# Patient Record
Sex: Female | Born: 1935 | Race: White | Hispanic: No | Smoking: Former smoker
Health system: Southern US, Community
[De-identification: ages and names within clinical notes are randomized; demographics above are authoritative.]

## PROBLEM LIST (undated history)

## (undated) DIAGNOSIS — Z8673 Personal history of transient ischemic attack (TIA), and cerebral infarction without residual deficits: Secondary | ICD-10-CM

## (undated) DIAGNOSIS — I6529 Occlusion and stenosis of unspecified carotid artery: Secondary | ICD-10-CM

## (undated) DIAGNOSIS — K219 Gastro-esophageal reflux disease without esophagitis: Secondary | ICD-10-CM

## (undated) DIAGNOSIS — E785 Hyperlipidemia, unspecified: Secondary | ICD-10-CM

## (undated) DIAGNOSIS — D649 Anemia, unspecified: Secondary | ICD-10-CM

## (undated) DIAGNOSIS — I2510 Atherosclerotic heart disease of native coronary artery without angina pectoris: Secondary | ICD-10-CM

## (undated) DIAGNOSIS — Z952 Presence of prosthetic heart valve: Secondary | ICD-10-CM

## (undated) DIAGNOSIS — I5030 Unspecified diastolic (congestive) heart failure: Secondary | ICD-10-CM

## (undated) DIAGNOSIS — E119 Type 2 diabetes mellitus without complications: Secondary | ICD-10-CM

## (undated) DIAGNOSIS — Z8719 Personal history of other diseases of the digestive system: Secondary | ICD-10-CM

## (undated) DIAGNOSIS — N189 Chronic kidney disease, unspecified: Secondary | ICD-10-CM

## (undated) DIAGNOSIS — I10 Essential (primary) hypertension: Secondary | ICD-10-CM

## (undated) DIAGNOSIS — I739 Peripheral vascular disease, unspecified: Secondary | ICD-10-CM

## (undated) DIAGNOSIS — M797 Fibromyalgia: Secondary | ICD-10-CM

## (undated) DIAGNOSIS — E669 Obesity, unspecified: Secondary | ICD-10-CM

## (undated) DIAGNOSIS — I480 Paroxysmal atrial fibrillation: Secondary | ICD-10-CM

## (undated) HISTORY — DX: Occlusion and stenosis of unspecified carotid artery: I65.29

## (undated) HISTORY — PX: CATARACT EXTRACTION W/ INTRAOCULAR LENS  IMPLANT, BILATERAL: SHX1307

## (undated) HISTORY — DX: Peripheral vascular disease, unspecified: I73.9

## (undated) HISTORY — DX: Obesity, unspecified: E66.9

## (undated) HISTORY — PX: CARPAL TUNNEL RELEASE: SHX101

## (undated) HISTORY — DX: Atherosclerotic heart disease of native coronary artery without angina pectoris: I25.10

## (undated) HISTORY — PX: EYE SURGERY: SHX253

## (undated) HISTORY — DX: Hyperlipidemia, unspecified: E78.5

## (undated) HISTORY — DX: Essential (primary) hypertension: I10

---

## 1994-04-19 HISTORY — PX: TRIGGER FINGER RELEASE: SHX641

## 1997-11-06 ENCOUNTER — Other Ambulatory Visit: Admission: RE | Admit: 1997-11-06 | Discharge: 1997-11-06 | Payer: Self-pay | Admitting: Family Medicine

## 1997-12-25 ENCOUNTER — Ambulatory Visit (HOSPITAL_COMMUNITY): Admission: RE | Admit: 1997-12-25 | Discharge: 1997-12-25 | Payer: Self-pay | Admitting: Family Medicine

## 1997-12-25 ENCOUNTER — Encounter: Payer: Self-pay | Admitting: Family Medicine

## 1998-04-14 ENCOUNTER — Ambulatory Visit (HOSPITAL_COMMUNITY): Admission: RE | Admit: 1998-04-14 | Discharge: 1998-04-14 | Payer: Self-pay | Admitting: Family Medicine

## 1998-04-19 HISTORY — PX: CORONARY ARTERY BYPASS GRAFT: SHX141

## 1998-10-23 ENCOUNTER — Other Ambulatory Visit: Admission: RE | Admit: 1998-10-23 | Discharge: 1998-10-23 | Payer: Self-pay | Admitting: Family Medicine

## 1998-11-06 ENCOUNTER — Ambulatory Visit (HOSPITAL_COMMUNITY): Admission: RE | Admit: 1998-11-06 | Discharge: 1998-11-06 | Payer: Self-pay | Admitting: Internal Medicine

## 1998-11-13 ENCOUNTER — Ambulatory Visit (HOSPITAL_COMMUNITY): Admission: RE | Admit: 1998-11-13 | Discharge: 1998-11-13 | Payer: Self-pay | Admitting: Cardiology

## 1998-11-20 ENCOUNTER — Inpatient Hospital Stay (HOSPITAL_COMMUNITY): Admission: RE | Admit: 1998-11-20 | Discharge: 1998-11-27 | Payer: Self-pay | Admitting: Surgery

## 1998-11-20 DIAGNOSIS — Z951 Presence of aortocoronary bypass graft: Secondary | ICD-10-CM

## 1998-11-21 ENCOUNTER — Encounter: Payer: Self-pay | Admitting: Surgery

## 1998-11-22 ENCOUNTER — Encounter: Payer: Self-pay | Admitting: Surgery

## 1999-01-06 ENCOUNTER — Encounter (HOSPITAL_COMMUNITY): Admission: RE | Admit: 1999-01-06 | Discharge: 1999-04-06 | Payer: Self-pay | Admitting: Cardiology

## 1999-11-09 ENCOUNTER — Ambulatory Visit (HOSPITAL_COMMUNITY): Admission: RE | Admit: 1999-11-09 | Discharge: 1999-11-09 | Payer: Self-pay | Admitting: *Deleted

## 2000-02-05 ENCOUNTER — Encounter: Payer: Self-pay | Admitting: Gastroenterology

## 2000-02-05 ENCOUNTER — Ambulatory Visit (HOSPITAL_COMMUNITY): Admission: RE | Admit: 2000-02-05 | Discharge: 2000-02-05 | Payer: Self-pay | Admitting: Gastroenterology

## 2000-11-29 ENCOUNTER — Other Ambulatory Visit: Admission: RE | Admit: 2000-11-29 | Discharge: 2000-11-29 | Payer: Self-pay | Admitting: Family Medicine

## 2000-12-27 ENCOUNTER — Ambulatory Visit (HOSPITAL_COMMUNITY): Admission: RE | Admit: 2000-12-27 | Discharge: 2000-12-27 | Payer: Self-pay | Admitting: Family Medicine

## 2001-01-25 ENCOUNTER — Ambulatory Visit (HOSPITAL_BASED_OUTPATIENT_CLINIC_OR_DEPARTMENT_OTHER): Admission: RE | Admit: 2001-01-25 | Discharge: 2001-01-25 | Payer: Self-pay | Admitting: Otolaryngology

## 2001-05-05 ENCOUNTER — Ambulatory Visit (HOSPITAL_COMMUNITY): Admission: RE | Admit: 2001-05-05 | Discharge: 2001-05-05 | Payer: Self-pay | Admitting: Family Medicine

## 2001-05-05 ENCOUNTER — Encounter: Payer: Self-pay | Admitting: Family Medicine

## 2001-10-03 ENCOUNTER — Ambulatory Visit (HOSPITAL_COMMUNITY): Admission: RE | Admit: 2001-10-03 | Discharge: 2001-10-03 | Payer: Self-pay | Admitting: Family Medicine

## 2001-10-03 ENCOUNTER — Emergency Department (HOSPITAL_COMMUNITY): Admission: EM | Admit: 2001-10-03 | Discharge: 2001-10-03 | Payer: Self-pay | Admitting: Emergency Medicine

## 2001-10-03 ENCOUNTER — Encounter: Payer: Self-pay | Admitting: Family Medicine

## 2001-11-30 ENCOUNTER — Encounter: Payer: Self-pay | Admitting: Family Medicine

## 2001-11-30 ENCOUNTER — Ambulatory Visit (HOSPITAL_COMMUNITY): Admission: RE | Admit: 2001-11-30 | Discharge: 2001-11-30 | Payer: Self-pay | Admitting: Family Medicine

## 2002-03-05 ENCOUNTER — Ambulatory Visit (HOSPITAL_COMMUNITY): Admission: RE | Admit: 2002-03-05 | Discharge: 2002-03-05 | Payer: Self-pay | Admitting: Internal Medicine

## 2002-11-28 ENCOUNTER — Other Ambulatory Visit: Admission: RE | Admit: 2002-11-28 | Discharge: 2002-11-28 | Payer: Self-pay | Admitting: Family Medicine

## 2003-03-07 ENCOUNTER — Ambulatory Visit (HOSPITAL_COMMUNITY): Admission: RE | Admit: 2003-03-07 | Discharge: 2003-03-07 | Payer: Self-pay | Admitting: Family Medicine

## 2003-12-30 ENCOUNTER — Ambulatory Visit: Payer: Self-pay | Admitting: Nurse Practitioner

## 2004-01-06 ENCOUNTER — Ambulatory Visit: Payer: Self-pay | Admitting: Nurse Practitioner

## 2004-01-29 ENCOUNTER — Ambulatory Visit: Payer: Self-pay | Admitting: Nurse Practitioner

## 2004-04-17 ENCOUNTER — Ambulatory Visit: Payer: Self-pay | Admitting: Nurse Practitioner

## 2004-04-29 ENCOUNTER — Ambulatory Visit (HOSPITAL_COMMUNITY): Admission: RE | Admit: 2004-04-29 | Discharge: 2004-04-29 | Payer: Self-pay | Admitting: Family Medicine

## 2004-05-11 ENCOUNTER — Ambulatory Visit: Payer: Self-pay | Admitting: Nurse Practitioner

## 2004-05-22 ENCOUNTER — Ambulatory Visit: Payer: Self-pay | Admitting: Nurse Practitioner

## 2004-08-11 ENCOUNTER — Ambulatory Visit: Payer: Self-pay | Admitting: Nurse Practitioner

## 2004-09-18 ENCOUNTER — Ambulatory Visit: Payer: Self-pay | Admitting: Nurse Practitioner

## 2004-12-14 ENCOUNTER — Ambulatory Visit (HOSPITAL_COMMUNITY): Admission: RE | Admit: 2004-12-14 | Discharge: 2004-12-14 | Payer: Self-pay | Admitting: Nurse Practitioner

## 2004-12-14 ENCOUNTER — Ambulatory Visit: Payer: Self-pay | Admitting: Nurse Practitioner

## 2004-12-25 ENCOUNTER — Ambulatory Visit: Payer: Self-pay | Admitting: Nurse Practitioner

## 2005-03-24 ENCOUNTER — Ambulatory Visit: Payer: Self-pay | Admitting: Nurse Practitioner

## 2005-04-30 ENCOUNTER — Ambulatory Visit: Payer: Self-pay | Admitting: Nurse Practitioner

## 2005-05-24 ENCOUNTER — Ambulatory Visit: Payer: Self-pay | Admitting: Nurse Practitioner

## 2005-06-07 ENCOUNTER — Ambulatory Visit (HOSPITAL_COMMUNITY): Admission: RE | Admit: 2005-06-07 | Discharge: 2005-06-07 | Payer: Self-pay | Admitting: Family Medicine

## 2005-06-22 ENCOUNTER — Ambulatory Visit: Payer: Self-pay | Admitting: Nurse Practitioner

## 2005-07-28 ENCOUNTER — Ambulatory Visit: Payer: Self-pay | Admitting: Nurse Practitioner

## 2005-09-28 ENCOUNTER — Ambulatory Visit: Payer: Self-pay | Admitting: Nurse Practitioner

## 2005-11-03 ENCOUNTER — Emergency Department (HOSPITAL_COMMUNITY): Admission: EM | Admit: 2005-11-03 | Discharge: 2005-11-03 | Payer: Self-pay | Admitting: Emergency Medicine

## 2005-12-28 ENCOUNTER — Ambulatory Visit: Payer: Self-pay | Admitting: Nurse Practitioner

## 2006-04-20 ENCOUNTER — Ambulatory Visit: Payer: Self-pay | Admitting: Nurse Practitioner

## 2006-05-26 ENCOUNTER — Ambulatory Visit (HOSPITAL_COMMUNITY): Admission: RE | Admit: 2006-05-26 | Discharge: 2006-05-26 | Payer: Self-pay | Admitting: Nurse Practitioner

## 2006-05-26 ENCOUNTER — Ambulatory Visit: Payer: Self-pay | Admitting: Nurse Practitioner

## 2006-12-29 ENCOUNTER — Ambulatory Visit: Payer: Self-pay | Admitting: Internal Medicine

## 2006-12-29 LAB — CONVERTED CEMR LAB
ALT: 37 units/L — ABNORMAL HIGH (ref 0–35)
AST: 43 units/L — ABNORMAL HIGH (ref 0–37)
Albumin: 4.2 g/dL (ref 3.5–5.2)
Alkaline Phosphatase: 121 units/L — ABNORMAL HIGH (ref 39–117)
BUN: 25 mg/dL — ABNORMAL HIGH (ref 6–23)
Basophils Absolute: 0.1 10*3/uL (ref 0.0–0.1)
Basophils Relative: 1 % (ref 0–1)
CO2: 25 meq/L (ref 19–32)
Calcium: 9.5 mg/dL (ref 8.4–10.5)
Chloride: 99 meq/L (ref 96–112)
Creatinine, Ser: 1.01 mg/dL (ref 0.40–1.20)
Eosinophils Absolute: 0.3 10*3/uL (ref 0.0–0.7)
Eosinophils Relative: 4 % (ref 0–5)
Glucose, Bld: 227 mg/dL — ABNORMAL HIGH (ref 70–99)
HCT: 43.3 % (ref 36.0–46.0)
Hemoglobin: 15 g/dL (ref 12.0–15.0)
Lymphocytes Relative: 23 % (ref 12–46)
Lymphs Abs: 1.7 10*3/uL (ref 0.7–3.3)
MCHC: 34.6 g/dL (ref 30.0–36.0)
MCV: 94.5 fL (ref 78.0–100.0)
Monocytes Absolute: 0.7 10*3/uL (ref 0.2–0.7)
Monocytes Relative: 9 % (ref 3–11)
Neutro Abs: 4.8 10*3/uL (ref 1.7–7.7)
Neutrophils Relative %: 64 % (ref 43–77)
Platelets: 271 10*3/uL (ref 150–400)
Potassium: 4.6 meq/L (ref 3.5–5.3)
RBC: 4.58 M/uL (ref 3.87–5.11)
RDW: 13 % (ref 11.5–14.0)
Sodium: 135 meq/L (ref 135–145)
TSH: 2.146 microintl units/mL (ref 0.350–5.50)
Total Bilirubin: 0.8 mg/dL (ref 0.3–1.2)
Total Protein: 8.1 g/dL (ref 6.0–8.3)
WBC: 7.5 10*3/uL (ref 4.0–10.5)

## 2007-01-06 ENCOUNTER — Ambulatory Visit: Payer: Self-pay | Admitting: Family Medicine

## 2007-02-27 ENCOUNTER — Ambulatory Visit: Payer: Self-pay | Admitting: Family Medicine

## 2007-02-28 ENCOUNTER — Ambulatory Visit (HOSPITAL_COMMUNITY): Admission: RE | Admit: 2007-02-28 | Discharge: 2007-02-28 | Payer: Self-pay | Admitting: Family Medicine

## 2007-03-22 ENCOUNTER — Ambulatory Visit: Payer: Self-pay | Admitting: Family Medicine

## 2007-03-24 ENCOUNTER — Ambulatory Visit: Payer: Self-pay | Admitting: Internal Medicine

## 2007-04-18 ENCOUNTER — Ambulatory Visit: Payer: Self-pay | Admitting: Internal Medicine

## 2007-07-07 ENCOUNTER — Ambulatory Visit: Payer: Self-pay | Admitting: Family Medicine

## 2007-07-18 ENCOUNTER — Ambulatory Visit: Payer: Self-pay | Admitting: Internal Medicine

## 2007-08-03 ENCOUNTER — Ambulatory Visit: Payer: Self-pay | Admitting: Internal Medicine

## 2008-02-01 ENCOUNTER — Encounter (INDEPENDENT_AMBULATORY_CARE_PROVIDER_SITE_OTHER): Payer: Self-pay | Admitting: Internal Medicine

## 2008-02-01 ENCOUNTER — Ambulatory Visit: Payer: Self-pay | Admitting: Internal Medicine

## 2008-02-01 LAB — CONVERTED CEMR LAB
ALT: 31 units/L (ref 0–35)
AST: 39 units/L — ABNORMAL HIGH (ref 0–37)
Albumin: 3.8 g/dL (ref 3.5–5.2)
Alkaline Phosphatase: 90 units/L (ref 39–117)
BUN: 22 mg/dL (ref 6–23)
CO2: 27 meq/L (ref 19–32)
Calcium: 9.4 mg/dL (ref 8.4–10.5)
Chloride: 104 meq/L (ref 96–112)
Creatinine, Ser: 0.84 mg/dL (ref 0.40–1.20)
Glucose, Bld: 90 mg/dL (ref 70–99)
Microalb, Ur: 0.3 mg/dL (ref 0.00–1.89)
Potassium: 4.5 meq/L (ref 3.5–5.3)
Sodium: 139 meq/L (ref 135–145)
Total Bilirubin: 0.6 mg/dL (ref 0.3–1.2)
Total Protein: 7.6 g/dL (ref 6.0–8.3)

## 2008-02-02 ENCOUNTER — Encounter (INDEPENDENT_AMBULATORY_CARE_PROVIDER_SITE_OTHER): Payer: Self-pay | Admitting: Internal Medicine

## 2008-02-13 ENCOUNTER — Ambulatory Visit: Payer: Self-pay | Admitting: Internal Medicine

## 2008-03-22 ENCOUNTER — Ambulatory Visit: Payer: Self-pay | Admitting: Family Medicine

## 2008-04-03 ENCOUNTER — Ambulatory Visit: Payer: Self-pay | Admitting: Internal Medicine

## 2008-04-26 ENCOUNTER — Ambulatory Visit: Payer: Self-pay | Admitting: Internal Medicine

## 2008-06-03 ENCOUNTER — Ambulatory Visit: Payer: Self-pay | Admitting: Internal Medicine

## 2008-08-06 ENCOUNTER — Ambulatory Visit: Payer: Self-pay | Admitting: Internal Medicine

## 2008-08-07 ENCOUNTER — Encounter (INDEPENDENT_AMBULATORY_CARE_PROVIDER_SITE_OTHER): Payer: Self-pay | Admitting: Internal Medicine

## 2008-08-09 ENCOUNTER — Encounter: Admission: RE | Admit: 2008-08-09 | Discharge: 2008-08-09 | Payer: Self-pay | Admitting: Cardiology

## 2008-08-13 ENCOUNTER — Ambulatory Visit (HOSPITAL_COMMUNITY): Admission: RE | Admit: 2008-08-13 | Discharge: 2008-08-13 | Payer: Self-pay | Admitting: Internal Medicine

## 2008-08-20 ENCOUNTER — Ambulatory Visit: Payer: Self-pay | Admitting: Internal Medicine

## 2008-09-23 ENCOUNTER — Encounter: Payer: Self-pay | Admitting: Internal Medicine

## 2008-09-23 ENCOUNTER — Ambulatory Visit (HOSPITAL_COMMUNITY): Admission: RE | Admit: 2008-09-23 | Discharge: 2008-09-23 | Payer: Self-pay | Admitting: Internal Medicine

## 2008-09-23 ENCOUNTER — Ambulatory Visit: Payer: Self-pay | Admitting: Internal Medicine

## 2008-09-23 DIAGNOSIS — M79609 Pain in unspecified limb: Secondary | ICD-10-CM

## 2008-09-23 LAB — CONVERTED CEMR LAB: Blood Glucose, Fingerstick: 253

## 2008-10-18 ENCOUNTER — Ambulatory Visit: Payer: Self-pay | Admitting: Internal Medicine

## 2008-10-18 ENCOUNTER — Encounter (INDEPENDENT_AMBULATORY_CARE_PROVIDER_SITE_OTHER): Payer: Self-pay | Admitting: Internal Medicine

## 2008-12-05 ENCOUNTER — Encounter (INDEPENDENT_AMBULATORY_CARE_PROVIDER_SITE_OTHER): Payer: Self-pay | Admitting: Internal Medicine

## 2008-12-05 ENCOUNTER — Ambulatory Visit: Payer: Self-pay | Admitting: Internal Medicine

## 2008-12-05 LAB — CONVERTED CEMR LAB
Albumin: 3.9 g/dL (ref 3.5–5.2)
BUN: 15 mg/dL (ref 6–23)
CO2: 26 meq/L (ref 19–32)
Glucose, Bld: 87 mg/dL (ref 70–99)
Sodium: 141 meq/L (ref 135–145)
Total Bilirubin: 0.6 mg/dL (ref 0.3–1.2)
Total Protein: 7.5 g/dL (ref 6.0–8.3)

## 2008-12-09 ENCOUNTER — Telehealth (INDEPENDENT_AMBULATORY_CARE_PROVIDER_SITE_OTHER): Payer: Self-pay | Admitting: *Deleted

## 2008-12-11 ENCOUNTER — Ambulatory Visit: Payer: Self-pay | Admitting: Internal Medicine

## 2008-12-24 ENCOUNTER — Ambulatory Visit: Payer: Self-pay | Admitting: Internal Medicine

## 2009-01-07 ENCOUNTER — Ambulatory Visit: Payer: Self-pay | Admitting: Internal Medicine

## 2009-01-21 ENCOUNTER — Ambulatory Visit: Payer: Self-pay | Admitting: Internal Medicine

## 2009-02-11 ENCOUNTER — Encounter (INDEPENDENT_AMBULATORY_CARE_PROVIDER_SITE_OTHER): Payer: Self-pay | Admitting: Internal Medicine

## 2009-02-11 ENCOUNTER — Ambulatory Visit: Payer: Self-pay | Admitting: Internal Medicine

## 2009-02-11 LAB — CONVERTED CEMR LAB: Microalb, Ur: 0.54 mg/dL (ref 0.00–1.89)

## 2009-02-12 ENCOUNTER — Encounter (INDEPENDENT_AMBULATORY_CARE_PROVIDER_SITE_OTHER): Payer: Self-pay | Admitting: Internal Medicine

## 2009-03-11 ENCOUNTER — Ambulatory Visit: Payer: Self-pay | Admitting: Internal Medicine

## 2009-04-06 ENCOUNTER — Emergency Department (HOSPITAL_COMMUNITY): Admission: EM | Admit: 2009-04-06 | Discharge: 2009-04-06 | Payer: Self-pay | Admitting: Emergency Medicine

## 2009-04-21 ENCOUNTER — Inpatient Hospital Stay (HOSPITAL_COMMUNITY): Admission: EM | Admit: 2009-04-21 | Discharge: 2009-04-26 | Payer: Self-pay | Admitting: Emergency Medicine

## 2009-04-21 DIAGNOSIS — Z8719 Personal history of other diseases of the digestive system: Secondary | ICD-10-CM

## 2009-05-14 ENCOUNTER — Ambulatory Visit: Payer: Self-pay | Admitting: Internal Medicine

## 2009-05-20 ENCOUNTER — Ambulatory Visit: Payer: Self-pay | Admitting: Internal Medicine

## 2009-05-20 ENCOUNTER — Encounter (INDEPENDENT_AMBULATORY_CARE_PROVIDER_SITE_OTHER): Payer: Self-pay | Admitting: Adult Health

## 2009-05-20 ENCOUNTER — Encounter: Payer: Self-pay | Admitting: Internal Medicine

## 2009-05-20 ENCOUNTER — Ambulatory Visit (HOSPITAL_COMMUNITY): Admission: RE | Admit: 2009-05-20 | Discharge: 2009-05-20 | Payer: Self-pay | Admitting: Internal Medicine

## 2009-05-20 ENCOUNTER — Ambulatory Visit: Payer: Self-pay | Admitting: Vascular Surgery

## 2009-05-20 LAB — CONVERTED CEMR LAB
ALT: 14 units/L (ref 0–35)
AST: 21 units/L (ref 0–37)
Albumin: 3.5 g/dL (ref 3.5–5.2)
Calcium: 9.4 mg/dL (ref 8.4–10.5)
Chloride: 100 meq/L (ref 96–112)
HCT: 38.4 % (ref 36.0–46.0)
Lymphocytes Relative: 17 % (ref 12–46)
Lymphs Abs: 1.4 10*3/uL (ref 0.7–4.0)
Monocytes Relative: 8 % (ref 3–12)
Neutrophils Relative %: 70 % (ref 43–77)
Platelets: 406 10*3/uL — ABNORMAL HIGH (ref 150–400)
Potassium: 4.5 meq/L (ref 3.5–5.3)
RBC: 3.88 M/uL (ref 3.87–5.11)
Total Protein: 7 g/dL (ref 6.0–8.3)
WBC: 8.4 10*3/uL (ref 4.0–10.5)

## 2009-05-23 ENCOUNTER — Emergency Department (HOSPITAL_COMMUNITY): Admission: EM | Admit: 2009-05-23 | Discharge: 2009-05-23 | Payer: Self-pay | Admitting: Emergency Medicine

## 2009-06-24 ENCOUNTER — Ambulatory Visit: Payer: Self-pay | Admitting: Internal Medicine

## 2009-06-26 ENCOUNTER — Ambulatory Visit: Payer: Self-pay | Admitting: Vascular Surgery

## 2009-06-26 ENCOUNTER — Ambulatory Visit (HOSPITAL_COMMUNITY): Admission: RE | Admit: 2009-06-26 | Discharge: 2009-06-26 | Payer: Self-pay | Admitting: Internal Medicine

## 2009-06-26 ENCOUNTER — Encounter: Payer: Self-pay | Admitting: Internal Medicine

## 2009-07-18 ENCOUNTER — Ambulatory Visit: Payer: Self-pay | Admitting: Internal Medicine

## 2009-07-19 ENCOUNTER — Encounter (INDEPENDENT_AMBULATORY_CARE_PROVIDER_SITE_OTHER): Payer: Self-pay | Admitting: Adult Health

## 2009-08-06 ENCOUNTER — Ambulatory Visit: Payer: Self-pay | Admitting: Internal Medicine

## 2009-08-06 ENCOUNTER — Encounter (INDEPENDENT_AMBULATORY_CARE_PROVIDER_SITE_OTHER): Payer: Self-pay | Admitting: Adult Health

## 2009-08-06 ENCOUNTER — Other Ambulatory Visit: Admission: RE | Admit: 2009-08-06 | Discharge: 2009-08-06 | Payer: Self-pay | Admitting: Internal Medicine

## 2009-08-15 ENCOUNTER — Ambulatory Visit (HOSPITAL_COMMUNITY): Admission: RE | Admit: 2009-08-15 | Discharge: 2009-08-15 | Payer: Self-pay | Admitting: Internal Medicine

## 2009-08-17 ENCOUNTER — Emergency Department (HOSPITAL_COMMUNITY): Admission: EM | Admit: 2009-08-17 | Discharge: 2009-08-17 | Payer: Self-pay | Admitting: Emergency Medicine

## 2009-08-20 ENCOUNTER — Ambulatory Visit: Payer: Self-pay | Admitting: Vascular Surgery

## 2009-08-27 ENCOUNTER — Ambulatory Visit: Payer: Self-pay | Admitting: Internal Medicine

## 2009-08-28 ENCOUNTER — Encounter (INDEPENDENT_AMBULATORY_CARE_PROVIDER_SITE_OTHER): Payer: Self-pay | Admitting: Internal Medicine

## 2009-11-05 ENCOUNTER — Ambulatory Visit: Payer: Self-pay | Admitting: Internal Medicine

## 2009-11-05 ENCOUNTER — Encounter (INDEPENDENT_AMBULATORY_CARE_PROVIDER_SITE_OTHER): Payer: Self-pay | Admitting: Adult Health

## 2010-04-09 ENCOUNTER — Ambulatory Visit: Payer: Self-pay | Admitting: Vascular Surgery

## 2010-05-09 ENCOUNTER — Encounter: Payer: Self-pay | Admitting: Ophthalmology

## 2010-07-05 LAB — CBC
HCT: 23.5 % — ABNORMAL LOW (ref 36.0–46.0)
HCT: 24.5 % — ABNORMAL LOW (ref 36.0–46.0)
HCT: 24.6 % — ABNORMAL LOW (ref 36.0–46.0)
HCT: 24.7 % — ABNORMAL LOW (ref 36.0–46.0)
HCT: 24.9 % — ABNORMAL LOW (ref 36.0–46.0)
Hemoglobin: 7.1 g/dL — ABNORMAL LOW (ref 12.0–15.0)
Hemoglobin: 8 g/dL — ABNORMAL LOW (ref 12.0–15.0)
Hemoglobin: 8.4 g/dL — ABNORMAL LOW (ref 12.0–15.0)
Hemoglobin: 8.4 g/dL — ABNORMAL LOW (ref 12.0–15.0)
Hemoglobin: 8.5 g/dL — ABNORMAL LOW (ref 12.0–15.0)
Hemoglobin: 8.7 g/dL — ABNORMAL LOW (ref 12.0–15.0)
Hemoglobin: 9.1 g/dL — ABNORMAL LOW (ref 12.0–15.0)
MCHC: 33.9 g/dL (ref 30.0–36.0)
MCHC: 34.4 g/dL (ref 30.0–36.0)
MCHC: 34.4 g/dL (ref 30.0–36.0)
MCHC: 34.4 g/dL (ref 30.0–36.0)
MCHC: 34.5 g/dL (ref 30.0–36.0)
MCHC: 34.5 g/dL (ref 30.0–36.0)
MCHC: 34.5 g/dL (ref 30.0–36.0)
MCHC: 34.7 g/dL (ref 30.0–36.0)
MCHC: 34.9 g/dL (ref 30.0–36.0)
MCV: 93.5 fL (ref 78.0–100.0)
MCV: 94.9 fL (ref 78.0–100.0)
MCV: 95.3 fL (ref 78.0–100.0)
MCV: 95.6 fL (ref 78.0–100.0)
MCV: 95.6 fL (ref 78.0–100.0)
MCV: 95.8 fL (ref 78.0–100.0)
MCV: 96.4 fL (ref 78.0–100.0)
MCV: 97 fL (ref 78.0–100.0)
Platelets: 384 10*3/uL (ref 150–400)
Platelets: 397 K/uL (ref 150–400)
Platelets: 399 K/uL (ref 150–400)
Platelets: 405 K/uL — ABNORMAL HIGH (ref 150–400)
Platelets: 409 10*3/uL — ABNORMAL HIGH (ref 150–400)
Platelets: 422 K/uL — ABNORMAL HIGH (ref 150–400)
Platelets: 476 10*3/uL — ABNORMAL HIGH (ref 150–400)
RBC: 2.1 MIL/uL — ABNORMAL LOW (ref 3.87–5.11)
RBC: 2.42 MIL/uL — ABNORMAL LOW (ref 3.87–5.11)
RBC: 2.57 MIL/uL — ABNORMAL LOW (ref 3.87–5.11)
RBC: 2.6 MIL/uL — ABNORMAL LOW (ref 3.87–5.11)
RBC: 2.67 MIL/uL — ABNORMAL LOW (ref 3.87–5.11)
RBC: 2.73 MIL/uL — ABNORMAL LOW (ref 3.87–5.11)
RDW: 14.8 % (ref 11.5–15.5)
RDW: 14.9 % (ref 11.5–15.5)
RDW: 15 % (ref 11.5–15.5)
RDW: 15.4 % (ref 11.5–15.5)
RDW: 15.4 % (ref 11.5–15.5)
RDW: 17.5 % — ABNORMAL HIGH (ref 11.5–15.5)
WBC: 7.5 10*3/uL (ref 4.0–10.5)
WBC: 7.9 K/uL (ref 4.0–10.5)
WBC: 8.3 K/uL (ref 4.0–10.5)
WBC: 8.8 10*3/uL (ref 4.0–10.5)
WBC: 8.8 10*3/uL (ref 4.0–10.5)
WBC: 8.8 K/uL (ref 4.0–10.5)
WBC: 9.1 K/uL (ref 4.0–10.5)
WBC: 9.5 10*3/uL (ref 4.0–10.5)

## 2010-07-05 LAB — GLUCOSE, CAPILLARY
Glucose-Capillary: 101 mg/dL — ABNORMAL HIGH (ref 70–99)
Glucose-Capillary: 111 mg/dL — ABNORMAL HIGH (ref 70–99)
Glucose-Capillary: 123 mg/dL — ABNORMAL HIGH (ref 70–99)
Glucose-Capillary: 152 mg/dL — ABNORMAL HIGH (ref 70–99)
Glucose-Capillary: 152 mg/dL — ABNORMAL HIGH (ref 70–99)
Glucose-Capillary: 160 mg/dL — ABNORMAL HIGH (ref 70–99)
Glucose-Capillary: 170 mg/dL — ABNORMAL HIGH (ref 70–99)
Glucose-Capillary: 171 mg/dL — ABNORMAL HIGH (ref 70–99)
Glucose-Capillary: 180 mg/dL — ABNORMAL HIGH (ref 70–99)
Glucose-Capillary: 217 mg/dL — ABNORMAL HIGH (ref 70–99)
Glucose-Capillary: 220 mg/dL — ABNORMAL HIGH (ref 70–99)
Glucose-Capillary: 227 mg/dL — ABNORMAL HIGH (ref 70–99)
Glucose-Capillary: 274 mg/dL — ABNORMAL HIGH (ref 70–99)
Glucose-Capillary: 297 mg/dL — ABNORMAL HIGH (ref 70–99)
Glucose-Capillary: 74 mg/dL (ref 70–99)

## 2010-07-05 LAB — COMPREHENSIVE METABOLIC PANEL
AST: 29 U/L (ref 0–37)
Albumin: 2.8 g/dL — ABNORMAL LOW (ref 3.5–5.2)
Calcium: 8.9 mg/dL (ref 8.4–10.5)
Chloride: 102 mEq/L (ref 96–112)
Creatinine, Ser: 0.77 mg/dL (ref 0.4–1.2)
GFR calc Af Amer: >60 mL/min (ref >60)

## 2010-07-05 LAB — DIFFERENTIAL
Eosinophils Relative: 2 % (ref 0–5)
Lymphocytes Relative: 16 % (ref 12–46)
Lymphs Abs: 1.5 10*3/uL (ref 0.7–4.0)
Monocytes Absolute: 0.5 10*3/uL (ref 0.1–1.0)

## 2010-07-05 LAB — PROTIME-INR
INR: 1.4 (ref 0.00–1.49)
INR: 1.48 (ref 0.00–1.49)
Prothrombin Time: 17 s — ABNORMAL HIGH (ref 11.6–15.2)
Prothrombin Time: 17.8 seconds — ABNORMAL HIGH (ref 11.6–15.2)

## 2010-07-05 LAB — CROSSMATCH
ABO/RH(D): A POS
Antibody Screen: NEGATIVE

## 2010-07-05 LAB — BASIC METABOLIC PANEL
BUN: 23 mg/dL (ref 6–23)
CO2: 26 mEq/L (ref 19–32)
Calcium: 8.5 mg/dL (ref 8.4–10.5)
Calcium: 8.8 mg/dL (ref 8.4–10.5)
Chloride: 103 mEq/L (ref 96–112)
Chloride: 104 mEq/L (ref 96–112)
Creatinine, Ser: 0.81 mg/dL (ref 0.4–1.2)
GFR calc Af Amer: >60 mL/min (ref >60)
Sodium: 135 mEq/L (ref 135–145)

## 2010-07-05 LAB — PREPARE FRESH FROZEN PLASMA

## 2010-07-05 LAB — HEMOGLOBIN AND HEMATOCRIT, BLOOD: Hemoglobin: 8.6 g/dL — ABNORMAL LOW (ref 12.0–15.0)

## 2010-07-05 LAB — HEMOCCULT GUIAC POC 1CARD (OFFICE)
Fecal Occult Bld: POSITIVE
Fecal Occult Bld: POSITIVE

## 2010-07-05 LAB — BRAIN NATRIURETIC PEPTIDE: Pro B Natriuretic peptide (BNP): 89 pg/mL (ref 0.0–100.0)

## 2010-07-05 LAB — HEMOGLOBIN A1C: Mean Plasma Glucose: 169 mg/dL

## 2010-07-07 LAB — URINE CULTURE: Colony Count: >=100000

## 2010-07-07 LAB — URINALYSIS, ROUTINE W REFLEX MICROSCOPIC
Bilirubin Urine: NEGATIVE
Nitrite: POSITIVE — AB
Specific Gravity, Urine: 1.018 (ref 1.005–1.030)
pH: 6 (ref 5.0–8.0)

## 2010-07-07 LAB — URINE MICROSCOPIC-ADD ON

## 2010-07-07 LAB — GLUCOSE, CAPILLARY: Glucose-Capillary: 273 mg/dL — ABNORMAL HIGH (ref 70–99)

## 2010-07-14 ENCOUNTER — Emergency Department (HOSPITAL_COMMUNITY): Payer: Medicare Other

## 2010-07-14 ENCOUNTER — Emergency Department (HOSPITAL_COMMUNITY)
Admission: EM | Admit: 2010-07-14 | Discharge: 2010-07-14 | Disposition: A | Discharge status: Home / Self Care | Payer: Medicare Other | Attending: Emergency Medicine | Admitting: Emergency Medicine

## 2010-07-14 DIAGNOSIS — I252 Old myocardial infarction: Secondary | ICD-10-CM | POA: Insufficient documentation

## 2010-07-14 DIAGNOSIS — M5430 Sciatica, unspecified side: Secondary | ICD-10-CM | POA: Insufficient documentation

## 2010-07-14 DIAGNOSIS — I25810 Atherosclerosis of coronary artery bypass graft(s) without angina pectoris: Secondary | ICD-10-CM | POA: Insufficient documentation

## 2010-07-14 DIAGNOSIS — M25559 Pain in unspecified hip: Secondary | ICD-10-CM | POA: Insufficient documentation

## 2010-07-14 DIAGNOSIS — E785 Hyperlipidemia, unspecified: Secondary | ICD-10-CM | POA: Insufficient documentation

## 2010-07-14 DIAGNOSIS — E119 Type 2 diabetes mellitus without complications: Secondary | ICD-10-CM | POA: Insufficient documentation

## 2010-07-14 DIAGNOSIS — I10 Essential (primary) hypertension: Secondary | ICD-10-CM | POA: Insufficient documentation

## 2010-07-14 DIAGNOSIS — Z794 Long term (current) use of insulin: Secondary | ICD-10-CM | POA: Insufficient documentation

## 2010-07-20 LAB — CBC
HCT: 40.9 % (ref 36.0–46.0)
Hemoglobin: 14.1 g/dL (ref 12.0–15.0)
RDW: 12.9 % (ref 11.5–15.5)

## 2010-07-20 LAB — DIFFERENTIAL
Basophils Absolute: 0 10*3/uL (ref 0.0–0.1)
Eosinophils Relative: 3 % (ref 0–5)
Lymphocytes Relative: 13 % (ref 12–46)
Monocytes Absolute: 0.5 10*3/uL (ref 0.1–1.0)
Monocytes Relative: 7 % (ref 3–12)

## 2010-09-01 NOTE — Assessment & Plan Note (Signed)
OFFICE VISIT   Kristina Summers, Kristina Summers  DOB:  02/22/1936                                       08/20/2009  [id number]   The patient is a 75 year old female referred for lower extremity pain.  She states she has been having foot and ankle swelling in her right leg.  She apparently had a cellulitis in this area in March 2011.  This was  treated with antibiotics and has almost resolved at this point.  She has  had no prior episodes of cellulitis.  She also complains of pain in her  lower back which radiates to her right leg after walking approximately 5  minutes.  This has gotten worse over the last 6 months.  She has no  symptoms in her left lower extremity.  She denies any rest pain.  She  denies any ulcerations or nonhealing wounds on the foot.  States the  pain and fatigue is relieved with rest.  She also develops shortness of  breath about the time she develops lower extremity fatigue.   CHRONIC MEDICAL PROBLEMS:  Include diabetes, hypertension, coronary  artery disease, congestive heart failure, and elevated cholesterol.  She  also a history of atrial fibrillation.  These are all currently under  control.  She is followed by Dr. [name] and Dr. Jobe Igo.   PAST SURGICAL HISTORY:  She had coronary artery bypass grafting in 2000.   FAMILY HISTORY:  Remarkable for a brother who had a stroke in the past.   SOCIAL HISTORY:  She is divorced, has 3 children.  She is retired.  She  is a former smoker, quit 1992.  She does not consume alcohol regularly.   REVIEW OF SYSTEMS:  Full 12-point review of systems was performed with  the patient today.  Please see intake referral form for details  regarding this.   MEDICATIONS:  Include calcium, Micardis, omeprazole, oxycodone,  Macrobid, Lasix, Lipitor, aspirin, iron and Humulin insulin.  She was  given a refill for her Lasix prescription today.   PHYSICAL EXAMINATION:  Vital signs:  Blood pressure is 145/78 the left  arm, heart rate 86 and regular, temperature 98.  HEENT:  Unremarkable.  Neck:  Has 2+ carotid pulses without bruit.  Chest:  Clear to  auscultation.  Cardiac:  Exam is regular rate and rhythm without murmur.  Abdomen:  Soft, nontender, nondistended, slightly obese.  Extremities:  She has 2+ right radial pulse, she has a 1+ left radial pulse, she has  2+ femoral pulses bilaterally.  She has absent popliteal and pedal  pulses bilaterally.  Skin:  She still has some right calf erythema  around the area of her previous saphenectomy incision.  This is fairly  faint in appearance and the patient says it is remarkably improved.  Neurologic:  Exam shows no focal weakness or paresthesias.  Upper  extremity and lower extremity motor strength is 5/5 and symmetric.  Musculoskeletal:  Exam shows no obvious major joint deformities.  She  does have some tenderness in her shoulder on the right side and has a  known right rotator cuff tear.   She had bilateral ABIs performed at [facility] March 2011 that  showed an ABI on the right side of 0.8 and on the left of 0.6.  She had  monophasic waveforms bilaterally.   In summary, the patient has  right lower extremity pain.  This probably  multifactorial with degenerative joint disease as well as some chronic  back issues as well as some element of claudication.  Her ABIs on the  right side were 0.8 which suggests some occlusive disease.  Since her  waveforms are monophasic in character, most likely this represents some  element of tibial occlusive disease especially in light of fact that she  has diabetes.  The left ABI was slightly lower at 0.6 but she is  asymptomatic on this side.  I reassured her today that currently she is  not at risk of limb loss with this amount of perfusion to both legs.  I  encouraged her to try to walk 30 minutes daily, try to improve her  exercise tolerance and increase collateralization over time.  I do not  believe she  needs an intervention on her lower extremities at this point  as she is not really debilitated by her lower extremity symptoms and  most likely again these are multifactorial in nature.  I believe she  needs continued followup.  We will reschedule her for repeat ABIs in 6  months' time.  She will return sooner if she has any problems.     Jessy Oto. Fields, MD  Electronically Signed   CEF/MEDQ  D:  08/20/2009  T:  08/20/2009  Job:  [id number]   cc:   Monia Sabal. Jobe Igo, M.D.

## 2010-09-09 ENCOUNTER — Other Ambulatory Visit (HOSPITAL_COMMUNITY): Payer: Self-pay | Admitting: Family Medicine

## 2010-09-09 DIAGNOSIS — Z1231 Encounter for screening mammogram for malignant neoplasm of breast: Secondary | ICD-10-CM

## 2010-09-24 ENCOUNTER — Ambulatory Visit (HOSPITAL_COMMUNITY): Payer: Medicare Other

## 2010-10-07 ENCOUNTER — Ambulatory Visit (HOSPITAL_COMMUNITY)
Admission: RE | Admit: 2010-10-07 | Discharge: 2010-10-07 | Disposition: A | Discharge status: Home / Self Care | Payer: Medicare Other | Source: Ambulatory Visit | Attending: Family Medicine | Admitting: Family Medicine

## 2010-10-07 DIAGNOSIS — Z1231 Encounter for screening mammogram for malignant neoplasm of breast: Secondary | ICD-10-CM | POA: Insufficient documentation

## 2010-10-15 ENCOUNTER — Encounter (INDEPENDENT_AMBULATORY_CARE_PROVIDER_SITE_OTHER): Payer: Medicare Other

## 2010-10-15 DIAGNOSIS — R0989 Other specified symptoms and signs involving the circulatory and respiratory systems: Secondary | ICD-10-CM

## 2011-02-08 ENCOUNTER — Other Ambulatory Visit: Payer: Self-pay

## 2011-02-08 DIAGNOSIS — I70219 Atherosclerosis of native arteries of extremities with intermittent claudication, unspecified extremity: Secondary | ICD-10-CM

## 2011-02-10 ENCOUNTER — Emergency Department (HOSPITAL_COMMUNITY)
Admission: EM | Admit: 2011-02-10 | Discharge: 2011-02-10 | Disposition: A | Discharge status: Home / Self Care | Payer: Medicare Other | Attending: Emergency Medicine | Admitting: Emergency Medicine

## 2011-02-10 DIAGNOSIS — M25519 Pain in unspecified shoulder: Secondary | ICD-10-CM | POA: Insufficient documentation

## 2011-02-10 DIAGNOSIS — M62838 Other muscle spasm: Secondary | ICD-10-CM | POA: Insufficient documentation

## 2011-02-10 DIAGNOSIS — I25810 Atherosclerosis of coronary artery bypass graft(s) without angina pectoris: Secondary | ICD-10-CM | POA: Insufficient documentation

## 2011-02-10 DIAGNOSIS — M542 Cervicalgia: Secondary | ICD-10-CM | POA: Insufficient documentation

## 2011-02-10 DIAGNOSIS — E119 Type 2 diabetes mellitus without complications: Secondary | ICD-10-CM | POA: Insufficient documentation

## 2011-02-10 DIAGNOSIS — Z794 Long term (current) use of insulin: Secondary | ICD-10-CM | POA: Insufficient documentation

## 2011-02-13 ENCOUNTER — Emergency Department (HOSPITAL_COMMUNITY): Payer: Medicare Other

## 2011-02-13 ENCOUNTER — Emergency Department (HOSPITAL_COMMUNITY)
Admission: EM | Admit: 2011-02-13 | Discharge: 2011-02-13 | Disposition: A | Discharge status: Home / Self Care | Payer: Medicare Other | Attending: Emergency Medicine | Admitting: Emergency Medicine

## 2011-02-13 DIAGNOSIS — T148XXA Other injury of unspecified body region, initial encounter: Secondary | ICD-10-CM | POA: Insufficient documentation

## 2011-02-13 DIAGNOSIS — M47812 Spondylosis without myelopathy or radiculopathy, cervical region: Secondary | ICD-10-CM | POA: Insufficient documentation

## 2011-02-13 DIAGNOSIS — E785 Hyperlipidemia, unspecified: Secondary | ICD-10-CM | POA: Insufficient documentation

## 2011-02-13 DIAGNOSIS — E119 Type 2 diabetes mellitus without complications: Secondary | ICD-10-CM | POA: Insufficient documentation

## 2011-02-13 DIAGNOSIS — I4891 Unspecified atrial fibrillation: Secondary | ICD-10-CM | POA: Insufficient documentation

## 2011-02-13 DIAGNOSIS — I10 Essential (primary) hypertension: Secondary | ICD-10-CM | POA: Insufficient documentation

## 2011-02-13 DIAGNOSIS — Z951 Presence of aortocoronary bypass graft: Secondary | ICD-10-CM | POA: Insufficient documentation

## 2011-02-13 DIAGNOSIS — M542 Cervicalgia: Secondary | ICD-10-CM | POA: Insufficient documentation

## 2011-02-13 DIAGNOSIS — M62838 Other muscle spasm: Secondary | ICD-10-CM | POA: Insufficient documentation

## 2011-02-13 DIAGNOSIS — I252 Old myocardial infarction: Secondary | ICD-10-CM | POA: Insufficient documentation

## 2011-02-13 DIAGNOSIS — R51 Headache: Secondary | ICD-10-CM | POA: Insufficient documentation

## 2011-02-13 DIAGNOSIS — I2510 Atherosclerotic heart disease of native coronary artery without angina pectoris: Secondary | ICD-10-CM | POA: Insufficient documentation

## 2011-02-13 DIAGNOSIS — X500XXA Overexertion from strenuous movement or load, initial encounter: Secondary | ICD-10-CM | POA: Insufficient documentation

## 2011-03-04 ENCOUNTER — Encounter: Payer: Self-pay | Admitting: Vascular Surgery

## 2011-03-18 ENCOUNTER — Ambulatory Visit: Payer: Medicare Other | Admitting: Vascular Surgery

## 2011-03-18 ENCOUNTER — Other Ambulatory Visit: Payer: Medicare Other

## 2011-03-25 ENCOUNTER — Encounter: Payer: Self-pay | Admitting: Vascular Surgery

## 2011-03-26 ENCOUNTER — Ambulatory Visit (INDEPENDENT_AMBULATORY_CARE_PROVIDER_SITE_OTHER): Payer: Medicare Other | Admitting: Vascular Surgery

## 2011-03-26 ENCOUNTER — Other Ambulatory Visit (INDEPENDENT_AMBULATORY_CARE_PROVIDER_SITE_OTHER): Payer: Medicare Other | Admitting: *Deleted

## 2011-03-26 ENCOUNTER — Other Ambulatory Visit: Payer: Self-pay

## 2011-03-26 ENCOUNTER — Encounter: Payer: Self-pay | Admitting: Vascular Surgery

## 2011-03-26 VITALS — BP 170/63 | HR 66 | Resp 16 | Ht 65.0 in | Wt 212.0 lb

## 2011-03-26 DIAGNOSIS — I739 Peripheral vascular disease, unspecified: Secondary | ICD-10-CM

## 2011-03-26 DIAGNOSIS — I70219 Atherosclerosis of native arteries of extremities with intermittent claudication, unspecified extremity: Secondary | ICD-10-CM | POA: Insufficient documentation

## 2011-03-26 NOTE — Progress Notes (Signed)
[facility] [place]  Referred by:  Dorna Mai, MD [phone], [place] [address]  Reason for referral: B short distance claudication  History of Present Illness  Kristina Summers is a 75 y.o. female who presents with chief complaint: B calf pain with ambulation.  Onset of symptom occurred roughly one year ago.  Pain is described as aching in calves (R>L), severity 3-6/10, and associated with walking.  Patient notes [place] which interferes with her ability to complete ADLs.  Patient has attempted to treat this pain with rest and OTC rx.  The patient has no rest pain symptoms also and no leg wounds/ulcers.  Atherosclerotic risk factors include: bese, HTN, DM, and hypercholesterol.  Past Medical History  Diagnosis Date  . CAD (coronary artery disease)   . Obesity   . Chest pain   . Atrial fibrillation   . Hypertension   . Myocardial infarction   . Diabetes mellitus   . Hyperlipidemia   . Peripheral vascular disease   . Dizziness   . Carpal tunnel syndrome   . GI bleed 1/11  . Carotid artery occlusion    Past Surgical History  Procedure Date  . Coronary artery bypass graft   . Trigger finger release 1996    Bilateral Thumb releases    History   Social History  . Marital Status: Divorced    Spouse Name: N/A    Number of Children: N/A  . Years of Education: N/A   Occupational History  . Not on file.   Social History Main Topics  . Smoking status: Former Research scientist (life sciences)  . Smokeless tobacco: Not on file  . Alcohol Use: No  . Drug Use: No  . Sexually Active:    Other Topics Concern  . Not on file   Social History Narrative  . No narrative on file    History reviewed. No pertinent family history.  Current Outpatient Prescriptions on File Prior to Visit  Medication Sig Dispense Refill  . ascorbic acid (VITAMIN C) 500 MG tablet Take 500 mg by mouth daily.        Marland Kitchen aspirin EC 81 MG tablet Take 81 mg by  mouth daily.        Marland Kitchen atorvastatin (LIPITOR) 80 MG tablet Take 80 mg by mouth daily.        . Biotin 300 MCG TABS Take 1,000 mcg by mouth daily.        . calcium carbonate (OS-CAL) 1250 MG chewable tablet Chew 1 tablet by mouth daily.        . Cranberry Extract 250 MG TABS Take 250 mg by mouth daily.        . furosemide (LASIX) 40 MG tablet Take 40 mg by mouth 2 (two) times daily.        . insulin NPH-insulin regular (NOVOLIN 70/30) (70-30) 100 UNIT/ML injection Inject 37 Units into the skin daily with breakfast. 33 units in the evening       . losartan (COZAAR) 50 MG tablet Take 50 mg by mouth 2 (two) times daily.        . metoprolol succinate (TOPROL-XL) 25 MG 24 hr tablet Take 25 mg by mouth 2 (two) times daily.        Marland Kitchen omeprazole (PRILOSEC) 40 MG capsule Take 40 mg by mouth daily.          Allergies  Allergen Reactions  . Lisinopril Cough  . Sertraline Hcl  REACTION: nausea     Review of Systems (Positive items checked otherwise negative)  General: [ ]  Weight loss, [x]  Weight gain, [ ]   Loss of appetite, [ ]  Fever  Neurologic: [ ]  Dizziness, [ ]  Blackouts, [ ]  Headaches, [ ]  Seizure  Ear/Nose/Throat: [ ]  Change in eyesight, [ ]  Change in hearing, [ ]  Nose bleeds, [ ]  Sore throat  Vascular: [x]  Pain in legs with walking, [ ]  Pain in feet while lying flat, [ ]  Non-healing ulcer, Stroke, [ ]  "Mini stroke", [ ]  Slurred speech, [ ]  Temporary blindness, [ ]  Blood clot in vein, [x]  Phlebitis  Pulmonary: [ ]  Home oxygen, [ ]  Productive cough, [ ]  Bronchitis, [ ]  Coughing up blood,  [ ]  Asthma, [ ]  Wheezing  Musculoskeletal: [x]  Arthritis, [x]  Joint pain, [ ]  Muscle pain  Cardiac: [ ]  Chest pain, [ ]  Chest tightness/pressure, [ ]  Shortness of breath when lying flat, [ ]  Shortness of breath with exertion, [ ]  Palpitations, [ ]  Heart murmur, [x]  Arrythmia,  [x]  Atrial fibrillation  Hematologic: [ ]  Bleeding problems, [ ]  Clotting disorder, [ ]  Anemia  Psychiatric:  [ ]   Depression, [ ]  Anxiety, [ ]  Attention deficit disorder  Gastrointestinal:  [ ]  Black stool,[ ]   Blood in stool, [ ]  Peptic ulcer disease, [ ]  Reflux, [ ]  Hiatal hernia, [ ]  Trouble swallowing, [ ]  Diarrhea, [ ]  Constipation  Urinary:  [ ]  Kidney disease, [ ]  Burning with urination, [ ]  Frequent urination, [ ]  Difficulty urinating  Skin: [ ]  Ulcers, [ ]  Rashes   Physical Examination  Filed Vitals:   03/26/11 1048  BP: 170/63  Pulse: 66  Resp: 16  Height: 5\' 5"  (1.651 m)  Weight: 212 lb (96.163 kg)  SpO2: 93%   Body mass index is 35.28 kg/(m^2).   General: A&O x 3, WDWN, obese  Head: [place]/AT  Ear/Nose/Throat: Hearing grossly intact, nares w/o erythema or drainage, oropharynx w/o Erythema/Exudate  Eyes: PERRLA, EOMI  Neck: Supple, no nuchal rigidity, no palpable LAD  Pulmonary: Sym exp, good air movt, CTAB, no rales, rhonchi, & wheezing  Cardiac: RRR, Nl S1, S2, no Murmurs, rubs or gallops  Vascular: Vessel Right Left  Radial Palpable Palpable  Brachial Palpable Palpable  Carotid Palpable, without bruit Palpable, without bruit  Aorta Non-palpable due to obesity N/A  Femoral Palpable Palpable  Popliteal Non-palpable Non-palpable  PT Faintly Palpable Faintly Palpable  DP Palpable Palpable   Gastrointestinal: soft, NTND, -G/R, - HSM, - masses, - CVAT B, pannus overlying groins  Musculoskeletal: M/S 5/5 throughout , Extremities without ischemic changes , bilateral cyanosis and extensive varicosities, B lipodermatosclerosis (L>R)  Neurologic: CN 2-12 intact , Pain and light touch intact in extremities , Motor exam as listed above  Psychiatric: Judgment intact, Mood & affect appropriatefor pt's clinical situation  Dermatologic: See M/S exam for extremity exam, no rashes otherwise noted  Lymph : No Cervical, Axillary, or Inguinal lymphadenopathy   Non-Invasive Vascular Imaging  ABI (Date: 03/26/11)  RLE: 0.73, PT & DP: biphasic  LLE: 0.61, PT & DP:  biphasic   Outside Studies/Documentation 3 pages of outside documents were reviewed including: outside clinic evaluation.  Medical Decision Making  Kristina Summers is a 75 y.o. female who presents with: lifestyle limiting BLE intermittent claudication.   This patient's ABI are likely artificially elevated given her DM which caused medial calcification  Furthermore, her ABI likely drop further with exertion, so her actual PAD is likely worse than the  ABI suggest.  I recommend proceeding with an Aortogram, Bilateral leg runoff, Possible intervention on right leg first which is scheduled for the 13 DEC 12.  I discussed with the patient the natural history of intermittent claudication: 75% of patients have stable or improved symptoms in a year an only 2% require amputation. Eventually 20% may require intervention in a year.  I discussed in depth with the patient the nature of atherosclerosis, and emphasized the importance of maximal medical management including strict control of blood pressure, blood glucose, and lipid levels, obtaining regular exercise, and cessation of smoking.  The patient is aware that without maximal medical management the underlying atherosclerotic disease process will progress, limiting the benefit of any interventions.  I discussed in depth with the patient a walking plan and how to execute such. I discussed with the patient the nature of angiographic procedures, especially the limited patencies of any endovascular intervention.  The patient is aware of that the risks of an angiographic procedure include but are not limited to: bleeding, infection, access site complications, renal failure, embolization, rupture of vessel, dissection, possible need for emergent surgical intervention, possible need for surgical procedures to treat the patient's pathology, and stroke and death.  The patient is aware of the risks and agrees to proceed.  Thank you for allowing [place] to participate in  this patient's care.  Adele Barthel, MD [facility] [place] Office: [phone] Pager: [phone]  03/26/2011, 11:29 AM

## 2011-03-30 ENCOUNTER — Encounter (HOSPITAL_COMMUNITY): Payer: Self-pay

## 2011-03-31 MED ORDER — SODIUM CHLORIDE 0.9 % IV SOLN
INTRAVENOUS | Status: DC
  Administered 2011-04-01: 19:00:00 via INTRAVENOUS

## 2011-04-01 ENCOUNTER — Encounter (HOSPITAL_COMMUNITY): Payer: Self-pay | Admitting: General Practice

## 2011-04-01 ENCOUNTER — Other Ambulatory Visit: Payer: Self-pay

## 2011-04-01 ENCOUNTER — Encounter (HOSPITAL_COMMUNITY)
Admission: RE | Disposition: A | Discharge status: Home / Self Care | Payer: Self-pay | Source: Ambulatory Visit | Attending: Vascular Surgery

## 2011-04-01 ENCOUNTER — Ambulatory Visit (HOSPITAL_COMMUNITY)
Admission: RE | Admit: 2011-04-01 | Discharge: 2011-04-02 | Disposition: A | Discharge status: Home / Self Care | Payer: Medicare Other | Source: Ambulatory Visit | Attending: Vascular Surgery | Admitting: Vascular Surgery

## 2011-04-01 DIAGNOSIS — I70219 Atherosclerosis of native arteries of extremities with intermittent claudication, unspecified extremity: Secondary | ICD-10-CM | POA: Insufficient documentation

## 2011-04-01 DIAGNOSIS — R079 Chest pain, unspecified: Secondary | ICD-10-CM | POA: Insufficient documentation

## 2011-04-01 DIAGNOSIS — E119 Type 2 diabetes mellitus without complications: Secondary | ICD-10-CM | POA: Insufficient documentation

## 2011-04-01 DIAGNOSIS — E669 Obesity, unspecified: Secondary | ICD-10-CM | POA: Insufficient documentation

## 2011-04-01 DIAGNOSIS — I252 Old myocardial infarction: Secondary | ICD-10-CM | POA: Insufficient documentation

## 2011-04-01 DIAGNOSIS — I4891 Unspecified atrial fibrillation: Secondary | ICD-10-CM | POA: Insufficient documentation

## 2011-04-01 DIAGNOSIS — I10 Essential (primary) hypertension: Secondary | ICD-10-CM | POA: Insufficient documentation

## 2011-04-01 DIAGNOSIS — I2510 Atherosclerotic heart disease of native coronary artery without angina pectoris: Secondary | ICD-10-CM | POA: Insufficient documentation

## 2011-04-01 HISTORY — PX: LOWER EXTREMITY ANGIOGRAM: SHX5508

## 2011-04-01 HISTORY — DX: Fibromyalgia: M79.7

## 2011-04-01 HISTORY — PX: PERCUTANEOUS STENT INTERVENTION: SHX5500

## 2011-04-01 HISTORY — PX: ABDOMINAL AORTAGRAM: SHX5454

## 2011-04-01 HISTORY — DX: Gastro-esophageal reflux disease without esophagitis: K21.9

## 2011-04-01 HISTORY — PX: PERIPHERAL ARTERIAL STENT GRAFT: SHX2220

## 2011-04-01 LAB — CBC
MCHC: 34.3 g/dL (ref 30.0–36.0)
RDW: 12.8 % (ref 11.5–15.5)
WBC: 13.2 10*3/uL — ABNORMAL HIGH (ref 4.0–10.5)

## 2011-04-01 LAB — GLUCOSE, CAPILLARY
Glucose-Capillary: 103 mg/dL — ABNORMAL HIGH (ref 70–99)
Glucose-Capillary: 199 mg/dL — ABNORMAL HIGH (ref 70–99)
Glucose-Capillary: 402 mg/dL — ABNORMAL HIGH (ref 70–99)

## 2011-04-01 LAB — POCT ACTIVATED CLOTTING TIME: Activated Clotting Time: 177 seconds

## 2011-04-01 SURGERY — ANGIOGRAM, LOWER EXTREMITY
Laterality: Right

## 2011-04-01 MED ORDER — INSULIN NPH (HUMAN) (ISOPHANE) 100 UNIT/ML ~~LOC~~ SUSP: 34.0000 [IU] | Freq: Two times a day (BID) | SUBCUTANEOUS | Status: DC

## 2011-04-01 MED ORDER — METOPROLOL TARTRATE 12.5 MG HALF TABLET
25.0000 mg | ORAL_TABLET | Freq: Two times a day (BID) | ORAL | Status: DC
  Administered 2011-04-01 – 2011-04-02 (×2): 25 mg via ORAL
  Filled 2011-04-01 (×2): qty 2

## 2011-04-01 MED ORDER — PANTOPRAZOLE SODIUM 40 MG PO TBEC
40.0000 mg | DELAYED_RELEASE_TABLET | Freq: Every day | ORAL | Status: DC
  Administered 2011-04-01: 40 mg via ORAL
  Filled 2011-04-01 (×2): qty 1

## 2011-04-01 MED ORDER — ONDANSETRON HCL 4 MG/2ML IJ SOLN: 4.0000 mg | Freq: Four times a day (QID) | INTRAMUSCULAR | Status: DC | PRN

## 2011-04-01 MED ORDER — ACETAMINOPHEN 325 MG PO TABS: 650.0000 mg | ORAL_TABLET | ORAL | Status: DC | PRN

## 2011-04-01 MED ORDER — CRANBERRY EXTRACT 250 MG PO TABS: 250.0000 mg | ORAL_TABLET | Freq: Every day | ORAL | Status: DC

## 2011-04-01 MED ORDER — FENTANYL CITRATE 0.05 MG/ML IJ SOLN
INTRAMUSCULAR | Status: AC
  Filled 2011-04-01: qty 2

## 2011-04-01 MED ORDER — INSULIN NPH (HUMAN) (ISOPHANE) 100 UNIT/ML ~~LOC~~ SUSP
34.0000 [IU] | Freq: Every day | SUBCUTANEOUS | Status: DC
  Administered 2011-04-01: 34 [IU] via SUBCUTANEOUS

## 2011-04-01 MED ORDER — INSULIN ASPART 100 UNIT/ML ~~LOC~~ SOLN
0.0000 [IU] | Freq: Three times a day (TID) | SUBCUTANEOUS | Status: DC
  Administered 2011-04-02: 5 [IU] via SUBCUTANEOUS
  Administered 2011-04-02: 11 [IU] via SUBCUTANEOUS
  Filled 2011-04-01: qty 3

## 2011-04-01 MED ORDER — OXYCODONE-ACETAMINOPHEN 5-325 MG PO TABS
1.0000 | ORAL_TABLET | ORAL | Status: DC | PRN
Start: 2011-04-01 — End: 2011-04-02

## 2011-04-01 MED ORDER — HEPARIN SODIUM (PORCINE) 1000 UNIT/ML IJ SOLN
INTRAMUSCULAR | Status: AC
  Filled 2011-04-01: qty 1

## 2011-04-01 MED ORDER — INSULIN NPH (HUMAN) (ISOPHANE) 100 UNIT/ML ~~LOC~~ SUSP
38.0000 [IU] | Freq: Every day | SUBCUTANEOUS | Status: DC
  Administered 2011-04-02: 38 [IU] via SUBCUTANEOUS
  Filled 2011-04-01: qty 10

## 2011-04-01 MED ORDER — LOSARTAN POTASSIUM 50 MG PO TABS
50.0000 mg | ORAL_TABLET | Freq: Two times a day (BID) | ORAL | Status: DC
  Administered 2011-04-01 – 2011-04-02 (×2): 50 mg via ORAL
  Filled 2011-04-01 (×3): qty 1

## 2011-04-01 MED ORDER — INSULIN ASPART 100 UNIT/ML ~~LOC~~ SOLN
0.0000 [IU] | Freq: Every day | SUBCUTANEOUS | Status: DC
  Administered 2011-04-01: 5 [IU] via SUBCUTANEOUS

## 2011-04-01 MED ORDER — CLOPIDOGREL BISULFATE 300 MG PO TABS
300.0000 mg | ORAL_TABLET | Freq: Once | ORAL | Status: AC
  Administered 2011-04-01: 300 mg via ORAL
  Filled 2011-04-01: qty 1

## 2011-04-01 MED ORDER — OXYCODONE-ACETAMINOPHEN 5-325 MG PO TABS: 1.0000 | ORAL_TABLET | ORAL | Status: DC | PRN

## 2011-04-01 MED ORDER — HEPARIN (PORCINE) IN NACL 2-0.9 UNIT/ML-% IJ SOLN
INTRAMUSCULAR | Status: AC
  Filled 2011-04-01: qty 1000

## 2011-04-01 MED ORDER — LIDOCAINE HCL (PF) 1 % IJ SOLN
INTRAMUSCULAR | Status: AC
  Filled 2011-04-01: qty 30

## 2011-04-01 MED ORDER — B COMPLEX-C PO TABS
1.0000 | ORAL_TABLET | Freq: Every day | ORAL | Status: DC
  Administered 2011-04-01 – 2011-04-02 (×2): 1 via ORAL
  Filled 2011-04-01 (×2): qty 1

## 2011-04-01 MED ORDER — SIMVASTATIN 40 MG PO TABS
40.0000 mg | ORAL_TABLET | Freq: Every day | ORAL | Status: DC
  Administered 2011-04-01: 40 mg via ORAL
  Filled 2011-04-01 (×2): qty 1

## 2011-04-01 MED ORDER — MORPHINE SULFATE 2 MG/ML IJ SOLN: 2.0000 mg | INTRAMUSCULAR | Status: DC | PRN

## 2011-04-01 MED ORDER — MIDAZOLAM HCL 2 MG/2ML IJ SOLN
INTRAMUSCULAR | Status: AC
  Filled 2011-04-01: qty 2

## 2011-04-01 MED ORDER — ASPIRIN EC 81 MG PO TBEC
81.0000 mg | DELAYED_RELEASE_TABLET | Freq: Every day | ORAL | Status: DC
  Administered 2011-04-02: 81 mg via ORAL
  Filled 2011-04-01 (×2): qty 1

## 2011-04-01 MED ORDER — SODIUM CHLORIDE 0.9 % IV SOLN: 1.0000 mL/kg/h | INTRAVENOUS | Status: AC

## 2011-04-01 MED ORDER — SODIUM CHLORIDE 0.9 % IV SOLN
1.0000 mL/kg/h | INTRAVENOUS | Status: DC
  Administered 2011-04-01 – 2011-04-02 (×2): 1 mL/kg/h via INTRAVENOUS

## 2011-04-01 MED ORDER — CLOPIDOGREL BISULFATE 75 MG PO TABS: 75.0000 mg | ORAL_TABLET | Freq: Every day | ORAL | Status: AC

## 2011-04-01 NOTE — H&P (View-Only) (Signed)
[facility] [place]  Referred by:  Dorna Mai, MD [phone], [place] [address]  Reason for referral: B short distance claudication  History of Present Illness  Kristina Summers is a 75 y.o. female who presents with chief complaint: B calf pain with ambulation.  Onset of symptom occurred roughly one year ago.  Pain is described as aching in calves (R>L), severity 3-6/10, and associated with walking.  Patient notes [place], which interferes with her ability to complete ADLs.  Patient has attempted to treat this pain with rest and OTC rx.  The patient has no rest pain symptoms also and no leg wounds/ulcers.  Atherosclerotic risk factors include: bese, HTN, DM, and hypercholesterol.  Past Medical History  Diagnosis Date  . CAD (coronary artery disease)   . Obesity   . Chest pain   . Atrial fibrillation   . Hypertension   . Myocardial infarction   . Diabetes mellitus   . Hyperlipidemia   . Peripheral vascular disease   . Dizziness   . Carpal tunnel syndrome   . GI bleed 1/11  . Carotid artery occlusion    Past Surgical History  Procedure Date  . Coronary artery bypass graft   . Trigger finger release 1996    Bilateral Thumb releases    History   Social History  . Marital Status: Divorced    Spouse Name: N/A    Number of Children: N/A  . Years of Education: N/A   Occupational History  . Not on file.   Social History Main Topics  . Smoking status: Former Research scientist (life sciences)  . Smokeless tobacco: Not on file  . Alcohol Use: No  . Drug Use: No  . Sexually Active:    Other Topics Concern  . Not on file   Social History Narrative  . No narrative on file    History reviewed. No pertinent family history.  Current Outpatient Prescriptions on File Prior to Visit  Medication Sig Dispense Refill  . ascorbic acid (VITAMIN C) 500 MG tablet Take 500 mg by mouth daily.        Marland Kitchen aspirin EC 81 MG tablet Take 81 mg by  mouth daily.        Marland Kitchen atorvastatin (LIPITOR) 80 MG tablet Take 80 mg by mouth daily.        . Biotin 300 MCG TABS Take 1,000 mcg by mouth daily.        . calcium carbonate (OS-CAL) 1250 MG chewable tablet Chew 1 tablet by mouth daily.        . Cranberry Extract 250 MG TABS Take 250 mg by mouth daily.        . furosemide (LASIX) 40 MG tablet Take 40 mg by mouth 2 (two) times daily.        . insulin NPH-insulin regular (NOVOLIN 70/30) (70-30) 100 UNIT/ML injection Inject 37 Units into the skin daily with breakfast. 33 units in the evening       . losartan (COZAAR) 50 MG tablet Take 50 mg by mouth 2 (two) times daily.        . metoprolol succinate (TOPROL-XL) 25 MG 24 hr tablet Take 25 mg by mouth 2 (two) times daily.        Marland Kitchen omeprazole (PRILOSEC) 40 MG capsule Take 40 mg by mouth daily.          Allergies  Allergen Reactions  . Lisinopril Cough  . Sertraline Hcl  REACTION: nausea     Review of Systems (Positive items checked otherwise negative)  General: [ ]  Weight loss, [x]  Weight gain, [ ]   Loss of appetite, [ ]  Fever  Neurologic: [ ]  Dizziness, [ ]  Blackouts, [ ]  Headaches, [ ]  Seizure  Ear/Nose/Throat: [ ]  Change in eyesight, [ ]  Change in hearing, [ ]  Nose bleeds, [ ]  Sore throat  Vascular: [x]  Pain in legs with walking, [ ]  Pain in feet while lying flat, [ ]  Non-healing ulcer, Stroke, [ ]  "Mini stroke", [ ]  Slurred speech, [ ]  Temporary blindness, [ ]  Blood clot in vein, [x]  Phlebitis  Pulmonary: [ ]  Home oxygen, [ ]  Productive cough, [ ]  Bronchitis, [ ]  Coughing up blood,  [ ]  Asthma, [ ]  Wheezing  Musculoskeletal: [x]  Arthritis, [x]  Joint pain, [ ]  Muscle pain  Cardiac: [ ]  Chest pain, [ ]  Chest tightness/pressure, [ ]  Shortness of breath when lying flat, [ ]  Shortness of breath with exertion, [ ]  Palpitations, [ ]  Heart murmur, [x]  Arrythmia,  [x]  Atrial fibrillation  Hematologic: [ ]  Bleeding problems, [ ]  Clotting disorder, [ ]  Anemia  Psychiatric:  [ ]   Depression, [ ]  Anxiety, [ ]  Attention deficit disorder  Gastrointestinal:  [ ]  Black stool,[ ]   Blood in stool, [ ]  Peptic ulcer disease, [ ]  Reflux, [ ]  Hiatal hernia, [ ]  Trouble swallowing, [ ]  Diarrhea, [ ]  Constipation  Urinary:  [ ]  Kidney disease, [ ]  Burning with urination, [ ]  Frequent urination, [ ]  Difficulty urinating  Skin: [ ]  Ulcers, [ ]  Rashes   Physical Examination  Filed Vitals:   03/26/11 1048  BP: 170/63  Pulse: 66  Resp: 16  Height: 5\' 5"  (1.651 m)  Weight: 212 lb (96.163 kg)  SpO2: 93%   Body mass index is 35.28 kg/(m^2).   General: A&O x 3, WDWN, obese  Head: [place]/AT  Ear/Nose/Throat: Hearing grossly intact, nares w/o erythema or drainage, oropharynx w/o Erythema/Exudate  Eyes: PERRLA, EOMI  Neck: Supple, no nuchal rigidity, no palpable LAD  Pulmonary: Sym exp, good air movt, CTAB, no rales, rhonchi, & wheezing  Cardiac: RRR, Nl S1, S2, no Murmurs, rubs or gallops  Vascular: Vessel Right Left  Radial Palpable Palpable  Brachial Palpable Palpable  Carotid Palpable, without bruit Palpable, without bruit  Aorta Non-palpable due to obesity N/A  Femoral Palpable Palpable  Popliteal Non-palpable Non-palpable  PT Faintly Palpable Faintly Palpable  DP Palpable Palpable   Gastrointestinal: soft, NTND, -G/R, - HSM, - masses, - CVAT B, pannus overlying groins  Musculoskeletal: M/S 5/5 throughout , Extremities without ischemic changes , bilateral cyanosis and extensive varicosities, B lipodermatosclerosis (L>R)  Neurologic: CN 2-12 intact , Pain and light touch intact in extremities , Motor exam as listed above  Psychiatric: Judgment intact, Mood & affect appropriatefor pt's clinical situation  Dermatologic: See M/S exam for extremity exam, no rashes otherwise noted  Lymph : No Cervical, Axillary, or Inguinal lymphadenopathy   Non-Invasive Vascular Imaging  ABI (Date: 03/26/11)  RLE: 0.73, PT & DP: biphasic  LLE: 0.61, PT & DP:  biphasic   Outside Studies/Documentation 3 pages of outside documents were reviewed including: outside clinic evaluation.  Medical Decision Making  Kristina Summers is a 75 y.o. female who presents with: lifestyle limiting BLE intermittent claudication.   This patient's ABI are likely artificially elevated given her DM which caused medial calcification  Furthermore, her ABI likely drop further with exertion, so her actual PAD is likely worse than the  ABI suggest.  I recommend proceeding with an Aortogram, Bilateral leg runoff, Possible intervention on right leg first which is scheduled for the 13 DEC 12.  I discussed with the patient the natural history of intermittent claudication: 75% of patients have stable or improved symptoms in a year an only 2% require amputation. Eventually 20% may require intervention in a year.  I discussed in depth with the patient the nature of atherosclerosis, and emphasized the importance of maximal medical management including strict control of blood pressure, blood glucose, and lipid levels, obtaining regular exercise, and cessation of smoking.  The patient is aware that without maximal medical management the underlying atherosclerotic disease process will progress, limiting the benefit of any interventions.  I discussed in depth with the patient a walking plan and how to execute such. I discussed with the patient the nature of angiographic procedures, especially the limited patencies of any endovascular intervention.  The patient is aware of that the risks of an angiographic procedure include but are not limited to: bleeding, infection, access site complications, renal failure, embolization, rupture of vessel, dissection, possible need for emergent surgical intervention, possible need for surgical procedures to treat the patient's pathology, and stroke and death.  The patient is aware of the risks and agrees to proceed.  Thank you for allowing [place] to participate in  this patient's care.  Adele Barthel, MD [facility] Office: [phone] Pager: [phone]  03/26/2011, 11:29 AM

## 2011-04-01 NOTE — Plan of Care (Signed)
Problem: Phase I Progression Outcomes Goal: Vascular site scale level 0 - I Vascular Site Scale Level 0: No bruising/bleeding/hematoma Level I (Mild): Bruising/Ecchymosis, minimal bleeding/ooozing, palpable hematoma < 3 cm Level II (Moderate): Bleeding not affecting hemodynamic parameters, pseudoaneurysm, palpable hematoma > 3 cm Outcome: Progressing Level 3

## 2011-04-01 NOTE — Op Note (Signed)
OPERATIVE NOTE   PROCEDURE: 1.  left common femoral artery cannulation under ultrasound guidance 2.  Aortogram 3.  Third order arterial selection 4.  Right leg runoff 5.  Stenting of right common iliac artery 6.  Left leg runoff  PRE-OPERATIVE DIAGNOSIS: Lifestyle limiting claudication  POST-OPERATIVE DIAGNOSIS: same as above   SURGEON: Adele Barthel, MD  ANESTHESIA: conscious sedation  ESTIMATED BLOOD LOSS: 30 cc  CONTRAST: 150 cc  FINDING(S):  Aorta: patent  Superior mesenteric artery: patent Celiac artery: patent  Right Left  RA Patent, <30% orificial stenosis Patent, <30% orificial stenosis  CIA Patent, distal 1/3 stenosis (30 mm Hg gradient) -> resolved with stenting Patent  EIA Patent Patent  IIA Patent Patent  CFA Patent Patent  SFA Multiple calcific stenoses < 30% Multiple calcific stenoses, mid-segment occlusion  PFA Patent Patent  Pop Patent Patent  Trif Patent Patent  AT Patent, diminishes distally Patent, diminishes distally but continues as runoff onto foot  Pero Patent, diminishes distally Patent, diminishes distally  PT Patent, dominant runoff Patent, dominant runoff   SPECIMEN(S):  none  INDICATIONS:   Kristina Summers is a 75 y.o. female who  presents with bilateral short distance claudication.  Based on her history, exam and non-invasive studies, she had evidence of peripheral arterial disease.  Given the limitations she had with her activities of daily living, she elected to proceed with an angiographic procedure.  I discussed with the patient the nature of angiographic procedures, especially the limited patencies of any endovascular intervention.  The patient is aware of that the risks of an angiographic procedure include but are not limited to: bleeding, infection, access site complications, renal failure, embolization, rupture of vessel, dissection, possible need for emergent surgical intervention, possible need for surgical procedures to treat the patient's  pathology, and stroke and death.  The patient is aware of the risks and agrees to proceed.  DESCRIPTION: After full informed consent was obtained from the patient, the patient was brought back to the angiography suite.  The patient was placed supine upon the angiography table and connected to monitoring equipment.  The patient was then given conscious sedation, the amounts of which are documented in the patient's chart.  The patient was prepped and drape in the standard fashion for an angiographic procedure.  At this point, attention was turned to the left groin.  Under ultrasound guidance, the left common femoral artery was cannulated with a micropuncture needle.  The microwire was advanced into the iliac arterial system.  The needle was exchanged for a microsheath, which was loaded into the common femoral artery over the wire.  The microwire was exchanged for a [facility] wire which was advanced into the aorta.  The microsheath was then exchanged for a 5-Fr sheath which was loaded into the common femoral artery.  The Omniflush catheter was then loaded over the wire up to the level of L1.  The catheter was connected to the power injector circuit.  After de-airring and de-clotting the circuit, a power injector aortogram was completed.  The [facility] wire was replaced in the catheter, and using the [place] and Omniflush, the right common iliac artery was selected.  The wire were advanced into the external iliac artery, but the catheter would not advance pass the proximal common iliac artery so I exchanged the catheter for an end-hole catheter, which I used to get down to the external iliac artery.  The wire was removed and the catheter was connected to the power injector.  An automated  right leg runoff was completed.  I then hooked the endhole catheter to the arterial pressure transducer and performed a pull back gradient across the distal iliac lesion.  A gradient >30 mm Hg was completed.  The [facility] wire was replaced in  the catheter, and using the [place] and Omniflush catheter, the right common iliac artery was selected.  I loaded an endhole catheter over the wire, but it would not cross the iliac stenosis.  The catheter was exchanged for a Navicross catheter and the wire exchanged for a Glidewire.  Using these, I was able to get into the right superficial femoral artery.  The wire was exchanged for an Amplatz wire.  The catheter was removed.  The left femoral sheath was exchanged over the wire for a 7-Fr Ansel sheath which I lodged into the proximal right common iliac artery.  The patient was given 8000 units of Heparin for anticoagulation.  I did a hand injection which demonstrated the right common iliac artery stenosis.  Based on the measurements of the lesion, I selected a Genesis 8 mm x 24 mm balloon expandable stent.  This was delivered to the stenosis and centered on the lesion.  Hand injection confirmed the position of the lesion.  The stent was deployed without any difficulties.  Completion hand injection demonstrated complete resolution of the lesion.  I then replaced the dilator in the sheath, recaptured the Amplatz wire, and pulled the sheath with wire into the left external iliac artery.  The wire and dilator were removed.  The left sheath was aspirated and then connected to power injector circuit.  An automated left leg runoff was then completed.  The plan is to remove the sheath in the holding area.  Based on the images, she will need a percutaneous intervention on the left superficial femoral artery in the future.  As the left femoral sheath is in the way of an intervention, it cannot be completed today.    COMPLICATIONS: none  CONDITION: stable   Adele Barthel, MD [facility] [place] Office: [phone] Pager: [phone]  04/01/2011, 12:50 PM

## 2011-04-01 NOTE — Progress Notes (Signed)
VASCULAR PROGRESS NOTE  SUBJECTIVE: Called to see patient because of hematoma and left thigh. The patient denies any pain in the left thigh. He denies nausea, dizziness, or diaphoresis.  PHYSICAL EXAM: Filed Vitals:   04/01/11 1032 04/01/11 1116  BP: 137/59   Pulse: 72 64  Temp: 97 F (36.1 C)   TempSrc: Oral   Resp: 18   Height: 5' 5.5" (1.664 m)   Weight: 212 lb (96.163 kg)   SpO2: 92%    Lungs: clear to auscultation There is moderate swelling in the medial left thigh below the site of cannulation. The swelling is soft.    Basename 04/01/11 1305  GLUCAP 103*     ASSESSMENT/PLAN: 1. This patient underwent ultrasound-guided access to the left common femoral artery with aortogram and stenting of the right common iliac artery. The sheath was removed at approximately 1:30 PM. Pressure was held until approximately 2 PM. Approximately one hour later it was noted that the patient had developed some moderate swelling in the medial left thigh below the site of cannulation. This is fairly soft and currently there is no evidence of active bleeding. She is not tachycardic and her blood pressure is stable. 2. I think it would be safest to admit her overnight for observation. We will admit her to 2000. We'll check a CBC later tonight. If she has any problems with hypotension we will obtain a CT scan of the pelvis.  Deitra Mayo, MD, FACS Beeper: [phone] 04/01/2011

## 2011-04-01 NOTE — Interval H&P Note (Signed)
--    [facility] [place]  History and Physical Update  The patient was interviewed and re-examined.  The patient's History and Physical has been reviewed and is unchanged.  There is no change in the plan of care.  Adele Barthel, MD [facility] [place] Office: [phone] Pager: [phone]  04/01/2011, 10:02 AM

## 2011-04-01 NOTE — Progress Notes (Signed)
PHARMACIST - PHYSICIAN ORDER COMMUNICATION  CONCERNING: P&T Medication Policy on Herbal Medications  DESCRIPTION:  This patient's order for:  Cranberry extract  has been noted.  This product(s) is classified as an "herbal" or natural product. Due to a lack of definitive safety studies or FDA approval, nonstandard manufacturing practices, plus the potential risk of unknown drug-drug interactions while on inpatient medications, the Pharmacy and Therapeutics Committee does not permit the use of "herbal" or natural products of this type within [facility].   ACTION TAKEN: The pharmacy department is unable to verify this order at this time and your patient has been informed of this safety policy. Please reevaluate patient's clinical condition at discharge and address if the herbal or natural product(s) should be resumed at that time.  Thanks, Kazue Cerro K. Posey Pronto, PharmD, BCPS.  Clinical Pharmacist Pager [phone]. 04/01/2011 5:00 PM

## 2011-04-02 LAB — GLUCOSE, CAPILLARY
Glucose-Capillary: 321 mg/dL — ABNORMAL HIGH (ref 70–99)
Glucose-Capillary: 224 mg/dL — ABNORMAL HIGH (ref 70–99)

## 2011-04-02 LAB — POCT I-STAT, CHEM 8
Chloride: 106 mEq/L (ref 96–112)
Glucose, Bld: 156 mg/dL — ABNORMAL HIGH (ref 70–99)
HCT: 41 % (ref 36.0–46.0)
Hemoglobin: 13.9 g/dL (ref 12.0–15.0)
Potassium: 3.9 mEq/L (ref 3.5–5.1)
Sodium: 141 mEq/L (ref 135–145)

## 2011-04-02 LAB — CBC
HCT: 31.9 % — ABNORMAL LOW (ref 36.0–46.0)
MCH: 31.9 pg (ref 26.0–34.0)
MCHC: 33.7 g/dL (ref 30.0–36.0)
Platelets: 202 10*3/uL (ref 150–400)
RDW: 12.9 % (ref 11.5–15.5)
RDW: 13 % (ref 11.5–15.5)
WBC: 9.7 10*3/uL (ref 4.0–10.5)

## 2011-04-02 NOTE — Progress Notes (Signed)
Inpatient Diabetes Program Recommendations  AACE/ADA: New Consensus Statement on Inpatient Glycemic Control (2009)  Target Ranges:  Prepandial:   less than 140 mg/dL      Peak postprandial:   less than 180 mg/dL (1-2 hours)      Critically ill patients:  140 - 180 mg/dL   Reason for Visit: CBG's > than 300 mg/dL.  Home dose of NPH restarted last PM.  Inpatient Diabetes Program Recommendations HgbA1C: Please order to determine pre-hospitalization glycemic control.  Note: will follow.

## 2011-04-02 NOTE — Discharge Summary (Signed)
Physician Discharge Summary  Patient ID: Kristina Summers MRN: [id number] DOB/AGE: 06-13-1935 75 y.o.  Admit date: 04/01/2011 Discharge date: 04/02/2011  Admission Diagnosis: 1.  Lifestyling limiting claudication 2.  Left thigh hematoma  Discharge Diagnoses:  1.  Lifestyling limiting claudication 2.  Left thigh hematoma  Procedures: 1. left common femoral artery cannulation under ultrasound guidance  2. Aortogram  3. Third order arterial selection  4. Right leg runoff  5. Stenting of right common iliac artery  6. Left leg runoff  Discharged Condition: stable  Hospital Course:   Kristina Summers is 75 y.o. female with lifestyle limiting claudication who underwent R CIA stenting.  This was complicated with a left thigh hematoma.  She was observed overnight and has been hemodynamically stable with stable blood level.  The patient is stable for discharge after <24 hour observation  Significant Diagnostic Studies: CBC    Component Value Date/Time   WBC 9.7 04/02/2011 1151   RBC 3.32* 04/02/2011 1151   HGB 10.8* 04/02/2011 1151   HCT 31.9* 04/02/2011 1151   PLT 199 04/02/2011 1151   MCV 96.1 04/02/2011 1151   MCH 32.5 04/02/2011 1151   MCHC 33.9 04/02/2011 1151   RDW 13.0 04/02/2011 1151   LYMPHSABS 1.4 05/20/2009 2055   MONOABS 0.6 05/20/2009 2055   EOSABS 0.4 05/20/2009 2055   BASOSABS 0.1 05/20/2009 2055    BMET    Component Value Date/Time   NA 141 04/01/2011 1052   K 3.9 04/01/2011 1052   CL 106 04/01/2011 1052   CO2 25 05/20/2009 2055   GLUCOSE 156* 04/01/2011 1052   BUN 17 04/01/2011 1052   CREATININE 0.80 04/01/2011 1052   CALCIUM 9.4 05/20/2009 2055   GFRNONAA >60 04/25/2009 0430   GFRAA  Value: >60        The eGFR has been calculated using the MDRD equation. This calculation has not been validated in all clinical situations. eGFR's persistently <60 mL/min signify possible Chronic Kidney Disease. 04/25/2009 0430    COAG Lab Results  Component Value Date   INR 1.23 04/26/2009     INR 1.40 04/25/2009   INR 1.48 04/23/2009   No results found for this basename: PTT    Disposition: Home or Self Care  Discharge Orders    Future Appointments: Provider: Department: Dept Phone: Center:   05/07/2011 1:45 PM Hinda Lenis, MD [facility] [phone] [facility]     Future Orders Please Complete By Expires   Resume previous diet      Driving Restrictions      Comments:   No driving for 2 weeks   Lifting restrictions      Comments:   No lifting for 2 weeks   Call MD for:  temperature >100.5      Call MD for:  redness, tenderness, or signs of infection (pain, swelling, bleeding, redness, odor or green/yellow discharge around incision site)      Call MD for:  severe or increased pain, loss or decreased feeling  in affected limb(s)      Increase activity slowly      Comments:   Walk with assistance use walker or cane as needed   No wound care        Current Discharge Medication List    START taking these medications   Details  clopidogrel (PLAVIX) 75 MG tablet Take 1 tablet (75 mg total) by mouth daily. Qty: 30 tablet, Refills: 11      CONTINUE these medications which have NOT CHANGED  Details  aspirin EC 81 MG tablet Take 81 mg by mouth daily.      atorvastatin (LIPITOR) 80 MG tablet Take 80 mg by mouth daily.      B Complex-C (B-COMPLEX WITH VITAMIN C) tablet Take 1 tablet by mouth daily.      BIOTIN PO Take 1,000 mcg by mouth daily.      Calcium Carbonate-Vitamin D (CALCIUM 600 + D PO) Take 1 tablet by mouth daily.      Cranberry Extract 250 MG TABS Take 250 mg by mouth daily.      furosemide (LASIX) 40 MG tablet Take 20-60 mg by mouth 2 (two) times daily. Pt takes 1.5 tabs (60mg ) in the morning and 1/2 tablet (20mg ) in the evening    insulin NPH (HUMULIN N,NOVOLIN N) 100 UNIT/ML injection Inject 34-38 Units into the skin 2 (two) times daily. 38 units in the morning and 34 units in the evening.     losartan (COZAAR) 50 MG tablet Take 50 mg by mouth  2 (two) times daily.      metoprolol tartrate (LOPRESSOR) 25 MG tablet Take 25 mg by mouth 2 (two) times daily.      omeprazole (PRILOSEC) 40 MG capsule Take 40 mg by mouth daily.         Follow-up Information    Follow up with Hinda Lenis, MD in 4 weeks.   Contact information:   [address] [place] [phone]          Signed: Hinda Lenis 04/02/2011, 2:21 PM

## 2011-04-02 NOTE — Progress Notes (Signed)
Patient given discharge instructions and medication list/prescriptions. Pt verbalized understanding.  Explained groin precautions and when to call md if her groin should begin to look/feel any different or bigger.  Vergie Living

## 2011-04-02 NOTE — Progress Notes (Signed)
Pt. Blood pressure showed 97/72 on automatic vital sign machine, B/P taken manually and was 110/60. Hematoma had no changes from earlier in size. MD notified, gave order to just continue to monitor patient. Will continue to monitor.

## 2011-04-02 NOTE — Progress Notes (Addendum)
[facility] [place]  Daily Progress Note  Assessment/Planning: POD #1 s/p L CFA cann., R CIA stenting   Admitted overnight for observation for L leg hematoma: pt is asx currently  Will check noon H/H to verify stabilization then D/C  Subjective  - 1 Day Post-Op  No complaints  Objective Filed Vitals:   04/01/11 1032 04/01/11 1116 04/01/11 2152 04/02/11 0700  BP: 137/59  124/55 127/62  Pulse: 72 64 84 72  Temp: 97 F (36.1 C)  97.6 F (36.4 C) 98.2 F (36.8 C)  TempSrc: Oral  Oral Oral  Resp: 18  18 18   Height: 5' 5.5" (1.664 m)     Weight: 212 lb (96.163 kg)     SpO2: 92%  95% 91%    Intake/Output Summary (Last 24 hours) at 04/02/11 0756 Last data filed at 04/01/11 2157  Gross per 24 hour  Intake 747.26 ml  Output    800 ml  Net -52.74 ml    PULM  CTAB CV  RRR GI  soft, NTND VASC  L groin: no pulsatile mass, L thigh hematoma  Laboratory CBC    Component Value Date/Time   WBC 8.7 04/02/2011 0600   HGB 10.6* 04/02/2011 0600   HCT 31.5* 04/02/2011 0600   PLT 202 04/02/2011 0600    BMET    Component Value Date/Time   NA 136 05/20/2009 2055   K 4.5 05/20/2009 2055   CL 100 05/20/2009 2055   CO2 25 05/20/2009 2055   GLUCOSE 223* 05/20/2009 2055   BUN 16 05/20/2009 2055   CREATININE 0.77 05/20/2009 2055   CALCIUM 9.4 05/20/2009 2055   GFRNONAA >60 04/25/2009 0430   GFRAA  Value: >60        The eGFR has been calculated using the MDRD equation. This calculation has not been validated in all clinical situations. eGFR's persistently <60 mL/min signify possible Chronic Kidney Disease. 04/25/2009 0430    Adele Barthel, MD [facility] [place] Office: [phone] Pager: [phone]  04/02/2011, 7:56 AM   Addendum,  CBC    Component Value Date/Time   WBC 9.7 04/02/2011 1151   RBC 3.32* 04/02/2011 1151   HGB 10.8* 04/02/2011 1151   HCT 31.9* 04/02/2011 1151   PLT 199 04/02/2011 1151   MCV 96.1 04/02/2011 1151   MCH  32.5 04/02/2011 1151   MCHC 33.9 04/02/2011 1151   RDW 13.0 04/02/2011 1151   LYMPHSABS 1.4 05/20/2009 2055   MONOABS 0.6 05/20/2009 2055   EOSABS 0.4 05/20/2009 2055   BASOSABS 0.1 05/20/2009 2055   Stable H/H. D/C home  Adele Barthel, MD [facility] [place] Office: [phone] Pager: [phone]  04/02/2011, 2:18 PM

## 2011-04-17 ENCOUNTER — Ambulatory Visit (INDEPENDENT_AMBULATORY_CARE_PROVIDER_SITE_OTHER): Payer: Medicare Other

## 2011-04-17 ENCOUNTER — Ambulatory Visit (HOSPITAL_COMMUNITY): Admission: RE | Admit: 2011-04-17 | Payer: Medicare Other | Source: Ambulatory Visit

## 2011-04-17 ENCOUNTER — Ambulatory Visit (HOSPITAL_COMMUNITY)
Admission: RE | Admit: 2011-04-17 | Discharge: 2011-04-17 | Disposition: A | Discharge status: Home / Self Care | Payer: Medicare Other | Source: Ambulatory Visit | Attending: Family Medicine | Admitting: Family Medicine

## 2011-04-17 DIAGNOSIS — I7090 Unspecified atherosclerosis: Secondary | ICD-10-CM

## 2011-04-17 DIAGNOSIS — M79609 Pain in unspecified limb: Secondary | ICD-10-CM

## 2011-04-17 DIAGNOSIS — R52 Pain, unspecified: Secondary | ICD-10-CM

## 2011-04-17 DIAGNOSIS — R29898 Other symptoms and signs involving the musculoskeletal system: Secondary | ICD-10-CM

## 2011-04-17 DIAGNOSIS — M7989 Other specified soft tissue disorders: Secondary | ICD-10-CM

## 2011-04-24 ENCOUNTER — Ambulatory Visit (INDEPENDENT_AMBULATORY_CARE_PROVIDER_SITE_OTHER): Payer: Medicare Other

## 2011-04-24 DIAGNOSIS — I739 Peripheral vascular disease, unspecified: Secondary | ICD-10-CM

## 2011-04-24 DIAGNOSIS — M79609 Pain in unspecified limb: Secondary | ICD-10-CM

## 2011-04-26 ENCOUNTER — Encounter: Payer: Self-pay | Admitting: Surgery

## 2011-04-26 ENCOUNTER — Ambulatory Visit (INDEPENDENT_AMBULATORY_CARE_PROVIDER_SITE_OTHER): Payer: Medicare Other | Admitting: Surgery

## 2011-04-26 VITALS — BP 163/91 | HR 72 | Resp 16 | Ht 65.0 in | Wt 212.0 lb

## 2011-04-26 DIAGNOSIS — M7989 Other specified soft tissue disorders: Secondary | ICD-10-CM | POA: Insufficient documentation

## 2011-04-26 DIAGNOSIS — I70219 Atherosclerosis of native arteries of extremities with intermittent claudication, unspecified extremity: Secondary | ICD-10-CM

## 2011-04-26 MED ORDER — LEVOFLOXACIN 500 MG PO TABS: 500.0000 mg | ORAL_TABLET | Freq: Every day | ORAL | Status: AC

## 2011-04-26 NOTE — Progress Notes (Signed)
[facility] [place]   Patient name: Kristina Summers MRN: [id number] DOB: Oct 31, 1935 Sex: female     Chief Complaint  Patient presents with  . Edema    Left leg, painful 2-3 weeks  . PVD    right leg, stent  December 2012    HISTORY OF PRESENT ILLNESS: The patient was seen as an add-on today for left leg swelling and redness. She was recently seen in the emergency room and diagnosed with cellulitis and started on cephalexin. She had a venous duplex which was negative for DVT. She spoke with the on-call doctor this weekend complaining of persistent pain and swelling and was told to come to the office today. The patient is recently status post stenting of her right common iliac artery by Dr. Bridgett Larsson. Findings at that time also revealed left superficial femoral occlusion.  The patient has been unable to keep her leg elevated to 2 pain when she gets above the level of her heart  Past Medical History  Diagnosis Date  . CAD (coronary artery disease)   . Obesity   . Atrial fibrillation   . Hypertension   . Diabetes mellitus   . Hyperlipidemia   . Peripheral vascular disease   . Dizziness   . Carpal tunnel syndrome   . GI bleed 1/11  . Carotid artery occlusion   . Normal nuclear stress test 03/18/2011  . Angina   . GERD (gastroesophageal reflux disease)   . Arthritis   . Fibromyalgia   . Myocardial infarction ~ 1991    Past Surgical History  Procedure Date  . Trigger finger release 1996    right thumb  . Cataract extraction w/ intraocular lens  implant, bilateral 2004-2005  . Coronary artery bypass graft 2000    CABG X5  . Peripheral arterial stent graft 04/01/11    right common iliac    History   Social History  . Marital Status: Divorced    Spouse Name: N/A    Number of Children: N/A  . Years of Education: N/A   Occupational History  . Not on file.   Social History Main Topics  . Smoking status: Former Smoker -- 2.0 packs/day for 30 years   Types: Cigarettes  . Smokeless tobacco: Never Used   Comment: stopped smoking cigarettes 1991  . Alcohol Use: No  . Drug Use: No  . Sexually Active: No   Other Topics Concern  . Not on file   Social History Narrative  . No narrative on file    History reviewed. No pertinent family history.  Allergies as of 04/26/2011 - Review Complete 04/26/2011  Allergen Reaction Noted  . Lisinopril Cough 03/04/2011  . Sertraline hcl  09/23/2008    Current Outpatient Prescriptions on File Prior to Visit  Medication Sig Dispense Refill  . aspirin EC 81 MG tablet Take 81 mg by mouth daily.        Marland Kitchen atorvastatin (LIPITOR) 80 MG tablet Take 80 mg by mouth daily.        . B Complex-C (B-COMPLEX WITH VITAMIN C) tablet Take 1 tablet by mouth daily.        Marland Kitchen BIOTIN PO Take 1,000 mcg by mouth daily.        . Calcium Carbonate-Vitamin D (CALCIUM 600 + D PO) Take 1 tablet by mouth daily.        . clopidogrel (PLAVIX) 75 MG tablet Take 1 tablet (75 mg total) by mouth daily.  30 tablet  11  .  Cranberry Extract 250 MG TABS Take 250 mg by mouth daily.        . furosemide (LASIX) 40 MG tablet Take 20-60 mg by mouth 2 (two) times daily. Pt takes 1.5 tabs (60mg ) in the morning and 1/2 tablet (20mg ) in the evening      . insulin NPH (HUMULIN N,NOVOLIN N) 100 UNIT/ML injection Inject 34-38 Units into the skin 2 (two) times daily. 38 units in the morning and 34 units in the evening.       Marland Kitchen losartan (COZAAR) 50 MG tablet Take 50 mg by mouth 2 (two) times daily.        . metoprolol tartrate (LOPRESSOR) 25 MG tablet Take 25 mg by mouth 2 (two) times daily.        Marland Kitchen omeprazole (PRILOSEC) 40 MG capsule Take 40 mg by mouth daily.           REVIEW OF SYSTEMS: Swelling and pain, left leg no chest pain no shortness of breath no ulceration  PHYSICAL EXAMINATION:   Vital signs are BP 163/91  Pulse 72  Resp 16  Ht 5\' 5"  (1.651 m)  Wt 212 lb (96.163 kg)  BMI 35.28 kg/m2  SpO2 95% General: The patient appears their  stated age. HEENT:  No gross abnormalities Pulmonary:  Non labored breathing Musculoskeletal: There are no major deformities. Neurologic: No focal weakness or paresthesias are detected, Skin: No open wounds Psychiatric: The patient has normal affect. Cardiovascular: Left leg pitting edema with tenderness along the anterior side of the shin and onto the dorsum of the foot. The foot is warm and well perfused without palpable pulses.   Diagnostic Studies None  Assessment: Left leg cellulitis Plan: I'm going to change the patient's antibiotics and place her on Levaquin to see if this will help with her cellulitis. I have also encouraged her to keep her leg elevated. Unit she's had trouble keeping elevated I want her to avoid daily and a deep dependent position. If this doesn't improve she may require admission for IV antibiotics. We may also want to entertain revascularization to help clear this infection. I scheduled her to come by to see Dr. Bridgett Larsson this Friday  V. Leia Alf, M.D. [facility] [place] Office: [phone] Pager:  [phone]

## 2011-04-29 ENCOUNTER — Encounter: Payer: Self-pay | Admitting: Vascular Surgery

## 2011-04-30 ENCOUNTER — Encounter: Payer: Self-pay | Admitting: Vascular Surgery

## 2011-04-30 ENCOUNTER — Ambulatory Visit (INDEPENDENT_AMBULATORY_CARE_PROVIDER_SITE_OTHER): Payer: Medicare Other | Admitting: *Deleted

## 2011-04-30 ENCOUNTER — Ambulatory Visit (INDEPENDENT_AMBULATORY_CARE_PROVIDER_SITE_OTHER): Payer: Medicare Other | Admitting: Vascular Surgery

## 2011-04-30 VITALS — BP 125/62 | HR 71 | Temp 97.8°F | Ht 65.0 in | Wt 211.0 lb

## 2011-04-30 VITALS — BP 162/88 | HR 76 | Resp 16

## 2011-04-30 DIAGNOSIS — I739 Peripheral vascular disease, unspecified: Secondary | ICD-10-CM | POA: Insufficient documentation

## 2011-04-30 DIAGNOSIS — L0390 Cellulitis, unspecified: Secondary | ICD-10-CM | POA: Insufficient documentation

## 2011-04-30 DIAGNOSIS — L0291 Cutaneous abscess, unspecified: Secondary | ICD-10-CM

## 2011-04-30 DIAGNOSIS — Z48812 Encounter for surgical aftercare following surgery on the circulatory system: Secondary | ICD-10-CM

## 2011-04-30 MED ORDER — LEVOFLOXACIN 750 MG PO TABS: 750.0000 mg | ORAL_TABLET | Freq: Every day | ORAL | Status: AC

## 2011-04-30 NOTE — Progress Notes (Signed)
[facility] [place]  Established Intermittent Claudication  History of Present Illness  Kristina Summers is a 76 y.o. female who presents with chief complaint: left leg pain and swelling.  The pt underwent R CIA stenting ([id number]), complicated by L thigh hematoma.  At this point, pt notes that has completely resolved.  Also her right calf complaints have resolved.  She developed L calf swelling and erythema worrisome for cellulitis.  The pt is an insulin-dependent diabetic and has had multiple episodes of cellulitis in lower leg previously.  She is currently on abx.  The swelling has improved but there continues to be pain in left leg.  Past Medical History, Past Surgical History, Social History, Family History, Medications, Allergies, and Review of Systems are unchanged from previous evaluation on 03/26/11.  Physical Examination  Filed Vitals:   04/30/11 1103  BP: 162/88  Pulse: 76  Resp: 16   General: A&O x 3, WDWN, obese  Pulmonary: Sym exp, good air movt, CTAB, no rales, rhonchi, & wheezing  Cardiac: RRR, Nl S1, S2, no Murmurs, rubs or gallops  Vascular: Vessel Right Left  Radial Palpable Palpable  Brachial Palpable Palpable  Carotid Palpable, without bruit Palpable, without bruit  Aorta Non-palpable N/A  Femoral Palpable Palpable  Popliteal Non-palpable Non-palpable  PT Palpable Weakly Palpable  DP Palpable Weakly Palpable   Musculoskeletal: M/S 5/5 throughout , Extremities without ischemic changes , erythema long ant shin and medial calf with TTP light touch, no streaking  Neurologic: Pain and light touch intact in extremities , Motor exam as listed above  Non-Invasive Vascular Imaging ABI (Date: 04/30/11)  RLE: 0.99, PT and DP: biphasic  LLE: not measured as pt could not tol. cuff, , PT and DP: biphasic  Medical Decision Making  Kristina Summers is a 76 y.o. female who presents with: L leg intermittent claudication without evidence of critical limb  ischemia, R leg IC resolved after R CIA stenting, and L leg cellulitis  I gave the patient 14 days of 750 mg of Levaquin daily and scheduled her follow up with her PCP within the next two weeks  If her cellulitis does not resolve within the next two weeks, she should be admitted to a Int. Med. Service for IV abx  I do not think her L SFA occlusive disease accounts for her cellulitis.    Once she clears her cellulitis, I have recommended we take her back to angio suite for an attempt at subintimal angioplasty of the L SFA to try to address that occlusive disease.  Another option is proceeding with a L fem-AK pop bypass but given her multiple co-morbidities and known CAD, I don't think this is her safest option.  I discussed in depth with the patient the nature of atherosclerosis, and emphasized the importance of maximal medical management including strict control of blood pressure, blood glucose, and lipid levels, obtaining regular exercise, and cessation of smoking.  The patient is aware that without maximal medical management the underlying atherosclerotic disease process will progress, limiting the benefit of any interventions.  She is scheduled to follow up in one month.  Thank you for allowing [place] to participate in this patient's care.  Adele Barthel, MD [facility] [place] Office: [phone] Pager: [phone]

## 2011-05-05 ENCOUNTER — Other Ambulatory Visit: Payer: Self-pay | Admitting: Vascular Surgery

## 2011-05-05 DIAGNOSIS — I70219 Atherosclerosis of native arteries of extremities with intermittent claudication, unspecified extremity: Secondary | ICD-10-CM

## 2011-05-07 ENCOUNTER — Ambulatory Visit: Payer: Medicare Other | Admitting: Vascular Surgery

## 2011-06-01 ENCOUNTER — Encounter (HOSPITAL_BASED_OUTPATIENT_CLINIC_OR_DEPARTMENT_OTHER): Payer: Medicare Other | Attending: General Surgery

## 2011-06-01 DIAGNOSIS — Z79899 Other long term (current) drug therapy: Secondary | ICD-10-CM | POA: Insufficient documentation

## 2011-06-01 DIAGNOSIS — L02419 Cutaneous abscess of limb, unspecified: Secondary | ICD-10-CM | POA: Insufficient documentation

## 2011-06-01 DIAGNOSIS — IMO0002 Reserved for concepts with insufficient information to code with codable children: Secondary | ICD-10-CM | POA: Insufficient documentation

## 2011-06-01 DIAGNOSIS — Z7982 Long term (current) use of aspirin: Secondary | ICD-10-CM | POA: Insufficient documentation

## 2011-06-01 DIAGNOSIS — W19XXXA Unspecified fall, initial encounter: Secondary | ICD-10-CM | POA: Insufficient documentation

## 2011-06-01 DIAGNOSIS — Z951 Presence of aortocoronary bypass graft: Secondary | ICD-10-CM | POA: Insufficient documentation

## 2011-06-01 DIAGNOSIS — E119 Type 2 diabetes mellitus without complications: Secondary | ICD-10-CM | POA: Insufficient documentation

## 2011-06-01 DIAGNOSIS — I2510 Atherosclerotic heart disease of native coronary artery without angina pectoris: Secondary | ICD-10-CM | POA: Insufficient documentation

## 2011-06-01 DIAGNOSIS — Z87891 Personal history of nicotine dependence: Secondary | ICD-10-CM | POA: Insufficient documentation

## 2011-06-01 DIAGNOSIS — I252 Old myocardial infarction: Secondary | ICD-10-CM | POA: Insufficient documentation

## 2011-06-01 DIAGNOSIS — I10 Essential (primary) hypertension: Secondary | ICD-10-CM | POA: Insufficient documentation

## 2011-06-01 DIAGNOSIS — L03119 Cellulitis of unspecified part of limb: Secondary | ICD-10-CM | POA: Insufficient documentation

## 2011-06-01 NOTE — Progress Notes (Signed)
[facility]  NAMESUMMA, LOTSPEICH                  ACCOUNT NO.:  [id number]  MEDICAL RECORD NO.:  [id number]      DATE OF BIRTH:  23-Sep-1935  PHYSICIAN:  Elesa Hacker, M.D.         VISIT DATE:  03-Aug-202013                                  OFFICE VISIT   CHIEF COMPLAINT:  Area of infection, left foreleg.  HISTORY OF PRESENT ILLNESS:  This is a 76 year old lady, diabetic for more than 20 years, who in November had multiple bug bites which she scratched.  She never developed an open wound, but did develop what appeared to be a cellulitis, this was treated successfully with antibiotics and the patient has no wound at present.  PAST MEDICAL HISTORY:  Significant for coronary artery disease and hypertension.  PAST SURGICAL HISTORY:  She has had a femoral bypass on the right side and cardiac bypass.  She is status post MI.  ALLERGIES:  LISINOPRIL causes cough.  MEDICATIONS:  Lasix, omeprazole, metoprolol, Lipitor, losartan, Plavix, and insulin.  Cigarettes, she stopped smoking at the time of her heart attack in 1999. Alcohol none.  REVIEW OF SYSTEMS:  As above.  PHYSICAL EXAMINATION:  VITAL SIGNS:  Temp 97.7, pulse 67, respirations 18, blood pressure 154/83.  GENERAL APPEARANCE:  Awake, alert, in no distress, well developed, well nourished.  EYES, EARS, NOSE, AND THROAT:  Normal.  NECK:  Supple.  CHEST:  Clear.  HEART:  Regular rhythm.  EXTREMITIES:  Reveals no peripheral pulses.  There were no open wounds. There was a scrape on the left knee from a fall that she took 2 days ago.  My impression is status post properly treated cellulitis with no open wounds.  PLAN:  See [place] p.r.n.     Elesa Hacker, M.D.     RA/MEDQ  D:  03-Aug-202013  T:  03-Aug-202013  Job:  [id number]

## 2011-06-03 ENCOUNTER — Encounter: Payer: Self-pay | Admitting: Vascular Surgery

## 2011-06-04 ENCOUNTER — Encounter: Payer: Self-pay | Admitting: Vascular Surgery

## 2011-06-04 ENCOUNTER — Ambulatory Visit (INDEPENDENT_AMBULATORY_CARE_PROVIDER_SITE_OTHER): Payer: Medicare Other | Admitting: Vascular Surgery

## 2011-06-04 VITALS — BP 143/58 | HR 76 | Resp 20 | Ht 65.5 in | Wt 211.0 lb

## 2011-06-04 DIAGNOSIS — I739 Peripheral vascular disease, unspecified: Secondary | ICD-10-CM

## 2011-06-04 DIAGNOSIS — L0291 Cutaneous abscess, unspecified: Secondary | ICD-10-CM

## 2011-06-04 DIAGNOSIS — L0390 Cellulitis, unspecified: Secondary | ICD-10-CM

## 2011-06-04 MED ORDER — SILVER SULFADIAZINE 1 % EX CREA: TOPICAL_CREAM | CUTANEOUS | Status: DC

## 2011-06-04 NOTE — Progress Notes (Signed)
[facility] [place]  Established Intermittent Claudication  History of Present Illness  Kristina Summers is a 76 y.o. (1935-05-22) female who presents with chief complaint: continued left leg pain.  The patient's symptoms have not progressed.  The patient's symptoms are: pain in lower leg at site of prior cellulitis which have progressed to wounds.  The wounds have not healed well over the last 2 months.  The patient's treatment regimen currently included: maximal medical management and antibiotics which been completed.  Additionally, patient is complaining of numbness in left medial thigh  Past Medical History, Past Surgical History, Social History, Family History, Medications, Allergies, and Review of Systems are unchanged from previous evaluation on 04/30/11.  Physical Examination  Filed Vitals:   06/04/11 1156  BP: 143/58  Pulse: 76  Resp: 20  Height: 5' 5.5" (1.664 m)  Weight: 211 lb (95.709 kg)  SpO2: 98%   Body mass index is 34.58 kg/(m^2).  General: A&O x 3, WDWN, obese  Pulmonary: Sym exp, good air movt, CTAB, no rales, rhonchi, & wheezing  Cardiac: RRR, Nl S1, S2, no Murmurs, rubs or gallops  Vascular: Vessel Right Left  Radial Palpable Palpable  Brachial Palpable Palpable  Carotid Palpable, without bruit Palpable, without bruit  Aorta Non-palpable N/A  Femoral Palpable Palpable  Popliteal Non-palpable Non-palpable  PT Palpable Weakly Palpable  DP Palpable Weakly Palpable   Musculoskeletal: M/S 5/5 throughout , Extremities without ischemic changes , healing small ulcers medially in left lower leg, some mild erythema around ulcers, healed petechiae in a dermatomal pattern, no swelling, extensive varicoses on thigh, CVI skin changes bilateral   Neurologic: Pain and light touch intact in extremities, some paresthesia with examination of medial left thigh , Motor exam as listed above  Medical Decision Making  Kristina Summers is a 76 y.o. female who  presents with: poorly healing left lower leg ulcerations (L CLI).   Her R let sx are resolved currently with R CIA stenting.  I started her on BID silvadene to left lower leg to help with infection control.  Based on the patient's vascular studies and examination, I have offered the patient: Left leg angiogram, possible orbital atherectomy, possible angioplasty. I believe the additional blood flow may help her heal her lower leg ulcers.  She is scheduled for the 28 FEB 13. I discussed with the patient the nature of angiographic procedures, especially the limited patencies of any endovascular intervention.  The patient is aware of that the risks of an angiographic procedure include but are not limited to: bleeding, infection, access site complications, renal failure, embolization, rupture of vessel, dissection, possible need for emergent surgical intervention, possible need for surgical procedures to treat the patient's pathology, and stroke and death.  The patient is aware of the risks and agrees to proceed.  I discussed in depth with the patient the nature of atherosclerosis, and emphasized the importance of maximal medical management including strict control of blood pressure, blood glucose, and lipid levels, antiplatelet agents, obtaining regular exercise, and cessation of smoking.  The patient is aware that without maximal medical management the underlying atherosclerotic disease process will progress, limiting the benefit of any interventions.  Thank you for allowing [place] to participate in this patient's care.  Adele Barthel, MD [facility] [place] Office: [phone] Pager: [phone]  06/04/2011, 12:53 PM

## 2011-06-07 ENCOUNTER — Other Ambulatory Visit: Payer: Self-pay | Admitting: *Deleted

## 2011-06-08 ENCOUNTER — Encounter (HOSPITAL_BASED_OUTPATIENT_CLINIC_OR_DEPARTMENT_OTHER): Payer: Medicare Other

## 2011-06-08 ENCOUNTER — Encounter (HOSPITAL_COMMUNITY): Payer: Self-pay | Admitting: Pharmacy Technician

## 2011-06-17 ENCOUNTER — Other Ambulatory Visit: Payer: Self-pay

## 2011-06-17 ENCOUNTER — Encounter (HOSPITAL_COMMUNITY)
Admission: RE | Disposition: A | Discharge status: Home / Self Care | Payer: Self-pay | Source: Ambulatory Visit | Attending: Vascular Surgery

## 2011-06-17 ENCOUNTER — Observation Stay (HOSPITAL_COMMUNITY)
Admission: RE | Admit: 2011-06-17 | Discharge: 2011-06-18 | Disposition: A | Discharge status: Home / Self Care | Payer: Medicare Other | Source: Ambulatory Visit | Attending: Vascular Surgery | Admitting: Vascular Surgery

## 2011-06-17 DIAGNOSIS — I739 Peripheral vascular disease, unspecified: Principal | ICD-10-CM | POA: Insufficient documentation

## 2011-06-17 DIAGNOSIS — I10 Essential (primary) hypertension: Secondary | ICD-10-CM | POA: Insufficient documentation

## 2011-06-17 DIAGNOSIS — L97909 Non-pressure chronic ulcer of unspecified part of unspecified lower leg with unspecified severity: Secondary | ICD-10-CM | POA: Insufficient documentation

## 2011-06-17 DIAGNOSIS — L98499 Non-pressure chronic ulcer of skin of other sites with unspecified severity: Principal | ICD-10-CM | POA: Insufficient documentation

## 2011-06-17 DIAGNOSIS — I2510 Atherosclerotic heart disease of native coronary artery without angina pectoris: Secondary | ICD-10-CM | POA: Insufficient documentation

## 2011-06-17 DIAGNOSIS — I4891 Unspecified atrial fibrillation: Secondary | ICD-10-CM | POA: Insufficient documentation

## 2011-06-17 DIAGNOSIS — I743 Embolism and thrombosis of arteries of the lower extremities: Secondary | ICD-10-CM

## 2011-06-17 DIAGNOSIS — E119 Type 2 diabetes mellitus without complications: Secondary | ICD-10-CM | POA: Insufficient documentation

## 2011-06-17 DIAGNOSIS — I252 Old myocardial infarction: Secondary | ICD-10-CM | POA: Insufficient documentation

## 2011-06-17 HISTORY — PX: LOWER EXTREMITY ANGIOGRAM: SHX5508

## 2011-06-17 HISTORY — PX: ANGIOPLASTY: SHX39

## 2011-06-17 LAB — POCT ACTIVATED CLOTTING TIME
Activated Clotting Time: 254 seconds
Activated Clotting Time: 193 seconds

## 2011-06-17 LAB — CBC
MCH: 31.9 pg (ref 26.0–34.0)
MCV: 92.1 fL (ref 78.0–100.0)
Platelets: 224 10*3/uL (ref 150–400)
RBC: 4.57 MIL/uL (ref 3.87–5.11)

## 2011-06-17 LAB — CARDIAC PANEL(CRET KIN+CKTOT+MB+TROPI)
CK, MB: 4.4 ng/mL — ABNORMAL HIGH (ref 0.3–4.0)
Total CK: 147 U/L (ref 7–177)

## 2011-06-17 SURGERY — ANGIOGRAM, LOWER EXTREMITY
Anesthesia: LOCAL | Laterality: Left

## 2011-06-17 MED ORDER — HYDRALAZINE HCL 20 MG/ML IJ SOLN: 10.0000 mg | INTRAMUSCULAR | Status: DC | PRN

## 2011-06-17 MED ORDER — METOPROLOL TARTRATE 1 MG/ML IV SOLN: 2.0000 mg | INTRAVENOUS | Status: DC | PRN

## 2011-06-17 MED ORDER — ONDANSETRON HCL 4 MG/2ML IJ SOLN: 4.0000 mg | Freq: Four times a day (QID) | INTRAMUSCULAR | Status: DC | PRN

## 2011-06-17 MED ORDER — LIDOCAINE HCL (PF) 1 % IJ SOLN
INTRAMUSCULAR | Status: AC
  Filled 2011-06-17: qty 30

## 2011-06-17 MED ORDER — LOSARTAN POTASSIUM 50 MG PO TABS
50.0000 mg | ORAL_TABLET | Freq: Two times a day (BID) | ORAL | Status: DC
  Administered 2011-06-17 – 2011-06-18 (×2): 50 mg via ORAL
  Filled 2011-06-17 (×5): qty 1

## 2011-06-17 MED ORDER — ACETAMINOPHEN 650 MG RE SUPP: 325.0000 mg | RECTAL | Status: DC | PRN

## 2011-06-17 MED ORDER — FUROSEMIDE 20 MG PO TABS: 20.0000 mg | ORAL_TABLET | Freq: Two times a day (BID) | ORAL | Status: DC

## 2011-06-17 MED ORDER — METOPROLOL TARTRATE 25 MG PO TABS
25.0000 mg | ORAL_TABLET | Freq: Two times a day (BID) | ORAL | Status: DC
  Administered 2011-06-17 – 2011-06-18 (×2): 25 mg via ORAL
  Filled 2011-06-17 (×5): qty 1

## 2011-06-17 MED ORDER — NITROGLYCERIN IN D5W 200-5 MCG/ML-% IV SOLN
INTRAVENOUS | Status: AC
  Filled 2011-06-17: qty 250

## 2011-06-17 MED ORDER — FUROSEMIDE 20 MG PO TABS
60.0000 mg | ORAL_TABLET | ORAL | Status: DC
  Administered 2011-06-18: 60 mg via ORAL
  Filled 2011-06-17 (×4): qty 1

## 2011-06-17 MED ORDER — PHENOL 1.4 % MT LIQD
1.0000 | OROMUCOSAL | Status: DC | PRN
  Filled 2011-06-17: qty 177

## 2011-06-17 MED ORDER — HYDRALAZINE HCL 20 MG/ML IJ SOLN
INTRAMUSCULAR | Status: AC
  Filled 2011-06-17: qty 1

## 2011-06-17 MED ORDER — OXYCODONE HCL 5 MG PO TABS: 5.0000 mg | ORAL_TABLET | ORAL | Status: DC | PRN

## 2011-06-17 MED ORDER — SODIUM CHLORIDE 0.9 % IV SOLN: 1.0000 mL/kg/h | INTRAVENOUS | Status: DC

## 2011-06-17 MED ORDER — MIDAZOLAM HCL 2 MG/2ML IJ SOLN
INTRAMUSCULAR | Status: AC
  Filled 2011-06-17: qty 2

## 2011-06-17 MED ORDER — HYDROMORPHONE HCL PF 1 MG/ML IJ SOLN: 0.5000 mg | INTRAMUSCULAR | Status: DC | PRN

## 2011-06-17 MED ORDER — HEPARIN SODIUM (PORCINE) 1000 UNIT/ML IJ SOLN
INTRAMUSCULAR | Status: AC
  Filled 2011-06-17: qty 1

## 2011-06-17 MED ORDER — ONDANSETRON HCL 4 MG/2ML IJ SOLN
4.0000 mg | Freq: Four times a day (QID) | INTRAMUSCULAR | Status: DC | PRN
  Administered 2011-06-17: 4 mg via INTRAVENOUS

## 2011-06-17 MED ORDER — VERAPAMIL HCL 2.5 MG/ML IV SOLN
INTRAVENOUS | Status: AC
  Filled 2011-06-17: qty 2

## 2011-06-17 MED ORDER — ACETAMINOPHEN 325 MG PO TABS: 325.0000 mg | ORAL_TABLET | ORAL | Status: DC | PRN

## 2011-06-17 MED ORDER — FENTANYL CITRATE 0.05 MG/ML IJ SOLN
INTRAMUSCULAR | Status: AC
  Filled 2011-06-17: qty 2

## 2011-06-17 MED ORDER — LABETALOL HCL 5 MG/ML IV SOLN: 10.0000 mg | INTRAVENOUS | Status: DC | PRN

## 2011-06-17 MED ORDER — LABETALOL HCL 5 MG/ML IV SOLN
10.0000 mg | INTRAVENOUS | Status: DC | PRN
  Filled 2011-06-17: qty 4

## 2011-06-17 MED ORDER — FUROSEMIDE 20 MG PO TABS
20.0000 mg | ORAL_TABLET | Freq: Every evening | ORAL | Status: DC
  Filled 2011-06-17 (×2): qty 1

## 2011-06-17 MED ORDER — PANTOPRAZOLE SODIUM 40 MG PO TBEC
40.0000 mg | DELAYED_RELEASE_TABLET | Freq: Every day | ORAL | Status: DC
  Administered 2011-06-18: 40 mg via ORAL
  Filled 2011-06-17: qty 1

## 2011-06-17 MED ORDER — ACETAMINOPHEN 325 MG PO TABS: 650.0000 mg | ORAL_TABLET | ORAL | Status: DC | PRN

## 2011-06-17 MED ORDER — MORPHINE SULFATE 4 MG/ML IJ SOLN
2.0000 mg | INTRAMUSCULAR | Status: DC | PRN
  Administered 2011-06-17: 2 mg via INTRAVENOUS

## 2011-06-17 MED ORDER — GUAIFENESIN-DM 100-10 MG/5ML PO SYRP: 15.0000 mL | ORAL_SOLUTION | ORAL | Status: DC | PRN

## 2011-06-17 MED ORDER — MORPHINE SULFATE 4 MG/ML IJ SOLN
INTRAMUSCULAR | Status: AC
  Filled 2011-06-17: qty 1

## 2011-06-17 MED ORDER — ENOXAPARIN SODIUM 40 MG/0.4ML ~~LOC~~ SOLN
40.0000 mg | SUBCUTANEOUS | Status: DC
  Filled 2011-06-17: qty 0.4

## 2011-06-17 MED ORDER — ATORVASTATIN CALCIUM 80 MG PO TABS
80.0000 mg | ORAL_TABLET | Freq: Every day | ORAL | Status: DC
  Administered 2011-06-18: 80 mg via ORAL
  Filled 2011-06-17 (×2): qty 1

## 2011-06-17 MED ORDER — ONDANSETRON HCL 4 MG/2ML IJ SOLN
INTRAMUSCULAR | Status: AC
  Filled 2011-06-17: qty 2

## 2011-06-17 MED ORDER — HEPARIN (PORCINE) IN NACL 2-0.9 UNIT/ML-% IJ SOLN
INTRAMUSCULAR | Status: AC
  Filled 2011-06-17: qty 1000

## 2011-06-17 MED ORDER — ENOXAPARIN SODIUM 40 MG/0.4ML ~~LOC~~ SOLN
40.0000 mg | SUBCUTANEOUS | Status: DC
  Administered 2011-06-18: 40 mg via SUBCUTANEOUS
  Filled 2011-06-17: qty 0.4

## 2011-06-17 MED ORDER — CLOPIDOGREL BISULFATE 75 MG PO TABS
75.0000 mg | ORAL_TABLET | Freq: Every day | ORAL | Status: DC
  Administered 2011-06-18: 75 mg via ORAL
  Filled 2011-06-17 (×2): qty 1

## 2011-06-17 MED ORDER — SODIUM CHLORIDE 0.9 % IV SOLN
INTRAVENOUS | Status: DC
  Administered 2011-06-17: 23:00:00 via INTRAVENOUS

## 2011-06-17 MED ORDER — OXYCODONE-ACETAMINOPHEN 5-325 MG PO TABS: 1.0000 | ORAL_TABLET | ORAL | Status: DC | PRN

## 2011-06-17 MED ORDER — HYDRALAZINE HCL 20 MG/ML IJ SOLN
10.0000 mg | INTRAMUSCULAR | Status: DC | PRN
  Administered 2011-06-17: 10 mg via INTRAVENOUS
  Filled 2011-06-17: qty 0.5

## 2011-06-17 NOTE — Op Note (Signed)
OPERATIVE NOTE   PROCEDURE: 1.  Right common femoral artery cannulation under ultrasound guidance 2.  Left leg runoff 3.  Orbital atherectomy of left superficial femoral artery  4.  Angioplasty of left superficial femoral artery  5.  Attempted suction thrombectomy of left above-the-knee popliteal artery  PRE-OPERATIVE DIAGNOSIS: Poorly healing left lower leg ulcers  POST-OPERATIVE DIAGNOSIS: same as above   SURGEON: Adele Barthel, MD  ANESTHESIA: conscious sedation  ESTIMATED BLOOD LOSS: 50 cc  CONTRAST: 150 cc  FINDING(S):  Patent left common femoral artery, profunda femoral artery, and superficial femoral artery (heavily diseased)  Widely patent left popliteal artery with calcific above-the-knee segment  Widely patent left trifurcation with three vessel runoff to foot Superficial femoral artery: multiple calcific stenoses including 4 >75% stenoses, including a near occluded stenosis: near resolved after orbital atherectomy and angioplasty No thrombus on attempted aspiration thrombectomy of above-the-knee popliteal artery  SPECIMEN(S):  none  INDICATIONS:   Kristina Summers is a 76 y.o. female who presents with poorly healing left lower leg ulcers.  On her previous intervention, I noted multiple calcific lesions in the left superficial femoral artery but a left femoral access had been done to complete her right iliac stenting, so immediate intervention was not possible.  On follow up she continued to have poorly healing left lower leg ulcers, so I felt that an attempt at intervention on the left leg might improve her healing potential.  I discussed with the patient the nature of angiographic procedures, especially the limited patencies of any endovascular intervention.  The patient is aware of that the risks of an angiographic procedure include but are not limited to: bleeding, infection, access site complications, renal failure, embolization, rupture of vessel, dissection, possible need  for emergent surgical intervention, possible need for surgical procedures to treat the patient's pathology, and stroke and death.  The patient is aware of the risks and agrees to proceed.  DESCRIPTION: After full informed consent was obtained from the patient, the patient was brought back to the angiography suite.  The patient was placed supine upon the angiography table and connected to monitoring equipment.  The patient was then given conscious sedation, the amounts of which are documented in the patient's chart.  The patient was prepped and drape in the standard fashion for an angiographic procedure.  At this point, attention was turned to the right groin.  Under ultrasound guidance, the right common femoral artery will be cannulated with a 18 gauge needle.  The [facility] wire was passed up into the aorta.  The needle was exchanged for an end hole catheter which was advanced over the wire into the aorta.  The wire was exchanged for a Amplatz wire.  The 5-Fr sheath was advanced over the wire into the common femoral artery.  The dilator was then removed.  The Crossover catheter was then loaded over the wire.  Using the [facility] and Crossover catheter, the left common iliac artery was selected.  The catheter and wire were advanced into the external iliac artery.  An automated right leg runoff was completed.  Based on the imaging, I felt an attempt at orbital atherectomy and angioplasty of the left superficial femoral artery was indicated.  The wire was exchanged for a Amplatz wire.  The patient's right femoral sheath was exchanged for a 6-Fr Destination sheath, which was lodged in the left external iliac artery.  The dilator was removed.  Using a Glidecath and Glidewire, I navigated into the superficial femoral artery and was able  to traverse all of the stenoses in the superficial femoral artery.  I exchanged the wire out for a 0.018" Viper wire.  Over this wire, we loaded a large Emboshield embolic protection device,  into the above the knee popliteal artery where it was deployed.  We then selected a 2 mm burr for the [facility] orbital atherectomy device.  I used the [employer] device at three passes of 60K RPM, 90K RPM and 120K RPM with the device, giving nitroglycerin between passes.  I then selected a 5 mm x 100 mm angioplasty balloon.  The [facility] device was exchanged for the angioplasty balloon which was inflated to profile for 2 minutes.  The visualized waist resolved with angioplasty.  The completion imaging demonstrated near resolution of the serial stenoses in the distal superficial femoral artery.  I then angioplastied a proximal calcified segment of the superficial femoral artery with the same balloon at 10 atm for 2 minutes.  This demonstrated minimal response without any dissection or rupture.  At this point, I placed the Emboshield retrieval catheter over the wire and removed the embolic protection device with the wire.  A completion angiogram demonstrated a suspicious appearance to the above-the-knee popliteal artery, concerning for thrombus.  I replaced the Glidewire and Glidecath down into the above the knee popliteal artery and exchanged the wire for a 0.014" Spartacore.  A 6-Fr aspiration catheter was loaded over the wire and used to suction the above-the-knee popliteal artery.  Absolutely no thrombus was aspirated.  The catheter and wire were removed.  Another completion angiogram demonstrated to no change in the above the knee popliteal artery, suggesting that in fact the appearance of this segment of the artery is due to calcification and not thrombus.  After reviewing the prior left leg runoff, it becomes evident that this calcific pattern was present in the above the knee popliteal artery prior to intervention.  I suspect with the increased blood flow in the left popliteal artery, the calcification is more visible compared to prior, when there was underfilling.  At this point, I pulled the sheath  back into the right external iliac artery.  The sheath was aspirated.  No clots were present and the sheath was reloaded with heparinized saline.  The plan is to pull the sheath after anticoagulation has reversed.  COMPLICATIONS: none  CONDITION: stable   Adele Barthel, MD [facility] [place] Office: [phone] Pager: [phone]  06/17/2011, 3:26 PM

## 2011-06-17 NOTE — H&P (Signed)
[facility] [place]  History and Physical  History of Present Illness  Kristina Summers is a 76 y.o. female who presents with chief complaint: poorly healing left lower leg ulcers.  The patient presents today for: left leg angiogram, possible intervention.    Past Medical History  Diagnosis Date  . CAD (coronary artery disease)   . Obesity   . Atrial fibrillation   . Hypertension   . Diabetes mellitus   . Hyperlipidemia   . Peripheral vascular disease   . Dizziness   . Carpal tunnel syndrome   . GI bleed 1/11  . Carotid artery occlusion   . Normal nuclear stress test 03/18/2011  . Angina   . GERD (gastroesophageal reflux disease)   . Arthritis   . Fibromyalgia   . Myocardial infarction ~ 1991    Past Surgical History  Procedure Date  . Trigger finger release 1996    right thumb  . Cataract extraction w/ intraocular lens  implant, bilateral 2004-2005  . Coronary artery bypass graft 2000    CABG X5  . Peripheral arterial stent graft 04/01/11    right common iliac    History   Social History  . Marital Status: Divorced    Spouse Name: N/A    Number of Children: N/A  . Years of Education: N/A   Occupational History  . Not on file.   Social History Main Topics  . Smoking status: Former Smoker -- 2.0 packs/day for 30 years    Types: Cigarettes  . Smokeless tobacco: Never Used   Comment: stopped smoking cigarettes 1991  . Alcohol Use: No  . Drug Use: No  . Sexually Active: No   Other Topics Concern  . Not on file   Social History Narrative  . No narrative on file    No family history on file.  No current facility-administered medications on file prior to encounter.   Current Outpatient Prescriptions on File Prior to Encounter  Medication Sig Dispense Refill  . atorvastatin (LIPITOR) 80 MG tablet Take 80 mg by mouth daily.        . clopidogrel (PLAVIX) 75 MG tablet Take 1 tablet (75 mg total) by mouth daily.  30 tablet  11  .  furosemide (LASIX) 40 MG tablet Take 20-60 mg by mouth 2 (two) times daily. Pt takes 1.5 tabs (60mg ) in the morning and 1/2 tablet (20mg ) in the evening      . insulin NPH (HUMULIN N,NOVOLIN N) 100 UNIT/ML injection Inject 34-38 Units into the skin 2 (two) times daily. 38 units in the morning and 34 units in the evening.       Marland Kitchen losartan (COZAAR) 50 MG tablet Take 50 mg by mouth 2 (two) times daily.        . metoprolol tartrate (LOPRESSOR) 25 MG tablet Take 25 mg by mouth 2 (two) times daily.        . Multiple Vitamin (MULTIVITAMIN) tablet Take 1 tablet by mouth daily.      Marland Kitchen omeprazole (PRILOSEC) 40 MG capsule Take 40 mg by mouth daily.        . silver sulfADIAZINE (SILVADENE) 1 % cream Apply to affected area BID  50 g  1    Allergies  Allergen Reactions  . Lisinopril Cough  . Sertraline Hcl     REACTION: nausea    Review of Systems: Kidney Disease, As listed above, otherwise negative.  Physical Examination  Filed Vitals:   06/17/11 0916  BP:  173/72  Pulse: 62  Temp: 96.8 F (36 C)  TempSrc: Oral  Resp: 18  Height: 5' 5.5" (1.664 m)  Weight: 212 lb (96.163 kg)  SpO2: 96%   Body mass index is 34.74 kg/(m^2).  General: A&O x 3, WDWN, obese  Pulmonary: Sym exp, good air movt, CTAB, no rales, rhonchi, & wheezing  Cardiac: RRR, Nl S1, S2, no Murmurs, rubs or gallops  Gastrointestinal: soft, NTND, -G/R, - HSM, - masses, - CVAT B  Musculoskeletal: M/S 5/5 throughout , Extremities without ischemic changes , left leg small ulcers with some surrounding erythema  Laboratory See iStat  Medical Decision Making  Kristina Summers is a 76 y.o. female who presents with: left leg PAD and poorly healing left lower leg ulcers.   The patient is scheduled for: left leg angiogram, possible intervention.   I discussed with the patient the nature of angiographic procedures, especially the limited patencies of any endovascular intervention.  The patient is aware of that the risks of an  angiographic procedure include but are not limited to: bleeding, infection, access site complications, renal failure, embolization, rupture of vessel, dissection, possible need for emergent surgical intervention, possible need for surgical procedures to treat the patient's pathology, and stroke and death.    The patient is aware of the risks and agrees to proceed.  Adele Barthel, MD [facility] [place] Office: [phone] Pager: [phone]  06/17/2011, 1:08 PM

## 2011-06-18 LAB — CBC
Hemoglobin: 12.1 g/dL (ref 12.0–15.0)
MCH: 30.4 pg (ref 26.0–34.0)
MCV: 94.7 fL (ref 78.0–100.0)
RBC: 3.98 MIL/uL (ref 3.87–5.11)

## 2011-06-18 LAB — BASIC METABOLIC PANEL
CO2: 26 mEq/L (ref 19–32)
Calcium: 8.7 mg/dL (ref 8.4–10.5)
Glucose, Bld: 345 mg/dL — ABNORMAL HIGH (ref 70–99)
Potassium: 4.2 mEq/L (ref 3.5–5.1)
Sodium: 136 mEq/L (ref 135–145)

## 2011-06-18 LAB — POCT I-STAT, CHEM 8
BUN: 26 mg/dL — ABNORMAL HIGH (ref 6–23)
Calcium, Ion: 1.19 mmol/L (ref 1.12–1.32)
Creatinine, Ser: 0.7 mg/dL (ref 0.50–1.10)
Glucose, Bld: 182 mg/dL — ABNORMAL HIGH (ref 70–99)
TCO2: 28 mmol/L (ref 0–100)

## 2011-06-18 NOTE — Progress Notes (Signed)
[facility] [place]  Daily Progress Note  Assessment/Planning: POD #1 s/p L SFA Orbital atherectomy & PTA    EKG and Cardiac enzymes negative  D/C home from obs status  Subjective  - 1 Day Post-Op  No events overnight, back pain gone  Objective Filed Vitals:   06/17/11 0916 06/17/11 2005 06/17/11 2358 06/18/11 0404  BP: 173/72 152/52 96/53 111/68  Pulse: 62 84 77 65  Temp: 96.8 F (36 C) 98.6 F (37 C) 98.5 F (36.9 C) 97.8 F (36.6 C)  TempSrc: Oral Oral Oral Oral  Resp: 18 19 16 6   Height: 5' 5.5" (1.664 m)     Weight: 212 lb (96.163 kg) 211 lb 6.7 oz (95.9 kg)    SpO2: 96% 95% 95% 96%   No intake or output data in the 24 hours ending 06/18/11 0750  PULM  CTAB CV  RRR GI  soft, NTND VASC  R groin without hematoma or PSA, pedal pulses palpable in both feet   Laboratory CBC    Component Value Date/Time   WBC 8.3 06/18/2011 0635   HGB 12.1 06/18/2011 0635   HCT 37.7 06/18/2011 0635   PLT 216 06/18/2011 0635    BMET    Component Value Date/Time   NA 141 04/01/2011 1052   K 3.9 04/01/2011 1052   CL 106 04/01/2011 1052   CO2 25 05/20/2009 2055   GLUCOSE 156* 04/01/2011 1052   BUN 17 04/01/2011 1052   CREATININE 0.61 06/17/2011 2214   CALCIUM 9.4 05/20/2009 2055   GFRNONAA 88* 06/17/2011 2214   GFRAA >90 06/17/2011 2214    Adele Barthel, MD [facility] [place] Office: [phone] Pager: [phone]  06/18/2011, 7:50 AM

## 2011-06-18 NOTE — Progress Notes (Signed)
Pt. Discharged 06/18/2011  3:52 PM Discharge instructions reviewed with patient/family. Patient/family verbalized understanding. All Rx's given. Questions answered as needed. Pt. Discharged to home with family/self. Taken off unit via W/C. Yaziel Brandon

## 2011-06-20 NOTE — Discharge Summary (Signed)
Physician Discharge Summary  Patient ID: Kristina Summers MRN: [id number] DOB/AGE: 74/74/1937 74 y.o.  Admit date: 06/17/2011 Discharge date: 06/20/2011  Admission Diagnosis: Left leg ulcers  Discharge Diagnoses:  Left leg ulcers  Procedures: 1. Right common femoral artery cannulation under ultrasound guidance  2. Left leg runoff  3. Orbital atherectomy of left superficial femoral artery  4. Angioplasty of left superficial femoral artery  5. Attempted suction thrombectomy of left above-the-knee popliteal artery  Discharged Condition: stable  Hospital Course:  The patient was admitted over night for observational status due to late time for discharge.  Significant Diagnostic Studies: CBC    Component Value Date/Time   WBC 8.3 06/18/2011 0635   RBC 3.98 06/18/2011 0635   HGB 12.1 06/18/2011 0635   HCT 37.7 06/18/2011 0635   PLT 216 06/18/2011 0635   MCV 94.7 06/18/2011 0635   MCH 30.4 06/18/2011 0635   MCHC 32.1 06/18/2011 0635   RDW 13.3 06/18/2011 0635   LYMPHSABS 1.4 05/20/2009 2055   MONOABS 0.6 05/20/2009 2055   EOSABS 0.4 05/20/2009 2055   BASOSABS 0.1 05/20/2009 2055    BMET    Component Value Date/Time   NA 136 06/18/2011 0635   K 4.2 06/18/2011 0635   CL 104 06/18/2011 0635   CO2 26 06/18/2011 0635   GLUCOSE 345* 06/18/2011 0635   BUN 26* 06/18/2011 0635   CREATININE 0.83 06/18/2011 0635   CALCIUM 8.7 06/18/2011 0635   GFRNONAA 68* 06/18/2011 0635   GFRAA 79* 06/18/2011 0635    COAG Lab Results  Component Value Date   INR 1.23 04/26/2009   INR 1.40 04/25/2009   INR 1.48 04/23/2009   No results found for this basename: PTT    Disposition: 01-Home or Self Care  Discharge Orders    Future Orders Please Complete By Expires   Resume previous diet      Driving Restrictions      Comments:   No driving while taking narcotics.   Lifting restrictions      Comments:   No lifting > 10 lb for 2 weeks   Call MD for:  temperature >100.5      Call MD for:  redness, tenderness, or signs of infection (pain,  swelling, bleeding, redness, odor or green/yellow discharge around incision site)      Call MD for:  severe or increased pain, loss or decreased feeling  in affected limb(s)      Increase activity slowly      Comments:   Walk with assistance use walker or cane as needed     Medication List  As of 06/20/2011  8:49 PM   TAKE these medications         atorvastatin 80 MG tablet   Commonly known as: LIPITOR   Take 80 mg by mouth daily.      clopidogrel 75 MG tablet   Commonly known as: PLAVIX   Take 1 tablet (75 mg total) by mouth daily.      furosemide 40 MG tablet   Commonly known as: LASIX   Take 20-60 mg by mouth 2 (two) times daily. Pt takes 1.5 tabs (60mg ) in the morning and 1/2 tablet (20mg ) in the evening      insulin NPH 100 UNIT/ML injection   Commonly known as: HUMULIN N,NOVOLIN N   Inject 34-38 Units into the skin 2 (two) times daily. 38 units in the morning and 34 units in the evening.      losartan 50 MG tablet  Commonly known as: COZAAR   Take 50 mg by mouth 2 (two) times daily.      metoprolol tartrate 25 MG tablet   Commonly known as: LOPRESSOR   Take 25 mg by mouth 2 (two) times daily.      multivitamin tablet   Take 1 tablet by mouth daily.      omeprazole 40 MG capsule   Commonly known as: PRILOSEC   Take 40 mg by mouth daily.      silver sulfADIAZINE 1 % cream   Commonly known as: SILVADENE   Apply to affected area BID           Follow-up Information    Follow up with Hinda Lenis, MD in 4 weeks.   Contact information:   [address] [place] [phone]          SignedHinda Lenis 06/20/2011, 8:49 PM

## 2011-06-29 ENCOUNTER — Encounter (HOSPITAL_BASED_OUTPATIENT_CLINIC_OR_DEPARTMENT_OTHER): Payer: Medicare Other

## 2011-07-02 ENCOUNTER — Encounter (HOSPITAL_COMMUNITY): Payer: Self-pay

## 2011-07-02 ENCOUNTER — Emergency Department (INDEPENDENT_AMBULATORY_CARE_PROVIDER_SITE_OTHER)
Admission: EM | Admit: 2011-07-02 | Discharge: 2011-07-02 | Disposition: A | Discharge status: Home / Self Care | Payer: Medicare Other | Source: Home / Self Care | Attending: Emergency Medicine | Admitting: Emergency Medicine

## 2011-07-02 DIAGNOSIS — L03116 Cellulitis of left lower limb: Secondary | ICD-10-CM

## 2011-07-02 DIAGNOSIS — L03119 Cellulitis of unspecified part of limb: Secondary | ICD-10-CM

## 2011-07-02 MED ORDER — DOXYCYCLINE HYCLATE 100 MG PO CAPS: 100.0000 mg | ORAL_CAPSULE | Freq: Two times a day (BID) | ORAL | Status: AC

## 2011-07-02 NOTE — Discharge Instructions (Signed)
Continue applying silvadene cream daily and covering with a dressing. Begin the oral antibiotic. If symptoms change or worsen, return or follow up with your primary care dr at [facility]. [facility] your [facility].   Cellulitis Cellulitis is an infection of the tissue under the skin. The infected area is usually red and tender. This is caused by germs. These germs enter the body through cuts or sores. This usually happens in the arms or lower legs. HOME CARE   Take your medicine as told. Finish it even if you start to feel better.   If the infection is on the arm or leg, keep it raised (elevated).   Use a warm cloth on the infected area several times a day.   See your doctor for a follow-up visit as told.  GET HELP RIGHT AWAY IF:   You are tired or confused.   You throw up (vomit).   You have watery poop (diarrhea).   You feel ill and have muscle aches.   You have a fever.  MAKE SURE YOU:   Understand these instructions.   Will watch your condition.   Will get help right away if you are not doing well or get worse.  Document Released: 09/22/2007 Document Revised: 03/25/2011 Document Reviewed: 03/07/2009 [facility] Patient Information 2012 [place], [place].

## 2011-07-02 NOTE — ED Provider Notes (Signed)
History     CSN: [id number]  Arrival date & time 07/02/11  63   First MD Initiated Contact with Patient 07/02/11 1513      Chief Complaint  Patient presents with  . Skin Problem    (Consider location/radiation/quality/duration/timing/severity/associated sxs/prior treatment) HPI Comments: Patient presents today stating that she has cellulitis in her left lower leg. She states that this is an ongoing problem and has not changed or worsened. She had an angiogram 2 weeks ago due to chronic ulcers of her left lower extremity. She states that the leg is feeling better but the cellulitis process. She is applying Silvadene cream daily. She has a followup appointment at the [facility] in 2 weeks at her next appointment [facility]. She was unable to get in earlier with her primary care provider and was advised to come to urgent care for evaluation. Patient is uncertain of which oral antibiotics she has taken for her cellulitis in the past.   Past Medical History  Diagnosis Date  . CAD (coronary artery disease)   . Obesity   . Atrial fibrillation   . Hypertension   . Diabetes mellitus   . Hyperlipidemia   . Peripheral vascular disease   . Dizziness   . Carpal tunnel syndrome   . GI bleed 1/11  . Carotid artery occlusion   . Normal nuclear stress test 03/18/2011  . Angina   . GERD (gastroesophageal reflux disease)   . Arthritis   . Fibromyalgia   . Myocardial infarction ~ 1991    Past Surgical History  Procedure Date  . Trigger finger release 1996    right thumb  . Cataract extraction w/ intraocular lens  implant, bilateral 2004-2005  . Coronary artery bypass graft 2000    CABG X5  . Peripheral arterial stent graft 04/01/11    right common iliac    History reviewed. No pertinent family history.  History  Substance Use Topics  . Smoking status: Former Smoker -- 2.0 packs/day for 30 years    Types: Cigarettes  .  Smokeless tobacco: Never Used   Comment: stopped smoking cigarettes 1991  . Alcohol Use: Not on file    OB History    Grav Para Term Preterm Abortions TAB SAB Ect Mult Living                  Review of Systems  Constitutional: Negative for fever and chills.  Respiratory: Negative for cough and shortness of breath.   Cardiovascular: Negative for chest pain.  Musculoskeletal: Negative for joint swelling.  Skin: Positive for color change.    Allergies  Lisinopril and Sertraline hcl  Home Medications   Current Outpatient Rx  Name Route Sig Dispense Refill  . ATORVASTATIN CALCIUM 80 MG PO TABS Oral Take 80 mg by mouth daily.      Marland Kitchen CLOPIDOGREL BISULFATE 75 MG PO TABS Oral Take 1 tablet (75 mg total) by mouth daily. 30 tablet 11  . DOXYCYCLINE HYCLATE 100 MG PO CAPS Oral Take 1 capsule (100 mg total) by mouth 2 (two) times daily. 20 capsule 0  . FUROSEMIDE 40 MG PO TABS Oral Take 20-60 mg by mouth 2 (two) times daily. Pt takes 1.5 tabs (60mg ) in the morning and 1/2 tablet (20mg ) in the evening    . INSULIN ISOPHANE HUMAN 100 UNIT/ML [place] SUSP Subcutaneous Inject 34-38 Units into the skin 2 (two) times daily. 38 units in the  morning and 34 units in the evening.     Marland Kitchen LOSARTAN POTASSIUM 50 MG PO TABS Oral Take 50 mg by mouth 2 (two) times daily.      Marland Kitchen METOPROLOL TARTRATE 25 MG PO TABS Oral Take 25 mg by mouth 2 (two) times daily.      Marland Kitchen ONE-DAILY MULTI VITAMINS PO TABS Oral Take 1 tablet by mouth daily.    Marland Kitchen OMEPRAZOLE 40 MG PO CPDR Oral Take 40 mg by mouth daily.      Marland Kitchen SILVER SULFADIAZINE 1 % EX CREA  Apply to affected area BID 50 g 1    BP 135/87  Pulse 74  Temp(Src) 98 F (36.7 C) (Oral)  Resp 18  SpO2 95%  Physical Exam  Nursing note and vitals reviewed. Constitutional: She appears well-developed and well-nourished. No distress.  HENT:  Head: Normocephalic and atraumatic.  Cardiovascular: Normal rate, regular rhythm and normal heart sounds.   Pulmonary/Chest: Effort  normal and breath sounds normal. No respiratory distress.  Musculoskeletal:       Legs: Neurological: She is alert.  Skin: Skin is warm and dry.  Psychiatric: She has a normal mood and affect.    ED Course  Procedures (including critical care time)  Labs Reviewed - No data to display No results found.   1. Cellulitis of leg without foot, left       MDM  Persistent cellulitis LLE with PAD.         Carmel [place], [place] 07/02/11 [phone]

## 2011-07-02 NOTE — ED Notes (Signed)
Pt left anterior lower leg is red, tender, warm to touch, and dry for about 2 weeks. Pt states that her PCP gave her white cream and antibiotic but symptoms still are present. Pt states her pain level is 2. Pt states that she has not taken anything today for pain.

## 2011-07-06 ENCOUNTER — Other Ambulatory Visit: Payer: Self-pay

## 2011-07-06 ENCOUNTER — Encounter (HOSPITAL_BASED_OUTPATIENT_CLINIC_OR_DEPARTMENT_OTHER): Payer: Medicare Other

## 2011-07-06 MED ORDER — SILVER SULFADIAZINE 1 % EX CREA: TOPICAL_CREAM | CUTANEOUS | Status: DC

## 2011-07-06 NOTE — ED Provider Notes (Signed)
Medical screening examination/treatment/procedure(s) were performed by non-physician practitioner and as supervising physician I was immediately available for consultation/collaboration.  Nicholas Lose, MD 07/06/11 [phone]

## 2011-07-29 ENCOUNTER — Encounter: Payer: Self-pay | Admitting: Vascular Surgery

## 2011-07-30 ENCOUNTER — Encounter: Payer: Self-pay | Admitting: Vascular Surgery

## 2011-07-30 ENCOUNTER — Ambulatory Visit (INDEPENDENT_AMBULATORY_CARE_PROVIDER_SITE_OTHER): Payer: Medicare Other | Admitting: Vascular Surgery

## 2011-07-30 VITALS — BP 145/5 | HR 59 | Temp 97.8°F | Resp 16 | Ht 66.0 in | Wt 217.6 lb

## 2011-07-30 DIAGNOSIS — I739 Peripheral vascular disease, unspecified: Secondary | ICD-10-CM

## 2011-07-30 NOTE — Progress Notes (Signed)
[facility] [place]  Postoperative Visit  History of Present Illness  Kristina Summers is a 76 y.o. female who presents for postoperative follow-up for: L SFA OA+PTA  (Date: 06/17/11).  The patient's wounds are healed.  The patient notes resolution of lower extremity symptoms.  The patient is able to complete their activities of daily living.  The patient's current symptoms are: none.  PMH, PSH, FmH, SH, ROS, Med, Allergies are unchanged from 06/17/11  Physical Examination  Filed Vitals:   07/30/11 0935  BP: 145/5  Pulse: 59  Temp: 97.8 F (36.6 C)  TempSrc: Oral  Resp: 16  Height: 5\' 6"  (1.676 m)  Weight: 217 lb 9.6 oz (98.703 kg)  SpO2: 96%    General: A&O x 3, WDWN, obese  Pulmonary: Sym exp, good air movt, CTAB, no rales, rhonchi, & wheezing  Cardiac: RRR, Nl S1, S2, no Murmurs, rubs or gallops  Vascular: Vessel Right Left  Radial Palpable Palpable  [place] Palpable Palpable  Brachial Palpable Palpable  Carotid Palpable, without bruit Palpable, without bruit  Aorta Non-palpable N/A  Femoral Palpable Palpable  Popliteal Non-palpable Non-palpable  PT Palpable Palpable  DP Palpable Palpable   Gastrointestinal: soft, NTND, -G/R, - HSM, - masses, - CVAT B  Musculoskeletal: M/S 5/5 throughout , Extremities without ischemic changes , L shin is completely healed without ulcers at this point, R groin without PSA or bruit  Neurologic: CN 2-12 intact , Pain and light touch intact in extremities , Motor exam as listed above  Medical Decision Making  Kristina Summers is a 76 y.o. female who presents s/p L SFA OA+PTA., previous R CIA PTA+S Given the excellent response in this pt to endovasc. Interventions, would continue with surveillance and intervene as needed on a primary assisted patency fashion. BLE ABI and BLE arterial duplex and aortoiliac duplex in 3 months.  Follow up at that time. I discussed in depth with the patient the nature of atherosclerosis, and  emphasized the importance of maximal medical management including strict control of blood pressure, blood glucose, and lipid levels, obtaining regular exercise, and cessation of smoking.  The patient is aware that without maximal medical management the underlying atherosclerotic disease process will progress, limiting the benefit of any interventions.  Thank you for allowing [place] to participate in this patient's care.  Adele Barthel, MD [facility] [place] Office: [phone] Pager: [phone]

## 2011-09-03 ENCOUNTER — Other Ambulatory Visit: Payer: Self-pay | Admitting: Cardiology

## 2011-09-03 ENCOUNTER — Encounter: Payer: Self-pay | Admitting: Cardiology

## 2011-09-03 DIAGNOSIS — I482 Chronic atrial fibrillation, unspecified: Secondary | ICD-10-CM | POA: Insufficient documentation

## 2011-09-03 DIAGNOSIS — I2510 Atherosclerotic heart disease of native coronary artery without angina pectoris: Secondary | ICD-10-CM | POA: Insufficient documentation

## 2011-09-03 DIAGNOSIS — E1151 Type 2 diabetes mellitus with diabetic peripheral angiopathy without gangrene: Secondary | ICD-10-CM | POA: Insufficient documentation

## 2011-09-03 DIAGNOSIS — E669 Obesity, unspecified: Secondary | ICD-10-CM | POA: Insufficient documentation

## 2011-09-03 DIAGNOSIS — I119 Hypertensive heart disease without heart failure: Secondary | ICD-10-CM | POA: Insufficient documentation

## 2011-09-03 DIAGNOSIS — E785 Hyperlipidemia, unspecified: Secondary | ICD-10-CM | POA: Insufficient documentation

## 2011-09-03 NOTE — Progress Notes (Signed)
Kristina Summers D  Date of visit:  09/03/2011 DOB:  07-16-1935    Age:  76 yrs. Medical record number:  [id number]     Account number:  [id number] Primary Care Provider: [facility] ____________________________ CURRENT DIAGNOSES  1. CAD,Native  2. Arrhythmia-Atrial Fibrillation  3. Diabetes Mellitus-NIDD/circ dis/controlled  4. Hypertensive Heart Disease-Benign without CHF  5. Obesity(BMI30-40)  6. MI-S/P Inferior  7. Hyperlipidemia  8. Claudication-Intermittent-PVD  9. Surgery-Aortocoronary Bypass Grafting ____________________________ ALLERGIES  lisinopril, Cough ____________________________ MEDICATIONS  1. furosemide 40 mg Tablet, 1 12 qam   1/2 qpm  2. Lipitor 80 mg Tablet, 1 p.o. q.d.  3. Calcium 500 500 mg calcium (1,250 mg) Tablet, Chewable, 1 p.o. daily  4. Novolin N 100 unit/mL Suspension, 38qam  34qpm  5. biotin 300 mcg tablet, 1044mcg qd  6. metoprolol tartrate 25 mg tablet, BID  7. nitroglycerin 0.4 mg tablet, sublingual, PRN  8. omeprazole 40 mg capsule,delayed release(DR/EC), 1 p.o. daily  9. losartan 50 mg tablet, BID  10. Plavix 75 mg tablet, 1 p.o. daily  11. multivitamin tablet, 1 p.o. daily ____________________________ CHIEF COMPLAINTS  Followup of Arrhythmia-Atrial Fibrillation  Followup of CAD,Native ____________________________ HISTORY OF PRESENT ILLNESS  Patient seen for cardiac followup. She has been doing well from a cardiovascular viewpoint and had a left superficial femoral atherectomy done by Dr. Bridgett Larsson for significant calcific disease. This was done in late February. Since then she has had significant improvement in her claudication to the point were she can now walk the full distance up the [facility] and has good pulses in her leg. She is clinically feeling much better and denies angina. She has no PND, orthopnea or significant edema. She has had a very difficult time getting an appointment with the [facility] clinic. Her diabetes has been  in fair control. She has mild aortic valve disease and has atrial fibrillation but because of the GI bleed is no longer on warfarin.  ____________________________ PAST HISTORY  Past Medical Illnesses:  DM-insulin dependent, hypertension, hyperlipidemia, obesity, carpal tunnel syndrome, GI bleed 1/11;  Cardiovascular Illnesses:  CAD, S/P MI-inferior 1992, atrial fibrillation, mild AV disease, peripheral vascular disease;  Surgical Procedures:  CABG w LIMA LAD, SVG to dx, SVG to OM, SVG to PD-PL 11/1998 Dr. Cyndia Bent, Excision of neck mass 1996 Dr. Lucia Gaskins, bilateral trigger thumb release;  Cardiology Procedures-Invasive:  cardiac cath (left) 2000, PTCA RCA 1993;  Cardiology Procedures-Noninvasive:  regadenoson cardiolte September 2010, echocardiogram September 2010, event monitor September 2010, lexiscan cardiolite November 2012;  Cardiac Cath Results:  20 % stenosis distal Left main, 60% stenosis mid LAD, 70% stenosis proximal CFX, occluded RCA;  Peripheral Vascular Procedures:  stent right common iliac 03/31/2012 Dr. Bridgett Larsson, atherectomy left common femoral 06/17/11 Dr. Bridgett Larsson;  LVEF of 61% documented via nuclear study on 03/18/2011 ____________________________ CARDIO-PULMONARY TEST DATES EKG Date:  02/05/2011;   Cardiac Cath Date:  11/13/1998;  CABG: 11/20/1998;  Holter/Event Monitor Date: 12/24/2008;  Nuclear Study Date:  03/18/2011;  Echocardiography Date: 01/01/2009;  Chest Xray Date: 04/22/2009;   ____________________________ SOCIAL HISTORY Alcohol Use:  no alcohol use;  Smoking:  used to smoke but quit;  Diet:  regular diet without modifications;  Lifestyle:  divorced and native of [place];  Exercise:  no regular exercise;  Occupation:  disabled;  Residence:  lives alone;  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;   ____________________________ REVIEW OF SYSTEMS General:  obesity, malaise and fatigue  Eyes:  diabetic retinopathy, laser treatments, recent macular hole  Respiratory:  mild  dyspnea with exertion  Cardiovascular:  please review HPI  Abdominal:  denies dyspepsia, GI bleeding, constipation, or diarrhea  Genitourinary-Female:  hesitancy  Musculoskeletal:  arthritis of the right arm  Endocrine:  peripheral neuropathy ____________________________ PHYSICAL EXAMINATION VITAL SIGNS  Blood Pressure:  164/82 Sitting, Right arm, large cuff  , 160/78 Standing, Right arm and large cuff   Pulse:  76/min. Weight:  215.00 lbs. Height:  65"BMI: 35  Constitutional:  pleasant white female, in no acute distress, moderately obese Skin:  warm and dry to touch, no apparent skin lesions, or masses noted. Head:  normocephalic, normal hair pattern, no masses or tenderness ENT:  ears, nose and throat unremarkable, full mouth dentures present Neck:  supple, no masses, thyromegaly, JVD. Carotid pulses are full and equal bilaterally without bruits. Chest:  clear to auscultation and percussion, healed median sternotomy scar Cardiac:  regular rhythm, normal S1 and S2, No S3 or S4, no murmurs, gallops or rubs detected. Peripheral Pulses:  femoral pulses 2+, posterior tibial pulses palpable Extremities & Back:  well healed saphenous vein donor site RLE, 1+ ankle edema right leg, bilateral lower extremity venous varicosities Neurological:  decreased sensation in feet ____________________________ MOST RECENT LIPID PANEL 09/03/11  CHOL TOTL 171 mg/dl, LDL 109 calc, HDL 33 mg/dl, TRIGLYCER 145 mg/dl and CHOL/HDL 5.2 (Calc) ____________________________ IMPRESSIONS/PLAN  1. Coronary artery disease with previous bypass grafting 2. Peripheral vascular disease with previous right iliac stent and atherectomy of the left superficial femoral artery with improvement in claudication 3. Diabetes mellitus with circulatory disease 4. Hyperlipidemia under treatment 5. Hypertensive heart disease currently not well controlled  Recommendations:  Return visit in 6 months. Call if recurrent problems. Advised  to continue walking make continue efforts at weight control. ____________________________ TODAYS ORDERS  1. Return Visit: 6 months  2. 12 Lead EKG: 6 months                       ____________________________ Cardiology Physician:  Kerry Hough MD [facility]

## 2011-10-14 ENCOUNTER — Other Ambulatory Visit (HOSPITAL_COMMUNITY): Payer: Self-pay | Admitting: Family Medicine

## 2011-10-14 DIAGNOSIS — Z1231 Encounter for screening mammogram for malignant neoplasm of breast: Secondary | ICD-10-CM

## 2011-11-04 ENCOUNTER — Encounter: Payer: Self-pay | Admitting: Neurosurgery

## 2011-11-05 ENCOUNTER — Ambulatory Visit (INDEPENDENT_AMBULATORY_CARE_PROVIDER_SITE_OTHER): Payer: Medicare Other | Admitting: Vascular Surgery

## 2011-11-05 ENCOUNTER — Ambulatory Visit (INDEPENDENT_AMBULATORY_CARE_PROVIDER_SITE_OTHER): Payer: Medicare Other | Admitting: Neurosurgery

## 2011-11-05 ENCOUNTER — Other Ambulatory Visit: Payer: Self-pay

## 2011-11-05 ENCOUNTER — Encounter: Payer: Self-pay | Admitting: Neurosurgery

## 2011-11-05 VITALS — BP 144/71 | HR 60 | Resp 16 | Ht 66.0 in | Wt 212.6 lb

## 2011-11-05 DIAGNOSIS — I739 Peripheral vascular disease, unspecified: Secondary | ICD-10-CM

## 2011-11-05 DIAGNOSIS — Z48812 Encounter for surgical aftercare following surgery on the circulatory system: Secondary | ICD-10-CM

## 2011-11-05 NOTE — Progress Notes (Signed)
Ankle brachial index performed @ [facility] 11/05/2011

## 2011-11-05 NOTE — Progress Notes (Signed)
Bilateral lower extremity arterial duplex performed @ [facility] 11/05/2011

## 2011-11-05 NOTE — Procedures (Unsigned)
LOWER EXTREMITY ARTERIAL DUPLEX  INDICATION:  Peripheral vascular disease  HISTORY: Diabetes:  Yes Cardiac: Hypertension:  Yes Smoking:  Previous Previous Surgery:  Left superficial femoral artery atherectomy and percutaneous transluminal angioplasty on 06/17/2011  SINGLE LEVEL ARTERIAL EXAM                         RIGHT                LEFT Brachial: Anterior tibial: Posterior tibial: Peroneal: Ankle/Brachial Index:   0.82                 0.69 Toe/Brachial Index:     0.55                 0.17  PREVIOUS ABI DATED 04/30/2011 RIGHT:  0.99  LEFT:  Not obtained  LOWER EXTREMITY ARTERIAL DUPLEX EXAM  DUPLEX:  Heterogeneous plaque with elevated velocity suggestive of 30%- 49% stenosis involving multiple levels of the right superficial femoral artery. Heterogeneous plaque with elevated velocity suggesting 50%-75% stenosis involving the left mid superficial femoral artery and a greater than 75% stenosis involving the left distal superficial femoral artery.   IMPRESSION: 1. Native artery stenosis present as noted above. 2. Right ankle brachial index is in the mild claudication range. 3. Left ankle brachial index is in the mild to moderate claudication     range. 4. Right toe brachial index is in the claudicate range. 5. Left toe brachial index is in the critical ischemic range.      ___________________________________________ Conrad Keystone, MD  SH/MEDQ  D:  11/05/2011  T:  11/05/2011  Job:  [id number]

## 2011-11-05 NOTE — Progress Notes (Signed)
Aorto-iliac artery duplex perfomred @ [facility] 11/05/2011

## 2011-11-05 NOTE — Progress Notes (Signed)
[facility] [place] [facility] Office Note  CC: Three-month followup status post left SFA OA and PTA Referring Physician: Bridgett Larsson  History of Present Illness: 76 year old female patient status post the above procedure date 06/17/2011. The patient was seen by Dr. Bridgett Larsson in April and had great improvement. The patient comes in today for three-month followup and denies rest pain or true claudication. Patient has no open ulcerations. The patient states she did fall a few weeks ago in her left knee has been bothering her.  Past Medical History  Diagnosis Date  . CAD (coronary artery disease)   . Obesity   . Atrial fibrillation   . Hypertension   . Diabetes mellitus   . Hyperlipidemia   . Peripheral vascular disease   . GI bleed 1/11  . Carotid artery occlusion   . Normal nuclear stress test 03/18/2011  . Angina   . GERD (gastroesophageal reflux disease)   . Arthritis   . Fibromyalgia   . Myocardial infarction ~ 1991    ROS: [x]  Positive   [ ]  Denies    General: [ ]  Weight loss, [ ]  Fever, [ ]  chills Neurologic: [ ]  Dizziness, [ ]  Blackouts, [ ]  Seizure [ ]  Stroke, [ ]  "Mini stroke", [ ]  Slurred speech, [ ]  Temporary blindness; [ ]  weakness in arms or legs, [ ]  Hoarseness Cardiac: [ ]  Chest pain/pressure, [ ]  Shortness of breath at rest [ ]  Shortness of breath with exertion, [ ]  Atrial fibrillation or irregular heartbeat Vascular: x[ ]  Pain in legs with walking, [ ]  Pain in legs at rest, [ ]  Pain in legs at night,  [ ]  Non-healing ulcer, [ ]  Blood clot in vein/DVT,   Pulmonary: [ ]  Home oxygen, [ ]  Productive cough, [ ]  Coughing up blood, [ ]  Asthma,  [ ]  Wheezing Musculoskeletal:  [ ]  Arthritis, [ ]  Low back pain, [ ]  Joint pain Hematologic: [ ]  Easy Bruising, [ ]  Anemia; [ ]  Hepatitis Gastrointestinal: [ ]  Blood in stool, [ ]  Gastroesophageal Reflux/heartburn, [ ]  Trouble swallowing Urinary: [ ]  chronic Kidney disease, [ ]  on HD - [ ]  MWF or [ ]  TTHS, [ ]  Burning  with urination, [ ]  Difficulty urinating Skin: [ ]  Rashes, [ ]  Wounds Psychological: [ ]  Anxiety, [ ]  Depression   Social History History  Substance Use Topics  . Smoking status: Former Smoker -- 2.0 packs/day for 30 years    Types: Cigarettes  . Smokeless tobacco: Never Used   Comment: stopped smoking cigarettes 1991  . Alcohol Use: No    Family History History reviewed. No pertinent family history.  Allergies  Allergen Reactions  . Lisinopril Cough  . Sertraline Hcl     REACTION: nausea    Current Outpatient Prescriptions  Medication Sig Dispense Refill  . atorvastatin (LIPITOR) 80 MG tablet Take 80 mg by mouth daily.        . clopidogrel (PLAVIX) 75 MG tablet Take 1 tablet (75 mg total) by mouth daily.  30 tablet  11  . furosemide (LASIX) 40 MG tablet Take 20-60 mg by mouth 2 (two) times daily. Pt takes 1.5 tabs (60mg ) in the morning and 1/2 tablet (20mg ) in the evening      . insulin NPH (HUMULIN N,NOVOLIN N) 100 UNIT/ML injection Inject 34-38 Units into the skin 2 (two) times daily. 38 units in the morning and 34 units in the evening.       Marland Kitchen losartan (COZAAR) 50 MG tablet  Take 50 mg by mouth 2 (two) times daily.        . metoprolol tartrate (LOPRESSOR) 25 MG tablet Take 25 mg by mouth 2 (two) times daily.        . Multiple Vitamin (MULTIVITAMIN) tablet Take 1 tablet by mouth daily.      Marland Kitchen omeprazole (PRILOSEC) 40 MG capsule Take 40 mg by mouth daily.        . silver sulfADIAZINE (SILVADENE) 1 % cream Apply to affected area BID  50 g  1    Physical Examination  Filed Vitals:   11/05/11 1158  BP: 144/71  Pulse: 60  Resp: 16    Body mass index is 34.31 kg/(m^2).  General:  WDWN in NAD Gait: Normal HEENT: WNL Eyes: Pupils equal Pulmonary: normal non-labored breathing , without Rales, rhonchi,  wheezing Cardiac: RRR, without  Murmurs, rubs or gallops; No carotid bruits Abdomen: soft, NT, no masses Skin: no rashes, ulcers noted Vascular Exam/Pulses: 2+ radial  pulses bilaterally, femoral pulses are palpable bilaterally, right PT and DP heard with Doppler, noted left lower extremity pulses to Doppler  Extremities without ischemic changes, no Gangrene , no cellulitis; no open wounds;  Musculoskeletal: no muscle wasting or atrophy  Neurologic: A&O X 3; Appropriate Affect ; SENSATION: normal; MOTOR FUNCTION:  moving all extremities equally. Speech is fluent/normal  Non-Invasive Vascular Imaging: Lower extremity arterial duplex today shows some mild elevations on the right up to 223 however distally on the left she is at 589. Dr. Bridgett Larsson reviewed this and has decided to redo the orbital procedure. Aortoiliac duplex shows aorta to be at 93 proximally, 173 mid and 87 distally, CIA proximal right is 117, left 163, distal right 0.54, left 229  ASSESSMENT/PLAN: Asymptomatic patient with severe stenosis in the left popliteal artery and will undergo repeat  left SFA OA and PTA next week. Her followup here in the office will be pending her discharge. The patient's questions were encouraged and answered.  Beatris Ship ANP  Clinic M.D.: Bridgett Larsson

## 2011-11-08 ENCOUNTER — Ambulatory Visit (HOSPITAL_COMMUNITY)
Admission: RE | Admit: 2011-11-08 | Discharge: 2011-11-08 | Disposition: A | Discharge status: Home / Self Care | Payer: Medicare Other | Source: Ambulatory Visit | Attending: Family Medicine | Admitting: Family Medicine

## 2011-11-08 DIAGNOSIS — Z1231 Encounter for screening mammogram for malignant neoplasm of breast: Secondary | ICD-10-CM

## 2011-11-12 NOTE — Procedures (Unsigned)
AORTA-ILIAC DUPLEX EVALUATION  INDICATION:  Peripheral vascular disease.  HISTORY: Diabetes:  Yes Cardiac: Hypertension:  Yes Smoking:  Previous Previous Surgery:  Right common iliac artery stent 04/01/2011              SINGLE LEVEL ARTERIAL EXAM                             RIGHT                  LEFT Brachial: Anterior tibial: Posterior tibial: Peroneal: Ankle/brachial index:      0.82                   0.69 Previous ABI/date:         04/30/2011, 0.99       Not obtained Toe/brachial index:        0.55                   0.16  AORTA-ILIAC DUPLEX EXAM Aorta - Proximal     93 cm/s Aorta - Mid          173 cm/s Aorta - Distal       87 cm/s  RIGHT                                   LEFT 117 cm/s          CIA-PROXIMAL          163 cm/s 154 cm/s          CIA-DISTAL            229 cm/s 111 cm/s          HYPOGASTRIC           143 cm/s 190 cm/s          EIA-PROXIMAL          123 cm/s 190 cm/s          EIA-MID               115 cm/s 126 cm/s          EIA-DISTAL            114 cm/s  IMPRESSION: 1. Elevated velocities involving the mid abdominal aorta suggestive of     stenosis. 2. Mildly elevated velocities involving the right external iliac and     left distal common iliac arteries, which may suggest stenosis. 3. Right common iliac artery stent could not be visualized due to     small caliber and patient body habitus. 4. Remainder of the abdominal aorta and iliac arteries evaluated     appear patent and without hemodynamic changes evident. 5. Right ankle-brachial index is in the mild claudication range. 6. Left ankle-brachial index is in the mild to moderate claudication     range. 7. Toe-brachial index on the right is in the claudicant range. 8. Toe-brachial index on the left is in the critical ischemia range.      ___________________________________________ Conrad Jet, MD  SH/MEDQ  D:  11/05/2011  T:  11/05/2011  Job:  [id number]

## 2011-11-17 MED ORDER — SODIUM CHLORIDE 0.9 % IV SOLN
INTRAVENOUS | Status: DC
  Administered 2011-11-18: 100 mL/h via INTRAVENOUS

## 2011-11-18 ENCOUNTER — Encounter (HOSPITAL_COMMUNITY)
Admission: RE | Disposition: A | Discharge status: Home / Self Care | Payer: Self-pay | Source: Ambulatory Visit | Attending: Vascular Surgery

## 2011-11-18 ENCOUNTER — Telehealth: Payer: Self-pay | Admitting: Vascular Surgery

## 2011-11-18 ENCOUNTER — Inpatient Hospital Stay (HOSPITAL_COMMUNITY)
Admission: RE | Admit: 2011-11-18 | Discharge: 2011-11-19 | DRG: 254 | Disposition: A | Discharge status: Home / Self Care | Payer: Medicare Other | Source: Ambulatory Visit | Attending: Vascular Surgery | Admitting: Vascular Surgery

## 2011-11-18 DIAGNOSIS — E785 Hyperlipidemia, unspecified: Secondary | ICD-10-CM | POA: Diagnosis present

## 2011-11-18 DIAGNOSIS — Z794 Long term (current) use of insulin: Secondary | ICD-10-CM

## 2011-11-18 DIAGNOSIS — I739 Peripheral vascular disease, unspecified: Secondary | ICD-10-CM

## 2011-11-18 DIAGNOSIS — Z951 Presence of aortocoronary bypass graft: Secondary | ICD-10-CM

## 2011-11-18 DIAGNOSIS — IMO0001 Reserved for inherently not codable concepts without codable children: Secondary | ICD-10-CM | POA: Diagnosis present

## 2011-11-18 DIAGNOSIS — E669 Obesity, unspecified: Secondary | ICD-10-CM | POA: Diagnosis present

## 2011-11-18 DIAGNOSIS — I70219 Atherosclerosis of native arteries of extremities with intermittent claudication, unspecified extremity: Secondary | ICD-10-CM

## 2011-11-18 DIAGNOSIS — K219 Gastro-esophageal reflux disease without esophagitis: Secondary | ICD-10-CM | POA: Diagnosis present

## 2011-11-18 DIAGNOSIS — I2510 Atherosclerotic heart disease of native coronary artery without angina pectoris: Secondary | ICD-10-CM | POA: Diagnosis present

## 2011-11-18 DIAGNOSIS — I252 Old myocardial infarction: Secondary | ICD-10-CM

## 2011-11-18 DIAGNOSIS — E119 Type 2 diabetes mellitus without complications: Secondary | ICD-10-CM | POA: Diagnosis present

## 2011-11-18 DIAGNOSIS — Z87891 Personal history of nicotine dependence: Secondary | ICD-10-CM

## 2011-11-18 DIAGNOSIS — I10 Essential (primary) hypertension: Secondary | ICD-10-CM | POA: Diagnosis present

## 2011-11-18 HISTORY — PX: LOWER EXTREMITY ANGIOGRAM: SHX5508

## 2011-11-18 LAB — POCT I-STAT, CHEM 8
BUN: 16 mg/dL (ref 6–23)
Calcium, Ion: 1.25 mmol/L (ref 1.13–1.30)
Calcium, Ion: 1.29 mmol/L (ref 1.13–1.30)
Creatinine, Ser: 0.8 mg/dL (ref 0.50–1.10)
Creatinine, Ser: 0.6 mg/dL (ref 0.50–1.10)
Glucose, Bld: 238 mg/dL — ABNORMAL HIGH (ref 70–99)
Glucose, Bld: 211 mg/dL — ABNORMAL HIGH (ref 70–99)
Hemoglobin: 14.3 g/dL (ref 12.0–15.0)
TCO2: 24 mmol/L (ref 0–100)
TCO2: 24 mmol/L (ref 0–100)

## 2011-11-18 LAB — CBC
MCH: 32.4 pg (ref 26.0–34.0)
Platelets: 229 10*3/uL (ref 150–400)
RBC: 4.14 MIL/uL (ref 3.87–5.11)
RDW: 12.5 % (ref 11.5–15.5)
WBC: 9.6 10*3/uL (ref 4.0–10.5)

## 2011-11-18 LAB — CREATININE, SERUM: Creatinine, Ser: 0.66 mg/dL (ref 0.50–1.10)

## 2011-11-18 LAB — POCT ACTIVATED CLOTTING TIME
Activated Clotting Time: 190 seconds
Activated Clotting Time: 164 seconds

## 2011-11-18 LAB — GLUCOSE, CAPILLARY: Glucose-Capillary: 249 mg/dL — ABNORMAL HIGH (ref 70–99)

## 2011-11-18 SURGERY — ANGIOGRAM, LOWER EXTREMITY
Anesthesia: LOCAL

## 2011-11-18 MED ORDER — PANTOPRAZOLE SODIUM 40 MG PO TBEC: 40.0000 mg | DELAYED_RELEASE_TABLET | Freq: Every day | ORAL | Status: DC

## 2011-11-18 MED ORDER — HYDRALAZINE HCL 20 MG/ML IJ SOLN
INTRAMUSCULAR | Status: AC
  Filled 2011-11-18: qty 1

## 2011-11-18 MED ORDER — METOPROLOL TARTRATE 25 MG PO TABS
25.0000 mg | ORAL_TABLET | Freq: Two times a day (BID) | ORAL | Status: DC
  Administered 2011-11-18: 25 mg via ORAL
  Filled 2011-11-18 (×3): qty 1

## 2011-11-18 MED ORDER — ONDANSETRON HCL 4 MG/2ML IJ SOLN: 4.0000 mg | Freq: Four times a day (QID) | INTRAMUSCULAR | Status: DC | PRN

## 2011-11-18 MED ORDER — FENTANYL CITRATE 0.05 MG/ML IJ SOLN
INTRAMUSCULAR | Status: AC
  Filled 2011-11-18: qty 2

## 2011-11-18 MED ORDER — INSULIN ASPART 100 UNIT/ML ~~LOC~~ SOLN
0.0000 [IU] | Freq: Three times a day (TID) | SUBCUTANEOUS | Status: DC
  Administered 2011-11-18: 5 [IU] via SUBCUTANEOUS

## 2011-11-18 MED ORDER — HYDRALAZINE HCL 20 MG/ML IJ SOLN
10.0000 mg | Freq: Once | INTRAMUSCULAR | Status: AC
  Administered 2011-11-18: 10 mg via INTRAVENOUS

## 2011-11-18 MED ORDER — ONE-DAILY MULTI VITAMINS PO TABS: 1.0000 | ORAL_TABLET | Freq: Every day | ORAL | Status: DC

## 2011-11-18 MED ORDER — METOPROLOL TARTRATE 1 MG/ML IV SOLN: 2.0000 mg | INTRAVENOUS | Status: DC | PRN

## 2011-11-18 MED ORDER — HEPARIN SODIUM (PORCINE) 1000 UNIT/ML IJ SOLN
INTRAMUSCULAR | Status: AC
  Filled 2011-11-18: qty 1

## 2011-11-18 MED ORDER — FUROSEMIDE 20 MG PO TABS
20.0000 mg | ORAL_TABLET | Freq: Every day | ORAL | Status: DC
  Administered 2011-11-18: 20 mg via ORAL
  Filled 2011-11-18 (×2): qty 1

## 2011-11-18 MED ORDER — HYDRALAZINE HCL 20 MG/ML IJ SOLN
10.0000 mg | INTRAMUSCULAR | Status: DC | PRN
  Filled 2011-11-18: qty 0.5

## 2011-11-18 MED ORDER — SODIUM CHLORIDE 0.9 % IV SOLN: 1.0000 mL/kg/h | INTRAVENOUS | Status: DC

## 2011-11-18 MED ORDER — SODIUM CHLORIDE 0.9 % IV SOLN
INTRAVENOUS | Status: DC
  Administered 2011-11-18 – 2011-11-19 (×2): via INTRAVENOUS

## 2011-11-18 MED ORDER — TRAMADOL HCL 50 MG PO TABS: 50.0000 mg | ORAL_TABLET | Freq: Four times a day (QID) | ORAL | Status: DC | PRN

## 2011-11-18 MED ORDER — ATORVASTATIN CALCIUM 80 MG PO TABS
80.0000 mg | ORAL_TABLET | Freq: Every day | ORAL | Status: DC
  Administered 2011-11-18: 80 mg via ORAL
  Filled 2011-11-18 (×2): qty 1

## 2011-11-18 MED ORDER — FUROSEMIDE 40 MG PO TABS
60.0000 mg | ORAL_TABLET | Freq: Every day | ORAL | Status: DC
  Filled 2011-11-18: qty 1

## 2011-11-18 MED ORDER — INSULIN NPH (HUMAN) (ISOPHANE) 100 UNIT/ML ~~LOC~~ SUSP
34.0000 [IU] | Freq: Every day | SUBCUTANEOUS | Status: DC
  Administered 2011-11-18: 34 [IU] via SUBCUTANEOUS
  Filled 2011-11-18: qty 10

## 2011-11-18 MED ORDER — LOSARTAN POTASSIUM 50 MG PO TABS
50.0000 mg | ORAL_TABLET | Freq: Two times a day (BID) | ORAL | Status: DC
  Administered 2011-11-18: 50 mg via ORAL
  Filled 2011-11-18 (×3): qty 1

## 2011-11-18 MED ORDER — MIDAZOLAM HCL 2 MG/2ML IJ SOLN
INTRAMUSCULAR | Status: AC
  Filled 2011-11-18: qty 2

## 2011-11-18 MED ORDER — HEPARIN (PORCINE) IN NACL 2-0.9 UNIT/ML-% IJ SOLN
INTRAMUSCULAR | Status: AC
  Filled 2011-11-18: qty 1000

## 2011-11-18 MED ORDER — ACETAMINOPHEN 325 MG PO TABS: 650.0000 mg | ORAL_TABLET | ORAL | Status: DC | PRN

## 2011-11-18 MED ORDER — LABETALOL HCL 5 MG/ML IV SOLN
10.0000 mg | INTRAVENOUS | Status: DC | PRN
  Filled 2011-11-18: qty 4

## 2011-11-18 MED ORDER — ACETAMINOPHEN 325 MG PO TABS: 325.0000 mg | ORAL_TABLET | ORAL | Status: DC | PRN

## 2011-11-18 MED ORDER — FUROSEMIDE 20 MG PO TABS: 20.0000 mg | ORAL_TABLET | Freq: Two times a day (BID) | ORAL | Status: DC

## 2011-11-18 MED ORDER — DOCUSATE SODIUM 100 MG PO CAPS
100.0000 mg | ORAL_CAPSULE | Freq: Two times a day (BID) | ORAL | Status: DC
  Filled 2011-11-18 (×3): qty 1

## 2011-11-18 MED ORDER — INSULIN NPH (HUMAN) (ISOPHANE) 100 UNIT/ML ~~LOC~~ SUSP
38.0000 [IU] | Freq: Every day | SUBCUTANEOUS | Status: DC
  Administered 2011-11-19: 38 [IU] via SUBCUTANEOUS
  Filled 2011-11-18: qty 10

## 2011-11-18 MED ORDER — CLOPIDOGREL BISULFATE 75 MG PO TABS: 75.0000 mg | ORAL_TABLET | Freq: Every day | ORAL | Status: DC

## 2011-11-18 MED ORDER — VERAPAMIL HCL 2.5 MG/ML IV SOLN
INTRAVENOUS | Status: AC
  Filled 2011-11-18: qty 2

## 2011-11-18 MED ORDER — ACETAMINOPHEN 650 MG RE SUPP: 325.0000 mg | RECTAL | Status: DC | PRN

## 2011-11-18 MED ORDER — INSULIN NPH (HUMAN) (ISOPHANE) 100 UNIT/ML ~~LOC~~ SUSP
34.0000 [IU] | Freq: Two times a day (BID) | SUBCUTANEOUS | Status: DC
Start: 2011-11-18 — End: 2011-11-18

## 2011-11-18 MED ORDER — ENOXAPARIN SODIUM 40 MG/0.4ML ~~LOC~~ SOLN
40.0000 mg | SUBCUTANEOUS | Status: DC
  Administered 2011-11-18: 40 mg via SUBCUTANEOUS
  Filled 2011-11-18 (×2): qty 0.4

## 2011-11-18 MED ORDER — NITROGLYCERIN 0.2 MG/ML ON CALL CATH LAB
INTRAVENOUS | Status: AC
  Filled 2011-11-18: qty 1

## 2011-11-18 MED ORDER — CLOPIDOGREL BISULFATE 75 MG PO TABS
75.0000 mg | ORAL_TABLET | Freq: Every day | ORAL | Status: DC
  Filled 2011-11-18: qty 1

## 2011-11-18 MED ORDER — LIDOCAINE HCL (PF) 1 % IJ SOLN
INTRAMUSCULAR | Status: AC
  Filled 2011-11-18: qty 30

## 2011-11-18 MED ORDER — INSULIN ASPART 100 UNIT/ML ~~LOC~~ SOLN
0.0000 [IU] | Freq: Every day | SUBCUTANEOUS | Status: DC
  Administered 2011-11-18: 5 [IU] via SUBCUTANEOUS

## 2011-11-18 MED ORDER — ADULT MULTIVITAMIN W/MINERALS CH
1.0000 | ORAL_TABLET | Freq: Every day | ORAL | Status: DC
  Administered 2011-11-18: 1 via ORAL
  Filled 2011-11-18 (×2): qty 1

## 2011-11-18 NOTE — Telephone Encounter (Signed)
Patient notified via voicemail, dpm

## 2011-11-18 NOTE — Progress Notes (Signed)
[facility] [place]  Pt had bleeding episode while in short stay and episode of vomiting.  On exam, VSS.  Rt groin is soft without obvious hematoma or pseudoaneurysm.  Both feet are warm.   Objective Filed Vitals:   11/18/11 1500 11/18/11 1515 11/18/11 1525 11/18/11 1600  BP: 176/79 144/54 131/50 146/85  Pulse: 79 72 74 74  Temp:      TempSrc:      Resp: 18 18 18 18   Height:      Weight:      SpO2:       - Pt does not have help at home so I recommended she be admitted overnight as a precaution - In AM, she will likely be d/c as long as no significant change in CBC and stable vitals overnight  Adele Barthel, MD [facility] [place] Office: [phone] Pager: [phone]  11/18/2011, 4:36 PM

## 2011-11-18 NOTE — Progress Notes (Signed)
Bladder scanned pt and found <300 ml. So, per MD order, in and out catheterized pt. Obtained 619ml. Will notify on coming night shift of this and inform them of order if >300 to insert foley catheter.

## 2011-11-18 NOTE — Progress Notes (Addendum)
1305 right groin re bled after move to ssc bed.  Complained of nausea and vomited approximately 100 cc thick green emesis.  Skin warm and dry.  [employer] from cath lab present to hold pressure to groin x 35 minutes then redressed the site with tegaderm.  Dr Bridgett Larsson advised.  States he will see patient about 39.

## 2011-11-18 NOTE — Telephone Encounter (Signed)
Message copied by Gena Fray on Thu Nov 18, 2011  3:50 PM ------      Message from: Alfonso Patten      Created: Thu Nov 18, 2011  2:01 PM                   ----- Message -----         From: Conrad Fulton, MD         Sent: 11/18/2011  10:56 AM           To: Patrici Ranks, Alfonso Patten, RN            Rukia Gunderson      GS:[id number]      28-May-1935            PROCEDURE:       1. Right common femoral artery cannulation under ultrasound guidance       2. Left leg runoff       3. Orbital atherectomy of left superficial femoral artery       4. Angioplasty of left superficial femoral artery             Follow-up: 4 weeks

## 2011-11-18 NOTE — H&P (Signed)
[facility] [place]  Brief History and Physical  History of Present Illness  Kristina Summers is a 76 y.o. female who presents with chief complaint: left leg claudication.  The patient presents today for LLE angiogram, possible intervention.    Past Medical History  Diagnosis Date  . CAD (coronary artery disease)   . Obesity   . Atrial fibrillation   . Hypertension   . Diabetes mellitus   . Hyperlipidemia   . Peripheral vascular disease   . GI bleed 1/11  . Carotid artery occlusion   . Normal nuclear stress test 03/18/2011  . Angina   . GERD (gastroesophageal reflux disease)   . Arthritis   . Fibromyalgia   . Myocardial infarction ~ 1991    Past Surgical History  Procedure Date  . Trigger finger release 1996    right thumb  . Cataract extraction w/ intraocular lens  implant, bilateral 2004-2005  . Coronary artery bypass graft 2000    CABG X5  . Peripheral arterial stent graft 04/01/11    right common iliac  . Angioplasty 06/17/11    Left leg common femoral artery cannulation under u/s Left leg runoff    History   Social History  . Marital Status: Divorced    Spouse Name: N/A    Number of Children: N/A  . Years of Education: N/A   Occupational History  . Not on file.   Social History Main Topics  . Smoking status: Former Smoker -- 2.0 packs/day for 30 years    Types: Cigarettes  . Smokeless tobacco: Never Used   Comment: stopped smoking cigarettes 1991  . Alcohol Use: No  . Drug Use: No  . Sexually Active: No   Other Topics Concern  . Not on file   Social History Narrative   Divorced.  Native of [place].  Formerly worked as Education administrator person.    No family history on file.  No current facility-administered medications on file prior to encounter.   Current Outpatient Prescriptions on File Prior to Encounter  Medication Sig Dispense Refill  . atorvastatin (LIPITOR) 80 MG tablet Take 80 mg by mouth daily.        . Calcium  Carbonate-Vitamin D (CALCIUM PLUS VITAMIN D PO) Take 1 tablet by mouth daily.      . clopidogrel (PLAVIX) 75 MG tablet Take 1 tablet (75 mg total) by mouth daily.  30 tablet  11  . furosemide (LASIX) 40 MG tablet Take 20-60 mg by mouth 2 (two) times daily. Pt takes 1.5 tabs (60mg ) in the morning and 1/2 tablet (20mg ) in the evening      . insulin NPH (HUMULIN N,NOVOLIN N) 100 UNIT/ML injection Inject 34-38 Units into the skin 2 (two) times daily. 38 in am and 34 at night      . losartan (COZAAR) 50 MG tablet Take 50 mg by mouth 2 (two) times daily.        . metoprolol tartrate (LOPRESSOR) 25 MG tablet Take 25 mg by mouth 2 (two) times daily.        . Multiple Vitamin (MULTIVITAMIN) tablet Take 1 tablet by mouth daily.      Marland Kitchen omeprazole (PRILOSEC) 40 MG capsule Take 40 mg by mouth daily.          Allergies  Allergen Reactions  . Morphine And Related Nausea And Vomiting  . Lisinopril Cough  . Sertraline Hcl     REACTION: nausea    Review of Systems: As listed  above, otherwise negative.  Physical Examination  Filed Vitals:   11/18/11 0653 11/18/11 0658  BP: 159/80 134/75  Pulse: 64 62  Temp: 97 F (36.1 C) 97.7 F (36.5 C)  TempSrc: Oral Oral  Resp: 16 18  Height: 5\' 6"  (1.676 m) 5\' 6"  (1.676 m)  Weight: 212 lb (96.163 kg) 220 lb (99.791 kg)  SpO2: 96%     General: A&O x 3, WDWN  Pulmonary: Sym exp, good air movt, CTAB, no rales, rhonchi, & wheezing  Cardiac: RRR, Nl S1, S2, no Murmurs, rubs or gallops  Gastrointestinal: soft, NTND, -G/R, - HSM, - masses, - CVAT B  Musculoskeletal: M/S 5/5 throughout , Extremities without ischemic changes   Laboratory See Kristina Summers is a 76 y.o. female who presents with: left leg intermittent claudication.   The patient is scheduled for: LLE angiogram, possible intervention I discussed with the patient the nature of angiographic procedures, especially the limited patencies of any endovascular  intervention.  The patient is aware of that the risks of an angiographic procedure include but are not limited to: bleeding, infection, access site complications, renal failure, embolization, rupture of vessel, dissection, possible need for emergent surgical intervention, possible need for surgical procedures to treat the patient's pathology, and stroke and death.    The patient is aware of the risks and agrees to proceed.  Adele Barthel, MD [facility] [place] Office: [phone] Pager: [phone]  11/18/2011, 7:37 AM

## 2011-11-18 NOTE — Op Note (Addendum)
OPERATIVE NOTE  PROCEDURE:  1. Right common femoral artery cannulation under ultrasound guidance  2. Left leg runoff  3. Orbital atherectomy of left superficial femoral artery  4. Angioplasty of left superficial femoral artery   PRE-OPERATIVE DIAGNOSIS: High grade L superficial femoral artery stenosis  POST-OPERATIVE DIAGNOSIS: same as above   SURGEON: Adele Barthel, MD   ANESTHESIA: conscious sedation   ESTIMATED BLOOD LOSS: 50 cc   CONTRAST: 75 cc   FINDING(S):  Patent left common femoral artery, profunda femoral artery, and superficial femoral artery (heavily diseased)  Widely patent left popliteal artery with calcific above-the-knee segment  Widely patent left trifurcation with three vessel runoff to foot Superficial femoral artery: multiple calcific stenoses including >90% stenosis at Hunter's canal: near resolved after orbital atherectomy and angioplasty   SPECIMEN(S): none   INDICATIONS:  Kristina Summers is a 76 y.o. female who presents with bilateral peripheral arterial disease.  Her previous ulcers in her left leg have healed after her orbital atherectomy and angioplasty of the left superficial femoral artery in February 2013.  On follow up duplex surveillance she had a very high grade stenosis in the left superficial femoral artery, so I felt that an attempt at intervention on the left leg was critical to maintaining follow in that leg.  I discussed with the patient the nature of angiographic procedures, especially the limited patencies of any endovascular intervention. The patient is aware of that the risks of an angiographic procedure include but are not limited to: bleeding, infection, access site complications, renal failure, embolization, rupture of vessel, dissection, possible need for emergent surgical intervention, possible need for surgical procedures to treat the patient's pathology, and stroke and death. The patient is aware of the risks and agrees to proceed.    DESCRIPTION:  After full informed consent was obtained from the patient, the patient was brought back to the angiography suite. The patient was placed supine upon the angiography table and connected to monitoring equipment. The patient was then given conscious sedation, the amounts of which are documented in the patient's chart. The patient was prepped and drape in the standard fashion for an angiographic procedure. At this point, attention was turned to the right groin. Under ultrasound guidance, the right common femoral artery will be cannulated with a micropuncture needle. The microwire was placed through the needle into the right external iliac artery.  The needle was exchanged for the microsheath, and the [facility] wire was passed up into the aorta through the sheath.  The microsheath was exchanged for a 5-Fr sheath.  Using the [place] and [place] catheter, the left common iliac arterywas selected.  The wire would not pass the calcific disease in the left common iliac artery, so it was exchanged for a Glidewire which was able to get into the superficial femoral artery.  The Omniflush would not track into the left common iliac artery so it was exchanged for a an end hole catheter which placed into the left external iliac artery.  The catheter was connected to the power injector and an automated left leg runoff was completed.  Based on these images, intervention on the left superficial femoral artery was deemed necessary.  I placed an Amplatz wire through the end hole catheter into the left superficial femoral artery.  The patient's right femoral sheath was exchanged for a 6-Fr Destination sheath, which was lodged in the left common femoral artery. The dilator was removed. The patient was given 8000 units of Heparin intravenously, which was a therapeutic bolus.  In total, 10000 units of Heparin was administrated to achieve and maintain a therapeutic level of anticoagulation.  Using the endhole catheter and  glidewire, I was able to navigate into the superficial femoral artery.  The wire was exchanged for a 0.014" Spartacore which was able to traverse all of the stenoses in the superficial femoral artery. Using a Quickcross catheter, I exchanged the wire out for a 0.018" Viper wire. I selected a Classic 2 mm burr for the [facility] orbital atherectomy device. I used the [employer] device at three passes of 60K RPM, 90K RPM and 120K RPM with the device, giving 100 mg nitroglycerin between passes. I then selected a 4 mm x 100 mm angioplasty balloon. The [facility] device was exchanged for the angioplasty balloon which was inflated to profile for 2 minutes. The visualized waist resolved with angioplasty. The completion imaging demonstrated near resolution of the serial stenoses in the distal superficial femoral artery. I then exchanged the balloon for a 5 mm x100 mm angioplasty balloon, and inflated it to profile for 2 minutes. The balloon was deflated and removed.  Completion hand injections demonstrated near resolution of the previous distal superficial femoral artery stenosis and near resolution of the serial stenoses proximal to the high grade stenoses.  At this point, I did a trifurcation injection to determine if any embolization had occurred.  No embolic disease was visualized.  The wire was removed.  At this point, I pulled the sheath back into the right external iliac artery. The sheath was aspirated. No clots were present and the sheath was reloaded with heparinized saline. The plan is to pull the sheath after anticoagulation has reversed.   COMPLICATIONS: none   CONDITION: stable   Adele Barthel, MD [facility] [place] Office: [phone] Pager: [phone]  11/18/2011, 10:41 AM

## 2011-11-19 ENCOUNTER — Encounter (HOSPITAL_COMMUNITY): Payer: Self-pay | Admitting: Emergency Medicine

## 2011-11-19 LAB — CBC
HCT: 35.9 % — ABNORMAL LOW (ref 36.0–46.0)
MCH: 32.5 pg (ref 26.0–34.0)
MCHC: 34.5 g/dL (ref 30.0–36.0)
RDW: 12.7 % (ref 11.5–15.5)

## 2011-11-19 LAB — BASIC METABOLIC PANEL
BUN: 15 mg/dL (ref 6–23)
Creatinine, Ser: 0.63 mg/dL (ref 0.50–1.10)
GFR calc Af Amer: >90 mL/min (ref >90)
GFR calc non Af Amer: 86 mL/min — ABNORMAL LOW (ref >90)

## 2011-11-19 LAB — GLUCOSE, CAPILLARY: Glucose-Capillary: 96 mg/dL (ref 70–99)

## 2011-11-19 NOTE — Progress Notes (Signed)
Removed pt's IV, site clean and intact. No bleeding noted. Assessed right groin and still level 0. No bruising noted. Gauze dry and intact. Discharge instructions given to pt and discussed signs of infection, no lifting x2 weeks and no driving this weekend. Pt verbalized understanding. Ready for discharge when daughter arrives.

## 2011-11-19 NOTE — Discharge Summary (Addendum)
Physician Discharge Summary  Patient ID: Kristina Summers MRN: [id number] DOB/AGE: [birth year] 76 y.o.  Admit date: 11/18/2011 Discharge date: 11/19/2011  Admission Diagnosis: S/p R femoral artery cannulation complicated with bleeding  Discharge Diagnoses:  S/p R femoral artery cannulation complicated with bleeding  Secondary Diagnoses: Past Medical History  Diagnosis Date  . CAD (coronary artery disease)   . Obesity   . Atrial fibrillation   . Hypertension   . Diabetes mellitus   . Hyperlipidemia   . Peripheral vascular disease   . GI bleed 1/11  . Carotid artery occlusion   . Normal nuclear stress test 03/18/2011  . Angina   . GERD (gastroesophageal reflux disease)   . Arthritis   . Fibromyalgia   . Myocardial infarction ~ 1991    Procedures: L SFA orbital atherectomy with angioplasty (11/18/11)  Discharged Condition: stable  Hospital Course:  Jenniyah Jasperson is a 76 y.o. female who underwent L SFA orbital atherectomy with angioplasty (11/18/11).  She had a bleeding episode from her femoral area cannulation in the short stay area, so I felt admission overnight was indicated to insure no further bleeding occurred.  She stable throughout her stay and has had no further bleeding overnight.  Significant Diagnostic Studies: CBC    Component Value Date/Time   WBC 8.4 11/19/2011 0515   RBC 3.82* 11/19/2011 0515   HGB 12.4 11/19/2011 0515   HCT 35.9* 11/19/2011 0515   PLT 221 11/19/2011 0515   MCV 94.0 11/19/2011 0515   MCH 32.5 11/19/2011 0515   MCHC 34.5 11/19/2011 0515   RDW 12.7 11/19/2011 0515   LYMPHSABS 1.4 05/20/2009 2055   MONOABS 0.6 05/20/2009 2055   EOSABS 0.4 05/20/2009 2055   BASOSABS 0.1 05/20/2009 2055    BMET    Component Value Date/Time   NA 138 11/19/2011 0515   K 3.5 11/19/2011 0515   CL 104 11/19/2011 0515   CO2 26 11/19/2011 0515   GLUCOSE 99 11/19/2011 0515   BUN 15 11/19/2011 0515   CREATININE 0.63 11/19/2011 0515   CALCIUM 9.2 11/19/2011 0515   GFRNONAA 86* 11/19/2011 0515   GFRAA >90  11/19/2011 0515    COAG Lab Results  Component Value Date   INR 1.23 04/26/2009   INR 1.40 04/25/2009   INR 1.48 04/23/2009   No results found for this basename: PTT    Disposition: 01-Home or Self Care  Discharge Orders    Future Appointments: Provider: Department: Dept Phone: Center:   12/17/2011 9:30 AM Conrad Greencastle, MD [phone] [facility]     Medication List  As of 11/19/2011  7:48 AM   TAKE these medications         atorvastatin 80 MG tablet   Commonly known as: LIPITOR   Take 80 mg by mouth daily.      CALCIUM PLUS VITAMIN D PO   Take 1 tablet by mouth daily.      clopidogrel 75 MG tablet   Commonly known as: PLAVIX   Take 1 tablet (75 mg total) by mouth daily.      furosemide 40 MG tablet   Commonly known as: LASIX   Take 20-60 mg by mouth 2 (two) times daily. Pt takes 1.5 tabs (60mg ) in the morning and 1/2 tablet (20mg ) in the evening      insulin NPH 100 UNIT/ML injection   Commonly known as: HUMULIN N,NOVOLIN N   Inject 34-38 Units into the skin 2 (two) times daily. 38 in am and 34 at  night      losartan 50 MG tablet   Commonly known as: COZAAR   Take 50 mg by mouth 2 (two) times daily.      metoprolol tartrate 25 MG tablet   Commonly known as: LOPRESSOR   Take 25 mg by mouth 2 (two) times daily.      multivitamin tablet   Take 1 tablet by mouth daily.      omeprazole 40 MG capsule   Commonly known as: PRILOSEC   Take 40 mg by mouth daily.           Follow-up Information    Follow up with Hinda Lenis, MD in 4 weeks.   Contact information:   [address] [place] [phone]          Signed: Hinda Lenis 11/19/2011, 7:43 AM

## 2011-12-16 ENCOUNTER — Encounter: Payer: Self-pay | Admitting: Vascular Surgery

## 2011-12-17 ENCOUNTER — Encounter: Payer: Self-pay | Admitting: Vascular Surgery

## 2011-12-17 ENCOUNTER — Ambulatory Visit (INDEPENDENT_AMBULATORY_CARE_PROVIDER_SITE_OTHER): Payer: Medicare Other | Admitting: Vascular Surgery

## 2011-12-17 VITALS — BP 154/47 | HR 58 | Resp 18 | Ht 66.0 in | Wt 214.7 lb

## 2011-12-17 DIAGNOSIS — I70219 Atherosclerosis of native arteries of extremities with intermittent claudication, unspecified extremity: Secondary | ICD-10-CM

## 2011-12-17 DIAGNOSIS — Z48812 Encounter for surgical aftercare following surgery on the circulatory system: Secondary | ICD-10-CM

## 2011-12-17 DIAGNOSIS — I739 Peripheral vascular disease, unspecified: Secondary | ICD-10-CM

## 2011-12-17 NOTE — Progress Notes (Signed)
[facility] [place]  Postoperative Visit  History of Present Illness  Kristina Summers is a 76 y.o. female who presents for postoperative follow-up from procedure on Date: 11/18/11: OA+PTA L SFA .  The patient never had wounds.  The patient notes resolution of lower extremity symptoms.  The patient is  able to complete their activities of daily living.  The patient's current symptoms are: none.  The patient has been able to resume swimming sx free.  Past Medical History, Past Surgical History, Social History, Family History, Medications, Allergies, and Review of Systems are unchanged from previous evaluation on 11/18/11.  Physical Examination  Filed Vitals:   12/17/11 1006  BP: 154/47  Pulse: 58  Resp: 18  Height: 5\' 6"  (1.676 m)  Weight: 214 lb 11.2 oz (97.387 kg)  SpO2: 95%   Body mass index is 34.65 kg/(m^2).  General: A&O x 3, WDWN, obese  Pulmonary: Sym exp, good air movt, CTAB, no rales, rhonchi, & wheezing  Cardiac: RRR, Nl S1, S2, no Murmurs, rubs or gallops  Vascular: Vessel Right Left  Radial Palpable Palpable  [place] Palpable Palpable  Brachial Palpable Palpable  Carotid Palpable, without bruit Palpable, without bruit  Aorta Non-palpable N/A  Femoral Palpable Palpable  Popliteal Non-palpable Non-palpable  PT Palpable Palpable  DP Palpable Palpable   Gastrointestinal: soft, NTND, -G/R, - HSM, - masses, - CVAT B  Musculoskeletal: M/S 5/5 throughout , Extremities without ischemic changes , right groin without hematoma, no echymosis present at cannulation site  Neurologic:  Pain and light touch intact in extremities , Motor exam as listed above  Medical Decision Making  Kristina Summers is a 76 y.o. female who presents s/p successful OA+PTA L SFA with resolution of symptoms.  This patient has been extremely responsive to orbital atherectomy and angioplasty management of her L SFA disease, so I would intervention as needed. Based on his angiographic  findings, this patient needs: ABI and LLE arterial duplex in 3 months. I discussed in depth with the patient the nature of atherosclerosis, and emphasized the importance of maximal medical management including strict control of blood pressure, blood glucose, and lipid levels, obtaining regular exercise, and cessation of smoking.  The patient is aware that without maximal medical management the underlying atherosclerotic disease process will progress, limiting the benefit of any interventions.  Thank you for allowing [place] to participate in this patient's care.  Adele Barthel, MD [facility] [place] Office: [phone] Pager: [phone]

## 2012-03-23 ENCOUNTER — Encounter: Payer: Self-pay | Admitting: Vascular Surgery

## 2012-03-24 ENCOUNTER — Encounter: Payer: Self-pay | Admitting: Vascular Surgery

## 2012-03-24 ENCOUNTER — Ambulatory Visit (INDEPENDENT_AMBULATORY_CARE_PROVIDER_SITE_OTHER): Payer: Medicare Other | Admitting: Vascular Surgery

## 2012-03-24 ENCOUNTER — Encounter (INDEPENDENT_AMBULATORY_CARE_PROVIDER_SITE_OTHER): Payer: Medicare Other | Admitting: *Deleted

## 2012-03-24 VITALS — BP 107/34 | HR 73 | Ht 66.0 in | Wt 213.0 lb

## 2012-03-24 DIAGNOSIS — Z48812 Encounter for surgical aftercare following surgery on the circulatory system: Secondary | ICD-10-CM

## 2012-03-24 DIAGNOSIS — I739 Peripheral vascular disease, unspecified: Secondary | ICD-10-CM

## 2012-03-24 NOTE — Addendum Note (Signed)
Addended by: Mena Goes on: 03/24/2012 03:10 PM   Modules accepted: Orders

## 2012-03-24 NOTE — Progress Notes (Signed)
[facility] [place]  Established Intermittent Claudication  History of Present Illness  Kristina Summers is a 76 y.o. (15-Jul-1935) female who presents with chief complaint: back pain.  Patient is now walking up 1 mile without any claudication symptoms.  The patient's symptoms are: back pain The patient's treatment regimen currently included: maximal medical management and walking plan.  Past Medical History, Past Surgical History, Social History, Family History, Medications, Allergies, and Review of Systems are unchanged from previous evaluation on 12/17/11.  Physical Examination  Filed Vitals:   03/24/12 1006  BP: 107/34  Pulse: 73  Height: 5\' 6"  (1.676 m)  Weight: 213 lb (96.616 kg)  SpO2: 97%   Body mass index is 34.38 kg/(m^2).  General: A&O x 3, WDWN, obese   Pulmonary: Sym exp, good air movt, CTAB, no rales, rhonchi, & wheezing  Cardiac: RRR, Nl S1, S2, no Murmurs, rubs or gallops  Vascular: Vessel Right Left  Radial Palpable Palpable  Ulnar Palpable Palpable  Brachial Palpable Palpable  Carotid Palpable, without bruit Palpable, without bruit  Aorta Not palpable N/A  Femoral Palpable Palpable  Popliteal Not palpable Not palpable  PT Palpable Palpable  DP Palpable Palpable   Musculoskeletal: M/S 5/5 throughout , Extremities without ischemic changes , BLE chronic venous skin changes  Neurologic: Pain and light touch intact in extremities , Motor exam as listed above  Non-Invasive Vascular Imaging ABI (Date: 03/24/12)  RLE: 0.90, PT: triphasic, DP: biphasic  LLE: 0.96, PT: biphasic, DP: biphasic  LLE Arterial Duplex (Date: 03/24/12)  CFA: 153 c/s  SFA: 85-214 c/s  Pop: 104 c/s  PT: 79 c/s  Peroneal: 38 c/s  Medical Decision Making  Kristina Summers is a 76 y.o. female who presents s/p previous RLE stenting, L SFA OA+PTA  Based on the patient's vascular studies and examination, I have offered the patient: continued surveillance q3 months with  BLE ABI and BLE Arterial duplex.  I discussed in depth with the patient the nature of atherosclerosis, and emphasized the importance of maximal medical management including strict control of blood pressure, blood glucose, and lipid levels, antiplatelet agents, obtaining regular exercise, and cessation of smoking.  The patient is aware that without maximal medical management the underlying atherosclerotic disease process will progress, limiting the benefit of any interventions.  Thank you for allowing [place] to participate in this patient's care.  Adele Barthel, MD [facility] [place] Office: [phone] Pager: [phone]  03/24/2012, 10:38 AM

## 2012-06-23 ENCOUNTER — Ambulatory Visit: Payer: Medicare Other | Admitting: Neurosurgery

## 2012-06-29 ENCOUNTER — Encounter: Payer: Self-pay | Admitting: Neurosurgery

## 2012-06-30 ENCOUNTER — Ambulatory Visit: Payer: Medicare Other | Admitting: Neurosurgery

## 2012-06-30 ENCOUNTER — Encounter (INDEPENDENT_AMBULATORY_CARE_PROVIDER_SITE_OTHER): Payer: Medicare Other | Admitting: *Deleted

## 2012-06-30 DIAGNOSIS — Z48812 Encounter for surgical aftercare following surgery on the circulatory system: Secondary | ICD-10-CM

## 2012-06-30 DIAGNOSIS — I739 Peripheral vascular disease, unspecified: Secondary | ICD-10-CM

## 2012-07-10 ENCOUNTER — Other Ambulatory Visit: Payer: Self-pay

## 2012-07-10 DIAGNOSIS — Z48812 Encounter for surgical aftercare following surgery on the circulatory system: Secondary | ICD-10-CM

## 2012-07-10 DIAGNOSIS — I739 Peripheral vascular disease, unspecified: Secondary | ICD-10-CM

## 2012-07-12 ENCOUNTER — Encounter: Payer: Self-pay | Admitting: Vascular Surgery

## 2012-11-27 ENCOUNTER — Encounter (HOSPITAL_COMMUNITY): Payer: Self-pay

## 2012-11-27 ENCOUNTER — Emergency Department (INDEPENDENT_AMBULATORY_CARE_PROVIDER_SITE_OTHER)
Admission: EM | Admit: 2012-11-27 | Discharge: 2012-11-27 | Disposition: A | Discharge status: Home / Self Care | Payer: Medicare Other | Source: Home / Self Care

## 2012-11-27 DIAGNOSIS — I739 Peripheral vascular disease, unspecified: Secondary | ICD-10-CM

## 2012-11-27 DIAGNOSIS — I2510 Atherosclerotic heart disease of native coronary artery without angina pectoris: Secondary | ICD-10-CM

## 2012-11-27 DIAGNOSIS — Z760 Encounter for issue of repeat prescription: Secondary | ICD-10-CM

## 2012-11-27 DIAGNOSIS — E785 Hyperlipidemia, unspecified: Secondary | ICD-10-CM

## 2012-11-27 DIAGNOSIS — E1151 Type 2 diabetes mellitus with diabetic peripheral angiopathy without gangrene: Secondary | ICD-10-CM

## 2012-11-27 DIAGNOSIS — E1159 Type 2 diabetes mellitus with other circulatory complications: Secondary | ICD-10-CM

## 2012-11-27 LAB — GLUCOSE, CAPILLARY: Glucose-Capillary: 212 mg/dL — ABNORMAL HIGH (ref 70–99)

## 2012-11-27 MED ORDER — INSULIN NPH (HUMAN) (ISOPHANE) 100 UNIT/ML ~~LOC~~ SUSP: 34.0000 [IU] | Freq: Two times a day (BID) | SUBCUTANEOUS | Status: DC

## 2012-11-27 MED ORDER — ATORVASTATIN CALCIUM 80 MG PO TABS: 80.0000 mg | ORAL_TABLET | Freq: Every day | ORAL | Status: DC

## 2012-11-27 NOTE — ED Provider Notes (Signed)
CSN: [id number]     Arrival date & time 11/27/12  1233 History     First MD Initiated Contact with Patient 11/27/12 1358     Chief Complaint  Patient presents with  . Medication Refill   (Consider location/radiation/quality/duration/timing/severity/associated sxs/prior Treatment) HPI Comments: 77 year old female presents requesting refills for her insulin. She has a primary care appointment set up for August 14, 3 days from now, and she thought her insulin would last her. However she ran out this morning. She says her blood sugar has been running from the high 100s to low 300s. She denies any chest pain, shortness of breath, or any pain or numbness anywhere. No nausea or vomiting. She takes Humulin N. twice daily.   Past Medical History  Diagnosis Date  . CAD (coronary artery disease)   . Obesity   . Atrial fibrillation   . Hypertension   . Diabetes mellitus   . Hyperlipidemia   . Peripheral vascular disease   . GI bleed 1/11  . Carotid artery occlusion   . Normal nuclear stress test 03/18/2011  . Angina   . GERD (gastroesophageal reflux disease)   . Arthritis   . Fibromyalgia   . Myocardial infarction ~ 1991   Past Surgical History  Procedure Laterality Date  . Trigger finger release  1996    right thumb  . Cataract extraction w/ intraocular lens  implant, bilateral  2004-2005  . Coronary artery bypass graft  2000    CABG X5  . Peripheral arterial stent graft  04/01/11    right common iliac  . Angioplasty  06/17/11    Left leg common femoral artery cannulation under u/s Left leg runoff   Family History  Problem Relation Age of Onset  . Other Brother     intestinal blockage   History  Substance Use Topics  . Smoking status: Former Smoker -- 2.00 packs/day for 30 years    Types: Cigarettes  . Smokeless tobacco: Never Used     Comment: stopped smoking cigarettes 1991  . Alcohol Use: No   OB History   Grav Para Term Preterm Abortions TAB SAB Ect Mult Living              Review of Systems  Constitutional: Negative for fever and chills.  Eyes: Negative for visual disturbance.  Respiratory: Negative for cough and shortness of breath.   Cardiovascular: Negative for chest pain, palpitations and leg swelling.  Gastrointestinal: Negative for nausea, vomiting and abdominal pain.  Endocrine: Negative for polydipsia and polyuria.  Genitourinary: Negative for dysuria, urgency and frequency.  Musculoskeletal: Negative for myalgias and arthralgias.  Skin: Negative for rash.  Neurological: Negative for dizziness, weakness and light-headedness.    Allergies  Morphine and related; Lisinopril; Sertraline hcl; and Zoloft  Home Medications   Current Outpatient Rx  Name  Route  Sig  Dispense  Refill  . Calcium Carbonate-Vitamin D (CALCIUM PLUS VITAMIN D PO)   Oral   Take 1 tablet by mouth daily.         . cholecalciferol (VITAMIN D) 1000 UNITS tablet   Oral   Take 1,000 Units by mouth 2 (two) times daily.         Marland Kitchen losartan (COZAAR) 50 MG tablet   Oral   Take 50 mg by mouth 2 (two) times daily.           . Multiple Vitamin (MULTIVITAMIN) tablet   Oral   Take 1 tablet by mouth daily.         Marland Kitchen  amLODipine (NORVASC) 5 MG tablet   Oral   Take 1 tablet by mouth daily.         Marland Kitchen atorvastatin (LIPITOR) 80 MG tablet   Oral   Take 1 tablet (80 mg total) by mouth daily.   30 tablet   0   . furosemide (LASIX) 40 MG tablet   Oral   Take 20-60 mg by mouth 2 (two) times daily. Pt takes 1.5 tabs (60mg ) in the morning and 1/2 tablet (20mg ) in the evening         . insulin NPH (HUMULIN N,NOVOLIN N) 100 UNIT/ML injection   Subcutaneous   Inject 34-38 Units into the skin 2 (two) times daily. 38 in am and 34 at night   3 vial   1   . metFORMIN (GLUCOPHAGE) 500 MG tablet               . metoprolol tartrate (LOPRESSOR) 25 MG tablet   Oral   Take 25 mg by mouth 2 (two) times daily.           Marland Kitchen NOVOLOG MIX 70/30 FLEXPEN (70-30) 100  UNIT/ML injection               . omeprazole (PRILOSEC) 40 MG capsule   Oral   Take 40 mg by mouth daily.            BP 154/59  Pulse 73  Temp(Src) 97.9 F (36.6 C) (Oral)  Resp 16  SpO2 96% Physical Exam  Nursing note and vitals reviewed. Constitutional: She is oriented to person, place, and time. She appears well-developed and well-nourished. No distress.  HENT:  Head: Normocephalic and atraumatic.  Cardiovascular: Normal rate and regular rhythm.  Exam reveals no gallop and no friction rub.   No murmur heard. Pulmonary/Chest: Breath sounds normal. No respiratory distress. She has no wheezes. She has no rales.  Musculoskeletal: Normal range of motion.  Neurological: She is alert and oriented to person, place, and time. Coordination normal.  Skin: Skin is warm and dry. No rash noted. She is not diaphoretic.  Psychiatric: She has a normal mood and affect. Judgment normal.    ED Course   Procedures (including critical care time)  Labs Reviewed  GLUCOSE, CAPILLARY - Abnormal; Notable for the following:    Glucose-Capillary 212 (*)    All other components within normal limits   No results found. 1. Diabetes mellitus type 2 with peripheral artery disease   2. Hyperlipidemia   3. CAD (coronary artery disease)   4. Medication refill     MDM  Insulin refilled. She is out of her Lipitor, I will refill this for her as well. She will followup with primary care as scheduled in a few days.  Meds ordered this encounter  Medications  . atorvastatin (LIPITOR) 80 MG tablet    Sig: Take 1 tablet (80 mg total) by mouth daily.    Dispense:  30 tablet    Refill:  0  . insulin NPH (HUMULIN N,NOVOLIN N) 100 UNIT/ML injection    Sig: Inject 34-38 Units into the skin 2 (two) times daily. 38 in am and 34 at night    Dispense:  3 vial    Refill:  [place] Payam Gribble, PA-C 11/27/12 1507

## 2012-11-27 NOTE — ED Notes (Signed)
C/o she is out of her medications, and cannot get refills from pharmacy until she sees MD alter this month; used her last insulin

## 2012-12-02 NOTE — ED Provider Notes (Signed)
Medical screening examination/treatment/procedure(s) were performed by resident physician or non-physician practitioner and as supervising physician I was immediately available for consultation/collaboration.   Pauline Good MD.   Billy Fischer, MD 12/02/12 [phone]

## 2013-01-04 ENCOUNTER — Other Ambulatory Visit (HOSPITAL_COMMUNITY): Payer: Self-pay | Admitting: Internal Medicine

## 2013-01-04 DIAGNOSIS — R0989 Other specified symptoms and signs involving the circulatory and respiratory systems: Secondary | ICD-10-CM

## 2013-01-09 ENCOUNTER — Ambulatory Visit (HOSPITAL_COMMUNITY)
Admission: RE | Admit: 2013-01-09 | Discharge: 2013-01-09 | Disposition: A | Discharge status: Home / Self Care | Payer: Medicare Other | Source: Ambulatory Visit | Attending: Internal Medicine | Admitting: Internal Medicine

## 2013-01-09 DIAGNOSIS — R0989 Other specified symptoms and signs involving the circulatory and respiratory systems: Secondary | ICD-10-CM | POA: Insufficient documentation

## 2013-01-09 NOTE — Progress Notes (Signed)
VASCULAR LAB PRELIMINARY  PRELIMINARY  PRELIMINARY  PRELIMINARY  Carotid duplex  completed.    Preliminary report:  Bilateral:  1-39% ICA stenosis.  Vertebral artery flow is antegrade.      Roniya Tetro, RVT 01/09/2013, 1:44 PM

## 2013-07-05 ENCOUNTER — Encounter: Payer: Self-pay | Admitting: Vascular Surgery

## 2013-07-06 ENCOUNTER — Ambulatory Visit (HOSPITAL_COMMUNITY)
Admission: RE | Admit: 2013-07-06 | Discharge: 2013-07-06 | Disposition: A | Discharge status: Home / Self Care | Payer: Medicare Other | Source: Ambulatory Visit | Attending: Vascular Surgery | Admitting: Vascular Surgery

## 2013-07-06 ENCOUNTER — Ambulatory Visit (INDEPENDENT_AMBULATORY_CARE_PROVIDER_SITE_OTHER)
Admission: RE | Admit: 2013-07-06 | Discharge: 2013-07-06 | Disposition: A | Discharge status: Home / Self Care | Payer: Medicare Other | Source: Ambulatory Visit | Attending: Vascular Surgery | Admitting: Vascular Surgery

## 2013-07-06 ENCOUNTER — Encounter: Payer: Self-pay | Admitting: Vascular Surgery

## 2013-07-06 ENCOUNTER — Other Ambulatory Visit: Payer: Self-pay | Admitting: Vascular Surgery

## 2013-07-06 ENCOUNTER — Ambulatory Visit (INDEPENDENT_AMBULATORY_CARE_PROVIDER_SITE_OTHER): Payer: Medicare Other | Admitting: Vascular Surgery

## 2013-07-06 ENCOUNTER — Ambulatory Visit (HOSPITAL_COMMUNITY): Admission: RE | Admit: 2013-07-06 | Payer: Medicare Other | Source: Ambulatory Visit

## 2013-07-06 VITALS — BP 163/57 | HR 65 | Ht 66.0 in | Wt 201.0 lb

## 2013-07-06 DIAGNOSIS — I739 Peripheral vascular disease, unspecified: Secondary | ICD-10-CM

## 2013-07-06 DIAGNOSIS — Z48812 Encounter for surgical aftercare following surgery on the circulatory system: Secondary | ICD-10-CM

## 2013-07-06 DIAGNOSIS — I70219 Atherosclerosis of native arteries of extremities with intermittent claudication, unspecified extremity: Secondary | ICD-10-CM

## 2013-07-06 NOTE — Progress Notes (Signed)
    Established Intermittent Claudication  History of Present Illness  Kristina Summers is a 78 y.o. ([birth year]) female who presents with chief complaint: routine surveillance.  Prior procedures include: 1. L CIA PTA+S (04/01/11) 2. L SFA OA+PTA (06/17/11) 3. L distal SFA OA+PTA (06/17/11)  The patient's symptoms have resolved.  The patient's symptoms are: none.  The patient's treatment regimen currently included: maximal medical management and walking.  The patient's PMH, PSH, SH, FamHx, Med, and Allergies are unchanged from 03/24/12.  On ROS today: no intermittent claudication , no wounds, no rest pain  Physical Examination  Filed Vitals:   07/06/13 1001  BP: 163/57  Pulse: 65  Height: 5\' 6"  (1.676 m)  Weight: 201 lb (91.173 kg)  SpO2: 98%   Body mass index is 32.46 kg/(m^2).  General: A&O x 3, WD, Obese,   Pulmonary: Sym exp, good air movt, CTAB, no rales, rhonchi, & wheezing  Cardiac: RRR, Nl S1, S2, no Murmurs, rubs or gallops  Vascular: Vessel Right Left  Radial Palpable Palpable  Brachial Palpable Palpable  Carotid Palpable, without bruit Palpable, without bruit  Aorta Not palpable N/A  Femoral Palpable Palpable  Popliteal Not palpable Not palpable  PT Palpable Palpable  DP Palpable Palpable   Gastrointestinal: soft, NTND, -G/R, - HSM, - masses, - CVAT B, no palpable aorta due obesity  Musculoskeletal: M/S 5/5 throughout , Extremities without ischemic changes , spider veins bilaterally  Neurologic: Pain and light touch intact in extremities , Motor exam as listed above  Non-Invasive Vascular Imaging ABI (Date: 07/06/2013)  R: .89 (0.93), DP: bi, PT: bi, TBI: 0.62  L: 1.05 (1.08), DP: bi, PT: bi, TBI: 0.65  Aortoiliac Duplex (Date: 07/06/2013)  Ao: 72 c/s  R iliac: 147 c/s PSV in-stent  Medical Decision Making  Kristina Summers is a 78 y.o. female who presents with:  Resolved bilateral leg intermittent claudication without evidence of critical limb  ischemia.  Based on the patient's vascular studies and examination, I have offered the patient: q6 month ABI and aortoiliac duplex and LLE arterial duplex.  I discussed in depth with the patient the nature of atherosclerosis, and emphasized the importance of maximal medical management including strict control of blood pressure, blood glucose, and lipid levels, antiplatelet agents, obtaining regular exercise, and cessation of smoking.    The patient is aware that without maximal medical management the underlying atherosclerotic disease process will progress, limiting the benefit of any interventions. The patient is currently on a statin: Lipitor. The patient is currently on an anti-platelet: ASA.  Thank you for allowing [place] to participate in this patient's care.  Adele Barthel, MD [facility] [place] Office: [phone] Pager: [phone]  07/06/2013, 11:42 AM

## 2013-07-06 NOTE — Addendum Note (Signed)
Addended by: Dorthula Rue L on: 07/06/2013 06:46 PM   Modules accepted: Orders

## 2013-08-20 ENCOUNTER — Emergency Department (INDEPENDENT_AMBULATORY_CARE_PROVIDER_SITE_OTHER)
Admission: EM | Admit: 2013-08-20 | Discharge: 2013-08-20 | Disposition: A | Discharge status: Home / Self Care | Payer: Medicare Other | Source: Home / Self Care | Attending: Emergency Medicine | Admitting: Emergency Medicine

## 2013-08-20 ENCOUNTER — Encounter (HOSPITAL_COMMUNITY): Payer: Self-pay | Admitting: Emergency Medicine

## 2013-08-20 DIAGNOSIS — L02619 Cutaneous abscess of unspecified foot: Secondary | ICD-10-CM

## 2013-08-20 DIAGNOSIS — L03039 Cellulitis of unspecified toe: Secondary | ICD-10-CM

## 2013-08-20 DIAGNOSIS — Z23 Encounter for immunization: Secondary | ICD-10-CM

## 2013-08-20 DIAGNOSIS — L03031 Cellulitis of right toe: Secondary | ICD-10-CM

## 2013-08-20 MED ORDER — TETANUS-DIPHTH-ACELL PERTUSSIS 5-2.5-18.5 LF-MCG/0.5 IM SUSP
0.5000 mL | Freq: Once | INTRAMUSCULAR | Status: AC
  Administered 2013-08-20: 0.5 mL via INTRAMUSCULAR

## 2013-08-20 MED ORDER — TETANUS-DIPHTH-ACELL PERTUSSIS 5-2.5-18.5 LF-MCG/0.5 IM SUSP
INTRAMUSCULAR | Status: AC
  Filled 2013-08-20: qty 0.5

## 2013-08-20 MED ORDER — AMOXICILLIN-POT CLAVULANATE 875-125 MG PO TABS: 1.0000 | ORAL_TABLET | Freq: Two times a day (BID) | ORAL | Status: DC

## 2013-08-20 NOTE — ED Notes (Signed)
Pt  Reports    Swelling  r  Big  Toe     With  Some  Redness        And  Drainage  Present      -  Pt  Is  Diabetic        Who  Takes  Insulin

## 2013-08-20 NOTE — ED Provider Notes (Signed)
CSN: [id number]     Arrival date & time 08/20/13  1138 History   First MD Initiated Contact with Patient 08/20/13 1340     Chief Complaint  Patient presents with  . Toe Pain   (Consider location/radiation/quality/duration/timing/severity/associated sxs/prior Treatment) HPI Comments: Patient presents with a one week hx of weeping erythema of her right great toe. States area is not painful, but also states that she has long standing diabetic neuropathy of both feet. Denies fever or chills.  PCP: Dr. [name] No hx of gout or recent injury.   The history is provided by the patient.    Past Medical History  Diagnosis Date  . CAD (coronary artery disease)   . Obesity   . Atrial fibrillation   . Hypertension   . Diabetes mellitus   . Hyperlipidemia   . Peripheral vascular disease   . GI bleed 1/11  . Carotid artery occlusion   . Normal nuclear stress test 03/18/2011  . Angina   . GERD (gastroesophageal reflux disease)   . Arthritis   . Fibromyalgia   . Myocardial infarction ~ 1991   Past Surgical History  Procedure Laterality Date  . Trigger finger release  1996    right thumb  . Cataract extraction w/ intraocular lens  implant, bilateral  2004-2005  . Coronary artery bypass graft  2000    CABG X5  . Peripheral arterial stent graft  04/01/11    right common iliac  . Angioplasty  06/17/11    Left leg common femoral artery cannulation under u/s Left leg runoff  . Eye surgery     Family History  Problem Relation Age of Onset  . Other Brother     intestinal blockage   History  Substance Use Topics  . Smoking status: Former Smoker -- 2.00 packs/day for 30 years    Types: Cigarettes  . Smokeless tobacco: Never Used     Comment: stopped smoking cigarettes 1991  . Alcohol Use: No   OB History   Grav Para Term Preterm Abortions TAB SAB Ect Mult Living                 Review of Systems  All other systems reviewed and are negative.   Allergies  Morphine and related;  Lisinopril; Sertraline hcl; and Zoloft  Home Medications   Prior to Admission medications   Medication Sig Start Date End Date Taking? Authorizing Provider  amLODipine (NORVASC) 5 MG tablet Take 1 tablet by mouth daily. 03/06/12   Historical Provider, MD  aspirin 81 MG tablet Take 81 mg by mouth daily.    Historical Provider, MD  atorvastatin (LIPITOR) 80 MG tablet Take 1 tablet (80 mg total) by mouth daily. 11/27/12   Liam Graham, PA-C  Calcium Carbonate-Vitamin D (CALCIUM PLUS VITAMIN D PO) Take 1 tablet by mouth daily.    Historical Provider, MD  cholecalciferol (VITAMIN D) 1000 UNITS tablet Take 1,000 Units by mouth 2 (two) times daily.    Historical Provider, MD  furosemide (LASIX) 40 MG tablet Take 20-60 mg by mouth 2 (two) times daily. Pt takes 1.5 tabs (60mg ) in the morning and 1/2 tablet (20mg ) in the evening    Historical Provider, MD  insulin NPH (HUMULIN N,NOVOLIN N) 100 UNIT/ML injection Inject 34-38 Units into the skin 2 (two) times daily. 38 in am and 34 at night 11/27/12   Liam Graham, PA-C  losartan (COZAAR) 50 MG tablet Take 50 mg by mouth 2 (two) times daily.  Historical Provider, MD  LUTEIN PO Take by mouth.    Historical Provider, MD  metFORMIN (GLUCOPHAGE) 500 MG tablet  12/09/11   Historical Provider, MD  metoprolol tartrate (LOPRESSOR) 25 MG tablet Take 25 mg by mouth 2 (two) times daily.      Historical Provider, MD  Multiple Vitamin (MULTIVITAMIN) tablet Take 1 tablet by mouth daily.    Historical Provider, MD  NOVOLOG MIX 70/30 FLEXPEN (70-30) 100 UNIT/ML injection  03/08/12   Historical Provider, MD  omeprazole (PRILOSEC) 40 MG capsule Take 40 mg by mouth daily.      Historical Provider, MD   BP 145/70  Pulse 87  Temp(Src) 98.8 F (37.1 C) (Oral)  Resp 16  SpO2 96% Physical Exam  Nursing note and vitals reviewed. Constitutional: She is oriented to person, place, and time. She appears well-developed and well-nourished. No distress.  HENT:  Head:  Normocephalic and atraumatic.  Eyes: Conjunctivae are normal. No scleral icterus.  Cardiovascular: Normal rate.   Pulmonary/Chest: Effort normal.  Musculoskeletal: Normal range of motion.       Right foot: She exhibits swelling. She exhibits normal range of motion, no tenderness, no bony tenderness, normal capillary refill, no crepitus, no deformity and no laceration.       Feet:  Area outlined is area of weeping erythema with moderate STS and mild induration. No paronychia or areas of fluctuance.   Neurological: She is alert and oriented to person, place, and time.  Skin: Skin is warm and dry.  Psychiatric: She has a normal mood and affect. Her behavior is normal.    ED Course  Procedures (including critical care time) Labs Review Labs Reviewed - No data to display  Imaging Review No results found.   MDM   1. Cellulitis of great toe, right   Local wound care demonstrated to patient. Tetanus booster given at [facility]. Augmentin as prescribed with PCP follow up in 3-5 days for wound re-check. Advised patient to follow up at [facility] if her PCP not available.    [place], [place] 08/20/13 [phone]

## 2013-08-20 NOTE — ED Provider Notes (Signed)
Medical screening examination/treatment/procedure(s) were performed by non-physician practitioner and as supervising physician I was immediately available for consultation/collaboration.  Philipp Deputy, M.D.  Harden Mo, MD 08/20/13 [phone]

## 2013-08-20 NOTE — Discharge Instructions (Signed)
Cellulitis Cellulitis is an infection of the skin and the tissue beneath it. The infected area is usually red and tender. Cellulitis occurs most often in the arms and lower legs.  CAUSES  Cellulitis is caused by bacteria that enter the skin through cracks or cuts in the skin. The most common types of bacteria that cause cellulitis are Staphylococcus and Streptococcus. SYMPTOMS   Redness and warmth.  Swelling.  Tenderness or pain.  Fever. DIAGNOSIS  Your caregiver can usually determine what is wrong based on a physical exam. Blood tests may also be done. TREATMENT  Treatment usually involves taking an antibiotic medicine. HOME CARE INSTRUCTIONS   Take your antibiotics as directed. Finish them even if you start to feel better.  Keep the infected arm or leg elevated to reduce swelling.  Apply a warm cloth to the affected area up to 4 times per day to relieve pain.  Only take over-the-counter or prescription medicines for pain, discomfort, or fever as directed by your caregiver.  Keep all follow-up appointments as directed by your caregiver. SEEK MEDICAL CARE IF:   You notice red streaks coming from the infected area.  Your red area gets larger or turns dark in color.  Your bone or joint underneath the infected area becomes painful after the skin has healed.  Your infection returns in the same area or another area.  You notice a swollen bump in the infected area.  You develop new symptoms. SEEK IMMEDIATE MEDICAL CARE IF:   You have a fever.  You feel very sleepy.  You develop vomiting or diarrhea.  You have a general ill feeling (malaise) with muscle aches and pains. MAKE SURE YOU:   Understand these instructions.  Will watch your condition.  Will get help right away if you are not doing well or get worse. Document Released: 01/13/2005 Document Revised: 10/05/2011 Document Reviewed: 06/21/2011 [employer] Patient Information 2014 [employer].  

## 2013-08-23 ENCOUNTER — Emergency Department (HOSPITAL_COMMUNITY): Payer: Medicare Other

## 2013-08-23 ENCOUNTER — Inpatient Hospital Stay (HOSPITAL_COMMUNITY)
Admission: EM | Admit: 2013-08-23 | Discharge: 2013-08-27 | DRG: 603 | Disposition: A | Discharge status: Home / Self Care | Payer: Medicare Other | Attending: Internal Medicine | Admitting: Internal Medicine

## 2013-08-23 ENCOUNTER — Encounter (HOSPITAL_COMMUNITY): Payer: Self-pay | Admitting: Emergency Medicine

## 2013-08-23 ENCOUNTER — Emergency Department (INDEPENDENT_AMBULATORY_CARE_PROVIDER_SITE_OTHER)
Admission: EM | Admit: 2013-08-23 | Discharge: 2013-08-23 | Disposition: A | Discharge status: Other Acute Inpatient Hospital | Payer: Medicare Other | Source: Home / Self Care | Attending: Family Medicine | Admitting: Family Medicine

## 2013-08-23 DIAGNOSIS — Z794 Long term (current) use of insulin: Secondary | ICD-10-CM

## 2013-08-23 DIAGNOSIS — L03039 Cellulitis of unspecified toe: Principal | ICD-10-CM

## 2013-08-23 DIAGNOSIS — Z6832 Body mass index (BMI) 32.0-32.9, adult: Secondary | ICD-10-CM

## 2013-08-23 DIAGNOSIS — I70219 Atherosclerosis of native arteries of extremities with intermittent claudication, unspecified extremity: Secondary | ICD-10-CM

## 2013-08-23 DIAGNOSIS — L03119 Cellulitis of unspecified part of limb: Secondary | ICD-10-CM | POA: Diagnosis not present

## 2013-08-23 DIAGNOSIS — I252 Old myocardial infarction: Secondary | ICD-10-CM

## 2013-08-23 DIAGNOSIS — E119 Type 2 diabetes mellitus without complications: Secondary | ICD-10-CM

## 2013-08-23 DIAGNOSIS — E1151 Type 2 diabetes mellitus with diabetic peripheral angiopathy without gangrene: Secondary | ICD-10-CM

## 2013-08-23 DIAGNOSIS — Z87891 Personal history of nicotine dependence: Secondary | ICD-10-CM

## 2013-08-23 DIAGNOSIS — L0390 Cellulitis, unspecified: Secondary | ICD-10-CM | POA: Diagnosis present

## 2013-08-23 DIAGNOSIS — Z7982 Long term (current) use of aspirin: Secondary | ICD-10-CM

## 2013-08-23 DIAGNOSIS — I482 Chronic atrial fibrillation, unspecified: Secondary | ICD-10-CM | POA: Diagnosis present

## 2013-08-23 DIAGNOSIS — L02619 Cutaneous abscess of unspecified foot: Secondary | ICD-10-CM | POA: Diagnosis not present

## 2013-08-23 DIAGNOSIS — I739 Peripheral vascular disease, unspecified: Secondary | ICD-10-CM

## 2013-08-23 DIAGNOSIS — E1159 Type 2 diabetes mellitus with other circulatory complications: Secondary | ICD-10-CM

## 2013-08-23 DIAGNOSIS — IMO0001 Reserved for inherently not codable concepts without codable children: Secondary | ICD-10-CM | POA: Diagnosis present

## 2013-08-23 DIAGNOSIS — L03031 Cellulitis of right toe: Secondary | ICD-10-CM

## 2013-08-23 DIAGNOSIS — E669 Obesity, unspecified: Secondary | ICD-10-CM

## 2013-08-23 DIAGNOSIS — Z8719 Personal history of other diseases of the digestive system: Secondary | ICD-10-CM

## 2013-08-23 DIAGNOSIS — I2510 Atherosclerotic heart disease of native coronary artery without angina pectoris: Secondary | ICD-10-CM

## 2013-08-23 DIAGNOSIS — Z79899 Other long term (current) drug therapy: Secondary | ICD-10-CM

## 2013-08-23 DIAGNOSIS — E785 Hyperlipidemia, unspecified: Secondary | ICD-10-CM

## 2013-08-23 DIAGNOSIS — I10 Essential (primary) hypertension: Secondary | ICD-10-CM

## 2013-08-23 DIAGNOSIS — Z888 Allergy status to other drugs, medicaments and biological substances status: Secondary | ICD-10-CM

## 2013-08-23 DIAGNOSIS — I119 Hypertensive heart disease without heart failure: Secondary | ICD-10-CM

## 2013-08-23 DIAGNOSIS — I798 Other disorders of arteries, arterioles and capillaries in diseases classified elsewhere: Secondary | ICD-10-CM

## 2013-08-23 DIAGNOSIS — L03115 Cellulitis of right lower limb: Secondary | ICD-10-CM

## 2013-08-23 DIAGNOSIS — I4891 Unspecified atrial fibrillation: Secondary | ICD-10-CM

## 2013-08-23 DIAGNOSIS — K219 Gastro-esophageal reflux disease without esophagitis: Secondary | ICD-10-CM | POA: Diagnosis present

## 2013-08-23 DIAGNOSIS — M129 Arthropathy, unspecified: Secondary | ICD-10-CM | POA: Diagnosis present

## 2013-08-23 DIAGNOSIS — Z951 Presence of aortocoronary bypass graft: Secondary | ICD-10-CM

## 2013-08-23 LAB — CBC WITH DIFFERENTIAL/PLATELET
BASOS ABS: 0.1 10*3/uL (ref 0.0–0.1)
Basophils Relative: 1 % (ref 0–1)
Eosinophils Absolute: 0.3 10*3/uL (ref 0.0–0.7)
Eosinophils Relative: 4 % (ref 0–5)
HCT: 39.6 % (ref 36.0–46.0)
Hemoglobin: 13.9 g/dL (ref 12.0–15.0)
LYMPHS ABS: 1.7 10*3/uL (ref 0.7–4.0)
LYMPHS PCT: 23 % (ref 12–46)
MCH: 32.6 pg (ref 26.0–34.0)
MCHC: 35.1 g/dL (ref 30.0–36.0)
MCV: 93 fL (ref 78.0–100.0)
Monocytes Absolute: 0.6 10*3/uL (ref 0.1–1.0)
Monocytes Relative: 7 % (ref 3–12)
NEUTROS ABS: 4.9 10*3/uL (ref 1.7–7.7)
Neutrophils Relative %: 65 % (ref 43–77)
PLATELETS: 234 10*3/uL (ref 150–400)
RBC: 4.26 MIL/uL (ref 3.87–5.11)
RDW: 12.5 % (ref 11.5–15.5)
WBC: 7.6 10*3/uL (ref 4.0–10.5)

## 2013-08-23 LAB — CBC
HEMATOCRIT: 41.1 % (ref 36.0–46.0)
HEMOGLOBIN: 14.4 g/dL (ref 12.0–15.0)
MCH: 32.5 pg (ref 26.0–34.0)
MCHC: 35 g/dL (ref 30.0–36.0)
MCV: 92.8 fL (ref 78.0–100.0)
Platelets: 255 10*3/uL (ref 150–400)
RBC: 4.43 MIL/uL (ref 3.87–5.11)
RDW: 12.5 % (ref 11.5–15.5)
WBC: 7.4 10*3/uL (ref 4.0–10.5)

## 2013-08-23 LAB — BASIC METABOLIC PANEL
BUN: 20 mg/dL (ref 6–23)
CO2: 25 meq/L (ref 19–32)
Calcium: 9.5 mg/dL (ref 8.4–10.5)
Chloride: 100 mEq/L (ref 96–112)
Creatinine, Ser: 0.72 mg/dL (ref 0.50–1.10)
GFR calc Af Amer: >90 mL/min (ref >90)
GFR calc non Af Amer: 82 mL/min — ABNORMAL LOW (ref >90)
GLUCOSE: 241 mg/dL — AB (ref 70–99)
Potassium: 3.7 mEq/L (ref 3.7–5.3)
SODIUM: 138 meq/L (ref 137–147)

## 2013-08-23 LAB — GLUCOSE, CAPILLARY
Glucose-Capillary: 163 mg/dL — ABNORMAL HIGH (ref 70–99)
Glucose-Capillary: 247 mg/dL — ABNORMAL HIGH (ref 70–99)

## 2013-08-23 LAB — URINALYSIS, ROUTINE W REFLEX MICROSCOPIC
BILIRUBIN URINE: NEGATIVE
GLUCOSE, UA: NEGATIVE mg/dL
HGB URINE DIPSTICK: NEGATIVE
Ketones, ur: NEGATIVE mg/dL
Nitrite: NEGATIVE
PH: 5.5 (ref 5.0–8.0)
Protein, ur: 30 mg/dL — AB
SPECIFIC GRAVITY, URINE: 1.022 (ref 1.005–1.030)
Urobilinogen, UA: 1 mg/dL (ref 0.0–1.0)

## 2013-08-23 LAB — LACTIC ACID, PLASMA: Lactic Acid, Venous: 1.1 mmol/L (ref 0.5–2.2)

## 2013-08-23 LAB — HEPATIC FUNCTION PANEL
ALT: 39 U/L — AB (ref 0–35)
AST: 50 U/L — AB (ref 0–37)
Albumin: 3.2 g/dL — ABNORMAL LOW (ref 3.5–5.2)
Alkaline Phosphatase: 113 U/L (ref 39–117)
Bilirubin, Direct: <0.2 mg/dL (ref 0.0–0.3)
TOTAL PROTEIN: 7.9 g/dL (ref 6.0–8.3)
Total Bilirubin: 0.6 mg/dL (ref 0.3–1.2)

## 2013-08-23 LAB — URINE MICROSCOPIC-ADD ON

## 2013-08-23 LAB — TSH: TSH: 1.46 u[IU]/mL (ref 0.350–4.500)

## 2013-08-23 LAB — CREATININE, SERUM
CREATININE: 0.69 mg/dL (ref 0.50–1.10)
GFR calc Af Amer: >90 mL/min (ref >90)
GFR calc non Af Amer: 83 mL/min — ABNORMAL LOW (ref >90)

## 2013-08-23 MED ORDER — AMLODIPINE BESYLATE 5 MG PO TABS
5.0000 mg | ORAL_TABLET | Freq: Every day | ORAL | Status: DC
  Administered 2013-08-23 – 2013-08-27 (×5): 5 mg via ORAL
  Filled 2013-08-23 (×5): qty 1

## 2013-08-23 MED ORDER — ONDANSETRON HCL 4 MG PO TABS: 4.0000 mg | ORAL_TABLET | Freq: Four times a day (QID) | ORAL | Status: DC | PRN

## 2013-08-23 MED ORDER — ACETAMINOPHEN 325 MG PO TABS: 650.0000 mg | ORAL_TABLET | Freq: Four times a day (QID) | ORAL | Status: DC | PRN

## 2013-08-23 MED ORDER — ONDANSETRON HCL 4 MG/2ML IJ SOLN: 4.0000 mg | Freq: Four times a day (QID) | INTRAMUSCULAR | Status: DC | PRN

## 2013-08-23 MED ORDER — VANCOMYCIN HCL IN DEXTROSE 1-5 GM/200ML-% IV SOLN: 1000.0000 mg | Freq: Two times a day (BID) | INTRAVENOUS | Status: DC

## 2013-08-23 MED ORDER — INSULIN NPH (HUMAN) (ISOPHANE) 100 UNIT/ML ~~LOC~~ SUSP
34.0000 [IU] | Freq: Every day | SUBCUTANEOUS | Status: DC
  Administered 2013-08-23: 34 [IU] via SUBCUTANEOUS
  Filled 2013-08-23: qty 10

## 2013-08-23 MED ORDER — LOSARTAN POTASSIUM 50 MG PO TABS
50.0000 mg | ORAL_TABLET | Freq: Two times a day (BID) | ORAL | Status: DC
  Administered 2013-08-23 – 2013-08-27 (×8): 50 mg via ORAL
  Filled 2013-08-23 (×9): qty 1

## 2013-08-23 MED ORDER — VANCOMYCIN HCL IN DEXTROSE 1-5 GM/200ML-% IV SOLN
1000.0000 mg | Freq: Two times a day (BID) | INTRAVENOUS | Status: DC
  Administered 2013-08-23 – 2013-08-25 (×4): 1000 mg via INTRAVENOUS
  Filled 2013-08-23 (×6): qty 200

## 2013-08-23 MED ORDER — OXYCODONE HCL 5 MG PO TABS: 5.0000 mg | ORAL_TABLET | ORAL | Status: DC | PRN

## 2013-08-23 MED ORDER — ALUM & MAG HYDROXIDE-SIMETH 200-200-20 MG/5ML PO SUSP: 30.0000 mL | Freq: Four times a day (QID) | ORAL | Status: DC | PRN

## 2013-08-23 MED ORDER — PIPERACILLIN-TAZOBACTAM 3.375 G IVPB
3.3750 g | Freq: Three times a day (TID) | INTRAVENOUS | Status: DC
  Administered 2013-08-23 – 2013-08-25 (×5): 3.375 g via INTRAVENOUS
  Filled 2013-08-23 (×7): qty 50

## 2013-08-23 MED ORDER — SODIUM CHLORIDE 0.9 % IJ SOLN
3.0000 mL | Freq: Two times a day (BID) | INTRAMUSCULAR | Status: DC
  Administered 2013-08-24 – 2013-08-26 (×4): 3 mL via INTRAVENOUS

## 2013-08-23 MED ORDER — INSULIN ASPART 100 UNIT/ML ~~LOC~~ SOLN
0.0000 [IU] | Freq: Every day | SUBCUTANEOUS | Status: DC
  Administered 2013-08-23: 2 [IU] via SUBCUTANEOUS
  Administered 2013-08-24: 3 [IU] via SUBCUTANEOUS

## 2013-08-23 MED ORDER — INSULIN ASPART 100 UNIT/ML ~~LOC~~ SOLN
0.0000 [IU] | Freq: Three times a day (TID) | SUBCUTANEOUS | Status: DC
  Administered 2013-08-24 (×2): 5 [IU] via SUBCUTANEOUS
  Administered 2013-08-25: 8 [IU] via SUBCUTANEOUS
  Administered 2013-08-25: 2 [IU] via SUBCUTANEOUS
  Administered 2013-08-26: 11 [IU] via SUBCUTANEOUS
  Administered 2013-08-26: 3 [IU] via SUBCUTANEOUS

## 2013-08-23 MED ORDER — METOPROLOL TARTRATE 25 MG PO TABS
25.0000 mg | ORAL_TABLET | Freq: Two times a day (BID) | ORAL | Status: DC
  Administered 2013-08-23 – 2013-08-27 (×8): 25 mg via ORAL
  Filled 2013-08-23 (×10): qty 1

## 2013-08-23 MED ORDER — FUROSEMIDE 40 MG PO TABS
60.0000 mg | ORAL_TABLET | Freq: Every day | ORAL | Status: DC
  Administered 2013-08-24 – 2013-08-27 (×4): 60 mg via ORAL
  Filled 2013-08-23 (×5): qty 1

## 2013-08-23 MED ORDER — ATORVASTATIN CALCIUM 80 MG PO TABS
80.0000 mg | ORAL_TABLET | Freq: Every day | ORAL | Status: DC
  Administered 2013-08-23 – 2013-08-26 (×4): 80 mg via ORAL
  Filled 2013-08-23 (×5): qty 1

## 2013-08-23 MED ORDER — ACETAMINOPHEN 650 MG RE SUPP: 650.0000 mg | Freq: Four times a day (QID) | RECTAL | Status: DC | PRN

## 2013-08-23 MED ORDER — DOCUSATE SODIUM 100 MG PO CAPS
100.0000 mg | ORAL_CAPSULE | Freq: Two times a day (BID) | ORAL | Status: DC
  Administered 2013-08-23 – 2013-08-27 (×8): 100 mg via ORAL
  Filled 2013-08-23 (×9): qty 1

## 2013-08-23 MED ORDER — SODIUM CHLORIDE 0.9 % IV SOLN: 250.0000 mL | INTRAVENOUS | Status: DC | PRN

## 2013-08-23 MED ORDER — ASPIRIN 81 MG PO CHEW
81.0000 mg | CHEWABLE_TABLET | Freq: Every day | ORAL | Status: DC
  Administered 2013-08-23 – 2013-08-27 (×5): 81 mg via ORAL
  Filled 2013-08-23 (×5): qty 1

## 2013-08-23 MED ORDER — SODIUM CHLORIDE 0.9 % IJ SOLN: 3.0000 mL | INTRAMUSCULAR | Status: DC | PRN

## 2013-08-23 MED ORDER — ENOXAPARIN SODIUM 40 MG/0.4ML ~~LOC~~ SOLN
40.0000 mg | SUBCUTANEOUS | Status: DC
  Administered 2013-08-23 – 2013-08-26 (×4): 40 mg via SUBCUTANEOUS
  Filled 2013-08-23 (×5): qty 0.4

## 2013-08-23 MED ORDER — INSULIN NPH (HUMAN) (ISOPHANE) 100 UNIT/ML ~~LOC~~ SUSP
38.0000 [IU] | Freq: Every day | SUBCUTANEOUS | Status: DC
  Filled 2013-08-23: qty 10

## 2013-08-23 MED ORDER — PIPERACILLIN-TAZOBACTAM 3.375 G IVPB: 3.3750 g | Freq: Four times a day (QID) | INTRAVENOUS | Status: DC

## 2013-08-23 MED ORDER — PIPERACILLIN-TAZOBACTAM 3.375 G IVPB
3.3750 g | Freq: Once | INTRAVENOUS | Status: AC
  Administered 2013-08-23: 3.375 g via INTRAVENOUS
  Filled 2013-08-23: qty 50

## 2013-08-23 MED ORDER — VANCOMYCIN HCL IN DEXTROSE 1-5 GM/200ML-% IV SOLN: 1000.0000 mg | Freq: Once | INTRAVENOUS | Status: DC

## 2013-08-23 NOTE — ED Notes (Signed)
Pt from [facility] with c/o cellulitis of right great toe/foot. Seen on 5/4 for same and started on Augmentin. Reports condition has worsened despite taking medication and redness now extends to her right forefoot, has lost her  toenail and has developed several large  Weeping areas on toe and forefoot. Denies fever or chills. AO x4.

## 2013-08-23 NOTE — ED Provider Notes (Signed)
CSN: [id number]     Arrival date & time 08/23/13  1043 History   First MD Initiated Contact with Patient 08/23/13 1159     Chief Complaint  Patient presents with  . Wound Check   (Consider location/radiation/quality/duration/timing/severity/associated sxs/prior Treatment) HPI Comments: Patient seen by me on 08-20-2013 for cellulitis of right great toe and placed on Augmentin and instructed to follow up here or with PCP. She returns here as her condition has not responded favorably to outpatient management. Despite taking medication as prescribed has had extension of infection to her right forefoot, has lost her right great toenail and has developed several large weeping bullous lesions of toe and forefoot. Denies fever or chills.   Patient is a 78 y.o. female presenting with wound check. The history is provided by the patient.  Wound Check    Past Medical History  Diagnosis Date  . CAD (coronary artery disease)   . Obesity   . Atrial fibrillation   . Hypertension   . Diabetes mellitus   . Hyperlipidemia   . Peripheral vascular disease   . GI bleed 1/11  . Carotid artery occlusion   . Normal nuclear stress test 03/18/2011  . Angina   . GERD (gastroesophageal reflux disease)   . Arthritis   . Fibromyalgia   . Myocardial infarction ~ 1991   Past Surgical History  Procedure Laterality Date  . Trigger finger release  1996    right thumb  . Cataract extraction w/ intraocular lens  implant, bilateral  2004-2005  . Coronary artery bypass graft  2000    CABG X5  . Peripheral arterial stent graft  04/01/11    right common iliac  . Angioplasty  06/17/11    Left leg common femoral artery cannulation under u/s Left leg runoff  . Eye surgery     Family History  Problem Relation Age of Onset  . Other Brother     intestinal blockage   History  Substance Use Topics  . Smoking status: Former Smoker -- 2.00 packs/day for 30 years    Types: Cigarettes  . Smokeless tobacco: Never Used       Comment: stopped smoking cigarettes 1991  . Alcohol Use: No   OB History   Grav Para Term Preterm Abortions TAB SAB Ect Mult Living                 Review of Systems  All other systems reviewed and are negative.   Allergies  Morphine and related; Lisinopril; Sertraline hcl; and Zoloft  Home Medications   Prior to Admission medications   Medication Sig Start Date End Date Taking? Authorizing Provider  amLODipine (NORVASC) 5 MG tablet Take 1 tablet by mouth daily. 03/06/12   Historical Provider, MD  amoxicillin-clavulanate (AUGMENTIN) 875-125 MG per tablet Take 1 tablet by mouth every 12 (twelve) hours. X 7 days 08/20/13   Annett Gula Blanca Thornton, PA  aspirin 81 MG tablet Take 81 mg by mouth daily.    Historical Provider, MD  atorvastatin (LIPITOR) 80 MG tablet Take 1 tablet (80 mg total) by mouth daily. 11/27/12   Liam Graham, PA-C  Calcium Carbonate-Vitamin D (CALCIUM PLUS VITAMIN D PO) Take 1 tablet by mouth daily.    Historical Provider, MD  cholecalciferol (VITAMIN D) 1000 UNITS tablet Take 1,000 Units by mouth 2 (two) times daily.    Historical Provider, MD  furosemide (LASIX) 40 MG tablet Take 20-60 mg by mouth 2 (two) times daily. Pt takes 1.5 tabs (  60mg ) in the morning and 1/2 tablet (20mg ) in the evening    Historical Provider, MD  insulin NPH (HUMULIN N,NOVOLIN N) 100 UNIT/ML injection Inject 34-38 Units into the skin 2 (two) times daily. 38 in am and 34 at night 11/27/12   Liam Graham, PA-C  losartan (COZAAR) 50 MG tablet Take 50 mg by mouth 2 (two) times daily.      Historical Provider, MD  LUTEIN PO Take by mouth.    Historical Provider, MD  metFORMIN (GLUCOPHAGE) 500 MG tablet  12/09/11   Historical Provider, MD  metoprolol tartrate (LOPRESSOR) 25 MG tablet Take 25 mg by mouth 2 (two) times daily.      Historical Provider, MD  Multiple Vitamin (MULTIVITAMIN) tablet Take 1 tablet by mouth daily.    Historical Provider, MD  NOVOLOG MIX 70/30 FLEXPEN (70-30) 100 UNIT/ML  injection  03/08/12   Historical Provider, MD  omeprazole (PRILOSEC) 40 MG capsule Take 40 mg by mouth daily.      Historical Provider, MD   There were no vitals taken for this visit. Physical Exam  Nursing note and vitals reviewed. Constitutional: She is oriented to person, place, and time. She appears well-developed and well-nourished. No distress.  HENT:  Head: Normocephalic and atraumatic.  Eyes: Conjunctivae are normal.  Cardiovascular: Normal rate.   Pulmonary/Chest: Effort normal.  Musculoskeletal: Normal range of motion.       Feet:  outlined area is of weeping bullous cellulitis  Neurological: She is alert and oriented to person, place, and time.  Psychiatric: She has a normal mood and affect. Her behavior is normal.    ED Course  Procedures (including critical care time) Labs Review Labs Reviewed - No data to display  Imaging Review No results found.   MDM   1. Cellulitis of right foot   Progressive cellulitis of right foot that has not responded favorably to outpatient management in a 78 y/o diabetic patient. Will transfer to ER for possible admission for debridement and IV antibiotic therapy.     [place], [place] 08/23/13 1215

## 2013-08-23 NOTE — ED Notes (Signed)
Patient transported to X-ray 

## 2013-08-23 NOTE — ED Provider Notes (Signed)
CSN: [id number]     Arrival date & time 08/23/13  1236 History   First MD Initiated Contact with Patient 08/23/13 1533     Chief Complaint  Patient presents with  . Cellulitis     HPI Patient presents redness of her right great toe over the past week.  She was started on Augmentin 3 and half days ago without improvement in her symptoms and now feels as though the erythema spreading proximally on her foot.  She's had no fevers or chills.  She does have a history of peripheral vascular disease with stenting of her common femoral artery several years ago.  She has normal pulses currently in her right foot.  No drainage.  No prior trauma or injury to the right great toe.  Symptoms are mild in severity.  She's a diabetic.  She does not describe any significant pain at the toe.   Past Medical History  Diagnosis Date  . CAD (coronary artery disease)   . Obesity   . Atrial fibrillation   . Hypertension   . Diabetes mellitus   . Hyperlipidemia   . Peripheral vascular disease   . GI bleed 1/11  . Carotid artery occlusion   . Normal nuclear stress test 03/18/2011  . Angina   . GERD (gastroesophageal reflux disease)   . Arthritis   . Fibromyalgia   . Myocardial infarction ~ 1991   Past Surgical History  Procedure Laterality Date  . Trigger finger release  1996    right thumb  . Cataract extraction w/ intraocular lens  implant, bilateral  2004-2005  . Coronary artery bypass graft  2000    CABG X5  . Peripheral arterial stent graft  04/01/11    right common iliac  . Angioplasty  06/17/11    Left leg common femoral artery cannulation under u/s Left leg runoff  . Eye surgery     Family History  Problem Relation Age of Onset  . Other Brother     intestinal blockage   History  Substance Use Topics  . Smoking status: Former Smoker -- 2.00 packs/day for 30 years    Types: Cigarettes  . Smokeless tobacco: Never Used     Comment: stopped smoking cigarettes 1991  . Alcohol Use: No   OB  History   Grav Para Term Preterm Abortions TAB SAB Ect Mult Living                 Review of Systems  All other systems reviewed and are negative.     Allergies  Morphine and related; Lisinopril; Sertraline hcl; and Zoloft  Home Medications   Prior to Admission medications   Medication Sig Start Date End Date Taking? Authorizing Provider  amLODipine (NORVASC) 5 MG tablet Take 1 tablet by mouth daily. 03/06/12  Yes Historical Provider, MD  amoxicillin-clavulanate (AUGMENTIN) 875-125 MG per tablet Take 1 tablet by mouth every 12 (twelve) hours. X 7 days 08/20/13  Yes Annett Gula [place], [place]  aspirin 81 MG tablet Take 81 mg by mouth daily.   Yes Historical Provider, MD  atorvastatin (LIPITOR) 80 MG tablet Take 1 tablet (80 mg total) by mouth daily. 11/27/12  Yes Liam Graham, PA-C  Calcium Carbonate-Vitamin D (CALCIUM PLUS VITAMIN D PO) Take 1 tablet by mouth daily.   Yes Historical Provider, MD  furosemide (LASIX) 40 MG tablet Take 20-60 mg by mouth 2 (two) times daily. Pt takes 1.5 tabs (60mg ) in the morning and 1/2 tablet (20mg ) in the  evening   Yes Historical Provider, MD  insulin NPH Human (HUMULIN N,NOVOLIN N) 100 UNIT/ML injection Inject into the skin.   Yes Historical Provider, MD  insulin NPH Human (HUMULIN N,NOVOLIN N) 100 UNIT/ML injection Inject 34-38 Units into the skin 2 (two) times daily. 38 in am and 34 at night 11/27/12  Yes Liam Graham, PA-C  losartan (COZAAR) 50 MG tablet Take 50 mg by mouth 2 (two) times daily.     Yes Historical Provider, MD  LUTEIN PO Take 1 tablet by mouth daily.    Yes Historical Provider, MD  metFORMIN (GLUCOPHAGE) 500 MG tablet  12/09/11  Yes Historical Provider, MD  metoprolol tartrate (LOPRESSOR) 25 MG tablet Take 25 mg by mouth 2 (two) times daily.    Yes Historical Provider, MD  Multiple Vitamin (MULTIVITAMIN) tablet Take 1 tablet by mouth daily.   Yes Historical Provider, MD   BP 144/84  Pulse 70  Temp(Src) 98 F (36.7 C) (Oral)   Resp 18  SpO2 97% Physical Exam  Nursing note and vitals reviewed. Constitutional: She is oriented to person, place, and time. She appears well-developed and well-nourished. No distress.  HENT:  Head: Normocephalic and atraumatic.  Eyes: EOM are normal.  Neck: Normal range of motion.  Cardiovascular: Normal rate, regular rhythm and normal heart sounds.   Pulmonary/Chest: Effort normal and breath sounds normal.  Abdominal: Soft. She exhibits no distension. There is no tenderness.  Musculoskeletal: Normal range of motion.  Erythema of right great toe with 2 bullous lesions of the right great toe near the proximal end of the proximal phalanx.  She has lost the nail on her right great toe.  No obvious fluctuance.  There is swelling of the right great toe with erythema and warmth.  There is cellulitis extending to the distal one third of her right foot  Neurological: She is alert and oriented to person, place, and time.  Skin: Skin is warm and dry.  Psychiatric: She has a normal mood and affect. Judgment normal.    ED Course  Procedures (including critical care time) Labs Review Labs Reviewed  BASIC METABOLIC PANEL - Abnormal; Notable for the following:    Glucose, Bld 241 (*)    GFR calc non Af Amer 82 (*)    All other components within normal limits  HEPATIC FUNCTION PANEL - Abnormal; Notable for the following:    Albumin 3.2 (*)    AST 50 (*)    ALT 39 (*)    All other components within normal limits  CBC WITH DIFFERENTIAL  LACTIC ACID, PLASMA    Imaging Review Dg Foot Complete Right  08/23/2013   CLINICAL DATA:  Redness and cellulitis.  Oozing at the great toe.  EXAM: RIGHT FOOT COMPLETE - 3+ VIEW  COMPARISON:  05/26/2006  FINDINGS: Right foot has diffuse osteopenia. Alignment of the foot is within normal limits. Negative for an acute fracture. There is no evidence for bone erosions or cortical destruction. Spurring at the calcaneus. Degenerative changes along the dorsal aspect of  the midfoot.  IMPRESSION: No acute bone abnormality to the right foot.  Diffuse osteopenia.  No specific findings to suggest bone destruction or osteomyelitis.   Electronically Signed   By: Markus Daft M.D.   On: 08/23/2013 15:14  I personally reviewed the imaging tests through PACS system I reviewed available ER/hospitalization records through the EMR    EKG Interpretation None      MDM   Final diagnoses:  Cellulitis of  great toe, right    Patient with worsening cellulitis of her right great toe extending to her right foot.  She is 78 years old and a diabetic.  She will benefit from admission to the hospital.  Vancomycin and Zosyn.  Afebrile nontoxic.  Dopplerable PT and DP pulse in her right foot.  The patient does not require emergent revascularization.  Initial x-ray demonstrates no ostial.  Patient will likely benefit from MRI of her right great toe if her cellulitis does not respond quickly to antibiotics.  She is at risk of amputation of her right great toe and she understands this.    Hoy Morn, MD 08/23/13 [phone]

## 2013-08-23 NOTE — H&P (Signed)
[facility] History and Physical  Kristina Summers [id number] DOB: 09-Apr-1936 DOA: 08/23/2013  Referring physician:  PCP: [name], MD   Chief Complaint: Right great toe swelling  HPI: Kristina Summers is a 78 y.o. female with a past medical history of PAD  (last ABI performed on 07/06/2013 showing right 0.89 and left 1.05), insulin-dependent type 2 diabetes mellitus, coronary artery disease, hypertension, dyslipidemia, history of GI bleed presented to the emergent apartment on 08/23/2048 with complaints of right great toe pain and swelling. She states that symptoms started approximately one week ago which have progressively worsened. She was seen at the urgent care Center last 1 day where she was diagnosed with cellulitis and given a prescription for Augmentin. Patient followed up today found to have progression of erythema and swelling and referred to the emergency department. Plain films reveal no acute bony abnormality of the right foot. No specific findings to suggest bone destruction or osteomyelitis. In the emergency room she was found to have pulses present of PT and DP of right foot. she was administered a dose of vancomycin and Zosyn. She denies fevers, chills, nausea, vomiting, abdominal pain, chest pain, shortness of breath, diarrhea, constipation, dysuria, hematuria.                                                                                                                                                   Review of Systems:  Constitutional:  No weight loss, night sweats, Fevers, chills, fatigue.  HEENT:  No headaches, Difficulty swallowing,Tooth/dental problems,Sore throat,  No sneezing, itching, ear ache, nasal congestion, post nasal drip,  Cardio-vascular:  No chest pain, Orthopnea, PND, swelling in lower extremities, anasarca, dizziness, palpitations  GI:  No heartburn, indigestion, abdominal pain, nausea, vomiting, diarrhea, change in bowel habits, loss of appetite  Resp:    No shortness of breath with exertion or at rest. No excess mucus, no productive cough, No non-productive cough, No coughing up of blood.No change in color of mucus.No wheezing.No chest wall deformity  Skin:  no rash or lesions.  GU:  no dysuria, change in color of urine, no urgency or frequency. No flank pain.  Musculoskeletal:  Positive for right great toe pain, swelling, erythema. No decreased range of motion. No back pain.  Psych:  No change in mood or affect. No depression or anxiety. No memory loss.   Past Medical History  Diagnosis Date  . CAD (coronary artery disease)   . Obesity   . Atrial fibrillation   . Hypertension   . Diabetes mellitus   . Hyperlipidemia   . Peripheral vascular disease   . GI bleed 1/11  . Carotid artery occlusion   . Normal nuclear stress test 03/18/2011  . Angina   . GERD (gastroesophageal reflux disease)   . Arthritis   . Fibromyalgia   . Myocardial infarction ~ 1991  Past Surgical History  Procedure Laterality Date  . Trigger finger release  1996    right thumb  . Cataract extraction w/ intraocular lens  implant, bilateral  2004-2005  . Coronary artery bypass graft  2000    CABG X5  . Peripheral arterial stent graft  04/01/11    right common iliac  . Angioplasty  06/17/11    Left leg common femoral artery cannulation under u/s Left leg runoff  . Eye surgery     Social History:  reports that she has quit smoking. Her smoking use included Cigarettes. She has a 60 pack-year smoking history. She has never used smokeless tobacco. She reports that she does not drink alcohol or use illicit drugs.  Allergies  Allergen Reactions  . Morphine And Related Nausea And Vomiting    Chest pain    . Lisinopril Cough  . Sertraline Hcl     REACTION: nausea  . Zoloft [Sertraline Hcl]     Nausea     Family History  Problem Relation Age of Onset  . Other Brother     intestinal blockage     Prior to Admission medications   Medication Sig  Start Date End Date Taking? Authorizing Provider  amLODipine (NORVASC) 5 MG tablet Take 1 tablet by mouth daily. 03/06/12  Yes Historical Provider, MD  amoxicillin-clavulanate (AUGMENTIN) 875-125 MG per tablet Take 1 tablet by mouth every 12 (twelve) hours. X 7 days 08/20/13  Yes Annett Gula [place], [place]  aspirin 81 MG tablet Take 81 mg by mouth daily.   Yes Historical Provider, MD  atorvastatin (LIPITOR) 80 MG tablet Take 1 tablet (80 mg total) by mouth daily. 11/27/12  Yes Liam Graham, PA-C  Calcium Carbonate-Vitamin D (CALCIUM PLUS VITAMIN D PO) Take 1 tablet by mouth daily.   Yes Historical Provider, MD  furosemide (LASIX) 40 MG tablet Take 20-60 mg by mouth 2 (two) times daily. Pt takes 1.5 tabs (60mg ) in the morning and 1/2 tablet (20mg ) in the evening   Yes Historical Provider, MD  insulin NPH Human (HUMULIN N,NOVOLIN N) 100 UNIT/ML injection Inject into the skin.   Yes Historical Provider, MD  insulin NPH Human (HUMULIN N,NOVOLIN N) 100 UNIT/ML injection Inject 34-38 Units into the skin 2 (two) times daily. 38 in am and 34 at night 11/27/12  Yes Liam Graham, PA-C  losartan (COZAAR) 50 MG tablet Take 50 mg by mouth 2 (two) times daily.     Yes Historical Provider, MD  LUTEIN PO Take 1 tablet by mouth daily.    Yes Historical Provider, MD  metFORMIN (GLUCOPHAGE) 500 MG tablet  12/09/11  Yes Historical Provider, MD  metoprolol tartrate (LOPRESSOR) 25 MG tablet Take 25 mg by mouth 2 (two) times daily.    Yes Historical Provider, MD  Multiple Vitamin (MULTIVITAMIN) tablet Take 1 tablet by mouth daily.   Yes Historical Provider, MD   Physical Exam: Filed Vitals:   08/23/13 1647  BP: 158/70  Pulse: 50  Temp:   Resp:     BP 158/70  Pulse 50  Temp(Src) 98 F (36.7 C) (Oral)  Resp 18  SpO2 94%  General:  Appears calm and comfortable Eyes: PERRL, normal lids, irises & conjunctiva ENT: grossly normal hearing, lips & tongue Neck: no LAD, masses or thyromegaly Cardiovascular: RRR, no  m/r/g. No LE edema. Telemetry: SR, no arrhythmias  Respiratory: CTA bilaterally, no w/r/r. Normal respiratory effort. Abdomen: soft, ntnd Skin: Erythema involving right great toe with presence of bullae  appearing hemorrhagic, associated swelling Musculoskeletal: grossly normal tone BUE/BLE, pedal pulses palpable bilaterally Psychiatric: grossly normal mood and affect, speech fluent and appropriate Neurologic: grossly non-focal.          Labs on Admission:  Basic Metabolic Panel:  Recent Labs Lab 08/23/13 1417  NA 138  K 3.7  CL 100  CO2 25  GLUCOSE 241*  BUN 20  CREATININE 0.72  CALCIUM 9.5   Liver Function Tests:  Recent Labs Lab 08/23/13 1417  AST 50*  ALT 39*  ALKPHOS 113  BILITOT 0.6  PROT 7.9  ALBUMIN 3.2*   No results found for this basename: LIPASE, AMYLASE,  in the last 168 hours No results found for this basename: AMMONIA,  in the last 168 hours CBC:  Recent Labs Lab 08/23/13 1417  WBC 7.6  NEUTROABS 4.9  HGB 13.9  HCT 39.6  MCV 93.0  PLT 234   Cardiac Enzymes: No results found for this basename: CKTOTAL, CKMB, CKMBINDEX, TROPONINI,  in the last 168 hours  BNP (last 3 results) No results found for this basename: PROBNP,  in the last 8760 hours CBG: No results found for this basename: GLUCAP,  in the last 168 hours  Radiological Exams on Admission: Dg Foot Complete Right  08/23/2013   CLINICAL DATA:  Redness and cellulitis.  Oozing at the great toe.  EXAM: RIGHT FOOT COMPLETE - 3+ VIEW  COMPARISON:  05/26/2006  FINDINGS: Right foot has diffuse osteopenia. Alignment of the foot is within normal limits. Negative for an acute fracture. There is no evidence for bone erosions or cortical destruction. Spurring at the calcaneus. Degenerative changes along the dorsal aspect of the midfoot.  IMPRESSION: No acute bone abnormality to the right foot.  Diffuse osteopenia.  No specific findings to suggest bone destruction or osteomyelitis.   Electronically  Signed   By: Markus Daft M.D.   On: 08/23/2013 15:14    EKG: Independently reviewed.   Assessment/Plan Principal Problem:   Cellulitis of great toe, right Active Problems:   DM (diabetes mellitus)   CAD (coronary artery disease)   Paroxysmal atrial fibrillation   S/P CABG (coronary artery bypass graft)   History of GI bleed   Dyslipidemia   HTN (hypertension)   Cellulitis   1. Right toe cellulitis. Patient with history of insulin-dependent diabetes mellitus, presenting with pain, swelling, erythema involving right great toe spreading across her foot despite taking Augmentin since last Monday. I am concerned about the possibility of osteomyelitis although initial plain film did not show bony abnormalities. Will check an MR of right foot, start broad-spectrum empiric IV antibiotic therapy with Vancomycin and Zosyn and obtain blood cultures. She has a history of PAD, ABI's checked at Dr Lianne Moris office on 07/06/2013 showed Right 0.89 and Left 1.05. Pedal pulses palpable on right on initial exam. Patient denies intermittent claudication. I suspect this is secondary to underlying infectious process rather than critical ischemia, however with check an arterial duplex.  2. History of peripheral arterial disease, status post left common iliac artery stenting 2012, orbital atherectomy and angioplasty of left SFA 2013, last seen by her vascular surgeon Dr Bridgett Larsson on 07/06/13. At the time of her visit, she reported resolution of intermittent claudication, without evidence of critical limb ischemia, with ABI of R 0.89 L 1.05. I think current presenting likely reflects cellulitis, however will check a RLE arterial duplex for assessment of her peripheral atherosclerosis.   3. Insulin-dependent type 2 diabetes mellitus. Will continue home regimen of insulin  NPH 38 units in a.m. 34 units in p.m. Perform Accu-Cheks to a.c. each bedtime with sliding scale coverage. Will discontinue metformin therapy as she may require IV  contrast. 4. Coronary Artery Disease. Stable. She denies chest pain or shortness of breath. Will continue aspirin, beta blocker and statin  5. Hypertension. Will continue metoprolol 25 mg by mouth twice a day and Norvasc 5 mg by mouth daily 6. Dyslipidemia. Continue Lipitor 80 mg by mouth daily 7. DVT prophylaxis. Lovenox    Code Status: Full Code Family Communication: Spoke with patients daughter present at bedside Disposition Plan: Admit to inpatient service, I anticipate she will require greater than 2 nights hospitalizaiton  Time spent: 40 min  [facility] Pager [phone]

## 2013-08-23 NOTE — Progress Notes (Signed)
ANTIBIOTIC CONSULT NOTE - INITIAL  Pharmacy Consult for vancomycin Indication: cellulitis  Allergies  Allergen Reactions  . Morphine And Related Nausea And Vomiting    Chest pain    . Lisinopril Cough  . Sertraline Hcl     REACTION: nausea  . Zoloft [Sertraline Hcl]     Nausea     Patient Measurements:   Adjusted Body Weight:   Vital Signs: Temp: 98 F (36.7 C) (05/07 1251) Temp src: Oral (05/07 1251) BP: 151/53 mmHg (05/07 1700) Pulse Rate: 67 (05/07 1700) Intake/Output from previous day:   Intake/Output from this shift:    Labs:  Recent Labs  08/23/13 1417  WBC 7.6  HGB 13.9  PLT 234  CREATININE 0.72   The CrCl is unknown because both a height and weight (above a minimum accepted value) are required for this calculation. No results found for this basename: VANCOTROUGH, VANCOPEAK, VANCORANDOM, GENTTROUGH, GENTPEAK, GENTRANDOM, TOBRATROUGH, TOBRAPEAK, TOBRARND, AMIKACINPEAK, AMIKACINTROU, AMIKACIN,  in the last 72 hours   Microbiology: No results found for this or any previous visit (from the past 720 hour(s)).  Medical History: Past Medical History  Diagnosis Date  . CAD (coronary artery disease)   . Obesity   . Atrial fibrillation   . Hypertension   . Diabetes mellitus   . Hyperlipidemia   . Peripheral vascular disease   . GI bleed 1/11  . Carotid artery occlusion   . Normal nuclear stress test 03/18/2011  . Angina   . GERD (gastroesophageal reflux disease)   . Arthritis   . Fibromyalgia   . Myocardial infarction ~ 1991    Medications:  Anti-infectives   Start     Dose/Rate Route Frequency Ordered Stop   08/24/13 0600  vancomycin (VANCOCIN) IVPB 1000 mg/200 mL premix     1,000 mg 200 mL/hr over 60 Minutes Intravenous Every 12 hours 08/23/13 1722     08/23/13 1800  piperacillin-tazobactam (ZOSYN) IVPB 3.375 g     3.375 g 12.5 mL/hr over 240 Minutes Intravenous 4 times per day 08/23/13 1723     08/23/13 1615  vancomycin (VANCOCIN) IVPB  1000 mg/200 mL premix  Status:  Discontinued     1,000 mg 200 mL/hr over 60 Minutes Intravenous  Once 08/23/13 1610 08/23/13 1723   08/23/13 1615  piperacillin-tazobactam (ZOSYN) IVPB 3.375 g     3.375 g 12.5 mL/hr over 240 Minutes Intravenous  Once 08/23/13 1610 08/23/13 1713     Assessment: 73 yof presented to the ED with cellulitis. Pt is afebrile, WBC is WNL and renal fxn is good. First doses ordered in the ED.   Vanc 5/7>> Zosyn 5/7>>  Goal of Therapy:  Vancomycin trough level 10-15 mcg/ml  Plan:  1. Vancomycin 1gm IV Q12H 2. F/u renal fxn, C&S, clinical status and trough at [place] 08/23/2013,5:24 PM

## 2013-08-23 NOTE — ED Notes (Signed)
Pt  Reports     She  Was  Seen  ucc     3  Days   Ago  For  An  Infected    r  Big  Toe          She  Is  A  Diabetic     She  Was  Given  Anti biotic  Pills  But  Is  Not  Getting  Better

## 2013-08-23 NOTE — ED Notes (Signed)
hospitalist At bedside

## 2013-08-24 ENCOUNTER — Inpatient Hospital Stay (HOSPITAL_COMMUNITY): Payer: Medicare Other

## 2013-08-24 DIAGNOSIS — I739 Peripheral vascular disease, unspecified: Secondary | ICD-10-CM

## 2013-08-24 LAB — CBC
HCT: 38.8 % (ref 36.0–46.0)
HEMOGLOBIN: 13.2 g/dL (ref 12.0–15.0)
MCH: 31.8 pg (ref 26.0–34.0)
MCHC: 34 g/dL (ref 30.0–36.0)
MCV: 93.5 fL (ref 78.0–100.0)
Platelets: 236 10*3/uL (ref 150–400)
RBC: 4.15 MIL/uL (ref 3.87–5.11)
RDW: 12.6 % (ref 11.5–15.5)
WBC: 6.3 10*3/uL (ref 4.0–10.5)

## 2013-08-24 LAB — HEMOGLOBIN A1C
HEMOGLOBIN A1C: 11.1 % — AB (ref <5.7)
Mean Plasma Glucose: 272 mg/dL — ABNORMAL HIGH (ref <117)

## 2013-08-24 LAB — BASIC METABOLIC PANEL
BUN: 14 mg/dL (ref 6–23)
CHLORIDE: 105 meq/L (ref 96–112)
CO2: 26 mEq/L (ref 19–32)
CREATININE: 0.71 mg/dL (ref 0.50–1.10)
Calcium: 9.3 mg/dL (ref 8.4–10.5)
GFR calc Af Amer: >90 mL/min (ref >90)
GFR calc non Af Amer: 82 mL/min — ABNORMAL LOW (ref >90)
GLUCOSE: 90 mg/dL (ref 70–99)
POTASSIUM: 3.8 meq/L (ref 3.7–5.3)
Sodium: 142 mEq/L (ref 137–147)

## 2013-08-24 LAB — PROTIME-INR
INR: 1.07 (ref 0.00–1.49)
Prothrombin Time: 13.7 seconds (ref 11.6–15.2)

## 2013-08-24 LAB — GLUCOSE, CAPILLARY
GLUCOSE-CAPILLARY: 221 mg/dL — AB (ref 70–99)
Glucose-Capillary: 85 mg/dL (ref 70–99)
Glucose-Capillary: 212 mg/dL — ABNORMAL HIGH (ref 70–99)
Glucose-Capillary: 256 mg/dL — ABNORMAL HIGH (ref 70–99)

## 2013-08-24 MED ORDER — INSULIN NPH (HUMAN) (ISOPHANE) 100 UNIT/ML ~~LOC~~ SUSP
38.0000 [IU] | Freq: Every day | SUBCUTANEOUS | Status: DC
  Administered 2013-08-25 – 2013-08-27 (×3): 38 [IU] via SUBCUTANEOUS
  Filled 2013-08-24: qty 10

## 2013-08-24 MED ORDER — INSULIN NPH (HUMAN) (ISOPHANE) 100 UNIT/ML ~~LOC~~ SUSP
32.0000 [IU] | Freq: Every day | SUBCUTANEOUS | Status: DC
  Administered 2013-08-24 – 2013-08-26 (×3): 32 [IU] via SUBCUTANEOUS
  Filled 2013-08-24: qty 10

## 2013-08-24 MED ORDER — INSULIN NPH (HUMAN) (ISOPHANE) 100 UNIT/ML ~~LOC~~ SUSP
25.0000 [IU] | Freq: Every day | SUBCUTANEOUS | Status: DC
  Filled 2013-08-24: qty 10

## 2013-08-24 MED ORDER — INSULIN NPH (HUMAN) (ISOPHANE) 100 UNIT/ML ~~LOC~~ SUSP
30.0000 [IU] | Freq: Every day | SUBCUTANEOUS | Status: DC
  Administered 2013-08-24: 30 [IU] via SUBCUTANEOUS
  Filled 2013-08-24: qty 10

## 2013-08-24 NOTE — ED Provider Notes (Signed)
Medical screening examination/treatment/procedure(s) were performed by a resident physician or non-physician practitioner and as the supervising physician I was immediately available for consultation/collaboration.  Lynne Leader, MD    Gregor Hams, MD 08/24/13 [phone]

## 2013-08-24 NOTE — Progress Notes (Addendum)
[facility] PROGRESS NOTE  Kristina Summers [id number] DOB: 1936-01-28 DOA: 08/23/2013 PCP: [name], MD  Brief Summary  Kristina Summers is a 78 y.o. female with a past medical history of PAD (last ABI performed on 07/06/2013 showing right 0.89 and left 1.05), insulin-dependent type 2 diabetes mellitus, coronary artery disease, hypertension, dyslipidemia, history of GI bleed presented to the emergent apartment on 08/23/2048 with complaints of right great toe pain and swelling. She states that symptoms started approximately one week ago which have progressively worsened. She was seen at the urgent care Center last 1 day where she was diagnosed with cellulitis and given a prescription for Augmentin. Patient followed up today found to have progression of erythema and swelling and referred to the emergency department. Improving with vanc and zosyn.  Prelim arterial duplex is abnl >> follow up final report.     Assessment/Plan  Right toe cellulitis failed tx with augmentin, improved but still needs further IV antibiotics.  May be developing some abscessed area on top of toe that needs to be monitored. -  MRI neg for osteo -  Continue vancomycin and zosyn day 2 -  Blood cultures NGTD  PAD, status post left common iliac artery stenting 2012, orbital atherectomy and angioplasty of left SFA 2013, last seen by her vascular surgeon Dr Bridgett Larsson on 07/06/13. At the time of her visit, she reported resolution of intermittent claudication, without evidence of critical limb ischemia, with ABI of R 0.89 L 1.05.   -  Arterial duplex:  Abnormal prelim, f/u official report  Insulin-dependent type 2 diabetes mellitus.  AM CBG 85.  A1c 11.1 -  Initially reduced NPH to 30 units qAM but CBG were in 200s.  Will increase morning insulin back to 38 units -  Reduce QHS insulin from 34 units to 32 units -  Nutrition education -  Diabetes education -  If not able to get A1c down, may need to transition to combination long and  [name]-acting insulin  Coronary Artery Disease. Stable. -  continue aspirin, beta blocker and statin   Hypertension, blood pressure well controlled today -  Continue metoprolol and Norvasc 5 mg  Dyslipidemia, stable.  -  Continue Lipitor 80 mg by mouth daily   Diet:  Diabetic Access:  PIV IVF:  Off Proph:  Lovenox  Code Status: Full code Family Communication: patient alone Disposition Plan: pending improvement in foot infection   Consultants:  None  Procedures:  X-ray right foot  MRI right foot  Antibiotics:  Vancomycin from 5/7 >>>  Zosyn from 5/7 >>>   HPI/Subjective:  Foot is feeling better, less red and swollen.    Objective: Filed Vitals:   08/23/13 1700 08/23/13 1749 08/23/13 2118 08/24/13 0518  BP: 151/53 162/57 118/95 136/60  Pulse: 67 66 74 82  Temp:  97.5 F (36.4 C) 98 F (36.7 C) 97.4 F (36.3 C)  TempSrc:  Oral Oral Oral  Resp:  18 18 20   SpO2: 95% 97% 93% 95%    Intake/Output Summary (Last 24 hours) at 08/24/13 1215 Last data filed at 08/24/13 0807  Gross per 24 hour  Intake    720 ml  Output      0 ml  Net    720 ml   There were no vitals filed for this visit.  Exam:   General:  CF, No acute distress  HEENT:  NCAT, MMM  Cardiovascular:  RRR, nl S1, S2 , 2/6 systolic murmur LSB, 2+ pulses, warm extremities  Respiratory:  CTAB, no increased WOB  Abdomen:   NABS, soft, NT/ND  MSK:   Normal tone and bulk, no LEE, right forefoot with minimal swelling, dark erythema within the drawn line, two 1cm blood bullae on base of 1st toe and some purulent streaking beneath the medial part of the toenail bed without clear line of demarcation.    Neuro:  Grossly intact  Data Reviewed: Basic Metabolic Panel:  Recent Labs Lab 09-09-13 1417 09-Sep-2013 1910 08/24/13 0500  NA 138  --  142  K 3.7  --  3.8  CL 100  --  105  CO2 25  --  26  GLUCOSE 241*  --  90  BUN 20  --  14  CREATININE 0.72 0.69 0.71  CALCIUM 9.5  --  9.3   Liver  Function Tests:  Recent Labs Lab 09-09-13 1417  AST 50*  ALT 39*  ALKPHOS 113  BILITOT 0.6  PROT 7.9  ALBUMIN 3.2*   No results found for this basename: LIPASE, AMYLASE,  in the last 168 hours No results found for this basename: AMMONIA,  in the last 168 hours CBC:  Recent Labs Lab 09/09/13 1417 09-Sep-2013 1910 08/24/13 0500  WBC 7.6 7.4 6.3  NEUTROABS 4.9  --   --   HGB 13.9 14.4 13.2  HCT 39.6 41.1 38.8  MCV 93.0 92.8 93.5  PLT 234 255 236   Cardiac Enzymes: No results found for this basename: CKTOTAL, CKMB, CKMBINDEX, TROPONINI,  in the last 168 hours BNP (last 3 results) No results found for this basename: PROBNP,  in the last 8760 hours CBG:  Recent Labs Lab Sep 09, 2013 1759 09/09/2013 2120 08/24/13 0630 08/24/13 1151  GLUCAP 163* 247* 85 212*    No results found for this or any previous visit (from the past 240 hour(s)).   Studies: Dg Foot Complete Right  09-Sep-2013   CLINICAL DATA:  Redness and cellulitis.  Oozing at the great toe.  EXAM: RIGHT FOOT COMPLETE - 3+ VIEW  COMPARISON:  05/26/2006  FINDINGS: Right foot has diffuse osteopenia. Alignment of the foot is within normal limits. Negative for an acute fracture. There is no evidence for bone erosions or cortical destruction. Spurring at the calcaneus. Degenerative changes along the dorsal aspect of the midfoot.  IMPRESSION: No acute bone abnormality to the right foot.  Diffuse osteopenia.  No specific findings to suggest bone destruction or osteomyelitis.   Electronically Signed   By: Markus Daft M.D.   On: 2013/09/09 15:14    Scheduled Meds: . amLODipine  5 mg Oral Daily  . aspirin  81 mg Oral Daily  . atorvastatin  80 mg Oral q1800  . docusate sodium  100 mg Oral BID  . enoxaparin (LOVENOX) injection  40 mg Subcutaneous Q24H  . furosemide  60 mg Oral Daily  . insulin aspart  0-15 Units Subcutaneous TID WC  . insulin aspart  0-5 Units Subcutaneous QHS  . insulin NPH Human  25 Units Subcutaneous QHS  .  insulin NPH Human  30 Units Subcutaneous QAC breakfast  . losartan  50 mg Oral BID  . metoprolol tartrate  25 mg Oral BID  . piperacillin-tazobactam (ZOSYN)  IV  3.375 g Intravenous Q8H  . sodium chloride  3 mL Intravenous Q12H  . vancomycin  1,000 mg Intravenous Q12H   Continuous Infusions:   Principal Problem:   Cellulitis of great toe, right Active Problems:   CAD (coronary artery disease)   Paroxysmal atrial fibrillation  S/P CABG (coronary artery bypass graft)   History of GI bleed   DM (diabetes mellitus)   Dyslipidemia   HTN (hypertension)   Cellulitis    Time spent: 30 min    [place] [facility] Pager [phone]. If 7PM-7AM, please contact night-coverage at [email or web address], password [facility] 08/24/2013, 12:15 PM  LOS: 1 day

## 2013-08-24 NOTE — Progress Notes (Signed)
VASCULAR LAB PRELIMINARY  PRELIMINARY  PRELIMINARY  PRELIMINARY  Right lower extremity arterial duplex completed.    Preliminary report:  Dup[lex scan revealed moderate to severe irregular calcific plaque throughout the right thigh and popliteal fossa. There is mild heterogeneous plaque noted in the tibial vessels. Noted are several areas of 20% to 49% stenosis in the thigh and popliteal regions with a 50% to 99% stenosis in the min superficial femoral artery. Doppler waveforms are biphasic throughout the tight and popliteal region. Monophasic Doppler signals are noted in the tibial vessels.  [place], RVS 08/24/2013, 5:14 PM

## 2013-08-25 DIAGNOSIS — I4891 Unspecified atrial fibrillation: Secondary | ICD-10-CM

## 2013-08-25 DIAGNOSIS — I70219 Atherosclerosis of native arteries of extremities with intermittent claudication, unspecified extremity: Secondary | ICD-10-CM

## 2013-08-25 LAB — GLUCOSE, CAPILLARY
GLUCOSE-CAPILLARY: 94 mg/dL (ref 70–99)
Glucose-Capillary: 124 mg/dL — ABNORMAL HIGH (ref 70–99)
Glucose-Capillary: 256 mg/dL — ABNORMAL HIGH (ref 70–99)
Glucose-Capillary: 118 mg/dL — ABNORMAL HIGH (ref 70–99)

## 2013-08-25 MED ORDER — SULFAMETHOXAZOLE-TMP DS 800-160 MG PO TABS
1.0000 | ORAL_TABLET | Freq: Two times a day (BID) | ORAL | Status: DC
  Administered 2013-08-25 – 2013-08-27 (×5): 1 via ORAL
  Filled 2013-08-25 (×6): qty 1

## 2013-08-25 NOTE — Plan of Care (Signed)
Problem: Food- and Nutrition-Related Knowledge Deficit (NB-1.1) Goal: Nutrition education Formal process to instruct or train a patient/client in a skill or to impart knowledge to help patients/clients voluntarily manage or modify food choices and eating behavior to maintain or improve health. Outcome: Completed/Met Date Met:  08/25/13  RD consulted for nutrition education regarding diabetes.     Lab Results  Component Value Date    HGBA1C 11.1* 08/23/2013    RD provided "Carbohydrate Counting for People with Diabetes" handout from the Academy of [employer] and Dietetics. Discussed different food groups and their effects on blood sugar, emphasizing carbohydrate-containing foods. Provided list of carbohydrates and recommended serving sizes of common foods.  Discussed importance of controlled and consistent carbohydrate intake throughout the day. Provided examples of ways to balance meals/snacks and encouraged intake of high-fiber, whole grain complex carbohydrates. Teach back method used.  Expect fair compliance. Pt has seen an outpatient RD previously for heart healthy diet, and had been trying to incorporate smaller portion sizes with heart healthy food items. Diet recall indicates pt consumes large amount of starchy foods-rice, potatoes, toast. Encouraged increasing intake of fiber foods. Utilized MyPlate method to demonstrate portion control and emphasis balanced meals. Promoted larger intake of non-starchy vegetables. Pt had impaired eyesight, but has machine at home that allows pt to read education materials provided. Discussed outpatient RD services at [facility]; however pt is unable to afford outpatient RD counseling.  Body mass index is 32.31 kg/(m^2). Pt meets criteria for Obesity I based on current BMI.  Current diet order is Carb Modified, patient is consuming approximately >75% of meals at this time. Labs and medications reviewed. No further nutrition interventions warranted at this time. RD  contact information provided. If additional nutrition issues arise, please re-consult RD.  Kristina Abide MS RD LDN Clinical Dietitian [email or web address]:[phone]

## 2013-08-25 NOTE — Progress Notes (Signed)
Patient ID: Kristina Summers, female   DOB: 1935/06/16, 78 y.o.   MRN: [id number]  [facility] PROGRESS NOTE  Adalynd Beauchaine [id number] DOB: [birth year] DOA: 08/23/2013 PCP: [name], MD  Brief narrative: 78 y.o. female with PAD (last ABI performed on 07/06/2013 showing right 0.89 and left 1.05), insulin-dependent DM, CAD, HTN, HLD, history of GI bleed presented to [facility] ED with right great toe pain and swelling that started approximately one week PTA. She was seen at the urgent care center where she was diagnosed with cellulitis and given a prescription for Augmentin. Her symptoms have not gotten better.   Assessment/Plan  Right toe cellulitis  - failed tx with Augmentin - pt clinically improving and cellulitis nearly resolved, small areas of swelling on the right great toe are blisters  - MRI neg for osteo  - Continue vancomycin and zosyn day 3 and transition to oral ABX today, possible d/c in AM - Blood cultures NGTD  PAD, status post left common iliac artery stenting 2012  - ngioplasty of left SFA 2013, last seen by her vascular surgeon Dr Bridgett Larsson on 07/06/13. - Arterial duplex: Abnormal prelim, f/u official report  Insulin-dependent type 2 diabetes mellitus.  - A1c 11.1  - continue Insulin BID as noted below  - Nutrition education  - Diabetes education  Coronary Artery Disease  - continue aspirin, beta blocker and statin  Hypertension - blood pressure well controlled - Continue metoprolol and Norvasc, losartan  Dyslipidemia - Continue Lipitor 80 mg by mouth daily   Diet: Diabetic  Access: PIV  IVF: Off  Proph: Lovenox   Code Status: Full code  Family Communication: patient alone  Disposition Plan: possibly in am   Consultants:  None Antibiotics:  Vancomycin from 5/7 >>>  Zosyn from 5/7 >>>  Procedures/Studies:  Mr Foot Right Wo Contrast  08/24/2013   Subcutaneous blistering over the dorsum of the great toe. Negative for abscess or osteomyelitis.     Dg Foot Right  08/23/2013   No acute bone abnormality to the right foot.  Diffuse osteopenia.  No specific findings to suggest bone destruction or osteomyelitis.    HPI/Subjective: No events overnight.   Objective: Filed Vitals:   08/24/13 1530 08/24/13 2045 08/25/13 0536 08/25/13 1044  BP: 130/67 137/59 122/50 133/60  Pulse: 74 78 69 75  Temp: 97.4 F (36.3 C) 97.9 F (36.6 C) 98.2 F (36.8 C) 97.6 F (36.4 C)  TempSrc: Oral Oral Oral Oral  Resp: 18 18 18 18   Height:      Weight:      SpO2: 96% 93% 93% 95%    Intake/Output Summary (Last 24 hours) at 08/25/13 1107 Last data filed at 08/25/13 0824  Gross per 24 hour  Intake   1610 ml  Output      0 ml  Net   1610 ml    Exam:   General:  Pt is alert, follows commands appropriately, not in acute distress  Cardiovascular: Regular rate and rhythm, S1/S2, no murmurs, no rubs, no gallops  Respiratory: Clear to auscultation bilaterally, no wheezing, no crackles, no rhonchi  Abdomen: Soft, non tender, non distended, bowel sounds present, no guarding  Extremities: Pulses DP and PT palpable bilaterally, right foot cellulitis improving over 75%, no TTP and very little erythema, 2 blisters on the right great toe 1 cm in diameter    Neuro: Grossly nonfocal  Data Reviewed: Basic Metabolic Panel:  Recent Labs Lab 08/23/13 1417 08/23/13 1910 08/24/13 0500  NA 138  --  142  K 3.7  --  3.8  CL 100  --  105  CO2 25  --  26  GLUCOSE 241*  --  90  BUN 20  --  14  CREATININE 0.72 0.69 0.71  CALCIUM 9.5  --  9.3   Liver Function Tests:  Recent Labs Lab 08/23/13 1417  AST 50*  ALT 39*  ALKPHOS 113  BILITOT 0.6  PROT 7.9  ALBUMIN 3.2*   CBC:  Recent Labs Lab 08/23/13 1417 08/23/13 1910 08/24/13 0500  WBC 7.6 7.4 6.3  NEUTROABS 4.9  --   --   HGB 13.9 14.4 13.2  HCT 39.6 41.1 38.8  MCV 93.0 92.8 93.5  PLT 234 255 236   CBG:  Recent Labs Lab 08/24/13 0630 08/24/13 1151 08/24/13 1644 08/24/13 2119 08/25/13 0624  GLUCAP 85  212* 221* 256* 124*    Recent Results (from the past 240 hour(s))  CULTURE, BLOOD (ROUTINE X 2)     Status: None   Collection Time    08/23/13  7:10 PM      Result Value Ref Range Status   Specimen Description BLOOD LEFT ARM   Final   Special Requests BOTTLES DRAWN AEROBIC AND ANAEROBIC 5CC    Final   Culture  Setup Time     Final   Value: 08/24/2013 01:48     Performed at [facility]   Culture     Final   Value:        BLOOD CULTURE RECEIVED NO GROWTH TO DATE CULTURE WILL BE HELD FOR 5 DAYS BEFORE ISSUING A FINAL NEGATIVE REPORT     Performed at [facility]   Report Status PENDING   Incomplete  CULTURE, BLOOD (ROUTINE X 2)     Status: None   Collection Time    08/23/13  7:13 PM      Result Value Ref Range Status   Specimen Description BLOOD RIGHT HAND   Final   Special Requests     Final   Value: BOTTLES DRAWN AEROBIC AND ANAEROBIC 5CC AER,6CC ANA   Culture  Setup Time     Final   Value: 08/24/2013 01:47     Performed at [facility]   Culture     Final   Value:        BLOOD CULTURE RECEIVED NO GROWTH TO DATE CULTURE WILL BE HELD FOR 5 DAYS BEFORE ISSUING A FINAL NEGATIVE REPORT     Performed at [facility]   Report Status PENDING   Incomplete     Scheduled Meds: . amLODipine  5 mg Oral Daily  . aspirin  81 mg Oral Daily  . atorvastatin  80 mg Oral q1800  . docusate sodium  100 mg Oral BID  . enoxaparin (LOVENOX) injection  40 mg Subcutaneous Q24H  . furosemide  60 mg Oral Daily  . insulin aspart  0-15 Units Subcutaneous TID WC  . insulin aspart  0-5 Units Subcutaneous QHS  . insulin NPH Human  32 Units Subcutaneous QHS  . insulin NPH Human  38 Units Subcutaneous QAC breakfast  . losartan  50 mg Oral BID  . metoprolol tartrate  25 mg Oral BID  . piperacillin-tazobactam (ZOSYN)  IV  3.375 g Intravenous Q8H  . sodium chloride  3 mL Intravenous Q12H  . vancomycin  1,000 mg Intravenous Q12H   Continuous Infusions:   Theodis Blaze,  MD  [facility] Pager [phone]  If 7PM-7AM, please contact  night-coverage [email or web address] Password [email or web address] 08/25/2013, 11:07 AM   LOS: 2 days

## 2013-08-26 LAB — CBC
HEMATOCRIT: 40.6 % (ref 36.0–46.0)
HEMOGLOBIN: 13.8 g/dL (ref 12.0–15.0)
MCH: 32.3 pg (ref 26.0–34.0)
MCHC: 34 g/dL (ref 30.0–36.0)
MCV: 95.1 fL (ref 78.0–100.0)
Platelets: 248 10*3/uL (ref 150–400)
RBC: 4.27 MIL/uL (ref 3.87–5.11)
RDW: 12.7 % (ref 11.5–15.5)
WBC: 7.3 10*3/uL (ref 4.0–10.5)

## 2013-08-26 LAB — GLUCOSE, CAPILLARY
GLUCOSE-CAPILLARY: 105 mg/dL — AB (ref 70–99)
GLUCOSE-CAPILLARY: 339 mg/dL — AB (ref 70–99)
GLUCOSE-CAPILLARY: 197 mg/dL — AB (ref 70–99)
Glucose-Capillary: 200 mg/dL — ABNORMAL HIGH (ref 70–99)

## 2013-08-26 LAB — BASIC METABOLIC PANEL
BUN: 19 mg/dL (ref 6–23)
CHLORIDE: 104 meq/L (ref 96–112)
CO2: 27 mEq/L (ref 19–32)
Calcium: 9.5 mg/dL (ref 8.4–10.5)
Creatinine, Ser: 0.87 mg/dL (ref 0.50–1.10)
GFR calc Af Amer: 74 mL/min — ABNORMAL LOW (ref >90)
GFR calc non Af Amer: 64 mL/min — ABNORMAL LOW (ref >90)
Glucose, Bld: 78 mg/dL (ref 70–99)
POTASSIUM: 3.8 meq/L (ref 3.7–5.3)
Sodium: 140 mEq/L (ref 137–147)

## 2013-08-26 NOTE — Progress Notes (Signed)
Patient ID: Kristina Summers, female   DOB: 1935-07-18, 78 y.o.   MRN: [id number]  [facility] PROGRESS NOTE  Kristina Summers [id number] DOB: 03/17/1936 DOA: 08/23/2013 PCP: [name], MD  Brief narrative:  78 y.o. female with PAD (last ABI performed on 07/06/2013 showing right 0.89 and left 1.05), insulin-dependent DM, CAD, HTN, HLD, history of GI bleed presented to [facility] ED with right great toe pain and swelling that started approximately one week PTA. She was seen at the urgent care center where she was diagnosed with cellulitis and given a prescription for Augmentin. Her symptoms have not gotten better.   Assessment/Plan  Right toe cellulitis  - failed tx with Augmentin  - pt clinically improving and cellulitis nearly resolved, small areas of swelling on the right great toe are blisters  - MRI neg for osteo  - Continued vancomycin and zosyn for 3 days and transitioned to oral ABX 05/09 - since no significant change in cellulitis since transitioning to oral ABX, observe for 24 more hours, decide if pt stable for d/c in AM - Blood cultures NGTD  PAD, status post left common iliac artery stenting 2012  - ngioplasty of left SFA 2013, last seen by her vascular surgeon Dr Bridgett Larsson on 07/06/13.  - Arterial duplex: Abnormal prelim, f/u official report  Insulin-dependent type 2 diabetes mellitus.  - A1c 11.1  - continue Insulin BID as noted below  - Nutrition education  - Diabetes education  Coronary Artery Disease  - continue aspirin, beta blocker and statin  Hypertension  - blood pressure well controlled  - Continue metoprolol and Norvasc, losartan  Dyslipidemia  - Continue Lipitor 80 mg by mouth daily   Diet: Diabetic  Access: PIV  IVF: Off  Proph: Lovenox   Code Status: Full code  Family Communication: patient alone  Disposition Plan: possibly in am   Consultants:   None Antibiotics:   Vancomycin from 5/7 >>>   Zosyn from 5/7 >>>  Procedures/Studies:   Mr Foot Right Wo  Contrast 08/24/2013 Subcutaneous blistering over the dorsum of the great toe. Negative for abscess or osteomyelitis.   Dg Foot Right 08/23/2013 No acute bone abnormality to the right foot. Diffuse osteopenia. No specific findings to suggest bone destruction or osteomyelitis.     HPI/Subjective: No events overnight.   Objective: Filed Vitals:   08/25/13 1559 08/25/13 2138 08/26/13 0618 08/26/13 1044  BP: 130/48 125/48 133/52 155/57  Pulse: 66 82 80 84  Temp: 97.9 F (36.6 C) 97.9 F (36.6 C) 97.9 F (36.6 C)   TempSrc: Oral Oral Oral   Resp: 18 18 18    Height:      Weight:      SpO2: 97% 96% 95%     Intake/Output Summary (Last 24 hours) at 08/26/13 1252 Last data filed at 08/26/13 1044  Gross per 24 hour  Intake    486 ml  Output      0 ml  Net    486 ml    Exam:   General:  Pt is alert, follows commands appropriately, not in acute distress  Cardiovascular: Regular rate and rhythm, S1/S2, no murmurs, no rubs, no gallops  Respiratory: Clear to auscultation bilaterally, no wheezing, no crackles, no rhonchi  Abdomen: Soft, non tender, non distended, bowel sounds present, no guarding  Extremities: No significant change in right foot cellulitis over the past 24 hours  Neuro: Grossly nonfocal  Data Reviewed: Basic Metabolic Panel:  Recent Labs Lab 08/23/13 1417 08/23/13 1910 08/24/13 0500  08/26/13 0350  NA 138  --  142 140  K 3.7  --  3.8 3.8  CL 100  --  105 104  CO2 25  --  26 27  GLUCOSE 241*  --  90 78  BUN 20  --  14 19  CREATININE 0.72 0.69 0.71 0.87  CALCIUM 9.5  --  9.3 9.5   Liver Function Tests:  Recent Labs Lab 08/23/13 1417  AST 50*  ALT 39*  ALKPHOS 113  BILITOT 0.6  PROT 7.9  ALBUMIN 3.2*    CBC:  Recent Labs Lab 08/23/13 1417 08/23/13 1910 08/24/13 0500 08/26/13 0350  WBC 7.6 7.4 6.3 7.3  NEUTROABS 4.9  --   --   --   HGB 13.9 14.4 13.2 13.8  HCT 39.6 41.1 38.8 40.6  MCV 93.0 92.8 93.5 95.1  PLT 234 255 236 248    CBG:  Recent Labs Lab 08/25/13 1134 08/25/13 1613 08/25/13 2140 08/26/13 0659 08/26/13 1138  GLUCAP 256* 94 118* 105* 200*    Recent Results (from the past 240 hour(s))  CULTURE, BLOOD (ROUTINE X 2)     Status: None   Collection Time    08/23/13  7:10 PM      Result Value Ref Range Status   Specimen Description BLOOD LEFT ARM   Final   Special Requests BOTTLES DRAWN AEROBIC AND ANAEROBIC 5CC    Final   Culture  Setup Time     Final   Value: 08/24/2013 01:48     Performed at [facility]   Culture     Final   Value:        BLOOD CULTURE RECEIVED NO GROWTH TO DATE CULTURE WILL BE HELD FOR 5 DAYS BEFORE ISSUING A FINAL NEGATIVE REPORT     Performed at [facility]   Report Status PENDING   Incomplete  CULTURE, BLOOD (ROUTINE X 2)     Status: None   Collection Time    08/23/13  7:13 PM      Result Value Ref Range Status   Specimen Description BLOOD RIGHT HAND   Final   Special Requests     Final   Value: BOTTLES DRAWN AEROBIC AND ANAEROBIC 5CC AER,6CC ANA   Culture  Setup Time     Final   Value: 08/24/2013 01:47     Performed at [facility]   Culture     Final   Value:        BLOOD CULTURE RECEIVED NO GROWTH TO DATE CULTURE WILL BE HELD FOR 5 DAYS BEFORE ISSUING A FINAL NEGATIVE REPORT     Performed at [facility]   Report Status PENDING   Incomplete     Scheduled Meds: . amLODipine  5 mg Oral Daily  . aspirin  81 mg Oral Daily  . atorvastatin  80 mg Oral q1800  . docusate sodium  100 mg Oral BID  . enoxaparin (LOVENOX) injection  40 mg Subcutaneous Q24H  . furosemide  60 mg Oral Daily  . insulin aspart  0-15 Units Subcutaneous TID WC  . insulin aspart  0-5 Units Subcutaneous QHS  . insulin NPH Human  32 Units Subcutaneous QHS  . insulin NPH Human  38 Units Subcutaneous QAC breakfast  . losartan  50 mg Oral BID  . metoprolol tartrate  25 mg Oral BID  . sodium chloride  3 mL Intravenous Q12H  . sulfamethoxazole-trimethoprim  1  tablet Oral Q12H   Continuous  Infusions:    Theodis Blaze, MD  [facility] Pager [phone]  If 7PM-7AM, please contact night-coverage [email or web address] Password [email or web address] 08/26/2013, 12:52 PM   LOS: 3 days

## 2013-08-27 LAB — GLUCOSE, CAPILLARY: GLUCOSE-CAPILLARY: 106 mg/dL — AB (ref 70–99)

## 2013-08-27 MED ORDER — ACETAMINOPHEN 325 MG PO TABS: 650.0000 mg | ORAL_TABLET | Freq: Four times a day (QID) | ORAL | Status: DC | PRN

## 2013-08-27 MED ORDER — SULFAMETHOXAZOLE-TMP DS 800-160 MG PO TABS: 1.0000 | ORAL_TABLET | Freq: Two times a day (BID) | ORAL | Status: DC

## 2013-08-27 NOTE — Discharge Instructions (Signed)
Cellulitis Cellulitis is an infection of the skin and the tissue beneath it. The infected area is usually red and tender. Cellulitis occurs most often in the arms and lower legs.  CAUSES  Cellulitis is caused by bacteria that enter the skin through cracks or cuts in the skin. The most common types of bacteria that cause cellulitis are Staphylococcus and Streptococcus. SYMPTOMS   Redness and warmth.  Swelling.  Tenderness or pain.  Fever. DIAGNOSIS  Your caregiver can usually determine what is wrong based on a physical exam. Blood tests may also be done. TREATMENT  Treatment usually involves taking an antibiotic medicine. HOME CARE INSTRUCTIONS   Take your antibiotics as directed. Finish them even if you start to feel better.  Keep the infected arm or leg elevated to reduce swelling.  Apply a warm cloth to the affected area up to 4 times per day to relieve pain.  Only take over-the-counter or prescription medicines for pain, discomfort, or fever as directed by your caregiver.  Keep all follow-up appointments as directed by your caregiver. SEEK MEDICAL CARE IF:   You notice red streaks coming from the infected area.  Your red area gets larger or turns dark in color.  Your bone or joint underneath the infected area becomes painful after the skin has healed.  Your infection returns in the same area or another area.  You notice a swollen bump in the infected area.  You develop new symptoms. SEEK IMMEDIATE MEDICAL CARE IF:   You have a fever.  You feel very sleepy.  You develop vomiting or diarrhea.  You have a general ill feeling (malaise) with muscle aches and pains. MAKE SURE YOU:   Understand these instructions.  Will watch your condition.  Will get help right away if you are not doing well or get worse. Document Released: 01/13/2005 Document Revised: 10/05/2011 Document Reviewed: 06/21/2011 [employer] Patient Information 2014 [employer].  

## 2013-08-27 NOTE — Discharge Summary (Signed)
Physician Discharge Summary  Kristina Summers [id number] DOB: 07-Dec-1935 DOA: 08/23/2013  PCP: [name], MD  Admit date: 08/23/2013 Discharge date: 08/27/2013  Recommendations for Outpatient Follow-up:  1. Pt will need to follow up with PCP in 2-3 weeks post discharge 2. Please obtain BMP to evaluate electrolytes and kidney function 3. Please also check CBC to evaluate Hg and Hct levels 4. Bactrim for 10 more days post discharge   Discharge Diagnoses: Cellulitis of the right great toe  Principal Problem:   Cellulitis of great toe, right Active Problems:   CAD (coronary artery disease)   Paroxysmal atrial fibrillation   S/P CABG (coronary artery bypass graft)   History of GI bleed   DM (diabetes mellitus)   Dyslipidemia   HTN (hypertension)   Cellulitis  Discharge Condition: Stable  Diet recommendation: Heart healthy diet discussed in details   Brief narrative:  78 y.o. female with PAD (last ABI performed on 07/06/2013 showing right 0.89 and left 1.05), insulin-dependent DM, CAD, HTN, HLD, history of GI bleed presented to [facility] ED with right great toe pain and swelling that started approximately one week PTA. She was seen at the urgent care center where she was diagnosed with cellulitis and given a prescription for Augmentin. Her symptoms have not gotten better.   Assessment/Plan  Right toe cellulitis  - failed tx with Augmentin  - pt clinically improving and cellulitis nearly resolved, small areas of swelling on the right great toe are blisters  - MRI neg for osteo  - Continued vancomycin and zosyn for 3 days and transitioned to oral ABX 05/09  - Blood cultures NGTD  - pt will continue Bactrim upon discharge for 10 more days post discharge  PAD, status post left common iliac artery stenting 2012  - ngioplasty of left SFA 2013, last seen by her vascular surgeon Dr Bridgett Larsson on 07/06/13.  - Arterial duplex: Abnormal prelim, f/u official report  Insulin-dependent type 2 diabetes mellitus.   - A1c 11.1  - continue Insulin BID as noted below  - Nutrition education appreciated  - Diabetes education appreciated  Coronary Artery Disease  - continue aspirin, beta blocker and statin  Hypertension  - blood pressure well controlled  - Continue metoprolol and Norvasc, losartan  Dyslipidemia  - Continue Lipitor 80 mg by mouth daily   Diet: Diabetic  Access: PIV  IVF: Off  Proph: Lovenox   Code Status: Full code  Family Communication: patient alone   Consultants:  None Antibiotics:  Vancomycin from 5/7 >>> 5/10 Zosyn from 5/7 >>> 5/10 Bactrim 5/10 >>> 10 more days post discharge  Procedures/Studies:  Mr Foot Right Wo Contrast 08/24/2013 Subcutaneous blistering over the dorsum of the great toe. Negative for abscess or osteomyelitis.  Dg Foot Right 08/23/2013 No acute bone abnormality to the right foot. Diffuse osteopenia. No specific findings to suggest bone destruction or osteomyelitis.   Discharge Exam: Filed Vitals:   08/27/13 0613  BP: 123/44  Pulse: 72  Temp: 98.3 F (36.8 C)  Resp: 16   Filed Vitals:   08/26/13 1044 08/26/13 1601 08/26/13 2207 08/27/13 0613  BP: 155/57 137/59 140/83 123/44  Pulse: 84 76 94 72  Temp:  97.7 F (36.5 C) 98.5 F (36.9 C) 98.3 F (36.8 C)  TempSrc:  Oral Oral Oral  Resp:  16 16 16   Height:      Weight:      SpO2:  97% 98% 98%    General: Pt is alert, follows commands appropriately, not in  acute distress Cardiovascular: Regular rate and rhythm, S1/S2 +, no murmurs, no rubs, no gallops Respiratory: Clear to auscultation bilaterally, no wheezing, no crackles, no rhonchi Abdominal: Soft, non tender, non distended, bowel sounds +, no guarding Extremities: no edema, no cyanosis, pulses palpable bilaterally DP and PT Neuro: Grossly nonfocal  Discharge Instructions  Discharge Orders   Future Appointments Provider Department Dept Phone   01/11/2014 9:00 AM [facility] [address] [phone]   Eat  a light meal the night before the exam. Nothing to eat or drink for at least 8 hours before exam. No gum chewing, or smoking the morning of the exam. Please take your morning medications with small sips of water, especially blood pressure medication *Very Important* Please wear 2 piece clothing   01/11/2014 10:00 AM [facility] [place] [facility] [address] [id number]   01/11/2014 10:30 AM [facility] [place] [facility] [address] [id number]   01/11/2014 11:00 AM Sharmon Leyden Nickel, NP [facility] [place] [phone]   Future Orders Complete By Expires   Diet - low sodium heart healthy  As directed    Increase activity slowly  As directed        Medication List    STOP taking these medications       amoxicillin-clavulanate 875-125 MG per tablet  Commonly known as:  AUGMENTIN      TAKE these medications       acetaminophen 325 MG tablet  Commonly known as:  TYLENOL  Take 2 tablets (650 mg total) by mouth every 6 (six) hours as needed for mild pain (or Fever >/= 101).     amLODipine 5 MG tablet  Commonly known as:  NORVASC  Take 1 tablet by mouth daily.     aspirin 81 MG tablet  Take 81 mg by mouth daily.     atorvastatin 80 MG tablet  Commonly known as:  LIPITOR  Take 1 tablet (80 mg total) by mouth daily.     CALCIUM PLUS VITAMIN D PO  Take 1 tablet by mouth daily.     furosemide 40 MG tablet  Commonly known as:  LASIX  Take 20-60 mg by mouth 2 (two) times daily. Pt takes 1.5 tabs (60mg ) in the morning and 1/2 tablet (20mg ) in the evening     insulin NPH Human 100 UNIT/ML injection  Commonly known as:  HUMULIN N,NOVOLIN N  Inject into the skin.     insulin NPH Human 100 UNIT/ML injection  Commonly known as:  HUMULIN N,NOVOLIN N  Inject 34-38 Units into the skin 2 (two) times daily. 38 in am and 34 at night     losartan 50 MG tablet  Commonly known as:  COZAAR  Take 50 mg by mouth 2 (two) times daily.     LUTEIN PO   Take 1 tablet by mouth daily.     metFORMIN 500 MG tablet  Commonly known as:  GLUCOPHAGE     metoprolol tartrate 25 MG tablet  Commonly known as:  LOPRESSOR  Take 25 mg by mouth 2 (two) times daily.     multivitamin tablet  Take 1 tablet by mouth daily.     sulfamethoxazole-trimethoprim 800-160 MG per tablet  Commonly known as:  BACTRIM DS  Take 1 tablet by mouth every 12 (twelve) hours.           Follow-up Information   Schedule an appointment as soon as possible for a visit with Marlou Sa, ERIC,  MD.   Specialty:  Internal Medicine   Contact information:   [facility] [place] [address] [phone]       Follow up with Faye Ramsay, MD. (As needed, If symptoms worsen, call my cell phone [phone])    Specialty:  Internal Medicine   Contact information:   [address] [place] [address] [phone]        The results of significant diagnostics from this hospitalization (including imaging, microbiology, ancillary and laboratory) are listed below for reference.     Microbiology: Recent Results (from the past 240 hour(s))  CULTURE, BLOOD (ROUTINE X 2)     Status: None   Collection Time    08/23/13  7:10 PM      Result Value Ref Range Status   Specimen Description BLOOD LEFT ARM   Final   Special Requests BOTTLES DRAWN AEROBIC AND ANAEROBIC 5CC    Final   Culture  Setup Time     Final   Value: 08/24/2013 01:48     Performed at [facility]   Culture     Final   Value:        BLOOD CULTURE RECEIVED NO GROWTH TO DATE CULTURE WILL BE HELD FOR 5 DAYS BEFORE ISSUING A FINAL NEGATIVE REPORT     Performed at [facility]   Report Status PENDING   Incomplete  CULTURE, BLOOD (ROUTINE X 2)     Status: None   Collection Time    08/23/13  7:13 PM      Result Value Ref Range Status   Specimen Description BLOOD RIGHT HAND   Final   Special Requests     Final   Value: BOTTLES DRAWN AEROBIC AND  ANAEROBIC 5CC AER,6CC ANA   Culture  Setup Time     Final   Value: 08/24/2013 01:47     Performed at [facility]   Culture     Final   Value:        BLOOD CULTURE RECEIVED NO GROWTH TO DATE CULTURE WILL BE HELD FOR 5 DAYS BEFORE ISSUING A FINAL NEGATIVE REPORT     Performed at [facility]   Report Status PENDING   Incomplete     Labs: Basic Metabolic Panel:  Recent Labs Lab 08/23/13 1417 08/23/13 1910 08/24/13 0500 08/26/13 0350  NA 138  --  142 140  K 3.7  --  3.8 3.8  CL 100  --  105 104  CO2 25  --  26 27  GLUCOSE 241*  --  90 78  BUN 20  --  14 19  CREATININE 0.72 0.69 0.71 0.87  CALCIUM 9.5  --  9.3 9.5   Liver Function Tests:  Recent Labs Lab 08/23/13 1417  AST 50*  ALT 39*  ALKPHOS 113  BILITOT 0.6  PROT 7.9  ALBUMIN 3.2*   CBC:  Recent Labs Lab 08/23/13 1417 08/23/13 1910 08/24/13 0500 08/26/13 0350  WBC 7.6 7.4 6.3 7.3  NEUTROABS 4.9  --   --   --   HGB 13.9 14.4 13.2 13.8  HCT 39.6 41.1 38.8 40.6  MCV 93.0 92.8 93.5 95.1  PLT 234 255 236 248   CBG:  Recent Labs Lab 08/26/13 0659 08/26/13 1138 08/26/13 1559 08/26/13 2209 08/27/13 0638  GLUCAP 105* 200* 339* 197* 106*   SIGNED: Time coordinating discharge: Over 30 minutes  Theodis Blaze, MD  [facility] 08/27/2013, 9:41 AM Pager  [phone]  If 7PM-7AM, please contact night-coverage [email or web address] Password [email or web address]

## 2013-08-30 LAB — CULTURE, BLOOD (ROUTINE X 2)
CULTURE: NO GROWTH
Culture: NO GROWTH

## 2014-01-10 ENCOUNTER — Encounter: Payer: Self-pay | Admitting: Family

## 2014-01-11 ENCOUNTER — Ambulatory Visit (INDEPENDENT_AMBULATORY_CARE_PROVIDER_SITE_OTHER): Payer: Medicare Other | Admitting: Family

## 2014-01-11 ENCOUNTER — Encounter: Payer: Self-pay | Admitting: Family

## 2014-01-11 ENCOUNTER — Ambulatory Visit (HOSPITAL_COMMUNITY)
Admission: RE | Admit: 2014-01-11 | Discharge: 2014-01-11 | Disposition: A | Discharge status: Home / Self Care | Payer: Medicare Other | Source: Ambulatory Visit | Attending: Family | Admitting: Family

## 2014-01-11 ENCOUNTER — Ambulatory Visit (INDEPENDENT_AMBULATORY_CARE_PROVIDER_SITE_OTHER)
Admission: RE | Admit: 2014-01-11 | Discharge: 2014-01-11 | Disposition: A | Discharge status: Home / Self Care | Payer: Medicare Other | Source: Ambulatory Visit | Attending: Family | Admitting: Family

## 2014-01-11 VITALS — BP 141/62 | HR 69 | Resp 16 | Ht 66.0 in | Wt 201.0 lb

## 2014-01-11 DIAGNOSIS — Z48812 Encounter for surgical aftercare following surgery on the circulatory system: Secondary | ICD-10-CM

## 2014-01-11 DIAGNOSIS — M79609 Pain in unspecified limb: Secondary | ICD-10-CM

## 2014-01-11 DIAGNOSIS — I739 Peripheral vascular disease, unspecified: Secondary | ICD-10-CM

## 2014-01-11 DIAGNOSIS — I70219 Atherosclerosis of native arteries of extremities with intermittent claudication, unspecified extremity: Secondary | ICD-10-CM

## 2014-01-11 DIAGNOSIS — R29898 Other symptoms and signs involving the musculoskeletal system: Secondary | ICD-10-CM | POA: Insufficient documentation

## 2014-01-11 DIAGNOSIS — M79605 Pain in left leg: Secondary | ICD-10-CM | POA: Insufficient documentation

## 2014-01-11 NOTE — Patient Instructions (Signed)

## 2014-01-11 NOTE — Addendum Note (Signed)
Addended by: Mena Goes on: 01/11/2014 05:22 PM   Modules accepted: Orders

## 2014-01-11 NOTE — Progress Notes (Signed)
[facility] [place] HISTORY AND PHYSICAL -PAD  History of Present Illness Kristina Summers is a 78 y.o. female patient of Dr. Bridgett Larsson who is s/p right common iliac artery stent on 04/01/11; Left superficial femoral artery atherectomy and PTA on 06/17/11; Left superficial femoral artery PTA on 11/18/11. She returns today for follow up. When walking up an incline about 200-300 feet she develops bilateral anterior thigh weakness, no weakness in legs walking on flat ground, denies non healing wounds. She has been taking an antibiotic for about a week for a UTI, and had no problems walking on an incline until she was taking the antibiotic. Patient denies any history of stroke or TIA. The patient denies New Medical or Surgical History.  Pt Diabetic: Yes, states her FBS is 66-over 200, A1C in May, 2015 was 11.1, uncontrolled Pt smoker: former smoker, quit in 1991  Pt meds include: Statin :Yes ASA: Yes Other anticoagulants/antiplatelets: no, was taking coumadin for possibly atrial fib  Past Medical History  Diagnosis Date  . CAD (coronary artery disease)   . Obesity   . Atrial fibrillation   . Hypertension   . Diabetes mellitus   . Hyperlipidemia   . Peripheral vascular disease   . GI bleed 1/11  . Carotid artery occlusion   . Normal nuclear stress test 03/18/2011  . Angina   . GERD (gastroesophageal reflux disease)   . Arthritis   . Fibromyalgia   . Myocardial infarction ~ 1991  . UTI (lower urinary tract infection) Sept. 2015    Social History History  Substance Use Topics  . Smoking status: Former Smoker -- 2.00 packs/day for 30 years    Types: Cigarettes  . Smokeless tobacco: Never Used     Comment: stopped smoking cigarettes 1991  . Alcohol Use: No    Family History Family History  Problem Relation Age of Onset  . Other Brother     intestinal blockage    Past Surgical History  Procedure Laterality Date  . Trigger finger release  1996    right thumb   . Cataract extraction w/ intraocular lens  implant, bilateral  2004-2005  . Coronary artery bypass graft  2000    CABG X5  . Peripheral arterial stent graft  04/01/11    right common iliac  . Angioplasty  06/17/11    Left leg common femoral artery cannulation under u/s Left leg runoff  . Eye surgery      Allergies  Allergen Reactions  . Morphine And Related Nausea And Vomiting    Chest pain    . Lisinopril Cough  . Sertraline Hcl     REACTION: nausea  . Zoloft [Sertraline Hcl]     Nausea     Current Outpatient Prescriptions  Medication Sig Dispense Refill  . acetaminophen (TYLENOL) 325 MG tablet Take 2 tablets (650 mg total) by mouth every 6 (six) hours as needed for mild pain (or Fever >/= 101).  60 tablet  1  . amLODipine (NORVASC) 5 MG tablet Take 1 tablet by mouth daily.      Marland Kitchen aspirin 81 MG tablet Take 81 mg by mouth daily.      Marland Kitchen atorvastatin (LIPITOR) 80 MG tablet Take 1 tablet (80 mg total) by mouth daily.  30 tablet  0  . Calcium Carbonate-Vitamin D (CALCIUM PLUS VITAMIN D PO) Take 1 tablet by mouth daily.      . furosemide (LASIX) 40 MG tablet Take 20-60 mg by mouth 2 (two) times  daily. Pt takes 1.5 tabs (60mg ) in the morning and 1/2 tablet (20mg ) in the evening      . insulin NPH Human (HUMULIN N,NOVOLIN N) 100 UNIT/ML injection Inject into the skin.      Marland Kitchen insulin NPH Human (HUMULIN N,NOVOLIN N) 100 UNIT/ML injection Inject 34-38 Units into the skin 2 (two) times daily. 38 in am and 34 at night      . losartan (COZAAR) 50 MG tablet Take 50 mg by mouth 2 (two) times daily.        . metoprolol tartrate (LOPRESSOR) 25 MG tablet Take 25 mg by mouth 2 (two) times daily.       . Multiple Vitamin (MULTIVITAMIN) tablet Take 1 tablet by mouth daily.      . nitrofurantoin (MACRODANTIN) 100 MG capsule Take 100 mg by mouth every 12 (twelve) hours.      . LUTEIN PO Take 1 tablet by mouth daily.       . metFORMIN (GLUCOPHAGE) 500 MG tablet       . sulfamethoxazole-trimethoprim  (BACTRIM DS) 800-160 MG per tablet Take 1 tablet by mouth every 12 (twelve) hours.  20 tablet  0   No current facility-administered medications for this visit.    ROS: See HPI for pertinent positives and negatives.   Physical Examination  Filed Vitals:   01/11/14 1117  BP: 141/62  Pulse: 69  Resp: 16  Height: 5\' 6"  (1.676 m)  Weight: 201 lb (91.173 kg)  SpO2: 95%   Body mass index is 32.46 kg/(m^2).  General: A&O x 3, WDWN, obese female. Gait: normal Eyes: PERRLA. Pulmonary: CTAB, without wheezes , rales or rhonchi. Cardiac: regular Rythm , with detected murmur.         Carotid Bruits Right Left   Transmitted cardiac murmur Transmitted cardiac murmur  Aorta is not palpable. Radial pulses: are 2+ palpable and =.                           VASCULAR EXAM: Extremities without ischemic changes  without Gangrene; without open wounds.                                                                                                          LE Pulses Right Left       FEMORAL   palpable   palpable        POPLITEAL  not palpable   not palpable       POSTERIOR TIBIAL  2+ palpable   1+ palpable        DORSALIS PEDIS      ANTERIOR TIBIAL faintly palpable  1+ palpable    Abdomen: soft, NT, no masses. Skin: no rashes, no ulcers noted. Musculoskeletal: no muscle wasting or atrophy.  Neurologic: A&O X 3; Appropriate Affect ; SENSATION: normal; MOTOR FUNCTION:  moving all extremities equally, motor strength 5/5 throughout. Speech is fluent/normal. CN 2-12 intact.    Non-Invasive Vascular Imaging: DATE: 01/11/2014 [facility] - ILIAC DUPLEX EVALUATION    INDICATION: Iliac  stent    PREVIOUS INTERVENTION(S): Right common iliac artery stent on 04/01/11; Left superficial femoral artery atherectomy and PTA on 06/17/11; Left superficial femoral artery PTA on 11/18/11    DUPLEX EXAM:      Peak Systolic Velocity (cm/s)  AORTA - Proximal 60  AORTA - Mid 88  AORTA - Distal 85    RIGHT   LEFT  Peak Systolic Velocity (cm/s) Ratio (if abnormal) Waveform  Peak Systolic Velocity (cm/s) Ratio (if abnormal) Waveform  115  B Common Iliac Artery - Proximal 107  B  177  B Common Iliac Artery - Mid 152  B  129  B Common Iliac Artery - Distal 154  B  141  B External Iliac Artery - Proximal 124  B  142  B External Iliac Artery - Mid 115  B  119  B External Iliac Artery - Distal 157  B  Not Visualized    Internal Iliac Artery Not Visualized     0.91 Today's ABI / TBI 1.01  0.89 Previous ABI / TBI (07/06/13 ) 1.05    Waveform:    M - Monophasic       B - Biphasic       T - Triphasic  If Ankle Brachial Index (ABI) or Toe Brachial Index (TBI) performed, please see complete report     ADDITIONAL FINDINGS: Limited visualization of the abdominal vasculature and stent due to overlying bowel gas and patient body habitus.     IMPRESSION: 1. Patent right common iliac artery stent with Doppler velocities suggestive of no hemodynamically significant stenosis of the bilateral iliac arteries, based on limited visualization, as described above. 2. Comparison to the previous exam is noted on the second page of this report.     Compared to the previous exam:  No significant change in the right common iliac artery stent when compared to the previous exam on 07/06/13.    LOWER EXTREMITY ARTERIAL DUPLEX EVALUATION    INDICATION: Left leg revascularization    PREVIOUS INTERVENTION(S): Right common iliac artery stent on 04/01/11; Left superficial femoral artery atherectomy and PTA on 06/17/11; Left superficial femoral artery PTA on 11/18/11    DUPLEX EXAM:     RIGHT  LEFT   Peak Systolic Velocity (cm/s) Ratio (if abnormal) Waveform  Peak Systolic Velocity (cm/s) Ratio (if abnormal) Waveform     Common Femoral Artery 129  B     Deep Femoral Artery 109  B     Superficial Femoral Artery Proximal 115  B     Superficial Femoral Artery Mid 130/202  B     Superficial Femoral Artery Distal 84  B      Popliteal Artery 132  B     Posterior Tibial Artery Dist 73  T     Anterior Tibial Artery Distal 66  B     Peroneal Artery Distal 44  B  0.91 Today's ABI / TBI 1.01  0.89 Previous ABI / TBI (07/06/13 ) 1.05    Waveform:    M - Monophasic       B - Biphasic       T - Triphasic  If Ankle Brachial Index (ABI) or Toe Brachial Index (TBI) performed, please see complete report     ADDITIONAL FINDINGS: Partially-occlusive, calcific plaque noted throughout the left femoral-popliteal arterial system.    IMPRESSION: Doppler velocities suggest no focal greater than 50% stenosis in the left lower extremity arterial system, however acoustic shadowing from calcific plaque may  obscure higher velocity.    Compared to the previous exam:  Velocity of the left mid superficial femoral artery(309cm/s) from the previous exam on 07/06/13 was not adequately duplicated.   01/09/13 carotid Duplex at [facility]: The vertebral arteries appear patent with antegrade flow. - Findings consistent with 1-39 percent stenosis involving the right internal carotid artery and the left internal carotid artery.   ASSESSMENT: Kristina Summers is a 78 y.o. female who is s/p right common iliac artery stent on 04/01/11; Left superficial femoral artery atherectomy and PTA on 06/17/11; Left superficial femoral artery PTA on 11/18/11. She had no claudication symptoms, even with walking daily on an incline, until taking an antibiotic for a week for a UTI. She can walk on a flat surface with no claudication symptoms.  Aorto iliac Duplex today demonstrates a patent right common iliac artery stent with Doppler velocities suggestive of no hemodynamically significant stenosis of the bilateral iliac arteries, based on limited visualization, as described above. No significant change in the right common iliac artery stent when compared to the previous exam on 07/06/13.  Left lower extremity arterial Duplex today demonstrates no focal greater than 50% stenosis  in the left lower extremity arterial system, however acoustic shadowing from calcific plaque may obscure higher velocity. ABI's in both legs are normal.   PLAN:  I discussed in depth with the patient the nature of atherosclerosis, and emphasized the importance of maximal medical management including strict control of blood pressure, blood glucose, and lipid levels, obtaining regular exercise, and continued cessation of smoking.  The patient is aware that without maximal medical management the underlying atherosclerotic disease process will progress, limiting the benefit of any interventions.  Based on the patient's vascular studies and examination, pt will return to clinic in 6 months with ABI and aortoiliac duplex and LLE arterial duplex.  The patient was given information about PAD including signs, symptoms, treatment, what symptoms should prompt the patient to seek immediate medical care, and risk reduction measures to take.  Clemon Chambers, RN, MSN, FNP-C [facility] Phone: [phone]  Clinic MD: Bridgett Larsson  01/11/2014 11:25 AM

## 2014-02-06 ENCOUNTER — Encounter: Payer: Self-pay | Admitting: Podiatry

## 2014-02-06 ENCOUNTER — Ambulatory Visit (INDEPENDENT_AMBULATORY_CARE_PROVIDER_SITE_OTHER): Payer: Medicare Other | Admitting: Podiatry

## 2014-02-06 VITALS — BP 133/66 | HR 66 | Resp 12

## 2014-02-06 DIAGNOSIS — I70219 Atherosclerosis of native arteries of extremities with intermittent claudication, unspecified extremity: Secondary | ICD-10-CM

## 2014-02-06 DIAGNOSIS — E1159 Type 2 diabetes mellitus with other circulatory complications: Secondary | ICD-10-CM

## 2014-02-06 DIAGNOSIS — E1151 Type 2 diabetes mellitus with diabetic peripheral angiopathy without gangrene: Secondary | ICD-10-CM

## 2014-02-06 DIAGNOSIS — E0849 Diabetes mellitus due to underlying condition with other diabetic neurological complication: Secondary | ICD-10-CM

## 2014-02-06 DIAGNOSIS — I739 Peripheral vascular disease, unspecified: Secondary | ICD-10-CM

## 2014-02-06 DIAGNOSIS — B351 Tinea unguium: Secondary | ICD-10-CM

## 2014-02-06 NOTE — Patient Instructions (Signed)
Diabetes and Foot Care Diabetes may cause you to have problems because of poor blood supply (circulation) to your feet and legs. This may cause the skin on your feet to become thinner, break easier, and heal more slowly. Your skin may become dry, and the skin may peel and crack. You may also have nerve damage in your legs and feet causing decreased feeling in them. You may not notice minor injuries to your feet that could lead to infections or more serious problems. Taking care of your feet is one of the most important things you can do for yourself.  HOME CARE INSTRUCTIONS  Wear shoes at all times, even in the house. Do not go barefoot. Bare feet are easily injured.  Check your feet daily for blisters, cuts, and redness. If you cannot see the bottom of your feet, use a mirror or ask someone for help.  Wash your feet with warm water (do not use hot water) and mild soap. Then pat your feet and the areas between your toes until they are completely dry. Do not soak your feet as this can dry your skin.  Apply a moisturizing lotion or petroleum jelly (that does not contain alcohol and is unscented) to the skin on your feet and to dry, brittle toenails. Do not apply lotion between your toes.  Trim your toenails straight across. Do not dig under them or around the cuticle. File the edges of your nails with an emery board or nail file.  Do not cut corns or calluses or try to remove them with medicine.  Wear clean socks or stockings every day. Make sure they are not too tight. Do not wear knee-high stockings since they may decrease blood flow to your legs.  Wear shoes that fit properly and have enough cushioning. To break in new shoes, wear them for just a few hours a day. This prevents you from injuring your feet. Always look in your shoes before you put them on to be sure there are no objects inside.  Do not cross your legs. This may decrease the blood flow to your feet.  If you find a minor scrape,  cut, or break in the skin on your feet, keep it and the skin around it clean and dry. These areas may be cleansed with mild soap and water. Do not cleanse the area with peroxide, alcohol, or iodine.  When you remove an adhesive bandage, be sure not to damage the skin around it.  If you have a wound, look at it several times a day to make sure it is healing.  Do not use heating pads or hot water bottles. They may burn your skin. If you have lost feeling in your feet or legs, you may not know it is happening until it is too late.  Make sure your health care provider performs a complete foot exam at least annually or more often if you have foot problems. Report any cuts, sores, or bruises to your health care provider immediately. SEEK MEDICAL CARE IF:   You have an injury that is not healing.  You have cuts or breaks in the skin.  You have an ingrown nail.  You notice redness on your legs or feet.  You feel burning or tingling in your legs or feet.  You have pain or cramps in your legs and feet.  Your legs or feet are numb.  Your feet always feel cold. SEEK IMMEDIATE MEDICAL CARE IF:   There is increasing redness,   swelling, or pain in or around a wound.  There is a red line that goes up your leg.  Pus is coming from a wound.  You develop a fever or as directed by your health care provider.  You notice a bad smell coming from an ulcer or wound. Document Released: 04/02/2000 Document Revised: 12/06/2012 Document Reviewed: 09/12/2012 Exit[employer] Patient Information 2015 [employer]. This information is not intended to replace advice given to you by your health care provider. Make sure you discuss any questions you have with your health care provider.  

## 2014-02-06 NOTE — Progress Notes (Signed)
   Subjective:    Patient ID: Kristina Summers, female    DOB: 07-15-1935, 78 y.o.   MRN: [id number]  HPI N-THICK, DISCOLORATION L-B/L TOENAILS  D-5 MONTHS O-SLOWLY C-SAME A-NONE T-TRIM  ALSO, TRIM B/L CALLUS Patient states that she's been to another local regional podiatrist more recently and custom diabetic shoes were dispensed. She says the custom shoes were too small and she never wore them. She has not returned to the other local podiatrist  Review of Systems  HENT: Positive for hearing loss.   Eyes: Positive for visual disturbance.  Musculoskeletal: Positive for back pain.       Objective:   Physical Exam  Orientated x3  Vascular: DP pulses 0/4 bilaterally PT pulse right 0/4 PT left 2/4  Neurological: Sensation to 10 g monofilament wire intact 0/5 bilaterally Vibratory sensation nonreactive bilaterally Ankle reflex equal and reactive bilaterally  Dermatological: The toenails are elongated, incurvated, discolored 6-10 Minimal plantar keratoses fifth MPJ bilaterally Well-healed surgical scar medial right lower leg Small keratoses fifth left toe  Musculoskeletal: HAV left Tailors s bunion bilaterally       Assessment & Plan:   Assessment:  Peripheral arterial disease associated with diabetes Diabetic peripheral neuropathy Loss of protective sensation bilaterally Onychomycoses 6-10 Keratoses  Plan: Debrided toenails x10 without a bleeding No debridement needed for plantar keratoses fifth MPJ bilaterally Debrided keratoses fifth left toe without bleeding  Patient advised to contact previous podiatrist to exchanged the the diabetic shoes for larger shoes  Reappoint x3 months

## 2014-03-27 ENCOUNTER — Encounter (HOSPITAL_COMMUNITY): Payer: Self-pay | Admitting: Vascular Surgery

## 2014-03-28 ENCOUNTER — Encounter (HOSPITAL_COMMUNITY): Payer: Self-pay | Admitting: Vascular Surgery

## 2014-04-17 ENCOUNTER — Encounter (HOSPITAL_COMMUNITY): Payer: Self-pay

## 2014-04-17 ENCOUNTER — Emergency Department (HOSPITAL_COMMUNITY)
Admission: EM | Admit: 2014-04-17 | Discharge: 2014-04-18 | Disposition: A | Discharge status: Home / Self Care | Payer: Medicare Other | Attending: Emergency Medicine | Admitting: Emergency Medicine

## 2014-04-17 ENCOUNTER — Emergency Department (HOSPITAL_COMMUNITY): Payer: Medicare Other

## 2014-04-17 DIAGNOSIS — Z79899 Other long term (current) drug therapy: Secondary | ICD-10-CM | POA: Diagnosis not present

## 2014-04-17 DIAGNOSIS — M1990 Unspecified osteoarthritis, unspecified site: Secondary | ICD-10-CM | POA: Insufficient documentation

## 2014-04-17 DIAGNOSIS — I2510 Atherosclerotic heart disease of native coronary artery without angina pectoris: Secondary | ICD-10-CM | POA: Insufficient documentation

## 2014-04-17 DIAGNOSIS — E669 Obesity, unspecified: Secondary | ICD-10-CM | POA: Insufficient documentation

## 2014-04-17 DIAGNOSIS — Z87440 Personal history of urinary (tract) infections: Secondary | ICD-10-CM | POA: Insufficient documentation

## 2014-04-17 DIAGNOSIS — Z794 Long term (current) use of insulin: Secondary | ICD-10-CM | POA: Diagnosis not present

## 2014-04-17 DIAGNOSIS — I252 Old myocardial infarction: Secondary | ICD-10-CM | POA: Diagnosis not present

## 2014-04-17 DIAGNOSIS — I10 Essential (primary) hypertension: Secondary | ICD-10-CM | POA: Insufficient documentation

## 2014-04-17 DIAGNOSIS — Z87891 Personal history of nicotine dependence: Secondary | ICD-10-CM | POA: Diagnosis not present

## 2014-04-17 DIAGNOSIS — R079 Chest pain, unspecified: Secondary | ICD-10-CM | POA: Diagnosis not present

## 2014-04-17 DIAGNOSIS — E785 Hyperlipidemia, unspecified: Secondary | ICD-10-CM | POA: Diagnosis not present

## 2014-04-17 DIAGNOSIS — Z951 Presence of aortocoronary bypass graft: Secondary | ICD-10-CM | POA: Insufficient documentation

## 2014-04-17 DIAGNOSIS — E119 Type 2 diabetes mellitus without complications: Secondary | ICD-10-CM | POA: Diagnosis not present

## 2014-04-17 DIAGNOSIS — Z8719 Personal history of other diseases of the digestive system: Secondary | ICD-10-CM | POA: Diagnosis not present

## 2014-04-17 DIAGNOSIS — Z7982 Long term (current) use of aspirin: Secondary | ICD-10-CM | POA: Diagnosis not present

## 2014-04-17 LAB — CBC WITH DIFFERENTIAL/PLATELET
Basophils Absolute: 0.1 10*3/uL (ref 0.0–0.1)
Basophils Relative: 1 % (ref 0–1)
EOS ABS: 0.2 10*3/uL (ref 0.0–0.7)
Eosinophils Relative: 2 % (ref 0–5)
HCT: 38.6 % (ref 36.0–46.0)
Hemoglobin: 13.3 g/dL (ref 12.0–15.0)
LYMPHS ABS: 0.9 10*3/uL (ref 0.7–4.0)
LYMPHS PCT: 10 % — AB (ref 12–46)
MCH: 31.7 pg (ref 26.0–34.0)
MCHC: 34.5 g/dL (ref 30.0–36.0)
MCV: 91.9 fL (ref 78.0–100.0)
Monocytes Absolute: 0.8 10*3/uL (ref 0.1–1.0)
Monocytes Relative: 10 % (ref 3–12)
NEUTROS ABS: 6.7 10*3/uL (ref 1.7–7.7)
Neutrophils Relative %: 77 % (ref 43–77)
PLATELETS: 239 10*3/uL (ref 150–400)
RBC: 4.2 MIL/uL (ref 3.87–5.11)
RDW: 12.5 % (ref 11.5–15.5)
WBC: 8.6 10*3/uL (ref 4.0–10.5)

## 2014-04-17 LAB — CBG MONITORING, ED: Glucose-Capillary: 424 mg/dL — ABNORMAL HIGH (ref 70–99)

## 2014-04-17 LAB — I-STAT TROPONIN, ED: Troponin i, poc: 0.01 ng/mL (ref 0.00–0.08)

## 2014-04-17 MED ORDER — INSULIN ASPART 100 UNIT/ML ~~LOC~~ SOLN
5.0000 [IU] | Freq: Once | SUBCUTANEOUS | Status: AC
  Administered 2014-04-17: 5 [IU] via INTRAVENOUS
  Filled 2014-04-17: qty 1

## 2014-04-17 NOTE — ED Notes (Signed)
CBG [id number] 

## 2014-04-17 NOTE — ED Notes (Signed)
Per ems: pt from home and was sitting down knitting about an hour ago when she began to have upper left and right alternating chest pain, like "being kicked in the chest" that radiated to her back. Pt reports she felt nauseous and clammy. She took 324 baby aspirin and 3 nitro tabs and after the 3rd tablet her CP went away and pt is now asymptomatic and chest pain free. Pt A&OX4. EMS reports her CBG was 554.

## 2014-04-17 NOTE — ED Provider Notes (Signed)
CSN: [id number]     Arrival date & time 04/17/14  2232 History   None    Chief Complaint  Patient presents with  . Chest Pain     (Consider location/radiation/quality/duration/timing/severity/associated sxs/prior Treatment) HPI Comments: Patient is a 78 year old female with history of CABG 5 many years ago. She presents today with complaints of stabbing pains in her chest abdomen and lower back that started acutely while knitting at approximately 9:00 this evening. She denied any shortness of breath but did admit to some nausea and feeling clammy. She took 3 nitroglycerin and is currently pain-free. She has had no problems since her bypass surgery which she tells me was nearly 15 years ago.  Patient is a 78 y.o. female presenting with chest pain. The history is provided by the patient.  Chest Pain Pain quality: stabbing   Pain radiates to:  Does not radiate Pain radiates to the back: no   Pain severity:  Moderate Onset quality:  Sudden Duration:  3 minutes Timing:  Constant Progression:  Resolved Chronicity:  New Relieved by:  Nitroglycerin Worsened by:  Nothing tried Ineffective treatments:  None tried   Past Medical History  Diagnosis Date  . CAD (coronary artery disease)   . Obesity   . Atrial fibrillation   . Hypertension   . Diabetes mellitus   . Hyperlipidemia   . Peripheral vascular disease   . GI bleed 1/11  . Carotid artery occlusion   . Normal nuclear stress test 03/18/2011  . Angina   . GERD (gastroesophageal reflux disease)   . Arthritis   . Fibromyalgia   . Myocardial infarction ~ 1991  . UTI (lower urinary tract infection) Sept. 2015   Past Surgical History  Procedure Laterality Date  . Trigger finger release  1996    right thumb  . Cataract extraction w/ intraocular lens  implant, bilateral  2004-2005  . Coronary artery bypass graft  2000    CABG X5  . Peripheral arterial stent graft  04/01/11    right common iliac  . Angioplasty  06/17/11     Left leg common femoral artery cannulation under u/s Left leg runoff  . Eye surgery    . Abdominal aortagram N/A 04/01/2011    Procedure: ABDOMINAL AORTAGRAM;  Surgeon: Conrad [place], MD;  Location: [facility] CATH LAB;  Service: Cardiovascular;  Laterality: N/A;  . Percutaneous stent intervention Right 04/01/2011    Procedure: PERCUTANEOUS STENT INTERVENTION;  Surgeon: Conrad [place], MD;  Location: [facility] CATH LAB;  Service: Cardiovascular;  Laterality: Right;  rt iliac stent  . Lower extremity angiogram Bilateral 04/01/2011    Procedure: LOWER EXTREMITY ANGIOGRAM;  Surgeon: Conrad [place], MD;  Location: [facility] CATH LAB;  Service: Cardiovascular;  Laterality: Bilateral;  . Lower extremity angiogram Left 06/17/2011    Procedure: LOWER EXTREMITY ANGIOGRAM;  Surgeon: Conrad [place], MD;  Location: [facility] CATH LAB;  Service: Cardiovascular;  Laterality: Left;  . Lower extremity angiogram N/A 11/18/2011    Procedure: LOWER EXTREMITY ANGIOGRAM;  Surgeon: Conrad [place], MD;  Location: [facility] CATH LAB;  Service: Cardiovascular;  Laterality: N/A;   Family History  Problem Relation Age of Onset  . Other Brother     intestinal blockage   History  Substance Use Topics  . Smoking status: Former Smoker -- 2.00 packs/day for 30 years    Types: Cigarettes  . Smokeless tobacco: Never Used     Comment: stopped smoking cigarettes 1991  . Alcohol Use: No   OB  History    No data available     Review of Systems  Cardiovascular: Positive for chest pain.  All other systems reviewed and are negative.     Allergies  Morphine and related; Lisinopril; Sertraline hcl; and Zoloft  Home Medications   Prior to Admission medications   Medication Sig Start Date End Date Taking? Authorizing Provider  acetaminophen (TYLENOL) 325 MG tablet Take 2 tablets (650 mg total) by mouth every 6 (six) hours as needed for mild pain (or Fever >/= 101). 08/27/13   Theodis Blaze, MD  amLODipine (NORVASC) 5 MG tablet Take 1 tablet by mouth daily. 03/06/12    Historical Provider, MD  aspirin 81 MG tablet Take 81 mg by mouth daily.    Historical Provider, MD  atorvastatin (LIPITOR) 80 MG tablet Take 1 tablet (80 mg total) by mouth daily. 11/27/12   Liam Graham, PA-C  Calcium Carbonate-Vitamin D (CALCIUM PLUS VITAMIN D PO) Take 1 tablet by mouth daily.    Historical Provider, MD  fluconazole (DIFLUCAN) 150 MG tablet  01/23/14   Historical Provider, MD  furosemide (LASIX) 40 MG tablet Take 20-60 mg by mouth 2 (two) times daily. Pt takes 1.5 tabs (60mg ) in the morning and 1/2 tablet (20mg ) in the evening    Historical Provider, MD  HUMULIN R 100 UNIT/ML injection  01/23/14   Historical Provider, MD  insulin NPH Human (HUMULIN N,NOVOLIN N) 100 UNIT/ML injection Inject into the skin.    Historical Provider, MD  insulin NPH Human (HUMULIN N,NOVOLIN N) 100 UNIT/ML injection Inject 34-38 Units into the skin 2 (two) times daily. 38 in am and 34 at night 11/27/12   Liam Graham, PA-C  losartan (COZAAR) 50 MG tablet Take 50 mg by mouth 2 (two) times daily.      Historical Provider, MD  LUTEIN PO Take 1 tablet by mouth daily.     Historical Provider, MD  metFORMIN (GLUCOPHAGE) 500 MG tablet  12/09/11   Historical Provider, MD  metoprolol tartrate (LOPRESSOR) 25 MG tablet Take 25 mg by mouth 2 (two) times daily.     Historical Provider, MD  Multiple Vitamin (MULTIVITAMIN) tablet Take 1 tablet by mouth daily.    Historical Provider, MD  nitrofurantoin (MACRODANTIN) 100 MG capsule Take 100 mg by mouth every 12 (twelve) hours.    Historical Provider, MD  nitrofurantoin, macrocrystal-monohydrate, (MACROBID) 100 MG capsule  01/04/14   Historical Provider, MD  sulfamethoxazole-trimethoprim (BACTRIM DS) 800-160 MG per tablet Take 1 tablet by mouth every 12 (twelve) hours. 08/27/13   Theodis Blaze, MD   BP 174/65 mmHg  Pulse 75  Temp(Src) 97.9 F (36.6 C) (Oral)  Resp 18  Ht 5\' 6"  (1.676 m)  Wt 204 lb (92.534 kg)  BMI 32.94 kg/m2  SpO2 94% Physical Exam   Constitutional: She is oriented to person, place, and time. She appears well-developed and well-nourished. No distress.  HENT:  Head: Normocephalic and atraumatic.  Mouth/Throat: Oropharynx is clear and moist.  Neck: Normal range of motion. Neck supple.  Cardiovascular: Normal rate and regular rhythm.  Exam reveals no gallop and no friction rub.   No murmur heard. Pulmonary/Chest: Effort normal and breath sounds normal. No respiratory distress. She has no wheezes. She has no rales. She exhibits no tenderness.  Abdominal: Soft. Bowel sounds are normal. She exhibits no distension. There is no tenderness.  Musculoskeletal: Normal range of motion. She exhibits no edema.  Lymphadenopathy:    She has no cervical adenopathy.  Neurological: She is alert and oriented to person, place, and time.  Skin: Skin is warm and dry. She is not diaphoretic.  Nursing note and vitals reviewed.   ED Course  Procedures (including critical care time) Labs Review Labs Reviewed  CBG MONITORING, ED - Abnormal; Notable for the following:    Glucose-Capillary 424 (*)    All other components within normal limits  COMPREHENSIVE METABOLIC PANEL  CBC WITH DIFFERENTIAL  I-STAT TROPOININ, ED    Imaging Review No results found.   EKG Interpretation   Date/Time:  Wednesday April 17 2014 22:33:35 EST Ventricular Rate:  76 PR Interval:  197 QRS Duration: 111 QT Interval:  397 QTC Calculation: 446 R Axis:   72 Text Interpretation:  Sinus or ectopic atrial rhythm Borderline  repolarization abnormality since last tracing no significant change  Confirmed by Wilson Singer  MD, [place] ([id number]) on 04/17/2014 10:55:09 PM      MDM   Final diagnoses:  None    Patient presents with complaints of discomfort in her chest, abdomen, and back that she describes as a stabbing sensation. She has a history of coronary artery disease however this feels different from her prior heart pain. Her initial EKG and troponin are  negative. She was kept in the ER for a second troponin which was also negative. She remained pain-free throughout her emergency department course.  I doubt a cardiac etiology as her symptoms are different and atypical and her workup is unremarkable. She will be discharged to home with instructions to follow-up with her doctor next week. She understands to return if her symptoms worsen or change.    Veryl Speak, MD 04/18/14 [phone]

## 2014-04-18 DIAGNOSIS — R079 Chest pain, unspecified: Secondary | ICD-10-CM | POA: Diagnosis not present

## 2014-04-18 LAB — CBG MONITORING, ED: GLUCOSE-CAPILLARY: 349 mg/dL — AB (ref 70–99)

## 2014-04-18 LAB — I-STAT TROPONIN, ED: TROPONIN I, POC: 0.01 ng/mL (ref 0.00–0.08)

## 2014-04-18 LAB — COMPREHENSIVE METABOLIC PANEL
ALT: 30 U/L (ref 0–35)
ANION GAP: 6 (ref 5–15)
AST: 36 U/L (ref 0–37)
Albumin: 3.2 g/dL — ABNORMAL LOW (ref 3.5–5.2)
Alkaline Phosphatase: 109 U/L (ref 39–117)
BUN: 19 mg/dL (ref 6–23)
CALCIUM: 9.4 mg/dL (ref 8.4–10.5)
CO2: 28 mmol/L (ref 19–32)
CREATININE: 0.91 mg/dL (ref 0.50–1.10)
Chloride: 100 mEq/L (ref 96–112)
GFR calc non Af Amer: 60 mL/min — ABNORMAL LOW (ref >90)
GFR, EST AFRICAN AMERICAN: 69 mL/min — AB (ref >90)
GLUCOSE: 462 mg/dL — AB (ref 70–99)
Potassium: 4.2 mmol/L (ref 3.5–5.1)
Sodium: 134 mmol/L — ABNORMAL LOW (ref 135–145)
TOTAL PROTEIN: 7.6 g/dL (ref 6.0–8.3)
Total Bilirubin: 0.6 mg/dL (ref 0.3–1.2)

## 2014-04-18 NOTE — Discharge Instructions (Signed)
Follow-up with your cardiologist in the next few days, and return to the ER if your symptoms substantially worsen or change.   Chest Pain (Nonspecific) It is often hard to give a specific diagnosis for the cause of chest pain. There is always a chance that your pain could be related to something serious, such as a heart attack or a blood clot in the lungs. You need to follow up with your health care provider for further evaluation. CAUSES   Heartburn.  Pneumonia or bronchitis.  Anxiety or stress.  Inflammation around your heart (pericarditis) or lung (pleuritis or pleurisy).  A blood clot in the lung.  A collapsed lung (pneumothorax). It can develop suddenly on its own (spontaneous pneumothorax) or from trauma to the chest.  Shingles infection (herpes zoster virus). The chest wall is composed of bones, muscles, and cartilage. Any of these can be the source of the pain.  The bones can be bruised by injury.  The muscles or cartilage can be strained by coughing or overwork.  The cartilage can be affected by inflammation and become sore (costochondritis). DIAGNOSIS  Lab tests or other studies may be needed to find the cause of your pain. Your health care provider may have you take a test called an ambulatory electrocardiogram (ECG). An ECG records your heartbeat patterns over a 24-hour period. You may also have other tests, such as:  Transthoracic echocardiogram (TTE). During echocardiography, sound waves are used to evaluate how blood flows through your heart.  Transesophageal echocardiogram (TEE).  Cardiac monitoring. This allows your health care provider to monitor your heart rate and rhythm in real time.  Holter monitor. This is a portable device that records your heartbeat and can help diagnose heart arrhythmias. It allows your health care provider to track your heart activity for several days, if needed.  Stress tests by exercise or by giving medicine that makes the heart beat  faster. TREATMENT   Treatment depends on what may be causing your chest pain. Treatment may include:  Acid blockers for heartburn.  Anti-inflammatory medicine.  Pain medicine for inflammatory conditions.  Antibiotics if an infection is present.  You may be advised to change lifestyle habits. This includes stopping smoking and avoiding alcohol, caffeine, and chocolate.  You may be advised to keep your head raised (elevated) when sleeping. This reduces the chance of acid going backward from your stomach into your esophagus. Most of the time, nonspecific chest pain will improve within 2-3 days with rest and mild pain medicine.  HOME CARE INSTRUCTIONS   If antibiotics were prescribed, take them as directed. Finish them even if you start to feel better.  For the next few days, avoid physical activities that bring on chest pain. Continue physical activities as directed.  Do not use any tobacco products, including cigarettes, chewing tobacco, or electronic cigarettes.  Avoid drinking alcohol.  Only take medicine as directed by your health care provider.  Follow your health care provider's suggestions for further testing if your chest pain does not go away.  Keep any follow-up appointments you made. If you do not go to an appointment, you could develop lasting (chronic) problems with pain. If there is any problem keeping an appointment, call to reschedule. SEEK MEDICAL CARE IF:   Your chest pain does not go away, even after treatment.  You have a rash with blisters on your chest.  You have a fever. SEEK IMMEDIATE MEDICAL CARE IF:   You have increased chest pain or pain that spreads  to your arm, neck, jaw, back, or abdomen.  You have shortness of breath.  You have an increasing cough, or you cough up blood.  You have severe back or abdominal pain.  You feel nauseous or vomit.  You have severe weakness.  You faint.  You have chills. This is an emergency. Do not wait to  see if the pain will go away. Get medical help at once. Call your local emergency services (911 in [place] Do not drive yourself to the hospital. MAKE SURE YOU:   Understand these instructions.  Will watch your condition.  Will get help right away if you are not doing well or get worse. Document Released: 01/13/2005 Document Revised: 04/10/2013 Document Reviewed: 11/09/2007 [facility] Patient Information 2015 [place], [place]. This information is not intended to replace advice given to you by your health care provider. Make sure you discuss any questions you have with your health care provider.

## 2014-05-15 ENCOUNTER — Ambulatory Visit: Payer: Medicare Other | Admitting: Podiatry

## 2014-05-24 DIAGNOSIS — Z8673 Personal history of transient ischemic attack (TIA), and cerebral infarction without residual deficits: Secondary | ICD-10-CM

## 2014-05-24 HISTORY — DX: Personal history of transient ischemic attack (TIA), and cerebral infarction without residual deficits: Z86.73

## 2014-05-29 ENCOUNTER — Encounter (HOSPITAL_COMMUNITY): Payer: Self-pay | Admitting: *Deleted

## 2014-05-29 ENCOUNTER — Emergency Department (HOSPITAL_COMMUNITY): Payer: Medicare Other

## 2014-05-29 ENCOUNTER — Inpatient Hospital Stay (HOSPITAL_COMMUNITY)
Admission: EM | Admit: 2014-05-29 | Discharge: 2014-06-03 | DRG: 871 | Disposition: A | Discharge status: Skilled Nursing Facility | Payer: Medicare Other | Attending: Internal Medicine | Admitting: Internal Medicine

## 2014-05-29 DIAGNOSIS — Y92002 Bathroom of unspecified non-institutional (private) residence single-family (private) house as the place of occurrence of the external cause: Secondary | ICD-10-CM

## 2014-05-29 DIAGNOSIS — I638 Other cerebral infarction: Secondary | ICD-10-CM | POA: Diagnosis not present

## 2014-05-29 DIAGNOSIS — G629 Polyneuropathy, unspecified: Secondary | ICD-10-CM | POA: Diagnosis present

## 2014-05-29 DIAGNOSIS — I119 Hypertensive heart disease without heart failure: Secondary | ICD-10-CM

## 2014-05-29 DIAGNOSIS — E871 Hypo-osmolality and hyponatremia: Secondary | ICD-10-CM | POA: Diagnosis present

## 2014-05-29 DIAGNOSIS — Z95820 Peripheral vascular angioplasty status with implants and grafts: Secondary | ICD-10-CM

## 2014-05-29 DIAGNOSIS — E1151 Type 2 diabetes mellitus with diabetic peripheral angiopathy without gangrene: Secondary | ICD-10-CM

## 2014-05-29 DIAGNOSIS — E785 Hyperlipidemia, unspecified: Secondary | ICD-10-CM

## 2014-05-29 DIAGNOSIS — Z87891 Personal history of nicotine dependence: Secondary | ICD-10-CM | POA: Diagnosis not present

## 2014-05-29 DIAGNOSIS — E1159 Type 2 diabetes mellitus with other circulatory complications: Secondary | ICD-10-CM

## 2014-05-29 DIAGNOSIS — Z951 Presence of aortocoronary bypass graft: Secondary | ICD-10-CM

## 2014-05-29 DIAGNOSIS — M6282 Rhabdomyolysis: Secondary | ICD-10-CM | POA: Diagnosis present

## 2014-05-29 DIAGNOSIS — I639 Cerebral infarction, unspecified: Secondary | ICD-10-CM

## 2014-05-29 DIAGNOSIS — A419 Sepsis, unspecified organism: Principal | ICD-10-CM | POA: Diagnosis present

## 2014-05-29 DIAGNOSIS — Z87440 Personal history of urinary (tract) infections: Secondary | ICD-10-CM

## 2014-05-29 DIAGNOSIS — I4891 Unspecified atrial fibrillation: Secondary | ICD-10-CM

## 2014-05-29 DIAGNOSIS — Z6830 Body mass index (BMI) 30.0-30.9, adult: Secondary | ICD-10-CM | POA: Diagnosis not present

## 2014-05-29 DIAGNOSIS — D638 Anemia in other chronic diseases classified elsewhere: Secondary | ICD-10-CM | POA: Diagnosis present

## 2014-05-29 DIAGNOSIS — H548 Legal blindness, as defined in USA: Secondary | ICD-10-CM | POA: Diagnosis present

## 2014-05-29 DIAGNOSIS — I63412 Cerebral infarction due to embolism of left middle cerebral artery: Secondary | ICD-10-CM | POA: Diagnosis not present

## 2014-05-29 DIAGNOSIS — Z79899 Other long term (current) drug therapy: Secondary | ICD-10-CM

## 2014-05-29 DIAGNOSIS — G9340 Encephalopathy, unspecified: Secondary | ICD-10-CM | POA: Diagnosis not present

## 2014-05-29 DIAGNOSIS — I480 Paroxysmal atrial fibrillation: Secondary | ICD-10-CM | POA: Diagnosis present

## 2014-05-29 DIAGNOSIS — L03031 Cellulitis of right toe: Secondary | ICD-10-CM

## 2014-05-29 DIAGNOSIS — I2510 Atherosclerotic heart disease of native coronary artery without angina pectoris: Secondary | ICD-10-CM | POA: Diagnosis present

## 2014-05-29 DIAGNOSIS — M797 Fibromyalgia: Secondary | ICD-10-CM | POA: Diagnosis present

## 2014-05-29 DIAGNOSIS — Z7982 Long term (current) use of aspirin: Secondary | ICD-10-CM | POA: Diagnosis not present

## 2014-05-29 DIAGNOSIS — R7401 Elevation of levels of liver transaminase levels: Secondary | ICD-10-CM

## 2014-05-29 DIAGNOSIS — N179 Acute kidney failure, unspecified: Secondary | ICD-10-CM | POA: Diagnosis not present

## 2014-05-29 DIAGNOSIS — M79605 Pain in left leg: Secondary | ICD-10-CM

## 2014-05-29 DIAGNOSIS — S82831A Other fracture of upper and lower end of right fibula, initial encounter for closed fracture: Secondary | ICD-10-CM

## 2014-05-29 DIAGNOSIS — R740 Nonspecific elevation of levels of transaminase and lactic acid dehydrogenase [LDH]: Secondary | ICD-10-CM

## 2014-05-29 DIAGNOSIS — E669 Obesity, unspecified: Secondary | ICD-10-CM | POA: Diagnosis present

## 2014-05-29 DIAGNOSIS — R29898 Other symptoms and signs involving the musculoskeletal system: Secondary | ICD-10-CM

## 2014-05-29 DIAGNOSIS — I252 Old myocardial infarction: Secondary | ICD-10-CM | POA: Diagnosis not present

## 2014-05-29 DIAGNOSIS — N390 Urinary tract infection, site not specified: Secondary | ICD-10-CM | POA: Diagnosis present

## 2014-05-29 DIAGNOSIS — T796XXA Traumatic ischemia of muscle, initial encounter: Secondary | ICD-10-CM

## 2014-05-29 DIAGNOSIS — E1165 Type 2 diabetes mellitus with hyperglycemia: Secondary | ICD-10-CM | POA: Diagnosis present

## 2014-05-29 DIAGNOSIS — I70219 Atherosclerosis of native arteries of extremities with intermittent claudication, unspecified extremity: Secondary | ICD-10-CM

## 2014-05-29 DIAGNOSIS — I6340 Cerebral infarction due to embolism of unspecified cerebral artery: Secondary | ICD-10-CM | POA: Diagnosis not present

## 2014-05-29 DIAGNOSIS — S82491A Other fracture of shaft of right fibula, initial encounter for closed fracture: Secondary | ICD-10-CM | POA: Diagnosis not present

## 2014-05-29 DIAGNOSIS — I482 Chronic atrial fibrillation, unspecified: Secondary | ICD-10-CM | POA: Diagnosis present

## 2014-05-29 DIAGNOSIS — W19XXXD Unspecified fall, subsequent encounter: Secondary | ICD-10-CM | POA: Diagnosis not present

## 2014-05-29 DIAGNOSIS — R4701 Aphasia: Secondary | ICD-10-CM | POA: Diagnosis not present

## 2014-05-29 DIAGNOSIS — I739 Peripheral vascular disease, unspecified: Secondary | ICD-10-CM

## 2014-05-29 DIAGNOSIS — Z794 Long term (current) use of insulin: Secondary | ICD-10-CM

## 2014-05-29 DIAGNOSIS — Z8719 Personal history of other diseases of the digestive system: Secondary | ICD-10-CM

## 2014-05-29 DIAGNOSIS — W19XXXA Unspecified fall, initial encounter: Secondary | ICD-10-CM | POA: Diagnosis present

## 2014-05-29 DIAGNOSIS — M545 Low back pain: Secondary | ICD-10-CM | POA: Diagnosis not present

## 2014-05-29 LAB — COMPREHENSIVE METABOLIC PANEL
ALK PHOS: 131 U/L — AB (ref 39–117)
ALT: 59 U/L — AB (ref 0–35)
AST: 103 U/L — ABNORMAL HIGH (ref 0–37)
Albumin: 2.9 g/dL — ABNORMAL LOW (ref 3.5–5.2)
Anion gap: 9 (ref 5–15)
BILIRUBIN TOTAL: 1.1 mg/dL (ref 0.3–1.2)
BUN: 38 mg/dL — ABNORMAL HIGH (ref 6–23)
CHLORIDE: 97 mmol/L (ref 96–112)
CO2: 24 mmol/L (ref 19–32)
Calcium: 9 mg/dL (ref 8.4–10.5)
Creatinine, Ser: 0.89 mg/dL (ref 0.50–1.10)
GFR, EST AFRICAN AMERICAN: 71 mL/min — AB (ref >90)
GFR, EST NON AFRICAN AMERICAN: 61 mL/min — AB (ref >90)
GLUCOSE: 350 mg/dL — AB (ref 70–99)
POTASSIUM: 3.5 mmol/L (ref 3.5–5.1)
SODIUM: 130 mmol/L — AB (ref 135–145)
Total Protein: 7.7 g/dL (ref 6.0–8.3)

## 2014-05-29 LAB — URINALYSIS, ROUTINE W REFLEX MICROSCOPIC
Bilirubin Urine: NEGATIVE
Glucose, UA: 100 mg/dL — AB
Ketones, ur: NEGATIVE mg/dL
NITRITE: NEGATIVE
PROTEIN: 100 mg/dL — AB
Specific Gravity, Urine: 1.016 (ref 1.005–1.030)
Urobilinogen, UA: 1 mg/dL (ref 0.0–1.0)
pH: 5.5 (ref 5.0–8.0)

## 2014-05-29 LAB — CBC WITH DIFFERENTIAL/PLATELET
BASOS PCT: 0 % (ref 0–1)
Basophils Absolute: 0.1 10*3/uL (ref 0.0–0.1)
EOS ABS: 0.1 10*3/uL (ref 0.0–0.7)
Eosinophils Relative: 0 % (ref 0–5)
HCT: 38.8 % (ref 36.0–46.0)
HEMOGLOBIN: 13.6 g/dL (ref 12.0–15.0)
Lymphocytes Relative: 6 % — ABNORMAL LOW (ref 12–46)
Lymphs Abs: 1 10*3/uL (ref 0.7–4.0)
MCH: 32.2 pg (ref 26.0–34.0)
MCHC: 35.1 g/dL (ref 30.0–36.0)
MCV: 91.9 fL (ref 78.0–100.0)
MONOS PCT: 9 % (ref 3–12)
Monocytes Absolute: 1.4 10*3/uL — ABNORMAL HIGH (ref 0.1–1.0)
NEUTROS PCT: 85 % — AB (ref 43–77)
Neutro Abs: 13.8 10*3/uL — ABNORMAL HIGH (ref 1.7–7.7)
Platelets: 254 10*3/uL (ref 150–400)
RBC: 4.22 MIL/uL (ref 3.87–5.11)
RDW: 12.6 % (ref 11.5–15.5)
WBC: 16.4 10*3/uL — ABNORMAL HIGH (ref 4.0–10.5)

## 2014-05-29 LAB — TROPONIN I
TROPONIN I: 0.04 ng/mL — AB (ref <0.031)
Troponin I: 0.04 ng/mL — ABNORMAL HIGH (ref <0.031)
Troponin I: 0.05 ng/mL — ABNORMAL HIGH (ref <0.031)

## 2014-05-29 LAB — URINE MICROSCOPIC-ADD ON

## 2014-05-29 LAB — GLUCOSE, CAPILLARY
GLUCOSE-CAPILLARY: 241 mg/dL — AB (ref 70–99)
Glucose-Capillary: 298 mg/dL — ABNORMAL HIGH (ref 70–99)

## 2014-05-29 LAB — CK: CK TOTAL: 994 U/L — AB (ref 7–177)

## 2014-05-29 LAB — TSH: TSH: 1.469 u[IU]/mL (ref 0.350–4.500)

## 2014-05-29 MED ORDER — ONDANSETRON HCL 4 MG/2ML IJ SOLN: 4.0000 mg | Freq: Four times a day (QID) | INTRAMUSCULAR | Status: DC | PRN

## 2014-05-29 MED ORDER — CEFTRIAXONE SODIUM IN DEXTROSE 20 MG/ML IV SOLN
1.0000 g | INTRAVENOUS | Status: DC
Start: 2014-05-30 — End: 2014-06-03
  Administered 2014-05-30 – 2014-06-02 (×3): 1 g via INTRAVENOUS
  Filled 2014-05-29 (×5): qty 50

## 2014-05-29 MED ORDER — SODIUM CHLORIDE 0.9 % IV BOLUS (SEPSIS)
1000.0000 mL | Freq: Once | INTRAVENOUS | Status: AC
  Administered 2014-05-29: 1000 mL via INTRAVENOUS

## 2014-05-29 MED ORDER — AMLODIPINE BESYLATE 5 MG PO TABS
5.0000 mg | ORAL_TABLET | Freq: Every day | ORAL | Status: DC
  Administered 2014-05-29 – 2014-06-03 (×6): 5 mg via ORAL
  Filled 2014-05-29 (×6): qty 1

## 2014-05-29 MED ORDER — FENTANYL CITRATE 0.05 MG/ML IJ SOLN
50.0000 ug | Freq: Once | INTRAMUSCULAR | Status: AC
  Administered 2014-05-29: 50 ug via INTRAVENOUS
  Filled 2014-05-29: qty 2

## 2014-05-29 MED ORDER — ONDANSETRON HCL 4 MG/2ML IJ SOLN
4.0000 mg | Freq: Once | INTRAMUSCULAR | Status: AC
  Administered 2014-05-29: 4 mg via INTRAVENOUS
  Filled 2014-05-29: qty 2

## 2014-05-29 MED ORDER — FUROSEMIDE 20 MG PO TABS: 20.0000 mg | ORAL_TABLET | Freq: Two times a day (BID) | ORAL | Status: DC

## 2014-05-29 MED ORDER — DEXTROSE 5 % IV SOLN
1.0000 g | Freq: Once | INTRAVENOUS | Status: AC
  Administered 2014-05-29: 1 g via INTRAVENOUS
  Filled 2014-05-29: qty 10

## 2014-05-29 MED ORDER — ACETAMINOPHEN 650 MG RE SUPP: 650.0000 mg | Freq: Four times a day (QID) | RECTAL | Status: DC | PRN

## 2014-05-29 MED ORDER — ATORVASTATIN CALCIUM 80 MG PO TABS
80.0000 mg | ORAL_TABLET | Freq: Every day | ORAL | Status: DC
  Administered 2014-05-29 – 2014-06-03 (×6): 80 mg via ORAL
  Filled 2014-05-29 (×6): qty 1

## 2014-05-29 MED ORDER — ACETAMINOPHEN 325 MG PO TABS
650.0000 mg | ORAL_TABLET | Freq: Four times a day (QID) | ORAL | Status: DC | PRN
  Administered 2014-05-29: 650 mg via ORAL
  Filled 2014-05-29: qty 2

## 2014-05-29 MED ORDER — FUROSEMIDE 20 MG PO TABS
20.0000 mg | ORAL_TABLET | Freq: Two times a day (BID) | ORAL | Status: DC
  Administered 2014-05-30: 20 mg via ORAL
  Filled 2014-05-29 (×3): qty 1

## 2014-05-29 MED ORDER — INSULIN ASPART 100 UNIT/ML ~~LOC~~ SOLN
0.0000 [IU] | Freq: Three times a day (TID) | SUBCUTANEOUS | Status: DC
  Administered 2014-05-29: 8 [IU] via SUBCUTANEOUS
  Administered 2014-05-30: 2 [IU] via SUBCUTANEOUS
  Administered 2014-05-30: 5 [IU] via SUBCUTANEOUS
  Administered 2014-05-30: 8 [IU] via SUBCUTANEOUS
  Administered 2014-05-31: 3 [IU] via SUBCUTANEOUS
  Administered 2014-05-31: 15 [IU] via SUBCUTANEOUS
  Administered 2014-05-31: 5 [IU] via SUBCUTANEOUS
  Administered 2014-06-01: 2 [IU] via SUBCUTANEOUS
  Administered 2014-06-01: 3 [IU] via SUBCUTANEOUS
  Administered 2014-06-01: 5 [IU] via SUBCUTANEOUS
  Administered 2014-06-02: 3 [IU] via SUBCUTANEOUS
  Administered 2014-06-02: 2 [IU] via SUBCUTANEOUS
  Administered 2014-06-03: 8 [IU] via SUBCUTANEOUS

## 2014-05-29 MED ORDER — METOPROLOL TARTRATE 25 MG PO TABS
25.0000 mg | ORAL_TABLET | Freq: Two times a day (BID) | ORAL | Status: DC
  Administered 2014-05-29 – 2014-06-03 (×10): 25 mg via ORAL
  Filled 2014-05-29 (×11): qty 1

## 2014-05-29 MED ORDER — INSULIN NPH (HUMAN) (ISOPHANE) 100 UNIT/ML ~~LOC~~ SUSP
25.0000 [IU] | Freq: Two times a day (BID) | SUBCUTANEOUS | Status: DC
  Administered 2014-05-29 – 2014-05-31 (×4): 25 [IU] via SUBCUTANEOUS
  Filled 2014-05-29 (×2): qty 10

## 2014-05-29 MED ORDER — SODIUM CHLORIDE 0.9 % IV SOLN
INTRAVENOUS | Status: AC
  Administered 2014-05-29: 16:00:00 via INTRAVENOUS

## 2014-05-29 MED ORDER — OXYCODONE HCL 5 MG PO TABS
5.0000 mg | ORAL_TABLET | ORAL | Status: DC | PRN
  Administered 2014-06-03: 5 mg via ORAL
  Filled 2014-05-29: qty 1

## 2014-05-29 MED ORDER — SODIUM CHLORIDE 0.9 % IJ SOLN
3.0000 mL | Freq: Two times a day (BID) | INTRAMUSCULAR | Status: DC
  Administered 2014-05-29 – 2014-06-03 (×8): 3 mL via INTRAVENOUS

## 2014-05-29 MED ORDER — ONDANSETRON HCL 4 MG PO TABS: 4.0000 mg | ORAL_TABLET | Freq: Four times a day (QID) | ORAL | Status: DC | PRN

## 2014-05-29 MED ORDER — ASPIRIN 81 MG PO CHEW
81.0000 mg | CHEWABLE_TABLET | Freq: Every day | ORAL | Status: DC
  Administered 2014-05-29 – 2014-05-31 (×3): 81 mg via ORAL
  Filled 2014-05-29 (×4): qty 1

## 2014-05-29 MED ORDER — INSULIN ASPART 100 UNIT/ML ~~LOC~~ SOLN
0.0000 [IU] | Freq: Every day | SUBCUTANEOUS | Status: DC
  Administered 2014-05-29: 2 [IU] via SUBCUTANEOUS
  Administered 2014-05-30 – 2014-06-02 (×2): 3 [IU] via SUBCUTANEOUS

## 2014-05-29 MED ORDER — HEPARIN SODIUM (PORCINE) 5000 UNIT/ML IJ SOLN
5000.0000 [IU] | Freq: Three times a day (TID) | INTRAMUSCULAR | Status: DC
  Administered 2014-05-29 – 2014-05-31 (×5): 5000 [IU] via SUBCUTANEOUS
  Filled 2014-05-29 (×6): qty 1

## 2014-05-29 MED ORDER — LOSARTAN POTASSIUM 50 MG PO TABS
50.0000 mg | ORAL_TABLET | Freq: Two times a day (BID) | ORAL | Status: DC
  Administered 2014-05-29 – 2014-05-30 (×3): 50 mg via ORAL
  Filled 2014-05-29 (×4): qty 1

## 2014-05-29 MED ORDER — DOCUSATE SODIUM 100 MG PO CAPS
100.0000 mg | ORAL_CAPSULE | Freq: Two times a day (BID) | ORAL | Status: DC
  Administered 2014-05-29 – 2014-06-03 (×10): 100 mg via ORAL
  Filled 2014-05-29 (×11): qty 1

## 2014-05-29 NOTE — ED Notes (Signed)
Bed: [id number] Expected date:  Expected time:  Means of arrival:  Comments: Ems- foot pain

## 2014-05-29 NOTE — ED Notes (Signed)
Report given to Sherlynn Stalls, RN on [facility]--will transport shortly.

## 2014-05-29 NOTE — ED Provider Notes (Addendum)
CSN: [id number]     Arrival date & time 05/29/14  [id number] History   First MD Initiated Contact with Patient 05/29/14 1005     Chief Complaint  Patient presents with  . Ankle Pain     (Consider location/radiation/quality/duration/timing/severity/associated sxs/prior Treatment) HPI Comments: Elderly 79 year old female presents with a complaint of right ankle pain, this is complicated by her recent week of feeling nauseated, throwing up "bile". She has had severe hyperglycemia which was too high to read yesterday but has improved today to around 300. She states that when she stands and walks she gets for a lightheaded and feels as though she will pass out. She has noted that her urine has been cloudy, frequent and has been associated with burning when she urinates. She denies back pain chest pain shortness of breath blurred vision fevers chills coughing or swelling of the lower extremities; other than her right ankle which is swollen after her fall last night. She was unable to get up and has been on the floor most of the evening. The family reports that she has had several falls this week, they did not resultant injuries but she has been off balance. The symptoms are progressive, gradually worsening, nothing makes this better or worse. She had several days where she was unable to take her medications because of nausea.  Patient is a 79 y.o. female presenting with ankle pain. The history is provided by the patient, the EMS personnel and a relative.  Ankle Pain   Past Medical History  Diagnosis Date  . CAD (coronary artery disease)   . Obesity   . Atrial fibrillation   . Hypertension   . Diabetes mellitus   . Hyperlipidemia   . Peripheral vascular disease   . GI bleed 1/11  . Carotid artery occlusion   . Normal nuclear stress test 03/18/2011  . Angina   . GERD (gastroesophageal reflux disease)   . Arthritis   . Fibromyalgia   . Myocardial infarction ~ 1991  . UTI (lower urinary tract  infection) Sept. 2015   Past Surgical History  Procedure Laterality Date  . Trigger finger release  1996    right thumb  . Cataract extraction w/ intraocular lens  implant, bilateral  2004-2005  . Coronary artery bypass graft  2000    CABG X5  . Peripheral arterial stent graft  04/01/11    right common iliac  . Angioplasty  06/17/11    Left leg common femoral artery cannulation under u/s Left leg runoff  . Eye surgery    . Abdominal aortagram N/A 04/01/2011    Procedure: ABDOMINAL AORTAGRAM;  Surgeon: Conrad [place], MD;  Location: [facility] CATH LAB;  Service: Cardiovascular;  Laterality: N/A;  . Percutaneous stent intervention Right 04/01/2011    Procedure: PERCUTANEOUS STENT INTERVENTION;  Surgeon: Conrad [place], MD;  Location: [facility] CATH LAB;  Service: Cardiovascular;  Laterality: Right;  rt iliac stent  . Lower extremity angiogram Bilateral 04/01/2011    Procedure: LOWER EXTREMITY ANGIOGRAM;  Surgeon: Conrad [place], MD;  Location: [facility] CATH LAB;  Service: Cardiovascular;  Laterality: Bilateral;  . Lower extremity angiogram Left 06/17/2011    Procedure: LOWER EXTREMITY ANGIOGRAM;  Surgeon: Conrad [place], MD;  Location: [facility] CATH LAB;  Service: Cardiovascular;  Laterality: Left;  . Lower extremity angiogram N/A 11/18/2011    Procedure: LOWER EXTREMITY ANGIOGRAM;  Surgeon: Conrad [place], MD;  Location: [facility] CATH LAB;  Service: Cardiovascular;  Laterality: N/A;   Family History  Problem  Relation Age of Onset  . Other Brother     intestinal blockage   History  Substance Use Topics  . Smoking status: Former Smoker -- 2.00 packs/day for 30 years    Types: Cigarettes  . Smokeless tobacco: Never Used     Comment: stopped smoking cigarettes 1991  . Alcohol Use: No   OB History    No data available     Review of Systems  All other systems reviewed and are negative.     Allergies  Morphine and related; Lisinopril; Sertraline hcl; and Zoloft  Home Medications   Prior to Admission medications    Medication Sig Start Date End Date Taking? Authorizing Provider  acetaminophen (TYLENOL) 325 MG tablet Take 2 tablets (650 mg total) by mouth every 6 (six) hours as needed for mild pain (or Fever >/= 101). 08/27/13  Yes Theodis Blaze, MD  amLODipine (NORVASC) 5 MG tablet Take 1 tablet by mouth daily. 03/06/12  Yes Historical Provider, MD  aspirin 81 MG tablet Take 81 mg by mouth daily.   Yes Historical Provider, MD  atorvastatin (LIPITOR) 80 MG tablet Take 1 tablet (80 mg total) by mouth daily. 11/27/12  Yes Freeman Caldron Baker, PA-C  furosemide (LASIX) 40 MG tablet Take 20-60 mg by mouth 2 (two) times daily. Pt takes 1.5 tabs (60mg ) in the morning and 1/2 tablet (20mg ) in the evening   Yes Historical Provider, MD  HUMULIN R 100 UNIT/ML injection Inject 5 Units into the skin at bedtime.  01/23/14  Yes Historical Provider, MD  insulin NPH Human (HUMULIN N,NOVOLIN N) 100 UNIT/ML injection Inject 37 Units into the skin 2 (two) times daily. 38 in am and 34 at night 11/27/12  Yes Liam Graham, PA-C  losartan (COZAAR) 50 MG tablet Take 50 mg by mouth 2 (two) times daily.     Yes Historical Provider, MD  metoprolol tartrate (LOPRESSOR) 25 MG tablet Take 1 tablet by mouth 2 (two) times daily. 04/16/14  Yes Historical Provider, MD  Multiple Vitamins-Minerals (ONE-A-DAY 50 PLUS PO) Take 1 tablet by mouth daily.   Yes Historical Provider, MD  NITROSTAT 0.4 MG SL tablet Place 1 tablet under the tongue every 5 (five) minutes x 3 doses as needed. Chest pain 03/07/14  Yes Historical Provider, MD  sulfamethoxazole-trimethoprim (BACTRIM DS) 800-160 MG per tablet Take 1 tablet by mouth every 12 (twelve) hours. Patient not taking: Reported on 04/18/2014 08/27/13   Theodis Blaze, MD   BP 119/60 mmHg  Pulse 91  Temp(Src) 97.6 F (36.4 C) (Oral)  Resp 17  SpO2 95% Physical Exam  Constitutional: She appears well-developed and well-nourished. No distress.  HENT:  Head: Normocephalic and atraumatic.  Mouth/Throat:  Oropharynx is clear and moist. No oropharyngeal exudate.  Eyes: Conjunctivae and EOM are normal. Pupils are equal, round, and reactive to light. Right eye exhibits no discharge. Left eye exhibits no discharge. No scleral icterus.  Neck: Normal range of motion. Neck supple. No JVD present. No thyromegaly present.  Cardiovascular: Regular rhythm, normal heart sounds and intact distal pulses.  Exam reveals no gallop and no friction rub.   No murmur heard. Mild tachycardia  Pulmonary/Chest: Effort normal and breath sounds normal. No respiratory distress. She has no wheezes. She has no rales.  Abdominal: Soft. Bowel sounds are normal. She exhibits no distension and no mass. There is no tenderness.  No abdominal tenderness to palpation  Musculoskeletal: Normal range of motion. She exhibits tenderness. She exhibits no edema.  Swelling  of the right ankle, decreased range of motion of the right ankle and less so of the right hip. Able to straight leg raise bilaterally.  Lymphadenopathy:    She has no cervical adenopathy.  Neurological: She is alert. Coordination normal.  Skin: Skin is warm and dry. No rash noted. No erythema.  Psychiatric: She has a normal mood and affect. Her behavior is normal.  Nursing note and vitals reviewed.   ED Course  Procedures (including critical care time) Labs Review Labs Reviewed  COMPREHENSIVE METABOLIC PANEL - Abnormal; Notable for the following:    Sodium 130 (*)    Glucose, Bld 350 (*)    BUN 38 (*)    Albumin 2.9 (*)    AST 103 (*)    ALT 59 (*)    Alkaline Phosphatase 131 (*)    GFR calc non Af Amer 61 (*)    GFR calc Af Amer 71 (*)    All other components within normal limits  CBC WITH DIFFERENTIAL/PLATELET - Abnormal; Notable for the following:    WBC 16.4 (*)    Neutrophils Relative % 85 (*)    Neutro Abs 13.8 (*)    Lymphocytes Relative 6 (*)    Monocytes Absolute 1.4 (*)    All other components within normal limits  TROPONIN I - Abnormal;  Notable for the following:    Troponin I 0.04 (*)    All other components within normal limits  URINALYSIS, ROUTINE W REFLEX MICROSCOPIC - Abnormal; Notable for the following:    APPearance TURBID (*)    Glucose, UA 100 (*)    Hgb urine dipstick LARGE (*)    Protein, ur 100 (*)    Leukocytes, UA LARGE (*)    All other components within normal limits  CK - Abnormal; Notable for the following:    Total CK 994 (*)    All other components within normal limits  URINE MICROSCOPIC-ADD ON - Abnormal; Notable for the following:    Bacteria, UA MANY (*)    Casts GRANULAR CAST (*)    All other components within normal limits    Imaging Review Dg Ankle Complete Right  05/29/2014   CLINICAL DATA:  Fall in bathroom with right ankle pain. Initial encounter  EXAM: RIGHT ANKLE - COMPLETE 3+ VIEW  COMPARISON:  None.  FINDINGS: Nondisplaced oblique fracture of the distal fibula, above the ankle joint. There is no medial malleolus fracture or ankle mortise widening.  Osteopenia.  IMPRESSION: Nondisplaced distal fibula fracture.   Electronically Signed   By: Monte Fantasia M.D.   On: 05/29/2014 11:52   Dg Hip Unilat With Pelvis 2-3 Views Right  05/29/2014   CLINICAL DATA:  Fall in bathroom with right hip and ankle pain. Initial encounter  EXAM: RIGHT HIP (WITH PELVIS) 2-3 VIEWS  COMPARISON:  None.  FINDINGS: When accounting for marginal spurring, there is no evidence of femoral neck or other hip fracture. The hips are located. There is mild degenerative spurring about both hips without asymmetric joint narrowing. No evidence of pelvic ring fracture or diastasis.  IMPRESSION: No acute osseous findings.   Electronically Signed   By: Monte Fantasia M.D.   On: 05/29/2014 11:54     EKG Interpretation   Date/Time:  Wednesday May 29 2014 12:38:44 EST Ventricular Rate:  97 PR Interval:    QRS Duration: 104 QT Interval:  343 QTC Calculation: 436 R Axis:   88 Text Interpretation:  Atrial fibrillation  Borderline right axis deviation  Borderline low voltage, extremity leads Probable anteroseptal infarct, old  Borderline repolarization abnormality Abnormal ekg since last tracing no  significant change Confirmed by Aws Shere  MD, Laverle Pillard ([id number]) on 05/29/2014  1:09:53 PM      MDM   Final diagnoses:  Fall  UTI (lower urinary tract infection)  Fracture of distal end of right fibula    The patient appears slightly dehydrated. She is mildly tachycardic but not hypotensive or febrile. Her urine does appear very cloudy. There is concern for the infection as well as DKA and dehydration causing the patient's lightheadedness and frequent falls. Imaging to rule out fracture of the hip and the ankle, labs, fluids, supportive care. She does have a history of CABG approximately 16 years ago, she has no chest pain or shortness of breath.  D/w Dr. [place] Pink who wil admit,  Xray confirms frx,  UA confirms UTI, VS remain stable,  D/w dr. [name] who will see from Orthopedics  Meds given in ED:  Medications  sodium chloride 0.9 % bolus 1,000 mL (0 mLs Intravenous Stopped 05/29/14 1255)  ondansetron (ZOFRAN) injection 4 mg (4 mg Intravenous Given 05/29/14 1105)  fentaNYL (SUBLIMAZE) injection 50 mcg (50 mcg Intravenous Given 05/29/14 1105)  cefTRIAXone (ROCEPHIN) 1 g in dextrose 5 % 50 mL IVPB (0 g Intravenous Stopped 05/29/14 1256)    New Prescriptions   No medications on file      Johnna Acosta, MD 05/29/14 1310  Johnna Acosta, MD 05/29/14 1326

## 2014-05-29 NOTE — ED Notes (Signed)
Pt reports fell walking to bathroom last night. Stayed on floor all night, pain to right ankle. Swelling noted. No LOC. No blood thinners. Did not hit head.

## 2014-05-29 NOTE — H&P (Signed)
[facility] History and Physical  Kristina Summers TSV:[id number] DOB: [birth year] DOA: 05/29/2014   PCP: [name], MD  Specialists: Patient is followed by Dr. [name] with cardiology  Chief Complaint: Fall  HPI: Kristina Summers is a 79 y.o. female with a past medical history of coronary artery disease status post CABG, atrial fibrillation not on anticoagulation, history of arthritis, diabetes, on insulin. Has been feeling off balance for the last few days. She may have had a few falls. She presented this morning via EMS. She was found lying on the floor. She tells me that yesterday evening she went to her bathroom as usual, using her walker. And she made a quick turn and lost her balance and fell down. She fell on her back as well as the right ankle. She heard something click in the right ankle. She experienced excruciating pain. She denies any syncopal episode. No chest pain, shortness of breath. 3 days ago she had nausea and vomiting which has resolved. Denies any abdominal pain. No fever, no chills. Has been having some cloudy urine over the last few days. She couldn't get up after her fall. This morning a concerned neighbor called maintenance. She was found on the floor. EMS was called and she was transported to the hospital. Currently, pain is 8 out of 10 in intensityonly when the right ankle is moved. She also complains of some pain in the lower back in the coccyx area.  Home Medications: Prior to Admission medications   Medication Sig Start Date End Date Taking? Authorizing Provider  acetaminophen (TYLENOL) 325 MG tablet Take 2 tablets (650 mg total) by mouth every 6 (six) hours as needed for mild pain (or Fever >/= 101). 08/27/13  Yes Theodis Blaze, MD  amLODipine (NORVASC) 5 MG tablet Take 1 tablet by mouth daily. 03/06/12  Yes Historical Provider, MD  aspirin 81 MG tablet Take 81 mg by mouth daily.   Yes Historical Provider, MD  atorvastatin (LIPITOR) 80 MG tablet Take 1 tablet (80 mg total)  by mouth daily. 11/27/12  Yes Freeman Caldron Baker, PA-C  furosemide (LASIX) 40 MG tablet Take 20-60 mg by mouth 2 (two) times daily. Pt takes 1.5 tabs ($RemoveB'60mg'LozJanuH$ ) in the morning and 1/2 tablet ($RemoveBef'20mg'EhpMHZwfxJ$ ) in the evening   Yes Historical Provider, MD  HUMULIN R 100 UNIT/ML injection Inject 5 Units into the skin at bedtime.  01/23/14  Yes Historical Provider, MD  insulin NPH Human (HUMULIN N,NOVOLIN N) 100 UNIT/ML injection Inject 37 Units into the skin 2 (two) times daily. 38 in am and 34 at night 11/27/12  Yes Liam Graham, PA-C  losartan (COZAAR) 50 MG tablet Take 50 mg by mouth 2 (two) times daily.     Yes Historical Provider, MD  metoprolol tartrate (LOPRESSOR) 25 MG tablet Take 1 tablet by mouth 2 (two) times daily. 04/16/14  Yes Historical Provider, MD  Multiple Vitamins-Minerals (ONE-A-DAY 50 PLUS PO) Take 1 tablet by mouth daily.   Yes Historical Provider, MD  NITROSTAT 0.4 MG SL tablet Place 1 tablet under the tongue every 5 (five) minutes x 3 doses as needed. Chest pain 03/07/14  Yes Historical Provider, MD  sulfamethoxazole-trimethoprim (BACTRIM DS) 800-160 MG per tablet Take 1 tablet by mouth every 12 (twelve) hours. Patient not taking: Reported on 04/18/2014 08/27/13   Theodis Blaze, MD    Allergies:  Allergies  Allergen Reactions  . Morphine And Related Nausea And Vomiting    Chest pain    . Lisinopril Cough  .  Sertraline Hcl     REACTION: nausea  . Zoloft [Sertraline Hcl] Other (See Comments)    Nausea     Past Medical History: Past Medical History  Diagnosis Date  . CAD (coronary artery disease)   . Obesity   . Atrial fibrillation   . Hypertension   . Diabetes mellitus   . Hyperlipidemia   . Peripheral vascular disease   . GI bleed 1/11  . Carotid artery occlusion   . Normal nuclear stress test 03/18/2011  . Angina   . GERD (gastroesophageal reflux disease)   . Arthritis   . Fibromyalgia   . Myocardial infarction ~ 1991  . UTI (lower urinary tract infection) Sept. 2015     Past Surgical History  Procedure Laterality Date  . Trigger finger release  1996    right thumb  . Cataract extraction w/ intraocular lens  implant, bilateral  2004-2005  . Coronary artery bypass graft  2000    CABG X5  . Peripheral arterial stent graft  04/01/11    right common iliac  . Angioplasty  06/17/11    Left leg common femoral artery cannulation under u/s Left leg runoff  . Eye surgery    . Abdominal aortagram N/A 04/01/2011    Procedure: ABDOMINAL AORTAGRAM;  Surgeon: Conrad Shackelford, MD;  Location: [facility] CATH LAB;  Service: Cardiovascular;  Laterality: N/A;  . Percutaneous stent intervention Right 04/01/2011    Procedure: PERCUTANEOUS STENT INTERVENTION;  Surgeon: Conrad Sauk, MD;  Location: [facility] CATH LAB;  Service: Cardiovascular;  Laterality: Right;  rt iliac stent  . Lower extremity angiogram Bilateral 04/01/2011    Procedure: LOWER EXTREMITY ANGIOGRAM;  Surgeon: Conrad Souderton, MD;  Location: [facility] CATH LAB;  Service: Cardiovascular;  Laterality: Bilateral;  . Lower extremity angiogram Left 06/17/2011    Procedure: LOWER EXTREMITY ANGIOGRAM;  Surgeon: Conrad [place], MD;  Location: [facility] CATH LAB;  Service: Cardiovascular;  Laterality: Left;  . Lower extremity angiogram N/A 11/18/2011    Procedure: LOWER EXTREMITY ANGIOGRAM;  Surgeon: Conrad Pray, MD;  Location: [facility] CATH LAB;  Service: Cardiovascular;  Laterality: N/A;    Social History: She lives in [place] in a retirement community. Denies smoking currently. She quit in 1992. No alcohol use. No illicit drug use. Uses a walker to ambulate.  Family History:  Family History  Problem Relation Age of Onset  . Other Brother     intestinal blockage  . Diabetes Brother      Review of Systems - History obtained from the patient General ROS: positive for  - fatigue Psychological ROS: negative Ophthalmic ROS: negative ENT ROS: negative Allergy and Immunology ROS: negative Hematological and Lymphatic ROS: negative Endocrine ROS:  negative Respiratory ROS: no cough, shortness of breath, or wheezing Cardiovascular ROS: no chest pain or dyspnea on exertion Gastrointestinal ROS: no abdominal pain, change in bowel habits, or black or bloody stools Genito-Urinary ROS: as in hpi Musculoskeletal ROS: as in hpi Neurological ROS: no TIA or stroke symptoms Dermatological ROS: negative  Physical Examination  Filed Vitals:   05/29/14 1006 05/29/14 1155  BP: 131/83 119/60  Pulse: 107 91  Temp: 97.6 F (36.4 C)   TempSrc: Oral   Resp: 16 17  SpO2: 95% 95%    BP 119/60 mmHg  Pulse 91  Temp(Src) 97.6 F (36.4 C) (Oral)  Resp 17  SpO2 95%  General appearance: alert, cooperative, appears stated age, no distress and moderately obese Head: Normocephalic, without obvious abnormality, atraumatic Eyes: conjunctivae/corneas  clear. PERRL, EOM's intact.  Throat: Dry mucous membranes Neck: no adenopathy, no carotid bruit, no JVD, supple, symmetrical, trachea midline and thyroid not enlarged, symmetric, no tenderness/mass/nodules Back: Tender over the coccyx area. No bruising. Resp: clear to auscultation bilaterally Cardio: S1, S2 is irregularly irregular. No S3, S4. No rubs, or bruit. Systolic murmur appreciated over the precordium. GI: soft, non-tender; bowel sounds normal; no masses,  no organomegaly Extremities: Limited mobility of the right ankle. She has good mobility of the left lower extremity and was able to lift the leg off the bed. Somewhat limited in the right side. the right hip was examined. No tenderness was noted. Able to move both her arms without any difficulties. Good range of motion in both the shoulders as well as elbow joints. Pulses: Poorly palpable in the lower extremities. Skin: Skin color, texture, turgor normal. No rashes or lesions Neurologic: Alert and oriented 3. Cranial nerves II-12 intact. Strength equal bilateral upper extremities. Difficult to assess right lower extremity due to her  injury.  Laboratory Data: Results for orders placed or performed during the hospital encounter of 05/29/14 (from the past 48 hour(s))  Urinalysis, Routine w reflex microscopic     Status: Abnormal   Collection Time: 05/29/14 10:52 AM  Result Value Ref Range   Color, Urine YELLOW YELLOW   APPearance TURBID (A) CLEAR   Specific Gravity, Urine 1.016 1.005 - 1.030   pH 5.5 5.0 - 8.0   Glucose, UA 100 (A) NEGATIVE mg/dL   Hgb urine dipstick LARGE (A) NEGATIVE   Bilirubin Urine NEGATIVE NEGATIVE   Ketones, ur NEGATIVE NEGATIVE mg/dL   Protein, ur 100 (A) NEGATIVE mg/dL   Urobilinogen, UA 1.0 0.0 - 1.0 mg/dL   Nitrite NEGATIVE NEGATIVE   Leukocytes, UA LARGE (A) NEGATIVE  Urine microscopic-add on     Status: Abnormal   Collection Time: 05/29/14 10:52 AM  Result Value Ref Range   WBC, UA 21-50 <3 WBC/hpf   RBC / HPF 3-6 <3 RBC/hpf   Bacteria, UA MANY (A) RARE   Casts GRANULAR CAST (A) NEGATIVE   Urine-Other AMORPHOUS URATES/PHOSPHATES     Comment: WBC CLUMPS  Comprehensive metabolic panel     Status: Abnormal   Collection Time: 05/29/14 11:10 AM  Result Value Ref Range   Sodium 130 (L) 135 - 145 mmol/L   Potassium 3.5 3.5 - 5.1 mmol/L   Chloride 97 96 - 112 mmol/L   CO2 24 19 - 32 mmol/L   Glucose, Bld 350 (H) 70 - 99 mg/dL   BUN 38 (H) 6 - 23 mg/dL   Creatinine, Ser 0.89 0.50 - 1.10 mg/dL   Calcium 9.0 8.4 - 10.5 mg/dL   Total Protein 7.7 6.0 - 8.3 g/dL   Albumin 2.9 (L) 3.5 - 5.2 g/dL   AST 103 (H) 0 - 37 U/L   ALT 59 (H) 0 - 35 U/L   Alkaline Phosphatase 131 (H) 39 - 117 U/L   Total Bilirubin 1.1 0.3 - 1.2 mg/dL   GFR calc non Af Amer 61 (L) >90 mL/min   GFR calc Af Amer 71 (L) >90 mL/min    Comment: (NOTE) The eGFR has been calculated using the CKD EPI equation. This calculation has not been validated in all clinical situations. eGFR's persistently <90 mL/min signify possible Chronic Kidney Disease.    Anion gap 9 5 - 15  CBC with Differential     Status: Abnormal    Collection Time: 05/29/14 11:10 AM  Result Value  Ref Range   WBC 16.4 (H) 4.0 - 10.5 K/uL   RBC 4.22 3.87 - 5.11 MIL/uL   Hemoglobin 13.6 12.0 - 15.0 g/dL   HCT 38.8 36.0 - 46.0 %   MCV 91.9 78.0 - 100.0 fL   MCH 32.2 26.0 - 34.0 pg   MCHC 35.1 30.0 - 36.0 g/dL   RDW 12.6 11.5 - 15.5 %   Platelets 254 150 - 400 K/uL   Neutrophils Relative % 85 (H) 43 - 77 %   Neutro Abs 13.8 (H) 1.7 - 7.7 K/uL   Lymphocytes Relative 6 (L) 12 - 46 %   Lymphs Abs 1.0 0.7 - 4.0 K/uL   Monocytes Relative 9 3 - 12 %   Monocytes Absolute 1.4 (H) 0.1 - 1.0 K/uL   Eosinophils Relative 0 0 - 5 %   Eosinophils Absolute 0.1 0.0 - 0.7 K/uL   Basophils Relative 0 0 - 1 %   Basophils Absolute 0.1 0.0 - 0.1 K/uL  Troponin I     Status: Abnormal   Collection Time: 05/29/14 11:10 AM  Result Value Ref Range   Troponin I 0.04 (H) <0.031 ng/mL    Comment:        PERSISTENTLY INCREASED TROPONIN VALUES IN THE RANGE OF 0.04-0.49 ng/mL CAN BE SEEN IN:       -UNSTABLE ANGINA       -CONGESTIVE HEART FAILURE       -MYOCARDITIS       -CHEST TRAUMA       -ARRYHTHMIAS       -LATE PRESENTING MYOCARDIAL INFARCTION       -COPD   CLINICAL FOLLOW-UP RECOMMENDED.   CK     Status: Abnormal   Collection Time: 05/29/14 11:10 AM  Result Value Ref Range   Total CK 994 (H) 7 - 177 U/L    Radiology Reports: Dg Ankle Complete Right  05/29/2014   CLINICAL DATA:  Fall in bathroom with right ankle pain. Initial encounter  EXAM: RIGHT ANKLE - COMPLETE 3+ VIEW  COMPARISON:  None.  FINDINGS: Nondisplaced oblique fracture of the distal fibula, above the ankle joint. There is no medial malleolus fracture or ankle mortise widening.  Osteopenia.  IMPRESSION: Nondisplaced distal fibula fracture.   Electronically Signed   By: Monte Fantasia M.D.   On: 05/29/2014 11:52   Dg Hip Unilat With Pelvis 2-3 Views Right  05/29/2014   CLINICAL DATA:  Fall in bathroom with right hip and ankle pain. Initial encounter  EXAM: RIGHT HIP (WITH PELVIS)  2-3 VIEWS  COMPARISON:  None.  FINDINGS: When accounting for marginal spurring, there is no evidence of femoral neck or other hip fracture. The hips are located. There is mild degenerative spurring about both hips without asymmetric joint narrowing. No evidence of pelvic ring fracture or diastasis.  IMPRESSION: No acute osseous findings.   Electronically Signed   By: Monte Fantasia M.D.   On: 05/29/2014 11:54    Electrocardiogram: Atrial fibrillation at 97 bpm. Normal axis. T inversion noted in inf leads. Nonspecific ST changes. Interventricular conduction delay. These changes are noted to be present in previous EKGs as well.  Problem List  Principal Problem:   UTI (lower urinary tract infection) Active Problems:   Atherosclerosis of native arteries of extremity with intermittent claudication   CAD (coronary artery disease)   Paroxysmal atrial fibrillation   Diabetes mellitus type 2 with peripheral artery disease   Obesity (BMI 30-39.9)   Fracture of distal end of right  fibula   Assessment: This is a 79 year old Caucasian female presents after a mechanical fall sustaining a right distal fibula fracture. She also has UTI, which could explain her weakness and imbalance in the last few days. He also has evidence for mild rhabdomyolysis. Elevated liver function tests, probably due to the rhabdomyolysis.  Plan: #1 urinary tract infection: Urine cultures will be sent. Continue with ceftriaxone.  #2. Right distal fibula fracture: Orthopedics to see. Activity level per Ortho. We will need to involve PT and OT once cleared by ortho. Pain control.  #3 atrial fibrillation with mild RVR: Monitor on telemetry. Resume her beta blocker. She is not on anticoagulation due to a history of bleeding according to the patient. Continue with aspirin.  #4 history of coronary artery disease, status post CABG: Stable. EKG does not show any ischemic changes. Troponin was mildly elevated. This will be repeated.  Don't anticipate further workup unless troponin rises significantly.  #5. Diabetes mellitus type 2 with peripheral artery disease and poorly controlled: Continue with her insulin coverage, but at reduced dose. Sliding scale coverage will be initiated. HbA1c will be checked.  #6 history of peripheral vascular disease: Stable. She follows with Dr. Bridgett Larsson.  #7 Transaminitis: Most likely secondary to rhabdomyolysis. Monitor liver function tests closely. May require further workup if improvement is not as anticipated.  #8 Mild rhabdomyolysis: Gentle IV hydration. Repeat CK level in the morning.  #9 Hyponatremia: Some of this is due to secondary to elevated blood glucose. Gentle IV hydration. Repeat levels in the morning.   DVT Prophylaxis: Heparin Code Status: Full code Family Communication: Discussed with the patient  Disposition Plan: Admit to telemetry   Further management decisions will depend on results of further testing and patient's response to treatment.   [facility]  [facility] Pager [phone]  If 7PM-7AM, please contact night-coverage [email or web address] Password [email or web address]  05/29/2014, 1:47 PM

## 2014-05-29 NOTE — ED Notes (Signed)
Quinton, our ortho. Tech. Is here applying a post. Splint on right lower leg/foot/ankle; thence will transport to [facility] [place].

## 2014-05-29 NOTE — Consult Note (Signed)
Reason for Consult: Right ankle fracture Referring Physician:  Maryland Pink, MD  Kristina Summers is an 79 y.o. female.  HPI: Kristina Summers is a 79 y.o. female with a past medical history of coronary artery disease status post CABG, atrial fibrillation not on anticoagulation, history of arthritis, diabetes, on insulin. Has been feeling off balance for the last few days. She may have had a few falls. She presented this morning via EMS. She was found lying on the floor. She tells me that yesterday evening she went to her bathroom as usual, using her walker. And she made a quick turn and lost her balance and fell down. She fell on her back as well as the right ankle. She heard something click in the right ankle. She experienced excruciating pain. She denies any syncopal episode. No chest pain, shortness of breath. 3 days ago she had nausea and vomiting which has resolved. Denies any abdominal pain. No fever, no chills. Has been having some cloudy urine over the last few days. She couldn't get up after her fall. This morning a concerned neighbor called maintenance. She was found on the floor. EMS was called and she was transported to the hospital. Currently, pain is 8 out of 10 in intensityonly when the right ankle is moved. She also complains of some pain in the lower back in the coccyx area.  Other than above no other complaints, particularly around hips and upper extremities  Past Medical History  Diagnosis Date  . CAD (coronary artery disease)   . Obesity   . Atrial fibrillation   . Hypertension   . Diabetes mellitus   . Hyperlipidemia   . Peripheral vascular disease   . GI bleed 1/11  . Carotid artery occlusion   . Normal nuclear stress test 03/18/2011  . Angina   . GERD (gastroesophageal reflux disease)   . Arthritis   . Fibromyalgia   . Myocardial infarction ~ 1991  . UTI (lower urinary tract infection) Sept. 2015    Past Surgical History  Procedure Laterality Date  . Trigger finger release   1996    right thumb  . Cataract extraction w/ intraocular lens  implant, bilateral  2004-2005  . Coronary artery bypass graft  2000    CABG X5  . Peripheral arterial stent graft  04/01/11    right common iliac  . Angioplasty  06/17/11    Left leg common femoral artery cannulation under u/s Left leg runoff  . Eye surgery    . Abdominal aortagram N/A 04/01/2011    Procedure: ABDOMINAL AORTAGRAM;  Surgeon: Conrad Gilman, MD;  Location: [facility] CATH LAB;  Service: Cardiovascular;  Laterality: N/A;  . Percutaneous stent intervention Right 04/01/2011    Procedure: PERCUTANEOUS STENT INTERVENTION;  Surgeon: Conrad [place], MD;  Location: [facility] CATH LAB;  Service: Cardiovascular;  Laterality: Right;  rt iliac stent  . Lower extremity angiogram Bilateral 04/01/2011    Procedure: LOWER EXTREMITY ANGIOGRAM;  Surgeon: Conrad Woodburn, MD;  Location: [facility] CATH LAB;  Service: Cardiovascular;  Laterality: Bilateral;  . Lower extremity angiogram Left 06/17/2011    Procedure: LOWER EXTREMITY ANGIOGRAM;  Surgeon: Conrad [place], MD;  Location: [facility] CATH LAB;  Service: Cardiovascular;  Laterality: Left;  . Lower extremity angiogram N/A 11/18/2011    Procedure: LOWER EXTREMITY ANGIOGRAM;  Surgeon: Conrad , MD;  Location: [facility] CATH LAB;  Service: Cardiovascular;  Laterality: N/A;    Family History  Problem Relation Age of Onset  . Other Brother  intestinal blockage  . Diabetes Brother     Social History:  reports that she has quit smoking. Her smoking use included Cigarettes. She has a 60 pack-year smoking history. She has never used smokeless tobacco. She reports that she does not drink alcohol or use illicit drugs.  Allergies:  Allergies  Allergen Reactions  . Morphine And Related Nausea And Vomiting    Chest pain    . Lisinopril Cough  . Sertraline Hcl     REACTION: nausea  . Zoloft [Sertraline Hcl] Other (See Comments)    Nausea     Medications:  I have reviewed the patient's current  medications. Scheduled: . amLODipine  5 mg Oral Daily  . aspirin  81 mg Oral Daily  . atorvastatin  80 mg Oral Daily  . [START ON 05/30/2014] cefTRIAXone (ROCEPHIN)  IV  1 g Intravenous Q24H  . docusate sodium  100 mg Oral BID  . [START ON 05/30/2014] furosemide  20-60 mg Oral BID  . heparin  5,000 Units Subcutaneous 3 times per day  . insulin aspart  0-15 Units Subcutaneous TID WC  . insulin aspart  0-5 Units Subcutaneous QHS  . insulin NPH Human  25 Units Subcutaneous BID  . losartan  50 mg Oral BID  . metoprolol tartrate  25 mg Oral BID  . sodium chloride  3 mL Intravenous Q12H    Results for orders placed or performed during the hospital encounter of 05/29/14 (from the past 24 hour(s))  Urinalysis, Routine w reflex microscopic     Status: Abnormal   Collection Time: 05/29/14 10:52 AM  Result Value Ref Range   Color, Urine YELLOW YELLOW   APPearance TURBID (A) CLEAR   Specific Gravity, Urine 1.016 1.005 - 1.030   pH 5.5 5.0 - 8.0   Glucose, UA 100 (A) NEGATIVE mg/dL   Hgb urine dipstick LARGE (A) NEGATIVE   Bilirubin Urine NEGATIVE NEGATIVE   Ketones, ur NEGATIVE NEGATIVE mg/dL   Protein, ur 100 (A) NEGATIVE mg/dL   Urobilinogen, UA 1.0 0.0 - 1.0 mg/dL   Nitrite NEGATIVE NEGATIVE   Leukocytes, UA LARGE (A) NEGATIVE  Urine microscopic-add on     Status: Abnormal   Collection Time: 05/29/14 10:52 AM  Result Value Ref Range   WBC, UA 21-50 <3 WBC/hpf   RBC / HPF 3-6 <3 RBC/hpf   Bacteria, UA MANY (A) RARE   Casts GRANULAR CAST (A) NEGATIVE   Urine-Other AMORPHOUS URATES/PHOSPHATES   Comprehensive metabolic panel     Status: Abnormal   Collection Time: 05/29/14 11:10 AM  Result Value Ref Range   Sodium 130 (L) 135 - 145 mmol/L   Potassium 3.5 3.5 - 5.1 mmol/L   Chloride 97 96 - 112 mmol/L   CO2 24 19 - 32 mmol/L   Glucose, Bld 350 (H) 70 - 99 mg/dL   BUN 38 (H) 6 - 23 mg/dL   Creatinine, Ser 0.89 0.50 - 1.10 mg/dL   Calcium 9.0 8.4 - 10.5 mg/dL   Total Protein 7.7 6.0  - 8.3 g/dL   Albumin 2.9 (L) 3.5 - 5.2 g/dL   AST 103 (H) 0 - 37 U/L   ALT 59 (H) 0 - 35 U/L   Alkaline Phosphatase 131 (H) 39 - 117 U/L   Total Bilirubin 1.1 0.3 - 1.2 mg/dL   GFR calc non Af Amer 61 (L) >90 mL/min   GFR calc Af Amer 71 (L) >90 mL/min   Anion gap 9 5 - 15  CBC  with Differential     Status: Abnormal   Collection Time: 05/29/14 11:10 AM  Result Value Ref Range   WBC 16.4 (H) 4.0 - 10.5 K/uL   RBC 4.22 3.87 - 5.11 MIL/uL   Hemoglobin 13.6 12.0 - 15.0 g/dL   HCT 38.8 36.0 - 46.0 %   MCV 91.9 78.0 - 100.0 fL   MCH 32.2 26.0 - 34.0 pg   MCHC 35.1 30.0 - 36.0 g/dL   RDW 12.6 11.5 - 15.5 %   Platelets 254 150 - 400 K/uL   Neutrophils Relative % 85 (H) 43 - 77 %   Neutro Abs 13.8 (H) 1.7 - 7.7 K/uL   Lymphocytes Relative 6 (L) 12 - 46 %   Lymphs Abs 1.0 0.7 - 4.0 K/uL   Monocytes Relative 9 3 - 12 %   Monocytes Absolute 1.4 (H) 0.1 - 1.0 K/uL   Eosinophils Relative 0 0 - 5 %   Eosinophils Absolute 0.1 0.0 - 0.7 K/uL   Basophils Relative 0 0 - 1 %   Basophils Absolute 0.1 0.0 - 0.1 K/uL  Troponin I     Status: Abnormal   Collection Time: 05/29/14 11:10 AM  Result Value Ref Range   Troponin I 0.04 (H) <0.031 ng/mL  CK     Status: Abnormal   Collection Time: 05/29/14 11:10 AM  Result Value Ref Range   Total CK 994 (H) 7 - 177 U/L    X-ray: CLINICAL DATA: Fall in bathroom with right ankle pain. Initial encounter  EXAM: RIGHT ANKLE - COMPLETE 3+ VIEW  COMPARISON: None.  FINDINGS: Nondisplaced oblique fracture of the distal fibula, above the ankle joint. There is no medial malleolus fracture or ankle mortise widening.  Osteopenia.  IMPRESSION: Nondisplaced distal fibula fracture  Review of Systems  Constitutional: Negative for fever, chills and weight loss.  HENT: Negative for hearing loss.   Eyes: Negative for blurred vision and double vision.  Respiratory: Negative for cough, hemoptysis and sputum production.   Cardiovascular: Negative for  chest pain and orthopnea.  Gastrointestinal: Negative for heartburn, nausea, vomiting, diarrhea and constipation.  Genitourinary: Positive for urgency and frequency.  Musculoskeletal: Positive for falls. Negative for myalgias, back pain, joint pain and neck pain.  Neurological: Positive for dizziness and weakness. Negative for headaches.                                       Blood pressure 145/59, pulse 104, temperature 99.5 F (37.5 C), temperature source Oral, resp. rate 18, height 5\' 6"  (1.676 m), weight 89.359 kg (197 lb), SpO2 96 %.  Physical Exam  Assessment/Plan:  1.  Non displaced right fibula fracture 2. Sacral-coccygeal contusion   Currently in posterior splint NWB RLE  PT order placed for mobility assessment and recommendations She lives alone and may require ST SNF for rehab  RTC in 2 weeks for X-rays and hopeful progression in weight bearing status  Babs Dabbs D 05/29/2014, 3:51 PM

## 2014-05-30 ENCOUNTER — Inpatient Hospital Stay (HOSPITAL_COMMUNITY): Payer: Medicare Other

## 2014-05-30 DIAGNOSIS — I4891 Unspecified atrial fibrillation: Secondary | ICD-10-CM

## 2014-05-30 DIAGNOSIS — I639 Cerebral infarction, unspecified: Secondary | ICD-10-CM

## 2014-05-30 DIAGNOSIS — I638 Other cerebral infarction: Secondary | ICD-10-CM

## 2014-05-30 LAB — GLUCOSE, CAPILLARY
GLUCOSE-CAPILLARY: 300 mg/dL — AB (ref 70–99)
Glucose-Capillary: 238 mg/dL — ABNORMAL HIGH (ref 70–99)
Glucose-Capillary: 339 mg/dL — ABNORMAL HIGH (ref 70–99)
Glucose-Capillary: 126 mg/dL — ABNORMAL HIGH (ref 70–99)
Glucose-Capillary: 257 mg/dL — ABNORMAL HIGH (ref 70–99)

## 2014-05-30 LAB — CBC
HCT: 34.1 % — ABNORMAL LOW (ref 36.0–46.0)
Hemoglobin: 11.5 g/dL — ABNORMAL LOW (ref 12.0–15.0)
MCH: 31.6 pg (ref 26.0–34.0)
MCHC: 33.7 g/dL (ref 30.0–36.0)
MCV: 93.7 fL (ref 78.0–100.0)
Platelets: 236 10*3/uL (ref 150–400)
RBC: 3.64 MIL/uL — ABNORMAL LOW (ref 3.87–5.11)
RDW: 13 % (ref 11.5–15.5)
WBC: 13.2 10*3/uL — ABNORMAL HIGH (ref 4.0–10.5)

## 2014-05-30 LAB — COMPREHENSIVE METABOLIC PANEL
ALT: 64 U/L — ABNORMAL HIGH (ref 0–35)
AST: 104 U/L — ABNORMAL HIGH (ref 0–37)
Albumin: 2.3 g/dL — ABNORMAL LOW (ref 3.5–5.2)
Alkaline Phosphatase: 106 U/L (ref 39–117)
Anion gap: 8 (ref 5–15)
BUN: 44 mg/dL — AB (ref 6–23)
CO2: 22 mmol/L (ref 19–32)
Calcium: 8 mg/dL — ABNORMAL LOW (ref 8.4–10.5)
Chloride: 101 mmol/L (ref 96–112)
Creatinine, Ser: 1.42 mg/dL — ABNORMAL HIGH (ref 0.50–1.10)
GFR calc Af Amer: 40 mL/min — ABNORMAL LOW (ref >90)
GFR calc non Af Amer: 35 mL/min — ABNORMAL LOW (ref >90)
GLUCOSE: 252 mg/dL — AB (ref 70–99)
POTASSIUM: 3.6 mmol/L (ref 3.5–5.1)
Sodium: 131 mmol/L — ABNORMAL LOW (ref 135–145)
TOTAL PROTEIN: 6.2 g/dL (ref 6.0–8.3)
Total Bilirubin: 0.9 mg/dL (ref 0.3–1.2)

## 2014-05-30 LAB — HEMOGLOBIN A1C
HEMOGLOBIN A1C: 10.4 % — AB (ref 4.8–5.6)
Mean Plasma Glucose: 252 mg/dL

## 2014-05-30 LAB — TROPONIN I: Troponin I: 0.06 ng/mL — ABNORMAL HIGH (ref <0.031)

## 2014-05-30 LAB — CK
CK TOTAL: 454 U/L — AB (ref 7–177)
Total CK: 289 U/L — ABNORMAL HIGH (ref 7–177)

## 2014-05-30 MED ORDER — STROKE: EARLY STAGES OF RECOVERY BOOK
Freq: Once | Status: AC
  Administered 2014-05-30: 13:00:00
  Filled 2014-05-30: qty 1

## 2014-05-30 MED ORDER — IOHEXOL 350 MG/ML SOLN
100.0000 mL | Freq: Once | INTRAVENOUS | Status: AC | PRN
  Administered 2014-05-30: 100 mL via INTRAVENOUS

## 2014-05-30 MED ORDER — SODIUM CHLORIDE 0.9 % IV SOLN: INTRAVENOUS | Status: AC

## 2014-05-30 NOTE — Progress Notes (Signed)
Report called to [facility] at [facility].  Will arrange for [facility] transport.  Durwin Nora RN

## 2014-05-30 NOTE — Progress Notes (Addendum)
Rapid Response Event Note  Overview: Responded to Code stroke to patient in room [id number]. Patient was in [place] with Dr Leonel Ramsay.        Initial Focused Assessment:asphasgia noted, able to follow 1 out of 2 commands.  No arm drift noted per MD; Md performed NIH scale.     Interventions: CT scan; CT Angiogram, CBG 336; Iv placed to rt forearmin CT by Kandra Nicolas, [facility] 20g, patient transported back to room 1512 with Dr Leonel Ramsay, [facility] and Shanon Brow, RN present.  Request made for transfer to [facility] on [facility].   NS bolus 500cc started at 1135; EKG obtained.  VS at 1130- 98.2, 18, 96% 2l via [place]; 132/46, 80.  Patient alert and following with eyes.    Event Summary:   at  MD remains at bedside and MD speaking with family and patient Dr Charlies Silvers notified and Dr Leonel Ramsay spoke with her concerning patients condition. 1156-patient spoke yes and making noises/laughing; family remains at beside.  Awaiting transfer to [facility].  No further stat  interventions needed at this time.  Will continue to monitor patient till discharge to [facility].  MRI and labs to be done at transfer.    at          Roma Schanz

## 2014-05-30 NOTE — Progress Notes (Signed)
VASCULAR LAB PRELIMINARY  PRELIMINARY  PRELIMINARY  PRELIMINARY  Carotid duplex  completed.    Preliminary report:  Bilateral:  1-39% ICA stenosis.  Vertebral artery flow is antegrade.      Dorette Hartel, RVT 05/30/2014, 12:28 PM

## 2014-05-30 NOTE — Consult Note (Signed)
Neurology Consultation Reason for Consult: Aphasia Referring Physician: Charlies Silvers, A  CC: Fibular fracture.   History is obtained from:patient.   HPI: Kristina Summers is a 79 y.o. female with a history of afib s/p ablation procedures no longer on anti-coagulation who presented following a fall with ankle fracture. This is being managed conservatively.   She was "spacey" when the nurse spoke with her this morning and seemed to have some trouble finding her words, but was able to respond. She states that she was having some difficulty speaking even the first time that she spoke.   When her daughter saw her around 8am, she noticed she was off, but thought it was pain medication. She subsequently saw her again around 9:45 am and she was having difficulty speaking.    LKW: 2/10 prior to bed.  tpa given?: no, out of window.  NIHSS: 6(2 for questions, 1 for gaze preference, 2 for aphasia, 1 for dysarthria)   ROS: Unable to obtain due to altered mental status.   Past Medical History  Diagnosis Date  . CAD (coronary artery disease)   . Obesity   . Atrial fibrillation   . Hypertension   . Diabetes mellitus   . Hyperlipidemia   . Peripheral vascular disease   . GI bleed 1/11  . Carotid artery occlusion   . Normal nuclear stress test 03/18/2011  . Angina   . GERD (gastroesophageal reflux disease)   . Arthritis   . Fibromyalgia   . Myocardial infarction ~ 1991  . UTI (lower urinary tract infection) Sept. 2015    Family History: Mother- stroke  Social History: Tob: quit 20 years ago  Exam: Current vital signs: BP 130/47 mmHg  Pulse 95  Temp(Src) 97.5 F (36.4 C) (Oral)  Resp 20  Ht 5\' 6"  (1.676 m)  Wt 89.359 kg (197 lb)  BMI 31.81 kg/m2  SpO2 98% Vital signs in last 24 hours: Temp:  [97.4 F (36.3 C)-101.2 F (38.4 C)] 97.5 F (36.4 C) (02/11 1004) Pulse Rate:  [82-104] 95 (02/11 1004) Resp:  [16-20] 20 (02/11 1004) BP: (103-148)/(40-81) 130/47 mmHg (02/11 1004) SpO2:   [90 %-100 %] 98 % (02/11 1004) Weight:  [89.359 kg (197 lb)] 89.359 kg (197 lb) (02/10 1500)  Physical Exam  Constitutional: Appears well-developed and well-nourished.  Psych: Affect appropriate to situation Eyes: No scleral injection HENT: No OP obstrucion Head: Normocephalic.  Cardiovascular: irregular Respiratory: Effort normal and breath sounds normal to anterior ascultation GI: Soft.  No distension. There is no tenderness.  Ext: right leg in cast.   Neuro: Mental Status: Patient is awake, alert, she is able to communicate with head nods/shakes but unable to speak more than a rare "yes"(which is understanadble but slurred). She does attend to the right side.  Cranial Nerves: II: Visual Fields are full. Pupils are equal, round, and reactive to light.   III,IV, VI: She has a left gaze preference, but will track to the right. EOMI without ptosis or diploplia.  V: Facial sensation is symmetric to temperature VII: Facial movement is notable for mild right lower facial weakness.  VIII: hearing is intact to voice X: Uvula elevates symmetrically XI: Shoulder shrug is symmetric. XII: tongue is midline without atrophy or fasciculations.  Motor: Tone is normal. Bulk is normal. 5/5 strength was present in all four extremities.  Sensory: Sensation is symmetric to light touch and temperature in the arms and legs. Plantars: Toes are downgoing bilaterally.  Cerebellar: FNF and HKS are intact bilaterally  I have reviewed labs in epic and the results pertinent to this consultation are: Mildly elevated creatinine at 1.4   I have reviewed the images obtained:CT head - negative CTA head - no large vessel IR target.   Impression: 79 yo F with sudden onset expressive aphaisa. Possibilities include fat embolism from ankle fracture, embolism, or thrombosis from atherosclerosis. She will need a stroke workup. She was unfortunately out of the time window and is not an IR candidate due to lack  of large vessel involvement.   Recommendations: 1. HgbA1c, fasting lipid panel 2. MRI, MRA  of the brain without contrast 3. Frequent neuro checks 4. Echocardiogram 5. Carotid dopplers 6. Prophylactic therapy-Antiplatelet med: Aspirin - dose 325mg  PO or 300mg  PR 7. Risk factor modification 8. Telemetry monitoring 9. PT consult, OT consult, Speech consult  Roland Rack, MD [facility] [phone]  If 7pm- 7am, please page neurology on call as listed in [place].

## 2014-05-30 NOTE — Progress Notes (Signed)
Inpatient Diabetes Program Recommendations  AACE/ADA: New Consensus Statement on Inpatient Glycemic Control (2013)  Target Ranges:  Prepandial:   less than 140 mg/dL      Peak postprandial:   less than 180 mg/dL (1-2 hours)      Critically ill patients:  140 - 180 mg/dL     Results for Kristina Summers, Kristina Summers (MRN [id number]) as of 05/30/2014 12:15  Ref. Range 05/29/2014 16:21 05/29/2014 21:40  Glucose-Capillary Latest Range: 70-99 mg/dL 298 (H) 241 (H)    Results for Kristina Summers, Kristina Summers (MRN [id number]) as of 05/30/2014 12:15  Ref. Range 05/30/2014 07:21 05/30/2014 11:30  Glucose-Capillary Latest Range: 70-99 mg/dL 238 (H) 300 (H)     Home DM Meds: NPH insulin- 38 units AM/ 34 units PM       Regular insulin- 5 units QHS??  Current Orders: NPH insulin- 25 units bid     Novolog Moderate SSI tid ac + HS    MD- Please consider increasing NPH insulin to home doses of 38 units AM with breakfast/ 34 units PM at bedtime    Will follow Wyn Quaker RN, MSN, CDE Diabetes Coordinator Inpatient Diabetes Program Team Pager: [phone] (8a-10p)

## 2014-05-30 NOTE — Progress Notes (Signed)
Patient having expressive aphasia, Dr. Charlies Silvers on floor, notified of findings.  Order for stat MRI, code stroke initiated.  Patient transported down to CT.  Durwin Nora RN

## 2014-05-30 NOTE — Consult Note (Signed)
Cardiology Consult Note  Admit date: 05/29/2014 Name: Kristina Summers 79 y.o.  female DOB:  1936/01/26 MRN:  [id number]  Today's date:  05/30/2014  Referring Physician:    [facility]  Reason for Consultation:   Abnormal troponin, previous atrial fibrillation, new stroke  IMPRESSIONS: 1.  Flat mildly elevated troponins that likely her nonspecific and related possibly to rhabdomyolysis, atrial fibrillation, or renal insufficiency.  I do not think this is an acute coronary syndrome or even demand ischemia. 2.  New stroke noted today 3.  Paroxysmal atrial fibrillation currently in atrial fibrillation but thought to be poor candidate for anticoagulation due to prior risk of GI bleeding as well as frequent falls 4.  Coronary artery disease with previous bypass grafting 5.  Diabetes mellitus with peripheral vascular disease 6.  Hypertensive heart disease 7.  Severe peripheral vascular disease 8.  Hyperlipidemia 9.  Obesity 10.  Probable urinary tract infection  RECOMMENDATION: No further workup necessary for the following episodes.  Obviously would benefit from stroke reduction from anticoagulation but has had GI bleeding in the past on this and according to daughter has fallen 3 times this week prior to her stroke.  We'll help with rate control with atrial fibrillation.  Await echocardiogram  HISTORY: This very nice 79 year old female has a history of coronary artery disease with bypass grafting in 2000 by Dr. Cyndia Bent.  She had a nonischemic myocardial perfusion scan done about 3 years ago.  She has a history of paroxysmal atrial fibrillation previously on warfarin but stopped due to GI bleeding.  She also has significant peripheral vascular disease with claudication and hypertensive heart disease and has diabetes with complications.  She was admitted to the hospital having been found on the floor with a fractured ankle.  She evidently went to use the bathroom" turn lost her balance and fell  down and according to her daughter has had nausea vomiting and diarrhea this past week and has fallen 3 times.  She was unable to get up after her fall and a concerned neighbor called maintenance and EMS was called and she was transported to the hospital.  She was initially admitted to [facility] and troponins were cycled and showed low level elevation of troponin and cardiology was consulted.  She has not had any chest pain and the troponins have remained borderline elevated and in a flat pattern.  She has not had any severe shortness of breath recently.  Previous LV function has been preserved.  She was found also to have a temperature of 102 as well as a possible urinary tract infection.  This morning her family came to visit her and noted that she was not talking properly and she was found to have a stroke that they think may have happened during the evening.  She was in atrial fibrillation on arrival to the hospital last evening.   Past Medical History  Diagnosis Date  . CAD (coronary artery disease)   . Obesity   . Atrial fibrillation   . Hypertension   . Diabetes mellitus   . Hyperlipidemia   . Peripheral vascular disease   . GI bleed 1/11  . Carotid artery occlusion   . Normal nuclear stress test 03/18/2011  . Angina   . GERD (gastroesophageal reflux disease)   . Arthritis   . Fibromyalgia   . Myocardial infarction ~ 1991  . UTI (lower urinary tract infection) Sept. 2015      Past Surgical History  Procedure Laterality Date  .  Trigger finger release  1996    right thumb  . Cataract extraction w/ intraocular lens  implant, bilateral  2004-2005  . Coronary artery bypass graft  2000    CABG X5  . Peripheral arterial stent graft  04/01/11    right common iliac  . Angioplasty  06/17/11    Left leg common femoral artery cannulation under u/s Left leg runoff  . Eye surgery    . Abdominal aortagram N/A 04/01/2011    Procedure: ABDOMINAL AORTAGRAM;  Surgeon: Conrad Kailua,  MD;  Location: [facility] C[facility] LAB;  Service: Cardiovascular;  Laterality: N/A;  . Percutaneous stent intervention Right 04/01/2011    Procedure: PERCUTANEOUS STENT INTERVENTION;  Surgeon: Conrad [place], MD;  Location: [facility] CATH LAB;  Service: Cardiovascular;  Laterality: Right;  rt iliac stent  . Lower extremity angiogram Bilateral 04/01/2011    Procedure: LOWER EXTREMITY ANGIOGRAM;  Surgeon: Conrad Bedias, MD;  Location: [facility] CATH LAB;  Service: Cardiovascular;  Laterality: Bilateral;  . Lower extremity angiogram Left 06/17/2011    Procedure: LOWER EXTREMITY ANGIOGRAM;  Surgeon: Conrad Gilmer, MD;  Location: [facility] CATH LAB;  Service: Cardiovascular;  Laterality: Left;  . Lower extremity angiogram N/A 11/18/2011    Procedure: LOWER EXTREMITY ANGIOGRAM;  Surgeon: Conrad [place], MD;  Location: [facility] CATH LAB;  Service: Cardiovascular;  Laterality: N/A;     Allergies:  is allergic to morphine and related; lisinopril; sertraline hcl; and zoloft.   Medications: Prior to Admission medications   Medication Sig Start Date End Date Taking? Authorizing Provider  acetaminophen (TYLENOL) 325 MG tablet Take 2 tablets (650 mg total) by mouth every 6 (six) hours as needed for mild pain (or Fever >/= 101). 08/27/13  Yes Theodis Blaze, MD  amLODipine (NORVASC) 5 MG tablet Take 1 tablet by mouth daily. 03/06/12  Yes Historical Provider, MD  aspirin 81 MG tablet Take 81 mg by mouth daily.   Yes Historical Provider, MD  atorvastatin (LIPITOR) 80 MG tablet Take 1 tablet (80 mg total) by mouth daily. 11/27/12  Yes Freeman Caldron Baker, PA-C  furosemide (LASIX) 40 MG tablet Take 20-60 mg by mouth 2 (two) times daily. Pt takes 1.5 tabs (60mg ) in the morning and 1/2 tablet (20mg ) in the evening   Yes Historical Provider, MD  HUMULIN R 100 UNIT/ML injection Inject 5 Units into the skin at bedtime.  01/23/14  Yes Historical Provider, MD  insulin NPH Human (HUMULIN N,NOVOLIN N) 100 UNIT/ML injection Inject 37 Units into the skin 2 (two) times daily. 38  in am and 34 at night 11/27/12  Yes Liam Graham, PA-C  losartan (COZAAR) 50 MG tablet Take 50 mg by mouth 2 (two) times daily.     Yes Historical Provider, MD  metoprolol tartrate (LOPRESSOR) 25 MG tablet Take 1 tablet by mouth 2 (two) times daily. 04/16/14  Yes Historical Provider, MD  Multiple Vitamins-Minerals (ONE-A-DAY 50 PLUS PO) Take 1 tablet by mouth daily.   Yes Historical Provider, MD  NITROSTAT 0.4 MG SL tablet Place 1 tablet under the tongue every 5 (five) minutes x 3 doses as needed. Chest pain 03/07/14  Yes Historical Provider, MD  sulfamethoxazole-trimethoprim (BACTRIM DS) 800-160 MG per tablet Take 1 tablet by mouth every 12 (twelve) hours. Patient not taking: Reported on 04/18/2014 08/27/13   Theodis Blaze, MD    Family History: Family Status  Relation Status Death Age  . Brother Alive     diabetes  . Father Deceased 62  died of heart disease  . Mother Deceased 51    died of CVA, had history of CAD  . Brother Deceased 20    died of pneumonia    Social History:   reports that she has quit smoking. Her smoking use included Cigarettes. She has a 60 pack-year smoking history. She has never used smokeless tobacco. She reports that she does not drink alcohol or use illicit drugs.   History   Social History Narrative   Divorced.  Native of [place].  Formerly worked as Education administrator person.    Review of Systems: Not obtainable except as noted in the history of present illness  Physical Exam: BP 126/52 mmHg  Pulse 85  Temp(Src) 98 F (36.7 C) (Oral)  Resp 20  Ht 5\' 6"  (1.676 m)  Wt 89.359 kg (197 lb)  BMI 31.81 kg/m2  SpO2 98%  General appearance: Pleasant obese white female who has a marked expressive aphasia but is able to greet me by my name and is in no acute distress Head: Normocephalic, without obvious abnormality, atraumatic Eyes: conjunctivae/corneas clear. PERRL, EOM's intact. Fundi benign. Neck: no adenopathy, no carotid bruit, no JVD and supple,  symmetrical, trachea midline Lungs: clear to auscultation bilaterally Heart: 2/6 murmur over the aortic valve, rapid irregular rhythm, no S3 Abdomen: soft, non-tender; bowel sounds normal; no masses,  no organomegaly Pelvic: deferred Extremities: The right leg is in a cast and is mildly swollen above the cast with venous insufficiency, peripheral pulses diminished on the left, Pulses: Femoral pulses are palpable Neurologic: Significant expressive aphasia but able to say yes and no and answers appropriately with one-word answers, able to move all extremities  Labs: CBC  Recent Labs  05/29/14 1110 05/30/14 0254  WBC 16.4* 13.2*  RBC 4.22 3.64*  HGB 13.6 11.5*  HCT 38.8 34.1*  PLT 254 236  MCV 91.9 93.7  MCH 32.2 31.6  MCHC 35.1 33.7  RDW 12.6 13.0  LYMPHSABS 1.0  --   MONOABS 1.4*  --   EOSABS 0.1  --   BASOSABS 0.1  --    CMP   Recent Labs  05/30/14 0254  NA 131*  K 3.6  CL 101  CO2 22  GLUCOSE 252*  BUN 44*  CREATININE 1.42*  CALCIUM 8.0*  PROT 6.2  ALBUMIN 2.3*  AST 104*  ALT 64*  ALKPHOS 106  BILITOT 0.9  GFRNONAA 35*  GFRAA 40*   BNP (last 3 results) No results found for: BNP Cardiac Panel (last 3 results) Cardiac Panel (last 3 results)  Recent Labs  05/29/14 1110 05/29/14 1548 05/29/14 2108 05/30/14 0253 05/30/14 0254 05/30/14 1204  CKTOTAL 994*  --   --   --  454* 289*  TROPONINI 0.04* 0.04* 0.05* 0.06*  --   --    EKG: Left bundle branch block, atrial fibrillation  Signed:  W. Doristine Church MD [facility]   Cardiology Consultant  05/30/2014, 6:10 PM

## 2014-05-30 NOTE — Progress Notes (Signed)
Patient ID: Kristina Summers, female   DOB: [birth year], 79 y.o.   MRN: [id number] [facility] PROGRESS NOTE  Tenecia Ignasiak [id number] DOB: 06-28-1935 DOA: 05/29/2014 PCP: [name], MD  Brief narrative:    79 y.o. female with a past medical history of poorly controlled diabetes, coronary artery disease status post CABG, atrial fibrillation not on anticoagulation due to history of bleeding, history a few falls. She presented to [facility] long hospital status post fall, she was found lying on the floor, apparently she was ambulating to the bathroom and lost her balance and fell down. On admission, patient was hemodynamically stable. She did have a fever of 101.62F. Blood work was significant for leukocytosis of 16.4, hemoglobin 11.5, CK 994, creatinine 1.42, troponin elevation at 0.04. She was found to have right fibular fracture. Ortho placed splint, no plan for surgical intervention. In addition, she was found to have UTI based on urinalysis. She was started on empiric Rocephin.   Assessment/Plan:    Principal problem: Aphasia / probable CVA / acute encephalopathy  - Order placed for stroke set.   Aspirin daily - patient is on daily aspirin. CT head - pending MRI brain / MRA brain- pending 2D ECHO - follow-up Carotid doppler- follow-up HgbA1c - 10.4 Lipid panel VTE prophylaxis: heparin subcutaneous Diet: NPO  Therapy: PT/OT - ordered, follow up  Hyperlipidemia  LDL ; LDL goal < 100  Patient on atorvastatin 80 mg daily Other Stroke Risk Factors  Advanced age Hypertension Atrial fibrillation not on anticoagulation  Active problems: Right distal fibular fracture - Appreciate orthopedic recommendations  Sepsis secondary to urinary tract infection - Sepsis criteria met on the admission with fever, tachycardia, tachypnea with RR of 20 in addition to leukocytosis and source of infection being urinary tract infection based on urinalysis. - Patient is hemodynamically stable, she does not  require pressor support. - Patient was started on empiric Rocephin. Follow-up urine culture results.  Urinary tract infection - Urinalysis on the admission showed large leukocytes with many bacteria. - Patient started on Rocephin. Follow up urine culture results  History of atrial fibrillation - Not on anticoagulation because of history of bleeding. She is on daily aspirin. - Rate controlled with metoprolol 25 mg by mouth twice a day. - The 12-lead EKG on the admission showed atrial fibrillation.  History of coronary artery disease, status post CABG / troponin elevation - EKG on the admission is stable with no acute ischemic changes - Troponin mildly elevated on admission, 0.04. Patient did not have complaints of chest pain. Likely demand ischemia from UTI, fall and fibular fracture. - Cardiology consulted.  Diabetes mellitus, uncontrolled with peripheral artery disease - A1c on this admission is 10.4 indicating poor glycemic control. - Current insulin regimen includes NPH insulin 25 units twice daily along with sliding scale insulin.   Essential hypertension - Continue Norvasc, losartan and metoprolol  Acute renal failure - Perhaps because of Lasix versus possible rhabdomyolysis  - Lasix is placed on hold. - Continue to monitor renal function.  Rhabdomyolysis - CK level 994 on the admission. We will repeat the level today. Patient received IV fluids on the admission.  Anemia of chronic disease - Likely secondary to history of atrial fibrillation although currently not on anticoagulation. She is only on aspirin. - Hemoglobin is 11.5. No reports of bleeding.   DVT Prophylaxis  - Heparin subQ ordered    Code Status: Full.  Family Communication:  plan of care discussed with the patient's family at the bedside  Disposition Plan: Home when stable.   IV access:  Peripheral IV  Procedures and diagnostic studies:    Dg Ankle Complete Right 05/29/2014   Nondisplaced distal  fibula fracture.     Dg Hip Unilat With Pelvis 2-3 Views Right 05/29/2014  No acute osseous findings.     Medical Consultants:  Neurology Orthopedic surgery  Other Consultants:  Physical therapy  IAnti-Infectives:   Rocephin 05/29/2014 -->   Leisa Lenz, MD  [facility] Pager [phone]  If 7PM-7AM, please contact night-coverage [email or web address] Password [facility] 05/30/2014, 10:59 AM   LOS: 1 day    HPI/Subjective: No acute overnight events.  Objective: Filed Vitals:   05/29/14 2103 05/30/14 0631 05/30/14 0649 05/30/14 1004  BP: 119/49 103/40 118/51 130/47  Pulse: 93 82  95  Temp: 101.2 F (38.4 C) 98.3 F (36.8 C)  97.5 F (36.4 C)  TempSrc: Oral Oral  Oral  Resp: $Remo'18 18  20  'klpNt$ Height:      Weight:      SpO2: 90% 94%  98%    Intake/Output Summary (Last 24 hours) at 05/30/14 1059 Last data filed at 05/30/14 0400  Gross per 24 hour  Intake    360 ml  Output    400 ml  Net    -40 ml    Exam:   General:  Pt is not in distress  Cardiovascular: Regular rate and rhythm, S1/S2, no murmurs  Respiratory: Clear to auscultation bilaterally, no wheezing, no crackles, no rhonchi  Abdomen: Soft, non tender, non distended, bowel sounds present  Extremities: right leg wrapped, pulses DP and PT palpable bilaterally  Neuro: Aphasia, minimally follows commands  Data Reviewed: Basic Metabolic Panel:  Recent Labs Lab 05/29/14 1110 05/30/14 0254  NA 130* 131*  K 3.5 3.6  CL 97 101  CO2 24 22  GLUCOSE 350* 252*  BUN 38* 44*  CREATININE 0.89 1.42*  CALCIUM 9.0 8.0*   Liver Function Tests:  Recent Labs Lab 05/29/14 1110 05/30/14 0254  AST 103* 104*  ALT 59* 64*  ALKPHOS 131* 106  BILITOT 1.1 0.9  PROT 7.7 6.2  ALBUMIN 2.9* 2.3*   No results for input(s): LIPASE, AMYLASE in the last 168 hours. No results for input(s): AMMONIA in the last 168 hours. CBC:  Recent Labs Lab 05/29/14 1110 05/30/14 0254  WBC 16.4* 13.2*  NEUTROABS 13.8*  --   HGB  13.6 11.5*  HCT 38.8 34.1*  MCV 91.9 93.7  PLT 254 236   Cardiac Enzymes:  Recent Labs Lab 05/29/14 1110 05/29/14 1548 05/29/14 2108 05/30/14 0253 05/30/14 0254  CKTOTAL 994*  --   --   --  454*  TROPONINI 0.04* 0.04* 0.05* 0.06*  --    BNP: Invalid input(s): POCBNP CBG:  Recent Labs Lab 05/29/14 1621 05/29/14 2140 05/30/14 0721  GLUCAP 298* 241* 238*    No results found for this or any previous visit (from the past 240 hour(s)).   Scheduled Meds: .  stroke: mapping our early stages of recovery book   Does not apply Once  . amLODipine  5 mg Oral Daily  . aspirin  81 mg Oral Daily  . atorvastatin  80 mg Oral Daily  . cefTRIAXone (ROCEPHIN)  IV  1 g Intravenous Q24H  . docusate sodium  100 mg Oral BID  . furosemide  20 mg Oral BID  . heparin  5,000 Units Subcutaneous 3 times per day  . insulin aspart  0-15 Units Subcutaneous TID WC  . insulin  aspart  0-5 Units Subcutaneous QHS  . insulin NPH Human  25 Units Subcutaneous BID  . losartan  50 mg Oral BID  . metoprolol tartrate  25 mg Oral BID  . sodium chloride  3 mL Intravenous Q12H   Continuous Infusions:

## 2014-05-30 NOTE — Progress Notes (Signed)
PT Cancellation Note  Patient Details Name: Kristina Summers MRN: [id number] DOB: Jan 23, 1936   Cancelled Treatment:    Reason Eval/Treat Not Completed: Patient at procedure or test/unavailable (aphasia, pt went for CT) Will check back as schedule permits.   Rhesa Forsberg,[name] 05/30/2014, 11:32 AM Carmelia Bake, PT, DPT 05/30/2014 Pager: [phone]

## 2014-05-31 DIAGNOSIS — I6340 Cerebral infarction due to embolism of unspecified cerebral artery: Secondary | ICD-10-CM | POA: Insufficient documentation

## 2014-05-31 DIAGNOSIS — W19XXXA Unspecified fall, initial encounter: Secondary | ICD-10-CM | POA: Insufficient documentation

## 2014-05-31 DIAGNOSIS — E669 Obesity, unspecified: Secondary | ICD-10-CM

## 2014-05-31 DIAGNOSIS — I639 Cerebral infarction, unspecified: Secondary | ICD-10-CM | POA: Insufficient documentation

## 2014-05-31 LAB — GLUCOSE, CAPILLARY
GLUCOSE-CAPILLARY: 199 mg/dL — AB (ref 70–99)
Glucose-Capillary: 222 mg/dL — ABNORMAL HIGH (ref 70–99)
Glucose-Capillary: 371 mg/dL — ABNORMAL HIGH (ref 70–99)
Glucose-Capillary: 150 mg/dL — ABNORMAL HIGH (ref 70–99)

## 2014-05-31 LAB — LIPID PANEL
CHOL/HDL RATIO: 8.4 ratio
Cholesterol: 92 mg/dL (ref 0–200)
HDL: 11 mg/dL — ABNORMAL LOW (ref >39)
LDL CALC: 48 mg/dL (ref 0–99)
Triglycerides: 165 mg/dL — ABNORMAL HIGH (ref <150)
VLDL: 33 mg/dL (ref 0–40)

## 2014-05-31 LAB — CBC
HCT: 34.7 % — ABNORMAL LOW (ref 36.0–46.0)
HEMOGLOBIN: 11.9 g/dL — AB (ref 12.0–15.0)
MCH: 32 pg (ref 26.0–34.0)
MCHC: 34.3 g/dL (ref 30.0–36.0)
MCV: 93.3 fL (ref 78.0–100.0)
Platelets: 279 10*3/uL (ref 150–400)
RBC: 3.72 MIL/uL — AB (ref 3.87–5.11)
RDW: 13.2 % (ref 11.5–15.5)
WBC: 10.8 10*3/uL — AB (ref 4.0–10.5)

## 2014-05-31 LAB — BASIC METABOLIC PANEL
Anion gap: 6 (ref 5–15)
BUN: 22 mg/dL (ref 6–23)
CO2: 23 mmol/L (ref 19–32)
CREATININE: 0.84 mg/dL (ref 0.50–1.10)
Calcium: 7.8 mg/dL — ABNORMAL LOW (ref 8.4–10.5)
Chloride: 103 mmol/L (ref 96–112)
GFR calc Af Amer: 76 mL/min — ABNORMAL LOW (ref >90)
GFR, EST NON AFRICAN AMERICAN: 66 mL/min — AB (ref >90)
Glucose, Bld: 298 mg/dL — ABNORMAL HIGH (ref 70–99)
Potassium: 3.6 mmol/L (ref 3.5–5.1)
SODIUM: 132 mmol/L — AB (ref 135–145)

## 2014-05-31 LAB — URINE CULTURE
Colony Count: NO GROWTH
Culture: NO GROWTH

## 2014-05-31 MED ORDER — INSULIN NPH (HUMAN) (ISOPHANE) 100 UNIT/ML ~~LOC~~ SUSP
30.0000 [IU] | Freq: Two times a day (BID) | SUBCUTANEOUS | Status: DC
  Administered 2014-05-31: 30 [IU] via SUBCUTANEOUS

## 2014-05-31 MED ORDER — NYSTATIN 100000 UNIT/GM EX POWD
Freq: Two times a day (BID) | CUTANEOUS | Status: DC
  Administered 2014-05-31 – 2014-06-03 (×6): via TOPICAL
  Filled 2014-05-31: qty 15

## 2014-05-31 MED ORDER — INSULIN NPH (HUMAN) (ISOPHANE) 100 UNIT/ML ~~LOC~~ SUSP: 28.0000 [IU] | Freq: Two times a day (BID) | SUBCUTANEOUS | Status: DC

## 2014-05-31 MED ORDER — FLUCONAZOLE 100 MG PO TABS
150.0000 mg | ORAL_TABLET | Freq: Once | ORAL | Status: AC
  Administered 2014-05-31: 150 mg via ORAL
  Filled 2014-05-31: qty 2

## 2014-05-31 MED ORDER — APIXABAN 5 MG PO TABS
5.0000 mg | ORAL_TABLET | Freq: Two times a day (BID) | ORAL | Status: DC
  Administered 2014-05-31 – 2014-06-03 (×7): 5 mg via ORAL
  Filled 2014-05-31 (×7): qty 1

## 2014-05-31 NOTE — Progress Notes (Addendum)
ANTICOAGULATION CONSULT NOTE - Initial Consult  Pharmacy Consult for apixaban Indication: atrial fibrillation  Allergies  Allergen Reactions  . Morphine And Related Nausea And Vomiting    Chest pain    . Lisinopril Cough  . Sertraline Hcl     REACTION: nausea  . Zoloft [Sertraline Hcl] Other (See Comments)    Nausea     Patient Measurements: Height: 5\' 6"  (167.6 cm) Weight: 197 lb (89.359 kg) IBW/kg (Calculated) : 59.3  Vital Signs: Temp: 97.5 F (36.4 C) (02/12 1008) Temp Source: Oral (02/12 1008) BP: 132/56 mmHg (02/12 1008) Pulse Rate: 83 (02/12 1008)  Labs:  Recent Labs  05/29/14 1110 05/29/14 1548 05/29/14 2108 05/30/14 0253 05/30/14 0254 05/30/14 1204 05/31/14 1006  HGB 13.6  --   --   --  11.5*  --  11.9*  HCT 38.8  --   --   --  34.1*  --  34.7*  PLT 254  --   --   --  236  --  279  CREATININE 0.89  --   --   --  1.42*  --  0.84  CKTOTAL 994*  --   --   --  454* 289*  --   TROPONINI 0.04* 0.04* 0.05* 0.06*  --   --   --     Estimated Creatinine Clearance: 64.1 mL/min (by C-G formula based on Cr of 0.84).   Medical History: Past Medical History  Diagnosis Date  . CAD (coronary artery disease)   . Obesity   . Atrial fibrillation   . Hypertension   . Diabetes mellitus   . Hyperlipidemia   . Peripheral vascular disease   . GI bleed 1/11  . Carotid artery occlusion   . Normal nuclear stress test 03/18/2011  . Angina   . GERD (gastroesophageal reflux disease)   . Arthritis   . Fibromyalgia   . Myocardial infarction ~ 1991  . UTI (lower urinary tract infection) Sept. 2015    Medications:  Prescriptions prior to admission  Medication Sig Dispense Refill Last Dose  . acetaminophen (TYLENOL) 325 MG tablet Take 2 tablets (650 mg total) by mouth every 6 (six) hours as needed for mild pain (or Fever >/= 101). 60 tablet 1 05/28/2014 at Unknown time  . amLODipine (NORVASC) 5 MG tablet Take 1 tablet by mouth daily.   05/28/2014 at Unknown time  .  aspirin 81 MG tablet Take 81 mg by mouth daily.   Past Week at Unknown time  . atorvastatin (LIPITOR) 80 MG tablet Take 1 tablet (80 mg total) by mouth daily. 30 tablet 0 Past Week at Unknown time  . furosemide (LASIX) 40 MG tablet Take 20-60 mg by mouth 2 (two) times daily. Pt takes 1.5 tabs (60mg ) in the morning and 1/2 tablet (20mg ) in the evening   05/28/2014 at Unknown time  . HUMULIN R 100 UNIT/ML injection Inject 5 Units into the skin at bedtime.    Past Week at Unknown time  . insulin NPH Human (HUMULIN N,NOVOLIN N) 100 UNIT/ML injection Inject 37 Units into the skin 2 (two) times daily. 38 in am and 34 at night   05/28/2014 at Unknown time  . losartan (COZAAR) 50 MG tablet Take 50 mg by mouth 2 (two) times daily.     05/28/2014 at Unknown time  . metoprolol tartrate (LOPRESSOR) 25 MG tablet Take 1 tablet by mouth 2 (two) times daily.   05/28/2014 at 0900  . Multiple Vitamins-Minerals (ONE-A-DAY 50 PLUS PO)  Take 1 tablet by mouth daily.   Past Week at Unknown time  . NITROSTAT 0.4 MG SL tablet Place 1 tablet under the tongue every 5 (five) minutes x 3 doses as needed. Chest pain  1 Past Month at Unknown time  . sulfamethoxazole-trimethoprim (BACTRIM DS) 800-160 MG per tablet Take 1 tablet by mouth every 12 (twelve) hours. (Patient not taking: Reported on 04/18/2014) 20 tablet 0 Completed Course at Unknown time    Assessment: 15 yof presented to the hospital s/p fall. 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 She was not on anticoagulation PTA, except for an aspirin daily, due to history of bleeding. Planning to retry anticoagulation with apixaban. Patients weight is >60kg, age <60 and Scr <1.5. Pt is mildly anemic and platelets are WNL.   Goal of Therapy:  Therapeutic anticoagulation and prevention of stroke Monitor platelets by anticoagulation protocol: Yes   Plan:  1. Apixaban 5mg  PO BID 2. DC heparin SQ 3. F/u S&S of bleeding, changes in renal fxn 4. Will plan to  educate patient prior to discharge 5. MD - Please evaluate if patient should also continue on aspirin  *Pharmacy will sign off and follow peripherally. Thank you for the consult!  Salome Arnt, PharmD, BCPS Pager # [phone] 05-Mar-202016 1:32 PM

## 2014-05-31 NOTE — Progress Notes (Deleted)
OT Cancellation Note  Patient Details Name: Kristina Summers MRN: [id number] DOB: 08-15-1935   Cancelled Treatment:    Reason Eval/Treat Not Completed: Patient not medically ready. Pt has active BR orders at this time. OT will re-attempt when medically appropriate and activity orders have been updated. MD paged to notify of BR orders.    Villa Herb M   Cyndie Chime, OTR/L Occupational Therapist [phone] (pager)  08/03/202016, 11:42 AM

## 2014-05-31 NOTE — Evaluation (Signed)
Occupational Therapy Evaluation Patient Details Name: Kristina Summers MRN: [id number] DOB: 1936-03-09 Today's Date: 01/27/202016    History of Present Illness 79 yo female with onset of  fall with R non dislpaced fibular fracture, and R hemiplegia, UTI with verbal difficulties.  PMHx:  osteopenia, CAD, obesity, a-fib, HTN, DM, HLD, PVD, GI bleed, angina, GERD, fibromyalgia, MI, UTI   Clinical Impression   PTA pt lived at home and was independent with use of RW for ADLs. Pt currently limited by NWB on RLE and expressive aphasia. Pt has limited strength in UEs to promote functional mobility. Pt will benefit from acute OT to address ADLs and functional mobility and is a good candidate for CIR for intensive therapy to return to Mod I level to return home with family support.     Follow Up Recommendations  CIR;Supervision/Assistance - 24 hour    Equipment Recommendations  Other (comment) (TBD)    Recommendations for Other Services Rehab consult     Precautions / Restrictions Precautions Precautions: Fall Required Braces or Orthoses: Other Brace/Splint (R ankle splint with ace wrap) Restrictions Weight Bearing Restrictions: Yes RLE Weight Bearing: Non weight bearing      Mobility Bed Mobility Overal bed mobility: Needs Assistance Bed Mobility: Supine to Sit     Supine to sit: Min assist;Mod assist     General bed mobility comments: mod to first sit up then pt using UE's and scooting with hips more  Transfers Overall transfer level: Needs assistance Equipment used: Rolling walker (2 wheeled);2 person hand held assist Transfers: Sit to/from Stand Sit to Stand: Mod assist;+2 physical assistance;+2 safety/equipment Stand pivot transfers: Mod assist;+2 physical assistance       General transfer comment: Pt could stand for 2 minutes but could not control RLE wbing enough to manage to hop a step         ADL Overall ADL's : Needs assistance/impaired Eating/Feeding:  Independent;Sitting   Grooming: Set up;Sitting   Upper Body Bathing: Minimal assitance;Sitting   Lower Body Bathing: +2 for physical assistance;Sit to/from stand;Maximal assistance   Upper Body Dressing : Minimal assistance;Sitting   Lower Body Dressing: Total assistance;+2 for physical assistance;Sit to/from stand   Toilet Transfer: Moderate assistance;+2 for physical assistance;Stand-pivot;RW Toilet Transfer Details (indicate cue type and reason): transferred bed>recliner with VC's to push through UE and scoot LLE over to the chair Toileting- Clothing Manipulation and Hygiene: Total assistance;+2 for physical assistance;Sit to/from stand         General ADL Comments: Pt limited by NWB status on RLE but motivated to get OOB to eat. Pt has poor UE strength and is unable to push through UEs to clear LLE off floor. Able to scoot with Mod A during SPT     Vision  Pt denies change from baseline.       Praxis Praxis Praxis tested?: Within functional limits    Pertinent Vitals/Pain Pain Assessment: Faces Pain Score: 4  Faces Pain Scale: Hurts little more Pain Location: Rankle Pain Descriptors / Indicators:  (unable to report) Pain Intervention(s): Limited activity within patient's tolerance;Monitored during session;Premedicated before session;Repositioned     Hand Dominance Right   Extremity/Trunk Assessment Upper Extremity Assessment Upper Extremity Assessment: Overall WFL for tasks assessed   Lower Extremity Assessment Lower Extremity Assessment: RLE deficits/detail RLE Deficits / Details: NWB due to fibular fracture and demonstrates 2+ hip and knee strength   Cervical / Trunk Assessment Cervical / Trunk Assessment: Normal   Communication Communication Communication: Expressive difficulties   Cognition  Arousal/Alertness: Awake/alert Behavior During Therapy: WFL for tasks assessed/performed Overall Cognitive Status: Within Functional Limits for tasks assessed                                 Home Living Family/patient expects to be discharged to:: Private residence Living Arrangements: Alone Available Help at Discharge: Family;Available PRN/intermittently Type of Home: Apartment Home Access: Stairs to enter Entrance Stairs-Number of Steps: 1 and 1 Entrance Stairs-Rails: None Home Layout: One level     Bathroom Shower/Tub: Tub/shower unit Shower/tub characteristics: Curtain Biochemist, clinical: Standard     Home Equipment: Environmental consultant - 2 wheels      Lives With: Alone    Prior Functioning/Environment Level of Independence: Independent with assistive device(s)        Comments: Pt ambulates with RW but reports independence with ADLs.     OT Diagnosis: Generalized weakness;Acute pain;Other (comment) (aphasia)   OT Problem List: Decreased strength;Decreased range of motion;Decreased activity tolerance;Impaired balance (sitting and/or standing);Pain   OT Treatment/Interventions: Self-care/ADL training;Therapeutic exercise;Energy conservation;DME and/or AE instruction;Therapeutic activities;Patient/family education;Balance training    OT Goals(Current goals can be found in the care plan section) Acute Rehab OT Goals Patient Stated Goal: did not state OT Goal Formulation: With patient Time For Goal Achievement: 06/14/14 Potential to Achieve Goals: Good ADL Goals Pt Will Perform Lower Body Bathing: with min assist;sit to/from stand Pt Will Perform Lower Body Dressing: with min assist;sit to/from stand Pt Will Transfer to Toilet: with min assist;stand pivot transfer;bedside commode Pt Will Perform Toileting - Clothing Manipulation and hygiene: with min assist;sit to/from stand Additional ADL Goal #1: Pt will perform bed mobility with Supervision to prepare for ADLs.   OT Frequency: Min 2X/week    End of Session Equipment Utilized During Treatment: Gait belt;Rolling walker Nurse Communication: Mobility status  Activity Tolerance:  Patient tolerated treatment well Patient left: in chair;with call bell/phone within reach;with family/visitor present   Time: [id number] OT Time Calculation (min): 29 min Charges:  OT General Charges $OT Visit: 1 Procedure OT Evaluation $Initial OT Evaluation Tier I: 1 Procedure OT Treatments $Self Care/Home Management : 8-22 mins G-Codes:    Villa Herb M 06/20/14, 2:17 PM  Cyndie Chime, OTR/L Occupational Therapist [phone] (pager)

## 2014-05-31 NOTE — Plan of Care (Signed)
Problem: Acute Rehab PT Goals(only PT should resolve) Goal: Pt Will Transfer Bed To Chair/Chair To Bed LRAD

## 2014-05-31 NOTE — Consult Note (Signed)
Physical Medicine and Rehabilitation Consult Reason for Consult: Left MCA frontal lobe infarct Referring Physician: [facility]   HPI: Kristina Summers is a 79 y.o. right handed female with history of coronary artery disease with CABG, atrial fibrillation not on anticoagulation, diabetes mellitus and peripheral neuropathy as well as being legally blind. Patient lives alone had recently been using a walker due to some falls. Presented 05/29/2014 with altered mental status and slurred speech after being found down lying on the floor after reported fall. MRI of the brain showed acute small left middle cerebral artery territory infarct. MRA of the head with no stenosis or occlusion. Complaints of right ankle pain were x-rays revealed nondisplaced right distal fibula fracture. Echocardiogram is pending. Carotid Dopplers with no ICA stenosis. Patient did not receive TPA. Neurology consulted and placed on Eliquis for CVA prophylaxis in light of history of atrial fibrillation and low dose aspirin. Orthopedic services Dr.Olin Follow-up for right ankle fracture conservative care nonweightbearing at this time. Patient is tolerating a regular consistency diet. Physical therapy evaluation completed 30-Dec-202016 with recommendations of physical medicine rehabilitation consult.   Review of Systems  Eyes:       Legally blind  Gastrointestinal:       GERD  Musculoskeletal: Positive for myalgias and falls.  All other systems reviewed and are negative.  Past Medical History  Diagnosis Date  . CAD (coronary artery disease)   . Obesity   . Atrial fibrillation   . Hypertension   . Diabetes mellitus   . Hyperlipidemia   . Peripheral vascular disease   . GI bleed 1/11  . Carotid artery occlusion   . Normal nuclear stress test 03/18/2011  . Angina   . GERD (gastroesophageal reflux disease)   . Arthritis   . Fibromyalgia   . Myocardial infarction ~ 1991  . UTI (lower urinary tract infection) Sept. 2015   Past  Surgical History  Procedure Laterality Date  . Trigger finger release  1996    right thumb  . Cataract extraction w/ intraocular lens  implant, bilateral  2004-2005  . Coronary artery bypass graft  2000    CABG X5  . Peripheral arterial stent graft  04/01/11    right common iliac  . Angioplasty  06/17/11    Left leg common femoral artery cannulation under u/s Left leg runoff  . Eye surgery    . Abdominal aortagram N/A 04/01/2011    Procedure: ABDOMINAL AORTAGRAM;  Surgeon: Conrad Howe, MD;  Location: [facility] C[facility] LAB;  Service: Cardiovascular;  Laterality: N/A;  . Percutaneous stent intervention Right 04/01/2011    Procedure: PERCUTANEOUS STENT INTERVENTION;  Surgeon: Conrad Cove, MD;  Location: [facility] CATH LAB;  Service: Cardiovascular;  Laterality: Right;  rt iliac stent  . Lower extremity angiogram Bilateral 04/01/2011    Procedure: LOWER EXTREMITY ANGIOGRAM;  Surgeon: Conrad Bremen, MD;  Location: [facility] CATH LAB;  Service: Cardiovascular;  Laterality: Bilateral;  . Lower extremity angiogram Left 06/17/2011    Procedure: LOWER EXTREMITY ANGIOGRAM;  Surgeon: Conrad Mount Jackson, MD;  Location: [facility] CATH LAB;  Service: Cardiovascular;  Laterality: Left;  . Lower extremity angiogram N/A 11/18/2011    Procedure: LOWER EXTREMITY ANGIOGRAM;  Surgeon: Conrad Naples, MD;  Location: [facility] CATH LAB;  Service: Cardiovascular;  Laterality: N/A;   Family History  Problem Relation Age of Onset  . Other Brother     intestinal blockage  . Diabetes Brother    Social History:  reports that she  has quit smoking. Her smoking use included Cigarettes. She has a 60 pack-year smoking history. She has never used smokeless tobacco. She reports that she does not drink alcohol or use illicit drugs. Allergies:  Allergies  Allergen Reactions  . Morphine And Related Nausea And Vomiting    Chest pain    . Lisinopril Cough  . Sertraline Hcl     REACTION: nausea  . Zoloft [Sertraline Hcl] Other (See Comments)    Nausea     Medications Prior to Admission  Medication Sig Dispense Refill  . acetaminophen (TYLENOL) 325 MG tablet Take 2 tablets (650 mg total) by mouth every 6 (six) hours as needed for mild pain (or Fever >/= 101). 60 tablet 1  . amLODipine (NORVASC) 5 MG tablet Take 1 tablet by mouth daily.    Marland Kitchen aspirin 81 MG tablet Take 81 mg by mouth daily.    Marland Kitchen atorvastatin (LIPITOR) 80 MG tablet Take 1 tablet (80 mg total) by mouth daily. 30 tablet 0  . furosemide (LASIX) 40 MG tablet Take 20-60 mg by mouth 2 (two) times daily. Pt takes 1.5 tabs (60mg ) in the morning and 1/2 tablet (20mg ) in the evening    . HUMULIN R 100 UNIT/ML injection Inject 5 Units into the skin at bedtime.     . insulin NPH Human (HUMULIN N,NOVOLIN N) 100 UNIT/ML injection Inject 37 Units into the skin 2 (two) times daily. 38 in am and 34 at night    . losartan (COZAAR) 50 MG tablet Take 50 mg by mouth 2 (two) times daily.      . metoprolol tartrate (LOPRESSOR) 25 MG tablet Take 1 tablet by mouth 2 (two) times daily.    . Multiple Vitamins-Minerals (ONE-A-DAY 50 PLUS PO) Take 1 tablet by mouth daily.    Marland Kitchen NITROSTAT 0.4 MG SL tablet Place 1 tablet under the tongue every 5 (five) minutes x 3 doses as needed. Chest pain  1  . sulfamethoxazole-trimethoprim (BACTRIM DS) 800-160 MG per tablet Take 1 tablet by mouth every 12 (twelve) hours. (Patient not taking: Reported on 04/18/2014) 20 tablet 0    Home: Home Living Family/patient expects to be discharged to:: Private residence Living Arrangements: Alone Available Help at Discharge: Family, Available PRN/intermittently Type of Home: Apartment Home Access: Stairs to enter [employer] of Steps: 1 and 1 Entrance Stairs-Rails: None Home Layout: One level Home Equipment: Environmental consultant - 2 wheels  Lives With: Alone  Functional History: Prior Function Level of Independence: Independent with assistive device(s) Comments: Pt ambulates with RW but reports independence with ADLs.   Functional Status:  Mobility: Bed Mobility Overal bed mobility: Needs Assistance Bed Mobility: Supine to Sit Supine to sit: Min assist, Mod assist General bed mobility comments: mod to first sit up then pt using UE's and scooting with hips more Transfers Overall transfer level: Needs assistance Equipment used: Rolling walker (2 wheeled), 2 person hand held assist Transfers: Sit to/from Stand Sit to Stand: Mod assist, +2 physical assistance, +2 safety/equipment General transfer comment: Pt could stand for 2 minutes but could not control RLE wbing enough to manage to hop a step Ambulation/Gait General Gait Details: unable to step    ADL: ADL Overall ADL's : Needs assistance/impaired Eating/Feeding: Independent, Sitting Grooming: Set up, Sitting Upper Body Bathing: Minimal assitance, Sitting Lower Body Bathing: +2 for physical assistance, Sit to/from stand, Maximal assistance Upper Body Dressing : Minimal assistance, Sitting Lower Body Dressing: Total assistance, +2 for physical assistance, Sit to/from stand Toilet Transfer: Moderate  assistance, +2 for physical assistance, Stand-pivot, RW Toilet Transfer Details (indicate cue type and reason): transferred bed>recliner with VC's to push through UE and scoot LLE over to the chair  Cognition: Cognition Overall Cognitive Status: Within Functional Limits for tasks assessed Arousal/Alertness: Awake/alert Orientation Level: Oriented X4 Attention: Sustained, Selective Sustained Attention: Appears intact Selective Attention: Appears intact Awareness: Appears intact (pt demonstrates frustration with expressive language deficits) Problem Solving: Appears intact Safety/Judgment: Appears intact Cognition Arousal/Alertness: Awake/alert Behavior During Therapy: WFL for tasks assessed/performed Overall Cognitive Status: Within Functional Limits for tasks assessed  Blood pressure 132/56, pulse 83, temperature 97.5 F (36.4 C), temperature  source Oral, resp. rate 20, height 5\' 6"  (1.676 m), weight 89.359 kg (197 lb), SpO2 98 %. Physical Exam  Vitals reviewed. HENT:  Head: Normocephalic.  Eyes:  Pupil sluggish to light with very poor vision  Neck: Normal range of motion. Neck supple. No thyromegaly present.  Cardiovascular: Normal rate and regular rhythm.   Respiratory: Effort normal and breath sounds normal. No respiratory distress.  GI: Soft. Bowel sounds are normal. She exhibits no distension.  Neurological: She is alert.  Speech is dysarthric. She does follow simple commands  Skin:  Right ankle splint in place appropriately tender    Results for orders placed or performed during the hospital encounter of 05/29/14 (from the past 24 hour(s))  Glucose, capillary     Status: Abnormal   Collection Time: 05/30/14  4:24 PM  Result Value Ref Range   Glucose-Capillary 126 (H) 70 - 99 mg/dL   Comment 1 Notify RN   Glucose, capillary     Status: Abnormal   Collection Time: 05/30/14 11:06 PM  Result Value Ref Range   Glucose-Capillary 257 (H) 70 - 99 mg/dL  Glucose, capillary     Status: Abnormal   Collection Time: 05/31/14  6:59 AM  Result Value Ref Range   Glucose-Capillary 199 (H) 70 - 99 mg/dL   Comment 1 Notify RN    Comment 2 Documented in Char   Basic metabolic panel     Status: Abnormal   Collection Time: 05/31/14 10:06 AM  Result Value Ref Range   Sodium 132 (L) 135 - 145 mmol/L   Potassium 3.6 3.5 - 5.1 mmol/L   Chloride 103 96 - 112 mmol/L   CO2 23 19 - 32 mmol/L   Glucose, Bld 298 (H) 70 - 99 mg/dL   BUN 22 6 - 23 mg/dL   Creatinine, Ser 0.84 0.50 - 1.10 mg/dL   Calcium 7.8 (L) 8.4 - 10.5 mg/dL   GFR calc non Af Amer 66 (L) >90 mL/min   GFR calc Af Amer 76 (L) >90 mL/min   Anion gap 6 5 - 15  CBC     Status: Abnormal   Collection Time: 05/31/14 10:06 AM  Result Value Ref Range   WBC 10.8 (H) 4.0 - 10.5 K/uL   RBC 3.72 (L) 3.87 - 5.11 MIL/uL   Hemoglobin 11.9 (L) 12.0 - 15.0 g/dL   HCT 34.7 (L)  36.0 - 46.0 %   MCV 93.3 78.0 - 100.0 fL   MCH 32.0 26.0 - 34.0 pg   MCHC 34.3 30.0 - 36.0 g/dL   RDW 13.2 11.5 - 15.5 %   Platelets 279 150 - 400 K/uL  Lipid panel     Status: Abnormal   Collection Time: 05/31/14 10:06 AM  Result Value Ref Range   Cholesterol 92 0 - 200 mg/dL   Triglycerides 165 (H) <150 mg/dL  HDL 11 (L) >39 mg/dL   Total CHOL/HDL Ratio 8.4 RATIO   VLDL 33 0 - 40 mg/dL   LDL Cholesterol 48 0 - 99 mg/dL  Glucose, capillary     Status: Abnormal   Collection Time: 05/31/14 11:52 AM  Result Value Ref Range   Glucose-Capillary 222 (H) 70 - 99 mg/dL   Comment 1 Documented in Char    Ct Angio Head W/cm &/or Wo Cm  05/30/2014   CLINICAL DATA:  Aphasia and weakness.  Code stroke.  EXAM: CT ANGIOGRAPHY HEAD AND NECK  TECHNIQUE: Multidetector CT imaging of the head and neck was performed using the standard protocol during bolus administration of intravenous contrast. Multiplanar CT image reconstructions and MIPs were obtained to evaluate the vascular anatomy. Carotid stenosis measurements (when applicable) are obtained utilizing NASCET criteria, using the distal internal carotid diameter as the denominator.  CONTRAST:  133mL OMNIPAQUE IOHEXOL 350 MG/ML SOLN  COMPARISON:  None.  FINDINGS: CT HEAD  Brain: There is mild generalized cerebral atrophy, within normal limits for age. There is no evidence of acute cortical infarct, intracranial hemorrhage, mass, midline shift, or extra-axial fluid collection. Periventricular white-matter hypodensities are nonspecific but compatible with mild chronic small vessel ischemic disease. Small foci of well defined low density in the parietal lobes may reflect dilated perivascular spaces or less likely chronic lacunar infarcts.  Calvarium and skull base: No skull fracture or aggressive osseous lesions identified.  Paranasal sinuses: Visualized paranasal sinuses and mastoid air cells are clear.  Orbits: Prior bilateral cataract extraction.  CTA NECK   Aortic arch: 3 vessel aortic arch with extensive atherosclerotic calcification. There is mild stenosis of the proximal brachiocephalic artery. There is heavy calcification throughout the proximal left subclavian artery with mild to moderate stenosis.  Right carotid system: Proximal common carotid artery is partially obscured by streak artifact. There is moderate calcification at the carotid bifurcation without significant common or internal carotid artery stenosis identified. There is mild narrowing of the proximal ECA.  Left carotid system: There is approximately 50% stenosis of the common carotid artery at its origin. There is moderate calcification in the mid to distal common carotid artery extending into the proximal ICA. No significant distal common carotid or internal carotid artery stenosis is present.  Vertebral arteries: Vertebral arteries are patent with the left being mildly dominant. No vertebral artery stenosis is identified.  Skeleton: Sequelae of prior median sternotomy are partially visualized. Advanced multilevel cervical disc degeneration is noted, worst at C3-4 where there is likely moderate spinal stenosis and severe bilateral neural foraminal stenosis.  Other neck: An 8 mm right thyroid nodule is incidentally noted. Biapical lung scarring is noted. Left submandibular gland appears to be absent.  CTA HEAD  Anterior circulation: Internal carotid arteries are patent from skullbase to carotid termini. There is moderate carotid siphon calcification bilaterally resulting in mild bilateral cavernous and supraclinoid carotid stenosis, estimated at approximately 50%. 1.5-2 mm outpouching from the distal right supraclinoid ICA in the posterior communicating artery region likely represents an infundibulum. ACAs and MCAs are unremarkable.  Posterior circulation: Intracranial vertebral arteries are patent to the basilar with the left being dominant. There is mild bilateral vertebral artery calcification  without significant stenosis. Right PICA origin is patent. Left PICA origin is not clearly identified, although it may share a common origin with the left AICA. Bilateral AICA and SCA origins are patent. Basilar artery is patent without stenosis. PCAs are unremarkable.  Venous sinuses: Patent.  Anatomic variants: None.  Delayed  phase: Not performed at request of attending neurologist.  IMPRESSION: 1. No evidence of acute intracranial abnormality. 2. Mild chronic small vessel ischemic disease. 3. No evidence of major intracranial arterial occlusion. Prominent intracranial ICA calcification with mild stenosis bilaterally. 4. Extensive atherosclerotic calcification in the aortic arch with 50% stenosis of the proximal left common carotid artery. 5. Moderate bilateral carotid bifurcation atherosclerosis without significant cervical ICA stenosis. 6. Pain vertebral arteries without stenosis.  Preliminary results were called by telephone at the time of interpretation on 05/30/2014 at 11:25 am to Dr. Leonel Ramsay, who verbally acknowledged these results.   Electronically Signed   By: Logan Bores   On: 05/30/2014 12:12   Ct Angio Neck W/cm &/or Wo/cm  05/30/2014   CLINICAL DATA:  Aphasia and weakness.  Code stroke.  EXAM: CT ANGIOGRAPHY HEAD AND NECK  TECHNIQUE: Multidetector CT imaging of the head and neck was performed using the standard protocol during bolus administration of intravenous contrast. Multiplanar CT image reconstructions and MIPs were obtained to evaluate the vascular anatomy. Carotid stenosis measurements (when applicable) are obtained utilizing NASCET criteria, using the distal internal carotid diameter as the denominator.  CONTRAST:  162mL OMNIPAQUE IOHEXOL 350 MG/ML SOLN  COMPARISON:  None.  FINDINGS: CT HEAD  Brain: There is mild generalized cerebral atrophy, within normal limits for age. There is no evidence of acute cortical infarct, intracranial hemorrhage, mass, midline shift, or extra-axial fluid  collection. Periventricular white-matter hypodensities are nonspecific but compatible with mild chronic small vessel ischemic disease. Small foci of well defined low density in the parietal lobes may reflect dilated perivascular spaces or less likely chronic lacunar infarcts.  Calvarium and skull base: No skull fracture or aggressive osseous lesions identified.  Paranasal sinuses: Visualized paranasal sinuses and mastoid air cells are clear.  Orbits: Prior bilateral cataract extraction.  CTA NECK  Aortic arch: 3 vessel aortic arch with extensive atherosclerotic calcification. There is mild stenosis of the proximal brachiocephalic artery. There is heavy calcification throughout the proximal left subclavian artery with mild to moderate stenosis.  Right carotid system: Proximal common carotid artery is partially obscured by streak artifact. There is moderate calcification at the carotid bifurcation without significant common or internal carotid artery stenosis identified. There is mild narrowing of the proximal ECA.  Left carotid system: There is approximately 50% stenosis of the common carotid artery at its origin. There is moderate calcification in the mid to distal common carotid artery extending into the proximal ICA. No significant distal common carotid or internal carotid artery stenosis is present.  Vertebral arteries: Vertebral arteries are patent with the left being mildly dominant. No vertebral artery stenosis is identified.  Skeleton: Sequelae of prior median sternotomy are partially visualized. Advanced multilevel cervical disc degeneration is noted, worst at C3-4 where there is likely moderate spinal stenosis and severe bilateral neural foraminal stenosis.  Other neck: An 8 mm right thyroid nodule is incidentally noted. Biapical lung scarring is noted. Left submandibular gland appears to be absent.  CTA HEAD  Anterior circulation: Internal carotid arteries are patent from skullbase to carotid termini.  There is moderate carotid siphon calcification bilaterally resulting in mild bilateral cavernous and supraclinoid carotid stenosis, estimated at approximately 50%. 1.5-2 mm outpouching from the distal right supraclinoid ICA in the posterior communicating artery region likely represents an infundibulum. ACAs and MCAs are unremarkable.  Posterior circulation: Intracranial vertebral arteries are patent to the basilar with the left being dominant. There is mild bilateral vertebral artery calcification without significant stenosis. Right PICA  origin is patent. Left PICA origin is not clearly identified, although it may share a common origin with the left AICA. Bilateral AICA and SCA origins are patent. Basilar artery is patent without stenosis. PCAs are unremarkable.  Venous sinuses: Patent.  Anatomic variants: None.  Delayed phase: Not performed at request of attending neurologist.  IMPRESSION: 1. No evidence of acute intracranial abnormality. 2. Mild chronic small vessel ischemic disease. 3. No evidence of major intracranial arterial occlusion. Prominent intracranial ICA calcification with mild stenosis bilaterally. 4. Extensive atherosclerotic calcification in the aortic arch with 50% stenosis of the proximal left common carotid artery. 5. Moderate bilateral carotid bifurcation atherosclerosis without significant cervical ICA stenosis. 6. Pain vertebral arteries without stenosis.  Preliminary results were called by telephone at the time of interpretation on 05/30/2014 at 11:25 am to Dr. Leonel Ramsay, who verbally acknowledged these results.   Electronically Signed   By: Logan Bores   On: 05/30/2014 12:12   Mr Brain Wo Contrast  05/30/2014   CLINICAL DATA:  Status post fall with ankle fracture, altered mental status while in the hospital, difficulty speaking. History of atrial fibrillation, status post ablation. History of diabetes, hypertension, hyperlipidemia, carotid artery occlusion.  EXAM: MRI HEAD WITHOUT  CONTRAST  MRA HEAD WITHOUT CONTRAST  TECHNIQUE: Multiplanar, multiecho pulse sequences of the brain and surrounding structures were obtained without intravenous contrast. Angiographic images of the head were obtained using MRA technique without contrast.  COMPARISON:  CT of the head May 30, 2014 at 11:08 a.m.  FINDINGS: MRI HEAD FINDINGS  Mildly motion degraded examination.  3.9 x 1.9 cm area reduced diffusion within LEFT frontal lobe extending to the cortex, corresponding low ADC values. Very minimal T2 hyperintense signal. No susceptibility artifact to suggest hemorrhage.  Ventricles and sulci are overall normal for patient's age. No midline shift, mass effect or mass lesions. Mild white matter changes can be seen with chronic small vessel ischemic disease, less than expected for age.  No abnormal extra-axial fluid collections. Status post bilateral ocular lens implants. Paranasal sinuses and mastoid air cells appear well-aerated. Patient is edentulous. No abnormal sellar expansion. Cerebellar tonsils at but not below the foramen magnum. No suspicious calvarial bone marrow signal.  MRA HEAD FINDINGS  Moderately motion degraded examination.  Anterior circulation: Normal flow related enhancement of the included cervical, petrous, cavernous and supra clinoid internal carotid arteries. Patent anterior communicating artery. Normal flow related enhancement of the anterior and middle cerebral arteries, limited assessment of distal segments due to motion.  No large vessel occlusion, central high-grade stenosis.  Posterior circulation: LEFT vertebral artery is dominant. Basilar artery is patent, with normal flow related enhancement of the main branch vessels. Normal flow related enhancement of the posterior cerebral arteries.  No large vessel occlusion, central high-grade stenosis.  IMPRESSION: MRI HEAD: Acute small LEFT middle cerebral artery territory infarct (LEFT frontal lobe).  Otherwise normal MRI of the brain  for age.  MRA HEAD: Moderately motion degraded examination without central large vessel occlusion. Distal thromboembolic disease would not be appreciated on this motion degraded examination.  These results will be called to the ordering clinician or representative by the Radiologist Assistant, and communication documented in the PACS or zVision Dashboard.   Electronically Signed   By: Elon Alas   On: 05/30/2014 22:52   Mr Jodene Nam Headm  05/30/2014   CLINICAL DATA:  Status post fall with ankle fracture, altered mental status while in the hospital, difficulty speaking. History of atrial fibrillation, status post ablation. History  of diabetes, hypertension, hyperlipidemia, carotid artery occlusion.  EXAM: MRI HEAD WITHOUT CONTRAST  MRA HEAD WITHOUT CONTRAST  TECHNIQUE: Multiplanar, multiecho pulse sequences of the brain and surrounding structures were obtained without intravenous contrast. Angiographic images of the head were obtained using MRA technique without contrast.  COMPARISON:  CT of the head May 30, 2014 at 11:08 a.m.  FINDINGS: MRI HEAD FINDINGS  Mildly motion degraded examination.  3.9 x 1.9 cm area reduced diffusion within LEFT frontal lobe extending to the cortex, corresponding low ADC values. Very minimal T2 hyperintense signal. No susceptibility artifact to suggest hemorrhage.  Ventricles and sulci are overall normal for patient's age. No midline shift, mass effect or mass lesions. Mild white matter changes can be seen with chronic small vessel ischemic disease, less than expected for age.  No abnormal extra-axial fluid collections. Status post bilateral ocular lens implants. Paranasal sinuses and mastoid air cells appear well-aerated. Patient is edentulous. No abnormal sellar expansion. Cerebellar tonsils at but not below the foramen magnum. No suspicious calvarial bone marrow signal.  MRA HEAD FINDINGS  Moderately motion degraded examination.  Anterior circulation: Normal flow related  enhancement of the included cervical, petrous, cavernous and supra clinoid internal carotid arteries. Patent anterior communicating artery. Normal flow related enhancement of the anterior and middle cerebral arteries, limited assessment of distal segments due to motion.  No large vessel occlusion, central high-grade stenosis.  Posterior circulation: LEFT vertebral artery is dominant. Basilar artery is patent, with normal flow related enhancement of the main branch vessels. Normal flow related enhancement of the posterior cerebral arteries.  No large vessel occlusion, central high-grade stenosis.  IMPRESSION: MRI HEAD: Acute small LEFT middle cerebral artery territory infarct (LEFT frontal lobe).  Otherwise normal MRI of the brain for age.  MRA HEAD: Moderately motion degraded examination without central large vessel occlusion. Distal thromboembolic disease would not be appreciated on this motion degraded examination.  These results will be called to the ordering clinician or representative by the Radiologist Assistant, and communication documented in the PACS or zVision Dashboard.   Electronically Signed   By: Elon Alas   On: 05/30/2014 22:52    Assessment/Plan: Diagnosis: left MCA infarct, right fibular fx 1. Does the need for close, 24 hr/day medical supervision in concert with the patient's rehab needs make it unreasonable for this patient to be served in a less intensive setting? Yes 2. Co-Morbidities requiring supervision/potential complications: legally blind, CAD, PAF, DM, obesity 3. Due to bladder management, bowel management, safety, skin/wound care, disease management, medication administration, pain management and patient education, does the patient require 24 hr/day rehab nursing? Yes 4. Does the patient require coordinated care of a physician, rehab nurse, PT (1-2 hrs/day, 5 days/week), OT (1-2 hrs/day, 5 days/week) and SLP (1-2 hrs/day, 5 days/week) to address physical and functional  deficits in the context of the above medical diagnosis(es)? Yes Addressing deficits in the following areas: balance, endurance, locomotion, strength, transferring, bowel/bladder control, bathing, dressing, feeding, grooming, toileting, cognition, speech, language and swallowing 5. Can the patient actively participate in an intensive therapy program of at least 3 hrs of therapy per day at least 5 days per week? Yes 6. The potential for patient to make measurable gains while on inpatient rehab is good 7. Anticipated functional outcomes upon discharge from inpatient rehab are supervision and min assist  with PT, supervision and min assist with OT, supervision and min assist with SLP. 8. Estimated rehab length of stay to reach the above functional goals is: 10-14  days 9. Does the patient have adequate social supports and living environment to accommodate these discharge functional goals? No and Potentially 10. Anticipated D/C setting: Home 11. Anticipated post D/C treatments: HH therapy and Outpatient therapy 12. Overall Rehab/Functional Prognosis: good  RECOMMENDATIONS: This patient's condition is appropriate for continued rehabilitative care in the following setting: CIR Patient has agreed to participate in recommended program. Potentially Note that insurance prior authorization may be required for reimbursement for recommended care.  Comment: Rehab Admissions Coordinator to follow up.  Thanks,  Meredith Staggers, MD, [facility] Drown     March 11, 202016

## 2014-05-31 NOTE — Progress Notes (Signed)
Patient ID: Kristina Summers  female  [id number]    DOB: [birth year]    DOA: 05/29/2014  PCP: [name], MD  Brief history of present illness 79 y.o. female with a past medical history of poorly controlled diabetes, coronary artery disease status post CABG, atrial fibrillation not on anticoagulation due to history of bleeding, history a few falls. She presented to [facility] long hospital status post fall, she was found lying on the floor, apparently she was ambulating to the bathroom and lost her balance and fell down. On admission, patient was hemodynamically stable. She did have a fever of 101.47F. Blood work was significant for leukocytosis of 16.4, hemoglobin 11.5, CK 994, creatinine 1.42, troponin elevation at 0.04. She was found to have right fibular fracture. Orthopedics was consulted, Ortho placed splint, no plan for surgical intervention. In addition, she was found to have UTI based on urinalysis. She was started on empiric Rocephin. On 2/11, patient was found to have some trouble finding her words but was able to respond. Patient was evaluated by neurology and recommended to transfer to [facility] for stroke workup due to expressive aphasia, sudden onset.  Assessment/Plan: Principal Problem:  Acute CVA (cerebral infarction) with sudden onset expressive aphasia: No significant improvement - MRI of the brain showed small left middle cerebral artery territory infarct, left frontal lobe - MRA showed no central large vessel occlusion, distal thromboembolic disease - 2-D echo pending - Initial CT angiogram head was negative for CVA, extensive atherosclerotic calcification in the aortic arch with 50% stenosis in proximal left common carotid artery. - Carotid Dopplers showed 1-39% ICA stenosis -  Speech therapy following, recommending CIR versus skilled nursing facility  - Lipid panel showed LDL 48  - Was on aspirin, however due to history of atrial fibrillation, cardiology recommended  anticoagulation. She had prior GI bleeding with supratherapeutic INR, fall risk. Given new CVA and atrial fibrillation, cardiology, Dr. [name] recommended starting eliquis, discussed with Dr. [name] who agrees    -  2-D echo pending  -PT, OT evaluation     Active Problems: UTI - Urine culture showed no growth, but continue total of 3 days of IV Rocephin   Sepsis/ SIRS secondary to UTI -Currently improving, continue IV Rocephin  Right distal fibular fracture - Appreciate orthopedics recommendations, PT evaluation   Paroxysmal atrial fibrillation - Not on anticoagulation because of history of bleeding, was on daily aspirin prior to admission. - Continue metoprolol for rate control, started on eliquis per radiology and neurology recommendations, and new CVA. Hopefully patient's fall risk will significantly be lowered with supervision at skilled nursing facility.     coronary artery disease, status post CABG / troponin elevation - Troponin mildly elevated on admission, 0.04. Patient did not have complaints of chest pain. Likely demand ischemia from UTI, fall and fibular fracture.  - Cardiology was consulted, awaiting 2-D echocardiogram.   Diabetes mellitus with peripheral artery disease uncontrolled - A1c on this admission is 10.4 indicating poor glycemic control. - Increased NPH insulin 30 units twice daily along with sliding scale insulin.   Essential hypertension - Continue Norvasc, losartan and metoprolol  Acute renal failure - Resolved, Lasix currently on hold   Rhabdomyolysis - CK level 994 on the admission, improved, renal function improved patient received gentle hydration on admission.  Anemia of chronic disease - Currently stable    DVT Prophylaxis:heparin subcutaneous   Code Status: full code   Family Communication: discussed in detail with patient's daughter and son-in-law at  the bedside   Disposition: will likely need skilled nursing facility versus [facility] Dr. [name] Neurology, Dr. [name]   Procedures  MRI, MRA brain CT angiogram of the head and neck Carotid Dopplers  Antibiotics IV Rocephin   Subjective  patient seen and examined, still has significant dysarthria, family at the bedside   Objective  Weight change:   Intake/Output Summary (Last 24 hours) at 05/31/14 1309 Last data filed at 05/30/14 2100  Gross per 24 hour  Intake      0 ml  Output      2 ml  Net     -2 ml   Blood pressure 132/56, pulse 83, temperature 97.5 F (36.4 C), temperature source Oral, resp. rate 20, height 5\' 6"  (1.676 m), weight 89.359 kg (197 lb), SpO2 98 %.  Physical Exam: General: Alert and awake, orient, expressive aphasia CVA: Irregularly irregular  Chest: clear to auscultation bilaterally, no wheezing, rales or rhonchi Abdomen: soft nontender, nondistended, normal bowel sounds  Extremities: no cyanosis, clubbing or edema noted bilaterally, right leg dressing intact Neuro: Cranial nerves II-XII intact, no focal neurological deficits  Lab Results: Basic Metabolic Panel:  Recent Labs Lab 05/30/14 0254 05/31/14 1006  NA 131* 132*  K 3.6 3.6  CL 101 103  CO2 22 23  GLUCOSE 252* 298*  BUN 44* 22  CREATININE 1.42* 0.84  CALCIUM 8.0* 7.8*   Liver Function Tests:  Recent Labs Lab 05/29/14 1110 05/30/14 0254  AST 103* 104*  ALT 59* 64*  ALKPHOS 131* 106  BILITOT 1.1 0.9  PROT 7.7 6.2  ALBUMIN 2.9* 2.3*   No results for input(s): LIPASE, AMYLASE in the last 168 hours. No results for input(s): AMMONIA in the last 168 hours. CBC:  Recent Labs Lab 05/29/14 1110 05/30/14 0254 05/31/14 1006  WBC 16.4* 13.2* 10.8*  NEUTROABS 13.8*  --   --   HGB 13.6 11.5* 11.9*  HCT 38.8 34.1* 34.7*  MCV 91.9 93.7 93.3  PLT 254 236 279   Cardiac Enzymes:  Recent Labs Lab 05/29/14 1110 05/29/14 1548 05/29/14 2108 05/30/14 0253 05/30/14 0254 05/30/14 1204  CKTOTAL 994*  --   --   --  454* 289*   TROPONINI 0.04* 0.04* 0.05* 0.06*  --   --    BNP: Invalid input(s): POCBNP CBG:  Recent Labs Lab 05/30/14 1130 05/30/14 1624 05/30/14 2306 05/31/14 0659 05/31/14 1152  GLUCAP 300* 126* 257* 199* 222*     Micro Results: Recent Results (from the past 240 hour(s))  Urine culture     Status: None   Collection Time: 05/30/14  4:00 AM  Result Value Ref Range Status   Specimen Description URINE, CATHETERIZED  Final   Special Requests NONE  Final   Colony Count NO GROWTH Performed at [facility]   Final   Culture NO GROWTH Performed at [facility]   Final   Report Status Jan 15, 202016 FINAL  Final    Studies/Results: Ct Angio Head W/cm &/or Wo Cm  05/30/2014   CLINICAL DATA:  Aphasia and weakness.  Code stroke.  EXAM: CT ANGIOGRAPHY HEAD AND NECK  TECHNIQUE: Multidetector CT imaging of the head and neck was performed using the standard protocol during bolus administration of intravenous contrast. Multiplanar CT image reconstructions and MIPs were obtained to evaluate the vascular anatomy. Carotid stenosis measurements (when applicable) are obtained utilizing NASCET criteria, using the distal internal carotid diameter as the denominator.  CONTRAST:  150mL OMNIPAQUE IOHEXOL 350  MG/ML SOLN  COMPARISON:  None.  FINDINGS: CT HEAD  Brain: There is mild generalized cerebral atrophy, within normal limits for age. There is no evidence of acute cortical infarct, intracranial hemorrhage, mass, midline shift, or extra-axial fluid collection. Periventricular white-matter hypodensities are nonspecific but compatible with mild chronic small vessel ischemic disease. Small foci of well defined low density in the parietal lobes may reflect dilated perivascular spaces or less likely chronic lacunar infarcts.  Calvarium and skull base: No skull fracture or aggressive osseous lesions identified.  Paranasal sinuses: Visualized paranasal sinuses and mastoid air cells are clear.  Orbits: Prior  bilateral cataract extraction.  CTA NECK  Aortic arch: 3 vessel aortic arch with extensive atherosclerotic calcification. There is mild stenosis of the proximal brachiocephalic artery. There is heavy calcification throughout the proximal left subclavian artery with mild to moderate stenosis.  Right carotid system: Proximal common carotid artery is partially obscured by streak artifact. There is moderate calcification at the carotid bifurcation without significant common or internal carotid artery stenosis identified. There is mild narrowing of the proximal ECA.  Left carotid system: There is approximately 50% stenosis of the common carotid artery at its origin. There is moderate calcification in the mid to distal common carotid artery extending into the proximal ICA. No significant distal common carotid or internal carotid artery stenosis is present.  Vertebral arteries: Vertebral arteries are patent with the left being mildly dominant. No vertebral artery stenosis is identified.  Skeleton: Sequelae of prior median sternotomy are partially visualized. Advanced multilevel cervical disc degeneration is noted, worst at C3-4 where there is likely moderate spinal stenosis and severe bilateral neural foraminal stenosis.  Other neck: An 8 mm right thyroid nodule is incidentally noted. Biapical lung scarring is noted. Left submandibular gland appears to be absent.  CTA HEAD  Anterior circulation: Internal carotid arteries are patent from skullbase to carotid termini. There is moderate carotid siphon calcification bilaterally resulting in mild bilateral cavernous and supraclinoid carotid stenosis, estimated at approximately 50%. 1.5-2 mm outpouching from the distal right supraclinoid ICA in the posterior communicating artery region likely represents an infundibulum. ACAs and MCAs are unremarkable.  Posterior circulation: Intracranial vertebral arteries are patent to the basilar with the left being dominant. There is mild  bilateral vertebral artery calcification without significant stenosis. Right PICA origin is patent. Left PICA origin is not clearly identified, although it may share a common origin with the left AICA. Bilateral AICA and SCA origins are patent. Basilar artery is patent without stenosis. PCAs are unremarkable.  Venous sinuses: Patent.  Anatomic variants: None.  Delayed phase: Not performed at request of attending neurologist.  IMPRESSION: 1. No evidence of acute intracranial abnormality. 2. Mild chronic small vessel ischemic disease. 3. No evidence of major intracranial arterial occlusion. Prominent intracranial ICA calcification with mild stenosis bilaterally. 4. Extensive atherosclerotic calcification in the aortic arch with 50% stenosis of the proximal left common carotid artery. 5. Moderate bilateral carotid bifurcation atherosclerosis without significant cervical ICA stenosis. 6. Pain vertebral arteries without stenosis.  Preliminary results were called by telephone at the time of interpretation on 05/30/2014 at 11:25 am to Dr. Leonel Ramsay, who verbally acknowledged these results.   Electronically Signed   By: Logan Bores   On: 05/30/2014 12:12   Dg Ankle Complete Right  05/29/2014   CLINICAL DATA:  Fall in bathroom with right ankle pain. Initial encounter  EXAM: RIGHT ANKLE - COMPLETE 3+ VIEW  COMPARISON:  None.  FINDINGS: Nondisplaced oblique fracture of the distal  fibula, above the ankle joint. There is no medial malleolus fracture or ankle mortise widening.  Osteopenia.  IMPRESSION: Nondisplaced distal fibula fracture.   Electronically Signed   By: Monte Fantasia M.D.   On: 05/29/2014 11:52   Ct Angio Neck W/cm &/or Wo/cm  05/30/2014   CLINICAL DATA:  Aphasia and weakness.  Code stroke.  EXAM: CT ANGIOGRAPHY HEAD AND NECK  TECHNIQUE: Multidetector CT imaging of the head and neck was performed using the standard protocol during bolus administration of intravenous contrast. Multiplanar CT image  reconstructions and MIPs were obtained to evaluate the vascular anatomy. Carotid stenosis measurements (when applicable) are obtained utilizing NASCET criteria, using the distal internal carotid diameter as the denominator.  CONTRAST:  111mL OMNIPAQUE IOHEXOL 350 MG/ML SOLN  COMPARISON:  None.  FINDINGS: CT HEAD  Brain: There is mild generalized cerebral atrophy, within normal limits for age. There is no evidence of acute cortical infarct, intracranial hemorrhage, mass, midline shift, or extra-axial fluid collection. Periventricular white-matter hypodensities are nonspecific but compatible with mild chronic small vessel ischemic disease. Small foci of well defined low density in the parietal lobes may reflect dilated perivascular spaces or less likely chronic lacunar infarcts.  Calvarium and skull base: No skull fracture or aggressive osseous lesions identified.  Paranasal sinuses: Visualized paranasal sinuses and mastoid air cells are clear.  Orbits: Prior bilateral cataract extraction.  CTA NECK  Aortic arch: 3 vessel aortic arch with extensive atherosclerotic calcification. There is mild stenosis of the proximal brachiocephalic artery. There is heavy calcification throughout the proximal left subclavian artery with mild to moderate stenosis.  Right carotid system: Proximal common carotid artery is partially obscured by streak artifact. There is moderate calcification at the carotid bifurcation without significant common or internal carotid artery stenosis identified. There is mild narrowing of the proximal ECA.  Left carotid system: There is approximately 50% stenosis of the common carotid artery at its origin. There is moderate calcification in the mid to distal common carotid artery extending into the proximal ICA. No significant distal common carotid or internal carotid artery stenosis is present.  Vertebral arteries: Vertebral arteries are patent with the left being mildly dominant. No vertebral artery  stenosis is identified.  Skeleton: Sequelae of prior median sternotomy are partially visualized. Advanced multilevel cervical disc degeneration is noted, worst at C3-4 where there is likely moderate spinal stenosis and severe bilateral neural foraminal stenosis.  Other neck: An 8 mm right thyroid nodule is incidentally noted. Biapical lung scarring is noted. Left submandibular gland appears to be absent.  CTA HEAD  Anterior circulation: Internal carotid arteries are patent from skullbase to carotid termini. There is moderate carotid siphon calcification bilaterally resulting in mild bilateral cavernous and supraclinoid carotid stenosis, estimated at approximately 50%. 1.5-2 mm outpouching from the distal right supraclinoid ICA in the posterior communicating artery region likely represents an infundibulum. ACAs and MCAs are unremarkable.  Posterior circulation: Intracranial vertebral arteries are patent to the basilar with the left being dominant. There is mild bilateral vertebral artery calcification without significant stenosis. Right PICA origin is patent. Left PICA origin is not clearly identified, although it may share a common origin with the left AICA. Bilateral AICA and SCA origins are patent. Basilar artery is patent without stenosis. PCAs are unremarkable.  Venous sinuses: Patent.  Anatomic variants: None.  Delayed phase: Not performed at request of attending neurologist.  IMPRESSION: 1. No evidence of acute intracranial abnormality. 2. Mild chronic small vessel ischemic disease. 3. No evidence of major  intracranial arterial occlusion. Prominent intracranial ICA calcification with mild stenosis bilaterally. 4. Extensive atherosclerotic calcification in the aortic arch with 50% stenosis of the proximal left common carotid artery. 5. Moderate bilateral carotid bifurcation atherosclerosis without significant cervical ICA stenosis. 6. Pain vertebral arteries without stenosis.  Preliminary results were called by  telephone at the time of interpretation on 05/30/2014 at 11:25 am to Dr. Leonel Ramsay, who verbally acknowledged these results.   Electronically Signed   By: Logan Bores   On: 05/30/2014 12:12   Mr Brain Wo Contrast  05/30/2014   CLINICAL DATA:  Status post fall with ankle fracture, altered mental status while in the hospital, difficulty speaking. History of atrial fibrillation, status post ablation. History of diabetes, hypertension, hyperlipidemia, carotid artery occlusion.  EXAM: MRI HEAD WITHOUT CONTRAST  MRA HEAD WITHOUT CONTRAST  TECHNIQUE: Multiplanar, multiecho pulse sequences of the brain and surrounding structures were obtained without intravenous contrast. Angiographic images of the head were obtained using MRA technique without contrast.  COMPARISON:  CT of the head May 30, 2014 at 11:08 a.m.  FINDINGS: MRI HEAD FINDINGS  Mildly motion degraded examination.  3.9 x 1.9 cm area reduced diffusion within LEFT frontal lobe extending to the cortex, corresponding low ADC values. Very minimal T2 hyperintense signal. No susceptibility artifact to suggest hemorrhage.  Ventricles and sulci are overall normal for patient's age. No midline shift, mass effect or mass lesions. Mild white matter changes can be seen with chronic small vessel ischemic disease, less than expected for age.  No abnormal extra-axial fluid collections. Status post bilateral ocular lens implants. Paranasal sinuses and mastoid air cells appear well-aerated. Patient is edentulous. No abnormal sellar expansion. Cerebellar tonsils at but not below the foramen magnum. No suspicious calvarial bone marrow signal.  MRA HEAD FINDINGS  Moderately motion degraded examination.  Anterior circulation: Normal flow related enhancement of the included cervical, petrous, cavernous and supra clinoid internal carotid arteries. Patent anterior communicating artery. Normal flow related enhancement of the anterior and middle cerebral arteries, limited  assessment of distal segments due to motion.  No large vessel occlusion, central high-grade stenosis.  Posterior circulation: LEFT vertebral artery is dominant. Basilar artery is patent, with normal flow related enhancement of the main branch vessels. Normal flow related enhancement of the posterior cerebral arteries.  No large vessel occlusion, central high-grade stenosis.  IMPRESSION: MRI HEAD: Acute small LEFT middle cerebral artery territory infarct (LEFT frontal lobe).  Otherwise normal MRI of the brain for age.  MRA HEAD: Moderately motion degraded examination without central large vessel occlusion. Distal thromboembolic disease would not be appreciated on this motion degraded examination.  These results will be called to the ordering clinician or representative by the Radiologist Assistant, and communication documented in the PACS or zVision Dashboard.   Electronically Signed   By: Elon Alas   On: 05/30/2014 22:52   Mr Jodene Nam Headm  05/30/2014   CLINICAL DATA:  Status post fall with ankle fracture, altered mental status while in the hospital, difficulty speaking. History of atrial fibrillation, status post ablation. History of diabetes, hypertension, hyperlipidemia, carotid artery occlusion.  EXAM: MRI HEAD WITHOUT CONTRAST  MRA HEAD WITHOUT CONTRAST  TECHNIQUE: Multiplanar, multiecho pulse sequences of the brain and surrounding structures were obtained without intravenous contrast. Angiographic images of the head were obtained using MRA technique without contrast.  COMPARISON:  CT of the head May 30, 2014 at 11:08 a.m.  FINDINGS: MRI HEAD FINDINGS  Mildly motion degraded examination.  3.9 x 1.9 cm area  reduced diffusion within LEFT frontal lobe extending to the cortex, corresponding low ADC values. Very minimal T2 hyperintense signal. No susceptibility artifact to suggest hemorrhage.  Ventricles and sulci are overall normal for patient's age. No midline shift, mass effect or mass lesions. Mild  white matter changes can be seen with chronic small vessel ischemic disease, less than expected for age.  No abnormal extra-axial fluid collections. Status post bilateral ocular lens implants. Paranasal sinuses and mastoid air cells appear well-aerated. Patient is edentulous. No abnormal sellar expansion. Cerebellar tonsils at but not below the foramen magnum. No suspicious calvarial bone marrow signal.  MRA HEAD FINDINGS  Moderately motion degraded examination.  Anterior circulation: Normal flow related enhancement of the included cervical, petrous, cavernous and supra clinoid internal carotid arteries. Patent anterior communicating artery. Normal flow related enhancement of the anterior and middle cerebral arteries, limited assessment of distal segments due to motion.  No large vessel occlusion, central high-grade stenosis.  Posterior circulation: LEFT vertebral artery is dominant. Basilar artery is patent, with normal flow related enhancement of the main branch vessels. Normal flow related enhancement of the posterior cerebral arteries.  No large vessel occlusion, central high-grade stenosis.  IMPRESSION: MRI HEAD: Acute small LEFT middle cerebral artery territory infarct (LEFT frontal lobe).  Otherwise normal MRI of the brain for age.  MRA HEAD: Moderately motion degraded examination without central large vessel occlusion. Distal thromboembolic disease would not be appreciated on this motion degraded examination.  These results will be called to the ordering clinician or representative by the Radiologist Assistant, and communication documented in the PACS or zVision Dashboard.   Electronically Signed   By: Elon Alas   On: 05/30/2014 22:52   Dg Hip Unilat With Pelvis 2-3 Views Right  05/29/2014   CLINICAL DATA:  Fall in bathroom with right hip and ankle pain. Initial encounter  EXAM: RIGHT HIP (WITH PELVIS) 2-3 VIEWS  COMPARISON:  None.  FINDINGS: When accounting for marginal spurring, there is no  evidence of femoral neck or other hip fracture. The hips are located. There is mild degenerative spurring about both hips without asymmetric joint narrowing. No evidence of pelvic ring fracture or diastasis.  IMPRESSION: No acute osseous findings.   Electronically Signed   By: Monte Fantasia M.D.   On: 05/29/2014 11:54    Medications: Scheduled Meds: . amLODipine  5 mg Oral Daily  . aspirin  81 mg Oral Daily  . atorvastatin  80 mg Oral Daily  . cefTRIAXone (ROCEPHIN)  IV  1 g Intravenous Q24H  . docusate sodium  100 mg Oral BID  . heparin  5,000 Units Subcutaneous 3 times per day  . insulin aspart  0-15 Units Subcutaneous TID WC  . insulin aspart  0-5 Units Subcutaneous QHS  . insulin NPH Human  30 Units Subcutaneous BID AC & HS  . metoprolol tartrate  25 mg Oral BID  . sodium chloride  3 mL Intravenous Q12H    time spent 35 minutes   LOS: 2 days   RAI,RIPUDEEP M.D. [facility] 06/03/202016, 1:09 PM Pager: [phone]  If 7PM-7AM, please contact night-coverage [email or web address] Password [email or web address]

## 2014-05-31 NOTE — Care Management Note (Signed)
    Page 1 of 1   November 09, 202016     2:55:47 PM CARE MANAGEMENT NOTE November 09, 202016  Patient:  Kristina Summers, Kristina Summers   Account Number:  [id number]  Date Initiated:  0November 09, 202016  Documentation initiated by:  Lorne Skeens  Subjective/Objective Assessment:   Patient was admitted with CVA, UTI.  Lives at home alone.     Action/Plan:   Will follow for discharge needs pending PT/OT evals and physician orders.   Anticipated DC Date:     Anticipated DC Plan:  SKILLED NURSING FACILITY  In-house referral  Clinical Social Worker         Choice offered to / List presented to:             Status of service:  In process, will continue to follow Medicare Important Message given?  YES (If response is "NO", the following Medicare IM given date fields will be blank) Date Medicare IM given:  0November 09, 202016 Medicare IM given by:  Lorne Skeens Date Additional Medicare IM given:   Additional Medicare IM given by:    Discharge Disposition:    Per UR Regulation:  Reviewed for med. necessity/level of care/duration of stay  If discussed at [facility] of Stay Meetings, dates discussed:    Comments:  05/31/14 [place], MSN, CM- Medicare IM letter provided.

## 2014-05-31 NOTE — Progress Notes (Signed)
STROKE TEAM PROGRESS NOTE   HISTORY Kristina Summers is a 79 y.o. female with a history of afib s/p ablation procedures no longer on anti-coagulation who presented following a fall with ankle fracture. This is being managed conservatively.   She was "spacey" when the nurse spoke with her this morning and seemed to have some trouble finding her words, but was able to respond. She states that she was having some difficulty speaking even the first time that she spoke.   When her daughter saw her around 8am, she noticed she was off, but thought it was pain medication. She subsequently saw her again around 9:45 am and she was having difficulty speaking.   NIHSS: 6(2 for questions, 1 for gaze preference, 2 for aphasia, 1 for dysarthria)  She was LKW: 2/10 prior to bed.   Patient was not administered TPA secondary to delay in arrival. She was admitted for further evaluation and treatment.   SUBJECTIVE (INTERVAL HISTORY) Her RN is at the bedside.  Overall she feels her condition is gradually improving. She had atrial fibrillation on admission EKG. She was felt previously to be too high risk for anticoagulation but now she is likely going to go to rehabilitation and supervised environment and hence cardiologist feel she needs to be anticoagulated.   OBJECTIVE Temp:  [97.5 F (36.4 C)-98.6 F (37 C)] 97.5 F (36.4 C) (02/12 1008) Pulse Rate:  [70-103] 83 (02/12 1008) Cardiac Rhythm:  [-] Atrial fibrillation (02/11 2000) Resp:  [18-20] 20 (02/12 1008) BP: (117-147)/(46-65) 132/56 mmHg (02/12 1008) SpO2:  [94 %-98 %] 98 % (02/12 1008)   Recent Labs Lab 05/30/14 1112 05/30/14 1130 05/30/14 1624 05/30/14 2306 05/31/14 0659  GLUCAP 339* 300* 126* 257* 199*    Recent Labs Lab 05/29/14 1110 05/30/14 0254 05/31/14 1006  NA 130* 131* 132*  K 3.5 3.6 3.6  CL 97 101 103  CO2 24 22 23   GLUCOSE 350* 252* 298*  BUN 38* 44* 22  CREATININE 0.89 1.42* 0.84  CALCIUM 9.0 8.0* 7.8*    Recent  Labs Lab 05/29/14 1110 05/30/14 0254  AST 103* 104*  ALT 59* 64*  ALKPHOS 131* 106  BILITOT 1.1 0.9  PROT 7.7 6.2  ALBUMIN 2.9* 2.3*    Recent Labs Lab 05/29/14 1110 05/30/14 0254 05/31/14 1006  WBC 16.4* 13.2* 10.8*  NEUTROABS 13.8*  --   --   HGB 13.6 11.5* 11.9*  HCT 38.8 34.1* 34.7*  MCV 91.9 93.7 93.3  PLT 254 236 279    Recent Labs Lab 05/29/14 1110 05/29/14 1548 05/29/14 2108 05/30/14 0253 05/30/14 0254 05/30/14 1204  CKTOTAL 994*  --   --   --  454* 289*  TROPONINI 0.04* 0.04* 0.05* 0.06*  --   --    No results for input(s): LABPROT, INR in the last 72 hours.  Recent Labs  05/29/14 1052  COLORURINE YELLOW  LABSPEC 1.016  PHURINE 5.5  GLUCOSEU 100*  HGBUR LARGE*  BILIRUBINUR NEGATIVE  KETONESUR NEGATIVE  PROTEINUR 100*  UROBILINOGEN 1.0  NITRITE NEGATIVE  LEUKOCYTESUR LARGE*       Component Value Date/Time   CHOL 92 07/19/2014 1006   TRIG 165* 07/19/2014 1006   HDL 11* 07/19/2014 1006   CHOLHDL 8.4 07/19/2014 1006   VLDL 33 07/19/2014 1006   LDLCALC 48 07/19/2014 1006   Lab Results  Component Value Date   HGBA1C 10.4* 05/29/2014   No results found for: LABOPIA, COCAINSCRNUR, LABBENZ, AMPHETMU, THCU, LABBARB  No results for input(s):  ETH in the last 168 hours.  Ct Angio Head W/cm &/or Wo Cm  05/30/2014   CLINICAL DATA:  Aphasia and weakness.  Code stroke.  EXAM: CT ANGIOGRAPHY HEAD AND NECK  TECHNIQUE: Multidetector CT imaging of the head and neck was performed using the standard protocol during bolus administration of intravenous contrast. Multiplanar CT image reconstructions and MIPs were obtained to evaluate the vascular anatomy. Carotid stenosis measurements (when applicable) are obtained utilizing NASCET criteria, using the distal internal carotid diameter as the denominator.  CONTRAST:  168mL OMNIPAQUE IOHEXOL 350 MG/ML SOLN  COMPARISON:  None.  FINDINGS: CT HEAD  Brain: There is mild generalized cerebral atrophy, within normal limits  for age. There is no evidence of acute cortical infarct, intracranial hemorrhage, mass, midline shift, or extra-axial fluid collection. Periventricular white-matter hypodensities are nonspecific but compatible with mild chronic small vessel ischemic disease. Small foci of well defined low density in the parietal lobes may reflect dilated perivascular spaces or less likely chronic lacunar infarcts.  Calvarium and skull base: No skull fracture or aggressive osseous lesions identified.  Paranasal sinuses: Visualized paranasal sinuses and mastoid air cells are clear.  Orbits: Prior bilateral cataract extraction.  CTA NECK  Aortic arch: 3 vessel aortic arch with extensive atherosclerotic calcification. There is mild stenosis of the proximal brachiocephalic artery. There is heavy calcification throughout the proximal left subclavian artery with mild to moderate stenosis.  Right carotid system: Proximal common carotid artery is partially obscured by streak artifact. There is moderate calcification at the carotid bifurcation without significant common or internal carotid artery stenosis identified. There is mild narrowing of the proximal ECA.  Left carotid system: There is approximately 50% stenosis of the common carotid artery at its origin. There is moderate calcification in the mid to distal common carotid artery extending into the proximal ICA. No significant distal common carotid or internal carotid artery stenosis is present.  Vertebral arteries: Vertebral arteries are patent with the left being mildly dominant. No vertebral artery stenosis is identified.  Skeleton: Sequelae of prior median sternotomy are partially visualized. Advanced multilevel cervical disc degeneration is noted, worst at C3-4 where there is likely moderate spinal stenosis and severe bilateral neural foraminal stenosis.  Other neck: An 8 mm right thyroid nodule is incidentally noted. Biapical lung scarring is noted. Left submandibular gland appears  to be absent.  CTA HEAD  Anterior circulation: Internal carotid arteries are patent from skullbase to carotid termini. There is moderate carotid siphon calcification bilaterally resulting in mild bilateral cavernous and supraclinoid carotid stenosis, estimated at approximately 50%. 1.5-2 mm outpouching from the distal right supraclinoid ICA in the posterior communicating artery region likely represents an infundibulum. ACAs and MCAs are unremarkable.  Posterior circulation: Intracranial vertebral arteries are patent to the basilar with the left being dominant. There is mild bilateral vertebral artery calcification without significant stenosis. Right PICA origin is patent. Left PICA origin is not clearly identified, although it may share a common origin with the left AICA. Bilateral AICA and SCA origins are patent. Basilar artery is patent without stenosis. PCAs are unremarkable.  Venous sinuses: Patent.  Anatomic variants: None.  Delayed phase: Not performed at request of attending neurologist.  IMPRESSION: 1. No evidence of acute intracranial abnormality. 2. Mild chronic small vessel ischemic disease. 3. No evidence of major intracranial arterial occlusion. Prominent intracranial ICA calcification with mild stenosis bilaterally. 4. Extensive atherosclerotic calcification in the aortic arch with 50% stenosis of the proximal left common carotid artery. 5. Moderate bilateral carotid  bifurcation atherosclerosis without significant cervical ICA stenosis. 6. Pain vertebral arteries without stenosis.  Preliminary results were called by telephone at the time of interpretation on 05/30/2014 at 11:25 am to Dr. Leonel Ramsay, who verbally acknowledged these results.   Electronically Signed   By: Logan Bores   On: 05/30/2014 12:12   Dg Ankle Complete Right  05/29/2014   CLINICAL DATA:  Fall in bathroom with right ankle pain. Initial encounter  EXAM: RIGHT ANKLE - COMPLETE 3+ VIEW  COMPARISON:  None.  FINDINGS: Nondisplaced  oblique fracture of the distal fibula, above the ankle joint. There is no medial malleolus fracture or ankle mortise widening.  Osteopenia.  IMPRESSION: Nondisplaced distal fibula fracture.   Electronically Signed   By: Monte Fantasia M.D.   On: 05/29/2014 11:52   Ct Angio Neck W/cm &/or Wo/cm  05/30/2014   CLINICAL DATA:  Aphasia and weakness.  Code stroke.  EXAM: CT ANGIOGRAPHY HEAD AND NECK  TECHNIQUE: Multidetector CT imaging of the head and neck was performed using the standard protocol during bolus administration of intravenous contrast. Multiplanar CT image reconstructions and MIPs were obtained to evaluate the vascular anatomy. Carotid stenosis measurements (when applicable) are obtained utilizing NASCET criteria, using the distal internal carotid diameter as the denominator.  CONTRAST:  158mL OMNIPAQUE IOHEXOL 350 MG/ML SOLN  COMPARISON:  None.  FINDINGS: CT HEAD  Brain: There is mild generalized cerebral atrophy, within normal limits for age. There is no evidence of acute cortical infarct, intracranial hemorrhage, mass, midline shift, or extra-axial fluid collection. Periventricular white-matter hypodensities are nonspecific but compatible with mild chronic small vessel ischemic disease. Small foci of well defined low density in the parietal lobes may reflect dilated perivascular spaces or less likely chronic lacunar infarcts.  Calvarium and skull base: No skull fracture or aggressive osseous lesions identified.  Paranasal sinuses: Visualized paranasal sinuses and mastoid air cells are clear.  Orbits: Prior bilateral cataract extraction.  CTA NECK  Aortic arch: 3 vessel aortic arch with extensive atherosclerotic calcification. There is mild stenosis of the proximal brachiocephalic artery. There is heavy calcification throughout the proximal left subclavian artery with mild to moderate stenosis.  Right carotid system: Proximal common carotid artery is partially obscured by streak artifact. There is  moderate calcification at the carotid bifurcation without significant common or internal carotid artery stenosis identified. There is mild narrowing of the proximal ECA.  Left carotid system: There is approximately 50% stenosis of the common carotid artery at its origin. There is moderate calcification in the mid to distal common carotid artery extending into the proximal ICA. No significant distal common carotid or internal carotid artery stenosis is present.  Vertebral arteries: Vertebral arteries are patent with the left being mildly dominant. No vertebral artery stenosis is identified.  Skeleton: Sequelae of prior median sternotomy are partially visualized. Advanced multilevel cervical disc degeneration is noted, worst at C3-4 where there is likely moderate spinal stenosis and severe bilateral neural foraminal stenosis.  Other neck: An 8 mm right thyroid nodule is incidentally noted. Biapical lung scarring is noted. Left submandibular gland appears to be absent.  CTA HEAD  Anterior circulation: Internal carotid arteries are patent from skullbase to carotid termini. There is moderate carotid siphon calcification bilaterally resulting in mild bilateral cavernous and supraclinoid carotid stenosis, estimated at approximately 50%. 1.5-2 mm outpouching from the distal right supraclinoid ICA in the posterior communicating artery region likely represents an infundibulum. ACAs and MCAs are unremarkable.  Posterior circulation: Intracranial vertebral arteries are patent to  the basilar with the left being dominant. There is mild bilateral vertebral artery calcification without significant stenosis. Right PICA origin is patent. Left PICA origin is not clearly identified, although it may share a common origin with the left AICA. Bilateral AICA and SCA origins are patent. Basilar artery is patent without stenosis. PCAs are unremarkable.  Venous sinuses: Patent.  Anatomic variants: None.  Delayed phase: Not performed at  request of attending neurologist.  IMPRESSION: 1. No evidence of acute intracranial abnormality. 2. Mild chronic small vessel ischemic disease. 3. No evidence of major intracranial arterial occlusion. Prominent intracranial ICA calcification with mild stenosis bilaterally. 4. Extensive atherosclerotic calcification in the aortic arch with 50% stenosis of the proximal left common carotid artery. 5. Moderate bilateral carotid bifurcation atherosclerosis without significant cervical ICA stenosis. 6. Pain vertebral arteries without stenosis.  Preliminary results were called by telephone at the time of interpretation on 05/30/2014 at 11:25 am to Dr. Leonel Ramsay, who verbally acknowledged these results.   Electronically Signed   By: Logan Bores   On: 05/30/2014 12:12   Mr Brain Wo Contrast  05/30/2014   CLINICAL DATA:  Status post fall with ankle fracture, altered mental status while in the hospital, difficulty speaking. History of atrial fibrillation, status post ablation. History of diabetes, hypertension, hyperlipidemia, carotid artery occlusion.  EXAM: MRI HEAD WITHOUT CONTRAST  MRA HEAD WITHOUT CONTRAST  TECHNIQUE: Multiplanar, multiecho pulse sequences of the brain and surrounding structures were obtained without intravenous contrast. Angiographic images of the head were obtained using MRA technique without contrast.  COMPARISON:  CT of the head May 30, 2014 at 11:08 a.m.  FINDINGS: MRI HEAD FINDINGS  Mildly motion degraded examination.  3.9 x 1.9 cm area reduced diffusion within LEFT frontal lobe extending to the cortex, corresponding low ADC values. Very minimal T2 hyperintense signal. No susceptibility artifact to suggest hemorrhage.  Ventricles and sulci are overall normal for patient's age. No midline shift, mass effect or mass lesions. Mild white matter changes can be seen with chronic small vessel ischemic disease, less than expected for age.  No abnormal extra-axial fluid collections. Status post  bilateral ocular lens implants. Paranasal sinuses and mastoid air cells appear well-aerated. Patient is edentulous. No abnormal sellar expansion. Cerebellar tonsils at but not below the foramen magnum. No suspicious calvarial bone marrow signal.  MRA HEAD FINDINGS  Moderately motion degraded examination.  Anterior circulation: Normal flow related enhancement of the included cervical, petrous, cavernous and supra clinoid internal carotid arteries. Patent anterior communicating artery. Normal flow related enhancement of the anterior and middle cerebral arteries, limited assessment of distal segments due to motion.  No large vessel occlusion, central high-grade stenosis.  Posterior circulation: LEFT vertebral artery is dominant. Basilar artery is patent, with normal flow related enhancement of the main branch vessels. Normal flow related enhancement of the posterior cerebral arteries.  No large vessel occlusion, central high-grade stenosis.  IMPRESSION: MRI HEAD: Acute small LEFT middle cerebral artery territory infarct (LEFT frontal lobe).  Otherwise normal MRI of the brain for age.  MRA HEAD: Moderately motion degraded examination without central large vessel occlusion. Distal thromboembolic disease would not be appreciated on this motion degraded examination.  These results will be called to the ordering clinician or representative by the Radiologist Assistant, and communication documented in the PACS or zVision Dashboard.   Electronically Signed   By: Elon Alas   On: 05/30/2014 22:52   Mr Jodene Nam Headm  05/30/2014   CLINICAL DATA:  Status post fall with  ankle fracture, altered mental status while in the hospital, difficulty speaking. History of atrial fibrillation, status post ablation. History of diabetes, hypertension, hyperlipidemia, carotid artery occlusion.  EXAM: MRI HEAD WITHOUT CONTRAST  MRA HEAD WITHOUT CONTRAST  TECHNIQUE: Multiplanar, multiecho pulse sequences of the brain and surrounding  structures were obtained without intravenous contrast. Angiographic images of the head were obtained using MRA technique without contrast.  COMPARISON:  CT of the head May 30, 2014 at 11:08 a.m.  FINDINGS: MRI HEAD FINDINGS  Mildly motion degraded examination.  3.9 x 1.9 cm area reduced diffusion within LEFT frontal lobe extending to the cortex, corresponding low ADC values. Very minimal T2 hyperintense signal. No susceptibility artifact to suggest hemorrhage.  Ventricles and sulci are overall normal for patient's age. No midline shift, mass effect or mass lesions. Mild white matter changes can be seen with chronic small vessel ischemic disease, less than expected for age.  No abnormal extra-axial fluid collections. Status post bilateral ocular lens implants. Paranasal sinuses and mastoid air cells appear well-aerated. Patient is edentulous. No abnormal sellar expansion. Cerebellar tonsils at but not below the foramen magnum. No suspicious calvarial bone marrow signal.  MRA HEAD FINDINGS  Moderately motion degraded examination.  Anterior circulation: Normal flow related enhancement of the included cervical, petrous, cavernous and supra clinoid internal carotid arteries. Patent anterior communicating artery. Normal flow related enhancement of the anterior and middle cerebral arteries, limited assessment of distal segments due to motion.  No large vessel occlusion, central high-grade stenosis.  Posterior circulation: LEFT vertebral artery is dominant. Basilar artery is patent, with normal flow related enhancement of the main branch vessels. Normal flow related enhancement of the posterior cerebral arteries.  No large vessel occlusion, central high-grade stenosis.  IMPRESSION: MRI HEAD: Acute small LEFT middle cerebral artery territory infarct (LEFT frontal lobe).  Otherwise normal MRI of the brain for age.  MRA HEAD: Moderately motion degraded examination without central large vessel occlusion. Distal  thromboembolic disease would not be appreciated on this motion degraded examination.  These results will be called to the ordering clinician or representative by the Radiologist Assistant, and communication documented in the PACS or zVision Dashboard.   Electronically Signed   By: Elon Alas   On: 05/30/2014 22:52   Dg Hip Unilat With Pelvis 2-3 Views Right  05/29/2014   CLINICAL DATA:  Fall in bathroom with right hip and ankle pain. Initial encounter  EXAM: RIGHT HIP (WITH PELVIS) 2-3 VIEWS  COMPARISON:  None.  FINDINGS: When accounting for marginal spurring, there is no evidence of femoral neck or other hip fracture. The hips are located. There is mild degenerative spurring about both hips without asymmetric joint narrowing. No evidence of pelvic ring fracture or diastasis.  IMPRESSION: No acute osseous findings.   Electronically Signed   By: Monte Fantasia M.D.   On: 05/29/2014 11:54   Carotid Doppler  There is 1-39% bilateral ICA stenosis. Vertebral artery flow is antegrade.     PHYSICAL EXAM Obese middle aged Caucasian lady not in distress. . Afebrile. Head is nontraumatic. Neck is supple without bruit.    Cardiac exam no murmur or gallop. Lungs are clear to auscultation. Distal pulses are well felt except in RLE where bandage for recent fracture limits evaluation. Neurological Exam : Awake alert oriented 2. Moderate expressive aphasia able to speak her name and a few words but not sentences. Mild dysarthria. Good comprehension and naming. Impaired repetition slightly. Extraocular movements are full range without nystagmus. Blinks to threat  bilaterally. Fundi were not visualized. Vision acuity seems adequate. Mild right lower facial weakness. Tongue midline. Motor system exam revealed no upper or lower extremity drift. Mild diminished fine finger movements on the right. Orbits left over right upper extremity. Normal sensation. Deep tendon reflexes are symmetric. Plantars are downgoing. Gait  was not tested. Right lower extremity strength testing is limited due to ankle fracture and bandage NIHSS 5 ASSESSMENT/PLAN Ms. Chara Biby is a 79 y.o. female with history of poorly controlled diabetes, coronary artery disease status post CABG, atrial fibrillation not on anticoagulation due to history of bleeding, history a few falls presenting to [facility] following a fall with aphasia. She did not receive IV t-PA due to fall.   Stroke:  Small left MCA frontal lobe infarct secondary to atrial fibrillation  Resultant  Expressive aphasia  CTA head and neck No acute intracranial abnormality, there was 50% stenosis in aortic arch  MRI  Small L MCA frontal lobe infarct  MRA  No large vessel stenosis  Carotid Doppler  No significant stenosis   2D Echo  pending   Heparin 5000 units sq tid for VTE prophylaxis  Diet heart healthy/carb modified thin liquids  aspirin 81 mg orally every day prior to admission, now on aspirin 81 mg orally every day  Ongoing aggressive stroke risk factor management  Therapy recommendations:  pending   Disposition:  pending   Atrial Fibrillation  Not on anticoagulation d/t hx of bleeding and falls  Hypertension  Stable  Hyperlipidemia  Home meds:  lipitor 80 resumed in hospital  LDL 48, goal < 70  Continue statin at discharge  Diabetes type 2   HgbA1c 10.4, goal < 7.0  Uncontrolled  Other Stroke Risk Factors  Advanced age  Former Cigarette smoker, quit smoking in 1992  Obesity, Body mass index is 31.81 kg/(m^2).   Coronary artery disease - s/p CABG w/ troponin elevation (non ischemic)  PAD  PVD  Other Active Problems  Mild rhabdomyolysis  Distal R fibular fracture  UTI, placed on Rocphin  Sepsis secondary to UTI with elevated Temperature  ARF  Anemia of chronic disease  Hospital day # 2  I have personally examined this patient, reviewed notes, independently viewed imaging studies, participated in medical  decision making and plan of care. I have made any additions or clarifications directly to the above note. She clearly has had an embolic stroke likely related to atrial fibrillation and remains at high risk for recurrent strokes and TIAs. Agree with anticoagulation with eliquis to reduce risk for future strokes. Continue ongoing stroke evaluation.  Antony Contras, MD Medical Director [facility] Pager: [phone] 09-26-2014 1:27 PM     To contact Stroke Continuity provider, please refer to [email or web address] After hours, contact General Neurology

## 2014-05-31 NOTE — Progress Notes (Signed)
Subjective:  Still aphasic, but speaking better. Not SOB.  No chest pain.  Objective:  Vital Signs in the last 24 hours: BP 131/46 mmHg  Pulse 90  Temp(Src) 98.6 F (37 C) (Oral)  Resp 20  Ht 5\' 6"  (1.676 m)  Wt 89.359 kg (197 lb)  BMI 31.81 kg/m2  SpO2 96%  Physical Exam: Pleasant WF in NAD Lungs:  Clear  Cardiac:  irregular rhythm, normal S1 and S2, no S3, 2/6 murmur Extremities: Mild swelling of right leg  Intake/Output from previous day: 02/11 0701 - 02/12 0700 In: -  Out: 2 [Urine:2] Weight Filed Weights   05/29/14 1500  Weight: 89.359 kg (197 lb)    Lab Results: Basic Metabolic Panel:  Recent Labs  05/29/14 1110 05/30/14 0254  NA 130* 131*  K 3.5 3.6  CL 97 101  CO2 24 22  GLUCOSE 350* 252*  BUN 38* 44*  CREATININE 0.89 1.42*    CBC:  Recent Labs  05/29/14 1110 05/30/14 0254  WBC 16.4* 13.2*  NEUTROABS 13.8*  --   HGB 13.6 11.5*  HCT 38.8 34.1*  MCV 91.9 93.7  PLT 254 236   Telemetry: Atrial fibrillation  Assessment/Plan:  1. 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 CAD stable 3. Low level troponin elevation not ischemic 4. Paroxysmal atrial fibrillation 5. Acute renal insufficiency will need to watch  Rec:  Await ECHO.  Treat UTI, stroke rehab. Would benefit from another trial of anticoagulation in light of CVA.  Would likely use Eliquis instead of warfarin in light of prior GI bleed on supratherapeutic warfain. Await timing of initiation on neuro recommendations.  Watch renal function      W. Doristine Church  MD [facility]  2020/01/315, 8:57 AM

## 2014-05-31 NOTE — Progress Notes (Addendum)
Inpatient Diabetes Program Recommendations  AACE/ADA: New Consensus Statement on Inpatient Glycemic Control (2013)  Target Ranges:  Prepandial:   less than 140 mg/dL      Peak postprandial:   less than 180 mg/dL (1-2 hours)      Critically ill patients:  140 - 180 mg/dL   Reason for Visit: Hyperglycemia  Results for MERL, ANCELET (MRN [id number]) as of 04/22/2014 13:02  Ref. Range 05/30/2014 23:06 04/22/2014 06:59 04/22/2014 11:52  Glucose-Capillary Latest Range: 70-99 mg/dL 257 (H) 199 (H) 222 (H)   Results for ZANAIYAH, PODESTA (MRN [id number]) as of 04/22/2014 13:02  Ref. Range 05/29/2014 15:48  Hemoglobin A1C Latest Range: 4.8-5.6 % 10.4 (H)   Increase NPH to 32 units QAM and 32 units QHS.   Thank you. Lorenda Peck, RD, LDN, CDE Inpatient Diabetes Coordinator [phone]

## 2014-05-31 NOTE — Discharge Instructions (Signed)
NON WEIGHT BEARING RIGHT LOWER EXTREMITY UNTIL FURTHER DIRECTED  Information on my medicine - ELIQUIS (apixaban)  This medication education was reviewed with me or my healthcare representative as part of my discharge preparation.  Why was Eliquis prescribed for you? Eliquis was prescribed for you to reduce the risk of a blood clot forming that can cause a stroke if you have a medical condition called atrial fibrillation (a type of irregular heartbeat).  What do You need to know about Eliquis ? Take your Eliquis TWICE DAILY - one tablet in the morning and one tablet in the evening with or without food. If you have difficulty swallowing the tablet whole please discuss with your pharmacist how to take the medication safely.  Take Eliquis exactly as prescribed by your doctor and DO NOT stop taking Eliquis without talking to the doctor who prescribed the medication.  Stopping may increase your risk of developing a stroke.  Refill your prescription before you run out.  After discharge, you should have regular check-up appointments with your healthcare provider that is prescribing your Eliquis.  In the future your dose may need to be changed if your kidney function or weight changes by a significant amount or as you get older.  What do you do if you miss a dose? If you miss a dose, take it as soon as you remember on the same day and resume taking twice daily.  Do not take more than one dose of ELIQUIS at the same time to make up a missed dose.  Important Safety Information A possible side effect of Eliquis is bleeding. You should call your healthcare provider right away if you experience any of the following: ? Bleeding from an injury or your nose that does not stop. ? Unusual colored urine (red or dark brown) or unusual colored stools (red or black). ? Unusual bruising for unknown reasons. ? A serious fall or if you hit your head (even if there is no bleeding).  Some medicines may  interact with Eliquis and might increase your risk of bleeding or clotting while on Eliquis. To help avoid this, consult your healthcare provider or pharmacist prior to using any new prescription or non-prescription medications, including herbals, vitamins, non-steroidal anti-inflammatory drugs (NSAIDs) and supplements.  This website has more information on Eliquis (apixaban): http://www.eliquis.com/eliquis/home

## 2014-05-31 NOTE — Evaluation (Signed)
Speech Language Pathology Evaluation Patient Details Name: Kristina Summers MRN: [id number] DOB: 10-16-1935 Today's Date: 06-23-2014 Time: [id number] SLP Time Calculation (min) (ACUTE ONLY): 40 min  Problem List:  Patient Active Problem List   Diagnosis Date Noted  . CVA (cerebral infarction) 003-09-2014  . UTI (lower urinary tract infection) 05/29/2014  . Fracture of distal end of right fibula 05/29/2014  . Rhabdomyolysis 05/29/2014  . Transaminitis 05/29/2014  . Weakness of both lower extremities 01/11/2014  . Pain of left lower extremity- and Right 01/11/2014  . Aftercare following surgery of the circulatory system, [place] 01/11/2014  . Peripheral vascular disease, unspecified 01/11/2014  . Cellulitis of great toe, right 08/23/2013  . CAD (coronary artery disease)   . Hypertensive heart disease without CHF   . Paroxysmal atrial fibrillation   . Diabetes mellitus type 2 with peripheral artery disease   . Obesity (BMI 30-39.9)   . Hyperlipidemia   . Atherosclerosis of native arteries of extremity with intermittent claudication   . History of GI bleed 04/21/2009  . S/P CABG (coronary artery bypass graft) 11/20/1998   Past Medical History:  Past Medical History  Diagnosis Date  . CAD (coronary artery disease)   . Obesity   . Atrial fibrillation   . Hypertension   . Diabetes mellitus   . Hyperlipidemia   . Peripheral vascular disease   . GI bleed 1/11  . Carotid artery occlusion   . Normal nuclear stress test 03/18/2011  . Angina   . GERD (gastroesophageal reflux disease)   . Arthritis   . Fibromyalgia   . Myocardial infarction ~ 1991  . UTI (lower urinary tract infection) Sept. 2015   Past Surgical History:  Past Surgical History  Procedure Laterality Date  . Trigger finger release  1996    right thumb  . Cataract extraction w/ intraocular lens  implant, bilateral  2004-2005  . Coronary artery bypass graft  2000    CABG X5  . Peripheral arterial stent graft  04/01/11   right common iliac  . Angioplasty  06/17/11    Left leg common femoral artery cannulation under u/s Left leg runoff  . Eye surgery    . Abdominal aortagram N/A 04/01/2011    Procedure: ABDOMINAL AORTAGRAM;  Surgeon: Conrad Cedarville, MD;  Location: [facility] CATH LAB;  Service: Cardiovascular;  Laterality: N/A;  . Percutaneous stent intervention Right 04/01/2011    Procedure: PERCUTANEOUS STENT INTERVENTION;  Surgeon: Conrad Sherburne, MD;  Location: [facility] CATH LAB;  Service: Cardiovascular;  Laterality: Right;  rt iliac stent  . Lower extremity angiogram Bilateral 04/01/2011    Procedure: LOWER EXTREMITY ANGIOGRAM;  Surgeon: Conrad Waihee-Waiehu, MD;  Location: [facility] CATH LAB;  Service: Cardiovascular;  Laterality: Bilateral;  . Lower extremity angiogram Left 06/17/2011    Procedure: LOWER EXTREMITY ANGIOGRAM;  Surgeon: Conrad Monroe, MD;  Location: [facility] CATH LAB;  Service: Cardiovascular;  Laterality: Left;  . Lower extremity angiogram N/A 11/18/2011    Procedure: LOWER EXTREMITY ANGIOGRAM;  Surgeon: Conrad Bier, MD;  Location: [facility] CATH LAB;  Service: Cardiovascular;  Laterality: N/A;   HPI:  79 yo female adm to [facility] (transferred to [facility]) after found down and diagnosed with ankle fx.  Pt also found to have left frontal lobe CVA with aphasia.  Speech evaluation ordered.  Pt lives alone per family and has a neighbor check on her.     Assessment / Plan / Recommendation Clinical Impression  Pt presents with moderately severe Broca's aphasia resulting in  nonfluent speech output at single word level - with groping and initial sound repetition, relatively intact comprehension, word retrieval and repetition deficits.  Pt is able to complete automatic speech tasks with Min to Mod I.  Pt did name 4/4 items without difficulties.   She also named her children and stated "thank you very much" at end of session.    Pt is legally blind which also limits use of augmentative communication board for functional use.  Daughter Kristina Summers reports she will  bring mother's glasses for use during therapy.  Also ? right neglect or inattention impacting function as pt prefers to look to left and required mod cues to transit gaze to right.  She will benefit from SLP to maximize functional expressive language.    SLP educated pt/son/daughter to results of test, goal setting and provided compensation strategies to help maximize pt's verbal output.      Recommend consider CIR vs SNF dependent on assistance that may be provided after dc.        SLP Assessment  Patient needs continued Speech Lanaguage Pathology Services    Follow Up Recommendations  Inpatient Rehab;Skilled Nursing facility (vs)    Frequency and Duration min 2x/week  2 weeks   Pertinent Vitals/Pain Pain Assessment: Faces Pain Score: 0-No pain   SLP Goals  Patient/Family Stated Goal: none stated Potential to Achieve Goals (ACUTE ONLY): Fair  SLP Evaluation Prior Functioning  Cognitive/Linguistic Baseline: Within functional limits  Lives With: Alone   Cognition  Overall Cognitive Status: Within Functional Limits for tasks assessed Arousal/Alertness: Awake/alert Orientation Level: Oriented to person Attention: Sustained;Selective Sustained Attention: Appears intact Selective Attention: Appears intact Awareness: Appears intact (pt demonstrates frustration with expressive language deficits) Problem Solving: Appears intact Safety/Judgment: Appears intact    Comprehension  Auditory Comprehension Overall Auditory Comprehension: Appears within functional limits for tasks assessed Yes/No Questions: Impaired Basic Biographical Questions:  (100%) Basic Immediate Environment Questions:  (100%) Complex Questions: 75-100% accurate Commands: Within Functional Limits (for one step commands during testing) Conversation: Simple Interfering Components: Visual impairments Visual Recognition/Discrimination Discrimination: Not tested Reading Comprehension Reading Status:  (vision  impairments, pt does not have glasses)    Expression Expression Primary Mode of Expression: Verbal Verbal Expression Overall Verbal Expression: Impaired Initiation: Impaired Automatic Speech: Counting (min assist for counting) Level of Generative/Spontaneous Verbalization: Word Repetition: Impaired Level of Impairment: Word level Naming: No impairment (for 4 items) Pragmatics: No impairment Effective Techniques: Articulatory cues;Sentence completion;Melodic intonation Other Verbal Expression Comments: attempting communication board but pt "legally blind" per family Written Expression Dominant Hand: Right Written Expression: Not tested   Oral / Motor Oral Motor/Sensory Function Lingual Strength: Within Functional Limits Facial ROM: Within Functional Limits Facial Symmetry: Right drooping eyelid Facial Sensation: Reduced Velum: Within Functional Limits Mandible: Within Functional Limits Motor Speech Overall Motor Speech: Impaired Respiration: Within functional limits Phonation: Normal Resonance: Within functional limits Articulation: Within functional limitis (except motor planning deficits) Intelligibility: Intelligible Motor Planning: Impaired Motor Speech Errors: Aware;Groping for words;Inconsistent   Summers     Luanna Salk, [place] [facility] [place] [phone]

## 2014-05-31 NOTE — Evaluation (Signed)
Physical Therapy Evaluation Patient Details Name: Modine Luis MRN: [id number] DOB: [birth year] Today's Date: Jun 29, 202016   History of Present Illness  79 yo female with onset of  fall with R non dislpaced fibular fracture, and R hemiplegia, UTI with verbal difficulties.  PMHx:  osteopenia, CAD, obesity, a-fib, HTN, DM, HLD, PVD, GI bleed, angina, GERD, fibromyalgia, MI, UTI  Clinical Impression  Pt was assessed for initial mobility and to get up to stand with attempted gait.  Pt is very capable of shifting up the bed with assist to keep NWB on RLE and was then able to scoot up bed with the same.  PT asked nursing to obtain a drop arm chair from another unit, as PT has not been able to do so.  CIR planned for follow up    Follow Up Recommendations CIR;Supervision/Assistance - 24 hour    Equipment Recommendations  None recommended by PT (until after inpt therapy)    Recommendations for Other Services Rehab consult     Precautions / Restrictions Precautions Precautions: Fall Required Braces or Orthoses: Other Brace/Splint (R ankle splint with ace wrap) Restrictions Weight Bearing Restrictions: Yes RLE Weight Bearing: Non weight bearing      Mobility  Bed Mobility Overal bed mobility: Needs Assistance Bed Mobility: Supine to Sit     Supine to sit: Min assist;Mod assist     General bed mobility comments: mod to first sit up then pt using UE's and scooting with hips more  Transfers Overall transfer level: Needs assistance Equipment used: Rolling walker (2 wheeled);2 person hand held assist Transfers: Sit to/from Stand Sit to Stand: Mod assist;+2 physical assistance;+2 safety/equipment         General transfer comment: Pt could stand for 2 minutes but could not control RLE wbing enough to manage to hop a step  Ambulation/Gait             General Gait Details: unable to step  Stairs            Wheelchair Mobility    Modified Rankin (Stroke Patients  Only) Modified Rankin (Stroke Patients Only) Pre-Morbid Rankin Score: Slight disability Modified Rankin: Moderately severe disability     Balance Overall balance assessment: Needs assistance Sitting-balance support: Bilateral upper extremity supported Sitting balance-Leahy Scale: Fair Sitting balance - Comments: static fair and dynamic fair- Postural control: Posterior lean;Left lateral lean Standing balance support: Bilateral upper extremity supported Standing balance-Leahy Scale: Poor Standing balance comment: assisted to stand bedside but did not get OOB  to chair due to lack of drop arm chair                             Pertinent Vitals/Pain Pain Assessment: Faces Pain Score: 4  Faces Pain Scale: Hurts little more Pain Location: Rankle Pain Descriptors / Indicators:  (unable to report) Pain Intervention(s): Limited activity within patient's tolerance;Monitored during session;Premedicated before session;Repositioned    Home Living Family/patient expects to be discharged to:: Private residence Living Arrangements: Alone Available Help at Discharge: Family;Available PRN/intermittently Type of Home: Apartment Home Access: Stairs to enter Entrance Stairs-Rails: None Entrance Stairs-Number of Steps: 1 and 1 Home Layout: One level Home Equipment: Walker - 2 wheels      Prior Function Level of Independence: Independent with assistive device(s)         Comments: Pt ambulates with RW but reports independence with ADLs.      Hand Dominance   Dominant Hand: Right  Extremity/Trunk Assessment   Upper Extremity Assessment: Overall WFL for tasks assessed           Lower Extremity Assessment: RLE deficits/detail RLE Deficits / Details: NWB due to fibular fracture and demonstrates 2+ hip and knee strength    Cervical / Trunk Assessment: Normal  Communication   Communication: Expressive difficulties  Cognition Arousal/Alertness: Awake/alert Behavior  During Therapy: WFL for tasks assessed/performed Overall Cognitive Status: Within Functional Limits for tasks assessed                      General Comments General comments (skin integrity, edema, etc.): Pt is having a limited tolerance for standing control of RLE and needs assistance to maintain but can handle with help under her R foot.    Exercises        Assessment/Plan    PT Assessment Patient needs continued PT services  PT Diagnosis Hemiplegia dominant side;Difficulty walking;Acute pain   PT Problem List Decreased strength;Decreased range of motion;Decreased activity tolerance;Decreased balance;Decreased mobility;Decreased coordination;Obesity;Pain  PT Treatment Interventions DME instruction;Gait training;Functional mobility training;Therapeutic activities;Therapeutic exercise;Balance training;Neuromuscular re-education;Patient/family education   PT Goals (Current goals can be found in the Care Plan section) Acute Rehab PT Goals Patient Stated Goal: did not state PT Goal Formulation: With patient Time For Goal Achievement: 06/14/14 Potential to Achieve Goals: Good    Frequency Min 4X/week   Barriers to discharge Inaccessible home environment;Decreased caregiver support      Co-evaluation               End of Session Equipment Utilized During Treatment: Gait belt Activity Tolerance: Patient tolerated treatment well Patient left: in bed;with call bell/phone within reach;with bed alarm set Nurse Communication: Mobility status;Other (comment) (asked for drop arm chair)         Time: [id number] PT Time Calculation (min) (ACUTE ONLY): 35 min   Charges:   PT Evaluation $Initial PT Evaluation Tier I: 1 Procedure PT Treatments $Therapeutic Activity: 8-22 mins   PT G Codes:        Ramond Dial June 04, 2014, 1:22 PM   Mee Hives, PT MS Acute Rehab Dept. Number: [id number]

## 2014-06-01 DIAGNOSIS — I2510 Atherosclerotic heart disease of native coronary artery without angina pectoris: Secondary | ICD-10-CM

## 2014-06-01 DIAGNOSIS — E785 Hyperlipidemia, unspecified: Secondary | ICD-10-CM

## 2014-06-01 LAB — BASIC METABOLIC PANEL
Anion gap: 6 (ref 5–15)
BUN: 15 mg/dL (ref 6–23)
CHLORIDE: 103 mmol/L (ref 96–112)
CO2: 26 mmol/L (ref 19–32)
Calcium: 8.1 mg/dL — ABNORMAL LOW (ref 8.4–10.5)
Creatinine, Ser: 0.72 mg/dL (ref 0.50–1.10)
GFR calc non Af Amer: 81 mL/min — ABNORMAL LOW (ref >90)
Glucose, Bld: 107 mg/dL — ABNORMAL HIGH (ref 70–99)
Potassium: 3.5 mmol/L (ref 3.5–5.1)
SODIUM: 135 mmol/L (ref 135–145)

## 2014-06-01 LAB — GLUCOSE, CAPILLARY
Glucose-Capillary: 134 mg/dL — ABNORMAL HIGH (ref 70–99)
Glucose-Capillary: 225 mg/dL — ABNORMAL HIGH (ref 70–99)
Glucose-Capillary: 161 mg/dL — ABNORMAL HIGH (ref 70–99)
Glucose-Capillary: 164 mg/dL — ABNORMAL HIGH (ref 70–99)

## 2014-06-01 LAB — CBC
HEMATOCRIT: 35.9 % — AB (ref 36.0–46.0)
Hemoglobin: 12 g/dL (ref 12.0–15.0)
MCH: 31.5 pg (ref 26.0–34.0)
MCHC: 33.4 g/dL (ref 30.0–36.0)
MCV: 94.2 fL (ref 78.0–100.0)
PLATELETS: 299 10*3/uL (ref 150–400)
RBC: 3.81 MIL/uL — ABNORMAL LOW (ref 3.87–5.11)
RDW: 13.4 % (ref 11.5–15.5)
WBC: 10.1 10*3/uL (ref 4.0–10.5)

## 2014-06-01 MED ORDER — BISACODYL 10 MG RE SUPP: 10.0000 mg | Freq: Every day | RECTAL | Status: DC | PRN

## 2014-06-01 MED ORDER — POLYETHYLENE GLYCOL 3350 17 G PO PACK
17.0000 g | PACK | Freq: Every day | ORAL | Status: DC
  Administered 2014-06-01 – 2014-06-03 (×3): 17 g via ORAL
  Filled 2014-06-01 (×3): qty 1

## 2014-06-01 MED ORDER — INSULIN NPH (HUMAN) (ISOPHANE) 100 UNIT/ML ~~LOC~~ SUSP
38.0000 [IU] | Freq: Two times a day (BID) | SUBCUTANEOUS | Status: DC
  Administered 2014-06-01 – 2014-06-03 (×5): 38 [IU] via SUBCUTANEOUS

## 2014-06-01 MED ORDER — BISACODYL 10 MG RE SUPP
10.0000 mg | Freq: Once | RECTAL | Status: AC
  Administered 2014-06-01: 10 mg via RECTAL
  Filled 2014-06-01: qty 1

## 2014-06-01 MED ORDER — POLYETHYLENE GLYCOL 3350 17 G PO PACK: 17.0000 g | PACK | Freq: Every day | ORAL | Status: DC

## 2014-06-01 MED ORDER — DOCUSATE SODIUM 100 MG PO CAPS: 100.0000 mg | ORAL_CAPSULE | Freq: Two times a day (BID) | ORAL | Status: DC

## 2014-06-01 NOTE — Progress Notes (Signed)
Subjective:  Still aphasic but speech is better. No shortness of breath or chest pain.  Objective:  Vital Signs in the last 24 hours: BP 137/57 mmHg  Pulse 74  Temp(Src) 98.3 F (36.8 C) (Oral)  Resp 20  Ht 5\' 6"  (1.676 m)  Wt 89.359 kg (197 lb)  BMI 31.81 kg/m2  SpO2 94%  Physical Exam: Pleasant WF in NAD Lungs:  Clear  Cardiac:  irregular rhythm, normal S1 and S2, no S3, 2/6 murmur Extremities: Mild swelling of right leg  Intake/Output from previous day: 02/12 0701 - 02/13 0700 In: -  Out: 1 [Urine:1] Weight Filed Weights   05/29/14 1500  Weight: 89.359 kg (197 lb)    Lab Results: Basic Metabolic Panel:  Recent Labs  05/31/14 1006 06/01/14 0455  NA 132* 135  K 3.6 3.5  CL 103 103  CO2 23 26  GLUCOSE 298* 107*  BUN 22 15  CREATININE 0.84 0.72    CBC:  Recent Labs  05/31/14 1006 06/01/14 0455  WBC 10.8* 10.1  HGB 11.9* 12.0  HCT 34.7* 35.9*  MCV 93.3 94.2  PLT 279 299   Telemetry: Atrial fibrillation  Assessment/Plan:  1. 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 CAD stable 3. Low level troponin elevation not ischemic 4. Paroxysmal atrial fibrillation 5. Acute renal insufficiency will need to watch  Rec:  Echo not back yet. We will check results there. She has been started on Eliquis which I think is reasonable. We'll need to keep her off of aspirin watch carefully for GI bleeding which was the reason her anticoagulation was stopped previously.      Kerry Hough  MD [facility]  06/01/2014, 12:06 PM

## 2014-06-01 NOTE — Progress Notes (Signed)
STROKE TEAM PROGRESS NOTE   HISTORY Kristina Summers is a 79 y.o. female with a history of afib s/p ablation procedures no longer on anti-coagulation who presented following a fall with ankle fracture. This is being managed conservatively.   She was "spacey" when the nurse spoke with her this morning and seemed to have some trouble finding her words, but was able to respond. She states that she was having some difficulty speaking even the first time that she spoke.   When her daughter saw her around 8am, she noticed she was off, but thought it was pain medication. She subsequently saw her again around 9:45 am and she was having difficulty speaking.   NIHSS: 6(2 for questions, 1 for gaze preference, 2 for aphasia, 1 for dysarthria)  She was LKW: 2/10 prior to bed.   Patient was not administered TPA secondary to delay in arrival. She was admitted for further evaluation and treatment.   SUBJECTIVE (INTERVAL HISTORY) Patient stable, no overnight events   OBJECTIVE Temp:  [97.6 F (36.4 C)-98.4 F (36.9 C)] 97.6 F (36.4 C) (02/13 1318) Pulse Rate:  [67-87] 67 (02/13 1318) Cardiac Rhythm:  [-] Atrial fibrillation (02/13 0710) Resp:  [20] 20 (02/13 1318) BP: (117-153)/(45-68) 129/55 mmHg (02/13 1318) SpO2:  [94 %-98 %] 97 % (02/13 1318)   Recent Labs Lab 05/31/14 1152 05/31/14 1808 05/31/14 2140 06/01/14 0633 06/01/14 1122  GLUCAP 222* 371* 150* 134* 225*    Recent Labs Lab 05/29/14 1110 05/30/14 0254 05/31/14 1006 06/01/14 0455  NA 130* 131* 132* 135  K 3.5 3.6 3.6 3.5  CL 97 101 103 103  CO2 24 22 23 26   GLUCOSE 350* 252* 298* 107*  BUN 38* 44* 22 15  CREATININE 0.89 1.42* 0.84 0.72  CALCIUM 9.0 8.0* 7.8* 8.1*    Recent Labs Lab 05/29/14 1110 05/30/14 0254  AST 103* 104*  ALT 59* 64*  ALKPHOS 131* 106  BILITOT 1.1 0.9  PROT 7.7 6.2  ALBUMIN 2.9* 2.3*    Recent Labs Lab 05/29/14 1110 05/30/14 0254 05/31/14 1006 06/01/14 0455  WBC 16.4* 13.2* 10.8* 10.1   NEUTROABS 13.8*  --   --   --   HGB 13.6 11.5* 11.9* 12.0  HCT 38.8 34.1* 34.7* 35.9*  MCV 91.9 93.7 93.3 94.2  PLT 254 236 279 299    Recent Labs Lab 05/29/14 1110 05/29/14 1548 05/29/14 2108 05/30/14 0253 05/30/14 0254 05/30/14 1204  CKTOTAL 994*  --   --   --  454* 289*  TROPONINI 0.04* 0.04* 0.05* 0.06*  --   --    No results for input(s): LABPROT, INR in the last 72 hours. No results for input(s): COLORURINE, LABSPEC, [place], GLUCOSEU, HGBUR, BILIRUBINUR, KETONESUR, PROTEINUR, UROBILINOGEN, NITRITE, LEUKOCYTESUR in the last 72 hours.  Invalid input(s): APPERANCEUR     Component Value Date/Time   CHOL 92 03-27-2015 1006   TRIG 165* 03-27-2015 1006   HDL 11* 03-27-2015 1006   CHOLHDL 8.4 03-27-2015 1006   VLDL 33 03-27-2015 1006   LDLCALC 48 03-27-2015 1006   Lab Results  Component Value Date   HGBA1C 10.4* 05/29/2014   No results found for: LABOPIA, COCAINSCRNUR, LABBENZ, AMPHETMU, THCU, LABBARB  No results for input(s): ETH in the last 168 hours.  Mr Brain Wo Contrast 05/30/2014    MRI HEAD:  Acute small LEFT middle cerebral artery territory infarct (LEFT frontal lobe).  Otherwise normal MRI of the brain for age.   MRA HEAD:  Moderately motion degraded examination  without central large vessel occlusion. Distal thromboembolic disease would not be appreciated on this motion degraded examination.     Carotid Doppler  There is 1-39% bilateral ICA stenosis. Vertebral artery flow is antegrade.     PHYSICAL EXAM Obese middle aged Caucasian lady not in distress. . Afebrile. Head is nontraumatic. Neck is supple without bruit.    Cardiac exam no murmur or gallop. Lungs are clear to auscultation. Distal pulses are well felt except in RLE where bandage for recent fracture limits evaluation. Neurological Exam : Awake alert oriented 2. Moderate expressive aphasia able to speak her name and a few words but not sentences. Mild dysarthria. Good comprehension and naming.  Impaired repetition slightly. Extraocular movements are full range without nystagmus. Blinks to threat bilaterally. Fundi were not visualized. Vision acuity seems adequate. Mild right lower facial weakness. Tongue midline. Motor system exam revealed no upper or lower extremity drift. Mild diminished fine finger movements on the right. Orbits left over right upper extremity. Normal sensation. Deep tendon reflexes are symmetric. Plantars are downgoing. Gait was not tested. Right lower extremity strength testing is limited due to ankle fracture and bandage NIHSS 5 ASSESSMENT/PLAN Ms. Kristina Summers is a 79 y.o. female with history of poorly controlled diabetes, coronary artery disease status post CABG, atrial fibrillation not on anticoagulation due to history of bleeding, history a few falls presenting to [facility] following a fall with aphasia. She did not receive IV t-PA due to fall.   Stroke:  Small left MCA frontal lobe infarct secondary to atrial fibrillation  Resultant  Expressive aphasia  CTA head and neck No acute intracranial abnormality, there was 50% stenosis in aortic arch  MRI  Small L MCA frontal lobe infarct  MRA  No large vessel stenosis  Carotid Doppler  No significant stenosis   2D Echo  pending   Heparin 5000 units sq tid for VTE prophylaxis  Diet heart healthy/carb modified thin liquids  aspirin 81 mg orally every day prior to admission, now on Eliquis.  Ongoing aggressive stroke risk factor management  Therapy recommendations:  CIR recommended by PT and OT.  Disposition:  Pending. Seen by Dr Naaman Plummer. Felt to be a CIR candidate. Authorization pending.  Atrial Fibrillation  Not on anticoagulation d/t hx of bleeding and falls  Hypertension  Stable  Hyperlipidemia  Home meds:  lipitor 80 resumed in hospital  LDL 48, goal < 70  Continue statin at discharge  Diabetes type 2   HgbA1c 10.4, goal < 7.0  Uncontrolled  Other Stroke Risk  Factors  Advanced age  Former Cigarette smoker, quit smoking in 1992  Obesity, Body mass index is 31.81 kg/(m^2).   Coronary artery disease - s/p CABG w/ troponin elevation (non ischemic)  PAD  PVD  Other Active Problems  Mild rhabdomyolysis  Distal R fibular fracture  UTI, placed on Rocphin  Sepsis secondary to UTI with elevated Temperature  ARF  Anemia of chronic disease  Workup complete. Stroke team will sign off at this time. Please call if we can be of further service. Follow-up Dr. [name]   Personally examined patient and images, and have documentes history, physical, neuro exam,assessment and plan as stated above.   Sarina Ill, MD Stroke Neurology [phone] [facility]       To contact Stroke Continuity provider, please refer to [email or web address] After hours, contact General Neurology

## 2014-06-01 NOTE — Progress Notes (Signed)
  Echocardiogram 2D Echocardiogram has been performed.  Kristina Summers 06/01/2014, 10:34 AM

## 2014-06-01 NOTE — Progress Notes (Signed)
Patient ID: Kristina Summers  female  [id number]    DOB: [birth year]    DOA: 05/29/2014  PCP: [name], MD  Brief history of present illness 79 y.o. female with a past medical history of poorly controlled diabetes, coronary artery disease status post CABG, atrial fibrillation not on anticoagulation due to history of bleeding, history a few falls. She presented to [facility] long hospital status post fall, she was found lying on the floor, apparently she was ambulating to the bathroom and lost her balance and fell down. On admission, patient was hemodynamically stable. She did have a fever of 101.22F. Blood work was significant for leukocytosis of 16.4, hemoglobin 11.5, CK 994, creatinine 1.42, troponin elevation at 0.04. She was found to have right fibular fracture. Orthopedics was consulted, Ortho placed splint, no plan for surgical intervention. In addition, she was found to have UTI based on urinalysis. She was started on empiric Rocephin. On 2/11, patient was found to have some trouble finding her words but was able to respond. Patient was evaluated by neurology and recommended to transfer to [facility] for stroke workup due to expressive aphasia, sudden onset.  Assessment/Plan: Principal Problem:  Acute CVA (cerebral infarction) with sudden onset expressive aphasia: No significant improvement - MRI of the brain showed small left middle cerebral artery territory infarct, left frontal lobe - MRA showed no central large vessel occlusion, distal thromboembolic disease - 2-D echo results are pending - Initial CT angiogram head was negative for CVA, extensive atherosclerotic calcification in the aortic arch with 50% stenosis in proximal left common carotid artery. - Carotid Dopplers showed 1-39% ICA stenosis -  Speech therapy following, recommending CIR versus skilled nursing facility  - Lipid panel showed LDL 48  - Was on aspirin, however due to history of atrial fibrillation, cardiology  recommended anticoagulation. She had prior GI bleeding with supratherapeutic INR, fall risk. Given new CVA and atrial fibrillation, cardiology, Dr. [name] recommended starting eliquis, discussed with Dr. [name] who agrees    -  2-D echo pending  -PT, OT evaluation  -> CIR vs SNF   Active Problems: UTI - Urine culture showed no growth, but continue total of 3 days of IV Rocephin   Sepsis/ SIRS secondary to UTI -Currently improving, continue IV Rocephin  Right distal fibular fracture - Appreciate orthopedics recommendations, PT evaluation   Paroxysmal atrial fibrillation - Not on anticoagulation because of history of bleeding, was on daily aspirin prior to admission. - Continue metoprolol for rate control, started on eliquis per radiology and neurology recommendations, and new CVA. Hopefully patient's fall risk will significantly be lowered with supervision at skilled nursing facility.     coronary artery disease, status post CABG / troponin elevation - Troponin mildly elevated on admission, 0.04. Patient did not have complaints of chest pain. Likely demand ischemia from UTI, fall and fibular fracture.  - Cardiology was consulted, awaiting 2-D echocardiogram.   Diabetes mellitus with peripheral artery disease uncontrolled - A1c on this admission is 10.4 indicating poor glycemic control. - Increased NPH insulin 38 units twice daily along with sliding scale insulin.   Essential hypertension - Continue Norvasc, losartan and metoprolol  Acute renal failure - Resolved, Lasix currently on hold   Rhabdomyolysis - CK level 994 on the admission, improved, renal function improved patient received gentle hydration on admission.  Anemia of chronic disease - Currently stable    DVT Prophylaxis:heparin subcutaneous   Code Status: full code   Family Communication: discussed in detail with  patient's daughter   Disposition: will likely need skilled nursing facility versus [facility] Dr. [name] Neurology, Dr. [name]   Procedures  MRI, MRA brain CT angiogram of the head and neck Carotid Dopplers  Antibiotics IV Rocephin   Subjective  patient seen and examined, still has significant dysarthria and word finding difficulty    Objective  Weight change:   Intake/Output Summary (Last 24 hours) at 06/01/14 1137 Last data filed at 05/31/14 1859  Gross per 24 hour  Intake      0 ml  Output      1 ml  Net     -1 ml   Blood pressure 137/57, pulse 74, temperature 98.3 F (36.8 C), temperature source Oral, resp. rate 20, height 5\' 6"  (1.676 m), weight 89.359 kg (197 lb), SpO2 94 %.  Physical Exam: General: Alert and awake, orient, expressive aphasia CVA: Irregularly irregular  Chest: clear to auscultation bilaterally, no wheezing, rales or rhonchi Abdomen: soft nontender, nondistended, normal bowel sounds  Extremities: no cyanosis, clubbing or edema noted bilaterally, right leg dressing intact Neuro: Cranial nerves II-XII intact, significant dysarthria, mild right grip weakness, right lower extremities not tested due to ankle fracture  Lab Results: Basic Metabolic Panel:  Recent Labs Lab 05/31/14 1006 06/01/14 0455  NA 132* 135  K 3.6 3.5  CL 103 103  CO2 23 26  GLUCOSE 298* 107*  BUN 22 15  CREATININE 0.84 0.72  CALCIUM 7.8* 8.1*   Liver Function Tests:  Recent Labs Lab 05/29/14 1110 05/30/14 0254  AST 103* 104*  ALT 59* 64*  ALKPHOS 131* 106  BILITOT 1.1 0.9  PROT 7.7 6.2  ALBUMIN 2.9* 2.3*   No results for input(s): LIPASE, AMYLASE in the last 168 hours. No results for input(s): AMMONIA in the last 168 hours. CBC:  Recent Labs Lab 05/29/14 1110  05/31/14 1006 06/01/14 0455  WBC 16.4*  < > 10.8* 10.1  NEUTROABS 13.8*  --   --   --   HGB 13.6  < > 11.9* 12.0  HCT 38.8  < > 34.7* 35.9*  MCV 91.9  < > 93.3 94.2  PLT 254  < > 279 299  < > = values in this interval not displayed. Cardiac  Enzymes:  Recent Labs Lab 05/29/14 1110 05/29/14 1548 05/29/14 2108 05/30/14 0253 05/30/14 0254 05/30/14 1204  CKTOTAL 994*  --   --   --  454* 289*  TROPONINI 0.04* 0.04* 0.05* 0.06*  --   --    BNP: Invalid input(s): POCBNP CBG:  Recent Labs Lab 05/31/14 1152 05/31/14 1808 05/31/14 2140 06/01/14 0633 06/01/14 1122  GLUCAP 222* 371* 150* 134* 225*     Micro Results: Recent Results (from the past 240 hour(s))  Urine culture     Status: None   Collection Time: 05/30/14  4:00 AM  Result Value Ref Range Status   Specimen Description URINE, CATHETERIZED  Final   Special Requests NONE  Final   Colony Count NO GROWTH Performed at [facility]   Final   Culture NO GROWTH Performed at [facility]   Final   Report Status Feb 09, 202016 FINAL  Final    Studies/Results: Ct Angio Head W/cm &/or Wo Cm  05/30/2014   CLINICAL DATA:  Aphasia and weakness.  Code stroke.  EXAM: CT ANGIOGRAPHY HEAD AND NECK  TECHNIQUE: Multidetector CT imaging of the head and neck was performed using the standard protocol during bolus administration of intravenous contrast.  Multiplanar CT image reconstructions and MIPs were obtained to evaluate the vascular anatomy. Carotid stenosis measurements (when applicable) are obtained utilizing NASCET criteria, using the distal internal carotid diameter as the denominator.  CONTRAST:  141mL OMNIPAQUE IOHEXOL 350 MG/ML SOLN  COMPARISON:  None.  FINDINGS: CT HEAD  Brain: There is mild generalized cerebral atrophy, within normal limits for age. There is no evidence of acute cortical infarct, intracranial hemorrhage, mass, midline shift, or extra-axial fluid collection. Periventricular white-matter hypodensities are nonspecific but compatible with mild chronic small vessel ischemic disease. Small foci of well defined low density in the parietal lobes may reflect dilated perivascular spaces or less likely chronic lacunar infarcts.  Calvarium and skull base:  No skull fracture or aggressive osseous lesions identified.  Paranasal sinuses: Visualized paranasal sinuses and mastoid air cells are clear.  Orbits: Prior bilateral cataract extraction.  CTA NECK  Aortic arch: 3 vessel aortic arch with extensive atherosclerotic calcification. There is mild stenosis of the proximal brachiocephalic artery. There is heavy calcification throughout the proximal left subclavian artery with mild to moderate stenosis.  Right carotid system: Proximal common carotid artery is partially obscured by streak artifact. There is moderate calcification at the carotid bifurcation without significant common or internal carotid artery stenosis identified. There is mild narrowing of the proximal ECA.  Left carotid system: There is approximately 50% stenosis of the common carotid artery at its origin. There is moderate calcification in the mid to distal common carotid artery extending into the proximal ICA. No significant distal common carotid or internal carotid artery stenosis is present.  Vertebral arteries: Vertebral arteries are patent with the left being mildly dominant. No vertebral artery stenosis is identified.  Skeleton: Sequelae of prior median sternotomy are partially visualized. Advanced multilevel cervical disc degeneration is noted, worst at C3-4 where there is likely moderate spinal stenosis and severe bilateral neural foraminal stenosis.  Other neck: An 8 mm right thyroid nodule is incidentally noted. Biapical lung scarring is noted. Left submandibular gland appears to be absent.  CTA HEAD  Anterior circulation: Internal carotid arteries are patent from skullbase to carotid termini. There is moderate carotid siphon calcification bilaterally resulting in mild bilateral cavernous and supraclinoid carotid stenosis, estimated at approximately 50%. 1.5-2 mm outpouching from the distal right supraclinoid ICA in the posterior communicating artery region likely represents an infundibulum. ACAs  and MCAs are unremarkable.  Posterior circulation: Intracranial vertebral arteries are patent to the basilar with the left being dominant. There is mild bilateral vertebral artery calcification without significant stenosis. Right PICA origin is patent. Left PICA origin is not clearly identified, although it may share a common origin with the left AICA. Bilateral AICA and SCA origins are patent. Basilar artery is patent without stenosis. PCAs are unremarkable.  Venous sinuses: Patent.  Anatomic variants: None.  Delayed phase: Not performed at request of attending neurologist.  IMPRESSION: 1. No evidence of acute intracranial abnormality. 2. Mild chronic small vessel ischemic disease. 3. No evidence of major intracranial arterial occlusion. Prominent intracranial ICA calcification with mild stenosis bilaterally. 4. Extensive atherosclerotic calcification in the aortic arch with 50% stenosis of the proximal left common carotid artery. 5. Moderate bilateral carotid bifurcation atherosclerosis without significant cervical ICA stenosis. 6. Pain vertebral arteries without stenosis.  Preliminary results were called by telephone at the time of interpretation on 05/30/2014 at 11:25 am to Dr. Leonel Ramsay, who verbally acknowledged these results.   Electronically Signed   By: Logan Bores   On: 05/30/2014 12:12   Dg  Ankle Complete Right  05/29/2014   CLINICAL DATA:  Fall in bathroom with right ankle pain. Initial encounter  EXAM: RIGHT ANKLE - COMPLETE 3+ VIEW  COMPARISON:  None.  FINDINGS: Nondisplaced oblique fracture of the distal fibula, above the ankle joint. There is no medial malleolus fracture or ankle mortise widening.  Osteopenia.  IMPRESSION: Nondisplaced distal fibula fracture.   Electronically Signed   By: Monte Fantasia M.D.   On: 05/29/2014 11:52   Ct Angio Neck W/cm &/or Wo/cm  05/30/2014   CLINICAL DATA:  Aphasia and weakness.  Code stroke.  EXAM: CT ANGIOGRAPHY HEAD AND NECK  TECHNIQUE: Multidetector CT  imaging of the head and neck was performed using the standard protocol during bolus administration of intravenous contrast. Multiplanar CT image reconstructions and MIPs were obtained to evaluate the vascular anatomy. Carotid stenosis measurements (when applicable) are obtained utilizing NASCET criteria, using the distal internal carotid diameter as the denominator.  CONTRAST:  159mL OMNIPAQUE IOHEXOL 350 MG/ML SOLN  COMPARISON:  None.  FINDINGS: CT HEAD  Brain: There is mild generalized cerebral atrophy, within normal limits for age. There is no evidence of acute cortical infarct, intracranial hemorrhage, mass, midline shift, or extra-axial fluid collection. Periventricular white-matter hypodensities are nonspecific but compatible with mild chronic small vessel ischemic disease. Small foci of well defined low density in the parietal lobes may reflect dilated perivascular spaces or less likely chronic lacunar infarcts.  Calvarium and skull base: No skull fracture or aggressive osseous lesions identified.  Paranasal sinuses: Visualized paranasal sinuses and mastoid air cells are clear.  Orbits: Prior bilateral cataract extraction.  CTA NECK  Aortic arch: 3 vessel aortic arch with extensive atherosclerotic calcification. There is mild stenosis of the proximal brachiocephalic artery. There is heavy calcification throughout the proximal left subclavian artery with mild to moderate stenosis.  Right carotid system: Proximal common carotid artery is partially obscured by streak artifact. There is moderate calcification at the carotid bifurcation without significant common or internal carotid artery stenosis identified. There is mild narrowing of the proximal ECA.  Left carotid system: There is approximately 50% stenosis of the common carotid artery at its origin. There is moderate calcification in the mid to distal common carotid artery extending into the proximal ICA. No significant distal common carotid or internal  carotid artery stenosis is present.  Vertebral arteries: Vertebral arteries are patent with the left being mildly dominant. No vertebral artery stenosis is identified.  Skeleton: Sequelae of prior median sternotomy are partially visualized. Advanced multilevel cervical disc degeneration is noted, worst at C3-4 where there is likely moderate spinal stenosis and severe bilateral neural foraminal stenosis.  Other neck: An 8 mm right thyroid nodule is incidentally noted. Biapical lung scarring is noted. Left submandibular gland appears to be absent.  CTA HEAD  Anterior circulation: Internal carotid arteries are patent from skullbase to carotid termini. There is moderate carotid siphon calcification bilaterally resulting in mild bilateral cavernous and supraclinoid carotid stenosis, estimated at approximately 50%. 1.5-2 mm outpouching from the distal right supraclinoid ICA in the posterior communicating artery region likely represents an infundibulum. ACAs and MCAs are unremarkable.  Posterior circulation: Intracranial vertebral arteries are patent to the basilar with the left being dominant. There is mild bilateral vertebral artery calcification without significant stenosis. Right PICA origin is patent. Left PICA origin is not clearly identified, although it may share a common origin with the left AICA. Bilateral AICA and SCA origins are patent. Basilar artery is patent without stenosis. PCAs are unremarkable.  Venous sinuses: Patent.  Anatomic variants: None.  Delayed phase: Not performed at request of attending neurologist.  IMPRESSION: 1. No evidence of acute intracranial abnormality. 2. Mild chronic small vessel ischemic disease. 3. No evidence of major intracranial arterial occlusion. Prominent intracranial ICA calcification with mild stenosis bilaterally. 4. Extensive atherosclerotic calcification in the aortic arch with 50% stenosis of the proximal left common carotid artery. 5. Moderate bilateral carotid  bifurcation atherosclerosis without significant cervical ICA stenosis. 6. Pain vertebral arteries without stenosis.  Preliminary results were called by telephone at the time of interpretation on 05/30/2014 at 11:25 am to Dr. Leonel Ramsay, who verbally acknowledged these results.   Electronically Signed   By: Logan Bores   On: 05/30/2014 12:12   Mr Brain Wo Contrast  05/30/2014   CLINICAL DATA:  Status post fall with ankle fracture, altered mental status while in the hospital, difficulty speaking. History of atrial fibrillation, status post ablation. History of diabetes, hypertension, hyperlipidemia, carotid artery occlusion.  EXAM: MRI HEAD WITHOUT CONTRAST  MRA HEAD WITHOUT CONTRAST  TECHNIQUE: Multiplanar, multiecho pulse sequences of the brain and surrounding structures were obtained without intravenous contrast. Angiographic images of the head were obtained using MRA technique without contrast.  COMPARISON:  CT of the head May 30, 2014 at 11:08 a.m.  FINDINGS: MRI HEAD FINDINGS  Mildly motion degraded examination.  3.9 x 1.9 cm area reduced diffusion within LEFT frontal lobe extending to the cortex, corresponding low ADC values. Very minimal T2 hyperintense signal. No susceptibility artifact to suggest hemorrhage.  Ventricles and sulci are overall normal for patient's age. No midline shift, mass effect or mass lesions. Mild white matter changes can be seen with chronic small vessel ischemic disease, less than expected for age.  No abnormal extra-axial fluid collections. Status post bilateral ocular lens implants. Paranasal sinuses and mastoid air cells appear well-aerated. Patient is edentulous. No abnormal sellar expansion. Cerebellar tonsils at but not below the foramen magnum. No suspicious calvarial bone marrow signal.  MRA HEAD FINDINGS  Moderately motion degraded examination.  Anterior circulation: Normal flow related enhancement of the included cervical, petrous, cavernous and supra clinoid  internal carotid arteries. Patent anterior communicating artery. Normal flow related enhancement of the anterior and middle cerebral arteries, limited assessment of distal segments due to motion.  No large vessel occlusion, central high-grade stenosis.  Posterior circulation: LEFT vertebral artery is dominant. Basilar artery is patent, with normal flow related enhancement of the main branch vessels. Normal flow related enhancement of the posterior cerebral arteries.  No large vessel occlusion, central high-grade stenosis.  IMPRESSION: MRI HEAD: Acute small LEFT middle cerebral artery territory infarct (LEFT frontal lobe).  Otherwise normal MRI of the brain for age.  MRA HEAD: Moderately motion degraded examination without central large vessel occlusion. Distal thromboembolic disease would not be appreciated on this motion degraded examination.  These results will be called to the ordering clinician or representative by the Radiologist Assistant, and communication documented in the PACS or zVision Dashboard.   Electronically Signed   By: Elon Alas   On: 05/30/2014 22:52   Mr Jodene Nam Headm  05/30/2014   CLINICAL DATA:  Status post fall with ankle fracture, altered mental status while in the hospital, difficulty speaking. History of atrial fibrillation, status post ablation. History of diabetes, hypertension, hyperlipidemia, carotid artery occlusion.  EXAM: MRI HEAD WITHOUT CONTRAST  MRA HEAD WITHOUT CONTRAST  TECHNIQUE: Multiplanar, multiecho pulse sequences of the brain and surrounding structures were obtained without intravenous contrast. Angiographic images of  the head were obtained using MRA technique without contrast.  COMPARISON:  CT of the head May 30, 2014 at 11:08 a.m.  FINDINGS: MRI HEAD FINDINGS  Mildly motion degraded examination.  3.9 x 1.9 cm area reduced diffusion within LEFT frontal lobe extending to the cortex, corresponding low ADC values. Very minimal T2 hyperintense signal. No  susceptibility artifact to suggest hemorrhage.  Ventricles and sulci are overall normal for patient's age. No midline shift, mass effect or mass lesions. Mild white matter changes can be seen with chronic small vessel ischemic disease, less than expected for age.  No abnormal extra-axial fluid collections. Status post bilateral ocular lens implants. Paranasal sinuses and mastoid air cells appear well-aerated. Patient is edentulous. No abnormal sellar expansion. Cerebellar tonsils at but not below the foramen magnum. No suspicious calvarial bone marrow signal.  MRA HEAD FINDINGS  Moderately motion degraded examination.  Anterior circulation: Normal flow related enhancement of the included cervical, petrous, cavernous and supra clinoid internal carotid arteries. Patent anterior communicating artery. Normal flow related enhancement of the anterior and middle cerebral arteries, limited assessment of distal segments due to motion.  No large vessel occlusion, central high-grade stenosis.  Posterior circulation: LEFT vertebral artery is dominant. Basilar artery is patent, with normal flow related enhancement of the main branch vessels. Normal flow related enhancement of the posterior cerebral arteries.  No large vessel occlusion, central high-grade stenosis.  IMPRESSION: MRI HEAD: Acute small LEFT middle cerebral artery territory infarct (LEFT frontal lobe).  Otherwise normal MRI of the brain for age.  MRA HEAD: Moderately motion degraded examination without central large vessel occlusion. Distal thromboembolic disease would not be appreciated on this motion degraded examination.  These results will be called to the ordering clinician or representative by the Radiologist Assistant, and communication documented in the PACS or zVision Dashboard.   Electronically Signed   By: Elon Alas   On: 05/30/2014 22:52   Dg Hip Unilat With Pelvis 2-3 Views Right  05/29/2014   CLINICAL DATA:  Fall in bathroom with right hip  and ankle pain. Initial encounter  EXAM: RIGHT HIP (WITH PELVIS) 2-3 VIEWS  COMPARISON:  None.  FINDINGS: When accounting for marginal spurring, there is no evidence of femoral neck or other hip fracture. The hips are located. There is mild degenerative spurring about both hips without asymmetric joint narrowing. No evidence of pelvic ring fracture or diastasis.  IMPRESSION: No acute osseous findings.   Electronically Signed   By: Monte Fantasia M.D.   On: 05/29/2014 11:54    Medications: Scheduled Meds: . amLODipine  5 mg Oral Daily  . apixaban  5 mg Oral BID  . atorvastatin  80 mg Oral Daily  . cefTRIAXone (ROCEPHIN)  IV  1 g Intravenous Q24H  . docusate sodium  100 mg Oral BID  . insulin aspart  0-15 Units Subcutaneous TID WC  . insulin aspart  0-5 Units Subcutaneous QHS  . insulin NPH Human  38 Units Subcutaneous BID AC & HS  . metoprolol tartrate  25 mg Oral BID  . nystatin   Topical BID  . polyethylene glycol  17 g Oral Daily  . sodium chloride  3 mL Intravenous Q12H    time spent 35 minutes   LOS: 3 days   Izayiah Tibbitts M.D. [facility] 06/01/2014, 11:37 AM Pager: [phone]  If 7PM-7AM, please contact night-coverage [email or web address] Password [email or web address]

## 2014-06-02 DIAGNOSIS — W19XXXD Unspecified fall, subsequent encounter: Secondary | ICD-10-CM

## 2014-06-02 LAB — CBC
HCT: 36.1 % (ref 36.0–46.0)
HEMOGLOBIN: 12.1 g/dL (ref 12.0–15.0)
MCH: 31.7 pg (ref 26.0–34.0)
MCHC: 33.5 g/dL (ref 30.0–36.0)
MCV: 94.5 fL (ref 78.0–100.0)
Platelets: 326 10*3/uL (ref 150–400)
RBC: 3.82 MIL/uL — AB (ref 3.87–5.11)
RDW: 13.3 % (ref 11.5–15.5)
WBC: 9.9 10*3/uL (ref 4.0–10.5)

## 2014-06-02 LAB — BASIC METABOLIC PANEL
Anion gap: 6 (ref 5–15)
BUN: 10 mg/dL (ref 6–23)
CALCIUM: 8.4 mg/dL (ref 8.4–10.5)
CHLORIDE: 106 mmol/L (ref 96–112)
CO2: 24 mmol/L (ref 19–32)
CREATININE: 0.68 mg/dL (ref 0.50–1.10)
GFR calc non Af Amer: 83 mL/min — ABNORMAL LOW (ref >90)
Glucose, Bld: 94 mg/dL (ref 70–99)
Potassium: 3.7 mmol/L (ref 3.5–5.1)
SODIUM: 136 mmol/L (ref 135–145)

## 2014-06-02 LAB — GLUCOSE, CAPILLARY
GLUCOSE-CAPILLARY: 182 mg/dL — AB (ref 70–99)
GLUCOSE-CAPILLARY: 140 mg/dL — AB (ref 70–99)
Glucose-Capillary: 86 mg/dL (ref 70–99)
Glucose-Capillary: 251 mg/dL — ABNORMAL HIGH (ref 70–99)

## 2014-06-02 NOTE — Progress Notes (Signed)
Patient ID: Kristina Summers  female  [id number]    DOB: 1935/04/27    DOA: 05/29/2014  PCP: [name], MD  Brief history of present illness 79 y.o. female with a past medical history of poorly controlled diabetes, coronary artery disease status post CABG, atrial fibrillation not on anticoagulation due to history of bleeding, history a few falls. She presented to [facility] long hospital status post fall, she was found lying on the floor, apparently she was ambulating to the bathroom and lost her balance and fell down. On admission, patient was hemodynamically stable. She did have a fever of 101.60F. Blood work was significant for leukocytosis of 16.4, hemoglobin 11.5, CK 994, creatinine 1.42, troponin elevation at 0.04. She was found to have right fibular fracture. Orthopedics was consulted, Ortho placed splint, no plan for surgical intervention. In addition, she was found to have UTI based on urinalysis. She was started on empiric Rocephin. On 2/11, patient was found to have some trouble finding her words but was able to respond. Patient was evaluated by neurology and recommended to transfer to [facility] for stroke workup due to expressive aphasia, sudden onset.  Assessment/Plan: Principal Problem:  Acute CVA (cerebral infarction) with sudden onset expressive aphasia: Slight  improvement - MRI of the brain showed small left middle cerebral artery territory infarct, left frontal lobe - MRA showed no central large vessel occlusion, distal thromboembolic disease - 2-D echo showed EF of 55-60%, normal wall motion, mild to moderate aortic stenosis, mild regurgitation, left bundle branch block - Initial CT angiogram head was negative for CVA, extensive atherosclerotic calcification in the aortic arch with 50% stenosis in proximal left common carotid artery. - Carotid Dopplers showed 1-39% ICA stenosis - Lipid panel showed LDL 48  - Was on aspirin, however due to history of atrial fibrillation,  cardiology recommended anticoagulation. She had prior GI bleeding with supratherapeutic INR, fall risk. Given new CVA and atrial fibrillation, cardiology, Dr. [name] recommended starting eliquis, discussed with Dr. [name] who agrees    -PT, OT, ST evaluation  -> CIR vs SNF   Active Problems: UTI - Urine culture showed no growth, but continue total of 3 days of IV Rocephin   Sepsis/ SIRS secondary to UTI -Currently improving, continue IV Rocephin  Right distal fibular fracture - Appreciate orthopedics recommendations, PT evaluation   Paroxysmal atrial fibrillation - Not on anticoagulation because of history of bleeding, was on daily aspirin prior to admission. - Continue metoprolol for rate control, started on eliquis per radiology and neurology recommendations, and new CVA. Hopefully patient's fall risk will significantly be lowered with supervision at skilled nursing facility.     coronary artery disease, status post CABG / troponin elevation - Troponin mildly elevated on admission, 0.04. Patient did not have complaints of chest pain. Likely demand ischemia from UTI, fall and fibular fracture.  - Cardiology was consulted, awaiting 2-D echocardiogram.   Diabetes mellitus with peripheral artery disease uncontrolled - A1c on this admission is 10.4 indicating poor glycemic control. - Increased NPH insulin 38 units twice daily along with sliding scale insulin.   Essential hypertension - Continue Norvasc, losartan and metoprolol  Acute renal failure - Resolved, Lasix currently on hold   Rhabdomyolysis - CK level 994 on the admission, improved, renal function improved patient received gentle hydration on admission.  Anemia of chronic disease - Currently stable    DVT Prophylaxis:heparin subcutaneous   Code Status: full code   Family Communication:   Disposition: will likely need  skilled nursing facility versus [facility] Dr. [name] Neurology, Dr.  [name]   Procedures  MRI, MRA brain CT angiogram of the head and neck Carotid Dopplers  Antibiotics IV Rocephin   Subjective  patient seen and examined, dysarthria slightly improving   Objective  Weight change:   Intake/Output Summary (Last 24 hours) at 06/02/14 1205 Last data filed at 06/02/14 0829  Gross per 24 hour  Intake    240 ml  Output    500 ml  Net   -260 ml   Blood pressure 133/56, pulse 82, temperature 98 F (36.7 C), temperature source Oral, resp. rate 20, height 5\' 6"  (1.676 m), weight 89.359 kg (197 lb), SpO2 98 %.  Physical Exam: General: Alert and awake, orient, expressive aphasia CVA: Irregularly irregular  Chest: clear to auscultation bilaterally, no wheezing, rales or rhonchi Abdomen: soft nontender, nondistended, normal bowel sounds  Extremities: no cyanosis, clubbing or edema noted bilaterally, right leg dressing intact Neuro: Cranial nerves II-XII intact, significant dysarthria, mild right grip weakness, right lower extremities not tested due to ankle fracture  Lab Results: Basic Metabolic Panel:  Recent Labs Lab 06/01/14 0455 06/02/14 0502  NA 135 136  K 3.5 3.7  CL 103 106  CO2 26 24  GLUCOSE 107* 94  BUN 15 10  CREATININE 0.72 0.68  CALCIUM 8.1* 8.4   Liver Function Tests:  Recent Labs Lab 05/29/14 1110 05/30/14 0254  AST 103* 104*  ALT 59* 64*  ALKPHOS 131* 106  BILITOT 1.1 0.9  PROT 7.7 6.2  ALBUMIN 2.9* 2.3*   No results for input(s): LIPASE, AMYLASE in the last 168 hours. No results for input(s): AMMONIA in the last 168 hours. CBC:  Recent Labs Lab 05/29/14 1110  06/01/14 0455 06/02/14 0502  WBC 16.4*  < > 10.1 9.9  NEUTROABS 13.8*  --   --   --   HGB 13.6  < > 12.0 12.1  HCT 38.8  < > 35.9* 36.1  MCV 91.9  < > 94.2 94.5  PLT 254  < > 299 326  < > = values in this interval not displayed. Cardiac Enzymes:  Recent Labs Lab 05/29/14 1110 05/29/14 1548 05/29/14 2108 05/30/14 0253 05/30/14 0254  05/30/14 1204  CKTOTAL 994*  --   --   --  454* 289*  TROPONINI 0.04* 0.04* 0.05* 0.06*  --   --    BNP: Invalid input(s): POCBNP CBG:  Recent Labs Lab 06/01/14 1122 06/01/14 1621 06/01/14 2155 06/02/14 0707 06/02/14 1155  GLUCAP 225* 161* 164* 86 182*     Micro Results: Recent Results (from the past 240 hour(s))  Urine culture     Status: None   Collection Time: 05/30/14  4:00 AM  Result Value Ref Range Status   Specimen Description URINE, CATHETERIZED  Final   Special Requests NONE  Final   Colony Count NO GROWTH Performed at [facility]   Final   Culture NO GROWTH Performed at [facility]   Final   Report Status 02/25/202016 FINAL  Final    Studies/Results: Ct Angio Head W/cm &/or Wo Cm  05/30/2014   CLINICAL DATA:  Aphasia and weakness.  Code stroke.  EXAM: CT ANGIOGRAPHY HEAD AND NECK  TECHNIQUE: Multidetector CT imaging of the head and neck was performed using the standard protocol during bolus administration of intravenous contrast. Multiplanar CT image reconstructions and MIPs were obtained to evaluate the vascular anatomy. Carotid stenosis measurements (when applicable) are obtained  utilizing NASCET criteria, using the distal internal carotid diameter as the denominator.  CONTRAST:  124mL OMNIPAQUE IOHEXOL 350 MG/ML SOLN  COMPARISON:  None.  FINDINGS: CT HEAD  Brain: There is mild generalized cerebral atrophy, within normal limits for age. There is no evidence of acute cortical infarct, intracranial hemorrhage, mass, midline shift, or extra-axial fluid collection. Periventricular white-matter hypodensities are nonspecific but compatible with mild chronic small vessel ischemic disease. Small foci of well defined low density in the parietal lobes may reflect dilated perivascular spaces or less likely chronic lacunar infarcts.  Calvarium and skull base: No skull fracture or aggressive osseous lesions identified.  Paranasal sinuses: Visualized paranasal  sinuses and mastoid air cells are clear.  Orbits: Prior bilateral cataract extraction.  CTA NECK  Aortic arch: 3 vessel aortic arch with extensive atherosclerotic calcification. There is mild stenosis of the proximal brachiocephalic artery. There is heavy calcification throughout the proximal left subclavian artery with mild to moderate stenosis.  Right carotid system: Proximal common carotid artery is partially obscured by streak artifact. There is moderate calcification at the carotid bifurcation without significant common or internal carotid artery stenosis identified. There is mild narrowing of the proximal ECA.  Left carotid system: There is approximately 50% stenosis of the common carotid artery at its origin. There is moderate calcification in the mid to distal common carotid artery extending into the proximal ICA. No significant distal common carotid or internal carotid artery stenosis is present.  Vertebral arteries: Vertebral arteries are patent with the left being mildly dominant. No vertebral artery stenosis is identified.  Skeleton: Sequelae of prior median sternotomy are partially visualized. Advanced multilevel cervical disc degeneration is noted, worst at C3-4 where there is likely moderate spinal stenosis and severe bilateral neural foraminal stenosis.  Other neck: An 8 mm right thyroid nodule is incidentally noted. Biapical lung scarring is noted. Left submandibular gland appears to be absent.  CTA HEAD  Anterior circulation: Internal carotid arteries are patent from skullbase to carotid termini. There is moderate carotid siphon calcification bilaterally resulting in mild bilateral cavernous and supraclinoid carotid stenosis, estimated at approximately 50%. 1.5-2 mm outpouching from the distal right supraclinoid ICA in the posterior communicating artery region likely represents an infundibulum. ACAs and MCAs are unremarkable.  Posterior circulation: Intracranial vertebral arteries are patent to the  basilar with the left being dominant. There is mild bilateral vertebral artery calcification without significant stenosis. Right PICA origin is patent. Left PICA origin is not clearly identified, although it may share a common origin with the left AICA. Bilateral AICA and SCA origins are patent. Basilar artery is patent without stenosis. PCAs are unremarkable.  Venous sinuses: Patent.  Anatomic variants: None.  Delayed phase: Not performed at request of attending neurologist.  IMPRESSION: 1. No evidence of acute intracranial abnormality. 2. Mild chronic small vessel ischemic disease. 3. No evidence of major intracranial arterial occlusion. Prominent intracranial ICA calcification with mild stenosis bilaterally. 4. Extensive atherosclerotic calcification in the aortic arch with 50% stenosis of the proximal left common carotid artery. 5. Moderate bilateral carotid bifurcation atherosclerosis without significant cervical ICA stenosis. 6. Pain vertebral arteries without stenosis.  Preliminary results were called by telephone at the time of interpretation on 05/30/2014 at 11:25 am to Dr. Leonel Ramsay, who verbally acknowledged these results.   Electronically Signed   By: Logan Bores   On: 05/30/2014 12:12   Dg Ankle Complete Right  05/29/2014   CLINICAL DATA:  Fall in bathroom with right ankle pain. Initial encounter  EXAM: RIGHT ANKLE - COMPLETE 3+ VIEW  COMPARISON:  None.  FINDINGS: Nondisplaced oblique fracture of the distal fibula, above the ankle joint. There is no medial malleolus fracture or ankle mortise widening.  Osteopenia.  IMPRESSION: Nondisplaced distal fibula fracture.   Electronically Signed   By: Monte Fantasia M.D.   On: 05/29/2014 11:52   Ct Angio Neck W/cm &/or Wo/cm  05/30/2014   CLINICAL DATA:  Aphasia and weakness.  Code stroke.  EXAM: CT ANGIOGRAPHY HEAD AND NECK  TECHNIQUE: Multidetector CT imaging of the head and neck was performed using the standard protocol during bolus administration of  intravenous contrast. Multiplanar CT image reconstructions and MIPs were obtained to evaluate the vascular anatomy. Carotid stenosis measurements (when applicable) are obtained utilizing NASCET criteria, using the distal internal carotid diameter as the denominator.  CONTRAST:  197mL OMNIPAQUE IOHEXOL 350 MG/ML SOLN  COMPARISON:  None.  FINDINGS: CT HEAD  Brain: There is mild generalized cerebral atrophy, within normal limits for age. There is no evidence of acute cortical infarct, intracranial hemorrhage, mass, midline shift, or extra-axial fluid collection. Periventricular white-matter hypodensities are nonspecific but compatible with mild chronic small vessel ischemic disease. Small foci of well defined low density in the parietal lobes may reflect dilated perivascular spaces or less likely chronic lacunar infarcts.  Calvarium and skull base: No skull fracture or aggressive osseous lesions identified.  Paranasal sinuses: Visualized paranasal sinuses and mastoid air cells are clear.  Orbits: Prior bilateral cataract extraction.  CTA NECK  Aortic arch: 3 vessel aortic arch with extensive atherosclerotic calcification. There is mild stenosis of the proximal brachiocephalic artery. There is heavy calcification throughout the proximal left subclavian artery with mild to moderate stenosis.  Right carotid system: Proximal common carotid artery is partially obscured by streak artifact. There is moderate calcification at the carotid bifurcation without significant common or internal carotid artery stenosis identified. There is mild narrowing of the proximal ECA.  Left carotid system: There is approximately 50% stenosis of the common carotid artery at its origin. There is moderate calcification in the mid to distal common carotid artery extending into the proximal ICA. No significant distal common carotid or internal carotid artery stenosis is present.  Vertebral arteries: Vertebral arteries are patent with the left being  mildly dominant. No vertebral artery stenosis is identified.  Skeleton: Sequelae of prior median sternotomy are partially visualized. Advanced multilevel cervical disc degeneration is noted, worst at C3-4 where there is likely moderate spinal stenosis and severe bilateral neural foraminal stenosis.  Other neck: An 8 mm right thyroid nodule is incidentally noted. Biapical lung scarring is noted. Left submandibular gland appears to be absent.  CTA HEAD  Anterior circulation: Internal carotid arteries are patent from skullbase to carotid termini. There is moderate carotid siphon calcification bilaterally resulting in mild bilateral cavernous and supraclinoid carotid stenosis, estimated at approximately 50%. 1.5-2 mm outpouching from the distal right supraclinoid ICA in the posterior communicating artery region likely represents an infundibulum. ACAs and MCAs are unremarkable.  Posterior circulation: Intracranial vertebral arteries are patent to the basilar with the left being dominant. There is mild bilateral vertebral artery calcification without significant stenosis. Right PICA origin is patent. Left PICA origin is not clearly identified, although it may share a common origin with the left AICA. Bilateral AICA and SCA origins are patent. Basilar artery is patent without stenosis. PCAs are unremarkable.  Venous sinuses: Patent.  Anatomic variants: None.  Delayed phase: Not performed at request of attending neurologist.  IMPRESSION:  1. No evidence of acute intracranial abnormality. 2. Mild chronic small vessel ischemic disease. 3. No evidence of major intracranial arterial occlusion. Prominent intracranial ICA calcification with mild stenosis bilaterally. 4. Extensive atherosclerotic calcification in the aortic arch with 50% stenosis of the proximal left common carotid artery. 5. Moderate bilateral carotid bifurcation atherosclerosis without significant cervical ICA stenosis. 6. Pain vertebral arteries without  stenosis.  Preliminary results were called by telephone at the time of interpretation on 05/30/2014 at 11:25 am to Dr. Leonel Ramsay, who verbally acknowledged these results.   Electronically Signed   By: Logan Bores   On: 05/30/2014 12:12   Mr Brain Wo Contrast  05/30/2014   CLINICAL DATA:  Status post fall with ankle fracture, altered mental status while in the hospital, difficulty speaking. History of atrial fibrillation, status post ablation. History of diabetes, hypertension, hyperlipidemia, carotid artery occlusion.  EXAM: MRI HEAD WITHOUT CONTRAST  MRA HEAD WITHOUT CONTRAST  TECHNIQUE: Multiplanar, multiecho pulse sequences of the brain and surrounding structures were obtained without intravenous contrast. Angiographic images of the head were obtained using MRA technique without contrast.  COMPARISON:  CT of the head May 30, 2014 at 11:08 a.m.  FINDINGS: MRI HEAD FINDINGS  Mildly motion degraded examination.  3.9 x 1.9 cm area reduced diffusion within LEFT frontal lobe extending to the cortex, corresponding low ADC values. Very minimal T2 hyperintense signal. No susceptibility artifact to suggest hemorrhage.  Ventricles and sulci are overall normal for patient's age. No midline shift, mass effect or mass lesions. Mild white matter changes can be seen with chronic small vessel ischemic disease, less than expected for age.  No abnormal extra-axial fluid collections. Status post bilateral ocular lens implants. Paranasal sinuses and mastoid air cells appear well-aerated. Patient is edentulous. No abnormal sellar expansion. Cerebellar tonsils at but not below the foramen magnum. No suspicious calvarial bone marrow signal.  MRA HEAD FINDINGS  Moderately motion degraded examination.  Anterior circulation: Normal flow related enhancement of the included cervical, petrous, cavernous and supra clinoid internal carotid arteries. Patent anterior communicating artery. Normal flow related enhancement of the anterior  and middle cerebral arteries, limited assessment of distal segments due to motion.  No large vessel occlusion, central high-grade stenosis.  Posterior circulation: LEFT vertebral artery is dominant. Basilar artery is patent, with normal flow related enhancement of the main branch vessels. Normal flow related enhancement of the posterior cerebral arteries.  No large vessel occlusion, central high-grade stenosis.  IMPRESSION: MRI HEAD: Acute small LEFT middle cerebral artery territory infarct (LEFT frontal lobe).  Otherwise normal MRI of the brain for age.  MRA HEAD: Moderately motion degraded examination without central large vessel occlusion. Distal thromboembolic disease would not be appreciated on this motion degraded examination.  These results will be called to the ordering clinician or representative by the Radiologist Assistant, and communication documented in the PACS or zVision Dashboard.   Electronically Signed   By: Elon Alas   On: 05/30/2014 22:52   Mr Jodene Nam Headm  05/30/2014   CLINICAL DATA:  Status post fall with ankle fracture, altered mental status while in the hospital, difficulty speaking. History of atrial fibrillation, status post ablation. History of diabetes, hypertension, hyperlipidemia, carotid artery occlusion.  EXAM: MRI HEAD WITHOUT CONTRAST  MRA HEAD WITHOUT CONTRAST  TECHNIQUE: Multiplanar, multiecho pulse sequences of the brain and surrounding structures were obtained without intravenous contrast. Angiographic images of the head were obtained using MRA technique without contrast.  COMPARISON:  CT of the head May 30, 2014  at 11:08 a.m.  FINDINGS: MRI HEAD FINDINGS  Mildly motion degraded examination.  3.9 x 1.9 cm area reduced diffusion within LEFT frontal lobe extending to the cortex, corresponding low ADC values. Very minimal T2 hyperintense signal. No susceptibility artifact to suggest hemorrhage.  Ventricles and sulci are overall normal for patient's age. No midline  shift, mass effect or mass lesions. Mild white matter changes can be seen with chronic small vessel ischemic disease, less than expected for age.  No abnormal extra-axial fluid collections. Status post bilateral ocular lens implants. Paranasal sinuses and mastoid air cells appear well-aerated. Patient is edentulous. No abnormal sellar expansion. Cerebellar tonsils at but not below the foramen magnum. No suspicious calvarial bone marrow signal.  MRA HEAD FINDINGS  Moderately motion degraded examination.  Anterior circulation: Normal flow related enhancement of the included cervical, petrous, cavernous and supra clinoid internal carotid arteries. Patent anterior communicating artery. Normal flow related enhancement of the anterior and middle cerebral arteries, limited assessment of distal segments due to motion.  No large vessel occlusion, central high-grade stenosis.  Posterior circulation: LEFT vertebral artery is dominant. Basilar artery is patent, with normal flow related enhancement of the main branch vessels. Normal flow related enhancement of the posterior cerebral arteries.  No large vessel occlusion, central high-grade stenosis.  IMPRESSION: MRI HEAD: Acute small LEFT middle cerebral artery territory infarct (LEFT frontal lobe).  Otherwise normal MRI of the brain for age.  MRA HEAD: Moderately motion degraded examination without central large vessel occlusion. Distal thromboembolic disease would not be appreciated on this motion degraded examination.  These results will be called to the ordering clinician or representative by the Radiologist Assistant, and communication documented in the PACS or zVision Dashboard.   Electronically Signed   By: Elon Alas   On: 05/30/2014 22:52   Dg Hip Unilat With Pelvis 2-3 Views Right  05/29/2014   CLINICAL DATA:  Fall in bathroom with right hip and ankle pain. Initial encounter  EXAM: RIGHT HIP (WITH PELVIS) 2-3 VIEWS  COMPARISON:  None.  FINDINGS: When  accounting for marginal spurring, there is no evidence of femoral neck or other hip fracture. The hips are located. There is mild degenerative spurring about both hips without asymmetric joint narrowing. No evidence of pelvic ring fracture or diastasis.  IMPRESSION: No acute osseous findings.   Electronically Signed   By: Monte Fantasia M.D.   On: 05/29/2014 11:54    Medications: Scheduled Meds: . amLODipine  5 mg Oral Daily  . apixaban  5 mg Oral BID  . atorvastatin  80 mg Oral Daily  . cefTRIAXone (ROCEPHIN)  IV  1 g Intravenous Q24H  . docusate sodium  100 mg Oral BID  . insulin aspart  0-15 Units Subcutaneous TID WC  . insulin aspart  0-5 Units Subcutaneous QHS  . insulin NPH Human  38 Units Subcutaneous BID AC & HS  . metoprolol tartrate  25 mg Oral BID  . nystatin   Topical BID  . polyethylene glycol  17 g Oral Daily  . sodium chloride  3 mL Intravenous Q12H    time spent 35 minutes   LOS: 4 days   Harjas Biggins M.D. [facility] 06/02/2014, 12:05 PM Pager: [phone]  If 7PM-7AM, please contact night-coverage [email or web address] Password [email or web address]

## 2014-06-02 NOTE — Social Work (Signed)
Clinical Social Work Department BRIEF PSYCHOSOCIAL ASSESSMENT 06/02/2014  Patient:  Kristina Summers, Kristina Summers     Account Number:  [id number]     Admit date:  05/29/2014  Clinical Social Worker:  Lenord Fellers  Date/Time:  06/02/2014 05:47 PM  Referred by:  Physician  Date Referred:  [id number] Referred for  SNF Placement   Other Referral:   Interview type:  Other - See comment Other interview type:   Assessed patient with daughter, Kristina Summers and 2 adult grandchildren.    PSYCHOSOCIAL DATA Living Status:  ALONE Admitted from facility:   Level of care:   Primary support name:  Kristina Summers and Kristina Summers Primary support relationship to patient:  CHILD, ADULT Degree of support available:   Patient's family is very supportive and are open to helping as they can. Patient will not have 24 hour care provided by family at discharge.    CURRENT CONCERNS Current Concerns  Post-Acute Placement   Other Concerns:    SOCIAL WORK ASSESSMENT / PLAN At this time patient still is being considered for CIR. However if patient does not get CIR she and family are in agreement with going to a SNF for Short Term Rehab. Patient is looking forward to getting bettter in order to resume her previous level of activity and independence. Patient is considering current bed offers and is thinking about [facility].   Assessment/plan status:  Referral to [employer] Other assessment/ plan:   Information/referral to community resources:   Provided patient and family with list of bed offers    PATIENT'S/FAMILY'S RESPONSE TO PLAN OF CARE: Family agreeable to list of bed offers and will visit facilities. Patient is leaning toward [facility] and they have offered a bed at this time.    Christene Lye MSW, [place]

## 2014-06-02 NOTE — Social Work (Addendum)
Clinical Social Work Department CLINICAL SOCIAL WORK PLACEMENT NOTE 06/02/2014  Patient:  Kristina Summers, Kristina Summers  Account Number:  [id number] [place] date:  05/29/2014  Clinical Social Worker:  Marja Kays, LCSW  Date/time:  06/02/2014 05:55 PM  Clinical Social Work is seeking post-discharge placement for this patient at the following level of care:   SKILLED NURSING   (*CSW will update this form in Epic as items are completed)   06/02/2014  Patient/family provided with [facility] Department of Clinical Social Work's list of facilities offering this level of care within the geographic area requested by the patient (or if unable, by the patient's family).  06/02/2014  Patient/family informed of their freedom to choose among providers that offer the needed level of care, that participate in Medicare, Medicaid or managed care program needed by the patient, have an available bed and are willing to accept the patient.  06/02/2014  Patient/family informed of [facility]' ownership interest in [facility], as well as of the fact that they are under no obligation to receive care at this facility.  PASARR submitted to EDS on 06/01/2014 PASARR number received on 06/01/2014  FL2 transmitted to all facilities in geographic area requested by pt/family on  06/01/2014 FL2 transmitted to all facilities within larger geographic area on 06/01/2014  Patient informed that his/her managed care company has contracts with or will negotiate with  certain facilities, including the following:     Patient/family informed of bed offers received:  06/02/2014 Patient chooses bed at  Physician recommends and patient chooses bed at    Patient to be transferred to   [facility]   Patient to be transferred to facility by EMS Patient and family notified of transfer on 05/24/14 Name of family member notified:  Daughter-  Nunzio Cory and patient  The following physician request were entered in Epic:   Additional  Comments:   Eduard Clos, MSW, Latanya Presser [phone]

## 2014-06-03 LAB — BASIC METABOLIC PANEL
Anion gap: 5 (ref 5–15)
BUN: 10 mg/dL (ref 6–23)
CHLORIDE: 105 mmol/L (ref 96–112)
CO2: 26 mmol/L (ref 19–32)
CREATININE: 0.76 mg/dL (ref 0.50–1.10)
Calcium: 8.6 mg/dL (ref 8.4–10.5)
GFR calc Af Amer: >90 mL/min (ref >90)
GFR calc non Af Amer: 80 mL/min — ABNORMAL LOW (ref >90)
Glucose, Bld: 91 mg/dL (ref 70–99)
Potassium: 4.1 mmol/L (ref 3.5–5.1)
Sodium: 136 mmol/L (ref 135–145)

## 2014-06-03 LAB — CBC
HEMATOCRIT: 37.4 % (ref 36.0–46.0)
HEMOGLOBIN: 12.6 g/dL (ref 12.0–15.0)
MCH: 31.3 pg (ref 26.0–34.0)
MCHC: 33.7 g/dL (ref 30.0–36.0)
MCV: 93 fL (ref 78.0–100.0)
PLATELETS: 361 10*3/uL (ref 150–400)
RBC: 4.02 MIL/uL (ref 3.87–5.11)
RDW: 13.3 % (ref 11.5–15.5)
WBC: 10.7 10*3/uL — ABNORMAL HIGH (ref 4.0–10.5)

## 2014-06-03 LAB — GLUCOSE, CAPILLARY
GLUCOSE-CAPILLARY: 101 mg/dL — AB (ref 70–99)
Glucose-Capillary: 263 mg/dL — ABNORMAL HIGH (ref 70–99)

## 2014-06-03 MED ORDER — APIXABAN 5 MG PO TABS: 5.0000 mg | ORAL_TABLET | Freq: Two times a day (BID) | ORAL | Status: DC

## 2014-06-03 MED ORDER — LOSARTAN POTASSIUM 50 MG PO TABS: 50.0000 mg | ORAL_TABLET | Freq: Every day | ORAL | Status: DC

## 2014-06-03 MED ORDER — FUROSEMIDE 40 MG PO TABS: 40.0000 mg | ORAL_TABLET | Freq: Every day | ORAL | Status: DC

## 2014-06-03 MED ORDER — DOCUSATE SODIUM 100 MG PO CAPS: 100.0000 mg | ORAL_CAPSULE | Freq: Two times a day (BID) | ORAL | Status: DC

## 2014-06-03 MED ORDER — POLYETHYLENE GLYCOL 3350 17 G PO PACK: 17.0000 g | PACK | Freq: Every day | ORAL | Status: DC

## 2014-06-03 MED ORDER — INSULIN NPH (HUMAN) (ISOPHANE) 100 UNIT/ML ~~LOC~~ SUSP: 38.0000 [IU] | Freq: Two times a day (BID) | SUBCUTANEOUS | Status: DC

## 2014-06-03 MED ORDER — OXYCODONE HCL 5 MG PO TABS: 5.0000 mg | ORAL_TABLET | Freq: Four times a day (QID) | ORAL | Status: DC | PRN

## 2014-06-03 NOTE — Progress Notes (Addendum)
Rehab admissions - Evaluated for possible admission.  I met with patient, but she is aphasic.  I called and left a message with patient's daughter.  Awaiting call back from daughter.  I will need to hear back from family so that we can determine if inpatient rehab or SNF will be best option.  Call me for questions.  #[phone]  I received a call from patient's daughter, Nunzio Cory.  The family prefers that patient receive her therapies at [facility].  Family has not been please with the care at [facility] and prefer to go out of the hospital for rehab.  They have had friends at [facility] and are comfortable with rehab there.  I will let the case manager and social worker know of preferences.  Jeanice Dempsey can be reached at [phone].  Call me for questions.  #[phone]

## 2014-06-03 NOTE — Clinical Social Work Psychosocial (Signed)
CSW has updated patient and family Nunzio Cory) of SNF options and they are pleased to accept bed at [facility]- patient was able to share with me (verbally) that she had fallen and hurt her leg- she is optimistic and eager to proceed with her rehab at [facility]. CSW has also spoken with CIR RNCM who is also aware of SNF preference. CSW will update SNF and MD and anticipate SNF transfer later today.   Eduard Clos, MSW, [place]

## 2014-06-03 NOTE — Progress Notes (Signed)
Subjective:  Still aphasic but some better communication.  No shortness of breath or chest pain.  Objective:  Vital Signs in the last 24 hours: BP 159/61 mmHg  Pulse 84  Temp(Src) 98 F (36.7 C) (Oral)  Resp 20  Ht 5\' 6"  (1.676 m)  Wt 89.359 kg (197 lb)  BMI 31.81 kg/m2  SpO2 99%  Physical Exam: Pleasant WF in NAD Lungs:  Clear  Cardiac:  irregular rhythm, normal S1 and S2, no S3, 2/6 murmur Extremities: Mild swelling of right leg, right ankle wrapped.  Intake/Output from previous day: 02/14 0701 - 02/15 0700 In: 480 [P.O.:480] Out: -  Weight Filed Weights   05/29/14 1500  Weight: 89.359 kg (197 lb)    Lab Results: Basic Metabolic Panel:  Recent Labs  06/02/14 0502 06/03/14 0551  NA 136 136  K 3.7 4.1  CL 106 105  CO2 24 26  GLUCOSE 94 91  BUN 10 10  CREATININE 0.68 0.76    CBC:  Recent Labs  06/02/14 0502 06/03/14 0551  WBC 9.9 10.7*  HGB 12.1 12.6  HCT 36.1 37.4  MCV 94.5 93.0  PLT 326 361   Telemetry: In NSR today.  Assessment/Plan:  1. 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 CAD stable 3. Low level troponin elevation not ischemic 4. Paroxysmal atrial fibrillation back in NSR. 5. Acute renal insufficiency will need to watch  Rec:  ECHO OK.  On ELiquis now but will need to watch for GI bleed.       Kristina Hough  MD [facility]  06/03/2014, 9:45 AM

## 2014-06-03 NOTE — Clinical Social Work Note (Signed)
SNF bed secured for patient at [facility] for today.  Patient and family agreeable to this plan- plan transfer vis EMS. Family very pleased with the location of SNF bed-    Eduard Clos, MSW, [place]

## 2014-06-03 NOTE — Discharge Summary (Signed)
Physician Discharge Summary  Patient ID: Kristina Summers MRN: [id number] DOB/AGE: [birth year] 79 y.o.  Admit date: 05/29/2014 Discharge date: 06/03/2014  Primary Care Physician:  Kristina Ny, MD  Discharge Diagnoses:   . Acute CVA (cerebral infarction) with dysarthria  . UTI (lower urinary tract infection) . Fracture of distal end of right fibula . CAD (coronary artery disease) . Paroxysmal atrial fibrillation . Diabetes mellitus type 2 with peripheral artery disease . Obesity (BMI 30-39.9) . Rhabdomyolysis . Transaminitis   Consults:  Orthopedics, Dr. Alvan Summers  Neurology, Dr. Leonie Summers   cardiology, Dr. Wynonia Summers    Recommendations for Outpatient Follow-up:   please note patient is started on Eliquis due to new diagnosis of acute CVA, has history of paroxysmal atrial fibrillation   Strict fall precautions   per orthopedics Currently in posterior splint NWB RLE  PT order placed for mobility assessment and recommendations She lives alone and may require ST SNF for rehab  RTC in 2 weeks for X-rays and hopeful progression in weight bearing status  TESTS THAT NEED FOLLOW-UP BMET in 3 days   DIET:  carb modified diet    Allergies:   Allergies  Allergen Reactions  . Morphine And Related Nausea And Vomiting    Chest pain    . Lisinopril Cough  . Sertraline Hcl     REACTION: nausea  . Zoloft [Sertraline Hcl] Other (See Comments)    Nausea      Discharge Medications:   Medication List    STOP taking these medications        aspirin 81 MG tablet     sulfamethoxazole-trimethoprim 800-160 MG per tablet  Commonly known as:  BACTRIM DS      TAKE these medications        acetaminophen 325 MG tablet  Commonly known as:  TYLENOL  Take 2 tablets (650 mg total) by mouth every 6 (six) hours as needed for mild pain (or Fever >/= 101).     amLODipine 5 MG tablet  Commonly known as:  NORVASC  Take 1 tablet by mouth daily.     apixaban 5 MG Tabs tablet  Commonly known as:   ELIQUIS  Take 1 tablet (5 mg total) by mouth 2 (two) times daily.     atorvastatin 80 MG tablet  Commonly known as:  LIPITOR  Take 1 tablet (80 mg total) by mouth daily.     docusate sodium 100 MG capsule  Commonly known as:  COLACE  Take 1 capsule (100 mg total) by mouth 2 (two) times daily.     furosemide 40 MG tablet  Commonly known as:  LASIX  Take 1 tablet (40 mg total) by mouth daily.     HUMULIN R 100 units/mL injection  Generic drug:  insulin regular  Inject 5 Units into the skin at bedtime.     insulin NPH Human 100 UNIT/ML injection  Commonly known as:  HUMULIN N,NOVOLIN N  Inject 37 Units into the skin 2 (two) times daily. 38 in am and 34 at night     losartan 50 MG tablet  Commonly known as:  COZAAR  Take 1 tablet (50 mg total) by mouth daily.     metoprolol tartrate 25 MG tablet  Commonly known as:  LOPRESSOR  Take 1 tablet by mouth 2 (two) times daily.     NITROSTAT 0.4 MG SL tablet  Generic drug:  nitroGLYCERIN  Place 1 tablet under the tongue every 5 (five) minutes x 3 doses as needed. Chest  pain     ONE-A-DAY 50 PLUS PO  Take 1 tablet by mouth daily.     oxyCODONE 5 MG immediate release tablet  Commonly known as:  Oxy IR/ROXICODONE  Take 1 tablet (5 mg total) by mouth every 6 (six) hours as needed for severe pain.     polyethylene glycol packet  Commonly known as:  MIRALAX / GLYCOLAX  Take 17 g by mouth daily.         Brief H and P: For complete details please refer to admission H and P, but in brief 79 y.o. female with a past medical history of poorly controlled diabetes, coronary artery disease status post CABG, atrial fibrillation not on anticoagulation due to history of bleeding, history a few falls. She presented to [facility] long hospital status post fall, she was found lying on the floor, apparently she was ambulating to the bathroom and lost her balance and fell down. On admission, patient was hemodynamically stable. She did have a fever of  101.49F. Blood work was significant for leukocytosis of 16.4, hemoglobin 11.5, CK 994, creatinine 1.42, troponin elevation at 0.04. She was found to have right fibular fracture. Orthopedics was consulted, Ortho placed splint, no plan for surgical intervention. In addition, she was found to have UTI based on urinalysis. She was started on empiric Rocephin. On 2/11, patient was found to have some trouble finding her words but was able to respond. Patient was evaluated by neurology and recommended to transfer to [facility] for stroke workup due to expressive aphasia, sudden onset.  Hospital Course:   Acute CVA (cerebral infarction) with sudden onset expressive aphasia: Slight improvement - MRI of the brain showed small left middle cerebral artery territory infarct, left frontal lobe - MRA showed no central large vessel occlusion, distal thromboembolic disease - 2-D echo showed EF of 55-60%, normal wall motion, mild to moderate aortic stenosis, mild regurgitation, left bundle branch block - Initial CT angiogram head was negative for CVA, extensive atherosclerotic calcification in the aortic arch with 50% stenosis in proximal left common carotid artery. - Carotid Dopplers showed 1-39% ICA stenosis - Lipid panel showed LDL 48  - Was on aspirin, however due to history of atrial fibrillation, cardiology recommended anticoagulation. She had prior GI bleeding with supratherapeutic INR, fall risk. Given new CVA and atrial fibrillation, cardiology, Dr. Wynonia Summers recommended starting eliquis, neurology, Dr. Leonie Summers agreed. -PT, OT, ST evaluation -> CIR vs SNF. Patient's family requested skilled nursing facility to continue with the rehabilitation.    UTI - Urine culture showed no growth, patient received IV RocephinDuring hospitalization .   Sepsis/ SIRS secondary to UTI resolved   Right distal fibular fracture - Appreciate orthopedics recommendations, PT evaluation recommended skilled nursing  facility  -  continue physical therapy and patient will need follow-up appointment in 2 weeks with Dr. Alvan Summers, will need repeat x-rays.   Paroxysmal atrial fibrillation - Not on anticoagulation because of history of bleeding, was on daily aspirin prior to admission. - Continue metoprolol for rate control, started on eliquis per radiology and neurology recommendations, and new CVA. Hopefully patient's fall risk will significantly be lowered with supervision at skilled nursing facility.    coronary artery disease, status post CABG / troponin elevation - Troponin mildly elevated on admission, 0.04. Patient did not have complaints of chest pain. Likely demand ischemia from UTI, fall and fibular fracture.  - Cardiology was consulted, patient underwent 2-D echo, EF 55-60%, normal wall motion    Diabetes mellitus with  peripheral artery disease uncontrolled - A1c on this admission is 10.4 indicating poor glycemic control. - Increased NPH insulin 38 units twice daily along with sliding scale insulin.   Essential hypertension - Continue Norvasc, losartan and metoprolol  Acute renal failure -  creatinine 1.42 at the time of admission, Lasix and Cozaar were placed on hold. Creatinine function has completely normalized to 0.76 at the time of discharge. Patient can restart Lasix at half dose at 40 mg daily (was twas taking 80 mg daily prior to admission), decrease Cozaar to 50 mg daily   Rhabdomyolysis - CK level 994 on the admission, improved, renal function improved, patient received gentle hydration on admission.  Anemia of chronic disease - Currently stable  Day of Discharge BP 159/61 mmHg  Pulse 84  Temp(Src) 98 F (36.7 C) (Oral)  Resp 20  Ht 5\' 6"  (1.676 m)  Wt 89.359 kg (197 lb)  BMI 31.81 kg/m2  SpO2 99%  Physical Exam: General: Alert and awake oriented x3 not in any acute distress. HEENT: anicteric sclera, pupils reactive to light and accommodation CVS: S1-S2 clear no murmur  rubs or gallops Chest: clear to auscultation bilaterally, no wheezing rales or rhonchi Abdomen: soft nontender, nondistended, normal bowel sounds Extremities: no cyanosis, clubbing or edema noted bilaterally, right leg dressing intact Neuro: Cranial nerves II-XII intact, significant dysarthria, mild right grip weakness, right lower extremities not tested due to ankle fracture   The results of significant diagnostics from this hospitalization (including imaging, microbiology, ancillary and laboratory) are listed below for reference.    LAB RESULTS: Basic Metabolic Panel:  Recent Labs Lab 06/02/14 0502 06/03/14 0551  NA 136 136  K 3.7 4.1  CL 106 105  CO2 24 26  GLUCOSE 94 91  BUN 10 10  CREATININE 0.68 0.76  CALCIUM 8.4 8.6   Liver Function Tests:  Recent Labs Lab 05/29/14 1110 05/30/14 0254  AST 103* 104*  ALT 59* 64*  ALKPHOS 131* 106  BILITOT 1.1 0.9  PROT 7.7 6.2  ALBUMIN 2.9* 2.3*   No results for input(s): LIPASE, AMYLASE in the last 168 hours. No results for input(s): AMMONIA in the last 168 hours. CBC:  Recent Labs Lab 05/29/14 1110  06/02/14 0502 06/03/14 0551  WBC 16.4*  < > 9.9 10.7*  NEUTROABS 13.8*  --   --   --   HGB 13.6  < > 12.1 12.6  HCT 38.8  < > 36.1 37.4  MCV 91.9  < > 94.5 93.0  PLT 254  < > 326 361  < > = values in this interval not displayed. Cardiac Enzymes:  Recent Labs Lab 05/29/14 2108 05/30/14 0253 05/30/14 0254 05/30/14 1204  CKTOTAL  --   --  454* 289*  TROPONINI 0.05* 0.06*  --   --    BNP: Invalid input(s): POCBNP CBG:  Recent Labs Lab 06/02/14 2043 06/03/14 0653  GLUCAP 251* 101*    Significant Diagnostic Studies:  Ct Angio Head W/cm &/or Wo Cm  05/30/2014   CLINICAL DATA:  Aphasia and weakness.  Code stroke.  EXAM: CT ANGIOGRAPHY HEAD AND NECK  TECHNIQUE: Multidetector CT imaging of the head and neck was performed using the standard protocol during bolus administration of intravenous contrast. Multiplanar  CT image reconstructions and MIPs were obtained to evaluate the vascular anatomy. Carotid stenosis measurements (when applicable) are obtained utilizing NASCET criteria, using the distal internal carotid diameter as the denominator.  CONTRAST:  176mL OMNIPAQUE IOHEXOL 350 MG/ML SOLN  COMPARISON:  None.  FINDINGS: CT HEAD  Brain: There is mild generalized cerebral atrophy, within normal limits for age. There is no evidence of acute cortical infarct, intracranial hemorrhage, mass, midline shift, or extra-axial fluid collection. Periventricular white-matter hypodensities are nonspecific but compatible with mild chronic small vessel ischemic disease. Small foci of well defined low density in the parietal lobes may reflect dilated perivascular spaces or less likely chronic lacunar infarcts.  Calvarium and skull base: No skull fracture or aggressive osseous lesions identified.  Paranasal sinuses: Visualized paranasal sinuses and mastoid air cells are clear.  Orbits: Prior bilateral cataract extraction.  CTA NECK  Aortic arch: 3 vessel aortic arch with extensive atherosclerotic calcification. There is mild stenosis of the proximal brachiocephalic artery. There is heavy calcification throughout the proximal left subclavian artery with mild to moderate stenosis.  Right carotid system: Proximal common carotid artery is partially obscured by streak artifact. There is moderate calcification at the carotid bifurcation without significant common or internal carotid artery stenosis identified. There is mild narrowing of the proximal ECA.  Left carotid system: There is approximately 50% stenosis of the common carotid artery at its origin. There is moderate calcification in the mid to distal common carotid artery extending into the proximal ICA. No significant distal common carotid or internal carotid artery stenosis is present.  Vertebral arteries: Vertebral arteries are patent with the left being mildly dominant. No vertebral  artery stenosis is identified.  Skeleton: Sequelae of prior median sternotomy are partially visualized. Advanced multilevel cervical disc degeneration is noted, worst at C3-4 where there is likely moderate spinal stenosis and severe bilateral neural foraminal stenosis.  Other neck: An 8 mm right thyroid nodule is incidentally noted. Biapical lung scarring is noted. Left submandibular gland appears to be absent.  CTA HEAD  Anterior circulation: Internal carotid arteries are patent from skullbase to carotid termini. There is moderate carotid siphon calcification bilaterally resulting in mild bilateral cavernous and supraclinoid carotid stenosis, estimated at approximately 50%. 1.5-2 mm outpouching from the distal right supraclinoid ICA in the posterior communicating artery region likely represents an infundibulum. ACAs and MCAs are unremarkable.  Posterior circulation: Intracranial vertebral arteries are patent to the basilar with the left being dominant. There is mild bilateral vertebral artery calcification without significant stenosis. Right PICA origin is patent. Left PICA origin is not clearly identified, although it may share a common origin with the left AICA. Bilateral AICA and SCA origins are patent. Basilar artery is patent without stenosis. PCAs are unremarkable.  Venous sinuses: Patent.  Anatomic variants: None.  Delayed phase: Not performed at request of attending neurologist.  IMPRESSION: 1. No evidence of acute intracranial abnormality. 2. Mild chronic small vessel ischemic disease. 3. No evidence of major intracranial arterial occlusion. Prominent intracranial ICA calcification with mild stenosis bilaterally. 4. Extensive atherosclerotic calcification in the aortic arch with 50% stenosis of the proximal left common carotid artery. 5. Moderate bilateral carotid bifurcation atherosclerosis without significant cervical ICA stenosis. 6. Pain vertebral arteries without stenosis.  Preliminary results were  called by telephone at the time of interpretation on 05/30/2014 at 11:25 am to Dr. Leonel Ramsay, who verbally acknowledged these results.   Electronically Signed   By: Logan Bores   On: 05/30/2014 12:12   Dg Ankle Complete Right  05/29/2014   CLINICAL DATA:  Fall in bathroom with right ankle pain. Initial encounter  EXAM: RIGHT ANKLE - COMPLETE 3+ VIEW  COMPARISON:  None.  FINDINGS: Nondisplaced oblique fracture of the distal fibula, above the ankle joint.  There is no medial malleolus fracture or ankle mortise widening.  Osteopenia.  IMPRESSION: Nondisplaced distal fibula fracture.   Electronically Signed   By: Monte Fantasia M.D.   On: 05/29/2014 11:52   Ct Angio Neck W/cm &/or Wo/cm  05/30/2014   CLINICAL DATA:  Aphasia and weakness.  Code stroke.  EXAM: CT ANGIOGRAPHY HEAD AND NECK  TECHNIQUE: Multidetector CT imaging of the head and neck was performed using the standard protocol during bolus administration of intravenous contrast. Multiplanar CT image reconstructions and MIPs were obtained to evaluate the vascular anatomy. Carotid stenosis measurements (when applicable) are obtained utilizing NASCET criteria, using the distal internal carotid diameter as the denominator.  CONTRAST:  152mL OMNIPAQUE IOHEXOL 350 MG/ML SOLN  COMPARISON:  None.  FINDINGS: CT HEAD  Brain: There is mild generalized cerebral atrophy, within normal limits for age. There is no evidence of acute cortical infarct, intracranial hemorrhage, mass, midline shift, or extra-axial fluid collection. Periventricular white-matter hypodensities are nonspecific but compatible with mild chronic small vessel ischemic disease. Small foci of well defined low density in the parietal lobes may reflect dilated perivascular spaces or less likely chronic lacunar infarcts.  Calvarium and skull base: No skull fracture or aggressive osseous lesions identified.  Paranasal sinuses: Visualized paranasal sinuses and mastoid air cells are clear.  Orbits: Prior  bilateral cataract extraction.  CTA NECK  Aortic arch: 3 vessel aortic arch with extensive atherosclerotic calcification. There is mild stenosis of the proximal brachiocephalic artery. There is heavy calcification throughout the proximal left subclavian artery with mild to moderate stenosis.  Right carotid system: Proximal common carotid artery is partially obscured by streak artifact. There is moderate calcification at the carotid bifurcation without significant common or internal carotid artery stenosis identified. There is mild narrowing of the proximal ECA.  Left carotid system: There is approximately 50% stenosis of the common carotid artery at its origin. There is moderate calcification in the mid to distal common carotid artery extending into the proximal ICA. No significant distal common carotid or internal carotid artery stenosis is present.  Vertebral arteries: Vertebral arteries are patent with the left being mildly dominant. No vertebral artery stenosis is identified.  Skeleton: Sequelae of prior median sternotomy are partially visualized. Advanced multilevel cervical disc degeneration is noted, worst at C3-4 where there is likely moderate spinal stenosis and severe bilateral neural foraminal stenosis.  Other neck: An 8 mm right thyroid nodule is incidentally noted. Biapical lung scarring is noted. Left submandibular gland appears to be absent.  CTA HEAD  Anterior circulation: Internal carotid arteries are patent from skullbase to carotid termini. There is moderate carotid siphon calcification bilaterally resulting in mild bilateral cavernous and supraclinoid carotid stenosis, estimated at approximately 50%. 1.5-2 mm outpouching from the distal right supraclinoid ICA in the posterior communicating artery region likely represents an infundibulum. ACAs and MCAs are unremarkable.  Posterior circulation: Intracranial vertebral arteries are patent to the basilar with the left being dominant. There is mild  bilateral vertebral artery calcification without significant stenosis. Right PICA origin is patent. Left PICA origin is not clearly identified, although it may share a common origin with the left AICA. Bilateral AICA and SCA origins are patent. Basilar artery is patent without stenosis. PCAs are unremarkable.  Venous sinuses: Patent.  Anatomic variants: None.  Delayed phase: Not performed at request of attending neurologist.  IMPRESSION: 1. No evidence of acute intracranial abnormality. 2. Mild chronic small vessel ischemic disease. 3. No evidence of major intracranial arterial occlusion. Prominent intracranial  ICA calcification with mild stenosis bilaterally. 4. Extensive atherosclerotic calcification in the aortic arch with 50% stenosis of the proximal left common carotid artery. 5. Moderate bilateral carotid bifurcation atherosclerosis without significant cervical ICA stenosis. 6. Pain vertebral arteries without stenosis.  Preliminary results were called by telephone at the time of interpretation on 05/30/2014 at 11:25 am to Dr. Leonel Ramsay, who verbally acknowledged these results.   Electronically Signed   By: Logan Bores   On: 05/30/2014 12:12   Mr Brain Wo Contrast  05/30/2014   CLINICAL DATA:  Status post fall with ankle fracture, altered mental status while in the hospital, difficulty speaking. History of atrial fibrillation, status post ablation. History of diabetes, hypertension, hyperlipidemia, carotid artery occlusion.  EXAM: MRI HEAD WITHOUT CONTRAST  MRA HEAD WITHOUT CONTRAST  TECHNIQUE: Multiplanar, multiecho pulse sequences of the brain and surrounding structures were obtained without intravenous contrast. Angiographic images of the head were obtained using MRA technique without contrast.  COMPARISON:  CT of the head May 30, 2014 at 11:08 a.m.  FINDINGS: MRI HEAD FINDINGS  Mildly motion degraded examination.  3.9 x 1.9 cm area reduced diffusion within LEFT frontal lobe extending to the  cortex, corresponding low ADC values. Very minimal T2 hyperintense signal. No susceptibility artifact to suggest hemorrhage.  Ventricles and sulci are overall normal for patient's age. No midline shift, mass effect or mass lesions. Mild white matter changes can be seen with chronic small vessel ischemic disease, less than expected for age.  No abnormal extra-axial fluid collections. Status post bilateral ocular lens implants. Paranasal sinuses and mastoid air cells appear well-aerated. Patient is edentulous. No abnormal sellar expansion. Cerebellar tonsils at but not below the foramen magnum. No suspicious calvarial bone marrow signal.  MRA HEAD FINDINGS  Moderately motion degraded examination.  Anterior circulation: Normal flow related enhancement of the included cervical, petrous, cavernous and supra clinoid internal carotid arteries. Patent anterior communicating artery. Normal flow related enhancement of the anterior and middle cerebral arteries, limited assessment of distal segments due to motion.  No large vessel occlusion, central high-grade stenosis.  Posterior circulation: LEFT vertebral artery is dominant. Basilar artery is patent, with normal flow related enhancement of the main branch vessels. Normal flow related enhancement of the posterior cerebral arteries.  No large vessel occlusion, central high-grade stenosis.  IMPRESSION: MRI HEAD: Acute small LEFT middle cerebral artery territory infarct (LEFT frontal lobe).  Otherwise normal MRI of the brain for age.  MRA HEAD: Moderately motion degraded examination without central large vessel occlusion. Distal thromboembolic disease would not be appreciated on this motion degraded examination.  These results will be called to the ordering clinician or representative by the Radiologist Assistant, and communication documented in the PACS or zVision Dashboard.   Electronically Signed   By: Elon Alas   On: 05/30/2014 22:52   Mr Jodene Nam Headm  05/30/2014    CLINICAL DATA:  Status post fall with ankle fracture, altered mental status while in the hospital, difficulty speaking. History of atrial fibrillation, status post ablation. History of diabetes, hypertension, hyperlipidemia, carotid artery occlusion.  EXAM: MRI HEAD WITHOUT CONTRAST  MRA HEAD WITHOUT CONTRAST  TECHNIQUE: Multiplanar, multiecho pulse sequences of the brain and surrounding structures were obtained without intravenous contrast. Angiographic images of the head were obtained using MRA technique without contrast.  COMPARISON:  CT of the head May 30, 2014 at 11:08 a.m.  FINDINGS: MRI HEAD FINDINGS  Mildly motion degraded examination.  3.9 x 1.9 cm area reduced diffusion within LEFT frontal  lobe extending to the cortex, corresponding low ADC values. Very minimal T2 hyperintense signal. No susceptibility artifact to suggest hemorrhage.  Ventricles and sulci are overall normal for patient's age. No midline shift, mass effect or mass lesions. Mild white matter changes can be seen with chronic small vessel ischemic disease, less than expected for age.  No abnormal extra-axial fluid collections. Status post bilateral ocular lens implants. Paranasal sinuses and mastoid air cells appear well-aerated. Patient is edentulous. No abnormal sellar expansion. Cerebellar tonsils at but not below the foramen magnum. No suspicious calvarial bone marrow signal.  MRA HEAD FINDINGS  Moderately motion degraded examination.  Anterior circulation: Normal flow related enhancement of the included cervical, petrous, cavernous and supra clinoid internal carotid arteries. Patent anterior communicating artery. Normal flow related enhancement of the anterior and middle cerebral arteries, limited assessment of distal segments due to motion.  No large vessel occlusion, central high-grade stenosis.  Posterior circulation: LEFT vertebral artery is dominant. Basilar artery is patent, with normal flow related enhancement of the main  branch vessels. Normal flow related enhancement of the posterior cerebral arteries.  No large vessel occlusion, central high-grade stenosis.  IMPRESSION: MRI HEAD: Acute small LEFT middle cerebral artery territory infarct (LEFT frontal lobe).  Otherwise normal MRI of the brain for age.  MRA HEAD: Moderately motion degraded examination without central large vessel occlusion. Distal thromboembolic disease would not be appreciated on this motion degraded examination.  These results will be called to the ordering clinician or representative by the Radiologist Assistant, and communication documented in the PACS or zVision Dashboard.   Electronically Signed   By: Elon Alas   On: 05/30/2014 22:52   Dg Hip Unilat With Pelvis 2-3 Views Right  05/29/2014   CLINICAL DATA:  Fall in bathroom with right hip and ankle pain. Initial encounter  EXAM: RIGHT HIP (WITH PELVIS) 2-3 VIEWS  COMPARISON:  None.  FINDINGS: When accounting for marginal spurring, there is no evidence of femoral neck or other hip fracture. The hips are located. There is mild degenerative spurring about both hips without asymmetric joint narrowing. No evidence of pelvic ring fracture or diastasis.  IMPRESSION: No acute osseous findings.   Electronically Signed   By: Monte Fantasia M.D.   On: 05/29/2014 11:54    2D ECHO: Study Conclusions  - Left ventricle: The cavity size was normal. There was mild concentric hypertrophy. Systolic function was normal. The estimated ejection fraction was in the range of 55% to 60%. Wall motion was normal; there were no regional wall motion abnormalities. - Ventricular septum: Septal motion showed abnormal function and dyssynergy. These changes are consistent with a left bundle branch block. - Aortic valve: Valve mobility was moderately restricted. There was mild to moderate stenosis. There was mild regurgitation. Valve area (VTI): 1.31 cm^2. Valve area (Vmax): 1.56 cm^2. - Mitral  valve: Mildly calcified annulus. There was mild regurgitation. - Left atrium: The atrium was moderately to severely dilated.   Disposition and Follow-up: Discharge Instructions    Diet Carb Modified    Complete by:  As directed      Increase activity slowly    Complete by:  As directed             DISPOSITION: SNF   DISCHARGE FOLLOW-UP Follow-up Information    Follow up with Mauri Pole, MD In 2 weeks.   Specialty:  Orthopedic Surgery   Why:  follow up ankle fracture   Contact information:   [address]  [place] [address] [phone]       Follow up with SETHI,PRAMOD, MD. Schedule an appointment as soon as possible for a visit in 1 month.   Specialties:  Neurology, Radiology   Why:  for hospital follow-up/stroke   Contact information:   [address] [place] [address] [phone]       Follow up with Marlou Sa, ERIC, MD. Schedule an appointment as soon as possible for a visit in 2 weeks.   Specialty:  Internal Medicine   Why:  for hospital follow-up   Contact information:   [address] [place] [address] [phone]        Time spent on Discharge: 45 mins  Signed:   Evalisse Prajapati M.D. [facility] 06/03/2014, 11:15 AM Pager: [phone]

## 2014-06-03 NOTE — Progress Notes (Signed)
Physical Therapy Treatment Patient Details Name: Kristina Summers MRN: [id number] DOB: 12-12-1935 Today's Date: 06/03/2014    History of Present Illness 79 yo female with onset of  fall with R non dislpaced fibular fracture (managed non-surgically); 2/11 developed difficulty speaking and R hemiplegia, MRI + Lt MCA CVA  PMHx: legally blind, osteopenia, CAD, obesity, a-fib, HTN, DM, HLD, PVD, GI bleed, angina, GERD, fibromyalgia, MI, UTI    PT Comments    Pt very motivated and surprisingly is able to maintain RLE NWB despite Rt sided weakness. Doing well with pre-gait activities, however only able to "shimmy" when trying to advance LLE. Anticipate will use wheelchair as primary means of locomotion, however hopeful she will advance to walking short distances.    Follow Up Recommendations  CIR     Equipment Recommendations  None recommended by PT (until after inpt therapy)    Recommendations for Other Services       Precautions / Restrictions Precautions Precautions: Fall Required Braces or Orthoses: Other Brace/Splint (R ankle splint with ace wrap) Restrictions Weight Bearing Restrictions: Yes RLE Weight Bearing: Non weight bearing    Mobility  Bed Mobility                  Transfers Overall transfer level: Needs assistance Equipment used: Rolling walker (2 wheeled) Transfers: Sit to/from Stand Sit to Stand: Mod assist;+2 physical assistance         General transfer comment: Stood x 2 (on second attempt with 1 person assist); vc for sequencing with RW; pt able to maintain NWB RLE; assist to control descent  Ambulation/Gait Ambulation/Gait assistance: Min assist;+2 physical assistance;+2 safety/equipment Ambulation Distance (Feet): 1 Feet Assistive device: Rolling walker (2 wheeled)       General Gait Details: pre-gait able to lift Lt toes and then Lt heel while maintaining RLE NWB; attempted hop/step with LLE and pt only able to "shimmy" on her Lt foot;     Stairs            Wheelchair Mobility    Modified Rankin (Stroke Patients Only) Modified Rankin (Stroke Patients Only) Pre-Morbid Rankin Score: Slight disability Modified Rankin: Moderately severe disability     Balance           Standing balance support: Bilateral upper extremity supported Standing balance-Leahy Scale: Poor Standing balance comment: able to stand up to 3 minutes with vc for upright posture                    Cognition Arousal/Alertness: Awake/alert Behavior During Therapy: WFL for tasks assessed/performed Overall Cognitive Status: Within Functional Limits for tasks assessed                      Exercises General Exercises - Lower Extremity Hip Flexion/Marching: AAROM;Right;5 reps;Seated;Standing Toe Raises: AROM;Left;10 reps;Standing Heel Raises: AROM;Left;10 reps;Standing    General Comments        Pertinent Vitals/Pain Pain Assessment: Faces Faces Pain Scale: Hurts little more Pain Location: RLE Pain Intervention(s): Limited activity within patient's tolerance;Monitored during session;Repositioned    Home Living                      Prior Function            PT Goals (current goals can now be found in the care plan section) Acute Rehab PT Goals Patient Stated Goal: wants to get stronger Progress towards PT goals: Progressing toward goals    Frequency  Min  4X/week    PT Plan Current plan remains appropriate    Co-evaluation             End of Session Equipment Utilized During Treatment: Gait belt Activity Tolerance: Patient tolerated treatment well Patient left: with call bell/phone within reach;in chair;with chair alarm set     Time: [phone] PT Time Calculation (min) (ACUTE ONLY): 23 min  Charges:  $Gait Training: 8-22 mins $Therapeutic Activity: 8-22 mins                    G Codes:      Kristina Summers 2014-06-16, 9:36 AM Pager [phone]

## 2014-06-03 NOTE — Progress Notes (Signed)
Patient is discharged form room 4N22 at this time. Alert and in stable condition. IV site d/c'd as well as tele. Report given to nurse Kathlee Nations at [facility]. Transported out tf unit via stretcher with all belongings at side.

## 2014-06-06 ENCOUNTER — Non-Acute Institutional Stay (SKILLED_NURSING_FACILITY): Payer: Medicare Other | Admitting: Adult Health

## 2014-06-06 DIAGNOSIS — K5900 Constipation, unspecified: Secondary | ICD-10-CM

## 2014-06-06 DIAGNOSIS — I739 Peripheral vascular disease, unspecified: Secondary | ICD-10-CM

## 2014-06-06 DIAGNOSIS — I4891 Unspecified atrial fibrillation: Secondary | ICD-10-CM

## 2014-06-06 DIAGNOSIS — S82491A Other fracture of shaft of right fibula, initial encounter for closed fracture: Secondary | ICD-10-CM

## 2014-06-06 DIAGNOSIS — E1159 Type 2 diabetes mellitus with other circulatory complications: Secondary | ICD-10-CM | POA: Diagnosis not present

## 2014-06-06 DIAGNOSIS — S82831A Other fracture of upper and lower end of right fibula, initial encounter for closed fracture: Secondary | ICD-10-CM

## 2014-06-06 DIAGNOSIS — E1151 Type 2 diabetes mellitus with diabetic peripheral angiopathy without gangrene: Secondary | ICD-10-CM

## 2014-06-06 DIAGNOSIS — I10 Essential (primary) hypertension: Secondary | ICD-10-CM

## 2014-06-06 DIAGNOSIS — I6340 Cerebral infarction due to embolism of unspecified cerebral artery: Secondary | ICD-10-CM

## 2014-06-06 DIAGNOSIS — E785 Hyperlipidemia, unspecified: Secondary | ICD-10-CM

## 2014-06-07 ENCOUNTER — Non-Acute Institutional Stay (SKILLED_NURSING_FACILITY): Payer: Medicare Other | Admitting: Internal Medicine

## 2014-06-07 DIAGNOSIS — S82491A Other fracture of shaft of right fibula, initial encounter for closed fracture: Secondary | ICD-10-CM

## 2014-06-07 DIAGNOSIS — K5900 Constipation, unspecified: Secondary | ICD-10-CM | POA: Diagnosis not present

## 2014-06-07 DIAGNOSIS — I639 Cerebral infarction, unspecified: Secondary | ICD-10-CM | POA: Diagnosis not present

## 2014-06-07 DIAGNOSIS — E785 Hyperlipidemia, unspecified: Secondary | ICD-10-CM | POA: Diagnosis not present

## 2014-06-07 DIAGNOSIS — E1159 Type 2 diabetes mellitus with other circulatory complications: Secondary | ICD-10-CM

## 2014-06-07 DIAGNOSIS — I25810 Atherosclerosis of coronary artery bypass graft(s) without angina pectoris: Secondary | ICD-10-CM

## 2014-06-07 DIAGNOSIS — I739 Peripheral vascular disease, unspecified: Secondary | ICD-10-CM

## 2014-06-07 DIAGNOSIS — I4891 Unspecified atrial fibrillation: Secondary | ICD-10-CM | POA: Diagnosis not present

## 2014-06-07 DIAGNOSIS — S82831A Other fracture of upper and lower end of right fibula, initial encounter for closed fracture: Secondary | ICD-10-CM

## 2014-06-07 DIAGNOSIS — E1151 Type 2 diabetes mellitus with diabetic peripheral angiopathy without gangrene: Secondary | ICD-10-CM

## 2014-06-07 NOTE — Progress Notes (Signed)
Patient ID: Kristina Summers, female   DOB: Feb 13, 1936, 79 y.o.   MRN: [id number]     [facility]   PCP: Marlou Sa, ERIC, MD  Code Status: DNR  Allergies  Allergen Reactions  . Morphine And Related Nausea And Vomiting    Chest pain    . Lisinopril Cough  . Sertraline Hcl     REACTION: nausea  . Zoloft [Sertraline Hcl] Other (See Comments)    Nausea     Chief Complaint  Patient presents with  . New Admit To SNF     HPI:  79 year old patient is here for short term rehabilitation post hospital admission from 05/29/14-06/03/14 post fall with right fibular fracture and UTI. Orthopedics was consulted and she had splint placed. She was started on antibiotics. On 05/30/14 she had word finding difficulty and underwent stroke evaluation. MRI of the brain showed small left middle cerebral artery territory infarct, left frontal lobe. She was started on eliquis with history of afib and new stroke. She has hx of falls and gi bleed but both cardiology and neurology were consulted on this and decision made. She has past medical history of poorly controlled diabetes, coronary artery disease status post CABG, atrial fibrillation. She is seen in her room today. She denies any concerns this visit. Her pain is under control.   Review of Systems:  Constitutional: Negative for fever, chills, diaphoresis.  HENT: Negative for headache, congestion Eyes: Negative for eye pain, blurred vision, double vision and discharge.  Respiratory: Negative for cough, shortness of breath and wheezing.   Cardiovascular: Negative for chest pain, palpitations, leg swelling.  Gastrointestinal: Negative for heartburn, nausea, vomiting, abdominal pain. Last bowel movement 4 days ago. Passing flatus and appetite is fair. Genitourinary: Negative for dysuria, flank pain.  Musculoskeletal: Negative for back pain, falls Skin: Negative for itching, rash.  Neurological: positive for weakness. Negative for  dizziness, tingling, focal weakness Psychiatric/Behavioral: Negative for depression, anxiety, insomnia and memory loss.    Past Medical History  Diagnosis Date  . CAD (coronary artery disease)   . Obesity   . Atrial fibrillation   . Hypertension   . Diabetes mellitus   . Hyperlipidemia   . Peripheral vascular disease   . GI bleed 1/11  . Carotid artery occlusion   . Normal nuclear stress test 03/18/2011  . Angina   . GERD (gastroesophageal reflux disease)   . Arthritis   . Fibromyalgia   . Myocardial infarction ~ 1991  . UTI (lower urinary tract infection) Sept. 2015   Past Surgical History  Procedure Laterality Date  . Trigger finger release  1996    right thumb  . Cataract extraction w/ intraocular lens  implant, bilateral  2004-2005  . Coronary artery bypass graft  2000    CABG X5  . Peripheral arterial stent graft  04/01/11    right common iliac  . Angioplasty  06/17/11    Left leg common femoral artery cannulation under u/s Left leg runoff  . Eye surgery    . Abdominal aortagram N/A 04/01/2011    Procedure: ABDOMINAL AORTAGRAM;  Surgeon: Conrad Little York, MD;  Location[facility] C[facility] LAB;  Service: Cardiovascular;  Laterality: N/A;  . Percutaneous stent intervention Right 04/01/2011    Procedure: PERCUTANEOUS STENT INTERVENTION;  Surgeon: Conrad Rio, MD;  Location: [facility] CATH LAB;  Service: Cardiovascular;  Laterality: Right;  rt iliac stent  . Lower extremity angiogram Bilateral 04/01/2011    Procedure: LOWER EXTREMITY ANGIOGRAM;  Surgeon: Conrad Hallam, MD;  Location: [facility] CATH LAB;  Service: Cardiovascular;  Laterality: Bilateral;  . Lower extremity angiogram Left 06/17/2011    Procedure: LOWER EXTREMITY ANGIOGRAM;  Surgeon: Conrad Fairfield, MD;  Location: [facility] CATH LAB;  Service: Cardiovascular;  Laterality: Left;  . Lower extremity angiogram N/A 11/18/2011    Procedure: LOWER EXTREMITY ANGIOGRAM;  Surgeon: Conrad Canterwood, MD;  Location: [facility] CATH LAB;  Service: Cardiovascular;  Laterality:  N/A;   Social History:   reports that she quit smoking about 24 years ago. Her smoking use included Cigarettes. She has a 60 pack-year smoking history. She has never used smokeless tobacco. She reports that she does not drink alcohol or use illicit drugs.  Family History  Problem Relation Age of Onset  . Other Brother     intestinal blockage  . Diabetes Brother     Medications: Medication reviewed. See MAR   Physical Exam: Filed Vitals:   06/07/14 1208  BP: 133/66  Pulse: 79  Temp: 98 F (36.7 C)  Resp: 18  SpO2: 98%    General- elderly female, obese, in no acute distress Head- normocephalic, atraumatic Throat- moist mucus membrane Eyes- PERRLA, EOMI, no pallor, no icterus, no discharge, normal conjunctiva, normal sclera Neck- no cervical lymphadenopathy Cardiovascular- normal s1,s2, no murmurs, palpable radial pulses, leg edema Respiratory- bilateral clear to auscultation, no wheeze, no rhonchi, no crackles, no use of accessory muscles Abdomen- bowel sounds present, soft, non tender Musculoskeletal- able to move all 4 extremities, right sided weakness, right leg in splint, left good dorsalis pedis Neurological- can comprehend but has dysarthria Skin- warm and dry Psychiatry- alert and oriented     Labs reviewed: Basic Metabolic Panel:  Recent Labs  06/01/14 0455 06/02/14 0502 06/03/14 0551  NA 135 136 136  K 3.5 3.7 4.1  CL 103 106 105  CO2 26 24 26   GLUCOSE 107* 94 91  BUN 15 10 10   CREATININE 0.72 0.68 0.76  CALCIUM 8.1* 8.4 8.6   Liver Function Tests:  Recent Labs  04/17/14 2307 05/29/14 1110 05/30/14 0254  AST 36 103* 104*  ALT 30 59* 64*  ALKPHOS 109 131* 106  BILITOT 0.6 1.1 0.9  PROT 7.6 7.7 6.2  ALBUMIN 3.2* 2.9* 2.3*   No results for input(s): LIPASE, AMYLASE in the last 8760 hours. No results for input(s): AMMONIA in the last 8760 hours. CBC:  Recent Labs  08/23/13 1417  04/17/14 2307 05/29/14 1110  06/01/14 0455  06/02/14 0502 06/03/14 0551  WBC 7.6  < > 8.6 16.4*  < > 10.1 9.9 10.7*  NEUTROABS 4.9  --  6.7 13.8*  --   --   --   --   HGB 13.9  < > 13.3 13.6  < > 12.0 12.1 12.6  HCT 39.6  < > 38.6 38.8  < > 35.9* 36.1 37.4  MCV 93.0  < > 91.9 91.9  < > 94.2 94.5 93.0  PLT 234  < > 239 254  < > 299 326 361  < > = values in this interval not displayed. Cardiac Enzymes:  Recent Labs  05/29/14 1110 05/29/14 1548 05/29/14 2108 05/30/14 0253 05/30/14 0254 05/30/14 1204  CKTOTAL 994*  --   --   --  454* 289*  TROPONINI 0.04* 0.04* 0.05* 0.06*  --   --    BNP: Invalid input(s): POCBNP CBG:  Recent Labs  06/02/14 2043 06/03/14 0653 06/03/14 1141  GLUCAP 251* 101* 263*  Assessment/Plan  Acute CVA Will have pt work with physical therapy and occupational therapy team to help with gait training and muscle strengthening exercises.fall precautions. Skin care. Encourage to be out of bed. Continue Eliquis 5 mg bid, has f/u with neurology  Right distal femur fracture S/p splinting, to work with therapy team. Has f/u with dr [name]. Continue OxyIR 5 mg q6h prn for pain  Constipation Add senna-s 2 tab daily for now. Continue prn dulcolax suppository  Atrial fibrillation rate controlled. continue metoprolol 25 mg bid for rate control and eliquis 5 mg bid for stroke prophylaxis  Diabetes mellitus uncontrolled  Continue Humulin N 38 units am,  34 units units evening, monitor cbg  Hypertension Stable, continue Norvasc 5 mg daily, metoprolol 25 mg bbid, Lasix 40 mg daily and Cozaar 50 mg daily  CAD S/p CABG. Remains chest pain free. Continue b blocker, ARB  Hyperlipidemia continue Lipitor 80 mg daily  Goals of care: short term rehabilitation   Labs/tests ordered: bmp  Family/ staff Communication: reviewed care plan with patient and nursing supervisor    Blanchie Serve, MD  [facility] [phone] (Monday-Friday 8 am - 5 pm) [phone] (afterhours)

## 2014-06-28 ENCOUNTER — Other Ambulatory Visit: Payer: Self-pay | Admitting: *Deleted

## 2014-06-28 MED ORDER — OXYCODONE HCL 5 MG PO TABS: ORAL_TABLET | ORAL | Status: DC

## 2014-06-28 NOTE — Telephone Encounter (Signed)
[facility] 

## 2014-07-09 ENCOUNTER — Ambulatory Visit (INDEPENDENT_AMBULATORY_CARE_PROVIDER_SITE_OTHER): Payer: Medicare Other | Admitting: Neurology

## 2014-07-09 ENCOUNTER — Encounter: Payer: Self-pay | Admitting: Neurology

## 2014-07-09 VITALS — BP 136/74 | HR 86 | Temp 97.2°F | Ht 66.0 in

## 2014-07-09 DIAGNOSIS — I6340 Cerebral infarction due to embolism of unspecified cerebral artery: Secondary | ICD-10-CM | POA: Diagnosis not present

## 2014-07-09 DIAGNOSIS — I63412 Cerebral infarction due to embolism of left middle cerebral artery: Secondary | ICD-10-CM

## 2014-07-09 NOTE — Progress Notes (Signed)
[id number]    Provider:  [name] Referring Provider: Rogers Blocker, MD Primary Care Physician:  [name], MD  CC:  Stroke  HPI:  Kristina Summers is a 79 y.o. female here as a follow up. She was admitted 6 weeks ago for difficulty speaking. Kristina Summers is a 79 y.o. female with a history of afib s/p ablation not on [facility] procedures who presented following a fall with ankle fracture to the ED. She is a care facility. The morning she was brought to the ED, she was "spacey" when the nurse spoke with her  and seemed to have some trouble finding her words, but was able to respond. When her daughter saw her around 8am, she noticed she was off, but thought it was pain medication. She subsequently saw her again around 9:45 am and patient was still having difficulty speaking. MRI of the head revealed an acute, small left MCA infarct.  At [facility] hospital, NIHSS: 6(2 for questions, 1 for gaze preference, 2 for aphasia, 1 for dysarthria). Patient was not administered TPA secondary to delay in arrival. She was admitted for further evaluation and treatment.   She is feeling better. Her speech is improving, here with daughter and almost back to her baseline. Daughter agrees. She says she went to the emergency room for her ankle and while there they noticed aphasia. That is what led to the admission.  Reviewed notes, labs and imaging from outside physicians, which showed:  Mr Brain Wo Contrast 05/30/2014  MRI HEAD:  Acute small LEFT middle cerebral artery territory infarct (LEFT frontal lobe). Otherwise normal MRI of the brain for age.  MRA HEAD:  Moderately motion degraded examination without central large vessel occlusion. Distal thromboembolic disease would not be appreciated on this motion degraded examination.    Carotid Doppler There is 1-39% bilateral ICA stenosis. Vertebral artery flow is antegrade.    Review of Systems: Patient complains of symptoms per HPI as well as the  following symptoms:murmur, swelling in legs, memory loss. Pertinent negatives per HPI. All others negative.   History   Social History  . Marital Status: Divorced    Spouse Name: N/A  . Number of Children: 3  . Years of Education: 12   Occupational History  . Retired    Social History Main Topics  . Smoking status: Former Smoker -- 2.00 packs/day for 30 years    Types: Cigarettes    Quit date: 04/19/1990  . Smokeless tobacco: Never Used     Comment: stopped smoking cigarettes 1991  . Alcohol Use: No  . Drug Use: No  . Sexual Activity: No   Other Topics Concern  . Not on file   Social History Narrative   Divorced.  Native of [place].  Formerly worked as Education administrator person.   Caffeine use: drinks decaf tea and coffee       Family History  Problem Relation Age of Onset  . Other Brother     intestinal blockage  . Diabetes Brother     Past Medical History  Diagnosis Date  . CAD (coronary artery disease)   . Obesity   . Atrial fibrillation   . Hypertension   . Diabetes mellitus   . Hyperlipidemia   . Peripheral vascular disease   . GI bleed 1/11  . Carotid artery occlusion   . Normal nuclear stress test 03/18/2011  . Angina   . GERD (gastroesophageal reflux disease)   . Arthritis   . Fibromyalgia   .  Myocardial infarction ~ 1991  . UTI (lower urinary tract infection) Sept. 2015    Past Surgical History  Procedure Laterality Date  . Trigger finger release  1996    right thumb  . Cataract extraction w/ intraocular lens  implant, bilateral  2004-2005  . Coronary artery bypass graft  2000    CABG X5  . Peripheral arterial stent graft  04/01/11    right common iliac  . Angioplasty  06/17/11    Left leg common femoral artery cannulation under u/s Left leg runoff  . Eye surgery    . Abdominal aortagram N/A 04/01/2011    Procedure: ABDOMINAL AORTAGRAM;  Surgeon: Conrad [place], MD;  Location: [facility] CATH LAB;  Service: Cardiovascular;  Laterality: N/A;  .  Percutaneous stent intervention Right 04/01/2011    Procedure: PERCUTANEOUS STENT INTERVENTION;  Surgeon: Conrad Stafford Courthouse, MD;  Location: [facility] CATH LAB;  Service: Cardiovascular;  Laterality: Right;  rt iliac stent  . Lower extremity angiogram Bilateral 04/01/2011    Procedure: LOWER EXTREMITY ANGIOGRAM;  Surgeon: Conrad [place] Whittier, MD;  Location: [facility] CATH LAB;  Service: Cardiovascular;  Laterality: Bilateral;  . Lower extremity angiogram Left 06/17/2011    Procedure: LOWER EXTREMITY ANGIOGRAM;  Surgeon: Conrad Brookston, MD;  Location: [facility] CATH LAB;  Service: Cardiovascular;  Laterality: Left;  . Lower extremity angiogram N/A 11/18/2011    Procedure: LOWER EXTREMITY ANGIOGRAM;  Surgeon: Conrad Sewanee, MD;  Location: [facility] CATH LAB;  Service: Cardiovascular;  Laterality: N/A;    Current Outpatient Prescriptions  Medication Sig Dispense Refill  . acetaminophen (TYLENOL) 325 MG tablet Take 2 tablets (650 mg total) by mouth every 6 (six) hours as needed for mild pain (or Fever >/= 101). 60 tablet 1  . amLODipine (NORVASC) 5 MG tablet Take 1 tablet by mouth daily.    Marland Kitchen apixaban (ELIQUIS) 5 MG TABS tablet Take 1 tablet (5 mg total) by mouth 2 (two) times daily. 60 tablet 0  . atorvastatin (LIPITOR) 80 MG tablet Take 1 tablet (80 mg total) by mouth daily. 30 tablet 0  . docusate sodium (COLACE) 100 MG capsule Take 1 capsule (100 mg total) by mouth 2 (two) times daily. 10 capsule 0  . furosemide (LASIX) 40 MG tablet Take 1 tablet (40 mg total) by mouth daily. 30 tablet   . HUMULIN R 100 UNIT/ML injection Inject 5 Units into the skin at bedtime.     . insulin NPH Human (HUMULIN N,NOVOLIN N) 100 UNIT/ML injection Inject 0.38 mLs (38 Units total) into the skin 2 (two) times daily. 38 in am and 34 at night 10 mL 11  . losartan (COZAAR) 50 MG tablet Take 1 tablet (50 mg total) by mouth daily.    . metoprolol tartrate (LOPRESSOR) 25 MG tablet Take 1 tablet by mouth 2 (two) times daily.    . Multiple Vitamins-Minerals (ONE-A-DAY  50 PLUS PO) Take 1 tablet by mouth daily.    Marland Kitchen NITROSTAT 0.4 MG SL tablet Place 1 tablet under the tongue every 5 (five) minutes x 3 doses as needed. Chest pain  1  . polyethylene glycol (MIRALAX / GLYCOLAX) packet Take 17 g by mouth daily. 14 each 0  . oxyCODONE (OXY IR/ROXICODONE) 5 MG immediate release tablet Take one tablet by mouth every 6 hours as needed for severe pain (Patient not taking: Reported on 07/09/2014) 120 tablet 0   No current facility-administered medications for this visit.    Allergies as of 07/09/2014 - Review Complete 07/09/2014  Allergen Reaction Noted  . Morphine and related Nausea And Vomiting 11/18/2011  . Lisinopril Cough 03/04/2011  . Sertraline hcl  09/23/2008  . Zoloft [sertraline hcl] Other (See Comments) 11/27/2012    Vitals: BP 136/74 mmHg  Pulse 86  Temp(Src) 97.2 F (36.2 C)  Ht 5\' 6"  (1.676 m) Last Weight:  Wt Readings from Last 1 Encounters:  07/10/14 200 lb (90.719 kg)   Last Height:   Ht Readings from Last 1 Encounters:  07/10/14 5\' 6"  (1.676 m)    PHYSICAL EXAM Obese middle aged Caucasian lady not in distress. . Afebrile. Head is nontraumatic. Neck is supple without bruit. Cardiac exam no murmur or gallop. Lungs are clear to auscultation. Distal pulses are well felt except in RLE where bandage for recent fracture limits evaluation. Neurological Exam : Awake alert oriented 2. Mild expressive aphasia(improved since admission). Mild dysarthria. Extraocular movements are full range without nystagmus. VFF.  Fundi were not visualized. Mild right lower facial weakness. Tongue midline. 5/5 strength. Mild diminished fine finger movements on the right.  Normal sensation to LT. Deep tendon reflexes are symmetric. Plantars are downgoing.    ASSESSMENT/PLAN Ms. Kristina Summers is a 79 y.o. female with history of poorly controlled diabetes, coronary artery disease status post CABG, atrial fibrillation not on anticoagulation due to history of bleeding,  history a few falls presenting to [facility] following a fall with aphasia. She did not receive IV t-PA due to fall. She had a small left MCA frontal lobe infarct secondary to atrial fibrillation with resultant expressive aphasia that is much improved.    C/w lipitor, Eliquis for stroke prevention. Continue close follow up for other vascular risk factors such as diabetes and HTN.    LDL 48, goal < 70  Continue statin at discharge  HgbA1c 10.4, goal < 7.0, needs follow up with PCP, discussed   Former Cigarette smoker, quit smoking in 1992  Obesity, Body mass index is 31.81 kg/(m^2).   Coronary artery disease - s/p CABG w/ troponin elevation (non ischemic)  PAD  PVD  Other Active Problems  Mild rhabdomyolysis  Distal R fibular fracture  UTI, placed on Rocphin  Sepsis secondary to UTI with elevated Temperature  ARF  Anemia of chronic disease    Sarina Ill, MD  [facility] [address] [place], [place] [address]  Phone [phone] Fax [phone]  A total of 15 minutes was spent face-to-face with this patient. Over half this time was spent on counseling patient on the stroke diagnosis and different therapeutic options available.

## 2014-07-09 NOTE — Patient Instructions (Addendum)
Overall you are doing fairly well but I do want to suggest a few things today:   Remember to drink plenty of fluid, eat healthy meals and do not skip any meals. Try to eat protein with a every meal and eat a healthy snack such as fruit or nuts in between meals. Try to keep a regular sleep-wake schedule and try to exercise daily, particularly in the form of walking, 20-30 minutes a day, if you can.   As far as your medications are concerned, I would like to suggest: Continue current medications Need follow up with primary care for diabetes which is not controlled well.   I would like to see you back in 6 months, sooner if we need to. Please call us with any interim questions, concerns, problems, updates or refill requests.   Please also call us for any test results so we can go over those with you on the phone.  My clinical assistant and will answer any of your questions and relay your messages to me and also relay most of my messages to you.   Our phone number is [phone]. We also have an after hours call service for urgent matters and there is a physician on-call for urgent questions. For any emergencies you know to call 911 or go to the nearest emergency room

## 2014-07-10 ENCOUNTER — Non-Acute Institutional Stay (SKILLED_NURSING_FACILITY): Payer: Medicare Other | Admitting: Adult Health

## 2014-07-10 ENCOUNTER — Encounter: Payer: Self-pay | Admitting: Adult Health

## 2014-07-10 DIAGNOSIS — E1159 Type 2 diabetes mellitus with other circulatory complications: Secondary | ICD-10-CM | POA: Diagnosis not present

## 2014-07-10 DIAGNOSIS — I10 Essential (primary) hypertension: Secondary | ICD-10-CM

## 2014-07-10 DIAGNOSIS — I4891 Unspecified atrial fibrillation: Secondary | ICD-10-CM

## 2014-07-10 DIAGNOSIS — I739 Peripheral vascular disease, unspecified: Secondary | ICD-10-CM

## 2014-07-10 DIAGNOSIS — S82831A Other fracture of upper and lower end of right fibula, initial encounter for closed fracture: Secondary | ICD-10-CM

## 2014-07-10 DIAGNOSIS — K5900 Constipation, unspecified: Secondary | ICD-10-CM | POA: Diagnosis not present

## 2014-07-10 DIAGNOSIS — I6340 Cerebral infarction due to embolism of unspecified cerebral artery: Secondary | ICD-10-CM

## 2014-07-10 DIAGNOSIS — S82491A Other fracture of shaft of right fibula, initial encounter for closed fracture: Secondary | ICD-10-CM | POA: Diagnosis not present

## 2014-07-10 DIAGNOSIS — E785 Hyperlipidemia, unspecified: Secondary | ICD-10-CM

## 2014-07-10 DIAGNOSIS — E1151 Type 2 diabetes mellitus with diabetic peripheral angiopathy without gangrene: Secondary | ICD-10-CM

## 2014-07-10 NOTE — Progress Notes (Addendum)
Patient ID: Kristina Summers, female   DOB: 05-05-1935, 79 y.o.   MRN: [id number]   06/06/14  Facility:  Nursing Home Location:  [place] Room Number: 1101-1 LEVEL OF CARE:  SNF (31)   Chief Complaint  Patient presents with  . Hospitalization Follow-up    Acute CVA, right distal fibular fracture, paroxysmal atrial fibrillation, diabetes mellitus, hypertension, hyperlipidemia and constipation    HISTORY OF PRESENT ILLNESS:  This is a 79 year old female who has been admitted to [facility] on 06/03/14 from [facility]. She has PMH of poorly controlled diabetes, CAD S/P CABG, atrial fibrillation not on anticoagulation due to to history of bleeding and history of new falls. She had a fall at home and presented at [facility]. Lab work shows leukocytosis  16.4 and was diagnosed with right fibular fracture. Orthopedics consulted and placed splint with no surgery. She was also found to have UTI and was empirically treated with Rocephin. However, culture shows no growth.  On 05/30/14, she was found to have trouble finding words and neurology recommended for patient to be transferred to [facility] for stroke workup. MRI of brain showed small left middle cerebral artery territory infarct, left frontal lobe. MRA showed no central large vessel occlusion, distal thromboembolic disease.  She has been admitted for a short-term rehabilitation.  PAST MEDICAL HISTORY:  Past Medical History  Diagnosis Date  . CAD (coronary artery disease)   . Obesity   . Atrial fibrillation   . Hypertension   . Diabetes mellitus   . Hyperlipidemia   . Peripheral vascular disease   . GI bleed 1/11  . Carotid artery occlusion   . Normal nuclear stress test 03/18/2011  . Angina   . GERD (gastroesophageal reflux disease)   . Arthritis   . Fibromyalgia   . Myocardial infarction ~ 1991  . UTI (lower urinary tract infection) Sept. 2015    CURRENT MEDICATIONS:  Reviewed per MAR/see medication list  Allergies  Allergen Reactions  . Morphine And Related Nausea And Vomiting    Chest pain    . Lisinopril Cough  . Sertraline Hcl     REACTION: nausea  . Zoloft [Sertraline Hcl] Other (See Comments)    Nausea      REVIEW OF SYSTEMS: difficult to obtain due to difficulty finding words  PHYSICAL EXAMINATION  GENERAL: no acute distress, obese EYES: conjunctivae normal, sclerae normal, normal eye lids NECK: supple, trachea midline, no neck masses, no thyroid tenderness, no thyromegaly LYMPHATICS: no LAN in the neck, no supraclavicular LAN RESPIRATORY: breathing is even & unlabored, BS CTAB CARDIAC: RRR, no murmur,no extra heart sounds, no edema GI: abdomen soft, normal BS, no masses, no tenderness, no hepatomegaly, no splenomegaly EXTREMITIES:  Able to move 4 extremities; right lower extremity has splint with ace wrap PSYCHIATRIC: the patient is alert & oriented to person, affect & behavior appropriate  LABS/RADIOLOGY: Labs reviewed: Basic Metabolic Panel:  Recent Labs  06/01/14 0455 06/02/14 0502 06/03/14 0551  NA 135 136 136  K 3.5 3.7 4.1  CL 103 106 105  CO2 26 24 26   GLUCOSE 107* 94 91  BUN 15 10 10   CREATININE 0.72 0.68 0.76  CALCIUM 8.1* 8.4 8.6   Liver Function Tests:  Recent Labs  04/17/14 2307 05/29/14 1110 05/30/14 0254  AST 36 103* 104*  ALT 30 59* 64*  ALKPHOS 109 131* 106  BILITOT 0.6 1.1 0.9  PROT 7.6 7.7 6.2  ALBUMIN 3.2*  2.9* 2.3*   CBC:  Recent Labs  08/23/13 1417  04/17/14 2307 05/29/14 1110  06/01/14 0455 06/02/14 0502 06/03/14 0551  WBC 7.6  < > 8.6 16.4*  < > 10.1 9.9 10.7*  NEUTROABS 4.9  --  6.7 13.8*  --   --   --   --   HGB 13.9  < > 13.3 13.6  < > 12.0 12.1 12.6  HCT 39.6  < > 38.6 38.8  < > 35.9* 36.1 37.4  MCV 93.0  < > 91.9 91.9  < > 94.2 94.5 93.0  PLT 234  < > 239 254  < > 299 326 361  < > = values in this interval not displayed.  Lipid Panel:  Recent Labs   05/31/14 1006  HDL 11*   Cardiac Enzymes:  Recent Labs  05/29/14 1110 05/29/14 1548 05/29/14 2108 05/30/14 0253 05/30/14 0254 05/30/14 1204  CKTOTAL 994*  --   --   --  454* 289*  TROPONINI 0.04* 0.04* 0.05* 0.06*  --   --    CBG:  Recent Labs  06/02/14 2043 06/03/14 0653 06/03/14 1141  GLUCAP 251* 101* 263*    ASSESSMENT/PLAN:  Acute CVA - for rehabilitation; follow-up with Dr. [name] in one month; continue Eliquis 5 mg by mouth twice a day Right distal femur is at fracture - for rehabilitation; follow-up with Dr. [name], orthopedics; continue OxyIR 5 mg by mouth every 6 hours when necessary for pain  Paroxysmal atrial fibrillation - rate controlled; continue metoprolol 25 mg by mouth twice a day and Eliquis 5 mg by mouth twice a day Diabetes mellitus uncontrolled with peripheral artery disease - hemoglobin A1c 10.4; continue Humulin N 38 units subcutaneous every morning and 34 units subcutaneous every HS and Humulin R 5 units subcutaneous at bedtime Hypertension - continue Norvasc 5 mg by mouth daily, metoprolol 25 mg by mouth twice a day, Lasix 40 mg by mouth daily and Cozaar 50 mg by mouth daily Hyperlipidemia - continue Lipitor 80 mg by mouth daily Constipation - discontinue Colace and start senna S2 tabs by mouth twice a day and Dulcolax suppository 1 per rectum daily when necessary   Goals of care:  Short-term rehabilitation   Labs/test ordered:  BMP   Spent 50 minutes in patient care.    [facility], NP [employer] [phone]

## 2014-07-10 NOTE — Progress Notes (Addendum)
Patient ID: Monyae Hornes, female   DOB: 1936/03/21, 79 y.o.   MRN: [id number]   07/10/14  Facility:  Nursing Home Location:  [place] Room Number: 1101-1 LEVEL OF CARE:  SNF (31)   Chief Complaint  Patient presents with  . Discharge Note    CVA, right distal fibular fracture, paroxysmal atrial fibrillation, diabetes mellitus, hypertension, hyperlipidemia and constipation    HISTORY OF PRESENT ILLNESS:  This is a 79 year old female who is for discharge home with home health PT for gait and balance training, OT for self care and home safety , ST for communication and Nursing for disease management and wound care per home health protocol.  She has been admitted to [facility] on 06/03/14 from [facility]. She has PMH of poorly controlled diabetes, CAD S/P CABG, atrial fibrillation not on anticoagulation due to to history of bleeding and history of new falls. She had a fall at home and presented at [facility]. Lab work shows leukocytosis 16.4 and was diagnosed with right fibular fracture. Orthopedics was consulted and placed splint with no surgery. She was also found to have UTI and was empirically treated with Rocephin. However, culture shows no growth.  On 05/30/14, she was found to have trouble finding words and neurology recommended for patient to be transferred to [facility] for stroke workup. MRI of brain showed small left middle cerebral artery territory infarct, left frontal lobe. MRA showed no central large vessel occlusion, distal thromboembolic disease.  Patient was admitted to this facility for short-term rehabilitation after the patient's recent hospitalization.  Patient has completed SNF rehabilitation and therapy has cleared the patient for discharge.   PAST MEDICAL HISTORY:  Past Medical History  Diagnosis Date  . CAD (coronary artery disease)   . Obesity   . Atrial fibrillation   . Hypertension   . Diabetes mellitus     . Hyperlipidemia   . Peripheral vascular disease   . GI bleed 1/11  . Carotid artery occlusion   . Normal nuclear stress test 03/18/2011  . Angina   . GERD (gastroesophageal reflux disease)   . Arthritis   . Fibromyalgia   . Myocardial infarction ~ 1991  . UTI (lower urinary tract infection) Sept. 2015    CURRENT MEDICATIONS: Reviewed per MAR/see medication list  Allergies  Allergen Reactions  . Morphine And Related Nausea And Vomiting    Chest pain    . Lisinopril Cough  . Sertraline Hcl     REACTION: nausea  . Zoloft [Sertraline Hcl] Other (See Comments)    Nausea      REVIEW OF SYSTEMS: difficult to obtain due to difficulty finding words  PHYSICAL EXAMINATION  GENERAL: no acute distress, obese NECK: supple, trachea midline, no neck masses, no thyroid tenderness, no thyromegaly LYMPHATICS: no LAN in the neck, no supraclavicular LAN RESPIRATORY: breathing is even & unlabored, BS CTAB CARDIAC: RRR, no murmur,no extra heart sounds, no edema GI: abdomen soft, normal BS, no masses, no tenderness, no hepatomegaly, no splenomegaly EXTREMITIES:  Able to move 4 extremities; right lower extremity has splint with ace wrap PSYCHIATRIC: the patient is alert & oriented to person, affect & behavior appropriate  LABS/RADIOLOGY: 06/24/14  WBC 6.8 hemoglobin 11.9 hematocrit 35.1 MCV 92.6 Labs reviewed: Basic Metabolic Panel:  Recent Labs  06/01/14 0455 06/02/14 0502 06/03/14 0551  NA 135 136 136  K 3.5 3.7 4.1  CL 103 106 105  CO2 26 24 26   GLUCOSE  107* 94 91  BUN 15 10 10   CREATININE 0.72 0.68 0.76  CALCIUM 8.1* 8.4 8.6   Liver Function Tests:  Recent Labs  04/17/14 2307 05/29/14 1110 05/30/14 0254  AST 36 103* 104*  ALT 30 59* 64*  ALKPHOS 109 131* 106  BILITOT 0.6 1.1 0.9  PROT 7.6 7.7 6.2  ALBUMIN 3.2* 2.9* 2.3*   CBC:  Recent Labs  08/23/13 1417  04/17/14 2307 05/29/14 1110  06/01/14 0455 06/02/14 0502 06/03/14 0551  WBC 7.6  < > 8.6 16.4*   < > 10.1 9.9 10.7*  NEUTROABS 4.9  --  6.7 13.8*  --   --   --   --   HGB 13.9  < > 13.3 13.6  < > 12.0 12.1 12.6  HCT 39.6  < > 38.6 38.8  < > 35.9* 36.1 37.4  MCV 93.0  < > 91.9 91.9  < > 94.2 94.5 93.0  PLT 234  < > 239 254  < > 299 326 361  < > = values in this interval not displayed.  Lipid Panel:  Recent Labs  05/31/14 1006  HDL 11*   Cardiac Enzymes:  Recent Labs  05/29/14 1110 05/29/14 1548 05/29/14 2108 05/30/14 0253 05/30/14 0254 05/30/14 1204  CKTOTAL 994*  --   --   --  454* 289*  TROPONINI 0.04* 0.04* 0.05* 0.06*  --   --    CBG:  Recent Labs  06/02/14 2043 06/03/14 0653 06/03/14 1141  GLUCAP 251* 101* 263*    ASSESSMENT/PLAN:  Acute CVA - for home health PT, OT, Nursing and ST ; follow-up with Dr. [name] ; continue Eliquis 5 mg by mouth twice a day Right distal femur fracture - for home health PT, OT and nursing; follow-up with Dr. [name], orthopedics; continue OxyIR 5 mg by mouth every 6 hours when necessary for pain  Paroxysmal atrial fibrillation - rate controlled; continue metoprolol 25 mg by mouth twice a day and Eliquis 5 mg by mouth twice a day Diabetes mellitus uncontrolled with peripheral artery disease - hemoglobin A1c 10.4; continue Humulin N 38 units subcutaneous every morning and 34 units subcutaneous every HS and Humulin R 5 units subcutaneous at bedtime Hypertension - continue Norvasc 5 mg by mouth daily, metoprolol 25 mg by mouth twice a day, Lasix 40 mg by mouth daily and Cozaar 50 mg by mouth daily Hyperlipidemia - continue Lipitor 80 mg by mouth daily Constipation - continue Colace and start senna S2 tabs by mouth twice a day and Dulcolax suppository 1 per rectum daily when necessary    I have filled out patient's discharge paperwork and written prescriptions.  Patient will receive home health PT, OT, ST and Nursing.  Total discharge time: Greater than 30 minutes  Discharge time involved coordination of the discharge process with  social worker, nursing staff and therapy department. Medical justification for home health services verified.    [facility], NP [employer] [phone]

## 2014-07-18 ENCOUNTER — Encounter: Payer: Self-pay | Admitting: Family

## 2014-07-18 ENCOUNTER — Other Ambulatory Visit: Payer: Self-pay | Admitting: *Deleted

## 2014-07-18 DIAGNOSIS — I739 Peripheral vascular disease, unspecified: Secondary | ICD-10-CM

## 2014-07-18 DIAGNOSIS — Z48812 Encounter for surgical aftercare following surgery on the circulatory system: Secondary | ICD-10-CM

## 2014-07-19 ENCOUNTER — Other Ambulatory Visit (HOSPITAL_COMMUNITY): Payer: Medicare Other

## 2014-07-19 ENCOUNTER — Encounter (HOSPITAL_COMMUNITY): Payer: Medicare Other

## 2014-07-19 ENCOUNTER — Ambulatory Visit: Payer: Medicare Other | Admitting: Family

## 2014-07-19 ENCOUNTER — Inpatient Hospital Stay (HOSPITAL_COMMUNITY)
Admission: RE | Admit: 2014-07-19 | Discharge: 2014-07-19 | Disposition: A | Discharge status: Home / Self Care | Payer: Medicare Other | Source: Ambulatory Visit

## 2014-07-19 DIAGNOSIS — I70219 Atherosclerosis of native arteries of extremities with intermittent claudication, unspecified extremity: Secondary | ICD-10-CM

## 2014-07-19 DIAGNOSIS — Z48812 Encounter for surgical aftercare following surgery on the circulatory system: Secondary | ICD-10-CM

## 2014-07-19 DIAGNOSIS — I739 Peripheral vascular disease, unspecified: Secondary | ICD-10-CM

## 2014-09-10 ENCOUNTER — Other Ambulatory Visit: Payer: Self-pay | Admitting: Internal Medicine

## 2015-01-09 ENCOUNTER — Ambulatory Visit: Payer: Medicare Other | Admitting: Neurology

## 2015-01-09 ENCOUNTER — Ambulatory Visit (INDEPENDENT_AMBULATORY_CARE_PROVIDER_SITE_OTHER): Payer: Medicare Other | Admitting: Nurse Practitioner

## 2015-01-09 ENCOUNTER — Ambulatory Visit: Payer: Self-pay | Admitting: Neurology

## 2015-01-09 ENCOUNTER — Encounter: Payer: Self-pay | Admitting: Nurse Practitioner

## 2015-01-09 VITALS — BP 149/66 | HR 51 | Ht 66.0 in | Wt 201.8 lb

## 2015-01-09 DIAGNOSIS — R29898 Other symptoms and signs involving the musculoskeletal system: Secondary | ICD-10-CM | POA: Diagnosis not present

## 2015-01-09 DIAGNOSIS — I6340 Cerebral infarction due to embolism of unspecified cerebral artery: Secondary | ICD-10-CM

## 2015-01-09 DIAGNOSIS — I639 Cerebral infarction, unspecified: Secondary | ICD-10-CM | POA: Diagnosis not present

## 2015-01-09 NOTE — Patient Instructions (Addendum)
Keep systolic blood pressure less than 130, today's reading 149/66 Lipids are followed by Dr. [name] last cholesterol 92, LDL 48 Carotid Doppler to be repeated at next visit Continue Eliquis for  secondary stroke prevention If recurrent stroke symptoms occur, call 911 and proceed to the hospital Hemoglobin A1c less than 6.5 important for good control of blood sugar Follow-up in 6 months

## 2015-01-09 NOTE — Progress Notes (Addendum)
[facility]  PATIENT: Kristina Summers DOB: 24-Feb-1936   REASON FOR VISIT: Follow-up for left middle cerebral artery infarction,  HISTORY FROM: Patient    HISTORY OF PRESENT ILLNESS: Kristina Summers, 79 year old female returns for follow-up. She was last seen in the office by [name] 07/09/2014. 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 She did not get TPA due to the time delay. Carotid Doppler with 1-39% ICA stenosis.. She also has a history of diabetes and she claims her blood sugar this morning was 100. Her blood pressure is mildly elevated today in the office however she says she was very anxious to get to the appointment after being delayed  with her granddaughter earlier in the morning. She lives alone and is independent in her activities of daily living. She has not had further stroke or TIA symptoms since last seen. She still has some mild hesitancy with her speech. She has not had any falls she returns for reevaluation   HISTORY:Dr Almendra Horman is a 79 y.o. female here as a follow up. She was admitted 6 weeks ago for difficulty speaking. Kristina Summers is a 79 y.o. female with a history of afib s/p ablation not on [facility] procedures who presented following a fall with ankle fracture to the ED. She is a care facility. The morning she was brought to the ED, she was "spacey" when the nurse spoke with her and seemed to have some trouble finding her words, but was able to respond. When her daughter saw her around 8am, she noticed she was off, but thought it was pain medication. She subsequently saw her again around 9:45 am and patient was still having difficulty speaking. MRI of the head revealed an acute, small left MCA infarct. At [facility] hospital, NIHSS: 6(2 for questions, 1 for gaze preference, 2 for aphasia, 1 for dysarthria). Patient was not administered TPA secondary to delay in arrival. She was admitted for further evaluation and  treatment. She is feeling better. Her speech is improving, here with daughter and almost back to her baseline. Daughter agrees. She says she went to the emergency room for her ankle and while there they noticed aphasia. That is what led to the admission.    REVIEW OF SYSTEMS: Full 14 system review of systems performed and notable only for those listed, all others are neg:  Constitutional: neg  Cardiovascular: neg Ear/Nose/Throat: neg  Skin: neg Eyes: Blurred vision Respiratory: neg Gastroitestinal: neg  Hematology/Lymphatic: neg  Endocrine: neg Musculoskeletal: Joint pain Allergy/Immunology: neg Neurological: neg Psychiatric: neg Sleep : neg   ALLERGIES: Allergies  Allergen Reactions  . Morphine And Related Nausea And Vomiting    Chest pain    . Lisinopril Cough  . Sertraline Hcl     REACTION: nausea  . Zoloft [Sertraline Hcl] Other (See Comments)    Nausea     HOME MEDICATIONS: Outpatient Prescriptions Prior to Visit  Medication Sig Dispense Refill  . acetaminophen (TYLENOL) 325 MG tablet Take 2 tablets (650 mg total) by mouth every 6 (six) hours as needed for mild pain (or Fever >/= 101). 60 tablet 1  . amLODipine (NORVASC) 5 MG tablet Take 1 tablet by mouth daily.    Marland Kitchen apixaban (ELIQUIS) 5 MG TABS tablet Take 1 tablet (5 mg total) by mouth 2 (two) times daily. 60 tablet 0  . atorvastatin (LIPITOR) 80 MG tablet Take 1 tablet (80 mg total) by mouth daily. 30 tablet 0  .  docusate sodium (COLACE) 100 MG capsule Take 1 capsule (100 mg total) by mouth 2 (two) times daily. 10 capsule 0  . furosemide (LASIX) 40 MG tablet Take 1 tablet (40 mg total) by mouth daily. 30 tablet   . insulin NPH Human (HUMULIN N,NOVOLIN N) 100 UNIT/ML injection Inject 0.38 mLs (38 Units total) into the skin 2 (two) times daily. 38 in am and 34 at night (Patient taking differently: Inject 38 Units into the skin 2 (two) times daily. 37 in am and 32 at night) 10 mL 11  . losartan (COZAAR) 50 MG tablet  Take 1 tablet (50 mg total) by mouth daily.    . metoprolol tartrate (LOPRESSOR) 25 MG tablet Take 1 tablet by mouth 2 (two) times daily.    . Multiple Vitamins-Minerals (ONE-A-DAY 50 PLUS PO) Take 1 tablet by mouth daily.    Marland Kitchen NITROSTAT 0.4 MG SL tablet Place 1 tablet under the tongue every 5 (five) minutes x 3 doses as needed. Chest pain  1  . HUMULIN R 100 UNIT/ML injection Inject 5 Units into the skin at bedtime.     Marland Kitchen oxyCODONE (OXY IR/ROXICODONE) 5 MG immediate release tablet Take one tablet by mouth every 6 hours as needed for severe pain (Patient not taking: Reported on 01/09/2015) 120 tablet 0  . polyethylene glycol (MIRALAX / GLYCOLAX) packet Take 17 g by mouth daily. (Patient not taking: Reported on 01/09/2015) 14 each 0   No facility-administered medications prior to visit.    PAST MEDICAL HISTORY: Past Medical History  Diagnosis Date  . CAD (coronary artery disease)   . Obesity   . Atrial fibrillation   . Hypertension   . Diabetes mellitus   . Hyperlipidemia   . Peripheral vascular disease   . GI bleed 1/11  . Carotid artery occlusion   . Normal nuclear stress test 03/18/2011  . Angina   . GERD (gastroesophageal reflux disease)   . Arthritis   . Fibromyalgia   . Myocardial infarction ~ 1991  . UTI (lower urinary tract infection) Sept. 2015    PAST SURGICAL HISTORY: Past Surgical History  Procedure Laterality Date  . Trigger finger release  1996    right thumb  . Cataract extraction w/ intraocular lens  implant, bilateral  2004-2005  . Coronary artery bypass graft  2000    CABG X5  . Peripheral arterial stent graft  04/01/11    right common iliac  . Angioplasty  06/17/11    Left leg common femoral artery cannulation under u/s Left leg runoff  . Eye surgery    . Abdominal aortagram N/A 04/01/2011    Procedure: ABDOMINAL AORTAGRAM;  Surgeon: Conrad Del Norte, MD;  Location: [facility] CATH LAB;  Service: Cardiovascular;  Laterality: N/A;  . Percutaneous stent intervention  Right 04/01/2011    Procedure: PERCUTANEOUS STENT INTERVENTION;  Surgeon: Conrad Esto, MD;  Location: [facility] CATH LAB;  Service: Cardiovascular;  Laterality: Right;  rt iliac stent  . Lower extremity angiogram Bilateral 04/01/2011    Procedure: LOWER EXTREMITY ANGIOGRAM;  Surgeon: Conrad Anderson[place], MD;  Location: [facility] CATH LAB;  Service: Cardiovascular;  Laterality: Bilateral;  . Lower extremity angiogram Left 06/17/2011    Procedure: LOWER EXTREMITY ANGIOGRAM;  Surgeon: Conrad Leakey, MD;  Location: [facility] CATH LAB;  Service: Cardiovascular;  Laterality: Left;  . Lower extremity angiogram N/A 11/18/2011    Procedure: LOWER EXTREMITY ANGIOGRAM;  Surgeon: Conrad Arapahoe, MD;  Location[facility] CATH LAB;  Service: Cardiovascular;  Laterality:  N/A;    FAMILY HISTORY: Family History  Problem Relation Age of Onset  . Other Brother     intestinal blockage  . Diabetes Brother     SOCIAL HISTORY: Social History   Social History  . Marital Status: Divorced    Spouse Name: N/A  . Number of Children: 3  . Years of Education: 12   Occupational History  . Retired    Social History Main Topics  . Smoking status: Former Smoker -- 2.00 packs/day for 30 years    Types: Cigarettes    Quit date: 04/19/1990  . Smokeless tobacco: Never Used     Comment: stopped smoking cigarettes 1991  . Alcohol Use: No  . Drug Use: No  . Sexual Activity: No   Other Topics Concern  . Not on file   Social History Narrative   Divorced.  Native of [place].  Formerly worked as Education administrator person.   Caffeine use: drinks decaf tea and coffee        PHYSICAL EXAM  Filed Vitals:   01/09/15 1132  BP: 149/66  Pulse: 51  Height: 5\' 6"  (1.676 m)  Weight: 201 lb 12.8 oz (91.536 kg)   Body mass index is 32.59 kg/(m^2).  Generalized: Well developed, obese female in no acute distress  Head: normocephalic and atraumatic,. Oropharynx benign  Neck: Supple, no carotid bruits  Cardiac: Regular rate rhythm, no murmur  Musculoskeletal:  No deformity   Neurological examination   Mentation: Alert oriented to time, place, history taking. Attention span and concentration appropriate. Recent and remote memory intact.  Follows all commands , mild expressive aphasia    Cranial nerve II-XII: Pupils were equal round reactive to light extraocular movements were full, visual field were full on confrontational test. Mild right lower facial weakness . Hearing was intact to finger rubbing bilaterally. Uvula tongue midline. head turning and shoulder shrug were normal and symmetric.Tongue protrusion into cheek strength was normal. Motor: normal bulk and tone, full strength in the BUE, BLE, fine finger movements mildly diminished on the right  Sensory: normal and symmetric to light touch, pinprick, and  Vibration,   Coordination: finger-nose-finger, heel-to-shin bilaterally, no dysmetria Reflexes: Brachioradialis 2/2, biceps 2/2, triceps 2/2, patellar 2/2, Achilles 2/2, plantar responses were flexor bilaterally. Gait and Station: Rising up from seated position without assistance, normal stance,  moderate stride, good arm swing, smooth turning,   DIAGNOSTIC DATA (LABS, IMAGING, TESTING) - I reviewed patient records, labs, notes, testing and imaging myself where available.  Lab Results  Component Value Date   WBC 10.7* 06/03/2014   HGB 12.6 06/03/2014   HCT 37.4 06/03/2014   MCV 93.0 06/03/2014   PLT 361 06/03/2014      Component Value Date/Time   NA 136 06/03/2014 0551   K 4.1 06/03/2014 0551   CL 105 06/03/2014 0551   CO2 26 06/03/2014 0551   GLUCOSE 91 06/03/2014 0551   BUN 10 06/03/2014 0551   CREATININE 0.76 06/03/2014 0551   CALCIUM 8.6 06/03/2014 0551   PROT 6.2 05/30/2014 0254   ALBUMIN 2.3* 05/30/2014 0254   AST 104* 05/30/2014 0254   ALT 64* 05/30/2014 0254   ALKPHOS 106 05/30/2014 0254   BILITOT 0.9 05/30/2014 0254   GFRNONAA 80* 06/03/2014 0551   GFRAA >90 06/03/2014 0551   Lab Results  Component Value Date    CHOL 92 12-25-202016   HDL 11* 12-25-202016   LDLCALC 48 12-25-202016   TRIG 165* 12-25-202016   CHOLHDL 8.4 12-25-202016  Lab Results  Component Value Date   HGBA1C 10.4* 05/29/2014   Lab Results  Component Value Date   TSH 1.469 05/29/2014      ASSESSMENT AND PLAN  79 y.o. year old female  has a past medical history of coronary artery disease status post CABG, poorly controlled diabetes, left MCA frontal lobe infarct secondary to atrial fibrillation with expressive aphasia that has improved. She has multiple risk factors of atrial fibrillation, hypertension, elevated triglycerides and uncontrolled diabetes.  PLAN: Keep systolic blood pressure less than 130, today's reading 149/66 Lipids are followed by Dr. [name] last cholesterol 92, LDL 48, triglycerides 165 Carotid Doppler to be repeated at next visit Continue Eliquis for  secondary stroke prevention Hemoglobin A1c less than 6.5 important for good control of blood sugar I spent additional 15 minutes in total face to face time with the patient more than 50% of which was spent counseling and coordination of care, reviewing test results reviewing medications and discussing and reviewing the diagnosis of stroke and the importance of risk factor control. She was made aware that she has a 10- 15% risk of recurrent stroke in the first year followed by 4-5% risk after that time  If recurrent stroke symptoms occur, call 911 and proceed to the hospital Follow-up in 6 months, at that time if she remains stable she will be discharged Vst time 25 min Dennie Bible, [place], [facility], [facility] [address] [place], [place] [address] [phone]  Personally  participated in and made any corrections needed to history, physical, neuro exam,assessment and plan as stated above.  Sarina Ill, MD [facility]

## 2015-05-16 ENCOUNTER — Encounter: Payer: Self-pay | Admitting: Podiatry

## 2015-05-16 ENCOUNTER — Ambulatory Visit (INDEPENDENT_AMBULATORY_CARE_PROVIDER_SITE_OTHER): Payer: Medicare Other | Admitting: Podiatry

## 2015-05-16 DIAGNOSIS — Q828 Other specified congenital malformations of skin: Secondary | ICD-10-CM | POA: Diagnosis not present

## 2015-05-16 DIAGNOSIS — M79676 Pain in unspecified toe(s): Secondary | ICD-10-CM

## 2015-05-16 DIAGNOSIS — B351 Tinea unguium: Secondary | ICD-10-CM

## 2015-05-16 DIAGNOSIS — E1151 Type 2 diabetes mellitus with diabetic peripheral angiopathy without gangrene: Secondary | ICD-10-CM | POA: Diagnosis not present

## 2015-05-16 NOTE — Progress Notes (Signed)
Patient ID: Kristina Summers, female   DOB: March 05, 1936, 80 y.o.   MRN: [id number] Complaint:  Visit Type: Patient returns to my office for continued preventative foot care services. Complaint: Patient states" my nails have grown long and thick and become painful to walk and wear shoes" Patient has been diagnosed with DM with no foot complications. The patient presents for preventative foot care services. No changes to ROS.  Painful callus on balls of both feet.  Podiatric Exam: Vascular: dorsalis pedis and posterior tibial pulses are not  palpable bilateral. Capillary return is immediate. Temperature gradient is WNL. Skin turgor WNL  Sensorium: Diminished  Semmes Weinstein monofilament test. Normal tactile sensation bilaterally. Nail Exam: Pt has thick disfigured discolored nails with subungual debris noted bilateral entire nail hallux through fifth toenails Ulcer Exam: There is no evidence of ulcer or pre-ulcerative changes or infection. Orthopedic Exam: Muscle tone and strength are WNL. No limitations in general ROM. No crepitus or effusions noted. Foot type and digits show no abnormalities. Bony prominences are unremarkable. Skin: No Porokeratosis. No infection or ulcers.  Porokeratosis sub5th met B/L Diagnosis:  Onychomycosis, , Pain in right toe, pain in left toes,   porokeratosis  Treatment & Plan Procedures and Treatment: Consent by patient was obtained for treatment procedures. The patient understood the discussion of treatment and procedures well. All questions were answered thoroughly reviewed. Debridement of mycotic and hypertrophic toenails, 1 through 5 bilateral and clearing of subungual debris. No ulceration, no infection noted. Debride porokeratosis Return Visit-Office Procedure: Patient instructed to return to the office for a follow up visit 3 months for continued evaluation and treatment.    Gardiner Barefoot DPM

## 2015-07-09 ENCOUNTER — Ambulatory Visit (INDEPENDENT_AMBULATORY_CARE_PROVIDER_SITE_OTHER): Payer: Medicare Other | Admitting: Nurse Practitioner

## 2015-07-09 ENCOUNTER — Encounter: Payer: Self-pay | Admitting: Nurse Practitioner

## 2015-07-09 VITALS — BP 130/62 | HR 67 | Wt 211.0 lb

## 2015-07-09 DIAGNOSIS — I6320 Cerebral infarction due to unspecified occlusion or stenosis of unspecified precerebral arteries: Secondary | ICD-10-CM

## 2015-07-09 DIAGNOSIS — I4891 Unspecified atrial fibrillation: Secondary | ICD-10-CM | POA: Diagnosis not present

## 2015-07-09 DIAGNOSIS — I739 Peripheral vascular disease, unspecified: Secondary | ICD-10-CM

## 2015-07-09 DIAGNOSIS — E1151 Type 2 diabetes mellitus with diabetic peripheral angiopathy without gangrene: Secondary | ICD-10-CM

## 2015-07-09 NOTE — Progress Notes (Addendum)
[facility]  PATIENT: Kristina Summers DOB: 27-Jul-1935   REASON FOR VISIT: Follow-up for history of stroke HISTORY FROM: Patient/ daughters    HISTORY OF PRESENT ILLNESS:07/09/15 CM Kristina Summers, 80 year old female returns for follow-up. 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 She also has a history of diabetes which has been poorly controlled in the past however she says her blood sugar this morning was 98. She gets little exercise. She also has a history of hypertension and blood pressure in the office today 130/62. She continues to live alone and independent activities of daily living. She has not had further stroke or TIA symptoms. She still has some mild hesitancy with her speech. She sees Kristina. Zadie Rhine for her visual disturbance. She denies any falls. Her labs are followed by primary care who she will see the week. She needs repeat carotid Doppler. She returns for reevaluation 01/09/15 CMMs. Kristina Summers, 80 year old female returns for follow-up. She was last seen in the office by Kristina. Jaynee Eagles 07/09/2014. 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 She did not get TPA due to the time delay. Carotid Doppler with 1-39% ICA stenosis.. She also has a history of diabetes and she claims her blood sugar this morning was 100. Her blood pressure is mildly elevated today in the office however she says she was very anxious to get to the appointment after being delayed with her granddaughter earlier in the morning. She lives alone and is independent in her activities of daily living. She has not had further stroke or TIA symptoms since last seen. She still has some mild hesitancy with her speech. She has not had any falls she returns for reevaluation   HISTORY:Kristina Summers is a 80 y.o. female here as a follow up. She was admitted 6 weeks ago for difficulty speaking. Kristina Summers is a 80 y.o. female with a history of afib s/p  ablation not on [facility] procedures who presented following a fall with ankle fracture to the ED. She is a care facility. The morning she was brought to the ED, she was "spacey" when the nurse spoke with her and seemed to have some trouble finding her words, but was able to respond. When her daughter saw her around 8am, she noticed she was off, but thought it was pain medication. She subsequently saw her again around 9:45 am and patient was still having difficulty speaking. MRI of the head revealed an acute, small left MCA infarct. At [facility] hospital, NIHSS: 6(2 for questions, 1 for gaze preference, 2 for aphasia, 1 for dysarthria). Patient was not administered TPA secondary to delay in arrival. She was admitted for further evaluation and treatment. She is feeling better. Her speech is improving, here with daughter and almost back to her baseline. Daughter agrees. She says she went to the emergency room for her ankle and while there they noticed aphasia. That is what led to the admission.     REVIEW OF SYSTEMS: Full 14 system review of systems performed and notable only for those listed, all others are neg:  Constitutional: neg  Cardiovascular: neg Ear/Nose/Throat: neg  Skin: neg Eyes: Blurred vision Respiratory: neg Gastroitestinal: neg  Hematology/Lymphatic: neg  Endocrine: neg Musculoskeletal:neg Allergy/Immunology: neg Neurological: Occasional dizziness Psychiatric: neg Sleep : neg   ALLERGIES: Allergies  Allergen Reactions  . Morphine And Related Nausea And Vomiting    Chest pain    . Lisinopril Cough  .  Sertraline Hcl     REACTION: nausea  . Zoloft [Sertraline Hcl] Other (See Comments)    Nausea     HOME MEDICATIONS: Outpatient Prescriptions Prior to Visit  Medication Sig Dispense Refill  . acetaminophen (TYLENOL) 325 MG tablet Take 2 tablets (650 mg total) by mouth every 6 (six) hours as needed for mild pain (or Fever >/= 101). 60 tablet 1  . amLODipine (NORVASC) 5 MG tablet  Take 1 tablet by mouth daily.    Marland Kitchen apixaban (ELIQUIS) 5 MG TABS tablet Take 1 tablet (5 mg total) by mouth 2 (two) times daily. 60 tablet 0  . atorvastatin (LIPITOR) 80 MG tablet Take 1 tablet (80 mg total) by mouth daily. 30 tablet 0  . docusate sodium (COLACE) 100 MG capsule Take 1 capsule (100 mg total) by mouth 2 (two) times daily. 10 capsule 0  . furosemide (LASIX) 40 MG tablet Take 1 tablet (40 mg total) by mouth daily. 30 tablet   . HUMULIN R 100 UNIT/ML injection Inject 5 Units into the skin at bedtime.     . insulin NPH Human (HUMULIN N,NOVOLIN N) 100 UNIT/ML injection Inject 0.38 mLs (38 Units total) into the skin 2 (two) times daily. 38 in am and 34 at night (Patient taking differently: Inject 38 Units into the skin 2 (two) times daily. 37 in am and 32 at night) 10 mL 11  . losartan (COZAAR) 50 MG tablet Take 1 tablet (50 mg total) by mouth daily.    . metoprolol tartrate (LOPRESSOR) 25 MG tablet Take 1 tablet by mouth 2 (two) times daily.    . Multiple Vitamins-Minerals (ONE-A-DAY 50 PLUS PO) Take 1 tablet by mouth daily.    Marland Kitchen NITROSTAT 0.4 MG SL tablet Place 1 tablet under the tongue every 5 (five) minutes x 3 doses as needed. Chest pain  1   No facility-administered medications prior to visit.    PAST MEDICAL HISTORY: Past Medical History  Diagnosis Date  . CAD (coronary artery disease)   . Obesity   . Atrial fibrillation ([place])   . Hypertension   . Diabetes mellitus   . Hyperlipidemia   . Peripheral vascular disease ([place])   . GI bleed 1/11  . Carotid artery occlusion   . Normal nuclear stress test 03/18/2011  . Angina   . GERD (gastroesophageal reflux disease)   . Arthritis   . Fibromyalgia   . Myocardial infarction ([place]) ~ 1991  . UTI (lower urinary tract infection) Sept. 2015    PAST SURGICAL HISTORY: Past Surgical History  Procedure Laterality Date  . Trigger finger release  1996    right thumb  . Cataract extraction w/ intraocular lens  implant, bilateral   2004-2005  . Coronary artery bypass graft  2000    CABG X5  . Peripheral arterial stent graft  04/01/11    right common iliac  . Angioplasty  06/17/11    Left leg common femoral artery cannulation under u/s Left leg runoff  . Eye surgery    . Abdominal aortagram N/A 04/01/2011    Procedure: ABDOMINAL AORTAGRAM;  Surgeon: Conrad Bay Hill, MD;  Location: [facility] CATH LAB;  Service: Cardiovascular;  Laterality: N/A;  . Percutaneous stent intervention Right 04/01/2011    Procedure: PERCUTANEOUS STENT INTERVENTION;  Surgeon: Conrad [place] M[place], MD;  Location: [facility] CATH LAB;  Service: Cardiovascular;  Laterality: Right;  rt iliac stent  . Lower extremity angiogram Bilateral 04/01/2011    Procedure: LOWER EXTREMITY ANGIOGRAM;  Surgeon: Jannette Fogo  Bridgett Larsson, MD;  Location: [facility] CATH LAB;  Service: Cardiovascular;  Laterality: Bilateral;  . Lower extremity angiogram Left 06/17/2011    Procedure: LOWER EXTREMITY ANGIOGRAM;  Surgeon: Conrad Livingston, MD;  Location: [facility] CATH LAB;  Service: Cardiovascular;  Laterality: Left;  . Lower extremity angiogram N/A 11/18/2011    Procedure: LOWER EXTREMITY ANGIOGRAM;  Surgeon: Conrad [place] Heart, MD;  Location[facility] CATH LAB;  Service: Cardiovascular;  Laterality: N/A;    FAMILY HISTORY: Family History  Problem Relation Age of Onset  . Other Brother     intestinal blockage  . Diabetes Brother     SOCIAL HISTORY: Social History   Social History  . Marital Status: Divorced    Spouse Name: N/A  . Number of Children: 3  . Years of Education: 12   Occupational History  . Retired    Social History Main Topics  . Smoking status: Former Smoker -- 2.00 packs/day for 30 years    Types: Cigarettes    Quit date: 04/19/1990  . Smokeless tobacco: Never Used     Comment: stopped smoking cigarettes 1991  . Alcohol Use: No  . Drug Use: No  . Sexual Activity: No   Other Topics Concern  . Not on file   Social History Narrative   Divorced.  Native of [place].  Formerly worked as Education administrator  person.   Caffeine use: drinks decaf tea and coffee        PHYSICAL EXAM  Filed Vitals:   07/09/15 1025  BP: 130/62  Pulse: 67  Weight: 211 lb (95.709 kg)   Body mass index is 34.07 kg/(m^2). Generalized: Well developed, obese female in no acute distress  Head: normocephalic and atraumatic,. Oropharynx benign  Neck: Supple, no carotid bruits  Cardiac: Regular rate rhythm, no murmur  Musculoskeletal: No deformity   Neurological examination   Mentation: Alert oriented to time, place, history taking. Attention span and concentration appropriate. Recent and remote memory intact. Follows all commands , mild expressive aphasia   Cranial nerve II-XII: Pupils were equal round reactive to light extraocular movements were full, visual field were full on confrontational test. Mild right lower facial weakness . Hearing was intact to finger rubbing bilaterally. Uvula tongue midline. head turning and shoulder shrug were normal and symmetric.Tongue protrusion into cheek strength was normal. Motor: normal bulk and tone, full strength in the BUE, BLE, fine finger movements mildly diminished on the right  Sensory: normal and symmetric to light touch, pinprick, and Vibration,  Coordination: finger-nose-finger, heel-to-shin bilaterally, no dysmetria Reflexes: Brachioradialis 2/2, biceps 2/2, triceps 2/2, patellar 2/2, Achilles 2/2, plantar responses were flexor bilaterally. Gait and Station: Rising up from seated position without assistance, normal stance, moderate stride, good arm swing, smooth turning, tandem gait is unsteady   DIAGNOSTIC DATA (LABS, IMAGING, TESTING) -   ASSESSMENT AND PLAN 80 y.o. year old female has a past medical history of coronary artery disease status post CABG, poorly controlled diabetes, left MCA frontal lobe infarct secondary to atrial fibrillation with expressive aphasia that has improved. She has multiple risk factors of atrial fibrillation, hypertension,  elevated triglycerides and  diabetes.  PLAN: Keep systolic blood pressure less than 130, today's reading 130/62 Lipids are followed by Kristina. [name] other labs by Kristina. Marlou Sa Carotid Doppler to be  Scheduled  on the way out today Continue Eliquis for secondary stroke prevention Hemoglobin A1c less than 6.5 important for good control of blood sugar fasting CBG this am 98 Follow-up in 6 months, next with Kristina.  Ahern at that time if she remains stable she will be discharged Vst time 25 min Dennie Bible, [facility], [facility], APRN  [facility] [address], [place], [place] [address] [phone]  Personally participated in and made any corrections needed to history, physical, neuro exam,assessment and plan as stated above.  I have personally evaluated lab data, reviewed imaging studies and agree with radiology interpretations.    Sarina Ill, MD Stroke Neurology [phone] [facility]

## 2015-07-09 NOTE — Patient Instructions (Addendum)
Keep systolic blood pressure less than 130, today's reading 130/62 Lipids are followed by Dr. [name]  Carotid Doppler to be repeated will schedule on the way out today Continue Eliquis for secondary stroke prevention Hemoglobin A1c less than 6.5 important for good control of blood sugar fasting CBG this am 98 Follow-up in 6 months, next with [name]

## 2015-08-22 ENCOUNTER — Ambulatory Visit: Payer: Medicare Other | Admitting: Podiatry

## 2015-08-27 ENCOUNTER — Ambulatory Visit (INDEPENDENT_AMBULATORY_CARE_PROVIDER_SITE_OTHER): Payer: Medicare Other

## 2015-08-27 DIAGNOSIS — I6320 Cerebral infarction due to unspecified occlusion or stenosis of unspecified precerebral arteries: Secondary | ICD-10-CM

## 2015-08-27 DIAGNOSIS — I4891 Unspecified atrial fibrillation: Secondary | ICD-10-CM

## 2015-09-02 ENCOUNTER — Encounter: Payer: Self-pay | Admitting: Cardiology

## 2015-09-02 NOTE — Progress Notes (Signed)
Patient ID: Kristina Summers, female   DOB: 02/04/1936, 80 y.o.   MRN: [id number]   Ren…E, Lye    Date of visit:  09/02/2015 DOB:  [birth year]    Age:  80 yrs. Medical record number:  [id number]     Account number:  [id number] Primary Care Provider: Marlou Sa, ERIC L ____________________________ CURRENT DIAGNOSES  1. CAD Native without angina  2. Paroxysmal atrial fibrillation  3. Type 2 diabetes mellitus with diabetic polyneuropathy  4. Obesity  5. Hyperlipidemia  6. Long term (current) use of anticoagulants  7. Aortic valve disorder, unspecified  8. Hypertensive heart disease without heart failure  9. Cerebral Infarction Due To Embolism Of Left Middle Cerebral Artery  10. 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 Old myocardial infarction  51. Presence of aortocoronary bypass graft ____________________________ ALLERGIES  JANUVIA, Intolerance-dizziness  Lisinopril, Intolerance-cough  Morphine, Severe nausea & vomiting ____________________________ MEDICATIONS  1. metoprolol tartrate 25 mg tablet, BID  2. losartan 50 mg tablet, BID  3. amlodipine 5 mg tablet, 1 p.o. daily  4. atorvastatin 80 mg tablet, 1 p.o. daily  5. nitroglycerin 0.4 mg sublingual tablet, PRN  6. furosemide 40 mg tablet, BID  7. multivitamin tablet, 1 p.o. daily  8. Eliquis 5 mg tablet, BID  9. Novolin N 100 unit/mL subcutaneous suspension, 37u qam  31u qpm ____________________________ CHIEF COMPLAINTS  Followup of CAD Native without angina ____________________________ HISTORY OF PRESENT ILLNESS Patient seen for cardiac followup. She is having difficulty with her eyesight at the present time and also was having more difficulty walking. She complains of painful feet and describes an extensive detail a difficult encounter with a podiatrist. She has been complaining that she cannot walk because of her feet bothering her and complains of pain involving both of her calves  with short distance. She has a previous history of peripheral vascular disease and denies PND, orthopnea or edema. She does not have any significant claudication. No bleeding complications from anticoagulation. ____________________________ PAST HISTORY  Past Medical Illnesses:  DM-insulin dependent, hypertension, hyperlipidemia, obesity, carpal tunnel syndrome, GI bleed 1/11, left MCA stroke 2016;  Cardiovascular Illnesses:  CAD, S/P MI-inferior 1992, atrial fibrillation, mild AV disease, peripheral vascular disease;  Surgical Procedures:  CABG w LIMA LAD, SVG to dx, SVG to OM, SVG to PD-PL 11/1998 Dr. Cyndia Bent, Excision of neck mass 1996 Dr. Lucia Gaskins, bilateral trigger thumb release;  NYHA Classification:  II;  Canadian Angina Classification:  Class 0: Asymptomatic;  Cardiology Procedures-Invasive:  cardiac cath (left) 2000, PTCA RCA 1993;  Cardiology Procedures-Noninvasive:  regadenoson cardiolte September 2010, echocardiogram September 2010, event monitor September 2010, lexiscan cardiolite November 2012;  Cardiac Cath Results:  20 % stenosis distal Left main, 60% stenosis mid LAD, 70% stenosis proximal CFX, occluded RCA;  Peripheral Vascular Procedures:  stent right common iliac 03/31/2012 Dr. Bridgett Larsson, atherectomy left common femoral 06/17/11 Dr. Bridgett Larsson, carotid doppler September 2014;  LVEF of 60% documented via echocardiogram on 05/30/2014,   ____________________________ CARDIO-PULMONARY TEST DATES EKG Date:  09/02/2015;   Cardiac Cath Date:  11/13/1998;  CABG: 11/20/1998;  Holter/Event Monitor Date: 12/24/2008;  Nuclear Study Date:  03/18/2011;  Echocardiography Date: 05/30/2014;  Chest Xray Date: 04/22/2009;   ____________________________ FAMILY HISTORY Brother -- Brother dead, Diabetes mellitus, Cardiovascular disease Brother -- Brother alive with problem, Type 2 Diabetes Father -- Father dead, Myocardial infarction at greater than 80 Mother -- Mother dead, CVA ____________________________ SOCIAL  HISTORY Alcohol Use:  no alcohol use;  Smoking:  used  to smoke but quit;  Diet:  regular diet without modifications;  Lifestyle:  divorced and native of [place];  Exercise:  exercise is limited due to physical disability;  Occupation:  disabled;  Residence:  lives alone;  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;   ____________________________ REVIEW OF SYSTEMS General:  obesity, malaise and fatigue, weight gain of approximately 5 lbs Eyes: diabetic retinopathy, laser treatments, cataract extraction with lens implants, recent repair of macular pucker Respiratory: mild dyspnea with exertion Cardiovascular:  please review HPI Abdominal: denies dyspepsia, GI bleeding, constipation, or diarrheaGenitourinary-Female: frequency, stress incontinence, urgency Musculoskeletal:  generalized arthritis Neurological:  mild expressive aphasia, unsteady gait  ____________________________ PHYSICAL EXAMINATION VITAL SIGNS  Blood Pressure:  140/64 Sitting, Left arm, regular cuff  , 132/62 Standing, Left arm and regular cuff   Pulse:  60/min. Weight:  210.00 lbs. Height:  65"BMI: 35  Constitutional:  pleasant white female, in no acute distress, moderately obese Skin:  warm and dry to touch, no apparent skin lesions, or masses noted. Head:  normocephalic, normal hair pattern, no masses or tenderness ENT:  ears, nose and throat unremarkable, full mouth dentures present Neck:  bilateral carotid bruits, no JVD, no masses Chest:  clear to auscultation, healed median sternotomy scar Cardiac:  regular rhythm, normal S1 and S2, No S3 or S4, no murmurs, gallops or rubs detected. Peripheral Pulses:  femoral pulses 2+, posterior tibial pulses palpable Extremities & Back:  well healed saphenous vein donor site RLE, 1+ ankle edema right leg, bilateral lower extremity venous varicosities Neurological:  decreased sensation in feet, mild expressive aphasia, unsteady gait ____________________________ MOST RECENT  LIPID PANEL 09/02/15  CHOL TOTL 126 mg/dl, LDL 74 NM, HDL 26 mg/dl, TRIGLYCER 132 mg/dl and CHOL/HDL 4.9 (Calc) ____________________________ IMPRESSIONS/PLAN  1. Significant peripheral vascular disease with ongoing pain in her feet-I recommended that she have a followup with the vascular surgeon as well as repeat peripheral vascular studies 2. Coronary artery disease with previous bypass graft without angina 3. Prior embolic stroke with residual aphasia 4. Some gait disorder 5. Diabetes mellitus with complications  Recommendations:  EKG shows atrial fibrillation with controlled response and an IV conduction delay. I recommended evaluation with her vascular surgeon as well as arterial Dopplers. She really doesn't will see the previous podiatrist that she saw so recommended that she get another one. Recommended followup in 6 months and call if problems. ____________________________ TODAYS ORDERS  1. 12 Lead EKG: Today  2. Bil L.E. Arterial Duplex: At Patient Convenience  3. Return Visit: 6 months  4. Lipid Panel: Today                       ____________________________ Cardiology Physician:  Kerry Hough MD [facility]

## 2015-09-03 ENCOUNTER — Other Ambulatory Visit: Payer: Self-pay | Admitting: *Deleted

## 2015-09-03 DIAGNOSIS — I739 Peripheral vascular disease, unspecified: Secondary | ICD-10-CM

## 2015-09-05 ENCOUNTER — Telehealth: Payer: Self-pay | Admitting: *Deleted

## 2015-09-05 NOTE — Telephone Encounter (Signed)
I called and spoke to pt and let her know that the doppler study for the carotid arteries was essentially negative for significant narrowing.  She verbalized understanding.

## 2015-09-05 NOTE — Telephone Encounter (Signed)
-----   Message from Dennie Bible, NP sent at 09/04/2015  1:58 PM EDT ----- Carotid Doppler study is essentially negative for significant stenosis involving the carotid arteries please call patient

## 2015-11-03 ENCOUNTER — Ambulatory Visit (INDEPENDENT_AMBULATORY_CARE_PROVIDER_SITE_OTHER): Payer: Medicare Other | Admitting: Sports Medicine

## 2015-11-03 ENCOUNTER — Encounter: Payer: Self-pay | Admitting: Sports Medicine

## 2015-11-03 DIAGNOSIS — E1151 Type 2 diabetes mellitus with diabetic peripheral angiopathy without gangrene: Secondary | ICD-10-CM

## 2015-11-03 DIAGNOSIS — E0849 Diabetes mellitus due to underlying condition with other diabetic neurological complication: Secondary | ICD-10-CM

## 2015-11-03 DIAGNOSIS — M79676 Pain in unspecified toe(s): Secondary | ICD-10-CM | POA: Diagnosis not present

## 2015-11-03 DIAGNOSIS — Q828 Other specified congenital malformations of skin: Secondary | ICD-10-CM | POA: Diagnosis not present

## 2015-11-03 DIAGNOSIS — B351 Tinea unguium: Secondary | ICD-10-CM | POA: Diagnosis not present

## 2015-11-03 NOTE — Progress Notes (Signed)
Patient ID: Kristina Summers, female   DOB: 1935-10-04, 80 y.o.   MRN: [id number] Subjective: Kristina Summers is a 80 y.o. female patient with history of diabetes who presents to office today complaining of long, painful nails and callus  while ambulating in shoes; unable to trim. Patient states that the glucose reading this morning was '100mg'$ /dl. Patient denies any new changes in medication or new problems. Patient denies any new cramping, numbness, burning or tingling in the legs.  Admits to history of stroke, 1 year ago and has vascular follow up next month.   Patient Active Problem List   Diagnosis Date Noted  . CVA (cerebral infarction) 09-26-2014  . Cerebral embolism with cerebral infarction ([place]) 09-26-2014  . Fall   . Stroke ([place])   . UTI (lower urinary tract infection) 05/29/2014  . Fracture of distal end of right fibula 05/29/2014  . Rhabdomyolysis 05/29/2014  . Transaminitis 05/29/2014  . Weakness of both lower extremities 01/11/2014  . Pain of left lower extremity- and Right 01/11/2014  . Aftercare following surgery of the circulatory system, [place] 01/11/2014  . Peripheral vascular disease, unspecified ([place]) 01/11/2014  . Cellulitis of great toe, right 08/23/2013  . CAD (coronary artery disease)   . Hypertensive heart disease without CHF   . Paroxysmal atrial fibrillation   . Diabetes mellitus type 2 with peripheral artery disease ([place])   . Obesity (BMI 30-39.9)   . Hyperlipidemia   . Atherosclerosis of native arteries of extremity with intermittent claudication ([place])   . History of GI bleed 04/21/2009  . S/P CABG (coronary artery bypass graft) 11/20/1998   Current Outpatient Prescriptions on File Prior to Visit  Medication Sig Dispense Refill  . acetaminophen (TYLENOL) 325 MG tablet Take 2 tablets (650 mg total) by mouth every 6 (six) hours as needed for mild pain (or Fever >/= 101). 60 tablet 1  . amLODipine (NORVASC) 5 MG tablet Take 1 tablet by mouth daily.    Marland Kitchen apixaban (ELIQUIS)  5 MG TABS tablet Take 1 tablet (5 mg total) by mouth 2 (two) times daily. 60 tablet 0  . atorvastatin (LIPITOR) 80 MG tablet Take 1 tablet (80 mg total) by mouth daily. 30 tablet 0  . docusate sodium (COLACE) 100 MG capsule Take 1 capsule (100 mg total) by mouth 2 (two) times daily. 10 capsule 0  . furosemide (LASIX) 40 MG tablet Take 1 tablet (40 mg total) by mouth daily. 30 tablet   . HUMULIN R 100 UNIT/ML injection Inject 5 Units into the skin at bedtime.     . insulin NPH Human (HUMULIN N,NOVOLIN N) 100 UNIT/ML injection Inject 0.38 mLs (38 Units total) into the skin 2 (two) times daily. 38 in am and 34 at night (Patient taking differently: Inject 38 Units into the skin 2 (two) times daily. 37 in am and 32 at night) 10 mL 11  . losartan (COZAAR) 50 MG tablet Take 1 tablet (50 mg total) by mouth daily.    . metoprolol tartrate (LOPRESSOR) 25 MG tablet Take 1 tablet by mouth 2 (two) times daily.    . Multiple Vitamins-Minerals (ONE-A-DAY 50 PLUS PO) Take 1 tablet by mouth daily.    Marland Kitchen NITROSTAT 0.4 MG SL tablet Place 1 tablet under the tongue every 5 (five) minutes x 3 doses as needed. Chest pain  1   No current facility-administered medications on file prior to visit.   Allergies  Allergen Reactions  . Morphine And Related Nausea And Vomiting    Chest  pain    . Lisinopril Cough  . Sertraline Hcl     REACTION: nausea  . Zoloft [Sertraline Hcl] Other (See Comments)    Nausea     No results found for this or any previous visit (from the past 2160 hour(s)).  Objective: General: Patient is awake, alert, and oriented x 3 and in no acute distress.  Integument: Skin is warm, dry and supple bilateral. Nails are tender, long, thickened and  dystrophic with subungual debris, consistent with onychomycosis, 1-5 bilateral. No signs of infection. + callus sub met 5 bilateral without infection. Remaining integument unremarkable.  Vasculature:  Dorsalis Pedis pulse 0/4 bilateral. Posterior Tibial  pulse  0/4 bilateral. No ischemia or gangrene. Capillary fill time <5 sec 1-5 bilateral. No hair growth to the level of the digits. Temperature gradient within normal limits. No varicosities present bilateral. No edema present bilateral.   Neurology: The patient has diminished sensation measured with a 5.07/10g Semmes Weinstein Monofilament at all pedal sites bilateral . Vibratory sensation absent bilateral with tuning fork. No Babinski sign present bilateral.   Musculoskeletal: No symptomatic pedal deformities noted bilateral. Muscular strength 4/5 in all lower extremity muscular groups bilateral without pain on range of motion . No tenderness with calf compression bilateral.  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 -Discussed and educated patient on diabetic foot care, especially with  regards to the vascular, neurological and musculoskeletal systems.  -Stressed the importance of good glycemic control and the detriment of not  controlling glucose levels in relation to the foot. -Mechanically debrided callus x2 using sterile chisel blade and all nails 1-5 bilateral using sterile nail nipper and filed with dremel without incident  -Continue with vascular follow up as scheduled -[employer] step diabetic shoe order form was completed; office to contact primary care for approval/ certification;  Office to arrange shoe fitting and dispensing. -Answered all patient questions -Patient to return  in 3 months for at risk foot care -Patient advised to call the office if any problems or questions arise in the meantime.  Landis Martins, DPM

## 2015-11-14 ENCOUNTER — Encounter: Payer: Self-pay | Admitting: Vascular Surgery

## 2015-11-18 NOTE — Progress Notes (Signed)
Established Intermittent Claudication  History of Present Illness  Kristina Summers is a 80 y.o. ([birth year]) female who presents with chief complaint: routine follow-up.  Pt's last follow up was in 01/11/14 with my NP.  Prior procedures include: 1. L CIA PTA+S (04/01/11) 2. L SFA OA+PTA (06/17/11) 3. L distal SFA OA+PTA (06/17/11)  Since her prior visit, pt has fallen and been found down 05/29/14.  She was found to a small L MCA territory CVA and displaced R distal fib fx.  She also has become legally blind.  The patient's leg symptoms have not progressed.  The patient's symptoms are: some aching in legs not associated with walking.  The patient denies any residual motor sx from her CVA but does note some problems with word finding.  Her visual loss has worsened.  The patient's treatment regimen currently included: maximal medical management.  Her ambulation is limited by her vision and some deconditioning.  The patient's PMH, PSH, and SH, and FamHx are unchanged from 01/11/14.  Current Outpatient Prescriptions  Medication Sig Dispense Refill  . acetaminophen (TYLENOL) 325 MG tablet Take 2 tablets (650 mg total) by mouth every 6 (six) hours as needed for mild pain (or Fever >/= 101). 60 tablet 1  . amLODipine (NORVASC) 5 MG tablet Take 1 tablet by mouth daily.    Marland Kitchen apixaban (ELIQUIS) 5 MG TABS tablet Take 1 tablet (5 mg total) by mouth 2 (two) times daily. 60 tablet 0  . atorvastatin (LIPITOR) 80 MG tablet Take 1 tablet (80 mg total) by mouth daily. 30 tablet 0  . docusate sodium (COLACE) 100 MG capsule Take 1 capsule (100 mg total) by mouth 2 (two) times daily. 10 capsule 0  . furosemide (LASIX) 40 MG tablet Take 1 tablet (40 mg total) by mouth daily. 30 tablet   . HUMULIN R 100 UNIT/ML injection Inject 5 Units into the skin at bedtime.     . insulin NPH Human (HUMULIN N,NOVOLIN N) 100 UNIT/ML injection Inject 0.38 mLs (38 Units total) into the skin 2 (two) times daily. 38 in am and 34 at night  (Patient taking differently: Inject 38 Units into the skin 2 (two) times daily. 37 in am and 32 at night) 10 mL 11  . losartan (COZAAR) 50 MG tablet Take 1 tablet (50 mg total) by mouth daily.    . metoprolol tartrate (LOPRESSOR) 25 MG tablet Take 1 tablet by mouth 2 (two) times daily.    . Multiple Vitamins-Minerals (ONE-A-DAY 50 PLUS PO) Take 1 tablet by mouth daily.    Marland Kitchen NITROSTAT 0.4 MG SL tablet Place 1 tablet under the tongue every 5 (five) minutes x 3 doses as needed. Chest pain  1   No current facility-administered medications for this visit.     Allergies  Allergen Reactions  . Morphine And Related Nausea And Vomiting    Chest pain    . Lisinopril Cough  . Sertraline Hcl     REACTION: nausea  . Zoloft [Sertraline Hcl] Other (See Comments)    Nausea     On ROS today: some expressive aphasia, exertion dyspnea.   Physical Examination  Vitals:   11/20/15 1556 11/20/15 1601  BP: (!) 175/86 (!) 167/74  Pulse: 73 68  Resp: 16   Temp: 98.6 F (37 C)   SpO2: 96%   Weight: 208 lb (94.3 kg)   Height: 5\' 6"  (1.676 m)    Body mass index is 33.57 kg/m.  General: A&O x 3, WD,  mildly obese,   Pulmonary: Sym exp, good air movt, CTAB, no rales, rhonchi, & wheezing  Cardiac: RRR, Nl S1, S2, no Murmurs, rubs or gallops  Vascular: Vessel Right Left  Radial Palpable Palpable  Brachial Palpable Palpable  Carotid Palpable, without bruit Palpable, without bruit  Aorta Not palpable N/A  Femoral Palpable Palpable  Popliteal Not palpable Not palpable  PT Not Palpable Not Palpable  DP Not Palpable Not Palpable   Gastrointestinal: soft, NTND, no G/R, no HSM, no masses, no CVAT B  Musculoskeletal: M/S 5/5 throughout , Extremities without ischemic changes, healed laceration R distal foot, BLE LDS, mod sized varicosities BLE  Neurologic: Pain and light touch intact in extremities , Motor exam as listed above   Non-Invasive Vascular Imaging ABI (Date: 11/18/2015)  R:   ABI:  1.13 (0.91),   DP: bi,   PT: bi,   TBI: 0.49  L:   ABI: 0.88 (1.01),   DP: bi,   PT: bi,   TBI: 0.62   Medical Decision Making  Kristina Summers is a 80 y.o. female who presents with: h/o bilateral leg intermittent claudication without evidence of critical limb ischemia, BLE CVI, s/p R CIA PTA+S, L SFA OA+PTA   Pt is asx currently from her PAD likely due to limited ambulation due to her other functional limitations.  Based on the patient's vascular studies and examination, I have offered the patient: q3 month Aortoiliac duplex, BLE ABI, and LLE arterial duplex.  I discussed in depth with the patient the nature of atherosclerosis, and emphasized the importance of maximal medical management including strict control of blood pressure, blood glucose, and lipid levels, antiplatelet agents, obtaining regular exercise, and cessation of smoking.    The patient is aware that without maximal medical management the underlying atherosclerotic disease process will progress, limiting the benefit of any interventions. The patient is currently on a statin: Lipitor. The patient is currently not on an anti-platelet: as on anticoagulation Eliquis.  Thank you for allowing [place] to participate in this patient's care.   Adele Barthel, MD, FACS [facility] Office: [phone] Pager: [phone]

## 2015-11-20 ENCOUNTER — Encounter: Payer: Self-pay | Admitting: Vascular Surgery

## 2015-11-20 ENCOUNTER — Ambulatory Visit (HOSPITAL_COMMUNITY)
Admission: RE | Admit: 2015-11-20 | Discharge: 2015-11-20 | Disposition: A | Discharge status: Home / Self Care | Payer: Medicare Other | Source: Ambulatory Visit | Attending: Vascular Surgery | Admitting: Vascular Surgery

## 2015-11-20 ENCOUNTER — Ambulatory Visit (INDEPENDENT_AMBULATORY_CARE_PROVIDER_SITE_OTHER): Payer: Medicare Other | Admitting: Vascular Surgery

## 2015-11-20 VITALS — BP 167/74 | HR 68 | Temp 98.6°F | Resp 16 | Ht 66.0 in | Wt 208.0 lb

## 2015-11-20 DIAGNOSIS — R0989 Other specified symptoms and signs involving the circulatory and respiratory systems: Secondary | ICD-10-CM | POA: Diagnosis present

## 2015-11-20 DIAGNOSIS — I70213 Atherosclerosis of native arteries of extremities with intermittent claudication, bilateral legs: Secondary | ICD-10-CM | POA: Diagnosis not present

## 2015-11-20 DIAGNOSIS — E119 Type 2 diabetes mellitus without complications: Secondary | ICD-10-CM | POA: Insufficient documentation

## 2015-11-20 DIAGNOSIS — K219 Gastro-esophageal reflux disease without esophagitis: Secondary | ICD-10-CM | POA: Insufficient documentation

## 2015-11-20 DIAGNOSIS — Z48812 Encounter for surgical aftercare following surgery on the circulatory system: Secondary | ICD-10-CM | POA: Diagnosis not present

## 2015-11-20 DIAGNOSIS — I2510 Atherosclerotic heart disease of native coronary artery without angina pectoris: Secondary | ICD-10-CM | POA: Diagnosis not present

## 2015-11-20 DIAGNOSIS — R938 Abnormal findings on diagnostic imaging of other specified body structures: Secondary | ICD-10-CM | POA: Insufficient documentation

## 2015-11-20 DIAGNOSIS — E785 Hyperlipidemia, unspecified: Secondary | ICD-10-CM | POA: Insufficient documentation

## 2015-11-20 DIAGNOSIS — I10 Essential (primary) hypertension: Secondary | ICD-10-CM | POA: Diagnosis not present

## 2015-11-20 DIAGNOSIS — I739 Peripheral vascular disease, unspecified: Secondary | ICD-10-CM | POA: Insufficient documentation

## 2015-11-20 NOTE — Addendum Note (Signed)
Addended by: Mena Goes on: 11/20/2015 04:40 PM   Modules accepted: Orders

## 2015-11-21 ENCOUNTER — Ambulatory Visit: Payer: Medicare Other | Admitting: Vascular Surgery

## 2016-01-12 ENCOUNTER — Encounter: Payer: Self-pay | Admitting: Neurology

## 2016-01-12 ENCOUNTER — Ambulatory Visit (INDEPENDENT_AMBULATORY_CARE_PROVIDER_SITE_OTHER): Payer: Medicare Other | Admitting: Neurology

## 2016-01-12 DIAGNOSIS — I70213 Atherosclerosis of native arteries of extremities with intermittent claudication, bilateral legs: Secondary | ICD-10-CM

## 2016-01-12 DIAGNOSIS — R29818 Other symptoms and signs involving the nervous system: Secondary | ICD-10-CM

## 2016-01-12 DIAGNOSIS — R269 Unspecified abnormalities of gait and mobility: Secondary | ICD-10-CM

## 2016-01-12 DIAGNOSIS — R2689 Other abnormalities of gait and mobility: Secondary | ICD-10-CM | POA: Diagnosis not present

## 2016-01-12 DIAGNOSIS — R51 Headache: Secondary | ICD-10-CM

## 2016-01-12 DIAGNOSIS — I63412 Cerebral infarction due to embolism of left middle cerebral artery: Secondary | ICD-10-CM

## 2016-01-12 DIAGNOSIS — R519 Headache, unspecified: Secondary | ICD-10-CM

## 2016-01-12 NOTE — Patient Instructions (Addendum)
Remember to drink plenty of fluid, eat healthy meals and do not skip any meals. Try to eat protein with a every meal and eat a healthy snack such as fruit or nuts in between meals. Try to keep a regular sleep-wake schedule and try to exercise daily, particularly in the form of walking, 20-30 minutes a day, if you can.   As far as your medications are concerned, I would like to suggest: continue current medications   As far as diagnostic testing: Labs, physical therapy  I would like to see you back in 6 months, sooner if we need to. Please call us with any interim questions, concerns, problems, updates or refill requests.   My clinical assistant and will answer any of your questions and relay your messages to me and also relay most of my messages to you.   Our phone number is [phone]. We also have an after hours call service for urgent matters and there is a physician on-call for urgent questions. For any emergencies you know to call 911 or go to the nearest emergency room

## 2016-01-12 NOTE — Progress Notes (Signed)
[facility]    Provider:  [name] Referring Provider: Rogers Blocker, MD Primary Care Physician:  Rogers Blocker, MD   CC:  Stroke  Interval history 01/12/2016: Her eye sight is not good and she is getting some headache on top of her head. Her left eye is a little foggy. Started months ago. She has seen Dr. Radene Ou about this. Dr. Radene Ou is a retina specialist. No headache today. No other pain, no jaw pain, no shoulder pain, no fevers, no chewing difficulty. Headaches are dull on the top of her head. The headache resolves on its own. Will check a crp and esr today. Headache just in the last few months. She is taking the Eliquis every day no problems at all. She feels like her speech is slowly getting better. She had speech therapy. Her balance is off. No new weakness or other symptoms.   HPI:  Kristina Summers is a 80 y.o. female here as a follow up. She was admitted 6 weeks ago for difficulty speaking. Kristina Summers is a 80 y.o. female with a history of afib s/p ablation not on [facility] procedures who presented following a fall with ankle fracture to the ED. She is a care facility. The morning she was brought to the ED, she was "spacey" when the nurse spoke with her  and seemed to have some trouble finding her words, but was able to respond. When her daughter saw her around 8am, she noticed she was off, but thought it was pain medication. She subsequently saw her again around 9:45 am and patient was still having difficulty speaking. MRI of the head revealed an acute, small left MCA infarct.  At [facility] hospital, NIHSS: 6(2 for questions, 1 for gaze preference, 2 for aphasia, 1 for dysarthria). Patient was not administered TPA secondary to delay in arrival. She was admitted for further evaluation and treatment.   She is feeling better. Her speech is improving, here with daughter and almost back to her baseline. Daughter agrees. She says she went to the emergency room for her ankle and while there  they noticed aphasia. That is what led to the admission.  Reviewed notes, labs and imaging from outside physicians, which showed:  Mr Brain Wo Contrast 05/30/2014  MRI HEAD:  Acute small LEFT middle cerebral artery territory infarct (LEFT frontal lobe). Otherwise normal MRI of the brain for age.  MRA HEAD:  Moderately motion degraded examination without central large vessel occlusion. Distal thromboembolic disease would not be appreciated on this motion degraded examination.    Carotid Doppler There is 1-39% bilateral ICA stenosis. Vertebral artery flow is antegrade.    Review of Systems: Patient complains of symptoms per HPI as well as the following symptoms:murmur, swelling in legs, memory loss. Pertinent negatives per HPI. All others negative.    Social History   Social History  . Marital status: Divorced    Spouse name: N/A  . Number of children: 3  . Years of education: 12   Occupational History  . Retired    Social History Main Topics  . Smoking status: Former Smoker    Packs/day: 2.00    Years: 30.00    Types: Cigarettes    Quit date: 04/19/1990  . Smokeless tobacco: Never Used     Comment: stopped smoking cigarettes 1991  . Alcohol use No  . Drug use: No  . Sexual activity: No   Other Topics Concern  . Not on file   Social History Narrative  Divorced.  Native of [place].  Formerly worked as Education administrator person.   Caffeine use: drinks decaf tea and coffee       Family History  Problem Relation Age of Onset  . Other Brother     intestinal blockage  . Diabetes Brother     Past Medical History:  Diagnosis Date  . Angina   . Arthritis   . Atrial fibrillation ([place])   . CAD (coronary artery disease)   . Carotid artery occlusion   . Diabetes mellitus   . Fibromyalgia   . GERD (gastroesophageal reflux disease)   . GI bleed 1/11  . Hyperlipidemia   . Hypertension   . Myocardial infarction ([place]) ~ 1991  . Normal nuclear stress test  03/18/2011  . Obesity   . Peripheral vascular disease ([place])   . Stroke [facility])    May 24, 2014  . UTI (lower urinary tract infection) Sept. 2015    Past Surgical History:  Procedure Laterality Date  . ABDOMINAL AORTAGRAM N/A 04/01/2011   Procedure: ABDOMINAL AORTAGRAM;  Surgeon: Conrad [place], MD;  Location: [facility] CATH LAB;  Service: Cardiovascular;  Laterality: N/A;  . ANGIOPLASTY  06/17/11   Left leg common femoral artery cannulation under u/s Left leg runoff  . CATARACT EXTRACTION W/ INTRAOCULAR LENS  IMPLANT, BILATERAL  2004-2005  . CORONARY ARTERY BYPASS GRAFT  2000   CABG X5  . EYE SURGERY    . LOWER EXTREMITY ANGIOGRAM Bilateral 04/01/2011   Procedure: LOWER EXTREMITY ANGIOGRAM;  Surgeon: Conrad [place], MD;  Location: [facility] CATH LAB;  Service: Cardiovascular;  Laterality: Bilateral;  . LOWER EXTREMITY ANGIOGRAM Left 06/17/2011   Procedure: LOWER EXTREMITY ANGIOGRAM;  Surgeon: Conrad [place], MD;  Location: [facility] CATH LAB;  Service: Cardiovascular;  Laterality: Left;  . LOWER EXTREMITY ANGIOGRAM N/A 11/18/2011   Procedure: LOWER EXTREMITY ANGIOGRAM;  Surgeon: Conrad [place], MD;  Location[facility] CATH LAB;  Service: Cardiovascular;  Laterality: N/A;  . PERCUTANEOUS STENT INTERVENTION Right 04/01/2011   Procedure: PERCUTANEOUS STENT INTERVENTION;  Surgeon: Conrad [place], MD;  Location: [facility] CATH LAB;  Service: Cardiovascular;  Laterality: Right;  rt iliac stent  . PERIPHERAL ARTERIAL STENT GRAFT  04/01/11   right common iliac  . TRIGGER FINGER RELEASE  1996   right thumb    Current Outpatient Prescriptions  Medication Sig Dispense Refill  . acetaminophen (TYLENOL) 325 MG tablet Take 2 tablets (650 mg total) by mouth every 6 (six) hours as needed for mild pain (or Fever >/= 101). 60 tablet 1  . amLODipine (NORVASC) 5 MG tablet Take 1 tablet by mouth daily.    Marland Kitchen apixaban (ELIQUIS) 5 MG TABS tablet Take 1 tablet (5 mg total) by mouth 2 (two) times daily. 60 tablet 0  . atorvastatin (LIPITOR) 80 MG  tablet Take 1 tablet (80 mg total) by mouth daily. 30 tablet 0  . furosemide (LASIX) 40 MG tablet Take 1 tablet (40 mg total) by mouth daily. (Patient taking differently: Take 40 mg by mouth 2 (two) times daily. ) 30 tablet   . insulin NPH Human (HUMULIN N,NOVOLIN N) 100 UNIT/ML injection Inject 0.38 mLs (38 Units total) into the skin 2 (two) times daily. 38 in am and 34 at night (Patient taking differently: Inject 37 Units into the skin daily before breakfast. 37 in am and 32 at night) 10 mL 11  . losartan (COZAAR) 50 MG tablet Take 1 tablet (50 mg total) by mouth daily. (Patient taking differently: Take 50 mg  by mouth 2 (two) times daily. )    . metoprolol tartrate (LOPRESSOR) 25 MG tablet Take 1 tablet by mouth 2 (two) times daily.    . Multiple Vitamins-Minerals (ONE-A-DAY 50 PLUS PO) Take 1 tablet by mouth daily.    Marland Kitchen NITROSTAT 0.4 MG SL tablet Place 1 tablet under the tongue every 5 (five) minutes x 3 doses as needed. Chest pain  1   No current facility-administered medications for this visit.     Allergies as of 01/12/2016 - Review Complete 01/12/2016  Allergen Reaction Noted  . Morphine and related Nausea And Vomiting 11/18/2011  . Lisinopril Cough 03/04/2011  . Sertraline hcl  09/23/2008  . Zoloft [sertraline hcl] Other (See Comments) 11/27/2012    Vitals: There were no vitals taken for this visit. Last Weight:  Wt Readings from Last 1 Encounters:  11/20/15 208 lb (94.3 kg)   Last Height:   Ht Readings from Last 1 Encounters:  11/20/15 '5\' 6"'$  (1.676 m)       PHYSICAL EXAM Obese middle aged Caucasian lady not in distress. . Afebrile. Head is nontraumatic. Neck is supple without bruit. Cardiac exam no murmur or gallop. Lungs are clear to auscultation. Distal pulses are well felt except in RLE where bandage for recent fracture limits evaluation. Neurological Exam : Awake alert oriented 2. Mild expressive aphasia(improved since admission). Mild dysarthria. Extraocular  movements are full range without nystagmus. VFF.  Fundi were not visualized. Mild right lower facial weakness. Tongue midline. 5/5 strength. Mild diminished fine finger movements on the right.  Normal sensation to LT. Deep tendon reflexes are symmetric. Plantars are downgoing.    She can stand without using hands. Difficulty with Tandem, toe and cannot tandem. Romberg negative.    ASSESSMENT/PLAN Ms. Kristina Summers is a 80 y.o. female with history of poorly controlled diabetes, coronary artery disease status post CABG, atrial fibrillation not on anticoagulation due to history of bleeding, history a few falls presenting to [facility] following a fall with aphasia. She did not receive IV t-PA due to fall. She had a small left MCA frontal lobe infarct secondary to atrial fibrillation with resultant expressive aphasia that is much improved.   She has c/o headache today and imbalance.   - C/w lipitor, Eliquis for stroke prevention. Continue close follow up for other vascular risk factors such as diabetes and HTN.  - labs for headache esr and crp - physical therapy for balance and gait safety   LDL 48, goal < 70  Continue statin at discharge  HgbA1c 10.4, goal < 7.0, needs follow up with PCP, discussed   Former Cigarette smoker, quit smoking in 1992  Obesity, Body mass index is 31.81 kg/(m^2).   Coronary artery disease - s/p CABG w/ troponin elevation (non ischemic)  PAD  PVD  I had a long d/w patient about her recent stroke, risk for recurrent stroke/TIAs, personally independently reviewed imaging studies and stroke evaluation results and answered questions.Continue Eliquis and statin for secondary stroke prevention and maintain strict control of hypertension with blood pressure goal below 130/90, diabetes with hemoglobin A1c goal below 6.5% and lipids with LDL cholesterol goal below 70 mg/dL.Patient has h/o statin intolerance hence recommend new PCSK9 inhibitor like Praluent.  Recommend she see her primary MD to get prescription. I also advised the patient to eat a healthy diet with plenty of whole grains, cereals, fruits and vegetables, exercise regularly and maintain ideal body weight .Followup in the future with me in 2 months or  call earlier if necessary.   Sarina Ill, MD  [facility] [address] [place], [place] [address]  Phone [phone] Fax [phone]  A total of 30 minutes was spent face-to-face with this patient. Over half this time was spent on counseling patient on the embolic stroke, imbalance, headche diagnosis and different therapeutic options available.

## 2016-01-13 ENCOUNTER — Telehealth: Payer: Self-pay | Admitting: *Deleted

## 2016-01-13 LAB — C-REACTIVE PROTEIN: CRP: 4.6 mg/L (ref 0.0–4.9)

## 2016-01-13 LAB — SEDIMENTATION RATE: Sed Rate: 24 mm/hr (ref 0–40)

## 2016-01-13 NOTE — Telephone Encounter (Signed)
Called and spoke to patient about normal labs per [name]. Pt verbalized understanding and has no further questions at this time.

## 2016-01-13 NOTE — Telephone Encounter (Signed)
-----   Message from Melvenia Beam, MD sent at 01/13/2016  8:02 AM EDT ----- Labs normal

## 2016-01-23 ENCOUNTER — Ambulatory Visit: Payer: Medicare Other | Attending: Neurology

## 2016-01-23 DIAGNOSIS — R2689 Other abnormalities of gait and mobility: Secondary | ICD-10-CM | POA: Insufficient documentation

## 2016-01-23 DIAGNOSIS — R519 Headache, unspecified: Secondary | ICD-10-CM

## 2016-01-23 DIAGNOSIS — M6281 Muscle weakness (generalized): Secondary | ICD-10-CM

## 2016-01-23 DIAGNOSIS — R51 Headache: Secondary | ICD-10-CM | POA: Insufficient documentation

## 2016-01-23 NOTE — Therapy (Signed)
[facility] [address] [place], [place], [address] Phone: [phone]   Fax:  [phone]  Physical Therapy Evaluation  Patient Details  Name: Kristina Summers MRN: [id number] Date of Birth: Jun 28, 1935 Referring Provider: [name]  Encounter Date: 01/23/2016      PT End of Session - 01/23/16 1601    Visit Number 1   Number of Visits 17   Date for PT Re-Evaluation 03/23/16   Authorization Type G-CODE EVERY 10TH VISIT.   PT Start Time 0803   PT Stop Time 0846   PT Time Calculation (min) 43 min   Equipment Utilized During Treatment Gait belt  min A- S prn   Activity Tolerance Patient tolerated treatment well   Behavior During Therapy WFL for tasks assessed/performed      Past Medical History:  Diagnosis Date  . Angina   . Arthritis   . Atrial fibrillation ([place])   . CAD (coronary artery disease)   . Carotid artery occlusion   . Diabetes mellitus   . Fibromyalgia   . GERD (gastroesophageal reflux disease)   . GI bleed 1/11  . Hyperlipidemia   . Hypertension   . Myocardial infarction ~ 1991  . Normal nuclear stress test 03/18/2011  . Obesity   . Peripheral vascular disease ([place])   . Stroke [facility])    May 24, 2014  . UTI (lower urinary tract infection) Sept. 2015    Past Surgical History:  Procedure Laterality Date  . ABDOMINAL AORTAGRAM N/A 04/01/2011   Procedure: ABDOMINAL AORTAGRAM;  Surgeon: Conrad [place], MD;  Location: [facility] CATH LAB;  Service: Cardiovascular;  Laterality: N/A;  . ANGIOPLASTY  06/17/11   Left leg common femoral artery cannulation under u/s Left leg runoff  . CATARACT EXTRACTION W/ INTRAOCULAR LENS  IMPLANT, BILATERAL  2004-2005  . CORONARY ARTERY BYPASS GRAFT  2000   CABG X5  . EYE SURGERY    . LOWER EXTREMITY ANGIOGRAM Bilateral 04/01/2011   Procedure: LOWER EXTREMITY ANGIOGRAM;  Surgeon: Conrad [place], MD;  Location: [facility] CATH LAB;  Service: Cardiovascular;  Laterality: Bilateral;  .  LOWER EXTREMITY ANGIOGRAM Left 06/17/2011   Procedure: LOWER EXTREMITY ANGIOGRAM;  Surgeon: Conrad [place], MD;  Location: [facility] CATH LAB;  Service: Cardiovascular;  Laterality: Left;  . LOWER EXTREMITY ANGIOGRAM N/A 11/18/2011   Procedure: LOWER EXTREMITY ANGIOGRAM;  Surgeon: Conrad [place], MD;  Location: [facility] CATH LAB;  Service: Cardiovascular;  Laterality: N/A;  . PERCUTANEOUS STENT INTERVENTION Right 04/01/2011   Procedure: PERCUTANEOUS STENT INTERVENTION;  Surgeon: Conrad [place], MD;  Location: [facility] CATH LAB;  Service: Cardiovascular;  Laterality: Right;  rt iliac stent  . PERIPHERAL ARTERIAL STENT GRAFT  04/01/11   right common iliac  . TRIGGER FINGER RELEASE  1996   right thumb    There were no vitals filed for this visit.       Subjective Assessment - 01/23/16 0808    Subjective Pt reports her balance has been getting progressively worse after she finished 7 weeks of rehab at [facility], last year after falling and sustaining R fibula fx. In 05/2014, Pt reports she lost her balance while getting in/out of the shower (05/2015 per pt but chart states 05/24/2014), and hit her head on the commode but reports she didn't hit it hard. She could not get up due to R ankle soreness, and was unable to pull the help cord (independent living facility). Her neighbor had the maintenance man check on pt and  they called EMS. Pt reported she then experienced slurred speech upon arrival to the hospital. She was diagnosed with a CVA at that time. Pt reported her R ankle was broken, but has not current weight bearing precuations. Pt reports intermittent HAs (1-3 days/week), but is more concerned with her balance issues. Pt also reports occasional episodes of lightheaded/woozy while seated and standing (like she's going to faint). Pt felt like she was better after rehab at [facility] but has experienced a decline in balance over last six months.    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 No/denies            [facility] PT Assessment - 01/23/16 0824      Assessment   Medical Diagnosis Nonintractable episodic headache, unspecified headache type, balance disorder, abnormality of gait, imbalance   Referring Provider [name]   Onset Date/Surgical Date 07/24/15  balance worsening over last 6 months   Hand Dominance Right   Prior Therapy none for balance but had SNF s/p R fibular fx 05/2014     Precautions   Precautions Fall   Precaution Comments based on TUG time     Restrictions   Weight Bearing Restrictions No     Balance Screen   Has the patient fallen in the past 6 months No  none in last 6 months but staggers   Has the patient had a decrease in activity level because of a fear of falling?  Yes   Is the patient reluctant to leave their home because of a fear of falling?  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 light touch in L hand and pt  reports its usually cold. Pt reports constant L hand N/T from DM.     Coordination   Gross Motor Movements are Fluid and Coordinated Yes   Fine Motor Movements are Fluid and Coordinated No  Decr. L fingers to thumb 2/2 thumb pain     Posture/Postural Control   Posture/Postural Control Postural limitations   Postural Limitations Forward head     ROM / Strength   AROM / PROM / Strength AROM;Strength     AROM   Overall AROM  Within functional limits for tasks performed   Overall AROM Comments B UE and LE WFL.     Strength   Overall Strength  Deficits   Overall Strength Comments B LE: hip flex: 4/5, knee ext: 4+/5, knee flex: 3+/5, ankle DF: 4/5. Seated gross hip abd/add: 4-/5.     Transfers   Transfers Sit to Stand;Stand to Sit   Sit to Stand 5: Supervision;With upper extremity assist;From chair/3-in-1   Stand to Sit 5: Supervision;With upper extremity assist;To chair/3-in-1     Ambulation/Gait   Ambulation/Gait Yes   Ambulation/Gait Assistance 5: Supervision   Ambulation/Gait Assistance Details No overt LOB, but hesitation during turns.   Ambulation Distance (Feet) 75 Feet   Assistive device None   Gait Pattern Step-through pattern;Decreased stride length;Decreased weight shift to right;Decreased dorsiflexion - right;Decreased trunk rotation   Ambulation Surface Level;Indoor   Gait velocity 2.14ft/sec.     Balance   Balance Assessed Yes     Static Standing Balance   Static Standing - Balance Support No upper extremity supported   Static Standing - Level of Assistance 4: Min assist;5: Stand by assistance   Static Standing - Comment/# of Minutes Pt able to stand with feet together eyes open but required assist with eyes closed for > 5sec. and incr. postural sway.     Standardized Balance Assessment   Standardized Balance Assessment Timed Up and Go Test     Timed Up and Go Test   TUG Normal TUG   Normal TUG (seconds) 14.8  no AD                            PT Education - 01/23/16 1601    Education provided Yes   Education Details PT educated pt on frequency/duration and outcome measure results.    Person(s) Educated Patient   Methods Explanation   Comprehension Verbalized understanding          PT Short Term Goals - 01/23/16 1608      PT SHORT TERM GOAL #1   Title Pt will be IND in HEP to improve strength and balance. TARGET DATE FOR ALL STGS: 02/20/16   Status New     PT SHORT TERM GOAL #2   Title Perform BERG and write goals prn.   Status New     PT SHORT TERM GOAL #3   Title Pt will perform TUG with no AD in</=13.5sec. to decr. falls risk.   Status New     PT SHORT TERM GOAL #4   Title Pt will amb. 500' with LRAD at MOD I level, over even terrain, to improve functional mobility.    Status New           PT Long Term Goals - 01/23/16 1609      PT LONG TERM GOAL #1   Title Pt will amb. 1000' over even/uneven terrain with LRAD in order to improve functional mobility and go to the store. TARGET DATE FOR ALL LTGS: 03/19/16   Status New     PT LONG TERM GOAL #2   Title Pt will be able to perform dynamic standing activities (weight shifting lat/post/ant and reaching forward 5") for 10 minutes in order to perform in band.    Status New     PT LONG TERM GOAL #3   Title Pt will verbalize understanding of fall prevention strategies to reduce falls risk.    Status New               Plan - 01/23/16 1602    Clinical Impression Statement Pt is a pleasant 80y/o female presenting  to OPPT neuro with balance impairments and HAs. Pt denied HA during today's visit. Pt's PMH siginificant for the following: HOH, impaired vision, CABG in 2000, MI in 1991, CAD, a-fib, DM, CVA 05/2015 per pt but chart states 05/2014, Hx of R fibula fracture, arthritis, HTN, hyperlipidemia, angina. Pt presented with the following impairments: gait deviations, impaired balance, impaired strength, decr. endurance, impaired coordination, intermittent  lightheadedness, impaired coordination, impaired cognition, and intermittent HA. Pt's gait speed indicates she is just above the cut-off to be considered a safe community ambulator (pt's gait speed: 2.56ft/sec.). Pt's TUG time indicates pt is at risk for falls. Pt noted to experience incr. postural sway during activities which required incr. vestibular input, such as standing with eyes closed. Pt would benefit from skilled PT to improve safety during functional mobility.    Rehab Potential Good   Clinical Impairments Affecting Rehab Potential HOH, impaired vision, CABG in 2000, MI in 1991, CAD, a-fib, DM, CVA 05/2015 per pt but chart states 05/2014, Hx of R fibula fracture, arthritis, HTN, hyperlipidemia, angina   PT Frequency 2x / week   PT Duration 8 weeks   PT Treatment/Interventions ADLs/Self Care Home Management;Biofeedback;Canalith Repostioning;Neuromuscular re-education;Balance training;Therapeutic exercise;Therapeutic activities;Manual techniques;Functional mobility training;Stair training;Gait training;DME Instruction;Orthotic Fit/Training;Patient/family education;Vestibular   PT Next Visit Plan Perform BERG and write goals prn. Initiate balance and strengthening HEP.   Consulted and Agree with Plan of Care Patient      Patient will benefit from skilled therapeutic intervention in order to improve the following deficits and impairments:  Abnormal gait, Decreased endurance, Decreased knowledge of use of DME, Decreased strength, Pain, Impaired sensation, Decreased balance, Decreased mobility, Impaired vision/preception, Dizziness, Decreased cognition, Decreased coordination, Postural dysfunction (impaired sensation in L hand only)  Visit Diagnosis: Other abnormalities of gait and mobility - Plan: PT plan of care cert/re-cert  Muscle weakness (generalized) - Plan: PT plan of care cert/re-cert  Nonintractable episodic headache, unspecified headache type - Plan: PT plan of care cert/re-cert       G-Codes - 01-31-2016 1612    Functional Assessment Tool Used TUG no AD: 14.8sec.; gait speed no AD: 2.16ft/sec.   Functional Limitation Mobility: Walking and moving around   Mobility: Walking and Moving Around Current Status [phone]) At least 40 percent but less than 60 percent impaired, limited or restricted   Mobility: Walking and Moving Around Goal Status [phone]) At least 1 percent but less than 20 percent impaired, limited or restricted       Problem List Patient Active Problem List   Diagnosis Date Noted  . CVA (cerebral infarction) 08-15-2014  . Cerebral embolism with cerebral infarction ([place]) 08-15-2014  . Fall   . Stroke ([place])   . UTI (lower urinary tract infection) 05/29/2014  . Fracture of distal end of right fibula 05/29/2014  . Rhabdomyolysis 05/29/2014  . Transaminitis 05/29/2014  . Weakness of both lower extremities 01/11/2014  . Pain of left lower extremity- and Right 01/11/2014  . Aftercare following surgery of the circulatory system, [place] 01/11/2014  . Peripheral vascular disease, unspecified 01/11/2014  . Cellulitis of great toe, right 08/23/2013  . CAD (coronary artery disease)   . Hypertensive heart disease without CHF   . Paroxysmal atrial fibrillation   . Diabetes mellitus type 2 with peripheral artery disease ([place])   . Obesity (BMI 30-39.9)   . Hyperlipidemia   . Atherosclerosis of native arteries of extremity with intermittent claudication ([place])   . History of GI bleed 04/21/2009  . S/P CABG (coronary artery bypass  graft) 11/20/1998    Kristina Summers L 01/23/2016, 4:14 PM  [facility] [address] [place], [place], [address] Phone: [phone]   Fax:  [phone]  Name: Kristina Summers MRN: [id number] Date of Birth: [birth year]  Geoffry [place], PT,DPT 01/23/16 4:15 PM Phone: [phone] Fax: [phone]

## 2016-01-30 ENCOUNTER — Ambulatory Visit: Payer: Medicare Other | Admitting: Physical Therapy

## 2016-01-30 DIAGNOSIS — R2689 Other abnormalities of gait and mobility: Secondary | ICD-10-CM | POA: Diagnosis not present

## 2016-01-30 DIAGNOSIS — M6281 Muscle weakness (generalized): Secondary | ICD-10-CM

## 2016-01-30 NOTE — Therapy (Signed)
[facility] [address] [place], [place], [address] Phone: [phone]   Fax:  [phone]  Physical Therapy Treatment  Patient Details  Name: Kristina Summers MRN: [id number] Date of Birth: 1936/01/02 Referring Provider: [name]  Encounter Date: 01/30/2016    Past Medical History:  Diagnosis Date  . Angina   . Arthritis   . Atrial fibrillation ([place])   . CAD (coronary artery disease)   . Carotid artery occlusion   . Diabetes mellitus   . Fibromyalgia   . GERD (gastroesophageal reflux disease)   . GI bleed 1/11  . Hyperlipidemia   . Hypertension   . Myocardial infarction ~ 1991  . Normal nuclear stress test 03/18/2011  . Obesity   . Peripheral vascular disease ([place])   . Stroke [facility])    May 24, 2014  . UTI (lower urinary tract infection) Sept. 2015    Past Surgical History:  Procedure Laterality Date  . ABDOMINAL AORTAGRAM N/A 04/01/2011   Procedure: ABDOMINAL AORTAGRAM;  Surgeon: Conrad [place], MD;  Location: [facility] CATH LAB;  Service: Cardiovascular;  Laterality: N/A;  . ANGIOPLASTY  06/17/11   Left leg common femoral artery cannulation under u/s Left leg runoff  . CATARACT EXTRACTION W/ INTRAOCULAR LENS  IMPLANT, BILATERAL  2004-2005  . CORONARY ARTERY BYPASS GRAFT  2000   CABG X5  . EYE SURGERY    . LOWER EXTREMITY ANGIOGRAM Bilateral 04/01/2011   Procedure: LOWER EXTREMITY ANGIOGRAM;  Surgeon: Conrad [place], MD;  Location: [facility] CATH LAB;  Service: Cardiovascular;  Laterality: Bilateral;  . LOWER EXTREMITY ANGIOGRAM Left 06/17/2011   Procedure: LOWER EXTREMITY ANGIOGRAM;  Surgeon: Conrad [place], MD;  Location: [facility] CATH LAB;  Service: Cardiovascular;  Laterality: Left;  . LOWER EXTREMITY ANGIOGRAM N/A 11/18/2011   Procedure: LOWER EXTREMITY ANGIOGRAM;  Surgeon: Conrad [place], MD;  Location: [facility] CATH LAB;  Service: Cardiovascular;  Laterality: N/A;  . PERCUTANEOUS STENT INTERVENTION Right 04/01/2011   Procedure:  PERCUTANEOUS STENT INTERVENTION;  Surgeon: Conrad [place], MD;  Location: [facility] CATH LAB;  Service: Cardiovascular;  Laterality: Right;  rt iliac stent  . PERIPHERAL ARTERIAL STENT GRAFT  04/01/11   right common iliac  . TRIGGER FINGER RELEASE  1996   right thumb    There were no vitals filed for this visit.      Subjective Assessment - 01/30/16 0808    Subjective She was able to recall the intial eval; she mentioned she is going to the foot clinic to have toe nails tx; left is interferring with walking; wearing large shoes; hurts when she walks; no recent fall; uses furniture for balance; uses walker at times; has to stop 3-4 times when walking a block due to lower back pain   Pain Score 3    Pain Location Back   Pain Descriptors / Indicators Aching   Aggravating Factors  prolonged standing /walking   Pain Relieving Factors stand and rest                                   PT Short Term Goals - 01/23/16 1608      PT SHORT TERM GOAL #1   Title Pt will be IND in HEP to improve strength and balance. TARGET DATE FOR ALL STGS: 02/20/16   Status New     PT SHORT TERM GOAL #2   Title Perform BERG and write goals prn.  Status New     PT SHORT TERM GOAL #3   Title Pt will perform TUG with no AD in</=13.5sec. to decr. falls risk.   Status New     PT SHORT TERM GOAL #4   Title Pt will amb. 500' with LRAD at MOD I level, over even terrain, to improve functional mobility.    Status New           PT Long Term Goals - 01/23/16 1609      PT LONG TERM GOAL #1   Title Pt will amb. 1000' over even/uneven terrain with LRAD in order to improve functional mobility and go to the store. TARGET DATE FOR ALL LTGS: 03/19/16   Status New     PT LONG TERM GOAL #2   Title Pt will be able to perform dynamic standing activities (weight shifting lat/post/ant and reaching forward 5") for 10 minutes in order to perform in band.    Status New     PT LONG TERM GOAL #3   Title  Pt will verbalize understanding of fall prevention strategies to reduce falls risk.    Status New      there ex;  Standing star step with progressively less UE support; instructed for her to do this at home next to kitchen counter; x 10 min a day   Stepping and reaching outside of base of support ; x 20  Had her at the punching bag and she stepped forward as big as she could and touched the bag  Pseudo-yoga progression; Standing reach over head -both arms; then transition to reaching to the floor then reaching out in front and squating then return to reaching over head;  Pt do to this 5 x  3-5 x day         Patient will benefit from skilled therapeutic intervention in order to improve the following deficits and impairments:     Visit Diagnosis: No diagnosis found.     Problem List Patient Active Problem List   Diagnosis Date Noted  . CVA (cerebral infarction) 09-Nov-202016  . Cerebral embolism with cerebral infarction ([place]) 09-Nov-202016  . Fall   . Stroke ([place])   . UTI (lower urinary tract infection) 05/29/2014  . Fracture of distal end of right fibula 05/29/2014  . Rhabdomyolysis 05/29/2014  . Transaminitis 05/29/2014  . Weakness of both lower extremities 01/11/2014  . Pain of left lower extremity- and Right 01/11/2014  . Aftercare following surgery of the circulatory system, [place] 01/11/2014  . Peripheral vascular disease, unspecified 01/11/2014  . Cellulitis of great toe, right 08/23/2013  . CAD (coronary artery disease)   . Hypertensive heart disease without CHF   . Paroxysmal atrial fibrillation   . Diabetes mellitus type 2 with peripheral artery disease ([place])   . Obesity (BMI 30-39.9)   . Hyperlipidemia   . Atherosclerosis of native arteries of extremity with intermittent claudication ([place])   . History of GI bleed 04/21/2009  . S/P CABG (coronary artery bypass graft) 11/20/1998    Rosaura Carpenter D  PT DPT 01/30/2016, 8:43 AM  [facility] [address] [place], [place], [address] Phone: [phone]   Fax:  [phone]  Name: Kristina Summers MRN: [id number] Date of Birth: 10-25-1935

## 2016-02-04 ENCOUNTER — Ambulatory Visit: Payer: Medicare Other

## 2016-02-04 DIAGNOSIS — M6281 Muscle weakness (generalized): Secondary | ICD-10-CM

## 2016-02-04 DIAGNOSIS — R2689 Other abnormalities of gait and mobility: Secondary | ICD-10-CM

## 2016-02-04 NOTE — Patient Instructions (Addendum)
Perform at kitchen sink with chair behind you OR in a corner with a chair in front of you for safety:  Feet Together, Varied Arm Positions - Eyes Closed    Stand with feet together and arms at your side. Close eyes and visualize upright position. Hold _20-30___ seconds. Repeat __3__ times per session. Do __1__ sessions per day.  Copyright  VHI. All rights reserved.  Feet Partial Heel-Toe, Varied Arm Positions - Eyes Open    With eyes open, stand with feet together and right foot partially in front of the other, arms in front of you, look straight ahead at a stationary object. Hold _30___ seconds. Then switch and place left foot partially in front of the other. Repeat _3___ times per session with each foot in front. Do __1__ sessions per day.  Copyright  VHI. All rights reserved.  Single Leg - Eyes Open    Holding support, lift right leg while maintaining balance over other leg. Progress to removing hands from support surface for longer periods of time. Hold__10-20__ seconds. Then switch and stand on the other foot. Repeat __3__ times per session with each foot. Do _1___ sessions per day.  Copyright  VHI. All rights reserved.

## 2016-02-04 NOTE — Therapy (Signed)
[facility] [address] [place], [place], [address] Phone: [phone]   Fax:  [phone]  Physical Therapy Treatment  Patient Details  Name: Kristina Summers MRN: [id number] Date of Birth: 1935-09-11 Referring Provider: [name]  Encounter Date: 02/04/2016      PT End of Session - 02/04/16 1027    Visit Number 3   Number of Visits 17   Date for PT Re-Evaluation 03/23/16   Authorization Type G-CODE EVERY 10TH VISIT.   PT Start Time [phone]   PT Stop Time 0928   PT Time Calculation (min) 42 min   Equipment Utilized During Treatment Gait belt   Activity Tolerance Patient tolerated treatment well   Behavior During Therapy WFL for tasks assessed/performed      Past Medical History:  Diagnosis Date  . Angina   . Arthritis   . Atrial fibrillation ([place])   . CAD (coronary artery disease)   . Carotid artery occlusion   . Diabetes mellitus   . Fibromyalgia   . GERD (gastroesophageal reflux disease)   . GI bleed 1/11  . Hyperlipidemia   . Hypertension   . Myocardial infarction ~ 1991  . Normal nuclear stress test 03/18/2011  . Obesity   . Peripheral vascular disease ([place])   . Stroke [facility])    May 24, 2014  . UTI (lower urinary tract infection) Sept. 2015    Past Surgical History:  Procedure Laterality Date  . ABDOMINAL AORTAGRAM N/A 04/01/2011   Procedure: ABDOMINAL AORTAGRAM;  Surgeon: Conrad Bristow, MD;  Location: [facility] CATH LAB;  Service: Cardiovascular;  Laterality: N/A;  . ANGIOPLASTY  06/17/11   Left leg common femoral artery cannulation under u/s Left leg runoff  . CATARACT EXTRACTION W/ INTRAOCULAR LENS  IMPLANT, BILATERAL  2004-2005  . CORONARY ARTERY BYPASS GRAFT  2000   CABG X5  . EYE SURGERY    . LOWER EXTREMITY ANGIOGRAM Bilateral 04/01/2011   Procedure: LOWER EXTREMITY ANGIOGRAM;  Surgeon: Conrad Brumley, MD;  Location: [facility] CATH LAB;  Service: Cardiovascular;  Laterality: Bilateral;  . LOWER EXTREMITY  ANGIOGRAM Left 06/17/2011   Procedure: LOWER EXTREMITY ANGIOGRAM;  Surgeon: Conrad Fruitland, MD;  Location: [facility] CATH LAB;  Service: Cardiovascular;  Laterality: Left;  . LOWER EXTREMITY ANGIOGRAM N/A 11/18/2011   Procedure: LOWER EXTREMITY ANGIOGRAM;  Surgeon: Conrad Monument, MD;  Location: [facility] CATH LAB;  Service: Cardiovascular;  Laterality: N/A;  . PERCUTANEOUS STENT INTERVENTION Right 04/01/2011   Procedure: PERCUTANEOUS STENT INTERVENTION;  Surgeon: Conrad Weston, MD;  Location: [facility] CATH LAB;  Service: Cardiovascular;  Laterality: Right;  rt iliac stent  . PERIPHERAL ARTERIAL STENT GRAFT  04/01/11   right common iliac  . TRIGGER FINGER RELEASE  1996   right thumb    There were no vitals filed for this visit.      Subjective Assessment - 02/04/16 0849    Subjective Pt denied falls since last visit. Pt reported she has been performing HEP from last session (2 exercises). She has an appt. with her PCP today for a regular f/u.    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 No/denies  [facility] Adult PT Treatment/Exercise - 02/04/16 0851      Standardized Balance Assessment   Standardized Balance Assessment Berg Balance Test     Berg Balance Test   Sit to Stand Able to stand without using hands and stabilize independently   Standing Unsupported Able to stand safely 2 minutes   Sitting with Back Unsupported but Feet Supported on Floor or Stool Able to sit safely and securely 2 minutes   Stand to Sit Sits safely with minimal use of hands   Transfers Able to transfer safely, minor use of hands   Standing Unsupported with Eyes Closed Able to stand 10 seconds  with supervision   Standing Ubsupported with Feet Together Able to place feet together independently and stand for 1 minute with supervision   From Standing, Reach Forward with Outstretched Arm Can reach forward >12 cm safely (5")  5"   From Standing Position, Pick up Object from Floor Able to pick up shoe, needs supervision   From Standing Position, Turn to Look Behind Over each Shoulder Looks behind from both sides and weight shifts well   Turn 360 Degrees Needs close supervision or verbal cueing   Standing Unsupported, Alternately Place Feet on Step/Stool Able to complete 4 steps without aid or supervision   Standing Unsupported, One Foot in Front Able to plae foot ahead of the other independently and hold 30 seconds   Standing on One Leg Tries to lift leg/unable to hold 3 seconds but remains standing independently  2 sec. on LLE   Total Score 43             Balance Exercises - 02/04/16 0929      Balance Exercises: Standing   Standing Eyes Opened Narrow base of support (BOS);Wide (BOA);Solid surface;3 reps;10 secs;20 secs;30 secs   Standing Eyes Closed Narrow base of support (BOS);Wide (BOA);3 reps;20 secs;30 secs;10 secs;Solid surface   Tandem Stance Eyes open;Intermittent upper extremity support;3 reps;30 secs  modified heel-toe   SLS Eyes open;Solid surface;Upper extremity support 1;3 reps;10 secs    See HEP pt instructions for details.        PT Education - 02/04/16 1023    Education provided Yes   Education Details PT provided pt with balance HEP and educated pt on falls risk based on BERG score. PT educated pt on using rollator at all times to reduce falls risk and to bring rollator next session.    Person(s) Educated Patient   Methods Explanation;Demonstration;Verbal cues;Handout   Comprehension Verbalized understanding;Returned demonstration          PT Short Term Goals - 02/04/16 1032      PT SHORT TERM GOAL #1   Title Pt will be IND in HEP to improve  strength and balance. TARGET DATE FOR ALL STGS: 02/20/16   Status On-going     PT SHORT TERM GOAL #2   Title Perform BERG and write goals prn.   Status Achieved     PT SHORT TERM GOAL #3   Title Pt will perform TUG with no AD in</=13.5sec. to decr. falls risk.   Status On-going     PT SHORT TERM GOAL #4   Title Pt will amb. 500' with LRAD at MOD I level, over even terrain, to improve functional mobility.    Status On-going     PT SHORT TERM GOAL #5   Title Pt will improve BERG score to >/=47/56 to decr. falls risk   Status New  PT Long Term Goals - 02/04/16 1033      PT LONG TERM GOAL #1   Title Pt will amb. 1000' over even/uneven terrain with LRAD in order to improve functional mobility and go to the store. TARGET DATE FOR ALL LTGS: 03/19/16   Status On-going     PT LONG TERM GOAL #2   Title Pt will be able to perform dynamic standing activities (weight shifting lat/post/ant and reaching forward 5") for 10 minutes in order to perform in band.    Status On-going     PT LONG TERM GOAL #3   Title Pt will verbalize understanding of fall prevention strategies to reduce falls risk.    Status On-going     PT LONG TERM GOAL #4   Title Pt will improve BERG score to >/=51/56 to decr. falls risk.    Status New               Plan - 02/04/16 1029    Clinical Impression Statement Pt's BERG score indicates pt is at significant risk for falls, therefore, PT educated pt on the importance of using rollator at all times. Pt experienced incr. postural sway during activities which required incr. vestibular input, such as eyes closed. Pt would continue to benefit from skilled PT to improve safety during functional mobility.    Rehab Potential Good   Clinical Impairments Affecting Rehab Potential HOH, impaired vision, CABG in 2000, MI in 1991, CAD, a-fib, DM, CVA 05/2015 per pt but chart states 05/2014, Hx of R fibula fracture, arthritis, HTN, hyperlipidemia, angina   PT  Frequency 2x / week   PT Duration 8 weeks   PT Treatment/Interventions ADLs/Self Care Home Management;Biofeedback;Canalith Repostioning;Neuromuscular re-education;Balance training;Therapeutic exercise;Therapeutic activities;Manual techniques;Functional mobility training;Stair training;Gait training;DME Instruction;Orthotic Fit/Training;Patient/family education;Vestibular   PT Next Visit Plan Add strengthening/stretching to HEP   Consulted and Agree with Plan of Care Patient      Patient will benefit from skilled therapeutic intervention in order to improve the following deficits and impairments:  Abnormal gait, Decreased endurance, Decreased knowledge of use of DME, Decreased strength, Pain, Impaired sensation, Decreased balance, Decreased mobility, Impaired vision/preception, Dizziness, Decreased cognition, Decreased coordination, Postural dysfunction  Visit Diagnosis: Other abnormalities of gait and mobility  Muscle weakness (generalized)     Problem List Patient Active Problem List   Diagnosis Date Noted  . CVA (cerebral infarction) 02/14/2015  . Cerebral embolism with cerebral infarction ([place]) 02/14/2015  . Fall   . Stroke ([place])   . UTI (lower urinary tract infection) 05/29/2014  . Fracture of distal end of right fibula 05/29/2014  . Rhabdomyolysis 05/29/2014  . Transaminitis 05/29/2014  . Weakness of both lower extremities 01/11/2014  . Pain of left lower extremity- and Right 01/11/2014  . Aftercare following surgery of the circulatory system, [place] 01/11/2014  . Peripheral vascular disease, unspecified 01/11/2014  . Cellulitis of great toe, right 08/23/2013  . CAD (coronary artery disease)   . Hypertensive heart disease without CHF   . Paroxysmal atrial fibrillation   . Diabetes mellitus type 2 with peripheral artery disease ([place])   . Obesity (BMI 30-39.9)   . Hyperlipidemia   . Atherosclerosis of native arteries of extremity with intermittent claudication ([place])   .  History of GI bleed 04/21/2009  . S/P CABG (coronary artery bypass graft) 11/20/1998    Xavious Sharrar L 02/04/2016, 10:33 AM  [facility] [address] [place], [place], [address] Phone: [phone]   Fax:  [phone]  Name: Kristina Summers MRN: [id number] Date of Birth: 1935/10/22  Geoffry Paradise, PT,DPT 02/04/16 10:34 AM Phone: [phone] Fax: [phone]

## 2016-02-05 ENCOUNTER — Ambulatory Visit: Payer: Medicare Other

## 2016-02-05 DIAGNOSIS — R2689 Other abnormalities of gait and mobility: Secondary | ICD-10-CM

## 2016-02-05 DIAGNOSIS — M6281 Muscle weakness (generalized): Secondary | ICD-10-CM

## 2016-02-05 NOTE — Therapy (Signed)
[facility] [address] [place], [place], [address] Phone: [phone]   Fax:  [phone]  Physical Therapy Treatment  Patient Details  Name: Kristina Summers MRN: [id number] Date of Birth: Apr 26, 1935 Referring Provider: [name]  Encounter Date: 02/05/2016      PT End of Session - 02/05/16 1207    Visit Number 4   Number of Visits 17   Date for PT Re-Evaluation 03/23/16   Authorization Type G-CODE EVERY 10TH VISIT.   PT Start Time 1016   PT Stop Time 1057   PT Time Calculation (min) 41 min   Equipment Utilized During Treatment --  S prn   Activity Tolerance Patient tolerated treatment well   Behavior During Therapy WFL for tasks assessed/performed      Past Medical History:  Diagnosis Date  . Angina   . Arthritis   . Atrial fibrillation ([place])   . CAD (coronary artery disease)   . Carotid artery occlusion   . Diabetes mellitus   . Fibromyalgia   . GERD (gastroesophageal reflux disease)   . GI bleed 1/11  . Hyperlipidemia   . Hypertension   . Myocardial infarction ~ 1991  . Normal nuclear stress test 03/18/2011  . Obesity   . Peripheral vascular disease ([place])   . Stroke [facility])    May 24, 2014  . UTI (lower urinary tract infection) Sept. 2015    Past Surgical History:  Procedure Laterality Date  . ABDOMINAL AORTAGRAM N/A 04/01/2011   Procedure: ABDOMINAL AORTAGRAM;  Surgeon: Conrad McKinney, MD;  Location: [facility] CATH LAB;  Service: Cardiovascular;  Laterality: N/A;  . ANGIOPLASTY  06/17/11   Left leg common femoral artery cannulation under u/s Left leg runoff  . CATARACT EXTRACTION W/ INTRAOCULAR LENS  IMPLANT, BILATERAL  2004-2005  . CORONARY ARTERY BYPASS GRAFT  2000   CABG X5  . EYE SURGERY    . LOWER EXTREMITY ANGIOGRAM Bilateral 04/01/2011   Procedure: LOWER EXTREMITY ANGIOGRAM;  Surgeon: Conrad Tulare, MD;  Location: [facility] CATH LAB;  Service: Cardiovascular;  Laterality: Bilateral;  . LOWER  EXTREMITY ANGIOGRAM Left 06/17/2011   Procedure: LOWER EXTREMITY ANGIOGRAM;  Surgeon: Conrad Falconer, MD;  Location: [facility] CATH LAB;  Service: Cardiovascular;  Laterality: Left;  . LOWER EXTREMITY ANGIOGRAM N/A 11/18/2011   Procedure: LOWER EXTREMITY ANGIOGRAM;  Surgeon: Conrad Royalton, MD;  Location: [facility] CATH LAB;  Service: Cardiovascular;  Laterality: N/A;  . PERCUTANEOUS STENT INTERVENTION Right 04/01/2011   Procedure: PERCUTANEOUS STENT INTERVENTION;  Surgeon: Conrad Bowman, MD;  Location: [facility] CATH LAB;  Service: Cardiovascular;  Laterality: Right;  rt iliac stent  . PERIPHERAL ARTERIAL STENT GRAFT  04/01/11   right common iliac  . TRIGGER FINGER RELEASE  1996   right thumb    There were no vitals filed for this visit.      Subjective Assessment - 02/05/16 1018    Subjective Pt denied falls or changes since last visit. Pt brought in rollator today. Pt had an appt. with PCP yesterday and he told pt that he would like to order an x-ray to assess cause of pt's intermittent back pain during amb.   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 No/denies          Therex: Pt performed strengthening HEP with cues for technique and S for safety. Please see pt instructions for details.               [facility] Adult PT Treatment/Exercise - 02/05/16 1019      Ambulation/Gait   Ambulation/Gait Yes   Ambulation/Gait Assistance 5: Supervision   Ambulation/Gait Assistance Details Pt noted to amb. with improve stride length and heel strike with rollator vs. no AD. Cues for wt. shifting while traversing inclines/declines outdoors.   Ambulation Distance (Feet) 200 Feet  230 inside and 500' outside    Assistive device Rollator   Gait Pattern Step-through pattern;Decreased stride length;Decreased weight shift to right;Decreased dorsiflexion - right;Decreased trunk rotation   Ambulation Surface Level;Indoor;Unlevel;Outdoor   Gait velocity 2.32ft/sec.             Balance Exercises - 02/04/16 0929      Balance Exercises: Standing   Standing Eyes Opened Narrow base of support (BOS);Wide (BOA);Solid surface;3 reps;10 secs;20 secs;30 secs   Standing Eyes Closed Narrow base of support (BOS);Wide (BOA);3 reps;20 secs;30 secs;10 secs;Solid surface   Tandem Stance Eyes open;Intermittent upper extremity support;3 reps;30 secs  modified heel-toe   SLS Eyes open;Solid surface;Upper extremity support 1;3 reps;10 secs           PT Education - 02/05/16 1206    Education provided Yes   Education Details PT provided pt with strengthening HEP and educated pt on the importance of amb. with rollator vs. no AD for safety. PT educated pt on improved gait speed and balance with rollator vs. no AD.   Person(s) Educated Patient   Methods Explanation;Demonstration;Tactile cues;Verbal cues;Handout   Comprehension Returned demonstration;Verbalized understanding          PT Short Term Goals - 02/04/16 1032      PT SHORT TERM GOAL #1   Title Pt will be IND in HEP to improve strength and balance. TARGET DATE FOR ALL STGS: 02/20/16   Status On-going     PT SHORT TERM GOAL #2   Title Perform BERG and write goals prn.   Status Achieved     PT SHORT TERM GOAL #3   Title Pt will perform TUG with no AD in</=13.5sec. to decr. falls risk.   Status On-going     PT SHORT TERM GOAL #4   Title Pt will amb. 500' with LRAD at MOD I level, over even terrain, to improve functional mobility.    Status On-going     PT SHORT TERM GOAL #5   Title Pt will improve BERG score to >/=47/56 to decr. falls risk   Status New           PT Long Term Goals - 02/04/16 1033      PT LONG TERM GOAL #1   Title Pt  will amb. 1000' over even/uneven terrain with LRAD in order to improve functional mobility and go to the store. TARGET DATE FOR ALL LTGS: 03/19/16   Status On-going     PT LONG TERM GOAL #2   Title Pt will be able to perform dynamic standing activities (weight shifting lat/post/ant and reaching forward 5") for 10 minutes in order to perform in band.    Status On-going     PT LONG TERM GOAL #3   Title Pt will verbalize understanding of fall prevention strategies to reduce falls risk.    Status  On-going     PT LONG TERM GOAL #4   Title Pt will improve BERG score to >/=51/56 to decr. falls risk.    Status New               Plan - 02/05/16 1207    Clinical Impression Statement Pt demonstrated improved balance, less gait deviations, and improved gait speed during amb. with rollator vs. no AD. Pt tolerated strengthening HEP well, but did require 2 seated rest breaks 2/2 fatigue. Continue with POC.   Rehab Potential Good   Clinical Impairments Affecting Rehab Potential HOH, impaired vision, CABG in 2000, MI in 1991, CAD, a-fib, DM, CVA 05/2015 per pt but chart states 05/2014, Hx of R fibula fracture, arthritis, HTN, hyperlipidemia, angina   PT Frequency 2x / week   PT Duration 8 weeks   PT Treatment/Interventions ADLs/Self Care Home Management;Biofeedback;Canalith Repostioning;Neuromuscular re-education;Balance training;Therapeutic exercise;Therapeutic activities;Manual techniques;Functional mobility training;Stair training;Gait training;DME Instruction;Orthotic Fit/Training;Patient/family education;Vestibular   PT Next Visit Plan Stretching HEP for back/posture prn. High level balance activities.   Consulted and Agree with Plan of Care Patient      Patient will benefit from skilled therapeutic intervention in order to improve the following deficits and impairments:  Abnormal gait, Decreased endurance, Decreased knowledge of use of DME, Decreased strength, Pain, Impaired sensation,  Decreased balance, Decreased mobility, Impaired vision/preception, Dizziness, Decreased cognition, Decreased coordination, Postural dysfunction  Visit Diagnosis: Muscle weakness (generalized)  Other abnormalities of gait and mobility     Problem List Patient Active Problem List   Diagnosis Date Noted  . CVA (cerebral infarction) 09/25/2014  . Cerebral embolism with cerebral infarction ([place]) 09/25/2014  . Fall   . Stroke ([place])   . UTI (lower urinary tract infection) 05/29/2014  . Fracture of distal end of right fibula 05/29/2014  . Rhabdomyolysis 05/29/2014  . Transaminitis 05/29/2014  . Weakness of both lower extremities 01/11/2014  . Pain of left lower extremity- and Right 01/11/2014  . Aftercare following surgery of the circulatory system, [place] 01/11/2014  . Peripheral vascular disease, unspecified 01/11/2014  . Cellulitis of great toe, right 08/23/2013  . CAD (coronary artery disease)   . Hypertensive heart disease without CHF   . Paroxysmal atrial fibrillation   . Diabetes mellitus type 2 with peripheral artery disease ([place])   . Obesity (BMI 30-39.9)   . Hyperlipidemia   . Atherosclerosis of native arteries of extremity with intermittent claudication ([place])   . History of GI bleed 04/21/2009  . S/P CABG (coronary artery bypass graft) 11/20/1998    Kehinde Totzke L 02/05/2016, 12:09 PM  [facility] [address] [place], [place], [address] Phone: [phone]   Fax:  [phone]  Name: Kristina Summers MRN: [id number] Date of Birth: 30-Jan-1936  Geoffry Paradise, PT,DPT 02/05/16 12:10 PM Phone: [phone] Fax: [phone]

## 2016-02-05 NOTE — Patient Instructions (Signed)
Functional Quadriceps: Chair Squat    Keeping feet flat on floor, shoulder width apart, squat as low as is comfortable. Use support as necessary. Repeat __10__ times per set. Do _2___ sets per session. Do __3-4__ sessions per week.  http://orth.exer.us/737   Copyright  VHI. All rights reserved.   AMBULATION: Side Step    Hold counter as needed. Perform a mini squat and then Step sideways for 10 steps (or length of counter). Repeat in opposite direction. __4_ times, _1__ sets per day, _3-4__ days per week Use assistive device.  Copyright  VHI. All rights reserved.   Toe / Heel Raise    Stand at counter, with hands on counter. Gently rock back on heels and raise toes 10 times. Then rock forward on toes and raise heels 10 times. Repeat sequence __1__ times per session. Do __3-4__ sessions per week.  Copyright  VHI. All rights reserved.   Hip Extension (Standing)    Stand with support.  Move right leg backward with straight knee. Hold for _1-2__ seconds.Then bring foot back to starting position. Repeat with left leg. Repeat _10__ times. Do _3-4__ times a week.   Copyright  VHI. All rights reserved.

## 2016-02-10 ENCOUNTER — Encounter: Payer: Self-pay | Admitting: Sports Medicine

## 2016-02-10 ENCOUNTER — Ambulatory Visit (INDEPENDENT_AMBULATORY_CARE_PROVIDER_SITE_OTHER): Payer: Medicare Other | Admitting: Sports Medicine

## 2016-02-10 DIAGNOSIS — M79676 Pain in unspecified toe(s): Secondary | ICD-10-CM | POA: Diagnosis not present

## 2016-02-10 DIAGNOSIS — E1151 Type 2 diabetes mellitus with diabetic peripheral angiopathy without gangrene: Secondary | ICD-10-CM

## 2016-02-10 DIAGNOSIS — E0849 Diabetes mellitus due to underlying condition with other diabetic neurological complication: Secondary | ICD-10-CM

## 2016-02-10 DIAGNOSIS — B351 Tinea unguium: Secondary | ICD-10-CM

## 2016-02-10 NOTE — Progress Notes (Signed)
Patient ID: Kristina Summers, female   DOB: [birth year], 80 y.o.   MRN: [id number] Subjective: Kristina Summers is a 80 y.o. female patient with history of diabetes who presents to office today complaining of long, painful nails and callus  while ambulating in shoes; unable to trim. Patient states that the glucose reading this morning was "good". Patient denies any new changes in medication or new problems. Patient denies any new cramping, numbness, burning or tingling in the legs.  + history of stroke, 1 year ago and has vascular follow up with Dr. Bridgett Larsson.  Patient Active Problem List   Diagnosis Date Noted  . CVA (cerebral infarction) Feb 13, 202016  . Cerebral embolism with cerebral infarction ([place]) Feb 13, 202016  . Fall   . Stroke ([place])   . UTI (lower urinary tract infection) 05/29/2014  . Fracture of distal end of right fibula 05/29/2014  . Rhabdomyolysis 05/29/2014  . Transaminitis 05/29/2014  . Weakness of both lower extremities 01/11/2014  . Pain of left lower extremity- and Right 01/11/2014  . Aftercare following surgery of the circulatory system, [place] 01/11/2014  . Peripheral vascular disease, unspecified 01/11/2014  . Cellulitis of great toe, right 08/23/2013  . CAD (coronary artery disease)   . Hypertensive heart disease without CHF   . Paroxysmal atrial fibrillation   . Diabetes mellitus type 2 with peripheral artery disease ([place])   . Obesity (BMI 30-39.9)   . Hyperlipidemia   . Atherosclerosis of native arteries of extremity with intermittent claudication ([place])   . History of GI bleed 04/21/2009  . S/P CABG (coronary artery bypass graft) 11/20/1998   Current Outpatient Prescriptions on File Prior to Visit  Medication Sig Dispense Refill  . acetaminophen (TYLENOL) 325 MG tablet Take 2 tablets (650 mg total) by mouth every 6 (six) hours as needed for mild pain (or Fever >/= 101). 60 tablet 1  . amLODipine (NORVASC) 5 MG tablet Take 1 tablet by mouth daily.    Marland Kitchen apixaban (ELIQUIS) 5 MG TABS  tablet Take 1 tablet (5 mg total) by mouth 2 (two) times daily. 60 tablet 0  . atorvastatin (LIPITOR) 80 MG tablet Take 1 tablet (80 mg total) by mouth daily. 30 tablet 0  . furosemide (LASIX) 40 MG tablet Take 1 tablet (40 mg total) by mouth daily. (Patient taking differently: Take 40 mg by mouth 2 (two) times daily. ) 30 tablet   . insulin NPH Human (HUMULIN N,NOVOLIN N) 100 UNIT/ML injection Inject 0.38 mLs (38 Units total) into the skin 2 (two) times daily. 38 in am and 34 at night (Patient taking differently: Inject 37 Units into the skin daily before breakfast. 37 in am and 32 at night) 10 mL 11  . losartan (COZAAR) 50 MG tablet Take 1 tablet (50 mg total) by mouth daily. (Patient taking differently: Take 50 mg by mouth 2 (two) times daily. )    . metoprolol tartrate (LOPRESSOR) 25 MG tablet Take 1 tablet by mouth 2 (two) times daily.    . Multiple Vitamins-Minerals (ONE-A-DAY 50 PLUS PO) Take 1 tablet by mouth daily.    Marland Kitchen NITROSTAT 0.4 MG SL tablet Place 1 tablet under the tongue every 5 (five) minutes x 3 doses as needed. Chest pain  1   No current facility-administered medications on file prior to visit.    Allergies  Allergen Reactions  . Morphine And Related Nausea And Vomiting    Chest pain    . Lisinopril Cough  . Sertraline Hcl     REACTION: nausea  .  Zoloft [Sertraline Hcl] Other (See Comments)    Nausea     Recent Results (from the past 2160 hour(s))  Sedimentation rate     Status: None   Collection Time: 01/12/16  9:22 AM  Result Value Ref Range   Sed Rate 24 0 - 40 mm/hr  C-reactive protein     Status: None   Collection Time: 01/12/16  9:22 AM  Result Value Ref Range   CRP 4.6 0.0 - 4.9 mg/L    Objective: General: Patient is awake, alert, and oriented x 3 and in no acute distress.  Integument: Skin is warm, dry and supple bilateral. Nails are tender, long, thickened and  dystrophic with subungual debris, consistent with onychomycosis, 1-5 bilateral. No signs  of infection. Minimal callus sub met 5 bilateral without infection. Remaining integument unremarkable.  Vasculature:  Dorsalis Pedis pulse 0/4 bilateral. Posterior Tibial pulse  0/4 bilateral. No ischemia or gangrene. Capillary fill time <5 sec 1-5 bilateral. No hair growth to the level of the digits. Temperature gradient within normal limits. No varicosities present bilateral. No edema present bilateral.   Neurology: The patient has diminished sensation measured with a 5.07/10g Semmes Weinstein Monofilament at all pedal sites bilateral. Vibratory sensation absent bilateral with tuning fork. No Babinski sign present bilateral.   Musculoskeletal: No symptomatic pedal deformities noted bilateral. Muscular strength 4/5 in all lower extremity muscular groups bilateral without pain on range of motion . No tenderness with calf compression bilateral.  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 -Discussed and educated patient on diabetic foot care, especially with regards to the vascular, neurological and musculoskeletal systems.  -Stressed the importance of good glycemic control and the detriment of not controlling glucose levels in relation to the foot. -Mechanically debrided all nails 1-5 bilateral using sterile nail nipper and filed with dremel without incident  -Callus minimal at this visit -Continue with vascular follow up as scheduled -Awaiting Diabetic shoes -Answered all patient questions -Patient to return  in 3 months for at risk foot care -Patient advised to call the office if any problems or questions arise in the meantime.  Landis Martins, DPM

## 2016-02-11 ENCOUNTER — Ambulatory Visit: Payer: Medicare Other | Admitting: Physical Therapy

## 2016-02-13 ENCOUNTER — Ambulatory Visit: Payer: Medicare Other

## 2016-02-13 DIAGNOSIS — R2689 Other abnormalities of gait and mobility: Secondary | ICD-10-CM

## 2016-02-13 DIAGNOSIS — M6281 Muscle weakness (generalized): Secondary | ICD-10-CM

## 2016-02-13 NOTE — Therapy (Signed)
[facility] [address] [place], [place], [address] Phone: [phone]   Fax:  [phone]  Physical Therapy Treatment  Patient Details  Name: Kristina Summers MRN: [id number] Date of Birth: [birth year] Referring Provider: [name]  Encounter Date: 02/13/2016      PT End of Session - 02/13/16 1249    Visit Number 5   Number of Visits 17   Date for PT Re-Evaluation 03/23/16   Authorization Type G-CODE EVERY 10TH VISIT.   PT Start Time 0804   PT Stop Time 0844   PT Time Calculation (min) 40 min   Equipment Utilized During Treatment Gait belt   Activity Tolerance Patient tolerated treatment well   Behavior During Therapy WFL for tasks assessed/performed      Past Medical History:  Diagnosis Date  . Angina   . Arthritis   . Atrial fibrillation ([place])   . CAD (coronary artery disease)   . Carotid artery occlusion   . Diabetes mellitus   . Fibromyalgia   . GERD (gastroesophageal reflux disease)   . GI bleed 1/11  . Hyperlipidemia   . Hypertension   . Myocardial infarction ~ 1991  . Normal nuclear stress test 03/18/2011  . Obesity   . Peripheral vascular disease ([place])   . Stroke [facility])    May 24, 2014  . UTI (lower urinary tract infection) Sept. 2015    Past Surgical History:  Procedure Laterality Date  . ABDOMINAL AORTAGRAM N/A 04/01/2011   Procedure: ABDOMINAL AORTAGRAM;  Surgeon: Conrad [place], MD;  Location: [facility] CATH LAB;  Service: Cardiovascular;  Laterality: N/A;  . ANGIOPLASTY  06/17/11   Left leg common femoral artery cannulation under u/s Left leg runoff  . CATARACT EXTRACTION W/ INTRAOCULAR LENS  IMPLANT, BILATERAL  2004-2005  . CORONARY ARTERY BYPASS GRAFT  2000   CABG X5  . EYE SURGERY    . LOWER EXTREMITY ANGIOGRAM Bilateral 04/01/2011   Procedure: LOWER EXTREMITY ANGIOGRAM;  Surgeon: Conrad [place], MD;  Location: [facility] CATH LAB;  Service: Cardiovascular;  Laterality: Bilateral;  . LOWER EXTREMITY  ANGIOGRAM Left 06/17/2011   Procedure: LOWER EXTREMITY ANGIOGRAM;  Surgeon: Conrad [place], MD;  Location: [facility] CATH LAB;  Service: Cardiovascular;  Laterality: Left;  . LOWER EXTREMITY ANGIOGRAM N/A 11/18/2011   Procedure: LOWER EXTREMITY ANGIOGRAM;  Surgeon: Conrad [place], MD;  Location: [facility] CATH LAB;  Service: Cardiovascular;  Laterality: N/A;  . PERCUTANEOUS STENT INTERVENTION Right 04/01/2011   Procedure: PERCUTANEOUS STENT INTERVENTION;  Surgeon: Conrad Odell, MD;  Location: [facility] CATH LAB;  Service: Cardiovascular;  Laterality: Right;  rt iliac stent  . PERIPHERAL ARTERIAL STENT GRAFT  04/01/11   right common iliac  . TRIGGER FINGER RELEASE  1996   right thumb    There were no vitals filed for this visit.      Subjective Assessment - 02/13/16 0806    Subjective Pt denied falls or changes since last visit. Pt reported she is not sure where to go for the back x-ray. Pt went to the foot doctor and reports her toenails cut into one of her toes (on each foot) and told pt to tape toes and MD trimmed pt's toenails to ensure skin integrity.    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 No/denies                         [facility] Adult PT Treatment/Exercise - 02/13/16 0818      High Level Balance   High Level Balance Activities Side stepping;Braiding;Backward walking;Head turns;Marching forwards   High Level Balance Comments Performed in // bars with min guard to S, 6x7' with pt progressing from 2 hand support to 0-1 hand support. Cues and demo for technique. Pt required standing rest breaks 2/2 fatigue and slight incr. in LBP during last activity.            Self Care:      PT Education - 02/13/16 1247    Education provided Yes   Education Details PT reviewed HEP with pt, and re-educated pt to perform strengthening HEP 3x/week vs. every day (as pt reported she was performing daily). PT encouraged pt to call MD back regarding where he would like pt to go for back x-ray. Pt also stated he wished for pt to have a pap smear and mammogram but she doesn't have a GYN MD, so PT encouraged pt to notify PCP.    Person(s) Educated Patient   Methods Explanation   Comprehension Verbalized understanding          PT Short Term Goals - 02/04/16 1032      PT SHORT TERM GOAL #1   Title Pt will be IND in HEP to improve strength and balance. TARGET DATE FOR ALL STGS: 02/20/16   Status On-going     PT SHORT TERM GOAL #2   Title Perform BERG and write goals prn.   Status Achieved     PT SHORT TERM GOAL #3   Title Pt will perform TUG with no AD in</=13.5sec. to decr. falls risk.   Status On-going     PT SHORT TERM GOAL #4   Title Pt will amb. 500' with LRAD at MOD I level, over even terrain, to improve functional mobility.    Status On-going     PT SHORT TERM GOAL #5   Title Pt will improve BERG score to >/=47/56 to decr. falls risk   Status New           PT Long Term Goals - 02/04/16 1033      PT LONG TERM GOAL #1   Title Pt will amb. 1000' over even/uneven terrain with LRAD in order to improve functional mobility and go to the store. TARGET DATE FOR ALL LTGS: 03/19/16   Status On-going     PT LONG TERM GOAL #2   Title Pt will be able to perform dynamic standing activities (weight shifting lat/post/ant and reaching forward 5") for 10 minutes in order to perform in band.    Status On-going     PT LONG TERM GOAL #3   Title Pt will verbalize understanding of fall prevention strategies to reduce falls risk.    Status On-going     PT LONG TERM GOAL #4   Title Pt will improve BERG score to >/=51/56 to decr. falls risk.    Status New                Plan - 02/13/16 1249    Clinical Impression Statement Pt continues to demonstrate incr. postural sway and LOB during high level balance activities, and would continue to benefit from  skilled PT to improve safety during functional mobility. Pt continues to demonstrate improved balance with rollator vs. no AD and PT educated pt on trialing SPC at home as pt reports rollator is too large for her house but can use it outside of home.    Rehab Potential Good   Clinical Impairments Affecting Rehab Potential HOH, impaired vision, CABG in 2000, MI in 1991, CAD, a-fib, DM, CVA 05/2015 per pt but chart states 05/2014, Hx of R fibula fracture, arthritis, HTN, hyperlipidemia, angina   PT Frequency 2x / week   PT Duration 8 weeks   PT Treatment/Interventions ADLs/Self Care Home Management;Biofeedback;Canalith Repostioning;Neuromuscular re-education;Balance training;Therapeutic exercise;Therapeutic activities;Manual techniques;Functional mobility training;Stair training;Gait training;DME Instruction;Orthotic Fit/Training;Patient/family education;Vestibular   PT Next Visit Plan Check STGs. High level balance and gait training over uneven surfaces, trial SPC.    Consulted and Agree with Plan of Care Patient      Patient will benefit from skilled therapeutic intervention in order to improve the following deficits and impairments:  Abnormal gait, Decreased endurance, Decreased knowledge of use of DME, Decreased strength, Pain, Impaired sensation, Decreased balance, Decreased mobility, Impaired vision/preception, Dizziness, Decreased cognition, Decreased coordination, Postural dysfunction  Visit Diagnosis: Other abnormalities of gait and mobility  Muscle weakness (generalized)     Problem List Patient Active Problem List   Diagnosis Date Noted  . CVA (cerebral infarction) 05-30-2014  . Cerebral embolism with cerebral infarction ([place]) 05-30-2014  . Fall   . Stroke ([place])   . UTI (lower urinary tract  infection) 05/29/2014  . Fracture of distal end of right fibula 05/29/2014  . Rhabdomyolysis 05/29/2014  . Transaminitis 05/29/2014  . Weakness of both lower extremities 01/11/2014  . Pain of left lower extremity- and Right 01/11/2014  . Aftercare following surgery of the circulatory system, [place] 01/11/2014  . Peripheral vascular disease, unspecified 01/11/2014  . Cellulitis of great toe, right 08/23/2013  . CAD (coronary artery disease)   . Hypertensive heart disease without CHF   . Paroxysmal atrial fibrillation   . Diabetes mellitus type 2 with peripheral artery disease ([place])   . Obesity (BMI 30-39.9)   . Hyperlipidemia   . Atherosclerosis of native arteries of extremity with intermittent claudication ([place])   . History of GI bleed 04/21/2009  . S/P CABG (coronary artery bypass graft) 11/20/1998    Terika Pillard L 02/13/2016, 12:52 PM  [facility] [address] [place], [place], [address] Phone: [phone]   Fax:  [phone]  Name: Kristina Summers MRN: [id number] Date of Birth: 09-22-1935   Geoffry Paradise, PT,DPT 02/13/16 12:52 PM Phone: [phone] Fax: [phone]

## 2016-02-18 ENCOUNTER — Ambulatory Visit: Payer: Medicare Other | Attending: Neurology | Admitting: Physical Therapy

## 2016-02-18 DIAGNOSIS — R2689 Other abnormalities of gait and mobility: Secondary | ICD-10-CM | POA: Diagnosis present

## 2016-02-18 DIAGNOSIS — M6281 Muscle weakness (generalized): Secondary | ICD-10-CM | POA: Diagnosis present

## 2016-02-18 NOTE — Therapy (Signed)
[facility] [address] [place], [place], [address] Phone: [phone]   Fax:  [phone]  Physical Therapy Treatment  Patient Details  Name: Kristina Summers MRN: [id number] Date of Birth: 1935/06/24 Referring Provider: [name]  Encounter Date: 02/18/2016      PT End of Session - 02/18/16 0934    Visit Number 6   Number of Visits 17   Date for PT Re-Evaluation 03/23/16   Authorization Type G-CODE EVERY 10TH VISIT.   PT Start Time 0848   PT Stop Time 0930   PT Time Calculation (min) 42 min   Equipment Utilized During Treatment Gait belt   Activity Tolerance Patient tolerated treatment well   Behavior During Therapy WFL for tasks assessed/performed      Past Medical History:  Diagnosis Date  . Angina   . Arthritis   . Atrial fibrillation ([place])   . CAD (coronary artery disease)   . Carotid artery occlusion   . Diabetes mellitus   . Fibromyalgia   . GERD (gastroesophageal reflux disease)   . GI bleed 1/11  . Hyperlipidemia   . Hypertension   . Myocardial infarction ~ 1991  . Normal nuclear stress test 03/18/2011  . Obesity   . Peripheral vascular disease ([place])   . Stroke [facility])    May 24, 2014  . UTI (lower urinary tract infection) Sept. 2015    Past Surgical History:  Procedure Laterality Date  . ABDOMINAL AORTAGRAM N/A 04/01/2011   Procedure: ABDOMINAL AORTAGRAM;  Surgeon: Conrad Lyles, MD;  Location: [facility] CATH LAB;  Service: Cardiovascular;  Laterality: N/A;  . ANGIOPLASTY  06/17/11   Left leg common femoral artery cannulation under u/s Left leg runoff  . CATARACT EXTRACTION W/ INTRAOCULAR LENS  IMPLANT, BILATERAL  2004-2005  . CORONARY ARTERY BYPASS GRAFT  2000   CABG X5  . EYE SURGERY    . LOWER EXTREMITY ANGIOGRAM Bilateral 04/01/2011   Procedure: LOWER EXTREMITY ANGIOGRAM;  Surgeon: Conrad Greensburg, MD;  Location: [facility] CATH LAB;  Service: Cardiovascular;  Laterality: Bilateral;  . LOWER EXTREMITY  ANGIOGRAM Left 06/17/2011   Procedure: LOWER EXTREMITY ANGIOGRAM;  Surgeon: Conrad Kwigillingok, MD;  Location: [facility] CATH LAB;  Service: Cardiovascular;  Laterality: Left;  . LOWER EXTREMITY ANGIOGRAM N/A 11/18/2011   Procedure: LOWER EXTREMITY ANGIOGRAM;  Surgeon: Conrad Longtown, MD;  Location: [facility] CATH LAB;  Service: Cardiovascular;  Laterality: N/A;  . PERCUTANEOUS STENT INTERVENTION Right 04/01/2011   Procedure: PERCUTANEOUS STENT INTERVENTION;  Surgeon: Conrad Thoreau, MD;  Location: [facility] CATH LAB;  Service: Cardiovascular;  Laterality: Right;  rt iliac stent  . PERIPHERAL ARTERIAL STENT GRAFT  04/01/11   right common iliac  . TRIGGER FINGER RELEASE  1996   right thumb    There were no vitals filed for this visit.      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 No/denies            [facility] PT Assessment - 02/18/16 0001  Standardized Balance Assessment   Standardized Balance Assessment Berg Balance Test     Timed Up and Go Test   TUG Normal TUG   Normal TUG (seconds) 12.94  No AD                     [facility] Adult PT Treatment/Exercise - 02/18/16 0001      Ambulation/Gait   Ambulation/Gait Yes   Ambulation/Gait Assistance 5: Supervision   Ambulation/Gait Assistance Details For activity tolerance   Ambulation Distance (Feet) 500 Feet   Assistive device Rollator   Gait Pattern Step-through pattern;Decreased stride length;Decreased weight shift to right;Decreased dorsiflexion - right;Decreased trunk rotation   Ambulation Surface Level;Indoor     Berg  Balance Test   Sit to Stand Able to stand without using hands and stabilize independently   Standing Unsupported Able to stand safely 2 minutes   Sitting with Back Unsupported but Feet Supported on Floor or Stool Able to sit safely and securely 2 minutes   Stand to Sit Sits safely with minimal use of hands   Transfers Able to transfer safely, minor use of hands   Standing Unsupported with Eyes Closed Able to stand 10 seconds with supervision   Standing Ubsupported with Feet Together Able to place feet together independently and stand for 1 minute with supervision   From Standing, Reach Forward with Outstretched Arm Can reach forward >12 cm safely (5")   From Standing Position, Pick up Object from [place] to pick up shoe safely and easily   From Standing Position, Turn to Look Behind Over each Shoulder Looks behind from both sides and weight shifts well   Turn 360 Degrees Able to turn 360 degrees safely but slowly   Standing Unsupported, Alternately Place Feet on Step/Stool Able to complete 4 steps without aid or supervision   Standing Unsupported, One Foot in Front Able to plae foot ahead of the other independently and hold 30 seconds   Standing on One Leg Tries to lift leg/unable to hold 3 seconds but remains standing independently   Total Score 45                PT Education - 02/18/16 1304    Education provided Yes   Education Details Discussed goals tested, POC, scheduling, and HEP.   Person(s) Educated Patient   Methods Explanation   Comprehension Verbalized understanding          PT Short Term Goals - 02/18/16 1256      PT SHORT TERM GOAL #1   Title Pt will be IND in HEP to improve strength and balance. TARGET DATE FOR ALL STGS: 02/20/16   Baseline Met; 02/18/16   Status Achieved     PT SHORT TERM GOAL #2   Title Perform BERG and write goals prn.   Status Achieved     PT SHORT TERM GOAL #3   Title Pt will perform TUG with no AD in</=13.5sec. to decr. falls  risk.   Baseline 12.94 without AD; 02/18/16.   Status Achieved     PT SHORT TERM GOAL #4   Title Pt will amb. 500' with LRAD at MOD I level, over even terrain, to improve functional mobility.    Baseline Met with rollator; 02/18/16   Status Achieved     PT SHORT TERM GOAL #5   Title Pt will improve BERG score to >/=47/56 to decr. falls risk   Baseline 45/56; 02/18/16.   Status Not Met  PT Long Term Goals - 02/04/16 1033      PT LONG TERM GOAL #1   Title Pt will amb. 1000' over even/uneven terrain with LRAD in order to improve functional mobility and go to the store. TARGET DATE FOR ALL LTGS: 03/19/16   Status On-going     PT LONG TERM GOAL #2   Title Pt will be able to perform dynamic standing activities (weight shifting lat/post/ant and reaching forward 5") for 10 minutes in order to perform in band.    Status On-going     PT LONG TERM GOAL #3   Title Pt will verbalize understanding of fall prevention strategies to reduce falls risk.    Status On-going     PT LONG TERM GOAL #4   Title Pt will improve BERG score to >/=51/56 to decr. falls risk.    Status New               Plan - 02/18/16 1259    Clinical Impression Statement Pt met all STGs except Berg score was a little under goal score.  Pt continues to demonstrate the most safety when abulating with Roillator vs no AD.  Pt would like to continue therapy to progress towards LTGs .   Rehab Potential Good   Clinical Impairments Affecting Rehab Potential HOH, impaired vision, CABG in 2000, MI in 1991, CAD, a-fib, DM, CVA 05/2015 per pt but chart states 05/2014, Hx of R fibula fracture, arthritis, HTN, hyperlipidemia, angina   PT Frequency 2x / week   PT Duration 8 weeks   PT Treatment/Interventions ADLs/Self Care Home Management;Biofeedback;Canalith Repostioning;Neuromuscular re-education;Balance training;Therapeutic exercise;Therapeutic activities;Manual techniques;Functional mobility training;Stair  training;Gait training;DME Instruction;Orthotic Fit/Training;Patient/family education;Vestibular   PT Next Visit Plan  High level balance and gait training over uneven surfaces, trial SPC.    Consulted and Agree with Plan of Care Patient      Patient will benefit from skilled therapeutic intervention in order to improve the following deficits and impairments:  Abnormal gait, Decreased endurance, Decreased knowledge of use of DME, Decreased strength, Pain, Impaired sensation, Decreased balance, Decreased mobility, Impaired vision/preception, Dizziness, Decreased cognition, Decreased coordination, Postural dysfunction  Visit Diagnosis: Muscle weakness (generalized)  Other abnormalities of gait and mobility     Problem List Patient Active Problem List   Diagnosis Date Noted  . CVA (cerebral infarction) 01-25-202016  . Cerebral embolism with cerebral infarction ([place]) 01-25-202016  . Fall   . Stroke ([place])   . UTI (lower urinary tract infection) 05/29/2014  . Fracture of distal end of right fibula 05/29/2014  . Rhabdomyolysis 05/29/2014  . Transaminitis 05/29/2014  . Weakness of both lower extremities 01/11/2014  . Pain of left lower extremity- and Right 01/11/2014  . Aftercare following surgery of the circulatory system, [place] 01/11/2014  . Peripheral vascular disease, unspecified 01/11/2014  . Cellulitis of great toe, right 08/23/2013  . CAD (coronary artery disease)   . Hypertensive heart disease without CHF   . Paroxysmal atrial fibrillation   . Diabetes mellitus type 2 with peripheral artery disease ([place])   . Obesity (BMI 30-39.9)   . Hyperlipidemia   . Atherosclerosis of native arteries of extremity with intermittent claudication ([place])   . History of GI bleed 04/21/2009  . S/P CABG (coronary artery bypass graft) 11/20/1998    Bjorn Loser, PTA  02/18/16, 1:05 PM [facility] [address] [place], [place],  [address] Phone: [phone]   Fax:  [phone]  Name: Kristina Summers MRN: [id number]  Date of Birth: August 14, 1935

## 2016-02-20 ENCOUNTER — Ambulatory Visit: Payer: Medicare Other

## 2016-02-24 ENCOUNTER — Ambulatory Visit: Payer: Medicare Other

## 2016-02-26 ENCOUNTER — Ambulatory Visit: Payer: Medicare Other

## 2016-02-26 DIAGNOSIS — M6281 Muscle weakness (generalized): Secondary | ICD-10-CM

## 2016-02-26 DIAGNOSIS — R2689 Other abnormalities of gait and mobility: Secondary | ICD-10-CM

## 2016-02-26 NOTE — Therapy (Signed)
[facility] [address] [place], [place], [address] Phone: [phone]   Fax:  [phone]  Physical Therapy Treatment  Patient Details  Name: Kristina Summers MRN: [id number] Date of Birth: April 08, 1936 Referring Provider: Dr. Lucia Gaskins  Encounter Date: 02/26/2016      PT End of Session - 02/26/16 1203    Visit Number 7   Number of Visits 17   Date for PT Re-Evaluation 03/23/16   Authorization Type G-CODE EVERY 10TH VISIT.   PT Start Time 1021   PT Stop Time 1100   PT Time Calculation (min) 39 min   Equipment Utilized During Treatment Gait belt   Activity Tolerance Patient tolerated treatment well   Behavior During Therapy WFL for tasks assessed/performed      Past Medical History:  Diagnosis Date  . Angina   . Arthritis   . Atrial fibrillation ([facility])   . CAD (coronary artery disease)   . Carotid artery occlusion   . Diabetes mellitus   . Fibromyalgia   . GERD (gastroesophageal reflux disease)   . GI bleed 1/11  . Hyperlipidemia   . Hypertension   . Myocardial infarction ~ 1991  . Normal nuclear stress test 03/18/2011  . Obesity   . Peripheral vascular disease ([facility])   . Stroke [facility])    May 24, 2014  . UTI (lower urinary tract infection) Sept. 2015    Past Surgical History:  Procedure Laterality Date  . ABDOMINAL AORTAGRAM N/A 04/01/2011   Procedure: ABDOMINAL AORTAGRAM;  Surgeon: Fransisco Hertz, MD;  Location: [facility] CATH LAB;  Service: Cardiovascular;  Laterality: N/A;  . ANGIOPLASTY  06/17/11   Left leg common femoral artery cannulation under u/s Left leg runoff  . CATARACT EXTRACTION W/ INTRAOCULAR LENS  IMPLANT, BILATERAL  2004-2005  . CORONARY ARTERY BYPASS GRAFT  2000   CABG X5  . EYE SURGERY    . LOWER EXTREMITY ANGIOGRAM Bilateral 04/01/2011   Procedure: LOWER EXTREMITY ANGIOGRAM;  Surgeon: Fransisco Hertz, MD;  Location: [facility] CATH LAB;  Service: Cardiovascular;  Laterality: Bilateral;  . LOWER EXTREMITY  ANGIOGRAM Left 06/17/2011   Procedure: LOWER EXTREMITY ANGIOGRAM;  Surgeon: Fransisco Hertz, MD;  Location: [facility] CATH LAB;  Service: Cardiovascular;  Laterality: Left;  . LOWER EXTREMITY ANGIOGRAM N/A 11/18/2011   Procedure: LOWER EXTREMITY ANGIOGRAM;  Surgeon: Fransisco Hertz, MD;  Location: [facility] CATH LAB;  Service: Cardiovascular;  Laterality: N/A;  . PERCUTANEOUS STENT INTERVENTION Right 04/01/2011   Procedure: PERCUTANEOUS STENT INTERVENTION;  Surgeon: Fransisco Hertz, MD;  Location: [facility] CATH LAB;  Service: Cardiovascular;  Laterality: Right;  rt iliac stent  . PERIPHERAL ARTERIAL STENT GRAFT  04/01/11   right common iliac  . TRIGGER FINGER RELEASE  1996   right thumb    There were no vitals filed for this visit.      Subjective Assessment - 02/26/16 1025    Subjective Pt denied falls since last visit. Pt cancelled last few appt's as her dtr was in town and then she forgot to schedule [employer] for transportation. Pt reports her eyes "are a little worse today".   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 No/denies  [facility] Adult PT Treatment/Exercise - 02/26/16 1028      Ambulation/Gait   Ambulation/Gait Yes   Ambulation/Gait Assistance 5: Supervision;4: Min guard;4: Min assist   Ambulation/Gait Assistance Details Cues and demo for technique and sequencing with SPC.  Pt progressed from // bars with SPC and UE support on bar to amb. outside of // bars over even terrain.    Ambulation Distance (Feet) 50 Feet  x2, 230', 7'x20 in // bars all SPC, 75x3 with rollator   Assistive device Rollator;Straight cane   Gait Pattern Step-through pattern;Decreased stride  length;Decreased weight shift to right;Decreased dorsiflexion - right;Decreased trunk rotation   Ambulation Surface Level;Indoor                PT Education - 02/26/16 1202    Education provided Yes   Education Details PT educated pt on sequencing with SPC and asked pt to bring her new SPC to next session to ensure it is properly fitted for pt.    Person(s) Educated Patient   Methods Explanation;Demonstration;Verbal cues;Tactile cues   Comprehension Verbalized understanding;Returned demonstration;Need further instruction          PT Short Term Goals - 02/18/16 1256      PT SHORT TERM GOAL #1   Title Pt will be IND in HEP to improve strength and balance. TARGET DATE FOR ALL STGS: 02/20/16   Baseline Met; 02/18/16   Status Achieved     PT SHORT TERM GOAL #2   Title Perform BERG and write goals prn.   Status Achieved     PT SHORT TERM GOAL #3   Title Pt will perform TUG with no AD in</=13.5sec. to decr. falls risk.   Baseline 12.94 without AD; 02/18/16.   Status Achieved     PT SHORT TERM GOAL #4   Title Pt will amb. 500' with LRAD at MOD I level, over even terrain, to improve functional mobility.    Baseline Met with rollator; 02/18/16   Status Achieved     PT SHORT TERM GOAL #5   Title Pt will improve BERG score to >/=47/56 to decr. falls risk   Baseline 45/56; 02/18/16.   Status Not Met           PT Long Term Goals - 02/04/16 1033      PT LONG TERM GOAL #1   Title Pt will amb. 1000' over even/uneven terrain with LRAD in order to improve functional mobility and go to the store. TARGET DATE FOR ALL LTGS: 03/19/16   Status On-going     PT LONG TERM GOAL #2   Title Pt will be able to perform dynamic standing activities (weight shifting lat/post/ant and reaching forward 5") for 10 minutes in order to perform in band.    Status On-going     PT LONG TERM GOAL #3   Title Pt will verbalize understanding of fall prevention strategies to reduce falls risk.    Status  On-going     PT LONG TERM GOAL #4   Title Pt will improve BERG score to >/=51/56 to decr. falls risk.    Status New               Plan - 02/26/16 1203    Clinical Impression Statement Pt progressed from requiring extensive cues (tactile and verbal) to sequencing with SPC, to requiring no cues at the end of session. PT educated pt on the importance of using rollator at all times for safety, but pt reported rollator does not  fit in apartment. Therefore, PT will spend extensive time to ensure safety during amb. with SPC. Continue with POC.    Rehab Potential Good   Clinical Impairments Affecting Rehab Potential HOH, impaired vision, CABG in 2000, MI in 1991, CAD, a-fib, DM, CVA 05/2015 per pt but chart states 05/2014, Hx of R fibula fracture, arthritis, HTN, hyperlipidemia, angina   PT Frequency 2x / week   PT Duration 8 weeks   PT Treatment/Interventions ADLs/Self Care Home Management;Biofeedback;Canalith Repostioning;Neuromuscular re-education;Balance training;Therapeutic exercise;Therapeutic activities;Manual techniques;Functional mobility training;Stair training;Gait training;DME Instruction;Orthotic Fit/Training;Patient/family education;Vestibular   PT Next Visit Plan Pt to bring SPC to next session (assess height and adjust prn). Sequencing with SPC   Consulted and Agree with Plan of Care Patient      Patient will benefit from skilled therapeutic intervention in order to improve the following deficits and impairments:  Abnormal gait, Decreased endurance, Decreased knowledge of use of DME, Decreased strength, Pain, Impaired sensation, Decreased balance, Decreased mobility, Impaired vision/preception, Dizziness, Decreased cognition, Decreased coordination, Postural dysfunction  Visit Diagnosis: Other abnormalities of gait and mobility  Muscle weakness (generalized)     Problem List Patient Active Problem List   Diagnosis Date Noted  . CVA (cerebral infarction) 09/06/2014  .  Cerebral embolism with cerebral infarction ([place]) 09/06/2014  . Fall   . Stroke ([place])   . UTI (lower urinary tract infection) 05/29/2014  . Fracture of distal end of right fibula 05/29/2014  . Rhabdomyolysis 05/29/2014  . Transaminitis 05/29/2014  . Weakness of both lower extremities 01/11/2014  . Pain of left lower extremity- and Right 01/11/2014  . Aftercare following surgery of the circulatory system, [place] 01/11/2014  . Peripheral vascular disease, unspecified 01/11/2014  . Cellulitis of great toe, right 08/23/2013  . CAD (coronary artery disease)   . Hypertensive heart disease without CHF   . Paroxysmal atrial fibrillation   . Diabetes mellitus type 2 with peripheral artery disease ([place])   . Obesity (BMI 30-39.9)   . Hyperlipidemia   . Atherosclerosis of native arteries of extremity with intermittent claudication ([place])   . History of GI bleed 04/21/2009  . S/P CABG (coronary artery bypass graft) 11/20/1998    Oreoluwa Aigner L 02/26/2016, 12:06 PM  [facility] [address] [place], [place], [address] Phone: [phone]   Fax:  [phone]  Name: Haddy Mullinax MRN: [id number] Date of Birth: Jun 01, 1935   Geoffry Paradise, PT,DPT 02/26/16 12:06 PM Phone: [phone] Fax: [phone]

## 2016-03-02 ENCOUNTER — Encounter (HOSPITAL_COMMUNITY): Payer: Self-pay

## 2016-03-02 ENCOUNTER — Emergency Department (HOSPITAL_COMMUNITY): Payer: Medicare Other

## 2016-03-02 ENCOUNTER — Ambulatory Visit: Payer: Medicare Other

## 2016-03-02 ENCOUNTER — Emergency Department (HOSPITAL_COMMUNITY)
Admission: EM | Admit: 2016-03-02 | Discharge: 2016-03-02 | Disposition: A | Discharge status: Home / Self Care | Payer: Medicare Other | Attending: Emergency Medicine | Admitting: Emergency Medicine

## 2016-03-02 DIAGNOSIS — I252 Old myocardial infarction: Secondary | ICD-10-CM | POA: Diagnosis not present

## 2016-03-02 DIAGNOSIS — Z8673 Personal history of transient ischemic attack (TIA), and cerebral infarction without residual deficits: Secondary | ICD-10-CM | POA: Insufficient documentation

## 2016-03-02 DIAGNOSIS — I10 Essential (primary) hypertension: Secondary | ICD-10-CM | POA: Diagnosis not present

## 2016-03-02 DIAGNOSIS — M6281 Muscle weakness (generalized): Secondary | ICD-10-CM

## 2016-03-02 DIAGNOSIS — Z7901 Long term (current) use of anticoagulants: Secondary | ICD-10-CM | POA: Diagnosis not present

## 2016-03-02 DIAGNOSIS — Z951 Presence of aortocoronary bypass graft: Secondary | ICD-10-CM | POA: Diagnosis not present

## 2016-03-02 DIAGNOSIS — M79641 Pain in right hand: Secondary | ICD-10-CM | POA: Insufficient documentation

## 2016-03-02 DIAGNOSIS — Z87891 Personal history of nicotine dependence: Secondary | ICD-10-CM | POA: Insufficient documentation

## 2016-03-02 DIAGNOSIS — Z794 Long term (current) use of insulin: Secondary | ICD-10-CM | POA: Insufficient documentation

## 2016-03-02 DIAGNOSIS — I2510 Atherosclerotic heart disease of native coronary artery without angina pectoris: Secondary | ICD-10-CM | POA: Insufficient documentation

## 2016-03-02 DIAGNOSIS — Z79899 Other long term (current) drug therapy: Secondary | ICD-10-CM | POA: Insufficient documentation

## 2016-03-02 DIAGNOSIS — R2689 Other abnormalities of gait and mobility: Secondary | ICD-10-CM

## 2016-03-02 MED ORDER — HYDROCODONE-ACETAMINOPHEN 5-325 MG PO TABS: 1.0000 | ORAL_TABLET | ORAL | 0 refills | Status: DC | PRN

## 2016-03-02 MED ORDER — DEXAMETHASONE SODIUM PHOSPHATE 10 MG/ML IJ SOLN
10.0000 mg | Freq: Once | INTRAMUSCULAR | Status: AC
  Administered 2016-03-02: 10 mg via INTRAMUSCULAR
  Filled 2016-03-02: qty 1

## 2016-03-02 MED ORDER — PREDNISONE 10 MG (21) PO TBPK: 10.0000 mg | ORAL_TABLET | Freq: Every day | ORAL | 0 refills | Status: DC

## 2016-03-02 MED ORDER — KETOROLAC TROMETHAMINE 30 MG/ML IJ SOLN
30.0000 mg | Freq: Once | INTRAMUSCULAR | Status: AC
  Administered 2016-03-02: 30 mg via INTRAMUSCULAR
  Filled 2016-03-02: qty 1

## 2016-03-02 NOTE — ED Provider Notes (Signed)
[facility] DEPT Provider Note   CSN: [id number] Arrival date & time: 03/02/16  1040     History   Chief Complaint Chief Complaint  Patient presents with  . Hand Pain    HPI Kristina Summers is a 80 y.o. female.  Pt presents with right hand pain.  She noticed it last night.  She denies any injury.  She said it hurts too bad to hold her kettle.  She went to PT today as scheduled and could not put her weight on her hand to assist with her PT.        Past Medical History:  Diagnosis Date  . Angina   . Arthritis   . Atrial fibrillation ([place])   . CAD (coronary artery disease)   . Carotid artery occlusion   . Diabetes mellitus   . Fibromyalgia   . GERD (gastroesophageal reflux disease)   . GI bleed 1/11  . Hyperlipidemia   . Hypertension   . Myocardial infarction ~ 1991  . Normal nuclear stress test 03/18/2011  . Obesity   . Peripheral vascular disease ([place])   . Stroke [facility])    May 24, 2014  . UTI (lower urinary tract infection) Sept. 2015    Patient Active Problem List   Diagnosis Date Noted  . CVA (cerebral infarction) 2020/02/2015  . Cerebral embolism with cerebral infarction ([place]) 2020/02/2015  . Fall   . Stroke ([place])   . UTI (lower urinary tract infection) 05/29/2014  . Fracture of distal end of right fibula 05/29/2014  . Rhabdomyolysis 05/29/2014  . Transaminitis 05/29/2014  . Weakness of both lower extremities 01/11/2014  . Pain of left lower extremity- and Right 01/11/2014  . Aftercare following surgery of the circulatory system, [place] 01/11/2014  . Peripheral vascular disease, unspecified 01/11/2014  . Cellulitis of great toe, right 08/23/2013  . CAD (coronary artery disease)   . Hypertensive heart disease without CHF   . Paroxysmal atrial fibrillation   . Diabetes mellitus type 2 with peripheral artery disease ([place])   . Obesity (BMI 30-39.9)   . Hyperlipidemia   . Atherosclerosis of native arteries of extremity with intermittent claudication ([place])     . History of GI bleed 04/21/2009  . S/P CABG (coronary artery bypass graft) 11/20/1998    Past Surgical History:  Procedure Laterality Date  . ABDOMINAL AORTAGRAM N/A 04/01/2011   Procedure: ABDOMINAL AORTAGRAM;  Surgeon: Conrad Uhland, MD;  Location: [facility] CATH LAB;  Service: Cardiovascular;  Laterality: N/A;  . ANGIOPLASTY  06/17/11   Left leg common femoral artery cannulation under u/s Left leg runoff  . CATARACT EXTRACTION W/ INTRAOCULAR LENS  IMPLANT, BILATERAL  2004-2005  . CORONARY ARTERY BYPASS GRAFT  2000   CABG X5  . EYE SURGERY    . LOWER EXTREMITY ANGIOGRAM Bilateral 04/01/2011   Procedure: LOWER EXTREMITY ANGIOGRAM;  Surgeon: Conrad [place], MD;  Location: [facility] CATH LAB;  Service: Cardiovascular;  Laterality: Bilateral;  . LOWER EXTREMITY ANGIOGRAM Left 06/17/2011   Procedure: LOWER EXTREMITY ANGIOGRAM;  Surgeon: Conrad Offerle, MD;  Location: [facility] CATH LAB;  Service: Cardiovascular;  Laterality: Left;  . LOWER EXTREMITY ANGIOGRAM N/A 11/18/2011   Procedure: LOWER EXTREMITY ANGIOGRAM;  Surgeon: Conrad [place], MD;  Location: [facility] CATH LAB;  Service: Cardiovascular;  Laterality: N/A;  . PERCUTANEOUS STENT INTERVENTION Right 04/01/2011   Procedure: PERCUTANEOUS STENT INTERVENTION;  Surgeon: Conrad [place], MD;  Location: [facility] CATH LAB;  Service: Cardiovascular;  Laterality: Right;  rt iliac stent  .  PERIPHERAL ARTERIAL STENT GRAFT  04/01/11   right common iliac  . TRIGGER FINGER RELEASE  1996   right thumb    OB History    No data available       Home Medications    Prior to Admission medications   Medication Sig Start Date End Date Taking? Authorizing Provider  acetaminophen (TYLENOL) 325 MG tablet Take 2 tablets (650 mg total) by mouth every 6 (six) hours as needed for mild pain (or Fever >/= 101). 08/27/13   Theodis Blaze, MD  amLODipine (NORVASC) 5 MG tablet Take 1 tablet by mouth daily. 03/06/12   Historical Provider, MD  apixaban (ELIQUIS) 5 MG TABS tablet Take 1 tablet (5 mg total)  by mouth 2 (two) times daily. 06/03/14   Ripudeep Krystal Eaton, MD  atorvastatin (LIPITOR) 80 MG tablet Take 1 tablet (80 mg total) by mouth daily. 11/27/12   Liam Graham, PA-C  furosemide (LASIX) 40 MG tablet Take 1 tablet (40 mg total) by mouth daily. Patient taking differently: Take 40 mg by mouth 2 (two) times daily.  06/03/14   Ripudeep Krystal Eaton, MD  HYDROcodone-acetaminophen (NORCO/VICODIN) 5-325 MG tablet Take 1 tablet by mouth every 4 (four) hours as needed. 03/02/16   Isla Pence, MD  insulin NPH Human (HUMULIN N,NOVOLIN N) 100 UNIT/ML injection Inject 0.38 mLs (38 Units total) into the skin 2 (two) times daily. 38 in am and 34 at night Patient taking differently: Inject 37 Units into the skin daily before breakfast. 37 in am and 32 at night 06/03/14   Ripudeep K Rai, MD  losartan (COZAAR) 50 MG tablet Take 1 tablet (50 mg total) by mouth daily. Patient taking differently: Take 50 mg by mouth 2 (two) times daily.  06/03/14   Ripudeep Krystal Eaton, MD  metoprolol tartrate (LOPRESSOR) 25 MG tablet Take 1 tablet by mouth 2 (two) times daily. 04/16/14   Historical Provider, MD  Multiple Vitamins-Minerals (ONE-A-DAY 50 PLUS PO) Take 1 tablet by mouth daily.    Historical Provider, MD  NITROSTAT 0.4 MG SL tablet Place 1 tablet under the tongue every 5 (five) minutes x 3 doses as needed. Chest pain 03/07/14   Historical Provider, MD  predniSONE (STERAPRED UNI-PAK 21 TAB) 10 MG (21) TBPK tablet Take 1 tablet (10 mg total) by mouth daily. Take 6 tabs by mouth daily  for 2 days, then 5 tabs for 2 days, then 4 tabs for 2 days, then 3 tabs for 2 days, 2 tabs for 2 days, then 1 tab by mouth daily for 2 days 03/02/16   Isla Pence, MD    Family History Family History  Problem Relation Age of Onset  . Other Brother     intestinal blockage  . Diabetes Brother     Social History Social History  Substance Use Topics  . Smoking status: Former Smoker    Packs/day: 2.00    Years: 30.00    Types: Cigarettes     Quit date: 04/19/1990  . Smokeless tobacco: Never Used     Comment: stopped smoking cigarettes 1991  . Alcohol use No     Allergies   Morphine and related; Lisinopril; Sertraline hcl; and Zoloft [sertraline hcl]   Review of Systems Review of Systems  Musculoskeletal:       Right hand pain  All other systems reviewed and are negative.    Physical Exam Updated Vital Signs BP 176/67 (BP Location: Left Arm)   Pulse 64   Temp 98  F (36.7 C) (Oral)   Resp 18   SpO2 98%   Physical Exam  Constitutional: She appears well-developed and well-nourished.  HENT:  Head: Normocephalic and atraumatic.  Right Ear: External ear normal.  Left Ear: External ear normal.  Nose: Nose normal.  Mouth/Throat: Oropharynx is clear and moist.  Eyes: Conjunctivae and EOM are normal. Pupils are equal, round, and reactive to light.  Neck: Normal range of motion. Neck supple.  Cardiovascular: Normal rate, regular rhythm, normal heart sounds and intact distal pulses.   Pulmonary/Chest: Effort normal and breath sounds normal.  Abdominal: Soft. Bowel sounds are normal.  Neurological: She is alert.  Right hand palm tender to palpation.  Decreased rom due to pain.  N/v intact.  Skin: Skin is warm.  Psychiatric: She has a normal mood and affect. Her behavior is normal. Judgment and thought content normal.  Nursing note and vitals reviewed.    ED Treatments / Results  Labs (all labs ordered are listed, but only abnormal results are displayed) Labs Reviewed - No data to display  EKG  EKG Interpretation None       Radiology Dg Hand Complete Right  Result Date: 03/02/2016 CLINICAL DATA:  Right thumb pain in the metacarpal region since last night without known injury EXAM: RIGHT HAND - COMPLETE 3+ VIEW COMPARISON:  None in PACs FINDINGS: The bones are subjectively mildly osteopenic. No acute or healing fracture is observed. The IP and MCP joint of the thumb exhibit only minimal joint space loss.  The first [facility] joint exhibits mild joint space loss but no eburnation of the articular surfaces. The other interphalangeal and metacarpophalangeal joints exhibit mild degenerative narrowing. No significant second through fifth CMC joint narrowing is observed. IMPRESSION: Mild osteoarthritic joint space loss of the first CMC joint. The first MCP joint and IP joint exhibit only minimal degenerative changes. There is no acute fracture or dislocation. Electronically Signed   By: David  Martinique M.D.   On: 03/02/2016 13:15    Procedures Procedures (including critical care time)  Medications Ordered in ED Medications  ketorolac (TORADOL) 30 MG/ML injection 30 mg (30 mg Intramuscular Given 03/02/16 1250)  dexamethasone (DECADRON) injection 10 mg (10 mg Intramuscular Given 03/02/16 1250)     Initial Impression / Assessment and Plan / ED Course  I have reviewed the triage vital signs and the nursing notes.  Pertinent labs & imaging results that were available during my care of the patient were reviewed by me and considered in my medical decision making (see chart for details).  Clinical Course    Xray ok other than arthritis.  ? Arthritis flare.  I will treat with prednisone and splint.  She will be referred to Dr. Amedeo Plenty who is on call for hand.    Final Clinical Impressions(s) / ED Diagnoses   Final diagnoses:  Right hand pain    New Prescriptions New Prescriptions   HYDROCODONE-ACETAMINOPHEN (NORCO/VICODIN) 5-325 MG TABLET    Take 1 tablet by mouth every 4 (four) hours as needed.   PREDNISONE (STERAPRED UNI-PAK 21 TAB) 10 MG (21) TBPK TABLET    Take 1 tablet (10 mg total) by mouth daily. Take 6 tabs by mouth daily  for 2 days, then 5 tabs for 2 days, then 4 tabs for 2 days, then 3 tabs for 2 days, 2 tabs for 2 days, then 1 tab by mouth daily for 2 days     Isla Pence, MD 03/02/16 1334

## 2016-03-02 NOTE — Therapy (Signed)
[facility] [address] [place], [place], [address] Phone: [phone]   Fax:  [phone]  Physical Therapy Treatment  Patient Details  Name: Kristina Summers MRN: [id number] Date of Birth: 04/21/1935 Referring Provider: [name]  Encounter Date: 03/02/2016      PT End of Session - 03/02/16 0819    Visit Number 7  No charge today   Number of Visits 17   Date for PT Re-Evaluation 03/23/16   Authorization Type G-CODE EVERY 10TH VISIT.   PT Start Time 0804   PT Stop Time 0815   PT Time Calculation (min) 11 min   Activity Tolerance Patient limited by pain      Past Medical History:  Diagnosis Date  . Angina   . Arthritis   . Atrial fibrillation ([place])   . CAD (coronary artery disease)   . Carotid artery occlusion   . Diabetes mellitus   . Fibromyalgia   . GERD (gastroesophageal reflux disease)   . GI bleed 1/11  . Hyperlipidemia   . Hypertension   . Myocardial infarction ~ 1991  . Normal nuclear stress test 03/18/2011  . Obesity   . Peripheral vascular disease ([place])   . Stroke [facility])    May 24, 2014  . UTI (lower urinary tract infection) Sept. 2015    Past Surgical History:  Procedure Laterality Date  . ABDOMINAL AORTAGRAM N/A 04/01/2011   Procedure: ABDOMINAL AORTAGRAM;  Surgeon: Conrad [place], MD;  Location: [facility] CATH LAB;  Service: Cardiovascular;  Laterality: N/A;  . ANGIOPLASTY  06/17/11   Left leg common femoral artery cannulation under u/s Left leg runoff  . CATARACT EXTRACTION W/ INTRAOCULAR LENS  IMPLANT, BILATERAL  2004-2005  . CORONARY ARTERY BYPASS GRAFT  2000   CABG X5  . EYE SURGERY    . LOWER EXTREMITY ANGIOGRAM Bilateral 04/01/2011   Procedure: LOWER EXTREMITY ANGIOGRAM;  Surgeon: Conrad Beckley, MD;  Location: [facility] CATH LAB;  Service: Cardiovascular;  Laterality: Bilateral;  . LOWER EXTREMITY ANGIOGRAM Left 06/17/2011   Procedure: LOWER EXTREMITY ANGIOGRAM;  Surgeon: Conrad [place], MD;   Location: [facility] CATH LAB;  Service: Cardiovascular;  Laterality: Left;  . LOWER EXTREMITY ANGIOGRAM N/A 11/18/2011   Procedure: LOWER EXTREMITY ANGIOGRAM;  Surgeon: Conrad [place], MD;  Location: [facility] CATH LAB;  Service: Cardiovascular;  Laterality: N/A;  . PERCUTANEOUS STENT INTERVENTION Right 04/01/2011   Procedure: PERCUTANEOUS STENT INTERVENTION;  Surgeon: Conrad , MD;  Location: [facility] CATH LAB;  Service: Cardiovascular;  Laterality: Right;  rt iliac stent  . PERIPHERAL ARTERIAL STENT GRAFT  04/01/11   right common iliac  . TRIGGER FINGER RELEASE  1996   right thumb    There were no vitals filed for this visit.      Subjective Assessment - 03/02/16 0808    Subjective Pt reported her R hand is really bothering her (pain started Sunday), therefore, she didn't bring the [facility] as it incr. hand pain to put pressure through hand. Pt denied falls or injury prior to onset of pain. Pt stated she was dizzy on Saturday morning, describes dizziness as spinning, she had to hold onto furniture to walk. Dizziness lasted about half a day and reports dizziness is gone now.    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 Yes   Pain Score 8    Pain Location Hand   Pain Orientation Right   Pain Descriptors / Indicators Aching;Dull;Sharp   Pain Type Acute pain   Pain Onset In the past 7 days   Aggravating Factors  finger flex/ext and wrist flex/ext and TTP   Pain Relieving Factors holding it                                   PT Short Term Goals - 02/18/16 1256      PT SHORT TERM GOAL #1   Title Pt will be IND in HEP to improve strength and balance. TARGET DATE FOR ALL STGS:  02/20/16   Baseline Met; 02/18/16   Status Achieved     PT SHORT TERM GOAL #2   Title Perform BERG and write goals prn.   Status Achieved     PT SHORT TERM GOAL #3   Title Pt will perform TUG with no AD in</=13.5sec. to decr. falls risk.   Baseline 12.94 without AD; 02/18/16.   Status Achieved     PT SHORT TERM GOAL #4   Title Pt will amb. 500' with LRAD at MOD I level, over even terrain, to improve functional mobility.    Baseline Met with rollator; 02/18/16   Status Achieved     PT SHORT TERM GOAL #5   Title Pt will improve BERG score to >/=47/56 to decr. falls risk   Baseline 45/56; 02/18/16.   Status Not Met           PT Long Term Goals - 02/04/16 1033      PT LONG TERM GOAL #1   Title Pt will amb. 1000' over even/uneven terrain with LRAD in order to improve functional mobility and go to the store. TARGET DATE FOR ALL LTGS: 03/19/16   Status On-going     PT LONG TERM GOAL #2   Title Pt will be able to perform dynamic standing activities (weight shifting lat/post/ant and reaching forward 5") for 10 minutes in order to perform in band.    Status On-going     PT LONG TERM GOAL #3   Title Pt will verbalize understanding of fall prevention strategies to reduce falls risk.    Status On-going     PT LONG TERM GOAL #4   Title Pt will improve BERG score to >/=51/56 to decr. falls risk.    Status New               Plan - 03/02/16 0820    Clinical Impression Statement No charge for today's visit, 2/2 intense R hand pain. Pt reported pain with wrist and finger flex/ext and TTP to first MCP joint. Pt unable to bear weight through hand (pt using dorsum of hand to guide rollator). PT educated pt on the importance of notifying MD and making an appt to see MD today, to determine etiology of R hand pain. No charge for visit, as PT focuses on pt's balance and gait with AD and pt unable to tolerate weight bearing through dominant R hand. PT will send note to Dr. Marlou Sa, pt agreeable.  PT walked with pt to lobby, to ensure safety.   Rehab Potential Good   Clinical Impairments Affecting Rehab Potential HOH, impaired vision, CABG in 2000,  MI in 1991, CAD, a-fib, DM, CVA 05/2015 per pt but chart states 05/2014, Hx of R fibula fracture, arthritis, HTN, hyperlipidemia, angina   PT Frequency 2x / week   PT Duration 8 weeks   PT Treatment/Interventions ADLs/Self Care Home Management;Biofeedback;Canalith Repostioning;Neuromuscular re-education;Balance training;Therapeutic exercise;Therapeutic activities;Manual techniques;Functional mobility training;Stair training;Gait training;DME Instruction;Orthotic Fit/Training;Patient/family education;Vestibular   PT Next Visit Plan Based on R hand pain. Pt to bring SPC to next session (assess height and adjust prn). Sequencing with SPC. Assess for dizziness prn.    Consulted and Agree with Plan of Care Patient      Patient will benefit from skilled therapeutic intervention in order to improve the following deficits and impairments:  Abnormal gait, Decreased endurance, Decreased knowledge of use of DME, Decreased strength, Pain, Impaired sensation, Decreased balance, Decreased mobility, Impaired vision/preception, Dizziness, Decreased cognition, Decreased coordination, Postural dysfunction  Visit Diagnosis: Other abnormalities of gait and mobility  Muscle weakness (generalized)     Problem List Patient Active Problem List   Diagnosis Date Noted  . CVA (cerebral infarction) 03-31-2015  . Cerebral embolism with cerebral infarction ([place]) 03-31-2015  . Fall   . Stroke ([place])   . UTI (lower urinary tract infection) 05/29/2014  . Fracture of distal end of right fibula 05/29/2014  . Rhabdomyolysis 05/29/2014  . Transaminitis 05/29/2014  . Weakness of both lower extremities 01/11/2014  . Pain of left lower extremity- and Right 01/11/2014  . Aftercare following surgery of the circulatory system, [place] 01/11/2014  . Peripheral vascular disease,  unspecified 01/11/2014  . Cellulitis of great toe, right 08/23/2013  . CAD (coronary artery disease)   . Hypertensive heart disease without CHF   . Paroxysmal atrial fibrillation   . Diabetes mellitus type 2 with peripheral artery disease ([place])   . Obesity (BMI 30-39.9)   . Hyperlipidemia   . Atherosclerosis of native arteries of extremity with intermittent claudication ([place])   . History of GI bleed 04/21/2009  . S/P CABG (coronary artery bypass graft) 11/20/1998    Corbyn Wildey L 03/02/2016, 8:26 AM  [facility] [address] [place], [place], [address] Phone: [phone]   Fax:  [phone]  Name: Kristina Summers MRN: [id number] Date of Birth: [birth year]   Geoffry Paradise, PT,DPT 03/02/16 8:27 AM Phone: [phone] Fax: [phone]

## 2016-03-02 NOTE — ED Triage Notes (Signed)
Patient complains of right hand pain x 1 day. States that she has pain with any ROM. Denies injury. No swelling, no redness

## 2016-03-05 ENCOUNTER — Ambulatory Visit: Payer: Medicare Other

## 2016-03-09 ENCOUNTER — Ambulatory Visit: Payer: Medicare Other

## 2016-03-09 DIAGNOSIS — R2689 Other abnormalities of gait and mobility: Secondary | ICD-10-CM

## 2016-03-09 DIAGNOSIS — M6281 Muscle weakness (generalized): Secondary | ICD-10-CM | POA: Diagnosis not present

## 2016-03-09 NOTE — Therapy (Signed)
[facility] [address] [place], [place], [address] Phone: [phone]   Fax:  [phone]  Physical Therapy Treatment  Patient Details  Name: Kristina Summers MRN: [id number] Date of Birth: [birth year] Referring Provider: [name]  Encounter Date: 03/09/2016      PT End of Session - 03/09/16 0853    Visit Number 8   Number of Visits 17   Date for PT Re-Evaluation 03/23/16   Authorization Type G-CODE EVERY 10TH VISIT.   PT Start Time 0802   PT Stop Time [phone]   PT Time Calculation (min) 41 min   Equipment Utilized During Treatment Gait belt   Activity Tolerance Patient tolerated treatment well   Behavior During Therapy WFL for tasks assessed/performed      Past Medical History:  Diagnosis Date  . Angina   . Arthritis   . Atrial fibrillation ([place])   . CAD (coronary artery disease)   . Carotid artery occlusion   . Diabetes mellitus   . Fibromyalgia   . GERD (gastroesophageal reflux disease)   . GI bleed 1/11  . Hyperlipidemia   . Hypertension   . Myocardial infarction ~ 1991  . Normal nuclear stress test 03/18/2011  . Obesity   . Peripheral vascular disease ([place])   . Stroke [facility])    May 24, 2014  . UTI (lower urinary tract infection) Sept. 2015    Past Surgical History:  Procedure Laterality Date  . ABDOMINAL AORTAGRAM N/A 04/01/2011   Procedure: ABDOMINAL AORTAGRAM;  Surgeon: Conrad [place], MD;  Location: [facility] CATH LAB;  Service: Cardiovascular;  Laterality: N/A;  . ANGIOPLASTY  06/17/11   Left leg common femoral artery cannulation under u/s Left leg runoff  . CATARACT EXTRACTION W/ INTRAOCULAR LENS  IMPLANT, BILATERAL  2004-2005  . CORONARY ARTERY BYPASS GRAFT  2000   CABG X5  . EYE SURGERY    . LOWER EXTREMITY ANGIOGRAM Bilateral 04/01/2011   Procedure: LOWER EXTREMITY ANGIOGRAM;  Surgeon: Conrad [place], MD;  Location[facility] CATH LAB;  Service: Cardiovascular;  Laterality: Bilateral;  . LOWER EXTREMITY  ANGIOGRAM Left 06/17/2011   Procedure: LOWER EXTREMITY ANGIOGRAM;  Surgeon: Conrad [place], MD;  Location: [facility] CATH LAB;  Service: Cardiovascular;  Laterality: Left;  . LOWER EXTREMITY ANGIOGRAM N/A 11/18/2011   Procedure: LOWER EXTREMITY ANGIOGRAM;  Surgeon: Conrad [place], MD;  Location: [facility] CATH LAB;  Service: Cardiovascular;  Laterality: N/A;  . PERCUTANEOUS STENT INTERVENTION Right 04/01/2011   Procedure: PERCUTANEOUS STENT INTERVENTION;  Surgeon: Conrad [place], MD;  Location: [facility] CATH LAB;  Service: Cardiovascular;  Laterality: Right;  rt iliac stent  . PERIPHERAL ARTERIAL STENT GRAFT  04/01/11   right common iliac  . TRIGGER FINGER RELEASE  1996   right thumb    There were no vitals filed for this visit.      Subjective Assessment - 03/09/16 0805    Subjective Pt reported she went to the ED after last visit and was told it could be osteoporosis or inflammatory arthritis but that pt did not have fx's. Pt reported she feels better and is taking the prednisone MD prescribed but not the hydrocodone.  Pt reports blood sugar has been elevated on prednisone.  Pt has R wrist splint to decr. pain. Pt denied dizziness.   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 Yes   Pain Score --  1-2/10   Pain Location Hand   Pain Orientation Right   Pain Descriptors / Indicators Aching   Pain Type Acute pain   Pain Onset 1 to 4 weeks ago   Pain Frequency Constant   Aggravating Factors  nothing in particular   Pain Relieving Factors medication                         [facility] Adult PT Treatment/Exercise - 03/09/16 0811      Ambulation/Gait   Ambulation/Gait Yes   Ambulation/Gait  Assistance 5: Supervision;4: Min guard   Ambulation/Gait Assistance Details Pt amb. over even and red mat surfaces, while traversing obstacles and narrow spaces with SPC.  Pt also performed modified step activity by traversing 2" step with cues and demo for proper technique.   Ambulation Distance (Feet) 115 Feet  x2, 50'x2, 230' even terrain and 10x7' over red mat, pt also negotiated around and through 4 cones x2 reps.   Assistive device Straight cane   Gait Pattern Step-through pattern;Decreased stride length;Decreased weight shift to right;Decreased dorsiflexion - right;Decreased trunk rotation   Ambulation Surface Level;Indoor   Ramp Other (comment)  min guard   Ramp Details (indicate cue type and reason) Cues and demo for proper wt. shifting technique. Performed x2 reps.                PT Education - 03/09/16 [phone]    Education provided Yes   Education Details PT educated pt on always using SPC at home and rollator in the community to ensure safety. PT discussed the importance of informing MD if blood sugar levels are elevated. PT discussed likely d/c next week due to progress.   Person(s) Educated Patient   Methods Explanation   Comprehension Verbalized understanding          PT Short Term Goals - 02/18/16 1256      PT SHORT TERM GOAL #1   Title Pt will be IND in HEP to improve strength and balance. TARGET DATE FOR ALL STGS: 02/20/16   Baseline Met; 02/18/16   Status Achieved     PT SHORT TERM GOAL #2   Title Perform BERG and write goals prn.   Status Achieved     PT SHORT TERM GOAL #3   Title Pt will perform TUG with no AD in</=13.5sec. to decr. falls risk.   Baseline 12.94 without AD; 02/18/16.   Status Achieved     PT SHORT TERM GOAL #4   Title Pt will amb. 500' with LRAD at MOD I level, over even terrain, to improve functional mobility.    Baseline Met with rollator; 02/18/16   Status Achieved     PT SHORT TERM GOAL #5   Title Pt will improve BERG score to  >/=47/56 to decr. falls risk   Baseline 45/56; 02/18/16.   Status Not Met           PT Long Term Goals - 02/04/16 1033      PT LONG TERM GOAL #1   Title Pt will amb. 1000' over even/uneven terrain with LRAD in order to improve functional mobility and go to the store. TARGET DATE FOR ALL LTGS: 03/19/16   Status On-going     PT LONG TERM GOAL #2  Title Pt will be able to perform dynamic standing activities (weight shifting lat/post/ant and reaching forward 5") for 10 minutes in order to perform in band.    Status On-going     PT LONG TERM GOAL #3   Title Pt will verbalize understanding of fall prevention strategies to reduce falls risk.    Status On-going     PT LONG TERM GOAL #4   Title Pt will improve BERG score to >/=51/56 to decr. falls risk.    Status New               Plan - 03/09/16 8242    Clinical Impression Statement Pt demonstrated progress as she was able to amb. over even terrain with SPC and less cues. Pt continues to require extensive cues to traverse ramp and 2" step to ensure proper technique and safety. Pt would continue to benefit from skilled PT to improve safety during functional mobility.    Rehab Potential Good   Clinical Impairments Affecting Rehab Potential HOH, impaired vision, CABG in 2000, MI in 1991, CAD, a-fib, DM, CVA 05/2015 per pt but chart states 05/2014, Hx of R fibula fracture, arthritis, HTN, hyperlipidemia, angina   PT Frequency 2x / week   PT Duration 8 weeks   PT Treatment/Interventions ADLs/Self Care Home Management;Biofeedback;Canalith Repostioning;Neuromuscular re-education;Balance training;Therapeutic exercise;Therapeutic activities;Manual techniques;Functional mobility training;Stair training;Gait training;DME Instruction;Orthotic Fit/Training;Patient/family education;Vestibular   PT Next Visit Plan Gait with SPC over uneven terrain, curb, and stairs. B LE strength training and balance training.    Consulted and Agree with Plan of  Care Patient      Patient will benefit from skilled therapeutic intervention in order to improve the following deficits and impairments:  Abnormal gait, Decreased endurance, Decreased knowledge of use of DME, Decreased strength, Pain, Impaired sensation, Decreased balance, Decreased mobility, Impaired vision/preception, Dizziness, Decreased cognition, Decreased coordination, Postural dysfunction  Visit Diagnosis: Other abnormalities of gait and mobility     Problem List Patient Active Problem List   Diagnosis Date Noted  . CVA (cerebral infarction) 07-17-202016  . Cerebral embolism with cerebral infarction ([place]) 07-17-202016  . Fall   . Stroke ([place])   . UTI (lower urinary tract infection) 05/29/2014  . Fracture of distal end of right fibula 05/29/2014  . Rhabdomyolysis 05/29/2014  . Transaminitis 05/29/2014  . Weakness of both lower extremities 01/11/2014  . Pain of left lower extremity- and Right 01/11/2014  . Aftercare following surgery of the circulatory system, [place] 01/11/2014  . Peripheral vascular disease, unspecified 01/11/2014  . Cellulitis of great toe, right 08/23/2013  . CAD (coronary artery disease)   . Hypertensive heart disease without CHF   . Paroxysmal atrial fibrillation   . Diabetes mellitus type 2 with peripheral artery disease ([place])   . Obesity (BMI 30-39.9)   . Hyperlipidemia   . Atherosclerosis of native arteries of extremity with intermittent claudication ([place])   . History of GI bleed 04/21/2009  . S/P CABG (coronary artery bypass graft) 11/20/1998    Tajae Rybicki L 03/09/2016, 8:56 AM  [facility] [address] [place], [place], [address] Phone: [phone]   Fax:  [phone]  Name: Kristina Summers MRN: [id number] Date of Birth: February 17, 1936   Geoffry Paradise, PT,DPT 03/09/16 8:57 AM Phone: [phone] Fax: [phone]

## 2016-03-10 ENCOUNTER — Encounter: Payer: Self-pay | Admitting: Physical Therapy

## 2016-03-10 ENCOUNTER — Ambulatory Visit: Payer: Medicare Other | Admitting: Physical Therapy

## 2016-03-10 DIAGNOSIS — M6281 Muscle weakness (generalized): Secondary | ICD-10-CM | POA: Diagnosis not present

## 2016-03-10 DIAGNOSIS — R2689 Other abnormalities of gait and mobility: Secondary | ICD-10-CM

## 2016-03-10 NOTE — Therapy (Signed)
[facility] [address] [place], [place], [address] Phone: [phone]   Fax:  [phone]  Physical Therapy Treatment  Patient Details  Name: Kristina Summers MRN: [id number] Date of Birth: 1935-06-06 Referring Provider: [name]  Encounter Date: 03/10/2016      PT End of Session - 03/10/16 0930    Visit Number 9   Number of Visits 17   Date for PT Re-Evaluation 03/23/16   Authorization Type G-CODE EVERY 10TH VISIT.   PT Start Time [phone]   PT Stop Time 0930   PT Time Calculation (min) 40 min   Equipment Utilized During Treatment Gait belt   Activity Tolerance Patient tolerated treatment well   Behavior During Therapy WFL for tasks assessed/performed      Past Medical History:  Diagnosis Date  . Angina   . Arthritis   . Atrial fibrillation ([place])   . CAD (coronary artery disease)   . Carotid artery occlusion   . Diabetes mellitus   . Fibromyalgia   . GERD (gastroesophageal reflux disease)   . GI bleed 1/11  . Hyperlipidemia   . Hypertension   . Myocardial infarction ~ 1991  . Normal nuclear stress test 03/18/2011  . Obesity   . Peripheral vascular disease ([place])   . Stroke [facility])    May 24, 2014  . UTI (lower urinary tract infection) Sept. 2015    Past Surgical History:  Procedure Laterality Date  . ABDOMINAL AORTAGRAM N/A 04/01/2011   Procedure: ABDOMINAL AORTAGRAM;  Surgeon: Conrad [place], MD;  Location: [facility] CATH LAB;  Service: Cardiovascular;  Laterality: N/A;  . ANGIOPLASTY  06/17/11   Left leg common femoral artery cannulation under u/s Left leg runoff  . CATARACT EXTRACTION W/ INTRAOCULAR LENS  IMPLANT, BILATERAL  2004-2005  . CORONARY ARTERY BYPASS GRAFT  2000   CABG X5  . EYE SURGERY    . LOWER EXTREMITY ANGIOGRAM Bilateral 04/01/2011   Procedure: LOWER EXTREMITY ANGIOGRAM;  Surgeon: Conrad [place], MD;  Location: [facility] CATH LAB;  Service: Cardiovascular;  Laterality: Bilateral;  . LOWER EXTREMITY  ANGIOGRAM Left 06/17/2011   Procedure: LOWER EXTREMITY ANGIOGRAM;  Surgeon: Conrad [place], MD;  Location: [facility] CATH LAB;  Service: Cardiovascular;  Laterality: Left;  . LOWER EXTREMITY ANGIOGRAM N/A 11/18/2011   Procedure: LOWER EXTREMITY ANGIOGRAM;  Surgeon: Conrad [place], MD;  Location: [facility] CATH LAB;  Service: Cardiovascular;  Laterality: N/A;  . PERCUTANEOUS STENT INTERVENTION Right 04/01/2011   Procedure: PERCUTANEOUS STENT INTERVENTION;  Surgeon: Conrad [place], MD;  Location: [facility] CATH LAB;  Service: Cardiovascular;  Laterality: Right;  rt iliac stent  . PERIPHERAL ARTERIAL STENT GRAFT  04/01/11   right common iliac  . TRIGGER FINGER RELEASE  1996   right thumb    There were no vitals filed for this visit.      Subjective Assessment - 03/10/16 0853    Subjective "It's only about a 1," Pt regarding pain in hand.   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 No/denies   Pain Score 1    Pain Location Hand   Pain Orientation Right   Pain Descriptors / Indicators Aching  Pain Type Acute pain   Pain Onset 1 to 4 weeks ago   Pain Frequency Constant   Multiple Pain Sites Yes   Pain Score 3   Pain Location Back   Pain Orientation Lower   Pain Descriptors / Indicators Sore   Pain Type Chronic pain   Pain Onset More than a month ago   Pain Frequency Intermittent   Aggravating Factors  Walking                         [facility] Adult PT Treatment/Exercise - 03/10/16 0001      Ambulation/Gait   Ambulation/Gait Yes   Ambulation/Gait Assistance 6: Modified independent (Device/Increase time)   Ambulation/Gait Assistance Details Rollator for community ambulation and SPC for  household   Ambulation Distance (Feet) 115 Feet   Assistive device Rolling walker;Straight cane   Gait Pattern Step-through pattern;Decreased stride length;Decreased weight shift to right;Decreased dorsiflexion - right;Decreased trunk rotation   Ambulation Surface Level;Indoor   Stairs Yes   Stairs Assistance 5: Supervision   Stair Management Technique Two rails;Alternating pattern;Step to pattern  Alt up and step to going down   Number of Stairs 4  x2   Ramp 6: Modified independent (Device)   Ramp Details (indicate cue type and reason) with rollator, safe technique   Curb 6: Modified independent (Device/increase time)   Pre-Gait Activities rollator demonstrating safe technique             Balance Exercises - 03/10/16 0919      Balance Exercises: Standing   Stepping Strategy Anterior;Lateral  no UE support, leading with each LE. + Feet apart- Upper trunk roations without UE support   Retro Gait 4 reps   With [facility]   Sidestepping 3 reps  with SPC   Turning Both;5 reps  figure 8 pattern with cane.           PT Education - 03/10/16 0908    Education provided Yes   Education Details Reviewed fall prevention for home activities.   Person(s) Educated Patient   Methods Explanation   Comprehension Verbalized understanding          PT Short Term Goals - 02/18/16 1256      PT SHORT TERM GOAL #1   Title Pt will be IND in HEP to improve strength and balance. TARGET DATE FOR ALL STGS: 02/20/16   Baseline Met; 02/18/16   Status Achieved     PT SHORT TERM GOAL #2   Title Perform BERG and write goals prn.   Status Achieved     PT SHORT TERM GOAL #3   Title Pt will perform TUG with no AD in</=13.5sec. to decr. falls risk.   Baseline 12.94 without AD; 02/18/16.   Status Achieved     PT SHORT TERM GOAL #4   Title Pt will amb. 500' with LRAD at MOD I level, over even terrain, to improve functional mobility.    Baseline Met with rollator; 02/18/16   Status Achieved      PT SHORT TERM GOAL #5   Title Pt will improve BERG score to >/=47/56 to decr. falls risk   Baseline 45/56; 02/18/16.   Status Not Met           PT Long Term Goals - 02/04/16 1033      PT LONG TERM GOAL #1   Title Pt will amb. 1000' over even/uneven terrain with LRAD in order to improve functional mobility and go to  the store. TARGET DATE FOR ALL LTGS: 03/19/16   Status On-going     PT LONG TERM GOAL #2   Title Pt will be able to perform dynamic standing activities (weight shifting lat/post/ant and reaching forward 5") for 10 minutes in order to perform in band.    Status On-going     PT LONG TERM GOAL #3   Title Pt will verbalize understanding of fall prevention strategies to reduce falls risk.    Status On-going     PT LONG TERM GOAL #4   Title Pt will improve BERG score to >/=51/56 to decr. falls risk.    Status New               Plan - 03/10/16 1048    Clinical Impression Statement Pt is able to demonstrate safety with community ambulation over uneven surfaces with rollator.  Pt seems to be hesitant at times with correct technique with [facility] in household setting but was able to perform obstacle negotiations without LOB.   Rehab Potential Good   Clinical Impairments Affecting Rehab Potential HOH, impaired vision, CABG in 2000, MI in 1991, CAD, a-fib, DM, CVA 05/2015 per pt but chart states 05/2014, Hx of R fibula fracture, arthritis, HTN, hyperlipidemia, angina   PT Frequency 2x / week   PT Duration 8 weeks   PT Treatment/Interventions ADLs/Self Care Home Management;Biofeedback;Canalith Repostioning;Neuromuscular re-education;Balance training;Therapeutic exercise;Therapeutic activities;Manual techniques;Functional mobility training;Stair training;Gait training;DME Instruction;Orthotic Fit/Training;Patient/family education;Vestibular   PT Next Visit Plan Gait with SPC over uneven terrain, curb, and stairs. B LE strength training and balance training.    Consulted and Agree  with Plan of Care Patient      Patient will benefit from skilled therapeutic intervention in order to improve the following deficits and impairments:  Abnormal gait, Decreased endurance, Decreased knowledge of use of DME, Decreased strength, Pain, Impaired sensation, Decreased balance, Decreased mobility, Impaired vision/preception, Dizziness, Decreased cognition, Decreased coordination, Postural dysfunction  Visit Diagnosis: Other abnormalities of gait and mobility  Muscle weakness (generalized)     Problem List Patient Active Problem List   Diagnosis Date Noted  . CVA (cerebral infarction) 01-19-202016  . Cerebral embolism with cerebral infarction ([place]) 01-19-202016  . Fall   . Stroke ([place])   . UTI (lower urinary tract infection) 05/29/2014  . Fracture of distal end of right fibula 05/29/2014  . Rhabdomyolysis 05/29/2014  . Transaminitis 05/29/2014  . Weakness of both lower extremities 01/11/2014  . Pain of left lower extremity- and Right 01/11/2014  . Aftercare following surgery of the circulatory system, [place] 01/11/2014  . Peripheral vascular disease, unspecified 01/11/2014  . Cellulitis of great toe, right 08/23/2013  . CAD (coronary artery disease)   . Hypertensive heart disease without CHF   . Paroxysmal atrial fibrillation   . Diabetes mellitus type 2 with peripheral artery disease ([place])   . Obesity (BMI 30-39.9)   . Hyperlipidemia   . Atherosclerosis of native arteries of extremity with intermittent claudication ([place])   . History of GI bleed 04/21/2009  . S/P CABG (coronary artery bypass graft) 11/20/1998    Bjorn Loser, PTA  03/10/16, 10:55 AM [facility] [address] [place], [place], [address] Phone: [phone]   Fax:  [phone]  Name: Kristina Summers MRN: [id number] Date of Birth: 1935/08/06

## 2016-03-16 ENCOUNTER — Ambulatory Visit: Payer: Medicare Other | Admitting: Physical Therapy

## 2016-03-16 DIAGNOSIS — R2689 Other abnormalities of gait and mobility: Secondary | ICD-10-CM

## 2016-03-16 DIAGNOSIS — M6281 Muscle weakness (generalized): Secondary | ICD-10-CM

## 2016-03-16 NOTE — Progress Notes (Signed)
Established Intermittent Claudication  History of Present Illness  Kaylie Ritter is a 80 y.o. (May 27, 1935) female who presents with chief complaint: routine follow-up.  Prior procedures include:  1. L CIA PTA+S (04/01/11) 2. L SFA OA+PTA (06/17/11) 3. L distal SFA OA+PTA (06/17/11)  Since her prior visit, pt has fallen and been found down 05/29/14.  She was found to a small L MCA territory CVA and displaced R distal fib fx.  She also has become legally blind.  The patient's intermittent claudication remains resolved.  The patient's symptoms are: lower back pain and R ankle pain associated with fib fx and lifestyle limiting vertigo.  The patient denies any residual motor sx from her CVA.  She has some residual expressive aphasia.  Her visual loss has worsened.  The patient's treatment regimen currently included: maximal medical management.  Her ambulation is limited by her vision and some deconditioning.  The patient's PMH, PSH, and SH, and FamHx are unchanged from 11/20/15.  Current Outpatient Prescriptions  Medication Sig Dispense Refill  . acetaminophen (TYLENOL) 325 MG tablet Take 2 tablets (650 mg total) by mouth every 6 (six) hours as needed for mild pain (or Fever >/= 101). 60 tablet 1  . amLODipine (NORVASC) 5 MG tablet Take 1 tablet by mouth daily.    Marland Kitchen apixaban (ELIQUIS) 5 MG TABS tablet Take 1 tablet (5 mg total) by mouth 2 (two) times daily. 60 tablet 0  . atorvastatin (LIPITOR) 80 MG tablet Take 1 tablet (80 mg total) by mouth daily. 30 tablet 0  . furosemide (LASIX) 40 MG tablet Take 1 tablet (40 mg total) by mouth daily. (Patient taking differently: Take 40 mg by mouth 2 (two) times daily. ) 30 tablet   . HYDROcodone-acetaminophen (NORCO/VICODIN) 5-325 MG tablet Take 1 tablet by mouth every 4 (four) hours as needed. (Patient not taking: Reported on 03/09/2016) 10 tablet 0  . insulin NPH Human (HUMULIN N,NOVOLIN N) 100 UNIT/ML injection Inject 0.38 mLs (38 Units total) into  the skin 2 (two) times daily. 38 in am and 34 at night (Patient taking differently: Inject 37 Units into the skin daily before breakfast. 37 in am and 32 at night) 10 mL 11  . losartan (COZAAR) 50 MG tablet Take 1 tablet (50 mg total) by mouth daily. (Patient taking differently: Take 50 mg by mouth 2 (two) times daily. )    . metoprolol tartrate (LOPRESSOR) 25 MG tablet Take 1 tablet by mouth 2 (two) times daily.    . Multiple Vitamins-Minerals (ONE-A-DAY 50 PLUS PO) Take 1 tablet by mouth daily.    Marland Kitchen NITROSTAT 0.4 MG SL tablet Place 1 tablet under the tongue every 5 (five) minutes x 3 doses as needed. Chest pain  1  . predniSONE (STERAPRED UNI-PAK 21 TAB) 10 MG (21) TBPK tablet Take 1 tablet (10 mg total) by mouth daily. Take 6 tabs by mouth daily  for 2 days, then 5 tabs for 2 days, then 4 tabs for 2 days, then 3 tabs for 2 days, 2 tabs for 2 days, then 1 tab by mouth daily for 2 days 42 tablet 0   No current facility-administered medications for this visit.     On ROS today: no rest pain, no intermittent claudication    Physical Examination  Vitals:   03/19/16 0937  BP: (!) 166/65  Pulse: 72  Resp: 16  Temp: 97.2 F (36.2 C)  SpO2: 99%  Weight: 205 lb (93 kg)  Height: 5\' 6"  (1.676  m)    Body mass index is 33.09 kg/m.  General: A&O x 3, WD, mildly obese,   Pulmonary: Sym exp, good air movt, CTAB, no rales, rhonchi, & wheezing  Cardiac: RRR, Nl S1, S2, no Murmurs, rubs or gallops  Vascular: Vessel Right Left  Radial Palpable Palpable  Brachial Palpable Palpable  Carotid Palpable, without bruit Palpable, without bruit  Aorta Not palpable N/A  Femoral Palpable Palpable  Popliteal Not palpable Not palpable  PT Palpable Palpable  DP Not Palpable Not Palpable   Gastrointestinal: soft, NTND, no G/R, no HSM, no masses, no CVAT B  Musculoskeletal: M/S 5/5 throughout , Extremities without ischemic changes, BLE LDS, mod sized varicosities BLE  Neurologic: Pain and  light touch intact in extremities , Motor exam as listed above   Non-Invasive Vascular Imaging  ABI (Date: 03/19/2016)  R:   ABI: 1.18 (1.13),   DP: bi  PT: tri  TBI:  1.02  L:   ABI: 1.04 (0.88),   DP: tri  PT: tri  TBI: 0.90  Aortoiliac duplex (03/19/2016)  Ao: 78-114 c/s  R CIA: bi, 129-185 c/s  Stent biphasic in R CIA  L CIA: 126-150,  L EIA: 86-148 c/s  LLE arterial duplex (03/19/2016)  Bi-triphasic throughout  Mid-SFA: 214 c/s (<3 ratio)   Meddical Decision Making  Shaterria Sager is a 80 y.o. (March 31, 1936) female who presents with: h/o bilateral leg intermittent claudication without evidence of critical limb ischemia, BLE CVI, s/p R CIA PTA+S, L SFA OA+PTA, CVA   Pt had an excellent result from her prior orbital atherectomy and angioplasty.  Would continue with q6 month BLE ABI, aortoiliac duplex and LLE arterial duplex.  If the mid-SFA lesion worsens, would repeat OA L SFA with now DCB PTA.  I discussed in depth with the patient the nature of atherosclerosis, and emphasized the importance of maximal medical management including strict control of blood pressure, blood glucose, and lipid levels, antiplatelet agents, obtaining regular exercise, and cessation of smoking.    The patient is aware that without maximal medical management the underlying atherosclerotic disease process will progress, limiting the benefit of any interventions.  The patient is currently on a statin: Lipitor.  The patient is currently not on an anti-platelet: as on anticoagulation Eliquis.  Thank you for allowing [place] to participate in this patient's care.   Adele Barthel, MD, FACS [facility] Office: [phone] Pager: [phone]

## 2016-03-16 NOTE — Therapy (Signed)
[facility] [address] [place], [place], [address] Phone: [phone]   Fax:  [phone]  Physical Therapy Treatment  Patient Details  Name: Kristina Summers MRN: [id number] Date of Birth: May 02, 1935 Referring Provider: [name]  Encounter Date: 03/16/2016      PT End of Session - 03/16/16 1006    Visit Number 10   Date for PT Re-Evaluation 03/23/16   Authorization Type G-CODE EVERY 10TH VISIT.   PT Start Time [phone]   PT Stop Time 1010   PT Time Calculation (min) 41 min   Equipment Utilized During Treatment Gait belt   Activity Tolerance Patient tolerated treatment well   Behavior During Therapy WFL for tasks assessed/performed      Past Medical History:  Diagnosis Date  . Angina   . Arthritis   . Atrial fibrillation ([place])   . CAD (coronary artery disease)   . Carotid artery occlusion   . Diabetes mellitus   . Fibromyalgia   . GERD (gastroesophageal reflux disease)   . GI bleed 1/11  . Hyperlipidemia   . Hypertension   . Myocardial infarction ~ 1991  . Normal nuclear stress test 03/18/2011  . Obesity   . Peripheral vascular disease ([place])   . Stroke [facility])    May 24, 2014  . UTI (lower urinary tract infection) Sept. 2015    Past Surgical History:  Procedure Laterality Date  . ABDOMINAL AORTAGRAM N/A 04/01/2011   Procedure: ABDOMINAL AORTAGRAM;  Surgeon: Conrad White [place], MD;  Location: [facility] CATH LAB;  Service: Cardiovascular;  Laterality: N/A;  . ANGIOPLASTY  06/17/11   Left leg common femoral artery cannulation under u/s Left leg runoff  . CATARACT EXTRACTION W/ INTRAOCULAR LENS  IMPLANT, BILATERAL  2004-2005  . CORONARY ARTERY BYPASS GRAFT  2000   CABG X5  . EYE SURGERY    . LOWER EXTREMITY ANGIOGRAM Bilateral 04/01/2011   Procedure: LOWER EXTREMITY ANGIOGRAM;  Surgeon: Conrad Worthington, MD;  Location: [facility] CATH LAB;  Service: Cardiovascular;  Laterality: Bilateral;  . LOWER EXTREMITY ANGIOGRAM Left  06/17/2011   Procedure: LOWER EXTREMITY ANGIOGRAM;  Surgeon: Conrad Newtown, MD;  Location: [facility] CATH LAB;  Service: Cardiovascular;  Laterality: Left;  . LOWER EXTREMITY ANGIOGRAM N/A 11/18/2011   Procedure: LOWER EXTREMITY ANGIOGRAM;  Surgeon: Conrad Berlin, MD;  Location: [facility] CATH LAB;  Service: Cardiovascular;  Laterality: N/A;  . PERCUTANEOUS STENT INTERVENTION Right 04/01/2011   Procedure: PERCUTANEOUS STENT INTERVENTION;  Surgeon: Conrad Grand Tower, MD;  Location: [facility] CATH LAB;  Service: Cardiovascular;  Laterality: Right;  rt iliac stent  . PERIPHERAL ARTERIAL STENT GRAFT  04/01/11   right common iliac  . TRIGGER FINGER RELEASE  1996   right thumb    There were no vitals filed for this visit.      Subjective Assessment - 03/16/16 0931    Subjective thinks she might have a UTI, reports life has been hectic.  c/o lightheadedness and LBP over weekend - no trouble today.   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 No/denies            [facility] PT Assessment - 03/16/16 0935  Ambulation/Gait   Gait velocity 3.17 ft/sec  10.35 sec with cane     Standardized Balance Assessment   Standardized Balance Assessment Berg Balance Test     Timed Up and Go Test   TUG Normal TUG   Normal TUG (seconds) 11.87  cane         Reviewed HEP given 10/18 an 10/19 and pt independent with exercises, only needing cues due to vision impairment and inability to see exercises on screen.  Progressed corner balance feet together and partial tandem exercises to compliant surface.            [facility] PT Treatment/Exercise - 03/16/16 0935      Berg Balance Test   Sit to Stand Able to stand  without using hands and stabilize independently   Standing Unsupported Able to stand safely 2 minutes   Sitting with Back Unsupported but Feet Supported on Floor or Stool Able to sit safely and securely 2 minutes   Stand to Sit Sits safely with minimal use of hands   Transfers Able to transfer safely, minor use of hands   Standing Unsupported with Eyes Closed Able to stand 10 seconds safely   Standing Ubsupported with Feet Together Able to place feet together independently and stand 1 minute safely   From Standing, Reach Forward with Outstretched Arm Can reach forward >12 cm safely (5")   From Standing Position, Pick up Object from Floor Able to pick up shoe safely and easily   From Standing Position, Turn to Look Behind Over each Shoulder Looks behind from both sides and weight shifts well   Turn 360 Degrees Able to turn 360 degrees safely one side only in 4 seconds or less   Standing Unsupported, Alternately Place Feet on Step/Stool Able to complete 4 steps without aid or supervision   Standing Unsupported, One Foot in Front Able to plae foot ahead of the other independently and hold 30 seconds   Standing on One Leg Able to lift leg independently and hold equal to or more than 3 seconds   Total Score 49                  PT Short Term Goals - 03/16/16 1007      PT SHORT TERM GOAL #1   Title Pt will be IND in HEP to improve strength and balance. TARGET DATE FOR ALL STGS: 02/20/16   Status Achieved     PT SHORT TERM GOAL #2   Title Perform BERG and write goals prn.   Status Achieved     PT SHORT TERM GOAL #3   Title Pt will perform TUG with no AD in</=13.5sec. to decr. falls risk.   Status Achieved     PT SHORT TERM GOAL #4   Title Pt will amb. 500' with LRAD at MOD I level, over even terrain, to improve functional mobility.    Status Achieved     PT SHORT TERM GOAL #5   Title Pt will improve BERG score to >/=47/56 to decr. falls risk   Status Achieved            PT Long Term Goals - 03/16/16 1007      PT LONG TERM GOAL #1   Title Pt will amb. 1000' over even/uneven terrain with LRAD in order to improve functional mobility and go to the store. TARGET DATE FOR ALL LTGS: 03/19/16   Status On-going     PT LONG TERM GOAL #2  Title Pt will be able to perform dynamic standing activities (weight shifting lat/post/ant and reaching forward 5") for 10 minutes in order to perform in band.    Status On-going     PT LONG TERM GOAL #3   Title Pt will verbalize understanding of fall prevention strategies to reduce falls risk.    Status On-going     PT LONG TERM GOAL #4   Title Pt will improve BERG score to >/=51/56 to decr. falls risk.    Status Not Met               Plan - 03/27/16 1009    Clinical Impression Statement Pt's BERG score improved from 43/56 to 49/56 but not quite meeting LTG.  Independent with HEP and progressed corner balance exercises to compliant surface.  Anticipate d/c next visit.   PT Treatment/Interventions ADLs/Self Care Home Management;Biofeedback;Canalith Repostioning;Neuromuscular re-education;Balance training;Therapeutic exercise;Therapeutic activities;Manual techniques;Functional mobility training;Stair training;Gait training;DME Instruction;Orthotic Fit/Training;Patient/family education;Vestibular   PT Next Visit Plan check remaining goals, d/c.      Patient will benefit from skilled therapeutic intervention in order to improve the following deficits and impairments:  Abnormal gait, Decreased endurance, Decreased knowledge of use of DME, Decreased strength, Pain, Impaired sensation, Decreased balance, Decreased mobility, Impaired vision/preception, Dizziness, Decreased cognition, Decreased coordination, Postural dysfunction  Visit Diagnosis: Other abnormalities of gait and mobility  Muscle weakness (generalized)       G-Codes - 27-Mar-2016 1011    Functional Assessment Tool Used TUG: 11.68 sec.; gait speed:  3.24f/sec.   Functional Limitation Mobility: Walking and moving around   Mobility: Walking and Moving Around Current Status [phone] At least 1 percent but less than 20 percent impaired, limited or restricted   Mobility: Walking and Moving Around Goal Status [phone] At least 1 percent but less than 20 percent impaired, limited or restricted      Problem List Patient Active Problem List   Diagnosis Date Noted  . CVA (cerebral infarction) 2020-03-2115  . Cerebral embolism with cerebral infarction ([place] 2020-03-2115  . Fall   . Stroke ([place]   . UTI (lower urinary tract infection) 05/29/2014  . Fracture of distal end of right fibula 05/29/2014  . Rhabdomyolysis 05/29/2014  . Transaminitis 05/29/2014  . Weakness of both lower extremities 01/11/2014  . Pain of left lower extremity- and Right 01/11/2014  . Aftercare following surgery of the circulatory system, [place]  . Peripheral vascular disease, unspecified 01/11/2014  . Cellulitis of great toe, right 08/23/2013  . CAD (coronary artery disease)   . Hypertensive heart disease without CHF   . Paroxysmal atrial fibrillation   . Diabetes mellitus type 2 with peripheral artery disease ([place]   . Obesity (BMI 30-39.9)   . Hyperlipidemia   . Atherosclerosis of native arteries of extremity with intermittent claudication ([place]   . History of GI bleed 04/21/2009  . S/P CABG (coronary artery bypass graft) 11/20/1998       SLaureen Abrahams PT, DPT [id number]-Dec-201710:12 AM    [address] StreetSSteeleGDuncanville [place] [address]Phone: [phone]  Fax:  [phone] Name: Kristina RoederMRN: [id number] of Birth: [birth year]

## 2016-03-17 ENCOUNTER — Encounter: Payer: Self-pay | Admitting: Vascular Surgery

## 2016-03-18 ENCOUNTER — Ambulatory Visit: Payer: Medicare Other

## 2016-03-18 DIAGNOSIS — M6281 Muscle weakness (generalized): Secondary | ICD-10-CM

## 2016-03-18 DIAGNOSIS — R2689 Other abnormalities of gait and mobility: Secondary | ICD-10-CM

## 2016-03-18 NOTE — Therapy (Signed)
[facility] [address] [place], [place], [address] Phone: [phone]   Fax:  [phone]  Physical Therapy Treatment  Patient Details  Name: Kristina Summers MRN: [id number] Date of Birth: 23-Aug-1935 Referring Provider: [name]  Encounter Date: 03/18/2016      PT End of Session - 03/18/16 1006    Visit Number 11   Number of Visits 17   Date for PT Re-Evaluation 03/23/16   Authorization Type G-CODE EVERY 10TH VISIT.   PT Start Time [phone]   PT Stop Time 0957  d/c   PT Time Calculation (min) 32 min   Equipment Utilized During Treatment --  S prn   Activity Tolerance Patient tolerated treatment well   Behavior During Therapy WFL for tasks assessed/performed      Past Medical History:  Diagnosis Date  . Angina   . Arthritis   . Atrial fibrillation ([place])   . CAD (coronary artery disease)   . Carotid artery occlusion   . Diabetes mellitus   . Fibromyalgia   . GERD (gastroesophageal reflux disease)   . GI bleed 1/11  . Hyperlipidemia   . Hypertension   . Myocardial infarction ~ 1991  . Normal nuclear stress test 03/18/2011  . Obesity   . Peripheral vascular disease ([place])   . Stroke [facility])    May 24, 2014  . UTI (lower urinary tract infection) Sept. 2015    Past Surgical History:  Procedure Laterality Date  . ABDOMINAL AORTAGRAM N/A 04/01/2011   Procedure: ABDOMINAL AORTAGRAM;  Surgeon: Conrad [place], MD;  Location: [facility] CATH LAB;  Service: Cardiovascular;  Laterality: N/A;  . ANGIOPLASTY  06/17/11   Left leg common femoral artery cannulation under u/s Left leg runoff  . CATARACT EXTRACTION W/ INTRAOCULAR LENS  IMPLANT, BILATERAL  2004-2005  . CORONARY ARTERY BYPASS GRAFT  2000   CABG X5  . EYE SURGERY    . LOWER EXTREMITY ANGIOGRAM Bilateral 04/01/2011   Procedure: LOWER EXTREMITY ANGIOGRAM;  Surgeon: Conrad Quincy, MD;  Location: [facility] CATH LAB;  Service: Cardiovascular;  Laterality: Bilateral;  . LOWER  EXTREMITY ANGIOGRAM Left 06/17/2011   Procedure: LOWER EXTREMITY ANGIOGRAM;  Surgeon: Conrad Finney, MD;  Location: [facility] CATH LAB;  Service: Cardiovascular;  Laterality: Left;  . LOWER EXTREMITY ANGIOGRAM N/A 11/18/2011   Procedure: LOWER EXTREMITY ANGIOGRAM;  Surgeon: Conrad Skillman, MD;  Location: [facility] CATH LAB;  Service: Cardiovascular;  Laterality: N/A;  . PERCUTANEOUS STENT INTERVENTION Right 04/01/2011   Procedure: PERCUTANEOUS STENT INTERVENTION;  Surgeon: Conrad Hood, MD;  Location: [facility] CATH LAB;  Service: Cardiovascular;  Laterality: Right;  rt iliac stent  . PERIPHERAL ARTERIAL STENT GRAFT  04/01/11   right common iliac  . TRIGGER FINGER RELEASE  1996   right thumb    There were no vitals filed for this visit.      Subjective Assessment - 03/18/16 0929    Subjective Pt arrived late again today, due to transportation late ([facility]). Pt still thinks she has a UTI, and reports she will call the doctor. Pt plans to [facility] at [facility].   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 No/denies                         [facility] Adult PT Treatment/Exercise - 03/18/16 0932      Ambulation/Gait   Ambulation/Gait Yes   Ambulation/Gait Assistance 6: Modified independent (Device/Increase time)   Ambulation/Gait Assistance Details No LOB over even/uneven terrain.   Ambulation Distance (Feet) 1000 Feet   Assistive device Rollator   Gait Pattern Step-through pattern;Within Functional Limits   Ambulation Surface Indoor;Level;Unlevel;Outdoor;Paved     Balance   Balance Assessed Yes     Dynamic Standing Balance   Dynamic Standing - Balance Support No upper  extremity supported   Dynamic Standing - Level of Assistance 5: Stand by assistance   Dynamic Standing - Balance Activities Reaching for objects;Reaching for weighted objects;Reaching across midline   Dynamic Standing - Comments Pt performed reaching at least 5" outside BOS 4x5 reps/UE for 10 minutes without LOB, S to ensure safety.           Self Care:     PT Education - 03/18/16 1006    Education provided Yes   Education Details PT discussed goals and d/c, pt agreeable. PT reiterated the importance of using AD during amb. and continuing HEP.  Pt able to verbalize fall prevention strategies.   Person(s) Educated Patient   Methods Explanation   Comprehension Verbalized understanding          PT Short Term Goals - 03/16/16 1007      PT SHORT TERM GOAL #1   Title Pt will be IND in HEP to improve strength and balance. TARGET DATE FOR ALL STGS: 02/20/16   Status Achieved     PT SHORT TERM GOAL #2   Title Perform BERG and write goals prn.   Status Achieved     PT SHORT TERM GOAL #3   Title Pt will perform TUG with no AD in</=13.5sec. to decr. falls risk.   Status Achieved     PT SHORT TERM GOAL #4   Title Pt will amb. 500' with LRAD at MOD I level, over even terrain, to improve functional mobility.    Status Achieved     PT SHORT TERM GOAL #5   Title Pt will improve BERG score to >/=47/56 to decr. falls risk   Status Achieved           PT Long Term Goals - 03/18/16 1007      PT LONG TERM GOAL #1   Title Pt will amb. 1000' over even/uneven terrain with LRAD in order to improve functional mobility and go to the store. TARGET DATE FOR ALL LTGS: 03/19/16   Status Achieved     PT LONG TERM GOAL #2   Title Pt will be able to perform dynamic standing activities (weight shifting lat/post/ant and reaching forward 5") for 10 minutes in order to perform in band.    Status Achieved     PT LONG TERM GOAL #3   Title Pt will verbalize understanding of fall prevention  strategies to reduce falls risk.    Status Achieved     PT LONG TERM GOAL #4   Title Pt will improve BERG score to >/=51/56 to decr. falls risk.    Status Not Met               Plan - 03/18/16 1007    Clinical Impression Statement Pt met LTGs 1, 2, and 3. Please see pt d/c summary for  details.    PT Treatment/Interventions ADLs/Self Care Home Management;Biofeedback;Canalith Repostioning;Neuromuscular re-education;Balance training;Therapeutic exercise;Therapeutic activities;Manual techniques;Functional mobility training;Stair training;Gait training;DME Instruction;Orthotic Fit/Training;Patient/family education;Vestibular   PT Next Visit Plan d/c      Patient will benefit from skilled therapeutic intervention in order to improve the following deficits and impairments:  Abnormal gait, Decreased endurance, Decreased knowledge of use of DME, Decreased strength, Pain, Impaired sensation, Decreased balance, Decreased mobility, Impaired vision/preception, Dizziness, Decreased cognition, Decreased coordination, Postural dysfunction  Visit Diagnosis: Other abnormalities of gait and mobility  Muscle weakness (generalized)       G-Codes - 03/22/16 1007    Functional Assessment Tool Used TUG: 11.68 sec.; gait speed: 3.30ft/sec.   Functional Limitation Mobility: Walking and moving around   Mobility: Walking and Moving Around Goal Status [phone]) At least 1 percent but less than 20 percent impaired, limited or restricted   Mobility: Walking and Moving Around Discharge Status [phone]) At least 1 percent but less than 20 percent impaired, limited or restricted      Problem List Patient Active Problem List   Diagnosis Date Noted  . CVA (cerebral infarction) 23-Sep-202016  . Cerebral embolism with cerebral infarction (HCC) 23-Sep-202016  . Fall   . Stroke (HCC)   . UTI (lower urinary tract infection) 05/29/2014  . Fracture of distal end of right fibula 05/29/2014  . Rhabdomyolysis 05/29/2014   . Transaminitis 05/29/2014  . Weakness of both lower extremities 01/11/2014  . Pain of left lower extremity- and Right 01/11/2014  . Aftercare following surgery of the circulatory system, NEC 01/11/2014  . Peripheral vascular disease, unspecified 01/11/2014  . Cellulitis of great toe, right 08/23/2013  . CAD (coronary artery disease)   . Hypertensive heart disease without CHF   . Paroxysmal atrial fibrillation   . Diabetes mellitus type 2 with peripheral artery disease (HCC)   . Obesity (BMI 30-39.9)   . Hyperlipidemia   . Atherosclerosis of native arteries of extremity with intermittent claudication (HCC)   . History of GI bleed 04/21/2009  . S/P CABG (coronary artery bypass graft) 11/20/1998    Louana Fontenot L 2016/03/22, 10:09 AM  [facility] [address] [place], [place], [address] Phone: [phone]   Fax:  [phone]  Name: Kristina Summers MRN: [id number] Date of Birth: 01-10-1936  PHYSICAL THERAPY DISCHARGE SUMMARY  Visits from Start of Care: 11  Current functional level related to goals / functional outcomes:     PT Long Term Goals - March 22, 2016 1007      PT LONG TERM GOAL #1   Title Pt will amb. 1000' over even/uneven terrain with LRAD in order to improve functional mobility and go to the store. TARGET DATE FOR ALL LTGS: 03/19/16   Status Achieved     PT LONG TERM GOAL #2   Title Pt will be able to perform dynamic standing activities (weight shifting lat/post/ant and reaching forward 5") for 10 minutes in order to perform in band.    Status Achieved     PT LONG TERM GOAL #3   Title Pt will verbalize understanding of fall prevention strategies to reduce falls risk.    Status Achieved     PT LONG TERM GOAL #4   Title Pt will improve BERG score to >/=51/56 to decr. falls risk.    Status Not Met        Remaining deficits: Incr. Postural sway during amb. Without AD.   Education / Equipment: HEP  and pt verbalized plans to join balance  classes at [facility].   Plan: Patient agrees to discharge.  Patient goals were met. Patient is being discharged due to meeting the stated rehab goals.  ?????        Geoffry Paradise, PT,DPT 03/18/16 10:10 AM Phone: [phone] Fax: [phone]

## 2016-03-19 ENCOUNTER — Encounter: Payer: Self-pay | Admitting: Vascular Surgery

## 2016-03-19 ENCOUNTER — Ambulatory Visit (HOSPITAL_COMMUNITY)
Admission: RE | Admit: 2016-03-19 | Discharge: 2016-03-19 | Disposition: A | Discharge status: Home / Self Care | Payer: Medicare Other | Source: Ambulatory Visit | Attending: Vascular Surgery | Admitting: Vascular Surgery

## 2016-03-19 ENCOUNTER — Ambulatory Visit (INDEPENDENT_AMBULATORY_CARE_PROVIDER_SITE_OTHER): Payer: Medicare Other | Admitting: Vascular Surgery

## 2016-03-19 ENCOUNTER — Ambulatory Visit (INDEPENDENT_AMBULATORY_CARE_PROVIDER_SITE_OTHER)
Admission: RE | Admit: 2016-03-19 | Discharge: 2016-03-19 | Disposition: A | Discharge status: Home / Self Care | Payer: Medicare Other | Source: Ambulatory Visit | Attending: Vascular Surgery | Admitting: Vascular Surgery

## 2016-03-19 VITALS — BP 166/65 | HR 72 | Temp 97.2°F | Resp 16 | Ht 66.0 in | Wt 205.0 lb

## 2016-03-19 DIAGNOSIS — Z48812 Encounter for surgical aftercare following surgery on the circulatory system: Secondary | ICD-10-CM

## 2016-03-19 DIAGNOSIS — E1151 Type 2 diabetes mellitus with diabetic peripheral angiopathy without gangrene: Secondary | ICD-10-CM | POA: Insufficient documentation

## 2016-03-19 DIAGNOSIS — E785 Hyperlipidemia, unspecified: Secondary | ICD-10-CM | POA: Insufficient documentation

## 2016-03-19 DIAGNOSIS — I10 Essential (primary) hypertension: Secondary | ICD-10-CM | POA: Insufficient documentation

## 2016-03-19 DIAGNOSIS — I70213 Atherosclerosis of native arteries of extremities with intermittent claudication, bilateral legs: Secondary | ICD-10-CM

## 2016-03-19 DIAGNOSIS — R0989 Other specified symptoms and signs involving the circulatory and respiratory systems: Secondary | ICD-10-CM | POA: Diagnosis present

## 2016-03-19 NOTE — Addendum Note (Signed)
Addended by: Lianne Cure A on: 03/19/2016 02:47 PM   Modules accepted: Orders

## 2016-05-11 ENCOUNTER — Ambulatory Visit: Payer: Medicare Other | Admitting: Sports Medicine

## 2016-07-13 ENCOUNTER — Ambulatory Visit (INDEPENDENT_AMBULATORY_CARE_PROVIDER_SITE_OTHER): Payer: Medicare Other | Admitting: Adult Health

## 2016-07-13 ENCOUNTER — Encounter: Payer: Self-pay | Admitting: Adult Health

## 2016-07-13 VITALS — BP 148/61 | HR 70 | Ht 66.0 in | Wt 210.2 lb

## 2016-07-13 DIAGNOSIS — Z8673 Personal history of transient ischemic attack (TIA), and cerebral infarction without residual deficits: Secondary | ICD-10-CM

## 2016-07-13 NOTE — Patient Instructions (Signed)
Continue Eliquis Keep Blood Pressure <130/90 Cholesterol LDL <70 hgbA1c <6.5 % If you have any stroke like symptoms call 911 If your symptoms worsen or you develop new symptoms please let us know.

## 2016-07-13 NOTE — Progress Notes (Signed)
PATIENT: Kristina Summers DOB: 1936-01-04  REASON FOR VISIT: follow up- stroke HISTORY FROM: patient  HISTORY OF PRESENT ILLNESS: Kristina Summers is a 81 year old female with a history of stroke. She returns today for follow-up. The patient remains on eliquis  and is tolerating it well. She states that her blood pressure and cholesterol is managed by her cardiologist. She states that her blood pressure has been in normal range. She is unsure what her last hemoglobin A1c was however she thinks it was around 8. She is on insulin. She states that after her stroke she has had difficulty speaking. She states while she was a [facility] she did participate in speech therapy. She is now at home. She is able to complete most ADLs independently. She uses a Rollator when ambulating. Denies any additional strokelike symptoms. She returns today for an evaluation.   Interval history 01/12/2016: Her eye sight is not good and she is getting some headache on top of her head. Her left eye is a little foggy. Started months ago. She has seen Dr. Radene Ou about this. Dr. Radene Ou is a retina specialist. No headache today. No other pain, no jaw pain, no shoulder pain, no fevers, no chewing difficulty. Headaches are dull on the top of her head. The headache resolves on its own. Will check a crp and esr today. Headache just in the last few months. She is taking the Eliquis every day no problems at all. She feels like her speech is slowly getting better. She had speech therapy. Her balance is off. No new weakness or other symptoms.   HER:DEYC Kristina Summers a 81 y.o.femalehere as a follow up. She was admitted 6 weeks ago for difficulty speaking. Kristina Summers is a 81 y.o. female with a history of afib s/p ablation not on [facility] procedures who presented following a fall with ankle fracture to the ED. She is a care facility. The morning she was brought to the ED, she was "spacey" when the nurse spoke with her and seemed to have some trouble  finding her words, but was able to respond. When her daughter saw her around 8am, she noticed she was off, but thought it was pain medication. She subsequently saw her again around 9:45 am and patient was still having difficulty speaking. MRI of the head revealed an acute, small left MCA infarct.  At [facility] hospital, NIHSS: 6(2 for questions, 1 for gaze preference, 2 for aphasia, 1 for dysarthria). Patient was not administered TPA secondary to delay in arrival. She was admitted for further evaluation and treatment.   She is feeling better. Her speech is improving, here with daughter and almost back to her baseline. Daughter agrees. She says she went to the emergency room for her ankle and while there they noticed aphasia. That is what led to the admission.  Reviewed notes, labs and imaging from outside physicians, which showed:  Mr Brain Wo Contrast 05/30/2014 MRI HEAD:  Acute small LEFT middle cerebral artery territory infarct (LEFT frontal lobe).Otherwise normal MRI of the brain for age. MRA HEAD: Moderately motion degraded examination without central large vessel occlusion. Distal thromboembolic disease would not be appreciated on this motion degraded examination.    Carotid Doppler There is 1-39% bilateral ICA stenosis. Vertebral artery flow is antegrade.    REVIEW OF SYSTEMS: Out of a complete 14 system review of symptoms, the patient complains only of the following symptoms, and all other reviewed systems are negative.  See history of present illness  ALLERGIES: Allergies  Allergen Reactions  . Morphine And Related Nausea And Vomiting    Chest pain    . Lisinopril Cough  . Sertraline Hcl     REACTION: nausea  . Zoloft [Sertraline Hcl] Other (See Comments)    Nausea     HOME MEDICATIONS: Outpatient Medications Prior to Visit  Medication Sig Dispense Refill  . acetaminophen (TYLENOL) 325 MG tablet Take 2 tablets (650 mg total) by mouth every 6 (six)  hours as needed for mild pain (or Fever >/= 101). 60 tablet 1  . amLODipine (NORVASC) 5 MG tablet Take 1 tablet by mouth daily.    Marland Kitchen apixaban (ELIQUIS) 5 MG TABS tablet Take 1 tablet (5 mg total) by mouth 2 (two) times daily. 60 tablet 0  . atorvastatin (LIPITOR) 80 MG tablet Take 1 tablet (80 mg total) by mouth daily. 30 tablet 0  . furosemide (LASIX) 40 MG tablet Take 1 tablet (40 mg total) by mouth daily. (Patient taking differently: Take 40 mg by mouth 2 (two) times daily. ) 30 tablet   . HYDROcodone-acetaminophen (NORCO/VICODIN) 5-325 MG tablet Take 1 tablet by mouth every 4 (four) hours as needed. (Patient not taking: Reported on 03/19/2016) 10 tablet 0  . insulin NPH Human (HUMULIN N,NOVOLIN N) 100 UNIT/ML injection Inject 0.38 mLs (38 Units total) into the skin 2 (two) times daily. 38 in am and 34 at night (Patient taking differently: Inject 37 Units into the skin daily before breakfast. 37 in am and 32 at night) 10 mL 11  . losartan (COZAAR) 50 MG tablet Take 1 tablet (50 mg total) by mouth daily. (Patient taking differently: Take 50 mg by mouth 2 (two) times daily. )    . metoprolol tartrate (LOPRESSOR) 25 MG tablet Take 1 tablet by mouth 2 (two) times daily.    . Multiple Vitamins-Minerals (ONE-A-DAY 50 PLUS PO) Take 1 tablet by mouth daily.    Marland Kitchen NITROSTAT 0.4 MG SL tablet Place 1 tablet under the tongue every 5 (five) minutes x 3 doses as needed. Chest pain  1  . predniSONE (DELTASONE) 10 MG tablet     . predniSONE (STERAPRED UNI-PAK 21 TAB) 10 MG (21) TBPK tablet Take 1 tablet (10 mg total) by mouth daily. Take 6 tabs by mouth daily  for 2 days, then 5 tabs for 2 days, then 4 tabs for 2 days, then 3 tabs for 2 days, 2 tabs for 2 days, then 1 tab by mouth daily for 2 days 42 tablet 0   No facility-administered medications prior to visit.     PAST MEDICAL HISTORY: Past Medical History:  Diagnosis Date  . Angina   . Arthritis   . Atrial fibrillation ([place])   . CAD (coronary artery  disease)   . Carotid artery occlusion   . Diabetes mellitus   . Fibromyalgia   . GERD (gastroesophageal reflux disease)   . GI bleed 1/11  . Hyperlipidemia   . Hypertension   . Myocardial infarction ~ 1991  . Normal nuclear stress test 03/18/2011  . Obesity   . Peripheral vascular disease ([place])   . Stroke [facility])    May 24, 2014  . UTI (lower urinary tract infection) Sept. 2015    PAST SURGICAL HISTORY: Past Surgical History:  Procedure Laterality Date  . ABDOMINAL AORTAGRAM N/A 04/01/2011   Procedure: ABDOMINAL AORTAGRAM;  Surgeon: Conrad Blackwood, MD;  Location: [facility] CATH LAB;  Service: Cardiovascular;  Laterality: N/A;  . ANGIOPLASTY  06/17/11   Left leg common  femoral artery cannulation under u/s Left leg runoff  . CATARACT EXTRACTION W/ INTRAOCULAR LENS  IMPLANT, BILATERAL  2004-2005  . CORONARY ARTERY BYPASS GRAFT  2000   CABG X5  . EYE SURGERY    . LOWER EXTREMITY ANGIOGRAM Bilateral 04/01/2011   Procedure: LOWER EXTREMITY ANGIOGRAM;  Surgeon: Conrad Campbell, MD;  Location: [facility] CATH LAB;  Service: Cardiovascular;  Laterality: Bilateral;  . LOWER EXTREMITY ANGIOGRAM Left 06/17/2011   Procedure: LOWER EXTREMITY ANGIOGRAM;  Surgeon: Conrad Harvey, MD;  Location: [facility] CATH LAB;  Service: Cardiovascular;  Laterality: Left;  . LOWER EXTREMITY ANGIOGRAM N/A 11/18/2011   Procedure: LOWER EXTREMITY ANGIOGRAM;  Surgeon: Conrad Spillville, MD;  Location: [facility] CATH LAB;  Service: Cardiovascular;  Laterality: N/A;  . PERCUTANEOUS STENT INTERVENTION Right 04/01/2011   Procedure: PERCUTANEOUS STENT INTERVENTION;  Surgeon: Conrad Lorraine, MD;  Location: [facility] CATH LAB;  Service: Cardiovascular;  Laterality: Right;  rt iliac stent  . PERIPHERAL ARTERIAL STENT GRAFT  04/01/11   right common iliac  . TRIGGER FINGER RELEASE  1996   right thumb    FAMILY HISTORY: Family History  Problem Relation Age of Onset  . Other Brother     intestinal blockage  . Diabetes Brother     SOCIAL HISTORY: Social History     Social History  . Marital status: Divorced    Spouse name: N/A  . Number of children: 3  . Years of education: 12   Occupational History  . Retired    Social History Main Topics  . Smoking status: Former Smoker    Packs/day: 2.00    Years: 30.00    Types: Cigarettes    Quit date: 04/19/1990  . Smokeless tobacco: Never Used     Comment: stopped smoking cigarettes 1991  . Alcohol use No  . Drug use: No  . Sexual activity: No   Other Topics Concern  . Not on file   Social History Narrative   Divorced.  Native of [place].  Formerly worked as Education administrator person.   Caffeine use: drinks decaf tea and coffee         PHYSICAL EXAM  Vitals:   07/13/16 0855  BP: (!) 148/61  Pulse: 70  Weight: 210 lb 3.2 oz (95.3 kg)  Height: 5' 6" (1.676 m)   Body mass index is 33.93 kg/m.  Generalized: Well developed, in no acute distress   Neurological examination  Mentation: Alert oriented to time, place, history taking. Follows all commands speech and language fluent Cranial nerve II-XII: Pupils were equal round reactive to light. Extraocular movements were full, visual field were full on confrontational test. Facial sensation and strength were normal. Uvula tongue midline. Head turning and shoulder shrug  were normal and symmetric. Motor: The motor testing reveals 5 over 5 strength of all 4 extremities. Good symmetric motor tone is noted throughout.  Sensory: Sensory testing is intact to soft touch on all 4 extremities. No evidence of extinction is noted.  Coordination: Cerebellar testing reveals good finger-nose-finger and heel-to-shin bilaterally.  Gait and station: Patient uses a Rollator when ambulating. Reflexes: Deep tendon reflexes are symmetric and normal bilaterally.   DIAGNOSTIC DATA (LABS, IMAGING, TESTING) - I reviewed patient records, labs, notes, testing and imaging myself where available.  Lab Results  Component Value Date   WBC 10.7 (H) 06/03/2014   HGB 12.6  06/03/2014   HCT 37.4 06/03/2014   MCV 93.0 06/03/2014   PLT 361 06/03/2014      Component Value  Date/Time   NA 136 06/03/2014 0551   K 4.1 06/03/2014 0551   CL 105 06/03/2014 0551   CO2 26 06/03/2014 0551   GLUCOSE 91 06/03/2014 0551   BUN 10 06/03/2014 0551   CREATININE 0.76 06/03/2014 0551   CALCIUM 8.6 06/03/2014 0551   PROT 6.2 05/30/2014 0254   ALBUMIN 2.3 (L) 05/30/2014 0254   AST 104 (H) 05/30/2014 0254   ALT 64 (H) 05/30/2014 0254   ALKPHOS 106 05/30/2014 0254   BILITOT 0.9 05/30/2014 0254   GFRNONAA 80 (L) 06/03/2014 0551   GFRAA >90 06/03/2014 0551   Lab Results  Component Value Date   CHOL 92 April 24, 202016   HDL 11 (L) April 24, 202016   LDLCALC 48 April 24, 202016   TRIG 165 (H) April 24, 202016   CHOLHDL 8.4 April 24, 202016   Lab Results  Component Value Date   HGBA1C 10.4 (H) 05/29/2014   No results found for: [id number] Lab Results  Component Value Date   TSH 1.469 05/29/2014      ASSESSMENT AND PLAN 81 y.o. year old female  has a past medical history of Angina; Arthritis; Atrial fibrillation ([place]); CAD (coronary artery disease); Carotid artery occlusion; Diabetes mellitus; Fibromyalgia; GERD (gastroesophageal reflux disease); GI bleed (1/11); Hyperlipidemia; Hypertension; Myocardial infarction (~ 1991); Normal nuclear stress test (03/18/2011); Obesity; Peripheral vascular disease ([place]); Stroke [facility]); and UTI (lower urinary tract infection) (Sept. 2015). here with:  1. History of stroke  Overall the patient has remained stable. She will continue on Eliquis. She should maintain strict control of her blood pressure with goal less than 130/90. Cholesterol LDL less than 70 and hemoglobin A1c less than 6.5%. Advised that she should monitor her diet in order to control her diabetes. She voiced understanding. Advised that if she has any strokelike symptoms she should go to the emergency room. She will follow-up in 6 months or sooner if needed.  I spent 15 minutes with the patient  50% of this time was spent counseling the patient on stroke risk factors and stroke symptoms.     Ward Givens, MSN, NP-C 07/13/2016, 8:59 AM [facility] [address], [place], [place] [address] [phone]

## 2016-08-03 ENCOUNTER — Ambulatory Visit: Payer: Medicare Other | Admitting: Sports Medicine

## 2016-08-16 ENCOUNTER — Encounter: Payer: Self-pay | Admitting: Cardiology

## 2016-08-17 ENCOUNTER — Ambulatory Visit (INDEPENDENT_AMBULATORY_CARE_PROVIDER_SITE_OTHER): Payer: Medicare Other | Admitting: Sports Medicine

## 2016-08-17 ENCOUNTER — Encounter: Payer: Self-pay | Admitting: Sports Medicine

## 2016-08-17 DIAGNOSIS — Q828 Other specified congenital malformations of skin: Secondary | ICD-10-CM

## 2016-08-17 DIAGNOSIS — E1151 Type 2 diabetes mellitus with diabetic peripheral angiopathy without gangrene: Secondary | ICD-10-CM

## 2016-08-17 DIAGNOSIS — B351 Tinea unguium: Secondary | ICD-10-CM

## 2016-08-17 DIAGNOSIS — E0849 Diabetes mellitus due to underlying condition with other diabetic neurological complication: Secondary | ICD-10-CM

## 2016-08-17 DIAGNOSIS — M79676 Pain in unspecified toe(s): Secondary | ICD-10-CM | POA: Diagnosis not present

## 2016-08-17 NOTE — Progress Notes (Signed)
Patient ID: Kristina Summers, female   DOB: 1935/11/12, 81 y.o.   MRN: [id number] Subjective: Kristina Summers is a 81 y.o. female patient with history of diabetes who presents to office today complaining of long, painful nails and callus  while ambulating in shoes; unable to trim. Patient states that the glucose reading this morning was "good", 108. Patient denies any new changes in medication or new problems. Patient denies any new cramping, numbness, burning or tingling in the legs.  + history of stroke, 1 year ago and has vascular follow up with Dr. Bridgett Larsson.  Patient Active Problem List   Diagnosis Date Noted  . CVA (cerebral infarction) 08/07/202016  . Cerebral embolism with cerebral infarction ([place]) 08/07/202016  . Fall   . Stroke ([place])   . UTI (lower urinary tract infection) 05/29/2014  . Fracture of distal end of right fibula 05/29/2014  . Rhabdomyolysis 05/29/2014  . Transaminitis 05/29/2014  . Weakness of both lower extremities 01/11/2014  . Pain of left lower extremity- and Right 01/11/2014  . Aftercare following surgery of the circulatory system, [place] 01/11/2014  . Peripheral vascular disease, unspecified ([place]) 01/11/2014  . Cellulitis of great toe, right 08/23/2013  . CAD (coronary artery disease)   . Hypertensive heart disease without CHF   . Paroxysmal atrial fibrillation   . Diabetes mellitus type 2 with peripheral artery disease ([place])   . Obesity (BMI 30-39.9)   . Hyperlipidemia   . Atherosclerosis of native arteries of extremity with intermittent claudication ([place])   . History of GI bleed 04/21/2009  . S/P CABG (coronary artery bypass graft) 11/20/1998   Current Outpatient Prescriptions on File Prior to Visit  Medication Sig Dispense Refill  . acetaminophen (TYLENOL) 325 MG tablet Take 2 tablets (650 mg total) by mouth every 6 (six) hours as needed for mild pain (or Fever >/= 101). 60 tablet 1  . amLODipine (NORVASC) 5 MG tablet Take 1 tablet by mouth daily.    Marland Kitchen apixaban (ELIQUIS) 5  MG TABS tablet Take 1 tablet (5 mg total) by mouth 2 (two) times daily. 60 tablet 0  . atorvastatin (LIPITOR) 80 MG tablet Take 1 tablet (80 mg total) by mouth daily. 30 tablet 0  . furosemide (LASIX) 40 MG tablet Take 1 tablet (40 mg total) by mouth daily. (Patient taking differently: Take 40 mg by mouth 2 (two) times daily. ) 30 tablet   . insulin NPH Human (HUMULIN N,NOVOLIN N) 100 UNIT/ML injection Inject 0.38 mLs (38 Units total) into the skin 2 (two) times daily. 38 in am and 34 at night (Patient taking differently: Inject 37 Units into the skin daily before breakfast. 37 in am and 30 at night,  Takes Humulin R 5Units am and pm) 10 mL 11  . losartan (COZAAR) 50 MG tablet Take 1 tablet (50 mg total) by mouth daily. (Patient taking differently: Take 50 mg by mouth 2 (two) times daily. )    . metoprolol tartrate (LOPRESSOR) 25 MG tablet Take 1 tablet by mouth 2 (two) times daily.    . Multiple Vitamins-Minerals (ONE-A-DAY 50 PLUS PO) Take 1 tablet by mouth daily.    Marland Kitchen NITROSTAT 0.4 MG SL tablet Place 1 tablet under the tongue every 5 (five) minutes x 3 doses as needed. Chest pain  1   No current facility-administered medications on file prior to visit.    Allergies  Allergen Reactions  . Morphine And Related Nausea And Vomiting    Chest pain    . Lisinopril Cough  .  Sertraline Hcl     REACTION: nausea  . Zoloft [Sertraline Hcl] Other (See Comments)    Nausea     No results found for this or any previous visit (from the past 2160 hour(s)).  Objective: General: Patient is awake, alert, and oriented x 3 and in no acute distress.  Integument: Skin is warm, dry and supple bilateral. Nails are tender, long, thickened and  dystrophic with subungual debris, consistent with onychomycosis, 1-5 bilateral. No signs of infection. Mild callus sub met 5 bilateral without infection. Remaining integument unremarkable.  Vasculature:  Dorsalis Pedis pulse 0/4 bilateral. Posterior Tibial pulse  0/4  bilateral. No ischemia or gangrene. Capillary fill time <5 sec 1-5 bilateral. No hair growth to the level of the digits. Temperature gradient within normal limits. No varicosities present bilateral. No edema present bilateral.   Neurology: The patient has diminished sensation measured with a 5.07/10g Semmes Weinstein Monofilament at all pedal sites bilateral. Vibratory sensation absent bilateral with tuning fork. No Babinski sign present bilateral.   Musculoskeletal: Asymptomatic hammertoe and pes planus pedal deformities noted bilateral. Muscular strength 4/5 in all lower extremity muscular groups bilateral without pain on range of motion . No tenderness with calf compression bilateral.  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 -Discussed and educated patient on diabetic foot care, especially with regards to the vascular, neurological and musculoskeletal systems.  -Stressed the importance of good glycemic control and the detriment of not controlling glucose levels in relation to the foot. -Mechanically debrided all nails 1-5 bilateral using sterile nail nipper and filed with dremel without incident  -Callus minimal reduced with sterile chisel blade bilateral without incident  -Continue with vascular follow up as scheduled -[employer] diabetic shoe order form was completed; office to contact primary care for approval / certification;  Office to arrange shoe fitting and dispensing. -Answered all patient questions -Patient to return  in 3 months for at risk foot care -Patient advised to call the office if any problems or questions arise in the meantime.  Landis Martins, DPM

## 2016-08-26 NOTE — Progress Notes (Signed)
Personally  participated in, made any corrections needed, and agree with history, physical, neuro exam,assessment and plan as stated above.    Jaeliana Lococo, MD [facility] 

## 2016-09-20 NOTE — Progress Notes (Deleted)
Established Intermittent Claudication   History of Present Illness  Kristina Summers is a 81 y.o. (1935/07/26) female   who presents with chief complaint: ***. Prior procedures include:  1. L CIA PTA+S (04/01/11) 2. L SFA OA+PTA (06/17/11) 3. L distal SFA OA+PTA (06/17/11)  ***The patient's intermittent claudication remains resolved.  The patient's symptoms are: ***.  ***The patient denies any residual motor sx from her CVA.  She has some residual expressive aphasia.  Her visual loss has worsened. The patient's treatment regimen currently included: maximal medical management. Her ambulation is limited by her vision and some deconditioning.  The patient's PMH, PSH, and SH, and FamHx are unchanged from 03/22/16.  Current Outpatient Prescriptions  Medication Sig Dispense Refill  . acetaminophen (TYLENOL) 325 MG tablet Take 2 tablets (650 mg total) by mouth every 6 (six) hours as needed for mild pain (or Fever >/= 101). 60 tablet 1  . amLODipine (NORVASC) 5 MG tablet Take 1 tablet by mouth daily.    Marland Kitchen apixaban (ELIQUIS) 5 MG TABS tablet Take 1 tablet (5 mg total) by mouth 2 (two) times daily. 60 tablet 0  . atorvastatin (LIPITOR) 80 MG tablet Take 1 tablet (80 mg total) by mouth daily. 30 tablet 0  . furosemide (LASIX) 40 MG tablet Take 1 tablet (40 mg total) by mouth daily. (Patient taking differently: Take 40 mg by mouth 2 (two) times daily. ) 30 tablet   . insulin NPH Human (HUMULIN N,NOVOLIN N) 100 UNIT/ML injection Inject 0.38 mLs (38 Units total) into the skin 2 (two) times daily. 38 in am and 34 at night (Patient taking differently: Inject 37 Units into the skin daily before breakfast. 37 in am and 30 at night,  Takes Humulin R 5Units am and pm) 10 mL 11  . losartan (COZAAR) 50 MG tablet Take 1 tablet (50 mg total) by mouth daily. (Patient taking differently: Take 50 mg by mouth 2 (two) times daily. )    . metoprolol tartrate (LOPRESSOR) 25 MG tablet Take 1 tablet by mouth 2 (two)  times daily.    . Multiple Vitamins-Minerals (ONE-A-DAY 50 PLUS PO) Take 1 tablet by mouth daily.    Marland Kitchen NITROSTAT 0.4 MG SL tablet Place 1 tablet under the tongue every 5 (five) minutes x 3 doses as needed. Chest pain  1   No current facility-administered medications for this visit.    On ROS today: ***, ***   Physical Examination  There were no vitals filed for this visit.  There is no height or weight on file to calculate BMI.  General: A&O x 3, WD, mildly obese,   Pulmonary: Sym exp, good air movt, CTAB, no rales, rhonchi, & wheezing  Cardiac: RRR, Nl S1, S2, no Murmurs, rubs or gallops  Vascular: Vessel Right Left  Radial Palpable Palpable  Brachial Palpable Palpable  Carotid Palpable, without bruit Palpable, without bruit  Aorta Not palpable N/A  Femoral Palpable Palpable  Popliteal Not palpable Not palpable  PT Palpable Palpable  DP Not Palpable Not Palpable   Gastrointestinal: soft, NTND, no G/R, no HSM, no masses, no CVAT B  Musculoskeletal: M/S 5/5 throughout , Extremities without ischemic changes, BLE LDS, mod sized varicosities BLE  Neurologic: Pain and light touch intact in extremities , Motor exam as listed above   Non-Invasive Vascular Imaging  ABI (***)  R:   ABI: *** (1/18),   PT: {Signals:19197::"none","mono","bi","tri"}  DP: {Signals:19197::"none","mono","bi","tri"}  TBI:  ***  L:   ABI: *** (1.04),  PT: {Signals:19197::"none","mono","bi","tri"}  DP: {Signals:19197::"none","mono","bi","tri"}  TBI: ***  Aortoiliac duplex (***)  ***Ao: 78-114 c/s  R CIA: bi, 129-185 c/s  Stent biphasic in R CIA  L CIA: 126-150,  L EIA: 86-148 c/s  LLE arterial duplex (***)  ***Bi-triphasic throughout  Mid-SFA: 214 c/s (<3 ratio)   Meddical Decision Making  Kristina Summers is a 81 y.o. (Feb 06, 1936) female  who presents with: h/o bilateralleg intermittent claudication without evidence of critical limb ischemia, BLE CVI, s/p  R CIA PTA+S, L SFA OA+PTA, CVA   Pt had an excellent result from her prior orbital atherectomy and angioplasty.  Would continue with q6 month BLE ABI, aortoiliac duplex and LLE arterial duplex.  If the mid-SFA lesion worsens, would repeat OA L SFA with now DCB PTA.  I discussed in depth with the patient the nature of atherosclerosis, and emphasized the importance of maximal medical management including strict control of blood pressure, blood glucose, and lipid levels, antiplatelet agents, obtaining regular exercise, and cessation of smoking.   The patient is aware that without maximal medical management the underlying atherosclerotic disease process will progress, limiting the benefit of any interventions.  The patient is currently on a statin: Lipitor.  The patient is currently not on an anti-platelet: as on anticoagulation Eliquis.  Thank you for allowing [place] to participate in this patient's care.   Adele Barthel, MD, FACS [facility] Office: [phone] Pager: [phone]

## 2016-09-21 ENCOUNTER — Ambulatory Visit: Payer: Medicare Other | Admitting: Orthotics

## 2016-09-21 ENCOUNTER — Encounter: Payer: Self-pay | Admitting: Vascular Surgery

## 2016-09-24 ENCOUNTER — Ambulatory Visit: Payer: Medicare Other | Admitting: Vascular Surgery

## 2016-09-24 ENCOUNTER — Encounter (HOSPITAL_COMMUNITY): Payer: Medicare Other

## 2016-09-28 ENCOUNTER — Ambulatory Visit (INDEPENDENT_AMBULATORY_CARE_PROVIDER_SITE_OTHER): Payer: Medicare Other | Admitting: Orthotics

## 2016-09-28 DIAGNOSIS — M2041 Other hammer toe(s) (acquired), right foot: Secondary | ICD-10-CM | POA: Diagnosis not present

## 2016-09-28 DIAGNOSIS — L84 Corns and callosities: Secondary | ICD-10-CM | POA: Diagnosis not present

## 2016-09-28 DIAGNOSIS — E1142 Type 2 diabetes mellitus with diabetic polyneuropathy: Secondary | ICD-10-CM

## 2016-09-28 DIAGNOSIS — M2042 Other hammer toe(s) (acquired), left foot: Secondary | ICD-10-CM | POA: Diagnosis not present

## 2016-09-28 DIAGNOSIS — E0849 Diabetes mellitus due to underlying condition with other diabetic neurological complication: Secondary | ICD-10-CM

## 2016-09-28 DIAGNOSIS — E1151 Type 2 diabetes mellitus with diabetic peripheral angiopathy without gangrene: Secondary | ICD-10-CM

## 2016-10-05 NOTE — Progress Notes (Signed)
Patient came in today to pick up diabetic shoes and custom inserts.  Same was well pleased with fit and function.   The foot ortheses offered full contact with plantar surface and contoured the arch well.   The shoes fit well with no heel slippage and areas of pressure concern.   Patient advised to contact us if any problems arise.

## 2016-11-16 ENCOUNTER — Ambulatory Visit: Payer: Medicare Other | Admitting: Sports Medicine

## 2016-12-07 ENCOUNTER — Encounter: Payer: Self-pay | Admitting: Sports Medicine

## 2016-12-07 ENCOUNTER — Ambulatory Visit (INDEPENDENT_AMBULATORY_CARE_PROVIDER_SITE_OTHER): Payer: Medicare Other | Admitting: Sports Medicine

## 2016-12-07 DIAGNOSIS — E1151 Type 2 diabetes mellitus with diabetic peripheral angiopathy without gangrene: Secondary | ICD-10-CM | POA: Diagnosis not present

## 2016-12-07 DIAGNOSIS — Q828 Other specified congenital malformations of skin: Secondary | ICD-10-CM | POA: Diagnosis not present

## 2016-12-07 DIAGNOSIS — E0849 Diabetes mellitus due to underlying condition with other diabetic neurological complication: Secondary | ICD-10-CM

## 2016-12-07 DIAGNOSIS — B351 Tinea unguium: Secondary | ICD-10-CM

## 2016-12-07 DIAGNOSIS — M79676 Pain in unspecified toe(s): Secondary | ICD-10-CM

## 2016-12-07 NOTE — Progress Notes (Signed)
Patient ID: Kristina Summers, female   DOB: 09-25-1935, 81 y.o.   MRN: [id number] Subjective: Kristina Summers is a 81 y.o. female patient with history of diabetes who presents to office today complaining of long, painful nails and callus  while ambulating in shoes; unable to trim. Patient states that the glucose reading this morning was "high" has appt with doctor 01/01/17. Patient denies any new changes in medication or new problems.   Patient Active Problem List   Diagnosis Date Noted  . CVA (cerebral infarction) Mar 28, 202016  . Cerebral embolism with cerebral infarction ([place]) Mar 28, 202016  . Fall   . Stroke ([place])   . UTI (lower urinary tract infection) 05/29/2014  . Fracture of distal end of right fibula 05/29/2014  . Rhabdomyolysis 05/29/2014  . Transaminitis 05/29/2014  . Weakness of both lower extremities 01/11/2014  . Pain of left lower extremity- and Right 01/11/2014  . Aftercare following surgery of the circulatory system, [place] 01/11/2014  . Peripheral vascular disease, unspecified ([place]) 01/11/2014  . Cellulitis of great toe, right 08/23/2013  . CAD (coronary artery disease)   . Hypertensive heart disease without CHF   . Paroxysmal atrial fibrillation   . Diabetes mellitus type 2 with peripheral artery disease ([place])   . Obesity (BMI 30-39.9)   . Hyperlipidemia   . Atherosclerosis of native arteries of extremity with intermittent claudication ([place])   . History of GI bleed 04/21/2009  . S/P CABG (coronary artery bypass graft) 11/20/1998   Current Outpatient Prescriptions on File Prior to Visit  Medication Sig Dispense Refill  . acetaminophen (TYLENOL) 325 MG tablet Take 2 tablets (650 mg total) by mouth every 6 (six) hours as needed for mild pain (or Fever >/= 101). 60 tablet 1  . amLODipine (NORVASC) 5 MG tablet Take 1 tablet by mouth daily.    Marland Kitchen apixaban (ELIQUIS) 5 MG TABS tablet Take 1 tablet (5 mg total) by mouth 2 (two) times daily. 60 tablet 0  . atorvastatin (LIPITOR) 80 MG tablet Take  1 tablet (80 mg total) by mouth daily. 30 tablet 0  . furosemide (LASIX) 40 MG tablet Take 1 tablet (40 mg total) by mouth daily. (Patient taking differently: Take 40 mg by mouth 2 (two) times daily. ) 30 tablet   . insulin NPH Human (HUMULIN N,NOVOLIN N) 100 UNIT/ML injection Inject 0.38 mLs (38 Units total) into the skin 2 (two) times daily. 38 in am and 34 at night (Patient taking differently: Inject 37 Units into the skin daily before breakfast. 37 in am and 30 at night,  Takes Humulin R 5Units am and pm) 10 mL 11  . losartan (COZAAR) 50 MG tablet Take 1 tablet (50 mg total) by mouth daily. (Patient taking differently: Take 50 mg by mouth 2 (two) times daily. )    . metoprolol tartrate (LOPRESSOR) 25 MG tablet Take 1 tablet by mouth 2 (two) times daily.    . Multiple Vitamins-Minerals (ONE-A-DAY 50 PLUS PO) Take 1 tablet by mouth daily.    Marland Kitchen NITROSTAT 0.4 MG SL tablet Place 1 tablet under the tongue every 5 (five) minutes x 3 doses as needed. Chest pain  1   No current facility-administered medications on file prior to visit.    Allergies  Allergen Reactions  . Morphine And Related Nausea And Vomiting    Chest pain    . Lisinopril Cough  . Sertraline Hcl     REACTION: nausea  . Zoloft [Sertraline Hcl] Other (See Comments)    Nausea  No results found for this or any previous visit (from the past 2160 hour(s)).  Objective: General: Patient is awake, alert, and oriented x 3 and in no acute distress.  Integument: Skin is warm, dry and supple bilateral. Nails are tender, long, thickened and  dystrophic with subungual debris, consistent with onychomycosis, 1-5 bilateral. No signs of infection. Mild callus sub met 5 bilateral without infection. Remaining integument unremarkable.  Vasculature:  Dorsalis Pedis pulse 0/4 bilateral. Posterior Tibial pulse  0/4 bilateral. No ischemia or gangrene. Capillary fill time <5 sec 1-5 bilateral. No hair growth to the level of the  digits. Temperature gradient within normal limits. No varicosities present bilateral. No edema present bilateral.   Neurology: The patient has diminished sensation measured with a 5.07/10g Semmes Weinstein Monofilament at all pedal sites bilateral. Vibratory sensation absent bilateral with tuning fork. No Babinski sign present bilateral.   Musculoskeletal: Asymptomatic hammertoe and pes planus pedal deformities noted bilateral. Muscular strength 4/5 in all lower extremity muscular groups bilateral without pain on range of motion . No tenderness with calf compression bilateral.  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 -Discussed and educated patient on diabetic foot care, especially with regards to the vascular, neurological and musculoskeletal systems.  -Stressed the importance of good glycemic control and the detriment of not controlling glucose levels in relation to the foot. -Mechanically debrided all nails 1-5 bilateral using sterile nail nipper and filed with dremel without incident  -Callus x2 minimal reduced with sterile chisel blade bilateral without incident  -Continue with diabetic shoes  -Answered all patient questions -Patient to return  in 3 months for at risk foot care -Patient advised to call the office if any problems or questions arise in the meantime.  Landis Martins, DPM

## 2017-01-13 ENCOUNTER — Encounter: Payer: Self-pay | Admitting: Adult Health

## 2017-01-13 ENCOUNTER — Ambulatory Visit (INDEPENDENT_AMBULATORY_CARE_PROVIDER_SITE_OTHER): Payer: Medicare Other | Admitting: Adult Health

## 2017-01-13 VITALS — BP 184/73 | HR 65 | Wt 214.0 lb

## 2017-01-13 DIAGNOSIS — Z8673 Personal history of transient ischemic attack (TIA), and cerebral infarction without residual deficits: Secondary | ICD-10-CM | POA: Diagnosis not present
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
## 2017-01-13 NOTE — Patient Instructions (Addendum)
Your Plan:  Continue eliquis  Monitor Blood pressure- keep <130/90- elevated today. Keep a check on this- if it continues to run high then please make your PCP aware Cholesterol LDL <70 HgbA1c <6.5 % If your symptoms worsen or you develop new symptoms please let us know.  If you have strokelike symptoms please call [phone]  Thank you for coming to see [place] at [facility]. I hope we have been able to provide you high quality care today.  You may receive a patient satisfaction survey over the next few weeks. We would appreciate your feedback and comments so that we may continue to improve ourselves and the health of our patients.

## 2017-01-17 DIAGNOSIS — I5030 Unspecified diastolic (congestive) heart failure: Secondary | ICD-10-CM

## 2017-01-17 HISTORY — DX: Unspecified diastolic (congestive) heart failure: I50.30

## 2017-01-28 ENCOUNTER — Inpatient Hospital Stay (HOSPITAL_COMMUNITY): Payer: Medicare Other

## 2017-01-28 ENCOUNTER — Inpatient Hospital Stay (HOSPITAL_COMMUNITY)
Admission: EM | Admit: 2017-01-28 | Discharge: 2017-01-30 | DRG: 293 | Disposition: A | Discharge status: Home / Self Care | Payer: Medicare Other | Attending: Family Medicine | Admitting: Family Medicine

## 2017-01-28 ENCOUNTER — Encounter: Payer: Self-pay | Admitting: Cardiology

## 2017-01-28 ENCOUNTER — Encounter (HOSPITAL_COMMUNITY): Payer: Self-pay | Admitting: Emergency Medicine

## 2017-01-28 ENCOUNTER — Emergency Department (HOSPITAL_COMMUNITY): Payer: Medicare Other

## 2017-01-28 DIAGNOSIS — Z833 Family history of diabetes mellitus: Secondary | ICD-10-CM | POA: Diagnosis not present

## 2017-01-28 DIAGNOSIS — Z79899 Other long term (current) drug therapy: Secondary | ICD-10-CM

## 2017-01-28 DIAGNOSIS — I2583 Coronary atherosclerosis due to lipid rich plaque: Secondary | ICD-10-CM | POA: Diagnosis not present

## 2017-01-28 DIAGNOSIS — I340 Nonrheumatic mitral (valve) insufficiency: Secondary | ICD-10-CM

## 2017-01-28 DIAGNOSIS — I5033 Acute on chronic diastolic (congestive) heart failure: Secondary | ICD-10-CM | POA: Diagnosis present

## 2017-01-28 DIAGNOSIS — I480 Paroxysmal atrial fibrillation: Secondary | ICD-10-CM | POA: Diagnosis present

## 2017-01-28 DIAGNOSIS — E1151 Type 2 diabetes mellitus with diabetic peripheral angiopathy without gangrene: Secondary | ICD-10-CM | POA: Diagnosis present

## 2017-01-28 DIAGNOSIS — I4891 Unspecified atrial fibrillation: Secondary | ICD-10-CM | POA: Diagnosis not present

## 2017-01-28 DIAGNOSIS — I350 Nonrheumatic aortic (valve) stenosis: Secondary | ICD-10-CM

## 2017-01-28 DIAGNOSIS — E1142 Type 2 diabetes mellitus with diabetic polyneuropathy: Secondary | ICD-10-CM | POA: Diagnosis present

## 2017-01-28 DIAGNOSIS — Z951 Presence of aortocoronary bypass graft: Secondary | ICD-10-CM

## 2017-01-28 DIAGNOSIS — I639 Cerebral infarction, unspecified: Secondary | ICD-10-CM | POA: Diagnosis present

## 2017-01-28 DIAGNOSIS — I69320 Aphasia following cerebral infarction: Secondary | ICD-10-CM

## 2017-01-28 DIAGNOSIS — Z794 Long term (current) use of insulin: Secondary | ICD-10-CM

## 2017-01-28 DIAGNOSIS — I110 Hypertensive heart disease with heart failure: Secondary | ICD-10-CM | POA: Diagnosis present

## 2017-01-28 DIAGNOSIS — R001 Bradycardia, unspecified: Secondary | ICD-10-CM | POA: Diagnosis present

## 2017-01-28 DIAGNOSIS — J810 Acute pulmonary edema: Secondary | ICD-10-CM | POA: Diagnosis present

## 2017-01-28 DIAGNOSIS — Z87891 Personal history of nicotine dependence: Secondary | ICD-10-CM

## 2017-01-28 DIAGNOSIS — E11319 Type 2 diabetes mellitus with unspecified diabetic retinopathy without macular edema: Secondary | ICD-10-CM | POA: Diagnosis present

## 2017-01-28 DIAGNOSIS — I252 Old myocardial infarction: Secondary | ICD-10-CM | POA: Diagnosis not present

## 2017-01-28 DIAGNOSIS — Z7901 Long term (current) use of anticoagulants: Secondary | ICD-10-CM | POA: Diagnosis not present

## 2017-01-28 DIAGNOSIS — I2510 Atherosclerotic heart disease of native coronary artery without angina pectoris: Secondary | ICD-10-CM | POA: Diagnosis present

## 2017-01-28 DIAGNOSIS — I160 Hypertensive urgency: Secondary | ICD-10-CM | POA: Diagnosis present

## 2017-01-28 DIAGNOSIS — I509 Heart failure, unspecified: Secondary | ICD-10-CM

## 2017-01-28 DIAGNOSIS — I482 Chronic atrial fibrillation, unspecified: Secondary | ICD-10-CM | POA: Diagnosis present

## 2017-01-28 HISTORY — DX: Unspecified diastolic (congestive) heart failure: I50.30

## 2017-01-28 LAB — BASIC METABOLIC PANEL
ANION GAP: 6 (ref 5–15)
BUN: 26 mg/dL — ABNORMAL HIGH (ref 6–20)
CALCIUM: 9.2 mg/dL (ref 8.9–10.3)
CHLORIDE: 108 mmol/L (ref 101–111)
CO2: 24 mmol/L (ref 22–32)
Creatinine, Ser: 0.97 mg/dL (ref 0.44–1.00)
GFR calc non Af Amer: 54 mL/min — ABNORMAL LOW (ref >60)
Glucose, Bld: 113 mg/dL — ABNORMAL HIGH (ref 65–99)
Potassium: 4.3 mmol/L (ref 3.5–5.1)
SODIUM: 138 mmol/L (ref 135–145)

## 2017-01-28 LAB — ECHOCARDIOGRAM COMPLETE
HEIGHTINCHES: 66 in
WEIGHTICAEL: 3352 [oz_av]

## 2017-01-28 LAB — CBC WITH DIFFERENTIAL/PLATELET
BASOS ABS: 0.1 10*3/uL (ref 0.0–0.1)
BASOS PCT: 1 %
EOS ABS: 0.3 10*3/uL (ref 0.0–0.7)
EOS PCT: 3 %
HCT: 42 % (ref 36.0–46.0)
Hemoglobin: 13.9 g/dL (ref 12.0–15.0)
LYMPHS ABS: 0.9 10*3/uL (ref 0.7–4.0)
Lymphocytes Relative: 10 %
MCH: 32 pg (ref 26.0–34.0)
MCHC: 33.1 g/dL (ref 30.0–36.0)
MCV: 96.6 fL (ref 78.0–100.0)
MONO ABS: 0.7 10*3/uL (ref 0.1–1.0)
Monocytes Relative: 9 %
NEUTROS ABS: 6.7 10*3/uL (ref 1.7–7.7)
NEUTROS PCT: 77 %
PLATELETS: 236 10*3/uL (ref 150–400)
RBC: 4.35 MIL/uL (ref 3.87–5.11)
RDW: 13.4 % (ref 11.5–15.5)
WBC: 8.6 10*3/uL (ref 4.0–10.5)

## 2017-01-28 LAB — BRAIN NATRIURETIC PEPTIDE: B NATRIURETIC PEPTIDE 5: 686 pg/mL — AB (ref 0.0–100.0)

## 2017-01-28 LAB — GLUCOSE, CAPILLARY
GLUCOSE-CAPILLARY: 195 mg/dL — AB (ref 65–99)
Glucose-Capillary: 313 mg/dL — ABNORMAL HIGH (ref 65–99)

## 2017-01-28 LAB — TROPONIN I
TROPONIN I: 0.04 ng/mL — AB (ref <0.03)
Troponin I: <0.03 ng/mL (ref <0.03)
Troponin I: <0.03 ng/mL (ref <0.03)

## 2017-01-28 LAB — CBG MONITORING, ED: Glucose-Capillary: 163 mg/dL — ABNORMAL HIGH (ref 65–99)

## 2017-01-28 MED ORDER — NITROGLYCERIN 2 % TD OINT
1.0000 [in_us] | TOPICAL_OINTMENT | Freq: Once | TRANSDERMAL | Status: AC
  Administered 2017-01-28: 1 [in_us] via TOPICAL
  Filled 2017-01-28: qty 1

## 2017-01-28 MED ORDER — FUROSEMIDE 10 MG/ML IJ SOLN: 40.0000 mg | Freq: Once | INTRAMUSCULAR | Status: DC

## 2017-01-28 MED ORDER — FUROSEMIDE 10 MG/ML IJ SOLN
40.0000 mg | Freq: Two times a day (BID) | INTRAMUSCULAR | Status: DC
  Administered 2017-01-28 – 2017-01-29 (×3): 40 mg via INTRAVENOUS
  Filled 2017-01-28 (×3): qty 4

## 2017-01-28 MED ORDER — ACETAMINOPHEN 325 MG PO TABS
650.0000 mg | ORAL_TABLET | Freq: Four times a day (QID) | ORAL | Status: DC | PRN
  Administered 2017-01-29 (×2): 650 mg via ORAL
  Filled 2017-01-28 (×2): qty 2

## 2017-01-28 MED ORDER — ONDANSETRON HCL 4 MG/2ML IJ SOLN: 4.0000 mg | Freq: Four times a day (QID) | INTRAMUSCULAR | Status: DC | PRN

## 2017-01-28 MED ORDER — HEPARIN SODIUM (PORCINE) 5000 UNIT/ML IJ SOLN: 5000.0000 [IU] | Freq: Three times a day (TID) | INTRAMUSCULAR | Status: DC

## 2017-01-28 MED ORDER — AMLODIPINE BESYLATE 10 MG PO TABS
10.0000 mg | ORAL_TABLET | Freq: Every day | ORAL | Status: DC
  Administered 2017-01-28 – 2017-01-30 (×3): 10 mg via ORAL
  Filled 2017-01-28 (×3): qty 1

## 2017-01-28 MED ORDER — INSULIN NPH (HUMAN) (ISOPHANE) 100 UNIT/ML ~~LOC~~ SUSP
30.0000 [IU] | Freq: Two times a day (BID) | SUBCUTANEOUS | Status: DC
  Administered 2017-01-28 – 2017-01-30 (×4): 30 [IU] via SUBCUTANEOUS
  Filled 2017-01-28: qty 10

## 2017-01-28 MED ORDER — ATORVASTATIN CALCIUM 80 MG PO TABS
80.0000 mg | ORAL_TABLET | Freq: Every day | ORAL | Status: DC
  Administered 2017-01-28 – 2017-01-29 (×2): 80 mg via ORAL
  Filled 2017-01-28 (×2): qty 1

## 2017-01-28 MED ORDER — NITROGLYCERIN 2 % TD OINT
1.0000 [in_us] | TOPICAL_OINTMENT | Freq: Three times a day (TID) | TRANSDERMAL | Status: DC
  Administered 2017-01-28: 1 [in_us] via TOPICAL
  Filled 2017-01-28: qty 30

## 2017-01-28 MED ORDER — HYDRALAZINE HCL 20 MG/ML IJ SOLN
10.0000 mg | Freq: Four times a day (QID) | INTRAMUSCULAR | Status: DC | PRN
  Administered 2017-01-30: 10 mg via INTRAVENOUS
  Filled 2017-01-28: qty 1

## 2017-01-28 MED ORDER — SENNA 8.6 MG PO TABS
1.0000 | ORAL_TABLET | Freq: Two times a day (BID) | ORAL | Status: DC
  Administered 2017-01-28 – 2017-01-30 (×4): 8.6 mg via ORAL
  Filled 2017-01-28 (×5): qty 1

## 2017-01-28 MED ORDER — SODIUM CHLORIDE 0.9 % IV SOLN: 250.0000 mL | INTRAVENOUS | Status: DC | PRN

## 2017-01-28 MED ORDER — METOPROLOL TARTRATE 25 MG PO TABS
25.0000 mg | ORAL_TABLET | Freq: Two times a day (BID) | ORAL | Status: DC
  Administered 2017-01-28 – 2017-01-29 (×2): 25 mg via ORAL
  Filled 2017-01-28 (×3): qty 1

## 2017-01-28 MED ORDER — POLYETHYLENE GLYCOL 3350 17 G PO PACK: 17.0000 g | PACK | Freq: Every day | ORAL | Status: DC | PRN

## 2017-01-28 MED ORDER — FUROSEMIDE 10 MG/ML IJ SOLN
60.0000 mg | Freq: Once | INTRAMUSCULAR | Status: AC
  Administered 2017-01-28: 60 mg via INTRAVENOUS
  Filled 2017-01-28: qty 6

## 2017-01-28 MED ORDER — TRAZODONE HCL 50 MG PO TABS: 50.0000 mg | ORAL_TABLET | Freq: Every evening | ORAL | Status: DC | PRN

## 2017-01-28 MED ORDER — SODIUM CHLORIDE 0.9% FLUSH
3.0000 mL | Freq: Two times a day (BID) | INTRAVENOUS | Status: DC
Start: 2017-01-28 — End: 2017-01-30
  Administered 2017-01-28 – 2017-01-30 (×5): 3 mL via INTRAVENOUS

## 2017-01-28 MED ORDER — SODIUM CHLORIDE 0.9% FLUSH: 3.0000 mL | INTRAVENOUS | Status: DC | PRN

## 2017-01-28 MED ORDER — INSULIN ASPART 100 UNIT/ML ~~LOC~~ SOLN: 0.0000 [IU] | Freq: Every day | SUBCUTANEOUS | Status: DC

## 2017-01-28 MED ORDER — ACETAMINOPHEN 325 MG PO TABS: 650.0000 mg | ORAL_TABLET | Freq: Four times a day (QID) | ORAL | Status: DC | PRN

## 2017-01-28 MED ORDER — INSULIN ASPART 100 UNIT/ML ~~LOC~~ SOLN
3.0000 [IU] | Freq: Three times a day (TID) | SUBCUTANEOUS | Status: DC
  Administered 2017-01-28 – 2017-01-30 (×6): 3 [IU] via SUBCUTANEOUS

## 2017-01-28 MED ORDER — APIXABAN 5 MG PO TABS
5.0000 mg | ORAL_TABLET | Freq: Two times a day (BID) | ORAL | Status: DC
  Administered 2017-01-28 – 2017-01-30 (×4): 5 mg via ORAL
  Filled 2017-01-28 (×6): qty 1

## 2017-01-28 MED ORDER — LOSARTAN POTASSIUM 50 MG PO TABS
50.0000 mg | ORAL_TABLET | Freq: Every day | ORAL | Status: DC
  Administered 2017-01-28 – 2017-01-29 (×2): 50 mg via ORAL
  Filled 2017-01-28 (×3): qty 1

## 2017-01-28 MED ORDER — ONDANSETRON HCL 4 MG PO TABS: 4.0000 mg | ORAL_TABLET | Freq: Four times a day (QID) | ORAL | Status: DC | PRN

## 2017-01-28 MED ORDER — INSULIN ASPART 100 UNIT/ML ~~LOC~~ SOLN
0.0000 [IU] | Freq: Three times a day (TID) | SUBCUTANEOUS | Status: DC
  Administered 2017-01-28: 11 [IU] via SUBCUTANEOUS
  Administered 2017-01-29 – 2017-01-30 (×3): 2 [IU] via SUBCUTANEOUS

## 2017-01-28 MED ORDER — ACETAMINOPHEN 650 MG RE SUPP: 650.0000 mg | Freq: Four times a day (QID) | RECTAL | Status: DC | PRN

## 2017-01-28 MED ORDER — ALBUTEROL SULFATE (2.5 MG/3ML) 0.083% IN NEBU: 2.5000 mg | INHALATION_SOLUTION | RESPIRATORY_TRACT | Status: DC | PRN

## 2017-01-28 NOTE — Progress Notes (Signed)
Kristina Summers    Date of visit:  08/16/2016 DOB:  03-11-1936    Age:  81 yrs. Medical record number:  [id number]     Account number:  [id number] Primary Care Provider: Marlou Sa, ERIC L ____________________________ CURRENT DIAGNOSES  1. CAD Native without angina  2. Paroxysmal atrial fibrillation  3. Type 2 diabetes mellitus with diabetic polyneuropathy  4. Obesity  5. Hyperlipidemia  6. Long term (current) use of anticoagulants  7. Aortic valve disorder, unspecified  8. Hypertensive heart disease without heart failure  9. Cerebral Infarction Due To Embolism Of Left Middle Cerebral Artery  10. 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 Old myocardial infarction  17. Presence of aortocoronary bypass graft ____________________________ ALLERGIES  JANUVIA, Intolerance-dizziness  Lisinopril, Intolerance-cough  Morphine, Severe nausea & vomiting ____________________________ MEDICATIONS  1. amlodipine 5 mg tablet, 1 p.o. daily  2. atorvastatin 80 mg tablet, 1 p.o. daily  3. Eliquis 5 mg tablet, BID  4. furosemide 40 mg tablet, BID  5. losartan 50 mg tablet, BID  6. metoprolol tartrate 25 mg tablet, BID  7. multivitamin tablet, 1 p.o. daily  8. nitroglycerin 0.4 mg sublingual tablet, PRN  9. Novolin N 100 unit/mL subcutaneous suspension, 37u qam  31u qpm  10. Novolin R Regular U-100 Insulin 100 unit/mL injection solution, Take as directed ____________________________ CHIEF COMPLAINTS  Followup of Aortic valve disorder, unspecified  Followup of CAD Native without angina ____________________________ HISTORY OF PRESENT ILLNESS Patient returns for cardiac followup. She is getting along fairly well but has poor vision that is really affecting her. She has a moderately unsteady gait as well as some peripheral neuropathy. She has no angina and denies PND, orthopnea or edema. She now has persistent atrial fibrillation and has no bleeding  complications from anticoagulation. She denies any angina. Her expressive aphasia has improved to a moderate degree. She remains on Eliquis without bleeding. Echocardiogram today shows only mild/moderate aortic stenosis. ____________________________ PAST HISTORY  Past Medical Illnesses:  DM-insulin dependent, hypertension, hyperlipidemia, obesity, carpal tunnel syndrome, GI bleed 1/11, left MCA stroke 2016;  Cardiovascular Illnesses:  CAD, S/P MI-inferior 1992, atrial fibrillation, mild AV disease, peripheral vascular disease;  Surgical Procedures:  CABG w LIMA LAD, SVG to dx, SVG to OM, SVG to PD-PL 11/1998 Dr. Cyndia Bent, Excision of neck mass 1996 Dr. Lucia Gaskins, bilateral trigger thumb release;  NYHA Classification:  II;  Canadian Angina Classification:  Class 0: Asymptomatic;  Cardiology Procedures-Invasive:  cardiac cath (left) 2000, PTCA RCA 1993;  Cardiology Procedures-Noninvasive:  event monitor September 2010, lexiscan cardiolite November 2012, echocardiogram February 2016;  Cardiac Cath Results:  20 % stenosis distal Left main, 60% stenosis mid LAD, 70% stenosis proximal CFX, occluded RCA;  Peripheral Vascular Procedures:  stent right common iliac 03/31/2012 Dr. Bridgett Larsson, atherectomy left common femoral 06/17/11 Dr. Bridgett Larsson, carotid doppler September 2014;  LVEF of 60% documented via echocardiogram on 08/16/2016,   ____________________________ CARDIO-PULMONARY TEST DATES EKG Date:  08/16/2016;   Cardiac Cath Date:  11/13/1998;  CABG: 11/20/1998;  Holter/Event Monitor Date: 12/24/2008;  Nuclear Study Date:  03/18/2011;  Echocardiography Date: 05/30/2014;  Chest Xray Date: 04/22/2009;   ____________________________ FAMILY HISTORY Brother -- Brother dead, Diabetes mellitus, Cardiovascular disease Brother -- Brother alive with problem, Type 2 Diabetes Father -- Father dead, Myocardial infarction at greater than 59 Mother -- Mother dead, CVA ____________________________ SOCIAL HISTORY Alcohol Use:  no alcohol  use;  Smoking:  used to smoke but quit;  Diet:  regular diet without  modifications;  Lifestyle:  divorced and native of [place];  Exercise:  exercise is limited due to physical disability;  Occupation:  disabled;  Residence:  lives alone;  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;   ____________________________ REVIEW OF SYSTEMS General:  obesity, malaise and fatigue, no change in weight Eyes: diabetic retinopathy, laser treatments, cataract extraction with lens implants, repair of macular pucker, decreased acuity Respiratory: mild dyspnea with exertion Cardiovascular:  please review HPI Abdominal: denies dyspepsia, GI bleeding, constipation, or diarrheaGenitourinary-Female: frequency, stress incontinence, urgency Musculoskeletal:  generalized arthritis Neurological:  mild expressive aphasia, unsteady gait  ____________________________ PHYSICAL EXAMINATION VITAL SIGNS  Blood Pressure:  130/52 Standing, Left arm, large cuff  , 130/50 Sitting, Left arm and large cuff   Pulse:  64/min. Weight:  211.00 lbs. Height:  65.00"BMI: 35  Constitutional:  pleasant white female, in no acute distress, moderately obese Skin:  warm and dry to touch, no apparent skin lesions, or masses noted. Head:  normocephalic, normal hair pattern, no masses or tenderness ENT:  ears, nose and throat unremarkable, full mouth dentures present Neck:  bilateral carotid bruits, no JVD, no masses Chest:  clear to auscultation, healed median sternotomy scar Cardiac:  regular rhythm, normal S1 and S2, No S3 or S4, no murmurs, gallops or rubs detected. Peripheral Pulses:  femoral pulses 2+, posterior tibial pulses palpable Extremities & Back:  well healed saphenous vein donor site RLE, 1+ ankle edema right leg, bilateral lower extremity venous varicosities Neurological:  decreased sensation in feet, mild expressive aphasia, unsteady gait ____________________________ MOST RECENT LIPID PANEL 09/02/15  CHOL TOTL 126  mg/dl, LDL 74 NM, HDL 26 mg/dl, TRIGLYCER 132 mg/dl and CHOL/HDL 4.9 (Calc) ____________________________ IMPRESSIONS/PLAN  1. Persistent atrial fibrillation currently rate controlled by EKG 2. CAD with previous bypass 3. History of stroke with expressive aphasia 4. Long-term use of anticoagulation without complications 5. Diabetes mellitus with complications 6. Peripheral vascular disease with claudication 7. Mild to moderate aortic stenosis  Recommendations:  Continue current therapy. Echo reviewed with patient. Continue anticoagulation. Recommended followup in 6 months and call if problems. EKG personally reviewed by me shows atrial fibrillation with controlled response. ____________________________ TODAYS ORDERS  1. 12 Lead EKG: Today  2. Return Visit: 6 months                       ____________________________ Cardiology Physician:  Kerry Hough MD [facility]

## 2017-01-28 NOTE — H&P (Signed)
Patient Demographics:    Kristina Summers, is a 81 y.o. female  MRN: [id number]   DOB - 22-Apr-1935  Admit Date - 01/28/2017  Outpatient Primary MD for the patient is Rogers Blocker, MD   Assessment & Plan:    Principal Problem:   Acute exacerbation of Diastolic CHF (congestive heart failure) /EF 60 % Active Problems:   CAD (coronary artery disease)   Paroxysmal atrial fibrillation   Diabetes mellitus type 2 with peripheral artery disease (HCC)   S/P CABG (coronary artery bypass graft)   Cerebral infarction ([place])    1)HFpEF- Patient is presenting with symptoms and findings consistent with acute on chronic diastolic CHF exacerbation in the setting of severely elevated blood pressure with suspected flash pulmonary edema. Initial troponin is not elevated, we will Trend troponins, last known EF is 55-60% based on echo from 2016, will repeat echocardiogram. ED provider discussed this case with patient's cardiologist  Dr. Tollie Eth who recommends admission to hospitalist service. IV Lasix 40 mg every 12 hours, daily weight, fluid output and input monitoring. Topical Nitropaste as ordered . Clinically patient is presenting with mostly left-sided heart failure symptoms . ProBNP is 686 (no baseline), auscultative/exam  findings on chest x-ray findings suggest interstitial pulmonary edema  2)H/o PAF and h/o CAD with prior CABG- no frank ACS type symptoms at this time, continue metoprolol 25 mg twice a day for rate control and for CAD, continue Eliquis for anticoagulation (secondary stroke prophylaxis ) in the setting of atrial fibrillation with prior stroke  3)HTN- uncontrolled, on presentation systolic blood pressure was 197, okay to increase amlodipine to 10 mg from 5 mg, continue losartan 50 mg daily, metoprolol 25 mg twice a  day,  may use IV Hydralazine 10 mg  Every 4 hours Prn for systolic blood pressure over 160 mmhg  4)DM- okay to resume NPH insulin the 38 units twice a day, Use Novolog/Humalog Sliding scale insulin with Accu-Cheks/Fingersticks as ordered   5)H/o CVA- patient has some residual speech deficits, continue Lipitor 80 mg daily,  continue Eliquis for anticoagulation secondary stroke prophylaxis.  No acute neuro concerns at this time   With History of - Reviewed by me  Past Medical History:  Diagnosis Date  . Angina   . Arthritis   . Atrial fibrillation ([place])   . CAD (coronary artery disease)   . Carotid artery occlusion   . Diabetes mellitus   . Fibromyalgia   . GERD (gastroesophageal reflux disease)   . GI bleed 1/11  . Hyperlipidemia   . Hypertension   . Myocardial infarction ([place]) ~ 1991  . Normal nuclear stress test 03/18/2011  . Obesity   . Peripheral vascular disease ([place])   . Stroke [facility])    May 24, 2014  . UTI (lower urinary tract infection) Sept. 2015      Past Surgical History:  Procedure Laterality Date  . ABDOMINAL AORTAGRAM N/A 04/01/2011   Procedure: ABDOMINAL AORTAGRAM;  Surgeon: Conrad [place], MD;  Location: [facility] CATH LAB;  Service: Cardiovascular;  Laterality: N/A;  . ANGIOPLASTY  06/17/11   Left leg common femoral artery cannulation under u/s Left leg runoff  . CATARACT EXTRACTION W/ INTRAOCULAR LENS  IMPLANT, BILATERAL  2004-2005  . CORONARY ARTERY BYPASS GRAFT  2000   CABG X5  . EYE SURGERY    . LOWER EXTREMITY ANGIOGRAM Bilateral 04/01/2011   Procedure: LOWER EXTREMITY ANGIOGRAM;  Surgeon: Conrad Soper, MD;  Location: [facility] CATH LAB;  Service: Cardiovascular;  Laterality: Bilateral;  . LOWER EXTREMITY ANGIOGRAM Left 06/17/2011   Procedure: LOWER EXTREMITY ANGIOGRAM;  Surgeon: Conrad Harmon, MD;  Location: [facility] CATH LAB;  Service: Cardiovascular;  Laterality: Left;  . LOWER EXTREMITY ANGIOGRAM N/A 11/18/2011   Procedure: LOWER EXTREMITY ANGIOGRAM;  Surgeon: Conrad Fountain, MD;  Location: [facility] CATH LAB;  Service: Cardiovascular;  Laterality: N/A;  . PERCUTANEOUS STENT INTERVENTION Right 04/01/2011   Procedure: PERCUTANEOUS STENT INTERVENTION;  Surgeon: Conrad [place], MD;  Location: [facility] CATH LAB;  Service: Cardiovascular;  Laterality: Right;  rt iliac stent  . PERIPHERAL ARTERIAL STENT GRAFT  04/01/11   right common iliac  . TRIGGER FINGER RELEASE  1996   right thumb      Chief Complaint  Patient presents with  . Shortness of Breath  . Hypertension      HPI:    Kristina Summers  is a 81 y.o. female, With past medical history relevant for CAD with  Previous CABG, process mitral fibrillation with previous CVA, diabetes, stage II hypertension and history of diastolic dysfunction CHF with last known EF between 41 and 60% who presented by EMS with shortness of breath and orthopnea.   Symptoms apparently started around 3 AM while patient was recumbent, open sitting up shortness of breath  got slightly better, no fevers no chills no productive cough. Denies frank chest pains, no increased leg swelling and no pleuritic symptoms  In ED patient was found to have a systolic blood pressure of 197 and findings suggestive of pulmonary edema, she receive topical Nitropaste and IV Lasix 1 with good output and some symptomatic relief .  ED provider discussed this case with patient's cardiologist Dr. Tollie Eth was advised admission to hospitalist service.  Initial troponin and EKG does not reflect ACS     Review of systems:    In addition to the HPI above,   A full 12 point Review of 10 Systems was done, except as stated above, all other Review of 10 Systems were negative.    Social History:  Reviewed by me    Social History  Substance Use Topics  . Smoking status: Former Smoker    Packs/day: 2.00    Years: 30.00    Types: Cigarettes    Quit date: 04/19/1990  . Smokeless tobacco: Never Used     Comment: stopped smoking cigarettes 1991  . Alcohol use No         Family History :  Reviewed by me    Family History  Problem Relation Age of Onset  . Other Brother        intestinal blockage  . Diabetes Brother      Home Medications:   Prior to Admission medications   Medication Sig Start Date End Date Taking? Authorizing Provider  acetaminophen (TYLENOL) 325 MG tablet Take 2 tablets (650 mg total) by mouth every 6 (six) hours as needed for mild pain (or Fever >/= 101). 08/27/13  Yes  Theodis Blaze, MD  amLODipine (NORVASC) 5 MG tablet Take 1 tablet by mouth daily. 03/06/12  Yes [provider]  apixaban (ELIQUIS) 5 MG TABS tablet Take 1 tablet (5 mg total) by mouth 2 (two) times daily. 06/03/14  Yes Rai, Ripudeep K, MD  atorvastatin (LIPITOR) 80 MG tablet Take 1 tablet (80 mg total) by mouth daily. 11/27/12  Yes Baker, Zachary H, PA-C  furosemide (LASIX) 40 MG tablet Take 1 tablet (40 mg total) by mouth daily. Patient taking differently: Take 40 mg by mouth 2 (two) times daily.  06/03/14  Yes Rai, Ripudeep K, MD  HUMULIN R 100 UNIT/ML injection inject 5 units subcutaneously BEFORE BREAKFAST AND DINNER 11/26/16  Yes [provider]  insulin NPH Human (HUMULIN N,NOVOLIN N) 100 UNIT/ML injection Inject 0.38 mLs (38 Units total) into the skin 2 (two) times daily. 38 in am and 34 at night Patient taking differently: Inject 37 Units into the skin daily before breakfast. 37 in am and 30 at night,  Takes Humulin R 5Units am and pm 06/03/14  Yes Rai, Ripudeep K, MD  losartan (COZAAR) 50 MG tablet Take 1 tablet (50 mg total) by mouth daily. Patient taking differently: Take 50 mg by mouth 2 (two) times daily.  06/03/14  Yes Rai, Ripudeep K, MD  metoprolol tartrate (LOPRESSOR) 25 MG tablet Take 1 tablet by mouth 2 (two) times daily. 04/16/14  Yes [provider]  Multiple Vitamins-Minerals (ONE-A-DAY 50 PLUS PO) Take 1 tablet by mouth daily.   Yes [provider]  NITROSTAT 0.4 MG SL tablet Place 1 tablet under the tongue  every 5 (five) minutes x 3 doses as needed. Chest pain 03/07/14   [provider]     Allergies:     Allergies  Allergen Reactions  . Morphine And Related Nausea And Vomiting    Chest pain    . Lisinopril Cough  . Sertraline Hcl     REACTION: nausea  . Zoloft [Sertraline Hcl] Other (See Comments)    Nausea      Physical Exam:   Vitals  Blood pressure (!) 175/86, pulse 81, temperature (!) 97.5 F (36.4 C), temperature source Oral, resp. rate 18, height 5\' 6"  (1.676 m), weight 95.7 kg (211 lb), SpO2 95 %.  Physical Examination: General appearance - alert, obese appearing, able to speak in short sentences Mental status - alert, oriented to person, place, and time,  Eyes - sclera anicteric Neck - supple, no JVD elevation , Chest - diminished in bases with bibasilar rales  Heart - S1 and S2 normal, irregular, previous sternotomy scar Abdomen - soft, nontender, nondistended, no CVA tenderness  Neurological - screening mental status exam normal, neck supple without rigidity, speech problems which according to patient at his baseline from previous stroke , no new focal deficits  Extremities - trace to 1+ pitting edema, intact peripheral pulses , scar from areas where veins were harvested for CABG Skin - warm, dry Psych- some baseline speech and cognitive concerns from previous stroke, no new concerns from a neuropsychiatric standpoint at this time   Data Review:    CBC  Recent Labs Lab 01/28/17 0836  WBC 8.6  HGB 13.9  HCT 42.0  PLT 236  MCV 96.6  MCH 32.0  MCHC 33.1  RDW 13.4  LYMPHSABS 0.9  MONOABS 0.7  EOSABS 0.3  BASOSABS 0.1   ------------------------------------------------------------------------------------------------------------------  Chemistries   Recent Labs Lab 01/28/17 0836  NA 138  K 4.3  CL 108  CO2 24  GLUCOSE 113*  BUN 26*  CREATININE 0.97  CALCIUM 9.2    ------------------------------------------------------------------------------------------------------------------ estimated creatinine clearance is 54.9 mL/min (by C-G formula based on SCr of 0.97 mg/dL). ------------------------------------------------------------------------------------------------------------------ No results for input(s): TSH, T4TOTAL, T3FREE, THYROIDAB in the last 72 hours.  Invalid input(s): FREET3   Coagulation profile No results for input(s): INR, PROTIME in the last 168 hours. ------------------------------------------------------------------------------------------------------------------- No results for input(s): DDIMER in the last 72 hours. -------------------------------------------------------------------------------------------------------------------  Cardiac Enzymes  Recent Labs Lab 01/28/17 0836  TROPONINI <0.03   ------------------------------------------------------------------------------------------------------------------    Component Value Date/Time   BNP 686.0 (H) 01/28/2017 0836     ---------------------------------------------------------------------------------------------------------------  Urinalysis    Component Value Date/Time   COLORURINE YELLOW 05/29/2014 1052   APPEARANCEUR TURBID (A) 05/29/2014 1052   LABSPEC 1.016 05/29/2014 1052   PHURINE 5.5 05/29/2014 1052   GLUCOSEU 100 (A) 05/29/2014 1052   HGBUR LARGE (A) 05/29/2014 1052   BILIRUBINUR NEGATIVE 05/29/2014 1052   KETONESUR NEGATIVE 05/29/2014 1052   PROTEINUR 100 (A) 05/29/2014 1052   UROBILINOGEN 1.0 05/29/2014 1052   NITRITE NEGATIVE 05/29/2014 1052   LEUKOCYTESUR LARGE (A) 05/29/2014 1052    ----------------------------------------------------------------------------------------------------------------   Imaging Results:    Dg Chest Port 1 View  Result Date: 01/28/2017 CLINICAL DATA:  Pt c/o SOB x 1 day. Hx of AFIB, CAD, HTN, AND MI. Pt is a former  smoker. EXAM: PORTABLE CHEST 1 VIEW COMPARISON:  04/17/2014 FINDINGS: Status post median sternotomy. The heart is mildly enlarged. There is persistent prominence of interstitial markings compatible with interstitial pulmonary edema. No overt alveolar edema. There is mild bibasilar atelectasis. Chronic deformity of the right humerus. IMPRESSION: Persistent interstitial pulmonary edema. Electronically Signed   By: Nolon Nations M.D.   On: 01/28/2017 08:40    Radiological Exams on Admission: Dg Chest Port 1 View  Result Date: 01/28/2017 CLINICAL DATA:  Pt c/o SOB x 1 day. Hx of AFIB, CAD, HTN, AND MI. Pt is a former smoker. EXAM: PORTABLE CHEST 1 VIEW COMPARISON:  04/17/2014 FINDINGS: Status post median sternotomy. The heart is mildly enlarged. There is persistent prominence of interstitial markings compatible with interstitial pulmonary edema. No overt alveolar edema. There is mild bibasilar atelectasis. Chronic deformity of the right humerus. IMPRESSION: Persistent interstitial pulmonary edema. Electronically Signed   By: Nolon Nations M.D.   On: 01/28/2017 08:40    DVT Prophylaxis -SCD  /Eliquis AM Labs Ordered, also please review Full Orders  Family Communication: Admission, patients condition and plan of care including tests being ordered have been discussed with the patient   who indicate understanding and agree with the plan   Code Status - Full Code  Likely DC to  home  Condition   stable  Duane Earnshaw M.D on 01/28/2017 at 11:54 AM   Between 7am to 7pm - Pager - [phone] After 7pm go to [email or web address] - password [email or web address]  [facility] - Office  [phone]  Voice Recognition Viviann Spare dictation system was used to create this note, attempts have been made to correct errors. Please contact the author with questions and/or clarifications.

## 2017-01-28 NOTE — ED Notes (Signed)
Checked patient blood sugar it was 163 notified RN of blood sugar patient is resting with call bell in reach

## 2017-01-28 NOTE — Progress Notes (Signed)
Echocardiogram 2D Echocardiogram has been performed.  Kristina Summers 01/28/2017, 3:22 PM
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
## 2017-01-28 NOTE — Consult Note (Signed)
Cardiology Consult Note  Admit date: 01/28/2017 Name: Kristina Summers 81 y.o.  female DOB:  [birth year] MRN:  [id number]  Today's date:  01/28/2017  Referring Physician:   [name]  Primary Physician:    Dr. [name]  Reason for Consultation:    Shortness of breath  IMPRESSIONS: 1.  Acute diastolic congestive heart failure-this could be due to valvular heart disease or could be silent ischemia-I could not come up with any thing under lifestyle or change in medicines that could account for this. 2.  Coronary artery disease with previous inferior infarction and previous coronary bypass grafting in 2000 3.  Cerebrovascular vascular disease with previous left parietal stroke and residual expressive aphasia 4.  Peripheral vascular disease with previous claudication and surgery 5.  Insulin-dependent diabetes with peripheral neuropathy and retinopathy 6.  Hypertensive heart disease 7.  Chronic atrial fibrillation 8.  Chronic diastolic heart failure  RECOMMENDATION: 1.  Await results of echocardiogram 2.  Continue diuresis and watch creatinine and she is diuresed 3.  Will need blood pressure medicines titrated to control blood pressure 4.  Evaluate serial troponins. 5.  Further workup depending on results of the above.  HISTORY: This 81 year old female has a long-standing history of coronary artery disease.  She eventually had bypass grafting in 2000 with a mammary graft to the LAD and vein graft to the diagonal and vein graft to the marginal and a sequential vein graft to the posterior descending and posterolateral branch.  She has a prior history of GI bleeding.  She has known chronic atrial fibrillation and had a left MCA stroke in 2016.  She has significant diabetes that is complicated with retinopathy as well as peripheral neuropathy.  She has had some mild chronic diastolic heart failure in the past.  She has had a previous MCA stroke and has to walk with a walker.  She has  had elevation of blood pressure recently and was in her usual state of health until she was awakened at 3 AM with acute dyspnea.  This persisted until she called EMS this morning.  Her BNP was mildly elevated.  She has not had any change in her medications other than a change in her insulin regimen and states that she has not missed any doses of her medicine.  She has not used any nonsteroidal anti-inflammatory agents.  She did not have any chest pain and initial troponin is unremarkable.  She does have a history of aortic stenosis and was reevaluated by an echo in my office showing a peak gradient of 2.45 m/s around 6 months ago.  She does have blood pressure that is not well controlled and it was elevated on admission.  Past Medical History:  Diagnosis Date  . Angina   . Arthritis   . Atrial fibrillation ([place])   . CAD (coronary artery disease)   . Carotid artery occlusion   . Diabetes mellitus   . Diastolic CHF ([place]) 78/4696  . Fibromyalgia   . GERD (gastroesophageal reflux disease)   . GI bleed 1/11  . Hyperlipidemia   . Hypertension   . Myocardial infarction ([place]) ~ 1991  . Normal nuclear stress test 03/18/2011  . Obesity   . Peripheral vascular disease ([place])   . Stroke [facility])    May 24, 2014  . UTI (lower urinary tract infection) Sept. 2015     Past Surgical History:  Procedure Laterality Date  . ABDOMINAL AORTAGRAM N/A 04/01/2011   Procedure: ABDOMINAL AORTAGRAM;  Surgeon:  Conrad [place], MD;  Location: [facility] CATH LAB;  Service: Cardiovascular;  Laterality: N/A;  . ANGIOPLASTY  06/17/11   Left leg common femoral artery cannulation under u/s Left leg runoff  . CATARACT EXTRACTION W/ INTRAOCULAR LENS  IMPLANT, BILATERAL  2004-2005  . CORONARY ARTERY BYPASS GRAFT  2000   CABG X5  . EYE SURGERY    . LOWER EXTREMITY ANGIOGRAM Bilateral 04/01/2011   Procedure: LOWER EXTREMITY ANGIOGRAM;  Surgeon: Conrad [place], MD;  Location: [facility] CATH LAB;  Service: Cardiovascular;  Laterality:  Bilateral;  . LOWER EXTREMITY ANGIOGRAM Left 06/17/2011   Procedure: LOWER EXTREMITY ANGIOGRAM;  Surgeon: Conrad [place], MD;  Location: [facility] CATH LAB;  Service: Cardiovascular;  Laterality: Left;  . LOWER EXTREMITY ANGIOGRAM N/A 11/18/2011   Procedure: LOWER EXTREMITY ANGIOGRAM;  Surgeon: Conrad [place], MD;  Location: [facility] CATH LAB;  Service: Cardiovascular;  Laterality: N/A;  . PERCUTANEOUS STENT INTERVENTION Right 04/01/2011   Procedure: PERCUTANEOUS STENT INTERVENTION;  Surgeon: Conrad , MD;  Location: [facility] CATH LAB;  Service: Cardiovascular;  Laterality: Right;  rt iliac stent  . PERIPHERAL ARTERIAL STENT GRAFT  04/01/11   right common iliac  . TRIGGER FINGER RELEASE  1996   right thumb    Allergies:  is allergic to morphine and related; lisinopril; sertraline hcl; and zoloft [sertraline hcl].   Medications: Prior to Admission medications   Medication Sig Start Date End Date Taking? Authorizing Provider  acetaminophen (TYLENOL) 325 MG tablet Take 2 tablets (650 mg total) by mouth every 6 (six) hours as needed for mild pain (or Fever >/= 101). 08/27/13  Yes Theodis Blaze, MD  amLODipine (NORVASC) 5 MG tablet Take 1 tablet by mouth daily. 03/06/12  Yes [provider]  apixaban (ELIQUIS) 5 MG TABS tablet Take 1 tablet (5 mg total) by mouth 2 (two) times daily. 06/03/14  Yes Rai, Ripudeep K, MD  atorvastatin (LIPITOR) 80 MG tablet Take 1 tablet (80 mg total) by mouth daily. 11/27/12  Yes Baker, Zachary H, PA-C  furosemide (LASIX) 40 MG tablet Take 1 tablet (40 mg total) by mouth daily. Patient taking differently: Take 40 mg by mouth 2 (two) times daily.  06/03/14  Yes Rai, Ripudeep K, MD  HUMULIN R 100 UNIT/ML injection inject 5 units subcutaneously BEFORE BREAKFAST AND DINNER 11/26/16  Yes [provider]  insulin NPH Human (HUMULIN N,NOVOLIN N) 100 UNIT/ML injection Inject 0.38 mLs (38 Units total) into the skin 2 (two) times daily. 38 in am and 34 at night Patient taking  differently: Inject 37 Units into the skin daily before breakfast. 37 in am and 30 at night,  Takes Humulin R 5Units am and pm 06/03/14  Yes Rai, Ripudeep K, MD  losartan (COZAAR) 50 MG tablet Take 1 tablet (50 mg total) by mouth daily. Patient taking differently: Take 50 mg by mouth 2 (two) times daily.  06/03/14  Yes Rai, Ripudeep K, MD  metoprolol tartrate (LOPRESSOR) 25 MG tablet Take 1 tablet by mouth 2 (two) times daily. 04/16/14  Yes [provider]  Multiple Vitamins-Minerals (ONE-A-DAY 50 PLUS PO) Take 1 tablet by mouth daily.   Yes [provider]  NITROSTAT 0.4 MG SL tablet Place 1 tablet under the tongue every 5 (five) minutes x 3 doses as needed. Chest pain 03/07/14   [provider]   Family History: Family Status  Relation Status  . Brother Alive       diabetes  . Father Deceased at age 54  died of heart disease  . Mother Deceased at age 4       died of CVA, had history of CAD  . Brother Deceased at age 42       died of pneumonia   Social History:   reports that she quit smoking about 26 years ago. Her smoking use included Cigarettes. She has a 60.00 pack-year smoking history. She has never used smokeless tobacco. She reports that she does not drink alcohol or use drugs.   Social History   Social History Narrative   Divorced.  Native of [place].  Formerly worked as Education administrator person. 01/13/17 lives alone   Caffeine use: drinks decaf tea and coffee      Review of Systems: She has poor vision due to diabetes and she complains of an unsteady gait.  She denies angina and has occasional urinary frequency.  Mild expressive aphasia significant numbness of feet.  Other than as noted above the remainder of the review of systems is unremarkable.  Physical Exam: BP (!) 164/84 (BP Location: Left Arm)   Pulse 92   Temp 97.8 F (36.6 C) (Oral)   Resp 18   Ht 5\' 6"  (1.676 m)   Wt 95 kg (209 lb 8 oz)   SpO2 98%   BMI 33.81 kg/m   General  appearance: Obese pleasant female in no acute distress Head: Normocephalic, without obvious abnormality, atraumatic Eyes: conjunctivae/corneas clear. PERRL, EOM's intact. Fundi benign. Neck: no adenopathy, no carotid bruit, no JVD and supple, symmetrical, trachea midline Lungs: clear to auscultation bilaterally Heart: Irregular rhythm, normal S1 and S2, harsh 2/6 systolic murmur with radiation to the carotids and along the left sternal border faint 1/6 diastolic murmur Abdomen: soft, non-tender; bowel sounds normal; no masses,  no organomegaly Pelvic: deferred Extremities: Normal range of motion, 1+ edema, no spinal abnormalities noted Pulses: Femoral pulses present, peripheral pulses are reduced at 1+. Skin: Warm and dry without lesions or mass Neurologic: Mild expressive aphasia but gets speech out well, reduced sensation in feet, mildly unsteady gait Psych: Alert and oriented x 3 Labs: CBC  Recent Labs  01/28/17 0836  WBC 8.6  RBC 4.35  HGB 13.9  HCT 42.0  PLT 236  MCV 96.6  MCH 32.0  MCHC 33.1  RDW 13.4  LYMPHSABS 0.9  MONOABS 0.7  EOSABS 0.3  BASOSABS 0.1   CMP   Recent Labs  01/28/17 0836  NA 138  K 4.3  CL 108  CO2 24  GLUCOSE 113*  BUN 26*  CREATININE 0.97  CALCIUM 9.2  GFRNONAA 54*  GFRAA >60   BNP (last 3 results) BNP    Component Value Date/Time   BNP 686.0 (H) 01/28/2017 0836   Cardiac Panel (last 3 results)  Recent Labs  01/28/17 0836  TROPONINI <0.03     Radiology:  Persistent interstitial pulmonary markings   EKG: Atrial fibrillation with controlled ventricular response, IV conduction delay type undetermined, nonspecific ST and T-wave changes Independently reviewed by me  Signed:  W. Doristine Church MD [facility]   Cardiology Consultant  01/28/2017, 3:57 PM

## 2017-01-28 NOTE — Progress Notes (Signed)
Pt's troponin result is elevated at 0.04. MD on call paged, waiting for response

## 2017-01-28 NOTE — ED Notes (Signed)
Report called to [place] RN all belongings taken with pt to floor.

## 2017-01-28 NOTE — ED Notes (Signed)
Pt assisted with bed pan

## 2017-01-28 NOTE — ED Provider Notes (Signed)
[facility] DEPT Provider Note   CSN: [id number] Arrival date & time: 01/28/17  0740     History   Chief Complaint Chief Complaint  Patient presents with  . Shortness of Breath  . Hypertension    HPI Kristina Summers is a 81 y.o. female.complains of shortness of breath which awakened her from sleep 3 AM today. She denies cough denies fever denies chest pain. Shortness breath is worse with lying supine improved with sitting upright. No other associated symptoms Treated with oxygen prior to coming here her pulse oximetry on room air was 94% per EMS oxygen brought pulse oximetry up to 96%.  HPI  Past Medical History:  Diagnosis Date  . Angina   . Arthritis   . Atrial fibrillation ([place])   . CAD (coronary artery disease)   . Carotid artery occlusion   . Diabetes mellitus   . Fibromyalgia   . GERD (gastroesophageal reflux disease)   . GI bleed 1/11  . Hyperlipidemia   . Hypertension   . Myocardial infarction ([place]) ~ 1991  . Normal nuclear stress test 03/18/2011  . Obesity   . Peripheral vascular disease ([place])   . Stroke [facility])    May 24, 2014  . UTI (lower urinary tract infection) Sept. 2015    Patient Active Problem List   Diagnosis Date Noted  . CVA (cerebral infarction) Sep 27, 202016  . Cerebral embolism with cerebral infarction ([place]) Sep 27, 202016  . Fall   . Stroke ([place])   . UTI (lower urinary tract infection) 05/29/2014  . Fracture of distal end of right fibula 05/29/2014  . Rhabdomyolysis 05/29/2014  . Transaminitis 05/29/2014  . Weakness of both lower extremities 01/11/2014  . Pain of left lower extremity- and Right 01/11/2014  . Aftercare following surgery of the circulatory system, [place] 01/11/2014  . Peripheral vascular disease, unspecified ([place]) 01/11/2014  . Cellulitis of great toe, right 08/23/2013  . CAD (coronary artery disease)   . Hypertensive heart disease without CHF   . Paroxysmal atrial fibrillation   . Diabetes mellitus type 2 with peripheral  artery disease ([place])   . Obesity (BMI 30-39.9)   . Hyperlipidemia   . Atherosclerosis of native arteries of extremity with intermittent claudication ([place])   . History of GI bleed 04/21/2009  . S/P CABG (coronary artery bypass graft) 11/20/1998    Past Surgical History:  Procedure Laterality Date  . ABDOMINAL AORTAGRAM N/A 04/01/2011   Procedure: ABDOMINAL AORTAGRAM;  Surgeon: Conrad Hardin, MD;  Location: [facility] CATH LAB;  Service: Cardiovascular;  Laterality: N/A;  . ANGIOPLASTY  06/17/11   Left leg common femoral artery cannulation under u/s Left leg runoff  . CATARACT EXTRACTION W/ INTRAOCULAR LENS  IMPLANT, BILATERAL  2004-2005  . CORONARY ARTERY BYPASS GRAFT  2000   CABG X5  . EYE SURGERY    . LOWER EXTREMITY ANGIOGRAM Bilateral 04/01/2011   Procedure: LOWER EXTREMITY ANGIOGRAM;  Surgeon: Conrad [place]own, MD;  Location: [facility] CATH LAB;  Service: Cardiovascular;  Laterality: Bilateral;  . LOWER EXTREMITY ANGIOGRAM Left 06/17/2011   Procedure: LOWER EXTREMITY ANGIOGRAM;  Surgeon: Conrad [place], MD;  Location: [facility] CATH LAB;  Service: Cardiovascular;  Laterality: Left;  . LOWER EXTREMITY ANGIOGRAM N/A 11/18/2011   Procedure: LOWER EXTREMITY ANGIOGRAM;  Surgeon: Conrad Elkridge, MD;  Location: [facility] CATH LAB;  Service: Cardiovascular;  Laterality: N/A;  . PERCUTANEOUS STENT INTERVENTION Right 04/01/2011   Procedure: PERCUTANEOUS STENT INTERVENTION;  Surgeon: Conrad Harrietta, MD;  Location: [facility] CATH LAB;  Service: Cardiovascular;  Laterality: Right;  rt iliac stent  . PERIPHERAL ARTERIAL STENT GRAFT  04/01/11   right common iliac  . TRIGGER FINGER RELEASE  1996   right thumb    OB History    No data available       Home Medications    Prior to Admission medications   Medication Sig Start Date End Date Taking? Authorizing Provider  acetaminophen (TYLENOL) 325 MG tablet Take 2 tablets (650 mg total) by mouth every 6 (six) hours as needed for mild pain (or Fever >/= 101). Patient not taking: Reported on  01/13/2017 08/27/13   Theodis Blaze, MD  amLODipine (NORVASC) 5 MG tablet Take 1 tablet by mouth daily. 03/06/12   [provider]  apixaban (ELIQUIS) 5 MG TABS tablet Take 1 tablet (5 mg total) by mouth 2 (two) times daily. 06/03/14   Rai, Vernelle Emerald, MD  atorvastatin (LIPITOR) 80 MG tablet Take 1 tablet (80 mg total) by mouth daily. 11/27/12   Luana Shu, Freeman Caldron, PA-C  furosemide (LASIX) 40 MG tablet Take 1 tablet (40 mg total) by mouth daily. Patient taking differently: Take 40 mg by mouth 2 (two) times daily.  06/03/14   Rai, Ripudeep Raliegh Ip, MD  HUMULIN R 100 UNIT/ML injection inject 5 units subcutaneously BEFORE BREAKFAST AND DINNER 11/26/16   [provider]  insulin NPH Human (HUMULIN N,NOVOLIN N) 100 UNIT/ML injection Inject 0.38 mLs (38 Units total) into the skin 2 (two) times daily. 38 in am and 34 at night Patient taking differently: Inject 37 Units into the skin daily before breakfast. 37 in am and 30 at night,  Takes Humulin R 5Units am and pm 06/03/14   Rai, Ripudeep K, MD  losartan (COZAAR) 50 MG tablet Take 1 tablet (50 mg total) by mouth daily. Patient taking differently: Take 50 mg by mouth 2 (two) times daily.  06/03/14   Rai, Vernelle Emerald, MD  metoprolol tartrate (LOPRESSOR) 25 MG tablet Take 1 tablet by mouth 2 (two) times daily. 04/16/14   [provider]  Multiple Vitamins-Minerals (ONE-A-DAY 50 PLUS PO) Take 1 tablet by mouth daily.    [provider]  NITROSTAT 0.4 MG SL tablet Place 1 tablet under the tongue every 5 (five) minutes x 3 doses as needed. Chest pain 03/07/14   [provider]    Family History Family History  Problem Relation Age of Onset  . Other Brother        intestinal blockage  . Diabetes Brother     Social History Social History  Substance Use Topics  . Smoking status: Former Smoker    Packs/day: 2.00    Years: 30.00    Types: Cigarettes    Quit date: 04/19/1990  . Smokeless tobacco: Never Used     Comment:  stopped smoking cigarettes 1991  . Alcohol use No     Allergies   Morphine and related; Lisinopril; Sertraline hcl; and Zoloft [sertraline hcl]   Review of Systems Review of Systems  Respiratory: Positive for shortness of breath.   Allergic/Immunologic: Positive for immunocompromised state.       Diabetic  All other systems reviewed and are negative.    Physical Exam Updated Vital Signs BP (!) 177/72 (BP Location: Right Arm)   Pulse 91   Temp (!) 97.5 F (36.4 C) (Oral)   Resp (!) 27   Ht 5\' 6"  (1.676 m)   Wt 95.7 kg (211 lb)   SpO2 91%   BMI 34.06 kg/m  Physical Exam  Constitutional: She appears well-developed and well-nourished. She appears distressed.  HENT:  Head: Normocephalic and atraumatic.  Eyes: Pupils are equal, round, and reactive to light. Conjunctivae are normal.  Neck: Neck supple. No tracheal deviation present. No thyromegaly present.  Cardiovascular: Normal rate.   No murmur heard. rregularly irregular  Pulmonary/Chest: She is in respiratory distress.  Moderately dyspneicRales at bases bilaterally  Abdominal: Soft. Bowel sounds are normal. She exhibits no distension. There is no tenderness.  Musculoskeletal: Normal range of motion. She exhibits no edema or tenderness.  Neurological: She is alert. Coordination normal.  Skin: Skin is warm and dry. No rash noted.  Psychiatric: She has a normal mood and affect.  Nursing note and vitals reviewed.    ED Treatments / Results  Labs (all labs ordered are listed, but only abnormal results are displayed) Labs Reviewed  TROPONIN I  BASIC METABOLIC PANEL  CBC WITH DIFFERENTIAL/PLATELET  BRAIN NATRIURETIC PEPTIDE    EKG  EKG Interpretation  Date/Time:  Friday January 28 2017 08:00:11 EDT Ventricular Rate:  84 PR Interval:    QRS Duration: 119 QT Interval:  381 QTC Calculation: 445 R Axis:   94 Text Interpretation:  Atrial fibrillation Nonspecific intraventricular conduction delay Borderline  repolarization abnormality Baseline wander in lead(s) II III aVF Non-specific intra-ventricular conduction delay New since previous tracing Confirmed by Orlie Dakin [phone]) on 01/28/2017 8:06:58 AM       Radiology No results found.  Procedures Procedures (including critical care time)  Medications Ordered in ED Medications - No data to display  Chest x-ray viewed by me Results for orders placed or performed during the hospital encounter of 01/28/17  Troponin I  Result Value Ref Range   Troponin I <0.03 <0.03 ng/mL  Basic metabolic panel  Result Value Ref Range   Sodium 138 135 - 145 mmol/L   Potassium 4.3 3.5 - 5.1 mmol/L   Chloride 108 101 - 111 mmol/L   CO2 24 22 - 32 mmol/L   Glucose, Bld 113 (H) 65 - 99 mg/dL   BUN 26 (H) 6 - 20 mg/dL   Creatinine, Ser 0.97 0.44 - 1.00 mg/dL   Calcium 9.2 8.9 - 10.3 mg/dL   GFR calc non Af Amer 54 (L) >60 mL/min   GFR calc Af Amer >60 >60 mL/min   Anion gap 6 5 - 15  CBC with Differential/Platelet  Result Value Ref Range   WBC 8.6 4.0 - 10.5 K/uL   RBC 4.35 3.87 - 5.11 MIL/uL   Hemoglobin 13.9 12.0 - 15.0 g/dL   HCT 42.0 36.0 - 46.0 %   MCV 96.6 78.0 - 100.0 fL   MCH 32.0 26.0 - 34.0 pg   MCHC 33.1 30.0 - 36.0 g/dL   RDW 13.4 11.5 - 15.5 %   Platelets 236 150 - 400 K/uL   Neutrophils Relative % 77 %   Neutro Abs 6.7 1.7 - 7.7 K/uL   Lymphocytes Relative 10 %   Lymphs Abs 0.9 0.7 - 4.0 K/uL   Monocytes Relative 9 %   Monocytes Absolute 0.7 0.1 - 1.0 K/uL   Eosinophils Relative 3 %   Eosinophils Absolute 0.3 0.0 - 0.7 K/uL   Basophils Relative 1 %   Basophils Absolute 0.1 0.0 - 0.1 K/uL  Brain natriuretic peptide  Result Value Ref Range   B Natriuretic Peptide 686.0 (H) 0.0 - 100.0 pg/mL   Dg Chest Port 1 View  Result Date: 01/28/2017 CLINICAL DATA:  Pt c/o SOB  x 1 day. Hx of AFIB, CAD, HTN, AND MI. Pt is a former smoker. EXAM: PORTABLE CHEST 1 VIEW COMPARISON:  04/17/2014 FINDINGS: Status post median sternotomy. The  heart is mildly enlarged. There is persistent prominence of interstitial markings compatible with interstitial pulmonary edema. No overt alveolar edema. There is mild bibasilar atelectasis. Chronic deformity of the right humerus. IMPRESSION: Persistent interstitial pulmonary edema. Electronically Signed   By: Nolon Nations M.D.   On: 01/28/2017 08:40    Initial Impression / Assessment and Plan / ED Course  I have reviewed the triage vital signs and the nursing notes.  Pertinent labs & imaging results that were available during my care of the patient were reviewed by me and considered in my medical decision making (see chart for details).   11:05 AM patient reports breathing much improved after treatment with intravenous Lasix and topical nitroglycerin I spoke with Dr. [name] by telephone. He requests admission to hospitalist service. He will see patient in the hospital if hospitalist physician request formal consultation. I consulted hospitalistwho will arrange for overnight stay   Final Clinical Impressions(s) / ED Diagnoses  Diagnosis acute pulmonary edema Final diagnoses:  None   CRITICAL CARE Performed by: Orlie Dakin Total critical care time: 30 minutes Critical care time was exclusive of separately billable procedures and treating other patients. Critical care was necessary to treat or prevent imminent or life-threatening deterioration. Critical care was time spent personally by me on the following activities: development of treatment plan with patient and/or surrogate as well as nursing, discussions with consultants, evaluation of patient's response to treatment, examination of patient, obtaining history from patient or surrogate, ordering and performing treatments and interventions, ordering and review of laboratory studies, ordering and review of radiographic studies, pulse oximetry and re-evaluation of patient's condition. New Prescriptions New Prescriptions   No medications  on file     Orlie Dakin, MD 01/28/17 1109

## 2017-01-29 DIAGNOSIS — I2583 Coronary atherosclerosis due to lipid rich plaque: Secondary | ICD-10-CM

## 2017-01-29 DIAGNOSIS — E1151 Type 2 diabetes mellitus with diabetic peripheral angiopathy without gangrene: Secondary | ICD-10-CM

## 2017-01-29 DIAGNOSIS — I4891 Unspecified atrial fibrillation: Secondary | ICD-10-CM

## 2017-01-29 DIAGNOSIS — I5033 Acute on chronic diastolic (congestive) heart failure: Secondary | ICD-10-CM

## 2017-01-29 DIAGNOSIS — Z951 Presence of aortocoronary bypass graft: Secondary | ICD-10-CM

## 2017-01-29 DIAGNOSIS — I2510 Atherosclerotic heart disease of native coronary artery without angina pectoris: Secondary | ICD-10-CM

## 2017-01-29 LAB — CBC
HEMATOCRIT: 37.4 % (ref 36.0–46.0)
HEMOGLOBIN: 12.3 g/dL (ref 12.0–15.0)
MCH: 31.8 pg (ref 26.0–34.0)
MCHC: 32.9 g/dL (ref 30.0–36.0)
MCV: 96.6 fL (ref 78.0–100.0)
Platelets: 224 10*3/uL (ref 150–400)
RBC: 3.87 MIL/uL (ref 3.87–5.11)
RDW: 13.4 % (ref 11.5–15.5)
WBC: 8.4 10*3/uL (ref 4.0–10.5)

## 2017-01-29 LAB — BASIC METABOLIC PANEL
ANION GAP: 8 (ref 5–15)
BUN: 30 mg/dL — ABNORMAL HIGH (ref 6–20)
CO2: 24 mmol/L (ref 22–32)
Calcium: 8.8 mg/dL — ABNORMAL LOW (ref 8.9–10.3)
Chloride: 104 mmol/L (ref 101–111)
Creatinine, Ser: 1.05 mg/dL — ABNORMAL HIGH (ref 0.44–1.00)
GFR, EST AFRICAN AMERICAN: 57 mL/min — AB (ref >60)
GFR, EST NON AFRICAN AMERICAN: 49 mL/min — AB (ref >60)
GLUCOSE: 131 mg/dL — AB (ref 65–99)
POTASSIUM: 4.1 mmol/L (ref 3.5–5.1)
Sodium: 136 mmol/L (ref 135–145)

## 2017-01-29 LAB — GLUCOSE, CAPILLARY
GLUCOSE-CAPILLARY: 96 mg/dL (ref 65–99)
GLUCOSE-CAPILLARY: 123 mg/dL — AB (ref 65–99)
GLUCOSE-CAPILLARY: 180 mg/dL — AB (ref 65–99)
Glucose-Capillary: 131 mg/dL — ABNORMAL HIGH (ref 65–99)

## 2017-01-29 LAB — TROPONIN I: TROPONIN I: 0.03 ng/mL — AB (ref <0.03)

## 2017-01-29 MED ORDER — POTASSIUM CHLORIDE CRYS ER 20 MEQ PO TBCR
40.0000 meq | EXTENDED_RELEASE_TABLET | Freq: Every day | ORAL | Status: DC
  Administered 2017-01-29 – 2017-01-30 (×2): 40 meq via ORAL
  Filled 2017-01-29 (×2): qty 2

## 2017-01-29 MED ORDER — LOSARTAN POTASSIUM 50 MG PO TABS
100.0000 mg | ORAL_TABLET | Freq: Every day | ORAL | Status: DC
  Administered 2017-01-30: 100 mg via ORAL
  Filled 2017-01-29: qty 2

## 2017-01-29 MED ORDER — FUROSEMIDE 10 MG/ML IJ SOLN
40.0000 mg | Freq: Two times a day (BID) | INTRAMUSCULAR | Status: DC
  Administered 2017-01-30 (×2): 40 mg via INTRAVENOUS
  Filled 2017-01-29 (×2): qty 4

## 2017-01-29 NOTE — Progress Notes (Signed)
Patient's heart rate down to 37 not sustained. Heart rate sustaining 40s-50s. Patient asymptomatic.  Dr. Loleta Books made aware and stopped metoprolol.  Will continue to monitor patient.

## 2017-01-29 NOTE — Progress Notes (Signed)
PROGRESS NOTE    [name]  EHO:[id number] DOB: [birth year] DOA: 01/28/2017 PCP: Rogers Blocker, MD  Outpatient Specialists: Dr. [name], Cardiology     Brief Narrative:  Kristina Summers is a 81 yo F with PMHx significant for HFpEF, CAD s/p CABG, hx CVA, DM, and HTN who presents with sudden onset SOB from sleep.  Woke yesterday morning at 3AM (lives alone in ALF) with severe dyspnea.  Not relieved completely with sitting up, so she called EMS.  Initially hypertensive and CXR showed edema, so she was given nitropaste and Lasix with some symptomatic relief and admitted for CHF flare.   Assessment & Plan:  Principal Problem:   Acute exacerbation of Diastolic CHF (congestive heart failure) /EF 60 % Active Problems:   CAD (coronary artery disease)   Paroxysmal atrial fibrillation   Diabetes mellitus type 2 with peripheral artery disease (HCC)   S/P CABG (coronary artery bypass graft)   Cerebral infarction (HCC)   Acute on chronic diastolic CHF -Furosemide 40 mg IV twice a day  -K supplement -Strict I/Os, daily weights, telemetry  -Daily monitoring renal function -Echo shows stable EF, no clear RWMA, no new valve disease   Hypertensive urgency -Continue home amlodipine -Increase losartan -Continue diuresis -Discontinue nitropaste -Hold metoprolol for bradycardia -Hydralazine PRN for hypertension  Troponin elevation Suspect type 2 NSTEMI.  Will defer heparin to Cardiology.  Very low level.  Has peaked. -Consult to Cardiology, appreciate cares  CAD s/p CABG  -Continue statin, ARB  Atrial fibrillation CHADS2Vasc 8.   -Continue apixaban -Hold metoprolol given bradycardia  Diabetes -Continue home NPH -Continue mealtime insulin -SSI  Bradycardia Brief, asymptomati -Hold metoprolol -Monitor on tele      DVT prophylaxis: N/A Code Status: FULL Family Communication: None present Disposition Plan: Further diuresis, managing blood pressure.   Consultants:    Cardiology  Procedures:   Echocardiogram Study Conclusions  - Left ventricle: The cavity size was normal. There was mild   concentric hypertrophy. Systolic function was normal. The   estimated ejection fraction was in the range of 50% to 55%.   Although no diagnostic regional wall motion abnormality was   identified, this possibility cannot be completely excluded on the   basis of this study. - Ventricular septum: Septal motion showed paradox. These changes   are consistent with a left bundle branch block. - Aortic valve: There was moderate stenosis. There was mild to   moderate regurgitation directed centrally in the LVOT. Valve area   (VTI): 1.36 cm^2. Valve area (Vmax): 1.11 cm^2. Valve area   (Vmean): 1.14 cm^2. - Mitral valve: Calcified annulus. Mildly thickened leaflets .   There was mild regurgitation. - Left atrium: The atrium was moderately dilated. - Right atrium: The atrium was moderately dilated. - Pulmonary arteries: PA peak pressure: 32 mm Hg (S).   Subjective: Feeling okay, walked with MA, still some SOB.  No ongoing chest pain.  Otherwise 12 systems reviewed and negative.  Objective: Vitals:   01/28/17 2106 01/28/17 2330 01/29/17 0523 01/29/17 1159  BP: (!) 138/54 (!) 153/59 (!) 151/61 134/61  Pulse: 65 82 64 60  Resp: 18 18 19 20   Temp: 98 F (36.7 C) 98.3 F (36.8 C) 98.4 F (36.9 C) 98.2 F (36.8 C)  TempSrc: Oral Oral Oral Oral  SpO2: 97% 96% 96% 93%  Weight:   95.2 kg (209 lb 14.1 oz)   Height:        Intake/Output Summary (Last 24 hours) at 01/29/17 1447 Last data  filed at 01/29/17 1359  Gross per 24 hour  Intake              960 ml  Output             2150 ml  Net            -1190 ml   Filed Weights   01/28/17 0759 01/28/17 1248 01/29/17 0523  Weight: 95.7 kg (211 lb) 95 kg (209 lb 8 oz) 95.2 kg (209 lb 14.1 oz)    Examination:  General exam: Appears calm and comfortable  Respiratory system: Clear to auscultation. Crackles at  bases.  Some increased WOB with movement. Cardiovascular system: S1 & S2 heard, RRR. SEM noted. No JVD, rubs, gallops or clicks. No pedal edema. Gastrointestinal system: Abdomen is nondistended, soft and nontender. No organomegaly or masses felt. Normal bowel sounds heard. Central nervous system: Alert. No focal neurological deficits.   Extremities: Symmetric 5 x 5 power. Skin: No rashes, lesions or ulcers Psychiatry: Judgement and insight appear normal. Mood & affect appropriate.     Data Reviewed: I have personally reviewed following labs and imaging studies  CBC:  Recent Labs Lab 01/28/17 0836 01/29/17 0541  WBC 8.6 8.4  NEUTROABS 6.7  --   HGB 13.9 12.3  HCT 42.0 37.4  MCV 96.6 96.6  PLT 236 401   Basic Metabolic Panel:  Recent Labs Lab 01/28/17 0836 01/29/17 0541  NA 138 136  K 4.3 4.1  CL 108 104  CO2 24 24  GLUCOSE 113* 131*  BUN 26* 30*  CREATININE 0.97 1.05*  CALCIUM 9.2 8.8*   GFR: Estimated Creatinine Clearance: 50.5 mL/min (A) (by C-G formula based on SCr of 1.05 mg/dL (H)). Liver Function Tests: No results for input(s): AST, ALT, ALKPHOS, BILITOT, PROT, ALBUMIN in the last 168 hours. No results for input(s): LIPASE, AMYLASE in the last 168 hours. No results for input(s): AMMONIA in the last 168 hours. Coagulation Profile: No results for input(s): INR, PROTIME in the last 168 hours. Cardiac Enzymes:  Recent Labs Lab 01/28/17 0836 01/28/17 1618 01/28/17 2130 01/29/17 1002  TROPONINI <0.03 <0.03 0.04* 0.03*   BNP (last 3 results) No results for input(s): PROBNP in the last 8760 hours. HbA1C: No results for input(s): HGBA1C in the last 72 hours. CBG:  Recent Labs Lab 01/28/17 1221 01/28/17 1707 01/28/17 2110 01/29/17 0738 01/29/17 1129  GLUCAP 163* 313* 195* 131* 96   Lipid Profile: No results for input(s): CHOL, HDL, LDLCALC, TRIG, CHOLHDL, LDLDIRECT in the last 72 hours. Thyroid Function Tests: No results for input(s): TSH,  T4TOTAL, FREET4, T3FREE, THYROIDAB in the last 72 hours. Anemia Panel: No results for input(s): VITAMINB12, FOLATE, FERRITIN, TIBC, IRON, RETICCTPCT in the last 72 hours. Urine analysis:    Component Value Date/Time   COLORURINE YELLOW 05/29/2014 1052   APPEARANCEUR TURBID (A) 05/29/2014 1052   LABSPEC 1.016 05/29/2014 1052   PHURINE 5.5 05/29/2014 1052   GLUCOSEU 100 (A) 05/29/2014 1052   HGBUR LARGE (A) 05/29/2014 1052   BILIRUBINUR NEGATIVE 05/29/2014 1052   KETONESUR NEGATIVE 05/29/2014 1052   PROTEINUR 100 (A) 05/29/2014 1052   UROBILINOGEN 1.0 05/29/2014 1052   NITRITE NEGATIVE 05/29/2014 1052   LEUKOCYTESUR LARGE (A) 05/29/2014 1052   Sepsis Labs: @LABRCNTIP (procalcitonin:4,lacticidven:4)  )No results found for this or any previous visit (from the past 240 hour(s)).       Radiology Studies: Dg Chest Port 1 View  Result Date: 01/28/2017 CLINICAL DATA:  Pt c/o SOB x  1 day. Hx of AFIB, CAD, HTN, AND MI. Pt is a former smoker. EXAM: PORTABLE CHEST 1 VIEW COMPARISON:  04/17/2014 FINDINGS: Status post median sternotomy. The heart is mildly enlarged. There is persistent prominence of interstitial markings compatible with interstitial pulmonary edema. No overt alveolar edema. There is mild bibasilar atelectasis. Chronic deformity of the right humerus. IMPRESSION: Persistent interstitial pulmonary edema. Electronically Signed   By: Nolon Nations M.D.   On: 01/28/2017 08:40        Scheduled Meds: . amLODipine  10 mg Oral Daily  . apixaban  5 mg Oral BID  . atorvastatin  80 mg Oral q1800  . furosemide  40 mg Intravenous Q12H  . insulin aspart  0-15 Units Subcutaneous TID WC  . insulin aspart  0-5 Units Subcutaneous QHS  . insulin aspart  3 Units Subcutaneous TID WC  . insulin NPH Human  30 Units Subcutaneous BID AC & HS  . losartan  50 mg Oral Daily  . senna  1 tablet Oral BID  . sodium chloride flush  3 mL Intravenous Q12H   Continuous Infusions: . sodium chloride        LOS: 1 day    Time spent: [place], MD [facility] Pager [phone]  If 7PM-7AM, please contact night-coverage [email or web address] Password [email or web address] 01/29/2017, 2:47 PM

## 2017-01-30 LAB — BASIC METABOLIC PANEL
ANION GAP: 8 (ref 5–15)
BUN: 33 mg/dL — ABNORMAL HIGH (ref 6–20)
CALCIUM: 8.9 mg/dL (ref 8.9–10.3)
CO2: 22 mmol/L (ref 22–32)
Chloride: 107 mmol/L (ref 101–111)
Creatinine, Ser: 1.02 mg/dL — ABNORMAL HIGH (ref 0.44–1.00)
GFR, EST AFRICAN AMERICAN: 59 mL/min — AB (ref >60)
GFR, EST NON AFRICAN AMERICAN: 51 mL/min — AB (ref >60)
Glucose, Bld: 102 mg/dL — ABNORMAL HIGH (ref 65–99)
POTASSIUM: 5 mmol/L (ref 3.5–5.1)
SODIUM: 137 mmol/L (ref 135–145)

## 2017-01-30 LAB — GLUCOSE, CAPILLARY
GLUCOSE-CAPILLARY: 87 mg/dL (ref 65–99)
Glucose-Capillary: 149 mg/dL — ABNORMAL HIGH (ref 65–99)

## 2017-01-30 NOTE — Progress Notes (Signed)
PROGRESS NOTE    Kristina Summers  OVF:[id number] DOB: [birth year] DOA: 01/28/2017 PCP: Rogers Blocker, MD  Outpatient Specialists: Dr. [name], Cardiology     Brief Narrative:  Kristina Summers is a 81 yo F with PMHx significant for HFpEF, CAD s/p CABG, hx CVA, DM, and HTN who presents with sudden onset SOB from sleep.  Woke yesterday morning at 3AM (lives alone in ALF) with severe dyspnea.  Not relieved completely with sitting up, so she called EMS.  Initially hypertensive and CXR showed edema, so she was given nitropaste and Lasix with some symptomatic relief and admitted for CHF flare.   Assessment & Plan:  Principal Problem:   Acute exacerbation of Diastolic CHF (congestive heart failure) /EF 60 % Active Problems:   CAD (coronary artery disease)   Paroxysmal atrial fibrillation   Diabetes mellitus type 2 with peripheral artery disease (HCC)   S/P CABG (coronary artery bypass graft)   Cerebral infarction (HCC)   Acute on chronic diastolic CHF ? Diastolic CHF vs progression of AV disease.  Vmax 1.56 in 2016, now 1.1, gradient 30 in 2016, now 35.   -Continue furosemide 40 mg IV twice a day for now -K supplement -Strict I/Os, daily weights, telemetry  -Daily monitoring renal function -Consult to Cardiology, appreciate recs, in particular, how aggressive to be with diuresis in light of her AV disease   Hypertensive urgency BP improved, still hypertensive -Continue amlodipine, losartan increased -Continue diuresis -Hold metop given bradycardia -Defer further adjustments to cardiology -Hydralazine PRN for hypertension  Troponin elevation Suspect type 2 NSTEMI.  Will defer heparin to Cardiology.  Very low level.  Has peaked. -Consult to Cardiology, appreciate cares  CAD s/p CABG  -Continue statin, ARB  Atrial fibrillation CHADS2Vasc 8.   -Continue apixaban -Hold metoprolol given bradycardia  Diabetes -Continue home NPH -Continue mealtime insulin -SSI  Bradycardia Brief,  asymptomatic. No further episodes. -Hold metoprolol -Monitor on tele      DVT prophylaxis: N/A Code Status: FULL Family Communication: None present Disposition Plan: Further diuresis, managing blood pressure.  Likely home in 1-2 days   Consultants:   Cardiology  Procedures:   Echocardiogram Study Conclusions  - Left ventricle: The cavity size was normal. There was mild   concentric hypertrophy. Systolic function was normal. The   estimated ejection fraction was in the range of 50% to 55%.   Although no diagnostic regional wall motion abnormality was   identified, this possibility cannot be completely excluded on the   basis of this study. - Ventricular septum: Septal motion showed paradox. These changes   are consistent with a left bundle branch block. - Aortic valve: There was moderate stenosis. There was mild to   moderate regurgitation directed centrally in the LVOT. Valve area   (VTI): 1.36 cm^2. Valve area (Vmax): 1.11 cm^2. Valve area   (Vmean): 1.14 cm^2. - Mitral valve: Calcified annulus. Mildly thickened leaflets .   There was mild regurgitation. - Left atrium: The atrium was moderately dilated. - Right atrium: The atrium was moderately dilated. - Pulmonary arteries: PA peak pressure: 32 mm Hg (S).   Subjective: Feeling okay.  Still with O2 on, feels dyspneic without it.  No chest pain.  Otherwise 12 systems reviewed and negative.  Objective: Vitals:   01/29/17 1159 01/29/17 1920 01/30/17 0022 01/30/17 0411  BP: 134/61 (!) 158/57 (!) 163/70 (!) 157/81  Pulse: 60 65 62 77  Resp: 20 20 20 20   Temp: 98.2 F (36.8 C) 98 F (36.7 C)  97.6 F (36.4 C) 98 F (36.7 C)  TempSrc: Oral Oral Oral Oral  SpO2: 93% 97% 98% 100%  Weight:    94.1 kg (207 lb 8 oz)  Height:        Intake/Output Summary (Last 24 hours) at 01/30/17 0823 Last data filed at 01/30/17 0417  Gross per 24 hour  Intake              720 ml  Output             1800 ml  Net             -1080 ml   Filed Weights   01/28/17 1248 01/29/17 0523 01/30/17 0411  Weight: 95 kg (209 lb 8 oz) 95.2 kg (209 lb 14.1 oz) 94.1 kg (207 lb 8 oz)    Examination:  General exam: Appears calm and comfortable  Respiratory system: Clear to auscultation.  Cardiovascular system: S1 & S2 heard, RRR. Loud harsh SEM noted. No JVD, rubs, gallops or clicks. No pedal edema. Gastrointestinal system: Abdomen is nondistended, soft and nontender. No organomegaly or masses felt. Normal bowel sounds heard. Central nervous system: Alert. No focal neurological deficits.   Extremities: Symmetric 5 x 5 power. Skin: No rashes, lesions or ulcers Psychiatry: Judgement and insight appear normal. Mood & affect appropriate.     Data Reviewed: I have personally reviewed following labs and imaging studies  CBC:  Recent Labs Lab 01/28/17 0836 01/29/17 0541  WBC 8.6 8.4  NEUTROABS 6.7  --   HGB 13.9 12.3  HCT 42.0 37.4  MCV 96.6 96.6  PLT 236 811   Basic Metabolic Panel:  Recent Labs Lab 01/28/17 0836 01/29/17 0541 01/30/17 0450  NA 138 136 137  K 4.3 4.1 5.0  CL 108 104 107  CO2 24 24 22   GLUCOSE 113* 131* 102*  BUN 26* 30* 33*  CREATININE 0.97 1.05* 1.02*  CALCIUM 9.2 8.8* 8.9   GFR: Estimated Creatinine Clearance: 51.7 mL/min (A) (by C-G formula based on SCr of 1.02 mg/dL (H)). Liver Function Tests: No results for input(s): AST, ALT, ALKPHOS, BILITOT, PROT, ALBUMIN in the last 168 hours. No results for input(s): LIPASE, AMYLASE in the last 168 hours. No results for input(s): AMMONIA in the last 168 hours. Coagulation Profile: No results for input(s): INR, PROTIME in the last 168 hours. Cardiac Enzymes:  Recent Labs Lab 01/28/17 0836 01/28/17 1618 01/28/17 2130 01/29/17 1002  TROPONINI <0.03 <0.03 0.04* 0.03*   BNP (last 3 results) No results for input(s): PROBNP in the last 8760 hours. HbA1C: No results for input(s): HGBA1C in the last 72 hours. CBG:  Recent Labs Lab  01/29/17 0738 01/29/17 1129 01/29/17 1641 01/29/17 2105 01/30/17 0741  GLUCAP 131* 96 123* 180* 87   Lipid Profile: No results for input(s): CHOL, HDL, LDLCALC, TRIG, CHOLHDL, LDLDIRECT in the last 72 hours. Thyroid Function Tests: No results for input(s): TSH, T4TOTAL, FREET4, T3FREE, THYROIDAB in the last 72 hours. Anemia Panel: No results for input(s): VITAMINB12, FOLATE, FERRITIN, TIBC, IRON, RETICCTPCT in the last 72 hours. Urine analysis:    Component Value Date/Time   COLORURINE YELLOW 05/29/2014 1052   APPEARANCEUR TURBID (A) 05/29/2014 1052   LABSPEC 1.016 05/29/2014 1052   PHURINE 5.5 05/29/2014 1052   GLUCOSEU 100 (A) 05/29/2014 1052   HGBUR LARGE (A) 05/29/2014 1052   BILIRUBINUR NEGATIVE 05/29/2014 1052   KETONESUR NEGATIVE 05/29/2014 1052   PROTEINUR 100 (A) 05/29/2014 1052   UROBILINOGEN 1.0 05/29/2014 1052  NITRITE NEGATIVE 05/29/2014 1052   LEUKOCYTESUR LARGE (A) 05/29/2014 1052   Sepsis Labs: @LABRCNTIP (procalcitonin:4,lacticidven:4)  )No results found for this or any previous visit (from the past 240 hour(s)).       Radiology Studies: Dg Chest Port 1 View  Result Date: 01/28/2017 CLINICAL DATA:  Pt c/o SOB x 1 day. Hx of AFIB, CAD, HTN, AND MI. Pt is a former smoker. EXAM: PORTABLE CHEST 1 VIEW COMPARISON:  04/17/2014 FINDINGS: Status post median sternotomy. The heart is mildly enlarged. There is persistent prominence of interstitial markings compatible with interstitial pulmonary edema. No overt alveolar edema. There is mild bibasilar atelectasis. Chronic deformity of the right humerus. IMPRESSION: Persistent interstitial pulmonary edema. Electronically Signed   By: Nolon Nations M.D.   On: 01/28/2017 08:40        Scheduled Meds: . amLODipine  10 mg Oral Daily  . apixaban  5 mg Oral BID  . atorvastatin  80 mg Oral q1800  . furosemide  40 mg Intravenous BID  . insulin aspart  0-15 Units Subcutaneous TID WC  . insulin aspart  0-5 Units  Subcutaneous QHS  . insulin aspart  3 Units Subcutaneous TID WC  . insulin NPH Human  30 Units Subcutaneous BID AC & HS  . losartan  100 mg Oral Daily  . potassium chloride  40 mEq Oral Daily  . senna  1 tablet Oral BID  . sodium chloride flush  3 mL Intravenous Q12H   Continuous Infusions: . sodium chloride       LOS: 2 days    Time spent: Faulk, MD [facility] Pager [phone]  If 7PM-7AM, please contact night-coverage [email or web address] Password [email or web address] 01/30/2017, 8:23 AM

## 2017-01-30 NOTE — Progress Notes (Signed)
Patient ambulated in hall with walker and minimal assist.  Patient oxygen saturation on room air 95-98% while ambulating.

## 2017-01-30 NOTE — Progress Notes (Signed)
Patient given discharge instructions and all questions answered.  Patient discharged via wheelchair with all belongings.   

## 2017-01-30 NOTE — Progress Notes (Signed)
CSW received a call from pt's provider stating pt needs assistance with transportation.  CSW arrived to the room and pt was on the phone with pt's daughter, per the RN who stated she had canceled work and could pick the pt up.  RN stated pt was no longer in need of transportation.  Please reconsult if future social work needs arise.  CSW signing off, as social work intervention is no longer needed.  Alphonse Guild. Jwan Hornbaker, LCSW, LCAS, CSI Clinical Social Worker Ph: [phone]

## 2017-01-31 NOTE — Discharge Summary (Signed)
Physician Discharge Summary  Kristina Summers TOI:[id number] DOB: [birth year] DOA: 01/28/2017  PCP: Rogers Blocker, MD  Admit date: 01/28/2017 Discharge date: 01/31/2017  Admitted From: Home Disposition:  Home  Recommendations for Outpatient Follow-up:  1. Follow up with primary cardiologist in 1-2 weeks 2. Please obtain BMP in one week   Home Health: No Equipment/Devices: No   Discharge Condition: Good  CODE STATUS: FULL Diet recommendation: Cardiac  Brief/Interim Summary: The patient was in her normal state of health until the morning of admission when she woke with acute dyspnea that did not resolve with sitting up. She presented to the ER where she had acute pulmonary edema, elevated BNP and trace flat troponin elevation.  She was evaluated by Cardiology who recommended echocardiogram and diuresis.  Her echocardiogram showed no change in EF, she was diuresed for two days, felt her breathing was back to baseline and desired discharge home.   Discharge Diagnoses:  Principal Problem:   Acute exacerbation of Diastolic CHF (congestive heart failure) /EF 60 % Active Problems:   CAD (coronary artery disease)   Paroxysmal atrial fibrillation   Diabetes mellitus type 2 with peripheral artery disease (HCC)   S/P CABG (coronary artery bypass graft)   Cerebral infarction [facility])    Discharge Instructions  Discharge Instructions    (HEART FAILURE PATIENTS) Call MD:  Anytime you have any of the following symptoms: 1) 3 pound weight gain in 24 hours or 5 pounds in 1 week 2) shortness of breath, with or without a dry hacking cough 3) swelling in the hands, feet or stomach 4) if you have to sleep on extra pillows at night in order to breathe.    Complete by:  As directed    Diet - low sodium heart healthy    Complete by:  As directed    Discharge instructions    Complete by:  As directed    Resume your home Lasix this afternoon. Restart your home blood pressure medicines.   Call Dr. Thurman Coyer  office Monday morning for a follow up appointment.   Increase activity slowly    Complete by:  As directed      Allergies as of 01/30/2017      Reactions   Morphine And Related Nausea And Vomiting   Chest pain    Lisinopril Cough   Sertraline Hcl    REACTION: nausea   Zoloft [sertraline Hcl] Other (See Comments)   Nausea      Medication List    TAKE these medications   acetaminophen 325 MG tablet Commonly known as:  TYLENOL Take 2 tablets (650 mg total) by mouth every 6 (six) hours as needed for mild pain (or Fever >/= 101).   amLODipine 5 MG tablet Commonly known as:  NORVASC Take 1 tablet by mouth daily.   apixaban 5 MG Tabs tablet Commonly known as:  ELIQUIS Take 1 tablet (5 mg total) by mouth 2 (two) times daily.   atorvastatin 80 MG tablet Commonly known as:  LIPITOR Take 1 tablet (80 mg total) by mouth daily.   furosemide 40 MG tablet Commonly known as:  LASIX Take 1 tablet (40 mg total) by mouth daily. What changed:  when to take this   HUMULIN R 100 units/mL injection Generic drug:  insulin regular inject 5 units subcutaneously BEFORE BREAKFAST AND DINNER   insulin NPH Human 100 UNIT/ML injection Commonly known as:  HUMULIN N,NOVOLIN N Inject 0.38 mLs (38 Units total) into the skin 2 (two) times daily. [place]  in am and 34 at night What changed:  how much to take  when to take this  additional instructions   losartan 50 MG tablet Commonly known as:  COZAAR Take 1 tablet (50 mg total) by mouth daily. What changed:  when to take this   metoprolol tartrate 25 MG tablet Commonly known as:  LOPRESSOR Take 1 tablet by mouth 2 (two) times daily.   NITROSTAT 0.4 MG SL tablet Generic drug:  nitroGLYCERIN Place 1 tablet under the tongue every 5 (five) minutes x 3 doses as needed. Chest pain   ONE-A-DAY 50 PLUS PO Take 1 tablet by mouth daily.       Allergies  Allergen Reactions  . Morphine And Related Nausea And Vomiting    Chest pain    .  Lisinopril Cough  . Sertraline Hcl     REACTION: nausea  . Zoloft [Sertraline Hcl] Other (See Comments)    Nausea     Consultations:  Cardiology   Procedures/Studies: Dg Chest Port 1 View  Result Date: 01/28/2017 CLINICAL DATA:  Pt c/o SOB x 1 day. Hx of AFIB, CAD, HTN, AND MI. Pt is a former smoker. EXAM: PORTABLE CHEST 1 VIEW COMPARISON:  04/17/2014 FINDINGS: Status post median sternotomy. The heart is mildly enlarged. There is persistent prominence of interstitial markings compatible with interstitial pulmonary edema. No overt alveolar edema. There is mild bibasilar atelectasis. Chronic deformity of the right humerus. IMPRESSION: Persistent interstitial pulmonary edema. Electronically Signed   By: Nolon Nations M.D.   On: 01/28/2017 08:40       Subjective: Feels back to baseline.  No chest pain, no dyspnea with exertion, no orthopnea or leg swelling.  Discharge Exam: Vitals:   01/30/17 1241 01/30/17 1501  BP: (!) 176/64 (!) 157/80  Pulse:  85  Resp:    Temp:    SpO2:     Vitals:   01/30/17 0411 01/30/17 1235 01/30/17 1241 01/30/17 1501  BP: (!) 157/81 (!) 194/73 (!) 176/64 (!) 157/80  Pulse: 77 85  85  Resp: 20 20    Temp: 98 F (36.7 C) 98.3 F (36.8 C)    TempSrc: Oral Oral    SpO2: 100% 93%    Weight: 94.1 kg (207 lb 8 oz)     Height:        General: Pt is alert, awake, not in acute distress Cardiovascular: RRR, S1/S2 +, no rubs, no gallops Respiratory: CTA bilaterally, no wheezing, no rhonchi Abdominal: Soft, NT, ND, bowel sounds + Extremities: no edema, no cyanosis    The results of significant diagnostics from this hospitalization (including imaging, microbiology, ancillary and laboratory) are listed below for reference.     Microbiology: No results found for this or any previous visit (from the past 240 hour(s)).   Labs: BNP (last 3 results)  Recent Labs  01/28/17 0836  BNP 382.5*   Basic Metabolic Panel:  Recent Labs Lab  01/28/17 0836 01/29/17 0541 01/30/17 0450  NA 138 136 137  K 4.3 4.1 5.0  CL 108 104 107  CO2 24 24 22   GLUCOSE 113* 131* 102*  BUN 26* 30* 33*  CREATININE 0.97 1.05* 1.02*  CALCIUM 9.2 8.8* 8.9   Liver Function Tests: No results for input(s): AST, ALT, ALKPHOS, BILITOT, PROT, ALBUMIN in the last 168 hours. No results for input(s): LIPASE, AMYLASE in the last 168 hours. No results for input(s): AMMONIA in the last 168 hours. CBC:  Recent Labs Lab 01/28/17 0836 01/29/17 0541  WBC 8.6 8.4  NEUTROABS 6.7  --   HGB 13.9 12.3  HCT 42.0 37.4  MCV 96.6 96.6  PLT 236 224   Cardiac Enzymes:  Recent Labs Lab 01/28/17 0836 01/28/17 1618 01/28/17 2130 01/29/17 1002  TROPONINI <0.03 <0.03 0.04* 0.03*   BNP: Invalid input(s): POCBNP CBG:  Recent Labs Lab 01/29/17 1129 01/29/17 1641 01/29/17 2105 01/30/17 0741 01/30/17 1136  GLUCAP 96 123* 180* 87 149*   D-Dimer No results for input(s): DDIMER in the last 72 hours. Hgb A1c No results for input(s): HGBA1C in the last 72 hours. Lipid Profile No results for input(s): CHOL, HDL, LDLCALC, TRIG, CHOLHDL, LDLDIRECT in the last 72 hours. Thyroid function studies No results for input(s): TSH, T4TOTAL, T3FREE, THYROIDAB in the last 72 hours.  Invalid input(s): FREET3 Anemia work up No results for input(s): VITAMINB12, FOLATE, FERRITIN, TIBC, IRON, RETICCTPCT in the last 72 hours. Urinalysis    Component Value Date/Time   COLORURINE YELLOW 05/29/2014 1052   APPEARANCEUR TURBID (A) 05/29/2014 1052   LABSPEC 1.016 05/29/2014 1052   PHURINE 5.5 05/29/2014 1052   GLUCOSEU 100 (A) 05/29/2014 1052   HGBUR LARGE (A) 05/29/2014 1052   BILIRUBINUR NEGATIVE 05/29/2014 1052   KETONESUR NEGATIVE 05/29/2014 1052   PROTEINUR 100 (A) 05/29/2014 1052   UROBILINOGEN 1.0 05/29/2014 1052   NITRITE NEGATIVE 05/29/2014 1052   LEUKOCYTESUR LARGE (A) 05/29/2014 1052   Sepsis Labs Invalid input(s): PROCALCITONIN,  WBC,   LACTICIDVEN Microbiology No results found for this or any previous visit (from the past 240 hour(s)).   Time coordinating discharge: Over 30 minutes  SIGNED:   Edwin Dada, MD  [facility] 01/31/2017, 5:04 PM   If 7PM-7AM, please contact night-coverage [email or web address] Password [email or web address]

## 2017-02-16 NOTE — Progress Notes (Signed)
Personally  participated in, made any corrections needed, and agree with history, physical, neuro exam,assessment and plan as stated above.    Glendon Fiser, MD [facility] 

## 2017-03-15 ENCOUNTER — Ambulatory Visit (INDEPENDENT_AMBULATORY_CARE_PROVIDER_SITE_OTHER): Payer: Medicare Other | Admitting: Sports Medicine

## 2017-03-15 ENCOUNTER — Encounter: Payer: Self-pay | Admitting: Sports Medicine

## 2017-03-15 DIAGNOSIS — M79676 Pain in unspecified toe(s): Secondary | ICD-10-CM | POA: Diagnosis not present

## 2017-03-15 DIAGNOSIS — B351 Tinea unguium: Secondary | ICD-10-CM

## 2017-03-15 DIAGNOSIS — E1151 Type 2 diabetes mellitus with diabetic peripheral angiopathy without gangrene: Secondary | ICD-10-CM | POA: Diagnosis not present

## 2017-03-15 DIAGNOSIS — L97511 Non-pressure chronic ulcer of other part of right foot limited to breakdown of skin: Secondary | ICD-10-CM | POA: Diagnosis not present

## 2017-03-15 DIAGNOSIS — Q828 Other specified congenital malformations of skin: Secondary | ICD-10-CM

## 2017-03-15 MED ORDER — MUPIROCIN 2 % EX OINT: TOPICAL_OINTMENT | CUTANEOUS | 0 refills | Status: DC

## 2017-03-15 NOTE — Progress Notes (Signed)
Patient ID: Kristina Summers, female   DOB: [birth year], 81 y.o.   MRN: [id number] Subjective: Kristina Summers is a 81 y.o. female patient with history of diabetes who presents to office today complaining of long, painful nails and callus while ambulating in shoes; unable to trim. Patient states that the glucose reading this morning was not taken last A1c 7. Patient also states that on the bottom of her right foot there has been a painful area that has been draining x 5 weeks that she has been treating with neosporin. Denies constitutional symptoms. Patient denies any new changes in medication or new problems.   Patient Active Problem List   Diagnosis Date Noted  . Acute exacerbation of Diastolic CHF (congestive heart failure) /EF 60 % 01/28/2017  . Cerebral infarction ([place]) 08-02-2014  . Cerebral embolism with cerebral infarction ([place]) 08-02-2014  . Fall   . Stroke ([employer])   . UTI (lower urinary tract infection) 05/29/2014  . Fracture of distal end of right fibula 05/29/2014  . Rhabdomyolysis 05/29/2014  . Transaminitis 05/29/2014  . Weakness of both lower extremities 01/11/2014  . Pain of left lower extremity- and Right 01/11/2014  . Aftercare following surgery of the circulatory system, [place] 01/11/2014  . Peripheral vascular disease, unspecified ([place]) 01/11/2014  . Cellulitis of great toe, right 08/23/2013  . CAD (coronary artery disease)   . Hypertensive heart disease without CHF   . Paroxysmal atrial fibrillation   . Diabetes mellitus type 2 with peripheral artery disease ([place])   . Obesity (BMI 30-39.9)   . Hyperlipidemia   . Atherosclerosis of native arteries of extremity with intermittent claudication ([place])   . History of GI bleed 04/21/2009  . S/P CABG (coronary artery bypass graft) 11/20/1998   Current Outpatient Medications on File Prior to Visit  Medication Sig Dispense Refill  . acetaminophen (TYLENOL) 325 MG tablet Take 2 tablets (650 mg total) by mouth every 6 (six) hours as needed  for mild pain (or Fever >/= 101). 60 tablet 1  . amLODipine (NORVASC) 5 MG tablet Take 1 tablet by mouth daily.    Marland Kitchen apixaban (ELIQUIS) 5 MG TABS tablet Take 1 tablet (5 mg total) by mouth 2 (two) times daily. 60 tablet 0  . atorvastatin (LIPITOR) 80 MG tablet Take 1 tablet (80 mg total) by mouth daily. 30 tablet 0  . furosemide (LASIX) 40 MG tablet Take 1 tablet (40 mg total) by mouth daily. (Patient taking differently: Take 40 mg by mouth 2 (two) times daily. ) 30 tablet   . HUMULIN R 100 UNIT/ML injection inject 5 units subcutaneously BEFORE BREAKFAST AND DINNER  0  . insulin NPH Human (HUMULIN N,NOVOLIN N) 100 UNIT/ML injection Inject 0.38 mLs (38 Units total) into the skin 2 (two) times daily. 38 in am and 34 at night (Patient taking differently: Inject 37 Units into the skin daily before breakfast. 37 in am and 30 at night,  Takes Humulin R 5Units am and pm) 10 mL 11  . losartan (COZAAR) 50 MG tablet Take 1 tablet (50 mg total) by mouth daily. (Patient taking differently: Take 50 mg by mouth 2 (two) times daily. )    . metoprolol tartrate (LOPRESSOR) 25 MG tablet Take 1 tablet by mouth 2 (two) times daily.    . Multiple Vitamins-Minerals (ONE-A-DAY 50 PLUS PO) Take 1 tablet by mouth daily.    Marland Kitchen NITROSTAT 0.4 MG SL tablet Place 1 tablet under the tongue every 5 (five) minutes x 3 doses as needed. Chest  pain  1   No current facility-administered medications on file prior to visit.    Allergies  Allergen Reactions  . Morphine And Related Nausea And Vomiting    Chest pain    . Lisinopril Cough  . Sertraline Hcl     REACTION: nausea  . Zoloft [Sertraline Hcl] Other (See Comments)    Nausea     Recent Results (from the past 2160 hour(s))  Troponin I     Status: None   Collection Time: 01/28/17  8:36 AM  Result Value Ref Range   Troponin I <0.03 <0.03 ng/mL  Basic metabolic panel     Status: Abnormal   Collection Time: 01/28/17  8:36 AM  Result Value Ref Range   Sodium 138 135 - 145  mmol/L   Potassium 4.3 3.5 - 5.1 mmol/L   Chloride 108 101 - 111 mmol/L   CO2 24 22 - 32 mmol/L   Glucose, Bld 113 (H) 65 - 99 mg/dL   BUN 26 (H) 6 - 20 mg/dL   Creatinine, Ser 0.97 0.44 - 1.00 mg/dL   Calcium 9.2 8.9 - 10.3 mg/dL   GFR calc non Af Amer 54 (L) >60 mL/min   GFR calc Af Amer >60 >60 mL/min    Comment: (NOTE) The eGFR has been calculated using the CKD EPI equation. This calculation has not been validated in all clinical situations. eGFR's persistently <60 mL/min signify possible Chronic Kidney Disease.    Anion gap 6 5 - 15  CBC with Differential/Platelet     Status: None   Collection Time: 01/28/17  8:36 AM  Result Value Ref Range   WBC 8.6 4.0 - 10.5 K/uL   RBC 4.35 3.87 - 5.11 MIL/uL   Hemoglobin 13.9 12.0 - 15.0 g/dL   HCT 42.0 36.0 - 46.0 %   MCV 96.6 78.0 - 100.0 fL   MCH 32.0 26.0 - 34.0 pg   MCHC 33.1 30.0 - 36.0 g/dL   RDW 13.4 11.5 - 15.5 %   Platelets 236 150 - 400 K/uL   Neutrophils Relative % 77 %   Neutro Abs 6.7 1.7 - 7.7 K/uL   Lymphocytes Relative 10 %   Lymphs Abs 0.9 0.7 - 4.0 K/uL   Monocytes Relative 9 %   Monocytes Absolute 0.7 0.1 - 1.0 K/uL   Eosinophils Relative 3 %   Eosinophils Absolute 0.3 0.0 - 0.7 K/uL   Basophils Relative 1 %   Basophils Absolute 0.1 0.0 - 0.1 K/uL  Brain natriuretic peptide     Status: Abnormal   Collection Time: 01/28/17  8:36 AM  Result Value Ref Range   B Natriuretic Peptide 686.0 (H) 0.0 - 100.0 pg/mL  CBG monitoring, ED     Status: Abnormal   Collection Time: 01/28/17 12:21 PM  Result Value Ref Range   Glucose-Capillary 163 (H) 65 - 99 mg/dL  ECHOCARDIOGRAM COMPLETE     Status: None   Collection Time: 01/28/17  3:22 PM  Result Value Ref Range   Weight 3,352 oz   Height 66 in   BP 164/84 mmHg  Troponin I (q 6hr x 3)     Status: None   Collection Time: 01/28/17  4:18 PM  Result Value Ref Range   Troponin I <0.03 <0.03 ng/mL  Glucose, capillary     Status: Abnormal   Collection Time: 01/28/17   5:07 PM  Result Value Ref Range   Glucose-Capillary 313 (H) 65 - 99 mg/dL   Comment 1 Notify  RN   Glucose, capillary     Status: Abnormal   Collection Time: 01/28/17  9:10 PM  Result Value Ref Range   Glucose-Capillary 195 (H) 65 - 99 mg/dL  Troponin I (q 6hr x 3)     Status: Abnormal   Collection Time: 01/28/17  9:30 PM  Result Value Ref Range   Troponin I 0.04 (HH) <0.03 ng/mL    Comment: CRITICAL RESULT CALLED TO, READ BACK BY AND VERIFIED WITH: [facility] 01/28/17 2320 WAYK   Basic metabolic panel     Status: Abnormal   Collection Time: 01/29/17  5:41 AM  Result Value Ref Range   Sodium 136 135 - 145 mmol/L   Potassium 4.1 3.5 - 5.1 mmol/L   Chloride 104 101 - 111 mmol/L   CO2 24 22 - 32 mmol/L   Glucose, Bld 131 (H) 65 - 99 mg/dL   BUN 30 (H) 6 - 20 mg/dL   Creatinine, Ser 1.05 (H) 0.44 - 1.00 mg/dL   Calcium 8.8 (L) 8.9 - 10.3 mg/dL   GFR calc non Af Amer 49 (L) >60 mL/min   GFR calc Af Amer 57 (L) >60 mL/min    Comment: (NOTE) The eGFR has been calculated using the CKD EPI equation. This calculation has not been validated in all clinical situations. eGFR's persistently <60 mL/min signify possible Chronic Kidney Disease.    Anion gap 8 5 - 15  CBC     Status: None   Collection Time: 01/29/17  5:41 AM  Result Value Ref Range   WBC 8.4 4.0 - 10.5 K/uL   RBC 3.87 3.87 - 5.11 MIL/uL   Hemoglobin 12.3 12.0 - 15.0 g/dL   HCT 37.4 36.0 - 46.0 %   MCV 96.6 78.0 - 100.0 fL   MCH 31.8 26.0 - 34.0 pg   MCHC 32.9 30.0 - 36.0 g/dL   RDW 13.4 11.5 - 15.5 %   Platelets 224 150 - 400 K/uL  Glucose, capillary     Status: Abnormal   Collection Time: 01/29/17  7:38 AM  Result Value Ref Range   Glucose-Capillary 131 (H) 65 - 99 mg/dL  Troponin I     Status: Abnormal   Collection Time: 01/29/17 10:02 AM  Result Value Ref Range   Troponin I 0.03 (HH) <0.03 ng/mL    Comment: CRITICAL VALUE NOTED.  VALUE IS CONSISTENT WITH PREVIOUSLY REPORTED AND CALLED VALUE.  Glucose,  capillary     Status: None   Collection Time: 01/29/17 11:29 AM  Result Value Ref Range   Glucose-Capillary 96 65 - 99 mg/dL   Comment 1 Notify RN   Glucose, capillary     Status: Abnormal   Collection Time: 01/29/17  4:41 PM  Result Value Ref Range   Glucose-Capillary 123 (H) 65 - 99 mg/dL   Comment 1 Notify RN   Glucose, capillary     Status: Abnormal   Collection Time: 01/29/17  9:05 PM  Result Value Ref Range   Glucose-Capillary 180 (H) 65 - 99 mg/dL   Comment 1 Notify RN    Comment 2 Document in Chart   Basic metabolic panel     Status: Abnormal   Collection Time: 01/30/17  4:50 AM  Result Value Ref Range   Sodium 137 135 - 145 mmol/L   Potassium 5.0 3.5 - 5.1 mmol/L    Comment: DELTA CHECK NOTED SPECIMEN HEMOLYZED. HEMOLYSIS MAY AFFECT INTEGRITY OF RESULTS.    Chloride 107 101 - 111 mmol/L  CO2 22 22 - 32 mmol/L   Glucose, Bld 102 (H) 65 - 99 mg/dL   BUN 33 (H) 6 - 20 mg/dL   Creatinine, Ser 1.02 (H) 0.44 - 1.00 mg/dL   Calcium 8.9 8.9 - 10.3 mg/dL   GFR calc non Af Amer 51 (L) >60 mL/min   GFR calc Af Amer 59 (L) >60 mL/min    Comment: (NOTE) The eGFR has been calculated using the CKD EPI equation. This calculation has not been validated in all clinical situations. eGFR's persistently <60 mL/min signify possible Chronic Kidney Disease.    Anion gap 8 5 - 15  Glucose, capillary     Status: None   Collection Time: 01/30/17  7:41 AM  Result Value Ref Range   Glucose-Capillary 87 65 - 99 mg/dL  Glucose, capillary     Status: Abnormal   Collection Time: 01/30/17 11:36 AM  Result Value Ref Range   Glucose-Capillary 149 (H) 65 - 99 mg/dL   Comment 1 Notify RN     Objective: General: Patient is awake, alert, and oriented x 3 and in no acute distress.  Integument: Skin is warm, dry and supple bilateral. Nails are tender, long, thickened and  dystrophic with subungual debris, consistent with onychomycosis, 1-5 bilateral. No signs of infection. Mild callus sub met 5  on left without infection. + Partial thickness ulceration sub met 5 on right measures 0.4x0.4x0.1cm with granular base with no signs of infection. Remaining integument unremarkable.  Vasculature:  Dorsalis Pedis pulse 0/4 bilateral. Posterior Tibial pulse  0/4 bilateral. No ischemia or gangrene. Capillary fill time <5 sec 1-5 bilateral. No hair growth to the level of the digits. Temperature gradient within normal limits. No varicosities present bilateral. No edema present bilateral.   Neurology: The patient has diminished sensation measured with a 5.07/10g Semmes Weinstein Monofilament at all pedal sites bilateral. Vibratory sensation absent bilateral with tuning fork. No Babinski sign present bilateral.   Musculoskeletal: Mild tenderness to ulcer site. Asymptomatic hammertoe and pes planus pedal deformities noted bilateral. Muscular strength 4/5 in all lower extremity muscular groups bilateral without pain on range of motion . No tenderness with calf compression bilateral.  Assessment and Plan: Problem List Items Addressed This Visit    None    Visit Diagnoses    Foot ulcer, limited to breakdown of skin, right (HCC)    -  Primary   Relevant Medications   mupirocin ointment (BACTROBAN) 2 %   Porokeratosis       Pain due to onychomycosis of toenail       Relevant Medications   mupirocin ointment (BACTROBAN) 2 %   Diabetic peripheral vascular disease ([place])          -Examined patient. -Discussed and educated patient on diabetic foot care, especially with regards to the vascular, neurological and musculoskeletal systems.  -Stressed the importance of good glycemic control and the detriment of not controlling glucose levels in relation to the foot. -Mechanically debrided all nails 1-5 bilateral using sterile nail nipper and filed with dremel without incident  -Callus x1 on left minimal reduced with sterile chisel blade bilateral without incident  -Excisionally dedbrided ulceration at Sub met  5 on Right to healthy bleeding borders removing nonviable tissue using a sterile chisel blade. Wound measures post debridement as above. Wound was debrided to the level of the dermis with viable wound base exposed to promote healing. Hemostasis was achieved with manuel pressure. Patient tolerated procedure well without any discomfort or anesthesia necessary for  this wound debridement.  -Applied silvadene and dry sterile dressing and instructed patient to continue with daily dressings at home consisting of bactroban and bandaid/dry sterile dressing. - Advised patient to go to the ER or return to office if the wound worsens or if constitutional symptoms are present. -Continue with diabetic shoes  -Answered all patient questions -Patient to return  in 1 month for right foot wound check -Patient advised to call the office if any problems or questions arise in the meantime.  Landis Martins, DPM

## 2017-04-26 ENCOUNTER — Encounter: Payer: Self-pay | Admitting: Sports Medicine

## 2017-04-26 ENCOUNTER — Ambulatory Visit (INDEPENDENT_AMBULATORY_CARE_PROVIDER_SITE_OTHER): Payer: Medicare Other | Admitting: Sports Medicine

## 2017-04-26 VITALS — BP 157/77 | HR 69

## 2017-04-26 DIAGNOSIS — L97511 Non-pressure chronic ulcer of other part of right foot limited to breakdown of skin: Secondary | ICD-10-CM | POA: Diagnosis not present

## 2017-04-26 DIAGNOSIS — B351 Tinea unguium: Secondary | ICD-10-CM | POA: Diagnosis not present

## 2017-04-26 DIAGNOSIS — E1151 Type 2 diabetes mellitus with diabetic peripheral angiopathy without gangrene: Secondary | ICD-10-CM | POA: Diagnosis not present

## 2017-04-26 DIAGNOSIS — M79676 Pain in unspecified toe(s): Secondary | ICD-10-CM | POA: Diagnosis not present

## 2017-04-26 NOTE — Progress Notes (Signed)
Patient ID: Kristina Summers, female   DOB: 1936/02/26, 82 y.o.   MRN: [id number] Subjective: Kristina Summers is a 82 y.o. female patient with history of diabetes who presents to office today complaining of long, painful nails while ambulating in shoes; unable to trim. Patient states that the glucose reading this morning was 69 and last A1c 7. Patient is also here for wound check on right foot. States that she hasn't been putting anything on it. Denies constitutional symptoms. Patient denies any new changes in medication or new problems.   Patient Active Problem List   Diagnosis Date Noted  . Acute exacerbation of Diastolic CHF (congestive heart failure) /EF 60 % 01/28/2017  . Cerebral infarction ([place]) 2020-08-1214  . Cerebral embolism with cerebral infarction ([place]) 2020-08-1214  . Fall   . Stroke ([place])   . UTI (lower urinary tract infection) 05/29/2014  . Fracture of distal end of right fibula 05/29/2014  . Rhabdomyolysis 05/29/2014  . Transaminitis 05/29/2014  . Weakness of both lower extremities 01/11/2014  . Pain of left lower extremity- and Right 01/11/2014  . Aftercare following surgery of the circulatory system, [place] 01/11/2014  . Peripheral vascular disease, unspecified ([place]) 01/11/2014  . Cellulitis of great toe, right 08/23/2013  . CAD (coronary artery disease)   . Hypertensive heart disease without CHF   . Paroxysmal atrial fibrillation   . Diabetes mellitus type 2 with peripheral artery disease ([place])   . Obesity (BMI 30-39.9)   . Hyperlipidemia   . Atherosclerosis of native arteries of extremity with intermittent claudication ([place])   . History of GI bleed 04/21/2009  . S/P CABG (coronary artery bypass graft) 11/20/1998   Current Outpatient Medications on File Prior to Visit  Medication Sig Dispense Refill  . acetaminophen (TYLENOL) 325 MG tablet Take 2 tablets (650 mg total) by mouth every 6 (six) hours as needed for mild pain (or Fever >/= 101). 60 tablet 1  . amLODipine (NORVASC) 5 MG  tablet Take 1 tablet by mouth daily.    Marland Kitchen apixaban (ELIQUIS) 5 MG TABS tablet Take 1 tablet (5 mg total) by mouth 2 (two) times daily. 60 tablet 0  . atorvastatin (LIPITOR) 80 MG tablet Take 1 tablet (80 mg total) by mouth daily. 30 tablet 0  . furosemide (LASIX) 40 MG tablet Take 1 tablet (40 mg total) by mouth daily. (Patient taking differently: Take 40 mg by mouth 2 (two) times daily. ) 30 tablet   . HUMULIN R 100 UNIT/ML injection inject 5 units subcutaneously BEFORE BREAKFAST AND DINNER  0  . insulin NPH Human (HUMULIN N,NOVOLIN N) 100 UNIT/ML injection Inject 0.38 mLs (38 Units total) into the skin 2 (two) times daily. 38 in am and 34 at night (Patient taking differently: Inject 37 Units into the skin daily before breakfast. 37 in am and 30 at night,  Takes Humulin R 5Units am and pm) 10 mL 11  . losartan (COZAAR) 50 MG tablet Take 1 tablet (50 mg total) by mouth daily. (Patient taking differently: Take 50 mg by mouth 2 (two) times daily. )    . metoprolol tartrate (LOPRESSOR) 25 MG tablet Take 1 tablet by mouth 2 (two) times daily.    . Multiple Vitamins-Minerals (ONE-A-DAY 50 PLUS PO) Take 1 tablet by mouth daily.    . mupirocin ointment (BACTROBAN) 2 % Apply daily to right foot ulceration 30 g 0  . NITROSTAT 0.4 MG SL tablet Place 1 tablet under the tongue every 5 (five) minutes x 3 doses as  needed. Chest pain  1   No current facility-administered medications on file prior to visit.    Allergies  Allergen Reactions  . Morphine And Related Nausea And Vomiting    Chest pain    . Lisinopril Cough  . Sertraline Hcl     REACTION: nausea  . Zoloft [Sertraline Hcl] Other (See Comments)    Nausea     Recent Results (from the past 2160 hour(s))  Troponin I     Status: None   Collection Time: 01/28/17  8:36 AM  Result Value Ref Range   Troponin I <0.03 <0.03 ng/mL  Basic metabolic panel     Status: Abnormal   Collection Time: 01/28/17  8:36 AM  Result Value Ref Range   Sodium 138  135 - 145 mmol/L   Potassium 4.3 3.5 - 5.1 mmol/L   Chloride 108 101 - 111 mmol/L   CO2 24 22 - 32 mmol/L   Glucose, Bld 113 (H) 65 - 99 mg/dL   BUN 26 (H) 6 - 20 mg/dL   Creatinine, Ser 0.97 0.44 - 1.00 mg/dL   Calcium 9.2 8.9 - 10.3 mg/dL   GFR calc non Af Amer 54 (L) >60 mL/min   GFR calc Af Amer >60 >60 mL/min    Comment: (NOTE) The eGFR has been calculated using the CKD EPI equation. This calculation has not been validated in all clinical situations. eGFR's persistently <60 mL/min signify possible Chronic Kidney Disease.    Anion gap 6 5 - 15  CBC with Differential/Platelet     Status: None   Collection Time: 01/28/17  8:36 AM  Result Value Ref Range   WBC 8.6 4.0 - 10.5 K/uL   RBC 4.35 3.87 - 5.11 MIL/uL   Hemoglobin 13.9 12.0 - 15.0 g/dL   HCT 42.0 36.0 - 46.0 %   MCV 96.6 78.0 - 100.0 fL   MCH 32.0 26.0 - 34.0 pg   MCHC 33.1 30.0 - 36.0 g/dL   RDW 13.4 11.5 - 15.5 %   Platelets 236 150 - 400 K/uL   Neutrophils Relative % 77 %   Neutro Abs 6.7 1.7 - 7.7 K/uL   Lymphocytes Relative 10 %   Lymphs Abs 0.9 0.7 - 4.0 K/uL   Monocytes Relative 9 %   Monocytes Absolute 0.7 0.1 - 1.0 K/uL   Eosinophils Relative 3 %   Eosinophils Absolute 0.3 0.0 - 0.7 K/uL   Basophils Relative 1 %   Basophils Absolute 0.1 0.0 - 0.1 K/uL  Brain natriuretic peptide     Status: Abnormal   Collection Time: 01/28/17  8:36 AM  Result Value Ref Range   B Natriuretic Peptide 686.0 (H) 0.0 - 100.0 pg/mL  CBG monitoring, ED     Status: Abnormal   Collection Time: 01/28/17 12:21 PM  Result Value Ref Range   Glucose-Capillary 163 (H) 65 - 99 mg/dL  ECHOCARDIOGRAM COMPLETE     Status: None   Collection Time: 01/28/17  3:22 PM  Result Value Ref Range   Weight 3,352 oz   Height 66 in   BP 164/84 mmHg  Troponin I (q 6hr x 3)     Status: None   Collection Time: 01/28/17  4:18 PM  Result Value Ref Range   Troponin I <0.03 <0.03 ng/mL  Glucose, capillary     Status: Abnormal   Collection Time:  01/28/17  5:07 PM  Result Value Ref Range   Glucose-Capillary 313 (H) 65 - 99 mg/dL   Comment  1 Notify RN   Glucose, capillary     Status: Abnormal   Collection Time: 01/28/17  9:10 PM  Result Value Ref Range   Glucose-Capillary 195 (H) 65 - 99 mg/dL  Troponin I (q 6hr x 3)     Status: Abnormal   Collection Time: 01/28/17  9:30 PM  Result Value Ref Range   Troponin I 0.04 (HH) <0.03 ng/mL    Comment: CRITICAL RESULT CALLED TO, READ BACK BY AND VERIFIED WITH: [facility] 01/28/17 2320 WAYK   Basic metabolic panel     Status: Abnormal   Collection Time: 01/29/17  5:41 AM  Result Value Ref Range   Sodium 136 135 - 145 mmol/L   Potassium 4.1 3.5 - 5.1 mmol/L   Chloride 104 101 - 111 mmol/L   CO2 24 22 - 32 mmol/L   Glucose, Bld 131 (H) 65 - 99 mg/dL   BUN 30 (H) 6 - 20 mg/dL   Creatinine, Ser 1.05 (H) 0.44 - 1.00 mg/dL   Calcium 8.8 (L) 8.9 - 10.3 mg/dL   GFR calc non Af Amer 49 (L) >60 mL/min   GFR calc Af Amer 57 (L) >60 mL/min    Comment: (NOTE) The eGFR has been calculated using the CKD EPI equation. This calculation has not been validated in all clinical situations. eGFR's persistently <60 mL/min signify possible Chronic Kidney Disease.    Anion gap 8 5 - 15  CBC     Status: None   Collection Time: 01/29/17  5:41 AM  Result Value Ref Range   WBC 8.4 4.0 - 10.5 K/uL   RBC 3.87 3.87 - 5.11 MIL/uL   Hemoglobin 12.3 12.0 - 15.0 g/dL   HCT 37.4 36.0 - 46.0 %   MCV 96.6 78.0 - 100.0 fL   MCH 31.8 26.0 - 34.0 pg   MCHC 32.9 30.0 - 36.0 g/dL   RDW 13.4 11.5 - 15.5 %   Platelets 224 150 - 400 K/uL  Glucose, capillary     Status: Abnormal   Collection Time: 01/29/17  7:38 AM  Result Value Ref Range   Glucose-Capillary 131 (H) 65 - 99 mg/dL  Troponin I     Status: Abnormal   Collection Time: 01/29/17 10:02 AM  Result Value Ref Range   Troponin I 0.03 (HH) <0.03 ng/mL    Comment: CRITICAL VALUE NOTED.  VALUE IS CONSISTENT WITH PREVIOUSLY REPORTED AND CALLED VALUE.   Glucose, capillary     Status: None   Collection Time: 01/29/17 11:29 AM  Result Value Ref Range   Glucose-Capillary 96 65 - 99 mg/dL   Comment 1 Notify RN   Glucose, capillary     Status: Abnormal   Collection Time: 01/29/17  4:41 PM  Result Value Ref Range   Glucose-Capillary 123 (H) 65 - 99 mg/dL   Comment 1 Notify RN   Glucose, capillary     Status: Abnormal   Collection Time: 01/29/17  9:05 PM  Result Value Ref Range   Glucose-Capillary 180 (H) 65 - 99 mg/dL   Comment 1 Notify RN    Comment 2 Document in Chart   Basic metabolic panel     Status: Abnormal   Collection Time: 01/30/17  4:50 AM  Result Value Ref Range   Sodium 137 135 - 145 mmol/L   Potassium 5.0 3.5 - 5.1 mmol/L    Comment: DELTA CHECK NOTED SPECIMEN HEMOLYZED. HEMOLYSIS MAY AFFECT INTEGRITY OF RESULTS.    Chloride 107 101 - 111 mmol/L  CO2 22 22 - 32 mmol/L   Glucose, Bld 102 (H) 65 - 99 mg/dL   BUN 33 (H) 6 - 20 mg/dL   Creatinine, Ser 1.02 (H) 0.44 - 1.00 mg/dL   Calcium 8.9 8.9 - 10.3 mg/dL   GFR calc non Af Amer 51 (L) >60 mL/min   GFR calc Af Amer 59 (L) >60 mL/min    Comment: (NOTE) The eGFR has been calculated using the CKD EPI equation. This calculation has not been validated in all clinical situations. eGFR's persistently <60 mL/min signify possible Chronic Kidney Disease.    Anion gap 8 5 - 15  Glucose, capillary     Status: None   Collection Time: 01/30/17  7:41 AM  Result Value Ref Range   Glucose-Capillary 87 65 - 99 mg/dL  Glucose, capillary     Status: Abnormal   Collection Time: 01/30/17 11:36 AM  Result Value Ref Range   Glucose-Capillary 149 (H) 65 - 99 mg/dL   Comment 1 Notify RN     Objective: General: Patient is awake, alert, and oriented x 3 and in no acute distress.  Integument: Skin is warm, dry and supple bilateral. Nails are tender, long, thickened and  dystrophic with subungual debris, consistent with onychomycosis, 1-5 bilateral. No signs of infection. Mild  callus sub met 5 on left without infection. + Partial thickness ulceration sub met 5 on right measures 0.2x0.2x0.1cm with evidence of scab and granular base with no signs of infection. Remaining integument unremarkable.  Vasculature:  Dorsalis Pedis pulse 0/4 bilateral. Posterior Tibial pulse  0/4 bilateral. No ischemia or gangrene. Capillary fill time <5 sec 1-5 bilateral. No hair growth to the level of the digits. Temperature gradient within normal limits. No varicosities present bilateral. No edema present bilateral.   Neurology: The patient has diminished sensation measured with a 5.07/10g Semmes Weinstein Monofilament at all pedal sites bilateral. Vibratory sensation absent bilateral with tuning fork. No Babinski sign present bilateral.   Musculoskeletal: No tenderness to ulcer site. Asymptomatic hammertoe and pes planus pedal deformities noted bilateral. Muscular strength 4/5 in all lower extremity muscular groups bilateral without pain on range of motion . No tenderness with calf compression bilateral.  Assessment and Plan: Problem List Items Addressed This Visit    None    Visit Diagnoses    Pain due to onychomycosis of toenail    -  Primary   Diabetic peripheral vascular disease ([place])       Foot ulcer, limited to breakdown of skin, right ([place])       Healing       -Examined patient. -Discussed and educated patient on diabetic foot care, especially with regards to the vascular, neurological and musculoskeletal systems.  -Stressed the importance of good glycemic control and the detriment of not controlling glucose levels in relation to the foot. -Mechanically debrided all nails 1-5 bilateral using sterile nail nipper and filed with dremel without incident  -Excisionally dedbrided ulceration at Sub met 5 on Right to healthy bleeding borders removing nonviable tissue using a sterile chisel blade. Wound measures post debridement as above. Wound was debrided to the level of the dermis  with viable wound base exposed to promote healing. Hemostasis was achieved with manuel pressure. Patient tolerated procedure well without any discomfort or anesthesia necessary for this wound debridement.  -Applied betadine and dry sterile dressing and instructed patient to continue with daily dressings at home consisting of same as she can - Advised patient to go to the ER or  return to office if the wound worsens or if constitutional symptoms are present. -Continue with diabetic shoes  -Answered all patient questions -Patient to return  in 2 months for right foot wound check and diabetic foot care -Patient advised to call the office if any problems or questions arise in the meantime.  Landis Martins, DPM

## 2017-05-25 ENCOUNTER — Telehealth: Payer: Self-pay | Admitting: *Deleted

## 2017-05-25 MED ORDER — MUPIROCIN 2 % EX OINT: TOPICAL_OINTMENT | CUTANEOUS | 2 refills | Status: DC

## 2017-05-25 NOTE — Telephone Encounter (Signed)
Pt's dtr, [name] states pt needs refills of the cream prescribed by Dr. Cannon Kettle, but pt threw out the tube. I reviewed Meds & Orders, Dr. Cannon Kettle ordered mupirocin ointment. I refilled +2additional and informed [name] pt had an appt 06/28/2017 with Dr. Cannon Kettle and to continue the daily dressing changes until seen.

## 2017-05-26 ENCOUNTER — Other Ambulatory Visit: Payer: Self-pay

## 2017-06-10 ENCOUNTER — Emergency Department (HOSPITAL_COMMUNITY): Payer: Medicare Other

## 2017-06-10 ENCOUNTER — Encounter (HOSPITAL_COMMUNITY): Payer: Self-pay

## 2017-06-10 ENCOUNTER — Inpatient Hospital Stay (HOSPITAL_COMMUNITY)
Admission: EM | Admit: 2017-06-10 | Discharge: 2017-06-13 | DRG: 872 | Disposition: A | Discharge status: Home / Self Care | Payer: Medicare Other | Attending: Internal Medicine | Admitting: Internal Medicine

## 2017-06-10 ENCOUNTER — Other Ambulatory Visit: Payer: Self-pay

## 2017-06-10 DIAGNOSIS — I5032 Chronic diastolic (congestive) heart failure: Secondary | ICD-10-CM

## 2017-06-10 DIAGNOSIS — Z87891 Personal history of nicotine dependence: Secondary | ICD-10-CM | POA: Diagnosis not present

## 2017-06-10 DIAGNOSIS — I482 Chronic atrial fibrillation, unspecified: Secondary | ICD-10-CM | POA: Diagnosis present

## 2017-06-10 DIAGNOSIS — Z7901 Long term (current) use of anticoagulants: Secondary | ICD-10-CM | POA: Diagnosis not present

## 2017-06-10 DIAGNOSIS — Z961 Presence of intraocular lens: Secondary | ICD-10-CM | POA: Diagnosis present

## 2017-06-10 DIAGNOSIS — I2510 Atherosclerotic heart disease of native coronary artery without angina pectoris: Secondary | ICD-10-CM | POA: Diagnosis present

## 2017-06-10 DIAGNOSIS — Z6834 Body mass index (BMI) 34.0-34.9, adult: Secondary | ICD-10-CM

## 2017-06-10 DIAGNOSIS — A419 Sepsis, unspecified organism: Secondary | ICD-10-CM | POA: Diagnosis present

## 2017-06-10 DIAGNOSIS — Z66 Do not resuscitate: Secondary | ICD-10-CM | POA: Diagnosis present

## 2017-06-10 DIAGNOSIS — E1122 Type 2 diabetes mellitus with diabetic chronic kidney disease: Secondary | ICD-10-CM | POA: Diagnosis present

## 2017-06-10 DIAGNOSIS — E669 Obesity, unspecified: Secondary | ICD-10-CM | POA: Diagnosis present

## 2017-06-10 DIAGNOSIS — Z794 Long term (current) use of insulin: Secondary | ICD-10-CM

## 2017-06-10 DIAGNOSIS — Z9842 Cataract extraction status, left eye: Secondary | ICD-10-CM | POA: Diagnosis not present

## 2017-06-10 DIAGNOSIS — N182 Chronic kidney disease, stage 2 (mild): Secondary | ICD-10-CM

## 2017-06-10 DIAGNOSIS — N183 Chronic kidney disease, stage 3 (moderate): Secondary | ICD-10-CM | POA: Diagnosis present

## 2017-06-10 DIAGNOSIS — I10 Essential (primary) hypertension: Secondary | ICD-10-CM | POA: Diagnosis not present

## 2017-06-10 DIAGNOSIS — I252 Old myocardial infarction: Secondary | ICD-10-CM | POA: Diagnosis not present

## 2017-06-10 DIAGNOSIS — E785 Hyperlipidemia, unspecified: Secondary | ICD-10-CM | POA: Diagnosis present

## 2017-06-10 DIAGNOSIS — N179 Acute kidney failure, unspecified: Secondary | ICD-10-CM | POA: Diagnosis present

## 2017-06-10 DIAGNOSIS — M797 Fibromyalgia: Secondary | ICD-10-CM | POA: Diagnosis present

## 2017-06-10 DIAGNOSIS — E1151 Type 2 diabetes mellitus with diabetic peripheral angiopathy without gangrene: Secondary | ICD-10-CM | POA: Diagnosis present

## 2017-06-10 DIAGNOSIS — Z951 Presence of aortocoronary bypass graft: Secondary | ICD-10-CM

## 2017-06-10 DIAGNOSIS — E860 Dehydration: Secondary | ICD-10-CM | POA: Diagnosis present

## 2017-06-10 DIAGNOSIS — Z8673 Personal history of transient ischemic attack (TIA), and cerebral infarction without residual deficits: Secondary | ICD-10-CM | POA: Diagnosis not present

## 2017-06-10 DIAGNOSIS — E1129 Type 2 diabetes mellitus with other diabetic kidney complication: Secondary | ICD-10-CM | POA: Diagnosis present

## 2017-06-10 DIAGNOSIS — Z9841 Cataract extraction status, right eye: Secondary | ICD-10-CM

## 2017-06-10 DIAGNOSIS — I130 Hypertensive heart and chronic kidney disease with heart failure and stage 1 through stage 4 chronic kidney disease, or unspecified chronic kidney disease: Secondary | ICD-10-CM | POA: Diagnosis present

## 2017-06-10 DIAGNOSIS — L03211 Cellulitis of face: Secondary | ICD-10-CM | POA: Diagnosis present

## 2017-06-10 DIAGNOSIS — Z79899 Other long term (current) drug therapy: Secondary | ICD-10-CM

## 2017-06-10 DIAGNOSIS — I480 Paroxysmal atrial fibrillation: Secondary | ICD-10-CM | POA: Diagnosis present

## 2017-06-10 DIAGNOSIS — I4891 Unspecified atrial fibrillation: Secondary | ICD-10-CM | POA: Diagnosis not present

## 2017-06-10 DIAGNOSIS — K219 Gastro-esophageal reflux disease without esophagitis: Secondary | ICD-10-CM | POA: Diagnosis present

## 2017-06-10 DIAGNOSIS — I639 Cerebral infarction, unspecified: Secondary | ICD-10-CM | POA: Diagnosis present

## 2017-06-10 LAB — COMPREHENSIVE METABOLIC PANEL
ALT: 27 U/L (ref 14–54)
AST: 45 U/L — ABNORMAL HIGH (ref 15–41)
Albumin: 3 g/dL — ABNORMAL LOW (ref 3.5–5.0)
Alkaline Phosphatase: 110 U/L (ref 38–126)
Anion gap: 11 (ref 5–15)
BUN: 30 mg/dL — ABNORMAL HIGH (ref 6–20)
CO2: 21 mmol/L — AB (ref 22–32)
Calcium: 9.4 mg/dL (ref 8.9–10.3)
Chloride: 103 mmol/L (ref 101–111)
Creatinine, Ser: 1.52 mg/dL — ABNORMAL HIGH (ref 0.44–1.00)
GFR calc Af Amer: 36 mL/min — ABNORMAL LOW (ref >60)
GFR calc non Af Amer: 31 mL/min — ABNORMAL LOW (ref >60)
GLUCOSE: 190 mg/dL — AB (ref 65–99)
POTASSIUM: 4.6 mmol/L (ref 3.5–5.1)
SODIUM: 135 mmol/L (ref 135–145)
Total Bilirubin: 1.2 mg/dL (ref 0.3–1.2)
Total Protein: 8.3 g/dL — ABNORMAL HIGH (ref 6.5–8.1)

## 2017-06-10 LAB — CBC WITH DIFFERENTIAL/PLATELET
BASOS PCT: 0 %
Basophils Absolute: 0 10*3/uL (ref 0.0–0.1)
EOS ABS: 0 10*3/uL (ref 0.0–0.7)
Eosinophils Relative: 0 %
HCT: 41.2 % (ref 36.0–46.0)
Hemoglobin: 14.1 g/dL (ref 12.0–15.0)
Lymphocytes Relative: 9 %
Lymphs Abs: 1.4 10*3/uL (ref 0.7–4.0)
MCH: 33 pg (ref 26.0–34.0)
MCHC: 34.2 g/dL (ref 30.0–36.0)
MCV: 96.5 fL (ref 78.0–100.0)
MONO ABS: 0.6 10*3/uL (ref 0.1–1.0)
MONOS PCT: 4 %
NEUTROS PCT: 87 %
Neutro Abs: 12.6 10*3/uL — ABNORMAL HIGH (ref 1.7–7.7)
Platelets: 242 10*3/uL (ref 150–400)
RBC: 4.27 MIL/uL (ref 3.87–5.11)
RDW: 13.7 % (ref 11.5–15.5)
WBC: 14.6 10*3/uL — ABNORMAL HIGH (ref 4.0–10.5)

## 2017-06-10 LAB — I-STAT CG4 LACTIC ACID, ED: Lactic Acid, Venous: 1.11 mmol/L (ref 0.5–1.9)

## 2017-06-10 LAB — C-REACTIVE PROTEIN: CRP: 8.9 mg/dL — ABNORMAL HIGH (ref <1.0)

## 2017-06-10 LAB — CBG MONITORING, ED
Glucose-Capillary: 196 mg/dL — ABNORMAL HIGH (ref 65–99)
Glucose-Capillary: 320 mg/dL — ABNORMAL HIGH (ref 65–99)

## 2017-06-10 LAB — LACTIC ACID, PLASMA: LACTIC ACID, VENOUS: 1.3 mmol/L (ref 0.5–1.9)

## 2017-06-10 LAB — SEDIMENTATION RATE: Sed Rate: 92 mm/hr — ABNORMAL HIGH (ref 0–22)

## 2017-06-10 LAB — BRAIN NATRIURETIC PEPTIDE: B NATRIURETIC PEPTIDE 5: 561.6 pg/mL — AB (ref 0.0–100.0)

## 2017-06-10 LAB — PROCALCITONIN: Procalcitonin: <0.1 ng/mL

## 2017-06-10 MED ORDER — ATORVASTATIN CALCIUM 80 MG PO TABS
80.0000 mg | ORAL_TABLET | Freq: Every day | ORAL | Status: DC
  Administered 2017-06-11 – 2017-06-12 (×2): 80 mg via ORAL
  Filled 2017-06-10 (×2): qty 1

## 2017-06-10 MED ORDER — ZOLPIDEM TARTRATE 5 MG PO TABS: 5.0000 mg | ORAL_TABLET | Freq: Every evening | ORAL | Status: DC | PRN

## 2017-06-10 MED ORDER — APIXABAN 5 MG PO TABS
5.0000 mg | ORAL_TABLET | Freq: Two times a day (BID) | ORAL | Status: DC
  Administered 2017-06-11 – 2017-06-13 (×5): 5 mg via ORAL
  Filled 2017-06-10 (×6): qty 1

## 2017-06-10 MED ORDER — INSULIN ASPART 100 UNIT/ML ~~LOC~~ SOLN
0.0000 [IU] | Freq: Three times a day (TID) | SUBCUTANEOUS | Status: DC
  Administered 2017-06-11 – 2017-06-12 (×5): 3 [IU] via SUBCUTANEOUS
  Administered 2017-06-12: 2 [IU] via SUBCUTANEOUS
  Filled 2017-06-10: qty 1

## 2017-06-10 MED ORDER — SODIUM CHLORIDE 0.9 % IV BOLUS (SEPSIS)
500.0000 mL | Freq: Once | INTRAVENOUS | Status: AC
  Administered 2017-06-10: 500 mL via INTRAVENOUS

## 2017-06-10 MED ORDER — CLINDAMYCIN PHOSPHATE 600 MG/50ML IV SOLN
600.0000 mg | Freq: Three times a day (TID) | INTRAVENOUS | Status: DC
  Administered 2017-06-11 (×2): 600 mg via INTRAVENOUS
  Filled 2017-06-10 (×2): qty 50

## 2017-06-10 MED ORDER — MUPIROCIN 2 % EX OINT
TOPICAL_OINTMENT | Freq: Every day | CUTANEOUS | Status: DC
  Administered 2017-06-11 – 2017-06-13 (×3): via TOPICAL
  Filled 2017-06-10 (×2): qty 22

## 2017-06-10 MED ORDER — ONDANSETRON HCL 4 MG/2ML IJ SOLN: 4.0000 mg | Freq: Three times a day (TID) | INTRAMUSCULAR | Status: DC | PRN

## 2017-06-10 MED ORDER — SODIUM CHLORIDE 0.9 % IV SOLN
INTRAVENOUS | Status: DC
  Administered 2017-06-10: 21:00:00 via INTRAVENOUS

## 2017-06-10 MED ORDER — ACETAMINOPHEN 325 MG PO TABS
650.0000 mg | ORAL_TABLET | Freq: Once | ORAL | Status: AC
  Administered 2017-06-10: 650 mg via ORAL
  Filled 2017-06-10: qty 2

## 2017-06-10 MED ORDER — INSULIN NPH (HUMAN) (ISOPHANE) 100 UNIT/ML ~~LOC~~ SUSP: 25.0000 [IU] | Freq: Two times a day (BID) | SUBCUTANEOUS | Status: DC

## 2017-06-10 MED ORDER — AMLODIPINE BESYLATE 5 MG PO TABS
5.0000 mg | ORAL_TABLET | Freq: Every day | ORAL | Status: DC
  Administered 2017-06-11 – 2017-06-13 (×3): 5 mg via ORAL
  Filled 2017-06-10 (×3): qty 1

## 2017-06-10 MED ORDER — IOPAMIDOL (ISOVUE-300) INJECTION 61%
INTRAVENOUS | Status: AC
  Administered 2017-06-10: 75 mL
  Filled 2017-06-10: qty 75

## 2017-06-10 MED ORDER — NITROGLYCERIN 0.4 MG SL SUBL: 0.4000 mg | SUBLINGUAL_TABLET | SUBLINGUAL | Status: DC | PRN

## 2017-06-10 MED ORDER — CLINDAMYCIN PHOSPHATE 600 MG/50ML IV SOLN
600.0000 mg | Freq: Once | INTRAVENOUS | Status: AC
  Administered 2017-06-10: 600 mg via INTRAVENOUS
  Filled 2017-06-10: qty 50

## 2017-06-10 MED ORDER — ACETAMINOPHEN 325 MG PO TABS: 650.0000 mg | ORAL_TABLET | Freq: Four times a day (QID) | ORAL | Status: DC | PRN

## 2017-06-10 MED ORDER — HYDRALAZINE HCL 20 MG/ML IJ SOLN
5.0000 mg | INTRAMUSCULAR | Status: DC | PRN
Start: 2017-06-10 — End: 2017-06-11

## 2017-06-10 MED ORDER — METOPROLOL TARTRATE 25 MG PO TABS
25.0000 mg | ORAL_TABLET | Freq: Two times a day (BID) | ORAL | Status: DC
  Administered 2017-06-11 – 2017-06-13 (×5): 25 mg via ORAL
  Filled 2017-06-10 (×5): qty 1

## 2017-06-10 NOTE — H&P (Addendum)
History and Physical    [name] HUT:[id number] DOB: 12/07/1935 DOA: 06/10/2017  Referring MD/NP/PA:   PCP: Rogers Blocker, MD   Patient coming from:  The patient is coming from home.  At baseline, pt is independent for most of ADL.  Chief Complaint: facial swelling and fain  HPI: Kristina Summers is a 81 y.o. female with medical history significant of hypertension, hyperlipidemia, diabetes mellitus, stroke, GERD, PVD, CAD, s/p of CABG, GI bleeding, dCHF, atrial fibrillation on Eliquis, CKD-2, who presents with facial swelling and pain.  Pt states that she noted swelling in her bilateral upper face and forehead in mirror when woke up this morning. Patient denies any swelling of her tongue, lips, or difficulty breathing. No voice change. No rashes. No recent new medication change or new food. She has central facial pain which is constant, moderate, nonradiating, aggravated by movement of her forehead. She has fever and chills. No neck stiffness or neck pain. She denies any pain with eye movements and denies any vision changes. Patient denies any chest pain, shortness rest, cough, no nausea, vomiting, diarrhea, abdominal pain, symptoms of UTI. On arrival to the ED, patient was given Benadryl as it appeared to look like allergic reaction.  Patient is unsure if the Benadryl has helpe  ED Course: pt was found to have WBC 14.6, lactic acid 1.11, worsening renal function, temperature 102.9, tachycardia, tachypnea, O2 sat 95% on room air. Patient is admitted to telemetry bed as inpatient.   CT-maxillofacial:  1. Generalized facial swelling with pre maxillary mild skin thickening and mild subcutaneous edema. The findings are mild in generally nonspecific. In the appropriate context this could indicate cellulitis. 2. No edema of the deep soft tissues. 3. Old left middle cerebral artery territory infarct.  Review of Systems:   General: has fevers, chills, no body weight gain, has fatigue HEENT: no blurry  vision, hearing changes or sore throat. Has facial swelling and pain. Respiratory: no dyspnea, coughing, wheezing CV: no chest pain, no palpitations GI: no nausea, vomiting, abdominal pain, diarrhea, constipation GU: no dysuria, burning on urination, increased urinary frequency, hematuria  Ext: no leg edema Neuro: no unilateral weakness, numbness, or tingling, no vision change or hearing loss Skin: no rash, no skin tear. MSK: No muscle spasm, no deformity, no limitation of range of movement in spin Heme: No easy bruising.  Travel history: No recent long distant travel.  Allergy:  Allergies  Allergen Reactions  . Morphine And Related Nausea And Vomiting    Chest pain    . Lisinopril Cough  . Sertraline Hcl Nausea Only  . Zoloft [Sertraline Hcl] Nausea Only         Past Medical History:  Diagnosis Date  . Angina   . Arthritis   . Atrial fibrillation ([place])   . CAD (coronary artery disease)   . Carotid artery occlusion   . Diabetes mellitus   . Diastolic CHF ([place]) 65/6812  . Fibromyalgia   . GERD (gastroesophageal reflux disease)   . GI bleed 1/11  . Hyperlipidemia   . Hypertension   . Myocardial infarction ([place]) ~ 1991  . Normal nuclear stress test 03/18/2011  . Obesity   . Peripheral vascular disease ([place])   . Stroke [facility])    May 24, 2014  . UTI (lower urinary tract infection) Sept. 2015    Past Surgical History:  Procedure Laterality Date  . ABDOMINAL AORTAGRAM N/A 04/01/2011   Procedure: ABDOMINAL AORTAGRAM;  Surgeon: Conrad Bath, MD;  Location:  [facility] CATH LAB;  Service: Cardiovascular;  Laterality: N/A;  . ANGIOPLASTY  06/17/11   Left leg common femoral artery cannulation under u/s Left leg runoff  . CATARACT EXTRACTION W/ INTRAOCULAR LENS  IMPLANT, BILATERAL  2004-2005  . CORONARY ARTERY BYPASS GRAFT  2000   CABG X5  . EYE SURGERY    . LOWER EXTREMITY ANGIOGRAM Bilateral 04/01/2011   Procedure: LOWER EXTREMITY ANGIOGRAM;  Surgeon: Conrad [place], MD;   Location: [facility] CATH LAB;  Service: Cardiovascular;  Laterality: Bilateral;  . LOWER EXTREMITY ANGIOGRAM Left 06/17/2011   Procedure: LOWER EXTREMITY ANGIOGRAM;  Surgeon: Conrad Litchfield, MD;  Location: [facility] CATH LAB;  Service: Cardiovascular;  Laterality: Left;  . LOWER EXTREMITY ANGIOGRAM N/A 11/18/2011   Procedure: LOWER EXTREMITY ANGIOGRAM;  Surgeon: Conrad Davidson, MD;  Location: [facility] CATH LAB;  Service: Cardiovascular;  Laterality: N/A;  . PERCUTANEOUS STENT INTERVENTION Right 04/01/2011   Procedure: PERCUTANEOUS STENT INTERVENTION;  Surgeon: Conrad [place], MD;  Location: [facility] CATH LAB;  Service: Cardiovascular;  Laterality: Right;  rt iliac stent  . PERIPHERAL ARTERIAL STENT GRAFT  04/01/11   right common iliac  . TRIGGER FINGER RELEASE  1996   right thumb    Social History:  reports that she quit smoking about 27 years ago. Her smoking use included cigarettes. She has a 60.00 pack-year smoking history. she has never used smokeless tobacco. She reports that she does not drink alcohol or use drugs.  Family History:  Family History  Problem Relation Age of Onset  . Other Brother        intestinal blockage  . Diabetes Brother      Prior to Admission medications   Medication Sig Start Date End Date Taking? Authorizing Provider  acetaminophen (TYLENOL) 325 MG tablet Take 2 tablets (650 mg total) by mouth every 6 (six) hours as needed for mild pain (or Fever >/= 101). 08/27/13  Yes Theodis Blaze, MD  amLODipine (NORVASC) 5 MG tablet Take 1 tablet by mouth daily. 03/06/12   [provider]  apixaban (ELIQUIS) 5 MG TABS tablet Take 1 tablet (5 mg total) by mouth 2 (two) times daily. 06/03/14   Rai, Vernelle Emerald, MD  atorvastatin (LIPITOR) 80 MG tablet Take 1 tablet (80 mg total) by mouth daily. 11/27/12   Luana Shu, Freeman Caldron, PA-C  furosemide (LASIX) 40 MG tablet Take 1 tablet (40 mg total) by mouth daily. Patient taking differently: Take 40 mg by mouth 2 (two) times daily.  06/03/14   Rai, Ripudeep Raliegh Ip,  MD  HUMULIN R 100 UNIT/ML injection inject 5 units subcutaneously BEFORE BREAKFAST AND DINNER 11/26/16   [provider]  insulin NPH Human (HUMULIN N,NOVOLIN N) 100 UNIT/ML injection Inject 0.38 mLs (38 Units total) into the skin 2 (two) times daily. 38 in am and 34 at night Patient taking differently: Inject 37 Units into the skin daily before breakfast. 37 in am and 30 at night,  Takes Humulin R 5Units am and pm 06/03/14   Rai, Ripudeep K, MD  losartan (COZAAR) 50 MG tablet Take 1 tablet (50 mg total) by mouth daily. Patient taking differently: Take 50 mg by mouth 2 (two) times daily.  06/03/14   Rai, Vernelle Emerald, MD  metoprolol tartrate (LOPRESSOR) 25 MG tablet Take 1 tablet by mouth 2 (two) times daily. 04/16/14   [provider]  Multiple Vitamins-Minerals (ONE-A-DAY 50 PLUS PO) Take 1 tablet by mouth daily.    [provider]  mupirocin ointment (BACTROBAN) 2 %  Apply daily to right foot ulceration 03/15/17   Landis Martins, DPM  mupirocin ointment (BACTROBAN) 2 % Apply to affected area daily. 05/25/17   Stover, Titorya, DPM  NITROSTAT 0.4 MG SL tablet Place 1 tablet under the tongue every 5 (five) minutes x 3 doses as needed. Chest pain 03/07/14   [provider]    Physical Exam: Vitals:   06/10/17 1827 06/10/17 1830 06/10/17 1915 06/10/17 2021  BP: (!) 180/81  (!) 160/82   Pulse: (!) 115  (!) 105   Resp: 16  (!) 24   Temp:  (!) 102.9 F (39.4 C)  (!) 102.1 F (38.9 C)  TempSrc:  Rectal  Rectal  SpO2: 95%  94%   Weight:      Height:       General: Not in acute distress HEENT: has swelling and tenderness in bilateral upper faces and forehead, with mild erythema.       Eyes: PERRL, EOMI, no scleral icterus.       ENT: No discharge from the ears and nose, no pharynx injection, no tonsillar enlargement.        Neck: No JVD, no bruit, no mass felt. Heme: No neck lymph node enlargement. Cardiac: S1/S2, RRR, No murmurs, No gallops or  rubs. Respiratory: No rales, wheezing, rhonchi or rubs. GI: Soft, nondistended, nontender, no rebound pain, no organomegaly, BS present. GU: No hematuria Ext: No pitting leg edema bilaterally. 2+DP/PT pulse bilaterally. Musculoskeletal: No joint deformities, No joint redness or warmth, no limitation of ROM in spin. Skin: No rashes.  Neuro: Alert, oriented X3, cranial nerves II-XII grossly intact, moves all extremities normally. Psych: Patient is not psychotic, no suicidal or hemocidal ideation.  Labs on Admission: I have personally reviewed following labs and imaging studies  CBC: Recent Labs  Lab 06/10/17 1634  WBC 14.6*  NEUTROABS 12.6*  HGB 14.1  HCT 41.2  MCV 96.5  PLT 726   Basic Metabolic Panel: Recent Labs  Lab 06/10/17 1634  NA 135  K 4.6  CL 103  CO2 21*  GLUCOSE 190*  BUN 30*  CREATININE 1.52*  CALCIUM 9.4   GFR: Estimated Creatinine Clearance: 35.1 mL/min (A) (by C-G formula based on SCr of 1.52 mg/dL (H)). Liver Function Tests: Recent Labs  Lab 06/10/17 1634  AST 45*  ALT 27  ALKPHOS 110  BILITOT 1.2  PROT 8.3*  ALBUMIN 3.0*   No results for input(s): LIPASE, AMYLASE in the last 168 hours. No results for input(s): AMMONIA in the last 168 hours. Coagulation Profile: No results for input(s): INR, PROTIME in the last 168 hours. Cardiac Enzymes: No results for input(s): CKTOTAL, CKMB, CKMBINDEX, TROPONINI in the last 168 hours. BNP (last 3 results) No results for input(s): PROBNP in the last 8760 hours. HbA1C: No results for input(s): HGBA1C in the last 72 hours. CBG: Recent Labs  Lab 06/10/17 2016  GLUCAP 196*   Lipid Profile: No results for input(s): CHOL, HDL, LDLCALC, TRIG, CHOLHDL, LDLDIRECT in the last 72 hours. Thyroid Function Tests: No results for input(s): TSH, T4TOTAL, FREET4, T3FREE, THYROIDAB in the last 72 hours. Anemia Panel: No results for input(s): VITAMINB12, FOLATE, FERRITIN, TIBC, IRON, RETICCTPCT in the last 72  hours. Urine analysis:    Component Value Date/Time   COLORURINE YELLOW 05/29/2014 1052   APPEARANCEUR TURBID (A) 05/29/2014 1052   LABSPEC 1.016 05/29/2014 1052   PHURINE 5.5 05/29/2014 1052   GLUCOSEU 100 (A) 05/29/2014 1052   HGBUR LARGE (A) 05/29/2014 1052  BILIRUBINUR NEGATIVE 05/29/2014 1052   KETONESUR NEGATIVE 05/29/2014 1052   PROTEINUR 100 (A) 05/29/2014 1052   UROBILINOGEN 1.0 05/29/2014 1052   NITRITE NEGATIVE 05/29/2014 1052   LEUKOCYTESUR LARGE (A) 05/29/2014 1052   Sepsis Labs: _0 (procalcitonin:4,lacticidven:4) )No results found for this or any previous visit (from the past 240 hour(s)).   Radiological Exams on Admission: Dg Chest 2 View  Result Date: 06/10/2017 CLINICAL DATA:  Chills and tachycardia.  Cough.  Facial swelling. EXAM: CHEST  2 VIEW COMPARISON:  01/28/2017 FINDINGS: Chronic cardiomegaly. Status post CABG. Large appearance of the central pulmonary vessels. No Kerley lines or effusion. No air bronchogram. Atherosclerotic calcification. IMPRESSION: Cardiomegaly and vascular congestion.  No edema or focal pneumonia. Electronically Signed   By: Monte Fantasia M.D.   On: 06/10/2017 16:08   Ct Maxillofacial W Contrast  Result Date: 06/10/2017 CLINICAL DATA:  Facial swelling EXAM: CT MAXILLOFACIAL WITH CONTRAST TECHNIQUE: Multidetector CT imaging of the maxillofacial structures was performed with intravenous contrast. Multiplanar CT image reconstructions were also generated. CONTRAST:  76m ISOVUE-300 IOPAMIDOL (ISOVUE-300) INJECTION 61% COMPARISON:  Head CT 05/30/2014 FINDINGS: Osseous: The patient is edentulous. There is no facial fracture or mandibular dislocation. No focal osseous lesions. Orbits: Normal Sinuses: Clear Soft tissues: There is generalized facial swelling, most apparent in the pre maxillary face. However, there is no mass or fluid collection. There is mild skin thickening and slight hyperattenuation of the subcutaneous fat anterior to the  maxilla. Limited intracranial: Vascular calcifications and old left MCA infarct. IMPRESSION: 1. Generalized facial swelling with pre maxillary mild skin thickening and mild subcutaneous edema. The findings are mild in generally nonspecific. In the appropriate context this could indicate cellulitis. 2. No edema of the deep soft tissues. 3. Old left middle cerebral artery territory infarct. Electronically Signed   By: KUlyses JarredM.D.   On: 06/10/2017 18:27     EKG:  Not done in ED, will get one.   Assessment/Plan Principal Problem:   Facial cellulitis Active Problems:   CAD (coronary artery disease)   Paroxysmal atrial fibrillation   Hyperlipidemia   S/P CABG (coronary artery bypass graft)   Stroke (HCC)   Sepsis (HCC)   Acute renal failure superimposed on stage 2 chronic kidney disease (HCC)   Chronic diastolic CHF ([place]estive heart failure) [place]   Essential hypertension   Type II diabetes mellitus with renal manifestations [place]   Sepsis due to facial cellulitis: Initially patient was given Benadryl as it appeared to look like allergic reaction, but pt does not have typical allergic reaction symptoms, denies swelling in tongue, lips, or difficulty breathing. No rashes. No recent medication change. Patient meets criteria for sepsis with fever, leukocytosis, tachycardia and tachypnea, clinically is more consistent with cellulitis. Maxillofacial CT scan findings are also consistent with facial cellulitis.  - will admit to tele bed as inpt - Empiric antimicrobial treatment with clindamycin - PRN Zofran for nausea, tylenol for pain (pt does not want to take narcotic pain medications) - Blood cultures x 2  - ESR and CRP - will get Procalcitonin and trend lactic acid levels per sepsis protocol. - IVF: 500 x 2 L of NS bolus in ED, followed by 75 cc/h  (patient has congestive heart failure, limiting aggressive IV fluids treatment).  CAD: s/p of CABG. No CP -continue Lipitor,  metoprolol -When necessary nitroglycerin  Chronic dCHF: 2-D echo on 01/28/17 showed EF of 50-55 percent. Patient does not have leg edema JVD. No shortness of breath or chest pain. CHF  is compensated. -Hold her Lasix due to sepsis and worsening renal function -Check BMP  Atrial Fibrillation: CHA2DS2-VASc Score is 9, needs oral anticoagulation. Patient is on Eliquis at home. INR is  on admission. Heart rate is 110s. -continue Eliquis and metoprolol   HLD: -Lipitor  Hx of stroke: -on Eliquis for A fib -Lipitor  HTN:  -hold cozarr due to worsening renal function -Continue metoprolol and amlodipine -IV hydralazine when necessary  Acute renal failure superimposed on stage 2 chronic kidney disease ([place]): Baseline creatinine 1.2. Her creatinine is 1.52, BUN 30. Likely due to prerenal secondary to dehydration and continuation of ARB, diuretics - IVF as above - Check FeUrea - Follow up renal function by BMP - Hold lasix and cozarr  Type II diabetes mellitus with renal manifestations ([place]): Last A1c 10.4 on 05/29/14, poorly controled. Patient is taking Humulin and NPH Humulin at home -will decrease NPH Humulin dose from 37 to 25 U bid -SSI -Check A1c   DVT ppx: on Eliquis Code Status: DNR (I discussed with patient in the presence of her daughter, and explained the meaning of [place]. Patient wants to be DNR) Family Communication: Yes, patient's daughter at bed side Disposition Plan:  Anticipate discharge back to previous home environment Consults called:  none Admission status: Inpatient/tele      Date of Service 06/10/2017    Ivor Costa [facility] Pager [phone]  If 7PM-7AM, please contact night-coverage [email or web address] Password [facility] 06/10/2017, 8:37 PM

## 2017-06-10 NOTE — ED Provider Notes (Addendum)
[facility] EMERGENCY DEPARTMENT Provider Note   CSN: [id number] Arrival date & time: 06/10/17  1508     History   Chief Complaint Chief Complaint  Patient presents with  . Facial Swelling    HPI Colleena Kurtenbach is a 82 y.o. female.  The history is provided by the patient and medical records. No language interpreter was used.  Rash   This is a new problem. The current episode started 12 to 24 hours ago. The problem has been gradually worsening. The problem is associated with nothing. The maximum temperature recorded prior to her arrival was 101 to 101.9 F. The rash is present on the face. The pain is moderate. The pain has been constant since onset. Associated symptoms include pain. She has tried nothing for the symptoms. The treatment provided no relief.    Past Medical History:  Diagnosis Date  . Angina   . Arthritis   . Atrial fibrillation ([place])   . CAD (coronary artery disease)   . Carotid artery occlusion   . Diabetes mellitus   . Diastolic CHF ([place]) 44/0347  . Fibromyalgia   . GERD (gastroesophageal reflux disease)   . GI bleed 1/11  . Hyperlipidemia   . Hypertension   . Myocardial infarction ([place]) ~ 1991  . Normal nuclear stress test 03/18/2011  . Obesity   . Peripheral vascular disease ([place])   . Stroke [facility])    May 24, 2014  . UTI (lower urinary tract infection) Sept. 2015    Patient Active Problem List   Diagnosis Date Noted  . Acute exacerbation of Diastolic CHF (congestive heart failure) /EF 60 % 01/28/2017  . Cerebral infarction ([place]) June 09, 202016  . Cerebral embolism with cerebral infarction ([place]) June 09, 202016  . Fall   . Stroke ([place])   . UTI (lower urinary tract infection) 05/29/2014  . Fracture of distal end of right fibula 05/29/2014  . Rhabdomyolysis 05/29/2014  . Transaminitis 05/29/2014  . Weakness of both lower extremities 01/11/2014  . Pain of left lower extremity- and Right 01/11/2014  . Aftercare following surgery of the  circulatory system, [place] 01/11/2014  . Peripheral vascular disease, unspecified ([place]) 01/11/2014  . Cellulitis of great toe, right 08/23/2013  . CAD (coronary artery disease)   . Hypertensive heart disease without CHF   . Paroxysmal atrial fibrillation   . Diabetes mellitus type 2 with peripheral artery disease ([place])   . Obesity (BMI 30-39.9)   . Hyperlipidemia   . Atherosclerosis of native arteries of extremity with intermittent claudication ([place])   . History of GI bleed 04/21/2009  . S/P CABG (coronary artery bypass graft) 11/20/1998    Past Surgical History:  Procedure Laterality Date  . ABDOMINAL AORTAGRAM N/A 04/01/2011   Procedure: ABDOMINAL AORTAGRAM;  Surgeon: Conrad Olar, MD;  Location: [facility] CATH LAB;  Service: Cardiovascular;  Laterality: N/A;  . ANGIOPLASTY  06/17/11   Left leg common femoral artery cannulation under u/s Left leg runoff  . CATARACT EXTRACTION W/ INTRAOCULAR LENS  IMPLANT, BILATERAL  2004-2005  . CORONARY ARTERY BYPASS GRAFT  2000   CABG X5  . EYE SURGERY    . LOWER EXTREMITY ANGIOGRAM Bilateral 04/01/2011   Procedure: LOWER EXTREMITY ANGIOGRAM;  Surgeon: Conrad Bokeelia, MD;  Location[facility] CATH LAB;  Service: Cardiovascular;  Laterality: Bilateral;  . LOWER EXTREMITY ANGIOGRAM Left 06/17/2011   Procedure: LOWER EXTREMITY ANGIOGRAM;  Surgeon: Conrad [place], MD;  Location: [facility] CATH LAB;  Service: Cardiovascular;  Laterality: Left;  . LOWER  EXTREMITY ANGIOGRAM N/A 11/18/2011   Procedure: LOWER EXTREMITY ANGIOGRAM;  Surgeon: Conrad Piute, MD;  Location: [facility] CATH LAB;  Service: Cardiovascular;  Laterality: N/A;  . PERCUTANEOUS STENT INTERVENTION Right 04/01/2011   Procedure: PERCUTANEOUS STENT INTERVENTION;  Surgeon: Conrad Evan, MD;  Location: [facility] CATH LAB;  Service: Cardiovascular;  Laterality: Right;  rt iliac stent  . PERIPHERAL ARTERIAL STENT GRAFT  04/01/11   right common iliac  . TRIGGER FINGER RELEASE  1996   right thumb    OB History    No data available         Home Medications    Prior to Admission medications   Medication Sig Start Date End Date Taking? Authorizing Provider  acetaminophen (TYLENOL) 325 MG tablet Take 2 tablets (650 mg total) by mouth every 6 (six) hours as needed for mild pain (or Fever >/= 101). 08/27/13   Theodis Blaze, MD  amLODipine (NORVASC) 5 MG tablet Take 1 tablet by mouth daily. 03/06/12   [provider]  apixaban (ELIQUIS) 5 MG TABS tablet Take 1 tablet (5 mg total) by mouth 2 (two) times daily. 06/03/14   Rai, Vernelle Emerald, MD  atorvastatin (LIPITOR) 80 MG tablet Take 1 tablet (80 mg total) by mouth daily. 11/27/12   Luana Shu, Freeman Caldron, PA-C  furosemide (LASIX) 40 MG tablet Take 1 tablet (40 mg total) by mouth daily. Patient taking differently: Take 40 mg by mouth 2 (two) times daily.  06/03/14   Rai, Ripudeep Raliegh Ip, MD  HUMULIN R 100 UNIT/ML injection inject 5 units subcutaneously BEFORE BREAKFAST AND DINNER 11/26/16   [provider]  insulin NPH Human (HUMULIN N,NOVOLIN N) 100 UNIT/ML injection Inject 0.38 mLs (38 Units total) into the skin 2 (two) times daily. 38 in am and 34 at night Patient taking differently: Inject 37 Units into the skin daily before breakfast. 37 in am and 30 at night,  Takes Humulin R 5Units am and pm 06/03/14   Rai, Ripudeep K, MD  losartan (COZAAR) 50 MG tablet Take 1 tablet (50 mg total) by mouth daily. Patient taking differently: Take 50 mg by mouth 2 (two) times daily.  06/03/14   Rai, Vernelle Emerald, MD  metoprolol tartrate (LOPRESSOR) 25 MG tablet Take 1 tablet by mouth 2 (two) times daily. 04/16/14   [provider]  Multiple Vitamins-Minerals (ONE-A-DAY 50 PLUS PO) Take 1 tablet by mouth daily.    [provider]  mupirocin ointment (BACTROBAN) 2 % Apply daily to right foot ulceration 03/15/17   Landis Martins, DPM  mupirocin ointment (BACTROBAN) 2 % Apply to affected area daily. 05/25/17   Stover, Titorya, DPM  NITROSTAT 0.4 MG SL tablet Place 1 tablet under the  tongue every 5 (five) minutes x 3 doses as needed. Chest pain 03/07/14   [provider]    Family History Family History  Problem Relation Age of Onset  . Other Brother        intestinal blockage  . Diabetes Brother     Social History Social History   Tobacco Use  . Smoking status: Former Smoker    Packs/day: 2.00    Years: 30.00    Pack years: 60.00    Types: Cigarettes    Last attempt to quit: 04/19/1990    Years since quitting: 27.1  . Smokeless tobacco: Never Used  . Tobacco comment: stopped smoking cigarettes 1991  Substance Use Topics  . Alcohol use: No    Alcohol/week: 0.0 oz  . Drug  use: No     Allergies   Morphine and related; Lisinopril; Sertraline hcl; and Zoloft [sertraline hcl]   Review of Systems Review of Systems  Constitutional: Negative for chills, diaphoresis, fatigue and fever.  HENT: Positive for facial swelling. Negative for congestion.   Eyes: Negative for photophobia, pain and visual disturbance.  Respiratory: Negative for cough, chest tightness, shortness of breath and wheezing.   Cardiovascular: Negative for chest pain and palpitations.  Gastrointestinal: Negative for abdominal pain, constipation, diarrhea, nausea and vomiting.  Genitourinary: Negative for enuresis and flank pain.  Musculoskeletal: Negative for back pain, myalgias, neck pain and neck stiffness.  Skin: Positive for rash. Negative for wound.  Neurological: Negative for dizziness, weakness, light-headedness and headaches.  Psychiatric/Behavioral: Negative for agitation and confusion.  All other systems reviewed and are negative.    Physical Exam Updated Vital Signs BP (!) 165/106   Pulse (!) 107   Temp 98.6 F (37 C) (Oral)   Resp (!) 21   Ht 5\' 6"  (1.676 m)   Wt 96.2 kg (212 lb)   SpO2 95%   BMI 34.22 kg/m   Physical Exam  Constitutional: She is oriented to person, place, and time. She appears well-developed and well-nourished. No distress.  HENT:    Head: Head is with right periorbital erythema and with left periorbital erythema.    Nose: Nose normal.  Mouth/Throat: Oropharynx is clear and moist. No oropharyngeal exudate.  Eyes: Conjunctivae and EOM are normal. Pupils are equal, round, and reactive to light. Right conjunctiva is not injected. Right conjunctiva has no hemorrhage. Left conjunctiva is not injected. Left conjunctiva has no hemorrhage. Right eye exhibits normal extraocular motion and no nystagmus. Left eye exhibits normal extraocular motion and no nystagmus. Pupils are equal.  Neck: Normal range of motion.  Cardiovascular: Normal rate and intact distal pulses.  No murmur heard. Pulmonary/Chest: Breath sounds normal. She has no wheezes. She has no rales. She exhibits no tenderness.  Abdominal: She exhibits no distension. There is no tenderness.  Musculoskeletal: She exhibits no edema or tenderness.  Neurological: She is alert and oriented to person, place, and time. No sensory deficit. She exhibits normal muscle tone.  Skin: Capillary refill takes less than 2 seconds. Rash noted. She is not diaphoretic. There is erythema.  Psychiatric: She has a normal mood and affect.  Nursing note and vitals reviewed.    ED Treatments / Results  Labs (all labs ordered are listed, but only abnormal results are displayed) Labs Reviewed  CBC WITH DIFFERENTIAL/PLATELET - Abnormal; Notable for the following components:      Result Value   WBC 14.6 (*)    Neutro Abs 12.6 (*)    All other components within normal limits  COMPREHENSIVE METABOLIC PANEL - Abnormal; Notable for the following components:   CO2 21 (*)    Glucose, Bld 190 (*)    BUN 30 (*)    Creatinine, Ser 1.52 (*)    Total Protein 8.3 (*)    Albumin 3.0 (*)    AST 45 (*)    GFR calc non Af Amer 31 (*)    GFR calc Af Amer 36 (*)    All other components within normal limits  BRAIN NATRIURETIC PEPTIDE - Abnormal; Notable for the following components:   B Natriuretic  Peptide 561.6 (*)    All other components within normal limits  C-REACTIVE PROTEIN - Abnormal; Notable for the following components:   CRP 8.9 (*)    All other components within  normal limits  SEDIMENTATION RATE - Abnormal; Notable for the following components:   Sed Rate 92 (*)    All other components within normal limits  CBG MONITORING, ED - Abnormal; Notable for the following components:   Glucose-Capillary 196 (*)    All other components within normal limits  CBG MONITORING, ED - Abnormal; Notable for the following components:   Glucose-Capillary 320 (*)    All other components within normal limits  CULTURE, BLOOD (ROUTINE X 2)  CULTURE, BLOOD (ROUTINE X 2)  LACTIC ACID, PLASMA  PROCALCITONIN  CREATININE, URINE, RANDOM  UREA NITROGEN, URINE  LACTIC ACID, PLASMA  HEMOGLOBIN A1C  I-STAT CG4 LACTIC ACID, ED    EKG  EKG Interpretation None       Radiology Dg Chest 2 View  Result Date: 06/10/2017 CLINICAL DATA:  Chills and tachycardia.  Cough.  Facial swelling. EXAM: CHEST  2 VIEW COMPARISON:  01/28/2017 FINDINGS: Chronic cardiomegaly. Status post CABG. Large appearance of the central pulmonary vessels. No Kerley lines or effusion. No air bronchogram. Atherosclerotic calcification. IMPRESSION: Cardiomegaly and vascular congestion.  No edema or focal pneumonia. Electronically Signed   By: Monte Fantasia M.D.   On: 06/10/2017 16:08   Ct Maxillofacial W Contrast  Result Date: 06/10/2017 CLINICAL DATA:  Facial swelling EXAM: CT MAXILLOFACIAL WITH CONTRAST TECHNIQUE: Multidetector CT imaging of the maxillofacial structures was performed with intravenous contrast. Multiplanar CT image reconstructions were also generated. CONTRAST:  51mL ISOVUE-300 IOPAMIDOL (ISOVUE-300) INJECTION 61% COMPARISON:  Head CT 05/30/2014 FINDINGS: Osseous: The patient is edentulous. There is no facial fracture or mandibular dislocation. No focal osseous lesions. Orbits: Normal Sinuses: Clear Soft tissues:  There is generalized facial swelling, most apparent in the pre maxillary face. However, there is no mass or fluid collection. There is mild skin thickening and slight hyperattenuation of the subcutaneous fat anterior to the maxilla. Limited intracranial: Vascular calcifications and old left MCA infarct. IMPRESSION: 1. Generalized facial swelling with pre maxillary mild skin thickening and mild subcutaneous edema. The findings are mild in generally nonspecific. In the appropriate context this could indicate cellulitis. 2. No edema of the deep soft tissues. 3. Old left middle cerebral artery territory infarct. Electronically Signed   By: Ulyses Jarred M.D.   On: 06/10/2017 18:27    Procedures Procedures (including critical care time)  CRITICAL CARE Performed by: Gwenyth Allegra Tegeler Total critical care time: 35 minutes Critical care time was exclusive of separately billable procedures and treating other patients. Critical care was necessary to treat or prevent imminent or life-threatening deterioration. Critical care was time spent personally by me on the following activities: development of treatment plan with patient and/or surrogate as well as nursing, discussions with consultants, evaluation of patient's response to treatment, examination of patient, obtaining history from patient or surrogate, ordering and performing treatments and interventions, ordering and review of laboratory studies, ordering and review of radiographic studies, pulse oximetry and re-evaluation of patient's condition.   Medications Ordered in ED Medications  acetaminophen (TYLENOL) tablet 650 mg (not administered)  ondansetron (ZOFRAN) injection 4 mg (not administered)  hydrALAZINE (APRESOLINE) injection 5 mg (not administered)  zolpidem (AMBIEN) tablet 5 mg (not administered)  clindamycin (CLEOCIN) IVPB 600 mg (not administered)  insulin aspart (novoLOG) injection 0-9 Units (not administered)  amLODipine (NORVASC)  tablet 5 mg (not administered)  apixaban (ELIQUIS) tablet 5 mg (not administered)  atorvastatin (LIPITOR) tablet 80 mg (not administered)  insulin NPH Human (HUMULIN N,NOVOLIN N) injection 25 Units (not administered)  metoprolol tartrate (  LOPRESSOR) tablet 25 mg (not administered)  mupirocin ointment (BACTROBAN) 2 % (not administered)  nitroGLYCERIN (NITROSTAT) SL tablet 0.4 mg (not administered)  0.9 %  sodium chloride infusion ( Intravenous Rate/Dose Change 06/10/17 2227)  iopamidol (ISOVUE-300) 61 % injection (75 mLs  Contrast Given 06/10/17 1742)  acetaminophen (TYLENOL) tablet 650 mg (650 mg Oral Given 06/10/17 1853)  clindamycin (CLEOCIN) IVPB 600 mg (0 mg Intravenous Stopped 06/10/17 2008)  sodium chloride 0.9 % bolus 500 mL (0 mLs Intravenous Stopped 06/10/17 2021)  sodium chloride 0.9 % bolus 500 mL (0 mLs Intravenous Stopped 06/10/17 2232)     Initial Impression / Assessment and Plan / ED Course  I have reviewed the triage vital signs and the nursing notes.  Pertinent labs & imaging results that were available during my care of the patient were reviewed by me and considered in my medical decision making (see chart for details).     Kristina Summers is a 82 y.o. female with a past medical history significant for hypertension, hyperlipidemia, stroke, GERD, prior GI bleed, CHF, diabetes, CAD, and atrial fibrillation on Eliquis therapy who presents with facial swelling, facial pain, and facial redness.  Patient reports that she woke up this morning and when she looked in the mirror she had swelling and redness across her face and forehead.  She reports that any movement of her forehead this causes severe pain in the front of her face.  She denies any pain with eye movements and denies any vision changes.  She denies any nausea, vomiting, chest pain, or shortness of breath.  She does report some shaking chills and has been shivering all day.  She denies any urinary symptoms, conservation, diarrhea.   She does report a dry chronic cough.  She denies any chest pain, palpitations, or syncopal episodes.  She denies any neck stiffness or neck pain however when she moves her head around the face hurts.  She has never had this before.  She denies new medications or new foods.  This started before she ingested anything this morning for breakfast.  On arrival to the ED, patient was given Benadryl as it appeared to look like allergic reaction.  Patient denies any swelling of her tongue, lips, or difficulty breathing.  She denies any voice change or other airway problem.  Patient is unsure if the Benadryl has helped at all however she currently has a moderate to severe pain.  She reports it is a constant 9 out of 10 severity in her central face.  On evaluation, patient is tachycardic.  Patient is in A. fib which is chronic for her.  Patient was very warm to the touch so a rectal temperature will be obtained.  Patient had full range motion of her neck.  Oropharynx is unremarkable.  No nasal or abnormality seen.  Patient did have tenderness across her forehead and on the bridge of her nose.  Patient had no consensual photophobia and had no pain with eye movements.  Tenderness around her eyes.  Otherwise lungs clear.  Chest nontender.  Abdomen nontender.  No significant edema or other abnormality seen on exam.  Clinically I am concerned about a facial cellulitis versus a severe sinusitis with external changes.  Given her lack of extraocular movement abnormalities or vision changes I currently do not feel patient has a cavernous sinus thrombosis/infection however will obtain CT imaging to further evaluate.  Patient will have laboratory testing as well.  Clinically I am more concerned about infection and allergic reaction  at this time.  Rectal temperature was checked and was found to be 102.9.  Patient remained tachycardic.  Patient found to have leukocytosis and acute kidney injury.  CT scan showed concern for  cellulitis.  Next  Clinically I feel this confirms a facial cellulitis that has caused the patient to have Sirs and sepsis.  Patient's lactic acid was normal.  In the setting of her heart failure will be gentle with rehydration.  Patient will be given IV antibiotics, cultures obtained, and patient will be admitted for facial cellulitis requiring IV antibiotics as well as for her acute kidney injury.    Final Clinical Impressions(s) / ED Diagnoses   Final diagnoses:  Sepsis, due to unspecified organism [facility])  Facial cellulitis  AKI (acute kidney injury) [facility])    ED Discharge Orders    None     Clinical Impression: 1. Sepsis, due to unspecified organism ([place])   2. Facial cellulitis   3. AKI (acute kidney injury) ([place])     Disposition: Admit  This note was prepared with assistance of Dragon voice recognition software. Occasional wrong-word or sound-a-like substitutions may have occurred due to the inherent limitations of voice recognition software.     Tegeler, Gwenyth Allegra, MD 06/10/17 2356    Tegeler, Gwenyth Allegra, MD 06/20/17 1001

## 2017-06-10 NOTE — ED Triage Notes (Signed)
Pt brought in by [facility] from home for facial swelling found at 0600 this am. Pt denies starting any new medications, face washes, or new detergent. Pt denies hives/itching/SOB/CP. Pt ahs hx CHF and diabetes. Pt given 50mg  benadryl PTA. Pt A+Ox4 and in no apparent distress at this time.

## 2017-06-11 ENCOUNTER — Encounter (HOSPITAL_COMMUNITY): Payer: Self-pay | Admitting: *Deleted

## 2017-06-11 LAB — BASIC METABOLIC PANEL
Anion gap: 8 (ref 5–15)
BUN: 35 mg/dL — AB (ref 6–20)
CALCIUM: 8.5 mg/dL — AB (ref 8.9–10.3)
CHLORIDE: 107 mmol/L (ref 101–111)
CO2: 19 mmol/L — ABNORMAL LOW (ref 22–32)
CREATININE: 1.57 mg/dL — AB (ref 0.44–1.00)
GFR calc non Af Amer: 30 mL/min — ABNORMAL LOW (ref >60)
GFR, EST AFRICAN AMERICAN: 35 mL/min — AB (ref >60)
Glucose, Bld: 251 mg/dL — ABNORMAL HIGH (ref 65–99)
Potassium: 4.2 mmol/L (ref 3.5–5.1)
Sodium: 134 mmol/L — ABNORMAL LOW (ref 135–145)

## 2017-06-11 LAB — LACTIC ACID, PLASMA: LACTIC ACID, VENOUS: 1 mmol/L (ref 0.5–1.9)

## 2017-06-11 LAB — GLUCOSE, CAPILLARY
Glucose-Capillary: 242 mg/dL — ABNORMAL HIGH (ref 65–99)
Glucose-Capillary: 253 mg/dL — ABNORMAL HIGH (ref 65–99)

## 2017-06-11 LAB — CREATININE, URINE, RANDOM: Creatinine, Urine: 83.07 mg/dL

## 2017-06-11 LAB — CBG MONITORING, ED: GLUCOSE-CAPILLARY: 230 mg/dL — AB (ref 65–99)

## 2017-06-11 MED ORDER — AMOXICILLIN-POT CLAVULANATE 875-125 MG PO TABS
1.0000 | ORAL_TABLET | Freq: Two times a day (BID) | ORAL | Status: DC
  Administered 2017-06-11 – 2017-06-13 (×4): 1 via ORAL
  Filled 2017-06-11 (×4): qty 1

## 2017-06-11 MED ORDER — INSULIN NPH (HUMAN) (ISOPHANE) 100 UNIT/ML ~~LOC~~ SUSP
25.0000 [IU] | Freq: Two times a day (BID) | SUBCUTANEOUS | Status: DC
  Administered 2017-06-11 – 2017-06-12 (×3): 25 [IU] via SUBCUTANEOUS
  Filled 2017-06-11: qty 10

## 2017-06-11 NOTE — ED Notes (Signed)
Pt ambulated to restroom but was unable to obtain sample. Incontinence pad in place.

## 2017-06-11 NOTE — Progress Notes (Signed)
PROGRESS NOTE    Kristina Summers  UKG:[id number]  DOB: 1935-05-13  DOA: 06/10/2017 PCP: Rogers Blocker, MD Outpatient Specialists:   Hospital course: Kristina Summers is a 82 y.o. female with medical history significant of hypertension, hyperlipidemia, diabetes mellitus, stroke, GERD, PVD, CAD, s/p of CABG, GI bleeding, dCHF, atrial fibrillation on Eliquis, CKD-2, who presents with facial swelling and pain. CT maxillo-facial showing mild superficial subQ edema, sparing deep soft tissues. Old L MCA territory infarct. Initial vitals concerning for sepsis    Assessment & Plan:  Principal Problem:   Facial cellulitis Active Problems:   CAD (coronary artery disease)   Paroxysmal atrial fibrillation   Hyperlipidemia   S/P CABG (coronary artery bypass graft)   Stroke (HCC)   Sepsis (HCC)   Acute renal failure superimposed on stage 2 chronic kidney disease (HCC)   Chronic diastolic CHF (congestive heart failure) ([place])   Essential hypertension   Type II diabetes mellitus with renal manifestations ([place])   # Facial swelling, initial concerning for allergic reaction but clinical evolution more consistent with cellulitis. Initially febrile to 102.1 -- >defervesced since abx. BP stable.  - switch IV clinda to augmentin today (D1 abx 2/22) for total 5 day course.  - close monitoring for airway compromise (none thus far) - mupirocin ointment - cultures in process  # AKI on CKD - received total 1L fluids on 22/219 - repeat BMP tonight  # chronic diastolic CHF - although BNP in 600s, near prior level 500s in 01/2017 - did receive fluids as above - continue to monitor volume status daily - may need PO lasix pending O2 sats/volume status  # Chronic afib - resumed eliquis and metoprolol  DVT prophylaxis: on eliquis Code Status: DNR Family Communication: daughter Disposition Plan: inpatient until AKI improves, cellulitis  improvement   Consultants:  none  Procedures:  none  Antimicrobials:  clinda --> augmentin PO   Subjective: - feels upper face (forehead and maxilla) is still swollen, erythematous although thinks there is slight improvement in tenderness since receiving abx - denies itching, mucosal ulcers, lip or tongue swelling  Objective: Vitals:   06/11/17 1115 06/11/17 1145 06/11/17 1232 06/11/17 1234  BP:   139/71   Pulse: 61 62 66   Resp: (!) 28 12 (!) 22   Temp:   98 F (36.7 C)   TempSrc:      SpO2: 97% 97% 97%   Weight:    93.5 kg (206 lb 2.1 oz)  Height:    5\' 6"  (1.676 m)    Intake/Output Summary (Last 24 hours) at 06/11/2017 1649 Last data filed at 06/11/2017 1528 Gross per 24 hour  Intake 1150 ml  Output 301 ml  Net 849 ml   Filed Weights   06/10/17 1512 06/11/17 1234  Weight: 96.2 kg (212 lb) 93.5 kg (206 lb 2.1 oz)    Exam:  General exam: obese caucasian woman, occasional tearful, breathing unlabored and not in distress SKIN  Erythematous forehead and b/l maxillae, slightly tender to touch and warm. No open wounds or skin tears or lesions. No nasal drainage. Airway open, clear oropharynx. No mucosal lesions.  Respiratory system: Clear. No increased work of breathing. Cardiovascular system: S1 & S2 heard, RRR. No JVD, murmurs, gallops, clicks. 1+ non pitting edema b/l Gastrointestinal system: Abdomen is nondistended, soft and nontender. Normal bowel sounds heard. Central nervous system: Alert and oriented. No focal neurological deficits. Extremities: Symmetric 5 x 5 power.   Data Reviewed: Basic Metabolic Panel: Recent  Labs  Lab 06/10/17 1634  NA 135  K 4.6  CL 103  CO2 21*  GLUCOSE 190*  BUN 30*  CREATININE 1.52*  CALCIUM 9.4   Liver Function Tests: Recent Labs  Lab 06/10/17 1634  AST 45*  ALT 27  ALKPHOS 110  BILITOT 1.2  PROT 8.3*  ALBUMIN 3.0*   No results for input(s): LIPASE, AMYLASE in the last 168 hours. No results for input(s):  AMMONIA in the last 168 hours. CBC: Recent Labs  Lab 06/10/17 1634  WBC 14.6*  NEUTROABS 12.6*  HGB 14.1  HCT 41.2  MCV 96.5  PLT 242   Cardiac Enzymes: No results for input(s): CKTOTAL, CKMB, CKMBINDEX, TROPONINI in the last 168 hours. BNP (last 3 results) No results for input(s): PROBNP in the last 8760 hours. CBG: Recent Labs  Lab 06/10/17 2016 06/10/17 2254 06/11/17 0748 06/11/17 1223  GLUCAP 196* 320* 230* 242*    Recent Results (from the past 240 hour(s))  Blood culture (routine x 2)     Status: None (Preliminary result)   Collection Time: 06/10/17  6:50 PM  Result Value Ref Range Status   Specimen Description BLOOD LEFT ANTECUBITAL  Final   Special Requests   Final    BOTTLES DRAWN AEROBIC AND ANAEROBIC Blood Culture adequate volume   Culture   Final    NO GROWTH < 24 HOURS Performed at [facility], [place] [address] [place], [place] [address]    Report Status PENDING  Incomplete  Blood culture (routine x 2)     Status: None (Preliminary result)   Collection Time: 06/10/17  6:54 PM  Result Value Ref Range Status   Specimen Description BLOOD RIGHT FOREARM  Final   Special Requests   Final    BOTTLES DRAWN AEROBIC AND ANAEROBIC Blood Culture adequate volume   Culture   Final    NO GROWTH < 24 HOURS Performed at [facility], [place] [address], [place], [place] [address]    Report Status PENDING  Incomplete         Studies: Dg Chest 2 View  Result Date: 06/10/2017 CLINICAL DATA:  Chills and tachycardia.  Cough.  Facial swelling. EXAM: CHEST  2 VIEW COMPARISON:  01/28/2017 FINDINGS: Chronic cardiomegaly. Status post CABG. Large appearance of the central pulmonary vessels. No Kerley lines or effusion. No air bronchogram. Atherosclerotic calcification. IMPRESSION: Cardiomegaly and vascular congestion.  No edema or focal pneumonia. Electronically Signed   By: Monte Fantasia M.D.   On: 06/10/2017 16:08   Ct Maxillofacial W Contrast  Result Date:  06/10/2017 CLINICAL DATA:  Facial swelling EXAM: CT MAXILLOFACIAL WITH CONTRAST TECHNIQUE: Multidetector CT imaging of the maxillofacial structures was performed with intravenous contrast. Multiplanar CT image reconstructions were also generated. CONTRAST:  13mL ISOVUE-300 IOPAMIDOL (ISOVUE-300) INJECTION 61% COMPARISON:  Head CT 05/30/2014 FINDINGS: Osseous: The patient is edentulous. There is no facial fracture or mandibular dislocation. No focal osseous lesions. Orbits: Normal Sinuses: Clear Soft tissues: There is generalized facial swelling, most apparent in the pre maxillary face. However, there is no mass or fluid collection. There is mild skin thickening and slight hyperattenuation of the subcutaneous fat anterior to the maxilla. Limited intracranial: Vascular calcifications and old left MCA infarct. IMPRESSION: 1. Generalized facial swelling with pre maxillary mild skin thickening and mild subcutaneous edema. The findings are mild in generally nonspecific. In the appropriate context this could indicate cellulitis. 2. No edema of the deep soft tissues. 3. Old left middle cerebral artery  territory infarct. Electronically Signed   By: Ulyses Jarred M.D.   On: 06/10/2017 18:27        Scheduled Meds: . amLODipine  5 mg Oral Daily  . apixaban  5 mg Oral BID  . atorvastatin  80 mg Oral QHS  . insulin aspart  0-9 Units Subcutaneous TID WC  . insulin NPH Human  25 Units Subcutaneous BID AC & HS  . metoprolol tartrate  25 mg Oral BID  . mupirocin ointment   Topical Daily   Continuous Infusions: . clindamycin (CLEOCIN) IV Stopped (06/11/17 1528)    Principal Problem:   Facial cellulitis Active Problems:   CAD (coronary artery disease)   Paroxysmal atrial fibrillation   Hyperlipidemia   S/P CABG (coronary artery bypass graft)   Stroke (HCC)   Sepsis (HCC)   Acute renal failure superimposed on stage 2 chronic kidney disease (HCC)   Chronic diastolic CHF (congestive heart failure) ([place])    Essential hypertension   Type II diabetes mellitus with renal manifestations ([place])    Time spent: 30 mins    Colbert Ewing, MD  [facility] Pager [phone]  If 7PM-7AM, please contact night-coverage [email or web address] Password [facility] 06/11/2017, 4:49 PM    LOS: 1 day

## 2017-06-12 DIAGNOSIS — L03211 Cellulitis of face: Secondary | ICD-10-CM

## 2017-06-12 LAB — BASIC METABOLIC PANEL
ANION GAP: 9 (ref 5–15)
BUN: 40 mg/dL — ABNORMAL HIGH (ref 6–20)
CALCIUM: 8.6 mg/dL — AB (ref 8.9–10.3)
CO2: 20 mmol/L — ABNORMAL LOW (ref 22–32)
Chloride: 106 mmol/L (ref 101–111)
Creatinine, Ser: 1.71 mg/dL — ABNORMAL HIGH (ref 0.44–1.00)
GFR, EST AFRICAN AMERICAN: 32 mL/min — AB (ref >60)
GFR, EST NON AFRICAN AMERICAN: 27 mL/min — AB (ref >60)
Glucose, Bld: 213 mg/dL — ABNORMAL HIGH (ref 65–99)
Potassium: 4.1 mmol/L (ref 3.5–5.1)
Sodium: 135 mmol/L (ref 135–145)

## 2017-06-12 LAB — HEMOGLOBIN A1C
HEMOGLOBIN A1C: 6.5 % — AB (ref 4.8–5.6)
MEAN PLASMA GLUCOSE: 140 mg/dL

## 2017-06-12 LAB — CBC WITH DIFFERENTIAL/PLATELET
BASOS ABS: 0 10*3/uL (ref 0.0–0.1)
Basophils Relative: 0 %
Eosinophils Absolute: 0.3 10*3/uL (ref 0.0–0.7)
Eosinophils Relative: 3 %
HEMATOCRIT: 36.9 % (ref 36.0–46.0)
HEMOGLOBIN: 12.1 g/dL (ref 12.0–15.0)
LYMPHS PCT: 19 %
Lymphs Abs: 1.5 10*3/uL (ref 0.7–4.0)
MCH: 31.5 pg (ref 26.0–34.0)
MCHC: 32.8 g/dL (ref 30.0–36.0)
MCV: 96.1 fL (ref 78.0–100.0)
Monocytes Absolute: 0.8 10*3/uL (ref 0.1–1.0)
Monocytes Relative: 11 %
NEUTROS ABS: 5.2 10*3/uL (ref 1.7–7.7)
Neutrophils Relative %: 67 %
Platelets: 213 10*3/uL (ref 150–400)
RBC: 3.84 MIL/uL — AB (ref 3.87–5.11)
RDW: 13.6 % (ref 11.5–15.5)
WBC: 7.8 10*3/uL (ref 4.0–10.5)

## 2017-06-12 LAB — GLUCOSE, CAPILLARY
GLUCOSE-CAPILLARY: 305 mg/dL — AB (ref 65–99)
GLUCOSE-CAPILLARY: 183 mg/dL — AB (ref 65–99)
GLUCOSE-CAPILLARY: 244 mg/dL — AB (ref 65–99)
Glucose-Capillary: 202 mg/dL — ABNORMAL HIGH (ref 65–99)
Glucose-Capillary: 282 mg/dL — ABNORMAL HIGH (ref 65–99)

## 2017-06-12 LAB — CREATININE, URINE, RANDOM: Creatinine, Urine: 78.16 mg/dL

## 2017-06-12 LAB — MAGNESIUM: Magnesium: 2.3 mg/dL (ref 1.7–2.4)

## 2017-06-12 LAB — UREA NITROGEN, URINE: Urea Nitrogen, Ur: 845 mg/dL

## 2017-06-12 LAB — PROTEIN, URINE, RANDOM: Total Protein, Urine: 2400 mg/dL

## 2017-06-12 LAB — SODIUM, URINE, RANDOM: Sodium, Ur: 49 mmol/L

## 2017-06-12 MED ORDER — SODIUM CHLORIDE 0.9 % IV SOLN
INTRAVENOUS | Status: DC
  Administered 2017-06-12 – 2017-06-13 (×2): via INTRAVENOUS

## 2017-06-12 NOTE — Progress Notes (Signed)
PROGRESS NOTE    Kristina Summers  ZOX:[id number]  DOB: 1935/09/26  DOA: 06/10/2017 PCP: Kristina Blocker, MD Outpatient Specialists:   Hospital course: Kristina Summers is a 82 y.o. female with medical history significant of hypertension, hyperlipidemia, diabetes mellitus, stroke, GERD, PVD, CAD, s/p of CABG, GI bleeding, dCHF, atrial fibrillation on Eliquis, CKD-2, who presents with facial swelling and pain. CT maxillo-facial showing mild superficial subQ edema, sparing deep soft tissues. Old L MCA territory infarct. Initial vitals concerning for sepsis.  Assessment & Plan:  Principal Problem:   Facial cellulitis Active Problems:   CAD (coronary artery disease)   Paroxysmal atrial fibrillation   Hyperlipidemia   S/P CABG (coronary artery bypass graft)   Stroke (HCC)   Sepsis (HCC)   Acute renal failure superimposed on stage 2 chronic kidney disease (HCC)   Chronic diastolic CHF (congestive heart failure) ([place])   Essential hypertension   Type II diabetes mellitus with renal manifestations ([place])   # Facial swelling, initial concerning for allergic reaction but clinical evolution more consistent with cellulitis. Initially febrile to 102.1 -- >defervesced since abx. BP stable.  - switch IV clinda to augmentin today (D1 abx 2/22) for total 5 day course.  - close monitoring for airway compromise (none thus far) - mupirocin ointment - cultures in process -06/12/2017: The patient continues to report facial pain, though, the cellulitic changes seem to be improving.  Will restart IV clindamycin as well.  We will continue to assess and manage expectantly.  # AKI on CKD - received total 1L fluids on 22/219 - repeat BMP tonight -06/12/2017: We will check urine sodium, urine protein and urine creatinine.  Patient looks volume depleted.  Will start gentle hydration with IV normal saline 50 cc an hour, considering history of diastolic dysfunction and moderate aortic stenosis.  We will monitor patient's  pulmonary status closely.  # chronic diastolic CHF - although BNP in 600s, near prior level 500s in 01/2017 - did receive fluids as above - continue to monitor volume status daily - may need PO lasix pending O2 sats/volume status  # Chronic afib - resumed eliquis and metoprolol  DVT prophylaxis: on eliquis Code Status: DNR Family Communication: daughter Disposition Plan: inpatient until AKI improves, cellulitis improvement   Consultants:  none  Procedures:  none  Antimicrobials:  clinda --> augmentin PO   Subjective: -Patient continues to report facial pain. -Cellulitic change seems to be improving.    Objective: Vitals:   06/11/17 1234 06/11/17 2125 06/12/17 0457 06/12/17 1343  BP:  (!) 145/73 (!) 137/59 (!) 172/64  Pulse:  78 79 62  Resp:  17 18 (!) 22  Temp:  98.2 F (36.8 C) 98.3 F (36.8 C) 98 F (36.7 C)  TempSrc:  Oral Oral Oral  SpO2:  95% 91% 96%  Weight: 93.5 kg (206 lb 2.1 oz)  91.7 kg (202 lb 2.6 oz)   Height: 5\' 6"  (1.676 m)       Intake/Output Summary (Last 24 hours) at 06/12/2017 1706 Last data filed at 06/12/2017 1534 Gross per 24 hour  Intake 712.5 ml  Output 500 ml  Net 212.5 ml   Filed Weights   06/10/17 1512 06/11/17 1234 06/12/17 0457  Weight: 96.2 kg (212 lb) 93.5 kg (206 lb 2.1 oz) 91.7 kg (202 lb 2.6 oz)    Exam:  General exam: obese caucasian woman.  Not in any distress. SKIN: Very mild erythematous forehead and b/l maxillae, slightly tender to touch and warm. No open  wounds or skin tears or lesions. No nasal drainage. Airway open, clear oropharynx. No mucosal lesions.  Respiratory system: Clear. No increased work of breathing. Cardiovascular system: S1 & S2 heard. Gastrointestinal system: Abdomen is nondistended, soft and nontender. Normal bowel sounds heard. Central nervous system: Alert and oriented. No focal neurological deficits. Extremities: No leg edema.  Symmetric 5 x 5 power.   Data Reviewed: Basic Metabolic  Panel: Recent Labs  Lab 06/10/17 1634 06/11/17 1710 06/12/17 0235  NA 135 134* 135  K 4.6 4.2 4.1  CL 103 107 106  CO2 21* 19* 20*  GLUCOSE 190* 251* 213*  BUN 30* 35* 40*  CREATININE 1.52* 1.57* 1.71*  CALCIUM 9.4 8.5* 8.6*  MG  --   --  2.3   Liver Function Tests: Recent Labs  Lab 06/10/17 1634  AST 45*  ALT 27  ALKPHOS 110  BILITOT 1.2  PROT 8.3*  ALBUMIN 3.0*   No results for input(s): LIPASE, AMYLASE in the last 168 hours. No results for input(s): AMMONIA in the last 168 hours. CBC: Recent Labs  Lab 06/10/17 1634 06/12/17 0235  WBC 14.6* 7.8  NEUTROABS 12.6* 5.2  HGB 14.1 12.1  HCT 41.2 36.9  MCV 96.5 96.1  PLT 242 213   Cardiac Enzymes: No results for input(s): CKTOTAL, CKMB, CKMBINDEX, TROPONINI in the last 168 hours. BNP (last 3 results) No results for input(s): PROBNP in the last 8760 hours. CBG: Recent Labs  Lab 06/11/17 1223 06/11/17 1704 06/11/17 2125 06/12/17 0808 06/12/17 1211  GLUCAP 242* 253* 305* 183* 244*    Recent Results (from the past 240 hour(s))  Blood culture (routine x 2)     Status: None (Preliminary result)   Collection Time: 06/10/17  6:50 PM  Result Value Ref Range Status   Specimen Description BLOOD LEFT ANTECUBITAL  Final   Special Requests   Final    BOTTLES DRAWN AEROBIC AND ANAEROBIC Blood Culture adequate volume   Culture   Final    NO GROWTH 2 DAYS Performed at [facility], [place] [address], [place], [place] [address]    Report Status PENDING  Incomplete  Blood culture (routine x 2)     Status: None (Preliminary result)   Collection Time: 06/10/17  6:54 PM  Result Value Ref Range Status   Specimen Description BLOOD RIGHT FOREARM  Final   Special Requests   Final    BOTTLES DRAWN AEROBIC AND ANAEROBIC Blood Culture adequate volume   Culture   Final    NO GROWTH 2 DAYS Performed at [facility], [place] [address], [place], [place] [address]    Report Status PENDING  Incomplete          Studies: Ct Maxillofacial W Contrast  Result Date: 06/10/2017 CLINICAL DATA:  Facial swelling EXAM: CT MAXILLOFACIAL WITH CONTRAST TECHNIQUE: Multidetector CT imaging of the maxillofacial structures was performed with intravenous contrast. Multiplanar CT image reconstructions were also generated. CONTRAST:  53mL ISOVUE-300 IOPAMIDOL (ISOVUE-300) INJECTION 61% COMPARISON:  Head CT 05/30/2014 FINDINGS: Osseous: The patient is edentulous. There is no facial fracture or mandibular dislocation. No focal osseous lesions. Orbits: Normal Sinuses: Clear Soft tissues: There is generalized facial swelling, most apparent in the pre maxillary face. However, there is no mass or fluid collection. There is mild skin thickening and slight hyperattenuation of the subcutaneous fat anterior to the maxilla. Limited intracranial: Vascular calcifications and old left MCA infarct. IMPRESSION: 1. Generalized facial swelling with pre maxillary mild skin thickening and  mild subcutaneous edema. The findings are mild in generally nonspecific. In the appropriate context this could indicate cellulitis. 2. No edema of the deep soft tissues. 3. Old left middle cerebral artery territory infarct. Electronically Signed   By: Ulyses Jarred M.D.   On: 06/10/2017 18:27        Scheduled Meds: . amLODipine  5 mg Oral Daily  . amoxicillin-clavulanate  1 tablet Oral Q12H  . apixaban  5 mg Oral BID  . atorvastatin  80 mg Oral QHS  . insulin aspart  0-9 Units Subcutaneous TID WC  . insulin NPH Human  25 Units Subcutaneous BID AC & HS  . metoprolol tartrate  25 mg Oral BID  . mupirocin ointment   Topical Daily   Continuous Infusions: . sodium chloride 50 mL/hr at 06/12/17 1257    Principal Problem:   Facial cellulitis Active Problems:   CAD (coronary artery disease)   Paroxysmal atrial fibrillation   Hyperlipidemia   S/P CABG (coronary artery bypass graft)   Stroke (HCC)   Sepsis ([place])   Acute renal failure  superimposed on stage 2 chronic kidney disease (HCC)   Chronic diastolic CHF (congestive heart failure) ([place])   Essential hypertension   Type II diabetes mellitus with renal manifestations ([place])    Time spent: 30 mins    Bonnell Public, MD  [facility] Pager [phone].   If 7PM-7AM, please contact night-coverage [email or web address] Password [email or web address] 06/12/2017, 5:06 PM    LOS: 2 days

## 2017-06-13 LAB — CBC WITH DIFFERENTIAL/PLATELET
Basophils Absolute: 0 10*3/uL (ref 0.0–0.1)
Basophils Relative: 0 %
EOS ABS: 0.3 10*3/uL (ref 0.0–0.7)
EOS PCT: 3 %
HCT: 36.1 % (ref 36.0–46.0)
HEMOGLOBIN: 12 g/dL (ref 12.0–15.0)
LYMPHS ABS: 1.7 10*3/uL (ref 0.7–4.0)
Lymphocytes Relative: 18 %
MCH: 31.7 pg (ref 26.0–34.0)
MCHC: 33.2 g/dL (ref 30.0–36.0)
MCV: 95.5 fL (ref 78.0–100.0)
Monocytes Absolute: 0.7 10*3/uL (ref 0.1–1.0)
Monocytes Relative: 7 %
Neutro Abs: 6.8 10*3/uL (ref 1.7–7.7)
Neutrophils Relative %: 72 %
PLATELETS: 238 10*3/uL (ref 150–400)
RBC: 3.78 MIL/uL — ABNORMAL LOW (ref 3.87–5.11)
RDW: 13.2 % (ref 11.5–15.5)
WBC: 9.5 10*3/uL (ref 4.0–10.5)

## 2017-06-13 LAB — MAGNESIUM: Magnesium: 2.2 mg/dL (ref 1.7–2.4)

## 2017-06-13 LAB — RENAL FUNCTION PANEL
Albumin: 2.3 g/dL — ABNORMAL LOW (ref 3.5–5.0)
Anion gap: 5 (ref 5–15)
BUN: 33 mg/dL — ABNORMAL HIGH (ref 6–20)
CO2: 23 mmol/L (ref 22–32)
Calcium: 8.7 mg/dL — ABNORMAL LOW (ref 8.9–10.3)
Chloride: 110 mmol/L (ref 101–111)
Creatinine, Ser: 1.38 mg/dL — ABNORMAL HIGH (ref 0.44–1.00)
GFR calc Af Amer: 41 mL/min — ABNORMAL LOW (ref >60)
GFR calc non Af Amer: 35 mL/min — ABNORMAL LOW (ref >60)
Glucose, Bld: 52 mg/dL — ABNORMAL LOW (ref 65–99)
Phosphorus: 3.2 mg/dL (ref 2.5–4.6)
Potassium: 4.5 mmol/L (ref 3.5–5.1)
Sodium: 138 mmol/L (ref 135–145)

## 2017-06-13 LAB — GLUCOSE, CAPILLARY
GLUCOSE-CAPILLARY: 113 mg/dL — AB (ref 65–99)
Glucose-Capillary: 60 mg/dL — ABNORMAL LOW (ref 65–99)
Glucose-Capillary: 243 mg/dL — ABNORMAL HIGH (ref 65–99)

## 2017-06-13 MED ORDER — BACID PO TABS: 2.0000 | ORAL_TABLET | Freq: Three times a day (TID) | ORAL | 0 refills | Status: AC

## 2017-06-13 MED ORDER — BACID PO TABS
2.0000 | ORAL_TABLET | Freq: Three times a day (TID) | ORAL | Status: DC
  Filled 2017-06-13 (×2): qty 2

## 2017-06-13 MED ORDER — CLINDAMYCIN HCL 300 MG PO CAPS: 300.0000 mg | ORAL_CAPSULE | Freq: Three times a day (TID) | ORAL | 0 refills | Status: AC

## 2017-06-13 NOTE — Progress Notes (Signed)
Patient discharge teaching given, including activity, diet, follow-up appoints, and medications. Patient verbalized understanding of all discharge instructions. IV access was d/c'd. Vitals are stable. Skin is intact except as charted in most recent assessments. Pt to be escorted out by NT, to be driven home by family. 

## 2017-06-13 NOTE — Discharge Summary (Signed)
Physician Discharge Summary  Patient ID: Kristina Summers MRN: [id number] DOB/AGE: [birth year] 82 y.o.  Admit date: 06/10/2017 Discharge date: 06/13/2017  Admission Diagnoses:  Discharge Diagnoses:  Principal Problem:   Facial cellulitis Active Problems:   CAD (coronary artery disease)   Paroxysmal atrial fibrillation   Hyperlipidemia   S/P CABG (coronary artery bypass graft)   Stroke (HCC)   Sepsis (HCC)   Acute renal failure superimposed on stage 2 chronic kidney disease (HCC)   Chronic diastolic CHF (congestive heart failure) ([place])   Essential hypertension   Type II diabetes mellitus with renal manifestations ([place])   Discharged Condition: stable  Brief summary: Patient is a 82 year old female with past medical history significant forhypertension, hyperlipidemia, diabetes mellitus, stroke, GERD, PVD, CAD,s/p ofCABG, GI bleeding,dCHF, atrial fibrillationon Eliquis, CKD II.  Patient presented to the hospital with facial swelling and pain.  CT maxillo-facial showing mild superficial subcutaneous edema, sparing deep soft tissues.  Patient was admitted to the hospital and managed for facial cellulitis.  Hospital Course:  Facial cellulitis: This improved with antibiotics management.  The patient will be discharged back home on oral antibiotics to complete the course of antibiotics.    Acute kidney injury on chronic kidney disease stage III: Patient was volume depleted on presentation.  There were also concerns for possible early sepsis on presentation.  The acute kidney injury improved with hydration.  Chronic diastolic CHF: Stable.  Chronic afib:  Eliquis and metoprolol continued.   Significant Diagnostic Studies: radiology: CT maxillofacial with contrast revealed generalized facial swelling with pre maxillary mild skin thickening and mild subcutaneous edema. The findings are mild in generally nonspecific. In the appropriate context this could indicate cellulitis.  No edema of the  deep soft tissues.  Old left middle cerebral artery territory infarct.  Discharge Exam: Blood pressure (!) 164/68, pulse 90, temperature 98.8 F (37.1 C), temperature source Oral, resp. rate 18, height 5\' 6"  (1.676 m), weight 95.3 kg (210 lb 1.6 oz), SpO2 95 %.   Disposition: 01-Home or Self Care  Discharge Instructions    Call MD for:   Complete by:  As directed    Please call MD if symptoms worsen   Diet - low sodium heart healthy   Complete by:  As directed    Diet Carb Modified   Complete by:  As directed    Increase activity slowly   Complete by:  As directed      Allergies as of 06/13/2017      Reactions   Morphine And Related Nausea And Vomiting   Chest pain    Lisinopril Cough   Sertraline Hcl Nausea Only   Zoloft [sertraline Hcl] Nausea Only         Medication List    STOP taking these medications   acetaminophen 325 MG tablet Commonly known as:  TYLENOL   furosemide 40 MG tablet Commonly known as:  LASIX     TAKE these medications   amLODipine 5 MG tablet Commonly known as:  NORVASC Take 1 tablet by mouth daily.   apixaban 5 MG Tabs tablet Commonly known as:  ELIQUIS Take 1 tablet (5 mg total) by mouth 2 (two) times daily.   atorvastatin 80 MG tablet Commonly known as:  LIPITOR Take 1 tablet (80 mg total) by mouth daily. What changed:  when to take this   clindamycin 300 MG capsule Commonly known as:  CLEOCIN Take 1 capsule (300 mg total) by mouth 3 (three) times daily for 7 days.  HUMULIN R 100 units/mL injection Generic drug:  insulin regular Inject 5 units subcutaneously BEFORE BREAKFAST AND DINNER   insulin NPH Human 100 UNIT/ML injection Commonly known as:  HUMULIN N,NOVOLIN N Inject 0.38 mLs (38 Units total) into the skin 2 (two) times daily. 38 in am and 34 at night What changed:    how much to take  when to take this  additional instructions   lactobacillus acidophilus Tabs tablet Take 2 tablets by mouth 3 (three) times  daily for 21 days.   losartan 50 MG tablet Commonly known as:  COZAAR Take 1 tablet (50 mg total) by mouth daily. What changed:  when to take this   metoprolol tartrate 25 MG tablet Commonly known as:  LOPRESSOR Take 25 mg by mouth 2 (two) times daily.   mupirocin ointment 2 % Commonly known as:  BACTROBAN Apply daily to right foot ulceration What changed:  Another medication with the same name was changed. Make sure you understand how and when to take each.   mupirocin ointment 2 % Commonly known as:  BACTROBAN Apply to affected area daily. What changed:  additional instructions   NITROSTAT 0.4 MG SL tablet Generic drug:  nitroGLYCERIN Place 1 tablet under the tongue every 5 (five) minutes x 3 doses as needed for chest pain. Chest pain   ONE-A-DAY 50 PLUS PO Take 1 tablet by mouth daily.      31 minutes spent discharging the patient.  SignedBonnell Public 06/13/2017, 11:14 AM

## 2017-06-13 NOTE — Care Management Note (Signed)
Case Management Note  Patient Details  Name: Kristina Summers MRN: [id number] Date of Birth: 1935-07-29  Subjective/Objective:       Admitted with face cellulitis, hx of hypertension, hyperlipidemia, diabetes mellitus, stroke, GERD, PVD, CAD,s/p ofCABG, GI bleeding,dCHF, atrial fibrillationon Eliquis, CKD-2. From home alone. PTA independent with ADL's. DME : walker.  Kristina Summers (Daughter) Kristina Summers (Daughter)    [phone]      PCP: Dr.Eric Marlou Sa  Action/Plan: Transition to home. Family to provide transportation to home.  Expected Discharge Date:  06/13/17               Expected Discharge Plan:  Home/Self Care  In-House Referral:     Discharge planning Services  CM Consult  Post Acute Care Choice:   N/A Choice offered to:   N/A  DME Arranged:   N/A DME Agency:   N/A  HH Arranged:    N/A AAH[facility] Agency:   N/A  Status of Service:  Completed, signed off  If discussed at [place] of Stay Meetings, dates discussed:    Additional Comments:  Sharin Mons, RN 06/13/2017, 11:07 AM

## 2017-06-13 NOTE — Progress Notes (Signed)
Hypoglycemic Event  CBG: 60  Treatment: 15 GM carbohydrate snack  Symptoms: None  Follow-up CBG: [id number] CBG Result:113  Possible Reasons for Event: Inadequate meal intake     Kristina Summers

## 2017-06-15 LAB — CULTURE, BLOOD (ROUTINE X 2)
CULTURE: NO GROWTH
CULTURE: NO GROWTH
SPECIAL REQUESTS: ADEQUATE
SPECIAL REQUESTS: ADEQUATE

## 2017-06-28 ENCOUNTER — Ambulatory Visit: Payer: Medicare Other | Admitting: Sports Medicine
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
## 2017-07-04 ENCOUNTER — Ambulatory Visit (HOSPITAL_COMMUNITY)
Admission: RE | Admit: 2017-07-04 | Discharge: 2017-07-04 | Disposition: A | Discharge status: Home / Self Care | Payer: Medicare Other | Source: Ambulatory Visit | Attending: Surgery | Admitting: Surgery

## 2017-07-04 ENCOUNTER — Ambulatory Visit (INDEPENDENT_AMBULATORY_CARE_PROVIDER_SITE_OTHER)
Admission: RE | Admit: 2017-07-04 | Discharge: 2017-07-04 | Disposition: A | Discharge status: Home / Self Care | Payer: Medicare Other | Source: Ambulatory Visit | Attending: Surgery | Admitting: Surgery

## 2017-07-04 DIAGNOSIS — I70213 Atherosclerosis of native arteries of extremities with intermittent claudication, bilateral legs: Secondary | ICD-10-CM | POA: Diagnosis not present

## 2017-07-05 ENCOUNTER — Ambulatory Visit: Payer: Medicare Other | Admitting: Sports Medicine

## 2017-07-06 ENCOUNTER — Other Ambulatory Visit: Payer: Self-pay

## 2017-07-06 ENCOUNTER — Ambulatory Visit (HOSPITAL_COMMUNITY)
Admission: RE | Admit: 2017-07-06 | Discharge: 2017-07-06 | Disposition: A | Discharge status: Home / Self Care | Payer: Medicare Other | Source: Ambulatory Visit | Attending: Vascular Surgery | Admitting: Vascular Surgery

## 2017-07-06 ENCOUNTER — Encounter: Payer: Self-pay | Admitting: Vascular Surgery

## 2017-07-06 ENCOUNTER — Ambulatory Visit (INDEPENDENT_AMBULATORY_CARE_PROVIDER_SITE_OTHER): Payer: Medicare Other | Admitting: Vascular Surgery

## 2017-07-06 VITALS — BP 194/91 | HR 78 | Resp 18 | Ht 66.0 in | Wt 212.4 lb

## 2017-07-06 DIAGNOSIS — I70213 Atherosclerosis of native arteries of extremities with intermittent claudication, bilateral legs: Secondary | ICD-10-CM | POA: Diagnosis present

## 2017-07-07 ENCOUNTER — Other Ambulatory Visit: Payer: Self-pay

## 2017-07-07 DIAGNOSIS — I70213 Atherosclerosis of native arteries of extremities with intermittent claudication, bilateral legs: Secondary | ICD-10-CM

## 2017-07-07 DIAGNOSIS — Z48812 Encounter for surgical aftercare following surgery on the circulatory system: Secondary | ICD-10-CM

## 2017-07-08 ENCOUNTER — Other Ambulatory Visit: Payer: Self-pay

## 2017-07-08 ENCOUNTER — Emergency Department (HOSPITAL_COMMUNITY): Payer: Medicare Other

## 2017-07-08 ENCOUNTER — Encounter (HOSPITAL_COMMUNITY): Payer: Self-pay | Admitting: Emergency Medicine

## 2017-07-08 ENCOUNTER — Emergency Department (HOSPITAL_COMMUNITY)
Admission: EM | Admit: 2017-07-08 | Discharge: 2017-07-08 | Disposition: A | Discharge status: Home / Self Care | Payer: Medicare Other | Attending: Emergency Medicine | Admitting: Emergency Medicine

## 2017-07-08 DIAGNOSIS — Z7901 Long term (current) use of anticoagulants: Secondary | ICD-10-CM | POA: Insufficient documentation

## 2017-07-08 DIAGNOSIS — R0609 Other forms of dyspnea: Secondary | ICD-10-CM | POA: Insufficient documentation

## 2017-07-08 DIAGNOSIS — E785 Hyperlipidemia, unspecified: Secondary | ICD-10-CM | POA: Diagnosis not present

## 2017-07-08 DIAGNOSIS — I110 Hypertensive heart disease with heart failure: Secondary | ICD-10-CM | POA: Insufficient documentation

## 2017-07-08 DIAGNOSIS — I252 Old myocardial infarction: Secondary | ICD-10-CM | POA: Diagnosis not present

## 2017-07-08 DIAGNOSIS — I2510 Atherosclerotic heart disease of native coronary artery without angina pectoris: Secondary | ICD-10-CM | POA: Diagnosis not present

## 2017-07-08 DIAGNOSIS — I10 Essential (primary) hypertension: Secondary | ICD-10-CM

## 2017-07-08 DIAGNOSIS — E119 Type 2 diabetes mellitus without complications: Secondary | ICD-10-CM | POA: Insufficient documentation

## 2017-07-08 DIAGNOSIS — N3001 Acute cystitis with hematuria: Secondary | ICD-10-CM | POA: Insufficient documentation

## 2017-07-08 DIAGNOSIS — I5032 Chronic diastolic (congestive) heart failure: Secondary | ICD-10-CM | POA: Diagnosis not present

## 2017-07-08 DIAGNOSIS — I4891 Unspecified atrial fibrillation: Secondary | ICD-10-CM | POA: Diagnosis not present

## 2017-07-08 DIAGNOSIS — Z79899 Other long term (current) drug therapy: Secondary | ICD-10-CM | POA: Diagnosis not present

## 2017-07-08 DIAGNOSIS — Z8673 Personal history of transient ischemic attack (TIA), and cerebral infarction without residual deficits: Secondary | ICD-10-CM | POA: Insufficient documentation

## 2017-07-08 DIAGNOSIS — Z794 Long term (current) use of insulin: Secondary | ICD-10-CM | POA: Diagnosis not present

## 2017-07-08 DIAGNOSIS — M436 Torticollis: Secondary | ICD-10-CM | POA: Insufficient documentation

## 2017-07-08 DIAGNOSIS — R51 Headache: Secondary | ICD-10-CM | POA: Diagnosis present

## 2017-07-08 DIAGNOSIS — Z87891 Personal history of nicotine dependence: Secondary | ICD-10-CM | POA: Insufficient documentation

## 2017-07-08 LAB — BASIC METABOLIC PANEL
ANION GAP: 11 (ref 5–15)
BUN: 25 mg/dL — ABNORMAL HIGH (ref 6–20)
CALCIUM: 8.9 mg/dL (ref 8.9–10.3)
CO2: 22 mmol/L (ref 22–32)
CREATININE: 1.2 mg/dL — AB (ref 0.44–1.00)
Chloride: 103 mmol/L (ref 101–111)
GFR, EST AFRICAN AMERICAN: 48 mL/min — AB (ref >60)
GFR, EST NON AFRICAN AMERICAN: 42 mL/min — AB (ref >60)
Glucose, Bld: 102 mg/dL — ABNORMAL HIGH (ref 65–99)
Potassium: 3.6 mmol/L (ref 3.5–5.1)
SODIUM: 136 mmol/L (ref 135–145)

## 2017-07-08 LAB — CBC WITH DIFFERENTIAL/PLATELET
BASOS ABS: 0 10*3/uL (ref 0.0–0.1)
BASOS PCT: 1 %
EOS ABS: 0.4 10*3/uL (ref 0.0–0.7)
Eosinophils Relative: 5 %
HCT: 37 % (ref 36.0–46.0)
Hemoglobin: 12.3 g/dL (ref 12.0–15.0)
Lymphocytes Relative: 17 %
Lymphs Abs: 1.3 10*3/uL (ref 0.7–4.0)
MCH: 32.4 pg (ref 26.0–34.0)
MCHC: 33.2 g/dL (ref 30.0–36.0)
MCV: 97.4 fL (ref 78.0–100.0)
MONO ABS: 0.9 10*3/uL (ref 0.1–1.0)
MONOS PCT: 11 %
Neutro Abs: 5.2 10*3/uL (ref 1.7–7.7)
Neutrophils Relative %: 66 %
PLATELETS: 258 10*3/uL (ref 150–400)
RBC: 3.8 MIL/uL — ABNORMAL LOW (ref 3.87–5.11)
RDW: 14.3 % (ref 11.5–15.5)
WBC: 7.7 10*3/uL (ref 4.0–10.5)

## 2017-07-08 LAB — URINALYSIS, ROUTINE W REFLEX MICROSCOPIC
BILIRUBIN URINE: NEGATIVE
Glucose, UA: NEGATIVE mg/dL
Ketones, ur: NEGATIVE mg/dL
Nitrite: POSITIVE — AB
PH: 7 (ref 5.0–8.0)
Protein, ur: >=300 mg/dL — AB
SPECIFIC GRAVITY, URINE: 1.01 (ref 1.005–1.030)

## 2017-07-08 LAB — I-STAT TROPONIN, ED
TROPONIN I, POC: 0.03 ng/mL (ref 0.00–0.08)
TROPONIN I, POC: 0.02 ng/mL (ref 0.00–0.08)
Troponin i, poc: 0 ng/mL (ref 0.00–0.08)

## 2017-07-08 LAB — BRAIN NATRIURETIC PEPTIDE: B NATRIURETIC PEPTIDE 5: 396.3 pg/mL — AB (ref 0.0–100.0)

## 2017-07-08 MED ORDER — METOPROLOL TARTRATE 25 MG PO TABS
25.0000 mg | ORAL_TABLET | Freq: Once | ORAL | Status: AC
  Administered 2017-07-08: 25 mg via ORAL
  Filled 2017-07-08: qty 1

## 2017-07-08 MED ORDER — HYDROCODONE-ACETAMINOPHEN 5-325 MG PO TABS: 1.0000 | ORAL_TABLET | Freq: Four times a day (QID) | ORAL | 0 refills | Status: DC | PRN

## 2017-07-08 MED ORDER — AMLODIPINE BESYLATE 5 MG PO TABS
5.0000 mg | ORAL_TABLET | Freq: Once | ORAL | Status: AC
  Administered 2017-07-08: 5 mg via ORAL
  Filled 2017-07-08: qty 1

## 2017-07-08 MED ORDER — ONDANSETRON 4 MG PO TBDP: 4.0000 mg | ORAL_TABLET | Freq: Four times a day (QID) | ORAL | 0 refills | Status: DC | PRN

## 2017-07-08 MED ORDER — LOSARTAN POTASSIUM 50 MG PO TABS
50.0000 mg | ORAL_TABLET | Freq: Once | ORAL | Status: AC
  Administered 2017-07-08: 50 mg via ORAL
  Filled 2017-07-08: qty 1

## 2017-07-08 MED ORDER — FENTANYL CITRATE (PF) 100 MCG/2ML IJ SOLN
50.0000 ug | Freq: Once | INTRAMUSCULAR | Status: AC
  Administered 2017-07-08: 50 ug via INTRAVENOUS
  Filled 2017-07-08: qty 2

## 2017-07-08 MED ORDER — CEPHALEXIN 500 MG PO CAPS: 500.0000 mg | ORAL_CAPSULE | Freq: Two times a day (BID) | ORAL | 0 refills | Status: DC

## 2017-07-08 MED ORDER — ONDANSETRON HCL 4 MG/2ML IJ SOLN
4.0000 mg | Freq: Once | INTRAMUSCULAR | Status: AC
  Administered 2017-07-08: 4 mg via INTRAVENOUS
  Filled 2017-07-08: qty 2

## 2017-07-08 MED ORDER — SODIUM CHLORIDE 0.9 % IV SOLN
1.0000 g | Freq: Once | INTRAVENOUS | Status: AC
  Administered 2017-07-08: 1 g via INTRAVENOUS
  Filled 2017-07-08: qty 10

## 2017-07-08 NOTE — ED Provider Notes (Signed)
Care assumed from previous provider Dr. Leonides Schanz. Please see their note for further details to include full history and physical. To summarize in short pt is a 82 year old female past medical history significant for hypertension, hyperlipidemia, diabetes, A. fib on Eliquis, chronic kidney disease, CAD presents to the ED with complaints of headache and posterior neck pain that is worse with turning.  Pain was reproducible on palpation.  Patient noted to be hypertensive in the ED however this has improved with blood pressure medicines.. Case discussed, plan agreed upon.  On repeat assessment was awaiting second delta troponin and UA.  Negative delta troponin.  Patient states that she feels significantly improved has no for the pain at this time.  Patient's urine does show signs of infection with nitrites, WBCs, many bacteria.  We will treat patient with 1 g of Rocephin in the ED and discharged home on Keflex.  Pt is hemodynamically stable, in NAD, & able to ambulate in the ED. Evaluation does not show pathology that would require ongoing emergent intervention or inpatient treatment. I explained the diagnosis to the patient. Pain has been managed & has no complaints prior to dc. Pt is comfortable with above plan and is stable for discharge at this time. All questions were answered prior to disposition. Strict return precautions for f/u to the ED were discussed. Encouraged follow up with PCP.        Doristine Devoid, PA-C 07/08/17 1409

## 2017-07-08 NOTE — Discharge Instructions (Addendum)
Your blood work, EKG, chest x-ray and head CT were reassuring today.  You likely have muscle spasms causing your neck pain and headache.  Please continue to take your blood pressure medication and diabetes medications as prescribed.  We have sent you home with a prescription of Vicodin for pain control.  Please note that this is a narcotic pain medication and can make you drowsy.  Please only take this medication as needed.  Please do not drive when taking this medication.  Please note that each tablet of Vicodin has 325 mg of Tylenol in it.  If you plan to take extra Tylenol, please note that you should not take more than 4000 mg of Tylenol in a 24-hour period.  Signs of urinary tract infection.  Please take antibiotic as prescribed.  Follow-up with your primary care doctor for further assessment next week and return to the ED with any worsening symptoms.

## 2017-07-08 NOTE — ED Provider Notes (Addendum)
TIME SEEN: 6:00 AM  CHIEF COMPLAINT: Headache, shortness of breath  HPI: Patient is a 82 year old female with history of hypertension, hyperlipidemia, diabetes, atrial fibrillation on Eliquis, chronic kidney disease, coronary artery disease who presents to the emergency department with complaints of the posterior headache that goes into her neck that is worse with turning her head to the right and left that started 3 days ago.  Has been taking Tylenol 4 times a day with very minimal relief.  She states that she has never had a similar headache before.  States that she woke up with her headache one morning and felt like it was worse when she tried to lift her head off the pillow.  Denies any head injury.  No fever.  EMS reports patient has been noncompliant with blood pressure medication but she denies this.  She states she has been taking her Lasix, amlodipine, losartan and Lopressor.  She did not take her blood pressure or diabetes medications this morning prior to calling EMS.  With EMS her blood pressure was 220/90.  She states this is much higher than she normally runs.  She denies any chest pain or chest discomfort.  She is having some shortness of breath over the past day that is worse with exertion.  States this is not as bad as when she has had a CHF exacerbation before.  No leg swelling.  Has also noticed some blurry vision over the past 2 days but no vision loss.  Has some mild aphasia from her previous stroke.  No numbness, tingling or focal weakness that is new.  States the headache was gradual in onset.  No sudden onset, thunderclap, worst headache of her life.  ROS: See HPI Constitutional: no fever  Eyes: no drainage  ENT: no runny nose   Cardiovascular:  no chest pain  Resp: no SOB  GI: no vomiting GU: no dysuria Integumentary: no rash  Allergy: no hives  Musculoskeletal: no leg swelling  Neurological: no slurred speech ROS otherwise negative  PAST MEDICAL HISTORY/PAST SURGICAL  HISTORY:  Past Medical History:  Diagnosis Date  . Angina   . Arthritis   . Atrial fibrillation ([place])   . CAD (coronary artery disease)   . Carotid artery occlusion   . Diabetes mellitus   . Diastolic CHF ([place]) 56/3875  . Fibromyalgia   . GERD (gastroesophageal reflux disease)   . GI bleed 1/11  . Hyperlipidemia   . Hypertension   . Myocardial infarction ([place]) ~ 1991  . Normal nuclear stress test 03/18/2011  . Obesity   . Peripheral vascular disease ([place])   . Stroke [facility])    May 24, 2014  . UTI (lower urinary tract infection) Sept. 2015    MEDICATIONS:  Prior to Admission medications   Medication Sig Start Date End Date Taking? Authorizing Provider  amLODipine (NORVASC) 5 MG tablet Take 1 tablet by mouth daily. 03/06/12   [provider]  apixaban (ELIQUIS) 5 MG TABS tablet Take 1 tablet (5 mg total) by mouth 2 (two) times daily. 06/03/14   Rai, Vernelle Emerald, MD  atorvastatin (LIPITOR) 80 MG tablet Take 1 tablet (80 mg total) by mouth daily. Patient taking differently: Take 80 mg by mouth at bedtime.  11/27/12   Baker, Freeman Caldron, PA-C  HUMULIN R 100 UNIT/ML injection Inject 5 units subcutaneously BEFORE BREAKFAST AND DINNER 11/26/16   [provider]  insulin NPH Human (HUMULIN N,NOVOLIN N) 100 UNIT/ML injection Inject 0.38 mLs (38 Units total) into the  skin 2 (two) times daily. 38 in am and 34 at night Patient taking differently: Inject 37 Units into the skin 2 (two) times daily before a meal.  06/03/14   Rai, Ripudeep K, MD  losartan (COZAAR) 50 MG tablet Take 1 tablet (50 mg total) by mouth daily. Patient taking differently: Take 50 mg by mouth 2 (two) times daily.  06/03/14   Rai, Vernelle Emerald, MD  metoprolol tartrate (LOPRESSOR) 25 MG tablet Take 25 mg by mouth 2 (two) times daily.  04/16/14   [provider]  Multiple Vitamins-Minerals (ONE-A-DAY 50 PLUS PO) Take 1 tablet by mouth daily.    [provider]  mupirocin ointment (BACTROBAN) 2 %  Apply daily to right foot ulceration 03/15/17   Landis Martins, DPM  mupirocin ointment (BACTROBAN) 2 % Apply to affected area daily. Patient not taking: Reported on 07/06/2017 05/25/17   Landis Martins, DPM  NITROSTAT 0.4 MG SL tablet Place 1 tablet under the tongue every 5 (five) minutes x 3 doses as needed for chest pain. Chest pain 03/07/14   [provider]    ALLERGIES:  Allergies  Allergen Reactions  . Morphine And Related Nausea And Vomiting    Chest pain    . Lisinopril Cough  . Sertraline Hcl Nausea Only  . Zoloft [Sertraline Hcl] Nausea Only         SOCIAL HISTORY:  Social History   Tobacco Use  . Smoking status: Former Smoker    Packs/day: 2.00    Years: 30.00    Pack years: 60.00    Types: Cigarettes    Last attempt to quit: 04/19/1990    Years since quitting: 27.2  . Smokeless tobacco: Never Used  . Tobacco comment: stopped smoking cigarettes 1991  Substance Use Topics  . Alcohol use: No    Alcohol/week: 0.0 oz    FAMILY HISTORY: Family History  Problem Relation Age of Onset  . Other Brother        intestinal blockage  . Diabetes Brother     EXAM: BP (!) 182/88   Temp 97.8 F (36.6 C) (Oral)   Resp 13   Ht 5\' 6"  (1.676 m)   Wt 96.2 kg (212 lb)   SpO2 100%   BMI 34.22 kg/m  CONSTITUTIONAL: Alert and oriented and responds appropriately to questions.  Elderly, obese, able to answer questions but is slow to do so secondary to her aphasia HEAD: Normocephalic EYES: Conjunctivae clear, pupils appear equal, EOMI ENT: normal nose; moist mucous membranes NECK: Supple, no meningismus, no nuchal rigidity, no LAD, she is tender to palpation over the bilateral paraspinal muscles but no significant occipital tenderness, no midline spinal tenderness or step-off or deformity, pain with turning her head to the right and left which reproduces her headache CARD: RRR; S1 and S2 appreciated; no murmurs, no clicks, no rubs, no gallops RESP: Normal chest  excursion without splinting or tachypnea; breath sounds clear and equal bilaterally; no wheezes, no rhonchi, no rales, no hypoxia or respiratory distress, speaking full sentences ABD/GI: Normal bowel sounds; non-distended; soft, non-tender, no rebound, no guarding, no peritoneal signs, no hepatosplenomegaly BACK:  The back appears normal and is non-tender to palpation, there is no CVA tenderness EXT: Normal ROM in all joints; non-tender to palpation; no edema; normal capillary refill; no cyanosis, no calf tenderness or swelling    SKIN: Normal color for age and race; warm; no rash NEURO: Moves all extremities equally, no pronator drift, sensation to light touch intact diffusely,  cranial nerves II through XII intact, speech is slow but no dysarthria PSYCH: The patient's mood and manner are appropriate. Grooming and personal hygiene are appropriate.  MEDICAL DECISION MAKING: Patient here with complaints of headache.  I suspect that this is likely related to torticollis, neck strain.  She seems to have a lot of pain over her trapezius muscles bilaterally and where they insert in the occiput.  I do not think she is having occipital neuralgia.  I do not think this is an intracranial hemorrhage or stroke but given she is on Eliquis and is elderly and will obtain a CT of her head without contrast.  We will treat symptomatically with IV fentanyl.  She is neurologically intact at this time other than very minimal aphasia which is chronic from her previous stroke.  She does report feeling short of breath for the past few days.  Will obtain cardiac labs, EKG and chest x-ray.  Her blood pressure is mildly elevated but slowly improving on its own.  I will go ahead and order her home blood pressure medications.  We will also check her blood sugar here she states it was low at home and she took 4 glucose tablets prior to getting on the ambulance.  She is not sure what it was at home and is not sure what her CBG was with  EMS.  She denies any recent infectious symptoms.  She has been admitted before for sepsis but I do not think this is because of her symptoms today.  I do not think she has meningitis.  She is afebrile here.  ED PROGRESS: Patient's workup has been unremarkable.  Mildly elevated creatinine which appears to be chronic and actually better than previous.  Troponin is negative.  Chest x-ray clear.  She has cardiomegaly which appears chronic.  Head CT shows no acute abnormality.  Has remote left MCA infarct.  Blood pressure slowly improving.  Plan is to obtain urinalysis and repeat second troponin at 9:15 AM.  If workup unremarkable, blood pressure improved and pain is better, I feel patient is safe to be discharged home.  Reports pain almost completely resolved and she is feeling much better.  Blood pressure is now 153/63.  She agrees with above plan and feels comfortable with discharge home.  Will discharge with short course of Vicodin to take as needed for pain control at home.  Signed out to oncoming EDP to follow up on second troponin and UA.  I reviewed all nursing notes, vitals, pertinent previous records, EKGs, lab and urine results, imaging (as available).      EKG Interpretation  Date/Time:  Friday July 08 2017 05:51:39 EDT Ventricular Rate:  65 PR Interval:    QRS Duration: 126 QT Interval:  438 QTC Calculation: 456 R Axis:   -154 Text Interpretation:  Atrial fibrillation Nonspecific intraventricular conduction delay No significant change since last tracing Confirmed by Ward, Cyril Mourning [phone]) on 07/08/2017 5:55:27 AM         Ward, Delice Bison, DO 07/08/17 [place], Delice Bison, DO 07/08/17 [id number]

## 2017-07-08 NOTE — ED Triage Notes (Signed)
Per EMS, pt coming from home with complaints of an headache and neck pain for the past three days. Pt BP is 220/90 on arrival. Pt states that the pressure is worse when she turns her head but denies any vision changes. Pt has been non complient with meds at home.

## 2017-07-08 NOTE — ED Notes (Signed)
Patient transported to CT 

## 2017-07-08 NOTE — ED Notes (Signed)
Patient verbalizes understanding of discharge instructions. Opportunity for questioning and answers were provided. Armband removed by staff, pt discharged from ED ambulatory.   

## 2017-07-19 ENCOUNTER — Other Ambulatory Visit: Payer: Self-pay

## 2017-07-19 ENCOUNTER — Ambulatory Visit: Payer: Medicare Other | Admitting: Sports Medicine

## 2017-07-29 ENCOUNTER — Encounter (HOSPITAL_COMMUNITY): Payer: Self-pay | Admitting: *Deleted

## 2017-07-29 ENCOUNTER — Emergency Department (HOSPITAL_COMMUNITY)
Admission: EM | Admit: 2017-07-29 | Discharge: 2017-07-29 | Disposition: A | Discharge status: Home / Self Care | Payer: Medicare Other | Attending: Emergency Medicine | Admitting: Emergency Medicine

## 2017-07-29 ENCOUNTER — Emergency Department (HOSPITAL_BASED_OUTPATIENT_CLINIC_OR_DEPARTMENT_OTHER)
Admit: 2017-07-29 | Discharge: 2017-07-29 | Disposition: A | Discharge status: Home / Self Care | Payer: Medicare Other | Attending: Emergency Medicine | Admitting: Emergency Medicine

## 2017-07-29 ENCOUNTER — Other Ambulatory Visit: Payer: Self-pay

## 2017-07-29 DIAGNOSIS — L03115 Cellulitis of right lower limb: Secondary | ICD-10-CM | POA: Diagnosis not present

## 2017-07-29 DIAGNOSIS — R609 Edema, unspecified: Secondary | ICD-10-CM | POA: Diagnosis not present

## 2017-07-29 DIAGNOSIS — R2243 Localized swelling, mass and lump, lower limb, bilateral: Secondary | ICD-10-CM | POA: Diagnosis present

## 2017-07-29 DIAGNOSIS — L03116 Cellulitis of left lower limb: Secondary | ICD-10-CM | POA: Diagnosis not present

## 2017-07-29 DIAGNOSIS — I5032 Chronic diastolic (congestive) heart failure: Secondary | ICD-10-CM | POA: Diagnosis not present

## 2017-07-29 DIAGNOSIS — Z794 Long term (current) use of insulin: Secondary | ICD-10-CM | POA: Insufficient documentation

## 2017-07-29 DIAGNOSIS — N182 Chronic kidney disease, stage 2 (mild): Secondary | ICD-10-CM | POA: Diagnosis not present

## 2017-07-29 DIAGNOSIS — M79609 Pain in unspecified limb: Secondary | ICD-10-CM | POA: Diagnosis not present

## 2017-07-29 DIAGNOSIS — Z951 Presence of aortocoronary bypass graft: Secondary | ICD-10-CM | POA: Insufficient documentation

## 2017-07-29 DIAGNOSIS — I252 Old myocardial infarction: Secondary | ICD-10-CM | POA: Diagnosis not present

## 2017-07-29 DIAGNOSIS — Z7901 Long term (current) use of anticoagulants: Secondary | ICD-10-CM | POA: Insufficient documentation

## 2017-07-29 DIAGNOSIS — Z79899 Other long term (current) drug therapy: Secondary | ICD-10-CM | POA: Diagnosis not present

## 2017-07-29 DIAGNOSIS — Z8673 Personal history of transient ischemic attack (TIA), and cerebral infarction without residual deficits: Secondary | ICD-10-CM | POA: Insufficient documentation

## 2017-07-29 DIAGNOSIS — Z87891 Personal history of nicotine dependence: Secondary | ICD-10-CM | POA: Insufficient documentation

## 2017-07-29 DIAGNOSIS — E1122 Type 2 diabetes mellitus with diabetic chronic kidney disease: Secondary | ICD-10-CM | POA: Diagnosis not present

## 2017-07-29 DIAGNOSIS — M7989 Other specified soft tissue disorders: Secondary | ICD-10-CM

## 2017-07-29 DIAGNOSIS — I2510 Atherosclerotic heart disease of native coronary artery without angina pectoris: Secondary | ICD-10-CM | POA: Insufficient documentation

## 2017-07-29 DIAGNOSIS — I130 Hypertensive heart and chronic kidney disease with heart failure and stage 1 through stage 4 chronic kidney disease, or unspecified chronic kidney disease: Secondary | ICD-10-CM | POA: Diagnosis not present

## 2017-07-29 LAB — COMPREHENSIVE METABOLIC PANEL
ALK PHOS: 128 U/L — AB (ref 38–126)
ALT: 23 U/L (ref 14–54)
ANION GAP: 8 (ref 5–15)
AST: 31 U/L (ref 15–41)
Albumin: 2.6 g/dL — ABNORMAL LOW (ref 3.5–5.0)
BUN: 22 mg/dL — ABNORMAL HIGH (ref 6–20)
CALCIUM: 9.1 mg/dL (ref 8.9–10.3)
CO2: 26 mmol/L (ref 22–32)
CREATININE: 1.06 mg/dL — AB (ref 0.44–1.00)
Chloride: 106 mmol/L (ref 101–111)
GFR, EST AFRICAN AMERICAN: 56 mL/min — AB (ref >60)
GFR, EST NON AFRICAN AMERICAN: 49 mL/min — AB (ref >60)
Glucose, Bld: 132 mg/dL — ABNORMAL HIGH (ref 65–99)
Potassium: 3.5 mmol/L (ref 3.5–5.1)
SODIUM: 140 mmol/L (ref 135–145)
Total Bilirubin: 0.5 mg/dL (ref 0.3–1.2)
Total Protein: 7.3 g/dL (ref 6.5–8.1)

## 2017-07-29 LAB — CBC WITH DIFFERENTIAL/PLATELET
BASOS ABS: 0 10*3/uL (ref 0.0–0.1)
BASOS PCT: 0 %
EOS ABS: 0.4 10*3/uL (ref 0.0–0.7)
Eosinophils Relative: 4 %
HEMATOCRIT: 36.2 % (ref 36.0–46.0)
HEMOGLOBIN: 12 g/dL (ref 12.0–15.0)
Lymphocytes Relative: 15 %
Lymphs Abs: 1.5 10*3/uL (ref 0.7–4.0)
MCH: 31.6 pg (ref 26.0–34.0)
MCHC: 33.1 g/dL (ref 30.0–36.0)
MCV: 95.3 fL (ref 78.0–100.0)
Monocytes Absolute: 0.7 10*3/uL (ref 0.1–1.0)
Monocytes Relative: 7 %
NEUTROS ABS: 6.8 10*3/uL (ref 1.7–7.7)
NEUTROS PCT: 74 %
Platelets: 257 10*3/uL (ref 150–400)
RBC: 3.8 MIL/uL — ABNORMAL LOW (ref 3.87–5.11)
RDW: 13.7 % (ref 11.5–15.5)
WBC: 9.4 10*3/uL (ref 4.0–10.5)

## 2017-07-29 LAB — I-STAT CG4 LACTIC ACID, ED: LACTIC ACID, VENOUS: 0.72 mmol/L (ref 0.5–1.9)

## 2017-07-29 MED ORDER — CLINDAMYCIN HCL 150 MG PO CAPS: 450.0000 mg | ORAL_CAPSULE | Freq: Three times a day (TID) | ORAL | 0 refills | Status: AC

## 2017-07-29 NOTE — Progress Notes (Signed)
*  Preliminary Results* Bilateral lower extremity venous duplex completed. Bilateral lower extremities are negative for deep vein thrombosis. There is a 2.7x1.89x2.02cm complex fluid collection at right popliteal fossa medially.   07/29/2017 3:44 PM [place], RVT, RDMS

## 2017-07-29 NOTE — ED Provider Notes (Signed)
[facility] EMERGENCY DEPARTMENT Provider Note   CSN: [id number] Arrival date & time: 07/29/17  1021     History   Chief Complaint Chief Complaint  Patient presents with  . Leg Swelling    HPI Kristina Summers is a 82 y.o. female w/o DM, HTN, HLD, CAD s/p MI and CABG, heart failure, a-fib on eliquis, CVA with aphasia, PAD, renal insufficiency here for evaluation of bilateral LE erythema, pain, warmth and swelling L>R x 2 days.  Has history of diabetic neuropathy, unchanged.   had facial cellulitis last month and feels like symptoms today feel similar.  No IV drug use.  No interventions PTA.   aggravating factors include palpation and walking.  She denies fevers, chills, generalized malaise, chest pain, shortness of breath, cough.  HPI  Past Medical History:  Diagnosis Date  . Angina   . Arthritis   . Atrial fibrillation ([place])   . CAD (coronary artery disease)   . Carotid artery occlusion   . Diabetes mellitus   . Diastolic CHF ([place]) 16/0737  . Fibromyalgia   . GERD (gastroesophageal reflux disease)   . GI bleed 1/11  . Hyperlipidemia   . Hypertension   . Myocardial infarction ([place]) ~ 1991  . Normal nuclear stress test 03/18/2011  . Obesity   . Peripheral vascular disease ([place])   . Stroke [facility])    May 24, 2014  . UTI (lower urinary tract infection) Sept. 2015    Patient Active Problem List   Diagnosis Date Noted  . Sepsis ([place]) 06/10/2017  . Facial cellulitis 06/10/2017  . Acute renal failure superimposed on stage 2 chronic kidney disease ([place]) 06/10/2017  . Chronic diastolic CHF (congestive heart failure) ([place]) 06/10/2017  . Essential hypertension 06/10/2017  . Type II diabetes mellitus with renal manifestations ([place]) 06/10/2017  . Acute exacerbation of Diastolic CHF (congestive heart failure) /EF 60 % 01/28/2017  . Cerebral infarction ([place]) April 05, 202016  . Cerebral embolism with cerebral infarction ([place]) April 05, 202016  . Fall   . Stroke ([place])   .  UTI (lower urinary tract infection) 05/29/2014  . Fracture of distal end of right fibula 05/29/2014  . Rhabdomyolysis 05/29/2014  . Transaminitis 05/29/2014  . Weakness of both lower extremities 01/11/2014  . Pain of left lower extremity- and Right 01/11/2014  . Aftercare following surgery of the circulatory system, [place] 01/11/2014  . Peripheral vascular disease, unspecified ([place]) 01/11/2014  . Cellulitis of great toe, right 08/23/2013  . CAD (coronary artery disease)   . Hypertensive heart disease without CHF   . Paroxysmal atrial fibrillation   . Diabetes mellitus type 2 with peripheral artery disease ([place])   . Hyperlipidemia   . Atherosclerosis of native arteries of extremity with intermittent claudication ([place])   . History of GI bleed 04/21/2009  . S/P CABG (coronary artery bypass graft) 11/20/1998    Past Surgical History:  Procedure Laterality Date  . ABDOMINAL AORTAGRAM N/A 04/01/2011   Procedure: ABDOMINAL AORTAGRAM;  Surgeon: Conrad [place], MD;  Location: [facility] CATH LAB;  Service: Cardiovascular;  Laterality: N/A;  . ANGIOPLASTY  06/17/11   Left leg common femoral artery cannulation under u/s Left leg runoff  . CATARACT EXTRACTION W/ INTRAOCULAR LENS  IMPLANT, BILATERAL  2004-2005  . CORONARY ARTERY BYPASS GRAFT  2000   CABG X5  . EYE SURGERY    . LOWER EXTREMITY ANGIOGRAM Bilateral 04/01/2011   Procedure: LOWER EXTREMITY ANGIOGRAM;  Surgeon: Conrad [place], MD;  Location: [facility] CATH LAB;  Service:  Cardiovascular;  Laterality: Bilateral;  . LOWER EXTREMITY ANGIOGRAM Left 06/17/2011   Procedure: LOWER EXTREMITY ANGIOGRAM;  Surgeon: Conrad [place], MD;  Location: [facility] CATH LAB;  Service: Cardiovascular;  Laterality: Left;  . LOWER EXTREMITY ANGIOGRAM N/A 11/18/2011   Procedure: LOWER EXTREMITY ANGIOGRAM;  Surgeon: Conrad [place], MD;  Location: [facility] CATH LAB;  Service: Cardiovascular;  Laterality: N/A;  . PERCUTANEOUS STENT INTERVENTION Right 04/01/2011   Procedure: PERCUTANEOUS STENT  INTERVENTION;  Surgeon: Conrad , MD;  Location: [facility] CATH LAB;  Service: Cardiovascular;  Laterality: Right;  rt iliac stent  . PERIPHERAL ARTERIAL STENT GRAFT  04/01/11   right common iliac  . TRIGGER FINGER RELEASE  1996   right thumb     OB History   None      Home Medications    Prior to Admission medications   Medication Sig Start Date End Date Taking? Authorizing Provider  acetaminophen (TYLENOL) 325 MG tablet Take 650 mg by mouth every 6 (six) hours as needed for headache.    [provider]  amLODipine (NORVASC) 5 MG tablet Take 1 tablet by mouth daily. 03/06/12   [provider]  apixaban (ELIQUIS) 5 MG TABS tablet Take 1 tablet (5 mg total) by mouth 2 (two) times daily. 06/03/14   Rai, Vernelle Emerald, MD  atorvastatin (LIPITOR) 80 MG tablet Take 1 tablet (80 mg total) by mouth daily. Patient taking differently: Take 80 mg by mouth at bedtime.  11/27/12   Luana Shu, Freeman Caldron, PA-C  cephALEXin (KEFLEX) 500 MG capsule Take 1 capsule (500 mg total) by mouth 2 (two) times daily. 07/08/17   Doristine Devoid, PA-C  clindamycin (CLEOCIN) 150 MG capsule Take 3 capsules (450 mg total) by mouth 3 (three) times daily for 7 days. 07/29/17 08/05/17  Kinnie Feil, PA-C  furosemide (LASIX) 40 MG tablet Take 40 mg by mouth 2 (two) times daily. 06/30/17   [provider]  glucose 4 GM chewable tablet Chew 4 tablets by mouth once.    [provider]  HUMULIN R 100 UNIT/ML injection Inject 5 units subcutaneously BEFORE BREAKFAST AND DINNER 11/26/16   [provider]  HYDROcodone-acetaminophen (NORCO/VICODIN) 5-325 MG tablet Take 1 tablet by mouth every 6 (six) hours as needed. 07/08/17   Ward, Delice Bison, DO  insulin NPH Human (HUMULIN N,NOVOLIN N) 100 UNIT/ML injection Inject 0.38 mLs (38 Units total) into the skin 2 (two) times daily. 38 in am and 34 at night Patient taking differently: Inject 26-57 Units into the skin See admin instructions. Use 57 units  every morning and use 26 units every evening 06/03/14   Rai, Ripudeep K, MD  losartan (COZAAR) 50 MG tablet Take 1 tablet (50 mg total) by mouth daily. Patient taking differently: Take 50 mg by mouth 2 (two) times daily.  06/03/14   Rai, Vernelle Emerald, MD  metoprolol tartrate (LOPRESSOR) 25 MG tablet Take 25 mg by mouth 2 (two) times daily.  04/16/14   [provider]  Multiple Vitamins-Minerals (ONE-A-DAY 50 PLUS PO) Take 1 tablet by mouth daily.    [provider]  mupirocin ointment (BACTROBAN) 2 % Apply daily to right foot ulceration 03/15/17   Landis Martins, DPM  mupirocin ointment (BACTROBAN) 2 % Apply to affected area daily. Patient not taking: Reported on 07/06/2017 05/25/17   Landis Martins, DPM  NITROSTAT 0.4 MG SL tablet Place 1 tablet under the tongue every 5 (five) minutes x 3 doses as needed for chest pain. Chest pain  03/07/14   [provider]  ondansetron (ZOFRAN ODT) 4 MG disintegrating tablet Take 1 tablet (4 mg total) by mouth every 6 (six) hours as needed. 07/08/17   Ward, Delice Bison, DO    Family History Family History  Problem Relation Age of Onset  . Other Brother        intestinal blockage  . Diabetes Brother     Social History Social History   Tobacco Use  . Smoking status: Former Smoker    Packs/day: 2.00    Years: 30.00    Pack years: 60.00    Types: Cigarettes    Last attempt to quit: 04/19/1990    Years since quitting: 27.2  . Smokeless tobacco: Never Used  . Tobacco comment: stopped smoking cigarettes 1991  Substance Use Topics  . Alcohol use: No    Alcohol/week: 0.0 oz  . Drug use: No     Allergies   Morphine and related; Lisinopril; Sertraline hcl; and Zoloft [sertraline hcl]   Review of Systems Review of Systems  Cardiovascular: Positive for leg swelling.  Skin: Positive for color change.  All other systems reviewed and are negative.    Physical Exam Updated Vital Signs BP (!) 186/98 (BP Location: Right Arm)    Pulse 61   Temp 98 F (36.7 C) (Oral)   Resp 16   SpO2 97%   Physical Exam  Constitutional: She is oriented to person, place, and time. She appears well-developed and well-nourished. No distress.  Non toxic  HENT:  Head: Normocephalic and atraumatic.  Nose: Nose normal.  Mouth/Throat: No oropharyngeal exudate.  Moist mucous membranes   Eyes: Pupils are equal, round, and reactive to light. Conjunctivae and EOM are normal.  Neck: Normal range of motion.  Cardiovascular: Normal rate, regular rhythm and intact distal pulses.  No murmur heard. 1+ pitting edema to lower extremities, slightly worse on the left from ankle to mid tib-fib.  Faint but palpable DP and radial pulses bilaterally. Toes warm.   Pulmonary/Chest: Effort normal and breath sounds normal. No respiratory distress. She has no wheezes. She has no rales.  Abdominal: Soft. Bowel sounds are normal. There is no tenderness.  No G/R/R. No suprapubic or CVA tenderness.   Musculoskeletal: Normal range of motion. She exhibits no deformity.  Full passive range of motion of bilateral ankles, no pain with plantarflexion or dorsiflexion.  No focal bony tenderness to lower extremities.  Neurological: She is alert and oriented to person, place, and time.  Aphasia noted  Skin: Skin is warm and dry. Capillary refill takes less than 2 seconds. There is erythema.  Erythema to bilateral lower extremities from ankle to mid tib/fib with warmth.  Callus noticed to right plantar aspect of foot, nontender without drainage, fluctuance.  Large medial surgical incision noted well-healed to right lower extremity.  Psychiatric: She has a normal mood and affect. Her behavior is normal. Judgment and thought content normal.  Nursing note and vitals reviewed.      ED Treatments / Results  Labs (all labs ordered are listed, but only abnormal results are displayed) Labs Reviewed  COMPREHENSIVE METABOLIC PANEL - Abnormal; Notable for the following  components:      Result Value   Glucose, Bld 132 (*)    BUN 22 (*)    Creatinine, Ser 1.06 (*)    Albumin 2.6 (*)    Alkaline Phosphatase 128 (*)    GFR calc non Af Amer 49 (*)    GFR calc Af Amer 56 (*)  All other components within normal limits  CBC WITH DIFFERENTIAL/PLATELET - Abnormal; Notable for the following components:   RBC 3.80 (*)    All other components within normal limits  I-STAT CG4 LACTIC ACID, ED  I-STAT CG4 LACTIC ACID, ED    EKG EKG Interpretation  Date/Time:  Tuesday July 19 2017 15:04:08 EDT Ventricular Rate:  67 PR Interval:    QRS Duration: 112 QT Interval:  418 QTC Calculation: 441 R Axis:   136 Text Interpretation:  Atrial fibrillation Right axis deviation Abnormal QRS-T angle, consider primary T wave abnormality Abnormal ECG Confirmed by Quintella Reichert [phone]) on 07/29/2017 3:25:51 PM   Radiology No results found.  Procedures Procedures (including critical care time)  Medications Ordered in ED Medications - No data to display   Initial Impression / Assessment and Plan / ED Course  I have reviewed the triage vital signs and the nursing notes.  Pertinent labs & imaging results that were available during my care of the patient were reviewed by me and considered in my medical decision making (see chart for details).  Clinical Course as of Jul 29 1549  Fri Jul 29, 2017  1432 Monocytes Relative: 7 [CG]    Clinical Course User Index [CG] Kinnie Feil, [place]   82 year old here with bilateral lower extremity erythema, warmth, tenderness L > R.  She has focal tenderness to the left calf.  Extremities grossly neurovascularly intact, weak but palpable pulses.  Differential includes cellulitis versus DVT.  She has history of  PAD s/p atherectomy and stent, followed by vascular surgery.  On Eliquis.  No signs of systemic infection.  This does not fit CHF exacerbation, she has no chest pain, shortness of breath, cough.  Labs reviewed and  unremarkable.  Will obtain bilateral lower extremity vascular ultrasound. Considering outpatient abx for cellulitis vs consult vascular if new DVT despite Eliquis.   Final Clinical Impressions(s) / ED Diagnoses   1545: Bilateral ultrasound negative.  Right Baker's cyst noted.   Although high risk due to diabetes, PAD, feel patient is appropriate for outpatient antibiotic treatment.  No white blood cell count elevation, lactic acid normal, no fevers. Patient has scheduled follow-up appointment with PCP on Monday.  Discussed return precautions. Final diagnoses:  Bilateral lower leg cellulitis    ED Discharge Orders        Ordered    clindamycin (CLEOCIN) 150 MG capsule  3 times daily     07/29/17 1550       Arlean Hopping 07/29/17 1551    Lacretia Leigh, MD 07/29/17 1552

## 2017-07-29 NOTE — ED Notes (Signed)
Patient transported to Ultrasound 

## 2017-07-29 NOTE — Discharge Instructions (Addendum)
Ultrasound did not show blood clots in your legs.  Symptoms are most likely from cellulitis.  Take antibiotic as prescribed.  May take Tylenol for pain.  You are at high risk for complication, follow-up with your PCP in 48 hours for recheck.  Return for worsening redness, pain, swelling, fevers, chills.

## 2017-07-29 NOTE — ED Triage Notes (Signed)
Pt in c/o bilateral lower leg swelling, recently treated for cellulitis on her feet and completed her antibiotics , states this feels the same, redness noted to mid calf, worse on left leg

## 2017-07-29 NOTE — ED Notes (Signed)
edp aware of bp  

## 2017-08-25 ENCOUNTER — Encounter (HOSPITAL_COMMUNITY): Payer: Self-pay | Admitting: Emergency Medicine

## 2017-08-25 ENCOUNTER — Inpatient Hospital Stay (HOSPITAL_COMMUNITY)
Admission: EM | Admit: 2017-08-25 | Discharge: 2017-08-29 | DRG: 291 | Disposition: A | Discharge status: Home / Self Care | Payer: Medicare Other | Attending: Family Medicine | Admitting: Family Medicine

## 2017-08-25 ENCOUNTER — Emergency Department (HOSPITAL_COMMUNITY): Payer: Medicare Other

## 2017-08-25 ENCOUNTER — Other Ambulatory Visit: Payer: Self-pay

## 2017-08-25 DIAGNOSIS — E785 Hyperlipidemia, unspecified: Secondary | ICD-10-CM | POA: Diagnosis present

## 2017-08-25 DIAGNOSIS — E1122 Type 2 diabetes mellitus with diabetic chronic kidney disease: Secondary | ICD-10-CM | POA: Diagnosis present

## 2017-08-25 DIAGNOSIS — D509 Iron deficiency anemia, unspecified: Secondary | ICD-10-CM

## 2017-08-25 DIAGNOSIS — I5032 Chronic diastolic (congestive) heart failure: Secondary | ICD-10-CM | POA: Diagnosis not present

## 2017-08-25 DIAGNOSIS — I482 Chronic atrial fibrillation, unspecified: Secondary | ICD-10-CM | POA: Diagnosis present

## 2017-08-25 DIAGNOSIS — M797 Fibromyalgia: Secondary | ICD-10-CM | POA: Diagnosis present

## 2017-08-25 DIAGNOSIS — I350 Nonrheumatic aortic (valve) stenosis: Secondary | ICD-10-CM | POA: Diagnosis present

## 2017-08-25 DIAGNOSIS — E1151 Type 2 diabetes mellitus with diabetic peripheral angiopathy without gangrene: Secondary | ICD-10-CM | POA: Diagnosis present

## 2017-08-25 DIAGNOSIS — I739 Peripheral vascular disease, unspecified: Secondary | ICD-10-CM | POA: Diagnosis present

## 2017-08-25 DIAGNOSIS — I2510 Atherosclerotic heart disease of native coronary artery without angina pectoris: Secondary | ICD-10-CM | POA: Diagnosis present

## 2017-08-25 DIAGNOSIS — Z961 Presence of intraocular lens: Secondary | ICD-10-CM | POA: Diagnosis present

## 2017-08-25 DIAGNOSIS — M1990 Unspecified osteoarthritis, unspecified site: Secondary | ICD-10-CM | POA: Diagnosis present

## 2017-08-25 DIAGNOSIS — I5033 Acute on chronic diastolic (congestive) heart failure: Secondary | ICD-10-CM | POA: Diagnosis present

## 2017-08-25 DIAGNOSIS — H548 Legal blindness, as defined in USA: Secondary | ICD-10-CM | POA: Diagnosis present

## 2017-08-25 DIAGNOSIS — R0602 Shortness of breath: Secondary | ICD-10-CM | POA: Diagnosis present

## 2017-08-25 DIAGNOSIS — E1129 Type 2 diabetes mellitus with other diabetic kidney complication: Secondary | ICD-10-CM | POA: Diagnosis present

## 2017-08-25 DIAGNOSIS — Z951 Presence of aortocoronary bypass graft: Secondary | ICD-10-CM

## 2017-08-25 DIAGNOSIS — I4891 Unspecified atrial fibrillation: Secondary | ICD-10-CM | POA: Diagnosis not present

## 2017-08-25 DIAGNOSIS — J9601 Acute respiratory failure with hypoxia: Secondary | ICD-10-CM

## 2017-08-25 DIAGNOSIS — Z66 Do not resuscitate: Secondary | ICD-10-CM | POA: Diagnosis present

## 2017-08-25 DIAGNOSIS — N183 Chronic kidney disease, stage 3 (moderate): Secondary | ICD-10-CM | POA: Diagnosis present

## 2017-08-25 DIAGNOSIS — L03116 Cellulitis of left lower limb: Secondary | ICD-10-CM | POA: Diagnosis present

## 2017-08-25 DIAGNOSIS — Z79899 Other long term (current) drug therapy: Secondary | ICD-10-CM

## 2017-08-25 DIAGNOSIS — I493 Ventricular premature depolarization: Secondary | ICD-10-CM | POA: Diagnosis present

## 2017-08-25 DIAGNOSIS — Z833 Family history of diabetes mellitus: Secondary | ICD-10-CM

## 2017-08-25 DIAGNOSIS — E669 Obesity, unspecified: Secondary | ICD-10-CM | POA: Diagnosis present

## 2017-08-25 DIAGNOSIS — I878 Other specified disorders of veins: Secondary | ICD-10-CM | POA: Diagnosis present

## 2017-08-25 DIAGNOSIS — Z794 Long term (current) use of insulin: Secondary | ICD-10-CM | POA: Diagnosis not present

## 2017-08-25 DIAGNOSIS — I509 Heart failure, unspecified: Secondary | ICD-10-CM

## 2017-08-25 DIAGNOSIS — I480 Paroxysmal atrial fibrillation: Secondary | ICD-10-CM | POA: Diagnosis present

## 2017-08-25 DIAGNOSIS — R0902 Hypoxemia: Secondary | ICD-10-CM

## 2017-08-25 DIAGNOSIS — I130 Hypertensive heart and chronic kidney disease with heart failure and stage 1 through stage 4 chronic kidney disease, or unspecified chronic kidney disease: Secondary | ICD-10-CM | POA: Diagnosis present

## 2017-08-25 DIAGNOSIS — I69320 Aphasia following cerebral infarction: Secondary | ICD-10-CM | POA: Diagnosis not present

## 2017-08-25 DIAGNOSIS — Z6833 Body mass index (BMI) 33.0-33.9, adult: Secondary | ICD-10-CM | POA: Diagnosis not present

## 2017-08-25 DIAGNOSIS — K219 Gastro-esophageal reflux disease without esophagitis: Secondary | ICD-10-CM | POA: Diagnosis present

## 2017-08-25 DIAGNOSIS — D631 Anemia in chronic kidney disease: Secondary | ICD-10-CM | POA: Diagnosis present

## 2017-08-25 DIAGNOSIS — Z7901 Long term (current) use of anticoagulants: Secondary | ICD-10-CM

## 2017-08-25 DIAGNOSIS — Z885 Allergy status to narcotic agent status: Secondary | ICD-10-CM

## 2017-08-25 DIAGNOSIS — Z9841 Cataract extraction status, right eye: Secondary | ICD-10-CM

## 2017-08-25 DIAGNOSIS — Z87891 Personal history of nicotine dependence: Secondary | ICD-10-CM

## 2017-08-25 DIAGNOSIS — Z888 Allergy status to other drugs, medicaments and biological substances status: Secondary | ICD-10-CM

## 2017-08-25 DIAGNOSIS — L03115 Cellulitis of right lower limb: Secondary | ICD-10-CM | POA: Diagnosis present

## 2017-08-25 DIAGNOSIS — I639 Cerebral infarction, unspecified: Secondary | ICD-10-CM | POA: Diagnosis present

## 2017-08-25 DIAGNOSIS — Z9842 Cataract extraction status, left eye: Secondary | ICD-10-CM

## 2017-08-25 DIAGNOSIS — I252 Old myocardial infarction: Secondary | ICD-10-CM

## 2017-08-25 DIAGNOSIS — Z87440 Personal history of urinary (tract) infections: Secondary | ICD-10-CM

## 2017-08-25 LAB — COMPREHENSIVE METABOLIC PANEL
ALK PHOS: 135 U/L — AB (ref 38–126)
ALT: 27 U/L (ref 14–54)
AST: 43 U/L — ABNORMAL HIGH (ref 15–41)
Albumin: 2.5 g/dL — ABNORMAL LOW (ref 3.5–5.0)
Anion gap: 8 (ref 5–15)
BILIRUBIN TOTAL: 1.3 mg/dL — AB (ref 0.3–1.2)
BUN: 16 mg/dL (ref 6–20)
CALCIUM: 8.8 mg/dL — AB (ref 8.9–10.3)
CO2: 26 mmol/L (ref 22–32)
CREATININE: 1.07 mg/dL — AB (ref 0.44–1.00)
Chloride: 103 mmol/L (ref 101–111)
GFR, EST AFRICAN AMERICAN: 56 mL/min — AB (ref >60)
GFR, EST NON AFRICAN AMERICAN: 48 mL/min — AB (ref >60)
Glucose, Bld: 237 mg/dL — ABNORMAL HIGH (ref 65–99)
Potassium: 3.7 mmol/L (ref 3.5–5.1)
SODIUM: 137 mmol/L (ref 135–145)
TOTAL PROTEIN: 7.2 g/dL (ref 6.5–8.1)

## 2017-08-25 LAB — CBC WITH DIFFERENTIAL/PLATELET
Basophils Absolute: 0.1 10*3/uL (ref 0.0–0.1)
Basophils Relative: 1 %
Eosinophils Absolute: 0.3 10*3/uL (ref 0.0–0.7)
Eosinophils Relative: 3 %
HEMATOCRIT: 32.2 % — AB (ref 36.0–46.0)
HEMOGLOBIN: 11 g/dL — AB (ref 12.0–15.0)
LYMPHS ABS: 1.2 10*3/uL (ref 0.7–4.0)
LYMPHS PCT: 13 %
MCH: 32.3 pg (ref 26.0–34.0)
MCHC: 34.2 g/dL (ref 30.0–36.0)
MCV: 94.4 fL (ref 78.0–100.0)
Monocytes Absolute: 0.9 10*3/uL (ref 0.1–1.0)
Monocytes Relative: 10 %
NEUTROS ABS: 7 10*3/uL (ref 1.7–7.7)
NEUTROS PCT: 73 %
Platelets: 325 10*3/uL (ref 150–400)
RBC: 3.41 MIL/uL — AB (ref 3.87–5.11)
RDW: 14.5 % (ref 11.5–15.5)
WBC: 9.5 10*3/uL (ref 4.0–10.5)

## 2017-08-25 LAB — I-STAT TROPONIN, ED: TROPONIN I, POC: 0.04 ng/mL (ref 0.00–0.08)

## 2017-08-25 LAB — GLUCOSE, CAPILLARY
Glucose-Capillary: 214 mg/dL — ABNORMAL HIGH (ref 65–99)
Glucose-Capillary: 188 mg/dL — ABNORMAL HIGH (ref 65–99)

## 2017-08-25 LAB — TROPONIN I
Troponin I: 0.05 ng/mL (ref <0.03)
Troponin I: 0.05 ng/mL (ref <0.03)

## 2017-08-25 LAB — I-STAT CG4 LACTIC ACID, ED: LACTIC ACID, VENOUS: 1.12 mmol/L (ref 0.5–1.9)

## 2017-08-25 LAB — C-REACTIVE PROTEIN: CRP: 4.2 mg/dL — AB (ref <1.0)

## 2017-08-25 LAB — SEDIMENTATION RATE: Sed Rate: 97 mm/hr — ABNORMAL HIGH (ref 0–22)

## 2017-08-25 LAB — D-DIMER, QUANTITATIVE: D-Dimer, Quant: 1.18 ug/mL-FEU — ABNORMAL HIGH (ref 0.00–0.50)

## 2017-08-25 LAB — BRAIN NATRIURETIC PEPTIDE: B NATRIURETIC PEPTIDE 5: 432.3 pg/mL — AB (ref 0.0–100.0)

## 2017-08-25 MED ORDER — MUPIROCIN 2 % EX OINT
TOPICAL_OINTMENT | Freq: Every day | CUTANEOUS | Status: DC
  Administered 2017-08-27 – 2017-08-29 (×2): via TOPICAL
  Filled 2017-08-25: qty 22

## 2017-08-25 MED ORDER — SODIUM CHLORIDE 0.9% FLUSH: 3.0000 mL | INTRAVENOUS | Status: DC | PRN

## 2017-08-25 MED ORDER — SODIUM CHLORIDE 0.9 % IV BOLUS: 500.0000 mL | Freq: Once | INTRAVENOUS | Status: DC

## 2017-08-25 MED ORDER — LOSARTAN POTASSIUM 50 MG PO TABS
50.0000 mg | ORAL_TABLET | Freq: Every day | ORAL | Status: DC
  Administered 2017-08-26 – 2017-08-29 (×4): 50 mg via ORAL
  Filled 2017-08-25 (×4): qty 1

## 2017-08-25 MED ORDER — ACETAMINOPHEN 325 MG PO TABS
650.0000 mg | ORAL_TABLET | ORAL | Status: DC | PRN
  Administered 2017-08-26 – 2017-08-27 (×2): 650 mg via ORAL
  Filled 2017-08-25 (×2): qty 2

## 2017-08-25 MED ORDER — ATORVASTATIN CALCIUM 80 MG PO TABS
80.0000 mg | ORAL_TABLET | Freq: Every day | ORAL | Status: DC
  Administered 2017-08-25 – 2017-08-29 (×5): 80 mg via ORAL
  Filled 2017-08-25 (×5): qty 1

## 2017-08-25 MED ORDER — AMLODIPINE BESYLATE 10 MG PO TABS
10.0000 mg | ORAL_TABLET | Freq: Every day | ORAL | Status: DC
  Administered 2017-08-25 – 2017-08-29 (×5): 10 mg via ORAL
  Filled 2017-08-25 (×5): qty 1

## 2017-08-25 MED ORDER — FUROSEMIDE 10 MG/ML IJ SOLN
40.0000 mg | Freq: Two times a day (BID) | INTRAMUSCULAR | Status: DC
  Administered 2017-08-26: 40 mg via INTRAVENOUS
  Filled 2017-08-25: qty 4

## 2017-08-25 MED ORDER — VANCOMYCIN HCL IN DEXTROSE 1-5 GM/200ML-% IV SOLN
1000.0000 mg | Freq: Two times a day (BID) | INTRAVENOUS | Status: DC
  Administered 2017-08-25: 1000 mg via INTRAVENOUS
  Filled 2017-08-25 (×2): qty 200

## 2017-08-25 MED ORDER — SODIUM CHLORIDE 0.9 % IV SOLN: 250.0000 mL | INTRAVENOUS | Status: DC | PRN

## 2017-08-25 MED ORDER — INSULIN NPH (HUMAN) (ISOPHANE) 100 UNIT/ML ~~LOC~~ SUSP
22.0000 [IU] | Freq: Two times a day (BID) | SUBCUTANEOUS | Status: DC
  Administered 2017-08-26 – 2017-08-29 (×7): 22 [IU] via SUBCUTANEOUS
  Filled 2017-08-25: qty 10

## 2017-08-25 MED ORDER — PIPERACILLIN-TAZOBACTAM 3.375 G IVPB 30 MIN
3.3750 g | Freq: Once | INTRAVENOUS | Status: AC
  Administered 2017-08-25: 3.375 g via INTRAVENOUS
  Filled 2017-08-25: qty 50

## 2017-08-25 MED ORDER — SODIUM CHLORIDE 0.9% FLUSH
3.0000 mL | Freq: Two times a day (BID) | INTRAVENOUS | Status: DC
  Administered 2017-08-25 – 2017-08-29 (×7): 3 mL via INTRAVENOUS

## 2017-08-25 MED ORDER — IOPAMIDOL (ISOVUE-370) INJECTION 76%
INTRAVENOUS | Status: AC
  Filled 2017-08-25: qty 100

## 2017-08-25 MED ORDER — APIXABAN 5 MG PO TABS
5.0000 mg | ORAL_TABLET | Freq: Two times a day (BID) | ORAL | Status: DC
Start: 2017-08-25 — End: 2017-08-29
  Administered 2017-08-25 – 2017-08-29 (×9): 5 mg via ORAL
  Filled 2017-08-25 (×9): qty 1

## 2017-08-25 MED ORDER — INSULIN ASPART 100 UNIT/ML ~~LOC~~ SOLN
0.0000 [IU] | Freq: Three times a day (TID) | SUBCUTANEOUS | Status: DC
  Administered 2017-08-25: 3 [IU] via SUBCUTANEOUS
  Administered 2017-08-26: 5 [IU] via SUBCUTANEOUS
  Administered 2017-08-26: 3 [IU] via SUBCUTANEOUS
  Administered 2017-08-26 – 2017-08-27 (×2): 2 [IU] via SUBCUTANEOUS
  Administered 2017-08-27: 3 [IU] via SUBCUTANEOUS
  Administered 2017-08-27: 1 [IU] via SUBCUTANEOUS
  Administered 2017-08-28: 2 [IU] via SUBCUTANEOUS

## 2017-08-25 MED ORDER — FUROSEMIDE 10 MG/ML IJ SOLN
80.0000 mg | Freq: Once | INTRAMUSCULAR | Status: AC
  Administered 2017-08-25: 80 mg via INTRAVENOUS
  Filled 2017-08-25: qty 8

## 2017-08-25 MED ORDER — ONDANSETRON HCL 4 MG/2ML IJ SOLN: 4.0000 mg | Freq: Four times a day (QID) | INTRAMUSCULAR | Status: DC | PRN

## 2017-08-25 MED ORDER — SODIUM CHLORIDE 0.9 % IV SOLN
1.0000 g | INTRAVENOUS | Status: DC
  Administered 2017-08-25: 1 g via INTRAVENOUS
  Filled 2017-08-25 (×2): qty 10

## 2017-08-25 MED ORDER — PIPERACILLIN-TAZOBACTAM 3.375 G IVPB
3.3750 g | Freq: Three times a day (TID) | INTRAVENOUS | Status: DC
  Filled 2017-08-25: qty 50

## 2017-08-25 MED ORDER — IOPAMIDOL (ISOVUE-370) INJECTION 76%
100.0000 mL | Freq: Once | INTRAVENOUS | Status: AC | PRN
  Administered 2017-08-25: 100 mL via INTRAVENOUS

## 2017-08-25 NOTE — Progress Notes (Signed)
Pharmacy Antibiotic Note  Kristina Summers is a 82 y.o. female admitted on 08/25/2017 with lower extremity cellulitis. Patient has been on "multiple antibiotics" for this cellulitis (appears most recently clindamycin) and was recently hospitalized for facial cellulitis. Pharmacy has been consulted for vancomycin and Zosyn dosing. WBC WNL, currently AF. Scr 1.07 (BL ~1), estimated CrCl ~65 mL/min.  Plan: Vancomycin 1000mg  IV q12h. Goal trough 10-15 mcg/mL. Zosyn 3.375g IV q8h (4 hour infusion). Would recommend de-escalation if only infectious concern is cellulitis. F/u C&S, clinical status, renal function, de-escalation, LOT, vancomycin levels as indicated.  Height: 5\' 6"  (167.6 cm) Weight: 212 lb (96.2 kg) IBW/kg (Calculated) : 59.3  Temp (24hrs), Avg:98.2 F (36.8 C), Min:98.2 F (36.8 C), Max:98.2 F (36.8 C)  Recent Labs  Lab 08/25/17 0410 08/25/17 0420  WBC 9.5  --   CREATININE 1.07*  --   LATICACIDVEN  --  1.12    Estimated Creatinine Clearance: 49.9 mL/min (A) (by C-G formula based on SCr of 1.07 mg/dL (H)).    Allergies  Allergen Reactions  . Morphine And Related Nausea And Vomiting    Chest pain    . Lisinopril Cough  . Sertraline Hcl Nausea Only  . Zoloft [Sertraline Hcl] Nausea Only        Antimicrobials this admission: Vanc 5/9 >> Zosyn 5/9 >>  Dose adjustments this admission: None  Microbiology results: 5/9 BCx: pending  Thank you for allowing pharmacy to be a part of this patient's care.  Mila Merry Gerarda Fraction, PharmD PGY1 Pharmacy Resident Pager: [phone] 08/25/2017 8:06 AM
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
## 2017-08-25 NOTE — ED Notes (Signed)
Care assumed at this time; resting quietly on stretcher with eyes closed - easily arousable; denies any c/o pain or other discomfort at this time; presently on 3L O2/[place]; o2 sats 98%; in no acute resp distress

## 2017-08-25 NOTE — ED Provider Notes (Signed)
[facility] EMERGENCY DEPARTMENT Provider Note   CSN: [id number] Arrival date & time: 08/25/17  0306     History   Chief Complaint Chief Complaint  Patient presents with  . Shortness of Breath    HPI Kristina Summers is a 82 y.o. female.  Patient presents to the emergency department for evaluation of shortness of breath.  Patient reports that her symptoms began yesterday afternoon around 1 PM.  Over the afternoon and evening her shortness of breath progressively worsened.  She has not had any associated chest pain.  EMS report that when they arrived on the scene she appeared dyspneic but also was very anxious.  They talk to her and calmed her down and her breathing improved.  She has not been hypoxic.  Patient also complains of left knee pain.  She reports that this has been ongoing for about 2 weeks.  She denies any direct injury.  She has recently been on multiple antibiotics for cellulitis of the legs.     Past Medical History:  Diagnosis Date  . Angina   . Arthritis   . Atrial fibrillation ([place])   . CAD (coronary artery disease)   . Carotid artery occlusion   . Diabetes mellitus   . Diastolic CHF ([place]) 32/2025  . Fibromyalgia   . GERD (gastroesophageal reflux disease)   . GI bleed 1/11  . Hyperlipidemia   . Hypertension   . Myocardial infarction ([place]) ~ 1991  . Normal nuclear stress test 03/18/2011  . Obesity   . Peripheral vascular disease ([place])   . Stroke [facility])    May 24, 2014  . UTI (lower urinary tract infection) Sept. 2015    Patient Active Problem List   Diagnosis Date Noted  . Sepsis ([place]) 06/10/2017  . Facial cellulitis 06/10/2017  . Acute renal failure superimposed on stage 2 chronic kidney disease ([place]) 06/10/2017  . Chronic diastolic CHF (congestive heart failure) ([place]) 06/10/2017  . Essential hypertension 06/10/2017  . Type II diabetes mellitus with renal manifestations ([place]) 06/10/2017  . Acute exacerbation of Diastolic CHF  (congestive heart failure) /EF 60 % 01/28/2017  . Cerebral infarction ([place]) 10-18-202016  . Cerebral embolism with cerebral infarction ([place]) 10-18-202016  . Fall   . Stroke ([place])   . UTI (lower urinary tract infection) 05/29/2014  . Fracture of distal end of right fibula 05/29/2014  . Rhabdomyolysis 05/29/2014  . Transaminitis 05/29/2014  . Weakness of both lower extremities 01/11/2014  . Pain of left lower extremity- and Right 01/11/2014  . Aftercare following surgery of the circulatory system, [place] 01/11/2014  . Peripheral vascular disease, unspecified ([place]) 01/11/2014  . Cellulitis of great toe, right 08/23/2013  . CAD (coronary artery disease)   . Hypertensive heart disease without CHF   . Paroxysmal atrial fibrillation   . Diabetes mellitus type 2 with peripheral artery disease ([place])   . Hyperlipidemia   . Atherosclerosis of native arteries of extremity with intermittent claudication ([place])   . History of GI bleed 04/21/2009  . S/P CABG (coronary artery bypass graft) 11/20/1998    Past Surgical History:  Procedure Laterality Date  . ABDOMINAL AORTAGRAM N/A 04/01/2011   Procedure: ABDOMINAL AORTAGRAM;  Surgeon: Conrad Chouteau, MD;  Location: [facility] CATH LAB;  Service: Cardiovascular;  Laterality: N/A;  . ANGIOPLASTY  06/17/11   Left leg common femoral artery cannulation under u/s Left leg runoff  . CATARACT EXTRACTION W/ INTRAOCULAR LENS  IMPLANT, BILATERAL  2004-2005  . CORONARY ARTERY BYPASS GRAFT  2000   CABG X5  . EYE SURGERY    . LOWER EXTREMITY ANGIOGRAM Bilateral 04/01/2011   Procedure: LOWER EXTREMITY ANGIOGRAM;  Surgeon: Conrad [place], MD;  Location[facility] CATH LAB;  Service: Cardiovascular;  Laterality: Bilateral;  . LOWER EXTREMITY ANGIOGRAM Left 06/17/2011   Procedure: LOWER EXTREMITY ANGIOGRAM;  Surgeon: Conrad [place], MD;  Location: [facility] CATH LAB;  Service: Cardiovascular;  Laterality: Left;  . LOWER EXTREMITY ANGIOGRAM N/A 11/18/2011   Procedure: LOWER EXTREMITY ANGIOGRAM;   Surgeon: Conrad [place], MD;  Location: [facility] CATH LAB;  Service: Cardiovascular;  Laterality: N/A;  . PERCUTANEOUS STENT INTERVENTION Right 04/01/2011   Procedure: PERCUTANEOUS STENT INTERVENTION;  Surgeon: Conrad La[place]side, MD;  Location: [facility] CATH LAB;  Service: Cardiovascular;  Laterality: Right;  rt iliac stent  . PERIPHERAL ARTERIAL STENT GRAFT  04/01/11   right common iliac  . TRIGGER FINGER RELEASE  1996   right thumb     OB History   None      Home Medications    Prior to Admission medications   Medication Sig Start Date End Date Taking? Authorizing Provider  acetaminophen (TYLENOL) 325 MG tablet Take 650 mg by mouth every 6 (six) hours as needed for headache.    [provider]  amLODipine (NORVASC) 5 MG tablet Take 1 tablet by mouth daily. 03/06/12   [provider]  apixaban (ELIQUIS) 5 MG TABS tablet Take 1 tablet (5 mg total) by mouth 2 (two) times daily. 06/03/14   Rai, Vernelle Emerald, MD  atorvastatin (LIPITOR) 80 MG tablet Take 1 tablet (80 mg total) by mouth daily. Patient taking differently: Take 80 mg by mouth at bedtime.  11/27/12   Luana Shu, Freeman Caldron, PA-C  cephALEXin (KEFLEX) 500 MG capsule Take 1 capsule (500 mg total) by mouth 2 (two) times daily. 07/08/17   Doristine Devoid, PA-C  furosemide (LASIX) 40 MG tablet Take 40 mg by mouth 2 (two) times daily. 06/30/17   [provider]  glucose 4 GM chewable tablet Chew 4 tablets by mouth once.    [provider]  HUMULIN R 100 UNIT/ML injection Inject 5 units subcutaneously BEFORE BREAKFAST AND DINNER 11/26/16   [provider]  HYDROcodone-acetaminophen (NORCO/VICODIN) 5-325 MG tablet Take 1 tablet by mouth every 6 (six) hours as needed. 07/08/17   Ward, Delice Bison, DO  insulin NPH Human (HUMULIN N,NOVOLIN N) 100 UNIT/ML injection Inject 0.38 mLs (38 Units total) into the skin 2 (two) times daily. 38 in am and 34 at night Patient taking differently: Inject 26-57 Units into the skin See admin  instructions. Use 57 units every morning and use 26 units every evening 06/03/14   Rai, Ripudeep K, MD  losartan (COZAAR) 50 MG tablet Take 1 tablet (50 mg total) by mouth daily. Patient taking differently: Take 50 mg by mouth 2 (two) times daily.  06/03/14   Rai, Vernelle Emerald, MD  metoprolol tartrate (LOPRESSOR) 25 MG tablet Take 25 mg by mouth 2 (two) times daily.  04/16/14   [provider]  Multiple Vitamins-Minerals (ONE-A-DAY 50 PLUS PO) Take 1 tablet by mouth daily.    [provider]  mupirocin ointment (BACTROBAN) 2 % Apply daily to right foot ulceration 03/15/17   Landis Martins, DPM  mupirocin ointment (BACTROBAN) 2 % Apply to affected area daily. Patient not taking: Reported on 07/06/2017 05/25/17   Landis Martins, DPM  NITROSTAT 0.4 MG SL tablet Place 1 tablet under the tongue every 5 (five) minutes x  3 doses as needed for chest pain. Chest pain 03/07/14   [provider]  ondansetron (ZOFRAN ODT) 4 MG disintegrating tablet Take 1 tablet (4 mg total) by mouth every 6 (six) hours as needed. 07/08/17   Ward, Delice Bison, DO    Family History Family History  Problem Relation Age of Onset  . Other Brother        intestinal blockage  . Diabetes Brother     Social History Social History   Tobacco Use  . Smoking status: Former Smoker    Packs/day: 2.00    Years: 30.00    Pack years: 60.00    Types: Cigarettes    Last attempt to quit: 04/19/1990    Years since quitting: 27.3  . Smokeless tobacco: Never Used  . Tobacco comment: stopped smoking cigarettes 1991  Substance Use Topics  . Alcohol use: No    Alcohol/week: 0.0 oz  . Drug use: No     Allergies   Morphine and related; Lisinopril; Sertraline hcl; and Zoloft [sertraline hcl]   Review of Systems Review of Systems  Respiratory: Positive for shortness of breath.   Cardiovascular: Negative for chest pain.  Musculoskeletal: Positive for arthralgias.  Skin: Positive for color change.    Psychiatric/Behavioral: The patient is nervous/anxious.   All other systems reviewed and are negative.    Physical Exam Updated Vital Signs BP 125/84   Pulse 92   Temp 98.2 F (36.8 C) (Oral)   Resp 14   Ht 5\' 6"  (1.676 m)   Wt 96.2 kg (212 lb)   SpO2 97%   BMI 34.22 kg/m   Physical Exam  Constitutional: She is oriented to person, place, and time. She appears well-developed and well-nourished. No distress.  HENT:  Head: Normocephalic and atraumatic.  Right Ear: Hearing normal.  Left Ear: Hearing normal.  Nose: Nose normal.  Mouth/Throat: Oropharynx is clear and moist and mucous membranes are normal.  Eyes: Pupils are equal, round, and reactive to light. Conjunctivae and EOM are normal.  Neck: Normal range of motion. Neck supple.  Cardiovascular: S1 normal and S2 normal. An irregularly irregular rhythm present. Tachycardia present. Exam reveals no gallop and no friction rub.  No murmur heard. Pulmonary/Chest: Effort normal and breath sounds normal. No respiratory distress. She exhibits no tenderness.  Abdominal: Soft. Normal appearance and bowel sounds are normal. There is no hepatosplenomegaly. There is no tenderness. There is no rebound, no guarding, no tenderness at McBurney's point and negative Murphy's sign. No hernia.  Musculoskeletal: Normal range of motion.       Left knee: She exhibits swelling and erythema. She exhibits normal range of motion.  Neurological: She is alert and oriented to person, place, and time. She has normal strength. No cranial nerve deficit or sensory deficit. Coordination normal. GCS eye subscore is 4. GCS verbal subscore is 5. GCS motor subscore is 6.  Skin: Skin is warm, dry and intact. No rash noted. No cyanosis.  Mild erythema of bilateral lower extremities, question stasis dermatitis  Erythema and warmth medial aspect of left knee and area just below knee  Psychiatric: She has a normal mood and affect. Her speech is normal and behavior is  normal. Thought content normal.  Nursing note and vitals reviewed.    ED Treatments / Results  Labs (all labs ordered are listed, but only abnormal results are displayed) Labs Reviewed  CBC WITH DIFFERENTIAL/PLATELET - Abnormal; Notable for the following components:      Result Value  RBC 3.41 (*)    Hemoglobin 11.0 (*)    HCT 32.2 (*)    All other components within normal limits  COMPREHENSIVE METABOLIC PANEL - Abnormal; Notable for the following components:   Glucose, Bld 237 (*)    Creatinine, Ser 1.07 (*)    Calcium 8.8 (*)    Albumin 2.5 (*)    AST 43 (*)    Alkaline Phosphatase 135 (*)    Total Bilirubin 1.3 (*)    GFR calc non Af Amer 48 (*)    GFR calc Af Amer 56 (*)    All other components within normal limits  BRAIN NATRIURETIC PEPTIDE - Abnormal; Notable for the following components:   B Natriuretic Peptide 432.3 (*)    All other components within normal limits  SEDIMENTATION RATE - Abnormal; Notable for the following components:   Sed Rate 97 (*)    All other components within normal limits  C-REACTIVE PROTEIN - Abnormal; Notable for the following components:   CRP 4.2 (*)    All other components within normal limits  D-DIMER, QUANTITATIVE (NOT AT [facility]) - Abnormal; Notable for the following components:   D-Dimer, Quant 1.18 (*)    All other components within normal limits  I-STAT CG4 LACTIC ACID, ED  I-STAT TROPONIN, ED    EKG EKG Interpretation  Date/Time:  Thursday Aug 25 2017 03:16:50 EDT Ventricular Rate:  105 PR Interval:    QRS Duration: 124 QT Interval:  378 QTC Calculation: 500 R Axis:   47 Text Interpretation:  Atrial fibrillation Multiple ventricular premature complexes Probable left ventricular hypertrophy Anterior Q waves, possibly due to LVH No significant change since last tracing Confirmed by Orpah Greek [phone]) on 08/25/2017 3:21:03 AM Also confirmed by Orpah Greek [id number]), editor Hattie Perch [phone])  on  08/25/2017 6:50:29 AM   Radiology Dg Chest 2 View  Result Date: 08/25/2017 CLINICAL DATA:  82 year old female with shortness of breath. EXAM: CHEST - 2 VIEW COMPARISON:  Chest radiograph dated 07/08/2017 FINDINGS: There is mild cardiomegaly. There is diffuse interstitial prominence consistent with interstitial edema. Pneumonia is not excluded. Clinical correlation is recommended. Small bilateral pleural effusions may be present. There is no focal consolidation or pneumothorax. Median sternotomy wires noted. No acute osseous pathology. IMPRESSION: Cardiomegaly with interstitial edema and probable small pleural effusions. No focal consolidation. Electronically Signed   By: Anner Crete M.D.   On: 08/25/2017 04:39   Dg Knee Complete 4 Views Left  Result Date: 08/25/2017 CLINICAL DATA:  [age]-year-old female with left knee pain and swelling. EXAM: LEFT KNEE - COMPLETE 4+ VIEW COMPARISON:  None. FINDINGS: There is no acute fracture or dislocation. Mild osteopenia. There is mild arthritic changes with narrowing of the medial compartment and meniscal chondrocalcinosis of the lateral compartment. No joint effusion. There is diffuse subcutaneous edema. IMPRESSION: 1. No acute fracture or dislocation. 2. Mild arthritic changes. 3. Diffuse subcutaneous edema may represent cellulitis. Clinical correlation is recommended. Electronically Signed   By: Anner Crete M.D.   On: 08/25/2017 04:40    Procedures Procedures (including critical care time)  Medications Ordered in ED Medications - No data to display   Initial Impression / Assessment and Plan / ED Course  I have reviewed the triage vital signs and the nursing notes.  Pertinent labs & imaging results that were available during my care of the patient were reviewed by me and considered in my medical decision making (see chart for details).     Patient  presented to the emergency department for evaluation of difficulty breathing.  Patient reports  that she has been undergoing treatment for lower leg cellulitis, has been on multiple antibiotics.  Reviewing her records reveals that she has also recently been hospitalized for facial cellulitis.  Examination of her lower extremities today reveals that she does not appear to have cellulitis in the lower leg distribution as previously seen.  She might have slight chronic stasis dermatitis type findings but not cellulitis.  She does, however, have erythema, warmth and swelling of the soft tissues around the left knee.  She does not have an obvious joint effusion.  She does not have decreased range of motion.  Joint infection less likely, most likely an overlying cellulitis.  Sed rate and C-reactive protein are elevated.  They are, however, in the same range as when she had the facial cellulitis 2 months ago.  Patient complaining of shortness of breath primarily.  She does have a history of diastolic heart failure.  Chest x-ray and BNP do not suggest overt heart failure.  Although she is on Eliquis, PE is considered because she has been very sedentary with her recent illnesses.  CT angiography will be performed. Will sign out to oncoming ER physician to follow up CT.  Final Clinical Impressions(s) / ED Diagnoses   Final diagnoses:  Cellulitis of knee, left  SOB (shortness of breath)    ED Discharge Orders    None       Pollina, Gwenyth Allegra, MD 08/25/17 [phone]

## 2017-08-25 NOTE — ED Notes (Signed)
Report called to [facility] [place] - pt transported via stretcher per EDT; resp even, nonlabored,

## 2017-08-25 NOTE — ED Provider Notes (Signed)
Patient turned over to me from the overnight physician Dr. Mariane Baumgarten.  Patient was awaiting CT angios which was negative for pulmonary embolus but is consistent with moderate CHF.  Patient also on room air has hypoxia.  Patient treated with 80 mg of Lasix normally takes Lasix daily.  Patient also with cellulitis of both lower extremities left greater than right.  And was treated with antibiotics by Dr. Mariane Baumgarten for this.  Patient will require admission.  Patient's primary care doctor is [name].  Will contact hospitalist for admission.  On room air patient's oxygen saturations go down to 88%.  Patient's had a previous stroke.  Still has some speech problems.   Fredia Sorrow, MD 08/25/17 1029

## 2017-08-25 NOTE — H&P (Signed)
History and Physical    Kristina Summers ZCH:[id number] DOB: [birth year] DOA: 08/25/2017  PCP: Rogers Blocker, MD Consultants:  none Patient coming from: Home - lives alone   Chief Complaint: leg swelling and shortness of breath  HPI: Kristina Summers is a 82 y.o. female with medical history significant of HTN, HLD, DM, CVA with residual expressive aphasia, GERD, PVD followed by Dr Bridgett Larsson, CAD s/p CABG, dCHF, afib on Eliquis, stage II CKD, legally blind presents with sob. Pt seen in ED in 07/2017 with LE swelling and erythema and discharged on Clindamycin. Venous dopplers at that time negative for DVT and showed right Baker's cyst. Pt presents today with main complaint of SOB but always with increased redness to RLE. Pt reports onset of sob yesterday afternoon while at rest. She was unable to lie flat. States she took 3 SL nitro with no change in symptoms prompting her to call EMS. Pt denies recent fever, chills, headache, dizziness, chest pain, abdominal pain, n/v/d, melena or hematochezia. Pt states she finished her oral clindamycin for cellulitis, but has noticed increased erythema bilaterally to lower extremities x 1 week.    ED Course: 88% on RA, CT angio negative PE, c/w pulmonary edema. Pt received Lasix 31m IV. Suspected LE cellulitis treated with Vanco/Zosyn, lactic acid 1.12, WBC wnl, ESR 97  Review of Systems: As per HPI; otherwise review of systems reviewed and negative.   Ambulatory Status: ambulates with walker  Past Medical History:  Diagnosis Date  . Angina   . Arthritis   . Atrial fibrillation ([place]   . CAD (coronary artery disease)   . Carotid artery occlusion   . Diabetes mellitus   . Diastolic CHF ([place] 128/7867 . Fibromyalgia   . GERD (gastroesophageal reflux disease)   . GI bleed 1/11  . Hyperlipidemia   . Hypertension   . Myocardial infarction ([place] ~ 1991  . Normal nuclear stress test 03/18/2011  . Obesity   . [place]ipheral vascular disease ([place]   . Stroke ([facility]    May 24, 2014  . UTI (lower urinary tract infection) Sept. 2015    Past Surgical History:  Procedure Laterality Date  . ABDOMINAL AORTAGRAM N/A 04/01/2011   Procedure: ABDOMINAL AORTAGRAM;  Surgeon: BConrad Geneva MD;  [facility];  Service: Cardiovascular;  Laterality: N/A;  . ANGIOPLASTY  06/17/11   Left leg common femoral artery cannulation under u/s Left leg runoff  . CATARACT EXTRACTION W/ INTRAOCULAR LENS  IMPLANT, BILATERAL  2004-2005  . CORONARY ARTERY BYPASS GRAFT  2000   CABG X5  . EYE SURGERY    . LOWER EXTREMITY ANGIOGRAM Bilateral 04/01/2011   Procedure: LOWER EXTREMITY ANGIOGRAM;  Surgeon: BConrad Arcola MD;  [facility];  Service: Cardiovascular;  Laterality: Bilateral;  . LOWER EXTREMITY ANGIOGRAM Left 06/17/2011   Procedure: LOWER EXTREMITY ANGIOGRAM;  Surgeon: BConrad Bayshore MD;  [facility];  Service: Cardiovascular;  Laterality: Left;  . LOWER EXTREMITY ANGIOGRAM N/A 11/18/2011   Procedure: LOWER EXTREMITY ANGIOGRAM;  Surgeon: BConrad Rosalie MD;  [facility];  Service: Cardiovascular;  Laterality: N/A;  . PERCUTANEOUS STENT INTERVENTION Right 04/01/2011   Procedure: PERCUTANEOUS STENT INTERVENTION;  Surgeon: BConrad  MD;  [facility];  Service: Cardiovascular;  Laterality: Right;  rt iliac stent  . PERIPHERAL ARTERIAL STENT GRAFT  04/01/11   right common iliac  . TRIGGER FINGER RELEASE  1996   right thumb    Social History  Socioeconomic History  . Marital status: Divorced    Spouse name: Not on file  . Number of children: 3  . Years of education: 24  . Highest education level: Not on file  Occupational History  . Occupation: Retired  Scientific laboratory technician  . Financial resource strain: Not on file  . Food insecurity:    Worry: Not on file    Inability: Not on file  . Transportation needs:    Medical: Not on file    Non-medical: Not on file  Tobacco Use  . Smoking status: Former Smoker    Packs/day: 2.00    Years:  30.00    Pack years: 60.00    Types: Cigarettes    Last attempt to quit: 04/19/1990    Years since quitting: 27.3  . Smokeless tobacco: Never Used  . Tobacco comment: stopped smoking cigarettes 1991  Substance and Sexual Activity  . Alcohol use: No    Alcohol/week: 0.0 oz  . Drug use: No  . Sexual activity: Never  Lifestyle  . Physical activity:    Days per week: Not on file    Minutes per session: Not on file  . Stress: Not on file  Relationships  . Social connections:    Talks on phone: Not on file    Gets together: Not on file    Attends religious service: Not on file    Active member of club or organization: Not on file    Attends meetings of clubs or organizations: Not on file    Relationship status: Not on file  . Intimate partner violence:    Fear of current or ex partner: Not on file    Emotionally abused: Not on file    Physically abused: Not on file    Forced sexual activity: Not on file  Other Topics Concern  . Not on file  Social History Narrative   Divorced.  Native of [place].  Formerly worked as Education administrator person. 01/13/17 lives alone   Caffeine use: drinks decaf tea and coffee    Allergies  Allergen Reactions  . Morphine And Related Nausea And Vomiting    Chest pain    . Lisinopril Cough  . Sertraline Hcl Nausea Only  . Zoloft [Sertraline Hcl] Nausea Only         Family History  Problem Relation Age of Onset  . Other Brother        intestinal blockage  . Diabetes Brother     Prior to Admission medications   Medication Sig Start Date End Date Taking? Authorizing Provider  acetaminophen (TYLENOL) 325 MG tablet Take 650 mg by mouth every 6 (six) hours as needed for headache.   Yes [provider]  amLODipine (NORVASC) 10 MG tablet Take 10 mg by mouth daily. 07/30/17  Yes [provider]  apixaban (ELIQUIS) 5 MG TABS tablet Take 1 tablet (5 mg total) by mouth 2 (two) times daily. 06/03/14  Yes Rai, Ripudeep K, MD  atorvastatin  (LIPITOR) 80 MG tablet Take 1 tablet (80 mg total) by mouth daily. Patient taking differently: Take 80 mg by mouth at bedtime.  11/27/12  Yes Baker, Zachary H, PA-C  furosemide (LASIX) 40 MG tablet Take 40 mg by mouth 2 (two) times daily. 06/30/17  Yes [provider]  glucose 4 GM chewable tablet Chew 4 tablets by mouth once.   Yes [provider]  HUMULIN R 100 UNIT/ML injection Inject 5 units subcutaneously BEFORE BREAKFAST AND DINNER 11/26/16  Yes [provider]  insulin NPH Human (HUMULIN N,NOVOLIN N) 100 UNIT/ML injection Inject 0.38 mLs (38 Units total) into the skin 2 (two) times daily. 38 in am and 34 at night Patient taking differently: Inject 26-57 Units into the skin See admin instructions. Use 37 units every morning and use 22 units every evening 06/03/14  Yes Rai, Ripudeep K, MD  losartan (COZAAR) 50 MG tablet Take 1 tablet (50 mg total) by mouth daily. Patient taking differently: Take 50 mg by mouth 2 (two) times daily.  06/03/14  Yes Rai, Ripudeep K, MD  metoprolol tartrate (LOPRESSOR) 25 MG tablet Take 25 mg by mouth 2 (two) times daily.  04/16/14  Yes [provider]  Multiple Vitamins-Minerals (ONE-A-DAY 50 PLUS PO) Take 1 tablet by mouth daily.   Yes [provider]  mupirocin ointment (BACTROBAN) 2 % Apply daily to right foot ulceration 03/15/17  Yes Stover, Titorya, DPM  NITROSTAT 0.4 MG SL tablet Place 1 tablet under the tongue every 5 (five) minutes x 3 doses as needed for chest pain. Chest pain 03/07/14  Yes [provider]  ondansetron (ZOFRAN ODT) 4 MG disintegrating tablet Take 1 tablet (4 mg total) by mouth every 6 (six) hours as needed. 07/08/17  Yes Ward, Delice Bison, DO  HYDROcodone-acetaminophen (NORCO/VICODIN) 5-325 MG tablet Take 1 tablet by mouth every 6 (six) hours as needed. Patient not taking: Reported on 08/25/2017 07/08/17   Ward, Delice Bison, DO  mupirocin ointment (BACTROBAN) 2 % Apply to affected area  daily. Patient not taking: Reported on 07/06/2017 05/25/17   Landis Martins, DPM    Physical Exam: Vitals:   08/25/17 0730 08/25/17 0800 08/25/17 0845 08/25/17 0900  BP: (!) 167/64 (!) 158/66 (!) 131/107 (!) 141/55  Pulse: 84 85 86 89  Resp: (!) 21 (!) 21 13 (!) 4  Temp:      TempSrc:      SpO2: 96% 92% 92% (!) 85%  Weight:      Height:         General: Appears calm and comfortable and is NAD Eyes: EOMI, normal lids, iris ENT: grossly normal hearing, lips & tongue, mmm; appropriate dentition Neck: no LAD, masses or thyromegaly; no carotid bruits. No JVD Cardiovascular: irregularly irregular with 2/6 SEM. 1+ nonpitting LE edema.  Respiratory:  Decreased inspiratory effort, scattered crackles, no wheeze or rales. Normal respiratory effort. Abdomen:  Obese, soft, NT, ND, NABS Musculoskeletal: grossly normal tone BUE/BLE, good ROM, no bony abnormality Lower extremity:  Limited foot exam with no ulcerations. Bilateral LE erythema appears worse from ED pic on 07/29/17. Erythema to right leg extending past patellar area to medial thigh. Warm to touch. Faint DP pulses bilaterally   Psychiatric: grossly normal mood and affect, speech fluent and appropriate, AOx3 Neurologic: CN 2-12 grossly intact, moves all extremities in coordinated fashion    Radiological Exams on Admission: Dg Chest 2 View  Result Date: 08/25/2017 CLINICAL DATA:  82 year old female with shortness of breath. EXAM: CHEST - 2 VIEW COMPARISON:  Chest radiograph dated 07/08/2017 FINDINGS: There is mild cardiomegaly. There is diffuse interstitial prominence consistent with interstitial edema. Pneumonia is not excluded. Clinical correlation is recommended. Small bilateral pleural effusions may be present. There is no focal consolidation or pneumothorax. Median sternotomy wires noted. No acute osseous pathology. IMPRESSION: Cardiomegaly with interstitial edema and probable small pleural effusions. No focal consolidation.  Electronically Signed   By: Anner Crete M.D.   On: 08/25/2017 04:39   Ct Angio Chest Pe W Or  Wo Contrast  Result Date: 08/25/2017 CLINICAL DATA:  82 year old female with shortness of breath, positive D-dimer and intermediate probability for acute pulmonary embolus EXAM: CT ANGIOGRAPHY CHEST WITH CONTRAST TECHNIQUE: Multidetector CT imaging of the chest was performed using the standard protocol during bolus administration of intravenous contrast. Multiplanar CT image reconstructions and MIPs were obtained to evaluate the vascular anatomy. CONTRAST:  129m ISOVUE-370 IOPAMIDOL (ISOVUE-370) INJECTION 76% COMPARISON:  Chest x-ray 08/25/2017 FINDINGS: Cardiovascular: Adequate opacification of the pulmonary arteries to the proximal subsegmental level. No evidence of focal filling defect to suggest acute pulmonary embolus. The pulmonary artery is normal in size. Three vessel aortic arch. No evidence of aortic aneurysm. Extensive atherosclerotic calcifications present throughout the thoracic aorta and the origins of the branch vessels. Patient is status post prior median sternotomy with multivessel CABG. Cardiomegaly with biatrial enlargement. No pericardial effusion. Mediastinum/Nodes: Unremarkable CT appearance of the thyroid gland. No suspicious mediastinal or hilar adenopathy. No soft tissue mediastinal mass. The thoracic esophagus is unremarkable. Lungs/Pleura: Small right and trace left layering pleural effusions. Biapical pleuroparenchymal scarring is present. Mild interlobular septal thickening with scattered areas of ground-glass attenuation airspace opacity. There is respiratory motion which limits evaluation for pulmonary nodules. Several ground-glass attenuation nodular opacities are present bilaterally which may represent rounded areas of alveolar edema, or a multifocal infectious/inflammatory process. Mild subpleural reticulation in the periphery of the bases consistent with fibrotic change. Bilateral  lower lobe atelectasis secondary to the presence of the pleural effusions. Upper Abdomen: Abnormal hepatic morphology. The contour appears nodular and there is relative hypertrophy of the caudate lobe with atrophy of the right hepatic lobe. Findings suggest underlying cirrhosis. No acute abnormality in the visualized upper abdomen. Musculoskeletal: No acute fracture or aggressive appearing lytic or blastic osseous lesion. Bilateral acromioclavicular joint osteoarthritis. Review of the MIP images confirms the above findings. IMPRESSION: 1. Negative for acute pulmonary embolus. 2. The constellation of findings (cardiomegaly with bilateral layering pleural effusions and pulmonary edema) is most consistent with congestive heart failure. 3. Scattered patchy nodular airspace opacities are present bilaterally. These may represent relatively rounded areas of focal alveolar edema, a superimposed multifocal infectious/inflammatory process, or underlying pulmonary nodules which are partially obscured by the edema. Recommend repeat CT scan of the chest without intravenous contrast when the patient has recovered from the this episode of acute CHF. 4. Pulmonary parenchymal changes suggest mild pulmonary fibrosis in the periphery of the lower lungs. 5. Suspect hepatic cirrhosis. 6.  Aortic Atherosclerosis (ICD10-170.0) Electronically Signed   By: HJacqulynn CadetM.D.   On: 08/25/2017 08:20   Dg Knee Complete 4 Views Left  Result Date: 08/25/2017 CLINICAL DATA:  [id number]-old female with left knee pain and swelling. EXAM: LEFT KNEE - COMPLETE 4+ VIEW COMPARISON:  None. FINDINGS: There is no acute fracture or dislocation. Mild osteopenia. There is mild arthritic changes with narrowing of the medial compartment and meniscal chondrocalcinosis of the lateral compartment. No joint effusion. There is diffuse subcutaneous edema. IMPRESSION: 1. No acute fracture or dislocation. 2. Mild arthritic changes. 3. Diffuse subcutaneous  edema may represent cellulitis. Clinical correlation is recommended. Electronically Signed   By: AAnner CreteM.D.   On: 08/25/2017 04:40    EKG: Independently reviewed. afib with rate 105; nonspecific ST changes with no evidence of acute ischemia   Labs on Admission: I have personally reviewed the available labs and imaging studies at the time of the admission.  Pertinent labs:  Hgb 11.0 otherwise unremarkable CBC Cr 1.07 Glucose 237 BNP  432.3 Trop poc negative x 1 Lactic acid 1.12   Assessment/Plan Principal Problem:   Acute respiratory failure with hypoxia (HCC) Active Problems:   Paroxysmal atrial fibrillation   Diabetes mellitus type 2 with peripheral artery disease (HCC)   Hyperlipidemia   S/P CABG (coronary artery bypass graft)   Peripheral vascular disease, unspecified (HCC)   Stroke (HCC)   Acute exacerbation of Diastolic CHF (congestive heart failure) /EF 60 %   Type II diabetes mellitus with renal manifestations (HCC)   Anemia    Acute hypoxic respiratory failure in setting of decompensated CHF -no evidence of infectious process with normal WBC, normal lactic acid, afebrile and NT appearing -no evidence of PE on CT angio, but c/w decompensated CHF -BNP 432.3 -2DEcho 01/28/17 EF 50-55%, moderate AS. Will repeat this admit -IV Lasix for now, I/O's, daily weights. Monitor BMET -continue home ARB, BB -now maintaining sats on RA after IV Lasix in ED  Cellulitis, Left lower extremity -erythema on admit appears worse than ED visit in 07/2017 despite po Clindamycin. Erythema now extending just past patellar region. Otherwise, exam c/w chronic changes r/t PVD -no evidence of sepsis with normal WBC, lactate -empiric Rocephin for now. Monitor  pAfib -CHA2DS2-VASc = 9. On Eliquis -continue Eliquis and home Metoprolol  Hx CAD s/p CABG -will cycle troponin with above issue  -continue BB, ASA  DM2 -A1C 6.5% 06/11/17 -Lower NPH dose while inpt.  Monitor -SSI  Htn -reasonable control on admit -continue home BB, ARB, Norvasc -Monitor  Hyperlipidemia -continue statin  Hx CVA -on Eliquis, statin  Stage II CKD -baseline Cr ~1.2 (1.07 today) -monitor with diuresis  Anemia, chronic normocytic -stable. Monitor   DVT prophylaxis: on Eliquis Code Status: DNR confirmed with pt Family Communication: none available on admit Disposition Plan: will ask PT/OT to eval for recs. Pt lives alone Consults called: none Admission status: inpt  Patrici Ranks, NP-C [facility] [employer]  pgr [phone]   If note is complete, please contact covering daytime or nighttime physician. [email or web address] Password [email or web address]  08/25/2017, 11:16 AM

## 2017-08-25 NOTE — ED Notes (Signed)
Report called to [place], RN on 5

## 2017-08-26 ENCOUNTER — Inpatient Hospital Stay (HOSPITAL_COMMUNITY): Payer: Medicare Other

## 2017-08-26 DIAGNOSIS — I5032 Chronic diastolic (congestive) heart failure: Secondary | ICD-10-CM

## 2017-08-26 LAB — BASIC METABOLIC PANEL
ANION GAP: 8 (ref 5–15)
BUN: 14 mg/dL (ref 6–20)
CHLORIDE: 103 mmol/L (ref 101–111)
CO2: 27 mmol/L (ref 22–32)
CREATININE: 1.09 mg/dL — AB (ref 0.44–1.00)
Calcium: 8.6 mg/dL — ABNORMAL LOW (ref 8.9–10.3)
GFR calc non Af Amer: 47 mL/min — ABNORMAL LOW (ref >60)
GFR, EST AFRICAN AMERICAN: 54 mL/min — AB (ref >60)
Glucose, Bld: 180 mg/dL — ABNORMAL HIGH (ref 65–99)
POTASSIUM: 3.5 mmol/L (ref 3.5–5.1)
SODIUM: 138 mmol/L (ref 135–145)

## 2017-08-26 LAB — ECHOCARDIOGRAM COMPLETE
Height: 66 in
WEIGHTICAEL: 3342.4 [oz_av]

## 2017-08-26 LAB — GLUCOSE, CAPILLARY
GLUCOSE-CAPILLARY: 182 mg/dL — AB (ref 65–99)
GLUCOSE-CAPILLARY: 277 mg/dL — AB (ref 65–99)
GLUCOSE-CAPILLARY: 250 mg/dL — AB (ref 65–99)
Glucose-Capillary: 303 mg/dL — ABNORMAL HIGH (ref 65–99)

## 2017-08-26 LAB — TROPONIN I: TROPONIN I: 0.06 ng/mL — AB (ref <0.03)

## 2017-08-26 MED ORDER — FUROSEMIDE 10 MG/ML IJ SOLN
40.0000 mg | Freq: Two times a day (BID) | INTRAMUSCULAR | Status: DC
  Administered 2017-08-26: 40 mg via INTRAVENOUS
  Filled 2017-08-26: qty 4

## 2017-08-26 NOTE — Progress Notes (Signed)
  Echocardiogram 2D Echocardiogram has been performed.  Kristina Summers 08/26/2017, 10:00 AM

## 2017-08-26 NOTE — Evaluation (Signed)
Occupational Therapy Evaluation Patient Details Name: Kristina Summers MRN: [id number] DOB: 04-27-1935 Today's Date: 08/26/2017    History of Present Illness 82 y.o. female with a PMHx: CVA; PVD; CAD; HTN; HLD; diastolic CHF; DM; and afib on Eliquis admited with SOB with hypoxia and left LE cellulitis with acute CHF   Clinical Impression   Pt at baseline independent level of function with ADLs and ADL mobility using no AD, no LOB. All education completed and no further acute OT indicated at this time    Follow Up Recommendations  No OT follow up    Equipment Recommendations  None recommended by OT    Recommendations for Other Services       Precautions / Restrictions Precautions Precautions: None Restrictions Weight Bearing Restrictions: No      Mobility Bed Mobility Overal bed mobility: Modified Independent             General bed mobility comments: pt up in recliner upon arrival  Transfers Overall transfer level: Modified independent Equipment used: None             General transfer comment: Pt performed sit<>stand without AD with no instability noted    Balance Overall balance assessment: Needs assistance Sitting-balance support: No upper extremity supported Sitting balance-Leahy Scale: Good     Standing balance support: No upper extremity supported   Standing balance comment: Pt able to stand safely without AD     Tandem Stance - Right Leg: 0 Tandem Stance - Left Leg: 0 Rhomberg - Eyes Opened: 10 Rhomberg - Eyes Closed: 10 High level balance activites: Turns;Sudden stops;Head turns High Level Balance Comments: See gait assessment above           ADL either performed or assessed with clinical judgement   ADL Overall ADL's : At baseline;Independent                                             Vision Baseline Vision/History: Wears glasses Wears Glasses: Reading only Patient Visual Report: No change from baseline        Perception     Praxis      Pertinent Vitals/Pain Pain Assessment: No/denies pain     Hand Dominance Right   Extremity/Trunk Assessment Upper Extremity Assessment Upper Extremity Assessment: Overall WFL for tasks assessed   Lower Extremity Assessment Lower Extremity Assessment: Defer to PT evaluation   Cervical / Trunk Assessment Cervical / Trunk Assessment: Kyphotic   Communication Communication Communication: No difficulties   Cognition Arousal/Alertness: Awake/alert Behavior During Therapy: WFL for tasks assessed/performed Overall Cognitive Status: Within Functional Limits for tasks assessed                                     General Comments  LE swelling and edema noted. Legs elevated upon leaving pt in chair    Exercises     Shoulder Instructions      Home Living Family/patient expects to be discharged to:: Private residence Living Arrangements: Alone Available Help at Discharge: Family;Available PRN/intermittently Type of Home: Apartment Home Access: Stairs to enter Entrance Stairs-Number of Steps: 1 Entrance Stairs-Rails: None Home Layout: One level     Bathroom Shower/Tub: Teacher, early years/pre: Standard     Home Equipment: Environmental consultant - 4 wheels;Cane - single point;Tub bench  Prior Functioning/Environment Level of Independence: Independent with assistive device(s)        Comments: pt walks with rollator in home and cane at times if daughter is with her        OT Problem List: Impaired balance (sitting and/or standing);Decreased activity tolerance      OT Treatment/Interventions:      OT Goals(Current goals can be found in the care plan section) Acute Rehab OT Goals Patient Stated Goal: To return home OT Goal Formulation: With patient  OT Frequency:     Barriers to D/C:    no barriers       Co-evaluation              AM-PAC PT "6 Clicks" Daily Activity     Outcome Measure Help from  another person eating meals?: None Help from another person taking care of personal grooming?: None Help from another person toileting, which includes using toliet, bedpan, or urinal?: None Help from another person bathing (including washing, rinsing, drying)?: None Help from another person to put on and taking off regular upper body clothing?: None Help from another person to put on and taking off regular lower body clothing?: None 6 Click Score: 24   End of Session    Activity Tolerance: Patient tolerated treatment well Patient left: in chair;with call bell/phone within reach  OT Visit Diagnosis: Other abnormalities of gait and mobility (R26.89)                Time: 4599-7741 OT Time Calculation (min): 26 min Charges:  OT General Charges $OT Visit: 1 Visit OT Evaluation $OT Eval Low Complexity: 1 Low OT Treatments $Therapeutic Activity: 8-22 mins G-Codes: OT G-codes **NOT FOR INPATIENT CLASS** Functional Assessment Tool Used: AM-PAC 6 Clicks Daily Activity     Britt Bottom 08/26/2017, 12:08 PM

## 2017-08-26 NOTE — Progress Notes (Signed)
Pt troponin 0.06, was 0.05, no s/s, will continue to monitor, MD notified, Thanks Arvella Nigh RN.

## 2017-08-26 NOTE — Progress Notes (Signed)
PROGRESS NOTE    Merl Guardino  BOF:[id number] DOB: 05-07-1935 DOA: 08/25/2017 PCP: Rogers Blocker, MD      Brief Narrative:  Mrs. Mcglade is a 82 y.o. F with chronic diastolic CHF, pAF on Eliquis, PVD, HTN, IDDM, CAD s/p CABG, hx of CVA who presents with dyspnea on exertion and leg swelling, found to have edema on CXR and elevated BNP, started on IV Lasix and admitted for CHF and bilateral lower extremity cellulitis.      Assessment & Plan:  Acute on chronic diastolic CHF -Furosemide 40 mg IV twice a day  -K supplement -Strict I/Os, daily weights, telemetry  -Daily monitoring renal function -Obtain echocardiogram   Chronic venous stasis Without fever, chills or leukocytosis nor other signs/symptoms of infection, I will hold antibiotics and monitor the redness in her legs, which appears to have only now improved with diuresis. -Stop ceftriaxone  Hypertension -Continue amlodipine, losartan  Paroxysmal atrial fibrillation CHADS2Vasc 9 -Continue apixaban  History CVA Peripheral vascular disease -Continue apixaban, statin  Diabetes -Continue NPH -Continue SSI corrections   DVT prophylaxis: N/A on apixaban Code Status: DO NOT RESUSCITATE Family Communication: None presenet MDM and disposition Plan: The below labs and imaging reports were reviewed.  The patient's status is clinically improving.  She was admitted for a severe CHF flare, which is now improving.  Her echo is pending.  She will continue on IV diuresis and we will monitor electrolytes closely.     Consultants:   None  Procedures:   Echocardiogram  Antimicrobials:   Ceftriaxone x1    Subjective: Feels beter.  Breathing is better.  No confusion, dizziness.  No sputum, cough, chest pain.  Leg swelling is better.  Orthopnea is better.    Objective: Vitals:   08/26/17 0604 08/26/17 0954 08/26/17 1025 08/26/17 1214  BP: (!) 152/55 139/70  118/67  Pulse: 90 88 91 94  Resp: 20     Temp: 99.1 F (37.3  C)   98.1 F (36.7 C)  TempSrc: Oral   Oral  SpO2: 96%  94% 94%  Weight: 94.8 kg (208 lb 14.4 oz)     Height:        Intake/Output Summary (Last 24 hours) at 08/26/2017 1624 Last data filed at 08/26/2017 0957 Gross per 24 hour  Intake 920 ml  Output 1200 ml  Net -280 ml   Filed Weights   08/25/17 0317 08/25/17 1443 08/26/17 0604  Weight: 96.2 kg (212 lb) 95.7 kg (210 lb 14.4 oz) 94.8 kg (208 lb 14.4 oz)    Examination: General appearance: Elderly overweight adult female, alert and in no acute distress.  Sitting in recliner. HEENT: Anicteric, conjunctiva pink, lids and lashes normal. No nasal deformity, discharge, epistaxis.  Lips moist, teeth normal, OP moist, no oral lesions.   Skin: Warm and dry.  No suspicious rashes or lesions.  Chronic venous stasis changes to legs, no redness or induration to suggest infection. Cardiac: RRR, nl D7-O2, systolic murmur noted.  Capillary refill is brisk.  JVP not visible.  Chronic venous stasis change, brawny.  Some pitting LE edema.  Radial pulses 2+ and symmetric. Respiratory: Normal respiratory rate and rhythm.  CTAB without rales or wheezes. Abdomen: Abdomen soft.  No TTP. No ascites, distension, hepatosplenomegaly.   MSK: No deformities or effusions. Neuro: Awake and alert.  EOMI, moves all extremities. Speech fluent.    Psych: Sensorium intact and responding to questions, attention normal. Affect normal.  Judgment and insight appear normal.  Data Reviewed: I have personally reviewed following labs and imaging studies:  CBC: Recent Labs  Lab 08/25/17 0410  WBC 9.5  NEUTROABS 7.0  HGB 11.0*  HCT 32.2*  MCV 94.4  PLT 734   Basic Metabolic Panel: Recent Labs  Lab 08/25/17 0410 08/26/17 0259  NA 137 138  K 3.7 3.5  CL 103 103  CO2 26 27  GLUCOSE 237* 180*  BUN 16 14  CREATININE 1.07* 1.09*  CALCIUM 8.8* 8.6*   GFR: Estimated Creatinine Clearance: 48.6 mL/min (A) (by C-G formula based on SCr of 1.09 mg/dL (H)). Liver  Function Tests: Recent Labs  Lab 08/25/17 0410  AST 43*  ALT 27  ALKPHOS 135*  BILITOT 1.3*  PROT 7.2  ALBUMIN 2.5*   No results for input(s): LIPASE, AMYLASE in the last 168 hours. No results for input(s): AMMONIA in the last 168 hours. Coagulation Profile: No results for input(s): INR, PROTIME in the last 168 hours. Cardiac Enzymes: Recent Labs  Lab 08/25/17 1541 08/25/17 2143 08/26/17 0259  TROPONINI 0.05* 0.05* 0.06*   BNP (last 3 results) No results for input(s): PROBNP in the last 8760 hours. HbA1C: No results for input(s): HGBA1C in the last 72 hours. CBG: Recent Labs  Lab 08/25/17 1631 08/25/17 2134 08/26/17 0746 08/26/17 1213 08/26/17 1608  GLUCAP 214* 188* 182* 277* 250*   Lipid Profile: No results for input(s): CHOL, HDL, LDLCALC, TRIG, CHOLHDL, LDLDIRECT in the last 72 hours. Thyroid Function Tests: No results for input(s): TSH, T4TOTAL, FREET4, T3FREE, THYROIDAB in the last 72 hours. Anemia Panel: No results for input(s): VITAMINB12, FOLATE, FERRITIN, TIBC, IRON, RETICCTPCT in the last 72 hours. Urine analysis:    Component Value Date/Time   COLORURINE YELLOW 07/08/2017 1101   APPEARANCEUR CLOUDY (A) 07/08/2017 1101   LABSPEC 1.010 07/08/2017 1101   PHURINE 7.0 07/08/2017 1101   GLUCOSEU NEGATIVE 07/08/2017 1101   HGBUR MODERATE (A) 07/08/2017 1101   BILIRUBINUR NEGATIVE 07/08/2017 1101   KETONESUR NEGATIVE 07/08/2017 1101   PROTEINUR >=300 (A) 07/08/2017 1101   UROBILINOGEN 1.0 05/29/2014 1052   NITRITE POSITIVE (A) 07/08/2017 1101   LEUKOCYTESUR LARGE (A) 07/08/2017 1101   Sepsis Labs: @LABRCNTIP (procalcitonin:4,lacticacidven:4)  ) Recent Results (from the past 240 hour(s))  Culture, blood (Routine X 2) w Reflex to ID Panel     Status: None (Preliminary result)   Collection Time: 08/25/17  8:28 AM  Result Value Ref Range Status   Specimen Description BLOOD LEFT ANTECUBITAL  Final   Special Requests   Final    BOTTLES DRAWN AEROBIC  AND ANAEROBIC Blood Culture results may not be optimal due to an excessive volume of blood received in culture bottles   Culture   Final    NO GROWTH < 24 HOURS Performed at [facility] [address] [place], [place] [address]    Report Status PENDING  Incomplete  Culture, blood (Routine X 2) w Reflex to ID Panel     Status: None (Preliminary result)   Collection Time: 08/25/17  8:29 AM  Result Value Ref Range Status   Specimen Description BLOOD RIGHT FOREARM  Final   Special Requests   Final    BOTTLES DRAWN AEROBIC AND ANAEROBIC Blood Culture results may not be optimal due to an excessive volume of blood received in culture bottles   Culture   Final    NO GROWTH < 24 HOURS Performed at [facility], [place] [address], [place], [place] [address]    Report  Status PENDING  Incomplete         Radiology Studies: Dg Chest 2 View  Result Date: 08/25/2017 CLINICAL DATA:  82 year old female with shortness of breath. EXAM: CHEST - 2 VIEW COMPARISON:  Chest radiograph dated 07/08/2017 FINDINGS: There is mild cardiomegaly. There is diffuse interstitial prominence consistent with interstitial edema. Pneumonia is not excluded. Clinical correlation is recommended. Small bilateral pleural effusions may be present. There is no focal consolidation or pneumothorax. Median sternotomy wires noted. No acute osseous pathology. IMPRESSION: Cardiomegaly with interstitial edema and probable small pleural effusions. No focal consolidation. Electronically Signed   By: Anner Crete M.D.   On: 08/25/2017 04:39   Ct Angio Chest Pe W Or Wo Contrast  Result Date: 08/25/2017 CLINICAL DATA:  82 year old female with shortness of breath, positive D-dimer and intermediate probability for acute pulmonary embolus EXAM: CT ANGIOGRAPHY CHEST WITH CONTRAST TECHNIQUE: Multidetector CT imaging of the chest was performed using the standard protocol during bolus administration of intravenous contrast. Multiplanar CT  image reconstructions and MIPs were obtained to evaluate the vascular anatomy. CONTRAST:  168mL ISOVUE-370 IOPAMIDOL (ISOVUE-370) INJECTION 76% COMPARISON:  Chest x-ray 08/25/2017 FINDINGS: Cardiovascular: Adequate opacification of the pulmonary arteries to the proximal subsegmental level. No evidence of focal filling defect to suggest acute pulmonary embolus. The pulmonary artery is normal in size. Three vessel aortic arch. No evidence of aortic aneurysm. Extensive atherosclerotic calcifications present throughout the thoracic aorta and the origins of the branch vessels. Patient is status post prior median sternotomy with multivessel CABG. Cardiomegaly with biatrial enlargement. No pericardial effusion. Mediastinum/Nodes: Unremarkable CT appearance of the thyroid gland. No suspicious mediastinal or hilar adenopathy. No soft tissue mediastinal mass. The thoracic esophagus is unremarkable. Lungs/Pleura: Small right and trace left layering pleural effusions. Biapical pleuroparenchymal scarring is present. Mild interlobular septal thickening with scattered areas of ground-glass attenuation airspace opacity. There is respiratory motion which limits evaluation for pulmonary nodules. Several ground-glass attenuation nodular opacities are present bilaterally which may represent rounded areas of alveolar edema, or a multifocal infectious/inflammatory process. Mild subpleural reticulation in the periphery of the bases consistent with fibrotic change. Bilateral lower lobe atelectasis secondary to the presence of the pleural effusions. Upper Abdomen: Abnormal hepatic morphology. The contour appears nodular and there is relative hypertrophy of the caudate lobe with atrophy of the right hepatic lobe. Findings suggest underlying cirrhosis. No acute abnormality in the visualized upper abdomen. Musculoskeletal: No acute fracture or aggressive appearing lytic or blastic osseous lesion. Bilateral acromioclavicular joint  osteoarthritis. Review of the MIP images confirms the above findings. IMPRESSION: 1. Negative for acute pulmonary embolus. 2. The constellation of findings (cardiomegaly with bilateral layering pleural effusions and pulmonary edema) is most consistent with congestive heart failure. 3. Scattered patchy nodular airspace opacities are present bilaterally. These may represent relatively rounded areas of focal alveolar edema, a superimposed multifocal infectious/inflammatory process, or underlying pulmonary nodules which are partially obscured by the edema. Recommend repeat CT scan of the chest without intravenous contrast when the patient has recovered from the this episode of acute CHF. 4. Pulmonary parenchymal changes suggest mild pulmonary fibrosis in the periphery of the lower lungs. 5. Suspect hepatic cirrhosis. 6.  Aortic Atherosclerosis (ICD10-170.0) Electronically Signed   By: Jacqulynn Cadet M.D.   On: 08/25/2017 08:20   Dg Knee Complete 4 Views Left  Result Date: 08/25/2017 CLINICAL DATA:  [age]-year-old female with left knee pain and swelling. EXAM: LEFT KNEE - COMPLETE 4+ VIEW COMPARISON:  None. FINDINGS: There is no acute  fracture or dislocation. Mild osteopenia. There is mild arthritic changes with narrowing of the medial compartment and meniscal chondrocalcinosis of the lateral compartment. No joint effusion. There is diffuse subcutaneous edema. IMPRESSION: 1. No acute fracture or dislocation. 2. Mild arthritic changes. 3. Diffuse subcutaneous edema may represent cellulitis. Clinical correlation is recommended. Electronically Signed   By: Anner Crete M.D.   On: 08/25/2017 04:40        Scheduled Meds: . amLODipine  10 mg Oral Daily  . apixaban  5 mg Oral BID  . atorvastatin  80 mg Oral Daily  . insulin aspart  0-9 Units Subcutaneous TID WC  . insulin NPH Human  22 Units Subcutaneous BID AC & HS  . losartan  50 mg Oral Daily  . mupirocin ointment   Topical Daily  . sodium chloride  flush  3 mL Intravenous Q12H   Continuous Infusions: . sodium chloride       LOS: 1 day    Time spent: 25 minutes    Edwin Dada, MD [facility] 08/26/2017, 11:45 AM     Pager [phone] --- please page though AMION:  [email or web address] If 7PM-7AM, please contact night-coverage

## 2017-08-26 NOTE — Evaluation (Signed)
Physical Therapy Evaluation/Discharge Patient Details Name: Kristina Summers MRN: [id number] DOB: 1935/12/03 Today's Date: 08/26/2017   History of Present Illness  82 y.o. female with a PMHx: CVA; PVD; CAD; HTN; HLD; diastolic CHF; DM; and afib on Eliquis admited with SOB with hypoxia and left LE cellulitis with acute CHF  Clinical Impression  Pt pleasant and agreeable to treatment. Pt demonstrates functional mobility at baseline level and reports no falls in the past year. Pt had difficulty with high level balance activity but pt demonstrates good safety awareness and safe mobilty currently. Pt agreeable to D/C from PT and no other acute needs present    Follow Up Recommendations No PT follow up    Equipment Recommendations  None recommended by PT    Recommendations for Other Services       Precautions / Restrictions Precautions Precautions: None Restrictions Weight Bearing Restrictions: No      Mobility  Bed Mobility Overal bed mobility: Modified Independent             General bed mobility comments: Pt able to perform supine>sit EOB with bed flat and no use of hand rail. Increased time required. Pt scooted hips to edge of bed independently  Transfers Overall transfer level: Modified independent Equipment used: None             General transfer comment: Pt performed sit<>stand without AD with no instability noted  Ambulation/Gait Ambulation/Gait assistance: Modified independent (Device/Increase time) Ambulation Distance (Feet): 300 Feet Assistive device: Rolling walker (2 wheeled);None Gait Pattern/deviations: Step-through pattern;WFL(Within Functional Limits)   Gait velocity interpretation: 1.31 - 2.62 ft/sec, indicative of limited community ambulator General Gait Details: Pt ambulated ~150 feet with RW and ~150 ft with no AD. Pt Mod I for all ambulation and displayed no evidence of instability in gait. Pt gait speed slowed during head turn activities but no  instability present. Pt drifted to R slightly when ambulating without device and turning head to R.   Stairs            Wheelchair Mobility    Modified Rankin (Stroke Patients Only)       Balance Overall balance assessment: Needs assistance Sitting-balance support: No upper extremity supported Sitting balance-Leahy Scale: Good     Standing balance support: No upper extremity supported   Standing balance comment: Pt able to stand safely without AD     Tandem Stance - Right Leg: 0 Tandem Stance - Left Leg: 0 Rhomberg - Eyes Opened: 10 Rhomberg - Eyes Closed: 10 High level balance activites: Turns;Sudden stops;Head turns High Level Balance Comments: See gait assessment above             Pertinent Vitals/Pain Pain Assessment: No/denies pain    Home Living Family/patient expects to be discharged to:: Private residence Living Arrangements: Alone Available Help at Discharge: Family;Available PRN/intermittently Type of Home: Apartment Home Access: Stairs to enter Entrance Stairs-Rails: None Entrance Stairs-Number of Steps: 1 Home Layout: One level Home Equipment: Walker - 4 wheels;Cane - single point      Prior Function Level of Independence: Independent with assistive device(s)         Comments: pt walks with rollator in home and cane at times if daughter is with her     Hand Dominance        Extremity/Trunk Assessment   Upper Extremity Assessment Upper Extremity Assessment: Generalized weakness    Lower Extremity Assessment Lower Extremity Assessment: Generalized weakness    Cervical / Trunk Assessment Cervical / Trunk  Assessment: Kyphotic  Communication   Communication: No difficulties  Cognition Arousal/Alertness: Awake/alert Behavior During Therapy: WFL for tasks assessed/performed Overall Cognitive Status: Within Functional Limits for tasks assessed                                        General Comments General  comments (skin integrity, edema, etc.): LE swelling and edema noted. Legs elevated upon leaving pt in chair    Exercises     Assessment/Plan    PT Assessment Patent does not need any further PT services  PT Problem List         PT Treatment Interventions      PT Goals (Current goals can be found in the Care Plan section)  Acute Rehab PT Goals Patient Stated Goal: To return home PT Goal Formulation: With patient Time For Goal Achievement: 09/09/17 Potential to Achieve Goals: Good    Frequency     Barriers to discharge        Co-evaluation               AM-PAC PT "6 Clicks" Daily Activity  Outcome Measure Difficulty turning over in bed (including adjusting bedclothes, sheets and blankets)?: None Difficulty moving from lying on back to sitting on the side of the bed? : None Difficulty sitting down on and standing up from a chair with arms (e.g., wheelchair, bedside commode, etc,.)?: None Help needed moving to and from a bed to chair (including a wheelchair)?: None Help needed walking in hospital room?: A Little Help needed climbing 3-5 steps with a railing? : A Little 6 Click Score: 22    End of Session Equipment Utilized During Treatment: Gait belt Activity Tolerance: Patient tolerated treatment well Patient left: in chair;with chair alarm set;with call bell/phone within reach   PT Visit Diagnosis: Other abnormalities of gait and mobility (R26.89)    Time: 8250-0370 PT Time Calculation (min) (ACUTE ONLY): 25 min   Charges:   PT Evaluation $PT Eval Low Complexity: 1 Low     PT G Codes:        Gabe Terez Freimark, SPT  [employer] 08/26/2017, 10:42 AM

## 2017-08-26 NOTE — Plan of Care (Signed)
  Problem: Nutrition: Goal: Adequate nutrition will be maintained Outcome: Completed/Met   Problem: Coping: Goal: Level of anxiety will decrease Outcome: Completed/Met   Problem: Pain Managment: Goal: General experience of comfort will improve Outcome: Completed/Met   

## 2017-08-27 ENCOUNTER — Encounter (HOSPITAL_COMMUNITY): Payer: Self-pay | Admitting: Student

## 2017-08-27 DIAGNOSIS — I4891 Unspecified atrial fibrillation: Secondary | ICD-10-CM

## 2017-08-27 DIAGNOSIS — I739 Peripheral vascular disease, unspecified: Secondary | ICD-10-CM

## 2017-08-27 DIAGNOSIS — I350 Nonrheumatic aortic (valve) stenosis: Secondary | ICD-10-CM

## 2017-08-27 DIAGNOSIS — N183 Chronic kidney disease, stage 3 (moderate): Secondary | ICD-10-CM

## 2017-08-27 DIAGNOSIS — E1122 Type 2 diabetes mellitus with diabetic chronic kidney disease: Secondary | ICD-10-CM

## 2017-08-27 DIAGNOSIS — E1151 Type 2 diabetes mellitus with diabetic peripheral angiopathy without gangrene: Secondary | ICD-10-CM

## 2017-08-27 DIAGNOSIS — Z794 Long term (current) use of insulin: Secondary | ICD-10-CM

## 2017-08-27 DIAGNOSIS — I482 Chronic atrial fibrillation: Secondary | ICD-10-CM

## 2017-08-27 DIAGNOSIS — I5033 Acute on chronic diastolic (congestive) heart failure: Secondary | ICD-10-CM

## 2017-08-27 DIAGNOSIS — D509 Iron deficiency anemia, unspecified: Secondary | ICD-10-CM

## 2017-08-27 LAB — GLUCOSE, CAPILLARY
GLUCOSE-CAPILLARY: 123 mg/dL — AB (ref 65–99)
Glucose-Capillary: 168 mg/dL — ABNORMAL HIGH (ref 65–99)
Glucose-Capillary: 216 mg/dL — ABNORMAL HIGH (ref 65–99)
Glucose-Capillary: 306 mg/dL — ABNORMAL HIGH (ref 65–99)

## 2017-08-27 LAB — BASIC METABOLIC PANEL
Anion gap: 8 (ref 5–15)
BUN: 20 mg/dL (ref 6–20)
CALCIUM: 8.6 mg/dL — AB (ref 8.9–10.3)
CO2: 30 mmol/L (ref 22–32)
CREATININE: 1.21 mg/dL — AB (ref 0.44–1.00)
Chloride: 100 mmol/L — ABNORMAL LOW (ref 101–111)
GFR calc non Af Amer: 41 mL/min — ABNORMAL LOW (ref >60)
GFR, EST AFRICAN AMERICAN: 48 mL/min — AB (ref >60)
GLUCOSE: 150 mg/dL — AB (ref 65–99)
Potassium: 3 mmol/L — ABNORMAL LOW (ref 3.5–5.1)
Sodium: 138 mmol/L (ref 135–145)

## 2017-08-27 MED ORDER — METOPROLOL TARTRATE 25 MG PO TABS
25.0000 mg | ORAL_TABLET | Freq: Two times a day (BID) | ORAL | Status: DC
  Administered 2017-08-27 – 2017-08-29 (×4): 25 mg via ORAL
  Filled 2017-08-27 (×4): qty 1

## 2017-08-27 MED ORDER — POTASSIUM CHLORIDE CRYS ER 20 MEQ PO TBCR
40.0000 meq | EXTENDED_RELEASE_TABLET | Freq: Two times a day (BID) | ORAL | Status: DC
  Administered 2017-08-27 – 2017-08-29 (×5): 40 meq via ORAL
  Filled 2017-08-27 (×5): qty 2

## 2017-08-27 MED ORDER — FUROSEMIDE 10 MG/ML IJ SOLN
60.0000 mg | Freq: Two times a day (BID) | INTRAMUSCULAR | Status: DC
  Administered 2017-08-27 – 2017-08-29 (×5): 60 mg via INTRAVENOUS
  Filled 2017-08-27 (×5): qty 6

## 2017-08-27 NOTE — Progress Notes (Signed)
Patient currently sitting on reliner and refused bed alarm. Has had no alarm during prior shift. Will continue to monitor patient.

## 2017-08-27 NOTE — Progress Notes (Signed)
PROGRESS NOTE    Kristina Summers  WUJ:[id number] DOB: 18-Jun-1935 DOA: 08/25/2017 PCP: Rogers Blocker, MD      Brief Narrative:  Kristina Summers is a 82 y.o. F with chronic diastolic CHF, pAF on Eliquis, PVD, HTN, IDDM, CAD s/p CABG, hx of CVA who presents with dyspnea on exertion and leg swelling, found to have edema on CXR and elevated BNP, started on IV Lasix and admitted for CHF and bilateral lower extremity cellulitis.      Assessment & Plan:  Acute on chronic diastolic CHF Weight up 2lbs from admission, but net negative 400cc on I/Os.  Cr stable.  Still pitting edema on exam.Echo shows EF 50-55% with progression of AS. -Continue furosemide -Continue K -Strict I/Os, daily weights, telemetry  -Daily monitoring renal function   Aortic stenosis -Consult Cardiology, appreciate cares  Chronic venous stasis Without fever, chills or leukocytosis nor other signs/symptoms of infection, I will hold antibiotics and monitor the redness in her legs, which appears to have only now improved with diuresis.  No evidence of worsening off antibiotics, no fever, chills.  Hypertension -Continue amlodipine, losartan  Paroxysmal atrial fibrillation CHADS2Vasc 9 -Continue apixaban  History CVA Peripheral vascular disease -Continue apixaban, statin  Diabetes Glucoses good -Continue NPH, SSI corrections       DVT prophylaxis: N/A on apixaban Code Status: DO NOT RESUSCITATE Family Communication: None presenet MDM and disposition Plan: Below labs and imaging reports reviewed.  Patient is clinically improving.  She was admitted for severe flare of her CHF, which is improving.  Echo showed aortic stenosis, which cardiology was consulted.  We will continue IV diuresis as she still has some evidence of fluid overload on exam.  Reevaluate tomorrow, either home tomorrow or the next day likely without PT OT f/u needed.    Consultants:   Cardiology  Procedures:   Echocardiogram 5/10 Study  Conclusions  - Left ventricle: The cavity size was normal. Wall thickness was   increased in a pattern of mild LVH. Systolic function was normal.   The estimated ejection fraction was in the range of 50% to 55%.   Suspected diastolic dysfunction and elevated LV filling pressure   (a-fib is noted). - Aortic valve: Calcified with restricted leaflet motion. There is   likely severe stenosis. Trivial regurgitation. Mean gradient (S):   21 mm Hg. Peak gradient (S): 34 mm Hg. Valve area (VTI): 0.9   cm^2. Valve area (Vmax): 0.81 cm^2. Valve area (Vmean): 0.83   cm^2. - Mitral valve: Calcified annulus. Mildly thickened leaflets . - Left atrium: Severely dilated. - Right ventricle: The cavity size was mildly dilated. Moderately   reduced systolic function. - Right atrium: The atrium was mildly dilated. - Inferior vena cava: The vessel was dilated. The respirophasic   diameter changes were blunted (< 50%), consistent with elevated   central venous pressure.  Impressions:  - Compared to a prior study in 01/2017, the LVEF is unchanged.   There is now likely severe aortic stenosis with an AVA around 0.9   cm2, there is severe LAE and diastolic dysfunction with elevated   LV filling pressure.   Antimicrobials:   Ceftriaxone x1    Subjective: Feels quite well.  Breathing is better.  No orthopnea.  Able to walk in the hall without symptoms.  She still has some swelling in her legs, although this is improved.  No new fever, chills, left leg pain, redness, swelling of the left leg over the right.  Objective: Vitals:  08/26/17 1214 08/26/17 2109 08/27/17 0527 08/27/17 1119  BP: 118/67 (!) 148/57 131/63 (!) 153/70  Pulse: 94 73 78 (!) 30  Resp:  18 17 18   Temp: 98.1 F (36.7 C) 98.1 F (36.7 C) 98.2 F (36.8 C) 97.6 F (36.4 C)  TempSrc: Oral Oral Oral Oral  SpO2: 94% 92% 92% 94%  Weight:   94.9 kg (209 lb 3.2 oz)   Height:        Intake/Output Summary (Last 24 hours) at  08/27/2017 1436 Last data filed at 08/27/2017 0900 Gross per 24 hour  Intake 480 ml  Output 900 ml  Net -420 ml   Filed Weights   08/25/17 1443 08/26/17 0604 08/27/17 0527  Weight: 95.7 kg (210 lb 14.4 oz) 94.8 kg (208 lb 14.4 oz) 94.9 kg (209 lb 3.2 oz)    Examination: General appearance: Kristina Summers female, sitting up in recliner, watching television, interactive.  No acute distress. HEENT: Sclera anicteric, conjunctival pink, lids and lashes normal.  No nasal deformity, discharge, or epistaxis.  Lips and teeth normal.  Oropharynx moist, no oral lesions. Skin: Skin is warm and dry, no suspicious rashes or lesions.  She is chronically sustaining changes of the legs, bilateral wine bottle deformities, there is no excess redness on the left leg to suggest infection. Cardiac: RRR, systolic murmur.  Normal JVP, pitting edema at the knee. Respiratory: Kristina Summers effort normal, lungs clear without rales or wheezes. Abdomen: Abdomen soft.  No TTP. No ascites, distension, hepatosplenomegaly.   MSK: No deformities or effusions. Neuro: Kristina Summers is awake and alert, extra ocular movements are intact, pupils are equal round and reactive to light, she moves all extremities with equal strength and coordination.  Speech is fluent.    Psych: Sensorium is intact and she is responding to questions with normal attention.  Her affect is normal.  Her judgment and insight appear normal.    Data Reviewed: I have personally reviewed following labs and imaging studies:  CBC: Recent Labs  Lab 08/25/17 0410  WBC 9.5  NEUTROABS 7.0  HGB 11.0*  HCT 32.2*  MCV 94.4  PLT 161   Basic Metabolic Panel: Recent Labs  Lab 08/25/17 0410 08/26/17 0259 08/27/17 0519  NA 137 138 138  K 3.7 3.5 3.0*  CL 103 103 100*  CO2 26 27 30   GLUCOSE 237* 180* 150*  BUN 16 14 20   CREATININE 1.07* 1.09* 1.21*  CALCIUM 8.8* 8.6* 8.6*   GFR: Estimated Creatinine Clearance: 43.7 mL/min (A) (by C-G formula based on SCr of 1.21 mg/dL  (H)). Liver Function Tests: Recent Labs  Lab 08/25/17 0410  AST 43*  ALT 27  ALKPHOS 135*  BILITOT 1.3*  PROT 7.2  ALBUMIN 2.5*   No results for input(s): LIPASE, AMYLASE in the last 168 hours. No results for input(s): AMMONIA in the last 168 hours. Coagulation Profile: No results for input(s): INR, PROTIME in the last 168 hours. Cardiac Enzymes: Recent Labs  Lab 08/25/17 1541 08/25/17 2143 08/26/17 0259  TROPONINI 0.05* 0.05* 0.06*   BNP (last 3 results) No results for input(s): PROBNP in the last 8760 hours. HbA1C: No results for input(s): HGBA1C in the last 72 hours. CBG: Recent Labs  Lab 08/26/17 1213 08/26/17 1608 08/26/17 2132 08/27/17 0747 08/27/17 1250  GLUCAP 277* 250* 303* 123* 168*   Lipid Profile: No results for input(s): CHOL, HDL, LDLCALC, TRIG, CHOLHDL, LDLDIRECT in the last 72 hours. Thyroid Function Tests: No results for input(s): TSH, T4TOTAL, FREET4, T3FREE, THYROIDAB in the  last 72 hours. Anemia Panel: No results for input(s): VITAMINB12, FOLATE, FERRITIN, TIBC, IRON, RETICCTPCT in the last 72 hours. Urine analysis:    Component Value Date/Time   COLORURINE YELLOW 07/08/2017 1101   APPEARANCEUR CLOUDY (A) 07/08/2017 1101   LABSPEC 1.010 07/08/2017 1101   PHURINE 7.0 07/08/2017 1101   GLUCOSEU NEGATIVE 07/08/2017 1101   HGBUR MODERATE (A) 07/08/2017 1101   BILIRUBINUR NEGATIVE 07/08/2017 1101   KETONESUR NEGATIVE 07/08/2017 1101   PROTEINUR >=300 (A) 07/08/2017 1101   UROBILINOGEN 1.0 05/29/2014 1052   NITRITE POSITIVE (A) 07/08/2017 1101   LEUKOCYTESUR LARGE (A) 07/08/2017 1101   Sepsis Labs: @LABRCNTIP (procalcitonin:4,lacticacidven:4)  ) Recent Results (from the past 240 hour(s))  Culture, blood (Routine X 2) w Reflex to ID Panel     Status: None (Preliminary result)   Collection Time: 08/25/17  8:28 AM  Result Value Ref Range Status   Specimen Description BLOOD LEFT ANTECUBITAL  Final   Special Requests   Final    BOTTLES  DRAWN AEROBIC AND ANAEROBIC Blood Culture results may not be optimal due to an excessive volume of blood received in culture bottles   Culture   Final    NO GROWTH 2 DAYS Performed at [facility], [place] [address] [place], [place] [address]    Report Status PENDING  Incomplete  Culture, blood (Routine X 2) w Reflex to ID Panel     Status: None (Preliminary result)   Collection Time: 08/25/17  8:29 AM  Result Value Ref Range Status   Specimen Description BLOOD RIGHT FOREARM  Final   Special Requests   Final    BOTTLES DRAWN AEROBIC AND ANAEROBIC Blood Culture results may not be optimal due to an excessive volume of blood received in culture bottles   Culture   Final    NO GROWTH 2 DAYS Performed at [facility], [place] [address], [place], [place] [address]    Report Status PENDING  Incomplete         Radiology Studies: No results found.      Scheduled Meds: . amLODipine  10 mg Oral Daily  . apixaban  5 mg Oral BID  . atorvastatin  80 mg Oral Daily  . furosemide  60 mg Intravenous BID  . insulin aspart  0-9 Units Subcutaneous TID WC  . insulin NPH Human  22 Units Subcutaneous BID AC & HS  . losartan  50 mg Oral Daily  . metoprolol tartrate  25 mg Oral BID  . mupirocin ointment   Topical Daily  . potassium chloride  40 mEq Oral BID  . sodium chloride flush  3 mL Intravenous Q12H   Continuous Infusions: . sodium chloride       LOS: 2 days    Time spent: 25 minutes    Edwin Dada, MD [facility] 08/27/2017, 0900 AM     Pager [phone] --- please page though AMION:  [email or web address] Password [email or web address] If 7PM-7AM, please contact night-coverage

## 2017-08-27 NOTE — Consult Note (Addendum)
Cardiology Consult    Patient ID: Kristina Summers; [id number]; [birth year]   Admit date: 08/25/2017 Date of Consult: 08/27/2017  Primary Care Provider: Rogers Blocker, MD Primary Cardiologist: Dr. Tollie Eth  Patient Profile    Kristina Summers is a 82 y.o. female with past medical history of chronic diastolic CHF, CAD (s/p CABG in 2000), chronic atrial fibrillation (on Eliquis for anticoagulation), aortic stenosis, PVD, HTN, HLD, IDDM and prior CVA who is being seen today for the evaluation of CHF at the request of Dr. Loleta Books.   History of Present Illness    Kristina Summers was last admitted with an acute CHF exacerbation in 01/2017 and an echocardiogram at that time showed a preserved EF of 50-55% with moderate AS and mild to moderate AI with a mean gradient of 16 mm Hg and peak gradient 35 mm Hg. She diuresed well with IV Lasix and was placed on PO Lasix 40mg  daily at discharge. Weight was 207 lbs at that time.   She presented to [name] ED on 08/25/2017 for evaluation of worsening dyspnea. Reports having worsening lower extremity edema for the past several weeks but noticed acute worsening of her respiratory status over the past 48 hours leading to admission. Experienced dyspnea on exertion and orthopnea. Denies any associated chest pain, palpitations, lightheadedness, dizziness, or presyncope. Unsure of any weight changes on her home scales.   Initial labs show WBC 9.5, Hgb 11.0, platelets 325, Na+ 137, K+ 3.7, and creatinine 1.07. BNP elevated to 432. D-dimer 1.18. Initial troponin 0.05 with repeat values of 0.05 and 0.06. EKG shows atrial fibrillation, HR 105, with PVC's and nonspecific IVCD. CXR showing cardiomegaly with interstitial edema and probable small pleural effusions. CTA negative for PE but did show cardiomegaly with bilateral layering pleural effusions and pulmonary edema and scattered patchy nodular airspace opacities are present bilaterally which may represent relatively rounded areas of  focal alveolar edema, a superimposed multifocal infectious/inflammatory process, or underlying pulmonary nodules which are partially obscured by the edema.   She was started on IV Lasix 40mg  BID and is overall -700 mL since admission but multiple urine occurrences have not been documented. Weight has declined by 3 lbs (212 --> 209 lbs) and creatinine has increased slightly to 1.21 (baseline 1.1 - 1.2, peaking at 1.7 in 05/2017). Was also started on antibiotic therapy initially for concerns of cellulitis but these have since been discontinued as her symptoms were thought to be secondary to chronic venous stasis.     Past Medical History:  Diagnosis Date  . Angina   . Arthritis   . Atrial fibrillation ([place])   . CAD (coronary artery disease)    a. s/p CABG in 2000  . Carotid artery occlusion   . Diabetes mellitus   . Diastolic CHF ([place]) 29/7989  . Fibromyalgia   . GERD (gastroesophageal reflux disease)   . GI bleed 1/11  . Hyperlipidemia   . Hypertension   . Myocardial infarction ([place]) ~ 1991  . Normal nuclear stress test 03/18/2011  . Obesity   . Peripheral vascular disease ([place])   . Stroke [facility])    May 24, 2014  . UTI (lower urinary tract infection) Sept. 2015    Past Surgical History:  Procedure Laterality Date  . ABDOMINAL AORTAGRAM N/A 04/01/2011   Procedure: ABDOMINAL AORTAGRAM;  Surgeon: Conrad [place], MD;  Location: [facility] CATH LAB;  Service: Cardiovascular;  Laterality: N/A;  . ANGIOPLASTY  06/17/11   Left leg common femoral artery cannulation  under u/s Left leg runoff  . CATARACT EXTRACTION W/ INTRAOCULAR LENS  IMPLANT, BILATERAL  2004-2005  . CORONARY ARTERY BYPASS GRAFT  2000   CABG X5  . EYE SURGERY    . LOWER EXTREMITY ANGIOGRAM Bilateral 04/01/2011   Procedure: LOWER EXTREMITY ANGIOGRAM;  Surgeon: Conrad [place], MD;  Location: [facility] CATH LAB;  Service: Cardiovascular;  Laterality: Bilateral;  . LOWER EXTREMITY ANGIOGRAM Left 06/17/2011   Procedure: LOWER EXTREMITY  ANGIOGRAM;  Surgeon: Conrad H[place]yt[place], MD;  Location: [facility] CATH LAB;  Service: Cardiovascular;  Laterality: Left;  . LOWER EXTREMITY ANGIOGRAM N/A 11/18/2011   Procedure: LOWER EXTREMITY ANGIOGRAM;  Surgeon: Conrad [place], MD;  Location: [facility] CATH LAB;  Service: Cardiovascular;  Laterality: N/A;  . PERCUTANEOUS STENT INTERVENTION Right 04/01/2011   Procedure: PERCUTANEOUS STENT INTERVENTION;  Surgeon: Conrad Stafford, MD;  Location: [facility] CATH LAB;  Service: Cardiovascular;  Laterality: Right;  rt iliac stent  . PERIPHERAL ARTERIAL STENT GRAFT  04/01/11   right common iliac  . TRIGGER FINGER RELEASE  1996   right thumb     Home Medications:  Prior to Admission medications   Medication Sig Start Date End Date Taking? Authorizing Provider  acetaminophen (TYLENOL) 325 MG tablet Take 650 mg by mouth every 6 (six) hours as needed for headache.   Yes [provider]  amLODipine (NORVASC) 10 MG tablet Take 10 mg by mouth daily. 07/30/17  Yes [provider]  apixaban (ELIQUIS) 5 MG TABS tablet Take 1 tablet (5 mg total) by mouth 2 (two) times daily. 06/03/14  Yes Rai, Ripudeep K, MD  atorvastatin (LIPITOR) 80 MG tablet Take 1 tablet (80 mg total) by mouth daily. Patient taking differently: Take 80 mg by mouth at bedtime.  11/27/12  Yes Baker, Zachary H, PA-C  furosemide (LASIX) 40 MG tablet Take 40 mg by mouth 2 (two) times daily. 06/30/17  Yes [provider]  glucose 4 GM chewable tablet Chew 4 tablets by mouth once.   Yes [provider]  HUMULIN R 100 UNIT/ML injection Inject 5 units subcutaneously BEFORE BREAKFAST AND DINNER 11/26/16  Yes [provider]  insulin NPH Human (HUMULIN N,NOVOLIN N) 100 UNIT/ML injection Inject 0.38 mLs (38 Units total) into the skin 2 (two) times daily. 38 in am and 34 at night Patient taking differently: Inject 26-57 Units into the skin See admin instructions. Use 37 units every morning and use 22 units every evening 06/03/14  Yes Rai,  Ripudeep K, MD  losartan (COZAAR) 50 MG tablet Take 1 tablet (50 mg total) by mouth daily. Patient taking differently: Take 50 mg by mouth 2 (two) times daily.  06/03/14  Yes Rai, Ripudeep K, MD  metoprolol tartrate (LOPRESSOR) 25 MG tablet Take 25 mg by mouth 2 (two) times daily.  04/16/14  Yes [provider]  Multiple Vitamins-Minerals (ONE-A-DAY 50 PLUS PO) Take 1 tablet by mouth daily.   Yes [provider]  mupirocin ointment (BACTROBAN) 2 % Apply daily to right foot ulceration 03/15/17  Yes Stover, Titorya, DPM  NITROSTAT 0.4 MG SL tablet Place 1 tablet under the tongue every 5 (five) minutes x 3 doses as needed for chest pain. Chest pain 03/07/14  Yes [provider]  ondansetron (ZOFRAN ODT) 4 MG disintegrating tablet Take 1 tablet (4 mg total) by mouth every 6 (six) hours as needed. 07/08/17  Yes Ward, Delice Bison, DO  HYDROcodone-acetaminophen (NORCO/VICODIN) 5-325 MG tablet Take 1 tablet by mouth every 6 (six) hours as  needed. Patient not taking: Reported on 08/25/2017 07/08/17   Ward, Delice Bison, DO  mupirocin ointment (BACTROBAN) 2 % Apply to affected area daily. Patient not taking: Reported on 07/06/2017 05/25/17   Landis Martins, DPM    Inpatient Medications: Scheduled Meds: . amLODipine  10 mg Oral Daily  . apixaban  5 mg Oral BID  . atorvastatin  80 mg Oral Daily  . furosemide  60 mg Intravenous BID  . insulin aspart  0-9 Units Subcutaneous TID WC  . insulin NPH Human  22 Units Subcutaneous BID AC & HS  . losartan  50 mg Oral Daily  . mupirocin ointment   Topical Daily  . potassium chloride  40 mEq Oral BID  . sodium chloride flush  3 mL Intravenous Q12H   Continuous Infusions: . sodium chloride     PRN Meds: sodium chloride, acetaminophen, ondansetron (ZOFRAN) IV, sodium chloride flush  Allergies:    Allergies  Allergen Reactions  . Morphine And Related Nausea And Vomiting    Chest pain    . Lisinopril Cough  . Sertraline Hcl Nausea Only  .  Zoloft [Sertraline Hcl] Nausea Only         Social History:   Social History   Socioeconomic History  . Marital status: Divorced    Spouse name: Not on file  . Number of children: 3  . Years of education: 19  . Highest education level: Not on file  Occupational History  . Occupation: Retired  Scientific laboratory technician  . Financial resource strain: Not on file  . Food insecurity:    Worry: Not on file    Inability: Not on file  . Transportation needs:    Medical: Not on file    Non-medical: Not on file  Tobacco Use  . Smoking status: Former Smoker    Packs/day: 2.00    Years: 30.00    Pack years: 60.00    Types: Cigarettes    Last attempt to quit: 04/19/1990    Years since quitting: 27.3  . Smokeless tobacco: Never Used  . Tobacco comment: stopped smoking cigarettes 1991  Substance and Sexual Activity  . Alcohol use: No    Alcohol/week: 0.0 oz  . Drug use: No  . Sexual activity: Never  Lifestyle  . Physical activity:    Days per week: Not on file    Minutes per session: Not on file  . Stress: Not on file  Relationships  . Social connections:    Talks on phone: Not on file    Gets together: Not on file    Attends religious service: Not on file    Active member of club or organization: Not on file    Attends meetings of clubs or organizations: Not on file    Relationship status: Not on file  . Intimate partner violence:    Fear of current or ex partner: Not on file    Emotionally abused: Not on file    Physically abused: Not on file    Forced sexual activity: Not on file  Other Topics Concern  . Not on file  Social History Narrative   Divorced.  Native of [place].  Formerly worked as Education administrator person. 01/13/17 lives alone   Caffeine use: drinks decaf tea and coffee     Family History:    Family History  Problem Relation Age of Onset  . Other Brother        intestinal blockage  . Diabetes Brother  Review of Systems    General:  No chills, fever, night  sweats or weight changes.  Cardiovascular:  No chest pain, palpitations, paroxysmal nocturnal dyspnea. Positive for dyspnea on exertion, edema, and orthopnea.  Dermatological: No rash, lesions/masses Respiratory: No cough, dyspnea Urologic: No hematuria, dysuria Abdominal:   No nausea, vomiting, diarrhea, bright red blood per rectum, melena, or hematemesis Neurologic:  No visual changes, wkns, changes in mental status. All other systems reviewed and are otherwise negative except as noted above.  Physical Exam/Data    Vitals:   08/26/17 1214 08/26/17 2109 08/27/17 0527 08/27/17 1119  BP: 118/67 (!) 148/57 131/63 (!) 153/70  Pulse: 94 73 78 (!) 30  Resp:  18 17 18   Temp: 98.1 F (36.7 C) 98.1 F (36.7 C) 98.2 F (36.8 C) 97.6 F (36.4 C)  TempSrc: Oral Oral Oral Oral  SpO2: 94% 92% 92% 94%  Weight:   209 lb 3.2 oz (94.9 kg)   Height:        Intake/Output Summary (Last 24 hours) at 08/27/2017 1154 Last data filed at 08/27/2017 0900 Gross per 24 hour  Intake 480 ml  Output 900 ml  Net -420 ml   Filed Weights   08/25/17 1443 08/26/17 0604 08/27/17 0527  Weight: 210 lb 14.4 oz (95.7 kg) 208 lb 14.4 oz (94.8 kg) 209 lb 3.2 oz (94.9 kg)   Body mass index is 33.77 kg/m.   General: Pleasant, Caucasian female appearing NAD Psych: Normal affect. Neuro: Alert and oriented X 3. Moves all extremities spontaneously. HEENT: Normal  Neck: Supple without bruits or JVD. Lungs:  Resp regular and unlabored, rales along left base. Heart: Irregularly irregular, no s3, s4, 3/6 SEM along RUSB.  Abdomen: Soft, non-tender, non-distended, BS + x 4.  Extremities: No clubbing or cyanosis. 1+ pitting edema along RLE, trace edema long LLE. DP/PT/Radials 2+ and equal bilaterally.   EKG:  The EKG was personally reviewed and demonstrates:  Atrial fibrillation, HR 105, with PVC's and nonspecific IVCD.   Labs/Studies     Relevant CV Studies:  Echocardiogram: 08/26/2017 Study Conclusions  - Left  ventricle: The cavity size was normal. Wall thickness was   increased in a pattern of mild LVH. Systolic function was normal.   The estimated ejection fraction was in the range of 50% to 55%.   Suspected diastolic dysfunction and elevated LV filling pressure   (a-fib is noted). - Aortic valve: Calcified with restricted leaflet motion. There is   likely severe stenosis. Trivial regurgitation. Mean gradient (S):   21 mm Hg. Peak gradient (S): 34 mm Hg. Valve area (VTI): 0.9   cm^2. Valve area (Vmax): 0.81 cm^2. Valve area (Vmean): 0.83   cm^2. - Mitral valve: Calcified annulus. Mildly thickened leaflets . - Left atrium: Severely dilated. - Right ventricle: The cavity size was mildly dilated. Moderately   reduced systolic function. - Right atrium: The atrium was mildly dilated. - Inferior vena cava: The vessel was dilated. The respirophasic   diameter changes were blunted (< 50%), consistent with elevated   central venous pressure.  Impressions:  - Compared to a prior study in 01/2017, the LVEF is unchanged.   There is now likely severe aortic stenosis with an AVA around 0.9   cm2, there is severe LAE and diastolic dysfunction with elevated   LV filling pressure.   Laboratory Data:  Chemistry Recent Labs  Lab 08/25/17 0410 08/26/17 0259 08/27/17 0519  NA 137 138 138  K 3.7 3.5 3.0*  CL  103 103 100*  CO2 26 27 30   GLUCOSE 237* 180* 150*  BUN 16 14 20   CREATININE 1.07* 1.09* 1.21*  CALCIUM 8.8* 8.6* 8.6*  GFRNONAA 48* 47* 41*  GFRAA 56* 54* 48*  ANIONGAP 8 8 8     Recent Labs  Lab 08/25/17 0410  PROT 7.2  ALBUMIN 2.5*  AST 43*  ALT 27  ALKPHOS 135*  BILITOT 1.3*   Hematology Recent Labs  Lab 08/25/17 0410  WBC 9.5  RBC 3.41*  HGB 11.0*  HCT 32.2*  MCV 94.4  MCH 32.3  MCHC 34.2  RDW 14.5  PLT 325   Cardiac Enzymes Recent Labs  Lab 08/25/17 1541 08/25/17 2143 08/26/17 0259  TROPONINI 0.05* 0.05* 0.06*    Recent Labs  Lab 08/25/17 0418    TROPIPOC 0.04    BNP Recent Labs  Lab 08/25/17 0323  BNP 432.3*    DDimer  Recent Labs  Lab 08/25/17 0410  DDIMER 1.18*    Radiology/Studies:  Dg Chest 2 View  Result Date: 08/25/2017 CLINICAL DATA:  82 year old female with shortness of breath. EXAM: CHEST - 2 VIEW COMPARISON:  Chest radiograph dated 07/08/2017 FINDINGS: There is mild cardiomegaly. There is diffuse interstitial prominence consistent with interstitial edema. Pneumonia is not excluded. Clinical correlation is recommended. Small bilateral pleural effusions may be present. There is no focal consolidation or pneumothorax. Median sternotomy wires noted. No acute osseous pathology. IMPRESSION: Cardiomegaly with interstitial edema and probable small pleural effusions. No focal consolidation. Electronically Signed   By: Anner Crete M.D.   On: 08/25/2017 04:39   Ct Angio Chest Pe W Or Wo Contrast  Result Date: 08/25/2017 CLINICAL DATA:  82 year old female with shortness of breath, positive D-dimer and intermediate probability for acute pulmonary embolus EXAM: CT ANGIOGRAPHY CHEST WITH CONTRAST TECHNIQUE: Multidetector CT imaging of the chest was performed using the standard protocol during bolus administration of intravenous contrast. Multiplanar CT image reconstructions and MIPs were obtained to evaluate the vascular anatomy. CONTRAST:  190mL ISOVUE-370 IOPAMIDOL (ISOVUE-370) INJECTION 76% COMPARISON:  Chest x-ray 08/25/2017 FINDINGS: Cardiovascular: Adequate opacification of the pulmonary arteries to the proximal subsegmental level. No evidence of focal filling defect to suggest acute pulmonary embolus. The pulmonary artery is normal in size. Three vessel aortic arch. No evidence of aortic aneurysm. Extensive atherosclerotic calcifications present throughout the thoracic aorta and the origins of the branch vessels. Patient is status post prior median sternotomy with multivessel CABG. Cardiomegaly with biatrial enlargement. No  pericardial effusion. Mediastinum/Nodes: Unremarkable CT appearance of the thyroid gland. No suspicious mediastinal or hilar adenopathy. No soft tissue mediastinal mass. The thoracic esophagus is unremarkable. Lungs/Pleura: Small right and trace left layering pleural effusions. Biapical pleuroparenchymal scarring is present. Mild interlobular septal thickening with scattered areas of ground-glass attenuation airspace opacity. There is respiratory motion which limits evaluation for pulmonary nodules. Several ground-glass attenuation nodular opacities are present bilaterally which may represent rounded areas of alveolar edema, or a multifocal infectious/inflammatory process. Mild subpleural reticulation in the periphery of the bases consistent with fibrotic change. Bilateral lower lobe atelectasis secondary to the presence of the pleural effusions. Upper Abdomen: Abnormal hepatic morphology. The contour appears nodular and there is relative hypertrophy of the caudate lobe with atrophy of the right hepatic lobe. Findings suggest underlying cirrhosis. No acute abnormality in the visualized upper abdomen. Musculoskeletal: No acute fracture or aggressive appearing lytic or blastic osseous lesion. Bilateral acromioclavicular joint osteoarthritis. Review of the MIP images confirms the above findings. IMPRESSION: 1. Negative for acute pulmonary  embolus. 2. The constellation of findings (cardiomegaly with bilateral layering pleural effusions and pulmonary edema) is most consistent with congestive heart failure. 3. Scattered patchy nodular airspace opacities are present bilaterally. These may represent relatively rounded areas of focal alveolar edema, a superimposed multifocal infectious/inflammatory process, or underlying pulmonary nodules which are partially obscured by the edema. Recommend repeat CT scan of the chest without intravenous contrast when the patient has recovered from the this episode of acute CHF. 4. Pulmonary  parenchymal changes suggest mild pulmonary fibrosis in the periphery of the lower lungs. 5. Suspect hepatic cirrhosis. 6.  Aortic Atherosclerosis (ICD10-170.0) Electronically Signed   By: Jacqulynn Cadet M.D.   On: 08/25/2017 08:20   Dg Knee Complete 4 Views Left  Result Date: 08/25/2017 CLINICAL DATA:  [age]-year-old female with left knee pain and swelling. EXAM: LEFT KNEE - COMPLETE 4+ VIEW COMPARISON:  None. FINDINGS: There is no acute fracture or dislocation. Mild osteopenia. There is mild arthritic changes with narrowing of the medial compartment and meniscal chondrocalcinosis of the lateral compartment. No joint effusion. There is diffuse subcutaneous edema. IMPRESSION: 1. No acute fracture or dislocation. 2. Mild arthritic changes. 3. Diffuse subcutaneous edema may represent cellulitis. Clinical correlation is recommended. Electronically Signed   By: Anner Crete M.D.   On: 08/25/2017 04:40     Assessment & Plan    1. Acute on Chronic Diastolic CHF - presented with worsening dyspnea on exertion, orthopnea, and edema which had worsened acutely over the past 48 hours leading to admission. Unsure of any weight changes on her home scales but weight was at 207 lbs in 01/2017, at 212 lbs on admission.  - BNP elevated to 432. D-dimer 1.18. CXR showing cardiomegaly with interstitial edema and probable small pleural effusions. CTA negative for PE. - she has been started on IV Lasix 40mg  BID and is overall -700 mL since admission but multiple urine occurrences have not been documented. Weight has declined by 3 lbs (212 --> 209 lbs). IV Lasix dosing has been titrated to 60mg  BID by the admitting team. Repeat BMET in AM as creatinine has started to trend upwards. She was on Lasix 40mg  BID as an outpatient. May require a higher standing dose as she reports compliance with her medication regimen and a low-sodium diet.    2. Aortic Stenosis - moderate by echo in 01/2017 with mean gradient (S): 16 mm  Hg and Peak gradient (S): 35 mm Hg. Repeat this admission shows severe AS with Mean gradient (S):21 mm Hg and Peak gradient (S): 34 mm Hg. - we reviewed her echo report in detail. I recommended she keep close follow-up with her Primary Cardiologist for further discussion as they will need to review surgical options in detail. Likely a good TAVR candidate given her prior CABG.   3. Chronic Atrial Fibrillation - she denies any evidence of active bleeding. Remains on Eliquis for anticoagulation.  - remains on Lopressor 25mg  BID for rate-control.  4. PVD - followed by Dr. Bridgett Larsson with Vascular Surgery. Agree that the discoloration along her lower extremities is likely due to chronic venous stasis.   5. HTN - BP has been stable at 118/57 - 153/70 within the past 24 hours.  - continue current medication regimen.  6. Stage 3 CKD - baseline creatinine 1.1 - 1.2, peaking at 1.7 in 05/2017. - at 1.07 on admission and trending up to 1.21 this AM. Repeat BMET in AM and anticipate switching back to PO diuretics at that time pending  trend.    For questions or updates, please contact [place] Please consult [email or web address] for contact info under Cardiology/STEMI.  Signed, Erma Heritage, PA-C 08/27/2017, 11:54 AM Pager: [phone]   Attending note:  Patient seen and examined.  Reviewed records and discussed the case with Ms. Delano Metz.  Ms. Stannard is a patient of Dr. [name] with history of CAD status post CABG, chronic atrial fibrillation, PAD, CKD stage III, and aortic stenosis that was moderate by echocardiogram in October 2018.  She presents with recent worsening shortness of breath and leg swelling, symptoms noted over the last week.  She reports compliance with her medications including Lasix 40 mg twice daily as an outpatient. She does not report any recent angina or palpitations.   She is admitted for IV diuresis, starting to feel somewhat better.  She has ruled out for ACS, troponin  I levels 0.05 and 0.06.   Chest imaging is shows interstitial edema and bilateral pleural effusions.  Follow-up echocardiogram indicates stable LVEF in the range of 50 to 55% with indeterminate diastolic function in the setting of atrial fibrillation.  Aortic stenosis is closer to the severe range at this time although gradients are relatively low.  Mean gradient is 21 mmHg, but dimensionless index is 0.26 and calculated valve area is around 1 cm.  She appears comfortable on examination, decreased breath sounds at the bases with a few crackles.  Cardiac exam reveals irregularly irregular rhythm with 9-5/7 systolic murmur at the base.  Lab work shows creatinine 1.21, peak troponin I 0.06, hemoglobin 11.0.  BNP 432.  Patient presents with acute on chronic diastolic heart failure complicated by aortic stenosis that is closer to severe range by follow-up echocardiogram.  No clear evidence of ACS.  We will continue with IV diuresis, watch renal function.  May need somewhat higher diuretic dose as an outpatient.  She is otherwise on a reasonable medical regimen with adequate heart rate control of atrial fibrillation.  Would defer to Dr. [name], however consideration could be given for a follow-up left and right heart catheterization as an outpatient to address potential for TAVR evaluation.  Satira Sark, M.D., F.A.C.C.

## 2017-08-27 NOTE — Progress Notes (Signed)
Patient resting comfortably during shift report. Denies complaints.  

## 2017-08-28 LAB — BASIC METABOLIC PANEL
Anion gap: 5 (ref 5–15)
BUN: 24 mg/dL — AB (ref 6–20)
CALCIUM: 8.8 mg/dL — AB (ref 8.9–10.3)
CO2: 30 mmol/L (ref 22–32)
Chloride: 105 mmol/L (ref 101–111)
Creatinine, Ser: 1.29 mg/dL — ABNORMAL HIGH (ref 0.44–1.00)
GFR calc Af Amer: 44 mL/min — ABNORMAL LOW (ref >60)
GFR calc non Af Amer: 38 mL/min — ABNORMAL LOW (ref >60)
GLUCOSE: 96 mg/dL (ref 65–99)
Potassium: 3.8 mmol/L (ref 3.5–5.1)
Sodium: 140 mmol/L (ref 135–145)

## 2017-08-28 LAB — GLUCOSE, CAPILLARY
GLUCOSE-CAPILLARY: 200 mg/dL — AB (ref 65–99)
GLUCOSE-CAPILLARY: 225 mg/dL — AB (ref 65–99)
Glucose-Capillary: 84 mg/dL (ref 65–99)
Glucose-Capillary: 196 mg/dL — ABNORMAL HIGH (ref 65–99)

## 2017-08-28 MED ORDER — INSULIN ASPART 100 UNIT/ML ~~LOC~~ SOLN
0.0000 [IU] | Freq: Three times a day (TID) | SUBCUTANEOUS | Status: DC
  Administered 2017-08-28: 3 [IU] via SUBCUTANEOUS

## 2017-08-28 NOTE — Progress Notes (Signed)
PROGRESS NOTE    Kristina Summers  VOZ:[id number] DOB: 1935-10-12 DOA: 08/25/2017 PCP: Rogers Blocker, MD      Brief Narrative:  Kristina Summers is a 82 y.o. F with chronic diastolic CHF, pAF on Eliquis, PVD, HTN, IDDM, CAD s/p CABG, hx of CVA who presents with dyspnea on exertion and leg swelling, found to have edema on CXR and elevated BNP, started on IV Lasix and admitted for CHF and bilateral lower extremity cellulitis.      Assessment & Plan:  Acute on chronic diastolic CHF Weights and I/Os inaccurate.  Cr stable.  Still edema on exam.  Echo shows EF 50-55% with progression of AS. -Continue IV furosemide, K -Strict I/Os, daily weights, telemetry  -Daily monitoring renal function   Aortic stenosis -Consult Cardiology, appreciate cares  Chronic venous stasis Without fever, chills or leukocytosis nor other signs/symptoms of infection, I will hold antibiotics and monitor the redness in her legs, which appears to have only now improved with diuresis.  No evidence of worsening off antibiotics, no fever, chills.  Hypertension -Continue amlodipine, losartan  Paroxysmal atrial fibrillation CHADS2Vasc 9 -Continue apixaban  History CVA Peripheral vascular disease -Continue apixaban, statin  Diabetes Glucoses fluctuatnig somewhat, AM glucoses good. -Continue NPH -Continue SSI, increase to moderate scale       DVT prophylaxis: N/A on apixaban Code Status: DO NOT RESUSCITATE Family Communication: None presenet MDM and disposition Plan:  Below labs and imaging reports were reviewed and summarized above.  She is clinically improving but still has pitting edema on exam, fluid overload noted.  She was admitted for severe flare of CHF which is improving.  Her echo showed aortic stenosis, for which cardiology was consulted who has made the recommendations for outpatient follow-up.  We will continue IV diuresis likely 1 more day, and reevaluate tomorrow, home either tomorrow or the next  day, without PT or OT needed.     Consultants:   Cardiology  Procedures:   Echocardiogram 5/10 Study Conclusions  - Left ventricle: The cavity size was normal. Wall thickness was   increased in a pattern of mild LVH. Systolic function was normal.   The estimated ejection fraction was in the range of 50% to 55%.   Suspected diastolic dysfunction and elevated LV filling pressure   (a-fib is noted). - Aortic valve: Calcified with restricted leaflet motion. There is   likely severe stenosis. Trivial regurgitation. Mean gradient (S):   21 mm Hg. Peak gradient (S): 34 mm Hg. Valve area (VTI): 0.9   cm^2. Valve area (Vmax): 0.81 cm^2. Valve area (Vmean): 0.83   cm^2. - Mitral valve: Calcified annulus. Mildly thickened leaflets . - Left atrium: Severely dilated. - Right ventricle: The cavity size was mildly dilated. Moderately   reduced systolic function. - Right atrium: The atrium was mildly dilated. - Inferior vena cava: The vessel was dilated. The respirophasic   diameter changes were blunted (< 50%), consistent with elevated   central venous pressure.  Impressions:  - Compared to a prior study in 01/2017, the LVEF is unchanged.   There is now likely severe aortic stenosis with an AVA around 0.9   cm2, there is severe LAE and diastolic dysfunction with elevated   LV filling pressure.   Antimicrobials:   Ceftriaxone x1    Subjective: Good appetite, slept well.  Breathing is fine when she walks around.  No orthopnea.  She still has leg swelling.  No new fever, chills, left leg pain, redness, swelling  Objective: Vitals:  08/28/17 0545 08/28/17 0923 08/28/17 1151 08/28/17 1448  BP: (!) 151/65 (!) 145/58 137/60 139/70  Pulse: 68 76 (!) 53 60  Resp: 18  20 20   Temp: 98 F (36.7 C)  98.2 F (36.8 C) 97.7 F (36.5 C)  TempSrc: Oral  Oral Oral  SpO2: 97% 96% 97% 99%  Weight: 94.8 kg (208 lb 14.4 oz)     Height:        Intake/Output Summary (Last 24 hours) at  08/28/2017 1604 Last data filed at 08/28/2017 1200 Gross per 24 hour  Intake 1083 ml  Output 2200 ml  Net -1117 ml   Filed Weights   08/26/17 0604 08/27/17 0527 08/28/17 0545  Weight: 94.8 kg (208 lb 14.4 oz) 94.9 kg (209 lb 3.2 oz) 94.8 kg (208 lb 14.4 oz)    Examination: General appearance: Elderly female, sitting in the edge of the bed, eating breakfast, no acute distress. HEENT: Sclera anicteric, conjunctival pink, lids and lashes normal.  No nasal deformity, discharge, or epistaxis.  Lips and teeth normal.  Oropharynx moist, no oral lesions. Skin: Is warm and dry, no suspicious rashes or lesions.  She has chronic venous stasis changes of the legs, bilateral wine bottle deformities of the lower legs.  No excess redness or edema or pain to suggest infection.   Cardiac: Regular rate and rhythm, loud systolic murmur, normal JVP.  Persistent pitting edema at the knee. Respiratory: Respiratory effort normal, no rales or wheezes. Abdomen: Abdomen soft.  No TTP. No ascites, distension, hepatosplenomegaly.   MSK: No deformities or effusions. Neuro: Awake and alert, extraocular movements intact, pupils equal round and reactive to light, moves all extremities with equal strength and coordination.  Speech fluent.   Psych: Sensorium intact, oriented to person place and time, affect normal, judgment and insight normal.    Data Reviewed: I have personally reviewed following labs and imaging studies:  CBC: Recent Labs  Lab 08/25/17 0410  WBC 9.5  NEUTROABS 7.0  HGB 11.0*  HCT 32.2*  MCV 94.4  PLT 578   Basic Metabolic Panel: Recent Labs  Lab 08/25/17 0410 08/26/17 0259 08/27/17 0519 08/28/17 0609  NA 137 138 138 140  K 3.7 3.5 3.0* 3.8  CL 103 103 100* 105  CO2 26 27 30 30   GLUCOSE 237* 180* 150* 96  BUN 16 14 20  24*  CREATININE 1.07* 1.09* 1.21* 1.29*  CALCIUM 8.8* 8.6* 8.6* 8.8*   GFR: Estimated Creatinine Clearance: 41 mL/min (A) (by C-G formula based on SCr of 1.29 mg/dL  (H)). Liver Function Tests: Recent Labs  Lab 08/25/17 0410  AST 43*  ALT 27  ALKPHOS 135*  BILITOT 1.3*  PROT 7.2  ALBUMIN 2.5*   No results for input(s): LIPASE, AMYLASE in the last 168 hours. No results for input(s): AMMONIA in the last 168 hours. Coagulation Profile: No results for input(s): INR, PROTIME in the last 168 hours. Cardiac Enzymes: Recent Labs  Lab 08/25/17 1541 08/25/17 2143 08/26/17 0259  TROPONINI 0.05* 0.05* 0.06*   BNP (last 3 results) No results for input(s): PROBNP in the last 8760 hours. HbA1C: No results for input(s): HGBA1C in the last 72 hours. CBG: Recent Labs  Lab 08/27/17 1250 08/27/17 1632 08/27/17 2155 08/28/17 0716 08/28/17 1149  GLUCAP 168* 216* 306* 84 196*   Lipid Profile: No results for input(s): CHOL, HDL, LDLCALC, TRIG, CHOLHDL, LDLDIRECT in the last 72 hours. Thyroid Function Tests: No results for input(s): TSH, T4TOTAL, FREET4, T3FREE, THYROIDAB in the last 72  hours. Anemia Panel: No results for input(s): VITAMINB12, FOLATE, FERRITIN, TIBC, IRON, RETICCTPCT in the last 72 hours. Urine analysis:    Component Value Date/Time   COLORURINE YELLOW 07/08/2017 1101   APPEARANCEUR CLOUDY (A) 07/08/2017 1101   LABSPEC 1.010 07/08/2017 1101   PHURINE 7.0 07/08/2017 1101   GLUCOSEU NEGATIVE 07/08/2017 1101   HGBUR MODERATE (A) 07/08/2017 1101   BILIRUBINUR NEGATIVE 07/08/2017 1101   KETONESUR NEGATIVE 07/08/2017 1101   PROTEINUR >=300 (A) 07/08/2017 1101   UROBILINOGEN 1.0 05/29/2014 1052   NITRITE POSITIVE (A) 07/08/2017 1101   LEUKOCYTESUR LARGE (A) 07/08/2017 1101   Sepsis Labs: @LABRCNTIP (procalcitonin:4,lacticacidven:4)  ) Recent Results (from the past 240 hour(s))  Culture, blood (Routine X 2) w Reflex to ID Panel     Status: None (Preliminary result)   Collection Time: 08/25/17  8:28 AM  Result Value Ref Range Status   Specimen Description BLOOD LEFT ANTECUBITAL  Final   Special Requests   Final    BOTTLES DRAWN  AEROBIC AND ANAEROBIC Blood Culture results may not be optimal due to an excessive volume of blood received in culture bottles   Culture   Final    NO GROWTH 3 DAYS Performed at [facility], [place] [address], [place], [place] [address]    Report Status PENDING  Incomplete  Culture, blood (Routine X 2) w Reflex to ID Panel     Status: None (Preliminary result)   Collection Time: 08/25/17  8:29 AM  Result Value Ref Range Status   Specimen Description BLOOD RIGHT FOREARM  Final   Special Requests   Final    BOTTLES DRAWN AEROBIC AND ANAEROBIC Blood Culture results may not be optimal due to an excessive volume of blood received in culture bottles   Culture   Final    NO GROWTH 3 DAYS Performed at [facility], [place] [address], [place],  [address]    Report Status PENDING  Incomplete         Radiology Studies: No results found.      Scheduled Meds: . amLODipine  10 mg Oral Daily  . apixaban  5 mg Oral BID  . atorvastatin  80 mg Oral Daily  . furosemide  60 mg Intravenous BID  . insulin aspart  0-9 Units Subcutaneous TID WC  . insulin NPH Human  22 Units Subcutaneous BID AC & HS  . losartan  50 mg Oral Daily  . metoprolol tartrate  25 mg Oral BID  . mupirocin ointment   Topical Daily  . potassium chloride  40 mEq Oral BID  . sodium chloride flush  3 mL Intravenous Q12H   Continuous Infusions: . sodium chloride       LOS: 3 days    Time spent: 25 minutes    Edwin Dada, MD [facility] 08/28/2017, 8:45 AM     Pager [phone] --- please page though AMION:  [email or web address] If 7PM-7AM, please contact night-coverage

## 2017-08-28 NOTE — Progress Notes (Signed)
Progress Note  Patient Name: Kristina Summers Date of Encounter: 08/28/2017  Primary Cardiologist: Dr. Tollie Eth  Subjective   Experienced orthopnea early a.m. but better with oxygen.  She feels better this morning.  No chest pain or palpitations.  Inpatient Medications    Scheduled Meds: . amLODipine  10 mg Oral Daily  . apixaban  5 mg Oral BID  . atorvastatin  80 mg Oral Daily  . furosemide  60 mg Intravenous BID  . insulin aspart  0-9 Units Subcutaneous TID WC  . insulin NPH Human  22 Units Subcutaneous BID AC & HS  . losartan  50 mg Oral Daily  . metoprolol tartrate  25 mg Oral BID  . mupirocin ointment   Topical Daily  . potassium chloride  40 mEq Oral BID  . sodium chloride flush  3 mL Intravenous Q12H   Continuous Infusions: . sodium chloride     PRN Meds: sodium chloride, acetaminophen, ondansetron (ZOFRAN) IV, sodium chloride flush   Vital Signs    Vitals:   08/27/17 1600 08/27/17 2034 08/28/17 0545 08/28/17 0923  BP: (!) 130/57 (!) 126/95 (!) 151/65 (!) 145/58  Pulse: 69 73 68 76  Resp:   18   Temp:  98.1 F (36.7 C) 98 F (36.7 C)   TempSrc:  Oral Oral   SpO2:   97% 96%  Weight:   208 lb 14.4 oz (94.8 kg)   Height:        Intake/Output Summary (Last 24 hours) at 08/28/2017 0950 Last data filed at 08/28/2017 7026 Gross per 24 hour  Intake 1203 ml  Output 2300 ml  Net -1097 ml   Filed Weights   08/26/17 0604 08/27/17 0527 08/28/17 0545  Weight: 208 lb 14.4 oz (94.8 kg) 209 lb 3.2 oz (94.9 kg) 208 lb 14.4 oz (94.8 kg)    Telemetry    Atrial fibrillation.  Personally reviewed.  Physical Exam   GEN: No acute distress.   Neck: No JVD. Cardiac:  Irregularly irregular, 3/6 systolic murmur, no gallop.  Respiratory: Nonlabored.  Scattered rhonchi at bases. GI: Soft, nontender, bowel sounds present. MS:  1+ leg edema; No deformity. Neuro:  Nonfocal. Psych: Alert and oriented x 3. Normal affect.  Labs    Chemistry Recent Labs  Lab  08/25/17 0410 08/26/17 0259 08/27/17 0519 08/28/17 0609  NA 137 138 138 140  K 3.7 3.5 3.0* 3.8  CL 103 103 100* 105  CO2 26 27 30 30   GLUCOSE 237* 180* 150* 96  BUN 16 14 20  24*  CREATININE 1.07* 1.09* 1.21* 1.29*  CALCIUM 8.8* 8.6* 8.6* 8.8*  PROT 7.2  --   --   --   ALBUMIN 2.5*  --   --   --   AST 43*  --   --   --   ALT 27  --   --   --   ALKPHOS 135*  --   --   --   BILITOT 1.3*  --   --   --   GFRNONAA 48* 47* 41* 38*  GFRAA 56* 54* 48* 44*  ANIONGAP 8 8 8 5      Hematology Recent Labs  Lab 08/25/17 0410  WBC 9.5  RBC 3.41*  HGB 11.0*  HCT 32.2*  MCV 94.4  MCH 32.3  MCHC 34.2  RDW 14.5  PLT 325    Cardiac Enzymes Recent Labs  Lab 08/25/17 1541 08/25/17 2143 08/26/17 0259  TROPONINI 0.05* 0.05* 0.06*  Recent Labs  Lab 08/25/17 0418  TROPIPOC 0.04     BNP Recent Labs  Lab 08/25/17 0323  BNP 432.3*     DDimer  Recent Labs  Lab 08/25/17 0410  DDIMER 1.18*     Radiology    Chest CTA 08/25/2017: IMPRESSION: 1. Negative for acute pulmonary embolus. 2. The constellation of findings (cardiomegaly with bilateral layering pleural effusions and pulmonary edema) is most consistent with congestive heart failure. 3. Scattered patchy nodular airspace opacities are present bilaterally. These may represent relatively rounded areas of focal alveolar edema, a superimposed multifocal infectious/inflammatory process, or underlying pulmonary nodules which are partially obscured by the edema. Recommend repeat CT scan of the chest without intravenous contrast when the patient has recovered from the this episode of acute CHF. 4. Pulmonary parenchymal changes suggest mild pulmonary fibrosis in the periphery of the lower lungs. 5. Suspect hepatic cirrhosis. 6.  Aortic Atherosclerosis (ICD10-170.0)  Cardiac Studies   Echocardiogram 08/26/2017: Study Conclusions  - Left ventricle: The cavity size was normal. Wall thickness was   increased in a  pattern of mild LVH. Systolic function was normal.   The estimated ejection fraction was in the range of 50% to 55%.   Suspected diastolic dysfunction and elevated LV filling pressure   (a-fib is noted). - Aortic valve: Calcified with restricted leaflet motion. There is   likely severe stenosis. Trivial regurgitation. Mean gradient (S):   21 mm Hg. Peak gradient (S): 34 mm Hg. Valve area (VTI): 0.9   cm^2. Valve area (Vmax): 0.81 cm^2. Valve area (Vmean): 0.83   cm^2. - Mitral valve: Calcified annulus. Mildly thickened leaflets . - Left atrium: Severely dilated. - Right ventricle: The cavity size was mildly dilated. Moderately   reduced systolic function. - Right atrium: The atrium was mildly dilated. - Inferior vena cava: The vessel was dilated. The respirophasic   diameter changes were blunted (< 50%), consistent with elevated   central venous pressure.  Impressions:  - Compared to a prior study in 01/2017, the LVEF is unchanged.   There is now likely severe aortic stenosis with an AVA around 0.9   cm2, there is severe LAE and diastolic dysfunction with elevated   LV filling pressure.  Patient Profile     82 y.o. female past medical history of chronic diastolic CHF, CAD (s/p CABG in 2000), chronic atrial fibrillation (on Eliquis for anticoagulation), aortic stenosis, PVD, HTN, HLD, IDDM and prior CVA.  She presents now with acute on chronic diastolic heart failure complicated by progressive aortic stenosis.  Assessment & Plan    1.  Acute on chronic diastolic heart failure.  Follow-up echocardiogram reveals LVEF 50 to 55%, technically indeterminate diastolic function in the setting of atrial fibrillation.  Weight is coming down on IV diuresis, output was not accurately recorded last 24 hours but prior to that she had 1000 cc net out more than in.  2.  Aortic stenosis, high end of moderate to severe range although gradients are relatively low, dimensionless index is 0.26 with  calculated valve area around 1 cm.  3.  CKD stage 3, creatinine 1.29.  4.  Chronic atrial fibrillation.  On Eliquis for stroke prophylaxis.  Heart rate control is adequate on Lopressor.  Would continue IV diuresis for now.  Remaining regimen includes Norvasc, Lopressor, Eliquis, Lipitor, Cozaar, and potassium supplements.  Try and optimize volume status.  Would defer decision to Dr. [name] regarding whether patient would be considered for a left and right heart catheterization and  assessment for TAVR, potentially as an outpatient.  Signed, Rozann Lesches, MD  08/28/2017, 9:50 AM

## 2017-08-29 DIAGNOSIS — Z951 Presence of aortocoronary bypass graft: Secondary | ICD-10-CM

## 2017-08-29 LAB — BASIC METABOLIC PANEL
Anion gap: 5 (ref 5–15)
BUN: 20 mg/dL (ref 6–20)
CHLORIDE: 108 mmol/L (ref 101–111)
CO2: 29 mmol/L (ref 22–32)
CREATININE: 1.19 mg/dL — AB (ref 0.44–1.00)
Calcium: 9.1 mg/dL (ref 8.9–10.3)
GFR calc Af Amer: 49 mL/min — ABNORMAL LOW (ref >60)
GFR calc non Af Amer: 42 mL/min — ABNORMAL LOW (ref >60)
Glucose, Bld: 64 mg/dL — ABNORMAL LOW (ref 65–99)
Potassium: 4.2 mmol/L (ref 3.5–5.1)
Sodium: 142 mmol/L (ref 135–145)

## 2017-08-29 LAB — GLUCOSE, CAPILLARY: Glucose-Capillary: 91 mg/dL (ref 65–99)

## 2017-08-29 NOTE — Discharge Summary (Signed)
Physician Discharge Summary  Kristina Summers WNU:[id number] DOB: 16-Oct-1935 DOA: 08/25/2017  PCP: Rogers Blocker, MD  Admit date: 08/25/2017 Discharge date: 08/29/2017  Admitted From: Home  Disposition:  Home   Recommendations for Outpatient Follow-up:  1. Follow up with PCP in 1-2 weeks 2. Please obtain BMP in 1 week to re-eval BMP 3. Follow up with Cardiology in 2 weeks for discussion re: aortic stenosis progression  Home Health: None  Equipment/Devices: None  Discharge Condition: Good  CODE STATUS: FULL Diet recommendation: Cardiac  Brief/Interim Summary: Mrs. Caulder is a 82 y.o. F with chronic diastolic CHF, pAF on Eliquis, PVD, HTN, IDDM, CAD s/p CABG, hx of CVA who presents with dyspnea on exertion and leg swelling, found to have edema on CXR and elevated BNP, started on IV Lasix and admitted for CHF and bilateral lower extremity cellulitis.       Acute on chronic diastolic CHF Echo showed EF 50-55%, no change, diastolic dysfunction, known.  Also progression of AS.  Treated with IV Lasix, leg swelling resolved, breathing normal with ambulation and no more orthopnea.   Creatinine remained stable.   Aortic stenosis Patient has had progression of her aortic stenosis. Cardiology were consulted who recommended follow up with her primary cardiologist regarding AS: "would defer decision to Dr. [name] regarding whether patient would be considered for a left and right heart catheterization and assessment for TAVR"  Chronic venous stasis Without fever, chills or leukocytosis nor other signs/symptoms of infection.  Antibiotics were held, and her leg pain, swelling and redness resolved with diuresis.  No evidence of worsening off antibiotics, no fever, chills.  Hypertension No change to home regimen  Paroxysmal atrial fibrillation History CVA Peripheral vascular disease CHADS2Vasc 9.  Continue apixaban.  Diabetes Adequate control in hospital.     Discharge Diagnoses:   Principal Problem:   Acute respiratory failure with hypoxia (HCC) Active Problems:   Paroxysmal atrial fibrillation   Diabetes mellitus type 2 with peripheral artery disease (HCC)   Hyperlipidemia   S/P CABG (coronary artery bypass graft)   Peripheral vascular disease, unspecified (HCC)   Stroke (HCC)   Acute exacerbation of Diastolic CHF (congestive heart failure) /EF 60 %   Type II diabetes mellitus with renal manifestations (HCC)   Anemia   CHF (congestive heart failure) (HCC)    Discharge Instructions  Discharge Instructions    Diet - low sodium heart healthy   Complete by:  As directed    Discharge instructions   Complete by:  As directed    From Dr. Loleta Books: You were admitted for leg swelling from your congestive heart failure.   In the hospital, you were given diuretics (high dose Lasix in the IV) and your antibiotics were stopped and the redness and pain in your left leg got better.  There was no sign in your lab work that you had infection, but with diuresis we were able to remove over 10 lbs of fluid.   For your heart failure: Take Lasix 40 mg twice every day. Take in the morning around 7a or 8a, and then in the mid afternoon at 3p or 4p Obtain a home scale, and measure your weight every morning before breakfast IF YOU HAVE more than 3 lbs weight gain in a day, or your weight is ever over 212 lbs, you should call Dr. Thurman Coyer office immediately, because you are developing heart failure again.   For your heart valve: Your aortic valve has become more thickened with age.  This  is called "aortic stenosis".  This may become a problem in the future.   Please call Dr. [name] today to schedule a follow up appointment in the next 2 weeks to discuss this valve issue "aortic stenosis" Call your primary doctor for a follow up appointment in the next week, obtain blood work at that visit.   Increase activity slowly   Complete by:  As directed      Allergies as of 08/29/2017       Reactions   Morphine And Related Nausea And Vomiting   Chest pain    Lisinopril Cough   Sertraline Hcl Nausea Only   Zoloft [sertraline Hcl] Nausea Only         Medication List    TAKE these medications   acetaminophen 325 MG tablet Commonly known as:  TYLENOL Take 650 mg by mouth every 6 (six) hours as needed for headache.   amLODipine 10 MG tablet Commonly known as:  NORVASC Take 10 mg by mouth daily.   apixaban 5 MG Tabs tablet Commonly known as:  ELIQUIS Take 1 tablet (5 mg total) by mouth 2 (two) times daily.   atorvastatin 80 MG tablet Commonly known as:  LIPITOR Take 1 tablet (80 mg total) by mouth daily. What changed:  when to take this   furosemide 40 MG tablet Commonly known as:  LASIX Take 40 mg by mouth 2 (two) times daily.   glucose 4 GM chewable tablet Chew 4 tablets by mouth once.   HUMULIN R 100 units/mL injection Generic drug:  insulin regular Inject 5 units subcutaneously BEFORE BREAKFAST AND DINNER   HYDROcodone-acetaminophen 5-325 MG tablet Commonly known as:  NORCO/VICODIN Take 1 tablet by mouth every 6 (six) hours as needed.   insulin NPH Human 100 UNIT/ML injection Commonly known as:  HUMULIN N,NOVOLIN N Inject 0.38 mLs (38 Units total) into the skin 2 (two) times daily. 38 in am and 34 at night What changed:    how much to take  when to take this  additional instructions   losartan 50 MG tablet Commonly known as:  COZAAR Take 1 tablet (50 mg total) by mouth daily. What changed:  when to take this   metoprolol tartrate 25 MG tablet Commonly known as:  LOPRESSOR Take 25 mg by mouth 2 (two) times daily.   mupirocin ointment 2 % Commonly known as:  BACTROBAN Apply daily to right foot ulceration   mupirocin ointment 2 % Commonly known as:  BACTROBAN Apply to affected area daily.   NITROSTAT 0.4 MG SL tablet Generic drug:  nitroGLYCERIN Place 1 tablet under the tongue every 5 (five) minutes x 3 doses as needed for chest  pain. Chest pain   ondansetron 4 MG disintegrating tablet Commonly known as:  ZOFRAN ODT Take 1 tablet (4 mg total) by mouth every 6 (six) hours as needed.   ONE-A-DAY 50 PLUS PO Take 1 tablet by mouth daily.      Follow-up Information    Rogers Blocker, MD. Go on 08/29/2017.   Specialty:  Internal Medicine Why:  @2 :30pm as already scheduled Contact information: [place] [address] [phone]        Jacolyn Reedy, MD. Go on 09/09/2017.   Specialty:  Cardiology Why:  @9 :30am Contact information: [address] [place] [address] [phone]          Allergies  Allergen Reactions  . Morphine And Related Nausea And Vomiting    Chest  pain    . Lisinopril Cough  . Sertraline Hcl Nausea Only  . Zoloft [Sertraline Hcl] Nausea Only         Consultations:  Cardiology   Procedures/Studies: Dg Chest 2 View  Result Date: 08/25/2017 CLINICAL DATA:  82 year old female with shortness of breath. EXAM: CHEST - 2 VIEW COMPARISON:  Chest radiograph dated 07/08/2017 FINDINGS: There is mild cardiomegaly. There is diffuse interstitial prominence consistent with interstitial edema. Pneumonia is not excluded. Clinical correlation is recommended. Small bilateral pleural effusions may be present. There is no focal consolidation or pneumothorax. Median sternotomy wires noted. No acute osseous pathology. IMPRESSION: Cardiomegaly with interstitial edema and probable small pleural effusions. No focal consolidation. Electronically Signed   By: Anner Crete M.D.   On: 08/25/2017 04:39   Ct Angio Chest Pe W Or Wo Contrast  Result Date: 08/25/2017 CLINICAL DATA:  82 year old female with shortness of breath, positive D-dimer and intermediate probability for acute pulmonary embolus EXAM: CT ANGIOGRAPHY CHEST WITH CONTRAST TECHNIQUE: Multidetector CT imaging of the chest was performed using the standard protocol during bolus administration of  intravenous contrast. Multiplanar CT image reconstructions and MIPs were obtained to evaluate the vascular anatomy. CONTRAST:  116mL ISOVUE-370 IOPAMIDOL (ISOVUE-370) INJECTION 76% COMPARISON:  Chest x-ray 08/25/2017 FINDINGS: Cardiovascular: Adequate opacification of the pulmonary arteries to the proximal subsegmental level. No evidence of focal filling defect to suggest acute pulmonary embolus. The pulmonary artery is normal in size. Three vessel aortic arch. No evidence of aortic aneurysm. Extensive atherosclerotic calcifications present throughout the thoracic aorta and the origins of the branch vessels. Patient is status post prior median sternotomy with multivessel CABG. Cardiomegaly with biatrial enlargement. No pericardial effusion. Mediastinum/Nodes: Unremarkable CT appearance of the thyroid gland. No suspicious mediastinal or hilar adenopathy. No soft tissue mediastinal mass. The thoracic esophagus is unremarkable. Lungs/Pleura: Small right and trace left layering pleural effusions. Biapical pleuroparenchymal scarring is present. Mild interlobular septal thickening with scattered areas of ground-glass attenuation airspace opacity. There is respiratory motion which limits evaluation for pulmonary nodules. Several ground-glass attenuation nodular opacities are present bilaterally which may represent rounded areas of alveolar edema, or a multifocal infectious/inflammatory process. Mild subpleural reticulation in the periphery of the bases consistent with fibrotic change. Bilateral lower lobe atelectasis secondary to the presence of the pleural effusions. Upper Abdomen: Abnormal hepatic morphology. The contour appears nodular and there is relative hypertrophy of the caudate lobe with atrophy of the right hepatic lobe. Findings suggest underlying cirrhosis. No acute abnormality in the visualized upper abdomen. Musculoskeletal: No acute fracture or aggressive appearing lytic or blastic osseous lesion. Bilateral  acromioclavicular joint osteoarthritis. Review of the MIP images confirms the above findings. IMPRESSION: 1. Negative for acute pulmonary embolus. 2. The constellation of findings (cardiomegaly with bilateral layering pleural effusions and pulmonary edema) is most consistent with congestive heart failure. 3. Scattered patchy nodular airspace opacities are present bilaterally. These may represent relatively rounded areas of focal alveolar edema, a superimposed multifocal infectious/inflammatory process, or underlying pulmonary nodules which are partially obscured by the edema. Recommend repeat CT scan of the chest without intravenous contrast when the patient has recovered from the this episode of acute CHF. 4. Pulmonary parenchymal changes suggest mild pulmonary fibrosis in the periphery of the lower lungs. 5. Suspect hepatic cirrhosis. 6.  Aortic Atherosclerosis (ICD10-170.0) Electronically Signed   By: Jacqulynn Cadet M.D.   On: 08/25/2017 08:20   Dg Knee Complete 4 Views Left  Result Date: 08/25/2017 CLINICAL DATA:  [age]-year-old female with  left knee pain and swelling. EXAM: LEFT KNEE - COMPLETE 4+ VIEW COMPARISON:  None. FINDINGS: There is no acute fracture or dislocation. Mild osteopenia. There is mild arthritic changes with narrowing of the medial compartment and meniscal chondrocalcinosis of the lateral compartment. No joint effusion. There is diffuse subcutaneous edema. IMPRESSION: 1. No acute fracture or dislocation. 2. Mild arthritic changes. 3. Diffuse subcutaneous edema may represent cellulitis. Clinical correlation is recommended. Electronically Signed   By: Anner Crete M.D.   On: 08/25/2017 04:40   Echocardiogram 5/10 Study Conclusions  - Left ventricle: The cavity size was normal. Wall thickness was   increased in a pattern of mild LVH. Systolic function was normal.   The estimated ejection fraction was in the range of 50% to 55%.   Suspected diastolic dysfunction and elevated  LV filling pressure   (a-fib is noted). - Aortic valve: Calcified with restricted leaflet motion. There is   likely severe stenosis. Trivial regurgitation. Mean gradient (S):   21 mm Hg. Peak gradient (S): 34 mm Hg. Valve area (VTI): 0.9   cm^2. Valve area (Vmax): 0.81 cm^2. Valve area (Vmean): 0.83   cm^2. - Mitral valve: Calcified annulus. Mildly thickened leaflets . - Left atrium: Severely dilated. - Right ventricle: The cavity size was mildly dilated. Moderately   reduced systolic function. - Right atrium: The atrium was mildly dilated. - Inferior vena cava: The vessel was dilated. The respirophasic   diameter changes were blunted (< 50%), consistent with elevated   central venous pressure.  Impressions:  - Compared to a prior study in 01/2017, the LVEF is unchanged.   There is now likely severe aortic stenosis with an AVA around 0.9   cm2, there is severe LAE and diastolic dysfunction with elevated   LV filling pressure.    Subjective: Feels well.  No orthopnea, chest pain, dyspnea on exertion.  No leg redness or pain.  No fever.  Discharge Exam: Vitals:   08/29/17 0441 08/29/17 0822  BP: (!) 140/58 (!) 175/70  Pulse: 70 76  Resp: 18   Temp: 98.2 F (36.8 C)   SpO2: 95%    Vitals:   08/28/17 2109 08/28/17 2109 08/29/17 0441 08/29/17 0822  BP: (!) 150/55 (!) 150/55 (!) 140/58 (!) 175/70  Pulse: 67 67 70 76  Resp:  12 18   Temp:  98.7 F (37.1 C) 98.2 F (36.8 C)   TempSrc:  Oral Oral   SpO2:   95%   Weight:   93.6 kg (206 lb 6.4 oz)   Height:        General: Pt is alert, awake, not in acute distress Cardiovascular: RRR, Y6/V7 +, loud systolic mrumur, no rubs, no gallops Respiratory: CTA bilaterally, no wheezing, no rhonchi Abdominal: Soft, NT, ND, bowel sounds + Extremities: wine bottle deformity, trace knee edema, overall much improved, no cyanosis    The results of significant diagnostics from this hospitalization (including imaging, microbiology,  ancillary and laboratory) are listed below for reference.     Microbiology: Recent Results (from the past 240 hour(s))  Culture, blood (Routine X 2) w Reflex to ID Panel     Status: None (Preliminary result)   Collection Time: 08/25/17  8:28 AM  Result Value Ref Range Status   Specimen Description BLOOD LEFT ANTECUBITAL  Final   Special Requests   Final    BOTTLES DRAWN AEROBIC AND ANAEROBIC Blood Culture results may not be optimal due to an excessive volume of blood received in culture bottles  Culture   Final    NO GROWTH 4 DAYS Performed at [facility], [place] [address] [place], [place] [address]    Report Status PENDING  Incomplete  Culture, blood (Routine X 2) w Reflex to ID Panel     Status: None (Preliminary result)   Collection Time: 08/25/17  8:29 AM  Result Value Ref Range Status   Specimen Description BLOOD RIGHT FOREARM  Final   Special Requests   Final    BOTTLES DRAWN AEROBIC AND ANAEROBIC Blood Culture results may not be optimal due to an excessive volume of blood received in culture bottles   Culture   Final    NO GROWTH 4 DAYS Performed at [facility], [place] [address], [place], [place] [address]    Report Status PENDING  Incomplete     Labs: BNP (last 3 results) Recent Labs    06/10/17 1927 07/08/17 0606 08/25/17 0323  BNP 561.6* 396.3* 233.4*   Basic Metabolic Panel: Recent Labs  Lab 08/25/17 0410 08/26/17 0259 08/27/17 0519 08/28/17 0609 08/29/17 0523  NA 137 138 138 140 142  K 3.7 3.5 3.0* 3.8 4.2  CL 103 103 100* 105 108  CO2 26 27 30 30 29   GLUCOSE 237* 180* 150* 96 64*  BUN 16 14 20  24* 20  CREATININE 1.07* 1.09* 1.21* 1.29* 1.19*  CALCIUM 8.8* 8.6* 8.6* 8.8* 9.1   Liver Function Tests: Recent Labs  Lab 08/25/17 0410  AST 43*  ALT 27  ALKPHOS 135*  BILITOT 1.3*  PROT 7.2  ALBUMIN 2.5*   No results for input(s): LIPASE, AMYLASE in the last 168 hours. No results for input(s): AMMONIA in the last 168  hours. CBC: Recent Labs  Lab 08/25/17 0410  WBC 9.5  NEUTROABS 7.0  HGB 11.0*  HCT 32.2*  MCV 94.4  PLT 325   Cardiac Enzymes: Recent Labs  Lab 08/25/17 1541 08/25/17 2143 08/26/17 0259  TROPONINI 0.05* 0.05* 0.06*   BNP: Invalid input(s): POCBNP CBG: Recent Labs  Lab 08/28/17 0716 08/28/17 1149 08/28/17 1631 08/28/17 2106 08/29/17 0752  GLUCAP 84 196* 200* 225* 91   D-Dimer No results for input(s): DDIMER in the last 72 hours. Hgb A1c No results for input(s): HGBA1C in the last 72 hours. Lipid Profile No results for input(s): CHOL, HDL, LDLCALC, TRIG, CHOLHDL, LDLDIRECT in the last 72 hours. Thyroid function studies No results for input(s): TSH, T4TOTAL, T3FREE, THYROIDAB in the last 72 hours.  Invalid input(s): FREET3 Anemia work up No results for input(s): VITAMINB12, FOLATE, FERRITIN, TIBC, IRON, RETICCTPCT in the last 72 hours. Urinalysis    Component Value Date/Time   COLORURINE YELLOW 07/08/2017 1101   APPEARANCEUR CLOUDY (A) 07/08/2017 1101   LABSPEC 1.010 07/08/2017 1101   PHURINE 7.0 07/08/2017 1101   GLUCOSEU NEGATIVE 07/08/2017 1101   HGBUR MODERATE (A) 07/08/2017 1101   BILIRUBINUR NEGATIVE 07/08/2017 1101   KETONESUR NEGATIVE 07/08/2017 1101   PROTEINUR >=300 (A) 07/08/2017 1101   UROBILINOGEN 1.0 05/29/2014 1052   NITRITE POSITIVE (A) 07/08/2017 1101   LEUKOCYTESUR LARGE (A) 07/08/2017 1101   Sepsis Labs Invalid input(s): PROCALCITONIN,  WBC,  LACTICIDVEN Microbiology Recent Results (from the past 240 hour(s))  Culture, blood (Routine X 2) w Reflex to ID Panel     Status: None (Preliminary result)   Collection Time: 08/25/17  8:28 AM  Result Value Ref Range Status   Specimen Description BLOOD LEFT ANTECUBITAL  Final   Special Requests   Final  BOTTLES DRAWN AEROBIC AND ANAEROBIC Blood Culture results may not be optimal due to an excessive volume of blood received in culture bottles   Culture   Final    NO GROWTH 4 DAYS Performed  at [facility] [address], [place], [place] [address]    Report Status PENDING  Incomplete  Culture, blood (Routine X 2) w Reflex to ID Panel     Status: None (Preliminary result)   Collection Time: 08/25/17  8:29 AM  Result Value Ref Range Status   Specimen Description BLOOD RIGHT FOREARM  Final   Special Requests   Final    BOTTLES DRAWN AEROBIC AND ANAEROBIC Blood Culture results may not be optimal due to an excessive volume of blood received in culture bottles   Culture   Final    NO GROWTH 4 DAYS Performed at [facility], [place] [address] [place], [place] [address]    Report Status PENDING  Incomplete     Time coordinating discharge: 40 minutes       SIGNED:   Edwin Dada, MD  [facility] 08/29/2017, 2:56 PM

## 2017-08-29 NOTE — Discharge Instructions (Signed)

## 2017-08-29 NOTE — Care Management Important Message (Signed)
Important Message  Patient Details  Name: Kristina Summers MRN: [id number] Date of Birth: Aug 19, 1935   Medicare Important Message Given:  Yes    Isaah Furry P [place] 08/29/2017, 2:43 PM

## 2017-08-29 NOTE — Progress Notes (Signed)
Dr Loleta Books paged when daughter arrived per his request, he came to room and discuss poc with pt and family, avs gi ven to pt and family, also reviewed living better with heart failure booklet, all questions entertained and answered, pt dc home via wheelchair in stable condition with daughters x2 and CNA

## 2017-08-30 LAB — CULTURE, BLOOD (ROUTINE X 2)
CULTURE: NO GROWTH
Culture: NO GROWTH

## 2017-09-09 ENCOUNTER — Encounter: Payer: Self-pay | Admitting: Cardiology

## 2017-09-09 NOTE — Progress Notes (Signed)
Kristina Summers    Date of visit:  09/09/2017 DOB:  09/17/1935    Age:  82 yrs. Medical record number:  [id number]     Account number:  [id number] Primary Care Provider: Marlou Sa, ERIC L ____________________________ CURRENT DIAGNOSES  1. CAD Native without angina  2. Persistent atrial fibrillation  3. Aortic valve stenosis  4. Long term (current) use of anticoagulants  5. Type 2 diabetes mellitus with diabetic polyneuropathy  6. Obesity  7. Hyperlipidemia  8. Hypertensive heart disease without heart failure  9. Cerebral Infarction Due To Embolism Of Left Middle Cerebral Artery  10. 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 Old myocardial infarction  65. Presence of aortocoronary bypass graft ____________________________ ALLERGIES  JANUVIA, Intolerance-dizziness  Lisinopril, Intolerance-cough  Morphine, Severe nausea & vomiting ____________________________ MEDICATIONS  1. amlodipine 5 mg tablet, 1 p.o. daily  2. atorvastatin 80 mg tablet, 1 p.o. daily  3. Eliquis 5 mg tablet, BID  4. furosemide 40 mg tablet, BID  5. Humulin R Regular U-100 Insulin 100 unit/mL injection solution, Take as directed  6. losartan 50 mg tablet, BID  7. metoprolol tartrate 25 mg tablet, BID  8. multivitamin tablet, 1 p.o. daily  9. nitroglycerin 0.4 mg sublingual tablet, PRN  10. Novolin N NPH U-100 Insulin isophane 100 unit/mL subcutaneous susp, 37 units BID ____________________________ CHIEF COMPLAINTS  Followup of Aortic valve stenosis ____________________________ HISTORY OF PRESENT ILLNESS Patient returns for cardiac followup. She was admitted to the hospital with lower extremity cellulitis as well as congestive heart failure. She was diuresed with intravenous Lasix and had marked improvement in the edema of further lower legs as well as her cellulitis after treatment. She had an echocardiogram while she was in the hospital that showed progression  over aortic stenosis. Her second heart sound is still well preserved but she has now had 2 admissions with worsening heart failure. She does not have angina. She does have dyspnea when she walks up hills. She also plays a harmonica in a band and for the most part is able to do this without wheezing or shortness of breath. She remains obese. She does not have PND orthopnea. ____________________________ PAST HISTORY  Past Medical Illnesses:  DM-insulin dependent, hypertension, hyperlipidemia, obesity, carpal tunnel syndrome, GI bleed 1/11, left MCA stroke 2016;  Cardiovascular Illnesses:  CAD, S/P MI-inferior 1992, atrial fibrillation, mild AV disease, peripheral vascular disease, aortic stenosis;  Surgical Procedures:  CABG w LIMA LAD, SVG to dx, SVG to OM, SVG to PD-PL 11/1998 Dr. Cyndia Bent, Excision of neck mass 1996 Dr. Lucia Gaskins, bilateral trigger thumb release;  NYHA Classification:  II;  [place] Angina Classification:  Class 0: Asymptomatic;  Cardiology Procedures-Invasive:  cardiac cath (left) 2000, PTCA RCA 1993;  Cardiology Procedures-Noninvasive:  event monitor September 2010, lexiscan cardiolite November 2012, echocardiogram February 2016, echocardiogram October 2018, echocardiogram May 2019;  Cardiac Cath Results:  20 % stenosis distal Left main, 60% stenosis mid LAD, 70% stenosis proximal CFX, occluded RCA;  Peripheral Vascular Procedures:  stent right common iliac 03/31/2012 Dr. Bridgett Larsson, atherectomy left common femoral 06/17/11 Dr. Bridgett Larsson, carotid doppler September 2014;  LVEF of 60% documented via echocardiogram on 08/16/2016,   ____________________________ CARDIO-PULMONARY TEST DATES EKG Date:  02/15/2017;   Cardiac Cath Date:  11/13/1998;  CABG: 11/20/1998;  Holter/Event Monitor Date: 12/24/2008;  Nuclear Study Date:  03/18/2011;  Echocardiography Date: 08/25/2017;  Chest Xray Date: 08/25/2017;   ____________________________ FAMILY HISTORY Brother -- Brother dead, Diabetes mellitus, Cardiovascular  disease  Brother -- Brother alive with problem, Type 2 Diabetes Father -- Father dead, Myocardial infarction at greater than 32 Mother -- Mother dead, CVA ____________________________ SOCIAL HISTORY Alcohol Use:  no alcohol use;  Smoking:  used to smoke but quit;  Diet:  regular diet without modifications;  Lifestyle:  divorced and native of [place];  Exercise:  exercise is limited due to physical disability;  Occupation:  disabled;  Residence:  lives alone;  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;   ____________________________ REVIEW OF SYSTEMS General:  obesity, malaise and fatigue, no change in weight Eyes: diabetic retinopathy, laser treatments, repair of macular pucker, decreased acuity Respiratory: dyspnea with exertion Cardiovascular:  please review HPI Abdominal: denies dyspepsia, GI bleeding, constipation, or diarrheaGenitourinary-Female: frequency, stress incontinence, urgency Musculoskeletal:  generalized arthritis Neurological:  mild expressive aphasia, unsteady gait  ____________________________ PHYSICAL EXAMINATION VITAL SIGNS  Blood Pressure:  142/68 Sitting, Left arm, large cuff  , 138/62  , 138/64 Standing, Left arm and large cuff   Pulse:  82/min. Weight:  210.00 lbs. Height:  65.00"BMI: 35  Constitutional:  pleasant white female, in no acute distress, moderately obese Skin:  warm and dry to touch, no apparent skin lesions, or masses noted. Head:  normocephalic, normal hair pattern, no masses or tenderness ENT:  ears, nose and throat unremarkable, full mouth dentures present Neck:  bilateral carotid bruits, no JVD, no masses Chest:  clear to auscultation, healed median sternotomy scar Cardiac:  irregular rhythm, normal S1 and S2, no S3 or S4, grade 2/6 systolic murmur at aortic area radiating to neck Peripheral Pulses:  femoral pulses 2+, posterior tibial pulses palpable Extremities & Back:  well healed saphenous vein donor site RLE, 1+ ankle edema right  leg, bilateral lower extremity venous varicosities Neurological:  decreased sensation in feet, mild expressive aphasia, unsteady gait ____________________________ MOST RECENT LIPID PANEL 09/02/15  CHOL TOTL 126 mg/dl, LDL 74 NM, HDL 26 mg/dl, TRIGLYCER 132 mg/dl and CHOL/HDL 4.9 (Calc) ____________________________ IMPRESSIONS/PLAN  1. Recent hospital admission for congestive heart failure 2. Aortic stenosis moderate to severe with symptoms progression noted recently by echo 3. Coronary artery disease with previous bypass grafting 4. Persistent atrial fibrillation 5. History of stroke with residual aphasia 6. Long-term use of anticoagulation  Recommendations:  Discussed with patient. Has now had 2 recent admissions with some congestive heart failure. Aortic stenosis appears to have progressed somewhat since last year old below her second heart sound is preserved. My recommendations would be for her to have a consultation with the structural heart disease specialist to see if perhaps the aortic stenosis is contributing to her symptoms. She also has bypass grafts are quite old. Recommended followup in 3 months with me. Continue current medications. ____________________________ TODAYS ORDERS  1. Return Visit: 3 months                       ____________________________ Cardiology Physician:  Kerry Hough MD [facility] [place]

## 2017-09-27 ENCOUNTER — Institutional Professional Consult (permissible substitution): Payer: Medicare Other | Admitting: Cardiovascular Disease

## 2017-10-06 ENCOUNTER — Encounter (HOSPITAL_BASED_OUTPATIENT_CLINIC_OR_DEPARTMENT_OTHER): Payer: Medicare Other | Attending: Internal Medicine

## 2017-10-06 DIAGNOSIS — I509 Heart failure, unspecified: Secondary | ICD-10-CM | POA: Insufficient documentation

## 2017-10-06 DIAGNOSIS — I350 Nonrheumatic aortic (valve) stenosis: Secondary | ICD-10-CM | POA: Diagnosis not present

## 2017-10-06 DIAGNOSIS — E1151 Type 2 diabetes mellitus with diabetic peripheral angiopathy without gangrene: Secondary | ICD-10-CM | POA: Diagnosis not present

## 2017-10-06 DIAGNOSIS — E11621 Type 2 diabetes mellitus with foot ulcer: Secondary | ICD-10-CM | POA: Insufficient documentation

## 2017-10-06 DIAGNOSIS — I2510 Atherosclerotic heart disease of native coronary artery without angina pectoris: Secondary | ICD-10-CM | POA: Diagnosis not present

## 2017-10-06 DIAGNOSIS — E1140 Type 2 diabetes mellitus with diabetic neuropathy, unspecified: Secondary | ICD-10-CM | POA: Diagnosis not present

## 2017-10-06 DIAGNOSIS — I252 Old myocardial infarction: Secondary | ICD-10-CM | POA: Diagnosis not present

## 2017-10-06 DIAGNOSIS — L97511 Non-pressure chronic ulcer of other part of right foot limited to breakdown of skin: Secondary | ICD-10-CM | POA: Diagnosis not present

## 2017-10-06 DIAGNOSIS — Z87891 Personal history of nicotine dependence: Secondary | ICD-10-CM | POA: Diagnosis not present

## 2017-10-06 DIAGNOSIS — I110 Hypertensive heart disease with heart failure: Secondary | ICD-10-CM | POA: Diagnosis not present

## 2017-10-07 ENCOUNTER — Encounter (INDEPENDENT_AMBULATORY_CARE_PROVIDER_SITE_OTHER): Payer: Self-pay

## 2017-10-07 ENCOUNTER — Ambulatory Visit (INDEPENDENT_AMBULATORY_CARE_PROVIDER_SITE_OTHER): Payer: Medicare Other | Admitting: Cardiovascular Disease

## 2017-10-07 ENCOUNTER — Encounter: Payer: Self-pay | Admitting: Cardiovascular Disease

## 2017-10-07 ENCOUNTER — Telehealth: Payer: Self-pay | Admitting: Cardiovascular Disease

## 2017-10-07 VITALS — BP 128/40 | HR 60 | Ht 66.0 in | Wt 202.0 lb

## 2017-10-07 DIAGNOSIS — I70213 Atherosclerosis of native arteries of extremities with intermittent claudication, bilateral legs: Secondary | ICD-10-CM

## 2017-10-07 DIAGNOSIS — Z01812 Encounter for preprocedural laboratory examination: Secondary | ICD-10-CM | POA: Diagnosis not present

## 2017-10-07 DIAGNOSIS — I350 Nonrheumatic aortic (valve) stenosis: Secondary | ICD-10-CM

## 2017-10-07 LAB — CBC
Hematocrit: 29.9 % — ABNORMAL LOW (ref 34.0–46.6)
Hemoglobin: 9.8 g/dL — ABNORMAL LOW (ref 11.1–15.9)
MCH: 31.1 pg (ref 26.6–33.0)
MCHC: 32.8 g/dL (ref 31.5–35.7)
MCV: 95 fL (ref 79–97)
PLATELETS: 331 10*3/uL (ref 150–450)
RBC: 3.15 x10E6/uL — AB (ref 3.77–5.28)
RDW: 14.6 % (ref 12.3–15.4)
WBC: 7.8 10*3/uL (ref 3.4–10.8)

## 2017-10-07 LAB — BASIC METABOLIC PANEL
BUN / CREAT RATIO: 30 — AB (ref 12–28)
BUN: 36 mg/dL — ABNORMAL HIGH (ref 8–27)
CHLORIDE: 103 mmol/L (ref 96–106)
CO2: 23 mmol/L (ref 20–29)
Calcium: 9.5 mg/dL (ref 8.7–10.3)
Creatinine, Ser: 1.21 mg/dL — ABNORMAL HIGH (ref 0.57–1.00)
GFR calc Af Amer: 49 mL/min/{1.73_m2} — ABNORMAL LOW (ref >59)
GFR calc non Af Amer: 42 mL/min/{1.73_m2} — ABNORMAL LOW (ref >59)
Glucose: 28 mg/dL — CL (ref 65–99)
POTASSIUM: 4.4 mmol/L (ref 3.5–5.2)
Sodium: 139 mmol/L (ref 134–144)

## 2017-10-07 NOTE — Patient Instructions (Addendum)
Medication Instructions:  Your physician recommends that you continue on your current medications as directed. Please refer to the Current Medication list given to you today.  Labwork: BMET and CBC today  Testing/Procedures: Your physician has requested that you have a cardiac catheterization. Cardiac catheterization is used to diagnose and/or treat various heart conditions. Doctors may recommend this procedure for a number of different reasons. The most common reason is to evaluate chest pain. Chest pain can be a symptom of coronary artery disease (CAD), and cardiac catheterization can show whether plaque is narrowing or blocking your heart's arteries. This procedure is also used to evaluate the valves, as well as measure the blood flow and oxygen levels in different parts of your heart. For further information please visit [email or web address]. Please follow instruction sheet, as given.   Follow-Up: Kristina Quay, RN will be in contact with you to schedule further appointments.  Any Other Special Instructions Will Be Listed Below (If Applicable).    [place] [facility] [address], [place] [address] Dept: [phone] Loc: [place]  10/07/2017  You are scheduled for a Cardiac Catheterization on Tuesday, June 25 with Dr. Sherren Mocha.  1. Please arrive at the [facility] (Main Entrance A) at [facility]: [address] [place], [place] [address] at 11:30 AM (two hours before your procedure to ensure your preparation). Free valet parking service is available.   Special note: Every effort is made to have your procedure done on time. Please understand that emergencies sometimes delay scheduled procedures.  2. Diet: Do not eat or drink anything after midnight prior to your procedure except sips of water to take medications.  3. Labs: You will have labs drawn today.  4.  Medication instructions in preparation for your procedure:  *For reference purposes while preparing patient instructions.   Delete this med list prior to printing instructions for patient.*  Stop taking Eliquis (Apixiban) on Monday, June 25.  Do not take your Humilin insulin the day of the procedure. Only take a half dose of your Humilin N the night before the procedure.  On the morning of your procedure, take your Aspirin and any morning medicines NOT listed above.  You may use sips of water.  5. Plan for one night stay--bring personal belongings. 6. Bring a current list of your medications and current insurance cards. 7. You MUST have a responsible person to drive you home. 8. Someone MUST be with you the first 24 hours after you arrive home or your discharge will be delayed. 9. Please wear clothes that are easy to get on and off and wear slip-on shoes.  Thank you for allowing [place] to care for you!   -- [facility]    If you need a refill on your cardiac medications before your next appointment, please call your pharmacy.

## 2017-10-07 NOTE — H&P (View-Only) (Signed)
Cardiology Office Note Date:  10/07/2017   ID:  Kristina Summers, DOB 1935/04/24, MRN [id number]  PCP:  Rogers Blocker, MD  Cardiologist:  Tollie Eth, MD  Chief Complaint  Patient presents with  . Shortness of Breath     History of Present Illness: Kristina Summers is a 82 y.o. female who presents for evaluation of aortic stenosis.   The patient is here alone today. She has a history of inferior MI in 1992 and ultimately was treated with multivessel CABG in 2000 by Dr Cyndia Bent. In 2016 the patient had a stroke and has residual expressive aphasia. She ambulates with a walker, but remains functionally independent. She has been anticoagulated with apixaban for secondary stroke prevention since then. She previously had GI bleeding on warfarin and this had been discontinued. The patient also has significant PAD and has undergone several revascularization procedures including right common iliac stenting and orbital atherectomy/PTA procedures on the left SFA. She ambulates with a walker and has fairly advanced vision loss. Despite all of her problems, she remains independent. She lives in an apartment just next to [place] is able to do her own shopping.   She is short of breath with walking, especially if walking up a grade. Last month she was hospitalized with acute on chronic diastolic heart failure and was treated with IV diuresis. This was her second hospitalization for heart failure in the past year (October 2018). Since discharge she has felt better on her current Rx.  She denies chest pain or pressure.  She denies lightheadedness or syncope.  She has also been limited by her right foot ulcer and has been undergoing treatment at the wound center.  Her right leg is wrapped today.  She states that the ulcer is improving.  She is been followed closely by Dr. Bridgett Larsson with vascular surgery.  The patient is originally from [place].  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  She is lived in [place] now for many years.  Her son and 2 daughters live locally.  Past Medical History:  Diagnosis Date  . Angina   . Arthritis   . Atrial fibrillation ([place])   . CAD (coronary artery disease)    a. s/p CABG in 2000  . Carotid artery occlusion   . Diabetes mellitus   . Diastolic CHF ([place]) 43/1540  . Fibromyalgia   . GERD (gastroesophageal reflux disease)   . GI bleed 1/11  . Hyperlipidemia   . Hypertension   . Myocardial infarction ([place]) ~ 1991  . Normal nuclear stress test 03/18/2011  . Obesity   . Peripheral vascular disease ([place])   . Stroke [facility])    May 24, 2014  . UTI (lower urinary tract infection) Sept. 2015    Past Surgical History:  Procedure Laterality Date  . ABDOMINAL AORTAGRAM N/A 04/01/2011   Procedure: ABDOMINAL AORTAGRAM;  Surgeon: Conrad Cedartown, MD;  Location: [facility] CATH LAB;  Service: Cardiovascular;  Laterality: N/A;  . ANGIOPLASTY  06/17/11   Left leg common femoral artery cannulation under u/s Left leg runoff  . CATARACT EXTRACTION W/ INTRAOCULAR LENS  IMPLANT, BILATERAL  2004-2005  . CORONARY ARTERY BYPASS GRAFT  2000   CABG X5  . EYE SURGERY    . LOWER EXTREMITY ANGIOGRAM Bilateral 04/01/2011   Procedure: LOWER EXTREMITY ANGIOGRAM;  Surgeon: Conrad Kearny, MD;  Location: [facility] CATH LAB;  Service: Cardiovascular;  Laterality:  Bilateral;  . LOWER EXTREMITY ANGIOGRAM Left 06/17/2011   Procedure: LOWER EXTREMITY ANGIOGRAM;  Surgeon: Conrad Tradewinds, MD;  Location: [facility] CATH LAB;  Service: Cardiovascular;  Laterality: Left;  . LOWER EXTREMITY ANGIOGRAM N/A 11/18/2011   Procedure: LOWER EXTREMITY ANGIOGRAM;  Surgeon: Conrad [place]ley [place], MD;  Location: [facility] CATH LAB;  Service: Cardiovascular;  Laterality: N/A;  . PERCUTANEOUS STENT INTERVENTION Right 04/01/2011   Procedure: PERCUTANEOUS STENT INTERVENTION;  Surgeon: Conrad Liebenthal, MD;  Location: [facility] CATH LAB;  Service: Cardiovascular;  Laterality: Right;  rt iliac stent  .  PERIPHERAL ARTERIAL STENT GRAFT  04/01/11   right common iliac  . TRIGGER FINGER RELEASE  1996   right thumb    Current Outpatient Medications  Medication Sig Dispense Refill  . acetaminophen (TYLENOL) 325 MG tablet Take 650 mg by mouth every 6 (six) hours as needed for headache.    Marland Kitchen amLODipine (NORVASC) 10 MG tablet Take 10 mg by mouth daily.    Marland Kitchen apixaban (ELIQUIS) 5 MG TABS tablet Take 1 tablet (5 mg total) by mouth 2 (two) times daily. 60 tablet 0  . atorvastatin (LIPITOR) 80 MG tablet Take 1 tablet (80 mg total) by mouth daily. (Patient taking differently: Take 80 mg by mouth at bedtime. ) 30 tablet 0  . furosemide (LASIX) 40 MG tablet Take 40 mg by mouth 2 (two) times daily.  0  . glucose 4 GM chewable tablet Chew 4 tablets by mouth once.    Marland Kitchen HUMULIN R 100 UNIT/ML injection Inject 5 units subcutaneously BEFORE BREAKFAST AND DINNER  0  . insulin NPH Human (HUMULIN N,NOVOLIN N) 100 UNIT/ML injection Inject 0.38 mLs (38 Units total) into the skin 2 (two) times daily. 38 in am and 34 at night (Patient taking differently: Inject 26-57 Units into the skin See admin instructions. Use 37 units every morning and use 22 units every evening) 10 mL 11  . losartan (COZAAR) 50 MG tablet Take 1 tablet (50 mg total) by mouth daily. (Patient taking differently: Take 50 mg by mouth 2 (two) times daily. )    . metoprolol tartrate (LOPRESSOR) 25 MG tablet Take 25 mg by mouth 2 (two) times daily.     . Multiple Vitamins-Minerals (ONE-A-DAY 50 PLUS PO) Take 1 tablet by mouth daily.    . mupirocin ointment (BACTROBAN) 2 % Apply daily to right foot ulceration 30 g 0  . mupirocin ointment (BACTROBAN) 2 % Apply to affected area daily. 22 g 2  . NITROSTAT 0.4 MG SL tablet Place 1 tablet under the tongue every 5 (five) minutes x 3 doses as needed for chest pain. Chest pain  1   No current facility-administered medications for this visit.     Allergies:   Morphine and related; Lisinopril; Morphine; Sertraline  hcl; and Zoloft [sertraline hcl]   Social History:  The patient  reports that she quit smoking about 27 years ago. Her smoking use included cigarettes. She has a 60.00 pack-year smoking history. She has never used smokeless tobacco. She reports that she does not drink alcohol or use drugs.   Family History:  The patient's family history includes Diabetes in her brother; Other in her brother.    ROS:  Please see the history of present illness.   All other systems are reviewed and negative.    PHYSICAL EXAM: VS:  BP (!) 128/40   Pulse 60   Ht 5\' 6"  (1.676 m)   Wt 202 lb (91.6 kg)   SpO2 98%  BMI 32.60 kg/m  , BMI Body mass index is 32.6 kg/m. GEN: Pleasant elderly woman, in no acute distress  HEENT: normal  Neck: no JVD, no masses. bilateral carotid bruits Cardiac: irregular with grade 3/6 harsh mid-late peaking systolic murmur at the RUSB, A2 audible            Respiratory:  clear to auscultation bilaterally, normal work of breathing GI: soft, nontender, nondistended, + BS MS: no deformity or atrophy  Ext: 1+ pretibial edema Skin: distal leg erythema noted Neuro:  Strength and sensation are intact Psych: euthymic mood, full affect  EKG:  EKG is not ordered today.  Recent Labs: 06/13/2017: Magnesium 2.2 08/25/2017: ALT 27; B Natriuretic Peptide 432.3; Hemoglobin 11.0; Platelets 325 08/29/2017: BUN 20; Creatinine, Ser 1.19; Potassium 4.2; Sodium 142   Lipid Panel     Component Value Date/Time   CHOL 92 18-May-202016 1006   TRIG 165 (H) 18-May-202016 1006   HDL 11 (L) 18-May-202016 1006   CHOLHDL 8.4 18-May-202016 1006   VLDL 33 18-May-202016 1006   LDLCALC 48 18-May-202016 1006      Wt Readings from Last 3 Encounters:  10/07/17 202 lb (91.6 kg)  08/29/17 206 lb 6.4 oz (93.6 kg)  07/08/17 212 lb (96.2 kg)     Cardiac Studies Reviewed: Echo: Impressions:  - Compared to a prior study in 01/2017, the LVEF is unchanged.   There is now likely severe aortic stenosis with an AVA around  0.9   cm2, there is severe LAE and diastolic dysfunction with elevated   LV filling pressure. Left ventricle:  The cavity size was normal. Wall thickness was increased in a pattern of mild LVH. Systolic function was normal. The estimated ejection fraction was in the range of 50% to 55%. Suspected diastolic dysfunction and elevated LV filling pressure (a-fib is noted).  ------------------------------------------------------------------- Aortic valve:  Calcified with restricted leaflet motion. There is likely severe stenosis. Trivial regurgitation.  Doppler:     VTI ratio of LVOT to aortic valve: 0.29. Valve area (VTI): 0.9 cm^2. Indexed valve area (VTI): 0.42 cm^2/m^2. Peak velocity ratio of LVOT to aortic valve: 0.26. Valve area (Vmax): 0.81 cm^2. Indexed valve area (Vmax): 0.38 cm^2/m^2. Mean velocity ratio of LVOT to aortic valve: 0.26. Valve area (Vmean): 0.83 cm^2. Indexed valve area (Vmean): 0.39 cm^2/m^2.    Mean gradient (S): 21 mm Hg. Peak gradient (S): 34 mm Hg.  ------------------------------------------------------------------- Aorta:  Aortic root: The aortic root was normal in size. Ascending aorta: The ascending aorta was normal in size.  ------------------------------------------------------------------- Mitral valve:   Calcified annulus. Mildly thickened leaflets . Doppler:  There was trivial regurgitation.    Peak gradient (D): 6 mm Hg.  ------------------------------------------------------------------- Left atrium:  Severely dilated.  ------------------------------------------------------------------- Atrial septum:  No defect or patent foramen ovale was identified.   ------------------------------------------------------------------- Right ventricle:  The cavity size was mildly dilated. Moderately reduced systolic function.  ------------------------------------------------------------------- Pulmonic valve:   Poorly visualized.  Doppler:  There was  no significant regurgitation.  ------------------------------------------------------------------- Tricuspid valve:   Doppler:  There was no significant regurgitation.  ------------------------------------------------------------------- Pulmonary artery:   Poorly visualized.  ------------------------------------------------------------------- Right atrium:  The atrium was mildly dilated.  ------------------------------------------------------------------- Pericardium:  There was no pericardial effusion.  ------------------------------------------------------------------- Systemic veins: Inferior vena cava: The vessel was dilated. The respirophasic diameter changes were blunted (< 50%), consistent with elevated central venous pressure.  ------------------------------------------------------------------- Measurements   Left ventricle  Value          Reference  LV ID, ED, PLAX chordal          (H)     55    mm       43 - 52  LV ID, ES, PLAX chordal          (H)     41    mm       23 - 38  LV fx shortening, PLAX chordal   (L)     25    %        >=29  LV PW thickness, ED                      13    mm       ----------  IVS/LV PW ratio, ED                      0.85           <=1.3  Stroke volume, 2D                        61    ml       ----------  Stroke volume/bsa, 2D                    29    ml/m^2   ----------    Ventricular septum                       Value          Reference  IVS thickness, ED                        11    mm       ----------    LVOT                                     Value          Reference  LVOT ID, S                               20    mm       ----------  LVOT area                                3.14  cm^2     ----------  LVOT peak velocity, S                    75.2  cm/s     ----------  LVOT mean velocity, S                    56    cm/s     ----------  LVOT VTI, S                              19.3  cm        ----------  Stroke volume (SV), LVOT DP              60.6  ml       ----------  Stroke index (SV/bsa), LVOT DP           28.4  ml/m^2   ----------    Aortic valve                             Value          Reference  Aortic valve peak velocity, S            292   cm/s     ----------  Aortic valve mean velocity, S            213   cm/s     ----------  Aortic valve VTI, S                      67    cm       ----------  Aortic mean gradient, S                  21    mm Hg    ----------  Aortic peak gradient, S                  34    mm Hg    ----------  VTI ratio, LVOT/AV                       0.29           ----------  Aortic valve area, VTI                   0.9   cm^2     ----------  Aortic valve area/bsa, VTI               0.42  cm^2/m^2 ----------  Velocity ratio, peak, LVOT/AV            0.26           ----------  Aortic valve area, peak velocity         0.81  cm^2     ----------  Aortic valve area/bsa, peak              0.38  cm^2/m^2 ----------  velocity  Velocity ratio, mean, LVOT/AV            0.26           ----------  Aortic valve area, mean velocity         0.83  cm^2     ----------  Aortic valve area/bsa, mean              0.39  cm^2/m^2 ----------  velocity    Aorta                                    Value          Reference  Aortic root ID, ED                       32    mm       ----------    Left atrium                              Value          Reference  LA ID, A-P, ES  45    mm       ----------  LA ID/bsa, A-P                           2.11  cm/m^2   <=2.2  LA volume, S                             103   ml       ----------  LA volume/bsa, S                         48.2  ml/m^2   ----------  LA volume, ES, 1-p A4C                   121   ml       ----------  LA volume/bsa, ES, 1-p A4C               56.6  ml/m^2   ----------  LA volume, ES, 1-p A2C                   85.5  ml       ----------  LA volume/bsa, ES, 1-p A2C               40     ml/m^2   ----------    Mitral valve                             Value          Reference  Mitral E-wave peak velocity              126   cm/s     ----------  Mitral deceleration time                 162   ms       150 - 230  Mitral peak gradient, D                  6     mm Hg    ----------    Right atrium                             Value          Reference  RA ID, S-I, ES, A4C              (H)     66.8  mm       34 - 49  RA area, ES, A4C                 (H)     20.9  cm^2     8.3 - 19.5  RA volume, ES, A/L                       55.2  ml       ----------  RA volume/bsa, ES, A/L                   25.8  ml/m^2   ----------    Right ventricle                          Value  Reference  RV ID, minor axis, ED, A4C base          35    mm       ----------  RV ID, minor axis, ED, A4C mid           34    mm       ----------  RV ID, major axis, ED, A4C               63    mm       55 - 91  TAPSE                                    11.5  mm       ----------  RV s&', lateral, S                        5.98  cm/s     ----------  STS Calculator Risk of Mortality:  7.259%  Renal Failure:  6.124%  Permanent Stroke:  5.364%  Prolonged Ventilation:  22.478%  DSW Infection:  0.517%  Reoperation:  2.937%  Morbidity or Mortality:  28.153%  Short Length of Stay:  8.871%  Long Length of Stay:  24.274%    ASSESSMENT AND PLAN: 82 yo woman with recent hospitalization for acute on chronic diastolic heart failure in the setting of what appears to be progressive, stage D1 aortic stenosis.  I have reviewed the natural history of aortic stenosis with the patient today. We have discussed the limitations of medical therapy and the poor prognosis associated with symptomatic aortic stenosis. We have reviewed potential treatment options, including palliative medical therapy, conventional surgical aortic valve replacement, and transcatheter aortic valve replacement. We discussed treatment options in the context  of the patient's specific comorbid medical conditions.   I have personally reviewed the patient's recent echo images.  Her LV function appears to be relatively well preserved.  While peak and mean transaortic valve gradients are in the moderate range, the aortic valve is severely calcified and restricted.  The valve appears trileaflet but there is very little leaflet mobility.  The aortic valve dimensionless index is 0.25.  With 2 heart failure hospitalizations over the past year, continued dyspnea, and echo findings described above, I am highly suspicious the patient has severe symptomatic aortic stenosis.  I have recommended right and left heart catheterization for further evaluation.  Will attempt a left radial approach considering her lower extremity PAD and history of coronary bypass grafting.  I have reviewed the risks, indications, and alternatives to cardiac catheterization, possible angioplasty, and stenting with the patient. Risks include but are not limited to bleeding, infection, vascular injury, stroke, myocardial infection, arrhythmia, kidney injury, radiation-related injury in the case of prolonged fluoroscopy use, emergency cardiac surgery, and death. The patient understands the risks of serious complication is 1-2 in 2122 with diagnostic cardiac cath and 1-2% or less with angioplasty/stenting.   Considering her advanced age, significant functional limitation, and multiple comorbid medical conditions outlined above with previous stroke, diabetes, peripheral arterial disease, and previous cardiac surgery, TAVR would clearly be the treatment of choice for this patient if she is found to have severe aortic stenosis and if she has appropriate anatomy for this treatment. Pending her cardiac catheterization results, will tentatively plan to proceed with CTA studies and formal cardiac surgical consultation with Dr Cyndia Bent who performed her  CABG in 2000.   Current medicines are reviewed with the patient  today.  The patient does not have concerns regarding medicines.  Labs/ tests ordered today include:   Orders Placed This Encounter  Procedures  . Basic metabolic panel  . CBC    Disposition:   As above  Signed, Sherren Mocha, MD  10/07/2017 12:45 PM    [address], [place], [place]  [address] Phone: [phone]; Fax: [phone]

## 2017-10-07 NOTE — Telephone Encounter (Signed)
Spoke with Pt's daughter regarding a critical glucose of 28, drawn around 11:30 this morning. Pt's daughter states she had just spoke with her mother at 3:40pm who was on her way to have dinner. I attempted to call pt before speaking with daughter, but daughter stated her mother was on the other line talking. Pt's daughter denies pt has any symptoms of hypoglycemia.

## 2017-10-07 NOTE — Telephone Encounter (Signed)
New Message:      [facility] calling with critical labs for pt

## 2017-10-07 NOTE — Progress Notes (Signed)
Cardiology Office Note Date:  10/07/2017   ID:  Kristina Summers, DOB [birth year], MRN [id number]  PCP:  Rogers Blocker, MD  Cardiologist:  Tollie Eth, MD  Chief Complaint  Patient presents with  . Shortness of Breath     History of Present Illness: Kristina Summers is a 82 y.o. female who presents for evaluation of aortic stenosis.   The patient is here alone today. She has a history of inferior MI in 1992 and ultimately was treated with multivessel CABG in 2000 by Dr Cyndia Bent. In 2016 the patient had a stroke and has residual expressive aphasia. She ambulates with a walker, but remains functionally independent. She has been anticoagulated with apixaban for secondary stroke prevention since then. She previously had GI bleeding on warfarin and this had been discontinued. The patient also has significant PAD and has undergone several revascularization procedures including right common iliac stenting and orbital atherectomy/PTA procedures on the left SFA. She ambulates with a walker and has fairly advanced vision loss. Despite all of her problems, she remains independent. She lives in an apartment just next to [place] is able to do her own shopping.   She is short of breath with walking, especially if walking up a grade. Last month she was hospitalized with acute on chronic diastolic heart failure and was treated with IV diuresis. This was her second hospitalization for heart failure in the past year (October 2018). Since discharge she has felt better on her current Rx.  She denies chest pain or pressure.  She denies lightheadedness or syncope.  She has also been limited by her right foot ulcer and has been undergoing treatment at the wound center.  Her right leg is wrapped today.  She states that the ulcer is improving.  She is been followed closely by Dr. Bridgett Larsson with vascular surgery.  The patient is originally from [place].  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  She is lived in [place] now for many years.  Her son and 2 daughters live locally.  Past Medical History:  Diagnosis Date  . Angina   . Arthritis   . Atrial fibrillation ([place])   . CAD (coronary artery disease)    a. s/p CABG in 2000  . Carotid artery occlusion   . Diabetes mellitus   . Diastolic CHF ([place]) 44/0102  . Fibromyalgia   . GERD (gastroesophageal reflux disease)   . GI bleed 1/11  . Hyperlipidemia   . Hypertension   . Myocardial infarction ([place]) ~ 1991  . Normal nuclear stress test 03/18/2011  . Obesity   . Peripheral vascular disease ([place])   . Stroke [facility])    May 24, 2014  . UTI (lower urinary tract infection) Sept. 2015    Past Surgical History:  Procedure Laterality Date  . ABDOMINAL AORTAGRAM N/A 04/01/2011   Procedure: ABDOMINAL AORTAGRAM;  Surgeon: Conrad Browning, MD;  Location: [facility] CATH LAB;  Service: Cardiovascular;  Laterality: N/A;  . ANGIOPLASTY  06/17/11   Left leg common femoral artery cannulation under u/s Left leg runoff  . CATARACT EXTRACTION W/ INTRAOCULAR LENS  IMPLANT, BILATERAL  2004-2005  . CORONARY ARTERY BYPASS GRAFT  2000   CABG X5  . EYE SURGERY    . LOWER EXTREMITY ANGIOGRAM Bilateral 04/01/2011   Procedure: LOWER EXTREMITY ANGIOGRAM;  Surgeon: Conrad Dupont, MD;  Location[facility] CATH LAB;  Service: Cardiovascular;  Laterality:  Bilateral;  . LOWER EXTREMITY ANGIOGRAM Left 06/17/2011   Procedure: LOWER EXTREMITY ANGIOGRAM;  Surgeon: Conrad Allport, MD;  Location: [facility] CATH LAB;  Service: Cardiovascular;  Laterality: Left;  . LOWER EXTREMITY ANGIOGRAM N/A 11/18/2011   Procedure: LOWER EXTREMITY ANGIOGRAM;  Surgeon: Conrad Hackneyville, MD;  Location: [facility] CATH LAB;  Service: Cardiovascular;  Laterality: N/A;  . PERCUTANEOUS STENT INTERVENTION Right 04/01/2011   Procedure: PERCUTANEOUS STENT INTERVENTION;  Surgeon: Conrad Kilauea, MD;  Location: [facility] CATH LAB;  Service: Cardiovascular;  Laterality: Right;  rt iliac stent  .  PERIPHERAL ARTERIAL STENT GRAFT  04/01/11   right common iliac  . TRIGGER FINGER RELEASE  1996   right thumb    Current Outpatient Medications  Medication Sig Dispense Refill  . acetaminophen (TYLENOL) 325 MG tablet Take 650 mg by mouth every 6 (six) hours as needed for headache.    Marland Kitchen amLODipine (NORVASC) 10 MG tablet Take 10 mg by mouth daily.    Marland Kitchen apixaban (ELIQUIS) 5 MG TABS tablet Take 1 tablet (5 mg total) by mouth 2 (two) times daily. 60 tablet 0  . atorvastatin (LIPITOR) 80 MG tablet Take 1 tablet (80 mg total) by mouth daily. (Patient taking differently: Take 80 mg by mouth at bedtime. ) 30 tablet 0  . furosemide (LASIX) 40 MG tablet Take 40 mg by mouth 2 (two) times daily.  0  . glucose 4 GM chewable tablet Chew 4 tablets by mouth once.    Marland Kitchen HUMULIN R 100 UNIT/ML injection Inject 5 units subcutaneously BEFORE BREAKFAST AND DINNER  0  . insulin NPH Human (HUMULIN N,NOVOLIN N) 100 UNIT/ML injection Inject 0.38 mLs (38 Units total) into the skin 2 (two) times daily. 38 in am and 34 at night (Patient taking differently: Inject 26-57 Units into the skin See admin instructions. Use 37 units every morning and use 22 units every evening) 10 mL 11  . losartan (COZAAR) 50 MG tablet Take 1 tablet (50 mg total) by mouth daily. (Patient taking differently: Take 50 mg by mouth 2 (two) times daily. )    . metoprolol tartrate (LOPRESSOR) 25 MG tablet Take 25 mg by mouth 2 (two) times daily.     . Multiple Vitamins-Minerals (ONE-A-DAY 50 PLUS PO) Take 1 tablet by mouth daily.    . mupirocin ointment (BACTROBAN) 2 % Apply daily to right foot ulceration 30 g 0  . mupirocin ointment (BACTROBAN) 2 % Apply to affected area daily. 22 g 2  . NITROSTAT 0.4 MG SL tablet Place 1 tablet under the tongue every 5 (five) minutes x 3 doses as needed for chest pain. Chest pain  1   No current facility-administered medications for this visit.     Allergies:   Morphine and related; Lisinopril; Morphine; Sertraline  hcl; and Zoloft [sertraline hcl]   Social History:  The patient  reports that she quit smoking about 27 years ago. Her smoking use included cigarettes. She has a 60.00 pack-year smoking history. She has never used smokeless tobacco. She reports that she does not drink alcohol or use drugs.   Family History:  The patient's family history includes Diabetes in her brother; Other in her brother.    ROS:  Please see the history of present illness.   All other systems are reviewed and negative.    PHYSICAL EXAM: VS:  BP (!) 128/40   Pulse 60   Ht 5\' 6"  (1.676 m)   Wt 202 lb (91.6 kg)   SpO2 98%  BMI 32.60 kg/m  , BMI Body mass index is 32.6 kg/m. GEN: Pleasant elderly woman, in no acute distress  HEENT: normal  Neck: no JVD, no masses. bilateral carotid bruits Cardiac: irregular with grade 3/6 harsh mid-late peaking systolic murmur at the RUSB, A2 audible            Respiratory:  clear to auscultation bilaterally, normal work of breathing GI: soft, nontender, nondistended, + BS MS: no deformity or atrophy  Ext: 1+ pretibial edema Skin: distal leg erythema noted Neuro:  Strength and sensation are intact Psych: euthymic mood, full affect  EKG:  EKG is not ordered today.  Recent Labs: 06/13/2017: Magnesium 2.2 08/25/2017: ALT 27; B Natriuretic Peptide 432.3; Hemoglobin 11.0; Platelets 325 08/29/2017: BUN 20; Creatinine, Ser 1.19; Potassium 4.2; Sodium 142   Lipid Panel     Component Value Date/Time   CHOL 92 2020/02/515 1006   TRIG 165 (H) 2020/02/515 1006   HDL 11 (L) 2020/02/515 1006   CHOLHDL 8.4 2020/02/515 1006   VLDL 33 2020/02/515 1006   LDLCALC 48 2020/02/515 1006      Wt Readings from Last 3 Encounters:  10/07/17 202 lb (91.6 kg)  08/29/17 206 lb 6.4 oz (93.6 kg)  07/08/17 212 lb (96.2 kg)     Cardiac Studies Reviewed: Echo: Impressions:  - Compared to a prior study in 01/2017, the LVEF is unchanged.   There is now likely severe aortic stenosis with an AVA around  0.9   cm2, there is severe LAE and diastolic dysfunction with elevated   LV filling pressure. Left ventricle:  The cavity size was normal. Wall thickness was increased in a pattern of mild LVH. Systolic function was normal. The estimated ejection fraction was in the range of 50% to 55%. Suspected diastolic dysfunction and elevated LV filling pressure (a-fib is noted).  ------------------------------------------------------------------- Aortic valve:  Calcified with restricted leaflet motion. There is likely severe stenosis. Trivial regurgitation.  Doppler:     VTI ratio of LVOT to aortic valve: 0.29. Valve area (VTI): 0.9 cm^2. Indexed valve area (VTI): 0.42 cm^2/m^2. Peak velocity ratio of LVOT to aortic valve: 0.26. Valve area (Vmax): 0.81 cm^2. Indexed valve area (Vmax): 0.38 cm^2/m^2. Mean velocity ratio of LVOT to aortic valve: 0.26. Valve area (Vmean): 0.83 cm^2. Indexed valve area (Vmean): 0.39 cm^2/m^2.    Mean gradient (S): 21 mm Hg. Peak gradient (S): 34 mm Hg.  ------------------------------------------------------------------- Aorta:  Aortic root: The aortic root was normal in size. Ascending aorta: The ascending aorta was normal in size.  ------------------------------------------------------------------- Mitral valve:   Calcified annulus. Mildly thickened leaflets . Doppler:  There was trivial regurgitation.    Peak gradient (D): 6 mm Hg.  ------------------------------------------------------------------- Left atrium:  Severely dilated.  ------------------------------------------------------------------- Atrial septum:  No defect or patent foramen ovale was identified.   ------------------------------------------------------------------- Right ventricle:  The cavity size was mildly dilated. Moderately reduced systolic function.  ------------------------------------------------------------------- Pulmonic valve:   Poorly visualized.  Doppler:  There was  no significant regurgitation.  ------------------------------------------------------------------- Tricuspid valve:   Doppler:  There was no significant regurgitation.  ------------------------------------------------------------------- Pulmonary artery:   Poorly visualized.  ------------------------------------------------------------------- Right atrium:  The atrium was mildly dilated.  ------------------------------------------------------------------- Pericardium:  There was no pericardial effusion.  ------------------------------------------------------------------- Systemic veins: Inferior vena cava: The vessel was dilated. The respirophasic diameter changes were blunted (< 50%), consistent with elevated central venous pressure.  ------------------------------------------------------------------- Measurements   Left ventricle  Value          Reference  LV ID, ED, PLAX chordal          (H)     55    mm       43 - 52  LV ID, ES, PLAX chordal          (H)     41    mm       23 - 38  LV fx shortening, PLAX chordal   (L)     25    %        >=29  LV PW thickness, ED                      13    mm       ----------  IVS/LV PW ratio, ED                      0.85           <=1.3  Stroke volume, 2D                        61    ml       ----------  Stroke volume/bsa, 2D                    29    ml/m^2   ----------    Ventricular septum                       Value          Reference  IVS thickness, ED                        11    mm       ----------    LVOT                                     Value          Reference  LVOT ID, S                               20    mm       ----------  LVOT area                                3.14  cm^2     ----------  LVOT peak velocity, S                    75.2  cm/s     ----------  LVOT mean velocity, S                    56    cm/s     ----------  LVOT VTI, S                              19.3  cm        ----------  Stroke volume (SV), LVOT DP              60.6  ml       ----------  Stroke index (SV/bsa), LVOT DP           28.4  ml/m^2   ----------    Aortic valve                             Value          Reference  Aortic valve peak velocity, S            292   cm/s     ----------  Aortic valve mean velocity, S            213   cm/s     ----------  Aortic valve VTI, S                      67    cm       ----------  Aortic mean gradient, S                  21    mm Hg    ----------  Aortic peak gradient, S                  34    mm Hg    ----------  VTI ratio, LVOT/AV                       0.29           ----------  Aortic valve area, VTI                   0.9   cm^2     ----------  Aortic valve area/bsa, VTI               0.42  cm^2/m^2 ----------  Velocity ratio, peak, LVOT/AV            0.26           ----------  Aortic valve area, peak velocity         0.81  cm^2     ----------  Aortic valve area/bsa, peak              0.38  cm^2/m^2 ----------  velocity  Velocity ratio, mean, LVOT/AV            0.26           ----------  Aortic valve area, mean velocity         0.83  cm^2     ----------  Aortic valve area/bsa, mean              0.39  cm^2/m^2 ----------  velocity    Aorta                                    Value          Reference  Aortic root ID, ED                       32    mm       ----------    Left atrium                              Value          Reference  LA ID, A-P, ES  45    mm       ----------  LA ID/bsa, A-P                           2.11  cm/m^2   <=2.2  LA volume, S                             103   ml       ----------  LA volume/bsa, S                         48.2  ml/m^2   ----------  LA volume, ES, 1-p A4C                   121   ml       ----------  LA volume/bsa, ES, 1-p A4C               56.6  ml/m^2   ----------  LA volume, ES, 1-p A2C                   85.5  ml       ----------  LA volume/bsa, ES, 1-p A2C               40     ml/m^2   ----------    Mitral valve                             Value          Reference  Mitral E-wave peak velocity              126   cm/s     ----------  Mitral deceleration time                 162   ms       150 - 230  Mitral peak gradient, D                  6     mm Hg    ----------    Right atrium                             Value          Reference  RA ID, S-I, ES, A4C              (H)     66.8  mm       34 - 49  RA area, ES, A4C                 (H)     20.9  cm^2     8.3 - 19.5  RA volume, ES, A/L                       55.2  ml       ----------  RA volume/bsa, ES, A/L                   25.8  ml/m^2   ----------    Right ventricle                          Value  Reference  RV ID, minor axis, ED, A4C base          35    mm       ----------  RV ID, minor axis, ED, A4C mid           34    mm       ----------  RV ID, major axis, ED, A4C               63    mm       55 - 91  TAPSE                                    11.5  mm       ----------  RV s&', lateral, S                        5.98  cm/s     ----------  STS Calculator Risk of Mortality:  7.259%  Renal Failure:  6.124%  Permanent Stroke:  5.364%  Prolonged Ventilation:  22.478%  DSW Infection:  0.517%  Reoperation:  2.937%  Morbidity or Mortality:  28.153%  Short Length of Stay:  8.871%  Long Length of Stay:  24.274%    ASSESSMENT AND PLAN: 82 yo woman with recent hospitalization for acute on chronic diastolic heart failure in the setting of what appears to be progressive, stage D1 aortic stenosis.  I have reviewed the natural history of aortic stenosis with the patient today. We have discussed the limitations of medical therapy and the poor prognosis associated with symptomatic aortic stenosis. We have reviewed potential treatment options, including palliative medical therapy, conventional surgical aortic valve replacement, and transcatheter aortic valve replacement. We discussed treatment options in the context  of the patient's specific comorbid medical conditions.   I have personally reviewed the patient's recent echo images.  Her LV function appears to be relatively well preserved.  While peak and mean transaortic valve gradients are in the moderate range, the aortic valve is severely calcified and restricted.  The valve appears trileaflet but there is very little leaflet mobility.  The aortic valve dimensionless index is 0.25.  With 2 heart failure hospitalizations over the past year, continued dyspnea, and echo findings described above, I am highly suspicious the patient has severe symptomatic aortic stenosis.  I have recommended right and left heart catheterization for further evaluation.  Will attempt a left radial approach considering her lower extremity PAD and history of coronary bypass grafting.  I have reviewed the risks, indications, and alternatives to cardiac catheterization, possible angioplasty, and stenting with the patient. Risks include but are not limited to bleeding, infection, vascular injury, stroke, myocardial infection, arrhythmia, kidney injury, radiation-related injury in the case of prolonged fluoroscopy use, emergency cardiac surgery, and death. The patient understands the risks of serious complication is 1-2 in 1610 with diagnostic cardiac cath and 1-2% or less with angioplasty/stenting.   Considering her advanced age, significant functional limitation, and multiple comorbid medical conditions outlined above with previous stroke, diabetes, peripheral arterial disease, and previous cardiac surgery, TAVR would clearly be the treatment of choice for this patient if she is found to have severe aortic stenosis and if she has appropriate anatomy for this treatment. Pending her cardiac catheterization results, will tentatively plan to proceed with CTA studies and formal cardiac surgical consultation with Dr Cyndia Bent who performed her  CABG in 2000.   Current medicines are reviewed with the patient  today.  The patient does not have concerns regarding medicines.  Labs/ tests ordered today include:   Orders Placed This Encounter  Procedures  . Basic metabolic panel  . CBC    Disposition:   As above  Signed, Sherren Mocha, MD  10/07/2017 12:45 PM    [address], [place], [place]  [address] Phone: [phone]; Fax: [phone]

## 2017-10-10 ENCOUNTER — Other Ambulatory Visit: Payer: Self-pay

## 2017-10-10 ENCOUNTER — Encounter: Payer: Self-pay | Admitting: Surgery

## 2017-10-10 ENCOUNTER — Telehealth: Payer: Self-pay | Admitting: *Deleted

## 2017-10-10 DIAGNOSIS — I350 Nonrheumatic aortic (valve) stenosis: Secondary | ICD-10-CM

## 2017-10-10 DIAGNOSIS — R0602 Shortness of breath: Secondary | ICD-10-CM

## 2017-10-10 NOTE — Telephone Encounter (Addendum)
Pt contacted pre-catheterization scheduled at [facility] for: Tuesday October 11, 2017 1:30 PM Verified arrival time and place: [place] Entrance A at: 11:30 AM  No solid food after midnight prior to cath, clear liquids until 5 AM day of procedure. Verified allergies in Epic  Hold: Eliquis 10/10/17 until post procedure. Insulin 10/11/17. 1/2 Insulin PM 10/10/17. Furosemide -PM prior and AM of procedure Losartan PM prior and AM of procedure.   Except hold medications AM meds can be  taken pre-cath with sip of water including:  ASA 81 mg   Confirmed patient has responsible person to drive home post procedure and observe patient for 24 hours: yes  Note: 10/07/17 GFR 42-reviewed with Dr Burt Knack-- Dr Burt Knack did not want to pt to arrive early for pre-procedure hydration, usual weight based protocol on arrival -hold lasix and losartan PM prior to and AM of procedure.  10/07/17 Glucose -28-Dr Burt Knack aware, advised pt follow-up with her PCP, arrange consult for pt with Diabetes Coordinator tomorrow when she is in Short Stay for procedure. Pt states her glucose has been under poor control for the past month, she has contacted her PCP today and left a message for a call back.  10/07/17 HGB 9.8-Dr Burt Knack aware.

## 2017-10-11 ENCOUNTER — Ambulatory Visit (HOSPITAL_COMMUNITY)
Admission: RE | Disposition: A | Discharge status: Home / Self Care | Payer: Self-pay | Source: Ambulatory Visit | Attending: Cardiovascular Disease

## 2017-10-11 ENCOUNTER — Observation Stay (HOSPITAL_COMMUNITY)
Admission: RE | Admit: 2017-10-11 | Discharge: 2017-10-12 | Disposition: A | Discharge status: Home / Self Care | Payer: Medicare Other | Source: Ambulatory Visit | Attending: Cardiovascular Disease | Admitting: Cardiovascular Disease

## 2017-10-11 ENCOUNTER — Encounter (HOSPITAL_COMMUNITY): Payer: Self-pay | Admitting: General Practice

## 2017-10-11 ENCOUNTER — Encounter: Payer: Self-pay | Admitting: Physician Assistant

## 2017-10-11 ENCOUNTER — Other Ambulatory Visit: Payer: Self-pay

## 2017-10-11 DIAGNOSIS — E669 Obesity, unspecified: Secondary | ICD-10-CM | POA: Insufficient documentation

## 2017-10-11 DIAGNOSIS — Z6832 Body mass index (BMI) 32.0-32.9, adult: Secondary | ICD-10-CM | POA: Insufficient documentation

## 2017-10-11 DIAGNOSIS — E785 Hyperlipidemia, unspecified: Secondary | ICD-10-CM | POA: Diagnosis present

## 2017-10-11 DIAGNOSIS — Z87891 Personal history of nicotine dependence: Secondary | ICD-10-CM | POA: Diagnosis not present

## 2017-10-11 DIAGNOSIS — M1990 Unspecified osteoarthritis, unspecified site: Secondary | ICD-10-CM | POA: Insufficient documentation

## 2017-10-11 DIAGNOSIS — I2510 Atherosclerotic heart disease of native coronary artery without angina pectoris: Principal | ICD-10-CM | POA: Diagnosis present

## 2017-10-11 DIAGNOSIS — Z87440 Personal history of urinary (tract) infections: Secondary | ICD-10-CM | POA: Insufficient documentation

## 2017-10-11 DIAGNOSIS — I70219 Atherosclerosis of native arteries of extremities with intermittent claudication, unspecified extremity: Secondary | ICD-10-CM | POA: Diagnosis present

## 2017-10-11 DIAGNOSIS — M797 Fibromyalgia: Secondary | ICD-10-CM | POA: Insufficient documentation

## 2017-10-11 DIAGNOSIS — Z794 Long term (current) use of insulin: Secondary | ICD-10-CM | POA: Insufficient documentation

## 2017-10-11 DIAGNOSIS — K219 Gastro-esophageal reflux disease without esophagitis: Secondary | ICD-10-CM | POA: Diagnosis not present

## 2017-10-11 DIAGNOSIS — I130 Hypertensive heart and chronic kidney disease with heart failure and stage 1 through stage 4 chronic kidney disease, or unspecified chronic kidney disease: Secondary | ICD-10-CM | POA: Diagnosis not present

## 2017-10-11 DIAGNOSIS — Z7901 Long term (current) use of anticoagulants: Secondary | ICD-10-CM | POA: Diagnosis not present

## 2017-10-11 DIAGNOSIS — I6529 Occlusion and stenosis of unspecified carotid artery: Secondary | ICD-10-CM | POA: Diagnosis not present

## 2017-10-11 DIAGNOSIS — I350 Nonrheumatic aortic (valve) stenosis: Secondary | ICD-10-CM | POA: Diagnosis not present

## 2017-10-11 DIAGNOSIS — Z885 Allergy status to narcotic agent status: Secondary | ICD-10-CM | POA: Insufficient documentation

## 2017-10-11 DIAGNOSIS — I5032 Chronic diastolic (congestive) heart failure: Secondary | ICD-10-CM | POA: Diagnosis present

## 2017-10-11 DIAGNOSIS — Z888 Allergy status to other drugs, medicaments and biological substances status: Secondary | ICD-10-CM | POA: Diagnosis not present

## 2017-10-11 DIAGNOSIS — E11621 Type 2 diabetes mellitus with foot ulcer: Secondary | ICD-10-CM | POA: Insufficient documentation

## 2017-10-11 DIAGNOSIS — E1151 Type 2 diabetes mellitus with diabetic peripheral angiopathy without gangrene: Secondary | ICD-10-CM | POA: Diagnosis present

## 2017-10-11 DIAGNOSIS — I5033 Acute on chronic diastolic (congestive) heart failure: Secondary | ICD-10-CM | POA: Insufficient documentation

## 2017-10-11 DIAGNOSIS — I252 Old myocardial infarction: Secondary | ICD-10-CM | POA: Diagnosis not present

## 2017-10-11 DIAGNOSIS — Z951 Presence of aortocoronary bypass graft: Secondary | ICD-10-CM | POA: Diagnosis not present

## 2017-10-11 DIAGNOSIS — L97519 Non-pressure chronic ulcer of other part of right foot with unspecified severity: Secondary | ICD-10-CM | POA: Insufficient documentation

## 2017-10-11 DIAGNOSIS — I10 Essential (primary) hypertension: Secondary | ICD-10-CM | POA: Diagnosis present

## 2017-10-11 DIAGNOSIS — E1122 Type 2 diabetes mellitus with diabetic chronic kidney disease: Secondary | ICD-10-CM | POA: Diagnosis not present

## 2017-10-11 DIAGNOSIS — Z9841 Cataract extraction status, right eye: Secondary | ICD-10-CM | POA: Insufficient documentation

## 2017-10-11 DIAGNOSIS — I482 Chronic atrial fibrillation, unspecified: Secondary | ICD-10-CM | POA: Diagnosis present

## 2017-10-11 DIAGNOSIS — I2582 Chronic total occlusion of coronary artery: Secondary | ICD-10-CM | POA: Insufficient documentation

## 2017-10-11 DIAGNOSIS — N189 Chronic kidney disease, unspecified: Secondary | ICD-10-CM | POA: Diagnosis not present

## 2017-10-11 DIAGNOSIS — Z79899 Other long term (current) drug therapy: Secondary | ICD-10-CM | POA: Insufficient documentation

## 2017-10-11 DIAGNOSIS — Z955 Presence of coronary angioplasty implant and graft: Secondary | ICD-10-CM | POA: Insufficient documentation

## 2017-10-11 DIAGNOSIS — Z9842 Cataract extraction status, left eye: Secondary | ICD-10-CM | POA: Insufficient documentation

## 2017-10-11 DIAGNOSIS — I639 Cerebral infarction, unspecified: Secondary | ICD-10-CM | POA: Diagnosis present

## 2017-10-11 DIAGNOSIS — Z9889 Other specified postprocedural states: Secondary | ICD-10-CM | POA: Insufficient documentation

## 2017-10-11 DIAGNOSIS — Z8719 Personal history of other diseases of the digestive system: Secondary | ICD-10-CM

## 2017-10-11 HISTORY — PX: CARDIAC CATHETERIZATION: SHX172

## 2017-10-11 HISTORY — DX: Personal history of other diseases of the digestive system: Z87.19

## 2017-10-11 HISTORY — DX: Type 2 diabetes mellitus without complications: E11.9

## 2017-10-11 HISTORY — DX: Anemia, unspecified: D64.9

## 2017-10-11 HISTORY — DX: Chronic kidney disease, unspecified: N18.9

## 2017-10-11 HISTORY — PX: RIGHT/LEFT HEART CATH AND CORONARY/GRAFT ANGIOGRAPHY: CATH118267

## 2017-10-11 HISTORY — DX: Paroxysmal atrial fibrillation: I48.0

## 2017-10-11 LAB — POCT I-STAT 3, ART BLOOD GAS (G3+)
Acid-base deficit: 1 mmol/L (ref 0.0–2.0)
Bicarbonate: 23.6 mmol/L (ref 20.0–28.0)
O2 SAT: 98 %
PCO2 ART: 39.3 mmHg (ref 32.0–48.0)
PH ART: 7.386 (ref 7.350–7.450)
PO2 ART: 103 mmHg (ref 83.0–108.0)
TCO2: 25 mmol/L (ref 22–32)

## 2017-10-11 LAB — POCT I-STAT 3, VENOUS BLOOD GAS (G3P V)
ACID-BASE DEFICIT: 1 mmol/L (ref 0.0–2.0)
Bicarbonate: 24.6 mmol/L (ref 20.0–28.0)
O2 SAT: 69 %
PO2 VEN: 39 mmHg (ref 32.0–45.0)
TCO2: 26 mmol/L (ref 22–32)
pCO2, Ven: 45.3 mmHg (ref 44.0–60.0)
pH, Ven: 7.343 (ref 7.250–7.430)

## 2017-10-11 LAB — GLUCOSE, CAPILLARY
GLUCOSE-CAPILLARY: 147 mg/dL — AB (ref 70–99)
GLUCOSE-CAPILLARY: 50 mg/dL — AB (ref 70–99)
GLUCOSE-CAPILLARY: 104 mg/dL — AB (ref 70–99)
GLUCOSE-CAPILLARY: 185 mg/dL — AB (ref 70–99)
Glucose-Capillary: 61 mg/dL — ABNORMAL LOW (ref 70–99)
Glucose-Capillary: 104 mg/dL — ABNORMAL HIGH (ref 70–99)
Glucose-Capillary: 61 mg/dL — ABNORMAL LOW (ref 70–99)

## 2017-10-11 SURGERY — RIGHT/LEFT HEART CATH AND CORONARY/GRAFT ANGIOGRAPHY
Anesthesia: LOCAL

## 2017-10-11 MED ORDER — SODIUM CHLORIDE 0.9 % WEIGHT BASED INFUSION: 1.0000 mL/kg/h | INTRAVENOUS | Status: DC

## 2017-10-11 MED ORDER — METOPROLOL TARTRATE 25 MG PO TABS
25.0000 mg | ORAL_TABLET | Freq: Two times a day (BID) | ORAL | Status: DC
  Administered 2017-10-11 – 2017-10-12 (×2): 25 mg via ORAL
  Filled 2017-10-11 (×2): qty 1

## 2017-10-11 MED ORDER — NITROGLYCERIN 0.4 MG SL SUBL: 0.4000 mg | SUBLINGUAL_TABLET | SUBLINGUAL | Status: DC | PRN

## 2017-10-11 MED ORDER — LIDOCAINE HCL (PF) 1 % IJ SOLN
INTRAMUSCULAR | Status: AC
  Filled 2017-10-11: qty 30

## 2017-10-11 MED ORDER — HYDRALAZINE HCL 20 MG/ML IJ SOLN: 10.0000 mg | INTRAMUSCULAR | Status: DC | PRN

## 2017-10-11 MED ORDER — SODIUM CHLORIDE 0.9% FLUSH
3.0000 mL | Freq: Two times a day (BID) | INTRAVENOUS | Status: DC
  Administered 2017-10-12: 3 mL via INTRAVENOUS

## 2017-10-11 MED ORDER — FENTANYL CITRATE (PF) 100 MCG/2ML IJ SOLN
INTRAMUSCULAR | Status: AC
  Filled 2017-10-11: qty 2

## 2017-10-11 MED ORDER — FUROSEMIDE 40 MG PO TABS
40.0000 mg | ORAL_TABLET | Freq: Two times a day (BID) | ORAL | Status: DC
  Administered 2017-10-12: 40 mg via ORAL
  Filled 2017-10-11: qty 1

## 2017-10-11 MED ORDER — SODIUM CHLORIDE 0.9 % IV SOLN: 250.0000 mL | INTRAVENOUS | Status: DC | PRN

## 2017-10-11 MED ORDER — ATORVASTATIN CALCIUM 80 MG PO TABS
80.0000 mg | ORAL_TABLET | Freq: Every day | ORAL | Status: DC
Start: 2017-10-11 — End: 2017-10-12
  Administered 2017-10-11: 21:00:00 80 mg via ORAL
  Filled 2017-10-11: qty 1

## 2017-10-11 MED ORDER — APIXABAN 5 MG PO TABS
5.0000 mg | ORAL_TABLET | Freq: Two times a day (BID) | ORAL | Status: DC
  Administered 2017-10-12: 10:00:00 5 mg via ORAL
  Filled 2017-10-11: qty 1

## 2017-10-11 MED ORDER — GLUCOSE 4 G PO CHEW
4.0000 | CHEWABLE_TABLET | Freq: Once | ORAL | Status: DC | PRN
  Filled 2017-10-11: qty 4

## 2017-10-11 MED ORDER — SODIUM CHLORIDE 0.9 % IV SOLN: INTRAVENOUS | Status: AC

## 2017-10-11 MED ORDER — MIDAZOLAM HCL 2 MG/2ML IJ SOLN
INTRAMUSCULAR | Status: DC | PRN
  Administered 2017-10-11: 1 mg via INTRAVENOUS

## 2017-10-11 MED ORDER — ASPIRIN 81 MG PO CHEW: 81.0000 mg | CHEWABLE_TABLET | ORAL | Status: DC

## 2017-10-11 MED ORDER — DEXTROSE 50 % IV SOLN
INTRAVENOUS | Status: AC
  Administered 2017-10-11: 25 mL via INTRAVENOUS
  Filled 2017-10-11: qty 50

## 2017-10-11 MED ORDER — ACETAMINOPHEN 325 MG PO TABS: 650.0000 mg | ORAL_TABLET | Freq: Four times a day (QID) | ORAL | Status: DC | PRN

## 2017-10-11 MED ORDER — IOPAMIDOL (ISOVUE-370) INJECTION 76%
INTRAVENOUS | Status: DC | PRN
  Administered 2017-10-11: 80 mL

## 2017-10-11 MED ORDER — ONDANSETRON HCL 4 MG/2ML IJ SOLN: 4.0000 mg | Freq: Four times a day (QID) | INTRAMUSCULAR | Status: DC | PRN

## 2017-10-11 MED ORDER — SODIUM CHLORIDE 0.9% FLUSH: 3.0000 mL | INTRAVENOUS | Status: DC | PRN

## 2017-10-11 MED ORDER — FENTANYL CITRATE (PF) 100 MCG/2ML IJ SOLN
INTRAMUSCULAR | Status: DC | PRN
  Administered 2017-10-11: 25 ug via INTRAVENOUS

## 2017-10-11 MED ORDER — HEPARIN (PORCINE) IN NACL 1000-0.9 UT/500ML-% IV SOLN
INTRAVENOUS | Status: AC
  Filled 2017-10-11: qty 1000

## 2017-10-11 MED ORDER — ACETAMINOPHEN 325 MG PO TABS: 650.0000 mg | ORAL_TABLET | ORAL | Status: DC | PRN

## 2017-10-11 MED ORDER — DEXTROSE 50 % IV SOLN
25.0000 mL | Freq: Once | INTRAVENOUS | Status: AC
  Administered 2017-10-11 (×2): 25 mL via INTRAVENOUS

## 2017-10-11 MED ORDER — LOSARTAN POTASSIUM 50 MG PO TABS
50.0000 mg | ORAL_TABLET | Freq: Two times a day (BID) | ORAL | Status: DC
  Administered 2017-10-11 – 2017-10-12 (×2): 50 mg via ORAL
  Filled 2017-10-11 (×2): qty 1

## 2017-10-11 MED ORDER — HEPARIN (PORCINE) IN NACL 2-0.9 UNITS/ML
INTRAMUSCULAR | Status: AC | PRN
  Administered 2017-10-11 (×2): 500 mL

## 2017-10-11 MED ORDER — SODIUM CHLORIDE 0.9% FLUSH: 3.0000 mL | Freq: Two times a day (BID) | INTRAVENOUS | Status: DC

## 2017-10-11 MED ORDER — LIDOCAINE HCL (PF) 1 % IJ SOLN
INTRAMUSCULAR | Status: DC | PRN
  Administered 2017-10-11: 17 mL
  Administered 2017-10-11 (×2): 2 mL

## 2017-10-11 MED ORDER — SODIUM CHLORIDE 0.9 % WEIGHT BASED INFUSION
3.0000 mL/kg/h | INTRAVENOUS | Status: DC
  Administered 2017-10-11: 3 mL/kg/h via INTRAVENOUS

## 2017-10-11 MED ORDER — INSULIN ASPART 100 UNIT/ML ~~LOC~~ SOLN
0.0000 [IU] | Freq: Three times a day (TID) | SUBCUTANEOUS | Status: DC
  Administered 2017-10-12 (×2): 3 [IU] via SUBCUTANEOUS

## 2017-10-11 MED ORDER — AMLODIPINE BESYLATE 10 MG PO TABS
10.0000 mg | ORAL_TABLET | Freq: Every evening | ORAL | Status: DC
  Filled 2017-10-11: qty 1

## 2017-10-11 MED ORDER — MIDAZOLAM HCL 2 MG/2ML IJ SOLN
INTRAMUSCULAR | Status: AC
  Filled 2017-10-11: qty 2

## 2017-10-11 MED ORDER — VERAPAMIL HCL 2.5 MG/ML IV SOLN
INTRAVENOUS | Status: AC
  Filled 2017-10-11: qty 2

## 2017-10-11 MED ORDER — IOPAMIDOL (ISOVUE-370) INJECTION 76%
INTRAVENOUS | Status: AC
  Filled 2017-10-11: qty 125

## 2017-10-11 SURGICAL SUPPLY — 17 items
CATH BALLN WEDGE 5F 110CM (CATHETERS) ×1 IMPLANT
CATH INFINITI 5FR AL1 (CATHETERS) ×1 IMPLANT
CATH INFINITI 5FR MULTPACK ANG (CATHETERS) ×1 IMPLANT
COVER PRB 48X5XTLSCP FOLD TPE (BAG) IMPLANT
COVER PROBE 5X48 (BAG) ×2
DEVICE RAD COMP TR BAND LRG (VASCULAR PRODUCTS) ×1 IMPLANT
GLIDESHEATH SLEND SS 6F .021 (SHEATH) ×1 IMPLANT
GUIDEWIRE INQWIRE 1.5J.035X260 (WIRE) IMPLANT
INQWIRE 1.5J .035X260CM (WIRE) ×2
KIT HEART LEFT (KITS) ×2 IMPLANT
PACK CARDIAC CATHETERIZATION (CUSTOM PROCEDURE TRAY) ×2 IMPLANT
SHEATH GLIDE SLENDER 4/5FR (SHEATH) ×1 IMPLANT
SHEATH PINNACLE 5F 10CM (SHEATH) ×1 IMPLANT
TRANSDUCER W/STOPCOCK (MISCELLANEOUS) ×2 IMPLANT
TUBING CIL FLEX 10 FLL-RA (TUBING) ×2 IMPLANT
WIRE EMERALD 3MM-J .035X150CM (WIRE) ×1 IMPLANT
WIRE EMERALD ST .035X150CM (WIRE) ×1 IMPLANT

## 2017-10-11 NOTE — Progress Notes (Signed)
Pt states her daughter works nights and will not be able to stay with her 24 hours. Dr Burt Knack called and informed states ok to be admitted.

## 2017-10-11 NOTE — Interval H&P Note (Signed)
History and Physical Interval Note:  10/11/2017 2:41 PM  Kristina Summers  has presented today for surgery, with the diagnosis of as  The various methods of treatment have been discussed with the patient and family. After consideration of risks, benefits and other options for treatment, the patient has consented to  Procedure(s): RIGHT/LEFT HEART CATH AND CORONARY/GRAFT ANGIOGRAPHY (N/A) as a surgical intervention .  The patient's history has been reviewed, patient examined, no change in status, stable for surgery.  I have reviewed the patient's chart and labs.  Questions were answered to the patient's satisfaction.     Sherren Mocha

## 2017-10-11 NOTE — Progress Notes (Signed)
Patient arrived in holding area for sheath removal. CBG 50,  Half amp of 50 give per Dr. Burt Knack, followed by 1 cup of apple juice.   6Fr sheath aspirated and removed from rfa, manual pressure applied for 20 minutes. Groin level 0. Tegaderm dressing applied, bedrest instructions given.   Bilateral dp and pt pulses present with doppler.  Follow up CBG 104.  Bedrest begins at 16:50:00

## 2017-10-12 ENCOUNTER — Encounter (HOSPITAL_COMMUNITY): Payer: Medicare Other

## 2017-10-12 ENCOUNTER — Ambulatory Visit: Payer: Medicare Other | Admitting: Vascular Surgery

## 2017-10-12 ENCOUNTER — Other Ambulatory Visit: Payer: Self-pay

## 2017-10-12 ENCOUNTER — Encounter (HOSPITAL_COMMUNITY): Payer: Self-pay | Admitting: Cardiovascular Disease

## 2017-10-12 DIAGNOSIS — I350 Nonrheumatic aortic (valve) stenosis: Secondary | ICD-10-CM

## 2017-10-12 DIAGNOSIS — I2510 Atherosclerotic heart disease of native coronary artery without angina pectoris: Secondary | ICD-10-CM | POA: Diagnosis not present

## 2017-10-12 DIAGNOSIS — N289 Disorder of kidney and ureter, unspecified: Secondary | ICD-10-CM

## 2017-10-12 LAB — GLUCOSE, CAPILLARY
GLUCOSE-CAPILLARY: 183 mg/dL — AB (ref 70–99)
Glucose-Capillary: 173 mg/dL — ABNORMAL HIGH (ref 70–99)

## 2017-10-12 MED ORDER — INSULIN NPH (HUMAN) (ISOPHANE) 100 UNIT/ML ~~LOC~~ SUSP: 15.0000 [IU] | Freq: Two times a day (BID) | SUBCUTANEOUS | 11 refills | Status: DC

## 2017-10-12 MED FILL — Verapamil HCl IV Soln 2.5 MG/ML: INTRAVENOUS | Qty: 2 | Status: AC

## 2017-10-12 MED FILL — Heparin Sod (Porcine)-NaCl IV Soln 1000 Unit/500ML-0.9%: INTRAVENOUS | Qty: 1000 | Status: AC

## 2017-10-12 NOTE — Discharge Instructions (Signed)
Insulin recommendations: 15 units in the morning and 10 units in the PM Humulin R 5 units breakfast and dinner Check glucose 3-4 times/day  Groin Site Care Refer to this sheet in the next few weeks. These instructions provide you with information on caring for yourself after your procedure. Your caregiver may also give you more specific instructions. Your treatment has been planned according to current medical practices, but problems sometimes occur. Call your caregiver if you have any problems or questions after your procedure. HOME CARE INSTRUCTIONS  You may shower 24 hours after the procedure. Remove the bandage (dressing) and gently wash the site with plain soap and water. Gently pat the site dry.   Do not apply powder or lotion to the site.   Do not sit in a bathtub, swimming pool, or whirlpool for 5 to 7 days.   No bending, squatting, or lifting anything over 10 pounds (4.5 kg) as directed by your caregiver.   Inspect the site at least twice daily.   Do not drive home if you are discharged the same day of the procedure. Have someone else drive you.   You may drive 24 hours after the procedure unless otherwise instructed by your caregiver.  What to expect:  Any bruising will usually fade within 1 to 2 weeks.   Blood that collects in the tissue (hematoma) may be painful to the touch. It should usually decrease in size and tenderness within 1 to 2 weeks.  SEEK IMMEDIATE MEDICAL CARE IF:  You have unusual pain at the groin site or down the affected leg.   You have redness, warmth, swelling, or pain at the groin site.   You have drainage (other than a small amount of blood on the dressing).   You have chills.   You have a fever or persistent symptoms for more than 72 hours.   You have a fever and your symptoms suddenly get worse.   Your leg becomes pale, cool, tingly, or numb.  You have heavy bleeding from the site. Hold pressure on the site. Marland Kitchen

## 2017-10-12 NOTE — Care Management Note (Signed)
Case Management Note  Patient Details  Name: Mieka Leaton MRN: [id number] Date of Birth: 1935/10/04  Subjective/Objective:  From home alone, but has support from daughter, she is active with [place] [facility] for [facility], [place], [place], no new orders needed, she is under observation.  She has severe aortic stenosis, she is for eval for TAVR.                  Action/Plan: DC home when ready.  Expected Discharge Date:                  Expected Discharge Plan:  [place]  In-House Referral:     Discharge planning Services  CM Consult  Post Acute Care Choice:  Resumption of Svcs/PTA Provider Choice offered to:     DME Arranged:    DME Agency:     HH Arranged:  RN, PT, OT HH Agency:  [place]  Status of Service:  Completed, signed off  If discussed at [place] of Stay Meetings, dates discussed:    Additional Comments:  Zenon Mayo, RN 10/12/2017, 9:20 AM

## 2017-10-12 NOTE — Progress Notes (Addendum)
Inpatient Diabetes Program Recommendations  AACE/ADA: New Consensus Statement on Inpatient Glycemic Control (2015)  Target Ranges:  Prepandial:   less than 140 mg/dL      Peak postprandial:   less than 180 mg/dL (1-2 hours)      Critically ill patients:  140 - 180 mg/dL   Lab Results  Component Value Date   GLUCAP 173 (H) 10/12/2017   HGBA1C 6.5 (H) 06/11/2017   Review of Glycemic Control  Diabetes history: DM 2 Outpatient Diabetes medications: NPH 37 units qam, 22 units qpm, Humulin Regular 5 units breakfast and Dinner Current orders for Inpatient glycemic control: Novolog Sensitive 0-9 units tid  Inpatient Diabetes Program Recommendations:    A1c 6.5% on 2/23 indicating pt having lows at home.  Spoke to patient and daughter (who is a Marine scientist) at bedside. Patient having severe low glucose levels both during the day and at night in the 30's.   Patient has been working with her PCP office to decrease insulin levels to avoid hypoglycemia. Patient use to be on 37 units BID of NPH.  For discharge consider: Reduce NPH to 15 units qam, 10 units qpm Humulin R 5 units breakfast and dinner Check glucose 3-4 times/day Follow up within 1-2 weeks at PCP office  Thanks,  Tama Headings RN, MSN, BC-ADM, [facility] Inpatient Diabetes Coordinator Team Pager [phone] (8a-5p)

## 2017-10-12 NOTE — Discharge Summary (Addendum)
[place] VALVE TEAM   Discharge Summary    Patient ID: Kristina Summers,  MRN: [id number], DOB/AGE: 80-18-1937 82 y.o.  Admit date: 10/11/2017 Discharge date: 10/12/2017  Primary Care Provider: [facility] Primary Cardiologist: Dr. [name]    Discharge Diagnoses    Principal Problem:   Severe aortic stenosis Active Problems:   Atherosclerosis of native arteries of extremity with intermittent claudication (HCC)   CAD (coronary artery disease)   Paroxysmal atrial fibrillation   Diabetes mellitus type 2 with peripheral artery disease (HCC)   Hyperlipidemia   S/P CABG (coronary artery bypass graft)   History of GI bleed   Cerebral infarction ([place])   Stroke (HCC)   Chronic diastolic CHF (congestive heart failure) ([place])   Essential hypertension   CKD (chronic kidney disease)   Allergies Allergies  Allergen Reactions  . Morphine And Related Nausea And Vomiting    Chest pain    . Lisinopril Cough  . Morphine   . Sertraline Hcl Nausea Only    [name]  . Zoloft [Sertraline Hcl] Nausea Only          History of Present Illness    Kristina Summers is a 82 y.o. female with a history of CAD s/p CABG x4V (2000), CVA with residual expressive aphasia, GI bleeding, PAD s/p several LE interventions, chronic diastolic CHF, anemia, CKD, DMT2, PAF on Eliquis and severe AS (undergoing work up for TAVR) who presented to [facility] on 10/11/17 for planned [facility] for pre TAVR work up.   She has a history of inferior MI in 1992 and ultimately was treated with multivessel CABG in 2000 by Dr Cyndia Bent. In 2016 the patient had a stroke and has residual expressive aphasia. She ambulates with a walker, but remains functionally independent. She has been anticoagulated with apixaban for secondary stroke prevention since then. She previously had GI bleeding on warfarin and this had been discontinued. The patient also has significant PAD and has undergone several  revascularization procedures including right common iliac stenting and orbital atherectomy/PTA procedures on the left SFA. She ambulates with a walker and has fairly advanced vision loss. Despite all of her medical problems, she remains independent.   She has recently been admitted twice over the past year (since 01/2017) with acute CHF. 2D ECHO 08/26/17 showed EF 50-55% and severe AS with AVA 0.9 cm2 and mean gradient (S): 21 mm Hg; peak gradient (S): 34 mm Hg. She was seen by Dr. Burt Knack in the multidisciplinary valve clinic for evaluation of possible TAVR. He was suspicious that she had severe AS with a highly calcified aortic valve with very limited leaflet mobility. She was set up for diagnostic L/RHC on 10/11/17.   Hospital Course     Consultants: none  She underwent [facility] 10/11/17 which showed severe native vessel CAD with continued patency of the SVG to PDA, SVG to OM, SVG to diagonal, and LIMA to LAD and severe AS with a mean transvalvular gradient of 43 mmHg and calculated aortic valve area of 0.8 cm. She was kept overnight since she did not have transportation home. The patient's blood glucose was noted to be quite low and she reported frequent lows at home requiring her to take glucose tablets at night and in the mornings. We asked the diabetic coordinator to evaluate her and make recommendations, which are outlined below.    Reduce NPH to 15 units qam, 10 units qpm Humulin R 5 units breakfast and dinner  Check glucose 3-4 times/day  I have arranged a follow up appointment for her with her family medicine NP next Monday 10/17/17 for follow up.     The patient has had an uncomplicated hospital course and is recovering well. The femoral catheter site is stable. All follow-up appointments have been scheduled. Discharge medications are listed below.  _____________  Discharge Vitals Blood pressure (!) 189/62, pulse 72, temperature 98.2 F (36.8 C), temperature source Oral, resp. rate 18,  height 5\' 6"  (1.676 m), weight 198 lb 10.2 oz (90.1 kg), SpO2 100 %.  Filed Weights   10/11/17 1010 10/12/17 0311  Weight: 202 lb (91.6 kg) 198 lb 10.2 oz (90.1 kg)    Labs & Radiologic Studies     CBC No results for input(s): WBC, NEUTROABS, HGB, HCT, MCV, PLT in the last 72 hours. Basic Metabolic Panel No results for input(s): NA, K, CL, CO2, GLUCOSE, BUN, CREATININE, CALCIUM, MG, PHOS in the last 72 hours. Liver Function Tests No results for input(s): AST, ALT, ALKPHOS, BILITOT, PROT, ALBUMIN in the last 72 hours. No results for input(s): LIPASE, AMYLASE in the last 72 hours. Cardiac Enzymes No results for input(s): CKTOTAL, CKMB, CKMBINDEX, TROPONINI in the last 72 hours. BNP Invalid input(s): POCBNP D-Dimer No results for input(s): DDIMER in the last 72 hours. Hemoglobin A1C No results for input(s): HGBA1C in the last 72 hours. Fasting Lipid Panel No results for input(s): CHOL, HDL, LDLCALC, TRIG, CHOLHDL, LDLDIRECT in the last 72 hours. Thyroid Function Tests No results for input(s): TSH, T4TOTAL, T3FREE, THYROIDAB in the last 72 hours.  Invalid input(s): FREET3  No results found.   Diagnostic Studies/Procedures    10/11/17 RIGHT/LEFT HEART CATH AND CORONARY/GRAFT ANGIOGRAPHY  Conclusion    Ost 1st Mrg lesion is 100% stenosed.  Ost 2nd Mrg lesion is 100% stenosed.  Prox RCA lesion is 100% stenosed.  Prox LAD to Mid LAD lesion is 70% stenosed.   1.  Severe native vessel coronary artery disease with total occlusion of the RCA and OM branches left circumflex, moderately severe mid LAD stenosis 2.  Status post CABG with continued patency of the SVG to PDA, SVG to OM, SVG to diagonal, and LIMA to LAD 3.  Severe aortic stenosis with a mean transvalvular gradient of 43 mmHg and calculated aortic valve area of 0.8 cm.  Recommendation: Continued multidisciplinary heart team evaluation for treatment of aortic stenosis/TAVR evaluation     _____________   Disposition   Pt is being discharged home today in good condition.  Follow-up Plans & Appointments    Follow-up Information    [facility] [place] Follow up.   Specialty:  [place] Why:  resume [place], [place], [place] Contact information: [place] [address] [phone]        Marcie Mowers, FNP. Go on 10/17/2017.   Specialty:  Family Medicine Why:  @ 9:45am  Contact information: [address] [place] [address] [phone]        Sherren Mocha, MD Follow up.   Specialty:  Cardiology Why:  Follow instuctions on letter given to you by Nell Range PA-C Contact information: [address] [place] [address] [phone]            Discharge Medications     Medication List    TAKE these medications   acetaminophen 325 MG tablet Commonly known as:  TYLENOL Take 650 mg by mouth every 6 (six) hours as needed for headache.  amLODipine 10 MG tablet Commonly known as:  NORVASC Take 10 mg by mouth every evening.   apixaban 5 MG Tabs tablet Commonly known as:  ELIQUIS Take 1 tablet (5 mg total) by mouth 2 (two) times daily.   atorvastatin 80 MG tablet Commonly known as:  LIPITOR Take 1 tablet (80 mg total) by mouth daily. What changed:  when to take this   furosemide 40 MG tablet Commonly known as:  LASIX Take 40 mg by mouth 2 (two) times daily.   glucose 4 GM chewable tablet Chew 4 tablets by mouth once as needed for low blood sugar.   HUMULIN R 100 units/mL injection Generic drug:  insulin regular Inject 5 units subcutaneously BEFORE BREAKFAST AND DINNER   insulin NPH Human 100 UNIT/ML injection Commonly known as:  HUMULIN N,NOVOLIN N Inject 0.15 mLs (15 Units total) into the skin 2 (two) times daily. 15 units in the morning and 10 units in the PM What changed:    how much to take  additional instructions   losartan 50 MG tablet Commonly known as:  COZAAR Take 1  tablet (50 mg total) by mouth daily. What changed:  when to take this   metoprolol tartrate 25 MG tablet Commonly known as:  LOPRESSOR Take 25 mg by mouth 2 (two) times daily.   NITROSTAT 0.4 MG SL tablet Generic drug:  nitroGLYCERIN Place 1 tablet under the tongue every 5 (five) minutes x 3 doses as needed for chest pain. Chest pain   ONE-A-DAY 50 PLUS PO Take 1 tablet by mouth daily.         Outstanding Labs/Studies   Continue TAVR work up.   Duration of Discharge Encounter   Greater than 30 minutes  Signed, Angelena Form PA-C 10/12/2017, 12:12 PM  Pt admitted post-cath because of limited support (nobody to stay with her). She has had recurrent severe hypoglycemia requiring frequent use of glucose tablets. She underwent cardiac cath yesterday without complication. Her insulin regimen has been adjusted - appreciate help of the Diabetes Nurse. She will continue workup for definitive treatment of her aortic stenosis as outlined. Pt not seen by me this morning - discharge done by Nell Range, PA-C and I agree with her documentation.  Sherren Mocha 10/12/2017 8:54 PM

## 2017-10-13 DIAGNOSIS — E11621 Type 2 diabetes mellitus with foot ulcer: Secondary | ICD-10-CM | POA: Diagnosis not present

## 2017-10-21 ENCOUNTER — Encounter (HOSPITAL_COMMUNITY): Payer: Self-pay

## 2017-10-21 ENCOUNTER — Ambulatory Visit (HOSPITAL_COMMUNITY)
Admission: RE | Admit: 2017-10-21 | Discharge: 2017-10-21 | Disposition: A | Discharge status: Home / Self Care | Payer: Medicare Other | Source: Ambulatory Visit | Attending: Cardiovascular Disease | Admitting: Cardiovascular Disease

## 2017-10-21 DIAGNOSIS — I700 Atherosclerosis of aorta: Secondary | ICD-10-CM | POA: Diagnosis not present

## 2017-10-21 DIAGNOSIS — I517 Cardiomegaly: Secondary | ICD-10-CM | POA: Insufficient documentation

## 2017-10-21 DIAGNOSIS — R0602 Shortness of breath: Secondary | ICD-10-CM

## 2017-10-21 DIAGNOSIS — Z951 Presence of aortocoronary bypass graft: Secondary | ICD-10-CM | POA: Insufficient documentation

## 2017-10-21 DIAGNOSIS — I350 Nonrheumatic aortic (valve) stenosis: Secondary | ICD-10-CM

## 2017-10-21 DIAGNOSIS — I2510 Atherosclerotic heart disease of native coronary artery without angina pectoris: Secondary | ICD-10-CM | POA: Diagnosis not present

## 2017-10-21 DIAGNOSIS — N289 Disorder of kidney and ureter, unspecified: Secondary | ICD-10-CM

## 2017-10-21 LAB — PULMONARY FUNCTION TEST
FEF 25-75 POST: 0.64 L/s
FEF 25-75 Pre: 0.54 L/sec
FEF2575-%Change-Post: 18 %
FEF2575-%Pred-Post: 42 %
FEF2575-%Pred-Pre: 35 %
FEV1-%CHANGE-POST: 5 %
FEV1-%PRED-PRE: 38 %
FEV1-%Pred-Post: 40 %
FEV1-POST: 0.87 L
FEV1-Pre: 0.82 L
FEV1FVC-%Change-Post: 2 %
FEV1FVC-%PRED-PRE: 93 %
FEV6-%Change-Post: 2 %
FEV6-%PRED-POST: 44 %
FEV6-%Pred-Pre: 43 %
FEV6-POST: 1.21 L
FEV6-Pre: 1.18 L
FEV6FVC-%CHANGE-POST: 0 %
FEV6FVC-%PRED-POST: 104 %
FEV6FVC-%PRED-PRE: 104 %
FVC-%CHANGE-POST: 2 %
FVC-%Pred-Post: 42 %
FVC-%Pred-Pre: 41 %
FVC-Post: 1.22 L
FVC-Pre: 1.19 L
Post FEV1/FVC ratio: 71 %
Post FEV6/FVC ratio: 99 %
Pre FEV1/FVC ratio: 69 %
Pre FEV6/FVC Ratio: 100 %

## 2017-10-21 MED ORDER — SODIUM BICARBONATE 8.4 % IV SOLN
INTRAVENOUS | Status: AC
  Administered 2017-10-21: 14:00:00 via INTRAVENOUS
  Filled 2017-10-21: qty 500

## 2017-10-21 MED ORDER — SODIUM BICARBONATE BOLUS VIA INFUSION
INTRAVENOUS | Status: AC
  Administered 2017-10-21: 275 meq via INTRAVENOUS
  Filled 2017-10-21: qty 1

## 2017-10-21 MED ORDER — IOPAMIDOL (ISOVUE-370) INJECTION 76%
INTRAVENOUS | Status: AC
  Administered 2017-10-21: 100 mL
  Filled 2017-10-21: qty 100

## 2017-10-21 MED ORDER — ALBUTEROL SULFATE (2.5 MG/3ML) 0.083% IN NEBU
2.5000 mg | INHALATION_SOLUTION | Freq: Once | RESPIRATORY_TRACT | Status: AC
  Administered 2017-10-21: 2.5 mg via RESPIRATORY_TRACT

## 2017-10-26 ENCOUNTER — Encounter: Payer: Medicare Other | Admitting: Surgery

## 2017-10-26 ENCOUNTER — Ambulatory Visit (HOSPITAL_COMMUNITY)
Admit: 2017-10-26 | Discharge: 2017-10-26 | Disposition: A | Discharge status: Home / Self Care | Payer: Medicare Other | Source: Ambulatory Visit | Attending: Cardiovascular Disease | Admitting: Cardiovascular Disease

## 2017-10-26 DIAGNOSIS — I6523 Occlusion and stenosis of bilateral carotid arteries: Secondary | ICD-10-CM | POA: Insufficient documentation

## 2017-10-26 DIAGNOSIS — R0602 Shortness of breath: Secondary | ICD-10-CM | POA: Diagnosis not present

## 2017-10-26 DIAGNOSIS — I350 Nonrheumatic aortic (valve) stenosis: Secondary | ICD-10-CM

## 2017-10-26 NOTE — Progress Notes (Signed)
Bilateral carotid duplex completed. 1% to 39% ICA stenosis. Vertebral artery flow is antegrade. [place] Jazmina Muhlenkamp,RVS 10/26/2017 10:16 AM

## 2017-10-27 ENCOUNTER — Encounter (HOSPITAL_BASED_OUTPATIENT_CLINIC_OR_DEPARTMENT_OTHER): Payer: Medicare Other | Attending: Internal Medicine

## 2017-10-27 ENCOUNTER — Ambulatory Visit: Payer: Medicare Other | Admitting: Physical Therapy

## 2017-10-28 ENCOUNTER — Ambulatory Visit: Payer: Medicare Other | Admitting: Physical Therapy

## 2017-10-31 ENCOUNTER — Encounter: Payer: Self-pay | Admitting: Thoracic Surgery (Cardiothoracic Vascular Surgery)

## 2017-10-31 ENCOUNTER — Other Ambulatory Visit: Payer: Self-pay

## 2017-10-31 ENCOUNTER — Institutional Professional Consult (permissible substitution) (INDEPENDENT_AMBULATORY_CARE_PROVIDER_SITE_OTHER): Payer: Medicare Other | Admitting: Thoracic Surgery (Cardiothoracic Vascular Surgery)

## 2017-10-31 ENCOUNTER — Other Ambulatory Visit: Payer: Self-pay | Admitting: *Deleted

## 2017-10-31 VITALS — BP 129/56 | HR 72 | Resp 16 | Ht 66.0 in | Wt 210.0 lb

## 2017-10-31 DIAGNOSIS — I350 Nonrheumatic aortic (valve) stenosis: Secondary | ICD-10-CM | POA: Diagnosis not present

## 2017-10-31 DIAGNOSIS — I5032 Chronic diastolic (congestive) heart failure: Secondary | ICD-10-CM

## 2017-10-31 DIAGNOSIS — Z951 Presence of aortocoronary bypass graft: Secondary | ICD-10-CM | POA: Diagnosis not present

## 2017-10-31 DIAGNOSIS — I70213 Atherosclerosis of native arteries of extremities with intermittent claudication, bilateral legs: Secondary | ICD-10-CM

## 2017-10-31 NOTE — Patient Instructions (Addendum)
Stop taking Eliquis on Thursday 11/03/2017  Continue taking all other medications without change through the day before surgery.  Have nothing to eat or drink after midnight the night before surgery.  On the morning of surgery do not take any of your regular medications.

## 2017-10-31 NOTE — Progress Notes (Addendum)
HEART AND [place] SURGERY CONSULTATION REPORT  Referring Provider is Jacolyn Reedy, MD PCP is Care, [facility]  Chief Complaint  Patient presents with  . 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    Patient's cardiac history dates back to 1992 when she suffered an acute inferior wall myocardial infarction.  She was initially treated with PCI but she eventually underwent multivessel coronary artery bypass grafting by Dr. Cyndia Bent in 2000.  She did well for many years and has been followed chronically by Dr. [name].  She was noted to have a heart murmur on physical exam and echocardiograms have demonstrated the presence of aortic stenosis that has slowly progressed over time.  She developed atrial fibrillation and was initially treated with warfarin anticoagulation.  In 2016 she suffered a stroke.  She slowly recovered but has been left with residual expressive aphasia.  She has known history of peripheral arterial disease and has been followed by Dr. Bridgett Larsson for many years.  She had right common iliac artery stenting and  atherectomy and PTA of the left superficial femoral artery.  She describes moderately limiting symptoms of fatigue in both lower legs with ambulation that limit her mobility considerably.  She also has very poor vision and is legally blind.  This developed after cataract extraction on the left eye several years ago with previous history of a "lazy eye" on the right that was never treated during childhood.  She was hospitalized twice this spring with acute exacerbation of chronic diastolic congestive heart failure.  Echocardiogram performed Aug 26, 2017 revealed significant progression and severity of aortic stenosis with preserved left ventricular systolic function.  Ejection fraction was estimated 50 to 55%.  Peak velocity across the aortic valve was reported 2.9 m/s but varied somewhat because of the patient's underlying atrial fibrillation.  Mean transvalvular gradient was reported only 21 mmHg but the DVI was reported only 0.26 and aortic valve area calculated 0.9 cm.  The patient was referred to the multidisciplinary heart valve clinic and has been evaluated previously by Dr. Burt Knack.  Left and right heart catheterization performed October 11, 2017 revealed severe aortic stenosis with mean transvalvular gradient measured 43 mmHg by catheterization.  Aortic valve area was calculated 0.8 cm.  The patient was noted to have severe native three-vessel coronary artery disease with chronic total occlusion of the right coronary artery and the obtuse marginal branches of the left circumflex coronary artery.  There was moderate disease of the mid left anterior descending coronary artery.  All of the previous patient's bypass grafts remain patent, although the left internal mammary artery was small and somewhat atretic.  The vein graft to the large diagonal branch was widely patent and provided excellent flow throughout the entire distal left anterior descending coronary artery.  There was continued  patency of vein graft to  the posterior descending coronary artery and the obtuse marginal branch of the left circumflex coronary artery as well.  The patient subsequently underwent CT angiography and was referred for surgical consultation.  The patient is originally from [place] but has been living in the [place] for more than 30 years.  She is single having divorced her husband many years ago.  She lives alone in an apartment in [place] and is followed by [facility].  She has 3 adult children, all of whom live reasonably close to [place].  Her oldest daughter is a Marine scientist who lives in [place].  She is accompanied by her younger daughter for her office visit consultation today.  The patient does not drive an automobile because she is legally blind.  She ambulates short distances without assistance.  She is limited by poor mobility and a tendency for both lower legs get tired with any activity.  She presently describes stable symptoms of mild exertional shortness of breath that occur only with more strenuous activity.  She has never had any chest pain or chest tightness.  At the time of her recent hospitalizations this spring she had resting shortness of breath and severe lower extremity edema.  She takes insulin at home and checks her blood sugars daily.  Glycemic control has been improved recently.  Past Medical History:  Diagnosis Date  . Anemia   . CAD (coronary artery disease)    a. s/p CABG in 2000  . Carotid artery occlusion   . CKD (chronic kidney disease)   . Diastolic CHF ([place]) 36/1443  . Fibromyalgia   . GERD (gastroesophageal reflux disease)   . History of GI bleed   . Hyperlipidemia   . Hypertension   . Obesity   . PAF (paroxysmal atrial fibrillation) ([place])    a. on Xarelto.   . Peripheral vascular disease ([place])   . Stroke ([place]) 05/24/2014  . Type II diabetes mellitus ([place]) dx'd 1979    Past Surgical History:  Procedure Laterality Date  . ABDOMINAL AORTAGRAM N/A  04/01/2011   Procedure: ABDOMINAL AORTAGRAM;  Surgeon: Conrad Wetmore, MD;  Location: [facility] CATH LAB;  Service: Cardiovascular;  Laterality: N/A;  . ANGIOPLASTY  06/17/11   Left leg common femoral artery cannulation under u/s Left leg runoff  . CARDIAC CATHETERIZATION  10/11/2017  . CARPAL TUNNEL RELEASE Right   . CATARACT EXTRACTION W/ INTRAOCULAR LENS  IMPLANT, BILATERAL  2004-2005  . CORONARY ARTERY BYPASS GRAFT  2000   CABG X5  . EYE SURGERY Left    "lasered before cataract OR"  . LOWER EXTREMITY ANGIOGRAM Bilateral 04/01/2011   Procedure: LOWER EXTREMITY ANGIOGRAM;  Surgeon: Conrad Wood Lake, MD;  Location: [facility] CATH LAB;  Service: Cardiovascular;  Laterality: Bilateral;  . LOWER EXTREMITY ANGIOGRAM Left 06/17/2011   Procedure: LOWER EXTREMITY ANGIOGRAM;  Surgeon: Conrad Kirby, MD;  Location: [facility] CATH LAB;  Service: Cardiovascular;  Laterality: Left;  . LOWER EXTREMITY ANGIOGRAM N/A 11/18/2011   Procedure: LOWER EXTREMITY ANGIOGRAM;  Surgeon: Conrad Lamont, MD;  Location: [facility] CATH LAB;  Service: Cardiovascular;  Laterality: N/A;  . PERCUTANEOUS STENT INTERVENTION Right 04/01/2011   Procedure: PERCUTANEOUS STENT INTERVENTION;  Surgeon: Conrad Rosa Sanchez, MD;  Location: [facility] CATH LAB;  Service: Cardiovascular;  Laterality: Right;  rt iliac stent  . PERIPHERAL ARTERIAL STENT GRAFT  04/01/11   right common iliac  . RIGHT/LEFT HEART CATH AND CORONARY/GRAFT ANGIOGRAPHY N/A 10/11/2017   Procedure: RIGHT/LEFT  HEART CATH AND CORONARY/GRAFT ANGIOGRAPHY;  Surgeon: Sherren Mocha, MD;  Location: [place] CV LAB;  Service: Cardiovascular;  Laterality: N/A;  . TRIGGER FINGER RELEASE Left 1996   thumb    Family History  Problem Relation Age of Onset  . Other Brother        intestinal blockage  . Diabetes Brother     Social History   Socioeconomic History  . Marital status: Divorced    Spouse name: Not on file  . Number of children: 3  . Years of education: 65  . Highest education level: Not on file    Occupational History  . Occupation: Retired  Scientific laboratory technician  . Financial resource strain: Not on file  . Food insecurity:    Worry: Not on file    Inability: Not on file  . Transportation needs:    Medical: Not on file    Non-medical: Not on file  Tobacco Use  . Smoking status: Former Smoker    Packs/day: 2.00    Years: 30.00    Pack years: 60.00    Types: Cigarettes    Last attempt to quit: 04/19/1990    Years since quitting: 27.5  . Smokeless tobacco: Never Used  . Tobacco comment: stopped smoking cigarettes 1991  Substance and Sexual Activity  . Alcohol use: Not Currently    Alcohol/week: 0.0 oz    Comment: "tried different alcohols when I was 1st married; never drank much at  ALL"  . Drug use: Never  . Sexual activity: Not Currently  Lifestyle  . Physical activity:    Days per week: Not on file    Minutes per session: Not on file  . Stress: Not on file  Relationships  . Social connections:    Talks on phone: Not on file    Gets together: Not on file    Attends religious service: Not on file    Active member of club or organization: Not on file    Attends meetings of clubs or organizations: Not on file    Relationship status: Not on file  . Intimate partner violence:    Fear of current or ex partner: Not on file    Emotionally abused: Not on file    Physically abused: Not on file    Forced sexual activity: Not on file  Other Topics Concern  . Not on file  Social History Narrative   Divorced.  Native of [place].  Formerly worked as Education administrator person. 01/13/17 lives alone   Caffeine use: drinks decaf tea and coffee    Current Outpatient Medications  Medication Sig Dispense Refill  . acetaminophen (TYLENOL) 325 MG tablet Take 650 mg by mouth every 6 (six) hours as needed for headache.    Marland Kitchen amLODipine (NORVASC) 10 MG tablet Take 10 mg by mouth every evening.     Marland Kitchen apixaban (ELIQUIS) 5 MG TABS tablet Take 1 tablet (5 mg total) by mouth 2 (two) times daily. 60 tablet 0   . atorvastatin (LIPITOR) 80 MG tablet Take 1 tablet (80 mg total) by mouth daily. (Patient taking differently: Take 80 mg by mouth at bedtime. ) 30 tablet 0  . furosemide (LASIX) 40 MG tablet Take 40 mg by mouth 2 (two) times daily.  0  . glucose 4 GM chewable tablet Chew 4 tablets by mouth once as needed for low blood sugar.     Marland Kitchen HUMULIN R 100 UNIT/ML injection Inject 5 units subcutaneously BEFORE BREAKFAST AND DINNER  0  .  insulin NPH Human (HUMULIN N,NOVOLIN N) 100 UNIT/ML injection Inject 0.15 mLs (15 Units total) into the skin 2 (two) times daily. 15 units in the morning and 10 units in the PM 10 mL 11  . losartan (COZAAR) 50 MG tablet Take 1 tablet (50 mg total) by mouth daily. (Patient taking differently: Take 50 mg by mouth 2 (two) times daily. )    . metoprolol tartrate (LOPRESSOR) 25 MG tablet Take 25 mg by mouth 2 (two) times daily.     . Multiple Vitamins-Minerals (ONE-A-DAY 50 PLUS PO) Take 1 tablet by mouth daily.    Marland Kitchen NITROSTAT 0.4 MG SL tablet Place 1 tablet under the tongue every 5 (five) minutes x 3 doses as needed for chest pain. Chest pain  1   No current facility-administered medications for this visit.     Allergies  Allergen Reactions  . Morphine And Related Nausea And Vomiting    Chest pain    . Lisinopril Cough  . Morphine   . Sertraline Hcl Nausea Only    [name]  . Zoloft [Sertraline Hcl] Nausea Only           Review of Systems:   General:  Normal appetite, decreased energy, no weight gain, no weight loss, no fever  Cardiac:  no chest pain with exertion, no chest pain at rest, +SOB with exertion, no resting SOB, no PND, + orthopnea, no palpitations, + arrhythmia, + atrial fibrillation, + LE edema, no dizzy spells, no syncope  Respiratory:  no shortness of breath, no home oxygen, no productive cough, no dry cough, no bronchitis, no wheezing, no hemoptysis, no asthma, no pain with inspiration or cough, no sleep apnea, no CPAP at night  GI:   no  difficulty swallowing, no reflux, no frequent heartburn, no hiatal hernia, no abdominal pain, + constipation, no diarrhea, no hematochezia, no hematemesis, no melena  GU:   no dysuria,  no frequency, no urinary tract infection, no hematuria, no kidney stones, + kidney disease  Vascular:  no pain suggestive of claudication, no pain in feet, no leg cramps, no varicose veins, no DVT, + non-healing foot ulcer  Neuro:   + stroke, no TIA's, no seizures, no headaches, no temporary blindness one eye,  no slurred speech, + peripheral neuropathy, no chronic pain, + instability of gait, + memory/cognitive dysfunction  Musculoskeletal: + arthritis, no joint swelling, no myalgias, + some difficulty walking, decreased mobility   Skin:   no rash, no itching, + skin infections, no pressure sores or ulcerations  Psych:   no anxiety, no depression, no nervousness, no unusual recent stress  Eyes:   + blurry vision, no floaters, no recent vision changes, does not wear glasses or contacts  ENT:   no hearing loss, no loose or painful teeth, edentulous with full dentures, last saw dentist 2018  Hematologic:  + easy bruising, no abnormal bleeding, no clotting disorder, no frequent epistaxis  Endocrine:   diabetes, does check CBG's at home           Physical Exam:   BP (!) 129/56 (BP Location: Left Arm, Patient Position: Sitting, Cuff Size: Large)   Pulse 72   Resp 16   Ht 5\' 6"  (1.676 m)   Wt 210 lb (95.3 kg)   SpO2 94% Comment: RA  BMI 33.89 kg/m   General:  Obese female NAD  HEENT:  Unremarkable   Neck:   no JVD, no bruits, no adenopathy   Chest:  clear to auscultation, symmetrical breath sounds, no wheezes, no rhonchi  CV:   Irregular rate and rhythm, grade III/VI crescendo/decrescendo murmur heard best at LLSB,  no diastolic murmur  Abdomen:  soft, non-tender, no masses   Extremities:  warm, well-perfused, pulses not palpable, no LE edema  Rectal/GU  Deferred  Neuro:   Grossly non-focal and  symmetrical throughout  Skin:   Clean and dry, no rashes, no breakdown   Diagnostic Tests:  Transthoracic Echocardiography  Patient:    Kristina Summers, Kristina Summers MR #:       [id number] Study Date: 08/26/2017 Gender:     F Age:        72 Height:     167.6 cm Weight:     94.8 kg BSA:        2.14 m^2 Pt. Status: Room:       [place], [place]  PERFORMING   [facility], Inpatient  SONOGRAPHER  Jannett Celestine, RDCS  ORDERING     Vladimir Faster  Ephriam Jenkins  cc:  ------------------------------------------------------------------- LV EF: 50% -   55%  ------------------------------------------------------------------- History:   PMH:  CHF - Acute Diastolic 034.31.  Atrial fibrillation.  Coronary artery disease.  Congestive heart failure. Stroke.  PMH:   Myocardial infarction.  Risk factors: Hypertension. Diabetes mellitus. Dyslipidemia.  ------------------------------------------------------------------- Study Conclusions  - Left ventricle: The cavity size was normal. Wall thickness was   increased in a pattern of mild LVH. Systolic function was normal.   The estimated ejection fraction was in the range of 50% to 55%.   Suspected diastolic dysfunction and elevated LV filling pressure   (a-fib is noted). - Aortic valve: Calcified with restricted leaflet motion. There is   likely severe stenosis. Trivial regurgitation. Mean gradient (S):   21 mm Hg. Peak gradient (S): 34 mm Hg. Valve area (VTI): 0.9   cm^2. Valve area (Vmax): 0.81 cm^2. Valve area (Vmean): 0.83   cm^2. - Mitral valve: Calcified annulus. Mildly thickened leaflets . - Left atrium: Severely dilated. - Right ventricle: The cavity size was mildly dilated. Moderately   reduced systolic function. - Right atrium: The atrium was mildly dilated. - Inferior vena cava: The vessel was dilated. The respirophasic   diameter changes were blunted (< 50%),  consistent with elevated   central venous pressure.  Impressions:  - Compared to a prior study in 01/2017, the LVEF is unchanged.   There is now likely severe aortic stenosis with an AVA around 0.9   cm2, there is severe LAE and diastolic dysfunction with elevated   LV filling pressure.  ------------------------------------------------------------------- Study data:  Comparison was made to the study of 01/28/2017.  Study status:  Routine.  Procedure:  The patient reported no pain pre or post test. Transthoracic echocardiography. Image quality was adequate. The study was technically difficult, as a result of body habitus.  Study completion:  There were no complications. Transthoracic echocardiography.  M-mode, complete 2D, spectral Doppler, and color Doppler.  Birthdate:  Patient birthdate: Feb 02, 1936.  Age:  Patient is 82 yr old.  Sex:  Gender: female. BMI: 33.7 kg/m^2.  Blood pressure:     139/70  Patient status: Inpatient.  Study date:  Study date: 08/26/2017. Study time: 09:11 AM.  Location:  Bedside.  -------------------------------------------------------------------  ------------------------------------------------------------------- Left ventricle:  The cavity size was normal. Wall thickness was increased in a pattern of mild LVH. Systolic function was normal.  The estimated ejection fraction was in the range of 50% to 55%. Suspected diastolic dysfunction and elevated LV filling pressure (a-fib is noted).  ------------------------------------------------------------------- Aortic valve:  Calcified with restricted leaflet motion. There is likely severe stenosis. Trivial regurgitation.  Doppler:     VTI ratio of LVOT to aortic valve: 0.29. Valve area (VTI): 0.9 cm^2. Indexed valve area (VTI): 0.42 cm^2/m^2. Peak velocity ratio of LVOT to aortic valve: 0.26. Valve area (Vmax): 0.81 cm^2. Indexed valve area (Vmax): 0.38 cm^2/m^2. Mean velocity ratio of LVOT to aortic  valve: 0.26. Valve area (Vmean): 0.83 cm^2. Indexed valve area (Vmean): 0.39 cm^2/m^2.    Mean gradient (S): 21 mm Hg. Peak gradient (S): 34 mm Hg.  ------------------------------------------------------------------- Aorta:  Aortic root: The aortic root was normal in size. Ascending aorta: The ascending aorta was normal in size.  ------------------------------------------------------------------- Mitral valve:   Calcified annulus. Mildly thickened leaflets . Doppler:  There was trivial regurgitation.    Peak gradient (D): 6 mm Hg.  ------------------------------------------------------------------- Left atrium:  Severely dilated.  ------------------------------------------------------------------- Atrial septum:  No defect or patent foramen ovale was identified.   ------------------------------------------------------------------- Right ventricle:  The cavity size was mildly dilated. Moderately reduced systolic function.  ------------------------------------------------------------------- Pulmonic valve:   Poorly visualized.  Doppler:  There was no significant regurgitation.  ------------------------------------------------------------------- Tricuspid valve:   Doppler:  There was no significant regurgitation.  ------------------------------------------------------------------- Pulmonary artery:   Poorly visualized.  ------------------------------------------------------------------- Right atrium:  The atrium was mildly dilated.  ------------------------------------------------------------------- Pericardium:  There was no pericardial effusion.  ------------------------------------------------------------------- Systemic veins: Inferior vena cava: The vessel was dilated. The respirophasic diameter changes were blunted (< 50%), consistent with elevated central venous  pressure.  ------------------------------------------------------------------- Measurements   Left ventricle                           Value          Reference  LV ID, ED, PLAX chordal          (H)     55    mm       43 - 52  LV ID, ES, PLAX chordal          (H)     41    mm       23 - 38  LV fx shortening, PLAX chordal   (L)     25    %        >=29  LV PW thickness, ED                      13    mm       ----------  IVS/LV PW ratio, ED                      0.85           <=1.3  Stroke volume, 2D                        61    ml       ----------  Stroke volume/bsa, 2D                    29    ml/m^2   ----------    Ventricular septum                       Value  Reference  IVS thickness, ED                        11    mm       ----------    LVOT                                     Value          Reference  LVOT ID, S                               20    mm       ----------  LVOT area                                3.14  cm^2     ----------  LVOT peak velocity, S                    75.2  cm/s     ----------  LVOT mean velocity, S                    56    cm/s     ----------  LVOT VTI, S                              19.3  cm       ----------  Stroke volume (SV), LVOT DP              60.6  ml       ----------  Stroke index (SV/bsa), LVOT DP           28.4  ml/m^2   ----------    Aortic valve                             Value          Reference  Aortic valve peak velocity, S            292   cm/s     ----------  Aortic valve mean velocity, S            213   cm/s     ----------  Aortic valve VTI, S                      67    cm       ----------  Aortic mean gradient, S                  21    mm Hg    ----------  Aortic peak gradient, S                  34    mm Hg    ----------  VTI ratio, LVOT/AV                       0.29           ----------  Aortic valve area, VTI                   0.9  cm^2     ----------  Aortic valve area/bsa, VTI               0.42  cm^2/m^2  ----------  Velocity ratio, peak, LVOT/AV            0.26           ----------  Aortic valve area, peak velocity         0.81  cm^2     ----------  Aortic valve area/bsa, peak              0.38  cm^2/m^2 ----------  velocity  Velocity ratio, mean, LVOT/AV            0.26           ----------  Aortic valve area, mean velocity         0.83  cm^2     ----------  Aortic valve area/bsa, mean              0.39  cm^2/m^2 ----------  velocity    Aorta                                    Value          Reference  Aortic root ID, ED                       32    mm       ----------    Left atrium                              Value          Reference  LA ID, A-P, ES                           45    mm       ----------  LA ID/bsa, A-P                           2.11  cm/m^2   <=2.2  LA volume, S                             103   ml       ----------  LA volume/bsa, S                         48.2  ml/m^2   ----------  LA volume, ES, 1-p A4C                   121   ml       ----------  LA volume/bsa, ES, 1-p A4C               56.6  ml/m^2   ----------  LA volume, ES, 1-p A2C                   85.5  ml       ----------  LA volume/bsa, ES, 1-p A2C               40    ml/m^2   ----------    Mitral valve  Value          Reference  Mitral E-wave peak velocity              126   cm/s     ----------  Mitral deceleration time                 162   ms       150 - 230  Mitral peak gradient, D                  6     mm Hg    ----------    Right atrium                             Value          Reference  RA ID, S-I, ES, A4C              (H)     66.8  mm       34 - 49  RA area, ES, A4C                 (H)     20.9  cm^2     8.3 - 19.5  RA volume, ES, A/L                       55.2  ml       ----------  RA volume/bsa, ES, A/L                   25.8  ml/m^2   ----------    Right ventricle                          Value          Reference  RV ID, minor axis, ED, A4C base          35    mm        ----------  RV ID, minor axis, ED, A4C mid           34    mm       ----------  RV ID, major axis, ED, A4C               63    mm       55 - 91  TAPSE                                    11.5  mm       ----------  RV s&', lateral, S                        5.98  cm/s     ----------  Legend: (L)  and  (H)  mark values outside specified reference range.  ------------------------------------------------------------------- Prepared and Electronically Authenticated by  Lyman Bishop MD 2019-05-10T12:14:35    RIGHT/LEFT HEART CATH AND CORONARY/GRAFT ANGIOGRAPHY  Conclusion     Ost 1st Mrg lesion is 100% stenosed.  Ost 2nd Mrg lesion is 100% stenosed.  Prox RCA lesion is 100% stenosed.  Prox LAD to Mid LAD lesion is 70% stenosed.   1.  Severe native vessel coronary artery disease with total occlusion of the RCA and OM branches left circumflex, moderately  severe mid LAD stenosis 2.  Status post CABG with continued patency of the SVG to PDA, SVG to OM, SVG to diagonal, and LIMA to LAD 3.  Severe aortic stenosis with a mean transvalvular gradient of 43 mmHg and calculated aortic valve area of 0.8 cm.  Recommendation: Continued multidisciplinary heart team evaluation for treatment of aortic stenosis/TAVR evaluation   Indications   Severe aortic stenosis [I35.0 (ICD-10-CM)]  Procedural Details/Technique   Technical Details INDICATION: Severe aortic stenosis, progressive dyspnea, recurrent diastolic heart failure.  PROCEDURAL DETAILS: There was an indwelling IV in a right antecubital vein. Using normal sterile technique, the IV was changed out for a 5 Fr brachial sheath over a 0.018 inch wire. The left wrist was then prepped, draped, and anesthetized with 1% lidocaine. Ultrasound guidance is used. The left radial artery is very small. Attempts were made to cannulate the vessel but this is unsuccessful. Attention was then turned to the right groin. Ultrasound guidance was used  to assess the right femoral artery. Using direct ultrasound access a 5 French sheath was inserted into the right femoral artery. Ultrasound images are captured and stored in the patient's paper chart. A Swan-Ganz catheter was used for the right heart catheterization. Standard protocol was followed for recording of right heart pressures and sampling of oxygen saturations. Fick cardiac output was calculated. Standard Judkins catheters were used for selective coronary angiography and bypass angiography. An AL-1 catheter was used to direct a straight tip wire across the aortic valve. LV pressure is sampled and a pullback gradient is recorded across the aortic valve. There were no immediate procedural complications. The patient was transferred to the post catheterization recovery area for further monitoring.     Estimated blood loss <50 mL.  During this procedure the patient was administered the following to achieve and maintain moderate conscious sedation: Versed 1 mg, Fentanyl 25 mcg, while the patient's heart rate, blood pressure, and oxygen saturation were continuously monitored. The period of conscious sedation was 53 minutes, of which I was present face-to-face 100% of this time.  Coronary Findings   Diagnostic  Dominance: Right  Left Main  There is mild diffuse disease throughout the vessel.  Left Anterior Descending  Prox LAD to Mid LAD lesion 70% stenosed  Prox LAD to Mid LAD lesion is 70% stenosed.  Ramus Intermedius  Patent, divides into twin vessels  Left Circumflex  First Obtuse Marginal Branch  Ost 1st Mrg lesion 100% stenosed  Ost 1st Mrg lesion is 100% stenosed.  Second Obtuse Marginal Branch  Ost 2nd Mrg lesion 100% stenosed  Ost 2nd Mrg lesion is 100% stenosed.  Right Coronary Artery  Prox RCA lesion 100% stenosed  Prox RCA lesion is 100% stenosed.  Graft to RPDA  The saphenous vein graft to PDA is widely patent.  Graft to 1st Mrg  The saphenous vein graft to first obtuse  marginal is widely patent  Graft to Ost 1st Diag  The saphenous vein graft to first diagonal is widely patent with minimal irregularities. The LAD fills primarily from both native flow and retrograde flow from this graft.  LIMA Graft to Mid LAD  Patent graft. Imaged nonselectively from the left subclavian artery. Not well visualized distally.  Intervention   No interventions have been documented.  Coronary Diagrams   Diagnostic Diagram       Implants    No implant documentation for this case.  MERGE Images   Show images for CARDIAC CATHETERIZATION   Link to Procedure Log  Procedure Log    Hemo Data    Most Recent Value  Fick Cardiac Output 6.92 L/min  Fick Cardiac Output Index 3.44 (L/min)/BSA  Aortic Mean Gradient 42.9 mmHg  Aortic Peak Gradient 39 mmHg  Aortic Valve Area 0.81  Aortic Value Area Index 0.4 cm2/BSA  RA A Wave 8 mmHg  RA V Wave 11 mmHg  RA Mean 9 mmHg  RV Systolic Pressure 42 mmHg  RV Diastolic Pressure 3 mmHg  RV EDP 7 mmHg  PA Systolic Pressure 45 mmHg  PA Diastolic Pressure 19 mmHg  PA Mean 30 mmHg  PW A Wave 33 mmHg  PW V Wave 36 mmHg  PW Mean 26 mmHg  AO Systolic Pressure 785 mmHg  AO Diastolic Pressure 53 mmHg  AO Mean 93 mmHg  LV Systolic Pressure 885 mmHg  LV Diastolic Pressure 16 mmHg  LV EDP 22 mmHg  AOp Systolic Pressure 027 mmHg  AOp Diastolic Pressure 52 mmHg  AOp Mean Pressure 90 mmHg  LVp Systolic Pressure 741 mmHg  LVp Diastolic Pressure 14 mmHg  LVp EDP Pressure 20 mmHg  QP/QS 1  TPVR Index 8.71 HRUI  TSVR Index 27.02 HRUI  PVR SVR Ratio 0.05  TPVR/TSVR Ratio 0.32     Cardiac TAVR CT  TECHNIQUE: The patient was scanned on a [employer]. A 120 kV retrospective scan was triggered in the descending thoracic aorta at 111 HU's. Gantry rotation speed was 250 msecs and collimation was .6 mm. No beta blockade or nitro were given. The 3D data set was reconstructed in 5% intervals of the R-R cycle. Systolic  and diastolic phases were analyzed on a dedicated work station using MPR, MIP and VRT modes. The patient received 80 cc of contrast.  FINDINGS: Aortic Valve: Trileaflet, with severely calcified and thickened leaflets and no calcifications extending into the LVOT.  Aorta: Normal size, moderate diffuse calcifications and atheroma in the aortic arch and descending thoracic aorta.  Sinotubular Junction: 28 x 27 mm  Ascending Thoracic Aorta: 33 x 30 mm  Aortic Arch: 26 x 23 mm  Descending Thoracic Aorta: 23 x 22 mm  Sinus of Valsalva Measurements:  Non-coronary: 34  mm  Right -coronary: 31 mm  Left -coronary: 32 mm  Coronary Artery Height above Annulus:  Left Main: 13 mm  Right Coronary: 19 mm  Virtual Basal Annulus Measurements:  Maximum/Minimum Diameter: 29.2 x 22.6 mm  Mean Diameter: 24.9 mm  Perimeter: 80.8 mm  Area: 487 mm 2  Optimum Fluoroscopic Angle for Delivery: LAO 11 CAU 11  IMPRESSION: 1. Trileaflet, with severely calcified and thickened leaflets and no calcifications extending into the LVOT. Annular measurements suitable for delivery of a 26 mm Edwards-SAPIEN 3 valve.  2. Sufficient coronary to annulus distance.  3. Optimum Fluoroscopic Angle for Delivery: LAO 11 CAU 11  4. No thrombus in the left atrial appendage (on delayed imaging).   Electronically Signed   By: Ena Dawley   On: 10/22/2017 13:55    CT ANGIOGRAPHY CHEST, ABDOMEN AND PELVIS  TECHNIQUE: Multidetector CT imaging through the chest, abdomen and pelvis was performed using the standard protocol during bolus administration of intravenous contrast. Multiplanar reconstructed images and MIPs were obtained and reviewed to evaluate the vascular anatomy.  CONTRAST:  132mL ISOVUE-370 IOPAMIDOL (ISOVUE-370) INJECTION 76%  COMPARISON:  Chest CT 08/25/2017.  FINDINGS: CTA CHEST FINDINGS  Cardiovascular: Cardiomegaly with left atrial dilatation.  Filling defect in the tip of the left atrial appendage (axial image 51 of series 14),  concerning for potential thrombus although this may alternatively represent pseudo thrombus. There is aortic atherosclerosis, as well as atherosclerosis of the great vessels of the mediastinum and the coronary arteries, including calcified atherosclerotic plaque in the left main, left anterior descending, left circumflex and right coronary arteries. Status post median sternotomy for CABG including LIMA to the LAD. Severe thickening calcification of the aortic valve. Calcifications of the mitral annulus.  Mediastinum/Lymph Nodes: No pathologically enlarged mediastinal or hilar lymph nodes. Esophagus is unremarkable in appearance. No axillary lymphadenopathy.  Lungs/Pleura: No suspicious appearing pulmonary nodules or masses. No acute consolidative airspace disease. No pleural effusions.  Musculoskeletal/Soft Tissues: There are no aggressive appearing lytic or blastic lesions noted in the visualized portions of the skeleton. Median sternotomy wires.  CTA ABDOMEN AND PELVIS FINDINGS  Hepatobiliary: Liver has a slightly shrunken appearance and nodular contour, indicative of underlying cirrhosis. No definite cystic or solid hepatic lesions. No intra or extrahepatic biliary ductal dilatation. Several small calcified gallstones lying dependently in the gallbladder. No surrounding inflammatory changes to suggest an acute cholecystitis at this time.  Pancreas: No pancreatic mass. No pancreatic ductal dilatation. No pancreatic or peripancreatic fluid or inflammatory changes.  Spleen: Unremarkable.  Adrenals/Urinary Tract: Bilateral kidneys and bilateral adrenal glands are normal in appearance. No hydroureteronephrosis. Small amount of gas non dependently in the lumen of the urinary bladder. Urinary bladder is otherwise unremarkable in appearance.  Stomach/Bowel: Normal appearance of stomach.  No pathologic dilatation of small bowel or colon. Numerous colonic diverticulae are noted, without surrounding inflammatory changes to suggest an acute diverticulitis at this time. Normal appendix.  Vascular/Lymphatic: Aortic atherosclerosis, without evidence of aneurysm or dissection in the abdominal or pelvic vasculature. Vascular findings and measurements pertinent to potential TAVR procedure, as detailed below. Celiac axis and inferior mesenteric artery are widely patent without hemodynamically significant stenosis. Atherosclerosis at the ostium of the superior mesenteric artery with what appears to be mild to moderate stenosis. Atherosclerosis at the ostium of the single right renal artery with mild to moderate stenosis. Left renal artery appears patent without hemodynamically significant stenosis. No lymphadenopathy noted in the abdomen or pelvis.  Reproductive: Uterus and ovaries are unremarkable in appearance.  Other: No significant volume of ascites.  No pneumoperitoneum.  Musculoskeletal: There are no aggressive appearing lytic or blastic lesions noted in the visualized portions of the skeleton.  VASCULAR MEASUREMENTS PERTINENT TO TAVR:  AORTA:  Minimal Aortic Diameter-12 x 10 mm  Severity of Aortic Calcification-severe  RIGHT PELVIS:  Right Common Iliac Artery - Indwelling stent  Minimal Diameter-4.9 x 4.1 mm  Tortuosity-mild  Calcification-severe  Right External Iliac Artery -  Minimal Diameter-4.4 x 4.6 mm  Tortuosity - mild  Calcification-moderate  Right Common Femoral Artery -  Minimal Diameter-5.8 x 6.6 mm  Tortuosity - mild  Calcification-moderate  LEFT PELVIS:  Left Common Iliac Artery -  Minimal Diameter-7.3 x 4.5 mm  Tortuosity - mild  Calcification-severe  Left External Iliac Artery -  Minimal Diameter-5.7 x 4.8 mm  Tortuosity - mild  Calcification-moderate  Left Common Femoral Artery  -  Minimal Diameter-8.1 x 6.2 mm  Tortuosity - mild  Calcification-severe  Review of the MIP images confirms the above findings.  IMPRESSION: 1. Vascular findings and measurements pertinent to potential TAVR procedure, as detailed above. 2. Severe thickening calcification of the aortic valve, compatible with the reported clinical history of aortic stenosis. 3. Cardiomegaly with left atrial dilatation and filling defect in the tip of the left atrial appendage concerning for potential  left atrial appendage thrombus. Attention at time of forthcoming transesophageal echocardiography is recommended. 4. Aortic atherosclerosis, in addition to left main and 3 vessel coronary artery disease. Status post median sternotomy for CABG including LIMA to the LAD. 5. Small amount of gas non dependently in the lumen of the urinary bladder. Statistically, this is likely iatrogenic from recent catheterization, however, if there is no recent history of catheterization, correlation with urinalysis would be recommended to exclude the possibility of urinary tract infection with gas-forming organisms. 6. Additional incidental findings, as above.   Electronically Signed   By: Vinnie Langton M.D.   On: 10/21/2017 16:54   STS Risk Calculator  Procedure: Isolated AVR CALCULATE   Risk of Mortality:  5.725% Renal Failure:  4.977% Permanent Stroke:  4.792% Prolonged Ventilation:  21.084% DSW Infection:  0.518% Reoperation:  2.825% Morbidity or Mortality:  27.010% Short Length of Stay:  7.976% Long Length of Stay:  21.230%    Procedure: AVR + CAB CALCULATE   Risk of Mortality:  16.036% Renal Failure:  15.374% Permanent Stroke:  7.885% Prolonged Ventilation:  42.824% DSW Infection:  0.568% Reoperation:  4.596% Morbidity or Mortality:  44.900% Short Length of Stay:  4.133% Long Length of Stay:  37.356%     Impression:  Patient has stage D severe  symptomatic aortic stenosis with multivessel coronary artery disease status post coronary artery bypass grafting in the remote past.  She has recently been hospitalized on 2 occasions for acute exacerbation of chronic diastolic congestive heart failure, [place] Heart Association functional class IV.  At present she describes stable symptoms of exertional fatigue, mild lower extremity edema, and shortness of breath consistent with class I-II congestive heart failure.  At present she states that she is limited more by fatigue in both lower extremities, and as a result she is not very active physically.  She denies symptoms of exertional chest pain or chest tightness.  Her clinical circumstances are further complicated by the presence of previous stroke with residual expressive aphasia, insulin-dependent type 2 diabetes mellitus with complications, including nonhealing ulcer on her right foot, stage III chronic kidney disease, and symptomatic peripheral arterial disease.  I have personally reviewed the patient's recent transthoracic echocardiogram, diagnostic cardiac catheterization, and CT angiograms.   Transthoracic  echocardiogram reveals aortic stenosis that is at least moderate and likely severe.  The aortic valve is trileaflet with severe thickening, calcification, and restricted leaflet mobility involving all 3 leaflets.  Peak velocity across aortic valve was reported 2.9 m/s but varied considerably because of the irregular RR interval caused by the patient's underlying atrial fibrillation.  The DVI was notably quite low at 0.26 and aortic valve area calculated only 0.9 cm.  Left ventricular systolic function remained reasonably well preserved.  Diagnostic cardiac catheterization confirmed the presence of severe aortic stenosis with mean transvalvular gradient directly measured 43 mmHg by catheterization.  Aortic valve area was calculated only 0.8 cm.  The patient has severe native coronary artery disease  but continued patency of the previous bypass grafts in all vascular territories.  I agree the patient would benefit from aortic valve replacement.  However, I would not consider this patient a candidate for conventional surgical aortic valve replacement with or without redo coronary artery bypass grafting because of her advanced age and numerous comorbid medical problems.  Cardiac-gated CTA of the heart reveals anatomical characteristics consistent with aortic stenosis suitable for treatment by transcatheter aortic valve replacement without any significant complicating features.  CTA of the aorta  and iliac vessels demonstrates severe peripheral arterial disease with patent iliac and femoral vessels bilaterally but marginal access due to significant calcification and atherosclerotic disease.  There is a patent stent in the right iliac artery that appears widely patient and probably is big enough to traverse with a 14 [place] delivery sheath.  There is stenosis in the left iliac artery that might be difficult to cross with a 14 [place] sheath.  There is severe stenosis of the proximal left subclavian artery.  There is mild calcification at the origin of the left common carotid artery with otherwise no significant disease.  I feel that trans-carotid approach on the left side would probably be the best choice for alternative access if transfemoral approach for TAVR is not feasible.    Plan:  The patient and her daughter were counseled at length regarding treatment alternatives for management of severe symptomatic aortic stenosis. Alternative approaches such as conventional aortic valve replacement, transcatheter aortic valve replacement, and continued medical therapy without intervention were compared and contrasted at length.  The risks associated with conventional surgical aortic valve replacement were discussed in detail, as were expectations for post-operative convalescence, and why I would be reluctant to  consider this patient a candidate for conventional surgery.  Issues specific to transcatheter aortic valve replacement were discussed including questions about long term valve durability, the potential for paravalvular leak, possible increased risk of need for permanent pacemaker placement, and other technical complications related to the procedure itself.  Long-term prognosis with medical therapy was discussed. This discussion was placed in the context of the patient's own specific clinical presentation and past medical history.  All of their questions have been addressed.  The patient desires to proceed with TAVR on 11/08/2017.  Following the decision to proceed with transcatheter aortic valve replacement, a discussion has been held regarding what types of management strategies would be attempted intraoperatively in the event of life-threatening complications, including whether or not the patient would be considered a candidate for the use of cardiopulmonary bypass and/or conversion to open sternotomy for attempted surgical intervention.  The patient would not be considered a candidate for redo median sternotomy under any circumstances.  The patient has been advised of a variety of complications that might develop including but not limited to risks of death, stroke, paravalvular leak, aortic dissection or other major vascular complications, aortic annulus rupture, device embolization, cardiac rupture or perforation, mitral regurgitation, acute myocardial infarction, arrhythmia, heart block or bradycardia requiring permanent pacemaker placement, congestive heart failure, respiratory failure, renal failure, pneumonia, infection, other late complications related to structural valve deterioration or migration, or other complications that might ultimately cause a temporary or permanent loss of functional independence or other long term morbidity.  The patient provides full informed consent for the procedure as  described and all questions were answered.  The patient has been instructed to stop taking Eliquis on Thursday November 03 2017.  She will remain on all other medications without change through the day before surgery.   I spent in excess of 90 minutes during the conduct of this office consultation and >50% of this time involved direct face-to-face encounter with the patient for counseling and/or coordination of their care.     Valentina Gu. Roxy Manns, MD 10/31/2017 4:12 PM

## 2017-10-31 NOTE — H&P (View-Only) (Signed)
[facility] [place] [facility] CONSULTATION REPORT  Referring Provider is Jacolyn Reedy, MD PCP is [facility] [place]  Chief Complaint  Patient presents with  . 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    Patient's cardiac history dates back to 1992 when she suffered an acute inferior wall myocardial infarction.  She was initially treated with PCI but she eventually underwent multivessel coronary artery bypass grafting by Dr. Cyndia Bent in 2000.  She did well for many years and has been followed chronically by Dr. [name].  She was noted to have a heart murmur on physical exam and echocardiograms have demonstrated the presence of aortic stenosis that has slowly progressed over time.  She developed atrial fibrillation and was initially treated with warfarin anticoagulation.  In 2016 she suffered a stroke.  She slowly recovered but has been left with residual expressive aphasia.  She has known history of peripheral arterial disease and has been followed by Dr. Bridgett Larsson for many years.  She had right common iliac artery stenting and  atherectomy and PTA of the left superficial femoral artery.  She describes moderately limiting symptoms of fatigue in both lower legs with ambulation that limit her mobility considerably.  She also has very poor vision and is legally blind.  This developed after cataract extraction on the left eye several years ago with previous history of a "lazy eye" on the right that was never treated during childhood.  She was hospitalized twice this spring with acute exacerbation of chronic diastolic congestive heart failure.  Echocardiogram performed Aug 26, 2017 revealed significant progression and severity of aortic stenosis with preserved left ventricular systolic function.  Ejection fraction was estimated 50 to 55%.  Peak velocity across the aortic valve was reported 2.9 m/s but varied somewhat because of the patient's underlying atrial fibrillation.  Mean transvalvular gradient was reported only 21 mmHg but the DVI was reported only 0.26 and aortic valve area calculated 0.9 cm.  The patient was referred to the multidisciplinary heart valve clinic and has been evaluated previously by Dr. Burt Knack.  Left and right heart catheterization performed October 11, 2017 revealed severe aortic stenosis with mean transvalvular gradient measured 43 mmHg by catheterization.  Aortic valve area was calculated 0.8 cm.  The patient was noted to have severe native three-vessel coronary artery disease with chronic total occlusion of the right coronary artery and the obtuse marginal branches of the left circumflex coronary artery.  There was moderate disease of the mid left anterior descending coronary artery.  All of the previous patient's bypass grafts remain patent, although the left internal mammary artery was small and somewhat atretic.  The vein graft to the large diagonal branch was widely patent and provided excellent flow throughout the entire distal left anterior descending coronary artery.  There was continued  patency of vein graft to  the posterior descending coronary artery and the obtuse marginal branch of the left circumflex coronary artery as well.  The patient subsequently underwent CT angiography and was referred for surgical consultation.  The patient is originally from [place] but has been living in the [place] for more than 30 years.  She is single having divorced her husband many years ago.  She lives alone in an apartment in [place] and is followed by [facility].  She has 3 adult children, all of whom live reasonably close to [place].  Her oldest daughter is a Marine scientist who lives in [place].  She is accompanied by her younger daughter for her office visit consultation today.  The patient does not drive an automobile because she is legally blind.  She ambulates short distances without assistance.  She is limited by poor mobility and a tendency for both lower legs get tired with any activity.  She presently describes stable symptoms of mild exertional shortness of breath that occur only with more strenuous activity.  She has never had any chest pain or chest tightness.  At the time of her recent hospitalizations this spring she had resting shortness of breath and severe lower extremity edema.  She takes insulin at home and checks her blood sugars daily.  Glycemic control has been improved recently.  Past Medical History:  Diagnosis Date  . Anemia   . CAD (coronary artery disease)    a. s/p CABG in 2000  . Carotid artery occlusion   . CKD (chronic kidney disease)   . Diastolic CHF ([place]) 58/5277  . Fibromyalgia   . GERD (gastroesophageal reflux disease)   . History of GI bleed   . Hyperlipidemia   . Hypertension   . Obesity   . PAF (paroxysmal atrial fibrillation) ([place])    a. on Xarelto.   . Peripheral vascular disease ([place])   . Stroke ([place]) 05/24/2014  . Type II diabetes mellitus ([place]) dx'd 1979    Past Surgical History:  Procedure Laterality Date  . ABDOMINAL AORTAGRAM N/A  04/01/2011   Procedure: ABDOMINAL AORTAGRAM;  Surgeon: Conrad Parcelas Penuelas, MD;  Location: [facility] CATH LAB;  Service: Cardiovascular;  Laterality: N/A;  . ANGIOPLASTY  06/17/11   Left leg common femoral artery cannulation under u/s Left leg runoff  . CARDIAC CATHETERIZATION  10/11/2017  . CARPAL TUNNEL RELEASE Right   . CATARACT EXTRACTION W/ INTRAOCULAR LENS  IMPLANT, BILATERAL  2004-2005  . CORONARY ARTERY BYPASS GRAFT  2000   CABG X5  . EYE SURGERY Left    "lasered before cataract OR"  . LOWER EXTREMITY ANGIOGRAM Bilateral 04/01/2011   Procedure: LOWER EXTREMITY ANGIOGRAM;  Surgeon: Conrad Clayton, MD;  Location: [facility] CATH LAB;  Service: Cardiovascular;  Laterality: Bilateral;  . LOWER EXTREMITY ANGIOGRAM Left 06/17/2011   Procedure: LOWER EXTREMITY ANGIOGRAM;  Surgeon: Conrad Brentwood, MD;  Location: [facility] CATH LAB;  Service: Cardiovascular;  Laterality: Left;  . LOWER EXTREMITY ANGIOGRAM N/A 11/18/2011   Procedure: LOWER EXTREMITY ANGIOGRAM;  Surgeon: Conrad Rolla, MD;  Location: [facility] CATH LAB;  Service: Cardiovascular;  Laterality: N/A;  . PERCUTANEOUS STENT INTERVENTION Right 04/01/2011   Procedure: PERCUTANEOUS STENT INTERVENTION;  Surgeon: Conrad , MD;  Location: [facility] CATH LAB;  Service: Cardiovascular;  Laterality: Right;  rt iliac stent  . PERIPHERAL ARTERIAL STENT GRAFT  04/01/11   right common iliac  . RIGHT/LEFT HEART CATH AND CORONARY/GRAFT ANGIOGRAPHY N/A 10/11/2017   Procedure: RIGHT/LEFT  HEART CATH AND CORONARY/GRAFT ANGIOGRAPHY;  Surgeon: Sherren Mocha, MD;  Location: [facility] CV LAB;  Service: Cardiovascular;  Laterality: N/A;  . TRIGGER FINGER RELEASE Left 1996   thumb    Family History  Problem Relation Age of Onset  . Other Brother        intestinal blockage  . Diabetes Brother     Social History   Socioeconomic History  . Marital status: Divorced    Spouse name: Not on file  . Number of children: 3  . Years of education: 73  . Highest education level: Not on file    Occupational History  . Occupation: Retired  Scientific laboratory technician  . Financial resource strain: Not on file  . Food insecurity:    Worry: Not on file    Inability: Not on file  . Transportation needs:    Medical: Not on file    Non-medical: Not on file  Tobacco Use  . Smoking status: Former Smoker    Packs/day: 2.00    Years: 30.00    Pack years: 60.00    Types: Cigarettes    Last attempt to quit: 04/19/1990    Years since quitting: 27.5  . Smokeless tobacco: Never Used  . Tobacco comment: stopped smoking cigarettes 1991  Substance and Sexual Activity  . Alcohol use: Not Currently    Alcohol/week: 0.0 oz    Comment: "tried different alcohols when I was 1st married; never drank much at  ALL"  . Drug use: Never  . Sexual activity: Not Currently  Lifestyle  . Physical activity:    Days per week: Not on file    Minutes per session: Not on file  . Stress: Not on file  Relationships  . Social connections:    Talks on phone: Not on file    Gets together: Not on file    Attends religious service: Not on file    Active member of club or organization: Not on file    Attends meetings of clubs or organizations: Not on file    Relationship status: Not on file  . Intimate partner violence:    Fear of current or ex partner: Not on file    Emotionally abused: Not on file    Physically abused: Not on file    Forced sexual activity: Not on file  Other Topics Concern  . Not on file  Social History Narrative   Divorced.  Native of [place].  Formerly worked as Education administrator person. 01/13/17 lives alone   Caffeine use: drinks decaf tea and coffee    Current Outpatient Medications  Medication Sig Dispense Refill  . acetaminophen (TYLENOL) 325 MG tablet Take 650 mg by mouth every 6 (six) hours as needed for headache.    Marland Kitchen amLODipine (NORVASC) 10 MG tablet Take 10 mg by mouth every evening.     Marland Kitchen apixaban (ELIQUIS) 5 MG TABS tablet Take 1 tablet (5 mg total) by mouth 2 (two) times daily. 60 tablet 0   . atorvastatin (LIPITOR) 80 MG tablet Take 1 tablet (80 mg total) by mouth daily. (Patient taking differently: Take 80 mg by mouth at bedtime. ) 30 tablet 0  . furosemide (LASIX) 40 MG tablet Take 40 mg by mouth 2 (two) times daily.  0  . glucose 4 GM chewable tablet Chew 4 tablets by mouth once as needed for low blood sugar.     Marland Kitchen HUMULIN R 100 UNIT/ML injection Inject 5 units subcutaneously BEFORE BREAKFAST AND DINNER  0  .  insulin NPH Human (HUMULIN N,NOVOLIN N) 100 UNIT/ML injection Inject 0.15 mLs (15 Units total) into the skin 2 (two) times daily. 15 units in the morning and 10 units in the PM 10 mL 11  . losartan (COZAAR) 50 MG tablet Take 1 tablet (50 mg total) by mouth daily. (Patient taking differently: Take 50 mg by mouth 2 (two) times daily. )    . metoprolol tartrate (LOPRESSOR) 25 MG tablet Take 25 mg by mouth 2 (two) times daily.     . Multiple Vitamins-Minerals (ONE-A-DAY 50 PLUS PO) Take 1 tablet by mouth daily.    Marland Kitchen NITROSTAT 0.4 MG SL tablet Place 1 tablet under the tongue every 5 (five) minutes x 3 doses as needed for chest pain. Chest pain  1   No current facility-administered medications for this visit.     Allergies  Allergen Reactions  . Morphine And Related Nausea And Vomiting    Chest pain    . Lisinopril Cough  . Morphine   . Sertraline Hcl Nausea Only    [name]  . Zoloft [Sertraline Hcl] Nausea Only           Review of Systems:   General:  Normal appetite, decreased energy, no weight gain, no weight loss, no fever  Cardiac:  no chest pain with exertion, no chest pain at rest, +SOB with exertion, no resting SOB, no PND, + orthopnea, no palpitations, + arrhythmia, + atrial fibrillation, + LE edema, no dizzy spells, no syncope  Respiratory:  no shortness of breath, no home oxygen, no productive cough, no dry cough, no bronchitis, no wheezing, no hemoptysis, no asthma, no pain with inspiration or cough, no sleep apnea, no CPAP at night  GI:   no  difficulty swallowing, no reflux, no frequent heartburn, no hiatal hernia, no abdominal pain, + constipation, no diarrhea, no hematochezia, no hematemesis, no melena  GU:   no dysuria,  no frequency, no urinary tract infection, no hematuria, no kidney stones, + kidney disease  Vascular:  no pain suggestive of claudication, no pain in feet, no leg cramps, no varicose veins, no DVT, + non-healing foot ulcer  Neuro:   + stroke, no TIA's, no seizures, no headaches, no temporary blindness one eye,  no slurred speech, + peripheral neuropathy, no chronic pain, + instability of gait, + memory/cognitive dysfunction  Musculoskeletal: + arthritis, no joint swelling, no myalgias, + some difficulty walking, decreased mobility   Skin:   no rash, no itching, + skin infections, no pressure sores or ulcerations  Psych:   no anxiety, no depression, no nervousness, no unusual recent stress  Eyes:   + blurry vision, no floaters, no recent vision changes, does not wear glasses or contacts  ENT:   no hearing loss, no loose or painful teeth, edentulous with full dentures, last saw dentist 2018  Hematologic:  + easy bruising, no abnormal bleeding, no clotting disorder, no frequent epistaxis  Endocrine:   diabetes, does check CBG's at home           Physical Exam:   BP (!) 129/56 (BP Location: Left Arm, Patient Position: Sitting, Cuff Size: Large)   Pulse 72   Resp 16   Ht 5\' 6"  (1.676 m)   Wt 210 lb (95.3 kg)   SpO2 94% Comment: RA  BMI 33.89 kg/m   General:  Obese female NAD  HEENT:  Unremarkable   Neck:   no JVD, no bruits, no adenopathy   Chest:  clear to auscultation, symmetrical breath sounds, no wheezes, no rhonchi  CV:   Irregular rate and rhythm, grade III/VI crescendo/decrescendo murmur heard best at LLSB,  no diastolic murmur  Abdomen:  soft, non-tender, no masses   Extremities:  warm, well-perfused, pulses not palpable, no LE edema  Rectal/GU  Deferred  Neuro:   Grossly non-focal and  symmetrical throughout  Skin:   Clean and dry, no rashes, no breakdown   Diagnostic Tests:  Transthoracic Echocardiography  Patient:    Kristina Summers, Kristina Summers MR #:       [id number] Study Date: 08/26/2017 Gender:     F Age:        10 Height:     167.6 cm Weight:     94.8 kg BSA:        2.14 m^2 Pt. Status: Room:       [place], [place]  PERFORMING   [facility], Inpatient  SONOGRAPHER  Jannett Celestine, RDCS  ORDERING     Vladimir Faster  Ephriam Jenkins  cc:  ------------------------------------------------------------------- LV EF: 50% -   55%  ------------------------------------------------------------------- History:   PMH:  CHF - Acute Diastolic 409.31.  Atrial fibrillation.  Coronary artery disease.  Congestive heart failure. Stroke.  PMH:   Myocardial infarction.  Risk factors: Hypertension. Diabetes mellitus. Dyslipidemia.  ------------------------------------------------------------------- Study Conclusions  - Left ventricle: The cavity size was normal. Wall thickness was   increased in a pattern of mild LVH. Systolic function was normal.   The estimated ejection fraction was in the range of 50% to 55%.   Suspected diastolic dysfunction and elevated LV filling pressure   (a-fib is noted). - Aortic valve: Calcified with restricted leaflet motion. There is   likely severe stenosis. Trivial regurgitation. Mean gradient (S):   21 mm Hg. Peak gradient (S): 34 mm Hg. Valve area (VTI): 0.9   cm^2. Valve area (Vmax): 0.81 cm^2. Valve area (Vmean): 0.83   cm^2. - Mitral valve: Calcified annulus. Mildly thickened leaflets . - Left atrium: Severely dilated. - Right ventricle: The cavity size was mildly dilated. Moderately   reduced systolic function. - Right atrium: The atrium was mildly dilated. - Inferior vena cava: The vessel was dilated. The respirophasic   diameter changes were blunted (< 50%),  consistent with elevated   central venous pressure.  Impressions:  - Compared to a prior study in 01/2017, the LVEF is unchanged.   There is now likely severe aortic stenosis with an AVA around 0.9   cm2, there is severe LAE and diastolic dysfunction with elevated   LV filling pressure.  ------------------------------------------------------------------- Study data:  Comparison was made to the study of 01/28/2017.  Study status:  Routine.  Procedure:  The patient reported no pain pre or post test. Transthoracic echocardiography. Image quality was adequate. The study was technically difficult, as a result of body habitus.  Study completion:  There were no complications. Transthoracic echocardiography.  M-mode, complete 2D, spectral Doppler, and color Doppler.  Birthdate:  Patient birthdate: 11/19/1935.  Age:  Patient is 82 yr old.  Sex:  Gender: female. BMI: 33.7 kg/m^2.  Blood pressure:     139/70  Patient status: Inpatient.  Study date:  Study date: 08/26/2017. Study time: 09:11 AM.  Location:  Bedside.  -------------------------------------------------------------------  ------------------------------------------------------------------- Left ventricle:  The cavity size was normal. Wall thickness was increased in a pattern of mild LVH. Systolic function was normal.  The estimated ejection fraction was in the range of 50% to 55%. Suspected diastolic dysfunction and elevated LV filling pressure (a-fib is noted).  ------------------------------------------------------------------- Aortic valve:  Calcified with restricted leaflet motion. There is likely severe stenosis. Trivial regurgitation.  Doppler:     VTI ratio of LVOT to aortic valve: 0.29. Valve area (VTI): 0.9 cm^2. Indexed valve area (VTI): 0.42 cm^2/m^2. Peak velocity ratio of LVOT to aortic valve: 0.26. Valve area (Vmax): 0.81 cm^2. Indexed valve area (Vmax): 0.38 cm^2/m^2. Mean velocity ratio of LVOT to aortic  valve: 0.26. Valve area (Vmean): 0.83 cm^2. Indexed valve area (Vmean): 0.39 cm^2/m^2.    Mean gradient (S): 21 mm Hg. Peak gradient (S): 34 mm Hg.  ------------------------------------------------------------------- Aorta:  Aortic root: The aortic root was normal in size. Ascending aorta: The ascending aorta was normal in size.  ------------------------------------------------------------------- Mitral valve:   Calcified annulus. Mildly thickened leaflets . Doppler:  There was trivial regurgitation.    Peak gradient (D): 6 mm Hg.  ------------------------------------------------------------------- Left atrium:  Severely dilated.  ------------------------------------------------------------------- Atrial septum:  No defect or patent foramen ovale was identified.   ------------------------------------------------------------------- Right ventricle:  The cavity size was mildly dilated. Moderately reduced systolic function.  ------------------------------------------------------------------- Pulmonic valve:   Poorly visualized.  Doppler:  There was no significant regurgitation.  ------------------------------------------------------------------- Tricuspid valve:   Doppler:  There was no significant regurgitation.  ------------------------------------------------------------------- Pulmonary artery:   Poorly visualized.  ------------------------------------------------------------------- Right atrium:  The atrium was mildly dilated.  ------------------------------------------------------------------- Pericardium:  There was no pericardial effusion.  ------------------------------------------------------------------- Systemic veins: Inferior vena cava: The vessel was dilated. The respirophasic diameter changes were blunted (< 50%), consistent with elevated central venous  pressure.  ------------------------------------------------------------------- Measurements   Left ventricle                           Value          Reference  LV ID, ED, PLAX chordal          (H)     55    mm       43 - 52  LV ID, ES, PLAX chordal          (H)     41    mm       23 - 38  LV fx shortening, PLAX chordal   (L)     25    %        >=29  LV PW thickness, ED                      13    mm       ----------  IVS/LV PW ratio, ED                      0.85           <=1.3  Stroke volume, 2D                        61    ml       ----------  Stroke volume/bsa, 2D                    29    ml/m^2   ----------    Ventricular septum                       Value  Reference  IVS thickness, ED                        11    mm       ----------    LVOT                                     Value          Reference  LVOT ID, S                               20    mm       ----------  LVOT area                                3.14  cm^2     ----------  LVOT peak velocity, S                    75.2  cm/s     ----------  LVOT mean velocity, S                    56    cm/s     ----------  LVOT VTI, S                              19.3  cm       ----------  Stroke volume (SV), LVOT DP              60.6  ml       ----------  Stroke index (SV/bsa), LVOT DP           28.4  ml/m^2   ----------    Aortic valve                             Value          Reference  Aortic valve peak velocity, S            292   cm/s     ----------  Aortic valve mean velocity, S            213   cm/s     ----------  Aortic valve VTI, S                      67    cm       ----------  Aortic mean gradient, S                  21    mm Hg    ----------  Aortic peak gradient, S                  34    mm Hg    ----------  VTI ratio, LVOT/AV                       0.29           ----------  Aortic valve area, VTI                   0.9  cm^2     ----------  Aortic valve area/bsa, VTI               0.42  cm^2/m^2  ----------  Velocity ratio, peak, LVOT/AV            0.26           ----------  Aortic valve area, peak velocity         0.81  cm^2     ----------  Aortic valve area/bsa, peak              0.38  cm^2/m^2 ----------  velocity  Velocity ratio, mean, LVOT/AV            0.26           ----------  Aortic valve area, mean velocity         0.83  cm^2     ----------  Aortic valve area/bsa, mean              0.39  cm^2/m^2 ----------  velocity    Aorta                                    Value          Reference  Aortic root ID, ED                       32    mm       ----------    Left atrium                              Value          Reference  LA ID, A-P, ES                           45    mm       ----------  LA ID/bsa, A-P                           2.11  cm/m^2   <=2.2  LA volume, S                             103   ml       ----------  LA volume/bsa, S                         48.2  ml/m^2   ----------  LA volume, ES, 1-p A4C                   121   ml       ----------  LA volume/bsa, ES, 1-p A4C               56.6  ml/m^2   ----------  LA volume, ES, 1-p A2C                   85.5  ml       ----------  LA volume/bsa, ES, 1-p A2C               40    ml/m^2   ----------    Mitral valve  Value          Reference  Mitral E-wave peak velocity              126   cm/s     ----------  Mitral deceleration time                 162   ms       150 - 230  Mitral peak gradient, D                  6     mm Hg    ----------    Right atrium                             Value          Reference  RA ID, S-I, ES, A4C              (H)     66.8  mm       34 - 49  RA area, ES, A4C                 (H)     20.9  cm^2     8.3 - 19.5  RA volume, ES, A/L                       55.2  ml       ----------  RA volume/bsa, ES, A/L                   25.8  ml/m^2   ----------    Right ventricle                          Value          Reference  RV ID, minor axis, ED, A4C base          35    mm        ----------  RV ID, minor axis, ED, A4C mid           34    mm       ----------  RV ID, major axis, ED, A4C               63    mm       55 - 91  TAPSE                                    11.5  mm       ----------  RV s&', lateral, S                        5.98  cm/s     ----------  Legend: (L)  and  (H)  mark values outside specified reference range.  ------------------------------------------------------------------- Prepared and Electronically Authenticated by  Lyman Bishop MD 2019-05-10T12:14:35    RIGHT/LEFT HEART CATH AND CORONARY/GRAFT ANGIOGRAPHY  Conclusion     Ost 1st Mrg lesion is 100% stenosed.  Ost 2nd Mrg lesion is 100% stenosed.  Prox RCA lesion is 100% stenosed.  Prox LAD to Mid LAD lesion is 70% stenosed.   1.  Severe native vessel coronary artery disease with total occlusion of the RCA and OM branches left circumflex, moderately  severe mid LAD stenosis 2.  Status post CABG with continued patency of the SVG to PDA, SVG to OM, SVG to diagonal, and LIMA to LAD 3.  Severe aortic stenosis with a mean transvalvular gradient of 43 mmHg and calculated aortic valve area of 0.8 cm.  Recommendation: Continued multidisciplinary heart team evaluation for treatment of aortic stenosis/TAVR evaluation   Indications   Severe aortic stenosis [I35.0 (ICD-10-CM)]  Procedural Details/Technique   Technical Details INDICATION: Severe aortic stenosis, progressive dyspnea, recurrent diastolic heart failure.  PROCEDURAL DETAILS: There was an indwelling IV in a right antecubital vein. Using normal sterile technique, the IV was changed out for a 5 Fr brachial sheath over a 0.018 inch wire. The left wrist was then prepped, draped, and anesthetized with 1% lidocaine. Ultrasound guidance is used. The left radial artery is very small. Attempts were made to cannulate the vessel but this is unsuccessful. Attention was then turned to the right groin. Ultrasound guidance was used  to assess the right femoral artery. Using direct ultrasound access a 5 French sheath was inserted into the right femoral artery. Ultrasound images are captured and stored in the patient's paper chart. A Swan-Ganz catheter was used for the right heart catheterization. Standard protocol was followed for recording of right heart pressures and sampling of oxygen saturations. Fick cardiac output was calculated. Standard Judkins catheters were used for selective coronary angiography and bypass angiography. An AL-1 catheter was used to direct a straight tip wire across the aortic valve. LV pressure is sampled and a pullback gradient is recorded across the aortic valve. There were no immediate procedural complications. The patient was transferred to the post catheterization recovery area for further monitoring.     Estimated blood loss <50 mL.  During this procedure the patient was administered the following to achieve and maintain moderate conscious sedation: Versed 1 mg, Fentanyl 25 mcg, while the patient's heart rate, blood pressure, and oxygen saturation were continuously monitored. The period of conscious sedation was 53 minutes, of which I was present face-to-face 100% of this time.  Coronary Findings   Diagnostic  Dominance: Right  Left Main  There is mild diffuse disease throughout the vessel.  Left Anterior Descending  Prox LAD to Mid LAD lesion 70% stenosed  Prox LAD to Mid LAD lesion is 70% stenosed.  Ramus Intermedius  Patent, divides into twin vessels  Left Circumflex  First Obtuse Marginal Branch  Ost 1st Mrg lesion 100% stenosed  Ost 1st Mrg lesion is 100% stenosed.  Second Obtuse Marginal Branch  Ost 2nd Mrg lesion 100% stenosed  Ost 2nd Mrg lesion is 100% stenosed.  Right Coronary Artery  Prox RCA lesion 100% stenosed  Prox RCA lesion is 100% stenosed.  Graft to RPDA  The saphenous vein graft to PDA is widely patent.  Graft to 1st Mrg  The saphenous vein graft to first obtuse  marginal is widely patent  Graft to Ost 1st Diag  The saphenous vein graft to first diagonal is widely patent with minimal irregularities. The LAD fills primarily from both native flow and retrograde flow from this graft.  LIMA Graft to Mid LAD  Patent graft. Imaged nonselectively from the left subclavian artery. Not well visualized distally.  Intervention   No interventions have been documented.  Coronary Diagrams   Diagnostic Diagram       Implants    No implant documentation for this case.  MERGE Images   Show images for CARDIAC CATHETERIZATION   Link to Procedure Log  Procedure Log    Hemo Data    Most Recent Value  Fick Cardiac Output 6.92 L/min  Fick Cardiac Output Index 3.44 (L/min)/BSA  Aortic Mean Gradient 42.9 mmHg  Aortic Peak Gradient 39 mmHg  Aortic Valve Area 0.81  Aortic Value Area Index 0.4 cm2/BSA  RA A Wave 8 mmHg  RA V Wave 11 mmHg  RA Mean 9 mmHg  RV Systolic Pressure 42 mmHg  RV Diastolic Pressure 3 mmHg  RV EDP 7 mmHg  PA Systolic Pressure 45 mmHg  PA Diastolic Pressure 19 mmHg  PA Mean 30 mmHg  PW A Wave 33 mmHg  PW V Wave 36 mmHg  PW Mean 26 mmHg  AO Systolic Pressure 242 mmHg  AO Diastolic Pressure 53 mmHg  AO Mean 93 mmHg  LV Systolic Pressure 353 mmHg  LV Diastolic Pressure 16 mmHg  LV EDP 22 mmHg  AOp Systolic Pressure 614 mmHg  AOp Diastolic Pressure 52 mmHg  AOp Mean Pressure 90 mmHg  LVp Systolic Pressure 431 mmHg  LVp Diastolic Pressure 14 mmHg  LVp EDP Pressure 20 mmHg  QP/QS 1  TPVR Index 8.71 HRUI  TSVR Index 27.02 HRUI  PVR SVR Ratio 0.05  TPVR/TSVR Ratio 0.32     Cardiac TAVR CT  TECHNIQUE: The patient was scanned on a [employer]. A 120 kV retrospective scan was triggered in the descending thoracic aorta at 111 HU's. Gantry rotation speed was 250 msecs and collimation was .6 mm. No beta blockade or nitro were given. The 3D data set was reconstructed in 5% intervals of the R-R cycle. Systolic  and diastolic phases were analyzed on a dedicated work station using MPR, MIP and VRT modes. The patient received 80 cc of contrast.  FINDINGS: Aortic Valve: Trileaflet, with severely calcified and thickened leaflets and no calcifications extending into the LVOT.  Aorta: Normal size, moderate diffuse calcifications and atheroma in the aortic arch and descending thoracic aorta.  Sinotubular Junction: 28 x 27 mm  Ascending Thoracic Aorta: 33 x 30 mm  Aortic Arch: 26 x 23 mm  Descending Thoracic Aorta: 23 x 22 mm  Sinus of Valsalva Measurements:  Non-coronary: 34  mm  Right -coronary: 31 mm  Left -coronary: 32 mm  Coronary Artery Height above Annulus:  Left Main: 13 mm  Right Coronary: 19 mm  Virtual Basal Annulus Measurements:  Maximum/Minimum Diameter: 29.2 x 22.6 mm  Mean Diameter: 24.9 mm  Perimeter: 80.8 mm  Area: 487 mm 2  Optimum Fluoroscopic Angle for Delivery: LAO 11 CAU 11  IMPRESSION: 1. Trileaflet, with severely calcified and thickened leaflets and no calcifications extending into the LVOT. Annular measurements suitable for delivery of a 26 mm Edwards-SAPIEN 3 valve.  2. Sufficient coronary to annulus distance.  3. Optimum Fluoroscopic Angle for Delivery: LAO 11 CAU 11  4. No thrombus in the left atrial appendage (on delayed imaging).   Electronically Signed   By: Ena Dawley   On: 10/22/2017 13:55    CT ANGIOGRAPHY CHEST, ABDOMEN AND PELVIS  TECHNIQUE: Multidetector CT imaging through the chest, abdomen and pelvis was performed using the standard protocol during bolus administration of intravenous contrast. Multiplanar reconstructed images and MIPs were obtained and reviewed to evaluate the vascular anatomy.  CONTRAST:  120mL ISOVUE-370 IOPAMIDOL (ISOVUE-370) INJECTION 76%  COMPARISON:  Chest CT 08/25/2017.  FINDINGS: CTA CHEST FINDINGS  Cardiovascular: Cardiomegaly with left atrial dilatation.  Filling defect in the tip of the left atrial appendage (axial image 51 of series 14),  concerning for potential thrombus although this may alternatively represent pseudo thrombus. There is aortic atherosclerosis, as well as atherosclerosis of the great vessels of the mediastinum and the coronary arteries, including calcified atherosclerotic plaque in the left main, left anterior descending, left circumflex and right coronary arteries. Status post median sternotomy for CABG including LIMA to the LAD. Severe thickening calcification of the aortic valve. Calcifications of the mitral annulus.  Mediastinum/Lymph Nodes: No pathologically enlarged mediastinal or hilar lymph nodes. Esophagus is unremarkable in appearance. No axillary lymphadenopathy.  Lungs/Pleura: No suspicious appearing pulmonary nodules or masses. No acute consolidative airspace disease. No pleural effusions.  Musculoskeletal/Soft Tissues: There are no aggressive appearing lytic or blastic lesions noted in the visualized portions of the skeleton. Median sternotomy wires.  CTA ABDOMEN AND PELVIS FINDINGS  Hepatobiliary: Liver has a slightly shrunken appearance and nodular contour, indicative of underlying cirrhosis. No definite cystic or solid hepatic lesions. No intra or extrahepatic biliary ductal dilatation. Several small calcified gallstones lying dependently in the gallbladder. No surrounding inflammatory changes to suggest an acute cholecystitis at this time.  Pancreas: No pancreatic mass. No pancreatic ductal dilatation. No pancreatic or peripancreatic fluid or inflammatory changes.  Spleen: Unremarkable.  Adrenals/Urinary Tract: Bilateral kidneys and bilateral adrenal glands are normal in appearance. No hydroureteronephrosis. Small amount of gas non dependently in the lumen of the urinary bladder. Urinary bladder is otherwise unremarkable in appearance.  Stomach/Bowel: Normal appearance of stomach.  No pathologic dilatation of small bowel or colon. Numerous colonic diverticulae are noted, without surrounding inflammatory changes to suggest an acute diverticulitis at this time. Normal appendix.  Vascular/Lymphatic: Aortic atherosclerosis, without evidence of aneurysm or dissection in the abdominal or pelvic vasculature. Vascular findings and measurements pertinent to potential TAVR procedure, as detailed below. Celiac axis and inferior mesenteric artery are widely patent without hemodynamically significant stenosis. Atherosclerosis at the ostium of the superior mesenteric artery with what appears to be mild to moderate stenosis. Atherosclerosis at the ostium of the single right renal artery with mild to moderate stenosis. Left renal artery appears patent without hemodynamically significant stenosis. No lymphadenopathy noted in the abdomen or pelvis.  Reproductive: Uterus and ovaries are unremarkable in appearance.  Other: No significant volume of ascites.  No pneumoperitoneum.  Musculoskeletal: There are no aggressive appearing lytic or blastic lesions noted in the visualized portions of the skeleton.  VASCULAR MEASUREMENTS PERTINENT TO TAVR:  AORTA:  Minimal Aortic Diameter-12 x 10 mm  Severity of Aortic Calcification-severe  RIGHT PELVIS:  Right Common Iliac Artery - Indwelling stent  Minimal Diameter-4.9 x 4.1 mm  Tortuosity-mild  Calcification-severe  Right External Iliac Artery -  Minimal Diameter-4.4 x 4.6 mm  Tortuosity - mild  Calcification-moderate  Right Common Femoral Artery -  Minimal Diameter-5.8 x 6.6 mm  Tortuosity - mild  Calcification-moderate  LEFT PELVIS:  Left Common Iliac Artery -  Minimal Diameter-7.3 x 4.5 mm  Tortuosity - mild  Calcification-severe  Left External Iliac Artery -  Minimal Diameter-5.7 x 4.8 mm  Tortuosity - mild  Calcification-moderate  Left Common Femoral Artery  -  Minimal Diameter-8.1 x 6.2 mm  Tortuosity - mild  Calcification-severe  Review of the MIP images confirms the above findings.  IMPRESSION: 1. Vascular findings and measurements pertinent to potential TAVR procedure, as detailed above. 2. Severe thickening calcification of the aortic valve, compatible with the reported clinical history of aortic stenosis. 3. Cardiomegaly with left atrial dilatation and filling defect in the tip of the left atrial appendage concerning for potential  left atrial appendage thrombus. Attention at time of forthcoming transesophageal echocardiography is recommended. 4. Aortic atherosclerosis, in addition to left main and 3 vessel coronary artery disease. Status post median sternotomy for CABG including LIMA to the LAD. 5. Small amount of gas non dependently in the lumen of the urinary bladder. Statistically, this is likely iatrogenic from recent catheterization, however, if there is no recent history of catheterization, correlation with urinalysis would be recommended to exclude the possibility of urinary tract infection with gas-forming organisms. 6. Additional incidental findings, as above.   Electronically Signed   By: Vinnie Langton M.D.   On: 10/21/2017 16:54   STS Risk Calculator  Procedure: Isolated AVR CALCULATE   Risk of Mortality:  5.725% Renal Failure:  4.977% Permanent Stroke:  4.792% Prolonged Ventilation:  21.084% DSW Infection:  0.518% Reoperation:  2.825% Morbidity or Mortality:  27.010% Short Length of Stay:  7.976% Long Length of Stay:  21.230%    Procedure: AVR + CAB CALCULATE   Risk of Mortality:  16.036% Renal Failure:  15.374% Permanent Stroke:  7.885% Prolonged Ventilation:  42.824% DSW Infection:  0.568% Reoperation:  4.596% Morbidity or Mortality:  44.900% Short Length of Stay:  4.133% Long Length of Stay:  37.356%     Impression:  Patient has stage D severe  symptomatic aortic stenosis with multivessel coronary artery disease status post coronary artery bypass grafting in the remote past.  She has recently been hospitalized on 2 occasions for acute exacerbation of chronic diastolic congestive heart failure, [place] Heart Association functional class IV.  At present she describes stable symptoms of exertional fatigue, mild lower extremity edema, and shortness of breath consistent with class I-II congestive heart failure.  At present she states that she is limited more by fatigue in both lower extremities, and as a result she is not very active physically.  She denies symptoms of exertional chest pain or chest tightness.  Her clinical circumstances are further complicated by the presence of previous stroke with residual expressive aphasia, insulin-dependent type 2 diabetes mellitus with complications, including nonhealing ulcer on her right foot, stage III chronic kidney disease, and symptomatic peripheral arterial disease.  I have personally reviewed the patient's recent transthoracic echocardiogram, diagnostic cardiac catheterization, and CT angiograms.   Transthoracic  echocardiogram reveals aortic stenosis that is at least moderate and likely severe.  The aortic valve is trileaflet with severe thickening, calcification, and restricted leaflet mobility involving all 3 leaflets.  Peak velocity across aortic valve was reported 2.9 m/s but varied considerably because of the irregular RR interval caused by the patient's underlying atrial fibrillation.  The DVI was notably quite low at 0.26 and aortic valve area calculated only 0.9 cm.  Left ventricular systolic function remained reasonably well preserved.  Diagnostic cardiac catheterization confirmed the presence of severe aortic stenosis with mean transvalvular gradient directly measured 43 mmHg by catheterization.  Aortic valve area was calculated only 0.8 cm.  The patient has severe native coronary artery disease  but continued patency of the previous bypass grafts in all vascular territories.  I agree the patient would benefit from aortic valve replacement.  However, I would not consider this patient a candidate for conventional surgical aortic valve replacement with or without redo coronary artery bypass grafting because of her advanced age and numerous comorbid medical problems.  Cardiac-gated CTA of the heart reveals anatomical characteristics consistent with aortic stenosis suitable for treatment by transcatheter aortic valve replacement without any significant complicating features.  CTA of the aorta  and iliac vessels demonstrates severe peripheral arterial disease with patent iliac and femoral vessels bilaterally but marginal access due to significant calcification and atherosclerotic disease.  There is a patent stent in the right iliac artery that appears widely patient and probably is big enough to traverse with a 14 [place] delivery sheath.  There is stenosis in the left iliac artery that might be difficult to cross with a 14 [place] sheath.  There is severe stenosis of the proximal left subclavian artery.  There is mild calcification at the origin of the left common carotid artery with otherwise no significant disease.  I feel that trans-carotid approach on the left side would probably be the best choice for alternative access if transfemoral approach for TAVR is not feasible.    Plan:  The patient and her daughter were counseled at length regarding treatment alternatives for management of severe symptomatic aortic stenosis. Alternative approaches such as conventional aortic valve replacement, transcatheter aortic valve replacement, and continued medical therapy without intervention were compared and contrasted at length.  The risks associated with conventional surgical aortic valve replacement were discussed in detail, as were expectations for post-operative convalescence, and why I would be reluctant to  consider this patient a candidate for conventional surgery.  Issues specific to transcatheter aortic valve replacement were discussed including questions about long term valve durability, the potential for paravalvular leak, possible increased risk of need for permanent pacemaker placement, and other technical complications related to the procedure itself.  Long-term prognosis with medical therapy was discussed. This discussion was placed in the context of the patient's own specific clinical presentation and past medical history.  All of their questions have been addressed.  The patient desires to proceed with TAVR on 11/08/2017.  Following the decision to proceed with transcatheter aortic valve replacement, a discussion has been held regarding what types of management strategies would be attempted intraoperatively in the event of life-threatening complications, including whether or not the patient would be considered a candidate for the use of cardiopulmonary bypass and/or conversion to open sternotomy for attempted surgical intervention.  The patient would not be considered a candidate for redo median sternotomy under any circumstances.  The patient has been advised of a variety of complications that might develop including but not limited to risks of death, stroke, paravalvular leak, aortic dissection or other major vascular complications, aortic annulus rupture, device embolization, cardiac rupture or perforation, mitral regurgitation, acute myocardial infarction, arrhythmia, heart block or bradycardia requiring permanent pacemaker placement, congestive heart failure, respiratory failure, renal failure, pneumonia, infection, other late complications related to structural valve deterioration or migration, or other complications that might ultimately cause a temporary or permanent loss of functional independence or other long term morbidity.  The patient provides full informed consent for the procedure as  described and all questions were answered.  The patient has been instructed to stop taking Eliquis on Thursday November 03 2017.  She will remain on all other medications without change through the day before surgery.   I spent in excess of 90 minutes during the conduct of this office consultation and >50% of this time involved direct face-to-face encounter with the patient for counseling and/or coordination of their care.     Valentina Gu. Roxy Manns, MD 10/31/2017 4:12 PM

## 2017-11-01 ENCOUNTER — Encounter: Payer: Self-pay | Admitting: Surgery

## 2017-11-03 NOTE — Pre-Procedure Instructions (Signed)
Kristina Summers  11/03/2017      [id number] [address], [address] [place] [address] [place] [address] Phone: [phone] Fax: [phone]    Your procedure is scheduled on November 08, 2017.  Report to [facility] Admitting at 530 AM.  Call this number if you have problems the morning of surgery:  [phone]   Remember:  Do not eat or drink after midnight.   Eliquis to be held beginning 10/31/17 per your surgeon's instructions    Take these medicines the morning of surgery with A SIP OF WATER (none)  7 days prior to surgery STOP taking any Aspirin (unless otherwise instructed by your surgeon), Aleve, Naproxen, Ibuprofen, Motrin, Advil, Goody's, BC's, all herbal medications, fish oil, and all vitamins    WHAT DO I DO ABOUT MY DIABETES MEDICATION?   Marland Kitchen Do not take oral diabetes medicines (pills) the morning of surgery.  . THE NIGHT BEFORE SURGERY, take 7 units of NPH insulin (1/2 of your normal dose) and your regular dose of regular insulin with dinner.       . THE MORNING OF SURGERY, take 7 units of NPH insulin (1/2 of your normal dose) and no regular insulin unless, your CBG is greater than 220 mg/dL, then you may take  of your sliding scale (correction) dose of insulin(2 units).  . The day of surgery, do not take other diabetes injectables, including Byetta (exenatide), Bydureon (exenatide ER), Victoza (liraglutide), or Trulicity (dulaglutide).  . If your CBG is greater than 220 mg/dL, you may take  of your sliding scale (correction) dose of insulin.   Reviewed and Endorsed by [facility] Patient Education Committee, August 2015  How to Manage Your Diabetes Before and After Surgery  Why is it important to control my blood sugar before and after surgery? . Improving blood sugar levels before and after surgery helps healing and can limit problems. . A way of improving blood sugar  control is eating a healthy diet by: o  Eating less sugar and carbohydrates o  Increasing activity/exercise o  Talking with your doctor about reaching your blood sugar goals . High blood sugars (greater than 180 mg/dL) can raise your risk of infections and slow your recovery, so you will need to focus on controlling your diabetes during the weeks before surgery. . Make sure that the doctor who takes care of your diabetes knows about your planned surgery including the date and location.  How do I manage my blood sugar before surgery? . Check your blood sugar at least 4 times a day, starting 2 days before surgery, to make sure that the level is not too high or low. o Check your blood sugar the morning of your surgery when you wake up and every 2 hours until you get to the Short Stay unit. . If your blood sugar is less than 70 mg/dL, you will need to treat for low blood sugar: o Do not take insulin. o Treat a low blood sugar (less than 70 mg/dL) with  cup of clear juice (cranberry or apple), 4 glucose tablets, OR glucose gel. Recheck blood sugar in 15 minutes after treatment (to make sure it is greater than 70 mg/dL). If your blood sugar is not greater than 70 mg/dL on recheck, call [phone] o  for further instructions. . Report your blood sugar to the short stay nurse when you get to Short Stay.  Marland Kitchen  If you are admitted to the hospital after surgery: o Your blood sugar will be checked by the staff and you will probably be given insulin after surgery (instead of oral diabetes medicines) to make sure you have good blood sugar levels. o The goal for blood sugar control after surgery is 80-180 mg/dL.    Do not wear jewelry, make-up or nail polish.  Do not wear lotions, powders, or perfumes, or deodorant.  Do not shave 48 hours prior to surgery.    Do not bring valuables to the hospital.  [facility] is not responsible for any belongings or valuables.  Contacts, dentures or bridgework may not  be worn into surgery.  Leave your suitcase in the car.  After surgery it may be brought to your room.  For patients admitted to the hospital, discharge time will be determined by your treatment team.  Patients discharged the day of surgery will not be allowed to drive home.    [facility]- Preparing For Surgery  Before surgery, you can play an important role. Because skin is not sterile, your skin needs to be as free of germs as possible. You can reduce the number of germs on your skin by washing with CHG (chlorahexidine gluconate) Soap before surgery.  CHG is an antiseptic cleaner which kills germs and bonds with the skin to continue killing germs even after washing.    Oral Hygiene is also important to reduce your risk of infection.  Remember - BRUSH YOUR TEETH THE MORNING OF SURGERY WITH YOUR REGULAR TOOTHPASTE  Please do not use if you have an allergy to CHG or antibacterial soaps. If your skin becomes reddened/irritated stop using the CHG.  Do not shave (including legs and underarms) for at least 48 hours prior to first CHG shower. It is OK to shave your face.  Please follow these instructions carefully.   1. Shower the NIGHT BEFORE SURGERY and the MORNING OF SURGERY with CHG.   2. If you chose to wash your hair, wash your hair first as usual with your normal shampoo.  3. After you shampoo, rinse your hair and body thoroughly to remove the shampoo.  4. Use CHG as you would any other liquid soap. You can apply CHG directly to the skin and wash gently with a scrungie or a clean washcloth.   5. Apply the CHG Soap to your body ONLY FROM THE NECK DOWN.  Do not use on open wounds or open sores. Avoid contact with your eyes, ears, mouth and genitals (private parts). Wash Face and genitals (private parts)  with your normal soap.  6. Wash thoroughly, paying special attention to the area where your surgery will be performed.  7. Thoroughly rinse your body with warm water from the neck  down.  8. DO NOT shower/wash with your normal soap after using and rinsing off the CHG Soap.  9. Pat yourself dry with a CLEAN TOWEL.  10. Wear CLEAN PAJAMAS to bed the night before surgery, wear comfortable clothes the morning of surgery  11. Place CLEAN SHEETS on your bed the night of your first shower and DO NOT SLEEP WITH PETS.  Day of Surgery:  Do not apply any deodorants/lotions.  Please wear clean clothes to the hospital/surgery center.   Remember to brush your teeth WITH YOUR REGULAR TOOTHPASTE.   Please read over the following fact sheets that you were given. Pain Booklet, Coughing and Deep Breathing, MRSA Information and Surgical Site Infection Prevention

## 2017-11-04 ENCOUNTER — Encounter (HOSPITAL_COMMUNITY): Payer: Self-pay

## 2017-11-04 ENCOUNTER — Encounter (HOSPITAL_COMMUNITY)
Admission: RE | Admit: 2017-11-04 | Discharge: 2017-11-04 | Disposition: A | Discharge status: Home / Self Care | Payer: Medicare Other | Source: Ambulatory Visit | Attending: Cardiovascular Disease | Admitting: Cardiovascular Disease

## 2017-11-04 ENCOUNTER — Ambulatory Visit (HOSPITAL_COMMUNITY)
Admission: RE | Admit: 2017-11-04 | Discharge: 2017-11-04 | Disposition: A | Discharge status: Home / Self Care | Payer: Medicare Other | Source: Ambulatory Visit | Attending: Cardiovascular Disease | Admitting: Cardiovascular Disease

## 2017-11-04 ENCOUNTER — Other Ambulatory Visit: Payer: Self-pay

## 2017-11-04 ENCOUNTER — Telehealth: Payer: Self-pay | Admitting: Physician Assistant

## 2017-11-04 ENCOUNTER — Other Ambulatory Visit: Payer: Self-pay | Admitting: Physician Assistant

## 2017-11-04 DIAGNOSIS — Z951 Presence of aortocoronary bypass graft: Secondary | ICD-10-CM | POA: Insufficient documentation

## 2017-11-04 DIAGNOSIS — J984 Other disorders of lung: Secondary | ICD-10-CM | POA: Insufficient documentation

## 2017-11-04 DIAGNOSIS — I350 Nonrheumatic aortic (valve) stenosis: Secondary | ICD-10-CM

## 2017-11-04 DIAGNOSIS — E119 Type 2 diabetes mellitus without complications: Secondary | ICD-10-CM | POA: Diagnosis not present

## 2017-11-04 DIAGNOSIS — Z01812 Encounter for preprocedural laboratory examination: Secondary | ICD-10-CM | POA: Insufficient documentation

## 2017-11-04 DIAGNOSIS — I4891 Unspecified atrial fibrillation: Secondary | ICD-10-CM | POA: Insufficient documentation

## 2017-11-04 DIAGNOSIS — I119 Hypertensive heart disease without heart failure: Secondary | ICD-10-CM | POA: Insufficient documentation

## 2017-11-04 DIAGNOSIS — N289 Disorder of kidney and ureter, unspecified: Secondary | ICD-10-CM | POA: Diagnosis not present

## 2017-11-04 DIAGNOSIS — K219 Gastro-esophageal reflux disease without esophagitis: Secondary | ICD-10-CM | POA: Insufficient documentation

## 2017-11-04 DIAGNOSIS — I700 Atherosclerosis of aorta: Secondary | ICD-10-CM | POA: Insufficient documentation

## 2017-11-04 DIAGNOSIS — Z79899 Other long term (current) drug therapy: Secondary | ICD-10-CM | POA: Insufficient documentation

## 2017-11-04 DIAGNOSIS — I2510 Atherosclerotic heart disease of native coronary artery without angina pectoris: Secondary | ICD-10-CM | POA: Insufficient documentation

## 2017-11-04 DIAGNOSIS — Z7984 Long term (current) use of oral hypoglycemic drugs: Secondary | ICD-10-CM | POA: Diagnosis not present

## 2017-11-04 DIAGNOSIS — Z01818 Encounter for other preprocedural examination: Secondary | ICD-10-CM | POA: Diagnosis present

## 2017-11-04 LAB — URINALYSIS, ROUTINE W REFLEX MICROSCOPIC
Bilirubin Urine: NEGATIVE
Glucose, UA: NEGATIVE mg/dL
Hgb urine dipstick: NEGATIVE
Ketones, ur: NEGATIVE mg/dL
Nitrite: POSITIVE — AB
Protein, ur: 100 mg/dL — AB
Specific Gravity, Urine: 1.011 (ref 1.005–1.030)
pH: 5 (ref 5.0–8.0)

## 2017-11-04 LAB — TYPE AND SCREEN
ABO/RH(D): A POS
Antibody Screen: NEGATIVE

## 2017-11-04 LAB — BLOOD GAS, ARTERIAL
Acid-Base Excess: 1.2 mmol/L (ref 0.0–2.0)
BICARBONATE: 25.3 mmol/L (ref 20.0–28.0)
DRAWN BY: 470591
FIO2: 21
O2 SAT: 98.3 %
Patient temperature: 98.6
pCO2 arterial: 40.7 mmHg (ref 32.0–48.0)
pH, Arterial: 7.411 (ref 7.350–7.450)
pO2, Arterial: 112 mmHg — ABNORMAL HIGH (ref 83.0–108.0)

## 2017-11-04 LAB — COMPREHENSIVE METABOLIC PANEL
ALBUMIN: 3 g/dL — AB (ref 3.5–5.0)
ALT: 24 U/L (ref 0–44)
ANION GAP: 9 (ref 5–15)
AST: 38 U/L (ref 15–41)
Alkaline Phosphatase: 114 U/L (ref 38–126)
BILIRUBIN TOTAL: 0.7 mg/dL (ref 0.3–1.2)
BUN: 32 mg/dL — AB (ref 8–23)
CHLORIDE: 105 mmol/L (ref 98–111)
CO2: 22 mmol/L (ref 22–32)
Calcium: 9.5 mg/dL (ref 8.9–10.3)
Creatinine, Ser: 1.17 mg/dL — ABNORMAL HIGH (ref 0.44–1.00)
GFR calc Af Amer: 50 mL/min — ABNORMAL LOW (ref >60)
GFR, EST NON AFRICAN AMERICAN: 43 mL/min — AB (ref >60)
GLUCOSE: 146 mg/dL — AB (ref 70–99)
Potassium: 4 mmol/L (ref 3.5–5.1)
Sodium: 136 mmol/L (ref 135–145)
TOTAL PROTEIN: 7.2 g/dL (ref 6.5–8.1)

## 2017-11-04 LAB — CBC
HEMATOCRIT: 37.7 % (ref 36.0–46.0)
Hemoglobin: 11.5 g/dL — ABNORMAL LOW (ref 12.0–15.0)
MCH: 29.6 pg (ref 26.0–34.0)
MCHC: 30.5 g/dL (ref 30.0–36.0)
MCV: 97.2 fL (ref 78.0–100.0)
PLATELETS: 266 10*3/uL (ref 150–400)
RBC: 3.88 MIL/uL (ref 3.87–5.11)
RDW: 14.8 % (ref 11.5–15.5)
WBC: 7.5 10*3/uL (ref 4.0–10.5)

## 2017-11-04 LAB — HEMOGLOBIN A1C
HEMOGLOBIN A1C: 6 % — AB (ref 4.8–5.6)
Mean Plasma Glucose: 125.5 mg/dL

## 2017-11-04 LAB — PROTIME-INR
INR: 1.14
PROTHROMBIN TIME: 14.5 s (ref 11.4–15.2)

## 2017-11-04 LAB — SURGICAL PCR SCREEN
MRSA, PCR: NEGATIVE
STAPHYLOCOCCUS AUREUS: NEGATIVE

## 2017-11-04 LAB — BRAIN NATRIURETIC PEPTIDE: B NATRIURETIC PEPTIDE 5: 972.6 pg/mL — AB (ref 0.0–100.0)

## 2017-11-04 LAB — APTT: APTT: 34 s (ref 24–36)

## 2017-11-04 LAB — GLUCOSE, CAPILLARY: GLUCOSE-CAPILLARY: 168 mg/dL — AB (ref 70–99)

## 2017-11-04 MED ORDER — CIPROFLOXACIN HCL 250 MG PO TABS: 500.0000 mg | ORAL_TABLET | Freq: Two times a day (BID) | ORAL | 0 refills | Status: AC

## 2017-11-04 NOTE — Telephone Encounter (Signed)
  [facility]   MULTIDISCIPLINARY HEART VALVE TEAM  I was called by PAT RN about UA. It has bacteria and + for nitrites. I called the patient who did report increased urinary frequency recently. I will call in a Rx for cipro 250mg  [facility]  Angelena Form PA-C  [facility]

## 2017-11-04 NOTE — Progress Notes (Addendum)
PCP: [facility]  Cardiologist: Dr. Landry Corporal  EKG: 10/11/17 in EPIC  Stress test: pt denies past 5 years  ECHO: 08/26/17 in EPIC  Cardiac Cath: 09/2017 in EPIC  Chest x-ray: obtained today per order  Pt instructed and has stopped Eliquis beginning 10/31/17  Called and spoke with Nell Range PA-C to inform her of abnormal labs and urine. She verbalized understanding. 11/04/17 1020

## 2017-11-07 MED ORDER — DEXMEDETOMIDINE HCL IN NACL 400 MCG/100ML IV SOLN
0.1000 ug/kg/h | INTRAVENOUS | Status: DC
Start: 2017-11-08 — End: 2017-11-08
  Filled 2017-11-07: qty 100

## 2017-11-07 MED ORDER — DOPAMINE-DEXTROSE 3.2-5 MG/ML-% IV SOLN
0.0000 ug/kg/min | INTRAVENOUS | Status: DC
  Filled 2017-11-07: qty 250

## 2017-11-07 MED ORDER — MAGNESIUM SULFATE 50 % IJ SOLN
40.0000 meq | INTRAMUSCULAR | Status: DC
  Filled 2017-11-07: qty 9.85

## 2017-11-07 MED ORDER — NOREPINEPHRINE 4 MG/250ML-% IV SOLN
0.0000 ug/min | INTRAVENOUS | Status: DC
  Filled 2017-11-07: qty 250

## 2017-11-07 MED ORDER — EPINEPHRINE PF 1 MG/ML IJ SOLN
0.0000 ug/min | INTRAVENOUS | Status: DC
  Filled 2017-11-07: qty 4

## 2017-11-07 MED ORDER — NITROGLYCERIN IN D5W 200-5 MCG/ML-% IV SOLN
2.0000 ug/min | INTRAVENOUS | Status: DC
  Filled 2017-11-07: qty 250

## 2017-11-07 MED ORDER — SODIUM CHLORIDE 0.9 % IV SOLN
1.5000 g | INTRAVENOUS | Status: AC
  Administered 2017-11-08: 1.5 g via INTRAVENOUS
  Filled 2017-11-07: qty 1.5

## 2017-11-07 MED ORDER — SODIUM CHLORIDE 0.9 % IV SOLN
30.0000 ug/min | INTRAVENOUS | Status: DC
  Filled 2017-11-07: qty 2

## 2017-11-07 MED ORDER — INSULIN REGULAR HUMAN 100 UNIT/ML IJ SOLN
INTRAMUSCULAR | Status: DC
  Filled 2017-11-07: qty 1

## 2017-11-07 MED ORDER — SODIUM CHLORIDE 0.9 % IV SOLN
INTRAVENOUS | Status: DC
  Filled 2017-11-07: qty 30

## 2017-11-07 MED ORDER — POTASSIUM CHLORIDE 2 MEQ/ML IV SOLN
80.0000 meq | INTRAVENOUS | Status: DC
  Filled 2017-11-07: qty 40

## 2017-11-07 MED ORDER — SODIUM CHLORIDE 0.9 % IV SOLN
1500.0000 mg | INTRAVENOUS | Status: AC
  Administered 2017-11-08: 1500 mg via INTRAVENOUS
  Filled 2017-11-07: qty 500

## 2017-11-08 ENCOUNTER — Inpatient Hospital Stay (HOSPITAL_COMMUNITY)
Admission: RE | Admit: 2017-11-08 | Discharge: 2017-11-10 | DRG: 266 | Disposition: A | Discharge status: Home / Self Care | Payer: Medicare Other | Attending: Cardiovascular Disease | Admitting: Cardiovascular Disease

## 2017-11-08 ENCOUNTER — Encounter (HOSPITAL_COMMUNITY)
Admission: RE | Disposition: A | Discharge status: Home / Self Care | Payer: Self-pay | Source: Home / Self Care | Attending: Cardiovascular Disease

## 2017-11-08 ENCOUNTER — Encounter (HOSPITAL_COMMUNITY): Payer: Self-pay | Admitting: *Deleted

## 2017-11-08 ENCOUNTER — Inpatient Hospital Stay (HOSPITAL_COMMUNITY): Payer: Medicare Other | Admitting: Certified Registered"

## 2017-11-08 ENCOUNTER — Inpatient Hospital Stay (HOSPITAL_COMMUNITY): Payer: Medicare Other

## 2017-11-08 ENCOUNTER — Other Ambulatory Visit: Payer: Self-pay

## 2017-11-08 DIAGNOSIS — I350 Nonrheumatic aortic (valve) stenosis: Principal | ICD-10-CM | POA: Diagnosis present

## 2017-11-08 DIAGNOSIS — I10 Essential (primary) hypertension: Secondary | ICD-10-CM | POA: Diagnosis present

## 2017-11-08 DIAGNOSIS — Z79899 Other long term (current) drug therapy: Secondary | ICD-10-CM

## 2017-11-08 DIAGNOSIS — I2510 Atherosclerotic heart disease of native coronary artery without angina pectoris: Secondary | ICD-10-CM | POA: Diagnosis present

## 2017-11-08 DIAGNOSIS — I5033 Acute on chronic diastolic (congestive) heart failure: Secondary | ICD-10-CM | POA: Diagnosis present

## 2017-11-08 DIAGNOSIS — N183 Chronic kidney disease, stage 3 (moderate): Secondary | ICD-10-CM | POA: Diagnosis present

## 2017-11-08 DIAGNOSIS — D631 Anemia in chronic kidney disease: Secondary | ICD-10-CM | POA: Diagnosis present

## 2017-11-08 DIAGNOSIS — I130 Hypertensive heart and chronic kidney disease with heart failure and stage 1 through stage 4 chronic kidney disease, or unspecified chronic kidney disease: Secondary | ICD-10-CM | POA: Diagnosis present

## 2017-11-08 DIAGNOSIS — Z87891 Personal history of nicotine dependence: Secondary | ICD-10-CM

## 2017-11-08 DIAGNOSIS — M797 Fibromyalgia: Secondary | ICD-10-CM | POA: Diagnosis present

## 2017-11-08 DIAGNOSIS — E785 Hyperlipidemia, unspecified: Secondary | ICD-10-CM | POA: Diagnosis present

## 2017-11-08 DIAGNOSIS — I2582 Chronic total occlusion of coronary artery: Secondary | ICD-10-CM | POA: Diagnosis present

## 2017-11-08 DIAGNOSIS — I480 Paroxysmal atrial fibrillation: Secondary | ICD-10-CM | POA: Diagnosis present

## 2017-11-08 DIAGNOSIS — Z833 Family history of diabetes mellitus: Secondary | ICD-10-CM

## 2017-11-08 DIAGNOSIS — I481 Persistent atrial fibrillation: Secondary | ICD-10-CM | POA: Diagnosis present

## 2017-11-08 DIAGNOSIS — K219 Gastro-esophageal reflux disease without esophagitis: Secondary | ICD-10-CM | POA: Diagnosis present

## 2017-11-08 DIAGNOSIS — Z9841 Cataract extraction status, right eye: Secondary | ICD-10-CM

## 2017-11-08 DIAGNOSIS — I6529 Occlusion and stenosis of unspecified carotid artery: Secondary | ICD-10-CM | POA: Diagnosis present

## 2017-11-08 DIAGNOSIS — Z961 Presence of intraocular lens: Secondary | ICD-10-CM | POA: Diagnosis present

## 2017-11-08 DIAGNOSIS — I739 Peripheral vascular disease, unspecified: Secondary | ICD-10-CM | POA: Diagnosis present

## 2017-11-08 DIAGNOSIS — E1122 Type 2 diabetes mellitus with diabetic chronic kidney disease: Secondary | ICD-10-CM | POA: Diagnosis present

## 2017-11-08 DIAGNOSIS — Z95820 Peripheral vascular angioplasty status with implants and grafts: Secondary | ICD-10-CM | POA: Diagnosis not present

## 2017-11-08 DIAGNOSIS — E1151 Type 2 diabetes mellitus with diabetic peripheral angiopathy without gangrene: Secondary | ICD-10-CM | POA: Diagnosis present

## 2017-11-08 DIAGNOSIS — I482 Chronic atrial fibrillation, unspecified: Secondary | ICD-10-CM | POA: Diagnosis present

## 2017-11-08 DIAGNOSIS — Z952 Presence of prosthetic heart valve: Secondary | ICD-10-CM

## 2017-11-08 DIAGNOSIS — E669 Obesity, unspecified: Secondary | ICD-10-CM | POA: Diagnosis present

## 2017-11-08 DIAGNOSIS — H548 Legal blindness, as defined in USA: Secondary | ICD-10-CM | POA: Diagnosis present

## 2017-11-08 DIAGNOSIS — Z006 Encounter for examination for normal comparison and control in clinical research program: Secondary | ICD-10-CM

## 2017-11-08 DIAGNOSIS — R001 Bradycardia, unspecified: Secondary | ICD-10-CM | POA: Diagnosis present

## 2017-11-08 DIAGNOSIS — Z794 Long term (current) use of insulin: Secondary | ICD-10-CM

## 2017-11-08 DIAGNOSIS — I252 Old myocardial infarction: Secondary | ICD-10-CM | POA: Diagnosis not present

## 2017-11-08 DIAGNOSIS — Z9842 Cataract extraction status, left eye: Secondary | ICD-10-CM

## 2017-11-08 DIAGNOSIS — E11621 Type 2 diabetes mellitus with foot ulcer: Secondary | ICD-10-CM | POA: Diagnosis present

## 2017-11-08 DIAGNOSIS — I700 Atherosclerosis of aorta: Secondary | ICD-10-CM | POA: Diagnosis present

## 2017-11-08 DIAGNOSIS — Z888 Allergy status to other drugs, medicaments and biological substances status: Secondary | ICD-10-CM

## 2017-11-08 DIAGNOSIS — I340 Nonrheumatic mitral (valve) insufficiency: Secondary | ICD-10-CM | POA: Diagnosis not present

## 2017-11-08 DIAGNOSIS — I69320 Aphasia following cerebral infarction: Secondary | ICD-10-CM | POA: Diagnosis not present

## 2017-11-08 DIAGNOSIS — L97519 Non-pressure chronic ulcer of other part of right foot with unspecified severity: Secondary | ICD-10-CM | POA: Diagnosis present

## 2017-11-08 DIAGNOSIS — Z8719 Personal history of other diseases of the digestive system: Secondary | ICD-10-CM

## 2017-11-08 DIAGNOSIS — Z885 Allergy status to narcotic agent status: Secondary | ICD-10-CM

## 2017-11-08 DIAGNOSIS — I5032 Chronic diastolic (congestive) heart failure: Secondary | ICD-10-CM | POA: Diagnosis present

## 2017-11-08 DIAGNOSIS — Z951 Presence of aortocoronary bypass graft: Secondary | ICD-10-CM | POA: Diagnosis not present

## 2017-11-08 DIAGNOSIS — Z7901 Long term (current) use of anticoagulants: Secondary | ICD-10-CM

## 2017-11-08 DIAGNOSIS — Z8673 Personal history of transient ischemic attack (TIA), and cerebral infarction without residual deficits: Secondary | ICD-10-CM

## 2017-11-08 HISTORY — DX: Presence of prosthetic heart valve: Z95.2

## 2017-11-08 HISTORY — DX: Personal history of transient ischemic attack (TIA), and cerebral infarction without residual deficits: Z86.73

## 2017-11-08 HISTORY — PX: TEE WITHOUT CARDIOVERSION: SHX5443

## 2017-11-08 HISTORY — PX: TRANSCATHETER AORTIC VALVE REPLACEMENT, TRANSFEMORAL: SHX6400

## 2017-11-08 LAB — GLUCOSE, CAPILLARY
Glucose-Capillary: 140 mg/dL — ABNORMAL HIGH (ref 70–99)
Glucose-Capillary: 168 mg/dL — ABNORMAL HIGH (ref 70–99)
Glucose-Capillary: 149 mg/dL — ABNORMAL HIGH (ref 70–99)
Glucose-Capillary: 167 mg/dL — ABNORMAL HIGH (ref 70–99)
Glucose-Capillary: 187 mg/dL — ABNORMAL HIGH (ref 70–99)

## 2017-11-08 LAB — PROTIME-INR
INR: 1.17
Prothrombin Time: 14.8 seconds (ref 11.4–15.2)

## 2017-11-08 LAB — POCT I-STAT, CHEM 8
BUN: 30 mg/dL — ABNORMAL HIGH (ref 8–23)
Calcium, Ion: 1.26 mmol/L (ref 1.15–1.40)
Chloride: 103 mmol/L (ref 98–111)
Creatinine, Ser: 1.1 mg/dL — ABNORMAL HIGH (ref 0.44–1.00)
GLUCOSE: 198 mg/dL — AB (ref 70–99)
HEMATOCRIT: 31 % — AB (ref 36.0–46.0)
HEMOGLOBIN: 10.5 g/dL — AB (ref 12.0–15.0)
POTASSIUM: 4 mmol/L (ref 3.5–5.1)
SODIUM: 140 mmol/L (ref 135–145)
TCO2: 25 mmol/L (ref 22–32)

## 2017-11-08 SURGERY — IMPLANTATION, AORTIC VALVE, TRANSCATHETER, FEMORAL APPROACH
Anesthesia: Monitor Anesthesia Care | Site: Chest

## 2017-11-08 MED ORDER — PROTAMINE SULFATE 10 MG/ML IV SOLN
INTRAVENOUS | Status: AC
  Filled 2017-11-08: qty 25

## 2017-11-08 MED ORDER — LIDOCAINE HCL (PF) 1 % IJ SOLN
INTRAMUSCULAR | Status: DC | PRN
  Administered 2017-11-08: 12 mL

## 2017-11-08 MED ORDER — CHLORHEXIDINE GLUCONATE 0.12 % MT SOLN: 15.0000 mL | Freq: Once | OROMUCOSAL | Status: DC

## 2017-11-08 MED ORDER — IODIXANOL 320 MG/ML IV SOLN
INTRAVENOUS | Status: DC | PRN
  Administered 2017-11-08: 40.4 mL via INTRAVENOUS

## 2017-11-08 MED ORDER — SODIUM CHLORIDE 0.9% FLUSH
3.0000 mL | Freq: Two times a day (BID) | INTRAVENOUS | Status: DC
  Administered 2017-11-08 – 2017-11-10 (×4): 3 mL via INTRAVENOUS

## 2017-11-08 MED ORDER — SODIUM CHLORIDE 0.9% FLUSH: 3.0000 mL | INTRAVENOUS | Status: DC | PRN

## 2017-11-08 MED ORDER — MIDAZOLAM HCL 2 MG/2ML IJ SOLN
INTRAMUSCULAR | Status: AC
  Filled 2017-11-08: qty 2

## 2017-11-08 MED ORDER — MIDAZOLAM HCL 2 MG/2ML IJ SOLN: INTRAMUSCULAR | Status: DC | PRN

## 2017-11-08 MED ORDER — MORPHINE SULFATE (PF) 2 MG/ML IV SOLN: 2.0000 mg | INTRAVENOUS | Status: DC | PRN

## 2017-11-08 MED ORDER — INSULIN ASPART 100 UNIT/ML ~~LOC~~ SOLN
0.0000 [IU] | Freq: Three times a day (TID) | SUBCUTANEOUS | Status: DC
  Administered 2017-11-08: 2 [IU] via SUBCUTANEOUS
  Administered 2017-11-08 – 2017-11-09 (×3): 4 [IU] via SUBCUTANEOUS
  Administered 2017-11-09: 12 [IU] via SUBCUTANEOUS
  Administered 2017-11-09: 16 [IU] via SUBCUTANEOUS
  Administered 2017-11-09: 12 [IU] via SUBCUTANEOUS

## 2017-11-08 MED ORDER — ROCURONIUM BROMIDE 10 MG/ML (PF) SYRINGE
PREFILLED_SYRINGE | INTRAVENOUS | Status: AC
  Filled 2017-11-08: qty 10

## 2017-11-08 MED ORDER — HYDRALAZINE HCL 20 MG/ML IJ SOLN
INTRAMUSCULAR | Status: AC
  Filled 2017-11-08: qty 1

## 2017-11-08 MED ORDER — ATROPINE SULFATE 1 MG/10ML IJ SOSY
PREFILLED_SYRINGE | INTRAMUSCULAR | Status: AC
Start: 2017-11-08 — End: 2017-11-09
  Filled 2017-11-08: qty 10

## 2017-11-08 MED ORDER — LIDOCAINE 2% (20 MG/ML) 5 ML SYRINGE
INTRAMUSCULAR | Status: AC
  Filled 2017-11-08: qty 5

## 2017-11-08 MED ORDER — VANCOMYCIN HCL IN DEXTROSE 1-5 GM/200ML-% IV SOLN
1000.0000 mg | Freq: Once | INTRAVENOUS | Status: AC
  Administered 2017-11-08: 1000 mg via INTRAVENOUS
  Filled 2017-11-08: qty 200

## 2017-11-08 MED ORDER — FENTANYL CITRATE (PF) 250 MCG/5ML IJ SOLN
INTRAMUSCULAR | Status: DC | PRN
  Administered 2017-11-08 (×3): 25 ug via INTRAVENOUS

## 2017-11-08 MED ORDER — ONDANSETRON HCL 4 MG/2ML IJ SOLN: 4.0000 mg | Freq: Once | INTRAMUSCULAR | Status: DC | PRN

## 2017-11-08 MED ORDER — 0.9 % SODIUM CHLORIDE (POUR BTL) OPTIME
TOPICAL | Status: DC | PRN
  Administered 2017-11-08: 1000 mL

## 2017-11-08 MED ORDER — LIDOCAINE HCL (PF) 1 % IJ SOLN
INTRAMUSCULAR | Status: AC
  Filled 2017-11-08: qty 30

## 2017-11-08 MED ORDER — AMLODIPINE BESYLATE 5 MG PO TABS
5.0000 mg | ORAL_TABLET | Freq: Every day | ORAL | Status: DC
  Administered 2017-11-08 – 2017-11-10 (×3): 5 mg via ORAL
  Filled 2017-11-08 (×4): qty 1

## 2017-11-08 MED ORDER — CHLORHEXIDINE GLUCONATE 4 % EX LIQD: 60.0000 mL | Freq: Once | CUTANEOUS | Status: DC

## 2017-11-08 MED ORDER — CHLORHEXIDINE GLUCONATE 4 % EX LIQD: 30.0000 mL | CUTANEOUS | Status: DC

## 2017-11-08 MED ORDER — ASPIRIN 81 MG PO CHEW
81.0000 mg | CHEWABLE_TABLET | Freq: Every day | ORAL | Status: DC
  Administered 2017-11-09 – 2017-11-10 (×2): 81 mg via ORAL
  Filled 2017-11-08 (×2): qty 1

## 2017-11-08 MED ORDER — MIDAZOLAM HCL 2 MG/2ML IJ SOLN
INTRAMUSCULAR | Status: DC | PRN
  Administered 2017-11-08: 1 mg via INTRAVENOUS

## 2017-11-08 MED ORDER — METOPROLOL TARTRATE 5 MG/5ML IV SOLN: 2.5000 mg | INTRAVENOUS | Status: DC | PRN

## 2017-11-08 MED ORDER — ACETAMINOPHEN 325 MG PO TABS
650.0000 mg | ORAL_TABLET | Freq: Four times a day (QID) | ORAL | Status: DC | PRN
  Administered 2017-11-08 – 2017-11-09 (×2): 650 mg via ORAL
  Filled 2017-11-08 (×2): qty 2

## 2017-11-08 MED ORDER — HYDRALAZINE HCL 20 MG/ML IJ SOLN
10.0000 mg | Freq: Once | INTRAMUSCULAR | Status: AC
  Administered 2017-11-08: 10 mg via INTRAVENOUS

## 2017-11-08 MED ORDER — SODIUM CHLORIDE 0.9 % IV SOLN
250.0000 mL | INTRAVENOUS | Status: DC | PRN
  Administered 2017-11-08: 250 mL via INTRAVENOUS

## 2017-11-08 MED ORDER — TRAMADOL HCL 50 MG PO TABS: 50.0000 mg | ORAL_TABLET | ORAL | Status: DC | PRN

## 2017-11-08 MED ORDER — LACTATED RINGERS IV SOLN
INTRAVENOUS | Status: DC | PRN
  Administered 2017-11-08: 07:00:00 via INTRAVENOUS

## 2017-11-08 MED ORDER — SODIUM CHLORIDE 0.9 % IV SOLN
INTRAVENOUS | Status: AC
  Administered 2017-11-08: 10:00:00 via INTRAVENOUS

## 2017-11-08 MED ORDER — VANCOMYCIN HCL 1000 MG IV SOLR: INTRAVENOUS | Status: DC | PRN

## 2017-11-08 MED ORDER — MEPERIDINE HCL 50 MG/ML IJ SOLN: 6.2500 mg | INTRAMUSCULAR | Status: DC | PRN

## 2017-11-08 MED ORDER — SODIUM CHLORIDE 0.9 % IV SOLN
INTRAVENOUS | Status: AC
  Filled 2017-11-08 (×3): qty 1.2

## 2017-11-08 MED ORDER — HYDROMORPHONE HCL 1 MG/ML IJ SOLN: 0.2500 mg | INTRAMUSCULAR | Status: DC | PRN

## 2017-11-08 MED ORDER — FENTANYL CITRATE (PF) 250 MCG/5ML IJ SOLN
INTRAMUSCULAR | Status: AC
Start: 2017-11-08 — End: ?
  Filled 2017-11-08: qty 5

## 2017-11-08 MED ORDER — PROTAMINE SULFATE 10 MG/ML IV SOLN
INTRAVENOUS | Status: DC | PRN
  Administered 2017-11-08: 140 mg via INTRAVENOUS

## 2017-11-08 MED ORDER — SODIUM CHLORIDE 0.9 % IV SOLN
INTRAVENOUS | Status: DC | PRN
  Administered 2017-11-08: 1500 mL

## 2017-11-08 MED ORDER — ONDANSETRON HCL 4 MG/2ML IJ SOLN: 4.0000 mg | Freq: Four times a day (QID) | INTRAMUSCULAR | Status: DC | PRN

## 2017-11-08 MED ORDER — SUCCINYLCHOLINE CHLORIDE 200 MG/10ML IV SOSY
PREFILLED_SYRINGE | INTRAVENOUS | Status: AC
  Filled 2017-11-08: qty 10

## 2017-11-08 MED ORDER — DEXMEDETOMIDINE HCL IN NACL 400 MCG/100ML IV SOLN
INTRAVENOUS | Status: DC | PRN
  Administered 2017-11-08: 6 ug/kg/h via INTRAVENOUS

## 2017-11-08 MED ORDER — HEPARIN SODIUM (PORCINE) 1000 UNIT/ML IJ SOLN
INTRAMUSCULAR | Status: DC | PRN
  Administered 2017-11-08: 14000 [IU] via INTRAVENOUS

## 2017-11-08 MED ORDER — OXYCODONE HCL 5 MG PO TABS: 5.0000 mg | ORAL_TABLET | ORAL | Status: DC | PRN

## 2017-11-08 MED ORDER — NITROGLYCERIN IN D5W 200-5 MCG/ML-% IV SOLN: 0.0000 ug/min | INTRAVENOUS | Status: DC

## 2017-11-08 MED ORDER — ATORVASTATIN CALCIUM 80 MG PO TABS
80.0000 mg | ORAL_TABLET | Freq: Every day | ORAL | Status: DC
  Administered 2017-11-08 – 2017-11-09 (×2): 80 mg via ORAL
  Filled 2017-11-08 (×2): qty 1

## 2017-11-08 MED ORDER — SODIUM CHLORIDE 0.9 % IV SOLN: INTRAVENOUS | Status: DC

## 2017-11-08 MED ORDER — PHENYLEPHRINE HCL-NACL 20-0.9 MG/250ML-% IV SOLN
0.0000 ug/min | INTRAVENOUS | Status: DC
  Filled 2017-11-08 (×2): qty 250

## 2017-11-08 MED ORDER — SODIUM CHLORIDE 0.9 % IV SOLN
1.5000 g | Freq: Two times a day (BID) | INTRAVENOUS | Status: AC
  Administered 2017-11-08 – 2017-11-10 (×4): 1.5 g via INTRAVENOUS
  Filled 2017-11-08 (×5): qty 1.5

## 2017-11-08 MED ORDER — HEPARIN SODIUM (PORCINE) 1000 UNIT/ML IJ SOLN
INTRAMUSCULAR | Status: AC
  Filled 2017-11-08: qty 2

## 2017-11-08 MED ORDER — PHENYLEPHRINE 40 MCG/ML (10ML) SYRINGE FOR IV PUSH (FOR BLOOD PRESSURE SUPPORT)
PREFILLED_SYRINGE | INTRAVENOUS | Status: AC
  Filled 2017-11-08: qty 10

## 2017-11-08 MED ORDER — ACETAMINOPHEN 650 MG RE SUPP: 650.0000 mg | Freq: Four times a day (QID) | RECTAL | Status: DC | PRN

## 2017-11-08 MED ORDER — ONDANSETRON HCL 4 MG/2ML IJ SOLN
INTRAMUSCULAR | Status: DC | PRN
  Administered 2017-11-08: 4 mg via INTRAVENOUS

## 2017-11-08 SURGICAL SUPPLY — 87 items
ADH SKN CLS APL DERMABOND .7 (GAUZE/BANDAGES/DRESSINGS) ×3
BAG DECANTER FOR FLEXI CONT (MISCELLANEOUS) ×3 IMPLANT
BAG SNAP BAND KOVER 36X36 (MISCELLANEOUS) ×5 IMPLANT
BLADE CLIPPER SURG (BLADE) IMPLANT
BLADE STERNUM SYSTEM 6 (BLADE) IMPLANT
CABLE ADAPT CONN TEMP 6FT (ADAPTER) ×5 IMPLANT
CANISTER SUCT 3000ML PPV (MISCELLANEOUS) IMPLANT
CANNULA FEM VENOUS REMOTE 22FR (CANNULA) IMPLANT
CANNULA OPTISITE PERFUSION 16F (CANNULA) IMPLANT
CANNULA OPTISITE PERFUSION 18F (CANNULA) IMPLANT
CATH DIAG EXPO 6F VENT PIG 145 (CATHETERS) ×10 IMPLANT
CATH EXPO 5FR AL1 (CATHETERS) IMPLANT
CATH INFINITI 6F AL2 (CATHETERS) IMPLANT
CATH S G BIP PACING (SET/KITS/TRAYS/PACK) ×5 IMPLANT
CLIP VESOCCLUDE MED 24/CT (CLIP) ×2 IMPLANT
CLIP VESOCCLUDE SM WIDE 24/CT (CLIP) ×2 IMPLANT
CONT SPEC 4OZ CLIKSEAL STRL BL (MISCELLANEOUS) ×13 IMPLANT
COVER BACK TABLE 80X110 HD (DRAPES) ×7 IMPLANT
COVER DOME SNAP 22 D (MISCELLANEOUS) IMPLANT
CRADLE DONUT ADULT HEAD (MISCELLANEOUS) ×5 IMPLANT
DERMABOND ADVANCED (GAUZE/BANDAGES/DRESSINGS) ×2
DERMABOND ADVANCED .7 DNX12 (GAUZE/BANDAGES/DRESSINGS) ×3 IMPLANT
DEVICE CLOSURE PERCLS PRGLD 6F (VASCULAR PRODUCTS) ×6 IMPLANT
DRAPE INCISE IOBAN 66X45 STRL (DRAPES) IMPLANT
DRSG TEGADERM 4X4.75 (GAUZE/BANDAGES/DRESSINGS) ×7 IMPLANT
ELECT CAUTERY BLADE 6.4 (BLADE) IMPLANT
ELECT REM PT RETURN 9FT ADLT (ELECTROSURGICAL) ×5
ELECTRODE REM PT RTRN 9FT ADLT (ELECTROSURGICAL) ×5 IMPLANT
FELT TEFLON 6X6 (MISCELLANEOUS) ×2 IMPLANT
FEMORAL VENOUS CANN RAP (CANNULA) IMPLANT
GAUZE SPONGE 4X4 12PLY STRL (GAUZE/BANDAGES/DRESSINGS) ×5 IMPLANT
GLOVE BIO SURGEON STRL SZ7.5 (GLOVE) IMPLANT
GLOVE BIO SURGEON STRL SZ8 (GLOVE) ×3 IMPLANT
GLOVE EUDERMIC 7 POWDERFREE (GLOVE) IMPLANT
GLOVE ORTHO TXT STRL SZ7.5 (GLOVE) ×3 IMPLANT
GOWN STRL REUS W/ TWL LRG LVL3 (GOWN DISPOSABLE) ×3 IMPLANT
GOWN STRL REUS W/ TWL XL LVL3 (GOWN DISPOSABLE) ×6 IMPLANT
GOWN STRL REUS W/TWL LRG LVL3 (GOWN DISPOSABLE) ×15
GOWN STRL REUS W/TWL XL LVL3 (GOWN DISPOSABLE) ×20
GUIDEWIRE SAF TJ AMPL .035X180 (WIRE) ×5 IMPLANT
GUIDEWIRE SAFE TJ AMPLATZ EXST (WIRE) ×5 IMPLANT
GUIDEWIRE STRAIGHT .035 260CM (WIRE) ×5 IMPLANT
INSERT FOGARTY SM (MISCELLANEOUS) IMPLANT
KIT BASIN OR (CUSTOM PROCEDURE TRAY) ×5 IMPLANT
KIT DILATOR VASC 18G NDL (KITS) IMPLANT
KIT HEART LEFT (KITS) ×5 IMPLANT
KIT SUCTION CATH 14FR (SUCTIONS) ×4 IMPLANT
KIT TURNOVER KIT B (KITS) ×5 IMPLANT
LOOP VESSEL MAXI BLUE (MISCELLANEOUS) IMPLANT
LOOP VESSEL MINI RED (MISCELLANEOUS) IMPLANT
NDL PERC 18GX7CM (NEEDLE) ×2 IMPLANT
NEEDLE PERC 18GX7CM (NEEDLE) ×5 IMPLANT
NS IRRIG 1000ML POUR BTL (IV SOLUTION) ×12 IMPLANT
PACK ENDOVASCULAR (PACKS) ×5 IMPLANT
PAD ARMBOARD 7.5X6 YLW CONV (MISCELLANEOUS) ×10 IMPLANT
PAD ELECT DEFIB RADIOL ZOLL (MISCELLANEOUS) ×5 IMPLANT
PENCIL BUTTON HOLSTER BLD 10FT (ELECTRODE) ×5 IMPLANT
PERCLOSE PROGLIDE 6F (VASCULAR PRODUCTS) ×10
SET MICROPUNCTURE 5F STIFF (MISCELLANEOUS) ×5 IMPLANT
SHEATH BRITE TIP 6FR 35CM (SHEATH) ×5 IMPLANT
SHEATH PINNACLE 6F 10CM (SHEATH) IMPLANT
SHEATH PINNACLE 8F 10CM (SHEATH) ×5 IMPLANT
SLEEVE REPOSITIONING LENGTH 30 (MISCELLANEOUS) ×5 IMPLANT
SPONGE LAP 4X18 RFD (DISPOSABLE) ×5 IMPLANT
STOPCOCK MORSE 400PSI 3WAY (MISCELLANEOUS) ×10 IMPLANT
SUT ETHIBOND X763 2 0 SH 1 (SUTURE) IMPLANT
SUT GORETEX CV 4 TH 22 36 (SUTURE) IMPLANT
SUT GORETEX CV4 TH-18 (SUTURE) IMPLANT
SUT MNCRL AB 3-0 PS2 18 (SUTURE) IMPLANT
SUT PROLENE 5 0 C 1 36 (SUTURE) IMPLANT
SUT PROLENE 6 0 C 1 30 (SUTURE) IMPLANT
SUT SILK  1 MH (SUTURE) ×2
SUT SILK 1 MH (SUTURE) ×3 IMPLANT
SUT VIC AB 2-0 CT1 27 (SUTURE)
SUT VIC AB 2-0 CT1 TAPERPNT 27 (SUTURE) IMPLANT
SUT VIC AB 2-0 CTX 36 (SUTURE) IMPLANT
SUT VIC AB 3-0 SH 8-18 (SUTURE) IMPLANT
SYR 50ML LL SCALE MARK (SYRINGE) ×5 IMPLANT
SYR BULB IRRIGATION 50ML (SYRINGE) IMPLANT
SYR CONTROL 10ML LL (SYRINGE) IMPLANT
TOWEL GREEN STERILE (TOWEL DISPOSABLE) ×10 IMPLANT
TRANSDUCER W/STOPCOCK (MISCELLANEOUS) ×10 IMPLANT
TRAY FOLEY SLVR 14FR TEMP STAT (SET/KITS/TRAYS/PACK) IMPLANT
TUBE SUCT INTRACARD DLP 20F (MISCELLANEOUS) IMPLANT
VALVE HEART TRANSCATH SZ3 26MM (Prosthesis & Implant Heart) ×3 IMPLANT
WIRE .035 3MM-J 145CM (WIRE) ×5 IMPLANT
WIRE BENTSON .035X145CM (WIRE) ×5 IMPLANT

## 2017-11-08 NOTE — Anesthesia Preprocedure Evaluation (Addendum)
Anesthesia Evaluation  Patient identified by MRN, date of birth, ID band Patient awake    Reviewed: Allergy & Precautions, NPO status , Patient's Chart, lab work & pertinent test results  Airway Mallampati: II  TM Distance: >3 FB Neck ROM: Full    Dental  (+) Edentulous Upper, Edentulous Lower   Pulmonary former smoker,    Pulmonary exam normal        Cardiovascular hypertension, Pt. on medications + CAD and + CABG  Normal cardiovascular exam+ dysrhythmias Atrial Fibrillation      Neuro/Psych CVA    GI/Hepatic GERD  Medicated and Controlled,  Endo/Other  diabetes, Type 2, Oral Hypoglycemic Agents  Renal/GU Renal InsufficiencyRenal disease     Musculoskeletal   Abdominal   Peds  Hematology   Anesthesia Other Findings   Reproductive/Obstetrics                            Anesthesia Physical Anesthesia Plan  ASA: III  Anesthesia Plan: General   Post-op Pain Management:    Induction: Intravenous  PONV Risk Score and Plan: 3 and Ondansetron and Treatment may vary due to age or medical condition  Airway Management Planned: Oral ETT  Additional Equipment: Arterial line, CVP and Ultrasound Guidance Line Placement  Intra-op Plan:   Post-operative Plan: Extubation in OR  Informed Consent: I have reviewed the patients History and Physical, chart, labs and discussed the procedure including the risks, benefits and alternatives for the proposed anesthesia with the patient or authorized representative who has indicated his/her understanding and acceptance.     Plan Discussed with: CRNA and Surgeon  Anesthesia Plan Comments:         Anesthesia Quick Evaluation

## 2017-11-08 NOTE — Anesthesia Postprocedure Evaluation (Signed)
Anesthesia Post Note  Patient: Ericca Labra  Procedure(s) Performed: TRANSCATHETER AORTIC VALVE REPLACEMENT, TRANSFEMORAL (N/A Chest) TRANSESOPHAGEAL ECHOCARDIOGRAM (TEE) (N/A )     Patient location during evaluation: PACU Anesthesia Type: MAC Level of consciousness: awake and alert Pain management: pain level controlled Vital Signs Assessment: post-procedure vital signs reviewed and stable Respiratory status: spontaneous breathing, nonlabored ventilation, respiratory function stable and patient connected to nasal cannula oxygen Cardiovascular status: stable and blood pressure returned to baseline Postop Assessment: no apparent nausea or vomiting Anesthetic complications: no    Last Vitals:  Vitals:   11/08/17 1300 11/08/17 1330  BP: (!) 135/55 (!) 146/52  Pulse: 60 (!) 56  Resp: 17 18  Temp:    SpO2: 97% 98%    Last Pain:  Vitals:   11/08/17 1200  TempSrc:   PainSc: 0-No pain                 Nancye Grumbine DAVID

## 2017-11-08 NOTE — Transfer of Care (Signed)
Immediate Anesthesia Transfer of Care Note  Patient: Kristina Summers  Procedure(s) Performed: TRANSCATHETER AORTIC VALVE REPLACEMENT, TRANSFEMORAL (N/A Chest) TRANSESOPHAGEAL ECHOCARDIOGRAM (TEE) (N/A )  Patient Location: ICU  Anesthesia Type:MAC  Level of Consciousness: sedated  Airway & Oxygen Therapy: Patient Spontanous Breathing and Patient connected to nasal cannula oxygen  Post-op Assessment: Report given to RN and Post -op Vital signs reviewed and stable  Post vital signs: Reviewed and stable  Last Vitals:  Vitals Value Taken Time  BP    Temp    Pulse 60 11/08/2017  9:51 AM  Resp 19 11/08/2017  9:51 AM  SpO2 96 % 11/08/2017  9:51 AM  Vitals shown include unvalidated device data.  Last Pain:  Vitals:   11/08/17 0615  TempSrc:   PainSc: 0-No pain      Patients Stated Pain Goal: 4 (77/93/90 3009)  Complications: No apparent anesthesia complications

## 2017-11-08 NOTE — Progress Notes (Signed)
6 french sheath removed from right femoral artery and pressure held x 20 minutes.  Vitals stable throughout sheath removal.  Site looks good level 0 with no hematoma.  Site dressed with 4x4 and tegaderm.  Distal pulse 1+right dp.   Instructions given for bedrest but patient still very sedated.  RN will continue to monitor site.

## 2017-11-08 NOTE — Progress Notes (Signed)
Venous sheath removed per order. Manual pressure held for 20 minutes. Site level 0 and VSS throughout procedure. Will continue to monitor closely.  [address] [place] A

## 2017-11-08 NOTE — Anesthesia Procedure Notes (Signed)
Central Venous Catheter Insertion Performed by: Roberts Gaudy, MD, anesthesiologist Start/End7/23/2019 7:00 AM, 11/08/2017 7:10 AM Patient location: Pre-op. Preanesthetic checklist: patient identified, IV checked, site marked, risks and benefits discussed, surgical consent, monitors and equipment checked, pre-op evaluation, timeout performed and anesthesia consent Lidocaine 1% used for infiltration and patient sedated Hand hygiene performed  and maximum sterile barriers used  Catheter size: 8 Fr Total catheter length 16. Central line was placed.Double lumen Procedure performed without using ultrasound guided technique. Attempts: 1 Following insertion, dressing applied and line sutured. Post procedure assessment: blood return through all ports  Patient tolerated the procedure well with no immediate complications.

## 2017-11-08 NOTE — Progress Notes (Signed)
  [place] VALVE TEAM  Patient doing well s/p TAVR. She is hemodynamically stable. Groin sites stable. ECG with afib with HRs in the 60s and no high grade block. I have removed temp pacer and Becky, RN has removed venous sheath and is holding pressure. BP is creeping up so I will resume home amlodipine. Plan for early ambulation after bed rest is complete and hopeful transfer to the floor tomorrow.    Angelena Form PA-C  [facility]  Pager [phone]

## 2017-11-08 NOTE — Interval H&P Note (Signed)
History and Physical Interval Note:  11/08/2017 6:16 AM  Kristina Summers  has presented today for surgery, with the diagnosis of Severe Aortic Stenosis  The various methods of treatment have been discussed with the patient and family. After consideration of risks, benefits and other options for treatment, the patient has consented to  Procedure(s): TRANSCATHETER AORTIC VALVE REPLACEMENT, TRANSFEMORAL (N/A) possible, TRANSCATHETER AORTIC VALVE REPLACEMENT,CAROTID (Left) TRANSESOPHAGEAL ECHOCARDIOGRAM (TEE) (N/A) as a surgical intervention .  The patient's history has been reviewed, patient examined, no change in status, stable for surgery.  I have reviewed the patient's chart and labs.  Questions were answered to the patient's satisfaction.     Rexene Alberts

## 2017-11-08 NOTE — Progress Notes (Signed)
2D Echocardiogram has been performed.  Kristina Summers 11/08/2017, 9:27 AM

## 2017-11-08 NOTE — Op Note (Signed)
[facility] VASCULAR CENTER   MULTIDISCIPLINARY HEART VALVE TEAM   TAVR OPERATIVE NOTE   Date of Procedure:  11/08/2017  Preoperative Diagnosis: Severe Aortic Stenosis   Postoperative Diagnosis: Same   Procedure:    Transcatheter Aortic Valve Replacement - Percutaneous  Transfemoral Approach  Edwards Sapien 3 THV (size 26 mm, model # 9600TFX, serial # [id number])   Co-Surgeons:  Valentina Gu. Roxy Manns, MD and Sherren Mocha, MD  Anesthesiologist:  Dr Conrad Winton  Echocardiographer:  Dr Johnsie Cancel  Pre-operative Echo Findings:  Severe aortic stenosis  Normal left ventricular systolic function  Post-operative Echo Findings:  Trivial paravalvular leak  Normal left ventricular systolic function  BRIEF CLINICAL NOTE AND INDICATIONS FOR SURGERY  Please see the complete operative note of Dr Roxy Manns for full details.   During the course of the patient's preoperative work up they have been evaluated comprehensively by a multidisciplinary team of specialists coordinated through the [facility] in the [facility].  They have been demonstrated to suffer from symptomatic severe aortic stenosis as noted above. The patient has been counseled extensively as to the relative risks and benefits of all options for the treatment of severe aortic stenosis including long term medical therapy, conventional surgery for aortic valve replacement, and transcatheter aortic valve replacement.  The patient has been independently evaluated by two cardiac surgeons including Dr. Roxy Manns and Dr. Cyndia Bent, and they are felt to be at high risk for conventional surgical aortic valve replacement. Based upon review of all of the patient's preoperative diagnostic tests they are felt to be candidate for transcatheter aortic valve replacement using the transfemoral approach as an alternative to high risk conventional surgery.    Following the decision to proceed with transcatheter aortic valve  replacement, a discussion has been held regarding what types of management strategies would be attempted intraoperatively in the event of life-threatening complications, including whether or not the patient would be considered a candidate for the use of cardiopulmonary bypass and/or conversion to open sternotomy for attempted surgical intervention.  The patient has been advised of a variety of complications that might develop peculiar to this approach including but not limited to risks of death, stroke, paravalvular leak, aortic dissection or other major vascular complications, aortic annulus rupture, device embolization, cardiac rupture or perforation, acute myocardial infarction, arrhythmia, heart block or bradycardia requiring permanent pacemaker placement, congestive heart failure, respiratory failure, renal failure, pneumonia, infection, other late complications related to structural valve deterioration or migration, or other complications that might ultimately cause a temporary or permanent loss of functional independence or other long term morbidity.  The patient provides full informed consent for the procedure as described and all questions were answered preoperatively.  DETAILS OF THE OPERATIVE PROCEDURE  PREPARATION:   The patient is brought to the operating room on the above mentioned date and central monitoring was established by the anesthesia team including placement of a central venous catheter and radial arterial line. The patient is placed in the supine position on the operating table.  Intravenous antibiotics are administered. The patient is monitored closely throughout the procedure under conscious sedation.  Baseline transthoracic echocardiogram is performed. The patient's chest, abdomen, both groins, and both lower extremities are prepared and draped in a sterile manner. A time out procedure is performed.  PERIPHERAL ACCESS:   Using ultrasound guidance, femoral arterial and venous  access is obtained with placement of 6 Fr sheaths on the right side.  [place] images are captured and stored in  the patient's chart. A pigtail diagnostic catheter was passed through the femoral arterial sheath under fluoroscopic guidance into the aortic root.  A temporary transvenous pacemaker catheter was passed through the femoral venous sheath under fluoroscopic guidance into the right ventricle.  The pacemaker was tested to ensure stable lead placement and pacemaker capture. Aortic root angiography was performed in order to determine the optimal angiographic angle for valve deployment.  TRANSFEMORAL ACCESS:  A micropuncture technique is used to access the left femoral artery under fluoroscopic and ultrasound guidance.  [place] images are captured and stored in the patient's chart. 2 Perclose devices are deployed at 10' and 2' positions to 'PreClose' the femoral artery. An 8 French sheath is placed and then an Amplatz Superstiff wire is advanced through the sheath. This is changed out for a 14 French transfemoral E-Sheath after progressively dilating over the Superstiff wire.  An AL-1 catheter was used to direct a straight-tip exchange length wire across the native aortic valve into the left ventricle. This was exchanged out for a pigtail catheter and position was confirmed in the LV apex. Simultaneous LV and Ao pressures were recorded.  The pigtail catheter was exchanged for an Amplatz Extra-stiff wire in the LV apex.    BALLOON AORTIC VALVULOPLASTY:  Not performed  TRANSCATHETER HEART VALVE DEPLOYMENT:  An Edwards Sapien 3 transcatheter heart valve (size 26 mm, model #9600TFX, serial #[id number]) was prepared and crimped per manufacturer's guidelines, and the proper orientation of the valve is confirmed on the [employer] delivery system. The valve was advanced through the introducer sheath using normal technique until in an appropriate position in the abdominal aorta beyond the sheath tip. The balloon was  then retracted and using the fine-tuning wheel was centered on the valve. The valve was then advanced across the aortic arch using appropriate flexion of the catheter. The valve was carefully positioned across the aortic valve annulus. The Commander catheter was retracted using normal technique. Once final position of the valve has been confirmed by angiographic assessment, the valve is deployed while temporarily holding ventilation and during rapid ventricular pacing to maintain systolic blood pressure < 50 mmHg and pulse pressure < 10 mmHg. The balloon inflation is held for >3 seconds after reaching full deployment volume. Once the balloon has fully deflated the balloon is retracted into the ascending aorta and valve function is assessed using echocardiography. There is felt to be no paravalvular leak and no central aortic insufficiency.  The patient's hemodynamic recovery following valve deployment is good.  The deployment balloon and guidewire are both removed. Echo demostrated acceptable post-procedural gradients, stable mitral valve function, and no aortic insufficiency.   PROCEDURE COMPLETION:  The sheath was removed and femoral artery closure is performed using the 2 previously deployed Perclose devices.  Protamine is administered once femoral arterial repair was complete. The site is clear with no evidence of bleeding or hematoma after the sutures are tightened. The temporary pacemaker is left in place because of bradycardia. Pigtail catheters and femoral sheaths were removed with manual pressure used for hemostasis.   The patient tolerated the procedure well and is transported to the surgical intensive care in stable condition. There were no immediate intraoperative complications. All sponge instrument and needle counts are verified correct at completion of the operation.   The patient received a total of 40 mL of intravenous contrast during the procedure.   Sherren Mocha, MD 11/08/2017 9:38  AM

## 2017-11-08 NOTE — Anesthesia Procedure Notes (Signed)
Procedure Name: MAC Date/Time: 11/08/2017 7:30 AM Performed by: Moshe Salisbury, CRNA Pre-anesthesia Checklist: Patient identified, Emergency Drugs available, Suction available, Patient being monitored and Timeout performed Oxygen Delivery Method: Nasal cannula Dental Injury: Teeth and Oropharynx as per pre-operative assessment

## 2017-11-08 NOTE — Anesthesia Procedure Notes (Signed)
Arterial Line Insertion Start/End7/23/2019 7:50 AM, 11/08/2017 7:55 AM Performed by: Moshe Salisbury, CRNA, CRNA  Patient location: Pre-op. Preanesthetic checklist: patient identified, IV checked, site marked, risks and benefits discussed, surgical consent, monitors and equipment checked, pre-op evaluation, timeout performed and anesthesia consent Lidocaine 1% used for infiltration and patient sedated Right, radial was placed Catheter size: 20 G Hand hygiene performed , maximum sterile barriers used  and Seldinger technique used Allen's test indicative of satisfactory collateral circulation Attempts: 1 Procedure performed without using ultrasound guided technique. Following insertion, dressing applied and Biopatch. Post procedure assessment: normal  Patient tolerated the procedure well with no immediate complications.

## 2017-11-08 NOTE — Plan of Care (Signed)
  Problem: Education: Goal: Knowledge of General Education information will improve Description: Including pain rating scale, medication(s)/side effects and non-pharmacologic comfort measures Outcome: Progressing   Problem: Health Behavior/Discharge Planning: Goal: Ability to manage health-related needs will improve Outcome: Progressing   Problem: Nutrition: Goal: Adequate nutrition will be maintained Outcome: Progressing   

## 2017-11-08 NOTE — Op Note (Signed)
[facility]   MULTIDISCIPLINARY HEART VALVE TEAM   TAVR OPERATIVE NOTE   Date of Procedure:  11/08/2017  Preoperative Diagnosis: Severe Aortic Stenosis   Postoperative Diagnosis: Same   Procedure:    Transcatheter Aortic Valve Replacement - Percutaneous Left Transfemoral Approach  Edwards Sapien 3 THV (size 26 mm, model # 9600TFX, serial # [id number])   Co-Surgeons:  Sherren Mocha, MD and Valentina Gu. Roxy Manns, MD   Anesthesiologist:  Lillia Abed, MD  Echocardiographer:  Jenkins Rouge, MD  Pre-operative Echo Findings:  Severe aortic stenosis  Normal left ventricular systolic function  Post-operative Echo Findings:  Trivial paravalvular leak  Normal left ventricular systolic function   BRIEF CLINICAL NOTE AND INDICATIONS FOR SURGERY  Patient is an 82 year old female originally from [place] with history of coronary artery disease status post coronary artery bypass grafting in the remote past, aortic stenosis, chronic diastolic congestive heart failure with multiple recent acute exacerbations, hypertension, previous stroke with residual mild a aphasia, long-standing persistent atrial fibrillation on long-term anticoagulation using Eliquis, history of GI bleeding on warfarin anticoagulation, 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    Patient's cardiac history dates back to 1992 when she suffered an acute inferior wall myocardial infarction.  She was initially treated with PCI but she eventually underwent multivessel coronary artery bypass grafting by Dr. Cyndia Bent in 2000.  She did well for many years and has been followed chronically by Dr. [name].  She was noted to have a heart murmur on physical exam and  echocardiograms have demonstrated the presence of aortic stenosis that has slowly progressed over time.  She developed atrial fibrillation and was initially treated with warfarin anticoagulation.  In 2016 she suffered a stroke.  She slowly recovered but has been left with residual expressive aphasia.  She has known history of peripheral arterial disease and has been followed by Dr. Bridgett Larsson for many years.  She had right common iliac artery stenting and atherectomy and PTA of the left superficial femoral artery.  She describes moderately limiting symptoms of fatigue in both lower legs with ambulation that limit her mobility considerably.  She also has very poor vision and is legally blind.  This developed after cataract extraction on the left eye several years ago with previous history of a "lazy eye" on the right that was never treated during childhood.  She was hospitalized twice this spring with acute exacerbation of chronic diastolic congestive heart failure.  Echocardiogram performed Aug 26, 2017 revealed significant progression and severity of aortic stenosis with preserved left ventricular systolic function.  Ejection fraction was estimated 50 to 55%.  Peak velocity across the aortic valve was reported 2.9 m/s but varied somewhat because of the patient's underlying atrial fibrillation.  Mean transvalvular gradient was reported only 21 mmHg but the DVI was reported only 0.26 and aortic valve area calculated 0.9 cm.  The patient was referred to the multidisciplinary heart valve clinic and has been evaluated previously by Dr. Burt Knack.  Left and right heart catheterization performed October 11, 2017 revealed severe aortic stenosis with mean transvalvular gradient measured 43 mmHg by catheterization.  Aortic valve area was calculated 0.8 cm.  The patient was noted to have severe native three-vessel coronary artery disease with chronic total occlusion of the right coronary artery and the obtuse marginal branches of  the  left circumflex coronary artery.  There was moderate disease of the mid left anterior descending coronary artery.  All of the previous patient's bypass grafts remain patent, although the left internal mammary artery was small and somewhat atretic.  The vein graft to the large diagonal branch was widely patent and provided excellent flow throughout the entire distal left anterior descending coronary artery.  There was continued patency of vein graft to the posterior descending coronary artery and the obtuse marginal branch of the left circumflex coronary artery as well.  The patient subsequently underwent CT angiography and was referred for surgical consultation.  During the course of the patient's preoperative work up they have been evaluated comprehensively by a multidisciplinary team of specialists coordinated through the [facility] in the [facility].  They have been demonstrated to suffer from symptomatic severe aortic stenosis as noted above. The patient has been counseled extensively as to the relative risks and benefits of all options for the treatment of severe aortic stenosis including long term medical therapy, conventional surgery for aortic valve replacement, and transcatheter aortic valve replacement.  All questions have been answered, and the patient provides full informed consent for the operation as described.   DETAILS OF THE OPERATIVE PROCEDURE  PREPARATION:    The patient is brought to the operating room on the above mentioned date and central monitoring was established by the anesthesia team including placement of a central venous line and radial arterial line. The patient is placed in the supine position on the operating table.  Intravenous antibiotics are administered. The patient is monitored closely throughout the procedure under conscious sedation.  Baseline transthoracic echocardiogram was performed. The patient's chest,  abdomen, both groins, and both lower extremities are prepared and draped in a sterile manner. A time out procedure is performed.   PERIPHERAL ACCESS:    Using the modified Seldinger technique, femoral arterial and venous access was obtained with placement of 6 Fr sheaths on the right side.  A pigtail diagnostic catheter was passed through the right arterial sheath under fluoroscopic guidance into the aortic root.  A temporary transvenous pacemaker catheter was passed through the right femoral venous sheath under fluoroscopic guidance into the right ventricle.  The pacemaker was tested to ensure stable lead placement and pacemaker capture. Aortic root angiography was performed in order to determine the optimal angiographic angle for valve deployment.   TRANSFEMORAL ACCESS:   Percutaneous transfemoral access and sheath placement was performed by Dr. Burt Knack using ultrasound guidance.  The left common femoral artery was cannulated using a micropuncture needle and appropriate location was verified using hand injection angiogram.  A pair of Abbott Perclose percutaneous closure devices were placed and a 6 French sheath replaced into the femoral artery.  The patient was heparinized systemically and ACT verified > 250 seconds.    A 14 Fr transfemoral E-sheath was introduced into the left common femoral artery after progressively dilating over an Amplatz superstiff wire. An AL-1 catheter was used to direct a straight-tip exchange length wire across the native aortic valve into the left ventricle. This was exchanged out for a pigtail catheter and position was confirmed in the LV apex. Simultaneous LV and Ao pressures were recorded.  The pigtail catheter was exchanged for an Amplatz Extra-stiff wire in the LV apex.  Echocardiography was utilized to confirm appropriate wire position and no sign of entanglement in the mitral subvalvular apparatus.   TRANSCATHETER HEART VALVE DEPLOYMENT:   An  Edwards Sapien 3  transcatheter heart valve (size 26 mm, model #[id number], serial #[id number]) was prepared and crimped per manufacturer's guidelines, and the proper orientation of the valve is confirmed on the [employer] delivery system. The valve was advanced through the introducer sheath using normal technique until in an appropriate position in the abdominal aorta beyond the sheath tip. The balloon was then retracted and using the fine-tuning wheel was centered on the valve. The valve was then advanced across the aortic arch using appropriate flexion of the catheter. The valve was carefully positioned across the aortic valve annulus. The Commander catheter was retracted using normal technique. Once final position of the valve has been confirmed by angiographic assessment, the valve is deployed while temporarily holding ventilation and during rapid ventricular pacing to maintain systolic blood pressure < 50 mmHg and pulse pressure < 10 mmHg. The balloon inflation is held for >3 seconds after reaching full deployment volume. Once the balloon has fully deflated the balloon is retracted into the ascending aorta and valve function is assessed using echocardiography. There is felt to be trivial paravalvular leak and no central aortic insufficiency.  The patient's hemodynamic recovery following valve deployment is good.  The deployment balloon and guidewire are both removed.    PROCEDURE COMPLETION:   The sheath was removed and femoral artery closure performed by Dr Burt Knack.  Protamine was administered once femoral arterial repair was complete. The pigtail catheters and femoral sheaths were removed with manual pressure used for hemostasis. The temporary pacemaker was left in place due to bradycardia.  The patient tolerated the procedure well and is transported to the surgical intensive care in stable condition. There were no immediate intraoperative complications. All sponge instrument and needle counts are verified correct at  completion of the operation.   No blood products were administered during the operation.  The patient received a total of 40.4 mL of intravenous contrast during the procedure.   Rexene Alberts, MD 11/08/2017 9:30 AM

## 2017-11-09 ENCOUNTER — Encounter (HOSPITAL_COMMUNITY): Payer: Self-pay | Admitting: Cardiovascular Disease

## 2017-11-09 ENCOUNTER — Other Ambulatory Visit: Payer: Self-pay | Admitting: Physician Assistant

## 2017-11-09 ENCOUNTER — Inpatient Hospital Stay (HOSPITAL_COMMUNITY): Payer: Medicare Other

## 2017-11-09 DIAGNOSIS — I340 Nonrheumatic mitral (valve) insufficiency: Secondary | ICD-10-CM

## 2017-11-09 DIAGNOSIS — Z952 Presence of prosthetic heart valve: Secondary | ICD-10-CM

## 2017-11-09 LAB — GLUCOSE, CAPILLARY
GLUCOSE-CAPILLARY: 280 mg/dL — AB (ref 70–99)
GLUCOSE-CAPILLARY: 280 mg/dL — AB (ref 70–99)
Glucose-Capillary: 178 mg/dL — ABNORMAL HIGH (ref 70–99)
Glucose-Capillary: 335 mg/dL — ABNORMAL HIGH (ref 70–99)

## 2017-11-09 LAB — BASIC METABOLIC PANEL
Anion gap: 7 (ref 5–15)
BUN: 31 mg/dL — AB (ref 8–23)
CALCIUM: 8.5 mg/dL — AB (ref 8.9–10.3)
CO2: 26 mmol/L (ref 22–32)
Chloride: 107 mmol/L (ref 98–111)
Creatinine, Ser: 1.38 mg/dL — ABNORMAL HIGH (ref 0.44–1.00)
GFR calc Af Amer: 41 mL/min — ABNORMAL LOW (ref >60)
GFR, EST NON AFRICAN AMERICAN: 35 mL/min — AB (ref >60)
GLUCOSE: 158 mg/dL — AB (ref 70–99)
POTASSIUM: 3.9 mmol/L (ref 3.5–5.1)
Sodium: 140 mmol/L (ref 135–145)

## 2017-11-09 LAB — CBC
HCT: 33.8 % — ABNORMAL LOW (ref 36.0–46.0)
Hemoglobin: 10.4 g/dL — ABNORMAL LOW (ref 12.0–15.0)
MCH: 29.7 pg (ref 26.0–34.0)
MCHC: 30.8 g/dL (ref 30.0–36.0)
MCV: 96.6 fL (ref 78.0–100.0)
PLATELETS: 153 10*3/uL (ref 150–400)
RBC: 3.5 MIL/uL — ABNORMAL LOW (ref 3.87–5.11)
RDW: 15 % (ref 11.5–15.5)
WBC: 10.7 10*3/uL — ABNORMAL HIGH (ref 4.0–10.5)

## 2017-11-09 LAB — ECHOCARDIOGRAM COMPLETE
Height: 66 in
WEIGHTICAEL: 3040.58 [oz_av]

## 2017-11-09 LAB — MAGNESIUM: Magnesium: 2.2 mg/dL (ref 1.7–2.4)

## 2017-11-09 MED ORDER — METOPROLOL TARTRATE 12.5 MG HALF TABLET
12.5000 mg | ORAL_TABLET | Freq: Two times a day (BID) | ORAL | Status: DC
  Administered 2017-11-09 – 2017-11-10 (×3): 12.5 mg via ORAL
  Filled 2017-11-09 (×3): qty 1

## 2017-11-09 MED ORDER — FUROSEMIDE 10 MG/ML IJ SOLN
40.0000 mg | Freq: Once | INTRAMUSCULAR | Status: AC
  Administered 2017-11-09: 40 mg via INTRAVENOUS

## 2017-11-09 MED ORDER — SODIUM CHLORIDE 0.9% FLUSH: 10.0000 mL | INTRAVENOUS | Status: DC | PRN

## 2017-11-09 MED ORDER — SODIUM CHLORIDE 0.9% FLUSH: 10.0000 mL | Freq: Two times a day (BID) | INTRAVENOUS | Status: DC

## 2017-11-09 MED ORDER — CHLORHEXIDINE GLUCONATE CLOTH 2 % EX PADS
6.0000 | MEDICATED_PAD | Freq: Every day | CUTANEOUS | Status: DC
  Administered 2017-11-09: 6 via TOPICAL

## 2017-11-09 MED ORDER — FUROSEMIDE 10 MG/ML IJ SOLN
INTRAMUSCULAR | Status: AC
  Filled 2017-11-09: qty 4

## 2017-11-09 NOTE — Progress Notes (Signed)
CARDIAC REHAB PHASE I   PRE:  Rate/Rhythm: 83 afib    BP: sitting 151/63    SaO2: 93 RA  MODE:  Ambulation: 610 ft   POST:  Rate/Rhythm: 103 afib    BP: sitting 158/57     SaO2: 92 RA, 97 RA with rest in recliner  Pt moving fairly well. She used RW in hall but likes to walk fast and generally got her feet outside of RW on turns. We discussed this. She does not use AD in her small apartment. Sts she felt well without SOB walking. Discussed restrictions and walking guidelines. Gave her DM and low sodium diets. Will refer to [facility] however she is not sure yet if she will do program due to the fact that she does not drive. Will f/u tomorrow, will probably walk without RW to see how she does. [place], ACSM 11/09/2017 11:52 AM

## 2017-11-09 NOTE — Progress Notes (Signed)
  Echocardiogram 2D Echocardiogram has been performed.  Kristina Summers 11/09/2017, 10:07 AM

## 2017-11-09 NOTE — Discharge Instructions (Signed)
ACTIVITY AND EXERCISE °• Daily activity and exercise are an important part of your recovery. People recover at different rates depending on their general health and type of valve procedure. °• Most people recovering from TAVR feel better relatively quickly  °• No lifting, pushing, pulling more than 10 pounds (examples to avoid: groceries, vacuuming, gardening, golfing): °            - For one week with a procedure through the groin. °            - For six weeks for procedures through the chest wall. °            - For three months for procedures through the breast-bone. °NOTE: You will typically see one of our providers 7-10 days after your procedure to discuss WHEN TO RESUME the above activities.  °  °  °DRIVING °• Do not drive for until you are seen for follow up and cleared by a provider. °• If you have been told by your doctor in the past that you may not drive, you must talk with him/her before you begin driving again. °  °  °DRESSING °• Groin site: you may leave the clear dressing over the site for up to one week or until it falls off. °  °  °HYGIENE °• If you had a femoral (leg) procedure, you may take a shower when you return home. After the shower, pat the site dry. Do NOT use powder, oils or lotions in your groin area until the site has completely healed. °• If you had a chest procedure, you may shower when you return home unless specifically instructed not to by your discharging practitioner. °            - DO NOT scrub incision; pat dry with a towel °            - DO NOT apply any lotions, oils, powders to the incision °            - No tub baths / swimming for at least 2 weeks. °• If you notice any fevers, chills, increased pain, swelling, bleeding or pus, please contact your doctor. °  °ADDITIONAL INFORMATION °• If you are going to have an upcoming dental procedure, please contact our office as you will require antibiotics ahead of time to prevent infection on your heart valve.  ° ° ° ° ° °After TAVR  Checklist ° °Check  Test Description  ° Follow up appointment in 1-2 weeks  You will see our structural heart physician assistant, Katie Amarissa Koerner. Your incision sites will be checked and you will be cleared to drive and resume all normal activities if you are doing well.    ° 1 month echo and follow up  You will have an echo to check on your new heart valve and be seen back in the office by Katie Miakoda Mcmillion. Many times the echo is not read by your appointment time, but Katie will call you later that day or the following day to report your results.  ° Follow up with your primary cardiologist You will need to be seen by your primary cardiologist in the following 3-6 months after your 1 month appointment in the valve clinic. Often times your Plavix or Aspirin will be discontinued during this time, but this is decided on a case by case basis.   ° 1 year echo and follow up You will have another echo to check on your heart valve   after 1 year and be seen back in the office by Katie Samoria Fedorko. This your last structural heart visit.  ° Bacterial endocarditis prophylaxis  You will have to take antibiotics for the rest of your life before all dental procedures (even teeth cleanings) to protect your heart valve. Antibiotics are also required before some surgeries. Please check with your cardiologist before scheduling any surgeries. Also, please make sure to tell us if you have a penicillin allergy as you will require an alternative antibiotic.   ° ° °

## 2017-11-09 NOTE — Progress Notes (Addendum)
[facility]  Patient Name: Kristina Summers Date of Encounter: 11/09/2017  Primary Cardiologist: Dr. [name] / Dr. Burt Knack & Dr. Roxy Manns (TAVR)  Hospital Problem List     Principal Problem:   S/P TAVR (transcatheter aortic valve replacement) Active Problems:   CAD (coronary artery disease)   Paroxysmal atrial fibrillation   Diabetes mellitus type 2 with peripheral artery disease (HCC)   Hyperlipidemia   S/P CABG (coronary artery bypass graft)   History of GI bleed   Peripheral vascular disease, unspecified (HCC)   Essential hypertension   Severe aortic stenosis   CKD (chronic kidney disease)   Acute on chronic diastolic heart failure ([place])   History of CVA (cerebrovascular accident)     Subjective   Feeling well. No complaints. She was up walking and can tell a difference in her breathing. Had to prop up to sleep comfortably last night.   Inpatient Medications    Scheduled Meds: . amLODipine  5 mg Oral Daily  . aspirin  81 mg Oral Daily  . atorvastatin  80 mg Oral QHS  . furosemide      . insulin aspart  0-24 Units Subcutaneous TID AC & HS  . sodium chloride flush  3 mL Intravenous Q12H   Continuous Infusions: . sodium chloride Stopped (11/09/17 0747)  . cefUROXime (ZINACEF)  IV 200 mL/hr at 11/09/17 0800  . nitroGLYCERIN    . phenylephrine (NEO-SYNEPHRINE) Adult infusion Stopped (11/08/17 1220)   PRN Meds: sodium chloride, acetaminophen **OR** acetaminophen, metoprolol tartrate, morphine injection, ondansetron (ZOFRAN) IV, oxyCODONE, sodium chloride flush, traMADol   Vital Signs    Vitals:   11/09/17 0600 11/09/17 0700 11/09/17 0800 11/09/17 0820  BP: 134/66  (!) 140/50   Pulse: 73 80 81   Resp: 19 17 17    Temp:    98.3 F (36.8 C)  TempSrc:    Oral  SpO2: 99% 95% 96%   Weight: 190 lb 0.6 oz (86.2 kg)     Height:        Intake/Output Summary (Last 24 hours) at 11/09/2017 0901 Last data filed at 11/09/2017  0800 Gross per 24 hour  Intake 2432.24 ml  Output 675 ml  Net 1757.24 ml   Filed Weights   11/08/17 0549 11/08/17 1000 11/09/17 0600  Weight: 210 lb (95.3 kg) 197 lb 15.6 oz (89.8 kg) 190 lb 0.6 oz (86.2 kg)    Physical Exam   GEN: Well nourished, well developed, in no acute distress. obese HEENT: Grossly normal.  Neck: Supple, no JVD, carotid bruits, or masses. Cardiac: RRR. 2/6 SEM @ RUSB. No rubs, or gallops. No clubbing, cyanosis,. Trace LE edema. Radials/DP/PT 2+ and equal bilaterally.  Respiratory: decreased breath sounds at bases GI: Soft, nontender, nondistended, BS + x 4. MS: no deformity or atrophy. Skin: warm and dry, no rash. Groin sites stable.  Neuro:  Strength and sensation are intact. Psych: AAOx3.  Normal affect.  Labs    CBC Recent Labs    11/08/17 1039 11/09/17 0438  WBC  --  10.7*  HGB 10.5* 10.4*  HCT 31.0* 33.8*  MCV  --  96.6  PLT  --  194   Basic Metabolic Panel Recent Labs    11/08/17 1039 11/09/17 0438  NA 140 140  K 4.0 3.9  CL 103 107  CO2  --  26  GLUCOSE 198* 158*  BUN 30* 31*  CREATININE 1.10* 1.38*  CALCIUM  --  8.5*  MG  --  2.2   Liver Function Tests No results for input(s): AST, ALT, ALKPHOS, BILITOT, PROT, ALBUMIN in the last 72 hours. No results for input(s): LIPASE, AMYLASE in the last 72 hours. Cardiac Enzymes No results for input(s): CKTOTAL, CKMB, CKMBINDEX, TROPONINI in the last 72 hours. BNP Invalid input(s): POCBNP D-Dimer No results for input(s): DDIMER in the last 72 hours. Hemoglobin A1C No results for input(s): HGBA1C in the last 72 hours. Fasting Lipid Panel No results for input(s): CHOL, HDL, LDLCALC, TRIG, CHOLHDL, LDLDIRECT in the last 72 hours. Thyroid Function Tests No results for input(s): TSH, T4TOTAL, T3FREE, THYROIDAB in the last 72 hours.  Invalid input(s): FREET3  Telemetry    afib - Personally Reviewed  ECG    afib HR 76 - Personally Reviewed  Radiology    Dg Chest Port 1  View  Result Date: 11/08/2017 CLINICAL DATA:  Status post trans catheter aortic valve replacement. EXAM: PORTABLE CHEST 1 VIEW COMPARISON:  PA and lateral chest x-ray of November 14, 2017 FINDINGS: The lungs are reasonably well inflated. The interstitial markings are slightly more conspicuous today. The cardiac silhouette is enlarged. The pulmonary vascularity is mildly prominent. There are post CABG changes. The prosthetic aortic valve cage is visible in in reasonable position radiographically. There is dense calcification in the wall of the thoracic aorta. The right subclavian venous catheter tip projects over the midportion of the SVC. There is significant degenerative change of the right humeral head. IMPRESSION: Mild interstitial edema consistent with CHF. No pleural effusion or alveolar pneumonia. Thoracic aortic atherosclerosis. Electronically Signed   By: David  Martinique M.D.   On: 11/08/2017 10:14    Cardiac Studies   TAVR OPERATIVE NOTE   Date of Procedure:                11/08/2017  Preoperative Diagnosis:      Severe Aortic Stenosis   Procedure:        Transcatheter Aortic Valve Replacement - Percutaneous Left Transfemoral Approach             Edwards Sapien 3 THV (size 26 mm, model # 9600TFX, serial # [id number])              Co-Surgeons:                        Sherren Mocha, MD and Valentina Gu. Roxy Manns, MD   Pre-operative Echo Findings: ? Severe aortic stenosis ? Normal left ventricular systolic function  Post-operative Echo Findings: ? Trivial paravalvular leak ? Normal left ventricular systolic function  ______________   Post operative echo 11/09/17:  pending     Patient Profile     Kristina Summers is a 82 y.o. female with a history of CAD s/p CABG x4V (2000), CVA with residual expressive aphasia, GI bleeding, PAD s/p several LE interventions, chronic diastolic CHF, anemia, CKD, DMT2, PAF on Eliquis and severe AS who presented to [facility] on 11/08/17 for planned TAVR.  Assessment  & Plan    Severe AS: s/p successful TAVR with a 26 mm Edwards Sapien THV via the TF approach on 11/08/17. Post operative echo pending today. Continue ASA. Will resume home Eliquis at DC. Groin sites are stable. Will remove central line and transfer to 4E. Early ambulation and hopeful discharge home tomorrow.  Acute on chronic diastolic CHF: as evidenced by elevated BNP on pre admission blood work. Also CXR shows some mild pulmonary vascular congestion. Had some mild orthopnea last  night. I will give one dose of IV lasix 40mg  and then resume home Lasix 40mg  BID tomorrow.   CAD: recent cath showed patent bypass grafts. Continue medical therapy with ASA, BB and statin.   Chronic atrial fibrillation: she had some bradycardia perioperatively that has now resolved. HR in 60s. Will decrease home Lopressor from 25mg  BID to 12.5mg  BID and continue to monitor. Will resume home Eliquis at DC.  CKD stage III: creat with a mild bump: 1.10--> 1.38. Will continue to hold home Losartan and watch carefully with IV lasix.   DMT2: continue SSI  Dispo: I called her daughter today who said they will have her nephew stay with her for a week at discharge.   Signed,  Angelena Form, PA-C  11/09/2017, 9:01 AM  Pager [phone]  Patient seen, examined. Available data reviewed. Agree with findings, assessment, and plan as outlined by Nell Range, PA-C. On my exam: Vitals:   11/09/17 1226 11/09/17 1623  BP: 137/85 (!) 148/57  Pulse: 70   Resp: 16 20  Temp: 97.9 F (36.6 C) 97.7 F (36.5 C)  SpO2: 96% 97%   Pt is alert and oriented, NAD HEENT: normal Neck: JVP - normal Lungs: CTA bilaterally CV: RRR with 2/6 systolic murmur at the RUSB Abd: soft, NT, Positive BS, no hepatomegaly Ext: no C/C/E Skin: warm/dry no rash  Echo reviewed shows normal TAVR valve function with result below:        Left ventricle:  The cavity size was mildly dilated. Wall thickness was normal. Systolic function was normal. The  estimated ejection fraction was in the range of 50% to 55%. The study is not technically sufficient to allow evaluation of LV diastolic function.  ------------------------------------------------------------------- Aortic valve:  Normal appearing 26 mm Sapien 3 valve with no significant peri valvular regurgitation.  Doppler:     VTI ratio of LVOT to aortic valve: 0.55. Valve area (VTI): 2.3 cm^2. Indexed valve area (VTI): 1.13 cm^2/m^2. Mean velocity ratio of LVOT to aortic valve: 0.66. Valve area (Vmean): 2.74 cm^2. Indexed valve area (Vmean): 1.35 cm^2/m^2.    Mean gradient (S): 10 mm Hg.   ------------------------------------------------------------------- Mitral valve:   Calcified annulus.  Doppler:  There was mild regurgitation.    Valve area by continuity equation (using LVOT flow): 3.4 cm^2. Indexed valve area by continuity equation (using LVOT flow): 1.67 cm^2/m^2.    Mean gradient (D): 5 mm Hg. Peak gradient (D): 9 mm Hg.  ------------------------------------------------------------------- Left atrium:  The atrium was moderately to severely dilated.  ------------------------------------------------------------------- Atrial septum:  No defect or patent foramen ovale was identified.   ------------------------------------------------------------------- Right ventricle:  The cavity size was normal. Wall thickness was normal. Systolic function was normal.  ------------------------------------------------------------------- Pulmonic valve:    Doppler:  There was mild regurgitation.  ------------------------------------------------------------------- Tricuspid valve:   Doppler:  There was mild regurgitation.   ------------------------------------------------------------------- Pericardium:  The pericardium was normal in appearance.  -------------------------------- ----------------------------------- Systemic veins: Inferior vena cava: The vessel was dilated. The  respirophasic diameter changes were blunted (< 50%), consistent with elevated central venous pressure.  Progressing well. Resume Eliquis tomorrow morning prior to discharge. Anticipate home tomorrow as long as renal stable.   Sherren Mocha, M.D. 11/09/2017 4:45 PM

## 2017-11-10 DIAGNOSIS — I5033 Acute on chronic diastolic (congestive) heart failure: Secondary | ICD-10-CM

## 2017-11-10 LAB — POCT I-STAT, CHEM 8
BUN: 30 mg/dL — ABNORMAL HIGH (ref 8–23)
BUN: 31 mg/dL — AB (ref 8–23)
BUN: 31 mg/dL — ABNORMAL HIGH (ref 8–23)
CALCIUM ION: 1.23 mmol/L (ref 1.15–1.40)
CALCIUM ION: 1.24 mmol/L (ref 1.15–1.40)
CHLORIDE: 103 mmol/L (ref 98–111)
Calcium, Ion: 1.18 mmol/L (ref 1.15–1.40)
Chloride: 106 mmol/L (ref 98–111)
Chloride: 102 mmol/L (ref 98–111)
Creatinine, Ser: 1.2 mg/dL — ABNORMAL HIGH (ref 0.44–1.00)
Creatinine, Ser: 1.1 mg/dL — ABNORMAL HIGH (ref 0.44–1.00)
Creatinine, Ser: 1.1 mg/dL — ABNORMAL HIGH (ref 0.44–1.00)
GLUCOSE: 162 mg/dL — AB (ref 70–99)
GLUCOSE: 194 mg/dL — AB (ref 70–99)
Glucose, Bld: 205 mg/dL — ABNORMAL HIGH (ref 70–99)
HCT: 29 % — ABNORMAL LOW (ref 36.0–46.0)
HCT: 30 % — ABNORMAL LOW (ref 36.0–46.0)
HCT: 30 % — ABNORMAL LOW (ref 36.0–46.0)
HEMOGLOBIN: 9.9 g/dL — AB (ref 12.0–15.0)
Hemoglobin: 10.2 g/dL — ABNORMAL LOW (ref 12.0–15.0)
Hemoglobin: 10.2 g/dL — ABNORMAL LOW (ref 12.0–15.0)
POTASSIUM: 3.9 mmol/L (ref 3.5–5.1)
Potassium: 4.1 mmol/L (ref 3.5–5.1)
Potassium: 4.3 mmol/L (ref 3.5–5.1)
SODIUM: 140 mmol/L (ref 135–145)
Sodium: 139 mmol/L (ref 135–145)
Sodium: 140 mmol/L (ref 135–145)
TCO2: 25 mmol/L (ref 22–32)
TCO2: 26 mmol/L (ref 22–32)
TCO2: 27 mmol/L (ref 22–32)

## 2017-11-10 LAB — CBC
HCT: 31.8 % — ABNORMAL LOW (ref 36.0–46.0)
Hemoglobin: 9.9 g/dL — ABNORMAL LOW (ref 12.0–15.0)
MCH: 29.8 pg (ref 26.0–34.0)
MCHC: 31.1 g/dL (ref 30.0–36.0)
MCV: 95.8 fL (ref 78.0–100.0)
Platelets: 127 10*3/uL — ABNORMAL LOW (ref 150–400)
RBC: 3.32 MIL/uL — AB (ref 3.87–5.11)
RDW: 14.8 % (ref 11.5–15.5)
WBC: 10.6 10*3/uL — AB (ref 4.0–10.5)

## 2017-11-10 LAB — BASIC METABOLIC PANEL
ANION GAP: 5 (ref 5–15)
BUN: 34 mg/dL — ABNORMAL HIGH (ref 8–23)
CO2: 27 mmol/L (ref 22–32)
Calcium: 8.6 mg/dL — ABNORMAL LOW (ref 8.9–10.3)
Chloride: 109 mmol/L (ref 98–111)
Creatinine, Ser: 1.45 mg/dL — ABNORMAL HIGH (ref 0.44–1.00)
GFR, EST AFRICAN AMERICAN: 38 mL/min — AB (ref >60)
GFR, EST NON AFRICAN AMERICAN: 33 mL/min — AB (ref >60)
Glucose, Bld: 72 mg/dL (ref 70–99)
POTASSIUM: 4.2 mmol/L (ref 3.5–5.1)
Sodium: 141 mmol/L (ref 135–145)

## 2017-11-10 LAB — GLUCOSE, CAPILLARY: Glucose-Capillary: 93 mg/dL (ref 70–99)

## 2017-11-10 MED ORDER — METOPROLOL TARTRATE 25 MG PO TABS: 12.5000 mg | ORAL_TABLET | Freq: Two times a day (BID) | ORAL | 6 refills | Status: DC

## 2017-11-10 MED ORDER — APIXABAN 5 MG PO TABS
5.0000 mg | ORAL_TABLET | Freq: Two times a day (BID) | ORAL | Status: DC
  Administered 2017-11-10: 5 mg via ORAL
  Filled 2017-11-10: qty 1

## 2017-11-10 MED ORDER — AMLODIPINE BESYLATE 10 MG PO TABS: 10.0000 mg | ORAL_TABLET | Freq: Every day | ORAL | 5 refills | Status: DC

## 2017-11-10 MED ORDER — ASPIRIN 81 MG PO CHEW: 81.0000 mg | CHEWABLE_TABLET | Freq: Every day | ORAL | Status: DC

## 2017-11-10 MED FILL — Magnesium Sulfate Inj 50%: INTRAMUSCULAR | Qty: 10 | Status: AC

## 2017-11-10 MED FILL — Potassium Chloride Inj 2 mEq/ML: INTRAVENOUS | Qty: 40 | Status: AC

## 2017-11-10 MED FILL — Sodium Chloride IV Soln 0.9%: INTRAVENOUS | Qty: 250 | Status: AC

## 2017-11-10 MED FILL — Heparin Sodium (Porcine) Inj 1000 Unit/ML: INTRAMUSCULAR | Qty: 30 | Status: AC

## 2017-11-10 MED FILL — Phenylephrine HCl IV Soln 10 MG/ML: INTRAVENOUS | Qty: 2 | Status: AC

## 2017-11-10 NOTE — Progress Notes (Signed)
Pt/family given discharge instructions, medication lists, follow up appointments, and when to call the doctor.  Pt/family verbalizes understanding. Pt given signs and symptoms of infection. Pt eating lunch while going over instructions. All questions answered. Pt transported to main entrance by SWOT. Payton Emerald, RN

## 2017-11-10 NOTE — Discharge Summary (Addendum)
[facility] VALVE TEAM   Discharge Summary    Patient ID: Kristina Summers,  MRN: [id number], DOB/AGE: 24-Jan-1936 82 y.o.  Admit date: 11/08/2017 Discharge date: 11/10/2017  Primary Care Provider: Care, [facility] Primary Cardiologist: Dr. [name] / Dr. Burt Knack & Dr. Roxy Manns (TAVR)   Discharge Diagnoses    Principal Problem:   S/P TAVR (transcatheter aortic valve replacement) Active Problems:   CAD (coronary artery disease)   Paroxysmal atrial fibrillation   Diabetes mellitus type 2 with peripheral artery disease (HCC)   Hyperlipidemia   S/P CABG (coronary artery bypass graft)   History of GI bleed   Peripheral vascular disease, unspecified (HCC)   Essential hypertension   Severe aortic stenosis   CKD (chronic kidney disease)   Acute on chronic diastolic heart failure (HCC)   History of CVA (cerebrovascular accident)   Allergies Allergies  Allergen Reactions  . Morphine And Related Nausea And Vomiting    Chest pain    . Lisinopril Cough  . Zoloft [Sertraline Hcl] Nausea Only          History of Present Illness     Kristina Summers is a 82 y.o. female with a history of CAD s/p CABGx4V(2000), CVA with residual expressive aphasia, GI bleeding, PAD s/p several LE interventions, chronic diastolic CHF,anemia, CKD, DMT2, PAF on Eliquis and severe AS who presented to [facility] on 11/08/17 for planned TAVR.  She has a history of inferior MI in 1992 and ultimately was treated with multivessel CABG in 2000 by Dr Cyndia Bent. In 2016 the patient had a stroke and has residual expressive aphasia. She ambulates with a walker, but remains functionally independent. She has been anticoagulated with apixaban for secondary stroke prevention since then. She previously had GI bleeding on warfarin and this had been discontinued. The patient also has significant PAD and has undergone several revascularization procedures including right common iliac stenting and orbital  atherectomy/PTA procedures on the left SFA. She ambulates with a walker and has fairly advanced vision loss. Despite all of her medical problems, she remains independent.    She has recently been admitted twice over the past year (since 01/2017) with acute CHF. 2D ECHO 08/26/17 showed EF 50-55% and severe AS with AVA 0.9 cm2 and mean gradient (S): 21 mm Hg; peak gradient (S): 34 mm Hg. She was seen by Dr. Burt Knack in the multidisciplinary valve clinic for evaluation of possible TAVR. He was suspicious that she had severe AS with a highly calcified aortic valve with very limited leaflet mobility. Diagnostic L/RHC on 10/11/17.showed severe native vessel CAD with continued patency of the SVG to PDA, SVG to OM, SVG to diagonal, and LIMA to LAD and severe AS with a mean transvalvular gradient of 43 mmHg and calculated aortic valve area of 0.8 cm.  She was set up for TAVR on 11/08/17   Hospital Course     Consultants: none  Severe AS: s/p successful TAVR with a 26 mm Edwards Sapien THV via the TF approach on 11/08/17. Post operative echo showed EF 50-55%, normally functioning TAVR valve with mean gradient of 10 mm Hg and no PVL. She was started on ASA 81mg  daily and home Eliquis resumed today. Groin sites are stable. Discharge home today with 1 week TOC visit.   Acute on chronic diastolic CHF: as evidenced by elevated BNP on pre admission blood work. Also CXR showed some mild pulmonary vascular congestion. She was treated with one dose of IV lasix 40mg  and  then resumed home Lasix 40mg  BID.   CAD: recent cath showed patent bypass grafts. Continue medical therapy with ASA, BB and statin.   Chronic atrial fibrillation: she had some bradycardia perioperatively that has now resolved. HR in 60s. Lopressor decreased from 25mg  BID to 12.5mg  BID. Eliquis resumed today.   AKI with CKD stage III: creat with a mild bump: 1.10--> 1.38--> 1.45. I will continue to hold her home Losartan 50mg  BID. She will resume home  lasix 40mg  BID. I will check a BMET next week to make sure kidney function stabilizes.  HTN: BP mildly elevated. I will increase home amlodipine from 5mg  to 10 mg given lower dose of BB and holding ARB.   DMT2: resume home regimen   Dispo: she will go home today. Her grandson will be there to help take care of her over the next week.    The patient has had an uncomplicated hospital course and is recovering well. The femoral catheter sites are stable. She has been seen by Dr. Burt Knack today and deemed ready for discharge home. All follow-up appointments have been scheduled. Discharge medications are listed below.  _____________  Discharge Vitals Blood pressure 132/87, pulse 69, temperature 97.9 F (36.6 C), temperature source Oral, resp. rate 17, height 5\' 6"  (1.676 m), weight 195 lb (88.5 kg), SpO2 94 %.  Filed Weights   11/08/17 1000 11/09/17 0600 11/10/17 0456  Weight: 197 lb 15.6 oz (89.8 kg) 190 lb 0.6 oz (86.2 kg) 195 lb (88.5 kg)    VS:  BP 132/87   Pulse 69   Temp 97.9 F (36.6 C) (Oral)   Resp 17   Ht 5\' 6"  (1.676 m)   Wt 195 lb (88.5 kg)   SpO2 94%   BMI 31.47 kg/m    GEN: Well nourished, well developed, in no acute distress, obese HEENT: normal  Neck: no JVD, carotid bruits, or masses Cardiac: RRR;2/6 SEM @ RUSB. Trace LE edema.  Respiratory:  clear to auscultation bilaterally, normal work of breathing GI: soft, nontender, nondistended, + BS MS: no deformity or atrophy  Skin: warm and dry, no rash. Groin sites stable Neuro:  Alert and Oriented x 3, Strength and sensation are intact Psych: euthymic mood, full affect   Labs & Radiologic Studies     CBC Recent Labs    11/09/17 0438 11/10/17 0250  WBC 10.7* 10.6*  HGB 10.4* 9.9*  HCT 33.8* 31.8*  MCV 96.6 95.8  PLT 153 132*   Basic Metabolic Panel Recent Labs    11/09/17 0438 11/10/17 0250  NA 140 141  K 3.9 4.2  CL 107 109  CO2 26 27  GLUCOSE 158* 72  BUN 31* 34*  CREATININE 1.38* 1.45*    CALCIUM 8.5* 8.6*  MG 2.2  --    Liver Function Tests No results for input(s): AST, ALT, ALKPHOS, BILITOT, PROT, ALBUMIN in the last 72 hours. No results for input(s): LIPASE, AMYLASE in the last 72 hours. Cardiac Enzymes No results for input(s): CKTOTAL, CKMB, CKMBINDEX, TROPONINI in the last 72 hours. BNP Invalid input(s): POCBNP D-Dimer No results for input(s): DDIMER in the last 72 hours. Hemoglobin A1C No results for input(s): HGBA1C in the last 72 hours. Fasting Lipid Panel No results for input(s): CHOL, HDL, LDLCALC, TRIG, CHOLHDL, LDLDIRECT in the last 72 hours. Thyroid Function Tests No results for input(s): TSH, T4TOTAL, T3FREE, THYROIDAB in the last 72 hours.  Invalid input(s): FREET3  Dg Chest 2 View  Result Date: 11/04/2017 CLINICAL DATA:  Severe aortic stenosis.  Preoperative evaluation. EXAM: CHEST - 2 VIEW COMPARISON:  CT 10/21/2017.  Radiographs 08/25/2017. FINDINGS: Stable cardiomegaly and aortic atherosclerosis status post median sternotomy and CABG. There is stable biapical scarring. No superimposed airspace disease, edema, pleural effusion or pneumothorax. There is an old fracture of the proximal right humerus with secondary glenohumeral degenerative changes. No acute osseous findings are seen. IMPRESSION: No acute cardiopulmonary process. Stable cardiomegaly, aortic atherosclerosis and pulmonary scarring. Electronically Signed   By: Richardean Sale M.D.   On: 11/04/2017 13:18   Ct Coronary Morph W/cta Cor W/score W/ca W/cm &/or Wo/cm  Addendum Date: 10/22/2017   ADDENDUM REPORT: 10/22/2017 13:55 CLINICAL DATA:  82 year old female with severe aortic stenosis being evaluated for a TAVR procedure. EXAM: Cardiac TAVR CT TECHNIQUE: The patient was scanned on a [employer]. A 120 kV retrospective scan was triggered in the descending thoracic aorta at 111 HU's. Gantry rotation speed was 250 msecs and collimation was .6 mm. No beta blockade or nitro were given.  The 3D data set was reconstructed in 5% intervals of the R-R cycle. Systolic and diastolic phases were analyzed on a dedicated work station using MPR, MIP and VRT modes. The patient received 80 cc of contrast. FINDINGS: Aortic Valve: Trileaflet, with severely calcified and thickened leaflets and no calcifications extending into the LVOT. Aorta: Normal size, moderate diffuse calcifications and atheroma in the aortic arch and descending thoracic aorta. Sinotubular Junction: 28 x 27 mm Ascending Thoracic Aorta: 33 x 30 mm Aortic Arch: 26 x 23 mm Descending Thoracic Aorta: 23 x 22 mm Sinus of Valsalva Measurements: Non-coronary: 34  mm Right -coronary: 31 mm Left -coronary: 32 mm Coronary Artery Height above Annulus: Left Main: 13 mm Right Coronary: 19 mm Virtual Basal Annulus Measurements: Maximum/Minimum Diameter: 29.2 x 22.6 mm Mean Diameter: 24.9 mm Perimeter: 80.8 mm Area: 487 mm 2 Optimum Fluoroscopic Angle for Delivery: LAO 11 CAU 11 IMPRESSION: 1. Trileaflet, with severely calcified and thickened leaflets and no calcifications extending into the LVOT. Annular measurements suitable for delivery of a 26 mm Edwards-SAPIEN 3 valve. 2. Sufficient coronary to annulus distance. 3. Optimum Fluoroscopic Angle for Delivery: LAO 11 CAU 11 4. No thrombus in the left atrial appendage (on delayed imaging). Electronically Signed   By: Ena Dawley   On: 10/22/2017 13:55   Result Date: 10/22/2017 EXAM: OVER-READ INTERPRETATION  CT CHEST The following report is an over-read performed by radiologist Dr. Vinnie Langton of [facility], [place] on 10/21/2017. This over-read does not include interpretation of cardiac or coronary anatomy or pathology. The coronary calcium score/coronary CTA interpretation by the cardiologist is attached. COMPARISON:  Chest CT 08/25/2017. FINDINGS: Extracardiac findings will be discussed separately under dictation for contemporaneously obtained CTA chest, abdomen and pelvis. IMPRESSION: 1.  Please see separate dictation for contemporaneously obtained CTA chest, abdomen and pelvis dated 10/21/2017 for full description of relevant extracardiac findings. Electronically Signed: By: Vinnie Langton M.D. On: 10/21/2017 15:37   Dg Chest Port 1 View  Result Date: 11/08/2017 CLINICAL DATA:  Status post trans catheter aortic valve replacement. EXAM: PORTABLE CHEST 1 VIEW COMPARISON:  PA and lateral chest x-ray of November 14, 2017 FINDINGS: The lungs are reasonably well inflated. The interstitial markings are slightly more conspicuous today. The cardiac silhouette is enlarged. The pulmonary vascularity is mildly prominent. There are post CABG changes. The prosthetic aortic valve cage is visible in in reasonable position radiographically. There is dense calcification in the wall of the thoracic aorta. The  right subclavian venous catheter tip projects over the midportion of the SVC. There is significant degenerative change of the right humeral head. IMPRESSION: Mild interstitial edema consistent with CHF. No pleural effusion or alveolar pneumonia. Thoracic aortic atherosclerosis. Electronically Signed   By: David  Martinique M.D.   On: 11/08/2017 10:14   Ct Angio Chest Aorta W &/or Wo Contrast  Result Date: 10/21/2017 CLINICAL DATA:  82 year old female with history of severe aortic stenosis. Preprocedural study prior to potential transcatheter aortic valve replacement (TAVR) procedure. EXAM: CT ANGIOGRAPHY CHEST, ABDOMEN AND PELVIS TECHNIQUE: Multidetector CT imaging through the chest, abdomen and pelvis was performed using the standard protocol during bolus administration of intravenous contrast. Multiplanar reconstructed images and MIPs were obtained and reviewed to evaluate the vascular anatomy. CONTRAST:  169mL ISOVUE-370 IOPAMIDOL (ISOVUE-370) INJECTION 76% COMPARISON:  Chest CT 08/25/2017. FINDINGS: CTA CHEST FINDINGS Cardiovascular: Cardiomegaly with left atrial dilatation. Filling defect in the tip of the  left atrial appendage (axial image 51 of series 14), concerning for potential thrombus although this may alternatively represent pseudo thrombus. There is aortic atherosclerosis, as well as atherosclerosis of the great vessels of the mediastinum and the coronary arteries, including calcified atherosclerotic plaque in the left main, left anterior descending, left circumflex and right coronary arteries. Status post median sternotomy for CABG including LIMA to the LAD. Severe thickening calcification of the aortic valve. Calcifications of the mitral annulus. Mediastinum/Lymph Nodes: No pathologically enlarged mediastinal or hilar lymph nodes. Esophagus is unremarkable in appearance. No axillary lymphadenopathy. Lungs/Pleura: No suspicious appearing pulmonary nodules or masses. No acute consolidative airspace disease. No pleural effusions. Musculoskeletal/Soft Tissues: There are no aggressive appearing lytic or blastic lesions noted in the visualized portions of the skeleton. Median sternotomy wires. CTA ABDOMEN AND PELVIS FINDINGS Hepatobiliary: Liver has a slightly shrunken appearance and nodular contour, indicative of underlying cirrhosis. No definite cystic or solid hepatic lesions. No intra or extrahepatic biliary ductal dilatation. Several small calcified gallstones lying dependently in the gallbladder. No surrounding inflammatory changes to suggest an acute cholecystitis at this time. Pancreas: No pancreatic mass. No pancreatic ductal dilatation. No pancreatic or peripancreatic fluid or inflammatory changes. Spleen: Unremarkable. Adrenals/Urinary Tract: Bilateral kidneys and bilateral adrenal glands are normal in appearance. No hydroureteronephrosis. Small amount of gas non dependently in the lumen of the urinary bladder. Urinary bladder is otherwise unremarkable in appearance. Stomach/Bowel: Normal appearance of stomach. No pathologic dilatation of small bowel or colon. Numerous colonic diverticulae are noted,  without surrounding inflammatory changes to suggest an acute diverticulitis at this time. Normal appendix. Vascular/Lymphatic: Aortic atherosclerosis, without evidence of aneurysm or dissection in the abdominal or pelvic vasculature. Vascular findings and measurements pertinent to potential TAVR procedure, as detailed below. Celiac axis and inferior mesenteric artery are widely patent without hemodynamically significant stenosis. Atherosclerosis at the ostium of the superior mesenteric artery with what appears to be mild to moderate stenosis. Atherosclerosis at the ostium of the single right renal artery with mild to moderate stenosis. Left renal artery appears patent without hemodynamically significant stenosis. No lymphadenopathy noted in the abdomen or pelvis. Reproductive: Uterus and ovaries are unremarkable in appearance. Other: No significant volume of ascites.  No pneumoperitoneum. Musculoskeletal: There are no aggressive appearing lytic or blastic lesions noted in the visualized portions of the skeleton. VASCULAR MEASUREMENTS PERTINENT TO TAVR: AORTA: Minimal Aortic Diameter-12 x 10 mm Severity of Aortic Calcification-severe RIGHT PELVIS: Right Common Iliac Artery - Indwelling stent Minimal Diameter-4.9 x 4.1 mm Tortuosity-mild Calcification-severe Right External Iliac Artery - Minimal Diameter-4.4  x 4.6 mm Tortuosity - mild Calcification-moderate Right Common Femoral Artery - Minimal Diameter-5.8 x 6.6 mm Tortuosity - mild Calcification-moderate LEFT PELVIS: Left Common Iliac Artery - Minimal Diameter-7.3 x 4.5 mm Tortuosity - mild Calcification-severe Left External Iliac Artery - Minimal Diameter-5.7 x 4.8 mm Tortuosity - mild Calcification-moderate Left Common Femoral Artery - Minimal Diameter-8.1 x 6.2 mm Tortuosity - mild Calcification-severe Review of the MIP images confirms the above findings. IMPRESSION: 1. Vascular findings and measurements pertinent to potential TAVR procedure, as detailed above.  2. Severe thickening calcification of the aortic valve, compatible with the reported clinical history of aortic stenosis. 3. Cardiomegaly with left atrial dilatation and filling defect in the tip of the left atrial appendage concerning for potential left atrial appendage thrombus. Attention at time of forthcoming transesophageal echocardiography is recommended. 4. Aortic atherosclerosis, in addition to left main and 3 vessel coronary artery disease. Status post median sternotomy for CABG including LIMA to the LAD. 5. Small amount of gas non dependently in the lumen of the urinary bladder. Statistically, this is likely iatrogenic from recent catheterization, however, if there is no recent history of catheterization, correlation with urinalysis would be recommended to exclude the possibility of urinary tract infection with gas-forming organisms. 6. Additional incidental findings, as above. Electronically Signed   By: Vinnie Langton M.D.   On: 10/21/2017 16:54   Ct Angio Abd/pel W/ And/or W/o  Result Date: 10/21/2017 CLINICAL DATA:  82 year old female with history of severe aortic stenosis. Preprocedural study prior to potential transcatheter aortic valve replacement (TAVR) procedure. EXAM: CT ANGIOGRAPHY CHEST, ABDOMEN AND PELVIS TECHNIQUE: Multidetector CT imaging through the chest, abdomen and pelvis was performed using the standard protocol during bolus administration of intravenous contrast. Multiplanar reconstructed images and MIPs were obtained and reviewed to evaluate the vascular anatomy. CONTRAST:  128mL ISOVUE-370 IOPAMIDOL (ISOVUE-370) INJECTION 76% COMPARISON:  Chest CT 08/25/2017. FINDINGS: CTA CHEST FINDINGS Cardiovascular: Cardiomegaly with left atrial dilatation. Filling defect in the tip of the left atrial appendage (axial image 51 of series 14), concerning for potential thrombus although this may alternatively represent pseudo thrombus. There is aortic atherosclerosis, as well as atherosclerosis  of the great vessels of the mediastinum and the coronary arteries, including calcified atherosclerotic plaque in the left main, left anterior descending, left circumflex and right coronary arteries. Status post median sternotomy for CABG including LIMA to the LAD. Severe thickening calcification of the aortic valve. Calcifications of the mitral annulus. Mediastinum/Lymph Nodes: No pathologically enlarged mediastinal or hilar lymph nodes. Esophagus is unremarkable in appearance. No axillary lymphadenopathy. Lungs/Pleura: No suspicious appearing pulmonary nodules or masses. No acute consolidative airspace disease. No pleural effusions. Musculoskeletal/Soft Tissues: There are no aggressive appearing lytic or blastic lesions noted in the visualized portions of the skeleton. Median sternotomy wires. CTA ABDOMEN AND PELVIS FINDINGS Hepatobiliary: Liver has a slightly shrunken appearance and nodular contour, indicative of underlying cirrhosis. No definite cystic or solid hepatic lesions. No intra or extrahepatic biliary ductal dilatation. Several small calcified gallstones lying dependently in the gallbladder. No surrounding inflammatory changes to suggest an acute cholecystitis at this time. Pancreas: No pancreatic mass. No pancreatic ductal dilatation. No pancreatic or peripancreatic fluid or inflammatory changes. Spleen: Unremarkable. Adrenals/Urinary Tract: Bilateral kidneys and bilateral adrenal glands are normal in appearance. No hydroureteronephrosis. Small amount of gas non dependently in the lumen of the urinary bladder. Urinary bladder is otherwise unremarkable in appearance. Stomach/Bowel: Normal appearance of stomach. No pathologic dilatation of small bowel or colon. Numerous colonic diverticulae are noted, without  surrounding inflammatory changes to suggest an acute diverticulitis at this time. Normal appendix. Vascular/Lymphatic: Aortic atherosclerosis, without evidence of aneurysm or dissection in the  abdominal or pelvic vasculature. Vascular findings and measurements pertinent to potential TAVR procedure, as detailed below. Celiac axis and inferior mesenteric artery are widely patent without hemodynamically significant stenosis. Atherosclerosis at the ostium of the superior mesenteric artery with what appears to be mild to moderate stenosis. Atherosclerosis at the ostium of the single right renal artery with mild to moderate stenosis. Left renal artery appears patent without hemodynamically significant stenosis. No lymphadenopathy noted in the abdomen or pelvis. Reproductive: Uterus and ovaries are unremarkable in appearance. Other: No significant volume of ascites.  No pneumoperitoneum. Musculoskeletal: There are no aggressive appearing lytic or blastic lesions noted in the visualized portions of the skeleton. VASCULAR MEASUREMENTS PERTINENT TO TAVR: AORTA: Minimal Aortic Diameter-12 x 10 mm Severity of Aortic Calcification-severe RIGHT PELVIS: Right Common Iliac Artery - Indwelling stent Minimal Diameter-4.9 x 4.1 mm Tortuosity-mild Calcification-severe Right External Iliac Artery - Minimal Diameter-4.4 x 4.6 mm Tortuosity - mild Calcification-moderate Right Common Femoral Artery - Minimal Diameter-5.8 x 6.6 mm Tortuosity - mild Calcification-moderate LEFT PELVIS: Left Common Iliac Artery - Minimal Diameter-7.3 x 4.5 mm Tortuosity - mild Calcification-severe Left External Iliac Artery - Minimal Diameter-5.7 x 4.8 mm Tortuosity - mild Calcification-moderate Left Common Femoral Artery - Minimal Diameter-8.1 x 6.2 mm Tortuosity - mild Calcification-severe Review of the MIP images confirms the above findings. IMPRESSION: 1. Vascular findings and measurements pertinent to potential TAVR procedure, as detailed above. 2. Severe thickening calcification of the aortic valve, compatible with the reported clinical history of aortic stenosis. 3. Cardiomegaly with left atrial dilatation and filling defect in the tip of the  left atrial appendage concerning for potential left atrial appendage thrombus. Attention at time of forthcoming transesophageal echocardiography is recommended. 4. Aortic atherosclerosis, in addition to left main and 3 vessel coronary artery disease. Status post median sternotomy for CABG including LIMA to the LAD. 5. Small amount of gas non dependently in the lumen of the urinary bladder. Statistically, this is likely iatrogenic from recent catheterization, however, if there is no recent history of catheterization, correlation with urinalysis would be recommended to exclude the possibility of urinary tract infection with gas-forming organisms. 6. Additional incidental findings, as above. Electronically Signed   By: Vinnie Langton M.D.   On: 10/21/2017 16:54     Diagnostic Studies/Procedures    TAVR OPERATIVE NOTE   Date of Procedure:11/08/2017  Preoperative Diagnosis:Severe Aortic Stenosis   Procedure:   Transcatheter Aortic Valve Replacement - PercutaneousLeftTransfemoral Approach Edwards Sapien 3 THV (size 9mm, model # 9600TFX, serial # [id number])  Co-Surgeons:Akiera Allbaugh Burt Knack, MD andClarence H. Roxy Manns, MD   Pre-operative Echo Findings: ? Severe aortic stenosis ? Normalleft ventricular systolic function  Post-operative Echo Findings: ? Trivialparavalvular leak ? Normalleft ventricular systolic function  ______________   Post operative echo 11/09/17 Study Conclusion - Left ventricle: The cavity size was mildly dilated. Wall   thickness was normal. Systolic function was normal. The estimated   ejection fraction was in the range of 50% to 55%. The study is   not technically sufficient to allow evaluation of LV diastolic   function. - Aortic valve: Normal appearing 26 mm Sapien 3 valve with no   significant peri valvular regurgitation. Valve area (VTI): 2.3   cm^2. Valve area (Vmean): 2.74 cm^2. -  Mitral valve: Calcified annulus. There was mild regurgitation.   Valve area by continuity equation (using LVOT flow):  3.4 cm^2. - Left atrium: The atrium was moderately to severely dilated. - Atrial septum: No defect or patent foramen ovale was identified.    Disposition   Pt is being discharged home today in good condition.  Follow-up Plans & Appointments    Follow-up Information    Eileen Stanford, PA-C. Go on 11/17/2017.   Specialties:  Cardiology, Radiology Why:  @ 2:30pm Contact information: [place] [phone]          Discharge Instructions    Amb Referral to Cardiac Rehabilitation   Complete by:  As directed    Diagnosis:  Valve Replacement   Valve:  Aortic Comment - TAVR      Discharge Medications     Medication List    STOP taking these medications   losartan 50 MG tablet Commonly known as:  COZAAR     TAKE these medications   acetaminophen 500 MG tablet Commonly known as:  TYLENOL Take 500 mg by mouth daily as needed for moderate pain or headache.   amLODipine 10 MG tablet Commonly known as:  NORVASC Take 1 tablet (10 mg total) by mouth daily. What changed:    medication strength  how much to take   apixaban 5 MG Tabs tablet Commonly known as:  ELIQUIS Take 1 tablet (5 mg total) by mouth 2 (two) times daily.   aspirin 81 MG chewable tablet Chew 1 tablet (81 mg total) by mouth daily.   atorvastatin 80 MG tablet Commonly known as:  LIPITOR Take 1 tablet (80 mg total) by mouth daily. What changed:  when to take this   furosemide 40 MG tablet Commonly known as:  LASIX Take 40 mg by mouth 2 (two) times daily.   glucose 4 GM chewable tablet Chew 4 tablets by mouth once as needed for low blood sugar.   insulin NPH Human 100 UNIT/ML injection Commonly known as:  HUMULIN N,NOVOLIN N Inject 0.15 mLs (15 Units total) into the skin 2 (two) times daily. 15 units in the morning and 10 units in  the PM What changed:    how much to take  when to take this  additional instructions   insulin regular 100 units/mL injection Commonly known as:  NOVOLIN R,HUMULIN R Inject 5 Units into the skin 2 (two) times daily.   metoprolol tartrate 25 MG tablet Commonly known as:  LOPRESSOR Take 0.5 tablets (12.5 mg total) by mouth 2 (two) times daily. What changed:  how much to take   NITROSTAT 0.4 MG SL tablet Generic drug:  nitroGLYCERIN Place 1 tablet under the tongue every 5 (five) minutes x 3 doses as needed for chest pain. Chest pain   ONE-A-DAY 50 PLUS PO Take 1 tablet by mouth daily.         Outstanding Labs/Studies   BMET   Duration of Discharge Encounter   Greater than 30 minutes including physician time.  Signed, Angelena Form PA-C 11/10/2017, 11:15 AM  Patient seen, examined. Available data reviewed. Agree with findings, assessment, and plan as outlined by Nell Range, PA-C.  The patient is independently interviewed and examined.  Exam is unchanged with normal JVP, clear lung fields, heart irregularly irregular with a grade 2/6 systolic ejection murmur at the right upper sternal border, abdomen is soft nontender, bilateral groin sites are clear, there is no pretibial edema.  Patient appears stable for hospital discharge.  She has done very well after TAVR and she states that she feels  much better.  Labs are reviewed and agree with medication changes as outlined above.  Post hospital follow-up as arranged.  Postoperative restrictions reviewed with the patient.  Sherren Mocha, M.D. 11/10/2017 12:13 PM

## 2017-11-10 NOTE — Progress Notes (Signed)
Notified by CCMD patient's heart rate dropped to 38 but rebounded back to 60's. Patient asymptomatic. Patient sleeping at that time. Will continue to monitor

## 2017-11-10 NOTE — Progress Notes (Signed)
CARDIAC REHAB PHASE I   PRE:  Rate/Rhythm: 73 afib    BP: sitting 132/87    SaO2:   MODE:  Ambulation: 160 ft with only slight GB assist, 470 ft with rollator   POST:  Rate/Rhythm: 91 afib    BP: sitting 154/76     SaO2: 92 RA  Pt feeling well this am, up in recliner. She stood and walked 160 ft in hall independently with light GB assist. She gets tired quicker without AD. She sat on EOB then walked 470 ft  With rollator, which is what she uses for distance at home. Tolerated well. Much more control with rollator compared to RW (yesterday). Pt feeling well and eager to go home.  Daniels, ACSM 11/10/2017 9:53 AM

## 2017-11-10 NOTE — Plan of Care (Signed)

## 2017-11-11 ENCOUNTER — Telehealth (HOSPITAL_COMMUNITY): Payer: Self-pay

## 2017-11-11 ENCOUNTER — Telehealth: Payer: Self-pay | Admitting: Physician Assistant

## 2017-11-11 NOTE — Telephone Encounter (Signed)
Pt insurance is active and benefits verified through Medicare Co-pay $0.00, DED $0.00/$0.00 met, out of pocket $0.00/$0.00 met, co-insurance 0%. No  pre-authorization required. Passport, 11/11/17 @ 10:13AM, REF#[id number] ° °Patients insurance is active through Medicaid. Reference #[id number] °Will fax over Medicaid Reimbursement form to Dr. Tilley ° °Will contact patient to see if she is interested in the Cardiac Rehab Program. If interested, patient will need to complete follow up appt. Once completed, patient will be contacted for scheduling upon review by the RN Navigator. °

## 2017-11-11 NOTE — Telephone Encounter (Signed)
Attempted to call patient in requrds to Cardiac Rehab - was not able to leave a VM

## 2017-11-11 NOTE — Telephone Encounter (Signed)
  [facility]   MULTIDISCIPLINARY HEART VALVE TEAM   Patient's daughter, [name], contacted regarding discharge from [facility] on 11/11/17  Patient understands to follow up with provider Nell Range on 8/1 at [facility].  Patient understands discharge instructions? yes Patient understands medications and regiment? yes Patient understands to bring all medications to this visit? yes  Angelena Form PA-C  [facility]

## 2017-11-11 NOTE — Telephone Encounter (Signed)
  [place] VALVE TEAM   Patient contacted regarding discharge from [facility] on 11/10/17 (date on previous phone note incorrect) Patient understands to follow up with provider Nell Range on 8/1 at [facility].  Patient understands discharge instructions? yes Patient understands medications and regiment? yes Patient understands to bring all medications to this visit? yes  Angelena Form PA-C  [facility]

## 2017-11-15 NOTE — Progress Notes (Addendum)
HEART AND [place]                                       Cardiology Office Note    Date:  11/17/2017   ID:  Kristina Summers, DOB 07-Apr-1936, MRN [id number]  PCP:  [facility], [place]  Cardiologist: Dr. [name] / Dr. Burt Knack & Dr. Roxy Manns (TAVR)  CC: [facility] s/p TAVR  History of Present Illness:  Kristina Summers is a 82 y.o. female with a history of CAD s/p CABGx4V(2000), CVA with residual expressive aphasia, GI bleeding, PAD s/p several LE interventions, chronic diastolic CHF,anemia, CKD, DMT2, PAF on Eliquis and severe ASs/p TAVR (11/08/17) who presents to clinic for follow up.   She has a history of inferior MI in 1992 and ultimately was treated with multivessel CABG in 2000 by Dr Cyndia Bent. In 2016 the patient had a stroke and has residual expressive aphasia. She ambulates with a walker, but remains functionally independent. She has been anticoagulated with apixaban for secondary stroke prevention since then. She previously had GI bleeding on warfarin and this had been discontinued. The patient also has significant PAD and has undergone several revascularization procedures including right common iliac stenting and orbital atherectomy/PTA procedures on the left SFA. She ambulates with a walker and has fairly advanced vision loss.Despite all of her medical problems, she remains independent.   She has recently been admitted twice over the past year (since 01/2017) with acute CHF. 2D ECHO 08/26/17 showed EF 50-55% and severe AS with AVA 0.9 cm2 and mean gradient (S): 21 mm Hg; peak gradient (S): 34 mm Hg.Shewas seen by Dr. Judeth Porch the multidisciplinary valve clinicfor evaluation of possible TAVR. He was suspicious that she had severe AS with a highly calcified aortic valve with very limited leaflet mobility. Diagnostic L/RHC on 10/11/17.showed severe native vessel CAD withcontinued patency of the SVG to PDA, SVG to OM, SVG to diagonal, and LIMA to LADand severe AS  with amean transvalvular gradient of 43 mmHg and calculated aortic valve area of 0.8 cm.  She underwent successful TAVR with a 26 mm Edwards Sapien THV via the TF approach on 11/08/17. Post operative echo showed EF 50-55%, normally functioning TAVR valve with mean gradient of 10 mm Hg and no PVL. She was treated with one dose of IV lasix. Creat bumped to 1.4. Home Losartan was held. Metoprolol was reduced given post op bradycardia and amlodipine increased for hypertension. She was started on ASA 81mg  daily and home Eliquis resumed.   Today she presents to clinic for follow up. No CP or SOB. No LE edema. She has chronic 2 pillow orthopnea which is unchanged but no PND. No dizziness or syncope. No blood in stool or urine. No palpitations. She can tell a huge difference in her breathing. She can walk up a hill by her house with no issues when before she had to stop several times.     Past Medical History:  Diagnosis Date  . Anemia   . CAD (coronary artery disease)    a. s/p CABG in 2000  . Carotid artery occlusion   . CKD (chronic kidney disease)   . Diastolic CHF ([place]) 51/8841  . Fibromyalgia   . GERD (gastroesophageal reflux disease)   . History of CVA (cerebrovascular accident) 05/24/2014  . History of GI bleed   . Hyperlipidemia   . Hypertension   .  Obesity   . PAF (paroxysmal atrial fibrillation) (H[facility])    a. on Eliquis  . Peripheral vascular disease ([place])   . 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 Type II diabetes mellitus ([place]) dx'd 1979    Past Surgical History:  Procedure Laterality Date  . ABDOMINAL AORTAGRAM N/A 04/01/2011   Procedure: ABDOMINAL AORTAGRAM;  Surgeon: Conrad [place], MD;  Location: [facility] C[facility] LAB;  Service: Cardiovascular;  Laterality: N/A;  . ANGIOPLASTY  06/17/11   Left leg common femoral artery cannulation under u/s Left leg runoff  .  CARDIAC CATHETERIZATION  10/11/2017  . CARPAL TUNNEL RELEASE Right   . CATARACT EXTRACTION W/ INTRAOCULAR LENS  IMPLANT, BILATERAL  2004-2005  . CORONARY ARTERY BYPASS GRAFT  2000   CABG X5  . EYE SURGERY Left    "lasered before cataract OR"  . LOWER EXTREMITY ANGIOGRAM Bilateral 04/01/2011   Procedure: LOWER EXTREMITY ANGIOGRAM;  Surgeon: Conrad [place], MD;  Location: [facility] CATH LAB;  Service: Cardiovascular;  Laterality: Bilateral;  . LOWER EXTREMITY ANGIOGRAM Left 06/17/2011   Procedure: LOWER EXTREMITY ANGIOGRAM;  Surgeon: Conrad [place], MD;  Location: [facility] CATH LAB;  Service: Cardiovascular;  Laterality: Left;  . LOWER EXTREMITY ANGIOGRAM N/A 11/18/2011   Procedure: LOWER EXTREMITY ANGIOGRAM;  Surgeon: Conrad Blende, MD;  Location: [facility] CATH LAB;  Service: Cardiovascular;  Laterality: N/A;  . PERCUTANEOUS STENT INTERVENTION Right 04/01/2011   Procedure: PERCUTANEOUS STENT INTERVENTION;  Surgeon: Conrad [place], MD;  Location: [facility] CATH LAB;  Service: Cardiovascular;  Laterality: Right;  rt iliac stent  . PERIPHERAL ARTERIAL STENT GRAFT  04/01/11   right common iliac  . RIGHT/LEFT HEART CATH AND CORONARY/GRAFT ANGIOGRAPHY N/A 10/11/2017   Procedure: RIGHT/LEFT HEART CATH AND CORONARY/GRAFT ANGIOGRAPHY;  Surgeon: Sherren Mocha, MD;  Location: [place] CV LAB;  Service: Cardiovascular;  Laterality: N/A;  . TEE WITHOUT CARDIOVERSION N/A 11/08/2017   Procedure: TRANSESOPHAGEAL ECHOCARDIOGRAM (TEE);  Surgeon: Sherren Mocha, MD;  Location: [place];  Service: Open Heart Surgery;  Laterality: N/A;  . TRANSCATHETER AORTIC VALVE REPLACEMENT, TRANSFEMORAL N/A 11/08/2017   Procedure: TRANSCATHETER AORTIC VALVE REPLACEMENT, TRANSFEMORAL;  Surgeon: Sherren Mocha, MD;  Location: [place];  Service: Open Heart Surgery;  Laterality: N/A;  . TRIGGER FINGER RELEASE Left 1996   thumb    Current Medications: Outpatient Medications Prior to Visit  Medication Sig Dispense Refill  . acetaminophen (TYLENOL) 500 MG tablet  Take 500 mg by mouth daily as needed for moderate pain or headache.    Marland Kitchen amLODipine (NORVASC) 10 MG tablet Take 1 tablet (10 mg total) by mouth daily. 30 tablet 5  . apixaban (ELIQUIS) 5 MG TABS tablet Take 1 tablet (5 mg total) by mouth 2 (two) times daily. 60 tablet 0  . aspirin 81 MG chewable tablet Chew 1 tablet (81 mg total) by mouth daily.    Marland Kitchen atorvastatin (LIPITOR) 80 MG tablet Take 1 tablet (80 mg total) by mouth daily. (Patient taking differently: Take 80 mg by mouth at bedtime. ) 30 tablet 0  . furosemide (LASIX) 40 MG tablet Take 40 mg by mouth 2 (two) times daily.  0  . glucose 4 GM chewable tablet Chew 4 tablets by mouth once as needed for low blood sugar.     . insulin NPH Human (HUMULIN N,NOVOLIN N) 100 UNIT/ML injection Inject 0.15 mLs (15 Units total) into the skin 2 (two)  times daily. 15 units in the morning and 10 units in the PM (Patient taking differently: Inject 10-15 Units into the skin See admin instructions. 15 units in the morning and 10 units in the PM) 10 mL 11  . insulin regular (NOVOLIN R,HUMULIN R) 100 units/mL injection Inject 5 Units into the skin 2 (two) times daily.    . metoprolol tartrate (LOPRESSOR) 25 MG tablet Take 0.5 tablets (12.5 mg total) by mouth 2 (two) times daily. 30 tablet 6  . Multiple Vitamins-Minerals (ONE-A-DAY 50 PLUS PO) Take 1 tablet by mouth daily.    Marland Kitchen NITROSTAT 0.4 MG SL tablet Place 1 tablet under the tongue every 5 (five) minutes x 3 doses as needed for chest pain. Chest pain  1   No facility-administered medications prior to visit.      Allergies:   Morphine and related; Lisinopril; and Zoloft [sertraline hcl]   Social History   Socioeconomic History  . Marital status: Divorced    Spouse name: Not on file  . Number of children: 3  . Years of education: 55  . Highest education level: Not on file  Occupational History  . Occupation: Retired  Scientific laboratory technician  . Financial resource strain: Not on file  . Food insecurity:    Worry:  Not on file    Inability: Not on file  . Transportation needs:    Medical: Not on file    Non-medical: Not on file  Tobacco Use  . Smoking status: Former Smoker    Packs/day: 2.00    Years: 30.00    Pack years: 60.00    Types: Cigarettes    Last attempt to quit: 04/19/1990    Years since quitting: 27.6  . Smokeless tobacco: Never Used  . Tobacco comment: stopped smoking cigarettes 1991  Substance and Sexual Activity  . Alcohol use: Not Currently    Alcohol/week: 0.0 oz    Comment: "tried different alcohols when I was 1st married; never drank much at  ALL"  . Drug use: Never  . Sexual activity: Not Currently  Lifestyle  . Physical activity:    Days per week: Not on file    Minutes per session: Not on file  . Stress: Not on file  Relationships  . Social connections:    Talks on phone: Not on file    Gets together: Not on file    Attends religious service: Not on file    Active member of club or organization: Not on file    Attends meetings of clubs or organizations: Not on file    Relationship status: Not on file  Other Topics Concern  . Not on file  Social History Narrative   Divorced.  Native of [place].  Formerly worked as Education administrator person. 01/13/17 lives alone   Caffeine use: drinks decaf tea and coffee     Family History:  The patient's family history includes Diabetes in her brother; Other in her brother.      ROS:   Please see the history of present illness.    ROS All other systems reviewed and are negative.   PHYSICAL EXAM:   VS:  BP (!) 138/52   Pulse 74   Ht 5\' 6"  (1.676 m)   Wt 193 lb 6.4 oz (87.7 kg)   SpO2 96%   BMI 31.22 kg/m    GEN: Well nourished, well developed, in no acute distress, obese HEENT: normal  Neck: no JVD, carotid bruits, or masses Cardiac: irreg irreg; soft flow  murmurs. No rubs, or gallops,no edema  Respiratory:  clear to auscultation bilaterally, normal work of breathing GI: soft, nontender, nondistended, + BS MS: no deformity  or atrophy  Skin: warm and dry, no rash Neuro:  Alert and Oriented x 3, Strength and sensation are intact Psych: euthymic mood, full affect    Wt Readings from Last 3 Encounters:  11/17/17 193 lb 6.4 oz (87.7 kg)  11/10/17 195 lb (88.5 kg)  11/04/17 193 lb 6.4 oz (87.7 kg)      Studies/Labs Reviewed:   EKG:  EKG is ordered today.  The ekg ordered today demonstrates atrial fibrillation, LBBB HR 74  Recent Labs: 11/04/2017: ALT 24; B Natriuretic Peptide 972.6 11/09/2017: Magnesium 2.2 11/10/2017: BUN 34; Creatinine, Ser 1.45; Hemoglobin 9.9; Platelets 127; Potassium 4.2; Sodium 141   Lipid Panel    Component Value Date/Time   CHOL 92 06/18/202016 1006   TRIG 165 (H) 06/18/202016 1006   HDL 11 (L) 06/18/202016 1006   CHOLHDL 8.4 06/18/202016 1006   VLDL 33 06/18/202016 1006   LDLCALC 48 06/18/202016 1006    Additional studies/ records that were reviewed today include:  TAVR OPERATIVE NOTE   Date of Procedure:11/08/2017  Preoperative Diagnosis:Severe Aortic Stenosis   Procedure:   Transcatheter Aortic Valve Replacement - PercutaneousLeftTransfemoral Approach Edwards Sapien 3 THV (size 3mm, model # 9600TFX, serial # [id number])  Co-Surgeons:Michael Burt Knack, MD andClarence H. Roxy Manns, MD   Pre-operative Echo Findings: ? Severe aortic stenosis ? Normalleft ventricular systolic function  Post-operative Echo Findings: ? Trivialparavalvular leak ? Normalleft ventricular systolic function  ______________   Post operative echo 11/09/17 Study Conclusion - Left ventricle: The cavity size was mildly dilated. Wall thickness was normal. Systolic function was normal. The estimated ejection fraction was in the range of 50% to 55%. The study is not technically sufficient to allow evaluation of LV diastolic function. - Aortic valve: Normal appearing 26 mm Sapien 3 valve with no significant peri  valvular regurgitation. Valve area (VTI): 2.3 cm^2. Valve area (Vmean): 2.74 cm^2. - Mitral valve: Calcified annulus. There was mild regurgitation. Valve area by continuity equation (using LVOT flow): 3.4 cm^2. - Left atrium: The atrium was moderately to severely dilated. - Atrial septum: No defect or patent foramen ovale was identified.    ASSESSMENT & PLAN:   Severe AS s/p TAVR:  doing excellent. She feels so much better since TAVR. Groin sites are healing well. SBE prophylaxis discussed. She does not see a dentist given full dentures.  Continue ASA and Eliquis. I will see her back for 1 month echo and follow up in valve clinic.   Chronic diastolic CHF:  Appears euvolemic. Continue Lasix at current dosing. BMET today  Chronic atrial fibrillation: rate well controlled. Continue Eliquis  CKD stage III: creat bumped to 1.45 at discharge. Losartan has been on hold. Will check a Bmet today.   HTN: BP well controlled. If her kidney function is back to baseline (~1.10-1.2), I will resume her home Losartan 50mg  daily (used to be BID).  CAD: recent cath showed patent bypass grafts. Continue medical therapy with ASA, BB and statin   Medication Adjustments/Labs and Tests Ordered: Current medicines are reviewed at length with the patient today.  Concerns regarding medicines are outlined above.  Medication changes, Labs and Tests ordered today are listed in the Patient Instructions below. Patient Instructions  Medication Instructions:  Your provider recommends that you continue on your current medications as directed. Please refer to the Current Medication list given to you  today.    Labwork: TODAY: BMET  Testing/Procedures: You have an echocardiogram scheduled 8/29. Please arrive at 1:30PM for this appointment.  Follow-Up: You have follow-up scheduled with Nell Range, PA on 8/29 after your echocardiogram.     Signed, Angelena Form, PA-C  11/17/2017 3:31 PM    [facility] [place], [place], [place]  [address] Phone: [phone]; Fax: [phone]

## 2017-11-17 ENCOUNTER — Ambulatory Visit (INDEPENDENT_AMBULATORY_CARE_PROVIDER_SITE_OTHER): Payer: Medicare Other | Admitting: Physician Assistant

## 2017-11-17 ENCOUNTER — Encounter (INDEPENDENT_AMBULATORY_CARE_PROVIDER_SITE_OTHER): Payer: Self-pay

## 2017-11-17 ENCOUNTER — Encounter: Payer: Self-pay | Admitting: Physician Assistant

## 2017-11-17 VITALS — BP 138/52 | HR 74 | Ht 66.0 in | Wt 193.4 lb

## 2017-11-17 DIAGNOSIS — I2510 Atherosclerotic heart disease of native coronary artery without angina pectoris: Secondary | ICD-10-CM

## 2017-11-17 DIAGNOSIS — I5032 Chronic diastolic (congestive) heart failure: Secondary | ICD-10-CM

## 2017-11-17 DIAGNOSIS — I10 Essential (primary) hypertension: Secondary | ICD-10-CM

## 2017-11-17 DIAGNOSIS — I482 Chronic atrial fibrillation, unspecified: Secondary | ICD-10-CM

## 2017-11-17 DIAGNOSIS — Z952 Presence of prosthetic heart valve: Secondary | ICD-10-CM

## 2017-11-17 DIAGNOSIS — N189 Chronic kidney disease, unspecified: Secondary | ICD-10-CM | POA: Diagnosis not present

## 2017-11-17 NOTE — Patient Instructions (Addendum)
Medication Instructions:  Your provider recommends that you continue on your current medications as directed. Please refer to the Current Medication list given to you today.    Labwork: TODAY: BMET  Testing/Procedures: You have an echocardiogram scheduled 8/29. Please arrive at 1:30PM for this appointment.  Follow-Up: You have follow-up scheduled with Nell Range, PA on 8/29 after your echocardiogram.

## 2017-11-18 ENCOUNTER — Telehealth: Payer: Self-pay

## 2017-11-18 LAB — BASIC METABOLIC PANEL
BUN / CREAT RATIO: 26 (ref 12–28)
BUN: 28 mg/dL — ABNORMAL HIGH (ref 8–27)
CHLORIDE: 100 mmol/L (ref 96–106)
CO2: 25 mmol/L (ref 20–29)
CREATININE: 1.08 mg/dL — AB (ref 0.57–1.00)
Calcium: 9.5 mg/dL (ref 8.7–10.3)
GFR calc Af Amer: 56 mL/min/{1.73_m2} — ABNORMAL LOW (ref >59)
GFR calc non Af Amer: 49 mL/min/{1.73_m2} — ABNORMAL LOW (ref >59)
GLUCOSE: 137 mg/dL — AB (ref 65–99)
POTASSIUM: 4.2 mmol/L (ref 3.5–5.2)
SODIUM: 140 mmol/L (ref 134–144)

## 2017-11-18 NOTE — Telephone Encounter (Signed)
Notes recorded by Frederik Schmidt, RN on 11/18/2017 at 8:17 AM EDT Informed patient of results/recommendations. She verbalized understanding. I did not see Losartan restarted on medication list per OV 8/1. Please confirm, thank you. ------

## 2017-11-18 NOTE — Telephone Encounter (Signed)
-----   Message from Charlie Pitter, [place] sent at 11/18/2017  8:03 AM EDT ----- Covering Katie's inbox for today. Please let patient know kidney function is stable. Katie restarted losartan at visit yesterday. I will leave this result in her box to decide whether she thinks they need a f/u BMET since ARB was resumed at visit. Dayna Dunn PA-C

## 2017-11-18 NOTE — Telephone Encounter (Signed)
Spoke to patient and informed her of Dayna's recommendation.  She verbalized understanding.

## 2017-11-18 NOTE — Telephone Encounter (Signed)
I will allow Katie to review when she comes back next week. Dayna Dunn PA-C

## 2017-11-21 ENCOUNTER — Encounter: Payer: Self-pay | Admitting: Thoracic Surgery (Cardiothoracic Vascular Surgery)

## 2017-11-22 ENCOUNTER — Other Ambulatory Visit: Payer: Self-pay

## 2017-11-22 ENCOUNTER — Telehealth: Payer: Self-pay

## 2017-11-22 MED ORDER — LOSARTAN POTASSIUM 50 MG PO TABS: 50.0000 mg | ORAL_TABLET | Freq: Every day | ORAL | 3 refills | Status: DC

## 2017-11-22 NOTE — Telephone Encounter (Signed)
Notes recorded by Frederik Schmidt, RN on 11/22/2017 at 9:14 AM EDT Informed patient of results/recommendations. She verbalized understanding.

## 2017-11-22 NOTE — Telephone Encounter (Signed)
-----   Message from Eileen Stanford, PA-C sent at 11/21/2017  7:17 PM EDT ----- You are right I hadn't started ARB yet. Lets restart Losartan 50mg  daily. Thank you!

## 2017-12-11 ENCOUNTER — Inpatient Hospital Stay (HOSPITAL_COMMUNITY)
Admission: EM | Admit: 2017-12-11 | Discharge: 2017-12-16 | DRG: 871 | Disposition: A | Discharge status: Home Health | Payer: Medicare Other | Attending: Internal Medicine | Admitting: Internal Medicine

## 2017-12-11 ENCOUNTER — Emergency Department (HOSPITAL_COMMUNITY): Payer: Medicare Other

## 2017-12-11 ENCOUNTER — Other Ambulatory Visit: Payer: Self-pay

## 2017-12-11 ENCOUNTER — Encounter (HOSPITAL_COMMUNITY): Payer: Self-pay

## 2017-12-11 DIAGNOSIS — N390 Urinary tract infection, site not specified: Secondary | ICD-10-CM | POA: Diagnosis present

## 2017-12-11 DIAGNOSIS — I10 Essential (primary) hypertension: Secondary | ICD-10-CM | POA: Diagnosis not present

## 2017-12-11 DIAGNOSIS — Z87891 Personal history of nicotine dependence: Secondary | ICD-10-CM

## 2017-12-11 DIAGNOSIS — I351 Nonrheumatic aortic (valve) insufficiency: Secondary | ICD-10-CM | POA: Diagnosis not present

## 2017-12-11 DIAGNOSIS — Z7901 Long term (current) use of anticoagulants: Secondary | ICD-10-CM

## 2017-12-11 DIAGNOSIS — I480 Paroxysmal atrial fibrillation: Secondary | ICD-10-CM | POA: Diagnosis present

## 2017-12-11 DIAGNOSIS — M797 Fibromyalgia: Secondary | ICD-10-CM | POA: Diagnosis present

## 2017-12-11 DIAGNOSIS — Z953 Presence of xenogenic heart valve: Secondary | ICD-10-CM

## 2017-12-11 DIAGNOSIS — Z794 Long term (current) use of insulin: Secondary | ICD-10-CM | POA: Diagnosis not present

## 2017-12-11 DIAGNOSIS — A419 Sepsis, unspecified organism: Secondary | ICD-10-CM

## 2017-12-11 DIAGNOSIS — E1122 Type 2 diabetes mellitus with diabetic chronic kidney disease: Secondary | ICD-10-CM | POA: Diagnosis not present

## 2017-12-11 DIAGNOSIS — Z961 Presence of intraocular lens: Secondary | ICD-10-CM | POA: Diagnosis present

## 2017-12-11 DIAGNOSIS — E1151 Type 2 diabetes mellitus with diabetic peripheral angiopathy without gangrene: Secondary | ICD-10-CM | POA: Diagnosis not present

## 2017-12-11 DIAGNOSIS — E11621 Type 2 diabetes mellitus with foot ulcer: Secondary | ICD-10-CM | POA: Diagnosis present

## 2017-12-11 DIAGNOSIS — Z951 Presence of aortocoronary bypass graft: Secondary | ICD-10-CM | POA: Diagnosis not present

## 2017-12-11 DIAGNOSIS — K219 Gastro-esophageal reflux disease without esophagitis: Secondary | ICD-10-CM | POA: Diagnosis present

## 2017-12-11 DIAGNOSIS — W1830XA Fall on same level, unspecified, initial encounter: Secondary | ICD-10-CM | POA: Diagnosis present

## 2017-12-11 DIAGNOSIS — Z888 Allergy status to other drugs, medicaments and biological substances status: Secondary | ICD-10-CM | POA: Diagnosis not present

## 2017-12-11 DIAGNOSIS — G9341 Metabolic encephalopathy: Secondary | ICD-10-CM | POA: Diagnosis present

## 2017-12-11 DIAGNOSIS — N183 Chronic kidney disease, stage 3 (moderate): Secondary | ICD-10-CM | POA: Diagnosis present

## 2017-12-11 DIAGNOSIS — I2510 Atherosclerotic heart disease of native coronary artery without angina pectoris: Secondary | ICD-10-CM | POA: Diagnosis present

## 2017-12-11 DIAGNOSIS — A4102 Sepsis due to Methicillin resistant Staphylococcus aureus: Secondary | ICD-10-CM | POA: Diagnosis present

## 2017-12-11 DIAGNOSIS — E785 Hyperlipidemia, unspecified: Secondary | ICD-10-CM | POA: Diagnosis present

## 2017-12-11 DIAGNOSIS — Z9842 Cataract extraction status, left eye: Secondary | ICD-10-CM

## 2017-12-11 DIAGNOSIS — Z885 Allergy status to narcotic agent status: Secondary | ICD-10-CM

## 2017-12-11 DIAGNOSIS — I739 Peripheral vascular disease, unspecified: Secondary | ICD-10-CM | POA: Diagnosis present

## 2017-12-11 DIAGNOSIS — A4151 Sepsis due to Escherichia coli [E. coli]: Secondary | ICD-10-CM | POA: Diagnosis present

## 2017-12-11 DIAGNOSIS — R4701 Aphasia: Secondary | ICD-10-CM | POA: Diagnosis not present

## 2017-12-11 DIAGNOSIS — W19XXXA Unspecified fall, initial encounter: Secondary | ICD-10-CM

## 2017-12-11 DIAGNOSIS — R7881 Bacteremia: Secondary | ICD-10-CM

## 2017-12-11 DIAGNOSIS — I340 Nonrheumatic mitral (valve) insufficiency: Secondary | ICD-10-CM | POA: Diagnosis not present

## 2017-12-11 DIAGNOSIS — Z9841 Cataract extraction status, right eye: Secondary | ICD-10-CM

## 2017-12-11 DIAGNOSIS — E860 Dehydration: Secondary | ICD-10-CM

## 2017-12-11 DIAGNOSIS — R509 Fever, unspecified: Secondary | ICD-10-CM

## 2017-12-11 DIAGNOSIS — N3000 Acute cystitis without hematuria: Secondary | ICD-10-CM | POA: Diagnosis not present

## 2017-12-11 DIAGNOSIS — Z952 Presence of prosthetic heart valve: Secondary | ICD-10-CM | POA: Diagnosis not present

## 2017-12-11 DIAGNOSIS — B9562 Methicillin resistant Staphylococcus aureus infection as the cause of diseases classified elsewhere: Secondary | ICD-10-CM | POA: Diagnosis not present

## 2017-12-11 DIAGNOSIS — Z7982 Long term (current) use of aspirin: Secondary | ICD-10-CM

## 2017-12-11 DIAGNOSIS — Z452 Encounter for adjustment and management of vascular access device: Secondary | ICD-10-CM | POA: Diagnosis not present

## 2017-12-11 DIAGNOSIS — Z87440 Personal history of urinary (tract) infections: Secondary | ICD-10-CM

## 2017-12-11 DIAGNOSIS — Z6829 Body mass index (BMI) 29.0-29.9, adult: Secondary | ICD-10-CM

## 2017-12-11 DIAGNOSIS — I5030 Unspecified diastolic (congestive) heart failure: Secondary | ICD-10-CM | POA: Diagnosis not present

## 2017-12-11 DIAGNOSIS — I69320 Aphasia following cerebral infarction: Secondary | ICD-10-CM | POA: Diagnosis not present

## 2017-12-11 DIAGNOSIS — I5033 Acute on chronic diastolic (congestive) heart failure: Secondary | ICD-10-CM | POA: Diagnosis present

## 2017-12-11 DIAGNOSIS — Z79899 Other long term (current) drug therapy: Secondary | ICD-10-CM | POA: Diagnosis not present

## 2017-12-11 DIAGNOSIS — L97519 Non-pressure chronic ulcer of other part of right foot with unspecified severity: Secondary | ICD-10-CM | POA: Diagnosis not present

## 2017-12-11 DIAGNOSIS — E1129 Type 2 diabetes mellitus with other diabetic kidney complication: Secondary | ICD-10-CM | POA: Diagnosis present

## 2017-12-11 DIAGNOSIS — I482 Chronic atrial fibrillation: Secondary | ICD-10-CM | POA: Diagnosis present

## 2017-12-11 DIAGNOSIS — Z8673 Personal history of transient ischemic attack (TIA), and cerebral infarction without residual deficits: Secondary | ICD-10-CM

## 2017-12-11 DIAGNOSIS — Q211 Atrial septal defect: Secondary | ICD-10-CM

## 2017-12-11 DIAGNOSIS — Z66 Do not resuscitate: Secondary | ICD-10-CM | POA: Diagnosis present

## 2017-12-11 DIAGNOSIS — Z833 Family history of diabetes mellitus: Secondary | ICD-10-CM

## 2017-12-11 DIAGNOSIS — E669 Obesity, unspecified: Secondary | ICD-10-CM | POA: Diagnosis present

## 2017-12-11 DIAGNOSIS — N184 Chronic kidney disease, stage 4 (severe): Secondary | ICD-10-CM | POA: Diagnosis present

## 2017-12-11 DIAGNOSIS — R739 Hyperglycemia, unspecified: Secondary | ICD-10-CM

## 2017-12-11 DIAGNOSIS — I130 Hypertensive heart and chronic kidney disease with heart failure and stage 1 through stage 4 chronic kidney disease, or unspecified chronic kidney disease: Secondary | ICD-10-CM | POA: Diagnosis not present

## 2017-12-11 DIAGNOSIS — D631 Anemia in chronic kidney disease: Secondary | ICD-10-CM | POA: Diagnosis present

## 2017-12-11 LAB — CBC WITH DIFFERENTIAL/PLATELET
Abs Immature Granulocytes: 0 10*3/uL (ref 0.0–0.1)
BASOS ABS: 0.1 10*3/uL (ref 0.0–0.1)
Basophils Relative: 1 %
EOS ABS: 0 10*3/uL (ref 0.0–0.7)
Eosinophils Relative: 0 %
HEMATOCRIT: 35 % — AB (ref 36.0–46.0)
Hemoglobin: 10.8 g/dL — ABNORMAL LOW (ref 12.0–15.0)
IMMATURE GRANULOCYTES: 0 %
LYMPHS ABS: 0.4 10*3/uL — AB (ref 0.7–4.0)
LYMPHS PCT: 3 %
MCH: 28.3 pg (ref 26.0–34.0)
MCHC: 30.9 g/dL (ref 30.0–36.0)
MCV: 91.6 fL (ref 78.0–100.0)
Monocytes Absolute: 0.5 10*3/uL (ref 0.1–1.0)
Monocytes Relative: 5 %
NEUTROS PCT: 91 %
Neutro Abs: 10 10*3/uL — ABNORMAL HIGH (ref 1.7–7.7)
Platelets: 183 10*3/uL (ref 150–400)
RBC: 3.82 MIL/uL — ABNORMAL LOW (ref 3.87–5.11)
RDW: 14.8 % (ref 11.5–15.5)
WBC: 11 10*3/uL — AB (ref 4.0–10.5)

## 2017-12-11 LAB — COMPREHENSIVE METABOLIC PANEL
ALT: 20 U/L (ref 0–44)
AST: 37 U/L (ref 15–41)
Albumin: 2.9 g/dL — ABNORMAL LOW (ref 3.5–5.0)
Alkaline Phosphatase: 120 U/L (ref 38–126)
Anion gap: 9 (ref 5–15)
BILIRUBIN TOTAL: 1 mg/dL (ref 0.3–1.2)
BUN: 23 mg/dL (ref 8–23)
CHLORIDE: 97 mmol/L — AB (ref 98–111)
CO2: 25 mmol/L (ref 22–32)
CREATININE: 1.18 mg/dL — AB (ref 0.44–1.00)
Calcium: 9.1 mg/dL (ref 8.9–10.3)
GFR calc Af Amer: 49 mL/min — ABNORMAL LOW (ref >60)
GFR, EST NON AFRICAN AMERICAN: 42 mL/min — AB (ref >60)
Glucose, Bld: 402 mg/dL — ABNORMAL HIGH (ref 70–99)
POTASSIUM: 4.4 mmol/L (ref 3.5–5.1)
Sodium: 131 mmol/L — ABNORMAL LOW (ref 135–145)
TOTAL PROTEIN: 7.8 g/dL (ref 6.5–8.1)

## 2017-12-11 LAB — LACTIC ACID, PLASMA: Lactic Acid, Venous: 2.7 mmol/L (ref 0.5–1.9)

## 2017-12-11 LAB — BRAIN NATRIURETIC PEPTIDE: B Natriuretic Peptide: 1270 pg/mL — ABNORMAL HIGH (ref 0.0–100.0)

## 2017-12-11 LAB — URINALYSIS, ROUTINE W REFLEX MICROSCOPIC
BILIRUBIN URINE: NEGATIVE
KETONES UR: NEGATIVE mg/dL
Nitrite: NEGATIVE
Protein, ur: >=300 mg/dL — AB
SPECIFIC GRAVITY, URINE: 1.015 (ref 1.005–1.030)
WBC, UA: >50 WBC/hpf — ABNORMAL HIGH (ref 0–5)
pH: 5 (ref 5.0–8.0)

## 2017-12-11 LAB — GLUCOSE, CAPILLARY: GLUCOSE-CAPILLARY: 314 mg/dL — AB (ref 70–99)

## 2017-12-11 LAB — I-STAT CG4 LACTIC ACID, ED: LACTIC ACID, VENOUS: 1.84 mmol/L (ref 0.5–1.9)

## 2017-12-11 LAB — PROCALCITONIN: PROCALCITONIN: 0.46 ng/mL

## 2017-12-11 MED ORDER — ZOLPIDEM TARTRATE 5 MG PO TABS: 5.0000 mg | ORAL_TABLET | Freq: Every evening | ORAL | Status: DC | PRN

## 2017-12-11 MED ORDER — ONDANSETRON HCL 4 MG/2ML IJ SOLN: 4.0000 mg | Freq: Four times a day (QID) | INTRAMUSCULAR | Status: DC | PRN

## 2017-12-11 MED ORDER — NITROGLYCERIN 0.4 MG SL SUBL: 0.4000 mg | SUBLINGUAL_TABLET | SUBLINGUAL | Status: DC | PRN

## 2017-12-11 MED ORDER — INSULIN NPH (HUMAN) (ISOPHANE) 100 UNIT/ML ~~LOC~~ SUSP
10.0000 [IU] | Freq: Every day | SUBCUTANEOUS | Status: DC
  Administered 2017-12-12 – 2017-12-14 (×3): 10 [IU] via SUBCUTANEOUS
  Filled 2017-12-11: qty 10

## 2017-12-11 MED ORDER — SENNOSIDES-DOCUSATE SODIUM 8.6-50 MG PO TABS: 1.0000 | ORAL_TABLET | Freq: Every evening | ORAL | Status: DC | PRN

## 2017-12-11 MED ORDER — ADULT MULTIVITAMIN W/MINERALS CH
1.0000 | ORAL_TABLET | Freq: Every day | ORAL | Status: DC
  Administered 2017-12-12 – 2017-12-16 (×5): 1 via ORAL
  Filled 2017-12-11 (×5): qty 1

## 2017-12-11 MED ORDER — SODIUM CHLORIDE 0.9 % IV BOLUS
1000.0000 mL | Freq: Once | INTRAVENOUS | Status: AC
  Administered 2017-12-11: 1000 mL via INTRAVENOUS

## 2017-12-11 MED ORDER — APIXABAN 5 MG PO TABS
5.0000 mg | ORAL_TABLET | Freq: Two times a day (BID) | ORAL | Status: DC
  Administered 2017-12-11 – 2017-12-16 (×10): 5 mg via ORAL
  Filled 2017-12-11 (×10): qty 1

## 2017-12-11 MED ORDER — INSULIN NPH (HUMAN) (ISOPHANE) 100 UNIT/ML ~~LOC~~ SUSP
7.0000 [IU] | Freq: Every day | SUBCUTANEOUS | Status: DC
  Administered 2017-12-11 – 2017-12-13 (×3): 7 [IU] via SUBCUTANEOUS
  Filled 2017-12-11: qty 10

## 2017-12-11 MED ORDER — ATORVASTATIN CALCIUM 80 MG PO TABS
80.0000 mg | ORAL_TABLET | Freq: Every day | ORAL | Status: DC
  Administered 2017-12-12 – 2017-12-15 (×5): 80 mg via ORAL
  Filled 2017-12-11 (×5): qty 1

## 2017-12-11 MED ORDER — ACETAMINOPHEN 325 MG PO TABS
650.0000 mg | ORAL_TABLET | Freq: Four times a day (QID) | ORAL | Status: DC | PRN
  Administered 2017-12-11 – 2017-12-13 (×2): 650 mg via ORAL
  Filled 2017-12-11 (×2): qty 2

## 2017-12-11 MED ORDER — ACETAMINOPHEN 650 MG RE SUPP: 650.0000 mg | Freq: Four times a day (QID) | RECTAL | Status: DC | PRN

## 2017-12-11 MED ORDER — ASPIRIN 81 MG PO CHEW
81.0000 mg | CHEWABLE_TABLET | Freq: Every day | ORAL | Status: DC
  Administered 2017-12-12 – 2017-12-15 (×5): 81 mg via ORAL
  Filled 2017-12-11 (×5): qty 1

## 2017-12-11 MED ORDER — SODIUM CHLORIDE 0.9 % IV BOLUS
500.0000 mL | Freq: Once | INTRAVENOUS | Status: AC
  Administered 2017-12-12: 500 mL via INTRAVENOUS

## 2017-12-11 MED ORDER — SODIUM CHLORIDE 0.9 % IV SOLN
2.0000 g | INTRAVENOUS | Status: DC
  Administered 2017-12-11: 2 g via INTRAVENOUS
  Filled 2017-12-11: qty 20

## 2017-12-11 MED ORDER — METOPROLOL TARTRATE 12.5 MG HALF TABLET
12.5000 mg | ORAL_TABLET | Freq: Two times a day (BID) | ORAL | Status: DC
  Administered 2017-12-12 – 2017-12-16 (×9): 12.5 mg via ORAL
  Filled 2017-12-11 (×10): qty 1

## 2017-12-11 MED ORDER — INSULIN ASPART 100 UNIT/ML ~~LOC~~ SOLN
10.0000 [IU] | Freq: Once | SUBCUTANEOUS | Status: AC
  Administered 2017-12-11: 10 [IU] via SUBCUTANEOUS
  Filled 2017-12-11: qty 1

## 2017-12-11 MED ORDER — SODIUM CHLORIDE 0.9 % IV SOLN
500.0000 mg | INTRAVENOUS | Status: DC
  Administered 2017-12-11: 500 mg via INTRAVENOUS
  Filled 2017-12-11: qty 500

## 2017-12-11 MED ORDER — INSULIN NPH (HUMAN) (ISOPHANE) 100 UNIT/ML ~~LOC~~ SUSP: 7.0000 [IU] | Freq: Two times a day (BID) | SUBCUTANEOUS | Status: DC

## 2017-12-11 MED ORDER — HYDRALAZINE HCL 20 MG/ML IJ SOLN: 5.0000 mg | INTRAMUSCULAR | Status: DC | PRN

## 2017-12-11 MED ORDER — INSULIN ASPART 100 UNIT/ML ~~LOC~~ SOLN
0.0000 [IU] | Freq: Three times a day (TID) | SUBCUTANEOUS | Status: DC
  Administered 2017-12-12: 7 [IU] via SUBCUTANEOUS
  Administered 2017-12-12: 5 [IU] via SUBCUTANEOUS
  Administered 2017-12-12: 3 [IU] via SUBCUTANEOUS
  Administered 2017-12-13: 5 [IU] via SUBCUTANEOUS
  Administered 2017-12-13: 7 [IU] via SUBCUTANEOUS
  Administered 2017-12-13: 3 [IU] via SUBCUTANEOUS
  Administered 2017-12-14 (×2): 5 [IU] via SUBCUTANEOUS
  Administered 2017-12-14: 3 [IU] via SUBCUTANEOUS
  Administered 2017-12-15: 7 [IU] via SUBCUTANEOUS
  Administered 2017-12-15: 5 [IU] via SUBCUTANEOUS
  Administered 2017-12-16: 3 [IU] via SUBCUTANEOUS
  Administered 2017-12-16: 5 [IU] via SUBCUTANEOUS

## 2017-12-11 MED ORDER — ONDANSETRON HCL 4 MG PO TABS: 4.0000 mg | ORAL_TABLET | Freq: Four times a day (QID) | ORAL | Status: DC | PRN

## 2017-12-11 NOTE — ED Triage Notes (Signed)
Per ems pt neighbor went to house to check on her. When pt opened door she fell. Pt did not hit head. Pt just finished doxycycline for chronic UTI. Hx of MI and stroke. Baseline slightly slurred speech.

## 2017-12-11 NOTE — ED Notes (Signed)
Pt attached to [place], aware of need for urine sample

## 2017-12-11 NOTE — Significant Event (Addendum)
Rapid Response Event Note  While rounding, I was asked to see patient for confusion/word finding trouble/twitching. Patient was receiving a bath when I arrived, her temperature is 103 orally, I asked that they get a rectal temp. Patient is admitted for UTI. Speech was slightly slurred, patient did not seem like she having trouble with word finding but is mildly confused, could be secondary to UTI ? Fever ?. I asked the NT get a blood sugar. I asked the RN to treat the fever as well. Patient was able to follow simple commands, moves all extremities, no drift in extremites, RUE (mild contracture around elbow) denies sensory loss, no facial asymmetry noted. CT Head was done, chronic left frontal/parietal infarct. + chills, skin warm to touch. Lung were clear in the uppers and diminished in the lowers but overall good air movement, not in acute distress. HR 110-120s (has been tachycardiac), history of AF. SBP stable, 97% on RA, RR 20-22.  Plan of Care: - Treat fever and reassess VS in an hour - Monitor neuro assessments.  - Delirium precautions given age and medical problem (UTI)  Start Time 2110 End Time 2140

## 2017-12-11 NOTE — ED Provider Notes (Signed)
[facility] EMERGENCY DEPARTMENT Provider Note   CSN: [id number] Arrival date & time: 12/11/17  1520     History   Chief Complaint No chief complaint on file.   HPI Kristina Summers is a 82 y.o. female.  Patient c/o fever, and fall today. Symptoms moderate, persistent. Felt generally weak, states stood up too fast to get door, and felt faint, denies loc. Denies head injury or headache. Denies neck or back pain. No chest pain or sob. No cough or uri symptoms. No abd pain or nvd. No dysuria, although hx frequent utis and feels may have uti.   The history is provided by the patient, the EMS personnel and a relative.    Past Medical History:  Diagnosis Date  . Anemia   . CAD (coronary artery disease)    a. s/p CABG in 2000  . Carotid artery occlusion   . CKD (chronic kidney disease)   . Diastolic CHF ([place]) 95/1884  . Fibromyalgia   . GERD (gastroesophageal reflux disease)   . History of CVA (cerebrovascular accident) 05/24/2014  . History of GI bleed   . Hyperlipidemia   . Hypertension   . Obesity   . PAF (paroxysmal atrial fibrillation) (HCC)    a. on Eliquis  . Peripheral vascular disease ([place])   . 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 Type II diabetes mellitus ([place]) dx'd 1979    Patient Active Problem List   Diagnosis Date Noted  . Acute on chronic diastolic heart failure ([place]) 11/08/2017  . 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 CKD (chronic kidney disease)   . Severe aortic stenosis 10/11/2017  . Essential hypertension 06/10/2017  . Fracture of distal end of right fibula 05/29/2014  . History of CVA (cerebrovascular accident) 05/24/2014  . Peripheral vascular disease, unspecified ([place]) 01/11/2014  . CAD (coronary artery disease)   . Paroxysmal atrial fibrillation   . Diabetes mellitus type 2 with  peripheral artery disease ([place])   . Hyperlipidemia   . History of GI bleed 04/21/2009  . S/P CABG (coronary artery bypass graft) 11/20/1998    Past Surgical History:  Procedure Laterality Date  . ABDOMINAL AORTAGRAM N/A 04/01/2011   Procedure: ABDOMINAL AORTAGRAM;  Surgeon: Conrad Franktown, MD;  Location: [facility] CATH LAB;  Service: Cardiovascular;  Laterality: N/A;  . ANGIOPLASTY  06/17/11   Left leg common femoral artery cannulation under u/s Left leg runoff  . CARDIAC CATHETERIZATION  10/11/2017  . CARPAL TUNNEL RELEASE Right   . CATARACT EXTRACTION W/ INTRAOCULAR LENS  IMPLANT, BILATERAL  2004-2005  . CORONARY ARTERY BYPASS GRAFT  2000   CABG X5  . EYE SURGERY Left    "lasered before cataract OR"  . LOWER EXTREMITY ANGIOGRAM Bilateral 04/01/2011   Procedure: LOWER EXTREMITY ANGIOGRAM;  Surgeon: Conrad Marathon, MD;  Location: [facility] CATH LAB;  Service: Cardiovascular;  Laterality: Bilateral;  . LOWER EXTREMITY ANGIOGRAM Left 06/17/2011   Procedure: LOWER EXTREMITY ANGIOGRAM;  Surgeon: Conrad Easton, MD;  Location: [facility] CATH LAB;  Service: Cardiovascular;  Laterality: Left;  . LOWER EXTREMITY ANGIOGRAM N/A 11/18/2011   Procedure: LOWER EXTREMITY ANGIOGRAM;  Surgeon: Conrad Montecito, MD;  Location: [facility] CATH LAB;  Service: Cardiovascular;  Laterality: N/A;  . PERCUTANEOUS STENT INTERVENTION Right 04/01/2011   Procedure: PERCUTANEOUS STENT INTERVENTION;  Surgeon: Conrad Tacoma, MD;  Location: [facility] CATH LAB;  Service: Cardiovascular;  Laterality: Right;  rt iliac stent  . PERIPHERAL ARTERIAL STENT GRAFT  04/01/11   right common iliac  . RIGHT/LEFT HEART CATH AND CORONARY/GRAFT ANGIOGRAPHY N/A 10/11/2017   Procedure: RIGHT/LEFT HEART CATH AND CORONARY/GRAFT ANGIOGRAPHY;  Surgeon: Sherren Mocha, MD;  Location: [facility] CV LAB;  Service: Cardiovascular;  Laterality: N/A;  . TEE WITHOUT CARDIOVERSION N/A 11/08/2017   Procedure: TRANSESOPHAGEAL ECHOCARDIOGRAM (TEE);  Surgeon: Sherren Mocha, MD;  Location: [place];   Service: Open Heart Surgery;  Laterality: N/A;  . TRANSCATHETER AORTIC VALVE REPLACEMENT, TRANSFEMORAL N/A 11/08/2017   Procedure: TRANSCATHETER AORTIC VALVE REPLACEMENT, TRANSFEMORAL;  Surgeon: Sherren Mocha, MD;  Location: [place];  Service: Open Heart Surgery;  Laterality: N/A;  . TRIGGER FINGER RELEASE Left 1996   thumb     OB History   None      Home Medications    Prior to Admission medications   Medication Sig Start Date End Date Taking? Authorizing Provider  acetaminophen (TYLENOL) 500 MG tablet Take 500 mg by mouth daily as needed for moderate pain or headache.    [provider]  amLODipine (NORVASC) 10 MG tablet Take 1 tablet (10 mg total) by mouth daily. 11/10/17   Eileen Stanford, PA-C  apixaban (ELIQUIS) 5 MG TABS tablet Take 1 tablet (5 mg total) by mouth 2 (two) times daily. 06/03/14   Rai, Vernelle Emerald, MD  aspirin 81 MG chewable tablet Chew 1 tablet (81 mg total) by mouth daily. 11/10/17   Eileen Stanford, PA-C  atorvastatin (LIPITOR) 80 MG tablet Take 1 tablet (80 mg total) by mouth daily. Patient taking differently: Take 80 mg by mouth at bedtime.  11/27/12   Luana Shu, Freeman Caldron, PA-C  furosemide (LASIX) 40 MG tablet Take 40 mg by mouth 2 (two) times daily. 06/30/17   [provider]  glucose 4 GM chewable tablet Chew 4 tablets by mouth once as needed for low blood sugar.     [provider]  insulin NPH Human (HUMULIN N,NOVOLIN N) 100 UNIT/ML injection Inject 0.15 mLs (15 Units total) into the skin 2 (two) times daily. 15 units in the morning and 10 units in the PM Patient taking differently: Inject 10-15 Units into the skin See admin instructions. 15 units in the morning and 10 units in the PM 10/12/17   Eileen Stanford, PA-C  insulin regular (NOVOLIN R,HUMULIN R) 100 units/mL injection Inject 5 Units into the skin 2 (two) times daily.    [provider]  losartan (COZAAR) 50 MG tablet Take 1 tablet (50 mg total) by mouth daily.  11/22/17   Eileen Stanford, PA-C  metoprolol tartrate (LOPRESSOR) 25 MG tablet Take 0.5 tablets (12.5 mg total) by mouth 2 (two) times daily. 11/10/17   Eileen Stanford, PA-C  Multiple Vitamins-Minerals (ONE-A-DAY 50 PLUS PO) Take 1 tablet by mouth daily.    [provider]  NITROSTAT 0.4 MG SL tablet Place 1 tablet under the tongue every 5 (five) minutes x 3 doses as needed for chest pain. Chest pain 03/07/14   [provider]    Family History Family History  Problem Relation Age of Onset  . Other Brother        intestinal blockage  . Diabetes Brother     Social History Social History   Tobacco Use  . Smoking status: Former Smoker    Packs/day: 2.00    Years: 30.00    Pack years: 60.00    Types: Cigarettes  Last attempt to quit: 04/19/1990    Years since quitting: 27.6  . Smokeless tobacco: Never Used  . Tobacco comment: stopped smoking cigarettes 1991  Substance Use Topics  . Alcohol use: Not Currently    Alcohol/week: 0.0 standard drinks    Comment: "tried different alcohols when I was 1st married; never drank much at  ALL"  . Drug use: Never     Allergies   Morphine and related; Lisinopril; and Zoloft [sertraline hcl]   Review of Systems Review of Systems  Constitutional: Positive for fever.  HENT: Negative for sore throat.   Eyes: Negative for visual disturbance.  Respiratory: Negative for cough and shortness of breath.   Cardiovascular: Negative for chest pain.  Gastrointestinal: Negative for abdominal pain, diarrhea and vomiting.  Endocrine: Negative for polyuria.  Genitourinary: Negative for flank pain.  Musculoskeletal: Negative for back pain, neck pain and neck stiffness.  Skin: Negative for rash.  Neurological: Negative for headaches.  Hematological: Does not bruise/bleed easily.  Psychiatric/Behavioral: Negative for confusion.     Physical Exam Updated Vital Signs There were no vitals taken for this visit.  Physical Exam   Constitutional: She appears well-developed and well-nourished.  HENT:  Mouth/Throat: Oropharynx is clear and moist.  Eyes: Pupils are equal, round, and reactive to light. Conjunctivae are normal. No scleral icterus.  Neck: Normal range of motion. Neck supple. No tracheal deviation present.  No bruits.   Cardiovascular: Normal rate, regular rhythm, normal heart sounds and intact distal pulses. Exam reveals no gallop and no friction rub.  No murmur heard. Pulmonary/Chest: Effort normal and breath sounds normal. No respiratory distress.  Abdominal: Soft. Normal appearance and bowel sounds are normal. She exhibits no distension. There is no tenderness.  Genitourinary:  Genitourinary Comments: No cva tenderness  Musculoskeletal: She exhibits no edema.  Neurological: She is alert.  Speech clear. Motor/sens grossly intact bil, stre 5/5.   Skin: Skin is warm and dry. No rash noted.  Psychiatric: She has a normal mood and affect.  Nursing note and vitals reviewed.    ED Treatments / Results  Labs (all labs ordered are listed, but only abnormal results are displayed) Results for orders placed or performed during the hospital encounter of 12/11/17  Comprehensive metabolic panel  Result Value Ref Range   Sodium 131 (L) 135 - 145 mmol/L   Potassium 4.4 3.5 - 5.1 mmol/L   Chloride 97 (L) 98 - 111 mmol/L   CO2 25 22 - 32 mmol/L   Glucose, Bld 402 (H) 70 - 99 mg/dL   BUN 23 8 - 23 mg/dL   Creatinine, Ser 1.18 (H) 0.44 - 1.00 mg/dL   Calcium 9.1 8.9 - 10.3 mg/dL   Total Protein 7.8 6.5 - 8.1 g/dL   Albumin 2.9 (L) 3.5 - 5.0 g/dL   AST 37 15 - 41 U/L   ALT 20 0 - 44 U/L   Alkaline Phosphatase 120 38 - 126 U/L   Total Bilirubin 1.0 0.3 - 1.2 mg/dL   GFR calc non Af Amer 42 (L) >60 mL/min   GFR calc Af Amer 49 (L) >60 mL/min   Anion gap 9 5 - 15  CBC WITH DIFFERENTIAL  Result Value Ref Range   WBC 11.0 (H) 4.0 - 10.5 K/uL   RBC 3.82 (L) 3.87 - 5.11 MIL/uL   Hemoglobin 10.8 (L) 12.0 -  15.0 g/dL   HCT 35.0 (L) 36.0 - 46.0 %   MCV 91.6 78.0 - 100.0 fL   MCH  28.3 26.0 - 34.0 pg   MCHC 30.9 30.0 - 36.0 g/dL   RDW 14.8 11.5 - 15.5 %   Platelets 183 150 - 400 K/uL   Neutrophils Relative % 91 %   Neutro Abs 10.0 (H) 1.7 - 7.7 K/uL   Lymphocytes Relative 3 %   Lymphs Abs 0.4 (L) 0.7 - 4.0 K/uL   Monocytes Relative 5 %   Monocytes Absolute 0.5 0.1 - 1.0 K/uL   Eosinophils Relative 0 %   Eosinophils Absolute 0.0 0.0 - 0.7 K/uL   Basophils Relative 1 %   Basophils Absolute 0.1 0.0 - 0.1 K/uL   Immature Granulocytes 0 %   Abs Immature Granulocytes 0.0 0.0 - 0.1 K/uL  Urinalysis, Routine w reflex microscopic  Result Value Ref Range   Color, Urine YELLOW YELLOW   APPearance CLOUDY (A) CLEAR   Specific Gravity, Urine 1.015 1.005 - 1.030   pH 5.0 5.0 - 8.0   Glucose, UA >=500 (A) NEGATIVE mg/dL   Hgb urine dipstick MODERATE (A) NEGATIVE   Bilirubin Urine NEGATIVE NEGATIVE   Ketones, ur NEGATIVE NEGATIVE mg/dL   Protein, ur >=300 (A) NEGATIVE mg/dL   Nitrite NEGATIVE NEGATIVE   Leukocytes, UA LARGE (A) NEGATIVE   RBC / HPF 6-10 0 - 5 RBC/hpf   WBC, UA >50 (H) 0 - 5 WBC/hpf   Bacteria, UA MANY (A) NONE SEEN   Squamous Epithelial / LPF 0-5 0 - 5   WBC Clumps PRESENT    Hyaline Casts, UA PRESENT   I-Stat CG4 Lactic Acid, ED  (not at  [employer])  Result Value Ref Range   Lactic Acid, Venous 1.84 0.5 - 1.9 mmol/L   Ct Head Wo Contrast  Result Date: 12/11/2017 CLINICAL DATA:  Patient fell. EXAM: CT HEAD WITHOUT CONTRAST TECHNIQUE: Contiguous axial images were obtained from the base of the skull through the vertex without intravenous contrast. COMPARISON:  July 08, 2017 FINDINGS: Brain: Evaluation mildly marred due to motion. Within this limitation, no subdural, epidural, or subarachnoid hemorrhage. No mass effect or midline shift. The left frontal parietal infarct is again identified. No acute cortical ischemia infarct noted. Cerebellum and brainstem are normal. Basal cisterns  are normal. Vascular: Calcified atherosclerosis in the intracranial carotids. Skull: Normal. Negative for fracture or focal lesion. Sinuses/Orbits: No acute finding. Other: None. IMPRESSION: 1. No acute intracranial abnormalities noted. 2. Chronic left frontal parietal infarct. Electronically Signed   By: Dorise Bullion III M.D   On: 12/11/2017 18:32   Dg Chest Port 1 View  Result Date: 12/11/2017 CLINICAL DATA:  Fever. EXAM: PORTABLE CHEST 1 VIEW COMPARISON:  November 08, 2016 FINDINGS: Cardiomegaly. The hila and mediastinum are normal. Haziness/increased interstitial markings in the right apex and diffusely on the left. No focal infiltrate. No other acute abnormalities. IMPRESSION: Increased interstitial markings diffusely on the left and confined to the right apex. The findings could represent asymmetric edema or an infectious process. The findings are unchanged since November 08, 2017. Electronically Signed   By: Dorise Bullion III M.D   On: 12/11/2017 16:04    EKG EKG Interpretation  Date/Time:  Sunday December 11 2017 15:58:19 EDT Ventricular Rate:  119 PR Interval:    QRS Duration: 126 QT Interval:  323 QTC Calculation: 455 R Axis:   138 Text Interpretation:  Atrial fibrillation Left bundle branch block Confirmed by Lajean Saver [id number]) on 12/11/2017 4:14:52 PM   Radiology Ct Head Wo Contrast  Result Date: 12/11/2017 CLINICAL DATA:  Patient fell. EXAM:  CT HEAD WITHOUT CONTRAST TECHNIQUE: Contiguous axial images were obtained from the base of the skull through the vertex without intravenous contrast. COMPARISON:  July 08, 2017 FINDINGS: Brain: Evaluation mildly marred due to motion. Within this limitation, no subdural, epidural, or subarachnoid hemorrhage. No mass effect or midline shift. The left frontal parietal infarct is again identified. No acute cortical ischemia infarct noted. Cerebellum and brainstem are normal. Basal cisterns are normal. Vascular: Calcified atherosclerosis in the  intracranial carotids. Skull: Normal. Negative for fracture or focal lesion. Sinuses/Orbits: No acute finding. Other: None. IMPRESSION: 1. No acute intracranial abnormalities noted. 2. Chronic left frontal parietal infarct. Electronically Signed   By: Dorise Bullion III M.D   On: 12/11/2017 18:32   Dg Chest Port 1 View  Result Date: 12/11/2017 CLINICAL DATA:  Fever. EXAM: PORTABLE CHEST 1 VIEW COMPARISON:  November 08, 2016 FINDINGS: Cardiomegaly. The hila and mediastinum are normal. Haziness/increased interstitial markings in the right apex and diffusely on the left. No focal infiltrate. No other acute abnormalities. IMPRESSION: Increased interstitial markings diffusely on the left and confined to the right apex. The findings could represent asymmetric edema or an infectious process. The findings are unchanged since November 08, 2017. Electronically Signed   By: Dorise Bullion III M.D   On: 12/11/2017 16:04    Procedures Procedures (including critical care time)  Medications Ordered in ED Medications - No data to display   Initial Impression / Assessment and Plan / ED Course  I have reviewed the triage vital signs and the nursing notes.  Pertinent labs & imaging results that were available during my care of the patient were reviewed by me and considered in my medical decision making (see chart for details).  Iv ns. Labs. Cultures. Cxr. ua an urine culture.   Reviewed nursing notes and prior charts for additional history.   Labs reviewed - glucose v high. novolog sq, iv ns boluses.   cxr reviewed - increased IS markings.   CT reviewed - no acute process noted.   Post cultures sent, Iv abx given.  ua returns positive for uti, cx pending.  Medical service consulted for admission.    Final Clinical Impressions(s) / ED Diagnoses   Final diagnoses:  None    ED Discharge Orders    None       Lajean Saver, MD 12/11/17 [phone]

## 2017-12-11 NOTE — H&P (Signed)
History and Physical    Kristina Summers RDE:[id number] DOB: 25-Aug-1935 DOA: 12/11/2017  Referring MD/NP/PA:   PCP: Care, [place]   Patient coming from:  The patient is coming from home.  At baseline, pt is independent for most of ADL.  Chief Complaint: fever, burning on urination, fall, confusion,   HPI: Kristina Summers is a 82 y.o. female with medical history significant of hypertension, hyperlipidemia, diabetes mellitus, stroke, GERD, aortic stenosis, s/p of AVRT 11/08/2017, CAD, CABG, dCHF, GI bleeding, A fib on Eliquis, CKD 3, who presents with fever, fall, burning on urination and confusion.  Per her daughter, pt has not been feeling well today. She is mildly confused. Normally she is living alone. She is independent for most of ADL. When I saw her in ED, She is mildly confused, oriented to place and person, but not to time. She has fever and chill. She states that she has burning on urination and increased urinary frequency, but denies dysuria. Per report, neighbor went to house to check on her. When pt opened door she fell. Pt did not hit head. No LOC.  Patient denies unilateral weakness, numbness or tingling his extremities.  No facial droop. She has baseline mild slurred speech.  She states that she stood up too fast to get door, and felt faint and fell.  Patient does not have chest pain, shortness of breath, cough.  No nausea, vomiting, diarrhea or abdominal pain.  No vision loss or hearing loss. Per her daughter, pt has a small right foot plantar ulcer which has been going on since March. Pt has [place] RN coming to take care her and change dress twice a week.  ED Course: pt was found to have positive urinalysis (large amount of leukocyte, many bacteria, WBC>50, cloudy appearance), WBC 11.0, lactic acid of 1.84, stable renal function, temperature 100.3, tachycardia, tachypnea, oxygen saturation 96% on 2 L nasal cannula oxygen.  CT head is negative for acute intracranial abnormalities, but showed  chronic left frontal lobe infarction.  Chest x-ray showed increased interstitial markings diffusely on the left and confined to the right apex. Pt is admitted to telemetry bed as inpatient.  Review of Systems:   General: has fevers, chills, no body weight gain, has poor appetite, has fatigue HEENT: no blurry vision, hearing changes or sore throat Respiratory: no dyspnea, coughing, wheezing CV: no chest pain, no palpitations GI: no nausea, vomiting, abdominal pain, diarrhea, constipation GU: no dysuria, has burning on urination, increased urinary frequency, no hematuria  Ext: no leg edema Neuro: no unilateral weakness, numbness, or tingling, no vision change or hearing loss. Had fall. Skin: no rash. Has right foot ulcer. Has small bruises in back.  MSK: No muscle spasm, no deformity, no limitation of range of movement in spin Heme: No easy bruising.  Travel history: No recent long distant travel.  Allergy:  Allergies  Allergen Reactions  . Morphine And Related Nausea And Vomiting    Chest pain    . Lisinopril Cough  . Zoloft [Sertraline Hcl] Nausea Only         Past Medical History:  Diagnosis Date  . Anemia   . CAD (coronary artery disease)    a. s/p CABG in 2000  . Carotid artery occlusion   . CKD (chronic kidney disease)   . Diastolic CHF ([place]) 63/1497  . Fibromyalgia   . GERD (gastroesophageal reflux disease)   . History of CVA (cerebrovascular accident) 05/24/2014  . History of GI bleed   . Hyperlipidemia   .  Hypertension   . Obesity   . PAF (paroxysmal atrial fibrillation) (HCC)    a. on Eliquis  . Peripheral vascular disease ([place])   . 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 Type II diabetes mellitus ([place]) dx'd 1979    Past Surgical History:  Procedure Laterality Date  . ABDOMINAL AORTAGRAM N/A 04/01/2011   Procedure: ABDOMINAL  AORTAGRAM;  Surgeon: Conrad Jaconita, MD;  Location: [facility] CATH LAB;  Service: Cardiovascular;  Laterality: N/A;  . ANGIOPLASTY  06/17/11   Left leg common femoral artery cannulation under u/s Left leg runoff  . CARDIAC CATHETERIZATION  10/11/2017  . CARPAL TUNNEL RELEASE Right   . CATARACT EXTRACTION W/ INTRAOCULAR LENS  IMPLANT, BILATERAL  2004-2005  . CORONARY ARTERY BYPASS GRAFT  2000   CABG X5  . EYE SURGERY Left    "lasered before cataract OR"  . LOWER EXTREMITY ANGIOGRAM Bilateral 04/01/2011   Procedure: LOWER EXTREMITY ANGIOGRAM;  Surgeon: Conrad [place], MD;  Location: [facility] CATH LAB;  Service: Cardiovascular;  Laterality: Bilateral;  . LOWER EXTREMITY ANGIOGRAM Left 06/17/2011   Procedure: LOWER EXTREMITY ANGIOGRAM;  Surgeon: Conrad [place], MD;  Location: [facility] CATH LAB;  Service: Cardiovascular;  Laterality: Left;  . LOWER EXTREMITY ANGIOGRAM N/A 11/18/2011   Procedure: LOWER EXTREMITY ANGIOGRAM;  Surgeon: Conrad Seagrove, MD;  Location: [facility] C[facility] LAB;  Service: Cardiovascular;  Laterality: N/A;  . PERCUTANEOUS STENT INTERVENTION Right 04/01/2011   Procedure: PERCUTANEOUS STENT INTERVENTION;  Surgeon: Conrad [place], MD;  Location: [facility] CATH LAB;  Service: Cardiovascular;  Laterality: Right;  rt iliac stent  . PERIPHERAL ARTERIAL STENT GRAFT  04/01/11   right common iliac  . RIGHT/LEFT HEART CATH AND CORONARY/GRAFT ANGIOGRAPHY N/A 10/11/2017   Procedure: RIGHT/LEFT HEART CATH AND CORONARY/GRAFT ANGIOGRAPHY;  Surgeon: Sherren Mocha, MD;  Location: [place] CV LAB;  Service: Cardiovascular;  Laterality: N/A;  . TEE WITHOUT CARDIOVERSION N/A 11/08/2017   Procedure: TRANSESOPHAGEAL ECHOCARDIOGRAM (TEE);  Surgeon: Sherren Mocha, MD;  Location: [place];  Service: Open Heart Surgery;  Laterality: N/A;  . TRANSCATHETER AORTIC VALVE REPLACEMENT, TRANSFEMORAL N/A 11/08/2017   Procedure: TRANSCATHETER AORTIC VALVE REPLACEMENT, TRANSFEMORAL;  Surgeon: Sherren Mocha, MD;  Location: [place];  Service: Open Heart  Surgery;  Laterality: N/A;  . TRIGGER FINGER RELEASE Left 1996   thumb    Social History:  reports that she quit smoking about 27 years ago. Her smoking use included cigarettes. She has a 60.00 pack-year smoking history. She has never used smokeless tobacco. She reports that she drank alcohol. She reports that she does not use drugs.  Family History:  Family History  Problem Relation Age of Onset  . Other Brother        intestinal blockage  . Diabetes Brother      Prior to Admission medications   Medication Sig Start Date End Date Taking? Authorizing Provider  acetaminophen (TYLENOL) 500 MG tablet Take 500 mg by mouth daily as needed for moderate pain or headache.   Yes [provider]  amLODipine (NORVASC) 10 MG tablet Take 1 tablet (10 mg total) by mouth daily. Patient taking differently: Take 10 mg by mouth at bedtime.  11/10/17  Yes Eileen Stanford, PA-C  apixaban (ELIQUIS) 5 MG TABS tablet Take 1 tablet (5 mg total) by mouth 2 (two) times daily. 06/03/14  Yes Rai, Ripudeep Raliegh Ip, MD  aspirin 81 MG chewable  tablet Chew 1 tablet (81 mg total) by mouth daily. Patient taking differently: Chew 81 mg by mouth at bedtime.  11/10/17  Yes Eileen Stanford, PA-C  atorvastatin (LIPITOR) 80 MG tablet Take 1 tablet (80 mg total) by mouth daily. Patient taking differently: Take 80 mg by mouth at bedtime.  11/27/12  Yes Baker, Zachary H, PA-C  furosemide (LASIX) 40 MG tablet Take 40 mg by mouth See admin instructions. Take one tablet (40 mg) by mouth twice daily - morning and afternoon 06/30/17  Yes [provider]  glucose 4 GM chewable tablet Chew 1-4 tablets by mouth once as needed for low blood sugar.    Yes [provider]  insulin NPH Human (HUMULIN N,NOVOLIN N) 100 UNIT/ML injection Inject 0.15 mLs (15 Units total) into the skin 2 (two) times daily. 15 units in the morning and 10 units in the PM Patient taking differently: Inject 10-15 Units into the skin See admin  instructions. 15 units in the morning and 10 units in the PM 10/12/17  Yes Eileen Stanford, PA-C  insulin regular (NOVOLIN R,HUMULIN R) 100 units/mL injection Inject 5 Units into the skin 2 (two) times daily.   Yes [provider]  losartan (COZAAR) 50 MG tablet Take 1 tablet (50 mg total) by mouth daily. 11/22/17  Yes Eileen Stanford, PA-C  metoprolol tartrate (LOPRESSOR) 25 MG tablet Take 0.5 tablets (12.5 mg total) by mouth 2 (two) times daily. 11/10/17  Yes Eileen Stanford, PA-C  Multiple Vitamin (MULTIVITAMIN WITH MINERALS) TABS tablet Take 1 tablet by mouth daily. One a Day 50 plus   Yes [provider]  nitroGLYCERIN (NITROSTAT) 0.4 MG SL tablet Place 0.4 mg under the tongue every 5 (five) minutes as needed for chest pain.   Yes [provider]  ciprofloxacin (CIPRO) 250 MG tablet Take 500 mg by mouth 3 (three) times daily.    [provider]  doxycycline (VIBRA-TABS) 100 MG tablet Take 100 mg by mouth 2 (two) times daily.    [provider]    Physical Exam: Vitals:   12/11/17 1830 12/11/17 1845 12/11/17 1900 12/11/17 1915  BP: (!) 162/70 (!) 148/94 127/75 (!) 147/61  Pulse: (!) 109 (!) 107 (!) 110 (!) 113  Resp: 19 (!) 21 (!) 22 16  Temp:      TempSrc:      SpO2: 98% 98% 98% 98%  Weight:      Height:       General: Not in acute distress HEENT:       Eyes: PERRL, EOMI, no scleral icterus.       ENT: No discharge from the ears and nose, no pharynx injection, no tonsillar enlargement.        Neck: No JVD, no bruit, no mass felt. Heme: No neck lymph node enlargement. Cardiac: S1/S2, RRR, No murmurs, No gallops or rubs. Respiratory: No rales, wheezing, rhonchi or rubs. GI: Soft, nondistended, nontender, no rebound pain, no organomegaly, BS present. GU: No hematuria Ext: No pitting leg edema bilaterally. 2+DP/PT pulse bilaterally. Musculoskeletal: No joint deformities, No joint redness or warmth, no limitation of ROM in  spin. Skin: No rashes. Has small bruises in back. Has small right foot plantar ulcer, with little clear fluid draining on compression, no surrounding erythema. Neuro: Mildly confused, oriented to place and person, but not to time X3, cranial nerves II-XII grossly intact, moves all extremities normally. Psych: Patient is not psychotic, no suicidal or hemocidal ideation.  Labs on  Admission: I have personally reviewed following labs and imaging studies  CBC: Recent Labs  Lab 12/11/17 1550  WBC 11.0*  NEUTROABS 10.0*  HGB 10.8*  HCT 35.0*  MCV 91.6  PLT 401   Basic Metabolic Panel: Recent Labs  Lab 12/11/17 1550  NA 131*  K 4.4  CL 97*  CO2 25  GLUCOSE 402*  BUN 23  CREATININE 1.18*  CALCIUM 9.1   GFR: Estimated Creatinine Clearance: 41.5 mL/min (A) (by C-G formula based on SCr of 1.18 mg/dL (H)). Liver Function Tests: Recent Labs  Lab 12/11/17 1550  AST 37  ALT 20  ALKPHOS 120  BILITOT 1.0  PROT 7.8  ALBUMIN 2.9*   No results for input(s): LIPASE, AMYLASE in the last 168 hours. No results for input(s): AMMONIA in the last 168 hours. Coagulation Profile: No results for input(s): INR, PROTIME in the last 168 hours. Cardiac Enzymes: No results for input(s): CKTOTAL, CKMB, CKMBINDEX, TROPONINI in the last 168 hours. BNP (last 3 results) No results for input(s): PROBNP in the last 8760 hours. HbA1C: No results for input(s): HGBA1C in the last 72 hours. CBG: No results for input(s): GLUCAP in the last 168 hours. Lipid Profile: No results for input(s): CHOL, HDL, LDLCALC, TRIG, CHOLHDL, LDLDIRECT in the last 72 hours. Thyroid Function Tests: No results for input(s): TSH, T4TOTAL, FREET4, T3FREE, THYROIDAB in the last 72 hours. Anemia Panel: No results for input(s): VITAMINB12, FOLATE, FERRITIN, TIBC, IRON, RETICCTPCT in the last 72 hours. Urine analysis:    Component Value Date/Time   COLORURINE YELLOW 12/11/2017 1741   APPEARANCEUR CLOUDY (A) 12/11/2017 1741    LABSPEC 1.015 12/11/2017 1741   PHURINE 5.0 12/11/2017 1741   GLUCOSEU >=500 (A) 12/11/2017 1741   HGBUR MODERATE (A) 12/11/2017 1741   BILIRUBINUR NEGATIVE 12/11/2017 1741   KETONESUR NEGATIVE 12/11/2017 1741   PROTEINUR >=300 (A) 12/11/2017 1741   UROBILINOGEN 1.0 05/29/2014 1052   NITRITE NEGATIVE 12/11/2017 1741   LEUKOCYTESUR LARGE (A) 12/11/2017 1741   Sepsis Labs: @LABRCNTIP (procalcitonin:4,lacticidven:4) )No results found for this or any previous visit (from the past 240 hour(s)).   Radiological Exams on Admission: Ct Head Wo Contrast  Result Date: 12/11/2017 CLINICAL DATA:  Patient fell. EXAM: CT HEAD WITHOUT CONTRAST TECHNIQUE: Contiguous axial images were obtained from the base of the skull through the vertex without intravenous contrast. COMPARISON:  July 08, 2017 FINDINGS: Brain: Evaluation mildly marred due to motion. Within this limitation, no subdural, epidural, or subarachnoid hemorrhage. No mass effect or midline shift. The left frontal parietal infarct is again identified. No acute cortical ischemia infarct noted. Cerebellum and brainstem are normal. Basal cisterns are normal. Vascular: Calcified atherosclerosis in the intracranial carotids. Skull: Normal. Negative for fracture or focal lesion. Sinuses/Orbits: No acute finding. Other: None. IMPRESSION: 1. No acute intracranial abnormalities noted. 2. Chronic left frontal parietal infarct. Electronically Signed   By: Dorise Bullion III M.D   On: 12/11/2017 18:32   Dg Chest Port 1 View  Result Date: 12/11/2017 CLINICAL DATA:  Fever. EXAM: PORTABLE CHEST 1 VIEW COMPARISON:  November 08, 2016 FINDINGS: Cardiomegaly. The hila and mediastinum are normal. Haziness/increased interstitial markings in the right apex and diffusely on the left. No focal infiltrate. No other acute abnormalities. IMPRESSION: Increased interstitial markings diffusely on the left and confined to the right apex. The findings could represent asymmetric edema  or an infectious process. The findings are unchanged since November 08, 2017. Electronically Signed   By: Dorise Bullion III M.D   On:  12/11/2017 16:04     EKG: Independently reviewed.  Atrial fibrillation, QTc 455, poor R wave progression, low voltage.   Assessment/Plan Principal Problem:   UTI (urinary tract infection) Active Problems:   CAD (coronary artery disease)   Hyperlipidemia   S/P CABG (coronary artery bypass graft)   Fall   Sepsis ([place])   Essential hypertension   Type II diabetes mellitus with renal manifestations ([place])   S/P TAVR (transcatheter aortic valve replacement)   History of CVA (cerebrovascular accident)   CKD (chronic kidney disease), stage III (HCC)   Acute metabolic encephalopathy   Foot ulcer, right ([place])  Sepsis due to UTI: Patient meets criteria for sepsis with fever, tachycardia, tachypnea and leukocytosis.  Most likely due to UTI.  Currently hemodynamically stable.  Lactic acid is normal.  Chest x-ray showed some interstitial change, but patient does not have any respiratory symptoms, clinically does not seem to have pneumonia.   - Admit to telemetry bed as inpt -  Ceftriaxone by IV (pt was also give one dose of azithromycin in the ED, which will be discontinued) - Follow up results of urine and blood cx and amend antibiotic regimen if needed per sensitivity results - prn Zofran for nausea - will get Procalcitonin and trend lactic acid levels per sepsis protocol. - IVF: 1L of NS bolus in ED - f/u Bx and Ux  Fall: Looks like due to UTI and sepsis.  No focal neurologic findings on physical examination.  No LOC. CT head is negative for acute intracranial abnormalities. - Treat underlying issues - PT/OT  CAD: s/p of CABG. No CP -continue Lipitor, metoprolol -When necessary nitroglycerin  Chronic dCHF: 2-D echo on 11/09/17 showed EF of 50-55 percent. Patient does not have leg edema JVD. No shortness of breath or chest pain. CHF is compensated. -Hold  her Lasix due to sepsis -check BNP  Atrial Fibrillation: CHA2DS2-VASc Score is 9, needs oral anticoagulation. Patient is on Eliquis at home.  Heart rate is 110s. -continue Eliquis and metoprolol   HLD: -Lipitor  Hx of stroke: -on Eliquis for A fib -Lipitor  HTN:  -hold cozarr and amlodipine since patient is at risk of developing hypotension secondary to sepsis -Continue metoprolol  -IV hydralazine when necessary  CKD-III: stable.  Baseline creatinine 1.1-1.3.  Her creatinine is 1.18, BUN 23 - Follow up renal function by BMP  Type II diabetes mellitus with renal manifestations ([facility]): Last A1c 6.0 on 11/04/17, well controled. Patient is taking Humulin and NPH Humulin at home. initally blood sugar is 402. Pt was given 10 U of Novolog in ED -will decrease NPH Humulin dose from 15-10 U to 10-7 U bid -SSI  History of CVA (cerebrovascular accident) -On Eliquis and lipitor  S/P TAVR (transcatheter aortic valve replacement): -no acute issue  Foot ulcer, right [facility]):  -wound care consult.   Inpatient status:  # Patient requires inpatient status due to high intensity of service, high risk for further deterioration and high frequency of surveillance required.  I certify that at the point of admission it is my clinical judgment that the patient will require inpatient hospital care spanning beyond 2 midnights from the point of admission.  . This patient has multiple chronic comorbidities including hypertension, hyperlipidemia, diabetes mellitus, stroke, GERD, aortic stenosis, s/p of AVRT 11/08/2017, CAD, CABG, dCHF, GI bleeding, A fib on Eliquis, CKD 3. . Now patient has presenting symptoms include fever, chills, fall, burning on urination and confusion. . The worrisome physical exam findings include altered  mental status . The initial radiographic and laboratory data are worrisome because of positive urinalysis, leukocytosis, meeting criteria for sepsis . Current medical needs:  please see my assessment and plan          DVT ppx: on Eliquis Code Status: DNR (per her daughter who is her POA, patient wants to be DNR) Family Communication:  Yes, patient's daughter at bed side Disposition Plan:  Anticipate discharge back to previous home environment Consults called:  none Admission status:  Inpatient/tele     Date of Service 12/11/2017    Ivor Costa [facility] Pager [phone]  If 7PM-7AM, please contact night-coverage [email or web address] Password [facility] 12/11/2017, 8:08 PM

## 2017-12-12 ENCOUNTER — Inpatient Hospital Stay (HOSPITAL_COMMUNITY): Payer: Medicare Other

## 2017-12-12 ENCOUNTER — Telehealth (HOSPITAL_COMMUNITY): Payer: Self-pay

## 2017-12-12 DIAGNOSIS — N183 Chronic kidney disease, stage 3 (moderate): Secondary | ICD-10-CM

## 2017-12-12 DIAGNOSIS — L97519 Non-pressure chronic ulcer of other part of right foot with unspecified severity: Secondary | ICD-10-CM

## 2017-12-12 DIAGNOSIS — R7881 Bacteremia: Secondary | ICD-10-CM

## 2017-12-12 DIAGNOSIS — E1151 Type 2 diabetes mellitus with diabetic peripheral angiopathy without gangrene: Secondary | ICD-10-CM

## 2017-12-12 DIAGNOSIS — Z8673 Personal history of transient ischemic attack (TIA), and cerebral infarction without residual deficits: Secondary | ICD-10-CM

## 2017-12-12 DIAGNOSIS — Z87891 Personal history of nicotine dependence: Secondary | ICD-10-CM

## 2017-12-12 DIAGNOSIS — Z951 Presence of aortocoronary bypass graft: Secondary | ICD-10-CM

## 2017-12-12 DIAGNOSIS — Z833 Family history of diabetes mellitus: Secondary | ICD-10-CM

## 2017-12-12 DIAGNOSIS — R4701 Aphasia: Secondary | ICD-10-CM

## 2017-12-12 DIAGNOSIS — Z952 Presence of prosthetic heart valve: Secondary | ICD-10-CM

## 2017-12-12 DIAGNOSIS — E1122 Type 2 diabetes mellitus with diabetic chronic kidney disease: Secondary | ICD-10-CM

## 2017-12-12 DIAGNOSIS — Z885 Allergy status to narcotic agent status: Secondary | ICD-10-CM

## 2017-12-12 DIAGNOSIS — I130 Hypertensive heart and chronic kidney disease with heart failure and stage 1 through stage 4 chronic kidney disease, or unspecified chronic kidney disease: Secondary | ICD-10-CM

## 2017-12-12 DIAGNOSIS — B9562 Methicillin resistant Staphylococcus aureus infection as the cause of diseases classified elsewhere: Secondary | ICD-10-CM

## 2017-12-12 DIAGNOSIS — I5030 Unspecified diastolic (congestive) heart failure: Secondary | ICD-10-CM

## 2017-12-12 DIAGNOSIS — E11621 Type 2 diabetes mellitus with foot ulcer: Secondary | ICD-10-CM

## 2017-12-12 DIAGNOSIS — Z888 Allergy status to other drugs, medicaments and biological substances status: Secondary | ICD-10-CM

## 2017-12-12 DIAGNOSIS — I480 Paroxysmal atrial fibrillation: Secondary | ICD-10-CM

## 2017-12-12 DIAGNOSIS — R011 Cardiac murmur, unspecified: Secondary | ICD-10-CM

## 2017-12-12 DIAGNOSIS — I2510 Atherosclerotic heart disease of native coronary artery without angina pectoris: Secondary | ICD-10-CM

## 2017-12-12 DIAGNOSIS — I340 Nonrheumatic mitral (valve) insufficiency: Secondary | ICD-10-CM

## 2017-12-12 LAB — BLOOD CULTURE ID PANEL (REFLEXED)
ACINETOBACTER BAUMANNII: NOT DETECTED
Candida albicans: NOT DETECTED
Candida glabrata: NOT DETECTED
Candida krusei: NOT DETECTED
Candida parapsilosis: NOT DETECTED
Candida tropicalis: NOT DETECTED
Enterobacter cloacae complex: NOT DETECTED
Enterobacteriaceae species: NOT DETECTED
Enterococcus species: NOT DETECTED
Escherichia coli: NOT DETECTED
HAEMOPHILUS INFLUENZAE: NOT DETECTED
Klebsiella oxytoca: NOT DETECTED
Klebsiella pneumoniae: NOT DETECTED
LISTERIA MONOCYTOGENES: NOT DETECTED
METHICILLIN RESISTANCE: DETECTED — AB
NEISSERIA MENINGITIDIS: NOT DETECTED
PROTEUS SPECIES: NOT DETECTED
PSEUDOMONAS AERUGINOSA: NOT DETECTED
SERRATIA MARCESCENS: NOT DETECTED
STAPHYLOCOCCUS AUREUS BCID: DETECTED — AB
STAPHYLOCOCCUS SPECIES: DETECTED — AB
STREPTOCOCCUS AGALACTIAE: NOT DETECTED
STREPTOCOCCUS PNEUMONIAE: NOT DETECTED
STREPTOCOCCUS SPECIES: NOT DETECTED
Streptococcus pyogenes: NOT DETECTED

## 2017-12-12 LAB — BASIC METABOLIC PANEL
ANION GAP: 7 (ref 5–15)
BUN: 26 mg/dL — ABNORMAL HIGH (ref 8–23)
CO2: 23 mmol/L (ref 22–32)
CREATININE: 1.29 mg/dL — AB (ref 0.44–1.00)
Calcium: 8 mg/dL — ABNORMAL LOW (ref 8.9–10.3)
Chloride: 102 mmol/L (ref 98–111)
GFR, EST AFRICAN AMERICAN: 44 mL/min — AB (ref >60)
GFR, EST NON AFRICAN AMERICAN: 38 mL/min — AB (ref >60)
GLUCOSE: 322 mg/dL — AB (ref 70–99)
Potassium: 4 mmol/L (ref 3.5–5.1)
Sodium: 132 mmol/L — ABNORMAL LOW (ref 135–145)

## 2017-12-12 LAB — CBC
HCT: 30.1 % — ABNORMAL LOW (ref 36.0–46.0)
HEMOGLOBIN: 9.3 g/dL — AB (ref 12.0–15.0)
MCH: 28.4 pg (ref 26.0–34.0)
MCHC: 30.9 g/dL (ref 30.0–36.0)
MCV: 92 fL (ref 78.0–100.0)
PLATELETS: 146 10*3/uL — AB (ref 150–400)
RBC: 3.27 MIL/uL — ABNORMAL LOW (ref 3.87–5.11)
RDW: 14.9 % (ref 11.5–15.5)
WBC: 16 10*3/uL — ABNORMAL HIGH (ref 4.0–10.5)

## 2017-12-12 LAB — GLUCOSE, CAPILLARY
Glucose-Capillary: 319 mg/dL — ABNORMAL HIGH (ref 70–99)
Glucose-Capillary: 290 mg/dL — ABNORMAL HIGH (ref 70–99)
Glucose-Capillary: 235 mg/dL — ABNORMAL HIGH (ref 70–99)
Glucose-Capillary: 320 mg/dL — ABNORMAL HIGH (ref 70–99)

## 2017-12-12 LAB — ECHOCARDIOGRAM COMPLETE
HEIGHTINCHES: 66 in
WEIGHTICAEL: 2960 [oz_av]

## 2017-12-12 MED ORDER — VANCOMYCIN HCL 10 G IV SOLR
1500.0000 mg | Freq: Once | INTRAVENOUS | Status: AC
  Administered 2017-12-12: 1500 mg via INTRAVENOUS
  Filled 2017-12-12: qty 1500

## 2017-12-12 MED ORDER — VANCOMYCIN HCL IN DEXTROSE 750-5 MG/150ML-% IV SOLN
750.0000 mg | Freq: Two times a day (BID) | INTRAVENOUS | Status: DC
  Administered 2017-12-12 – 2017-12-16 (×8): 750 mg via INTRAVENOUS
  Filled 2017-12-12 (×8): qty 150

## 2017-12-12 NOTE — Progress Notes (Signed)
Inpatient Diabetes Program Recommendations  AACE/ADA: New Consensus Statement on Inpatient Glycemic Control (2015)  Target Ranges:  Prepandial:   less than 140 mg/dL      Peak postprandial:   less than 180 mg/dL (1-2 hours)      Critically ill patients:  140 - 180 mg/dL   Lab Results  Component Value Date   GLUCAP 290 (H) 12/12/2017   HGBA1C 6.0 (H) 11/04/2017    Review of Glycemic Control Results for Kristina Summers, Kristina Summers (MRN [id number]) as of 12/12/2017 14:09  Ref. Range 12/11/2017 21:22 12/12/2017 06:04 12/12/2017 12:07  Glucose-Capillary Latest Ref Range: 70 - 99 mg/dL 314 (H) 319 (H) 290 (H)   Diabetes history: Type 2 DM  Outpatient Diabetes medications: NPH 15 units q AM and 10 units q PM Current orders for Inpatient glycemic control:  NPH 10 units q AM and NPH 7 units q PM Novolog sensitive tid with meals Inpatient Diabetes Program Recommendations:    Please consider increasing NPH to 15 units q AM and NPH 10 units q PM.   Thanks,  Adah Perl, RN, BC-ADM Inpatient Diabetes Coordinator Pager [phone] (8a-5p)

## 2017-12-12 NOTE — Progress Notes (Signed)
PROGRESS NOTE  Kristina Summers PNT:[id number] DOB: Aug 14, 1935 DOA: 12/11/2017 PCP: [facility] [place]  HPI/Recap of past 24 hours:  Still very weak, c/o dysuria Fever seems coming down She denies pain,   Assessment/Plan: Principal Problem:   UTI (urinary tract infection) Active Problems:   CAD (coronary artery disease)   Hyperlipidemia   S/P CABG (coronary artery bypass graft)   Fall   Sepsis ([place])   Essential hypertension   Type II diabetes mellitus with renal manifestations ([place])   S/P TAVR (transcatheter aortic valve replacement)   History of CVA (cerebrovascular accident)   CKD (chronic kidney disease), stage III (HCC)   Acute metabolic encephalopathy   Foot ulcer, right ([place])  Falls /sepsis/metabolic encephalopathy -sepsis with fever, tachycardia, tachypnea and leukocytosis -ct head no acute findings -+mrsa in blood, >100,000 ecolin in urine - cxr "Increased interstitial markings diffusely on the left and confined to the right apex. The findings could represent asymmetric edema or an infectious process. The findings are unchanged since November 08, 2017." she does not have cough, no chest pain ,no hypoxia, lung exam no wheezing, no rales, overall diminished  MRSA bacteremia -she is started on vanc per pharmacy, first dose on 8/26 at 8am. -Unclear source  -Chronic right foot ulcer, but dry, does not appear infected -case discussed with infectious disease Dr Megan Salon who recommended thoracic surgery consult ( recent TAVR in 10/2017) -TTE pending -appreciate infectious disease input -thoracic surgery consulted  Bacteriuria: ->100,000 ecoli  -she received rocephin on presentation -case discussed with infectious disease Dr Megan Salon who recommend hold off on rocephin, suspect patient symptom is largely related to mrsa bacteremia  Insulin dependent dm2 a1c 6 in 10/2017 Continue insulin, adjust prn   Diabetic foot wound on the right 5th metatarsal head, plantar surface Wound  care input appreciated: "Do not expect the wound to heal due to the heavy circumferential callus. Dressing procedure/placement/frequency: Betadine, gauze, and kerlex daily. "  H/o severe aortic stenosis s/p  TAVR in 10/2017  Chronic afib/h/o cva ( with residual expressive aphasia) In afib, rate controlled Betablocker and eiiquis  H/o gi bleed No issue currently, monitor  Chronic diastolic chf euvolemic currently, home meds lasix held Close monitor volume status  CAD s/p CABG (2000) Asa/bb/statin  CKDIII Cr close to baseline, monitor, renal dosing meds  H/o PAD s/p several LE interventions  Denies leg pain  FTT: PT recommended snf  Code Status: DNR  Family Communication: patient   Disposition Plan: not ready to discharge, likely will need SNF   Consultants:  ID, case discussed with Dr Megan Salon  Thoracic surgery  Procedures:  none  Antibiotics:  As above   Objective: BP (!) 132/48   Pulse 84   Temp 97.7 F (36.5 C) (Oral)   Resp 19   Ht 5\' 6"  (1.676 m)   Wt 83.9 kg   SpO2 99%   BMI 29.86 kg/m   Intake/Output Summary (Last 24 hours) at 12/12/2017 0867 Last data filed at 12/12/2017 0630 Gross per 24 hour  Intake 1850.73 ml  Output 520 ml  Net 1330.73 ml   Filed Weights   12/11/17 1530  Weight: 83.9 kg    Exam: Patient is examined daily including today on 12/12/2017, exams remain the same as of yesterday except that has changed    General:  Frail, but NAD, baseline dysarthria  Cardiovascular: IRRR  Respiratory: CTABL  Abdomen: Soft/ND/NT, positive BS  Musculoskeletal: No Edema, right foot ulcer ,dry, no erythema, no drainage, no odor, nontender  Neuro: alert, oriented   Data Reviewed: Basic Metabolic Panel: Recent Labs  Lab 12/11/17 1550 12/12/17 0416  NA 131* 132*  K 4.4 4.0  CL 97* 102  CO2 25 23  GLUCOSE 402* 322*  BUN 23 26*  CREATININE 1.18* 1.29*  CALCIUM 9.1 8.0*   Liver Function Tests: Recent Labs  Lab  12/11/17 1550  AST 37  ALT 20  ALKPHOS 120  BILITOT 1.0  PROT 7.8  ALBUMIN 2.9*   No results for input(s): LIPASE, AMYLASE in the last 168 hours. No results for input(s): AMMONIA in the last 168 hours. CBC: Recent Labs  Lab 12/11/17 1550 12/12/17 0416  WBC 11.0* 16.0*  NEUTROABS 10.0*  --   HGB 10.8* 9.3*  HCT 35.0* 30.1*  MCV 91.6 92.0  PLT 183 146*   Cardiac Enzymes:   No results for input(s): CKTOTAL, CKMB, CKMBINDEX, TROPONINI in the last 168 hours. BNP (last 3 results) Recent Labs    08/25/17 0323 11/04/17 0815 12/11/17 2132  BNP 432.3* 972.6* 1,270.0*    ProBNP (last 3 results) No results for input(s): PROBNP in the last 8760 hours.  CBG: Recent Labs  Lab 12/11/17 2122 12/12/17 0604  GLUCAP 314* 319*    Recent Results (from the past 240 hour(s))  Blood Culture (routine x 2)     Status: None (Preliminary result)   Collection Time: 12/11/17  3:50 PM  Result Value Ref Range Status   Specimen Description BLOOD LEFT ANTECUBITAL  Final   Special Requests   Final    BOTTLES DRAWN AEROBIC AND ANAEROBIC Blood Culture adequate volume   Culture  Setup Time   Final    GRAM POSITIVE COCCI IN CLUSTERS IN BOTH AEROBIC AND ANAEROBIC BOTTLES CRITICAL RESULT CALLED TO, READ BACK BY AND VERIFIED WITH: PHARMD B MANCHERIE 12/12/17 AT 637 AM BY CM Performed at [facility], [place] [address], [place], [place] [address]    Culture PENDING  Incomplete   Report Status PENDING  Incomplete  Blood Culture ID Panel (Reflexed)     Status: Abnormal   Collection Time: 12/11/17  3:50 PM  Result Value Ref Range Status   Enterococcus species NOT DETECTED NOT DETECTED Final   Listeria monocytogenes NOT DETECTED NOT DETECTED Final   Staphylococcus species DETECTED (A) NOT DETECTED Final    Comment: CRITICAL RESULT CALLED TO, READ BACK BY AND VERIFIED WITH: PHARMD B MANCHERIE 12/12/17 AT 637 BY CM    Staphylococcus aureus DETECTED (A) NOT DETECTED Final    Comment: Methicillin  (oxacillin)-resistant Staphylococcus aureus (MRSA). MRSA is predictably resistant to beta-lactam antibiotics (except ceftaroline). Preferred therapy is vancomycin unless clinically contraindicated. Patient requires contact precautions if  hospitalized. CRITICAL RESULT CALLED TO, READ BACK BY AND VERIFIED WITH: PHARMD B MANCHERIE 12/12/17 AT 637 AM BY CM    Methicillin resistance DETECTED (A) NOT DETECTED Final    Comment: CRITICAL RESULT CALLED TO, READ BACK BY AND VERIFIED WITH: PHARMD B MANCHERIE 12/12/17 AT 637 BY CM    Streptococcus species NOT DETECTED NOT DETECTED Final   Streptococcus agalactiae NOT DETECTED NOT DETECTED Final   Streptococcus pneumoniae NOT DETECTED NOT DETECTED Final   Streptococcus pyogenes NOT DETECTED NOT DETECTED Final   Acinetobacter baumannii NOT DETECTED NOT DETECTED Final   Enterobacteriaceae species NOT DETECTED NOT DETECTED Final   Enterobacter cloacae complex NOT DETECTED NOT DETECTED Final   Escherichia coli NOT DETECTED NOT DETECTED Final   Klebsiella oxytoca NOT DETECTED NOT DETECTED Final   Klebsiella pneumoniae NOT  DETECTED NOT DETECTED Final   Proteus species NOT DETECTED NOT DETECTED Final   Serratia marcescens NOT DETECTED NOT DETECTED Final   Haemophilus influenzae NOT DETECTED NOT DETECTED Final   Neisseria meningitidis NOT DETECTED NOT DETECTED Final   Pseudomonas aeruginosa NOT DETECTED NOT DETECTED Final   Candida albicans NOT DETECTED NOT DETECTED Final   Candida glabrata NOT DETECTED NOT DETECTED Final   Candida krusei NOT DETECTED NOT DETECTED Final   Candida parapsilosis NOT DETECTED NOT DETECTED Final   Candida tropicalis NOT DETECTED NOT DETECTED Final    Comment: Performed at [facility], [place] [address] [place], [place] [address]  Blood Culture (routine x 2)     Status: None (Preliminary result)   Collection Time: 12/11/17  4:52 PM  Result Value Ref Range Status   Specimen Description BLOOD RIGHT FOREARM  Final   Special  Requests   Final    BOTTLES DRAWN AEROBIC AND ANAEROBIC Blood Culture adequate volume   Culture  Setup Time   Final    GRAM POSITIVE COCCI IN BOTH AEROBIC AND ANAEROBIC BOTTLES Performed at [facility], [place] [address], [place], [place] [address]    Culture [facility] POSITIVE COCCI  Final   Report Status PENDING  Incomplete     Studies: Ct Head Wo Contrast  Result Date: 12/11/2017 CLINICAL DATA:  Patient fell. EXAM: CT HEAD WITHOUT CONTRAST TECHNIQUE: Contiguous axial images were obtained from the base of the skull through the vertex without intravenous contrast. COMPARISON:  July 08, 2017 FINDINGS: Brain: Evaluation mildly marred due to motion. Within this limitation, no subdural, epidural, or subarachnoid hemorrhage. No mass effect or midline shift. The left frontal parietal infarct is again identified. No acute cortical ischemia infarct noted. Cerebellum and brainstem are normal. Basal cisterns are normal. Vascular: Calcified atherosclerosis in the intracranial carotids. Skull: Normal. Negative for fracture or focal lesion. Sinuses/Orbits: No acute finding. Other: None. IMPRESSION: 1. No acute intracranial abnormalities noted. 2. Chronic left frontal parietal infarct. Electronically Signed   By: Dorise Bullion III M.D   On: 12/11/2017 18:32   Dg Chest Port 1 View  Result Date: 12/11/2017 CLINICAL DATA:  Fever. EXAM: PORTABLE CHEST 1 VIEW COMPARISON:  November 08, 2016 FINDINGS: Cardiomegaly. The hila and mediastinum are normal. Haziness/increased interstitial markings in the right apex and diffusely on the left. No focal infiltrate. No other acute abnormalities. IMPRESSION: Increased interstitial markings diffusely on the left and confined to the right apex. The findings could represent asymmetric edema or an infectious process. The findings are unchanged since November 08, 2017. Electronically Signed   By: Dorise Bullion III M.D   On: 12/11/2017 16:04    Scheduled Meds: . apixaban  5 mg Oral  BID  . aspirin  81 mg Oral QHS  . atorvastatin  80 mg Oral QHS  . insulin aspart  0-9 Units Subcutaneous TID WC  . insulin NPH Human  10 Units Subcutaneous QAC breakfast   And  . insulin NPH Human  7 Units Subcutaneous QHS  . metoprolol tartrate  12.5 mg Oral BID  . multivitamin with minerals  1 tablet Oral Daily    Continuous Infusions: . cefTRIAXone (ROCEPHIN)  IV Stopped (12/11/17 1832)  . vancomycin 1,500 mg (12/12/17 0818)  . vancomycin       Time spent: 35 mins, case discussed with infectious disease and thoracic surgery I have personally reviewed and interpreted on  12/12/2017 daily labs, tele strips, imagings as discussed above under date  review session and assessment and plans.  I reviewed all nursing notes, pharmacy notes, consultant notes,  vitals, pertinent old records  I have discussed plan of care as described above with RN , patient on 12/12/2017   Florencia Reasons MD, PhD  [facility] Pager [phone]. If 7PM-7AM, please contact night-coverage at [email or web address], password [facility] 12/12/2017, 8:32 AM  LOS: 1 day

## 2017-12-12 NOTE — Evaluation (Signed)
Physical Therapy Evaluation Patient Details Name: Kristina Summers MRN: [id number] DOB: Nov 07, 1935 Today's Date: 12/12/2017   History of Present Illness  Ms. Kage is an 82 y/o female with previous medical history of Type 2 diabetes, peripheral vascular disease, paroxysmal atrial fibrillation, hypertension, diastolic heart failure, and most recently status post transcatheter aortic valve replacement on 11/08/2017 admitted with fevers, weakness, and confusion and found to have MRSA bacteremia.   Clinical Impression  Patient presents with decreased independence with mobility due to weakness, decreased balance, decreased activity tolerance and she will benefit from skilled PT in the acute setting to allow return home following SNF level rehab stay.     Follow Up Recommendations SNF;Supervision/Assistance - 24 hour    Equipment Recommendations  None recommended by PT    Recommendations for Other Services       Precautions / Restrictions Precautions Precautions: Fall      Mobility  Bed Mobility Overal bed mobility: Needs Assistance Bed Mobility: Supine to Sit     Supine to sit: Mod assist;HOB elevated     General bed mobility comments: initially let pt attempt to pull up on rail, but increased time and needed assist for scooting to EOB and for sitting balance  Transfers Overall transfer level: Needs assistance Equipment used: Rolling walker (2 wheeled) Transfers: Sit to/from [facility] Sit to Stand: Mod assist Stand pivot transfers: Mod assist       General transfer comment: increased time, lifting help from EOB, stepping to [facility] with RW with assist for balance and cues for safety; stood from [facility] assist for hygiene and moved 3:1 out and placed recliner behind  Ambulation/Gait                Stairs            Wheelchair Mobility    Modified Rankin (Stroke Patients Only)       Balance Overall balance assessment: Needs assistance Sitting-balance  support: Bilateral upper extremity supported Sitting balance-Leahy Scale: Poor Sitting balance - Comments: min A for balance at EOB   Standing balance support: Bilateral upper extremity supported Standing balance-Leahy Scale: Poor Standing balance comment: mod A with R leg braced against bed with bilateral UE support on walker                             Pertinent Vitals/Pain Pain Assessment: Faces Faces Pain Scale: Hurts little more Pain Location: R foot with weight bearing Pain Descriptors / Indicators: Grimacing;Guarding Pain Intervention(s): Monitored during session;Repositioned;Limited activity within patient's tolerance    Home Living Family/patient expects to be discharged to:: Private residence Living Arrangements: Alone Available Help at Discharge: Family;Available PRN/intermittently Type of Home: Apartment Home Access: Stairs to enter   Entrance Stairs-Number of Steps: 1 Home Layout: One level Home Equipment: Walker - 4 wheels;Cane - single point;Tub bench      Prior Function Level of Independence: Independent with assistive device(s);Needs assistance   Gait / Transfers Assistance Needed: uses rollator  ADL's / Homemaking Assistance Needed: daughter gets her groceries  Comments: pt walks with rollator in home and cane at times if daughter is with her     Hand Dominance        Extremity/Trunk Assessment   Upper Extremity Assessment Upper Extremity Assessment: Generalized weakness    Lower Extremity Assessment Lower Extremity Assessment: Generalized weakness    Cervical / Trunk Assessment Cervical / Trunk Assessment: Kyphotic  Communication   Communication: No  difficulties  Cognition Arousal/Alertness: Awake/alert Behavior During Therapy: WFL for tasks assessed/performed Overall Cognitive Status: Within Functional Limits for tasks assessed                                        General Comments General comments (skin  integrity, edema, etc.): son and daughter in at end of treatment and consulted about d/c recs    Exercises     Assessment/Plan    PT Assessment Patient needs continued PT services  PT Problem List Decreased strength;Decreased mobility;Decreased activity tolerance;Decreased balance;Decreased knowledge of use of DME;Decreased knowledge of precautions;Decreased safety awareness       PT Treatment Interventions DME instruction;Therapeutic activities;Gait training;Therapeutic exercise;Patient/family education;Balance training;Functional mobility training    PT Goals (Current goals can be found in the Care Plan section)  Acute Rehab PT Goals Patient Stated Goal: to return to independent PT Goal Formulation: With patient/family Time For Goal Achievement: 12/26/17 Potential to Achieve Goals: Fair    Frequency Min 3X/week   Barriers to discharge        Co-evaluation               AM-PAC PT "6 Clicks" Daily Activity  Outcome Measure Difficulty turning over in bed (including adjusting bedclothes, sheets and blankets)?: A Lot Difficulty moving from lying on back to sitting on the side of the bed? : Unable Difficulty sitting down on and standing up from a chair with arms (e.g., wheelchair, bedside commode, etc,.)?: Unable Help needed moving to and from a bed to chair (including a wheelchair)?: A Lot Help needed walking in hospital room?: Total Help needed climbing 3-5 steps with a railing? : Total 6 Click Score: 8    End of Session Equipment Utilized During Treatment: Gait belt;Oxygen Activity Tolerance: Patient limited by fatigue Patient left: with chair alarm set;with call bell/phone within reach;in chair   PT Visit Diagnosis: Other abnormalities of gait and mobility (R26.89);Muscle weakness (generalized) (M62.81)    Time: 1962-2297 PT Time Calculation (min) (ACUTE ONLY): 36 min   Charges:   PT Evaluation $PT Eval Moderate Complexity: 1 Mod PT Treatments $Therapeutic  Activity: 8-22 mins        [place], [place] [phone] 12/12/2017   Reginia Naas 12/12/2017, 11:31 AM

## 2017-12-12 NOTE — Consult Note (Signed)
[facility] Nurse wound consult note Assessment completed in [facility] 3W17.  No family present.  Primary care RN, Nira Conn, present. Reason for Consult: Right foot wound Wound type: Diabetic foot wound on the right 5th metatarsal head, plantar surface POA: Yes Measurement: 0.4 cm x 0.6 cm x 0.1 cm  Wound bed: Clean Drainage (amount, consistency, odor) dried drainage on existing dressing that appears to be alginate with kerlex overlay. Periwound: Heavily callused.  Do not expect the wound to heal due to the heavy circumferential callus. Dressing procedure/placement/frequency: Betadine, gauze, and kerlex daily.  This was performed for today. Monitor the wound area(s) for worsening of condition such as: Signs/symptoms of infection,  Increase in size,  Development of or worsening of odor, Development of pain, or increased pain at the affected locations.  Notify the medical team if any of these develop.  Thank you for the consult.  Discussed plan of care with the patient and bedside nurse.  [place] nurse will not follow at this time.  Please re-consult the [facility] team if needed.  Val Riles, RN, MSN, CWOCN, CNS-BC, pager [phone]

## 2017-12-12 NOTE — Evaluation (Signed)
Occupational Therapy Evaluation Patient Details Name: Kristina Summers MRN: [id number] DOB: [birth year] Today's Date: 12/12/2017    History of Present Illness Kristina Summers is an 82 y/o female with previous medical history of Type 2 diabetes, peripheral vascular disease, paroxysmal atrial fibrillation, hypertension, diastolic heart failure, and most recently status post transcatheter aortic valve replacement on 11/08/2017 admitted with fevers, weakness, and confusion and found to have MRSA bacteremia.    Clinical Impression   Patient presenting with decreased I in self care, balance, functional mobility, transfers, safety awareness, endurance and strength. Patient reports being mod I PTA. Patient currently functioning min - max A for self care and mod A for functional transfers with RW.Marland Kitchen Patient will benefit from acute OT to increase overall independence in the areas of ADLs, functional mobility, safety awareness in order to safely discharge to next venue of care.    Follow Up Recommendations  SNF;Supervision/Assistance - 24 hour    Equipment Recommendations  Other (comment)(defer to next venue of care)    Recommendations for Other Services Other (comment)(none at this time)     Precautions / Restrictions Precautions Precautions: Fall Restrictions Weight Bearing Restrictions: No      Mobility Bed Mobility Overal bed mobility: Needs Assistance Bed Mobility: Supine to Sit     Supine to sit: Mod assist;HOB elevated     General bed mobility comments: seated in chair upon entering the room  Transfers Overall transfer level: Needs assistance Equipment used: Rolling walker (2 wheeled) Transfers: Sit to/from [facility] Sit to Stand: Mod assist Stand pivot transfers: Mod assist       General transfer comment: increased time, lifting help from EOB, stepping to [facility] with RW with assist for balance and cues for safety; stood from [facility] assist for hygiene and moved 3:1 out and  placed recliner behind    Balance Overall balance assessment: Needs assistance Sitting-balance support: Bilateral upper extremity supported Sitting balance-Leahy Scale: Poor Sitting balance - Comments: min A for balance at EOB   Standing balance support: Bilateral upper extremity supported Standing balance-Leahy Scale: Poor Standing balance comment: mod A with R leg braced against bed with bilateral UE support on walker       ADL either performed or assessed with clinical judgement   ADL Overall ADL's : Needs assistance/impaired      Toilet Transfer: Moderate assistance;Cueing for safety;RW;BSC   Toileting- Clothing Manipulation and Hygiene: Minimal assistance         General ADL Comments: Pt ambulating with min A but needing mod A for functional transfer onto toileting for lifting assistance. Pt needing assistance with clothing management and able to perform hygiene but assistance for thoroughness     Vision Baseline Vision/History: Wears glasses Wears Glasses: At all times              Pertinent Vitals/Pain Pain Assessment: Faces Faces Pain Scale: Hurts a little bit Pain Location: R foot  Pain Descriptors / Indicators: Grimacing;Guarding Pain Intervention(s): Monitored during session;Limited activity within patient's tolerance;Repositioned     Hand Dominance Right   Extremity/Trunk Assessment Upper Extremity Assessment Upper Extremity Assessment: Generalized weakness   Lower Extremity Assessment Lower Extremity Assessment: Generalized weakness   Cervical / Trunk Assessment Cervical / Trunk Assessment: Kyphotic   Communication Communication Communication: No difficulties   Cognition Arousal/Alertness: Awake/alert Behavior During Therapy: WFL for tasks assessed/performed Overall Cognitive Status: Within Functional Limits for tasks assessed           General Comments  son and daughter in  at end of treatment and consulted about d/c [place] expects to be discharged to:: Private residence Living Arrangements: Alone Available Help at Discharge: Family;Available PRN/intermittently Type of Home: Apartment Home Access: Stairs to enter Entrance Stairs-Number of Steps: 1 Entrance Stairs-Rails: None Home Layout: One level     Bathroom Shower/Tub: Teacher, early years/pre: Standard     Home Equipment: Environmental consultant - 4 wheels;Cane - single point;Tub bench          Prior Functioning/Environment Level of Independence: Independent with assistive device(s);Needs assistance  Gait / Transfers Assistance Needed: uses rollator ADL's / Homemaking Assistance Needed: daughter gets her groceries   Comments: pt walks with rollator in home and cane at times if daughter is with her        OT Problem List: Decreased strength;Decreased knowledge of use of DME or AE;Decreased activity tolerance;Impaired balance (sitting and/or standing);Decreased safety awareness;Pain      OT Treatment/Interventions: Self-care/ADL training;Balance training;Therapeutic exercise;Neuromuscular education;Therapeutic activities;Energy conservation;Cognitive remediation/compensation;DME and/or AE instruction;Manual therapy;Patient/family education    OT Goals(Current goals can be found in the care plan section) Acute Rehab OT Goals Patient Stated Goal: to get better OT Goal Formulation: With patient Time For Goal Achievement: 12/26/17 Potential to Achieve Goals: Good ADL Goals Pt Will Perform Grooming: with supervision Pt Will Perform Upper Body Bathing: with supervision Pt Will Perform Lower Body Bathing: with min guard assist Pt Will Perform Upper Body Dressing: with min guard assist Pt Will Perform Lower Body Dressing: with min guard assist Pt Will Transfer to Toilet: with min guard assist Pt Will Perform Toileting - Clothing Manipulation and hygiene: with min guard assist  OT Frequency: Min 2X/week   Barriers to  D/C: Decreased caregiver support             AM-PAC PT "6 Clicks" Daily Activity     Outcome Measure Help from another person eating meals?: A Little Help from another person taking care of personal grooming?: A Little Help from another person toileting, which includes using toliet, bedpan, or urinal?: A Lot Help from another person bathing (including washing, rinsing, drying)?: A Lot Help from another person to put on and taking off regular upper body clothing?: A Little Help from another person to put on and taking off regular lower body clothing?: A Lot 6 Click Score: 15   End of Session Equipment Utilized During Treatment: Rolling walker;Oxygen Nurse Communication: Mobility status  Activity Tolerance: Patient limited by fatigue Patient left: in chair;with call bell/phone within reach;with chair alarm set  OT Visit Diagnosis: Muscle weakness (generalized) (M62.81)                Time: 2353-6144 OT Time Calculation (min): 20 min Charges:  OT General Charges $OT Visit: 1 Visit OT Evaluation $OT Eval Moderate Complexity: 1 Mod    Mida Cory P, MS, OTR/L 12/12/2017, 2:03 PM

## 2017-12-12 NOTE — Progress Notes (Signed)
[facility] Infusion Coordinator will follow pt with ID team to support IV ABX at DC if needed.   If patient discharges after hours, please call [phone].   Kristina Summers 12/12/2017, 3:33 PM

## 2017-12-12 NOTE — Procedures (Signed)
Attempted echo. Patient out of bed, bed unmade, and patient requested that I return in afternoon. Will attempt echo again later.

## 2017-12-12 NOTE — Progress Notes (Signed)
PHARMACY - PHYSICIAN COMMUNICATION CRITICAL VALUE ALERT - BLOOD CULTURE IDENTIFICATION (BCID)  Kristina Summers is an 82 y.o. female who presented to [facility] on 12/11/2017 with  a chief complaint of not feeling and burning on urination.   Assessment:  Patient started on ceftriaxone to cover for UTI and r/o pneumonia. 4/4 BCx are growing GPCs in clusters. BCID revealed 4/4 MRSA.    Name of physician (or Provider) Contacted: [place]  Current antibiotics: Ceftriaxone  Changes to prescribed antibiotics recommended: Add IV Vancomycin  Recommendations accepted by provider  Results for orders placed or performed during the hospital encounter of 12/11/17  Blood Culture ID Panel (Reflexed) (Collected: 12/11/2017  3:50 PM)  Result Value Ref Range   Enterococcus species NOT DETECTED NOT DETECTED   Listeria monocytogenes NOT DETECTED NOT DETECTED   Staphylococcus species DETECTED (A) NOT DETECTED   Staphylococcus aureus DETECTED (A) NOT DETECTED   Methicillin resistance DETECTED (A) NOT DETECTED   Streptococcus species NOT DETECTED NOT DETECTED   Streptococcus agalactiae NOT DETECTED NOT DETECTED   Streptococcus pneumoniae NOT DETECTED NOT DETECTED   Streptococcus pyogenes NOT DETECTED NOT DETECTED   Acinetobacter baumannii NOT DETECTED NOT DETECTED   Enterobacteriaceae species NOT DETECTED NOT DETECTED   Enterobacter cloacae complex NOT DETECTED NOT DETECTED   Escherichia coli NOT DETECTED NOT DETECTED   Klebsiella oxytoca NOT DETECTED NOT DETECTED   Klebsiella pneumoniae NOT DETECTED NOT DETECTED   Proteus species NOT DETECTED NOT DETECTED   Serratia marcescens NOT DETECTED NOT DETECTED   Haemophilus influenzae NOT DETECTED NOT DETECTED   Neisseria meningitidis NOT DETECTED NOT DETECTED   Pseudomonas aeruginosa NOT DETECTED NOT DETECTED   Candida albicans NOT DETECTED NOT DETECTED   Candida glabrata NOT DETECTED NOT DETECTED   Candida krusei NOT DETECTED NOT DETECTED   Candida  parapsilosis NOT DETECTED NOT DETECTED   Candida tropicalis NOT DETECTED NOT DETECTED    Albertina Parr, PharmD., BCPS Clinical Pharmacist Clinical phone for8/26/19until 8AM: [phone]

## 2017-12-12 NOTE — Progress Notes (Signed)
Notified [facility] provider regarding pt BP of 107/42, received order to hold metoprolol. Arlis Porta

## 2017-12-12 NOTE — Consult Note (Signed)
[place] for Infectious Disease    Date of Admission:  12/11/2017     Total days of antibiotics 2               Reason for Consult: MRSA Bacteremia   Referring Provider: [employer] [employer] Primary Care Provider: [facility] [place] [facility]   Assessment/Plan:  Kristina Summers is an 82 y/o female with previous medical history of Type 2 diabetes, peripheral vascular disease, paroxysmal atrial fibrillation, hypertension, diastolic heart failure, and most recently status post transcatheter aortic valve replacement on 11/08/2017 admitted with fevers, weakness, and confusion and found to have MRSA bacteremia. The most likely source of her infection is from her surgery as MRSA is one of the most common microbes found within the first 2 months following surgery. Other sources appear to be unlikely at present with chest x-ray unchanged and nothing in history or physical exam concerning for pneumonia. UTI is possible although unlikely as she has no other symptoms consistent with pyelonephritis. Diabetic foot wound is chronic and callused with no evidence of infection and little concern for osteomyelitis. TTE on 11/09/17 was negative for vegetation, and will require a TEE for confirmation.    1. Discontinue ceftriaxone and continue vancomycin.  2. Repeat blood cultures in the morning.  3. Continue to monitor kidney function on vancomycin the setting of chronic kidney disease. If worsening is noted may need to change to daptomycin.  4. Contact precautions for MRSA      [place] [facility] Staphylococcus aureus bacteremia   Staphylococcus aureus bacteremia (SAB) is associated with a high rate of complications and mortality.  Specific aspects of clinical management are critical to optimizing the outcome of patients with SAB.  Therefore, the [facility] Antimicrobial Management Team [facility]) has initiated an intervention aimed at improving the management of SAB at [facility].  To do so,  Infectious Diseases physicians are providing an evidence-based consult for the management of all patients with SAB.     Yes No Comments  Perform follow-up blood cultures (even if the patient is afebrile) to ensure clearance of bacteremia [x]  []  Ordered for 12/13/17  Remove vascular catheter and obtain follow-up blood cultures after the removal of the catheter []  [x]  None present  Perform echocardiography to evaluate for endocarditis (transthoracic ECHO is 40-50% sensitive, TEE is > 90% sensitive) [x]  []  Previous TTE on 11/09/17 without vegetation; will need TEE  Consult electrophysiologist to evaluate implanted cardiac device (pacemaker, ICD) []  [x]  Not applicable  Ensure source control [x]  []  Pending   Investigate for "metastatic" sites of infection [x]  []  Diabetic foot ulcer present, however without evidence of infection.   Change antibiotic therapy to __________________ [x]  []  On Vancomycin  Estimated duration of IV antibiotic therapy:   [x]  []  Will need at least 4-6 weeks of IV therapy pending testing.      Principal Problem:   MRSA bacteremia Active Problems:   CAD (coronary artery disease)   Hyperlipidemia   S/P CABG (coronary artery bypass graft)   Fall   Sepsis ([place])   Essential hypertension   Type II diabetes mellitus with renal manifestations ([place])   S/P TAVR (transcatheter aortic valve replacement)   History of CVA (cerebrovascular accident)   CKD (chronic kidney disease), stage III ([place])   UTI (urinary tract infection)   Acute metabolic encephalopathy   Foot ulcer, right ([place])   . apixaban  5 mg Oral BID  . aspirin  81 mg Oral QHS  . atorvastatin  80 mg Oral QHS  . insulin aspart  0-9 Units Subcutaneous TID WC  . insulin NPH Human  10 Units Subcutaneous QAC breakfast   And  . insulin NPH Human  7 Units Subcutaneous QHS  . metoprolol tartrate  12.5 mg Oral BID  . multivitamin with minerals  1 tablet Oral Daily     HPI: Kristina Summers is a 82 y.o. female with previous  medical history of type 2 diabetes, peripheral vascular disease, paroxysmal atrial fibrillation, hypertension, diastolic heart failure, and most recently status post femoral transcatheter aortic valve replacement on 11/08/2017 admitted to the hospital with the chief complaint of fever and weakness with a mechanical fall. Temperature in the ED was 100.3, tachycardia, tachypnea, and leukocytosis (11.0).  Chest x-ray with haziness/increased interstitual marking in the right apex and diffuse on the left, which are unchanged since 11/08/17. CT head with no acute intracranial abnormalities and chronic left frontal parietal infarct. UA with moderate hemoglobin and greater than 50 WBC. ED notes with no urinary symptoms and admission notes with increased urinary frequency and burning with urination. Blood cultures were positive for MRSA bacteremia with urine cultures pending. Initially started on ceftriaxone for UTI and now with vancomycin and ceftriaxone.  Kristina Summers had a fever of 104 overnight with increased leukocytosis to 16.0. On admission to unit she continued to remain disoriented with no family at beside. Seen by [place] RN this morning for diabetic foot wound on the right 5th metatarsal head. Wound was heavily callused.   Kristina Summers indicates that she was drinking water at home and not an odor to her urine but was unsure if she was having other symptoms. She was eating and drinking okay. Previous surgical sites healed well with no complications.   Review of Systems: Review of Systems  Constitutional: Positive for chills and fever. Negative for diaphoresis.  Respiratory: Negative for cough, sputum production, shortness of breath and wheezing.   Cardiovascular: Negative for chest pain and leg swelling.  Gastrointestinal: Negative for abdominal pain, constipation, diarrhea, nausea and vomiting.  Genitourinary: Negative for dysuria, flank pain, frequency, hematuria and urgency.  Skin: Negative for rash.      Past Medical History:  Diagnosis Date  . Anemia   . CAD (coronary artery disease)    a. s/p CABG in 2000  . Carotid artery occlusion   . CKD (chronic kidney disease)   . Diastolic CHF ([place]) 89/2119  . Fibromyalgia   . GERD (gastroesophageal reflux disease)   . History of CVA (cerebrovascular accident) 05/24/2014  . History of GI bleed   . Hyperlipidemia   . Hypertension   . Obesity   . PAF (paroxysmal atrial fibrillation) (H[facility])    a. on Eliquis  . Peripheral vascular disease ([place])   . 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 Type II diabetes mellitus ([place]) dx'd 1979    Social History   Tobacco Use  . Smoking status: Former Smoker    Packs/day: 2.00    Years: 30.00    Pack years: 60.00    Types: Cigarettes    Last attempt to quit: 04/19/1990    Years since quitting: 27.6  . Smokeless tobacco: Never Used  . Tobacco comment: stopped smoking cigarettes 1991  Substance Use Topics  . Alcohol use: Not Currently    Alcohol/week: 0.0 standard drinks    Comment: "tried different alcohols when I was  1st married; never drank much at  ALL"  . Drug use: Never    Family History  Problem Relation Age of Onset  . Other Brother        intestinal blockage  . Diabetes Brother     Allergies  Allergen Reactions  . Morphine And Related Nausea And Vomiting    Chest pain    . Lisinopril Cough  . Zoloft [Sertraline Hcl] Nausea Only         OBJECTIVE: Blood pressure (!) 149/68, pulse 94, temperature (!) 100.9 F (38.3 C), temperature source Oral, resp. rate 18, height 5\' 6"  (1.676 m), weight 83.9 kg, SpO2 97 %.  Physical Exam  Constitutional: She is oriented to person, place, and time. She appears well-developed and well-nourished. She is cooperative. She appears ill. No distress. Nasal cannula in place.  Cardiovascular: Normal rate, regular rhythm  and intact distal pulses. Exam reveals no gallop and no friction rub.  Murmur heard. Pulmonary/Chest: Effort normal and breath sounds normal. No stridor. No respiratory distress. She has no wheezes. She has no rales. She exhibits no tenderness.  Neurological: She is alert and oriented to person, place, and time.  Mild expressive aphasia/apraxia  Skin: Skin is warm and dry.  Psychiatric: She has a normal mood and affect.    Lab Results Lab Results  Component Value Date   WBC 16.0 (H) 12/12/2017   HGB 9.3 (L) 12/12/2017   HCT 30.1 (L) 12/12/2017   MCV 92.0 12/12/2017   PLT 146 (L) 12/12/2017    Lab Results  Component Value Date   CREATININE 1.29 (H) 12/12/2017   BUN 26 (H) 12/12/2017   NA 132 (L) 12/12/2017   K 4.0 12/12/2017   CL 102 12/12/2017   CO2 23 12/12/2017    Lab Results  Component Value Date   ALT 20 12/11/2017   AST 37 12/11/2017   ALKPHOS 120 12/11/2017   BILITOT 1.0 12/11/2017     Microbiology: Recent Results (from the past 240 hour(s))  Blood Culture (routine x 2)     Status: None (Preliminary result)   Collection Time: 12/11/17  3:50 PM  Result Value Ref Range Status   Specimen Description BLOOD LEFT ANTECUBITAL  Final   Special Requests   Final    BOTTLES DRAWN AEROBIC AND ANAEROBIC Blood Culture adequate volume   Culture  Setup Time   Final    GRAM POSITIVE COCCI IN CLUSTERS IN BOTH AEROBIC AND ANAEROBIC BOTTLES CRITICAL RESULT CALLED TO, READ BACK BY AND VERIFIED WITH: PHARMD B MANCHERIE 12/12/17 AT 637 AM BY CM Performed at [facility], [place] [address], [place], [place] [address]    Culture PENDING  Incomplete   Report Status PENDING  Incomplete  Blood Culture ID Panel (Reflexed)     Status: Abnormal   Collection Time: 12/11/17  3:50 PM  Result Value Ref Range Status   Enterococcus species NOT DETECTED NOT DETECTED Final   Listeria monocytogenes NOT DETECTED NOT DETECTED Final   Staphylococcus species DETECTED (A) NOT DETECTED Final     Comment: CRITICAL RESULT CALLED TO, READ BACK BY AND VERIFIED WITH: PHARMD B MANCHERIE 12/12/17 AT 637 BY CM    Staphylococcus aureus DETECTED (A) NOT DETECTED Final    Comment: Methicillin (oxacillin)-resistant Staphylococcus aureus (MRSA). MRSA is predictably resistant to beta-lactam antibiotics (except ceftaroline). Preferred therapy is vancomycin unless clinically contraindicated. Patient requires contact precautions if  hospitalized. CRITICAL RESULT CALLED TO, READ BACK BY AND VERIFIED WITH: PHARMD B MANCHERIE  12/12/17 AT 637 AM BY CM    Methicillin resistance DETECTED (A) NOT DETECTED Final    Comment: CRITICAL RESULT CALLED TO, READ BACK BY AND VERIFIED WITH: PHARMD B MANCHERIE 12/12/17 AT 637 BY CM    Streptococcus species NOT DETECTED NOT DETECTED Final   Streptococcus agalactiae NOT DETECTED NOT DETECTED Final   Streptococcus pneumoniae NOT DETECTED NOT DETECTED Final   Streptococcus pyogenes NOT DETECTED NOT DETECTED Final   Acinetobacter baumannii NOT DETECTED NOT DETECTED Final   Enterobacteriaceae species NOT DETECTED NOT DETECTED Final   Enterobacter cloacae complex NOT DETECTED NOT DETECTED Final   Escherichia coli NOT DETECTED NOT DETECTED Final   Klebsiella oxytoca NOT DETECTED NOT DETECTED Final   Klebsiella pneumoniae NOT DETECTED NOT DETECTED Final   Proteus species NOT DETECTED NOT DETECTED Final   Serratia marcescens NOT DETECTED NOT DETECTED Final   Haemophilus influenzae NOT DETECTED NOT DETECTED Final   Neisseria meningitidis NOT DETECTED NOT DETECTED Final   Pseudomonas aeruginosa NOT DETECTED NOT DETECTED Final   Candida albicans NOT DETECTED NOT DETECTED Final   Candida glabrata NOT DETECTED NOT DETECTED Final   Candida krusei NOT DETECTED NOT DETECTED Final   Candida parapsilosis NOT DETECTED NOT DETECTED Final   Candida tropicalis NOT DETECTED NOT DETECTED Final    Comment: Performed at [facility], [place]. [address], [place], [place] [address]  Blood  Culture (routine x 2)     Status: None (Preliminary result)   Collection Time: 12/11/17  4:52 PM  Result Value Ref Range Status   Specimen Description BLOOD RIGHT FOREARM  Final   Special Requests   Final    BOTTLES DRAWN AEROBIC AND ANAEROBIC Blood Culture adequate volume   Culture  Setup Time   Final    GRAM POSITIVE COCCI IN BOTH AEROBIC AND ANAEROBIC BOTTLES Performed at [facility], [place] [address], [place], [place] [address]    Culture [facility] POSITIVE COCCI  Final   Report Status PENDING  Incomplete     Terri Piedra, NP [place] [facility] [phone] Pager  12/12/2017  9:14 AM

## 2017-12-12 NOTE — Progress Notes (Signed)
Pharmacy Antibiotic Note  Kristina Summers is a 82 y.o. female admitted on 12/11/2017 with MRSA bacteremia.  Pharmacy has been consulted for vancomycin dosing. WBC elevated at 16. LA 2.7, PCT 0.46, SCr 1.29. CrCl ~ 35-40 mL/min.   Plan: -Vancomycin 1500 mg IV once, then start vancomycin 750 mg IV Q 12 hours  -Ceftriaxone 2 gm IV Q 24 hours  -Monitor CBC, renal fx, cultures and clinical progress -VT at SS   Height: 5\' 6"  (167.6 cm) Weight: 185 lb (83.9 kg) IBW/kg (Calculated) : 59.3  Temp (24hrs), Avg:100.2 F (37.9 C), Min:97.7 F (36.5 C), Max:104.4 F (40.2 C)  Recent Labs  Lab 12/11/17 1550 12/11/17 1603 12/11/17 2132 12/12/17 0416  WBC 11.0*  --   --  16.0*  CREATININE 1.18*  --   --  1.29*  LATICACIDVEN  --  1.84 2.7*  --     Estimated Creatinine Clearance: 37.9 mL/min (A) (by C-G formula based on SCr of 1.29 mg/dL (H)).    Allergies  Allergen Reactions  . Morphine And Related Nausea And Vomiting    Chest pain    . Lisinopril Cough  . Zoloft [Sertraline Hcl] Nausea Only         Antimicrobials this admission: Ceftriaxone 8/25 >>  Vanc 8/26 >>   Dose adjustments this admission: None   Microbiology results: 4/4 BCx: GPC in clusters (4/4 MRSA on BCID) 8/25 UCx: sent    Thank you for allowing pharmacy to be a part of this patient's care.  Albertina Parr, PharmD., BCPS Clinical Pharmacist Clinical phone for 12/12/17 until 8AM: [phone]

## 2017-12-12 NOTE — Progress Notes (Addendum)
Lab reported lactic acid level of 2.7 to this nurse. I paged [facility] provider to notify of result. Arlis Porta

## 2017-12-12 NOTE — Progress Notes (Signed)
Unable to fully complete admissions documentation due to pt being disoriented with no family at bedside, will report to next shift. Kristina Summers

## 2017-12-12 NOTE — Progress Notes (Signed)
  Echocardiogram 2D Echocardiogram has been performed.  Marybelle Killings 12/12/2017, 3:45 PM

## 2017-12-13 LAB — BASIC METABOLIC PANEL
Anion gap: 7 (ref 5–15)
BUN: 35 mg/dL — ABNORMAL HIGH (ref 8–23)
CHLORIDE: 104 mmol/L (ref 98–111)
CO2: 22 mmol/L (ref 22–32)
CREATININE: 1.18 mg/dL — AB (ref 0.44–1.00)
Calcium: 8.1 mg/dL — ABNORMAL LOW (ref 8.9–10.3)
GFR calc non Af Amer: 42 mL/min — ABNORMAL LOW (ref >60)
GFR, EST AFRICAN AMERICAN: 49 mL/min — AB (ref >60)
GLUCOSE: 288 mg/dL — AB (ref 70–99)
Potassium: 4 mmol/L (ref 3.5–5.1)
Sodium: 133 mmol/L — ABNORMAL LOW (ref 135–145)

## 2017-12-13 LAB — CBC WITH DIFFERENTIAL/PLATELET
Abs Immature Granulocytes: 0.1 10*3/uL (ref 0.0–0.1)
Basophils Absolute: 0.1 10*3/uL (ref 0.0–0.1)
Basophils Relative: 1 %
EOS PCT: 1 %
Eosinophils Absolute: 0.1 10*3/uL (ref 0.0–0.7)
HEMATOCRIT: 27.5 % — AB (ref 36.0–46.0)
HEMOGLOBIN: 8.5 g/dL — AB (ref 12.0–15.0)
Immature Granulocytes: 1 %
LYMPHS ABS: 0.7 10*3/uL (ref 0.7–4.0)
LYMPHS PCT: 7 %
MCH: 28.5 pg (ref 26.0–34.0)
MCHC: 30.9 g/dL (ref 30.0–36.0)
MCV: 92.3 fL (ref 78.0–100.0)
MONO ABS: 0.8 10*3/uL (ref 0.1–1.0)
MONOS PCT: 8 %
Neutro Abs: 8.8 10*3/uL — ABNORMAL HIGH (ref 1.7–7.7)
Neutrophils Relative %: 82 %
Platelets: 139 10*3/uL — ABNORMAL LOW (ref 150–400)
RBC: 2.98 MIL/uL — AB (ref 3.87–5.11)
RDW: 14.9 % (ref 11.5–15.5)
WBC: 10.6 10*3/uL — ABNORMAL HIGH (ref 4.0–10.5)

## 2017-12-13 LAB — GLUCOSE, CAPILLARY
Glucose-Capillary: 242 mg/dL — ABNORMAL HIGH (ref 70–99)
Glucose-Capillary: 310 mg/dL — ABNORMAL HIGH (ref 70–99)
Glucose-Capillary: 277 mg/dL — ABNORMAL HIGH (ref 70–99)
Glucose-Capillary: 215 mg/dL — ABNORMAL HIGH (ref 70–99)

## 2017-12-13 LAB — URINE CULTURE

## 2017-12-13 LAB — VANCOMYCIN, TROUGH: Vancomycin Tr: 25 ug/mL (ref 15–20)

## 2017-12-13 MED ORDER — SACCHAROMYCES BOULARDII 250 MG PO CAPS
250.0000 mg | ORAL_CAPSULE | Freq: Two times a day (BID) | ORAL | Status: DC
  Administered 2017-12-13 – 2017-12-16 (×8): 250 mg via ORAL
  Filled 2017-12-13 (×8): qty 1

## 2017-12-13 MED ORDER — FUROSEMIDE 10 MG/ML IJ SOLN
40.0000 mg | Freq: Every day | INTRAMUSCULAR | Status: DC
  Administered 2017-12-13 – 2017-12-15 (×3): 40 mg via INTRAVENOUS
  Filled 2017-12-13 (×3): qty 4

## 2017-12-13 NOTE — Progress Notes (Signed)
[facility] for Infectious Disease  Date of Admission:  12/11/2017     Total days of antibiotics 3         ASSESSMENT/PLAN  Kristina Summers is on Day 3 of antimicrobial therapy for MRSA bacteremia of presently unclear source with concern for infective endocarditis from her most recent TAVR procedure about 1 month ago. TTE with no vegetations. Cardiology is in agreement with TEE which has been ordered. Fever curve and leukocytosis are down trending. Kristina Summers is more alert and interactive today. Kristina Summers appears to be tolerating vancomycin with no evidence of nephrotoxicity. Repeat blood cultures are pending. Further recommendations pending TEE results.   1. Continue current dose of vancomycin. 2. Await TEE results (likely Thursday or Friday) for further clarification of treatment duration.   Principal Problem:   MRSA bacteremia Active Problems:   S/P TAVR (transcatheter aortic valve replacement)   CAD (coronary artery disease)   Hyperlipidemia   S/P CABG (coronary artery bypass graft)   Fall   Sepsis ([place])   Essential hypertension   Type II diabetes mellitus with renal manifestations ([place])   History of CVA (cerebrovascular accident)   CKD (chronic kidney disease), stage III ([place])   Acute metabolic encephalopathy   Foot ulcer, right ([place])   . apixaban  5 mg Oral BID  . aspirin  81 mg Oral QHS  . atorvastatin  80 mg Oral QHS  . insulin aspart  0-9 Units Subcutaneous TID WC  . insulin NPH Human  10 Units Subcutaneous QAC breakfast   And  . insulin NPH Human  7 Units Subcutaneous QHS  . metoprolol tartrate  12.5 mg Oral BID  . multivitamin with minerals  1 tablet Oral Daily  . saccharomyces boulardii  250 mg Oral BID    SUBJECTIVE:  Mildly elevated temperatures overnight with improved WBC count of 10.6. TTE negative for vegetation. Creatinine stable at 1.18. Repeat blood cultures drawn this morning.  Feeling better today. Denies fevers, chills or sweats.   Allergies  Allergen  Reactions  . Morphine And Related Nausea And Vomiting    Chest pain    . Lisinopril Cough  . Zoloft [Sertraline Hcl] Nausea Only          Review of Systems: Review of Systems  Constitutional: Negative for chills, diaphoresis, fever and weight loss.  Respiratory: Negative for cough, shortness of breath and wheezing.   Cardiovascular: Negative for chest pain and leg swelling.  Gastrointestinal: Negative for abdominal pain, constipation, diarrhea, nausea and vomiting.  Skin: Negative for rash.      OBJECTIVE: Vitals:   12/13/17 0032 12/13/17 0349 12/13/17 0400 12/13/17 0741  BP: (!) 163/65 (!) 152/54  (!) 145/97  Pulse: 95 77  86  Resp: 18 16  16   Temp: 99.6 F (37.6 C) 98.8 F (37.1 C)  98.9 F (37.2 C)  TempSrc: Oral Oral  Oral  SpO2:  (!) 86% 94% 97%  Weight:      Height:       Body mass index is 29.86 kg/m.  Physical Exam  Constitutional: Kristina Summers is oriented to person, place, and time. Kristina Summers appears well-developed and well-nourished. No distress.  Pleasant; lying in bed with head elevated.   Cardiovascular: Normal rate, regular rhythm, normal heart sounds and intact distal pulses. Exam reveals no gallop and no friction rub.  No murmur heard. Pulmonary/Chest: Effort normal and breath sounds normal. No stridor. No respiratory distress. Kristina Summers has no wheezes. Kristina Summers has no rales. Kristina Summers exhibits no tenderness.  Neurological: Kristina Summers is alert and oriented to person, place, and time.  Skin: Skin is warm and dry.  Psychiatric: Kristina Summers has a normal mood and affect. Her behavior is normal. Thought content normal.    Lab Results Lab Results  Component Value Date   WBC 10.6 (H) 12/13/2017   HGB 8.5 (L) 12/13/2017   HCT 27.5 (L) 12/13/2017   MCV 92.3 12/13/2017   PLT 139 (L) 12/13/2017    Lab Results  Component Value Date   CREATININE 1.18 (H) 12/13/2017   BUN 35 (H) 12/13/2017   NA 133 (L) 12/13/2017   K 4.0 12/13/2017   CL 104 12/13/2017   CO2 22 12/13/2017    Lab Results    Component Value Date   ALT 20 12/11/2017   AST 37 12/11/2017   ALKPHOS 120 12/11/2017   BILITOT 1.0 12/11/2017     Microbiology: Recent Results (from the past 240 hour(s))  Blood Culture (routine x 2)     Status: Abnormal (Preliminary result)   Collection Time: 12/11/17  3:50 PM  Result Value Ref Range Status   Specimen Description BLOOD LEFT ANTECUBITAL  Final   Special Requests   Final    BOTTLES DRAWN AEROBIC AND ANAEROBIC Blood Culture adequate volume   Culture  Setup Time   Final    GRAM POSITIVE COCCI IN CLUSTERS IN BOTH AEROBIC AND ANAEROBIC BOTTLES CRITICAL RESULT CALLED TO, READ BACK BY AND VERIFIED WITH: PHARMD B MANCHERIE 12/12/17 AT 76 AM BY CM    Culture (A)  Final    STAPHYLOCOCCUS AUREUS SUSCEPTIBILITIES TO FOLLOW Performed at [facility], [place] [address] [place], [place] [address]    Report Status PENDING  Incomplete  Blood Culture ID Panel (Reflexed)     Status: Abnormal   Collection Time: 12/11/17  3:50 PM  Result Value Ref Range Status   Enterococcus species NOT DETECTED NOT DETECTED Final   Listeria monocytogenes NOT DETECTED NOT DETECTED Final   Staphylococcus species DETECTED (A) NOT DETECTED Final    Comment: CRITICAL RESULT CALLED TO, READ BACK BY AND VERIFIED WITH: PHARMD B MANCHERIE 12/12/17 AT 637 BY CM    Staphylococcus aureus DETECTED (A) NOT DETECTED Final    Comment: Methicillin (oxacillin)-resistant Staphylococcus aureus (MRSA). MRSA is predictably resistant to beta-lactam antibiotics (except ceftaroline). Preferred therapy is vancomycin unless clinically contraindicated. Patient requires contact precautions if  hospitalized. CRITICAL RESULT CALLED TO, READ BACK BY AND VERIFIED WITH: PHARMD B MANCHERIE 12/12/17 AT 637 AM BY CM    Methicillin resistance DETECTED (A) NOT DETECTED Final    Comment: CRITICAL RESULT CALLED TO, READ BACK BY AND VERIFIED WITH: PHARMD B MANCHERIE 12/12/17 AT 637 BY CM    Streptococcus species NOT DETECTED NOT  DETECTED Final   Streptococcus agalactiae NOT DETECTED NOT DETECTED Final   Streptococcus pneumoniae NOT DETECTED NOT DETECTED Final   Streptococcus pyogenes NOT DETECTED NOT DETECTED Final   Acinetobacter baumannii NOT DETECTED NOT DETECTED Final   Enterobacteriaceae species NOT DETECTED NOT DETECTED Final   Enterobacter cloacae complex NOT DETECTED NOT DETECTED Final   Escherichia coli NOT DETECTED NOT DETECTED Final   Klebsiella oxytoca NOT DETECTED NOT DETECTED Final   Klebsiella pneumoniae NOT DETECTED NOT DETECTED Final   Proteus species NOT DETECTED NOT DETECTED Final   Serratia marcescens NOT DETECTED NOT DETECTED Final   Haemophilus influenzae NOT DETECTED NOT DETECTED Final   Neisseria meningitidis NOT DETECTED NOT DETECTED Final   Pseudomonas aeruginosa NOT DETECTED NOT DETECTED Final  Candida albicans NOT DETECTED NOT DETECTED Final   Candida glabrata NOT DETECTED NOT DETECTED Final   Candida krusei NOT DETECTED NOT DETECTED Final   Candida parapsilosis NOT DETECTED NOT DETECTED Final   Candida tropicalis NOT DETECTED NOT DETECTED Final    Comment: Performed at [facility], [place] [address] [place], [place] [address]  Blood Culture (routine x 2)     Status: Abnormal (Preliminary result)   Collection Time: 12/11/17  4:52 PM  Result Value Ref Range Status   Specimen Description BLOOD RIGHT FOREARM  Final   Special Requests   Final    BOTTLES DRAWN AEROBIC AND ANAEROBIC Blood Culture adequate volume   Culture  Setup Time   Final    GRAM POSITIVE COCCI IN BOTH AEROBIC AND ANAEROBIC BOTTLES Performed at [facility], [place] [address], [place], [place] [address]    Culture STAPHYLOCOCCUS AUREUS (A)  Final   Report Status PENDING  Incomplete  Urine culture     Status: Abnormal   Collection Time: 12/11/17  5:30 PM  Result Value Ref Range Status   Specimen Description URINE, RANDOM  Final   Special Requests   Final    NONE Performed at [facility],  [place] [address], [place], [place] [address]    Culture >=100,000 COLONIES/mL ESCHERICHIA COLI (A)  Final   Report Status 12/13/2017 FINAL  Final   Organism ID, Bacteria ESCHERICHIA COLI (A)  Final      Susceptibility   Escherichia coli - MIC*    AMPICILLIN <=2 SENSITIVE Sensitive     CEFAZOLIN <=4 SENSITIVE Sensitive     CEFTRIAXONE <=1 SENSITIVE Sensitive     CIPROFLOXACIN <=0.25 SENSITIVE Sensitive     GENTAMICIN <=1 SENSITIVE Sensitive     IMIPENEM <=0.25 SENSITIVE Sensitive     NITROFURANTOIN <=16 SENSITIVE Sensitive     TRIMETH/SULFA <=20 SENSITIVE Sensitive     AMPICILLIN/SULBACTAM <=2 SENSITIVE Sensitive     PIP/TAZO <=4 SENSITIVE Sensitive     Extended ESBL NEGATIVE Sensitive     * >=100,000 COLONIES/mL ESCHERICHIA COLI     Terri Piedra, NP [place] for Infectious Disease [place] Pager  12/13/2017  10:32 AM

## 2017-12-13 NOTE — Progress Notes (Signed)
Inpatient Diabetes Program Recommendations  AACE/ADA: New Consensus Statement on Inpatient Glycemic Control (2015)  Target Ranges:  Prepandial:   less than 140 mg/dL      Peak postprandial:   less than 180 mg/dL (1-2 hours)      Critically ill patients:  140 - 180 mg/dL   Lab Results  Component Value Date   GLUCAP 242 (H) 12/13/2017   HGBA1C 6.0 (H) 11/04/2017    Review of Glycemic ControlResults for Kristina Summers, Kristina Summers (MRN [id number]) as of 12/13/2017 10:45  Ref. Range 12/12/2017 06:04 12/12/2017 12:07 12/12/2017 16:36 12/12/2017 22:12 12/13/2017 05:41  Glucose-Capillary Latest Ref Range: 70 - 99 mg/dL 319 (H) 290 (H) 235 (H) 320 (H) 242 (H)    Diabetes history: DM 2 Outpatient Diabetes medications: NPH 15 units q AM and 10 units q PM Current orders for Inpatient glycemic control:  NPH 10 units q AM and NPH 7 units q PM Novolog sensitive tid with meals Inpatient Diabetes Program Recommendations:    Please consider increasing NPH to 15 units q AM and NPH 10 units q PM. Text page sent.   Thanks, Adah Perl, RN, BC-ADM Inpatient Diabetes Coordinator Pager [phone] (8a-5p)

## 2017-12-13 NOTE — NC FL2 (Signed)
[place] LEVEL OF CARE SCREENING TOOL     IDENTIFICATION  Patient Name: Kristina Summers Birthdate: [birth year] Sex: female Admission Date (Current Location): 12/11/2017  [facility] and [place] Number:  Herbalist and Address:  [facility]. [facility], [place] [address], [place], [place] [address]      Provider Number: [id number]  Attending Physician Name and Address:  Florencia Reasons, MD  Relative Name and Phone Number:       Current Level of Care: Hospital Recommended Level of Care: [place] Prior Approval Number:    Date Approved/Denied:   PASRR Number: [id number]  Discharge Plan: SNF    Current Diagnoses: Patient Active Problem List   Diagnosis Date Noted  . MRSA bacteremia 12/12/2017  . CKD (chronic kidney disease), stage III ([place]) 12/11/2017  . UTI (urinary tract infection) 12/11/2017  . Acute metabolic encephalopathy 39/06/90  . Foot ulcer, right ([place]) 12/11/2017  . Acute on chronic diastolic heart failure ([place]) 11/08/2017  . 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 Severe aortic stenosis 10/11/2017  . Sepsis ([place]) 06/10/2017  . Essential hypertension 06/10/2017  . Type II diabetes mellitus with renal manifestations ([place]) 06/10/2017  . Fall   . Fracture of distal end of right fibula 05/29/2014  . History of CVA (cerebrovascular accident) 05/24/2014  . Peripheral vascular disease, unspecified ([place]) 01/11/2014  . CAD (coronary artery disease)   . Paroxysmal atrial fibrillation   . Diabetes mellitus type 2 with peripheral artery disease ([place])   . Hyperlipidemia   . History of GI bleed 04/21/2009  . S/P CABG (coronary artery bypass graft) 11/20/1998    Orientation RESPIRATION BLADDER Height & Weight     Self  O2(see DC summary) Incontinent Weight: 185 lb (83.9 kg) Height:  5\' 6"  (167.6 cm)  BEHAVIORAL SYMPTOMS/MOOD NEUROLOGICAL BOWEL NUTRITION STATUS      Continent Diet(heart healthy, carb  modified)  AMBULATORY STATUS COMMUNICATION OF NEEDS Skin   Limited Assist Verbally Other (Comment), Skin abrasions(diabetic foot ulcer, right foot; gauze dressing, changed daily)                       Personal Care Assistance Level of Assistance  Bathing, Feeding, Dressing Bathing Assistance: Limited assistance Feeding assistance: Limited assistance Dressing Assistance: Limited assistance     Functional Limitations Info  Sight, Hearing, Speech Sight Info: Adequate Hearing Info: Adequate Speech Info: Adequate    SPECIAL CARE FACTORS FREQUENCY  PT (By licensed PT), OT (By licensed OT)     PT Frequency: 5x/wk OT Frequency: 5x/wk            Contractures Contractures Info: Not present    Additional Factors Info  Code Status, Allergies, Insulin Sliding Scale Code Status Info: DNR Allergies Info: Morphine And Related, Lisinopril, Zoloft Sertraline Hcl   Insulin Sliding Scale Info: 0-9 units 3x/day with meals; Novolin 10 units daily before breakfast; Novolin 7 units daily at bed       Current Medications (12/13/2017):  This is the current hospital active medication list Current Facility-Administered Medications  Medication Dose Route Frequency Provider Last Rate Last Dose  . acetaminophen (TYLENOL) tablet 650 mg  650 mg Oral Q6H PRN Ivor Costa, MD   650 mg at 12/13/17 3300   Or  . acetaminophen (TYLENOL) suppository 650 mg  650 mg Rectal Q6H PRN Ivor Costa, MD      . apixaban (ELIQUIS) tablet 5 mg  5 mg Oral BID Blaine Hamper,  Soledad Gerlach, MD   5 mg at 12/13/17 1001  . aspirin chewable tablet 81 mg  81 mg Oral QHS Ivor Costa, MD   81 mg at 12/12/17 2334  . atorvastatin (LIPITOR) tablet 80 mg  80 mg Oral QHS Ivor Costa, MD   80 mg at 12/12/17 2334  . furosemide (LASIX) injection 40 mg  40 mg Intravenous Daily Florencia Reasons, MD   40 mg at 12/13/17 1248  . hydrALAZINE (APRESOLINE) injection 5 mg  5 mg Intravenous Q2H PRN Ivor Costa, MD      . insulin aspart (novoLOG) injection 0-9 Units  0-9  Units Subcutaneous TID WC Ivor Costa, MD   7 Units at 12/13/17 1242  . insulin NPH Human (HUMULIN N,NOVOLIN N) injection 10 Units  10 Units Subcutaneous QAC breakfast Ivor Costa, MD   10 Units at 12/13/17 8115   And  . insulin NPH Human (HUMULIN N,NOVOLIN N) injection 7 Units  7 Units Subcutaneous QHS Ivor Costa, MD   7 Units at 12/12/17 2339  . metoprolol tartrate (LOPRESSOR) tablet 12.5 mg  12.5 mg Oral BID Ivor Costa, MD   12.5 mg at 12/13/17 1001  . multivitamin with minerals tablet 1 tablet  1 tablet Oral Daily Ivor Costa, MD   1 tablet at 12/13/17 1001  . nitroGLYCERIN (NITROSTAT) SL tablet 0.4 mg  0.4 mg Sublingual Q5 min PRN Ivor Costa, MD      . ondansetron [facility]) tablet 4 mg  4 mg Oral Q6H PRN Ivor Costa, MD       Or  . ondansetron [facility]) injection 4 mg  4 mg Intravenous Q6H PRN Ivor Costa, MD      . saccharomyces boulardii (FLORASTOR) capsule 250 mg  250 mg Oral BID Schorr, Rhetta Mura, NP   250 mg at 12/13/17 1001  . senna-docusate (Senokot-S) tablet 1 tablet  1 tablet Oral QHS PRN Ivor Costa, MD      . vancomycin (VANCOCIN) IVPB 750 mg/150 ml premix  750 mg Intravenous Q12H Lavenia Atlas, RPH 150 mL/hr at 12/13/17 1008 750 mg at 12/13/17 1008  . zolpidem (AMBIEN) tablet 5 mg  5 mg Oral QHS PRN Ivor Costa, MD         Discharge Medications: Please see discharge summary for a list of discharge medications.  Relevant Imaging Results:  Relevant Lab Results:   Additional Information SS#: [id number]  Geralynn Ochs, LCSW

## 2017-12-13 NOTE — Progress Notes (Signed)
  [facility]   MULTIDISCIPLINARY HEART VALVE TEAM  Structural heart team aware of admission for MRSA bacteremia with unclear source. TTE looks okay with no obvious vegetations or AI but endocarditis cannot be ruled out. TEE recommended. I have made her primary cardiologist, Dr. [name], aware of admission.   Angelena Form PA-C  [facility]

## 2017-12-13 NOTE — Progress Notes (Signed)
Pharmacy notified of vancomycin level of 25 mcg/ml drawn at 1325. Level ordered and drawn in error ~3 hours after dose. This level is not clinically significant. A vancomycin trough is ordered for 8/28 at 0930 prior to 1000 dose. Will f.u with recommendations after this level is resulted.   Marguis Mathieson A. Levada Dy, PharmD, [place] Pager: [phone] Please utilize Amion for appropriate phone number to reach the unit pharmacist ([place])

## 2017-12-13 NOTE — Progress Notes (Signed)
PROGRESS NOTE  Kristina Summers PZW:[id number] DOB: [birth year] DOA: 12/11/2017 PCP: [facility], [place]  Brief Summary:  Patient presents with fever, fall, burning on urination and confusion.  Per her daughter, pt has not been feeling well today. She is mildly confused. Normally she is living alone. She is independent for most of ADL.  Patient is found to have MRSA bacteremia     HPI/Recap of past 24 hours:  Feeling better, no fever,reports dysuria has resolved Reports feeling a little sob, no hypoxia, no edema, She denies pain,  RN is concerned possible blood in stool, patient denies ab pain, no n/v.  Assessment/Plan: Principal Problem:   MRSA bacteremia Active Problems:   CAD (coronary artery disease)   Hyperlipidemia   S/P CABG (coronary artery bypass graft)   Fall   Sepsis ([place])   Essential hypertension   Type II diabetes mellitus with renal manifestations ([place])   S/P TAVR (transcatheter aortic valve replacement)   History of CVA (cerebrovascular accident)   CKD (chronic kidney disease), stage III (HCC)   Acute metabolic encephalopathy   Foot ulcer, right ([place])  Falls /sepsis/metabolic encephalopathy -sepsis with fever, tachycardia, tachypnea and leukocytosis -ct head no acute findings -+mrsa in blood, >100,000 ecolin in urine - cxr "Increased interstitial markings diffusely on the left and confined to the right apex. The findings could represent asymmetric edema or an infectious process. The findings are unchanged since November 08, 2017." she does not have cough, no chest pain ,no hypoxia, lung exam no wheezing, no rales, overall diminished  MRSA bacteremia -she is started on vanc per pharmacy, first dose on 8/26 at 8am. -Unclear source  -Chronic right foot ulcer, but dry, does not appear infected -case discussed with infectious disease Dr Megan Salon who recommended thoracic surgery consult ( recent TAVR in 10/2017) -TTE no vegetation, TEE scheduled on 8/29 -appreciate  infectious disease/cardiology/thoracic surgery input   Bacteriuria: ->100,000 ecoli  -she received rocephin on presentation -case discussed with infectious disease Dr Megan Salon who recommend hold off on rocephin, suspect patient symptom is largely related to mrsa bacteremia  Insulin dependent dm2 a1c 6 in 10/2017 Continue insulin, adjust prn   Diabetic foot wound on the right 5th metatarsal head, plantar surface Wound care input appreciated: "Do not expect the wound to heal due to the heavy circumferential callus. Dressing procedure/placement/frequency: Betadine, gauze, and kerlex daily. "  HTN:  Presented with sepsis Home meds lopressor continued, home meds cozaar and norvasc held since admission Iv lasix started on 8/27  H/o severe aortic stenosis s/p  TAVR in 10/2017  Chronic afib/h/o cva ( with residual expressive aphasia) In afib, rate controlled Betablocker and eiiquis Cardiology Dr [name] aware of admission   Acute on Chronic diastolic chf exacerbation  home meds lasix held on admission due to sepsis Today on 8/27, she c/o feeling sob, lung exam with some bibasilar crackles, no edema Start lasix 40mg  iv daily, monitor bp/cr,  Volume status, may need to increase to 40mg  bid ( she is on lasix 40mg  bid at home)   CAD s/p CABG (2000) Asa/bb/statin  H/o PAD s/p several LE interventions  Denies leg pain  CKDIII Cr close to baseline, monitor, renal dosing meds   Anemia of chronic disease hgb around 10 at baseline, hgb today is 8.5, not sure if reliable as wbc and plt also dropped compare to yesterday Will get FOBT Repeat lab in , monitor  H/o gi bleed Abdomen exam benign Checking FOBT   FTT: PT recommended snf  Code Status: DNR  Family Communication: patient   Disposition Plan: not ready to discharge, likely will need SNF once medically ready   Consultants:  ID, case discussed with Dr Megan Salon  cardiology  Thoracic  surgery  Procedures:  none  Antibiotics:  As above   Objective: BP (!) 142/67 (BP Location: Right Arm)   Pulse 79   Temp 98 F (36.7 C) (Oral)   Resp 16   Ht 5\' 6"  (1.676 m)   Wt 83.9 kg   SpO2 99%   BMI 29.86 kg/m   Intake/Output Summary (Last 24 hours) at 12/13/2017 1208 Last data filed at 12/12/2017 2330 Gross per 24 hour  Intake 390 ml  Output -  Net 390 ml   Filed Weights   12/11/17 1530  Weight: 83.9 kg    Exam: Patient is examined daily including today on 12/13/2017, exams remain the same as of yesterday except that has changed    General:  Frail, but NAD, baseline dysarthria  Cardiovascular: IRRR  Respiratory: mild bibasilar crackles  Abdomen: Soft/ND/NT, positive BS  Musculoskeletal: No Edema, right foot ulcer ,dry, no erythema, no drainage, no odor, nontender  Neuro: alert, oriented , baseline dysarthria   Data Reviewed: Basic Metabolic Panel: Recent Labs  Lab 12/11/17 1550 12/12/17 0416 12/13/17 0506  NA 131* 132* 133*  K 4.4 4.0 4.0  CL 97* 102 104  CO2 25 23 22   GLUCOSE 402* 322* 288*  BUN 23 26* 35*  CREATININE 1.18* 1.29* 1.18*  CALCIUM 9.1 8.0* 8.1*   Liver Function Tests: Recent Labs  Lab 12/11/17 1550  AST 37  ALT 20  ALKPHOS 120  BILITOT 1.0  PROT 7.8  ALBUMIN 2.9*   No results for input(s): LIPASE, AMYLASE in the last 168 hours. No results for input(s): AMMONIA in the last 168 hours. CBC: Recent Labs  Lab 12/11/17 1550 12/12/17 0416 12/13/17 0506  WBC 11.0* 16.0* 10.6*  NEUTROABS 10.0*  --  8.8*  HGB 10.8* 9.3* 8.5*  HCT 35.0* 30.1* 27.5*  MCV 91.6 92.0 92.3  PLT 183 146* 139*   Cardiac Enzymes:   No results for input(s): CKTOTAL, CKMB, CKMBINDEX, TROPONINI in the last 168 hours. BNP (last 3 results) Recent Labs    08/25/17 0323 11/04/17 0815 12/11/17 2132  BNP 432.3* 972.6* 1,270.0*    ProBNP (last 3 results) No results for input(s): PROBNP in the last 8760 hours.  CBG: Recent Labs  Lab  12/12/17 1207 12/12/17 1636 12/12/17 2212 12/13/17 0541 12/13/17 1143  GLUCAP 290* 235* 320* 242* 310*    Recent Results (from the past 240 hour(s))  Blood Culture (routine x 2)     Status: Abnormal (Preliminary result)   Collection Time: 12/11/17  3:50 PM  Result Value Ref Range Status   Specimen Description BLOOD LEFT ANTECUBITAL  Final   Special Requests   Final    BOTTLES DRAWN AEROBIC AND ANAEROBIC Blood Culture adequate volume   Culture  Setup Time   Final    GRAM POSITIVE COCCI IN CLUSTERS IN BOTH AEROBIC AND ANAEROBIC BOTTLES CRITICAL RESULT CALLED TO, READ BACK BY AND VERIFIED WITH: PHARMD B MANCHERIE 12/12/17 AT 637 AM BY CM    Culture (A)  Final    STAPHYLOCOCCUS AUREUS SUSCEPTIBILITIES TO FOLLOW Performed at [facility], [place] [address], [place], [place] [address]    Report Status PENDING  Incomplete  Blood Culture ID Panel (Reflexed)     Status: Abnormal   Collection Time: 12/11/17  3:50  PM  Result Value Ref Range Status   Enterococcus species NOT DETECTED NOT DETECTED Final   Listeria monocytogenes NOT DETECTED NOT DETECTED Final   Staphylococcus species DETECTED (A) NOT DETECTED Final    Comment: CRITICAL RESULT CALLED TO, READ BACK BY AND VERIFIED WITH: PHARMD B MANCHERIE 12/12/17 AT 637 BY CM    Staphylococcus aureus DETECTED (A) NOT DETECTED Final    Comment: Methicillin (oxacillin)-resistant Staphylococcus aureus (MRSA). MRSA is predictably resistant to beta-lactam antibiotics (except ceftaroline). Preferred therapy is vancomycin unless clinically contraindicated. Patient requires contact precautions if  hospitalized. CRITICAL RESULT CALLED TO, READ BACK BY AND VERIFIED WITH: PHARMD B MANCHERIE 12/12/17 AT 637 AM BY CM    Methicillin resistance DETECTED (A) NOT DETECTED Final    Comment: CRITICAL RESULT CALLED TO, READ BACK BY AND VERIFIED WITH: PHARMD B MANCHERIE 12/12/17 AT 637 BY CM    Streptococcus species NOT DETECTED NOT DETECTED Final    Streptococcus agalactiae NOT DETECTED NOT DETECTED Final   Streptococcus pneumoniae NOT DETECTED NOT DETECTED Final   Streptococcus pyogenes NOT DETECTED NOT DETECTED Final   Acinetobacter baumannii NOT DETECTED NOT DETECTED Final   Enterobacteriaceae species NOT DETECTED NOT DETECTED Final   Enterobacter cloacae complex NOT DETECTED NOT DETECTED Final   Escherichia coli NOT DETECTED NOT DETECTED Final   Klebsiella oxytoca NOT DETECTED NOT DETECTED Final   Klebsiella pneumoniae NOT DETECTED NOT DETECTED Final   Proteus species NOT DETECTED NOT DETECTED Final   Serratia marcescens NOT DETECTED NOT DETECTED Final   Haemophilus influenzae NOT DETECTED NOT DETECTED Final   Neisseria meningitidis NOT DETECTED NOT DETECTED Final   Pseudomonas aeruginosa NOT DETECTED NOT DETECTED Final   Candida albicans NOT DETECTED NOT DETECTED Final   Candida glabrata NOT DETECTED NOT DETECTED Final   Candida krusei NOT DETECTED NOT DETECTED Final   Candida parapsilosis NOT DETECTED NOT DETECTED Final   Candida tropicalis NOT DETECTED NOT DETECTED Final    Comment: Performed at [facility], [place]. [address] [place], [place] [address]  Blood Culture (routine x 2)     Status: Abnormal (Preliminary result)   Collection Time: 12/11/17  4:52 PM  Result Value Ref Range Status   Specimen Description BLOOD RIGHT FOREARM  Final   Special Requests   Final    BOTTLES DRAWN AEROBIC AND ANAEROBIC Blood Culture adequate volume   Culture  Setup Time   Final    GRAM POSITIVE COCCI IN BOTH AEROBIC AND ANAEROBIC BOTTLES Performed at [facility], [place] [address] [place], [place] [address]    Culture STAPHYLOCOCCUS AUREUS (A)  Final   Report Status PENDING  Incomplete  Urine culture     Status: Abnormal   Collection Time: 12/11/17  5:30 PM  Result Value Ref Range Status   Specimen Description URINE, RANDOM  Final   Special Requests   Final    NONE Performed at [facility], [place] [address],  [place], [place] [address]    Culture >=100,000 COLONIES/mL ESCHERICHIA COLI (A)  Final   Report Status 12/13/2017 FINAL  Final   Organism ID, Bacteria ESCHERICHIA COLI (A)  Final      Susceptibility   Escherichia coli - MIC*    AMPICILLIN <=2 SENSITIVE Sensitive     CEFAZOLIN <=4 SENSITIVE Sensitive     CEFTRIAXONE <=1 SENSITIVE Sensitive     CIPROFLOXACIN <=0.25 SENSITIVE Sensitive     GENTAMICIN <=1 SENSITIVE Sensitive     IMIPENEM <=0.25 SENSITIVE Sensitive  NITROFURANTOIN <=16 SENSITIVE Sensitive     TRIMETH/SULFA <=20 SENSITIVE Sensitive     AMPICILLIN/SULBACTAM <=2 SENSITIVE Sensitive     PIP/TAZO <=4 SENSITIVE Sensitive     Extended ESBL NEGATIVE Sensitive     * >=100,000 COLONIES/mL ESCHERICHIA COLI     Studies: No results found.  Scheduled Meds: . apixaban  5 mg Oral BID  . aspirin  81 mg Oral QHS  . atorvastatin  80 mg Oral QHS  . insulin aspart  0-9 Units Subcutaneous TID WC  . insulin NPH Human  10 Units Subcutaneous QAC breakfast   And  . insulin NPH Human  7 Units Subcutaneous QHS  . metoprolol tartrate  12.5 mg Oral BID  . multivitamin with minerals  1 tablet Oral Daily  . saccharomyces boulardii  250 mg Oral BID    Continuous Infusions: . vancomycin 750 mg (12/13/17 1008)     Time spent: 35 mins, case discussed with infectious disease  I have personally reviewed and interpreted on  12/13/2017 daily labs, tele strips, imagings as discussed above under date review session and assessment and plans.  I reviewed all nursing notes, pharmacy notes, consultant notes,  vitals, pertinent old records  I have discussed plan of care as described above with RN , patient on 12/13/2017   Florencia Reasons MD, PhD  [facility] Pager [phone]. If 7PM-7AM, please contact night-coverage at [email or web address], password [facility] 12/13/2017, 12:08 PM  LOS: 2 days

## 2017-12-14 LAB — CBC WITH DIFFERENTIAL/PLATELET
Abs Immature Granulocytes: 0.1 10*3/uL (ref 0.0–0.1)
BASOS PCT: 1 %
Basophils Absolute: 0 10*3/uL (ref 0.0–0.1)
EOS ABS: 0.2 10*3/uL (ref 0.0–0.7)
EOS PCT: 2 %
HEMATOCRIT: 26.5 % — AB (ref 36.0–46.0)
Hemoglobin: 8.4 g/dL — ABNORMAL LOW (ref 12.0–15.0)
Immature Granulocytes: 1 %
LYMPHS ABS: 0.7 10*3/uL (ref 0.7–4.0)
Lymphocytes Relative: 8 %
MCH: 28.6 pg (ref 26.0–34.0)
MCHC: 31.7 g/dL (ref 30.0–36.0)
MCV: 90.1 fL (ref 78.0–100.0)
Monocytes Absolute: 0.7 10*3/uL (ref 0.1–1.0)
Monocytes Relative: 8 %
NEUTROS PCT: 80 %
Neutro Abs: 6.7 10*3/uL (ref 1.7–7.7)
PLATELETS: 144 10*3/uL — AB (ref 150–400)
RBC: 2.94 MIL/uL — AB (ref 3.87–5.11)
RDW: 14.6 % (ref 11.5–15.5)
WBC: 8.3 10*3/uL (ref 4.0–10.5)

## 2017-12-14 LAB — BASIC METABOLIC PANEL
Anion gap: 7 (ref 5–15)
BUN: 31 mg/dL — AB (ref 8–23)
CALCIUM: 8.1 mg/dL — AB (ref 8.9–10.3)
CO2: 23 mmol/L (ref 22–32)
CREATININE: 1.02 mg/dL — AB (ref 0.44–1.00)
Chloride: 103 mmol/L (ref 98–111)
GFR calc non Af Amer: 51 mL/min — ABNORMAL LOW (ref >60)
GFR, EST AFRICAN AMERICAN: 59 mL/min — AB (ref >60)
Glucose, Bld: 356 mg/dL — ABNORMAL HIGH (ref 70–99)
Potassium: 4.2 mmol/L (ref 3.5–5.1)
SODIUM: 133 mmol/L — AB (ref 135–145)

## 2017-12-14 LAB — CULTURE, BLOOD (ROUTINE X 2)
Special Requests: ADEQUATE
Special Requests: ADEQUATE

## 2017-12-14 LAB — GLUCOSE, CAPILLARY
GLUCOSE-CAPILLARY: 243 mg/dL — AB (ref 70–99)
GLUCOSE-CAPILLARY: 276 mg/dL — AB (ref 70–99)
Glucose-Capillary: 270 mg/dL — ABNORMAL HIGH (ref 70–99)
Glucose-Capillary: 269 mg/dL — ABNORMAL HIGH (ref 70–99)

## 2017-12-14 LAB — VANCOMYCIN, TROUGH: VANCOMYCIN TR: 16 ug/mL (ref 15–20)

## 2017-12-14 LAB — MAGNESIUM: Magnesium: 2.2 mg/dL (ref 1.7–2.4)

## 2017-12-14 MED ORDER — SODIUM CHLORIDE 0.9 % IV SOLN
INTRAVENOUS | Status: DC
  Administered 2017-12-14: 22:00:00 via INTRAVENOUS
  Administered 2017-12-15: 500 mL via INTRAVENOUS

## 2017-12-14 MED ORDER — AMLODIPINE BESYLATE 10 MG PO TABS
10.0000 mg | ORAL_TABLET | Freq: Every day | ORAL | Status: DC
  Administered 2017-12-14 – 2017-12-16 (×3): 10 mg via ORAL
  Filled 2017-12-14 (×3): qty 1

## 2017-12-14 MED ORDER — INSULIN NPH (HUMAN) (ISOPHANE) 100 UNIT/ML ~~LOC~~ SUSP
12.0000 [IU] | Freq: Every day | SUBCUTANEOUS | Status: DC
  Administered 2017-12-16: 12 [IU] via SUBCUTANEOUS
  Filled 2017-12-14: qty 10

## 2017-12-14 MED ORDER — INSULIN NPH (HUMAN) (ISOPHANE) 100 UNIT/ML ~~LOC~~ SUSP
9.0000 [IU] | Freq: Every day | SUBCUTANEOUS | Status: DC
  Administered 2017-12-14 – 2017-12-15 (×2): 9 [IU] via SUBCUTANEOUS
  Filled 2017-12-14: qty 10

## 2017-12-14 NOTE — Progress Notes (Signed)
CSW noting per chart review that recommendations have changed to home with home health and per PT note, family can provide 24 hour support. SNF placement not needed.  CSW signing off.  Laveda Abbe, [place] Clinical Social Worker [phone]

## 2017-12-14 NOTE — Progress Notes (Signed)
Physical Therapy Treatment Patient Details Name: Kristina Summers MRN: [id number] DOB: [birth year] Today's Date: 12/14/2017    History of Present Illness Kristina Summers is an 82 y/o female with previous medical history of Type 2 diabetes, peripheral vascular disease, paroxysmal atrial fibrillation, hypertension, diastolic heart failure, and most recently status post transcatheter aortic valve replacement on 11/08/2017 admitted with fevers, weakness, and confusion and found to have MRSA bacteremia.     PT Comments    Patient seen for mobility progression. Pt is making progress toward PT goals and tolerated session well. Pt requires min guard for gait with use of RW and cues safety/navigating around obstacles-likely due to visual deficits at baseline. Pt does continue to demonstrate generalized weakness and decreased activity tolerance.  Recommending HHPT for further skilled PT services to maximize independence and safety with mobility.  Family reports they can provide 24 supervision/assistance upon d/c.  Follow Up Recommendations  Supervision/Assistance - 24 hour;Home health PT     Equipment Recommendations  None recommended by PT    Recommendations for Other Services       Precautions / Restrictions Precautions Precautions: Fall;Other (comment) Precaution Comments: visual deficits at baseline Restrictions Weight Bearing Restrictions: No    Mobility  Bed Mobility Overal bed mobility: Needs Assistance Bed Mobility: Supine to Sit     Supine to sit: Min guard     General bed mobility comments: min guard for safety; increased time and effort   Transfers Overall transfer level: Needs assistance Equipment used: Rolling walker (2 wheeled) Transfers: Sit to/from [facility] Sit to Stand: Min guard;Min assist         General transfer comment: cues for safe hand placement; min guard for safety from bed and commode with use of grab bar  Ambulation/Gait Ambulation/Gait  assistance: Min guard Gait Distance (Feet): 150 Feet Assistive device: Rolling walker (2 wheeled) Gait Pattern/deviations: Step-through pattern;Decreased stride length Gait velocity: decreased   General Gait Details: directional cues while navigating environment due to impaired vision; min guard for safety due to mild instability with directional changes/turning; no LOB   Stairs             Wheelchair Mobility    Modified Rankin (Stroke Patients Only)       Balance Overall balance assessment: Needs assistance Sitting-balance support: No upper extremity supported;Feet supported Sitting balance-Leahy Scale: Fair     Standing balance support: Single extremity supported;During functional activity Standing balance-Leahy Scale: Poor                              Cognition Arousal/Alertness: Awake/alert Behavior During Therapy: WFL for tasks assessed/performed Overall Cognitive Status: Within Functional Limits for tasks assessed                                        Exercises      General Comments        Pertinent Vitals/Pain Pain Assessment: No/denies pain    Home Living                      Prior Function            PT Goals (current goals can now be found in the care plan section) Acute Rehab PT Goals PT Goal Formulation: With patient/family Time For Goal Achievement: 12/26/17 Potential to Achieve Goals: Fair Progress  towards PT goals: Progressing toward goals    Frequency    Min 3X/week      PT Plan Discharge plan needs to be updated    Co-evaluation              AM-PAC PT "6 Clicks" Daily Activity  Outcome Measure  Difficulty turning over in bed (including adjusting bedclothes, sheets and blankets)?: A Lot Difficulty moving from lying on back to sitting on the side of the bed? : A Lot Difficulty sitting down on and standing up from a chair with arms (e.g., wheelchair, bedside commode, etc,.)?:  Unable Help needed moving to and from a bed to chair (including a wheelchair)?: A Little Help needed walking in hospital room?: A Little Help needed climbing 3-5 steps with a railing? : A Little 6 Click Score: 14    End of Session Equipment Utilized During Treatment: Gait belt Activity Tolerance: Patient tolerated treatment well Patient left: with chair alarm set;with call bell/phone within reach;in chair Nurse Communication: Mobility status PT Visit Diagnosis: Other abnormalities of gait and mobility (R26.89);Muscle weakness (generalized) (M62.81)     Time: 0761-5183 PT Time Calculation (min) (ACUTE ONLY): 34 min  Charges:  $Gait Training: 8-22 mins $Therapeutic Activity: 8-22 mins                     Earney Navy, PTA Pager: [phone]     Darliss Cheney 12/14/2017, 12:53 PM

## 2017-12-14 NOTE — Progress Notes (Signed)
Pharmacy Antibiotic Note  Kristina Summers is a 82 y.o. female admitted on 12/11/2017 with MRSA bacteremia.  Pharmacy has been consulted for vancomycin dosing. WBC 8.3.SCr 1.02. CrCl ~ 48 mL/min.   Vancomycin trough drawn at 0845 was 16 mcg/ml. (Goal 15-20 mcg/ml)  Plan:  Continue vancomycin 750 mg IV Q 12 hours  -Monitor CBC, renal fx, cultures and clinical progress -VT as needed   Height: 5\' 6"  (167.6 cm) Weight: 185 lb (83.9 kg) IBW/kg (Calculated) : 59.3  Temp (24hrs), Avg:99.5 F (37.5 C), Min:98 F (36.7 C), Max:100.7 F (38.2 C)  Recent Labs  Lab 12/11/17 1550 12/11/17 1603 12/11/17 2132 12/12/17 0416 12/13/17 0506 12/13/17 1325 12/14/17 0845  WBC 11.0*  --   --  16.0* 10.6*  --  8.3  CREATININE 1.18*  --   --  1.29* 1.18*  --  1.02*  LATICACIDVEN  --  1.84 2.7*  --   --   --   --   VANCOTROUGH  --   --   --   --   --  25* 16    Estimated Creatinine Clearance: 48 mL/min (A) (by C-G formula based on SCr of 1.02 mg/dL (H)).    Allergies  Allergen Reactions  . Morphine And Related Nausea And Vomiting    Chest pain    . Lisinopril Cough  . Zoloft [Sertraline Hcl] Nausea Only         Antimicrobials this admission: Ceftriaxone 8/25  Vanc 8/26 >>   Dose adjustments this admission: VT 16 8/28 No changes  Microbiology results: 4/4 BCx:4/4 MRSA on BCID 8/25 UCx: sent     Faustino Luecke A. Levada Dy, PharmD, [place] Pager: [phone] Please utilize Amion for appropriate phone number to reach the unit pharmacist ([place])

## 2017-12-14 NOTE — Progress Notes (Signed)
      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    After careful review of history and examination, the risks and benefits of transesophageal echocardiogram have been explained including risks of esophageal damage, perforation (1:10,000 risk), bleeding, pharyngeal hematoma as well as other potential complications associated with conscious sedation including aspiration, arrhythmia, respiratory failure and death. Alternatives to treatment were discussed, questions were answered. Patient is willing to proceed.   Glenetta Hew, MD  12/14/2017 6:37 PM

## 2017-12-14 NOTE — Progress Notes (Signed)
[facility]  Date of Admission:  12/11/2017     Total days of antibiotics 4         ASSESSMENT/PLAN  Kristina Summers has MRSA bacteremia of unclear source and is currently on Day 3 of antimicrobial therapy with vancomycin. Trough levels are within therapeutic range and kidney function appears without evidence of nephrotoxicity. She continues to feel well and has a down trending fever curve and now normal WBC count. Awaiting TEE to rule our possibility of infective endocarditis. If negative may need to consider additional testing for source of infection. Repeat cultures are without growth to date.   1. Continue current dose of vancomycin. 2. Await TEE results.  3. Continue to monitor repeat blood culture results.     Principal Problem:   MRSA bacteremia Active Problems:   S/P TAVR (transcatheter aortic valve replacement)   CAD (coronary artery disease)   Hyperlipidemia   S/P CABG (coronary artery bypass graft)   Fall   Sepsis ([place])   Essential hypertension   Type II diabetes mellitus with renal manifestations ([place])   History of CVA (cerebrovascular accident)   CKD (chronic kidney disease), stage III ([place])   Acute metabolic encephalopathy   Foot ulcer, right ([place])   . apixaban  5 mg Oral BID  . aspirin  81 mg Oral QHS  . atorvastatin  80 mg Oral QHS  . furosemide  40 mg Intravenous Daily  . insulin aspart  0-9 Units Subcutaneous TID WC  . insulin NPH Human  10 Units Subcutaneous QAC breakfast   And  . insulin NPH Human  7 Units Subcutaneous QHS  . metoprolol tartrate  12.5 mg Oral BID  . multivitamin with minerals  1 tablet Oral Daily  . saccharomyces boulardii  250 mg Oral BID    SUBJECTIVE:  Mildly elevated temperature last evening of 100.7. Lab work reviewed with WBC count of 8.3, creatinine 1.02, and vancomycin trough level of 16.  Continues to feel good this morning. Has been out of bed and walked around the nurses station with PT. Denies  any pain, fevers or chills.   Allergies  Allergen Reactions  . Morphine And Related Nausea And Vomiting    Chest pain    . Lisinopril Cough  . Zoloft [Sertraline Hcl] Nausea Only          Review of Systems: Review of Systems  Constitutional: Negative for chills and fever.  Respiratory: Negative for cough, shortness of breath and wheezing.   Cardiovascular: Negative for chest pain and leg swelling.  Gastrointestinal: Negative for abdominal pain, constipation, diarrhea, nausea and vomiting.  Genitourinary: Negative for dysuria, flank pain, frequency, hematuria and urgency.  Skin: Negative for rash.  Neurological: Negative for dizziness, weakness and headaches.      OBJECTIVE: Vitals:   12/13/17 1919 12/13/17 2332 12/14/17 0411 12/14/17 0804  BP: (!) 144/58 (!) 155/53 (!) 152/67 (!) 157/76  Pulse: 97 (!) 105 100 96  Resp: 20 18 16 17   Temp: (!) 100.7 F (38.2 C) 99.9 F (37.7 C) 99.9 F (37.7 C) 99.3 F (37.4 C)  TempSrc: Oral Oral Oral Oral  SpO2: 94% 93% 95% 98%  Weight:      Height:       Body mass index is 29.86 kg/m.  Physical Exam  Constitutional: She is oriented to person, place, and time. She appears well-developed and well-nourished. No distress.  Seated in the chair, alert and pleasant.   Cardiovascular: Normal rate, regular  rhythm, normal heart sounds and intact distal pulses. Exam reveals no gallop and no friction rub.  No murmur heard. Pulmonary/Chest: Effort normal and breath sounds normal. No stridor. No respiratory distress. She has no wheezes. She has no rales. She exhibits no tenderness.  Neurological: She is alert and oriented to person, place, and time.  Skin: Skin is warm and dry.  Psychiatric: She has a normal mood and affect. Her behavior is normal.    Lab Results Lab Results  Component Value Date   WBC 8.3 12/14/2017   HGB 8.4 (L) 12/14/2017   HCT 26.5 (L) 12/14/2017   MCV 90.1 12/14/2017   PLT 144 (L) 12/14/2017    Lab Results    Component Value Date   CREATININE 1.02 (H) 12/14/2017   BUN 31 (H) 12/14/2017   NA 133 (L) 12/14/2017   K 4.2 12/14/2017   CL 103 12/14/2017   CO2 23 12/14/2017    Lab Results  Component Value Date   ALT 20 12/11/2017   AST 37 12/11/2017   ALKPHOS 120 12/11/2017   BILITOT 1.0 12/11/2017     Microbiology: Recent Results (from the past 240 hour(s))  Blood Culture (routine x 2)     Status: Abnormal   Collection Time: 12/11/17  3:50 PM  Result Value Ref Range Status   Specimen Description BLOOD LEFT ANTECUBITAL  Final   Special Requests   Final    BOTTLES DRAWN AEROBIC AND ANAEROBIC Blood Culture adequate volume   Culture  Setup Time   Final    GRAM POSITIVE COCCI IN CLUSTERS IN BOTH AEROBIC AND ANAEROBIC BOTTLES CRITICAL RESULT CALLED TO, READ BACK BY AND VERIFIED WITH: PHARMD B MANCHERIE 12/12/17 AT 637 AM BY CM Performed at [facility], [place] [address] [place], [place] [address]    Culture METHICILLIN RESISTANT STAPHYLOCOCCUS AUREUS (A)  Final   Report Status 12/14/2017 FINAL  Final   Organism ID, Bacteria METHICILLIN RESISTANT STAPHYLOCOCCUS AUREUS  Final      Susceptibility   Methicillin resistant staphylococcus aureus - MIC*    CIPROFLOXACIN >=8 RESISTANT Resistant     ERYTHROMYCIN 1 INTERMEDIATE Intermediate     GENTAMICIN <=0.5 SENSITIVE Sensitive     OXACILLIN >=4 RESISTANT Resistant     TETRACYCLINE <=1 SENSITIVE Sensitive     VANCOMYCIN <=0.5 SENSITIVE Sensitive     TRIMETH/SULFA <=10 SENSITIVE Sensitive     CLINDAMYCIN <=0.25 SENSITIVE Sensitive     RIFAMPIN <=0.5 SENSITIVE Sensitive     Inducible Clindamycin NEGATIVE Sensitive     * METHICILLIN RESISTANT STAPHYLOCOCCUS AUREUS  Blood Culture ID Panel (Reflexed)     Status: Abnormal   Collection Time: 12/11/17  3:50 PM  Result Value Ref Range Status   Enterococcus species NOT DETECTED NOT DETECTED Final   Listeria monocytogenes NOT DETECTED NOT DETECTED Final   Staphylococcus species DETECTED (A) NOT  DETECTED Final    Comment: CRITICAL RESULT CALLED TO, READ BACK BY AND VERIFIED WITH: PHARMD B MANCHERIE 12/12/17 AT 637 BY CM    Staphylococcus aureus DETECTED (A) NOT DETECTED Final    Comment: Methicillin (oxacillin)-resistant Staphylococcus aureus (MRSA). MRSA is predictably resistant to beta-lactam antibiotics (except ceftaroline). Preferred therapy is vancomycin unless clinically contraindicated. Patient requires contact precautions if  hospitalized. CRITICAL RESULT CALLED TO, READ BACK BY AND VERIFIED WITH: PHARMD B MANCHERIE 12/12/17 AT 637 AM BY CM    Methicillin resistance DETECTED (A) NOT DETECTED Final    Comment: CRITICAL RESULT CALLED TO, READ BACK BY AND  VERIFIED WITH: PHARMD B MANCHERIE 12/12/17 AT 637 BY CM    Streptococcus species NOT DETECTED NOT DETECTED Final   Streptococcus agalactiae NOT DETECTED NOT DETECTED Final   Streptococcus pneumoniae NOT DETECTED NOT DETECTED Final   Streptococcus pyogenes NOT DETECTED NOT DETECTED Final   Acinetobacter baumannii NOT DETECTED NOT DETECTED Final   Enterobacteriaceae species NOT DETECTED NOT DETECTED Final   Enterobacter cloacae complex NOT DETECTED NOT DETECTED Final   Escherichia coli NOT DETECTED NOT DETECTED Final   Klebsiella oxytoca NOT DETECTED NOT DETECTED Final   Klebsiella pneumoniae NOT DETECTED NOT DETECTED Final   Proteus species NOT DETECTED NOT DETECTED Final   Serratia marcescens NOT DETECTED NOT DETECTED Final   Haemophilus influenzae NOT DETECTED NOT DETECTED Final   Neisseria meningitidis NOT DETECTED NOT DETECTED Final   Pseudomonas aeruginosa NOT DETECTED NOT DETECTED Final   Candida albicans NOT DETECTED NOT DETECTED Final   Candida glabrata NOT DETECTED NOT DETECTED Final   Candida krusei NOT DETECTED NOT DETECTED Final   Candida parapsilosis NOT DETECTED NOT DETECTED Final   Candida tropicalis NOT DETECTED NOT DETECTED Final    Comment: Performed at [facility], [place] [address],  [place], [place] [address]  Blood Culture (routine x 2)     Status: Abnormal   Collection Time: 12/11/17  4:52 PM  Result Value Ref Range Status   Specimen Description BLOOD RIGHT FOREARM  Final   Special Requests   Final    BOTTLES DRAWN AEROBIC AND ANAEROBIC Blood Culture adequate volume   Culture  Setup Time   Final    GRAM POSITIVE COCCI IN BOTH AEROBIC AND ANAEROBIC BOTTLES    Culture (A)  Final    STAPHYLOCOCCUS AUREUS SUSCEPTIBILITIES PERFORMED ON PREVIOUS CULTURE WITHIN THE LAST 5 DAYS. Performed at [facility], [place] [address], [place], [place] [address]    Report Status 12/14/2017 FINAL  Final  Urine culture     Status: Abnormal   Collection Time: 12/11/17  5:30 PM  Result Value Ref Range Status   Specimen Description URINE, RANDOM  Final   Special Requests   Final    NONE Performed at [facility], [place] [address], [place], [place] [address]    Culture >=100,000 COLONIES/mL ESCHERICHIA COLI (A)  Final   Report Status 12/13/2017 FINAL  Final   Organism ID, Bacteria ESCHERICHIA COLI (A)  Final      Susceptibility   Escherichia coli - MIC*    AMPICILLIN <=2 SENSITIVE Sensitive     CEFAZOLIN <=4 SENSITIVE Sensitive     CEFTRIAXONE <=1 SENSITIVE Sensitive     CIPROFLOXACIN <=0.25 SENSITIVE Sensitive     GENTAMICIN <=1 SENSITIVE Sensitive     IMIPENEM <=0.25 SENSITIVE Sensitive     NITROFURANTOIN <=16 SENSITIVE Sensitive     TRIMETH/SULFA <=20 SENSITIVE Sensitive     AMPICILLIN/SULBACTAM <=2 SENSITIVE Sensitive     PIP/TAZO <=4 SENSITIVE Sensitive     Extended ESBL NEGATIVE Sensitive     * >=100,000 COLONIES/mL ESCHERICHIA COLI  Culture, blood (routine x 2)     Status: None (Preliminary result)   Collection Time: 12/13/17  5:00 AM  Result Value Ref Range Status   Specimen Description BLOOD RIGHT ANTECUBITAL  Final   Special Requests   Final    BOTTLES DRAWN AEROBIC ONLY Blood Culture adequate volume   Culture   Final    NO GROWTH 1 DAY Performed at [facility], [place] [address],  [place], [place] [address]    Report Status PENDING  Incomplete  Culture, blood (routine x 2)     Status: None (Preliminary result)   Collection Time: 12/13/17  5:05 AM  Result Value Ref Range Status   Specimen Description BLOOD RIGHT ANTECUBITAL  Final   Special Requests   Final    BOTTLES DRAWN AEROBIC ONLY Blood Culture adequate volume   Culture   Final    NO GROWTH 1 DAY Performed at [facility], [place] [address] [place], [place] [address]    Report Status PENDING  Incomplete     Terri Piedra, NP [facility] [place] Pager  12/14/2017  11:37 AM

## 2017-12-14 NOTE — H&P (View-Only) (Signed)
      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    After careful review of history and examination, the risks and benefits of transesophageal echocardiogram have been explained including risks of esophageal damage, perforation (1:10,000 risk), bleeding, pharyngeal hematoma as well as other potential complications associated with conscious sedation including aspiration, arrhythmia, respiratory failure and death. Alternatives to treatment were discussed, questions were answered. Patient is willing to proceed.   Glenetta Hew, MD  12/14/2017 6:37 PM

## 2017-12-14 NOTE — Progress Notes (Signed)
PROGRESS NOTE    Kristina Summers  UTM:[id number] DOB: Aug 15, 1935 DOA: 12/11/2017 PCP: [facility]     Brief Narrative:  82 year old woman admitted from home on 8/25 complaining of a fever, burning on urination and confusion.  She normally lives alone and is independent for most of her ADLs.  She was subsequently found to have an E. coli UTI and MRSA bacteremia.   Assessment & Plan:   Principal Problem:   MRSA bacteremia Active Problems:   CAD (coronary artery disease)   Hyperlipidemia   S/P CABG (coronary artery bypass graft)   Fall   Sepsis ([place])   Essential hypertension   Type II diabetes mellitus with renal manifestations ([place])   S/P TAVR (transcatheter aortic valve replacement)   History of CVA (cerebrovascular accident)   CKD (chronic kidney disease), stage III (HCC)   Acute metabolic encephalopathy   Foot ulcer, right ([place])   Sepsis -Due to MRSA bacteremia and E. coli UTI. -Sepsis parameters have resolved. -She is to continue vancomycin for now. -She is scheduled for a TEE on 8/29. -Important to note that she had TAVR in July 2019. -ID is on board. -ID recommends not treating E. coli bacteriuria, he believes most of symptoms are due to MRSA bacteremia.  Acute metabolic encephalopathy -Is significantly improved today, suspect due to MRSA bacteremia.  Type 2 diabetes -CBGs remain elevated. -We will increase morning NPH from 10 to 12 units and nighttime NPH from 7 to 9 units. -Continue to follow and adjust insulin as deemed necessary.  Hypertension -BP remains elevated with systolics in the 568L. -Resume home dose of Norvasc 10 mg daily.  Chronic atrial fibrillation -Rate controlled on a beta-blocker, is on Eliquis for anticoagulation,  Acute on chronic diastolic heart failure -Echo this admission shows an ejection fraction of 55 to 60% not technically sufficient to evaluate LV diastolic function. -Apparently Lasix was held on admission due to sepsis, on  8/27 complained of shortness of breath, lung exam with some crackles and she was started on Lasix 40 mg daily. -On 8/28 on exam I do not hear any crackles, she does not appear to be significantly volume overloaded. -Intake and output is not documented appropriately, continue IV diuresis for now.  Coronary artery disease -Status post CABG in 2000 -Continue aspirin, statin, beta-blocker.  Chronic kidney disease stage III -Creatinine is at baseline, continue to monitor.  Anemia of chronic disease -Hemoglobin has been stable in the 8-9 range, continue to follow, no plans for further intervention at this time.   DVT prophylaxis: Eliquis Code Status: DNR Family Communication: Patient only, no family is at bedside Disposition Plan: TEE scheduled for 8/29 to further determine duration of treatment  Consultants:   Infectious diseases  Cardiology  Procedures:   2D echo as above  Antimicrobials:  Anti-infectives (From admission, onward)   Start     Dose/Rate Route Frequency Ordered Stop   12/12/17 2000  vancomycin (VANCOCIN) IVPB 750 mg/150 ml premix     750 mg 150 mL/hr over 60 Minutes Intravenous Every 12 hours 12/12/17 0651     12/12/17 0715  vancomycin (VANCOCIN) 1,500 mg in sodium chloride 0.9 % 500 mL IVPB     1,500 mg 250 mL/hr over 120 Minutes Intravenous  Once 12/12/17 0644 12/12/17 1018   12/11/17 1715  cefTRIAXone (ROCEPHIN) 2 g in sodium chloride 0.9 % 100 mL IVPB  Status:  Discontinued     2 g 200 mL/hr over 30 Minutes Intravenous Every 24 hours 12/11/17  1714 12/12/17 0930   12/11/17 1715  azithromycin (ZITHROMAX) 500 mg in sodium chloride 0.9 % 250 mL IVPB  Status:  Discontinued     500 mg 250 mL/hr over 60 Minutes Intravenous Every 24 hours 12/11/17 1714 12/11/17 1930       Subjective: Sitting in chair at bedside, pleasant, no complaints  Objective: Vitals:   12/13/17 1919 12/13/17 2332 12/14/17 0411 12/14/17 0804  BP: (!) 144/58 (!) 155/53 (!) 152/67 (!)  157/76  Pulse: 97 (!) 105 100 96  Resp: 20 18 16 17   Temp: (!) 100.7 F (38.2 C) 99.9 F (37.7 C) 99.9 F (37.7 C) 99.3 F (37.4 C)  TempSrc: Oral Oral Oral Oral  SpO2: 94% 93% 95% 98%  Weight:      Height:        Intake/Output Summary (Last 24 hours) at 12/14/2017 1223 Last data filed at 12/14/2017 0600 Gross per 24 hour  Intake 296.43 ml  Output -  Net 296.43 ml   Filed Weights   12/11/17 1530  Weight: 83.9 kg    Examination:  General exam: Alert, awake, oriented x 3 Respiratory system: Clear to auscultation. Respiratory effort normal. Cardiovascular system:RRR.  Systolic murmur present Gastrointestinal system: Abdomen is nondistended, soft and nontender. No organomegaly or masses felt. Normal bowel sounds heard. Central nervous system: Alert and oriented. No focal neurological deficits. Extremities: No C/C/E, +pedal pulses Skin: No rashes, lesions or ulcers Psychiatry: Judgement and insight appear normal. Mood & affect appropriate.     Data Reviewed: I have personally reviewed following labs and imaging studies  CBC: Recent Labs  Lab 12/11/17 1550 12/12/17 0416 12/13/17 0506 12/14/17 0845  WBC 11.0* 16.0* 10.6* 8.3  NEUTROABS 10.0*  --  8.8* 6.7  HGB 10.8* 9.3* 8.5* 8.4*  HCT 35.0* 30.1* 27.5* 26.5*  MCV 91.6 92.0 92.3 90.1  PLT 183 146* 139* 562*   Basic Metabolic Panel: Recent Labs  Lab 12/11/17 1550 12/12/17 0416 12/13/17 0506 12/14/17 0845  NA 131* 132* 133* 133*  K 4.4 4.0 4.0 4.2  CL 97* 102 104 103  CO2 25 23 22 23   GLUCOSE 402* 322* 288* 356*  BUN 23 26* 35* 31*  CREATININE 1.18* 1.29* 1.18* 1.02*  CALCIUM 9.1 8.0* 8.1* 8.1*  MG  --   --   --  2.2   GFR: Estimated Creatinine Clearance: 48 mL/min (A) (by C-G formula based on SCr of 1.02 mg/dL (H)). Liver Function Tests: Recent Labs  Lab 12/11/17 1550  AST 37  ALT 20  ALKPHOS 120  BILITOT 1.0  PROT 7.8  ALBUMIN 2.9*   No results for input(s): LIPASE, AMYLASE in the last 168  hours. No results for input(s): AMMONIA in the last 168 hours. Coagulation Profile: No results for input(s): INR, PROTIME in the last 168 hours. Cardiac Enzymes: No results for input(s): CKTOTAL, CKMB, CKMBINDEX, TROPONINI in the last 168 hours. BNP (last 3 results) No results for input(s): PROBNP in the last 8760 hours. HbA1C: No results for input(s): HGBA1C in the last 72 hours. CBG: Recent Labs  Lab 12/13/17 1143 12/13/17 1725 12/13/17 2157 12/14/17 0625 12/14/17 1149  GLUCAP 310* 277* 215* 243* 276*   Lipid Profile: No results for input(s): CHOL, HDL, LDLCALC, TRIG, CHOLHDL, LDLDIRECT in the last 72 hours. Thyroid Function Tests: No results for input(s): TSH, T4TOTAL, FREET4, T3FREE, THYROIDAB in the last 72 hours. Anemia Panel: No results for input(s): VITAMINB12, FOLATE, FERRITIN, TIBC, IRON, RETICCTPCT in the last 72 hours. Urine analysis:  Component Value Date/Time   COLORURINE YELLOW 12/11/2017 1741   APPEARANCEUR CLOUDY (A) 12/11/2017 1741   LABSPEC 1.015 12/11/2017 1741   PHURINE 5.0 12/11/2017 1741   GLUCOSEU >=500 (A) 12/11/2017 1741   HGBUR MODERATE (A) 12/11/2017 1741   BILIRUBINUR NEGATIVE 12/11/2017 1741   KETONESUR NEGATIVE 12/11/2017 1741   PROTEINUR >=300 (A) 12/11/2017 1741   UROBILINOGEN 1.0 05/29/2014 1052   NITRITE NEGATIVE 12/11/2017 1741   LEUKOCYTESUR LARGE (A) 12/11/2017 1741   Sepsis Labs: @LABRCNTIP (procalcitonin:4,lacticidven:4)  ) Recent Results (from the past 240 hour(s))  Blood Culture (routine x 2)     Status: Abnormal   Collection Time: 12/11/17  3:50 PM  Result Value Ref Range Status   Specimen Description BLOOD LEFT ANTECUBITAL  Final   Special Requests   Final    BOTTLES DRAWN AEROBIC AND ANAEROBIC Blood Culture adequate volume   Culture  Setup Time   Final    GRAM POSITIVE COCCI IN CLUSTERS IN BOTH AEROBIC AND ANAEROBIC BOTTLES CRITICAL RESULT CALLED TO, READ BACK BY AND VERIFIED WITH: PHARMD B MANCHERIE 12/12/17 AT 637  AM BY CM Performed at [facility], [place] [address], [place], [place] [address]    Culture METHICILLIN RESISTANT STAPHYLOCOCCUS AUREUS (A)  Final   Report Status 12/14/2017 FINAL  Final   Organism ID, Bacteria METHICILLIN RESISTANT STAPHYLOCOCCUS AUREUS  Final      Susceptibility   Methicillin resistant staphylococcus aureus - MIC*    CIPROFLOXACIN >=8 RESISTANT Resistant     ERYTHROMYCIN 1 INTERMEDIATE Intermediate     GENTAMICIN <=0.5 SENSITIVE Sensitive     OXACILLIN >=4 RESISTANT Resistant     TETRACYCLINE <=1 SENSITIVE Sensitive     VANCOMYCIN <=0.5 SENSITIVE Sensitive     TRIMETH/SULFA <=10 SENSITIVE Sensitive     CLINDAMYCIN <=0.25 SENSITIVE Sensitive     RIFAMPIN <=0.5 SENSITIVE Sensitive     Inducible Clindamycin NEGATIVE Sensitive     * METHICILLIN RESISTANT STAPHYLOCOCCUS AUREUS  Blood Culture ID Panel (Reflexed)     Status: Abnormal   Collection Time: 12/11/17  3:50 PM  Result Value Ref Range Status   Enterococcus species NOT DETECTED NOT DETECTED Final   Listeria monocytogenes NOT DETECTED NOT DETECTED Final   Staphylococcus species DETECTED (A) NOT DETECTED Final    Comment: CRITICAL RESULT CALLED TO, READ BACK BY AND VERIFIED WITH: PHARMD B MANCHERIE 12/12/17 AT 637 BY CM    Staphylococcus aureus DETECTED (A) NOT DETECTED Final    Comment: Methicillin (oxacillin)-resistant Staphylococcus aureus (MRSA). MRSA is predictably resistant to beta-lactam antibiotics (except ceftaroline). Preferred therapy is vancomycin unless clinically contraindicated. Patient requires contact precautions if  hospitalized. CRITICAL RESULT CALLED TO, READ BACK BY AND VERIFIED WITH: PHARMD B MANCHERIE 12/12/17 AT 637 AM BY CM    Methicillin resistance DETECTED (A) NOT DETECTED Final    Comment: CRITICAL RESULT CALLED TO, READ BACK BY AND VERIFIED WITH: PHARMD B MANCHERIE 12/12/17 AT 637 BY CM    Streptococcus species NOT DETECTED NOT DETECTED Final   Streptococcus agalactiae NOT  DETECTED NOT DETECTED Final   Streptococcus pneumoniae NOT DETECTED NOT DETECTED Final   Streptococcus pyogenes NOT DETECTED NOT DETECTED Final   Acinetobacter baumannii NOT DETECTED NOT DETECTED Final   Enterobacteriaceae species NOT DETECTED NOT DETECTED Final   Enterobacter cloacae complex NOT DETECTED NOT DETECTED Final   Escherichia coli NOT DETECTED NOT DETECTED Final   Klebsiella oxytoca NOT DETECTED NOT DETECTED Final   Klebsiella pneumoniae NOT DETECTED NOT DETECTED Final   Proteus  species NOT DETECTED NOT DETECTED Final   Serratia marcescens NOT DETECTED NOT DETECTED Final   Haemophilus influenzae NOT DETECTED NOT DETECTED Final   Neisseria meningitidis NOT DETECTED NOT DETECTED Final   Pseudomonas aeruginosa NOT DETECTED NOT DETECTED Final   Candida albicans NOT DETECTED NOT DETECTED Final   Candida glabrata NOT DETECTED NOT DETECTED Final   Candida krusei NOT DETECTED NOT DETECTED Final   Candida parapsilosis NOT DETECTED NOT DETECTED Final   Candida tropicalis NOT DETECTED NOT DETECTED Final    Comment: Performed at [facility], [place] [address] [place], [place] [address]  Blood Culture (routine x 2)     Status: Abnormal   Collection Time: 12/11/17  4:52 PM  Result Value Ref Range Status   Specimen Description BLOOD RIGHT FOREARM  Final   Special Requests   Final    BOTTLES DRAWN AEROBIC AND ANAEROBIC Blood Culture adequate volume   Culture  Setup Time   Final    GRAM POSITIVE COCCI IN BOTH AEROBIC AND ANAEROBIC BOTTLES    Culture (A)  Final    STAPHYLOCOCCUS AUREUS SUSCEPTIBILITIES PERFORMED ON PREVIOUS CULTURE WITHIN THE LAST 5 DAYS. Performed at [facility], [place] [address] [place], [place] [address]    Report Status 12/14/2017 FINAL  Final  Urine culture     Status: Abnormal   Collection Time: 12/11/17  5:30 PM  Result Value Ref Range Status   Specimen Description URINE, RANDOM  Final   Special Requests   Final    NONE Performed at [facility], [place] [address], [place], [place] [address]    Culture >=100,000 COLONIES/mL ESCHERICHIA COLI (A)  Final   Report Status 12/13/2017 FINAL  Final   Organism ID, Bacteria ESCHERICHIA COLI (A)  Final      Susceptibility   Escherichia coli - MIC*    AMPICILLIN <=2 SENSITIVE Sensitive     CEFAZOLIN <=4 SENSITIVE Sensitive     CEFTRIAXONE <=1 SENSITIVE Sensitive     CIPROFLOXACIN <=0.25 SENSITIVE Sensitive     GENTAMICIN <=1 SENSITIVE Sensitive     IMIPENEM <=0.25 SENSITIVE Sensitive     NITROFURANTOIN <=16 SENSITIVE Sensitive     TRIMETH/SULFA <=20 SENSITIVE Sensitive     AMPICILLIN/SULBACTAM <=2 SENSITIVE Sensitive     PIP/TAZO <=4 SENSITIVE Sensitive     Extended ESBL NEGATIVE Sensitive     * >=100,000 COLONIES/mL ESCHERICHIA COLI  Culture, blood (routine x 2)     Status: None (Preliminary result)   Collection Time: 12/13/17  5:00 AM  Result Value Ref Range Status   Specimen Description BLOOD RIGHT ANTECUBITAL  Final   Special Requests   Final    BOTTLES DRAWN AEROBIC ONLY Blood Culture adequate volume   Culture   Final    NO GROWTH 1 DAY Performed at [facility], [place] [address], [place], [place] [address]    Report Status PENDING  Incomplete  Culture, blood (routine x 2)     Status: None (Preliminary result)   Collection Time: 12/13/17  5:05 AM  Result Value Ref Range Status   Specimen Description BLOOD RIGHT ANTECUBITAL  Final   Special Requests   Final    BOTTLES DRAWN AEROBIC ONLY Blood Culture adequate volume   Culture   Final    NO GROWTH 1 DAY Performed at [facility], [place] [address] [place], [place] [address]    Report Status PENDING  Incomplete  Radiology Studies: No results found.      Scheduled Meds: . apixaban  5 mg Oral BID  . aspirin  81 mg Oral QHS  . atorvastatin  80 mg Oral QHS  . furosemide  40 mg Intravenous Daily  . insulin aspart  0-9 Units Subcutaneous TID WC  . insulin NPH Human  10 Units Subcutaneous QAC  breakfast   And  . insulin NPH Human  7 Units Subcutaneous QHS  . metoprolol tartrate  12.5 mg Oral BID  . multivitamin with minerals  1 tablet Oral Daily  . saccharomyces boulardii  250 mg Oral BID   Continuous Infusions: . vancomycin 750 mg (12/14/17 0858)     LOS: 3 days    Time spent: 35 minutes. Greater than 50% of this time was spent in direct contact with the patient, coordinating care and discussing relevant ongoing clinical issues, including current diagnoses including MRSA bacteremia, plan for TEE tomorrow to further help [place] determine duration of treatment and further steps to follow.     Lelon Frohlich, MD [facility] Pager [phone]  If 7PM-7AM, please contact night-coverage [email or web address] Password [facility] 12/14/2017, 12:23 PM

## 2017-12-15 ENCOUNTER — Ambulatory Visit: Payer: Medicare Other | Admitting: Physician Assistant

## 2017-12-15 ENCOUNTER — Inpatient Hospital Stay (HOSPITAL_COMMUNITY): Payer: Medicare Other

## 2017-12-15 ENCOUNTER — Encounter (HOSPITAL_COMMUNITY)
Admission: EM | Disposition: A | Discharge status: Home Health | Payer: Self-pay | Source: Home / Self Care | Attending: Internal Medicine

## 2017-12-15 ENCOUNTER — Inpatient Hospital Stay: Payer: Self-pay

## 2017-12-15 ENCOUNTER — Other Ambulatory Visit (HOSPITAL_COMMUNITY): Payer: Medicare Other

## 2017-12-15 ENCOUNTER — Encounter (HOSPITAL_COMMUNITY): Payer: Self-pay

## 2017-12-15 DIAGNOSIS — I10 Essential (primary) hypertension: Secondary | ICD-10-CM

## 2017-12-15 DIAGNOSIS — I340 Nonrheumatic mitral (valve) insufficiency: Secondary | ICD-10-CM

## 2017-12-15 DIAGNOSIS — Z794 Long term (current) use of insulin: Secondary | ICD-10-CM

## 2017-12-15 DIAGNOSIS — G9341 Metabolic encephalopathy: Secondary | ICD-10-CM

## 2017-12-15 DIAGNOSIS — I351 Nonrheumatic aortic (valve) insufficiency: Secondary | ICD-10-CM

## 2017-12-15 DIAGNOSIS — Z7901 Long term (current) use of anticoagulants: Secondary | ICD-10-CM

## 2017-12-15 HISTORY — PX: TEE WITHOUT CARDIOVERSION: SHX5443

## 2017-12-15 LAB — BASIC METABOLIC PANEL
Anion gap: 5 (ref 5–15)
BUN: 30 mg/dL — ABNORMAL HIGH (ref 8–23)
CHLORIDE: 102 mmol/L (ref 98–111)
CO2: 25 mmol/L (ref 22–32)
CREATININE: 1.08 mg/dL — AB (ref 0.44–1.00)
Calcium: 8.1 mg/dL — ABNORMAL LOW (ref 8.9–10.3)
GFR calc non Af Amer: 47 mL/min — ABNORMAL LOW (ref >60)
GFR, EST AFRICAN AMERICAN: 55 mL/min — AB (ref >60)
Glucose, Bld: 198 mg/dL — ABNORMAL HIGH (ref 70–99)
Potassium: 4 mmol/L (ref 3.5–5.1)
Sodium: 132 mmol/L — ABNORMAL LOW (ref 135–145)

## 2017-12-15 LAB — GLUCOSE, CAPILLARY
GLUCOSE-CAPILLARY: 272 mg/dL — AB (ref 70–99)
GLUCOSE-CAPILLARY: 269 mg/dL — AB (ref 70–99)
Glucose-Capillary: 225 mg/dL — ABNORMAL HIGH (ref 70–99)
Glucose-Capillary: 320 mg/dL — ABNORMAL HIGH (ref 70–99)

## 2017-12-15 SURGERY — ECHOCARDIOGRAM, TRANSESOPHAGEAL
Anesthesia: Moderate Sedation

## 2017-12-15 MED ORDER — FUROSEMIDE 40 MG PO TABS
40.0000 mg | ORAL_TABLET | Freq: Every day | ORAL | Status: DC
  Administered 2017-12-16: 40 mg via ORAL
  Filled 2017-12-15: qty 1

## 2017-12-15 MED ORDER — SODIUM CHLORIDE 0.9% FLUSH
10.0000 mL | INTRAVENOUS | Status: DC | PRN
  Administered 2017-12-16: 10 mL
  Filled 2017-12-15: qty 40

## 2017-12-15 MED ORDER — MIDAZOLAM HCL 5 MG/ML IJ SOLN
INTRAMUSCULAR | Status: AC
  Filled 2017-12-15: qty 1

## 2017-12-15 MED ORDER — FENTANYL CITRATE (PF) 100 MCG/2ML IJ SOLN
INTRAMUSCULAR | Status: DC | PRN
  Administered 2017-12-15: 12.5 ug via INTRAVENOUS
  Administered 2017-12-15: 25 ug via INTRAVENOUS

## 2017-12-15 MED ORDER — MIDAZOLAM HCL 10 MG/2ML IJ SOLN
INTRAMUSCULAR | Status: DC | PRN
  Administered 2017-12-15: 1 mg via INTRAVENOUS
  Administered 2017-12-15: 2 mg via INTRAVENOUS

## 2017-12-15 MED ORDER — FENTANYL CITRATE (PF) 100 MCG/2ML IJ SOLN
INTRAMUSCULAR | Status: AC
  Filled 2017-12-15: qty 2

## 2017-12-15 MED ORDER — SODIUM CHLORIDE 0.9% FLUSH
10.0000 mL | Freq: Two times a day (BID) | INTRAVENOUS | Status: DC
  Administered 2017-12-15 – 2017-12-16 (×2): 10 mL

## 2017-12-15 MED ORDER — INSULIN ASPART 100 UNIT/ML ~~LOC~~ SOLN
2.0000 [IU] | Freq: Three times a day (TID) | SUBCUTANEOUS | Status: DC
  Administered 2017-12-15 – 2017-12-16 (×4): 2 [IU] via SUBCUTANEOUS

## 2017-12-15 NOTE — Progress Notes (Signed)
PROGRESS NOTE    Kristina Summers  QIO:[id number] DOB: [birth year] DOA: 12/11/2017 PCP: [facility]    Brief Narrative:  82 year old woman admitted from home on 8/25 complaining of a fever, burning on urination and confusion.  She normally lives alone and is independent for most of her ADLs.  She was subsequently found to have an E. coli UTI and MRSA bacteremia.  Assessment & Plan:   Principal Problem:   MRSA bacteremia Active Problems:   CAD (coronary artery disease)   Hyperlipidemia   S/P CABG (coronary artery bypass graft)   Fall   Sepsis ([place])   Essential hypertension   Type II diabetes mellitus with renal manifestations ([place])   S/P TAVR (transcatheter aortic valve replacement)   History of CVA (cerebrovascular accident)   CKD (chronic kidney disease), stage III (HCC)   Acute metabolic encephalopathy   Foot ulcer, right ([place])   Sepsis from MRSA bacteremia and E coli UTI:  Resolved.  Plan for IV vancomycin as per ID recommendations.  PICC line today.     Acute metabolic encephalopathy  Resolved.    Stage 3 CKD; Creatinine at baseline.    Anemia of chronic disease:  Transfuse to keep hemoglobin greater than 7.  Currently its around 8.    CAD:  S/p CABG IN 2000.  Resume aspirin , statin and BB.    CHRONIC atrial fibrillation:  Rate controlled with BB, and on eliquis for anti coagulation.      Type 2 DM: CBG (last 3)  Recent Labs    12/14/17 1617 12/14/17 2129 12/15/17 0621  GLUCAP 270* 269* 225*   Resume NPH and SSI.  Start her Novolog 2 units TIDAC.    Hypertension;  Well controlled.    Acute on chronic diastolic heart failure:  Echo showed LVEF of 55%.  Started on IV lasix for diuresis. Strict intake and output.  Plan to transition off [place] oxygen Clear on auscultation.  Change to oral lasix from am.      DVT prophylaxis: eLIQUIS.  Code Status: DNR. Family Communication: none at bedside.  Disposition Plan: pending clinical  improvement.    Consultants:   Cardiology.   Infectious disease.    Procedures: TEE on 8/29.    Antimicrobials: vancomycin 8/25.    Subjective:  no chest pain or sob.   Objective: Vitals:   12/15/17 0800 12/15/17 0810 12/15/17 0820 12/15/17 0839  BP: (!) 143/49 (!) 154/47 (!) 157/51 138/65  Pulse: 82 85 79 76  Resp: 19 17 20 18   Temp:    97.8 F (36.6 C)  TempSrc:    Oral  SpO2: 97% 93% 97% 94%  Weight:      Height:        Intake/Output Summary (Last 24 hours) at 12/15/2017 1132 Last data filed at 12/15/2017 0950 Gross per 24 hour  Intake 766.23 ml  Output -  Net 766.23 ml   Filed Weights   12/11/17 1530  Weight: 83.9 kg    Examination:  General exam: Appears calm and comfortable  Respiratory system: Clear to auscultation. Respiratory effort normal. Cardiovascular system: S1 & S2 heard, RRR. No JVD, murmurs, rubs, gallops or clicks. No pedal edema. Gastrointestinal system: Abdomen is nondistended, soft and nontender. Central nervous system: Alert and oriented. No focal neurological deficits. Extremities: Symmetric 5 x 5 power. Skin: No rashes, lesions or ulcers Psychiatry: . Mood & affect appropriate.     Data Reviewed: I have personally reviewed following labs and imaging studies  CBC:  Recent Labs  Lab 12/11/17 1550 12/12/17 0416 12/13/17 0506 12/14/17 0845  WBC 11.0* 16.0* 10.6* 8.3  NEUTROABS 10.0*  --  8.8* 6.7  HGB 10.8* 9.3* 8.5* 8.4*  HCT 35.0* 30.1* 27.5* 26.5*  MCV 91.6 92.0 92.3 90.1  PLT 183 146* 139* 270*   Basic Metabolic Panel: Recent Labs  Lab 12/11/17 1550 12/12/17 0416 12/13/17 0506 12/14/17 0845 12/15/17 0314  NA 131* 132* 133* 133* 132*  K 4.4 4.0 4.0 4.2 4.0  CL 97* 102 104 103 102  CO2 25 23 22 23 25   GLUCOSE 402* 322* 288* 356* 198*  BUN 23 26* 35* 31* 30*  CREATININE 1.18* 1.29* 1.18* 1.02* 1.08*  CALCIUM 9.1 8.0* 8.1* 8.1* 8.1*  MG  --   --   --  2.2  --    GFR: Estimated Creatinine Clearance: 45.3  mL/min (A) (by C-G formula based on SCr of 1.08 mg/dL (H)). Liver Function Tests: Recent Labs  Lab 12/11/17 1550  AST 37  ALT 20  ALKPHOS 120  BILITOT 1.0  PROT 7.8  ALBUMIN 2.9*   No results for input(s): LIPASE, AMYLASE in the last 168 hours. No results for input(s): AMMONIA in the last 168 hours. Coagulation Profile: No results for input(s): INR, PROTIME in the last 168 hours. Cardiac Enzymes: No results for input(s): CKTOTAL, CKMB, CKMBINDEX, TROPONINI in the last 168 hours. BNP (last 3 results) No results for input(s): PROBNP in the last 8760 hours. HbA1C: No results for input(s): HGBA1C in the last 72 hours. CBG: Recent Labs  Lab 12/14/17 0625 12/14/17 1149 12/14/17 1617 12/14/17 2129 12/15/17 0621  GLUCAP 243* 276* 270* 269* 225*   Lipid Profile: No results for input(s): CHOL, HDL, LDLCALC, TRIG, CHOLHDL, LDLDIRECT in the last 72 hours. Thyroid Function Tests: No results for input(s): TSH, T4TOTAL, FREET4, T3FREE, THYROIDAB in the last 72 hours. Anemia Panel: No results for input(s): VITAMINB12, FOLATE, FERRITIN, TIBC, IRON, RETICCTPCT in the last 72 hours. Sepsis Labs: Recent Labs  Lab 12/11/17 1603 12/11/17 2132  PROCALCITON  --  0.46  LATICACIDVEN 1.84 2.7*    Recent Results (from the past 240 hour(s))  Blood Culture (routine x 2)     Status: Abnormal   Collection Time: 12/11/17  3:50 PM  Result Value Ref Range Status   Specimen Description BLOOD LEFT ANTECUBITAL  Final   Special Requests   Final    BOTTLES DRAWN AEROBIC AND ANAEROBIC Blood Culture adequate volume   Culture  Setup Time   Final    GRAM POSITIVE COCCI IN CLUSTERS IN BOTH AEROBIC AND ANAEROBIC BOTTLES CRITICAL RESULT CALLED TO, READ BACK BY AND VERIFIED WITH: PHARMD B MANCHERIE 12/12/17 AT 637 AM BY CM Performed at [facility], [place] [address] [place], [place] [address]    Culture METHICILLIN RESISTANT STAPHYLOCOCCUS AUREUS (A)  Final   Report Status 12/14/2017 FINAL  Final    Organism ID, Bacteria METHICILLIN RESISTANT STAPHYLOCOCCUS AUREUS  Final      Susceptibility   Methicillin resistant staphylococcus aureus - MIC*    CIPROFLOXACIN >=8 RESISTANT Resistant     ERYTHROMYCIN 1 INTERMEDIATE Intermediate     GENTAMICIN <=0.5 SENSITIVE Sensitive     OXACILLIN >=4 RESISTANT Resistant     TETRACYCLINE <=1 SENSITIVE Sensitive     VANCOMYCIN <=0.5 SENSITIVE Sensitive     TRIMETH/SULFA <=10 SENSITIVE Sensitive     CLINDAMYCIN <=0.25 SENSITIVE Sensitive     RIFAMPIN <=0.5 SENSITIVE Sensitive     Inducible Clindamycin NEGATIVE  Sensitive     * METHICILLIN RESISTANT STAPHYLOCOCCUS AUREUS  Blood Culture ID Panel (Reflexed)     Status: Abnormal   Collection Time: 12/11/17  3:50 PM  Result Value Ref Range Status   Enterococcus species NOT DETECTED NOT DETECTED Final   Listeria monocytogenes NOT DETECTED NOT DETECTED Final   Staphylococcus species DETECTED (A) NOT DETECTED Final    Comment: CRITICAL RESULT CALLED TO, READ BACK BY AND VERIFIED WITH: PHARMD B MANCHERIE 12/12/17 AT 637 BY CM    Staphylococcus aureus DETECTED (A) NOT DETECTED Final    Comment: Methicillin (oxacillin)-resistant Staphylococcus aureus (MRSA). MRSA is predictably resistant to beta-lactam antibiotics (except ceftaroline). Preferred therapy is vancomycin unless clinically contraindicated. Patient requires contact precautions if  hospitalized. CRITICAL RESULT CALLED TO, READ BACK BY AND VERIFIED WITH: PHARMD B MANCHERIE 12/12/17 AT 637 AM BY CM    Methicillin resistance DETECTED (A) NOT DETECTED Final    Comment: CRITICAL RESULT CALLED TO, READ BACK BY AND VERIFIED WITH: PHARMD B MANCHERIE 12/12/17 AT 637 BY CM    Streptococcus species NOT DETECTED NOT DETECTED Final   Streptococcus agalactiae NOT DETECTED NOT DETECTED Final   Streptococcus pneumoniae NOT DETECTED NOT DETECTED Final   Streptococcus pyogenes NOT DETECTED NOT DETECTED Final   Acinetobacter baumannii NOT DETECTED NOT DETECTED Final     Enterobacteriaceae species NOT DETECTED NOT DETECTED Final   Enterobacter cloacae complex NOT DETECTED NOT DETECTED Final   Escherichia coli NOT DETECTED NOT DETECTED Final   Klebsiella oxytoca NOT DETECTED NOT DETECTED Final   Klebsiella pneumoniae NOT DETECTED NOT DETECTED Final   Proteus species NOT DETECTED NOT DETECTED Final   Serratia marcescens NOT DETECTED NOT DETECTED Final   Haemophilus influenzae NOT DETECTED NOT DETECTED Final   Neisseria meningitidis NOT DETECTED NOT DETECTED Final   Pseudomonas aeruginosa NOT DETECTED NOT DETECTED Final   Candida albicans NOT DETECTED NOT DETECTED Final   Candida glabrata NOT DETECTED NOT DETECTED Final   Candida krusei NOT DETECTED NOT DETECTED Final   Candida parapsilosis NOT DETECTED NOT DETECTED Final   Candida tropicalis NOT DETECTED NOT DETECTED Final    Comment: Performed at [facility], [place]. [address] [place], [place] [address]  Blood Culture (routine x 2)     Status: Abnormal   Collection Time: 12/11/17  4:52 PM  Result Value Ref Range Status   Specimen Description BLOOD RIGHT FOREARM  Final   Special Requests   Final    BOTTLES DRAWN AEROBIC AND ANAEROBIC Blood Culture adequate volume   Culture  Setup Time   Final    GRAM POSITIVE COCCI IN BOTH AEROBIC AND ANAEROBIC BOTTLES    Culture (A)  Final    STAPHYLOCOCCUS AUREUS SUSCEPTIBILITIES PERFORMED ON PREVIOUS CULTURE WITHIN THE LAST 5 DAYS. Performed at [facility], [place] [address], [place], [place] [address]    Report Status 12/14/2017 FINAL  Final  Urine culture     Status: Abnormal   Collection Time: 12/11/17  5:30 PM  Result Value Ref Range Status   Specimen Description URINE, RANDOM  Final   Special Requests   Final    NONE Performed at [facility], [place] [address], [place], [place] [address]    Culture >=100,000 COLONIES/mL ESCHERICHIA COLI (A)  Final   Report Status 12/13/2017 FINAL  Final   Organism ID, Bacteria ESCHERICHIA COLI (A)   Final      Susceptibility   Escherichia coli - MIC*    AMPICILLIN <=2  SENSITIVE Sensitive     CEFAZOLIN <=4 SENSITIVE Sensitive     CEFTRIAXONE <=1 SENSITIVE Sensitive     CIPROFLOXACIN <=0.25 SENSITIVE Sensitive     GENTAMICIN <=1 SENSITIVE Sensitive     IMIPENEM <=0.25 SENSITIVE Sensitive     NITROFURANTOIN <=16 SENSITIVE Sensitive     TRIMETH/SULFA <=20 SENSITIVE Sensitive     AMPICILLIN/SULBACTAM <=2 SENSITIVE Sensitive     PIP/TAZO <=4 SENSITIVE Sensitive     Extended ESBL NEGATIVE Sensitive     * >=100,000 COLONIES/mL ESCHERICHIA COLI  Culture, blood (routine x 2)     Status: None (Preliminary result)   Collection Time: 12/13/17  5:00 AM  Result Value Ref Range Status   Specimen Description BLOOD RIGHT ANTECUBITAL  Final   Special Requests   Final    BOTTLES DRAWN AEROBIC ONLY Blood Culture adequate volume   Culture   Final    NO GROWTH 2 DAYS Performed at [facility], [place] [address], [place], [place] [address]    Report Status PENDING  Incomplete  Culture, blood (routine x 2)     Status: None (Preliminary result)   Collection Time: 12/13/17  5:05 AM  Result Value Ref Range Status   Specimen Description BLOOD RIGHT ANTECUBITAL  Final   Special Requests   Final    BOTTLES DRAWN AEROBIC ONLY Blood Culture adequate volume   Culture   Final    NO GROWTH 2 DAYS Performed at [facility], [place] [address], [place], [place] [address]    Report Status PENDING  Incomplete         Radiology Studies: [place] Ekg Site Rite  Result Date: 12/15/2017 If Site Rite image not attached, placement could not be confirmed due to current cardiac rhythm.       Scheduled Meds: . amLODipine  10 mg Oral Daily  . apixaban  5 mg Oral BID  . aspirin  81 mg Oral QHS  . atorvastatin  80 mg Oral QHS  . furosemide  40 mg Intravenous Daily  . insulin aspart  0-9 Units Subcutaneous TID WC  . insulin NPH Human  9 Units Subcutaneous QHS   And  . insulin NPH Human  12 Units  Subcutaneous QAC breakfast  . metoprolol tartrate  12.5 mg Oral BID  . multivitamin with minerals  1 tablet Oral Daily  . saccharomyces boulardii  250 mg Oral BID   Continuous Infusions: . vancomycin 750 mg (12/15/17 0949)     LOS: 4 days    Time spent: 35 minutes.     Hosie Poisson, MD [facility] Pager [phone]   If 7PM-7AM, please contact night-coverage [email or web address] Password [facility] 12/15/2017, 11:32 AM

## 2017-12-15 NOTE — Progress Notes (Signed)
[facility] will provide [facility] services for home IV ABX in partnership with [facility] who will provide [facility] services. Pt is already active with [facility] prior to this admission.  If patient discharges after hours, please call [phone].   Larry Sierras 12/15/2017, 4:31 PM

## 2017-12-15 NOTE — Progress Notes (Signed)
Patient returned from procedure at this time. VSS. No other distress noted or voice at this time. Will continue to monitor.  Annalyse Langlais, RN.

## 2017-12-15 NOTE — Progress Notes (Signed)
Peripherally Inserted Central Catheter/Midline Placement  The IV Nurse has discussed with the patient and/or persons authorized to consent for the patient, the purpose of this procedure and the potential benefits and risks involved with this procedure.  The benefits include less needle sticks, lab draws from the catheter, and the patient may be discharged home with the catheter. Risks include, but not limited to, infection, bleeding, blood clot (thrombus formation), and puncture of an artery; nerve damage and irregular heartbeat and possibility to perform a PICC exchange if needed/ordered by physician.  Alternatives to this procedure were also discussed.  Bard Power PICC patient education guide, fact sheet on infection prevention and patient information card has been provided to patient /or left at bedside.    PICC/Midline Placement Documentation  PICC Single Lumen 12/15/17 PICC Right Cephalic 40 cm (Active)  Indication for Insertion or Continuance of Line Home intravenous therapies (PICC only) 12/15/2017  5:00 PM  Site Assessment Clean;Dry;Intact 12/15/2017  5:00 PM  Line Status Flushed;Blood return noted 12/15/2017  5:00 PM  Dressing Type Transparent 12/15/2017  5:00 PM  Dressing Status Clean;Dry;Intact;Antimicrobial disc in place 12/15/2017  5:00 PM  Dressing Intervention New dressing 12/15/2017  5:00 PM  Dressing Change Due 12/22/17 12/15/2017  5:00 PM       Jule Economy Horton 12/15/2017, 5:58 PM

## 2017-12-15 NOTE — Interval H&P Note (Signed)
History and Physical Interval Note:  12/15/2017 7:36 AM  Kristina Summers  has presented today for surgery, with the diagnosis of BACTEREMIA  The various methods of treatment have been discussed with the patient and family. After consideration of risks, benefits and other options for treatment, the patient has consented to  Procedure(s): TRANSESOPHAGEAL ECHOCARDIOGRAM (TEE) (N/A) as a surgical intervention .  The patient's history has been reviewed, patient examined, no change in status, stable for surgery.  I have reviewed the patient's chart and labs.  Questions were answered to the patient's satisfaction.     Ceazia Harb [employer]

## 2017-12-15 NOTE — Progress Notes (Signed)
Inpatient Diabetes Program Recommendations  AACE/ADA: New Consensus Statement on Inpatient Glycemic Control (2019)  Target Ranges:  Prepandial:   less than 140 mg/dL      Peak postprandial:   less than 180 mg/dL (1-2 hours)      Critically ill patients:  140 - 180 mg/dL   Results for Kristina Summers, Kristina Summers (MRN [id number]) as of 12/15/2017 11:22  Ref. Range 12/14/2017 06:25 12/14/2017 11:49 12/14/2017 16:17 12/14/2017 21:29 12/15/2017 06:21  Glucose-Capillary Latest Ref Range: 70 - 99 mg/dL 243 (H) 276 (H) 270 (H) 269 (H) 225 (H)   Review of Glycemic Control  Diabetes history: DM2 Outpatient Diabetes medications: NPH 15 units QAM, NPH 10 units QPM, Regular 5 units TID with meals Current orders for Inpatient glycemic control: NPH 12 units QAM, NPH 9 units QHS, Novolog 0-9 units TID with meals  Inpatient Diabetes Program Recommendations: Insulin - Basal: Noted NPH was increased. Insulin - Meal Coverage: Post prandial glucose is consistently elevated. Please consider ordering Novolog 4 units TID with meals for meal coverage if patient eats at least 50% of meals.  Thanks, Barnie Alderman, RN, MSN, CDE Diabetes Coordinator Inpatient Diabetes Program [phone] (Team Pager from 8am to 5pm)

## 2017-12-15 NOTE — Progress Notes (Signed)
[facility]ious [facility]  Date of Admission:  12/11/2017     Total days of antibiotics 5         ASSESSMENT/PLAN  Kristina Summers has MRSA bacteremia in the setting of TAVR procedure about 1 month ago. TEE performed today with no evidence of vegetation, however concern remains for this newly placed valve with the possibility that a vegetation has yet to be formed. It does not appear that she has any other sources of infection presently and she continues to improve closer to her baseline. Kidney function remains stable with no evidence of nephrotoxicity.  Treating her for simple bacteremia would be a risk given her newly placed valve about 1 month ago. Will tentatively plan for at least 4 weeks of IV antibiotics with consideration for increasing to 6 weeks or possible transition to oral doxycycline.  She is not a candidate for rifampin as she requires anticoagulation with Eliquis.    1. Continue vancomycin dosed per pharmacy.  2. Will place order for PICC line.    Principal Problem:   MRSA bacteremia Active Problems:   S/P TAVR (transcatheter aortic valve replacement)   CAD (coronary artery disease)   Hyperlipidemia   S/P CABG (coronary artery bypass graft)   Fall   Sepsis ([place])   Essential hypertension   Type II diabetes mellitus with renal manifestations ([place])   History of CVA (cerebrovascular accident)   CKD (chronic kidney disease), stage III ([place])   Acute metabolic encephalopathy   Foot ulcer, right ([place])   . amLODipine  10 mg Oral Daily  . apixaban  5 mg Oral BID  . aspirin  81 mg Oral QHS  . atorvastatin  80 mg Oral QHS  . furosemide  40 mg Intravenous Daily  . insulin aspart  0-9 Units Subcutaneous TID WC  . insulin NPH Human  9 Units Subcutaneous QHS   And  . insulin NPH Human  12 Units Subcutaneous QAC breakfast  . metoprolol tartrate  12.5 mg Oral BID  . multivitamin with minerals  1 tablet Oral Daily  . saccharomyces boulardii  250 mg Oral BID     SUBJECTIVE:  Afebrile overnight. TEE performed with no evidence of endocarditis. Creatinine stable at 1.08.   Continues to feel well. No concerns overnight.   Allergies  Allergen Reactions  . Morphine And Related Nausea And Vomiting    Chest pain    . Lisinopril Cough  . Zoloft [Sertraline Hcl] Nausea Only          Review of Systems: Review of Systems  Constitutional: Negative for chills, diaphoresis and fever.  Respiratory: Negative for cough, sputum production, shortness of breath and wheezing.   Cardiovascular: Negative for chest pain.  Gastrointestinal: Negative for abdominal pain, constipation, diarrhea, nausea and vomiting.  Skin: Negative for rash.      OBJECTIVE: Vitals:   12/15/17 0800 12/15/17 0810 12/15/17 0820 12/15/17 0839  BP: (!) 143/49 (!) 154/47 (!) 157/51 138/65  Pulse: 82 85 79 76  Resp: 19 17 20 18   Temp:    97.8 F (36.6 C)  TempSrc:    Oral  SpO2: 97% 93% 97% 94%  Weight:      Height:       Body mass index is 29.86 kg/m.  Physical Exam  Constitutional: She is oriented to person, place, and time. She appears well-developed and well-nourished. No distress.  Seated in the chair; drowsy  Cardiovascular: Normal rate, regular rhythm, normal heart sounds and intact  distal pulses.  No murmur heard. Pulmonary/Chest: Effort normal and breath sounds normal. No stridor. No respiratory distress. She has no wheezes. She has no rales.  Neurological: She is alert and oriented to person, place, and time.  Skin: Skin is warm and dry.  Psychiatric: She has a normal mood and affect.    Lab Results Lab Results  Component Value Date   WBC 8.3 12/14/2017   HGB 8.4 (L) 12/14/2017   HCT 26.5 (L) 12/14/2017   MCV 90.1 12/14/2017   PLT 144 (L) 12/14/2017    Lab Results  Component Value Date   CREATININE 1.08 (H) 12/15/2017   BUN 30 (H) 12/15/2017   NA 132 (L) 12/15/2017   K 4.0 12/15/2017   CL 102 12/15/2017   CO2 25 12/15/2017    Lab Results   Component Value Date   ALT 20 12/11/2017   AST 37 12/11/2017   ALKPHOS 120 12/11/2017   BILITOT 1.0 12/11/2017     Microbiology: Recent Results (from the past 240 hour(s))  Blood Culture (routine x 2)     Status: Abnormal   Collection Time: 12/11/17  3:50 PM  Result Value Ref Range Status   Specimen Description BLOOD LEFT ANTECUBITAL  Final   Special Requests   Final    BOTTLES DRAWN AEROBIC AND ANAEROBIC Blood Culture adequate volume   Culture  Setup Time   Final    GRAM POSITIVE COCCI IN CLUSTERS IN BOTH AEROBIC AND ANAEROBIC BOTTLES CRITICAL RESULT CALLED TO, READ BACK BY AND VERIFIED WITH: PHARMD B MANCHERIE 12/12/17 AT 637 AM BY CM Performed at [facility], [place] [address], [place], [place] [address]    Culture METHICILLIN RESISTANT STAPHYLOCOCCUS AUREUS (A)  Final   Report Status 12/14/2017 FINAL  Final   Organism ID, Bacteria METHICILLIN RESISTANT STAPHYLOCOCCUS AUREUS  Final      Susceptibility   Methicillin resistant staphylococcus aureus - MIC*    CIPROFLOXACIN >=8 RESISTANT Resistant     ERYTHROMYCIN 1 INTERMEDIATE Intermediate     GENTAMICIN <=0.5 SENSITIVE Sensitive     OXACILLIN >=4 RESISTANT Resistant     TETRACYCLINE <=1 SENSITIVE Sensitive     VANCOMYCIN <=0.5 SENSITIVE Sensitive     TRIMETH/SULFA <=10 SENSITIVE Sensitive     CLINDAMYCIN <=0.25 SENSITIVE Sensitive     RIFAMPIN <=0.5 SENSITIVE Sensitive     Inducible Clindamycin NEGATIVE Sensitive     * METHICILLIN RESISTANT STAPHYLOCOCCUS AUREUS  Blood Culture ID Panel (Reflexed)     Status: Abnormal   Collection Time: 12/11/17  3:50 PM  Result Value Ref Range Status   Enterococcus species NOT DETECTED NOT DETECTED Final   Listeria monocytogenes NOT DETECTED NOT DETECTED Final   Staphylococcus species DETECTED (A) NOT DETECTED Final    Comment: CRITICAL RESULT CALLED TO, READ BACK BY AND VERIFIED WITH: PHARMD B MANCHERIE 12/12/17 AT 637 BY CM    Staphylococcus aureus DETECTED (A) NOT DETECTED Final     Comment: Methicillin (oxacillin)-resistant Staphylococcus aureus (MRSA). MRSA is predictably resistant to beta-lactam antibiotics (except ceftaroline). Preferred therapy is vancomycin unless clinically contraindicated. Patient requires contact precautions if  hospitalized. CRITICAL RESULT CALLED TO, READ BACK BY AND VERIFIED WITH: PHARMD B MANCHERIE 12/12/17 AT 637 AM BY CM    Methicillin resistance DETECTED (A) NOT DETECTED Final    Comment: CRITICAL RESULT CALLED TO, READ BACK BY AND VERIFIED WITH: PHARMD B MANCHERIE 12/12/17 AT 637 BY CM    Streptococcus species NOT DETECTED NOT DETECTED Final   Streptococcus  agalactiae NOT DETECTED NOT DETECTED Final   Streptococcus pneumoniae NOT DETECTED NOT DETECTED Final   Streptococcus pyogenes NOT DETECTED NOT DETECTED Final   Acinetobacter baumannii NOT DETECTED NOT DETECTED Final   Enterobacteriaceae species NOT DETECTED NOT DETECTED Final   Enterobacter cloacae complex NOT DETECTED NOT DETECTED Final   Escherichia coli NOT DETECTED NOT DETECTED Final   Klebsiella oxytoca NOT DETECTED NOT DETECTED Final   Klebsiella pneumoniae NOT DETECTED NOT DETECTED Final   Proteus species NOT DETECTED NOT DETECTED Final   Serratia marcescens NOT DETECTED NOT DETECTED Final   Haemophilus influenzae NOT DETECTED NOT DETECTED Final   Neisseria meningitidis NOT DETECTED NOT DETECTED Final   Pseudomonas aeruginosa NOT DETECTED NOT DETECTED Final   Candida albicans NOT DETECTED NOT DETECTED Final   Candida glabrata NOT DETECTED NOT DETECTED Final   Candida krusei NOT DETECTED NOT DETECTED Final   Candida parapsilosis NOT DETECTED NOT DETECTED Final   Candida tropicalis NOT DETECTED NOT DETECTED Final    Comment: Performed at [facility], [place] [address] [place], [place] [address]  Blood Culture (routine x 2)     Status: Abnormal   Collection Time: 12/11/17  4:52 PM  Result Value Ref Range Status   Specimen Description BLOOD RIGHT FOREARM  Final    Special Requests   Final    BOTTLES DRAWN AEROBIC AND ANAEROBIC Blood Culture adequate volume   Culture  Setup Time   Final    GRAM POSITIVE COCCI IN BOTH AEROBIC AND ANAEROBIC BOTTLES    Culture (A)  Final    STAPHYLOCOCCUS AUREUS SUSCEPTIBILITIES PERFORMED ON PREVIOUS CULTURE WITHIN THE LAST 5 DAYS. Performed at [facility], [place] [address], [place], [place] [address]    Report Status 12/14/2017 FINAL  Final  Urine culture     Status: Abnormal   Collection Time: 12/11/17  5:30 PM  Result Value Ref Range Status   Specimen Description URINE, RANDOM  Final   Special Requests   Final    NONE Performed at [facility], [place] [address], [place], [place] [address]    Culture >=100,000 COLONIES/mL ESCHERICHIA COLI (A)  Final   Report Status 12/13/2017 FINAL  Final   Organism ID, Bacteria ESCHERICHIA COLI (A)  Final      Susceptibility   Escherichia coli - MIC*    AMPICILLIN <=2 SENSITIVE Sensitive     CEFAZOLIN <=4 SENSITIVE Sensitive     CEFTRIAXONE <=1 SENSITIVE Sensitive     CIPROFLOXACIN <=0.25 SENSITIVE Sensitive     GENTAMICIN <=1 SENSITIVE Sensitive     IMIPENEM <=0.25 SENSITIVE Sensitive     NITROFURANTOIN <=16 SENSITIVE Sensitive     TRIMETH/SULFA <=20 SENSITIVE Sensitive     AMPICILLIN/SULBACTAM <=2 SENSITIVE Sensitive     PIP/TAZO <=4 SENSITIVE Sensitive     Extended ESBL NEGATIVE Sensitive     * >=100,000 COLONIES/mL ESCHERICHIA COLI  Culture, blood (routine x 2)     Status: None (Preliminary result)   Collection Time: 12/13/17  5:00 AM  Result Value Ref Range Status   Specimen Description BLOOD RIGHT ANTECUBITAL  Final   Special Requests   Final    BOTTLES DRAWN AEROBIC ONLY Blood Culture adequate volume   Culture   Final    NO GROWTH 2 DAYS Performed at [facility], [place] [address], [place], [place] [address]    Report Status PENDING  Incomplete  Culture, blood (routine x 2)     Status: None (Preliminary  result)   Collection Time: 12/13/17   5:05 AM  Result Value Ref Range Status   Specimen Description BLOOD RIGHT ANTECUBITAL  Final   Special Requests   Final    BOTTLES DRAWN AEROBIC ONLY Blood Culture adequate volume   Culture   Final    NO GROWTH 2 DAYS Performed at [facility], [address], [place], [place] [address]    Report Status PENDING  Incomplete     Terri Piedra, [place] [place] Pager  12/15/2017  10:36 AM

## 2017-12-15 NOTE — Progress Notes (Signed)
Occupational Therapy Treatment Patient Details Name: Kristina Summers MRN: [id number] DOB: 08/30/1935 Today's Date: 12/15/2017    History of present illness Kristina Summers is an 82 y/o female with previous medical history of Type 2 diabetes, peripheral vascular disease, paroxysmal atrial fibrillation, hypertension, diastolic heart failure, and most recently status post transcatheter aortic valve replacement on 11/08/2017 admitted with fevers, weakness, and confusion and found to have MRSA bacteremia.    OT comments  Pt progressing towards acute OT goals. Focus of session was grooming tasks standing at sink and functional mobility in the room. D/c plan has been updated to [place], 24 hr supervision.   Follow Up Recommendations  Home health OT;Supervision/Assistance - 24 hour    Equipment Recommendations       Recommendations for Other Services Other (comment)    Precautions / Restrictions Precautions Precautions: Fall;Other (comment) Precaution Comments: visual deficits at baseline Restrictions Weight Bearing Restrictions: No       Mobility Bed Mobility               General bed mobility comments: EOB at start of session in recliner at end of session  Transfers Overall transfer level: Needs assistance Equipment used: Rolling walker (2 wheeled) Transfers: Sit to/from Stand Sit to Stand: Min guard;Min assist         General transfer comment: steadying assist on initial stand    Balance Overall balance assessment: Needs assistance Sitting-balance support: No upper extremity supported;Feet supported Sitting balance-Leahy Scale: Fair Sitting balance - Comments: sitting EOB with BUE upon OT arrival   Standing balance support: Single extremity supported;During functional activity Standing balance-Leahy Scale: Poor Standing balance comment: external support (sink, rw) for balance during grooming tasks at sink                            ADL either performed or assessed  with clinical judgement   ADL Overall ADL's : Needs assistance/impaired     Grooming: Oral care;Wash/dry hands;Brushing hair;Min guard;Standing                               Functional mobility during ADLs: Min guard;Rolling walker General ADL Comments: Pt completed grooming task stannding at sink and functional mobility in the room with min guard asssist for safety     Vision       Perception     Praxis      Cognition Arousal/Alertness: Awake/alert Behavior During Therapy: WFL for tasks assessed/performed Overall Cognitive Status: Within Functional Limits for tasks assessed                                          Exercises     Shoulder Instructions       General Comments      Pertinent Vitals/ Pain       Pain Assessment: Faces Faces Pain Scale: No hurt  Home Living                                          Prior Functioning/Environment              Frequency  Min 2X/week        Progress Toward Goals  OT  Goals(current goals can now be found in the care plan section)  Progress towards OT goals: Progressing toward goals  Acute Rehab OT Goals Patient Stated Goal: to get better OT Goal Formulation: With patient Time For Goal Achievement: 12/26/17 Potential to Achieve Goals: Good ADL Goals Pt Will Perform Grooming: with supervision Pt Will Perform Upper Body Bathing: with supervision Pt Will Perform Lower Body Bathing: with min guard assist Pt Will Perform Upper Body Dressing: with min guard assist Pt Will Perform Lower Body Dressing: with min guard assist Pt Will Transfer to Toilet: with min guard assist Pt Will Perform Toileting - Clothing Manipulation and hygiene: with min guard assist  Plan Discharge plan needs to be updated    Co-evaluation                 AM-PAC PT "6 Clicks" Daily Activity     Outcome Measure   Help from another person eating meals?: None Help from another person  taking care of personal grooming?: A Little Help from another person toileting, which includes using toliet, bedpan, or urinal?: A Little Help from another person bathing (including washing, rinsing, drying)?: A Little Help from another person to put on and taking off regular upper body clothing?: A Little Help from another person to put on and taking off regular lower body clothing?: A Little 6 Click Score: 19    End of Session Equipment Utilized During Treatment: Rolling walker  OT Visit Diagnosis: Muscle weakness (generalized) (M62.81)   Activity Tolerance Patient limited by fatigue;Patient tolerated treatment well   Patient Left in chair;with call bell/phone within reach   Nurse Communication Mobility status        Time: 7121-9758 OT Time Calculation (min): 15 min  Charges: OT General Charges $OT Visit: 1 Visit OT Treatments $Self Care/Home Management : 8-22 mins     Hortencia Pilar 12/15/2017, 11:51 AM

## 2017-12-15 NOTE — CV Procedure (Signed)
Procedure: TEE  Indication: Endocarditis  Sedation: Versed 3 mg IV, Fentanyl 37.5 mcg.  11 min.   Findings: Please see echo section for full report.  Normal LV size and systolic function, EF 22%. There did appear to be septal bounce.  Normal RV size and systolic function.  Moderate left atrial enlargement.  Smoke but no discrete thrombus in the LA appendage.  Mild right atrial enlargement.  Small PFO seen by color doppler.  Trivial TR, no TV vegetation.  Trivial PR, no PV vegetation.  Bioprosthetic aortic valve s/p TAVR with mild perivalvular aortic insufficiency.  No stenosis with mean gradient 7 mmHg.  There was no vegetation on the aortic valve.  Mild to moderate MR, no MV vegetation.  Normal caliber thoracic aorta with grade III plaque.   Impression: No endocarditis.   Loralie Champagne 12/15/2017 7:54 AM

## 2017-12-16 ENCOUNTER — Encounter (HOSPITAL_COMMUNITY): Payer: Self-pay | Admitting: Cardiology

## 2017-12-16 DIAGNOSIS — Z452 Encounter for adjustment and management of vascular access device: Secondary | ICD-10-CM

## 2017-12-16 LAB — BASIC METABOLIC PANEL
ANION GAP: 5 (ref 5–15)
BUN: 30 mg/dL — ABNORMAL HIGH (ref 8–23)
CO2: 25 mmol/L (ref 22–32)
Calcium: 8.2 mg/dL — ABNORMAL LOW (ref 8.9–10.3)
Chloride: 102 mmol/L (ref 98–111)
Creatinine, Ser: 1.11 mg/dL — ABNORMAL HIGH (ref 0.44–1.00)
GFR calc Af Amer: 53 mL/min — ABNORMAL LOW (ref >60)
GFR, EST NON AFRICAN AMERICAN: 46 mL/min — AB (ref >60)
Glucose, Bld: 272 mg/dL — ABNORMAL HIGH (ref 70–99)
POTASSIUM: 3.7 mmol/L (ref 3.5–5.1)
SODIUM: 132 mmol/L — AB (ref 135–145)

## 2017-12-16 LAB — GLUCOSE, CAPILLARY
Glucose-Capillary: 244 mg/dL — ABNORMAL HIGH (ref 70–99)
Glucose-Capillary: 299 mg/dL — ABNORMAL HIGH (ref 70–99)

## 2017-12-16 MED ORDER — SENNOSIDES-DOCUSATE SODIUM 8.6-50 MG PO TABS: 1.0000 | ORAL_TABLET | Freq: Every evening | ORAL | 0 refills | Status: DC | PRN

## 2017-12-16 MED ORDER — VANCOMYCIN IV (FOR PTA / DISCHARGE USE ONLY): 750.0000 mg | Freq: Two times a day (BID) | INTRAVENOUS | 0 refills | Status: DC

## 2017-12-16 MED ORDER — SACCHAROMYCES BOULARDII 250 MG PO CAPS: 250.0000 mg | ORAL_CAPSULE | Freq: Two times a day (BID) | ORAL | 0 refills | Status: DC

## 2017-12-16 MED ORDER — HEPARIN SOD (PORK) LOCK FLUSH 100 UNIT/ML IV SOLN
250.0000 [IU] | INTRAVENOUS | Status: AC | PRN
  Administered 2017-12-16: 250 [IU]

## 2017-12-16 NOTE — Progress Notes (Signed)
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  Repeat cultures remain with no growth to date. PICC line inserted yesterday for continued IV therapy with vancomycin at home with supervision by family and home health.  Case discussed with Dr. Roxy Summers. Recommendation for 6 weeks of IV therapy as she is not a surgical candidate going forward and to protect her new valve. Kidney function is stable with no changes in creatinine. Home Health already established.   1. Continue vancomycin. 2. OPAT orders placed and listed below.   Diagnosis: MRSA bacteremia  Culture Result: MRSA  Allergies  Allergen Reactions  . Morphine And Related Nausea And Vomiting    Chest pain    . Lisinopril Cough  . Zoloft [Sertraline Hcl] Nausea Only         OPAT Orders Discharge antibiotics: Vancomycin Per pharmacy protocol  Aim for Vancomycin trough 15-20 (unless otherwise indicated) Duration:   6 weeks   End Date:  01/26/18  Kristina Summers Per Protocol:  Labs weekly while on IV antibiotics: _X_ CBC with differential _X_ BMP (biweekly) __ CMP __ CRP __ ESR _X_ Vancomycin trough  _X_ Please pull PIC at completion of IV antibiotics __ Please leave PIC in place until doctor has seen patient or been notified  Fax weekly labs to [phone]  Clinic Follow Up Appt:  4 weeks with Kristina Piedra, NP or Dr. Megan Summers   yPrincipal Problem:   MRSA bacteremia Active Problems:   S/P TAVR (transcatheter aortic valve replacement)   CAD (coronary artery disease)   Hyperlipidemia   S/P CABG (coronary artery bypass graft)   Fall   Sepsis [facility])   Essential hypertension   Type II diabetes mellitus with renal manifestations ([place])   History of CVA  (cerebrovascular accident)   CKD (chronic kidney disease), stage III ([place])   Acute metabolic encephalopathy   Foot ulcer, right ([place])   . amLODipine  10 mg Oral Daily  . apixaban  5 mg Oral BID  . aspirin  81 mg Oral QHS  . atorvastatin  80 mg Oral QHS  . furosemide  40 mg Oral Daily  . insulin aspart  0-9 Units Subcutaneous TID WC  . insulin aspart  2 Units Subcutaneous TID WC  . insulin NPH Human  9 Units Subcutaneous QHS   And  . insulin NPH Human  12 Units Subcutaneous QAC breakfast  . metoprolol tartrate  12.5 mg Oral BID  . multivitamin with minerals  1 tablet Oral Daily  . saccharomyces boulardii  250 mg Oral BID  . sodium chloride flush  10-40 mL Intracatheter Q12H    SUBJECTIVE:  Afebrile overnight.  Creatinine stable at 1.11.  PICC line inserted yesterday.  Plan for discharge to home with supervision by family and home health.    Allergies  Allergen Reactions  . Morphine And Related Nausea And Vomiting    Chest pain    . Lisinopril Cough  . Zoloft [Sertraline Hcl] Nausea Only          Review of Systems: Review of Systems  Constitutional: Negative for chills, diaphoresis, fever and malaise/fatigue.  Respiratory: Negative for cough, sputum production, shortness of breath and wheezing.   Cardiovascular: Negative for chest  pain and leg swelling.  Gastrointestinal: Negative for abdominal pain, constipation, diarrhea, nausea and vomiting.  Skin: Negative for rash.  Neurological: Negative for dizziness, weakness and headaches.      OBJECTIVE: Vitals:   12/15/17 2014 12/15/17 2306 12/16/17 0400 12/16/17 0805  BP: (!) 137/49 (!) 148/86 (!) 140/55 (!) 165/65  Pulse: 83 93 82 82  Resp: '17 18 18 16  '$ Temp: 98.7 F (37.1 C) 99.2 F (37.3 C) 98.7 F (37.1 C)   TempSrc: Oral Oral Oral   SpO2: 97% 96% 95% 94%  Weight:      Height:       Body mass index is 29.86 kg/m.  Physical Exam  Constitutional: She is oriented to person, place, and time. She appears  well-developed and well-nourished. No distress.  Cardiovascular: Normal rate, regular rhythm, normal heart sounds and intact distal pulses. Exam reveals no gallop and no friction rub.  No murmur heard. PICC line located in right arm. Site appears without infection and dressing is clean, dry and intact.   Pulmonary/Chest: Effort normal and breath sounds normal. No stridor. No respiratory distress. She has no wheezes. She has no rales. She exhibits no tenderness.  Abdominal: Soft. Bowel sounds are normal. She exhibits no distension.  Neurological: She is alert and oriented to person, place, and time.  Skin: Skin is warm and dry.  Psychiatric: She has a normal mood and affect.    Lab Results Lab Results  Component Value Date   WBC 8.3 12/14/2017   HGB 8.4 (L) 12/14/2017   HCT 26.5 (L) 12/14/2017   MCV 90.1 12/14/2017   PLT 144 (L) 12/14/2017    Lab Results  Component Value Date   CREATININE 1.11 (H) 12/16/2017   BUN 30 (H) 12/16/2017   NA 132 (L) 12/16/2017   K 3.7 12/16/2017   CL 102 12/16/2017   CO2 25 12/16/2017    Lab Results  Component Value Date   ALT 20 12/11/2017   AST 37 12/11/2017   ALKPHOS 120 12/11/2017   BILITOT 1.0 12/11/2017     Microbiology: Recent Results (from the past 240 hour(s))  Blood Culture (routine x 2)     Status: Abnormal   Collection Time: 12/11/17  3:50 PM  Result Value Ref Range Status   Specimen Description BLOOD LEFT ANTECUBITAL  Final   Special Requests   Final    BOTTLES DRAWN AEROBIC AND ANAEROBIC Blood Culture adequate volume   Culture  Setup Time   Final    GRAM POSITIVE COCCI IN CLUSTERS IN BOTH AEROBIC AND ANAEROBIC BOTTLES CRITICAL RESULT CALLED TO, READ BACK BY AND VERIFIED WITH: PHARMD B MANCHERIE 12/12/17 AT 637 AM BY CM Performed at [facility], [place] [address], [place], [place] [address]    Culture METHICILLIN RESISTANT STAPHYLOCOCCUS AUREUS (A)  Final   Report Status 12/14/2017 FINAL  Final   Organism ID, Bacteria  METHICILLIN RESISTANT STAPHYLOCOCCUS AUREUS  Final      Susceptibility   Methicillin resistant staphylococcus aureus - MIC*    CIPROFLOXACIN >=8 RESISTANT Resistant     ERYTHROMYCIN 1 INTERMEDIATE Intermediate     GENTAMICIN <=0.5 SENSITIVE Sensitive     OXACILLIN >=4 RESISTANT Resistant     TETRACYCLINE <=1 SENSITIVE Sensitive     VANCOMYCIN <=0.5 SENSITIVE Sensitive     TRIMETH/SULFA <=10 SENSITIVE Sensitive     CLINDAMYCIN <=0.25 SENSITIVE Sensitive     RIFAMPIN <=0.5 SENSITIVE Sensitive     Inducible Clindamycin NEGATIVE Sensitive     *  METHICILLIN RESISTANT STAPHYLOCOCCUS AUREUS  Blood Culture ID Panel (Reflexed)     Status: Abnormal   Collection Time: 12/11/17  3:50 PM  Result Value Ref Range Status   Enterococcus species NOT DETECTED NOT DETECTED Final   Listeria monocytogenes NOT DETECTED NOT DETECTED Final   Staphylococcus species DETECTED (A) NOT DETECTED Final    Comment: CRITICAL RESULT CALLED TO, READ BACK BY AND VERIFIED WITH: PHARMD B MANCHERIE 12/12/17 AT 637 BY CM    Staphylococcus aureus DETECTED (A) NOT DETECTED Final    Comment: Methicillin (oxacillin)-resistant Staphylococcus aureus (MRSA). MRSA is predictably resistant to beta-lactam antibiotics (except ceftaroline). Preferred therapy is vancomycin unless clinically contraindicated. Patient requires contact precautions if  hospitalized. CRITICAL RESULT CALLED TO, READ BACK BY AND VERIFIED WITH: PHARMD B MANCHERIE 12/12/17 AT 637 AM BY CM    Methicillin resistance DETECTED (A) NOT DETECTED Final    Comment: CRITICAL RESULT CALLED TO, READ BACK BY AND VERIFIED WITH: PHARMD B MANCHERIE 12/12/17 AT 637 BY CM    Streptococcus species NOT DETECTED NOT DETECTED Final   Streptococcus agalactiae NOT DETECTED NOT DETECTED Final   Streptococcus pneumoniae NOT DETECTED NOT DETECTED Final   Streptococcus pyogenes NOT DETECTED NOT DETECTED Final   Acinetobacter baumannii NOT DETECTED NOT DETECTED Final   Enterobacteriaceae  species NOT DETECTED NOT DETECTED Final   Enterobacter cloacae complex NOT DETECTED NOT DETECTED Final   Escherichia coli NOT DETECTED NOT DETECTED Final   Klebsiella oxytoca NOT DETECTED NOT DETECTED Final   Klebsiella pneumoniae NOT DETECTED NOT DETECTED Final   Proteus species NOT DETECTED NOT DETECTED Final   Serratia marcescens NOT DETECTED NOT DETECTED Final   Haemophilus influenzae NOT DETECTED NOT DETECTED Final   Neisseria meningitidis NOT DETECTED NOT DETECTED Final   Pseudomonas aeruginosa NOT DETECTED NOT DETECTED Final   Candida albicans NOT DETECTED NOT DETECTED Final   Candida glabrata NOT DETECTED NOT DETECTED Final   Candida krusei NOT DETECTED NOT DETECTED Final   Candida parapsilosis NOT DETECTED NOT DETECTED Final   Candida tropicalis NOT DETECTED NOT DETECTED Final    Comment: Performed at [facility], [place]. [address] [place], [place] [address]  Blood Culture (routine x 2)     Status: Abnormal   Collection Time: 12/11/17  4:52 PM  Result Value Ref Range Status   Specimen Description BLOOD RIGHT FOREARM  Final   Special Requests   Final    BOTTLES DRAWN AEROBIC AND ANAEROBIC Blood Culture adequate volume   Culture  Setup Time   Final    GRAM POSITIVE COCCI IN BOTH AEROBIC AND ANAEROBIC BOTTLES    Culture (A)  Final    STAPHYLOCOCCUS AUREUS SUSCEPTIBILITIES PERFORMED ON PREVIOUS CULTURE WITHIN THE LAST 5 DAYS. Performed at [facility], [place] [address], [place], [place] [address]    Report Status 12/14/2017 FINAL  Final  Urine culture     Status: Abnormal   Collection Time: 12/11/17  5:30 PM  Result Value Ref Range Status   Specimen Description URINE, RANDOM  Final   Special Requests   Final    Summers Performed at [facility], [place] [address] [place], [place] [address]    Culture >=100,000 COLONIES/mL ESCHERICHIA COLI (A)  Final   Report Status 12/13/2017 FINAL  Final   Organism ID, Bacteria ESCHERICHIA COLI (A)  Final      Susceptibility    Escherichia coli - MIC*    AMPICILLIN <=2 SENSITIVE Sensitive     CEFAZOLIN <=  4 SENSITIVE Sensitive     CEFTRIAXONE <=1 SENSITIVE Sensitive     CIPROFLOXACIN <=0.25 SENSITIVE Sensitive     GENTAMICIN <=1 SENSITIVE Sensitive     IMIPENEM <=0.25 SENSITIVE Sensitive     NITROFURANTOIN <=16 SENSITIVE Sensitive     TRIMETH/SULFA <=20 SENSITIVE Sensitive     AMPICILLIN/SULBACTAM <=2 SENSITIVE Sensitive     PIP/TAZO <=4 SENSITIVE Sensitive     Extended ESBL NEGATIVE Sensitive     * >=100,000 COLONIES/mL ESCHERICHIA COLI  Culture, blood (routine x 2)     Status: Summers (Preliminary result)   Collection Time: 12/13/17  5:00 AM  Result Value Ref Range Status   Specimen Description BLOOD RIGHT ANTECUBITAL  Final   Special Requests   Final    BOTTLES DRAWN AEROBIC ONLY Blood Culture adequate volume   Culture   Final    NO GROWTH 2 DAYS Performed at [facility], [place] [address], [place], [place] [address]    Report Status PENDING  Incomplete  Culture, blood (routine x 2)     Status: Summers (Preliminary result)   Collection Time: 12/13/17  5:05 AM  Result Value Ref Range Status   Specimen Description BLOOD RIGHT ANTECUBITAL  Final   Special Requests   Final    BOTTLES DRAWN AEROBIC ONLY Blood Culture adequate volume   Culture   Final    NO GROWTH 2 DAYS Performed at [facility], [place] [address], [place],  [address]    Report Status PENDING  Incomplete     Kristina Summers, [place] [facility] [phone] Pager  12/16/2017  9:02 AM

## 2017-12-16 NOTE — Progress Notes (Signed)
Results for LIZBETT, GARCIAGARCIA (MRN [id number]) as of 12/16/2017 10:14  Ref. Range 12/15/2017 06:21 12/15/2017 11:37 12/15/2017 16:51 12/15/2017 21:36 12/16/2017 06:54  Glucose-Capillary Latest Ref Range: 70 - 99 mg/dL 225 (H) 320 (H) 272 (H) 269 (H) 244 (H)  Noted that blood sugars continue to be elevated and greater than 200 mg/dl.  Recommend increasing NPH dosages to NPH 15 units at breakfast, 12 units at HS.  Continue Novolog SENSITIVE correction scale TID and continue Novolog meal coverage TID.  Will continue to monitor blood sugars while in the hospital.  Harvel Ricks RN BSN CDE Diabetes Coordinator Pager: [phone]  8am-5pm

## 2017-12-16 NOTE — Progress Notes (Addendum)
CM met with the patient yesterday and spoke to her son, Kristina Summers, over the phone. Plan is for patient to d/c home with 24 hours supervision from [place] and [facility] services continuing through [facility].  Kristina Summers states he is familiar with administering IV antibiotics at home and feels comfortable with the process. CM notified Pam with [facility] IV therapy and she called Kristina Summers to go over the process and plans for the IV med. CM reached out to patients daughter and went over the plan. She is in agreement and just wants a call when patient is ready for d/c. Will reach out to [facility] this am with plan.   1200 PM: Kristina Summers is at the bedside and patient is d/ced. Pam with [facility] IV therapy to see patient and Kristina Summers and then they will d/c home. Hoyle Sauer with [facility] aware of d/c and [place] orders. They will see the patient tomorrow for first home dose. Per Kristina Summers his sister is providing transport home.

## 2017-12-16 NOTE — Plan of Care (Signed)
  Problem: Education: Goal: Knowledge of General Education information will improve Description Including pain rating scale, medication(s)/side effects and non-pharmacologic comfort measures Outcome: Progressing Note:  POC reviewed with pt.   

## 2017-12-16 NOTE — Progress Notes (Signed)
PHARMACY CONSULT NOTE FOR:  OUTPATIENT  PARENTERAL ANTIBIOTIC THERAPY (OPAT)  Indication: MRSA bacteremia Regimen: Vancomycin 750 mg IV q12h End date: 01/26/18  IV antibiotic discharge orders are pended. To discharging provider:  please sign these orders via discharge navigator,  Select New Orders & click on the button choice - Manage This Unsigned Work.     Thank you for allowing pharmacy to be a part of this patient's care.   Brendolyn Patty, PharmD PGY1 Pharmacy Resident Phone [phone]  12/16/2017   9:51 AM

## 2017-12-16 NOTE — Discharge Summary (Signed)
Physician Discharge Summary  Kristina Summers UUV:[id number] DOB: [birth year] DOA: 12/11/2017  PCP: Care, [facility] date: 12/11/2017 Discharge date: 12/16/2017  Admitted From: Home.  Disposition:  Home.   Recommendations for Outpatient Follow-up:  1. Follow up with PCP in 1-2 weeks 2. Please obtain BMP/CBC in one week 3. Please follow up with ID as recommended.   Home Health:yes  Discharge Condition:stable.  CODE STATUS: full code.  Diet recommendation: Heart Healthy    Brief/Interim Summary: 82 year old woman admitted from home on 8/25 complaining of a fever, burning on urination and confusion. She normally lives alone and is independent for most of her ADLs. She was subsequently found to have an E. coli UTI and MRSA bacteremia.  Discharge Diagnoses:  Principal Problem:   MRSA bacteremia Active Problems:   CAD (coronary artery disease)   Hyperlipidemia   S/P CABG (coronary artery bypass graft)   Fall   Sepsis ([place])   Essential hypertension   Type II diabetes mellitus with renal manifestations ([place])   S/P TAVR (transcatheter aortic valve replacement)   History of CVA (cerebrovascular accident)   CKD (chronic kidney disease), stage III (HCC)   Acute metabolic encephalopathy   Foot ulcer, right ([place])  Sepsis from MRSA bacteremia and E coli UTI:  Resolved.  Plan for IV vancomycin as per ID recommendations.  PICC line placed.  Recommend outpatient follow u with ID as per their recommendations.     Acute metabolic encephalopathy  Resolved.    Stage 3 CKD; Creatinine at baseline.    Anemia of chronic disease:  Transfuse to keep hemoglobin greater than 7.  Currently its around 8.    CAD:  S/p CABG IN 2000.  Resume aspirin , statin and BB.    CHRONIC atrial fibrillation:  Rate controlled with BB, and on eliquis for anti coagulation.      Type 2 DM: CBG (last 3)  RecentLabs(last2labs)       Recent Labs    12/14/17 1617  12/14/17 2129 12/15/17 0621  GLUCAP 270* 269* 225*     Resume home meds on discharge.    Hypertension;  Well controlled.    Acute on chronic diastolic heart failure:  Echo showed LVEF of 55%.  Started on IV lasix for diuresis. Strict intake and output.  Good diuresis with IV lasix, transitioned to oral lasix on discharge.      Discharge Instructions  Discharge Instructions    Diet - low sodium heart healthy   Complete by:  As directed    Discharge instructions   Complete by:  As directed    Please follow up with Infectious Disease as recommended.  Please follow up with PCp in one week.   Home infusion instructions [facility] May follow [place] Dosing Protocol; May administer Cathflo as needed to maintain patency of vascular access device.; Flushing of vascular access device: per [facility] Protocol: 0.9% NaCl pre/post medica...   Complete by:  As directed    Instructions:  May follow [place] Dosing Protocol   Instructions:  May administer Cathflo as needed to maintain patency of vascular access device.   Instructions:  Flushing of vascular access device: per [facility] Protocol: 0.9% NaCl pre/post medication administration and prn patency; Heparin 100 u/ml, 30m for implanted ports and Heparin 10u/ml, 527mfor all other central venous catheters.   Instructions:  May follow [facility] Anaphylaxis Protocol for First Dose Administration in the home: 0.9% NaCl at 25-50 ml/hr to maintain IV access for protocol meds.  Epinephrine 0.3 ml IV/IM PRN and Benadryl 25-50 IV/IM PRN s/s of anaphylaxis.   Instructions:  [facility] Infusion Coordinator (RN) to assist per patient IV care needs in the home PRN.     Allergies as of 12/16/2017      Reactions   Morphine And Related Nausea And Vomiting   Chest pain    Lisinopril Cough   Zoloft [sertraline Hcl] Nausea Only         Medication List    STOP taking these medications   ciprofloxacin 250 MG tablet Commonly known as:   CIPRO   doxycycline 100 MG tablet Commonly known as:  VIBRA-TABS     TAKE these medications   acetaminophen 500 MG tablet Commonly known as:  TYLENOL Take 500 mg by mouth daily as needed for moderate pain or headache.   amLODipine 10 MG tablet Commonly known as:  NORVASC Take 1 tablet (10 mg total) by mouth daily. What changed:  when to take this   apixaban 5 MG Tabs tablet Commonly known as:  ELIQUIS Take 1 tablet (5 mg total) by mouth 2 (two) times daily.   aspirin 81 MG chewable tablet Chew 1 tablet (81 mg total) by mouth daily. What changed:  when to take this   atorvastatin 80 MG tablet Commonly known as:  LIPITOR Take 1 tablet (80 mg total) by mouth daily. What changed:  when to take this   furosemide 40 MG tablet Commonly known as:  LASIX Take 40 mg by mouth See admin instructions. Take one tablet (40 mg) by mouth twice daily - morning and afternoon   glucose 4 GM chewable tablet Chew 1-4 tablets by mouth once as needed for low blood sugar.   insulin NPH Human 100 UNIT/ML injection Commonly known as:  HUMULIN N,NOVOLIN N Inject 0.15 mLs (15 Units total) into the skin 2 (two) times daily. 15 units in the morning and 10 units in the PM What changed:    how much to take  when to take this  additional instructions   insulin regular 100 units/mL injection Commonly known as:  NOVOLIN R,HUMULIN R Inject 5 Units into the skin 3 (three) times daily after meals.   losartan 50 MG tablet Commonly known as:  COZAAR Take 1 tablet (50 mg total) by mouth daily.   metoprolol tartrate 25 MG tablet Commonly known as:  LOPRESSOR Take 0.5 tablets (12.5 mg total) by mouth 2 (two) times daily.   multivitamin with minerals Tabs tablet Take 1 tablet by mouth daily. One a Day 50 plus   nitroGLYCERIN 0.4 MG SL tablet Commonly known as:  NITROSTAT Place 0.4 mg under the tongue every 5 (five) minutes as needed for chest pain.   saccharomyces boulardii 250 MG  capsule Commonly known as:  FLORASTOR Take 1 capsule (250 mg total) by mouth 2 (two) times daily.   senna-docusate 8.6-50 MG tablet Commonly known as:  Senokot-S Take 1 tablet by mouth at bedtime as needed for mild constipation.   vancomycin  IVPB Inject 750 mg into the vein every 12 (twelve) hours. Indication:  MRSA bacteremia Last Day of Therapy:  01/26/2018 Labs - Sunday/Monday:  CBC/D, BMP, and vancomycin trough. Labs - Thursday:  BMP and vancomycin trough Labs - Every other week:  ESR and CRP            Home Infusion Instuctions  (From admission, onward)         Start     Ordered   12/16/17  0000  Home infusion instructions [facility] May follow [place] Dosing Protocol; May administer Cathflo as needed to maintain patency of vascular access device.; Flushing of vascular access device: per [facility] Protocol: 0.9% NaCl pre/post medica...    Question Answer Comment  Instructions May follow [place] Dosing Protocol   Instructions May administer Cathflo as needed to maintain patency of vascular access device.   Instructions Flushing of vascular access device: per [facility] Protocol: 0.9% NaCl pre/post medication administration and prn patency; Heparin 100 u/ml, 41m for implanted ports and Heparin 10u/ml, 566mfor all other central venous catheters.   Instructions May follow [facility] Anaphylaxis Protocol for First Dose Administration in the home: 0.9% NaCl at 25-50 ml/hr to maintain IV access for protocol meds. Epinephrine 0.3 ml IV/IM PRN and Benadryl 25-50 IV/IM PRN s/s of anaphylaxis.   Instructions [facility] Infusion Coordinator (RN) to assist per patient IV care needs in the home PRN.      12/16/17 0958         Follow-up Information    ThEileen StanfordPA-C Follow up on 12/28/2017.   Specialties:  Cardiology, Radiology Why:  @ 2:30pm Contact information: [address] [place] [phone]        Allergies  Allergen  Reactions  . Morphine And Related Nausea And Vomiting    Chest pain    . Lisinopril Cough  . Zoloft [Sertraline Hcl] Nausea Only         Consultations:  ID   Cardiology for TEE.    Procedures/Studies: Ct Head Wo Contrast  Result Date: 12/11/2017 CLINICAL DATA:  Patient fell. EXAM: CT HEAD WITHOUT CONTRAST TECHNIQUE: Contiguous axial images were obtained from the base of the skull through the vertex without intravenous contrast. COMPARISON:  July 08, 2017 FINDINGS: Brain: Evaluation mildly marred due to motion. Within this limitation, no subdural, epidural, or subarachnoid hemorrhage. No mass effect or midline shift. The left frontal parietal infarct is again identified. No acute cortical ischemia infarct noted. Cerebellum and brainstem are normal. Basal cisterns are normal. Vascular: Calcified atherosclerosis in the intracranial carotids. Skull: Normal. Negative for fracture or focal lesion. Sinuses/Orbits: No acute finding. Other: None. IMPRESSION: 1. No acute intracranial abnormalities noted. 2. Chronic left frontal parietal infarct. Electronically Signed   By: DaDorise BullionII M.D   On: 12/11/2017 18:32   Dg Chest Port 1 View  Result Date: 12/11/2017 CLINICAL DATA:  Fever. EXAM: PORTABLE CHEST 1 VIEW COMPARISON:  November 08, 2016 FINDINGS: Cardiomegaly. The hila and mediastinum are normal. Haziness/increased interstitial markings in the right apex and diffusely on the left. No focal infiltrate. No other acute abnormalities. IMPRESSION: Increased interstitial markings diffusely on the left and confined to the right apex. The findings could represent asymmetric edema or an infectious process. The findings are unchanged since November 08, 2017. Electronically Signed   By: DaDorise BullionII M.D   On: 12/11/2017 16:04   Dg Chest Port 1v Same Day  Result Date: 12/15/2017 CLINICAL DATA:  PICC placement EXAM: PORTABLE CHEST 1 VIEW COMPARISON:  12/11/2017 FINDINGS: Right PICC line is in place  with the tip at the cavoatrial junction. Prior median sternotomy, CABG and aortic valve repair. Mild interstitial prominence throughout the lungs could reflect early interstitial edema. No visible effusions or acute bony abnormality. IMPRESSION: Right PICC line tip at the cavoatrial junction. Cardiomegaly. Mild interstitial prominence, question early interstitial edema. Electronically Signed   By: KeRolm Baptise.D.  On: 12/15/2017 19:16   [place] Ekg Site Rite  Result Date: 12/15/2017 If Site Rite image not attached, placement could not be confirmed due to current cardiac rhythm.      Subjective: NO chest pain or sob.   Discharge Exam: Vitals:   12/16/17 0400 12/16/17 0805  BP: (!) 140/55 (!) 165/65  Pulse: 82 82  Resp: 18 16  Temp: 98.7 F (37.1 C)   SpO2: 95% 94%   Vitals:   12/15/17 2014 12/15/17 2306 12/16/17 0400 12/16/17 0805  BP: (!) 137/49 (!) 148/86 (!) 140/55 (!) 165/65  Pulse: 83 93 82 82  Resp: '17 18 18 16  '$ Temp: 98.7 F (37.1 C) 99.2 F (37.3 C) 98.7 F (37.1 C)   TempSrc: Oral Oral Oral   SpO2: 97% 96% 95% 94%  Weight:      Height:        General: Pt is alert, awake, not in acute distress Cardiovascular: RRR, S1/S2 +, no rubs, no gallops Respiratory: CTA bilaterally, no wheezing, no rhonchi Abdominal: Soft, NT, ND, bowel sounds + Extremities: no edema, no cyanosis    The results of significant diagnostics from this hospitalization (including imaging, microbiology, ancillary and laboratory) are listed below for reference.     Microbiology: Recent Results (from the past 240 hour(s))  Blood Culture (routine x 2)     Status: Abnormal   Collection Time: 12/11/17  3:50 PM  Result Value Ref Range Status   Specimen Description BLOOD LEFT ANTECUBITAL  Final   Special Requests   Final    BOTTLES DRAWN AEROBIC AND ANAEROBIC Blood Culture adequate volume   Culture  Setup Time   Final    GRAM POSITIVE COCCI IN CLUSTERS IN BOTH AEROBIC AND ANAEROBIC  BOTTLES CRITICAL RESULT CALLED TO, READ BACK BY AND VERIFIED WITH: PHARMD B MANCHERIE 12/12/17 AT 61 AM BY CM Performed at [facility], [place] [address], [place], [place] [address]    Culture METHICILLIN RESISTANT STAPHYLOCOCCUS AUREUS (A)  Final   Report Status 12/14/2017 FINAL  Final   Organism ID, Bacteria METHICILLIN RESISTANT STAPHYLOCOCCUS AUREUS  Final      Susceptibility   Methicillin resistant staphylococcus aureus - MIC*    CIPROFLOXACIN >=8 RESISTANT Resistant     ERYTHROMYCIN 1 INTERMEDIATE Intermediate     GENTAMICIN <=0.5 SENSITIVE Sensitive     OXACILLIN >=4 RESISTANT Resistant     TETRACYCLINE <=1 SENSITIVE Sensitive     VANCOMYCIN <=0.5 SENSITIVE Sensitive     TRIMETH/SULFA <=10 SENSITIVE Sensitive     CLINDAMYCIN <=0.25 SENSITIVE Sensitive     RIFAMPIN <=0.5 SENSITIVE Sensitive     Inducible Clindamycin NEGATIVE Sensitive     * METHICILLIN RESISTANT STAPHYLOCOCCUS AUREUS  Blood Culture ID Panel (Reflexed)     Status: Abnormal   Collection Time: 12/11/17  3:50 PM  Result Value Ref Range Status   Enterococcus species NOT DETECTED NOT DETECTED Final   Listeria monocytogenes NOT DETECTED NOT DETECTED Final   Staphylococcus species DETECTED (A) NOT DETECTED Final    Comment: CRITICAL RESULT CALLED TO, READ BACK BY AND VERIFIED WITH: PHARMD B MANCHERIE 12/12/17 AT 637 BY CM    Staphylococcus aureus DETECTED (A) NOT DETECTED Final    Comment: Methicillin (oxacillin)-resistant Staphylococcus aureus (MRSA). MRSA is predictably resistant to beta-lactam antibiotics (except ceftaroline). Preferred therapy is vancomycin unless clinically contraindicated. Patient requires contact precautions if  hospitalized. CRITICAL RESULT CALLED TO, READ BACK BY AND VERIFIED WITH: PHARMD B MANCHERIE 12/12/17 AT 637 AM BY  CM    Methicillin resistance DETECTED (A) NOT DETECTED Final    Comment: CRITICAL RESULT CALLED TO, READ BACK BY AND VERIFIED WITH: PHARMD B MANCHERIE 12/12/17 AT 637 BY  CM    Streptococcus species NOT DETECTED NOT DETECTED Final   Streptococcus agalactiae NOT DETECTED NOT DETECTED Final   Streptococcus pneumoniae NOT DETECTED NOT DETECTED Final   Streptococcus pyogenes NOT DETECTED NOT DETECTED Final   Acinetobacter baumannii NOT DETECTED NOT DETECTED Final   Enterobacteriaceae species NOT DETECTED NOT DETECTED Final   Enterobacter cloacae complex NOT DETECTED NOT DETECTED Final   Escherichia coli NOT DETECTED NOT DETECTED Final   Klebsiella oxytoca NOT DETECTED NOT DETECTED Final   Klebsiella pneumoniae NOT DETECTED NOT DETECTED Final   Proteus species NOT DETECTED NOT DETECTED Final   Serratia marcescens NOT DETECTED NOT DETECTED Final   Haemophilus influenzae NOT DETECTED NOT DETECTED Final   Neisseria meningitidis NOT DETECTED NOT DETECTED Final   Pseudomonas aeruginosa NOT DETECTED NOT DETECTED Final   Candida albicans NOT DETECTED NOT DETECTED Final   Candida glabrata NOT DETECTED NOT DETECTED Final   Candida krusei NOT DETECTED NOT DETECTED Final   Candida parapsilosis NOT DETECTED NOT DETECTED Final   Candida tropicalis NOT DETECTED NOT DETECTED Final    Comment: Performed at [facility], [place]. [address], [place], [place] [address]  Blood Culture (routine x 2)     Status: Abnormal   Collection Time: 12/11/17  4:52 PM  Result Value Ref Range Status   Specimen Description BLOOD RIGHT FOREARM  Final   Special Requests   Final    BOTTLES DRAWN AEROBIC AND ANAEROBIC Blood Culture adequate volume   Culture  Setup Time   Final    GRAM POSITIVE COCCI IN BOTH AEROBIC AND ANAEROBIC BOTTLES    Culture (A)  Final    STAPHYLOCOCCUS AUREUS SUSCEPTIBILITIES PERFORMED ON PREVIOUS CULTURE WITHIN THE LAST 5 DAYS. Performed at [facility], [place] [address] [place], [place] [address]    Report Status 12/14/2017 FINAL  Final  Urine culture     Status: Abnormal   Collection Time: 12/11/17  5:30 PM  Result Value Ref Range Status   Specimen  Description URINE, RANDOM  Final   Special Requests   Final    NONE Performed at [facility], [place] [address] [place],  [address]    Culture >=100,000 COLONIES/mL ESCHERICHIA COLI (A)  Final   Report Status 12/13/2017 FINAL  Final   Organism ID, Bacteria ESCHERICHIA COLI (A)  Final      Susceptibility   Escherichia coli - MIC*    AMPICILLIN <=2 SENSITIVE Sensitive     CEFAZOLIN <=4 SENSITIVE Sensitive     CEFTRIAXONE <=1 SENSITIVE Sensitive     CIPROFLOXACIN <=0.25 SENSITIVE Sensitive     GENTAMICIN <=1 SENSITIVE Sensitive     IMIPENEM <=0.25 SENSITIVE Sensitive     NITROFURANTOIN <=16 SENSITIVE Sensitive     TRIMETH/SULFA <=20 SENSITIVE Sensitive     AMPICILLIN/SULBACTAM <=2 SENSITIVE Sensitive     PIP/TAZO <=4 SENSITIVE Sensitive     Extended ESBL NEGATIVE Sensitive     * >=100,000 COLONIES/mL ESCHERICHIA COLI  Culture, blood (routine x 2)     Status: None (Preliminary result)   Collection Time: 12/13/17  5:00 AM  Result Value Ref Range Status   Specimen Description BLOOD RIGHT ANTECUBITAL  Final   Special Requests   Final    BOTTLES DRAWN AEROBIC ONLY Blood Culture adequate volume  Culture   Final    NO GROWTH 2 DAYS Performed at [facility], [place] [address], [place], [place] [address]    Report Status PENDING  Incomplete  Culture, blood (routine x 2)     Status: None (Preliminary result)   Collection Time: 12/13/17  5:05 AM  Result Value Ref Range Status   Specimen Description BLOOD RIGHT ANTECUBITAL  Final   Special Requests   Final    BOTTLES DRAWN AEROBIC ONLY Blood Culture adequate volume   Culture   Final    NO GROWTH 2 DAYS Performed at [facility], [place] [address], [place], [place] [address]    Report Status PENDING  Incomplete     Labs: BNP (last 3 results) Recent Labs    08/25/17 0323 11/04/17 0815 12/11/17 2132  BNP 432.3* 972.6* 0,321.2*   Basic Metabolic Panel: Recent Labs  Lab 12/12/17 0416 12/13/17 0506  12/14/17 0845 12/15/17 0314 12/16/17 0409  NA 132* 133* 133* 132* 132*  K 4.0 4.0 4.2 4.0 3.7  CL 102 104 103 102 102  CO2 '23 22 23 25 25  '$ GLUCOSE 322* 288* 356* 198* 272*  BUN 26* 35* 31* 30* 30*  CREATININE 1.29* 1.18* 1.02* 1.08* 1.11*  CALCIUM 8.0* 8.1* 8.1* 8.1* 8.2*  MG  --   --  2.2  --   --    Liver Function Tests: Recent Labs  Lab 12/11/17 1550  AST 37  ALT 20  ALKPHOS 120  BILITOT 1.0  PROT 7.8  ALBUMIN 2.9*   No results for input(s): LIPASE, AMYLASE in the last 168 hours. No results for input(s): AMMONIA in the last 168 hours. CBC: Recent Labs  Lab 12/11/17 1550 12/12/17 0416 12/13/17 0506 12/14/17 0845  WBC 11.0* 16.0* 10.6* 8.3  NEUTROABS 10.0*  --  8.8* 6.7  HGB 10.8* 9.3* 8.5* 8.4*  HCT 35.0* 30.1* 27.5* 26.5*  MCV 91.6 92.0 92.3 90.1  PLT 183 146* 139* 144*   Cardiac Enzymes: No results for input(s): CKTOTAL, CKMB, CKMBINDEX, TROPONINI in the last 168 hours. BNP: Invalid input(s): POCBNP CBG: Recent Labs  Lab 12/15/17 1137 12/15/17 1651 12/15/17 2136 12/16/17 0654 12/16/17 1133  GLUCAP 320* 272* 269* 244* 299*   D-Dimer No results for input(s): DDIMER in the last 72 hours. Hgb A1c No results for input(s): HGBA1C in the last 72 hours. Lipid Profile No results for input(s): CHOL, HDL, LDLCALC, TRIG, CHOLHDL, LDLDIRECT in the last 72 hours. Thyroid function studies No results for input(s): TSH, T4TOTAL, T3FREE, THYROIDAB in the last 72 hours.  Invalid input(s): FREET3 Anemia work up No results for input(s): VITAMINB12, FOLATE, FERRITIN, TIBC, IRON, RETICCTPCT in the last 72 hours. Urinalysis    Component Value Date/Time   COLORURINE YELLOW 12/11/2017 1741   APPEARANCEUR CLOUDY (A) 12/11/2017 1741   LABSPEC 1.015 12/11/2017 1741   PHURINE 5.0 12/11/2017 1741   GLUCOSEU >=500 (A) 12/11/2017 1741   HGBUR MODERATE (A) 12/11/2017 1741   BILIRUBINUR NEGATIVE 12/11/2017 1741   KETONESUR NEGATIVE 12/11/2017 1741   PROTEINUR >=300 (A)  12/11/2017 1741   UROBILINOGEN 1.0 05/29/2014 1052   NITRITE NEGATIVE 12/11/2017 1741   LEUKOCYTESUR LARGE (A) 12/11/2017 1741   Sepsis Labs Invalid input(s): PROCALCITONIN,  WBC,  LACTICIDVEN Microbiology Recent Results (from the past 240 hour(s))  Blood Culture (routine x 2)     Status: Abnormal   Collection Time: 12/11/17  3:50 PM  Result Value Ref Range Status   Specimen Description BLOOD LEFT ANTECUBITAL  Final   Special Requests   Final    BOTTLES DRAWN AEROBIC AND ANAEROBIC Blood Culture adequate volume   Culture  Setup Time   Final    GRAM POSITIVE COCCI IN CLUSTERS IN BOTH AEROBIC AND ANAEROBIC BOTTLES CRITICAL RESULT CALLED TO, READ BACK BY AND VERIFIED WITH: PHARMD B MANCHERIE 12/12/17 AT 42 AM BY CM Performed at [facility], [place] [address], [place], [place] [address]    Culture METHICILLIN RESISTANT STAPHYLOCOCCUS AUREUS (A)  Final   Report Status 12/14/2017 FINAL  Final   Organism ID, Bacteria METHICILLIN RESISTANT STAPHYLOCOCCUS AUREUS  Final      Susceptibility   Methicillin resistant staphylococcus aureus - MIC*    CIPROFLOXACIN >=8 RESISTANT Resistant     ERYTHROMYCIN 1 INTERMEDIATE Intermediate     GENTAMICIN <=0.5 SENSITIVE Sensitive     OXACILLIN >=4 RESISTANT Resistant     TETRACYCLINE <=1 SENSITIVE Sensitive     VANCOMYCIN <=0.5 SENSITIVE Sensitive     TRIMETH/SULFA <=10 SENSITIVE Sensitive     CLINDAMYCIN <=0.25 SENSITIVE Sensitive     RIFAMPIN <=0.5 SENSITIVE Sensitive     Inducible Clindamycin NEGATIVE Sensitive     * METHICILLIN RESISTANT STAPHYLOCOCCUS AUREUS  Blood Culture ID Panel (Reflexed)     Status: Abnormal   Collection Time: 12/11/17  3:50 PM  Result Value Ref Range Status   Enterococcus species NOT DETECTED NOT DETECTED Final   Listeria monocytogenes NOT DETECTED NOT DETECTED Final   Staphylococcus species DETECTED (A) NOT DETECTED Final    Comment: CRITICAL RESULT CALLED TO, READ BACK BY AND VERIFIED WITH: PHARMD B MANCHERIE  12/12/17 AT 637 BY CM    Staphylococcus aureus DETECTED (A) NOT DETECTED Final    Comment: Methicillin (oxacillin)-resistant Staphylococcus aureus (MRSA). MRSA is predictably resistant to beta-lactam antibiotics (except ceftaroline). Preferred therapy is vancomycin unless clinically contraindicated. Patient requires contact precautions if  hospitalized. CRITICAL RESULT CALLED TO, READ BACK BY AND VERIFIED WITH: PHARMD B MANCHERIE 12/12/17 AT 637 AM BY CM    Methicillin resistance DETECTED (A) NOT DETECTED Final    Comment: CRITICAL RESULT CALLED TO, READ BACK BY AND VERIFIED WITH: PHARMD B MANCHERIE 12/12/17 AT 637 BY CM    Streptococcus species NOT DETECTED NOT DETECTED Final   Streptococcus agalactiae NOT DETECTED NOT DETECTED Final   Streptococcus pneumoniae NOT DETECTED NOT DETECTED Final   Streptococcus pyogenes NOT DETECTED NOT DETECTED Final   Acinetobacter baumannii NOT DETECTED NOT DETECTED Final   Enterobacteriaceae species NOT DETECTED NOT DETECTED Final   Enterobacter cloacae complex NOT DETECTED NOT DETECTED Final   Escherichia coli NOT DETECTED NOT DETECTED Final   Klebsiella oxytoca NOT DETECTED NOT DETECTED Final   Klebsiella pneumoniae NOT DETECTED NOT DETECTED Final   Proteus species NOT DETECTED NOT DETECTED Final   Serratia marcescens NOT DETECTED NOT DETECTED Final   Haemophilus influenzae NOT DETECTED NOT DETECTED Final   Neisseria meningitidis NOT DETECTED NOT DETECTED Final   Pseudomonas aeruginosa NOT DETECTED NOT DETECTED Final   Candida albicans NOT DETECTED NOT DETECTED Final   Candida glabrata NOT DETECTED NOT DETECTED Final   Candida krusei NOT DETECTED NOT DETECTED Final   Candida parapsilosis NOT DETECTED NOT DETECTED Final   Candida tropicalis NOT DETECTED NOT DETECTED Final    Comment: Performed at [facility], [place]. [address] [place], [place] [address]  Blood Culture (routine x 2)     Status: Abnormal   Collection Time: 12/11/17  4:52 PM   Result Value Ref  Range Status   Specimen Description BLOOD RIGHT FOREARM  Final   Special Requests   Final    BOTTLES DRAWN AEROBIC AND ANAEROBIC Blood Culture adequate volume   Culture  Setup Time   Final    GRAM POSITIVE COCCI IN BOTH AEROBIC AND ANAEROBIC BOTTLES    Culture (A)  Final    STAPHYLOCOCCUS AUREUS SUSCEPTIBILITIES PERFORMED ON PREVIOUS CULTURE WITHIN THE LAST 5 DAYS. Performed at [facility], [place] [address] [place], [place] [address]    Report Status 12/14/2017 FINAL  Final  Urine culture     Status: Abnormal   Collection Time: 12/11/17  5:30 PM  Result Value Ref Range Status   Specimen Description URINE, RANDOM  Final   Special Requests   Final    NONE Performed at [facility], [place] [address], [place], [place] [address]    Culture >=100,000 COLONIES/mL ESCHERICHIA COLI (A)  Final   Report Status 12/13/2017 FINAL  Final   Organism ID, Bacteria ESCHERICHIA COLI (A)  Final      Susceptibility   Escherichia coli - MIC*    AMPICILLIN <=2 SENSITIVE Sensitive     CEFAZOLIN <=4 SENSITIVE Sensitive     CEFTRIAXONE <=1 SENSITIVE Sensitive     CIPROFLOXACIN <=0.25 SENSITIVE Sensitive     GENTAMICIN <=1 SENSITIVE Sensitive     IMIPENEM <=0.25 SENSITIVE Sensitive     NITROFURANTOIN <=16 SENSITIVE Sensitive     TRIMETH/SULFA <=20 SENSITIVE Sensitive     AMPICILLIN/SULBACTAM <=2 SENSITIVE Sensitive     PIP/TAZO <=4 SENSITIVE Sensitive     Extended ESBL NEGATIVE Sensitive     * >=100,000 COLONIES/mL ESCHERICHIA COLI  Culture, blood (routine x 2)     Status: None (Preliminary result)   Collection Time: 12/13/17  5:00 AM  Result Value Ref Range Status   Specimen Description BLOOD RIGHT ANTECUBITAL  Final   Special Requests   Final    BOTTLES DRAWN AEROBIC ONLY Blood Culture adequate volume   Culture   Final    NO GROWTH 2 DAYS Performed at [facility], [place] [address] [place], [place] [address]    Report Status PENDING  Incomplete  Culture,  blood (routine x 2)     Status: None (Preliminary result)   Collection Time: 12/13/17  5:05 AM  Result Value Ref Range Status   Specimen Description BLOOD RIGHT ANTECUBITAL  Final   Special Requests   Final    BOTTLES DRAWN AEROBIC ONLY Blood Culture adequate volume   Culture   Final    NO GROWTH 2 DAYS Performed at [facility], [place] [address] [place],  [address]    Report Status PENDING  Incomplete     Time coordinating discharge: 34 minutes  SIGNED:   Hosie Poisson, MD  [facility] 12/16/2017, 12:55 PM Pager   If 7PM-7AM, please contact night-coverage [email or web address] Password [email or web address]

## 2017-12-16 NOTE — Progress Notes (Signed)
Physical Therapy Treatment Patient Details Name: Kristina Summers MRN: [id number] DOB: [birth year] Today's Date: 12/16/2017    History of Present Illness Kristina Summers is an 82 y/o female with previous medical history of Type 2 diabetes, peripheral vascular disease, paroxysmal atrial fibrillation, hypertension, diastolic heart failure, and most recently status post transcatheter aortic valve replacement on 11/08/2017 admitted with fevers, weakness, and confusion and found to have MRSA bacteremia.     PT Comments    Patient is making progress toward PT goals and tolerated increased gait distance this session. Continue to progress as tolerated. Current plan remains appropriate.    Follow Up Recommendations  Supervision/Assistance - 24 hour;Home health PT     Equipment Recommendations  None recommended by PT    Recommendations for Other Services       Precautions / Restrictions Precautions Precautions: Fall;Other (comment) Precaution Comments: visual deficits at baseline Restrictions Weight Bearing Restrictions: No    Mobility  Bed Mobility Overal bed mobility: Modified Independent Bed Mobility: Supine to Sit           General bed mobility comments: increased time and effort  Transfers Overall transfer level: Needs assistance Equipment used: Rolling walker (2 wheeled) Transfers: Sit to/from Stand Sit to Stand: Min guard         General transfer comment: cues for safe hand placement  Ambulation/Gait Ambulation/Gait assistance: Supervision Gait Distance (Feet): 300 Feet Assistive device: Rolling walker (2 wheeled) Gait Pattern/deviations: Step-through pattern;Decreased stride length;Trunk flexed Gait velocity: decreased   General Gait Details: cues for posture, proximity to RW, and cadence   Stairs             Wheelchair Mobility    Modified Rankin (Stroke Patients Only)       Balance Overall balance assessment: Needs assistance Sitting-balance support: No  upper extremity supported;Feet supported Sitting balance-Leahy Scale: Good     Standing balance support: Single extremity supported;During functional activity Standing balance-Leahy Scale: Poor Standing balance comment: pt reliant on at least single UE support for dynamic activities                            Cognition Arousal/Alertness: Awake/alert Behavior During Therapy: WFL for tasks assessed/performed Overall Cognitive Status: Within Functional Limits for tasks assessed                                        Exercises      General Comments        Pertinent Vitals/Pain Pain Assessment: No/denies pain    Home Living                      Prior Function            PT Goals (current goals can now be found in the care plan section) Acute Rehab PT Goals PT Goal Formulation: With patient/family Time For Goal Achievement: 12/26/17 Potential to Achieve Goals: Fair Progress towards PT goals: Progressing toward goals    Frequency    Min 3X/week      PT Plan Current plan remains appropriate    Co-evaluation              AM-PAC PT "6 Clicks" Daily Activity  Outcome Measure  Difficulty turning over in bed (including adjusting bedclothes, sheets and blankets)?: None Difficulty moving from lying on back to sitting on  the side of the bed? : A Little Difficulty sitting down on and standing up from a chair with arms (e.g., wheelchair, bedside commode, etc,.)?: A Lot Help needed moving to and from a bed to chair (including a wheelchair)?: A Little Help needed walking in hospital room?: A Little Help needed climbing 3-5 steps with a railing? : A Little 6 Click Score: 18    End of Session Equipment Utilized During Treatment: Gait belt Activity Tolerance: Patient tolerated treatment well Patient left: with chair alarm set;with call bell/phone within reach;in chair Nurse Communication: Mobility status PT Visit Diagnosis: Other  abnormalities of gait and mobility (R26.89);Muscle weakness (generalized) (M62.81)     Time: 3794-3276 PT Time Calculation (min) (ACUTE ONLY): 20 min  Charges:  $Gait Training: 8-22 mins                     Earney Navy, PTA Pager: [phone]     Darliss Cheney 12/16/2017, 9:10 AM

## 2017-12-16 NOTE — Progress Notes (Signed)
Pt discharge education and instructions completed with pt and family at bedside; all voices understanding and denies any questions. Pt PICC capped by IV team per protocol. Pt telemetry removed; and pt to pick up electronically sent prescriptions from preferred pharmacy on file. Pt transported off unit via wheelchair with belongings to the side my family. Pt discharge home with family to transport her home. Delia Heady RN

## 2017-12-18 LAB — CULTURE, BLOOD (ROUTINE X 2)
CULTURE: NO GROWTH
Culture: NO GROWTH
Special Requests: ADEQUATE
Special Requests: ADEQUATE

## 2017-12-20 ENCOUNTER — Encounter: Payer: Self-pay | Admitting: Internal Medicine

## 2017-12-21 ENCOUNTER — Telehealth: Payer: Self-pay | Admitting: *Deleted

## 2017-12-21 NOTE — Telephone Encounter (Signed)
Lin with [facility] called to advise the patient recent labs had alert values. 12/20/17 RBC 2.36, Hemo 6.6, Hematocrit 20.7. Advised will let the provider know.

## 2017-12-22 ENCOUNTER — Other Ambulatory Visit: Payer: Self-pay

## 2017-12-22 ENCOUNTER — Encounter: Payer: Self-pay | Admitting: Internal Medicine

## 2017-12-22 ENCOUNTER — Inpatient Hospital Stay (HOSPITAL_COMMUNITY)
Admission: EM | Admit: 2017-12-22 | Discharge: 2017-12-26 | DRG: 378 | Disposition: A | Discharge status: Home Health | Payer: Medicare Other | Attending: Internal Medicine | Admitting: Internal Medicine

## 2017-12-22 ENCOUNTER — Telehealth: Payer: Self-pay | Admitting: *Deleted

## 2017-12-22 ENCOUNTER — Encounter (HOSPITAL_COMMUNITY): Payer: Self-pay | Admitting: Emergency Medicine

## 2017-12-22 DIAGNOSIS — E785 Hyperlipidemia, unspecified: Secondary | ICD-10-CM | POA: Diagnosis present

## 2017-12-22 DIAGNOSIS — R7881 Bacteremia: Secondary | ICD-10-CM | POA: Diagnosis present

## 2017-12-22 DIAGNOSIS — D62 Acute posthemorrhagic anemia: Secondary | ICD-10-CM | POA: Diagnosis present

## 2017-12-22 DIAGNOSIS — N183 Chronic kidney disease, stage 3 (moderate): Secondary | ICD-10-CM | POA: Diagnosis present

## 2017-12-22 DIAGNOSIS — E1122 Type 2 diabetes mellitus with diabetic chronic kidney disease: Secondary | ICD-10-CM | POA: Diagnosis present

## 2017-12-22 DIAGNOSIS — I130 Hypertensive heart and chronic kidney disease with heart failure and stage 1 through stage 4 chronic kidney disease, or unspecified chronic kidney disease: Secondary | ICD-10-CM | POA: Diagnosis present

## 2017-12-22 DIAGNOSIS — I350 Nonrheumatic aortic (valve) stenosis: Secondary | ICD-10-CM | POA: Diagnosis present

## 2017-12-22 DIAGNOSIS — N184 Chronic kidney disease, stage 4 (severe): Secondary | ICD-10-CM | POA: Diagnosis present

## 2017-12-22 DIAGNOSIS — Z951 Presence of aortocoronary bypass graft: Secondary | ICD-10-CM

## 2017-12-22 DIAGNOSIS — I739 Peripheral vascular disease, unspecified: Secondary | ICD-10-CM | POA: Diagnosis present

## 2017-12-22 DIAGNOSIS — Z792 Long term (current) use of antibiotics: Secondary | ICD-10-CM

## 2017-12-22 DIAGNOSIS — K449 Diaphragmatic hernia without obstruction or gangrene: Secondary | ICD-10-CM | POA: Diagnosis present

## 2017-12-22 DIAGNOSIS — Z885 Allergy status to narcotic agent status: Secondary | ICD-10-CM

## 2017-12-22 DIAGNOSIS — K922 Gastrointestinal hemorrhage, unspecified: Secondary | ICD-10-CM | POA: Diagnosis present

## 2017-12-22 DIAGNOSIS — E1129 Type 2 diabetes mellitus with other diabetic kidney complication: Secondary | ICD-10-CM | POA: Diagnosis present

## 2017-12-22 DIAGNOSIS — D649 Anemia, unspecified: Secondary | ICD-10-CM

## 2017-12-22 DIAGNOSIS — K219 Gastro-esophageal reflux disease without esophagitis: Secondary | ICD-10-CM | POA: Diagnosis present

## 2017-12-22 DIAGNOSIS — I2510 Atherosclerotic heart disease of native coronary artery without angina pectoris: Secondary | ICD-10-CM | POA: Diagnosis present

## 2017-12-22 DIAGNOSIS — I481 Persistent atrial fibrillation: Secondary | ICD-10-CM | POA: Diagnosis not present

## 2017-12-22 DIAGNOSIS — E669 Obesity, unspecified: Secondary | ICD-10-CM | POA: Diagnosis present

## 2017-12-22 DIAGNOSIS — I69328 Other speech and language deficits following cerebral infarction: Secondary | ICD-10-CM | POA: Diagnosis not present

## 2017-12-22 DIAGNOSIS — E1151 Type 2 diabetes mellitus with diabetic peripheral angiopathy without gangrene: Secondary | ICD-10-CM | POA: Diagnosis present

## 2017-12-22 DIAGNOSIS — Z8719 Personal history of other diseases of the digestive system: Secondary | ICD-10-CM

## 2017-12-22 DIAGNOSIS — N390 Urinary tract infection, site not specified: Secondary | ICD-10-CM | POA: Diagnosis present

## 2017-12-22 DIAGNOSIS — I482 Chronic atrial fibrillation, unspecified: Secondary | ICD-10-CM | POA: Diagnosis present

## 2017-12-22 DIAGNOSIS — Z87891 Personal history of nicotine dependence: Secondary | ICD-10-CM

## 2017-12-22 DIAGNOSIS — I6529 Occlusion and stenosis of unspecified carotid artery: Secondary | ICD-10-CM | POA: Diagnosis present

## 2017-12-22 DIAGNOSIS — M797 Fibromyalgia: Secondary | ICD-10-CM | POA: Diagnosis present

## 2017-12-22 DIAGNOSIS — Z952 Presence of prosthetic heart valve: Secondary | ICD-10-CM

## 2017-12-22 DIAGNOSIS — R402140 Coma scale, eyes open, spontaneous, unspecified time: Secondary | ICD-10-CM | POA: Diagnosis present

## 2017-12-22 DIAGNOSIS — R402360 Coma scale, best motor response, obeys commands, unspecified time: Secondary | ICD-10-CM | POA: Diagnosis present

## 2017-12-22 DIAGNOSIS — B9562 Methicillin resistant Staphylococcus aureus infection as the cause of diseases classified elsewhere: Secondary | ICD-10-CM | POA: Diagnosis present

## 2017-12-22 DIAGNOSIS — Z953 Presence of xenogenic heart valve: Secondary | ICD-10-CM

## 2017-12-22 DIAGNOSIS — R402250 Coma scale, best verbal response, oriented, unspecified time: Secondary | ICD-10-CM | POA: Diagnosis present

## 2017-12-22 DIAGNOSIS — Z888 Allergy status to other drugs, medicaments and biological substances status: Secondary | ICD-10-CM

## 2017-12-22 DIAGNOSIS — I5032 Chronic diastolic (congestive) heart failure: Secondary | ICD-10-CM | POA: Diagnosis present

## 2017-12-22 DIAGNOSIS — Z79899 Other long term (current) drug therapy: Secondary | ICD-10-CM

## 2017-12-22 DIAGNOSIS — Z794 Long term (current) use of insulin: Secondary | ICD-10-CM

## 2017-12-22 DIAGNOSIS — Z7982 Long term (current) use of aspirin: Secondary | ICD-10-CM

## 2017-12-22 DIAGNOSIS — Z95828 Presence of other vascular implants and grafts: Secondary | ICD-10-CM

## 2017-12-22 DIAGNOSIS — Z7901 Long term (current) use of anticoagulants: Secondary | ICD-10-CM

## 2017-12-22 DIAGNOSIS — Z95820 Peripheral vascular angioplasty status with implants and grafts: Secondary | ICD-10-CM

## 2017-12-22 DIAGNOSIS — Z6831 Body mass index (BMI) 31.0-31.9, adult: Secondary | ICD-10-CM

## 2017-12-22 LAB — CBC WITH DIFFERENTIAL/PLATELET
ABS IMMATURE GRANULOCYTES: 0 10*3/uL (ref 0.0–0.1)
BASOS ABS: 0.1 10*3/uL (ref 0.0–0.1)
Basophils Relative: 1 %
Eosinophils Absolute: 0.3 10*3/uL (ref 0.0–0.7)
Eosinophils Relative: 4 %
HCT: 22 % — ABNORMAL LOW (ref 36.0–46.0)
HEMOGLOBIN: 6.7 g/dL — AB (ref 12.0–15.0)
IMMATURE GRANULOCYTES: 1 %
LYMPHS PCT: 15 %
Lymphs Abs: 1.1 10*3/uL (ref 0.7–4.0)
MCH: 27.9 pg (ref 26.0–34.0)
MCHC: 30.5 g/dL (ref 30.0–36.0)
MCV: 91.7 fL (ref 78.0–100.0)
MONO ABS: 0.5 10*3/uL (ref 0.1–1.0)
Monocytes Relative: 7 %
NEUTROS ABS: 5.5 10*3/uL (ref 1.7–7.7)
Neutrophils Relative %: 72 %
Platelets: 309 10*3/uL (ref 150–400)
RBC: 2.4 MIL/uL — AB (ref 3.87–5.11)
RDW: 15.4 % (ref 11.5–15.5)
WBC: 7.5 10*3/uL (ref 4.0–10.5)

## 2017-12-22 LAB — CBG MONITORING, ED: GLUCOSE-CAPILLARY: 261 mg/dL — AB (ref 70–99)

## 2017-12-22 LAB — COMPREHENSIVE METABOLIC PANEL
ALBUMIN: 2.3 g/dL — AB (ref 3.5–5.0)
ALT: 25 U/L (ref 0–44)
AST: 29 U/L (ref 15–41)
Alkaline Phosphatase: 114 U/L (ref 38–126)
Anion gap: 11 (ref 5–15)
BILIRUBIN TOTAL: 0.7 mg/dL (ref 0.3–1.2)
BUN: 25 mg/dL — AB (ref 8–23)
CHLORIDE: 101 mmol/L (ref 98–111)
CO2: 24 mmol/L (ref 22–32)
CREATININE: 1.32 mg/dL — AB (ref 0.44–1.00)
Calcium: 9.5 mg/dL (ref 8.9–10.3)
GFR calc Af Amer: 43 mL/min — ABNORMAL LOW (ref >60)
GFR, EST NON AFRICAN AMERICAN: 37 mL/min — AB (ref >60)
GLUCOSE: 276 mg/dL — AB (ref 70–99)
Potassium: 4.2 mmol/L (ref 3.5–5.1)
SODIUM: 136 mmol/L (ref 135–145)
Total Protein: 6.9 g/dL (ref 6.5–8.1)

## 2017-12-22 LAB — LACTATE DEHYDROGENASE: LDH: 216 U/L — ABNORMAL HIGH (ref 98–192)

## 2017-12-22 LAB — PREPARE RBC (CROSSMATCH)

## 2017-12-22 LAB — PROTIME-INR
INR: 1.76
PROTHROMBIN TIME: 20.4 s — AB (ref 11.4–15.2)

## 2017-12-22 LAB — POC OCCULT BLOOD, ED: FECAL OCCULT BLD: POSITIVE — AB

## 2017-12-22 LAB — I-STAT TROPONIN, ED: Troponin i, poc: 0.04 ng/mL (ref 0.00–0.08)

## 2017-12-22 LAB — VANCOMYCIN, RANDOM: Vancomycin Rm: 23

## 2017-12-22 MED ORDER — LOSARTAN POTASSIUM 50 MG PO TABS
50.0000 mg | ORAL_TABLET | Freq: Every day | ORAL | Status: DC
  Administered 2017-12-23 – 2017-12-26 (×4): 50 mg via ORAL
  Filled 2017-12-22 (×4): qty 1

## 2017-12-22 MED ORDER — ACETAMINOPHEN 650 MG RE SUPP: 650.0000 mg | Freq: Four times a day (QID) | RECTAL | Status: DC | PRN

## 2017-12-22 MED ORDER — SODIUM CHLORIDE 0.9 % IV BOLUS
500.0000 mL | Freq: Once | INTRAVENOUS | Status: AC
  Administered 2017-12-22: 500 mL via INTRAVENOUS

## 2017-12-22 MED ORDER — SODIUM CHLORIDE 0.9% FLUSH
3.0000 mL | Freq: Two times a day (BID) | INTRAVENOUS | Status: DC
  Administered 2017-12-22 – 2017-12-26 (×5): 3 mL via INTRAVENOUS

## 2017-12-22 MED ORDER — METOPROLOL TARTRATE 12.5 MG HALF TABLET
12.5000 mg | ORAL_TABLET | Freq: Two times a day (BID) | ORAL | Status: DC
  Administered 2017-12-22 – 2017-12-26 (×8): 12.5 mg via ORAL
  Filled 2017-12-22 (×8): qty 1

## 2017-12-22 MED ORDER — NITROGLYCERIN 0.4 MG SL SUBL: 0.4000 mg | SUBLINGUAL_TABLET | SUBLINGUAL | Status: DC | PRN

## 2017-12-22 MED ORDER — ADULT MULTIVITAMIN W/MINERALS CH
1.0000 | ORAL_TABLET | Freq: Every day | ORAL | Status: DC
  Administered 2017-12-23 – 2017-12-26 (×4): 1 via ORAL
  Filled 2017-12-22 (×4): qty 1

## 2017-12-22 MED ORDER — SODIUM CHLORIDE 0.9% FLUSH
10.0000 mL | INTRAVENOUS | Status: DC | PRN
  Administered 2017-12-23: 10 mL
  Filled 2017-12-22: qty 40

## 2017-12-22 MED ORDER — SODIUM CHLORIDE 0.9% FLUSH: 3.0000 mL | INTRAVENOUS | Status: DC | PRN

## 2017-12-22 MED ORDER — INSULIN ASPART 100 UNIT/ML ~~LOC~~ SOLN
0.0000 [IU] | Freq: Every day | SUBCUTANEOUS | Status: DC
  Administered 2017-12-22 – 2017-12-25 (×2): 2 [IU] via SUBCUTANEOUS

## 2017-12-22 MED ORDER — ONDANSETRON HCL 4 MG/2ML IJ SOLN: 4.0000 mg | Freq: Four times a day (QID) | INTRAMUSCULAR | Status: DC | PRN

## 2017-12-22 MED ORDER — VANCOMYCIN IV (FOR PTA / DISCHARGE USE ONLY): 1000.0000 mg | INTRAVENOUS | Status: DC

## 2017-12-22 MED ORDER — INSULIN ASPART 100 UNIT/ML ~~LOC~~ SOLN
0.0000 [IU] | Freq: Three times a day (TID) | SUBCUTANEOUS | Status: DC
  Administered 2017-12-23: 3 [IU] via SUBCUTANEOUS
  Administered 2017-12-23 (×2): 2 [IU] via SUBCUTANEOUS
  Administered 2017-12-24 – 2017-12-25 (×2): 1 [IU] via SUBCUTANEOUS

## 2017-12-22 MED ORDER — PANTOPRAZOLE SODIUM 40 MG PO TBEC
40.0000 mg | DELAYED_RELEASE_TABLET | Freq: Two times a day (BID) | ORAL | Status: DC
  Administered 2017-12-22 – 2017-12-26 (×8): 40 mg via ORAL
  Filled 2017-12-22 (×8): qty 1

## 2017-12-22 MED ORDER — ASPIRIN 81 MG PO CHEW
81.0000 mg | CHEWABLE_TABLET | Freq: Every day | ORAL | Status: DC
  Administered 2017-12-22 – 2017-12-25 (×4): 81 mg via ORAL
  Filled 2017-12-22 (×5): qty 1

## 2017-12-22 MED ORDER — SENNOSIDES-DOCUSATE SODIUM 8.6-50 MG PO TABS: 1.0000 | ORAL_TABLET | Freq: Every evening | ORAL | Status: DC | PRN

## 2017-12-22 MED ORDER — SACCHAROMYCES BOULARDII 250 MG PO CAPS
250.0000 mg | ORAL_CAPSULE | Freq: Two times a day (BID) | ORAL | Status: DC
  Administered 2017-12-22 – 2017-12-26 (×8): 250 mg via ORAL
  Filled 2017-12-22 (×8): qty 1

## 2017-12-22 MED ORDER — ONDANSETRON HCL 4 MG PO TABS: 4.0000 mg | ORAL_TABLET | Freq: Four times a day (QID) | ORAL | Status: DC | PRN

## 2017-12-22 MED ORDER — AMLODIPINE BESYLATE 10 MG PO TABS
10.0000 mg | ORAL_TABLET | Freq: Every day | ORAL | Status: DC
  Administered 2017-12-23 – 2017-12-26 (×4): 10 mg via ORAL
  Filled 2017-12-22 (×4): qty 1

## 2017-12-22 MED ORDER — VANCOMYCIN HCL IN DEXTROSE 1-5 GM/200ML-% IV SOLN
1000.0000 mg | INTRAVENOUS | Status: DC
  Administered 2017-12-23 – 2017-12-26 (×4): 1000 mg via INTRAVENOUS
  Filled 2017-12-22 (×4): qty 200

## 2017-12-22 MED ORDER — ACETAMINOPHEN 325 MG PO TABS: 650.0000 mg | ORAL_TABLET | Freq: Four times a day (QID) | ORAL | Status: DC | PRN

## 2017-12-22 MED ORDER — ATORVASTATIN CALCIUM 80 MG PO TABS
80.0000 mg | ORAL_TABLET | Freq: Every day | ORAL | Status: DC
  Administered 2017-12-22 – 2017-12-25 (×4): 80 mg via ORAL
  Filled 2017-12-22 (×5): qty 1

## 2017-12-22 MED ORDER — SODIUM CHLORIDE 0.9 % IV SOLN: 250.0000 mL | INTRAVENOUS | Status: DC | PRN

## 2017-12-22 MED ORDER — INSULIN NPH (HUMAN) (ISOPHANE) 100 UNIT/ML ~~LOC~~ SUSP
15.0000 [IU] | Freq: Two times a day (BID) | SUBCUTANEOUS | Status: DC
  Administered 2017-12-22 – 2017-12-26 (×7): 15 [IU] via SUBCUTANEOUS
  Filled 2017-12-22: qty 10

## 2017-12-22 MED ORDER — HEPARIN (PORCINE) IN NACL 100-0.45 UNIT/ML-% IJ SOLN
1200.0000 [IU]/h | INTRAMUSCULAR | Status: DC
  Administered 2017-12-22: 1200 [IU]/h via INTRAVENOUS
  Filled 2017-12-22: qty 250

## 2017-12-22 NOTE — Telephone Encounter (Signed)
error 

## 2017-12-22 NOTE — ED Triage Notes (Signed)
Pt saw her PCP today. [facility]. Pt was told she had a blood level of 6. Pt is not sure which lab.  Pt states MD said level was half of what it should be.  Pt c/o weakness, dizziness and SOB. NO c/o bleeding or dark tarry stool. A&Ox4. Pt in wheel chair. Pt has Picc Line in upper R arm. Pt states it is for MRSA treatment.

## 2017-12-22 NOTE — H&P (Addendum)
History and Physical    Kristina Summers BJY:[id number] DOB: 06/25/1935 DOA: 12/22/2017  PCP: [facility]   Patient coming from: home    Chief Complaint: abnormal labs, anemia  HPI: Kristina Summers is a 82 y.o. female with medical history significant of coronary artery disease status post CABG in 2000 with left heart catheterization on 10/11/2017 showing most of CABGs patient, history of CVA, gated by slowed speech, critical aortic stenosis status post TAVR on 11/08/2017, hypertension, hyperlipidemia, peripheral vascular disease, diastolic heart failure, paroxysmal atrial fibrillation on apixaban, stage III CKD, type 2 diabetes who was recently admitted on 12/11/2017 and discharged on 12/16/2017 with MRSA bacteremia on IV vancomycin for 6 weeks who presents because her weekly lab draws were noted to be abnormal with anemia.  Patient herself denies noticing any GI bleeding or melena.  She reports occasionally being slightly lightheaded but denies any chest pain, shortness of breath, nausea, vomiting, diarrhea.  She reports a remote history of an endoscopy which reportedly was in the setting of GI bleeding with an elevated INR however no source was found.  She also reports a colonoscopy 5 to 6 years ago by Dr. Cristina Gong.  Of note on discharge her discharge hemoglobin was 8.5 and had been trending down during her prior admission.  ED Course: In the ED patient was noted to be slightly hypertensive.  Her labs are notable for hemoglobin of 6.7.  Haptoglobin and LDH are pending.  Review of Systems: As per HPI otherwise 10 point review of systems negative.    Past Medical History:  Diagnosis Date  . Anemia   . CAD (coronary artery disease)    a. s/p CABG in 2000  . Carotid artery occlusion   . CKD (chronic kidney disease)   . Diastolic CHF ([facility]) 11/6576  . Fibromyalgia   . GERD (gastroesophageal reflux disease)   . History of CVA (cerebrovascular accident) 05/24/2014  . History of GI bleed   .  Hyperlipidemia   . Hypertension   . Obesity   . PAF (paroxysmal atrial fibrillation) (HCC)    a. on Eliquis  . Peripheral vascular disease ([place])   . 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 Type II diabetes mellitus ([place]) dx'd 1979    Past Surgical History:  Procedure Laterality Date  . ABDOMINAL AORTAGRAM N/A 04/01/2011   Procedure: ABDOMINAL AORTAGRAM;  Surgeon: Conrad [place], MD;  Location: [facility] CATH LAB;  Service: Cardiovascular;  Laterality: N/A;  . ANGIOPLASTY  06/17/11   Left leg common femoral artery cannulation under u/s Left leg runoff  . CARDIAC CATHETERIZATION  10/11/2017  . CARPAL TUNNEL RELEASE Right   . CATARACT EXTRACTION W/ INTRAOCULAR LENS  IMPLANT, BILATERAL  2004-2005  . CORONARY ARTERY BYPASS GRAFT  2000   CABG X5  . EYE SURGERY Left    "lasered before cataract OR"  . LOWER EXTREMITY ANGIOGRAM Bilateral 04/01/2011   Procedure: LOWER EXTREMITY ANGIOGRAM;  Surgeon: Conrad [place], MD;  Location: [facility] CATH LAB;  Service: Cardiovascular;  Laterality: Bilateral;  . LOWER EXTREMITY ANGIOGRAM Left 06/17/2011   Procedure: LOWER EXTREMITY ANGIOGRAM;  Surgeon: Conrad [place], MD;  Location: [facility] CATH LAB;  Service: Cardiovascular;  Laterality: Left;  . LOWER EXTREMITY ANGIOGRAM N/A 11/18/2011   Procedure: LOWER EXTREMITY ANGIOGRAM;  Surgeon: Conrad [place], MD;  Location: [facility] CATH LAB;  Service: Cardiovascular;  Laterality: N/A;  . PERCUTANEOUS  STENT INTERVENTION Right 04/01/2011   Procedure: PERCUTANEOUS STENT INTERVENTION;  Surgeon: Conrad Wisdom, MD;  Location: [facility] CATH LAB;  Service: Cardiovascular;  Laterality: Right;  rt iliac stent  . PERIPHERAL ARTERIAL STENT GRAFT  04/01/11   right common iliac  . RIGHT/LEFT HEART CATH AND CORONARY/GRAFT ANGIOGRAPHY N/A 10/11/2017   Procedure: RIGHT/LEFT HEART CATH AND CORONARY/GRAFT ANGIOGRAPHY;  Surgeon: Sherren Mocha, MD;   Location: [facility] CV LAB;  Service: Cardiovascular;  Laterality: N/A;  . TEE WITHOUT CARDIOVERSION N/A 11/08/2017   Procedure: TRANSESOPHAGEAL ECHOCARDIOGRAM (TEE);  Surgeon: Sherren Mocha, MD;  Location: [place];  Service: Open Heart Surgery;  Laterality: N/A;  . TEE WITHOUT CARDIOVERSION N/A 12/15/2017   Procedure: TRANSESOPHAGEAL ECHOCARDIOGRAM (TEE);  Surgeon: Larey Dresser, MD;  Location: [facility] ENDOSCOPY;  Service: Cardiovascular;  Laterality: N/A;  . TRANSCATHETER AORTIC VALVE REPLACEMENT, TRANSFEMORAL N/A 11/08/2017   Procedure: TRANSCATHETER AORTIC VALVE REPLACEMENT, TRANSFEMORAL;  Surgeon: Sherren Mocha, MD;  Location: [place];  Service: Open Heart Surgery;  Laterality: N/A;  . TRIGGER FINGER RELEASE Left 1996   thumb     reports that she quit smoking about 27 years ago. Her smoking use included cigarettes. She has a 60.00 pack-year smoking history. She has never used smokeless tobacco. She reports that she drank alcohol. She reports that she does not use drugs.  Allergies  Allergen Reactions  . Morphine And Related Nausea And Vomiting and Other (See Comments)    Chest pain, also   . Lisinopril Cough  . Zoloft [Sertraline Hcl] Nausea Only         Family History  Problem Relation Age of Onset  . Other Brother        intestinal blockage  . Diabetes Brother    Unacceptable: Noncontributory, unremarkable, or negative. Acceptable: Family history reviewed and not pertinent (If you reviewed it)  Prior to Admission medications   Medication Sig Start Date End Date Taking? Authorizing Provider  vancomycin IVPB Inject 750 mg into the vein every 12 (twelve) hours. Indication:  MRSA bacteremia Last Day of Therapy:  01/26/2018 Labs - Sunday/Monday:  CBC/D, BMP, and vancomycin trough. Labs - Thursday:  BMP and vancomycin trough Labs - Every other week:  ESR and CRP Patient taking differently: Inject 1,000 mg into the vein daily. Indication:  MRSA bacteremia Last Day of Therapy:   01/26/2018 Labs - Sunday/Monday:  CBC/D, BMP, and vancomycin trough. Labs - Thursday:  BMP and vancomycin trough Labs - Every other week:  ESR and CRP 12/16/17 01/26/18 Yes Hosie Poisson, MD  acetaminophen (TYLENOL) 500 MG tablet Take 500 mg by mouth daily as needed for moderate pain or headache.    [provider]  amLODipine (NORVASC) 10 MG tablet Take 1 tablet (10 mg total) by mouth daily. Patient taking differently: Take 10 mg by mouth at bedtime.  11/10/17   Eileen Stanford, PA-C  apixaban (ELIQUIS) 5 MG TABS tablet Take 1 tablet (5 mg total) by mouth 2 (two) times daily. 06/03/14   Rai, Vernelle Emerald, MD  aspirin 81 MG chewable tablet Chew 1 tablet (81 mg total) by mouth daily. Patient taking differently: Chew 81 mg by mouth at bedtime.  11/10/17   Eileen Stanford, PA-C  atorvastatin (LIPITOR) 80 MG tablet Take 1 tablet (80 mg total) by mouth daily. Patient taking differently: Take 80 mg by mouth at bedtime.  11/27/12   Baker, Freeman Caldron, PA-C  furosemide (LASIX) 40 MG tablet Take 40 mg by mouth See admin instructions. Take one  tablet (40 mg) by mouth twice daily - morning and afternoon 06/30/17   [provider]  glucose 4 GM chewable tablet Chew 1-4 tablets by mouth once as needed for low blood sugar.     [provider]  insulin NPH Human (HUMULIN N,NOVOLIN N) 100 UNIT/ML injection Inject 0.15 mLs (15 Units total) into the skin 2 (two) times daily. 15 units in the morning and 10 units in the PM 10/12/17   Eileen Stanford, PA-C  insulin regular (NOVOLIN R,HUMULIN R) 100 units/mL injection Inject 5 Units into the skin 3 (three) times daily after meals.     [provider]  losartan (COZAAR) 50 MG tablet Take 1 tablet (50 mg total) by mouth daily. 11/22/17   Eileen Stanford, PA-C  metoprolol tartrate (LOPRESSOR) 25 MG tablet Take 0.5 tablets (12.5 mg total) by mouth 2 (two) times daily. 11/10/17   Eileen Stanford, PA-C  Multiple Vitamin (MULTIVITAMIN  WITH MINERALS) TABS tablet Take 1 tablet by mouth daily. One a Day 50 plus    [provider]  nitroGLYCERIN (NITROSTAT) 0.4 MG SL tablet Place 0.4 mg under the tongue every 5 (five) minutes as needed for chest pain.    [provider]  saccharomyces boulardii (FLORASTOR) 250 MG capsule Take 1 capsule (250 mg total) by mouth 2 (two) times daily. 12/16/17   Hosie Poisson, MD  senna-docusate (SENOKOT-S) 8.6-50 MG tablet Take 1 tablet by mouth at bedtime as needed for mild constipation. 12/16/17   Hosie Poisson, MD    Physical Exam: Vitals:   12/22/17 1645 12/22/17 1700 12/22/17 1745 12/22/17 1800  BP: (!) 148/50 (!) 165/63 (!) 146/56 (!) 153/94  Pulse: 82 95 84 86  Resp: 10 (!) '22 19 13  '$ Temp:      TempSrc:      SpO2: 96% 98% 99% 95%  Weight:      Height:        Constitutional: NAD, calm, comfortable Vitals:   12/22/17 1645 12/22/17 1700 12/22/17 1745 12/22/17 1800  BP: (!) 148/50 (!) 165/63 (!) 146/56 (!) 153/94  Pulse: 82 95 84 86  Resp: 10 (!) '22 19 13  '$ Temp:      TempSrc:      SpO2: 96% 98% 99% 95%  Weight:      Height:       Eyes: Anicteric sclera ENMT: Mucous membranes are moist.  Edentulous.  Neck: [place]wick[place] Respiratory: clear to auscultation bilaterally, no wheezing, no crackles. Normal respiratory effort. No accessory muscle use.  Cardiovascular: Irregularly irregular, 2 out of 6 systolic murmur heard best at right upper sternal border.  Abdomen: no tenderness, no masses palpated. No hepatosplenomegaly. Bowel sounds positive.  Musculoskeletal: 1+ lower extremity edema Skin: Right lower extremity diabetic ulcer, right upper extremity PIC Neurologic: Grossly intact, slowed speech Psychiatric: Normal judgment and insight. Alert and oriented x 3. Normal mood.    Labs on Admission: I have personally reviewed following labs and imaging studies  CBC: Recent Labs  Lab 12/22/17 1533  WBC 7.5  NEUTROABS 5.5  HGB 6.7*  HCT 22.0*  MCV 91.7  PLT 309     Basic Metabolic Panel: Recent Labs  Lab 12/16/17 0409 12/22/17 1533  NA 132* 136  K 3.7 4.2  CL 102 101  CO2 25 24  GLUCOSE 272* 276*  BUN 30* 25*  CREATININE 1.11* 1.32*  CALCIUM 8.2* 9.5   GFR: Estimated Creatinine Clearance: 38.4 mL/min (A) (by C-G formula based on SCr of 1.32  mg/dL (H)). Liver Function Tests: Recent Labs  Lab 12/22/17 1533  AST 29  ALT 25  ALKPHOS 114  BILITOT 0.7  PROT 6.9  ALBUMIN 2.3*   No results for input(s): LIPASE, AMYLASE in the last 168 hours. No results for input(s): AMMONIA in the last 168 hours. Coagulation Profile: Recent Labs  Lab 12/22/17 1533  INR 1.76   Cardiac Enzymes: No results for input(s): CKTOTAL, CKMB, CKMBINDEX, TROPONINI in the last 168 hours. BNP (last 3 results) No results for input(s): PROBNP in the last 8760 hours. HbA1C: No results for input(s): HGBA1C in the last 72 hours. CBG: Recent Labs  Lab 12/15/17 2136 12/16/17 0654 12/16/17 1133 12/22/17 1555  GLUCAP 269* 244* 299* 261*   Lipid Profile: No results for input(s): CHOL, HDL, LDLCALC, TRIG, CHOLHDL, LDLDIRECT in the last 72 hours. Thyroid Function Tests: No results for input(s): TSH, T4TOTAL, FREET4, T3FREE, THYROIDAB in the last 72 hours. Anemia Panel: No results for input(s): VITAMINB12, FOLATE, FERRITIN, TIBC, IRON, RETICCTPCT in the last 72 hours. Urine analysis:    Component Value Date/Time   COLORURINE YELLOW 12/11/2017 1741   APPEARANCEUR CLOUDY (A) 12/11/2017 1741   LABSPEC 1.015 12/11/2017 1741   PHURINE 5.0 12/11/2017 1741   GLUCOSEU >=500 (A) 12/11/2017 1741   HGBUR MODERATE (A) 12/11/2017 1741   BILIRUBINUR NEGATIVE 12/11/2017 1741   KETONESUR NEGATIVE 12/11/2017 1741   PROTEINUR >=300 (A) 12/11/2017 1741   UROBILINOGEN 1.0 05/29/2014 1052   NITRITE NEGATIVE 12/11/2017 1741   LEUKOCYTESUR LARGE (A) 12/11/2017 1741    Radiological Exams on Admission: No results found.  EKG: Independently reviewed.  Irregularly irregular,  widened QRS, unchanged from prior  Assessment/Plan Active Problems:   CAD (coronary artery disease)   Paroxysmal atrial fibrillation   Hyperlipidemia   S/P CABG (coronary artery bypass graft)   History of GI bleed   Peripheral vascular disease, unspecified (HCC)   Type II diabetes mellitus with renal manifestations (HCC)   Severe aortic stenosis   S/P TAVR (transcatheter aortic valve replacement)   CKD (chronic kidney disease), stage III (HCC)   UTI (urinary tract infection)   MRSA bacteremia   Acute blood loss anemia    #) Acute blood loss anemia: At this time it is unclear if patient is having true GI bleeding as her fecal occult blood is still pending.  She was reported to have on rectal exam old blood however this was not tested.  On the differential could be either Heyde syndrome or possibly hemolysis secondary to the valve itself if it is misplaced however on echo it appears to be seated appropriately. -Pending 1 unit packed red blood cell transfusion - Pending haptoglobin and LDH -We will order reticulocyte count, iron studies, B12, folate -Every 6 hour H&H -We will start twice daily PPI with low suspicion of brisk upper GI bleed - We will consult [facility], Dr. Cristina Gong -Clear liquid diet  #) MRSA bacteremia: Per review the chart patient's plan was to treat 6 weeks of IV vancomycin due to MRSA bacteremia in the setting of TAVR. -Continue vancomycin 750 mg -Pharmacy consult for troughs  #) Status post TAVR/chronic atrial fibrillation: Patient unfortunately is at high risk for bleeding due to her multiple medical comorbidities. - Hold apixaban 5 mg twice daily -Heparin drip  #)  coronary artery disease status post CABG/hypertension/hyperlipidemia: - Continue aspirin 81 mg -Continue atorvastatin 80 mg - Continue metoprolol titrate 12.5 mg twice daily -Continue losartan 50 mg daily - Continue amlodipine 10 mg daily  #)  Chronic diastolic heart failure: -Hold home  furosemide 40 mg twice daily  #) Type 2 diabetes on insulin: - Sliding scale insulin, AC at bedtime - Continue NPH 15 units twice daily -Hold home glargine 5 units with meals -Carb restricted diet  #) Prior CVA: -Continue aspirin 81 mg  #) Stage III CKD: Stable  Fluids: Tolerating p.o. Elect lites: Monitor and supplement Nutrition: Clear liquid diet  Prophylaxis: Heparin drip  Disposition: Pending evaluation by GI  Full code     Cristy Folks MD [facility]   If 7PM-7AM, please contact night-coverage [email or web address] Password [facility]  12/22/2017, 6:29 PM

## 2017-12-22 NOTE — Progress Notes (Signed)
[facility]  Pt is active with [facility] for home IV ABX in partnership with [facility] who is providing patient's home health services.  [facility] and [facility] teams will follow pt to support transition home when ordered.  If patient discharges after hours, please call [phone].   Larry Sierras 12/22/2017, 3:28 PM

## 2017-12-22 NOTE — ED Provider Notes (Signed)
[facility] Emergency Department Provider Note MRN:  [id number]  Arrival date & time: 12/22/17     Chief Complaint   Abnormal Lab   History of Present Illness   Kristina Summers is a 82 y.o. year-old female with a history of CAD, anemia, heart failure, GI bleed TAVR presenting to the ED with chief complaint of abnormal lab.  Patient was recently admitted to the hospital for confusion related to a urinary tract infection.  She has been discharged, receiving home health support and IV antibiotics.  She was called today by her healthcare providers, telling her to come to the emergency department due to her low hemoglobin levels.  Reported to be 6.6, down from 8 or 9 during her admission.  Patient endorsing intermittent lightheadedness for the past few days, denying headache or vision change, no chest pain or shortness of breath, no abdominal pain.  Denies black tarry stool, no other sources of bleeding that she can tell.  Patient does take Eliquis.  Review of Systems  A complete 10 system review of systems was obtained and all systems are negative except as noted in the HPI and PMH.   Patient's Health History    Past Medical History:  Diagnosis Date  . Anemia   . CAD (coronary artery disease)    a. s/p CABG in 2000  . Carotid artery occlusion   . CKD (chronic kidney disease)   . Diastolic CHF ([place]) 40/9811  . Fibromyalgia   . GERD (gastroesophageal reflux disease)   . History of CVA (cerebrovascular accident) 05/24/2014  . History of GI bleed   . Hyperlipidemia   . Hypertension   . Obesity   . PAF (paroxysmal atrial fibrillation) (HCC)    a. on Eliquis  . Peripheral vascular disease ([place])   . 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 Type II diabetes mellitus ([place]) dx'd 1979    Past Surgical History:  Procedure Laterality Date  . ABDOMINAL  AORTAGRAM N/A 04/01/2011   Procedure: ABDOMINAL AORTAGRAM;  Surgeon: Conrad Lake Annette, MD;  Location: [facility] CATH LAB;  Service: Cardiovascular;  Laterality: N/A;  . ANGIOPLASTY  06/17/11   Left leg common femoral artery cannulation under u/s Left leg runoff  . CARDIAC CATHETERIZATION  10/11/2017  . CARPAL TUNNEL RELEASE Right   . CATARACT EXTRACTION W/ INTRAOCULAR LENS  IMPLANT, BILATERAL  2004-2005  . CORONARY ARTERY BYPASS GRAFT  2000   CABG X5  . EYE SURGERY Left    "lasered before cataract OR"  . LOWER EXTREMITY ANGIOGRAM Bilateral 04/01/2011   Procedure: LOWER EXTREMITY ANGIOGRAM;  Surgeon: Conrad Clover, MD;  Location[facility] C[facility] LAB;  Service: Cardiovascular;  Laterality: Bilateral;  . LOWER EXTREMITY ANGIOGRAM Left 06/17/2011   Procedure: LOWER EXTREMITY ANGIOGRAM;  Surgeon: Conrad Harwood, MD;  Location: [facility] CATH LAB;  Service: Cardiovascular;  Laterality: Left;  . LOWER EXTREMITY ANGIOGRAM N/A 11/18/2011   Procedure: LOWER EXTREMITY ANGIOGRAM;  Surgeon: Conrad Marion, MD;  Location: [facility] CATH LAB;  Service: Cardiovascular;  Laterality: N/A;  . PERCUTANEOUS STENT INTERVENTION Right 04/01/2011   Procedure: PERCUTANEOUS STENT INTERVENTION;  Surgeon: Conrad Cedarville, MD;  Location: [facility] CATH LAB;  Service: Cardiovascular;  Laterality: Right;  rt iliac stent  . PERIPHERAL ARTERIAL STENT GRAFT  04/01/11   right common iliac  . RIGHT/LEFT HEART CATH AND CORONARY/GRAFT ANGIOGRAPHY N/A 10/11/2017   Procedure: RIGHT/LEFT  HEART CATH AND CORONARY/GRAFT ANGIOGRAPHY;  Surgeon: Sherren Mocha, MD;  Location: [place] CV LAB;  Service: Cardiovascular;  Laterality: N/A;  . TEE WITHOUT CARDIOVERSION N/A 11/08/2017   Procedure: TRANSESOPHAGEAL ECHOCARDIOGRAM (TEE);  Surgeon: Sherren Mocha, MD;  Location: [place];  Service: Open Heart Surgery;  Laterality: N/A;  . TEE WITHOUT CARDIOVERSION N/A 12/15/2017   Procedure: TRANSESOPHAGEAL ECHOCARDIOGRAM (TEE);  Surgeon: Larey Dresser, MD;  Location: [facility] ENDOSCOPY;  Service:  Cardiovascular;  Laterality: N/A;  . TRANSCATHETER AORTIC VALVE REPLACEMENT, TRANSFEMORAL N/A 11/08/2017   Procedure: TRANSCATHETER AORTIC VALVE REPLACEMENT, TRANSFEMORAL;  Surgeon: Sherren Mocha, MD;  Location: [place];  Service: Open Heart Surgery;  Laterality: N/A;  . TRIGGER FINGER RELEASE Left 1996   thumb    Family History  Problem Relation Age of Onset  . Other Brother        intestinal blockage  . Diabetes Brother     Social History   Socioeconomic History  . Marital status: Divorced    Spouse name: Not on file  . Number of children: 3  . Years of education: 49  . Highest education level: Not on file  Occupational History  . Occupation: Retired  Scientific laboratory technician  . Financial resource strain: Not on file  . Food insecurity:    Worry: Not on file    Inability: Not on file  . Transportation needs:    Medical: Not on file    Non-medical: Not on file  Tobacco Use  . Smoking status: Former Smoker    Packs/day: 2.00    Years: 30.00    Pack years: 60.00    Types: Cigarettes    Last attempt to quit: 04/19/1990    Years since quitting: 27.6  . Smokeless tobacco: Never Used  . Tobacco comment: stopped smoking cigarettes 1991  Substance and Sexual Activity  . Alcohol use: Not Currently    Alcohol/week: 0.0 standard drinks    Comment: "tried different alcohols when I was 1st married; never drank much at  ALL"  . Drug use: Never  . Sexual activity: Not Currently  Lifestyle  . Physical activity:    Days per week: Not on file    Minutes per session: Not on file  . Stress: Not on file  Relationships  . Social connections:    Talks on phone: Not on file    Gets together: Not on file    Attends religious service: Not on file    Active member of club or organization: Not on file    Attends meetings of clubs or organizations: Not on file    Relationship status: Not on file  . Intimate partner violence:    Fear of current or ex partner: Not on file    Emotionally abused: Not  on file    Physically abused: Not on file    Forced sexual activity: Not on file  Other Topics Concern  . Not on file  Social History Narrative   Divorced.  Native of [place].  Formerly worked as Education administrator person. 01/13/17 lives alone   Caffeine use: drinks decaf tea and coffee     Physical Exam  Vital Signs and Nursing Notes reviewed Vitals:   12/22/17 2005 12/22/17 2024  BP: (!) 169/65 (!) 168/64  Pulse: 92 75  Resp: 18 18  Temp: 98.1 F (36.7 C) 98.4 F (36.9 C)  SpO2: 94% 93%    CONSTITUTIONAL: Well-appearing, NAD NEURO:  Alert and oriented x 3, no focal deficits EYES:  eyes equal  and reactive ENT/NECK:  no LAD, no JVD CARDIO: Regular rate, well-perfused, normal S1 and S2 PULM:  CTAB no wheezing or rhonchi GI/GU:  normal bowel sounds, non-distended, non-tender, gross blood on rectal exam MSK/SPINE:  No gross deformities, no edema SKIN:  no rash, atraumatic PSYCH:  Appropriate speech and behavior  Diagnostic and Interventional Summary    EKG Interpretation  Date/Time:  Thursday December 22 2017 15:13:37 EDT Ventricular Rate:  90 PR Interval:    QRS Duration: 122 QT Interval:  372 QTC Calculation: 455 R Axis:   47 Text Interpretation:  Atrial fibrillation Non-specific intra-ventricular conduction delay Nonspecific T wave abnormality Abnormal ECG Confirmed by Gerlene Fee [phone]) on 12/22/2017 3:50:51 PM      Labs Reviewed  CBC WITH DIFFERENTIAL/PLATELET - Abnormal; Notable for the following components:      Result Value   RBC 2.40 (*)    Hemoglobin 6.7 (*)    HCT 22.0 (*)    All other components within normal limits  COMPREHENSIVE METABOLIC PANEL - Abnormal; Notable for the following components:   Glucose, Bld 276 (*)    BUN 25 (*)    Creatinine, Ser 1.32 (*)    Albumin 2.3 (*)    GFR calc non Af Amer 37 (*)    GFR calc Af Amer 43 (*)    All other components within normal limits  PROTIME-INR - Abnormal; Notable for the following components:    Prothrombin Time 20.4 (*)    All other components within normal limits  LACTATE DEHYDROGENASE - Abnormal; Notable for the following components:   LDH 216 (*)    All other components within normal limits  CBG MONITORING, ED - Abnormal; Notable for the following components:   Glucose-Capillary 261 (*)    All other components within normal limits  POC OCCULT BLOOD, ED - Abnormal; Notable for the following components:   Fecal Occult Bld POSITIVE (*)    All other components within normal limits  VANCOMYCIN, RANDOM  URINALYSIS, ROUTINE W REFLEX MICROSCOPIC  HAPTOGLOBIN  HEPARIN LEVEL (UNFRACTIONATED)  APTT  CBC  BASIC METABOLIC PANEL  HEMOGLOBIN AND HEMATOCRIT, BLOOD  HEMOGLOBIN AND HEMATOCRIT, BLOOD  VITAMIN B12  FOLATE RBC  FERRITIN  IRON AND TIBC  RETICULOCYTES  I-STAT TROPONIN, ED  TYPE AND SCREEN  PREPARE RBC (CROSSMATCH)    No orders to display    Medications  aspirin chewable tablet 81 mg (has no administration in time range)  amLODipine (NORVASC) tablet 10 mg (has no administration in time range)  atorvastatin (LIPITOR) tablet 80 mg (has no administration in time range)  losartan (COZAAR) tablet 50 mg (has no administration in time range)  metoprolol tartrate (LOPRESSOR) tablet 12.5 mg (has no administration in time range)  nitroGLYCERIN (NITROSTAT) SL tablet 0.4 mg (has no administration in time range)  insulin NPH Human (HUMULIN N,NOVOLIN N) injection 15 Units (has no administration in time range)  saccharomyces boulardii (FLORASTOR) capsule 250 mg (has no administration in time range)  senna-docusate (Senokot-S) tablet 1 tablet (has no administration in time range)  multivitamin with minerals tablet 1 tablet (has no administration in time range)  sodium chloride flush (NS) 0.9 % injection 3 mL (has no administration in time range)  sodium chloride flush (NS) 0.9 % injection 3 mL (has no administration in time range)  0.9 %  sodium chloride infusion (has no  administration in time range)  acetaminophen (TYLENOL) tablet 650 mg (has no administration in time range)    Or  acetaminophen (TYLENOL)  suppository 650 mg (has no administration in time range)  ondansetron (ZOFRAN) tablet 4 mg (has no administration in time range)    Or  ondansetron (ZOFRAN) injection 4 mg (has no administration in time range)  insulin aspart (novoLOG) injection 0-9 Units (has no administration in time range)  insulin aspart (novoLOG) injection 0-5 Units (has no administration in time range)  sodium chloride flush (NS) 0.9 % injection 10-40 mL (has no administration in time range)  pantoprazole (PROTONIX) EC tablet 40 mg (has no administration in time range)  vancomycin (VANCOCIN) IVPB 1000 mg/200 mL premix (has no administration in time range)  heparin ADULT infusion 100 units/mL (25000 units/253mL sodium chloride 0.45%) (has no administration in time range)  sodium chloride 0.9 % bolus 500 mL (0 mLs Intravenous Stopped 12/22/17 1909)     Procedures Critical Care Critical Care Documentation Critical care time provided by me (excluding procedures): 32 minutes  Condition necessitating critical care: Symptomatic anemia, need for blood transfusion, GI bleed  Components of critical care management: reviewing of prior records, laboratory and imaging interpretation, frequent re-examination and reassessment of vital signs, administration of packed red blood cells, discussion with consulting services.    ED Course and Medical Decision Making  I have reviewed the triage vital signs and the nursing notes.  Pertinent labs & imaging results that were available during my care of the patient were reviewed by me and considered in my medical decision making (see below for details).  Story and symptoms consistent with symptomatic anemia and this 82 year old female, anticoagulated, history of GI bleed, also history of TAVR raising the question of red blood cell destruction/hemolysis.   Labs pending, anticipating admission for transfusion.    Rectal exam concerning for lower GI bleed.  Transfuse 1 unit of blood here in the emergency department, admitted to hospitalist service for further care and evaluation.  Barth Kirks. Sedonia Small, MD [place] [email or web address]  Final Clinical Impressions(s) / ED Diagnoses     ICD-10-CM   1. Gastrointestinal hemorrhage, unspecified gastrointestinal hemorrhage type K92.2   2. Symptomatic anemia D64.9     ED Discharge Orders    None         Maudie Flakes, MD 12/22/17 2247

## 2017-12-22 NOTE — ED Provider Notes (Signed)
Patient placed in Quick Look pathway, seen and evaluated   Chief Complaint: abnormal lab, sent by dr  HPI:   Hemoglobin 6.6 noted on last yesterday.  H/o anemia requiring transfusions. On eloquis. Recent admission for MRSA bacteremia getting vancomycin through RUE PICC line per ID.   ROS: negative: CP, SOB. Positive: dizziness with movement.   Physical Exam:   Gen: No distress  Neuro: Awake and Alert  Skin: Warm    Focused Exam: aphasia noted (h/o CVA with aphasia). RRR. Lungs CTAB. Pale conjunctiva. MMM. RUE PICC in place.    Initiation of care has begun. The patient has been counseled on the process, plan, and necessity for staying for the completion/evaluation, and the remainder of the medical screening examination    Kristina Summers 12/22/17 1521    Julianne Rice, MD 12/23/17 [phone]

## 2017-12-22 NOTE — Progress Notes (Signed)
ANTICOAGULATION CONSULT NOTE  and Antibiotic Consult Note  Pharmacy Consult for heparin Indication: atrial fibrillation   Pharmacy Consult for vancomycin Indication: MRSA bactermia  Allergies  Allergen Reactions  . Morphine And Related Nausea And Vomiting and Other (See Comments)    Chest pain, also   . Lisinopril Cough  . Zoloft [Sertraline Hcl] Nausea Only         Patient Measurements: Height: 5\' 6"  (167.6 cm) Weight: 198 lb (89.8 kg) IBW/kg (Calculated) : 59.3 Heparin Dosing Weight: 78.8  Vital Signs: Temp: 97.9 F (36.6 C) (09/05 1526) Temp Source: Oral (09/05 1526) BP: 157/57 (09/05 1845) Pulse Rate: 73 (09/05 1845)  Labs: Recent Labs    12/22/17 1533  HGB 6.7*  HCT 22.0*  PLT 309  LABPROT 20.4*  INR 1.76  CREATININE 1.32*    Estimated Creatinine Clearance: 38.4 mL/min (A) (by C-G formula based on SCr of 1.32 mg/dL (H)).   Medical History: Past Medical History:  Diagnosis Date  . Anemia   . CAD (coronary artery disease)    a. s/p CABG in 2000  . Carotid artery occlusion   . CKD (chronic kidney disease)   . Diastolic CHF ([place]) 14/4315  . Fibromyalgia   . GERD (gastroesophageal reflux disease)   . History of CVA (cerebrovascular accident) 05/24/2014  . History of GI bleed   . Hyperlipidemia   . Hypertension   . Obesity   . PAF (paroxysmal atrial fibrillation) (HCC)    a. on Eliquis  . Peripheral vascular disease ([place])   . 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 Type II diabetes mellitus ([place]) dx'd 1979    Medications:   Assessment: 82 yo F with on chronic anticoagulation with apixaban for Afib. Admitted 9/5 with anemia which was discovered at a lab draw for antibiotic therapy. Unsure when the last apixaban dose was PTA, at the latest it was this morning. Hgb on admit was 6.7, receiving RBCs. Platelets 309. No  bleeding noted in chart.  CHADSVASC: 8-9  Pt was recently discharged from the hospital on 8/30 and was continuing IV vancomycin therapy for a MRSA bacteremia. WBC 7.5, sCr 1.32, afebrile.   PTA vancomycin dose 1000mg  IV Q24H, with end of 10/10 12/22/2017 1710 vanc random level: 23   Goal of Therapy:  Heparin level 0.3-0.7 units/ml Monitor platelets by anticoagulation protocol: Yes   Plan:  Heparin 1200 units/hr IV infusion 8hr aPTT Daily aPTT/heparin level/ CBC, monitor for s/s bleeding  Cont vancomycin 1000mg  IV Q24H  Monitor renal function, troughs prn, clinical status.    Harrietta Guardian, PharmD PGY1 Pharmacy Resident 12/22/2017    8:28 PM

## 2017-12-22 NOTE — Telephone Encounter (Signed)
Because of concern for the patient called her daughter and advised her of Greg's concern about the labs and need for the patient to be evaluated at the ED today.

## 2017-12-22 NOTE — Telephone Encounter (Signed)
Recommend that Kristina Summers be advised to go to the Emergency Room for further evaluation of her dropping hemoglobin level.

## 2017-12-22 NOTE — Telephone Encounter (Signed)
Per message from Marya Amsler called the patient to advise her that her Hemoglobin is low at 6.6 and she needs to be seen at the ED. Patient advised she is home alone and does not have a ride until Sunday 12/25/17 when her daughter comes back from vacation. Asked if she had other family, neighbor or church member she could ask and was advised no she is new to the area and knows no one. Tried to impress upon her how important this is so she would go to the ED. She advised she doesn't want to create a bill so the EMS call is out of the question. Advised her to call her daughter and tell her what we discussed and see if she could have someone come and bring her to the ED for evaluation. She advise she will advised her will check back in 1 hour to see what she was going to do.

## 2017-12-23 ENCOUNTER — Encounter (HOSPITAL_COMMUNITY): Payer: Self-pay

## 2017-12-23 DIAGNOSIS — I481 Persistent atrial fibrillation: Secondary | ICD-10-CM

## 2017-12-23 DIAGNOSIS — I2510 Atherosclerotic heart disease of native coronary artery without angina pectoris: Secondary | ICD-10-CM

## 2017-12-23 LAB — BASIC METABOLIC PANEL WITH GFR
CO2: 25 mmol/L (ref 22–32)
Calcium: 9 mg/dL (ref 8.9–10.3)
Chloride: 105 mmol/L (ref 98–111)
Creatinine, Ser: 1.12 mg/dL — ABNORMAL HIGH (ref 0.44–1.00)
GFR calc Af Amer: 52 mL/min — ABNORMAL LOW (ref >60)
Glucose, Bld: 200 mg/dL — ABNORMAL HIGH (ref 70–99)

## 2017-12-23 LAB — RETICULOCYTES
RBC.: 2.65 MIL/uL — ABNORMAL LOW (ref 3.87–5.11)
Retic Count, Absolute: 90.1 10*3/uL (ref 19.0–186.0)
Retic Ct Pct: 3.4 % — ABNORMAL HIGH (ref 0.4–3.1)

## 2017-12-23 LAB — URINALYSIS, ROUTINE W REFLEX MICROSCOPIC
Bacteria, UA: NONE SEEN
Bilirubin Urine: NEGATIVE
Glucose, UA: 50 mg/dL — AB
Hgb urine dipstick: NEGATIVE
Ketones, ur: NEGATIVE mg/dL
Leukocytes, UA: NEGATIVE
Nitrite: NEGATIVE
Protein, ur: 30 mg/dL — AB
Specific Gravity, Urine: 1.01 (ref 1.005–1.030)
pH: 6 (ref 5.0–8.0)

## 2017-12-23 LAB — HEPARIN LEVEL (UNFRACTIONATED): Heparin Unfractionated: >2.2 [IU]/mL — ABNORMAL HIGH (ref 0.30–0.70)

## 2017-12-23 LAB — APTT
aPTT: 69 seconds — ABNORMAL HIGH (ref 24–36)
aPTT: 119 seconds — ABNORMAL HIGH (ref 24–36)
aPTT: 106 seconds — ABNORMAL HIGH (ref 24–36)

## 2017-12-23 LAB — CBC
HCT: 23.5 % — ABNORMAL LOW (ref 36.0–46.0)
Hemoglobin: 7.4 g/dL — ABNORMAL LOW (ref 12.0–15.0)
MCH: 27.9 pg (ref 26.0–34.0)
MCHC: 31.5 g/dL (ref 30.0–36.0)
MCV: 88.7 fL (ref 78.0–100.0)
Platelets: 284 10*3/uL (ref 150–400)
RBC: 2.65 MIL/uL — ABNORMAL LOW (ref 3.87–5.11)
RDW: 16 % — ABNORMAL HIGH (ref 11.5–15.5)
WBC: 8.6 10*3/uL (ref 4.0–10.5)

## 2017-12-23 LAB — HEMOGLOBIN AND HEMATOCRIT, BLOOD
HCT: 25.9 % — ABNORMAL LOW (ref 36.0–46.0)
HCT: 27.1 % — ABNORMAL LOW (ref 36.0–46.0)
Hemoglobin: 8.1 g/dL — ABNORMAL LOW (ref 12.0–15.0)
Hemoglobin: 8.4 g/dL — ABNORMAL LOW (ref 12.0–15.0)

## 2017-12-23 LAB — BASIC METABOLIC PANEL
Anion gap: 7 (ref 5–15)
BUN: 21 mg/dL (ref 8–23)
GFR calc non Af Amer: 45 mL/min — ABNORMAL LOW (ref >60)
Potassium: 4 mmol/L (ref 3.5–5.1)
Sodium: 137 mmol/L (ref 135–145)

## 2017-12-23 LAB — FERRITIN: Ferritin: 42 ng/mL (ref 11–307)

## 2017-12-23 LAB — VITAMIN B12: Vitamin B-12: 755 pg/mL (ref 180–914)

## 2017-12-23 LAB — GLUCOSE, CAPILLARY
Glucose-Capillary: 217 mg/dL — ABNORMAL HIGH (ref 70–99)
Glucose-Capillary: 216 mg/dL — ABNORMAL HIGH (ref 70–99)
Glucose-Capillary: 162 mg/dL — ABNORMAL HIGH (ref 70–99)
Glucose-Capillary: 157 mg/dL — ABNORMAL HIGH (ref 70–99)
Glucose-Capillary: 108 mg/dL — ABNORMAL HIGH (ref 70–99)

## 2017-12-23 LAB — HAPTOGLOBIN: HAPTOGLOBIN: 178 mg/dL (ref 34–200)

## 2017-12-23 LAB — IRON AND TIBC
Iron: 14 ug/dL — ABNORMAL LOW (ref 28–170)
Saturation Ratios: 5 % — ABNORMAL LOW (ref 10.4–31.8)
TIBC: 291 ug/dL (ref 250–450)
UIBC: 277 ug/dL

## 2017-12-23 LAB — PREPARE RBC (CROSSMATCH)

## 2017-12-23 LAB — MRSA PCR SCREENING: MRSA by PCR: NEGATIVE

## 2017-12-23 MED ORDER — SODIUM CHLORIDE 0.9% IV SOLUTION
Freq: Once | INTRAVENOUS | Status: AC
  Administered 2017-12-23: 15:00:00 via INTRAVENOUS

## 2017-12-23 MED ORDER — SODIUM CHLORIDE 0.9 % IV SOLN: 15.0000 mg/kg | Freq: Once | INTRAVENOUS | Status: DC

## 2017-12-23 MED ORDER — SODIUM CHLORIDE 0.9 % IV SOLN
510.0000 mg | INTRAVENOUS | Status: DC
  Administered 2017-12-23: 510 mg via INTRAVENOUS
  Filled 2017-12-23: qty 17

## 2017-12-23 MED ORDER — METHOCARBAMOL 1000 MG/10ML IJ SOLN: 500.0000 mg | Freq: Four times a day (QID) | INTRAVENOUS | Status: DC | PRN

## 2017-12-23 MED ORDER — HEPARIN (PORCINE) IN NACL 100-0.45 UNIT/ML-% IJ SOLN
800.0000 [IU]/h | INTRAMUSCULAR | Status: DC
  Administered 2017-12-23: 1050 [IU]/h via INTRAVENOUS
  Filled 2017-12-23: qty 250

## 2017-12-23 MED ORDER — METHOCARBAMOL 500 MG PO TABS
500.0000 mg | ORAL_TABLET | Freq: Four times a day (QID) | ORAL | Status: DC | PRN
  Administered 2017-12-23: 500 mg via ORAL
  Filled 2017-12-23: qty 1

## 2017-12-23 NOTE — Progress Notes (Signed)
ANTICOAGULATION CONSULT NOTE - Initial Consult  Pharmacy Consult for heparin Indication: atrial fibrillation  Allergies  Allergen Reactions  . Morphine And Related Nausea And Vomiting and Other (See Comments)    Chest pain, also   . Lisinopril Cough  . Zoloft [Sertraline Hcl] Nausea Only         Patient Measurements: Height: 5\' 6"  (167.6 cm) Weight: 193 lb 5.5 oz (87.7 kg) IBW/kg (Calculated) : 59.3 Heparin Dosing Weight: 78  Vital Signs: Temp: 98.1 F (36.7 C) (09/06 0559) Temp Source: Oral (09/06 0559) BP: 150/51 (09/06 0559) Pulse Rate: 97 (09/06 0559)  Labs: Recent Labs    12/22/17 1533 12/23/17 0220 12/23/17 0754  HGB 6.7* 7.4*  --   HCT 22.0* 23.5*  --   PLT 309 284  --   APTT  --  69* 119*  LABPROT 20.4*  --   --   INR 1.76  --   --   HEPARINUNFRC  --  >2.20*  --   CREATININE 1.32* 1.12*  --     Estimated Creatinine Clearance: 44.7 mL/min (A) (by C-G formula based on SCr of 1.12 mg/dL (H)).   Medical History: Past Medical History:  Diagnosis Date  . Anemia   . CAD (coronary artery disease)    a. s/p CABG in 2000  . Carotid artery occlusion   . CKD (chronic kidney disease)   . Diastolic CHF ([place]) 63/8453  . Fibromyalgia   . GERD (gastroesophageal reflux disease)   . History of CVA (cerebrovascular accident) 05/24/2014  . History of GI bleed   . Hyperlipidemia   . Hypertension   . Obesity   . PAF (paroxysmal atrial fibrillation) (HCC)    a. on Eliquis  . Peripheral vascular disease ([place])   . 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 Type II diabetes mellitus ([place]) dx'd 1979   Assessment: 82 yo female with h/o afib on apixaban PTA. Admitted for anemia with possible GI bleed. Given that patient is being transitioned from apixaban to heparin, we will need to draw aPTT and heparin levels due to apixaban's effect on heparin  levels. Heparin level this AM was >2.2, with aPTT supratherapeutic at 119 sec.   Goal of Therapy:  aPTT 66-102 seconds  Heparin level 0.3-0.7 Monitor platelets by anticoagulation protocol: Yes   Plan:  Hold heparin drip for 30 min and decrease heparin to 1050 units/hr APTT in 8 hours Daily heparin level and aPTT until heparin level correlates with aPTT.  Monitor for s/sx of further bleeding.   Kristina Summers A. Levada Dy, PharmD, [place] Pager: [phone] Please utilize Amion for appropriate phone number to reach the unit pharmacist ([place])   12/23/2017,10:07 AM

## 2017-12-23 NOTE — Plan of Care (Signed)

## 2017-12-23 NOTE — Progress Notes (Signed)
[facility] PROGRESS NOTE  Kristina Summers YHC:[id number] DOB: Sep 13, 1935 DOA: 12/22/2017   PCP: Care, Samak     Assessment/Plan: Active Problems:   CAD (coronary artery disease)   Paroxysmal atrial fibrillation   Hyperlipidemia   S/P CABG (coronary artery bypass graft)   History of GI bleed   Peripheral vascular disease, unspecified (HCC)   Type II diabetes mellitus with renal manifestations (HCC)   Severe aortic stenosis   S/P TAVR (transcatheter aortic valve replacement)   CKD (chronic kidney disease), stage III (HCC)   UTI (urinary tract infection)   MRSA bacteremia   Acute blood loss anemia   82 y.o. female with medical history significant of coronary artery disease status post CABG in 2000 with left heart catheterization on 10/11/2017 showing most of CABGs patient, history of CVA, gated by slowed speech, critical aortic stenosis status post TAVR on 11/08/2017, hypertension, hyperlipidemia, peripheral vascular disease, diastolic heart failure, paroxysmal atrial fibrillation on apixaban, stage III CKD, type 2 diabetes who was recently admitted on 12/11/2017 and discharged on 12/16/2017 with MRSA bacteremia on IV vancomycin for 6 weeks who presents because her weekly lab draws were noted to be abnormal with anemia.  Patient herself denies noticing any GI bleeding or melena.  She reports occasionally being slightly lightheaded but denies any chest pain, shortness of breath, nausea, vomiting, diarrhea.  She reports a remote history of an endoscopy which reportedly was in the setting of GI bleeding with an elevated INR however no source was found.  She also reports a colonoscopy 5 to 6 years ago by Dr. Cristina Gong.  Of note on discharge her discharge hemoglobin was 8.5 and had been trending down during her prior admission.   Patient has been admitted for further workup of her anemia,  Assessment and plan  #) Acute blood loss anemia: At this time it is unclear if patient is having  true GI bleeding as her fecal occult blood is still pending.  She was reported to have on rectal exam old blood however this was not tested.  On the differential could be either Heyde syndrome or possibly hemolysis secondary to the valve itself if it is misplaced however on echo it appears to be seated appropriately. -s/p 2 unit packed red blood cell transfusion -haptoglobin 178  and LDH 216 -We will order reticulocyte count, iron studies, B12, folate -Every 6 hour H&H - twice daily PPI with low suspicion of brisk upper GI bleed - seen by [facility], Dr Penelope Coop  - Clear liquid diet,PPI ,  She may need EGD   #) MRSA bacteremia: Per review the chart patient's plan was to treat 6 weeks of IV vancomycin due to MRSA bacteremia in the setting of TAVR. -Continue vancomycin 750 mg until  End date: 01/26/18 -Pharmacy consult for troughs  #) Status post TAVR/chronic atrial fibrillation: Patient unfortunately is at high risk for bleeding due to her multiple medical comorbidities. - Hold apixaban 5 mg twice daily - Heparin drip ,    #)  coronary artery disease status post CABG/hypertension/hyperlipidemia: - Continue aspirin 81 mg -Continue atorvastatin 80 mg - Continue metoprolol titrate 12.5 mg twice daily -Continue losartan 50 mg daily - Continue amlodipine 10 mg daily  #) Chronic diastolic heart failure: -Hold home furosemide 40 mg twice daily  #) Type 2 diabetes on insulin: - Sliding scale insulin,   - Continue NPH 15 units twice daily -Hold home glargine 5 units with meals -Carb restricted diet  #)  Prior CVA: -Continue aspirin 81 mg  #) Stage III CKD: Stable    DVT prophylaxsis heparin drip  Code Status:  Full code   Family Communication: Discussed in detail with the patient, all imaging results, lab results explained to the patient   Disposition Plan:  As above       Consultants:  GI  Procedures:  None   Antibiotics: Anti-infectives (From admission, onward)    Start     Dose/Rate Route Frequency Ordered Stop   12/23/17 0500  vancomycin (VANCOCIN) IVPB 1000 mg/200 mL premix     1,000 mg 200 mL/hr over 60 Minutes Intravenous Every 24 hours 12/22/17 2028     12/22/17 2115  vancomycin IVPB  Status:  Discontinued    Note to Pharmacy:  Indication:  MRSA bacteremia Last Day of Therapy:  01/26/2018 Labs - Sunday/Monday:  CBC/D, BMP, and vancomycin trough. Labs - Thursday:  BMP and vancomycin trough Labs - Every other week:  ESR and CRP     1,000 mg Intravenous Every 24 hours 12/22/17 2104 12/22/17 2112         HPI/Subjective:  slurred speech at baseline, from previous CVA this is not new, she denies any ongoing bleeding, she noted melena couple of days ago but no frank hematochezia She denies any chest pain any shortness of breath  Objective: Vitals:   12/22/17 2005 12/22/17 2024 12/22/17 2322 12/23/17 0559  BP: (!) 169/65 (!) 168/64 (!) 158/57 (!) 150/51  Pulse: 92 75 76 97  Resp: '18 18 20 17  '$ Temp: 98.1 F (36.7 C) 98.4 F (36.9 C) 98.1 F (36.7 C) 98.1 F (36.7 C)  TempSrc: Oral Oral Oral Oral  SpO2: 94% 93% 94% 93%  Weight: 87.7 kg     Height:        Intake/Output Summary (Last 24 hours) at 12/23/2017 1255 Last data filed at 12/23/2017 0900 Gross per 24 hour  Intake 1143.83 ml  Output 1000 ml  Net 143.83 ml    Exam:  Examination:  General exam: Appears calm and comfortable  Respiratory system: Clear to auscultation. Respiratory effort normal. Cardiovascular system: S1 & S2 heard, RRR. No JVD, murmurs, rubs, gallops or clicks. No pedal edema. Gastrointestinal system: Abdomen is nondistended, soft and nontender. No organomegaly or masses felt. Normal bowel sounds heard. Central nervous system: Alert and oriented. No focal neurological deficits. Extremities: Symmetric 5 x 5 power. Skin: No rashes, lesions or ulcers Psychiatry: Judgement and insight appear normal. Mood & affect appropriate.     Data Reviewed: I have  personally reviewed following labs and imaging studies  Micro Results No results found for this or any previous visit (from the past 240 hour(s)).  Radiology Reports Ct Head Wo Contrast  Result Date: 12/11/2017 CLINICAL DATA:  Patient fell. EXAM: CT HEAD WITHOUT CONTRAST TECHNIQUE: Contiguous axial images were obtained from the base of the skull through the vertex without intravenous contrast. COMPARISON:  July 08, 2017 FINDINGS: Brain: Evaluation mildly marred due to motion. Within this limitation, no subdural, epidural, or subarachnoid hemorrhage. No mass effect or midline shift. The left frontal parietal infarct is again identified. No acute cortical ischemia infarct noted. Cerebellum and brainstem are normal. Basal cisterns are normal. Vascular: Calcified atherosclerosis in the intracranial carotids. Skull: Normal. Negative for fracture or focal lesion. Sinuses/Orbits: No acute finding. Other: None. IMPRESSION: 1. No acute intracranial abnormalities noted. 2. Chronic left frontal parietal infarct. Electronically Signed   By: Dorise Bullion III M.D   On: 12/11/2017 18:32  Dg Chest Port 1 View  Result Date: 12/11/2017 CLINICAL DATA:  Fever. EXAM: PORTABLE CHEST 1 VIEW COMPARISON:  November 08, 2016 FINDINGS: Cardiomegaly. The hila and mediastinum are normal. Haziness/increased interstitial markings in the right apex and diffusely on the left. No focal infiltrate. No other acute abnormalities. IMPRESSION: Increased interstitial markings diffusely on the left and confined to the right apex. The findings could represent asymmetric edema or an infectious process. The findings are unchanged since November 08, 2017. Electronically Signed   By: Dorise Bullion III M.D   On: 12/11/2017 16:04   Dg Chest Port 1v Same Day  Result Date: 12/15/2017 CLINICAL DATA:  PICC placement EXAM: PORTABLE CHEST 1 VIEW COMPARISON:  12/11/2017 FINDINGS: Right PICC line is in place with the tip at the cavoatrial junction. Prior  median sternotomy, CABG and aortic valve repair. Mild interstitial prominence throughout the lungs could reflect early interstitial edema. No visible effusions or acute bony abnormality. IMPRESSION: Right PICC line tip at the cavoatrial junction. Cardiomegaly. Mild interstitial prominence, question early interstitial edema. Electronically Signed   By: Rolm Baptise M.D.   On: 12/15/2017 19:16   [place] Ekg Site Rite  Result Date: 12/15/2017 If Site Rite image not attached, placement could not be confirmed due to current cardiac rhythm.    CBC Recent Labs  Lab 12/22/17 1533 12/23/17 0220 12/23/17 0915  WBC 7.5 8.6  --   HGB 6.7* 7.4* 8.1*  HCT 22.0* 23.5* 25.9*  PLT 309 284  --   MCV 91.7 88.7  --   MCH 27.9 27.9  --   MCHC 30.5 31.5  --   RDW 15.4 16.0*  --   LYMPHSABS 1.1  --   --   MONOABS 0.5  --   --   EOSABS 0.3  --   --   BASOSABS 0.1  --   --     Chemistries  Recent Labs  Lab 12/22/17 1533 12/23/17 0220  NA 136 137  K 4.2 4.0  CL 101 105  CO2 24 25  GLUCOSE 276* 200*  BUN 25* 21  CREATININE 1.32* 1.12*  CALCIUM 9.5 9.0  AST 29  --   ALT 25  --   ALKPHOS 114  --   BILITOT 0.7  --    ------------------------------------------------------------------------------------------------------------------ estimated creatinine clearance is 44.7 mL/min (A) (by C-G formula based on SCr of 1.12 mg/dL (H)). ------------------------------------------------------------------------------------------------------------------ No results for input(s): HGBA1C in the last 72 hours. ------------------------------------------------------------------------------------------------------------------ No results for input(s): CHOL, HDL, LDLCALC, TRIG, CHOLHDL, LDLDIRECT in the last 72 hours. ------------------------------------------------------------------------------------------------------------------ No results for input(s): TSH, T4TOTAL, T3FREE, THYROIDAB in the last 72 hours.  Invalid  input(s): FREET3 ------------------------------------------------------------------------------------------------------------------ Recent Labs    12/23/17 0220  VITAMINB12 755  FERRITIN 42  TIBC 291  IRON 14*  RETICCTPCT 3.4*    Coagulation profile Recent Labs  Lab 12/22/17 1533  INR 1.76    No results for input(s): DDIMER in the last 72 hours.  Cardiac Enzymes No results for input(s): CKMB, TROPONINI, MYOGLOBIN in the last 168 hours.  Invalid input(s): CK ------------------------------------------------------------------------------------------------------------------ Invalid input(s): POCBNP   CBG: Recent Labs  Lab 12/22/17 1555 12/22/17 2308 12/23/17 0737 12/23/17 1218  GLUCAP 261* 217* 216* 162*       Studies: No results found.    Lab Results  Component Value Date   HGBA1C 6.0 (H) 11/04/2017   HGBA1C 6.5 (H) 06/11/2017   HGBA1C 10.4 (H) 05/29/2014   Lab Results  Component Value Date   MICROALBUR 1.25  08/06/2009   LDLCALC 48 Jul 01, 202016   CREATININE 1.12 (H) 12/23/2017       Scheduled Meds: . sodium chloride   Intravenous Once  . amLODipine  10 mg Oral Daily  . aspirin  81 mg Oral QHS  . atorvastatin  80 mg Oral QHS  . insulin aspart  0-5 Units Subcutaneous QHS  . insulin aspart  0-9 Units Subcutaneous TID WC  . insulin NPH Human  15 Units Subcutaneous BID AC & HS  . losartan  50 mg Oral Daily  . metoprolol tartrate  12.5 mg Oral BID  . multivitamin with minerals  1 tablet Oral Daily  . pantoprazole  40 mg Oral BID  . saccharomyces boulardii  250 mg Oral BID  . sodium chloride flush  3 mL Intravenous Q12H   Continuous Infusions: . sodium chloride    . ferumoxytol 510 mg (12/23/17 0952)  . heparin 1,050 Units/hr (12/23/17 1123)  . vancomycin 1,000 mg (12/23/17 5916)     LOS: 1 day    Time spent: >30 MINS    Reyne Dumas  [facility] Pager [phone]. If 7PM-7AM, please contact night-coverage at [email or web address], password  [facility] 12/23/2017, 12:55 PM  LOS: 1 day

## 2017-12-23 NOTE — Progress Notes (Signed)
[place] for heparin Indication: atrial fibrillation  Allergies  Allergen Reactions  . Morphine And Related Nausea And Vomiting and Other (See Comments)    Chest pain, also   . Lisinopril Cough  . Zoloft [Sertraline Hcl] Nausea Only         Patient Measurements: Height: 5\' 6"  (167.6 cm) Weight: 193 lb 5.5 oz (87.7 kg) IBW/kg (Calculated) : 59.3 Heparin Dosing Weight: 78  Vital Signs: Temp: 97.7 F (36.5 C) (09/06 1721) Temp Source: Oral (09/06 1721) BP: 157/54 (09/06 1721) Pulse Rate: 75 (09/06 1721)  Labs: Recent Labs    12/22/17 1533 12/23/17 0220 12/23/17 0754 12/23/17 0915 12/23/17 1915  HGB 6.7* 7.4*  --  8.1* 8.4*  HCT 22.0* 23.5*  --  25.9* 27.1*  PLT 309 284  --   --   --   APTT  --  69* 119*  --  106*  LABPROT 20.4*  --   --   --   --   INR 1.76  --   --   --   --   HEPARINUNFRC  --  >2.20*  --   --   --   CREATININE 1.32* 1.12*  --   --   --     Estimated Creatinine Clearance: 44.7 mL/min (A) (by C-G formula based on SCr of 1.12 mg/dL (H)).    Assessment: 82 yo female with h/o afib on apixaban PTA. Admitted for anemia with possible GI bleed.  Given that patient is being transitioned from apixaban to heparin, we will need to draw aPTT and heparin levels due to apixaban's effect on heparin levels.   PTT this PM = 106 seconds  Goal of Therapy:  aPTT 66-102 seconds  Heparin level 0.3-0.7 Monitor platelets by anticoagulation protocol: Yes   Plan:  Decrease heparin to 950 units / hr Daily heparin level and aPTT until heparin level correlates with aPTT.  Monitor for s/sx of further bleeding.   Thank you Anette Guarneri, PharmD [phone] Please utilize Amion for appropriate phone number to reach the unit pharmacist ([place])   12/23/2017,8:57 PM

## 2017-12-23 NOTE — Consult Note (Addendum)
[place] Nurse wound consult note Patient evaluated in [facility] [phone].  No family present. Reason for Consult: Right foot wound Wound type: Neuropathic, DFU Injury POA: Yes Measurement:0.4 cm x 0.7 cm right 5th metatarsal head, plantar surface. Wound bed: 100 % clean, pink, no odor, no induration Drainage (amount, consistency, odor) Patient appeared to have an alginate over the wound bed, then a dry gauze, and these were secured with a film dressing. Periwound: Heavily callused.  Do not expect the wound to heal as long as the periwound is callused. Dressing procedure/placement/frequency: Cleanse area with normal saline. Pat dry.  Place a small piece of Aquacel Ag+ into the wound bed. Cover with dry gauze. Secure with a film dressing (Tegaderm). Change every other day. Monitor the wound area(s) for worsening of condition such as: Signs/symptoms of infection,  Increase in size,  Development of or worsening of odor, Development of pain, or increased pain at the affected locations.  Notify the medical team if any of these develop.  Thank you for the consult.  Discussed plan of care with the patient and bedside nurse.  [place] nurse will not follow at this time.  Please re-consult the [place] team if needed.  Val Riles, RN, MSN, CWOCN, CNS-BC, pager [phone]

## 2017-12-23 NOTE — Consult Note (Signed)
Subjective:   HPI  The patient is a 82 year old female who we are asked to see in regards to anemia and heme positive stool. She has multiple medical problems as noted and recently underwent a aortic valve replacement about a month and a half ago. She has been on Eliquis and states that she took a dose of this medication yesterday. She is currently on a heparin drip and the Eliquis was not given today. She reports that a couple of days ago her stools were dark in color. She has no complaints of abdominal pain. She had a colonoscopy in 2011 by Dr. Cristina Gong which showed only diverticulosis. An EGD at that time was normal. Patient denies hematemesis.  Review of Systems No chest pain or shortness of breath  Past Medical History:  Diagnosis Date  . Anemia   . CAD (coronary artery disease)    a. s/p CABG in 2000  . Carotid artery occlusion   . CKD (chronic kidney disease)   . Diastolic CHF ([place]) 43/3295  . Fibromyalgia   . GERD (gastroesophageal reflux disease)   . History of CVA (cerebrovascular accident) 05/24/2014  . History of GI bleed   . Hyperlipidemia   . Hypertension   . Obesity   . PAF (paroxysmal atrial fibrillation) ([facility])    a. on Eliquis  . Peripheral vascular disease ([place])   . 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 Type II diabetes mellitus ([place]) dx'd 1979   Past Surgical History:  Procedure Laterality Date  . ABDOMINAL AORTAGRAM N/A 04/01/2011   Procedure: ABDOMINAL AORTAGRAM;  Surgeon: Conrad Wyndmere, MD;  Location: [facility] CATH LAB;  Service: Cardiovascular;  Laterality: N/A;  . ANGIOPLASTY  06/17/11   Left leg common femoral artery cannulation under u/s Left leg runoff  . CARDIAC CATHETERIZATION  10/11/2017  . CARPAL TUNNEL RELEASE Right   . CATARACT EXTRACTION W/ INTRAOCULAR LENS  IMPLANT, BILATERAL  2004-2005  . CORONARY ARTERY BYPASS GRAFT  2000    CABG X5  . EYE SURGERY Left    "lasered before cataract OR"  . LOWER EXTREMITY ANGIOGRAM Bilateral 04/01/2011   Procedure: LOWER EXTREMITY ANGIOGRAM;  Surgeon: Conrad Lewiston, MD;  Location: [facility] CATH LAB;  Service: Cardiovascular;  Laterality: Bilateral;  . LOWER EXTREMITY ANGIOGRAM Left 06/17/2011   Procedure: LOWER EXTREMITY ANGIOGRAM;  Surgeon: Conrad [place], MD;  Location: [facility] CATH LAB;  Service: Cardiovascular;  Laterality: Left;  . LOWER EXTREMITY ANGIOGRAM N/A 11/18/2011   Procedure: LOWER EXTREMITY ANGIOGRAM;  Surgeon: Conrad Paisley, MD;  Location: [facility] CATH LAB;  Service: Cardiovascular;  Laterality: N/A;  . PERCUTANEOUS STENT INTERVENTION Right 04/01/2011   Procedure: PERCUTANEOUS STENT INTERVENTION;  Surgeon: Conrad Stacey Street, MD;  Location: [facility] CATH LAB;  Service: Cardiovascular;  Laterality: Right;  rt iliac stent  . PERIPHERAL ARTERIAL STENT GRAFT  04/01/11   right common iliac  . RIGHT/LEFT HEART CATH AND CORONARY/GRAFT ANGIOGRAPHY N/A 10/11/2017   Procedure: RIGHT/LEFT HEART CATH AND CORONARY/GRAFT ANGIOGRAPHY;  Surgeon: Sherren Mocha, MD;  Location: [place] CV LAB;  Service: Cardiovascular;  Laterality: N/A;  . TEE WITHOUT CARDIOVERSION N/A 11/08/2017   Procedure: TRANSESOPHAGEAL ECHOCARDIOGRAM (TEE);  Surgeon: Sherren Mocha, MD;  Location: [place];  Service: Open Heart Surgery;  Laterality: N/A;  . TEE WITHOUT CARDIOVERSION N/A 12/15/2017   Procedure: TRANSESOPHAGEAL ECHOCARDIOGRAM (TEE);  Surgeon: Larey Dresser, MD;  Location: [facility] ENDOSCOPY;  Service: Cardiovascular;  Laterality: N/A;  . TRANSCATHETER AORTIC VALVE REPLACEMENT, TRANSFEMORAL N/A 11/08/2017   Procedure: TRANSCATHETER AORTIC VALVE REPLACEMENT, TRANSFEMORAL;  Surgeon: Sherren Mocha, MD;  Location: [place];  Service: Open Heart Surgery;  Laterality: N/A;  . TRIGGER FINGER RELEASE Left 1996   thumb   Social History   Socioeconomic History  . Marital status: Divorced    Spouse name: Not on file  . Number of children: 3   . Years of education: 61  . Highest education level: Not on file  Occupational History  . Occupation: Retired  Scientific laboratory technician  . Financial resource strain: Not on file  . Food insecurity:    Worry: Not on file    Inability: Not on file  . Transportation needs:    Medical: Not on file    Non-medical: Not on file  Tobacco Use  . Smoking status: Former Smoker    Packs/day: 2.00    Years: 30.00    Pack years: 60.00    Types: Cigarettes    Last attempt to quit: 04/19/1990    Years since quitting: 27.6  . Smokeless tobacco: Never Used  . Tobacco comment: stopped smoking cigarettes 1991  Substance and Sexual Activity  . Alcohol use: Not Currently    Alcohol/week: 0.0 standard drinks    Comment: "tried different alcohols when I was 1st married; never drank much at  ALL"  . Drug use: Never  . Sexual activity: Not Currently  Lifestyle  . Physical activity:    Days per week: Not on file    Minutes per session: Not on file  . Stress: Not on file  Relationships  . Social connections:    Talks on phone: Not on file    Gets together: Not on file    Attends religious service: Not on file    Active member of club or organization: Not on file    Attends meetings of clubs or organizations: Not on file    Relationship status: Not on file  . Intimate partner violence:    Fear of current or ex partner: Not on file    Emotionally abused: Not on file    Physically abused: Not on file    Forced sexual activity: Not on file  Other Topics Concern  . Not on file  Social History Narrative   Divorced.  Native of [place].  Formerly worked as Education administrator person. 01/13/17 lives alone   Caffeine use: drinks decaf tea and coffee   family history includes Diabetes in her brother; Other in her brother.  Current Facility-Administered Medications:  .  0.9 %  sodium chloride infusion (Manually program via Guardrails IV Fluids), , Intravenous, Once, Abrol, Nayana, MD .  0.9 %  sodium chloride infusion, 250  mL, Intravenous, PRN, Purohit, Shrey C, MD .  acetaminophen (TYLENOL) tablet 650 mg, 650 mg, Oral, Q6H PRN **OR** acetaminophen (TYLENOL) suppository 650 mg, 650 mg, Rectal, Q6H PRN, Purohit, Shrey C, MD .  amLODipine (NORVASC) tablet 10 mg, 10 mg, Oral, Daily, Purohit, Shrey C, MD, 10 mg at 12/23/17 0901 .  aspirin chewable tablet 81 mg, 81 mg, Oral, QHS, Purohit, Shrey C, MD, 81 mg at 12/22/17 2322 .  atorvastatin (LIPITOR) tablet 80 mg, 80 mg, Oral, QHS, Purohit, Shrey C, MD, 80 mg at 12/22/17 2328 .  ferumoxytol (FERAHEME) 510 mg in sodium chloride 0.9 % 100 mL IVPB, 510 mg, Intravenous, Weekly, Abrol, Nayana, MD, Last Rate: 468 mL/hr at 12/23/17 0952, 510 mg at 12/23/17 0952 .  heparin ADULT infusion 100 units/mL (25000 units/269mL sodium chloride 0.45%), 1,050 Units/hr, Intravenous, Continuous, Pierce, Dwayne A, RPH .  insulin aspart (novoLOG) injection 0-5 Units, 0-5 Units, Subcutaneous, QHS, Purohit, Shrey C, MD, 2 Units at 12/22/17 2330 .  insulin aspart (novoLOG) injection 0-9 Units, 0-9 Units, Subcutaneous, TID WC, Purohit, Shrey C, MD, 3 Units at 12/23/17 0901 .  insulin NPH Human (HUMULIN N,NOVOLIN N) injection 15 Units, 15 Units, Subcutaneous, BID AC & HS, Purohit, Konrad Dolores, MD, 15 Units at 12/23/17 0902 .  losartan (COZAAR) tablet 50 mg, 50 mg, Oral, Daily, Purohit, Shrey C, MD, 50 mg at 12/23/17 0901 .  methocarbamol (ROBAXIN) tablet 500 mg, 500 mg, Oral, Q6H PRN, Jani Gravel, MD, 500 mg at 12/23/17 0309 .  metoprolol tartrate (LOPRESSOR) tablet 12.5 mg, 12.5 mg, Oral, BID, Purohit, Shrey C, MD, 12.5 mg at 12/23/17 0901 .  multivitamin with minerals tablet 1 tablet, 1 tablet, Oral, Daily, Purohit, Shrey C, MD, 1 tablet at 12/23/17 0901 .  nitroGLYCERIN (NITROSTAT) SL tablet 0.4 mg, 0.4 mg, Sublingual, Q5 min PRN, Purohit, Shrey C, MD .  ondansetron (ZOFRAN) tablet 4 mg, 4 mg, Oral, Q6H PRN **OR** ondansetron (ZOFRAN) injection 4 mg, 4 mg, Intravenous, Q6H PRN, Purohit, Shrey C, MD .   pantoprazole (PROTONIX) EC tablet 40 mg, 40 mg, Oral, BID, Purohit, Shrey C, MD, 40 mg at 12/23/17 0901 .  saccharomyces boulardii (FLORASTOR) capsule 250 mg, 250 mg, Oral, BID, Purohit, Shrey C, MD, 250 mg at 12/23/17 0901 .  senna-docusate (Senokot-S) tablet 1 tablet, 1 tablet, Oral, QHS PRN, Purohit, Shrey C, MD .  sodium chloride flush (NS) 0.9 % injection 10-40 mL, 10-40 mL, Intracatheter, PRN, Triadhosp, McAdmits, MD, 10 mL at 12/23/17 0319 .  sodium chloride flush (NS) 0.9 % injection 3 mL, 3 mL, Intravenous, Q12H, Purohit, Shrey C, MD, 3 mL at 12/23/17 0902 .  sodium chloride flush (NS) 0.9 % injection 3 mL, 3 mL, Intravenous, PRN, Purohit, Shrey C, MD .  vancomycin (VANCOCIN) IVPB 1000 mg/200 mL premix, 1,000 mg, Intravenous, Q24H, Whiteside, Colton Z, RPH, Last Rate: 200 mL/hr at 12/23/17 0637, 1,000 mg at 12/23/17 7408 Allergies  Allergen Reactions  . Morphine And Related Nausea And Vomiting and Other (See Comments)    Chest pain, also   . Lisinopril Cough  . Zoloft [Sertraline Hcl] Nausea Only          Objective:     BP (!) 150/51 (BP Location: Left Arm)   Pulse 97   Temp 98.1 F (36.7 C) (Oral)   Resp 17   Ht 5\' 6"  (1.676 m)   Wt 87.7 kg   SpO2 93%   BMI 31.21 kg/m   No acute distress  Nonicteric  Heart irregular rhythm  Lungs clear  Abdomen soft and nontender  Laboratory No components found for: D1    Assessment:     Multiple medical problems as noted.  Recent melena  Anemia and heme positive stool      Plan:     Acid suppression therapy for now. We will plan egd before discharge.follow H&H. Lab Results  Component Value Date   HGB 8.1 (L) 12/23/2017   HGB 7.4 (L) 12/23/2017   HGB 6.7 (LL) 12/22/2017   HGB 9.8 (L) 10/07/2017   HCT 25.9 (L) 12/23/2017   HCT 23.5 (L) 12/23/2017   HCT 22.0 (L) 12/22/2017   HCT 29.9 (L) 10/07/2017   ALKPHOS 114 12/22/2017   ALKPHOS 120 12/11/2017   ALKPHOS 114 11/04/2017  AST 29 12/22/2017   AST 37  12/11/2017   AST 38 11/04/2017   ALT 25 12/22/2017   ALT 20 12/11/2017   ALT 24 11/04/2017

## 2017-12-23 NOTE — Evaluation (Signed)
Physical Therapy Evaluation Patient Details Name: Kristina Summers MRN: [id number] DOB: 13-Oct-1935 Today's Date: 12/23/2017   History of Present Illness  Patient is an 82 y/o female presenting to the ED on 12/22/17 following abnormal labs with anemia. Low hemoglobin of 6.7 in the ED. PMH significant for coronary artery disease status post CABG in 2000 with left heart catheterization on 10/11/2017 showing most of CABGs patient, history of CVA, gated by slowed speech, critical aortic stenosis status post TAVR on 11/08/2017, hypertension, hyperlipidemia, peripheral vascular disease, diastolic heart failure, paroxysmal atrial fibrillation on apixaban, stage III CKD, type 2 diabetes who was recently admitted on 12/11/2017 and discharged on 12/16/2017 with MRSA bacteremia on IV vancomycin for 6 weeks.     Clinical Impression  Kristina Summers is a very pleasant 82 y/o female admitted with the above listed diagnosis. Patient reports that prior to admission she was Mod I with mobility with rollator. Patient today requiring general supervision to min guard level for transfers and mobility for safety. No reports of dizziness/lightheadedness with positional changes or mobility. PT to follow acutely to maximize safe and independent functional mobility.     Follow Up Recommendations Home health PT;Supervision - Intermittent    Equipment Recommendations  None recommended by PT    Recommendations for Other Services       Precautions / Restrictions Precautions Precautions: Fall;Other (comment) Precaution Comments: visual deficits at baseline Restrictions Weight Bearing Restrictions: No      Mobility  Bed Mobility Overal bed mobility: Modified Independent Bed Mobility: Supine to Sit              Transfers Overall transfer level: Needs assistance Equipment used: Rolling walker (2 wheeled) Transfers: Sit to/from Stand Sit to Stand: Supervision         General transfer comment: supervision for general  safety  Ambulation/Gait Ambulation/Gait assistance: Min guard Gait Distance (Feet): 20 Feet(able to do greater distances, however, wanting to eat lunch) Assistive device: Rolling walker (2 wheeled) Gait Pattern/deviations: Step-through pattern;Decreased stride length;Trunk flexed Gait velocity: decreased   General Gait Details: cueing for safety and obstacle navigation  Stairs            Wheelchair Mobility    Modified Rankin (Stroke Patients Only)       Balance Overall balance assessment: Needs assistance Sitting-balance support: No upper extremity supported;Feet supported Sitting balance-Leahy Scale: Good Sitting balance - Comments: sitting EOB for ~5 min with Mod I   Standing balance support: Bilateral upper extremity supported;During functional activity Standing balance-Leahy Scale: Fair                               Pertinent Vitals/Pain Pain Assessment: No/denies pain    Home Living Family/patient expects to be discharged to:: Private residence Living Arrangements: Alone Available Help at Discharge: Family;Available PRN/intermittently Type of Home: Apartment Home Access: Stairs to enter Entrance Stairs-Rails: None Entrance Stairs-Number of Steps: 1 Home Layout: One level Home Equipment: Walker - 4 wheels;Cane - single point;Tub bench      Prior Function Level of Independence: Independent with assistive device(s);Needs assistance   Gait / Transfers Assistance Needed: uses rollator  ADL's / Homemaking Assistance Needed: daughter gets her groceries  Comments: pt walks with rollator in home and cane at times if daughter is with her     Hand Dominance   Dominant Hand: Right    Extremity/Trunk Assessment        Lower Extremity Assessment Lower  Extremity Assessment: Generalized weakness    Cervical / Trunk Assessment Cervical / Trunk Assessment: Kyphotic  Communication   Communication: No difficulties  Cognition  Arousal/Alertness: Awake/alert Behavior During Therapy: WFL for tasks assessed/performed Overall Cognitive Status: Within Functional Limits for tasks assessed                                        General Comments      Exercises     Assessment/Plan    PT Assessment Patient needs continued PT services  PT Problem List Decreased strength;Decreased mobility;Decreased activity tolerance;Decreased balance;Decreased knowledge of use of DME;Decreased knowledge of precautions;Decreased safety awareness       PT Treatment Interventions DME instruction;Therapeutic activities;Gait training;Therapeutic exercise;Patient/family education;Balance training;Functional mobility training    PT Goals (Current goals can be found in the Care Plan section)  Acute Rehab PT Goals Patient Stated Goal: return home PT Goal Formulation: With patient Time For Goal Achievement: 01/06/18 Potential to Achieve Goals: Fair    Frequency Min 3X/week   Barriers to discharge        Co-evaluation               AM-PAC PT "6 Clicks" Daily Activity  Outcome Measure Difficulty turning over in bed (including adjusting bedclothes, sheets and blankets)?: None Difficulty moving from lying on back to sitting on the side of the bed? : A Little Difficulty sitting down on and standing up from a chair with arms (e.g., wheelchair, bedside commode, etc,.)?: A Little Help needed moving to and from a bed to chair (including a wheelchair)?: A Little Help needed walking in hospital room?: A Little Help needed climbing 3-5 steps with a railing? : A Little 6 Click Score: 19    End of Session Equipment Utilized During Treatment: Gait belt Activity Tolerance: Patient tolerated treatment well Patient left: in chair;with call bell/phone within reach;with chair alarm set Nurse Communication: Mobility status PT Visit Diagnosis: Other abnormalities of gait and mobility (R26.89);Muscle weakness (generalized)  (M62.81)    Time: 9935-7017 PT Time Calculation (min) (ACUTE ONLY): 28 min   Charges:   PT Evaluation $PT Eval Moderate Complexity: 1 Mod PT Treatments $Therapeutic Activity: 8-22 mins        Lanney Gins, PT, DPT 12/23/17 3:03 PM Pager: [phone]

## 2017-12-24 DIAGNOSIS — D62 Acute posthemorrhagic anemia: Secondary | ICD-10-CM

## 2017-12-24 LAB — CBC
HCT: 25.8 % — ABNORMAL LOW (ref 36.0–46.0)
Hemoglobin: 8 g/dL — ABNORMAL LOW (ref 12.0–15.0)
MCH: 27.9 pg (ref 26.0–34.0)
MCHC: 31 g/dL (ref 30.0–36.0)
MCV: 89.9 fL (ref 78.0–100.0)
Platelets: 287 10*3/uL (ref 150–400)
RBC: 2.87 MIL/uL — ABNORMAL LOW (ref 3.87–5.11)
RDW: 15.8 % — ABNORMAL HIGH (ref 11.5–15.5)
WBC: 7.4 10*3/uL (ref 4.0–10.5)

## 2017-12-24 LAB — COMPREHENSIVE METABOLIC PANEL
ALBUMIN: 1.9 g/dL — AB (ref 3.5–5.0)
ALK PHOS: 98 U/L (ref 38–126)
ALT: 19 U/L (ref 0–44)
AST: 27 U/L (ref 15–41)
Anion gap: 7 (ref 5–15)
BILIRUBIN TOTAL: 1 mg/dL (ref 0.3–1.2)
BUN: 14 mg/dL (ref 8–23)
CALCIUM: 8.6 mg/dL — AB (ref 8.9–10.3)
CO2: 25 mmol/L (ref 22–32)
Chloride: 108 mmol/L (ref 98–111)
Creatinine, Ser: 1.08 mg/dL — ABNORMAL HIGH (ref 0.44–1.00)
GFR calc Af Amer: 55 mL/min — ABNORMAL LOW (ref >60)
GFR, EST NON AFRICAN AMERICAN: 47 mL/min — AB (ref >60)
GLUCOSE: 73 mg/dL (ref 70–99)
Potassium: 3.6 mmol/L (ref 3.5–5.1)
Sodium: 140 mmol/L (ref 135–145)
TOTAL PROTEIN: 5.9 g/dL — AB (ref 6.5–8.1)

## 2017-12-24 LAB — GLUCOSE, CAPILLARY
Glucose-Capillary: 74 mg/dL (ref 70–99)
Glucose-Capillary: 125 mg/dL — ABNORMAL HIGH (ref 70–99)
Glucose-Capillary: 115 mg/dL — ABNORMAL HIGH (ref 70–99)
Glucose-Capillary: 92 mg/dL (ref 70–99)

## 2017-12-24 LAB — APTT
APTT: 81 s — AB (ref 24–36)
aPTT: 110 seconds — ABNORMAL HIGH (ref 24–36)
aPTT: 99 seconds — ABNORMAL HIGH (ref 24–36)

## 2017-12-24 MED ORDER — SODIUM CHLORIDE 0.9 % IV SOLN
INTRAVENOUS | Status: DC
  Administered 2017-12-24: 22:00:00 via INTRAVENOUS

## 2017-12-24 MED ORDER — HEPARIN (PORCINE) IN NACL 100-0.45 UNIT/ML-% IJ SOLN
800.0000 [IU]/h | INTRAMUSCULAR | Status: AC
  Administered 2017-12-25: 800 [IU]/h via INTRAVENOUS
  Filled 2017-12-24: qty 250

## 2017-12-24 NOTE — Progress Notes (Signed)
Heparin now running at 8 mL/hr, no bleeding,bruising/hematoma noted. Will continue to monitor.

## 2017-12-24 NOTE — Progress Notes (Signed)
ANTICOAGULATION CONSULT NOTE - Follow Up Consult  Pharmacy Consult for heparin Indication: atrial fibrillation  Labs: Recent Labs    12/22/17 1533  12/23/17 0220 12/23/17 0754 12/23/17 0915 12/23/17 1915 12/24/17 0402  HGB 6.7*  --  7.4*  --  8.1* 8.4* 8.0*  HCT 22.0*  --  23.5*  --  25.9* 27.1* 25.8*  PLT 309  --  284  --   --   --  287  APTT  --    < > 69* 119*  --  106* 110*  LABPROT 20.4*  --   --   --   --   --   --   INR 1.76  --   --   --   --   --   --   HEPARINUNFRC  --   --  >2.20*  --   --   --   --   CREATININE 1.32*  --  1.12*  --   --   --  1.08*   < > = values in this interval not displayed.    Assessment: 82yo female supratherapeutic on heparin with higher aPTT despite rate decreases; no signs of bleeding per RN.  Goal of Therapy:  Heparin level 66-102 units/ml   Plan:  Will decrease heparin gtt by 2 units/kg/hr to 800 units/hr and check PTT in 8 hours.    Wynona Neat, PharmD, BCPS  12/24/2017,6:36 AM

## 2017-12-24 NOTE — Progress Notes (Signed)
No complaints. Will plan EGD for tomorrow morning.

## 2017-12-24 NOTE — Progress Notes (Addendum)
[place] for heparin Indication: atrial fibrillation  Allergies  Allergen Reactions  . Morphine And Related Nausea And Vomiting and Other (See Comments)    Chest pain, also   . Lisinopril Cough  . Zoloft [Sertraline Hcl] Nausea Only         Patient Measurements: Height: 5\' 6"  (167.6 cm) Weight: 193 lb 5.5 oz (87.7 kg) IBW/kg (Calculated) : 59.3 Heparin Dosing Weight: 78  Vital Signs: Temp: 97.8 F (36.6 C) (09/07 1448) Temp Source: Oral (09/07 1448) BP: 135/42 (09/07 1448) Pulse Rate: 65 (09/07 1448)  Labs: Recent Labs    12/22/17 1533 12/23/17 0220  12/23/17 0915 12/23/17 1915 12/24/17 0402 12/24/17 1441  HGB 6.7* 7.4*  --  8.1* 8.4* 8.0*  --   HCT 22.0* 23.5*  --  25.9* 27.1* 25.8*  --   PLT 309 284  --   --   --  287  --   APTT  --  69*   < >  --  106* 110* 81*  LABPROT 20.4*  --   --   --   --   --   --   INR 1.76  --   --   --   --   --   --   HEPARINUNFRC  --  >2.20*  --   --   --   --   --   CREATININE 1.32* 1.12*  --   --   --  1.08*  --    < > = values in this interval not displayed.    Estimated Creatinine Clearance: 46.4 mL/min (A) (by C-G formula based on SCr of 1.08 mg/dL (H)).    Assessment: 82 yo female with h/o afib on apixaban PTA. Admitted for anemia with possible GI bleed.  Given that patient is being transitioned from apixaban to heparin, we will need to draw aPTT and heparin levels due to apixaban's effect on heparin levels.   PTT remains therapeutic  Goal of Therapy:  aPTT 66-102 seconds  Heparin level 0.3-0.7 Monitor platelets by anticoagulation protocol: Yes   Plan:  Continue heparin at 800 units / hr Daily heparin level and aPTT until heparin level correlates with aPTT.  Monitor for s/sx of further bleeding.   Thanks for allowing pharmacy to be a part of this patient's care.  Excell Seltzer, PharmD Clinical Pharmacist 12/24/2017,3:13 PM

## 2017-12-24 NOTE — Progress Notes (Signed)
PROGRESS NOTE    Kristina Summers  BMW:[id number] DOB: [birth year] DOA: 12/22/2017 PCP: [facility], [facility]   Brief Narrative: Patient is a 82 y.o.femalewith medical history significant ofcoronary artery disease status post CABG in 2000 with left heart catheterization on 10/11/2017 showing most of CABGs patient, history of CVA, gated by slowed speech, critical aortic stenosis status post TAVR on 11/08/2017, hypertension, hyperlipidemia, peripheral vascular disease, diastolic heart failure, paroxysmal atrial fibrillation on apixaban, stage III CKD, type 2 diabetes who was recently admitted on 12/11/2017 and discharged on 12/16/2017 with MRSA bacteremia on IV vancomycin for 6 weeks who presents because her weekly lab draws were noted to be abnormal with anemia. Patient herself denies noticing any GI bleeding or melena. She reports occasionally being slightly lightheaded but denies any chest pain, shortness of breath, nausea, vomiting, diarrhea. She reports a remote history of an endoscopy which reportedly was in the setting of GI bleeding with an elevated INR however no source was found. She also reports a colonoscopy 5 to 6 years ago by Dr. Cristina Gong. Of note on discharge her discharge hemoglobin was 8.5 and had been trending down during her prior admission.  Patient has been admitted for further workup of her anemia,  Assessment & Plan:   Active Problems:   CAD (coronary artery disease)   Paroxysmal atrial fibrillation   Hyperlipidemia   S/P CABG (coronary artery bypass graft)   History of GI bleed   Peripheral vascular disease, unspecified (HCC)   Type II diabetes mellitus with renal manifestations (HCC)   Severe aortic stenosis   S/P TAVR (transcatheter aortic valve replacement)   CKD (chronic kidney disease), stage III (HCC)   UTI (urinary tract infection)   MRSA bacteremia   Acute blood loss anemia   Acute blood loss anemia: Hemoglobin this morning was 8.  She is status post 2 units of  PRBC.  Currently hemodynamically stable.  Continue PPI.  GI following and planning for EGD tomorrow.  Continue clear liquid diet.  Status post TAVR/chronic atrial fibrillation: Was on Eliquis at home which is on hold.  Continue on heparin drip for now. History of critical aortic stenosis.  Underwent TAVR procedure on 11/07/2017.  MRSA bacteremia: Recently admitted and discharged for MRSA bacteremia.  Plan is to continue 6 weeks of IV vancomycin due to MRSA bacteremia in the setting of TAVR.  Continue vancomycin 750 mg daily till 01/26/2018.  Pharmacy following for troughs.  Coronary disease: Status post CABG.  Continue aspirin, Lipitor  History of hypertension: Currently blood pressure stable.  Continue her home regimen.  On metoprolol and losartan and amlodipine.  Hyperlipidemia: Continue Lipitor  Chronic diastolic heart failure: On Lasix at home.  Currently on hold.  Type 2 diabetes mellitus: Continue sliding-scale insulin here.  History of CVA: On aspirin.  Stage III CKD: Currently stable.   DVT prophylaxis: Heparin Drip Code Status: Full Family Communication: None present at the bedside Disposition Plan: Pending work-up   Consultants: GI  Procedures: None  Antimicrobials: Vancomycin  Subjective: Patient seen and examined the bedside this morning.  Remains comfortable.  No new issues/events.  Hemodynamically stable.  No  episodes of rectal bleeding, coffee-ground emesis.  Objective: Vitals:   12/23/17 1637 12/23/17 1721 12/23/17 2150 12/24/17 0607  BP: 140/65 (!) 157/54 (!) 141/78 (!) 125/56  Pulse: 67 75 63 63  Resp:   18 18  Temp: 97.7 F (36.5 C) 97.7 F (36.5 C) 98.2 F (36.8 C)   TempSrc: Oral Oral Oral   SpO2:  97% 97% 94% 92%  Weight:      Height:        Intake/Output Summary (Last 24 hours) at 12/24/2017 0945 Last data filed at 12/24/2017 0700 Gross per 24 hour  Intake 830.53 ml  Output -  Net 830.53 ml   Filed Weights   12/22/17 1513 12/22/17 2005    Weight: 89.8 kg 87.7 kg    Examination:  General exam: Appears calm and comfortable ,Not in distress,obese HEENT:PERRL,Oral mucosa moist, Ear/Nose normal on gross exam Respiratory system: Bilateral equal air entry, normal vesicular breath sounds, no wheezes or crackles  Cardiovascular system: S1 & S2 heard, RRR. No JVD, murmurs, rubs, gallops or clicks. No pedal edema. Gastrointestinal system: Abdomen is nondistended, soft and nontender. No organomegaly or masses felt. Normal bowel sounds heard. Central nervous system: Alert and oriented. No focal neurological deficits. Extremities: No edema, no clubbing ,no cyanosis, distal peripheral pulses palpable. Skin: No rashes, lesions or ulcers,no icterus ,no pallor MSK: Normal muscle bulk,tone ,power Psychiatry: Judgement and insight appear normal. Mood & affect appropriate.     Data Reviewed: I have personally reviewed following labs and imaging studies  CBC: Recent Labs  Lab 12/22/17 1533 12/23/17 0220 12/23/17 0915 12/23/17 1915 12/24/17 0402  WBC 7.5 8.6  --   --  7.4  NEUTROABS 5.5  --   --   --   --   HGB 6.7* 7.4* 8.1* 8.4* 8.0*  HCT 22.0* 23.5* 25.9* 27.1* 25.8*  MCV 91.7 88.7  --   --  89.9  PLT 309 284  --   --  284   Basic Metabolic Panel: Recent Labs  Lab 12/22/17 1533 12/23/17 0220 12/24/17 0402  NA 136 137 140  K 4.2 4.0 3.6  CL 101 105 108  CO2 24 25 25   GLUCOSE 276* 200* 73  BUN 25* 21 14  CREATININE 1.32* 1.12* 1.08*  CALCIUM 9.5 9.0 8.6*   GFR: Estimated Creatinine Clearance: 46.4 mL/min (A) (by C-G formula based on SCr of 1.08 mg/dL (H)). Liver Function Tests: Recent Labs  Lab 12/22/17 1533 12/24/17 0402  AST 29 27  ALT 25 19  ALKPHOS 114 98  BILITOT 0.7 1.0  PROT 6.9 5.9*  ALBUMIN 2.3* 1.9*   No results for input(s): LIPASE, AMYLASE in the last 168 hours. No results for input(s): AMMONIA in the last 168 hours. Coagulation Profile: Recent Labs  Lab 12/22/17 1533  INR 1.76   Cardiac  Enzymes: No results for input(s): CKTOTAL, CKMB, CKMBINDEX, TROPONINI in the last 168 hours. BNP (last 3 results) No results for input(s): PROBNP in the last 8760 hours. HbA1C: No results for input(s): HGBA1C in the last 72 hours. CBG: Recent Labs  Lab 12/23/17 0737 12/23/17 1218 12/23/17 1744 12/23/17 2157 12/24/17 0812  GLUCAP 216* 162* 157* 108* 74   Lipid Profile: No results for input(s): CHOL, HDL, LDLCALC, TRIG, CHOLHDL, LDLDIRECT in the last 72 hours. Thyroid Function Tests: No results for input(s): TSH, T4TOTAL, FREET4, T3FREE, THYROIDAB in the last 72 hours. Anemia Panel: Recent Labs    12/23/17 0220  VITAMINB12 755  FERRITIN 42  TIBC 291  IRON 14*  RETICCTPCT 3.4*   Sepsis Labs: No results for input(s): PROCALCITON, LATICACIDVEN in the last 168 hours.  Recent Results (from the past 240 hour(s))  MRSA PCR Screening     Status: None   Collection Time: 12/23/17  2:02 PM  Result Value Ref Range Status   MRSA by PCR NEGATIVE NEGATIVE Final  Comment:        The GeneXpert MRSA Assay (FDA approved for NASAL specimens only), is one component of a comprehensive MRSA colonization surveillance program. It is not intended to diagnose MRSA infection nor to guide or monitor treatment for MRSA infections. Performed at [facility], [place] [address] [place], [place] [address]          Radiology Studies: No results found.      Scheduled Meds: . amLODipine  10 mg Oral Daily  . aspirin  81 mg Oral QHS  . atorvastatin  80 mg Oral QHS  . insulin aspart  0-5 Units Subcutaneous QHS  . insulin aspart  0-9 Units Subcutaneous TID WC  . insulin NPH Human  15 Units Subcutaneous BID AC & HS  . losartan  50 mg Oral Daily  . metoprolol tartrate  12.5 mg Oral BID  . multivitamin with minerals  1 tablet Oral Daily  . pantoprazole  40 mg Oral BID  . saccharomyces boulardii  250 mg Oral BID  . sodium chloride flush  3 mL Intravenous Q12H   Continuous  Infusions: . sodium chloride    . ferumoxytol Stopped (12/23/17 1007)  . heparin 800 Units/hr (12/24/17 0646)  . vancomycin 1,000 mg (12/24/17 0545)     LOS: 2 days    Time spent: 25 mins.More than 50% of that time was spent in counseling and/or coordination of care.      Shelly Coss, MD [facility] Pager [phone]  If 7PM-7AM, please contact night-coverage [email or web address] Password [email or web address] 12/24/2017, 9:45 AM

## 2017-12-25 ENCOUNTER — Inpatient Hospital Stay (HOSPITAL_COMMUNITY): Payer: Medicare Other | Admitting: Certified Registered Nurse Anesthetist

## 2017-12-25 ENCOUNTER — Encounter (HOSPITAL_COMMUNITY): Payer: Self-pay | Admitting: Certified Registered Nurse Anesthetist

## 2017-12-25 ENCOUNTER — Encounter (HOSPITAL_COMMUNITY)
Admission: EM | Disposition: A | Discharge status: Home Health | Payer: Self-pay | Source: Home / Self Care | Attending: Internal Medicine

## 2017-12-25 HISTORY — PX: ESOPHAGOGASTRODUODENOSCOPY (EGD) WITH PROPOFOL: SHX5813

## 2017-12-25 LAB — CBC
HCT: 25.8 % — ABNORMAL LOW (ref 36.0–46.0)
Hemoglobin: 7.8 g/dL — ABNORMAL LOW (ref 12.0–15.0)
MCH: 27.8 pg (ref 26.0–34.0)
MCHC: 30.2 g/dL (ref 30.0–36.0)
MCV: 91.8 fL (ref 78.0–100.0)
Platelets: 285 K/uL (ref 150–400)
RBC: 2.81 MIL/uL — ABNORMAL LOW (ref 3.87–5.11)
RDW: 15.9 % — ABNORMAL HIGH (ref 11.5–15.5)
WBC: 7.6 10*3/uL (ref 4.0–10.5)

## 2017-12-25 LAB — HEPARIN LEVEL (UNFRACTIONATED): Heparin Unfractionated: 1.82 IU/mL — ABNORMAL HIGH (ref 0.30–0.70)

## 2017-12-25 LAB — GLUCOSE, CAPILLARY
Glucose-Capillary: 75 mg/dL (ref 70–99)
Glucose-Capillary: 90 mg/dL (ref 70–99)
Glucose-Capillary: 74 mg/dL (ref 70–99)
Glucose-Capillary: 145 mg/dL — ABNORMAL HIGH (ref 70–99)
Glucose-Capillary: 237 mg/dL — ABNORMAL HIGH (ref 70–99)

## 2017-12-25 LAB — VANCOMYCIN, TROUGH: VANCOMYCIN TR: 15 ug/mL (ref 15–20)

## 2017-12-25 LAB — APTT: aPTT: 102 s — ABNORMAL HIGH (ref 24–36)

## 2017-12-25 LAB — PREPARE RBC (CROSSMATCH)

## 2017-12-25 SURGERY — ESOPHAGOGASTRODUODENOSCOPY (EGD) WITH PROPOFOL
Anesthesia: Monitor Anesthesia Care

## 2017-12-25 MED ORDER — PROPOFOL 10 MG/ML IV BOLUS
INTRAVENOUS | Status: DC | PRN
  Administered 2017-12-25 (×2): 10 mg via INTRAVENOUS

## 2017-12-25 MED ORDER — HEPARIN (PORCINE) IN NACL 100-0.45 UNIT/ML-% IJ SOLN: 800.0000 [IU]/h | INTRAMUSCULAR | Status: AC

## 2017-12-25 MED ORDER — SODIUM CHLORIDE 0.9% IV SOLUTION
Freq: Once | INTRAVENOUS | Status: AC
  Administered 2017-12-25: 14:00:00 via INTRAVENOUS

## 2017-12-25 MED ORDER — PROPOFOL 500 MG/50ML IV EMUL
INTRAVENOUS | Status: DC | PRN
  Administered 2017-12-25: 80 ug/kg/min via INTRAVENOUS

## 2017-12-25 MED ORDER — LACTATED RINGERS IV SOLN
INTRAVENOUS | Status: DC
  Administered 2017-12-25: 11:00:00 via INTRAVENOUS
  Administered 2017-12-25: 1000 mL via INTRAVENOUS

## 2017-12-25 SURGICAL SUPPLY — 15 items

## 2017-12-25 NOTE — Progress Notes (Signed)
PROGRESS NOTE    Kristina Summers  ZDG:[id number] DOB: 11/17/1935 DOA: 12/22/2017 PCP: [facility]   Brief Narrative: Patient is a 82 y.o.femalewith medical history significant ofcoronary artery disease status post CABG in 2000 with left heart catheterization on 10/11/2017 showing most of CABGs patient, history of CVA, gated by slowed speech, critical aortic stenosis status post TAVR on 11/08/2017, hypertension, hyperlipidemia, peripheral vascular disease, diastolic heart failure, paroxysmal atrial fibrillation on apixaban, stage III CKD, type 2 diabetes who was recently admitted on 12/11/2017 and discharged on 12/16/2017 with MRSA bacteremia on IV vancomycin for 6 weeks who presents because her weekly lab draws were noted to be abnormal with anemia. Patient herself denies noticing any GI bleeding or melena. She reports occasionally being slightly lightheaded but denies any chest pain, shortness of breath, nausea, vomiting, diarrhea. She reports a remote history of an endoscopy which reportedly was in the setting of GI bleeding with an elevated INR however no source was found. She also reports a colonoscopy 5 to 6 years ago by Dr. Cristina Gong. Of note on discharge her discharge hemoglobin was 8.5 and had been trending down during her prior admission. Patient has been admitted for further workup of her anemia.  She was evaluated  by GI and she underwent EGD today.  Assessment & Plan:   Active Problems:   CAD (coronary artery disease)   Paroxysmal atrial fibrillation   Hyperlipidemia   S/P CABG (coronary artery bypass graft)   History of GI bleed   Peripheral vascular disease, unspecified (HCC)   Type II diabetes mellitus with renal manifestations (HCC)   Severe aortic stenosis   S/P TAVR (transcatheter aortic valve replacement)   CKD (chronic kidney disease), stage III (HCC)   UTI (urinary tract infection)   MRSA bacteremia   Acute blood loss anemia   Acute blood loss anemia: Hemoglobin  this morning was 7.8.  Will use her today with 1 more unit.  She is status post 2 units of PRBC already.  Currently hemodynamically stable.  Continue PPI.  GI following and she underwent  EGD today which did not show any area of bleed. GI recommended to restart Eliquis in a couple of days.  Status post TAVR/chronic atrial fibrillation: Was on Eliquis at home which is on hold.  Continue on heparin drip for now.  Will resume Eliquis on discharge. History of critical aortic stenosis.  Underwent TAVR procedure on 11/07/2017.  MRSA bacteremia: Recently admitted and discharged for MRSA bacteremia.  Plan is to continue 6 weeks of IV vancomycin due to MRSA bacteremia in the setting of TAVR.  Continue vancomycin 750 mg daily till 01/26/2018.  Pharmacy following for troughs.  Coronary disease: Status post CABG.  Continue aspirin, Lipitor  History of hypertension: Currently blood pressure stable.  Continue her home regimen.  On metoprolol and losartan and amlodipine.  Hyperlipidemia: Continue Lipitor  Chronic diastolic heart failure: On Lasix at home.  Currently on hold.  Type 2 diabetes mellitus: Continue sliding-scale insulin here.  History of CVA: On aspirin.  Stage III CKD: Currently stable.   DVT prophylaxis: Heparin Drip Code Status: Full Family Communication: None present at the bedside Disposition Plan: Likely home tomorrow.   Consultants: GI  Procedures: None  Antimicrobials: Vancomycin  Subjective: Patient seen and examined the bedside this morning.  Remains comfortable.  No new issues/events.  Hemodynamically stable.  No  episodes of rectal bleeding, coffee-ground emesis.  Underwent EGD today which was largely unremarkable.  Objective: Vitals:   12/25/17 1104  12/25/17 1144 12/25/17 1154 12/25/17 1204  BP: (!) 165/55 (!) 149/69 (!) 155/72 (!) 161/89  Pulse: 76 68 65 63  Resp: 14 18 19 19   Temp: 97.8 F (36.6 C) 97.7 F (36.5 C)    TempSrc: Oral Oral    SpO2: 96% 100% 99%  98%  Weight: 87.7 kg     Height: 5\' 6"  (1.676 m)       Intake/Output Summary (Last 24 hours) at 12/25/2017 1232 Last data filed at 12/25/2017 1139 Gross per 24 hour  Intake 1151.6 ml  Output 350 ml  Net 801.6 ml   Filed Weights   12/22/17 1513 12/22/17 2005 12/25/17 1104  Weight: 89.8 kg 87.7 kg 87.7 kg    Examination:  General exam: Appears calm and comfortable ,Not in distress,average built HEENT:PERRL,Oral mucosa moist, Ear/Nose normal on gross exam Respiratory system: Bilateral equal air entry, normal vesicular breath sounds, no wheezes or crackles  Cardiovascular system: S1 & S2 heard, RRR. No JVD, murmurs, rubs, gallops or clicks. Gastrointestinal system: Abdomen is nondistended, soft and nontender. No organomegaly or masses felt. Normal bowel sounds heard. Central nervous system: Alert and oriented. No focal neurological deficits. Extremities: No edema, no clubbing ,no cyanosis, distal peripheral pulses palpable. Skin: No rashes, lesions or ulcers,no icterus ,no pallor MSK: Normal muscle bulk,tone ,power Psychiatry: Judgement and insight appear normal. Mood & affect appropriate.      Data Reviewed: I have personally reviewed following labs and imaging studies  CBC: Recent Labs  Lab 12/22/17 1533 12/23/17 0220 12/23/17 0915 12/23/17 1915 12/24/17 0402 12/25/17 0536  WBC 7.5 8.6  --   --  7.4 7.6  NEUTROABS 5.5  --   --   --   --   --   HGB 6.7* 7.4* 8.1* 8.4* 8.0* 7.8*  HCT 22.0* 23.5* 25.9* 27.1* 25.8* 25.8*  MCV 91.7 88.7  --   --  89.9 91.8  PLT 309 284  --   --  287 665   Basic Metabolic Panel: Recent Labs  Lab 12/22/17 1533 12/23/17 0220 12/24/17 0402  NA 136 137 140  K 4.2 4.0 3.6  CL 101 105 108  CO2 24 25 25   GLUCOSE 276* 200* 73  BUN 25* 21 14  CREATININE 1.32* 1.12* 1.08*  CALCIUM 9.5 9.0 8.6*   GFR: Estimated Creatinine Clearance: 46.4 mL/min (A) (by C-G formula based on SCr of 1.08 mg/dL (H)). Liver Function Tests: Recent Labs  Lab  12/22/17 1533 12/24/17 0402  AST 29 27  ALT 25 19  ALKPHOS 114 98  BILITOT 0.7 1.0  PROT 6.9 5.9*  ALBUMIN 2.3* 1.9*   No results for input(s): LIPASE, AMYLASE in the last 168 hours. No results for input(s): AMMONIA in the last 168 hours. Coagulation Profile: Recent Labs  Lab 12/22/17 1533  INR 1.76   Cardiac Enzymes: No results for input(s): CKTOTAL, CKMB, CKMBINDEX, TROPONINI in the last 168 hours. BNP (last 3 results) No results for input(s): PROBNP in the last 8760 hours. HbA1C: No results for input(s): HGBA1C in the last 72 hours. CBG: Recent Labs  Lab 12/24/17 1639 12/24/17 2204 12/25/17 0751 12/25/17 1111 12/25/17 1218  GLUCAP 115* 92 75 90 74   Lipid Profile: No results for input(s): CHOL, HDL, LDLCALC, TRIG, CHOLHDL, LDLDIRECT in the last 72 hours. Thyroid Function Tests: No results for input(s): TSH, T4TOTAL, FREET4, T3FREE, THYROIDAB in the last 72 hours. Anemia Panel: Recent Labs    12/23/17 0220  [id number] 390  ZESPQZRA 07  TIBC 291  IRON 14*  RETICCTPCT 3.4*   Sepsis Labs: No results for input(s): PROCALCITON, LATICACIDVEN in the last 168 hours.  Recent Results (from the past 240 hour(s))  MRSA PCR Screening     Status: None   Collection Time: 12/23/17  2:02 PM  Result Value Ref Range Status   MRSA by PCR NEGATIVE NEGATIVE Final    Comment:        The GeneXpert MRSA Assay (FDA approved for NASAL specimens only), is one component of a comprehensive MRSA colonization surveillance program. It is not intended to diagnose MRSA infection nor to guide or monitor treatment for MRSA infections. Performed at [facility], [place] [address] [place], [place] [address]          Radiology Studies: No results found.      Scheduled Meds: . sodium chloride   Intravenous Once  . amLODipine  10 mg Oral Daily  . aspirin  81 mg Oral QHS  . atorvastatin  80 mg Oral QHS  . insulin aspart  0-5 Units Subcutaneous QHS  . insulin aspart   0-9 Units Subcutaneous TID WC  . insulin NPH Human  15 Units Subcutaneous BID AC & HS  . losartan  50 mg Oral Daily  . metoprolol tartrate  12.5 mg Oral BID  . multivitamin with minerals  1 tablet Oral Daily  . pantoprazole  40 mg Oral BID  . saccharomyces boulardii  250 mg Oral BID  . sodium chloride flush  3 mL Intravenous Q12H   Continuous Infusions: . sodium chloride    . ferumoxytol Stopped (12/23/17 1007)  . vancomycin 1,000 mg (12/25/17 0541)     LOS: 3 days    Time spent: 25 mins.More than 50% of that time was spent in counseling and/or coordination of care.      Shelly Coss, MD [facility] Pager [phone]  If 7PM-7AM, please contact night-coverage [email or web address] Password [email or web address] 12/25/2017, 12:32 PM

## 2017-12-25 NOTE — Op Note (Signed)
[facility] Patient Name: Kristina Summers Procedure Date : 12/25/2017 MRN: [id number] Attending MD: Wonda Horner , MD Date of Birth: [birth year] CSN: [id number] Age: 82 Admit Type: Inpatient Procedure:                Upper GI endoscopy Indications:              Heme positive stool, Melena Providers:                Wonda Horner, MD, Vista Lawman, RN, Cherylynn Ridges, Technician, Clearnce Sorrel, CRNA Referring MD:              Medicines:                Propofol per Anesthesia Complications:            No immediate complications. Estimated Blood Loss:     Estimated blood loss: none. Procedure:                Pre-Anesthesia Assessment:                           - Prior to the procedure, a History and Physical                            was performed, and patient medications and                            allergies were reviewed. The patient's tolerance of                            previous anesthesia was also reviewed. The risks                            and benefits of the procedure and the sedation                            options and risks were discussed with the patient.                            All questions were answered, and informed consent                            was obtained. Prior Anticoagulants: The patient has                            taken Eliquis (apixaban), last dose was 3 days                            prior to procedure. ASA Grade Assessment: III - A                            patient with severe systemic disease. After  reviewing the risks and benefits, the patient was                            deemed in satisfactory condition to undergo the                            procedure.                           - Prior to the procedure, a History and Physical                            was performed, and patient medications and                            allergies were reviewed. The patient's tolerance of                             previous anesthesia was also reviewed. The risks                            and benefits of the procedure and the sedation                            options and risks were discussed with the patient.                            All questions were answered, and informed consent                            was obtained. Prior Anticoagulants: The patient has                            taken heparin, held 3 hours before EGD. After                            reviewing the risks and benefits, the patient was                            deemed in satisfactory condition to undergo the                            procedure.                           After obtaining informed consent, the endoscope was                            passed under direct vision. Throughout the                            procedure, the patient's blood pressure, pulse, and  oxygen saturations were monitored continuously. The                            [id number]-H190 ([id number]) Olympus Adult EGD was introduced                            through the mouth, and advanced to the second part                            of duodenum. The upper GI endoscopy was                            accomplished without difficulty. The patient                            tolerated the procedure well. Scope In: Scope Out: Findings:      The examined esophagus was normal.      A small hiatal hernia was present.      The exam of the stomach was otherwise normal.      The examined duodenum was normal.      No sign of bleeding. Impression:               - Normal esophagus.                           - Small hiatal hernia.                           - Normal examined duodenum.                           - No specimens collected. Recommendation:           - Resume regular diet.                           - Continue present medications.                           - Hgb is 7.8 today. May benefit from another  blood                            transfusion given cardiac status. hospitalists                            please address.                           - Observe. Continue acid suppresion. Doubt                            colonoscopy is needed. Resume oral anti coagulation                            in a couple days if no further bleeding on heparin  drip. Procedure Code(s):        --- Professional ---                           [id number], Esophagogastroduodenoscopy, flexible,                            transoral; diagnostic, including collection of                            specimen(s) by brushing or washing, when performed                            (separate procedure) Diagnosis Code(s):        --- Professional ---                           K44.9, Diaphragmatic hernia without obstruction or                            gangrene                           R19.5, Other fecal abnormalities                           K92.1, Melena (includes Hematochezia) CPT copyright 2017 American Medical Association. All rights reserved. The codes documented in this report are preliminary and upon coder review may  be revised to meet current compliance requirements. Wonda Horner, MD 12/25/2017 12:00:32 PM This report has been signed electronically. Number of Addenda: 0

## 2017-12-25 NOTE — Transfer of Care (Signed)
Immediate Anesthesia Transfer of Care Note  Patient: Kristina Summers  Procedure(s) Performed: ESOPHAGOGASTRODUODENOSCOPY (EGD) WITH PROPOFOL (N/A )  Patient Location: Endoscopy Unit  Anesthesia Type:MAC  Level of Consciousness: awake, alert  and oriented  Airway & Oxygen Therapy: Patient Spontanous Breathing  Post-op Assessment: Report given to RN and Post -op Vital signs reviewed and stable  Post vital signs: Reviewed and stable  Last Vitals:  Vitals Value Taken Time  BP    Temp    Pulse 68 12/25/2017 11:43 AM  Resp    SpO2 100 % 12/25/2017 11:43 AM  Vitals shown include unvalidated device data.  Last Pain:  Vitals:   12/25/17 1104  TempSrc: Oral  PainSc: 0-No pain      Patients Stated Pain Goal: 0 (10/93/23 5573)  Complications: No apparent anesthesia complications

## 2017-12-25 NOTE — Anesthesia Preprocedure Evaluation (Signed)
Anesthesia Evaluation  Patient identified by MRN, date of birth, ID band Patient awake    Reviewed: Allergy & Precautions, NPO status , Patient's Chart, lab work & pertinent test results  Airway Mallampati: II  TM Distance: >3 FB     Dental   Pulmonary former smoker,    breath sounds clear to auscultation       Cardiovascular hypertension, + CAD, + Peripheral Vascular Disease and +CHF   Rhythm:Regular Rate:Normal     Neuro/Psych    GI/Hepatic GERD  ,  Endo/Other  diabetes  Renal/GU Renal disease     Musculoskeletal   Abdominal   Peds  Hematology  (+) anemia ,   Anesthesia Other Findings   Reproductive/Obstetrics                             Anesthesia Physical Anesthesia Plan  ASA: III  Anesthesia Plan: MAC   Post-op Pain Management:    Induction: Intravenous  PONV Risk Score and Plan: Treatment may vary due to age or medical condition  Airway Management Planned: Simple Face Mask and Nasal Cannula  Additional Equipment:   Intra-op Plan:   Post-operative Plan:   Informed Consent: I have reviewed the patients History and Physical, chart, labs and discussed the procedure including the risks, benefits and alternatives for the proposed anesthesia with the patient or authorized representative who has indicated his/her understanding and acceptance.   Dental advisory given  Plan Discussed with: Anesthesiologist and CRNA  Anesthesia Plan Comments:         Anesthesia Quick Evaluation

## 2017-12-25 NOTE — Progress Notes (Signed)
[place] for heparin Indication: atrial fibrillation  Allergies  Allergen Reactions  . Morphine And Related Nausea And Vomiting and Other (See Comments)    Chest pain, also   . Lisinopril Cough  . Zoloft [Sertraline Hcl] Nausea Only         Patient Measurements: Height: 5\' 6"  (167.6 cm) Weight: 193 lb 5.5 oz (87.7 kg) IBW/kg (Calculated) : 59.3 Heparin Dosing Weight: 78  Vital Signs: Temp: 97.7 F (36.5 C) (09/08 1144) Temp Source: Oral (09/08 1144) BP: 159/73 (09/08 1253) Pulse Rate: 67 (09/08 1253)  Labs: Recent Labs    12/22/17 1533 12/23/17 0220  12/23/17 1915 12/24/17 0402 12/24/17 1441 12/24/17 2248 12/25/17 0536  HGB 6.7* 7.4*   < > 8.4* 8.0*  --   --  7.8*  HCT 22.0* 23.5*   < > 27.1* 25.8*  --   --  25.8*  PLT 309 284  --   --  287  --   --  285  APTT  --  69*   < > 106* 110* 81* 99* 102*  LABPROT 20.4*  --   --   --   --   --   --   --   INR 1.76  --   --   --   --   --   --   --   HEPARINUNFRC  --  >2.20*  --   --   --   --  1.82*  --   CREATININE 1.32* 1.12*  --   --  1.08*  --   --   --    < > = values in this interval not displayed.    Estimated Creatinine Clearance: 46.4 mL/min (A) (by C-G formula based on SCr of 1.08 mg/dL (H)).    Assessment: 82 yo female with h/o afib on apixaban PTA. Admitted for anemia with possible GI bleed.  Given that patient is being transitioned from apixaban to heparin, we will need to draw aPTT and heparin levels due to apixaban's effect on heparin levels.   D/W MD will restart heparin post EGD.   Will restart at previous rate (no bolus due to bleeding).   Goal of Therapy:  aPTT 66-102 seconds  Heparin level 0.3-0.7 Monitor platelets by anticoagulation protocol: Yes   Plan:  Continue heparin at 800 units / hr HL and PTT in 8 hours Daily heparin level and aPTT until heparin level correlates with aPTT.  Monitor for s/sx of further bleeding.   Rahcel Shutes A. Levada Dy,  PharmD, [place] Pager: [phone] Please utilize Amion for appropriate phone number to reach the unit pharmacist ([place])   12/25/2017,12:57 PM

## 2017-12-25 NOTE — Progress Notes (Signed)
Pharmacy Antibiotic Note  Kristina Summers is a 82 y.o. female admitted on 12/22/2017 with continuation of IV vancomycin treatment for MRSA bacteremia.  Pharmacy has been consulted for vancomycin dosing.  Appropriately drawn vancomycin trough today was therapeutic at 15. WBC stable and within normal limits at 7.6, patient afebrile, and SCr improved to 1.08.   Plan:  Continue PTA  Vancomycin 1000 mg IV every 24 hours.  Goal trough 15-20 mcg/mL.  Original stop date: 10/10   Height: 5\' 6"  (167.6 cm) Weight: 193 lb 5.5 oz (87.7 kg) IBW/kg (Calculated) : 59.3  Temp (24hrs), Avg:97.9 F (36.6 C), Min:97.7 F (36.5 C), Max:98.2 F (36.8 C)  Recent Labs  Lab 12/22/17 1533 12/22/17 1710 12/23/17 0220 12/24/17 0402 12/25/17 0536  WBC 7.5  --  8.6 7.4 7.6  CREATININE 1.32*  --  1.12* 1.08*  --   VANCOTROUGH  --   --   --   --  15  VANCORANDOM  --  23  --   --   --     Estimated Creatinine Clearance: 46.4 mL/min (A) (by C-G formula based on SCr of 1.08 mg/dL (H)).    Allergies  Allergen Reactions  . Morphine And Related Nausea And Vomiting and Other (See Comments)    Chest pain, also   . Lisinopril Cough  . Zoloft [Sertraline Hcl] Nausea Only         Antimicrobials this admission: Vancomycin 9/5>> (originally planned stop date 10/10)  Dose adjustments this admission: None  Microbiology results: 9/6 MRSA PCR: neg  Thank you for allowing pharmacy to be a part of this patient's care.  Mills Koller 12/25/2017 8:11 AM

## 2017-12-25 NOTE — H&P (Signed)
Patient presents to the endoscopy department for EGD to evaluate melena. History reviewed.  No distress  Heart regular rhythm  Lungs clear  Abdomen soft and nontender  Impression melena  Plan EGD

## 2017-12-25 NOTE — Anesthesia Postprocedure Evaluation (Signed)
Anesthesia Post Note  Patient: Kristina Summers  Procedure(s) Performed: ESOPHAGOGASTRODUODENOSCOPY (EGD) WITH PROPOFOL (N/A )     Patient location during evaluation: Endoscopy Anesthesia Type: MAC Level of consciousness: awake Pain management: pain level controlled Vital Signs Assessment: post-procedure vital signs reviewed and stable Respiratory status: spontaneous breathing Cardiovascular status: stable Postop Assessment: no apparent nausea or vomiting Anesthetic complications: no    Last Vitals:  Vitals:   12/25/17 1356 12/25/17 1423  BP: (!) 144/57 (!) 126/50  Pulse: 60 (!) 53  Resp: 16 14  Temp: 36.6 C (!) 36.4 C  SpO2: 98% 97%    Last Pain:  Vitals:   12/25/17 1423  TempSrc: Axillary  PainSc:                  Jeyli Zwicker

## 2017-12-26 ENCOUNTER — Encounter (HOSPITAL_COMMUNITY): Payer: Self-pay | Admitting: Gastroenterology

## 2017-12-26 LAB — APTT: aPTT: 98 seconds — ABNORMAL HIGH (ref 24–36)

## 2017-12-26 LAB — TYPE AND SCREEN
ABO/RH(D): A POS
Antibody Screen: NEGATIVE
Unit division: 0
Unit division: 0
Unit division: 0

## 2017-12-26 LAB — BASIC METABOLIC PANEL
Anion gap: 4 — ABNORMAL LOW (ref 5–15)
BUN: 17 mg/dL (ref 8–23)
CALCIUM: 8.3 mg/dL — AB (ref 8.9–10.3)
CO2: 25 mmol/L (ref 22–32)
CREATININE: 1.3 mg/dL — AB (ref 0.44–1.00)
Chloride: 110 mmol/L (ref 98–111)
GFR, EST AFRICAN AMERICAN: 44 mL/min — AB (ref >60)
GFR, EST NON AFRICAN AMERICAN: 38 mL/min — AB (ref >60)
Glucose, Bld: 137 mg/dL — ABNORMAL HIGH (ref 70–99)
Potassium: 3.9 mmol/L (ref 3.5–5.1)
SODIUM: 139 mmol/L (ref 135–145)

## 2017-12-26 LAB — BPAM RBC
BLOOD PRODUCT EXPIRATION DATE: 201909232359
Blood Product Expiration Date: 201909172359
Blood Product Expiration Date: 201909232359
ISSUE DATE / TIME: 201909051939
ISSUE DATE / TIME: 201909061426
ISSUE DATE / TIME: 201909081348
UNIT TYPE AND RH: 6200
Unit Type and Rh: 6200
Unit Type and Rh: 6200

## 2017-12-26 LAB — FOLATE RBC
Folate, Hemolysate: 484.3 ng/mL
Folate, RBC: 2124 ng/mL (ref >498)
Hematocrit: 22.8 % — ABNORMAL LOW (ref 34.0–46.6)

## 2017-12-26 LAB — CBC WITH DIFFERENTIAL/PLATELET
Abs Immature Granulocytes: 0 10*3/uL (ref 0.0–0.1)
BASOS ABS: 0.1 10*3/uL (ref 0.0–0.1)
Basophils Relative: 1 %
EOS ABS: 0.3 10*3/uL (ref 0.0–0.7)
Eosinophils Relative: 3 %
HCT: 28.1 % — ABNORMAL LOW (ref 36.0–46.0)
Hemoglobin: 8.6 g/dL — ABNORMAL LOW (ref 12.0–15.0)
Immature Granulocytes: 1 %
Lymphocytes Relative: 16 %
Lymphs Abs: 1.2 10*3/uL (ref 0.7–4.0)
MCH: 28 pg (ref 26.0–34.0)
MCHC: 30.6 g/dL (ref 30.0–36.0)
MCV: 91.5 fL (ref 78.0–100.0)
Monocytes Absolute: 0.7 10*3/uL (ref 0.1–1.0)
Monocytes Relative: 9 %
NEUTROS PCT: 70 %
Neutro Abs: 5.6 10*3/uL (ref 1.7–7.7)
PLATELETS: 273 10*3/uL (ref 150–400)
RBC: 3.07 MIL/uL — AB (ref 3.87–5.11)
RDW: 16 % — AB (ref 11.5–15.5)
WBC: 7.9 10*3/uL (ref 4.0–10.5)

## 2017-12-26 LAB — GLUCOSE, CAPILLARY: Glucose-Capillary: 103 mg/dL — ABNORMAL HIGH (ref 70–99)

## 2017-12-26 LAB — HEPARIN LEVEL (UNFRACTIONATED): HEPARIN UNFRACTIONATED: 1.32 [IU]/mL — AB (ref 0.30–0.70)

## 2017-12-26 MED ORDER — APIXABAN 5 MG PO TABS: 5.0000 mg | ORAL_TABLET | Freq: Two times a day (BID) | ORAL | 0 refills | Status: DC

## 2017-12-26 MED ORDER — PANTOPRAZOLE SODIUM 40 MG PO TBEC: 40.0000 mg | DELAYED_RELEASE_TABLET | Freq: Two times a day (BID) | ORAL | 0 refills | Status: DC

## 2017-12-26 MED ORDER — HEPARIN SOD (PORK) LOCK FLUSH 100 UNIT/ML IV SOLN
250.0000 [IU] | INTRAVENOUS | Status: AC | PRN
  Administered 2017-12-26: 250 [IU]

## 2017-12-26 MED ORDER — VANCOMYCIN IV (FOR PTA / DISCHARGE USE ONLY): 1000.0000 mg | INTRAVENOUS | 0 refills | Status: DC

## 2017-12-26 NOTE — Progress Notes (Signed)
APTT and heparin level rescheduled per pharmacist recommendation  due to delay in drawing and the 5 am one D/C. Heparin drip still running at 8 mL/hour, no bleeding/bruising or any hematoma noted. Will continue to monitor.

## 2017-12-26 NOTE — Care Management Note (Addendum)
Case Management Note  Patient Details  Name: Kristina Summers MRN: [id number] Date of Birth: 1935-11-11  Subjective/Objective:           Admitted with Anemia,UTI. Hx of coronary artery disease/CABG in 2000, CVA,  aortic stenosis/TAVR on 11/08/2017, hypertension, hyperlipidemia, peripheral vascular disease, diastolic heart failure, paroxysmal atrial fibrillation on apixaban, stage III CKD, type 2 diabetes who was recently admitted on 12/11/2017 and discharged on 12/16/2017 with MRSA bacteremia on IV vancomycin for 6 weeks. Resides with son, Legrand Como.   [name] [place] (Daughter) Legrand Pitts (Daughter)     [phone]     PCP:  Dr. Verlin Fester  Action/Plan: Transition to home with continued IV ABX therapy, [facility]. [place] will continue to provide home health services ( RN,PT, NA). Pt states will take taxi home. Legrand Como (son) will receive her on arrival to home.  Expected Discharge Date:  12/26/17               Expected Discharge Plan:  [place]  In-House Referral:     Discharge planning Services  CM Consult  Post Acute Care Choice:  Resumption of Svcs/PTA Provider, Home Health, [place] RN; [facility] IVABX Therapy) Choice offered to:  Patient  DME Arranged:   IV ABX Therapy DME Agency:   [place], NCM made liaison aware of d/cplan for today.  HH Arranged:  RN, PT, Nurse's Aide [place] Agency:  [facility] [place], NCM made liaison aware of d/cplan for today.  Status of Service:  Completed, signed off  If discussed at [place] of Stay Meetings, dates discussed:    Additional Comments:  Sharin Mons, RN 12/26/2017, 11:30 AM

## 2017-12-26 NOTE — Discharge Summary (Addendum)
Physician Discharge Summary  Kristina Summers ZYY:[id number] DOB: 02/25/1936 DOA: 12/22/2017  PCP: Care, [place] date: 12/22/2017 Discharge date: 12/26/2017  Admitted From: Home Disposition:  Home  Discharge Condition:Stable CODE STATUS:FULL Diet recommendation: Heart HealthyDysphagia   Brief/Interim Summary: Patient is a 82 y.o.femalewith medical history significant ofcoronary artery disease status post CABG in 2000 with left heart catheterization on 10/11/2017 showing most of CABGs patient, history of CVA, gated by slowed speech, critical aortic stenosis status post TAVR on 11/08/2017, hypertension, hyperlipidemia, peripheral vascular disease, diastolic heart failure, paroxysmal atrial fibrillation on apixaban, stage III CKD, type 2 diabetes who was recently admitted on 12/11/2017 and discharged on 12/16/2017 with MRSA bacteremia on IV vancomycin for 6 weeks who presents because her weekly lab draws were noted to be abnormal with anemia. Patient herself denies noticing any GI bleeding or melena. She reported occasionally being slightly lightheaded but denies any chest pain, shortness of breath, nausea, vomiting, diarrhea. She reported a remote history of an endoscopy which reportedly was in the setting of GI bleeding with an elevated INR however no source was found. She also reported a colonoscopy 5 to 6 years ago by Dr. Cristina Gong. Of note on discharge her discharge hemoglobin was 8.5 and had been trending down during her prior admission. Patient has been admitted for further workup of her anemia.  She was evaluated  by GI and she underwent EGD which did not show any source of active bleeding.  Patient was also transfused with 2 units of PRBC during this hospitalization.  Currently her hemoglobin this morning is stable. Patient stable for discharge home today.  Following problems were addressed during her hospitalization:  Acute blood loss anemia: Hemoglobin this morning was 8.6.  She is  status post 3 units of PRBC in total.  Currently hemodynamically stable.  Continue PPI.  GI following and she underwent  EGD  which did not show any area of bleed. GI recommended to restart Eliquis in a couple of days.She can restart it tomorrow.  Check CBC during her follow-up with her PCP in a week  Status post TAVR/chronic atrial fibrillation: Was on Eliquis at home which is on hold.  Was on heparin drip here.  Will resume Eliquis on discharge. History of critical aortic stenosis.  Underwent TAVR procedure on 11/07/2017.  MRSA bacteremia: Recently admitted and discharged for MRSA bacteremia.  Plan is to continue 6 weeks of IV vancomycin due to MRSA bacteremia in the setting of TAVR.  Continue vancomycin 1000 mg daily till 01/26/2018.She has a PICC line.  Coronary disease: Status post CABG.  Continue aspirin, Lipitor  History of hypertension: Currently blood pressure stable.  Continue her home regimen.  On metoprolol and losartan and amlodipine.  Hyperlipidemia: Continue Lipitor  Chronic diastolic heart failure: On Lasix at home.    Type 2 diabetes mellitus: Continue home regimen.  History of CVA: On aspirin.  Stage III CKD: Currently stable.  Discharge Diagnoses:  Active Problems:   CAD (coronary artery disease)   Paroxysmal atrial fibrillation   Hyperlipidemia   S/P CABG (coronary artery bypass graft)   History of GI bleed   Peripheral vascular disease, unspecified (HCC)   Type II diabetes mellitus with renal manifestations (HCC)   Severe aortic stenosis   S/P TAVR (transcatheter aortic valve replacement)   CKD (chronic kidney disease), stage III (HCC)   UTI (urinary tract infection)   MRSA bacteremia   Acute blood loss anemia    Discharge Instructions  Discharge Instructions  Diet - low sodium heart healthy   Complete by:  As directed    Discharge instructions   Complete by:  As directed    1) Take prescribed medications as instructed. 2)Continue your  home medications.  Start Eliquis on the usual dose from tomorrow. 3)Folllow up with your PCP in a week.  Do a CBC test to check your hemoglobin during the follow-up.   Increase activity slowly   Complete by:  As directed      Allergies as of 12/26/2017      Reactions   Morphine And Related Nausea And Vomiting, Other (See Comments)   Chest pain, also   Lisinopril Cough   Zoloft [sertraline Hcl] Nausea Only         Medication List    TAKE these medications   acetaminophen 500 MG tablet Commonly known as:  TYLENOL Take 500 mg by mouth daily as needed for moderate pain or headache.   amLODipine 10 MG tablet Commonly known as:  NORVASC Take 1 tablet (10 mg total) by mouth daily.   apixaban 5 MG Tabs tablet Commonly known as:  ELIQUIS Take 1 tablet (5 mg total) by mouth 2 (two) times daily.   aspirin 81 MG chewable tablet Chew 1 tablet (81 mg total) by mouth daily.   atorvastatin 80 MG tablet Commonly known as:  LIPITOR Take 1 tablet (80 mg total) by mouth daily. What changed:  when to take this   furosemide 40 MG tablet Commonly known as:  LASIX Take 40 mg by mouth See admin instructions. Take one tablet (40 mg) by mouth twice daily - morning and afternoon   glucose 4 GM chewable tablet Chew 1-4 tablets by mouth once as needed for low blood sugar.   insulin NPH Human 100 UNIT/ML injection Commonly known as:  HUMULIN N,NOVOLIN N Inject 0.15 mLs (15 Units total) into the skin 2 (two) times daily. 15 units in the morning and 10 units in the PM   insulin regular 100 units/mL injection Commonly known as:  NOVOLIN R,HUMULIN R Inject 5 Units into the skin 3 (three) times daily after meals.   losartan 50 MG tablet Commonly known as:  COZAAR Take 1 tablet (50 mg total) by mouth daily.   metoprolol tartrate 25 MG tablet Commonly known as:  LOPRESSOR Take 0.5 tablets (12.5 mg total) by mouth 2 (two) times daily.   multivitamin with minerals Tabs tablet Take 1 tablet by  mouth daily. One a Day 50 plus   nitroGLYCERIN 0.4 MG SL tablet Commonly known as:  NITROSTAT Place 0.4 mg under the tongue every 5 (five) minutes as needed for chest pain.   pantoprazole 40 MG tablet Commonly known as:  PROTONIX Take 1 tablet (40 mg total) by mouth 2 (two) times daily.   saccharomyces boulardii 250 MG capsule Commonly known as:  FLORASTOR Take 1 capsule (250 mg total) by mouth 2 (two) times daily.   senna-docusate 8.6-50 MG tablet Commonly known as:  Senokot-S Take 1 tablet by mouth at bedtime as needed for mild constipation.   vancomycin  IVPB Inject 1,000 mg into the vein daily. Indication:  MRSA bacteremia Last Day of Therapy:  01/26/2018 Labs - Sunday/Monday:  CBC/D, BMP, and vancomycin trough. Labs - Thursday:  BMP and vancomycin trough Labs - Every other week:  ESR and CRP      Follow-up Information    [facility], [facility]. Schedule an appointment as soon as possible for a visit in 1 week(s).  Specialty:  Home Health Services Contact information: [place] [address] [phone]          Allergies  Allergen Reactions  . Morphine And Related Nausea And Vomiting and Other (See Comments)    Chest pain, also   . Lisinopril Cough  . Zoloft [Sertraline Hcl] Nausea Only         Consultations: GI  Procedures/Studies: Ct Head Wo Contrast  Result Date: 12/11/2017 CLINICAL DATA:  Patient fell. EXAM: CT HEAD WITHOUT CONTRAST TECHNIQUE: Contiguous axial images were obtained from the base of the skull through the vertex without intravenous contrast. COMPARISON:  July 08, 2017 FINDINGS: Brain: Evaluation mildly marred due to motion. Within this limitation, no subdural, epidural, or subarachnoid hemorrhage. No mass effect or midline shift. The left frontal parietal infarct is again identified. No acute cortical ischemia infarct noted. Cerebellum and brainstem are normal. Basal cisterns are normal. Vascular: Calcified atherosclerosis in  the intracranial carotids. Skull: Normal. Negative for fracture or focal lesion. Sinuses/Orbits: No acute finding. Other: None. IMPRESSION: 1. No acute intracranial abnormalities noted. 2. Chronic left frontal parietal infarct. Electronically Signed   By: Dorise Bullion III M.D   On: 12/11/2017 18:32   Dg Chest Port 1 View  Result Date: 12/11/2017 CLINICAL DATA:  Fever. EXAM: PORTABLE CHEST 1 VIEW COMPARISON:  November 08, 2016 FINDINGS: Cardiomegaly. The hila and mediastinum are normal. Haziness/increased interstitial markings in the right apex and diffusely on the left. No focal infiltrate. No other acute abnormalities. IMPRESSION: Increased interstitial markings diffusely on the left and confined to the right apex. The findings could represent asymmetric edema or an infectious process. The findings are unchanged since November 08, 2017. Electronically Signed   By: Dorise Bullion III M.D   On: 12/11/2017 16:04   Dg Chest Port 1v Same Day  Result Date: 12/15/2017 CLINICAL DATA:  PICC placement EXAM: PORTABLE CHEST 1 VIEW COMPARISON:  12/11/2017 FINDINGS: Right PICC line is in place with the tip at the cavoatrial junction. Prior median sternotomy, CABG and aortic valve repair. Mild interstitial prominence throughout the lungs could reflect early interstitial edema. No visible effusions or acute bony abnormality. IMPRESSION: Right PICC line tip at the cavoatrial junction. Cardiomegaly. Mild interstitial prominence, question early interstitial edema. Electronically Signed   By: Rolm Baptise M.D.   On: 12/15/2017 19:16   [place] Ekg Site Rite  Result Date: 12/15/2017 If Site Rite image not attached, placement could not be confirmed due to current cardiac rhythm.     Subjective: Patient seen and examined the bedside this morning.  Remains comfortable.  Hemodynamics stable.  No new episodes of bleeding.  Stable for discharge home today.  Discharge Exam: Vitals:   12/26/17 1045 12/26/17 1053  BP: (!) 150/44  (!) 142/54  Pulse: 63 64  Resp:    Temp:    SpO2:     Vitals:   12/25/17 2202 12/26/17 0454 12/26/17 1045 12/26/17 1053  BP: 140/69 (!) 134/59 (!) 150/44 (!) 142/54  Pulse: 76 62 63 64  Resp: 19 16    Temp: 98.8 F (37.1 C) 98.7 F (37.1 C)    TempSrc: Oral Oral    SpO2: 96% 94%    Weight:      Height:        General: Pt is alert, awake, not in acute distress Cardiovascular: RRR, S1/S2 +, no rubs, no gallops Respiratory: CTA bilaterally, no wheezing, no rhonchi Abdominal: Soft, NT, ND, bowel sounds + Extremities: no edema, no  cyanosis    The results of significant diagnostics from this hospitalization (including imaging, microbiology, ancillary and laboratory) are listed below for reference.     Microbiology: Recent Results (from the past 240 hour(s))  MRSA PCR Screening     Status: None   Collection Time: 12/23/17  2:02 PM  Result Value Ref Range Status   MRSA by PCR NEGATIVE NEGATIVE Final    Comment:        The GeneXpert MRSA Assay (FDA approved for NASAL specimens only), is one component of a comprehensive MRSA colonization surveillance program. It is not intended to diagnose MRSA infection nor to guide or monitor treatment for MRSA infections. Performed at [facility], [place] [address] [place], [place] [address]      Labs: BNP (last 3 results) Recent Labs    08/25/17 0323 11/04/17 0815 12/11/17 2132  BNP 432.3* 972.6* 7,510.2*   Basic Metabolic Panel: Recent Labs  Lab 12/22/17 1533 12/23/17 0220 12/24/17 0402 12/26/17 0326  NA 136 137 140 139  K 4.2 4.0 3.6 3.9  CL 101 105 108 110  CO2 '24 25 25 25  '$ GLUCOSE 276* 200* 73 137*  BUN 25* '21 14 17  '$ CREATININE 1.32* 1.12* 1.08* 1.30*  CALCIUM 9.5 9.0 8.6* 8.3*   Liver Function Tests: Recent Labs  Lab 12/22/17 1533 12/24/17 0402  AST 29 27  ALT 25 19  ALKPHOS 114 98  BILITOT 0.7 1.0  PROT 6.9 5.9*  ALBUMIN 2.3* 1.9*   No results for input(s): LIPASE, AMYLASE in the last 168  hours. No results for input(s): AMMONIA in the last 168 hours. CBC: Recent Labs  Lab 12/22/17 1533 12/23/17 0220 12/23/17 0915 12/23/17 1915 12/24/17 0402 12/25/17 0536 12/26/17 0326  WBC 7.5 8.6  --   --  7.4 7.6 7.9  NEUTROABS 5.5  --   --   --   --   --  5.6  HGB 6.7* 7.4* 8.1* 8.4* 8.0* 7.8* 8.6*  HCT 22.0* 23.5* 25.9* 27.1* 25.8* 25.8* 28.1*  MCV 91.7 88.7  --   --  89.9 91.8 91.5  PLT 309 284  --   --  287 285 273   Cardiac Enzymes: No results for input(s): CKTOTAL, CKMB, CKMBINDEX, TROPONINI in the last 168 hours. BNP: Invalid input(s): POCBNP CBG: Recent Labs  Lab 12/25/17 1111 12/25/17 1218 12/25/17 1611 12/25/17 2155 12/26/17 0801  GLUCAP 90 74 145* 237* 103*   D-Dimer No results for input(s): DDIMER in the last 72 hours. Hgb A1c No results for input(s): HGBA1C in the last 72 hours. Lipid Profile No results for input(s): CHOL, HDL, LDLCALC, TRIG, CHOLHDL, LDLDIRECT in the last 72 hours. Thyroid function studies No results for input(s): TSH, T4TOTAL, T3FREE, THYROIDAB in the last 72 hours.  Invalid input(s): FREET3 Anemia work up No results for input(s): VITAMINB12, FOLATE, FERRITIN, TIBC, IRON, RETICCTPCT in the last 72 hours. Urinalysis    Component Value Date/Time   COLORURINE YELLOW 12/23/2017 0600   APPEARANCEUR CLEAR 12/23/2017 0600   LABSPEC 1.010 12/23/2017 0600   PHURINE 6.0 12/23/2017 0600   GLUCOSEU 50 (A) 12/23/2017 0600   HGBUR NEGATIVE 12/23/2017 0600   BILIRUBINUR NEGATIVE 12/23/2017 0600   KETONESUR NEGATIVE 12/23/2017 0600   PROTEINUR 30 (A) 12/23/2017 0600   UROBILINOGEN 1.0 05/29/2014 1052   NITRITE NEGATIVE 12/23/2017 0600   LEUKOCYTESUR NEGATIVE 12/23/2017 0600   Sepsis Labs Invalid input(s): PROCALCITONIN,  WBC,  LACTICIDVEN Microbiology Recent Results (from the past 240 hour(s))  MRSA  PCR Screening     Status: None   Collection Time: 12/23/17  2:02 PM  Result Value Ref Range Status   MRSA by PCR NEGATIVE NEGATIVE  Final    Comment:        The GeneXpert MRSA Assay (FDA approved for NASAL specimens only), is one component of a comprehensive MRSA colonization surveillance program. It is not intended to diagnose MRSA infection nor to guide or monitor treatment for MRSA infections. Performed at [facility], [place] [address], [place], [place] [address]     Please note: You were cared for by a hospitalist during your hospital stay. Once you are discharged, your primary care physician will handle any further medical issues. Please note that NO REFILLS for any discharge medications will be authorized once you are discharged, as it is imperative that you return to your primary care physician (or establish a relationship with a primary care physician if you do not have one) for your post hospital discharge needs so that they can reassess your need for medications and monitor your lab values.    Time coordinating discharge: 40 minutes  SIGNED:   Shelly Coss, MD  [facility] 12/26/2017, 11:31 AM Pager [phone]  If 7PM-7AM, please contact night-coverage [email or web address] Password [email or web address]

## 2017-12-26 NOTE — Progress Notes (Signed)
[facility] [place]                                       Cardiology Office Note    Date:  12/28/2017   ID:  Kristina Summers, DOB Nov 15, 1935, MRN [id number]  PCP:  [facility], [facility]  Cardiologist: Dr. [name] / Dr. Burt Knack & Dr. Roxy Manns (TAVR)  CC: 1 month s/p TAVR   History of Present Illness:  Kristina Summers is a 82 y.o. female with a history of CAD s/p CABGx4V(2000), CVA with residual expressive aphasia, GI bleeding, PAD s/p several LE interventions, chronic diastolic CHF,anemia, CKD, DMT2, PAF on Eliquis and severe ASs/p TAVR (11/08/17) who presents to clinic for follow up.   She has a history of inferior MI in 1992 and ultimately was treated with multivessel CABG in 2000 by Dr Cyndia Bent. In 2016 the patient had a stroke and has residual expressive aphasia. She ambulates with a walker, but remains functionally independent. She has been anticoagulated with apixaban for secondary stroke prevention since then. She previously had GI bleeding on warfarin and this had been discontinued. The patient also has significant PAD and has undergone several revascularization procedures including right common iliac stenting and orbital atherectomy/PTA procedures on the left SFA. She ambulates with a walker and has fairly advanced vision loss.Despite all of her medical problems, she remains independent.   She has recently been admitted twice over the past year (since 01/2017) with acute CHF. 2D ECHO 08/26/17 showed EF 50-55% and severe AS with AVA 0.9 cm2 and mean gradient (S): 21 mm Hg; peak gradient (S): 34 mm Hg.Shewas seen by Dr. Judeth Porch the multidisciplinary valve clinicfor evaluation of possible TAVR. He was suspicious that she had severe AS with a highly calcified aortic valve with very limited leaflet mobility. Diagnostic L/RHC on 10/11/17.showed severe native vessel CAD withcontinued patency of the SVG to PDA, SVG to OM, SVG to diagonal, and LIMA to LADand  severe AS with amean transvalvular gradient of 43 mmHg and calculated aortic valve area of 0.8 cm.  She underwent successful TAVR with a 26 mm Edwards Sapien THV via the TF approach on 11/08/17. Post operative echoshowed EF 50-55%, normally functioning TAVR valve with mean gradient of 10 mm Hg and no PVL. She was started on ASA '81mg'$  daily and homeEliquisresumed.   She was admitted to [facility] 8/25-8/30/19 for MRSA bacteremia. TEE was negative for vegetations. She was discharged on IV abx. She was readmitted 9/5-12/26/17 for anemia with Hg 6.7. She was evaluatedby GI and she underwent EGD which did not show any source of active bleeding.  Patient was also transfused with 2 units of PRBC during this hospitalization. Echo during this admission showed normally functioning TAVR valve; mean gradient 11 mm Hg and no PVL.   Today she presents to clinic for follow up. She has a brace on her right foot due a diabetic ulcer and walks with a walker, but when walking around to the gazebo she does not have any shortness of breath. She does not attempt to do any other more strenuous exertion. She has some mild LE edema that is improved from previous. No chest pain. No PND or orthopnea. She has had a few spells of dizziness when standing up from sitting but no syncope. No blood in her stool or urine that she can tell. She has some scant blood from  right nasal passage   Past Medical History:  Diagnosis Date  . Anemia   . CAD (coronary artery disease)    a. s/p CABG in 2000  . Carotid artery occlusion   . CKD (chronic kidney disease)   . Diastolic CHF ([place]) 32/4401  . Fibromyalgia   . GERD (gastroesophageal reflux disease)   . History of CVA (cerebrovascular accident) 05/24/2014  . History of GI bleed   . Hyperlipidemia   . Hypertension   . Obesity   . PAF (paroxysmal atrial fibrillation) (HCC)    a. on Eliquis  . Peripheral vascular disease ([place])   . S/P TAVR (transcatheter aortic valve replacement)  11/08/2017   26 mm Edwards Sapien 3 transcatheter heart valve placed via percutaneous left transfemoral approach   . Type II diabetes mellitus ([place]) dx'd 1979    Past Surgical History:  Procedure Laterality Date  . ABDOMINAL AORTAGRAM N/A 04/01/2011   Procedure: ABDOMINAL AORTAGRAM;  Surgeon: Conrad Ghent, MD;  Location[facility] CATH LAB;  Service: Cardiovascular;  Laterality: N/A;  . ANGIOPLASTY  06/17/11   Left leg common femoral artery cannulation under u/s Left leg runoff  . CARDIAC CATHETERIZATION  10/11/2017  . CARPAL TUNNEL RELEASE Right   . CATARACT EXTRACTION W/ INTRAOCULAR LENS  IMPLANT, BILATERAL  2004-2005  . CORONARY ARTERY BYPASS GRAFT  2000   CABG X5  . ESOPHAGOGASTRODUODENOSCOPY (EGD) WITH PROPOFOL N/A 12/25/2017   Procedure: ESOPHAGOGASTRODUODENOSCOPY (EGD) WITH PROPOFOL;  Surgeon: Wonda Horner, MD;  Location: [facility] ENDOSCOPY;  Service: Endoscopy;  Laterality: N/A;  . EYE SURGERY Left    "lasered before cataract OR"  . LOWER EXTREMITY ANGIOGRAM Bilateral 04/01/2011   Procedure: LOWER EXTREMITY ANGIOGRAM;  Surgeon: Conrad Grenora, MD;  Location: [facility] CATH LAB;  Service: Cardiovascular;  Laterality: Bilateral;  . LOWER EXTREMITY ANGIOGRAM Left 06/17/2011   Procedure: LOWER EXTREMITY ANGIOGRAM;  Surgeon: Conrad Hauula, MD;  Location[facility] LAB;  Service: Cardiovascular;  Laterality: Left;  . LOWER EXTREMITY ANGIOGRAM N/A 11/18/2011   Procedure: LOWER EXTREMITY ANGIOGRAM;  Surgeon: Conrad Hialeah, MD;  Location: [facility] CATH LAB;  Service: Cardiovascular;  Laterality: N/A;  . PERCUTANEOUS STENT INTERVENTION Right 04/01/2011   Procedure: PERCUTANEOUS STENT INTERVENTION;  Surgeon: Conrad Evansdale, MD;  Location: [facility] CATH LAB;  Service: Cardiovascular;  Laterality: Right;  rt iliac stent  . PERIPHERAL ARTERIAL STENT GRAFT  04/01/11   right common iliac  . RIGHT/LEFT HEART CATH AND CORONARY/GRAFT ANGIOGRAPHY N/A 10/11/2017   Procedure: RIGHT/LEFT HEART CATH AND CORONARY/GRAFT ANGIOGRAPHY;  Surgeon:  Sherren Mocha, MD;  Location: [place] CV LAB;  Service: Cardiovascular;  Laterality: N/A;  . TEE WITHOUT CARDIOVERSION N/A 11/08/2017   Procedure: TRANSESOPHAGEAL ECHOCARDIOGRAM (TEE);  Surgeon: Sherren Mocha, MD;  Location: [place];  Service: Open Heart Surgery;  Laterality: N/A;  . TEE WITHOUT CARDIOVERSION N/A 12/15/2017   Procedure: TRANSESOPHAGEAL ECHOCARDIOGRAM (TEE);  Surgeon: Larey Dresser, MD;  Location: [facility] ENDOSCOPY;  Service: Cardiovascular;  Laterality: N/A;  . TRANSCATHETER AORTIC VALVE REPLACEMENT, TRANSFEMORAL N/A 11/08/2017   Procedure: TRANSCATHETER AORTIC VALVE REPLACEMENT, TRANSFEMORAL;  Surgeon: Sherren Mocha, MD;  Location: [place];  Service: Open Heart Surgery;  Laterality: N/A;  . TRIGGER FINGER RELEASE Left 1996   thumb    Current Medications: Outpatient Medications Prior to Visit  Medication Sig Dispense Refill  . acetaminophen (TYLENOL) 500 MG tablet Take 500 mg by mouth daily as needed for moderate pain or headache.    Marland Kitchen amLODipine (NORVASC) 5 MG tablet Take  5 mg by mouth daily.    Marland Kitchen apixaban (ELIQUIS) 5 MG TABS tablet Take 1 tablet (5 mg total) by mouth 2 (two) times daily. 60 tablet 0  . aspirin 81 MG chewable tablet Chew 1 tablet (81 mg total) by mouth daily.    Marland Kitchen atorvastatin (LIPITOR) 80 MG tablet Take 1 tablet (80 mg total) by mouth daily. 30 tablet 0  . furosemide (LASIX) 40 MG tablet Take 40 mg by mouth See admin instructions. Take one tablet (40 mg) by mouth twice daily - morning and afternoon  0  . glucose 4 GM chewable tablet Chew 1-4 tablets by mouth once as needed for low blood sugar.     . insulin NPH Human (HUMULIN N,NOVOLIN N) 100 UNIT/ML injection Inject 0.15 mLs (15 Units total) into the skin 2 (two) times daily. 15 units in the morning and 10 units in the PM 10 mL 11  . insulin regular (NOVOLIN R,HUMULIN R) 100 units/mL injection Inject 5 Units into the skin 3 (three) times daily after meals.     Marland Kitchen losartan (COZAAR) 50 MG tablet Take 1 tablet  (50 mg total) by mouth daily. 90 tablet 3  . metoprolol tartrate (LOPRESSOR) 25 MG tablet Take 0.5 tablets (12.5 mg total) by mouth 2 (two) times daily. 30 tablet 6  . Multiple Vitamin (MULTIVITAMIN WITH MINERALS) TABS tablet Take 1 tablet by mouth daily. One a Day 50 plus    . nitroGLYCERIN (NITROSTAT) 0.4 MG SL tablet Place 0.4 mg under the tongue every 5 (five) minutes as needed for chest pain.    . pantoprazole (PROTONIX) 40 MG tablet Take 1 tablet (40 mg total) by mouth 2 (two) times daily. 60 tablet 0  . saccharomyces boulardii (FLORASTOR) 250 MG capsule Take 1 capsule (250 mg total) by mouth 2 (two) times daily. 30 capsule 0  . senna-docusate (SENOKOT-S) 8.6-50 MG tablet Take 1 tablet by mouth at bedtime as needed for mild constipation. 10 tablet 0  . vancomycin IVPB Inject 1,000 mg into the vein daily. Indication:  MRSA bacteremia Last Day of Therapy:  01/26/2018 Labs - Sunday/Monday:  CBC/D, BMP, and vancomycin trough. Labs - Thursday:  BMP and vancomycin trough Labs - Every other week:  ESR and CRP 31 Units 0  . amLODipine (NORVASC) 10 MG tablet Take 1 tablet (10 mg total) by mouth daily. (Patient not taking: Reported on 12/28/2017) 30 tablet 5   No facility-administered medications prior to visit.      Allergies:   Morphine and related; Lisinopril; and Zoloft [sertraline hcl]   Social History   Socioeconomic History  . Marital status: Divorced    Spouse name: Not on file  . Number of children: 3  . Years of education: 55  . Highest education level: Not on file  Occupational History  . Occupation: Retired  Scientific laboratory technician  . Financial resource strain: Not on file  . Food insecurity:    Worry: Not on file    Inability: Not on file  . Transportation needs:    Medical: Not on file    Non-medical: Not on file  Tobacco Use  . Smoking status: Former Smoker    Packs/day: 2.00    Years: 30.00    Pack years: 60.00    Types: Cigarettes    Last attempt to quit: 04/19/1990     Years since quitting: 27.7  . Smokeless tobacco: Never Used  . Tobacco comment: stopped smoking cigarettes 1991  Substance and Sexual Activity  .  Alcohol use: Not Currently    Alcohol/week: 0.0 standard drinks    Comment: "tried different alcohols when I was 1st married; never drank much at  ALL"  . Drug use: Never  . Sexual activity: Not Currently  Lifestyle  . Physical activity:    Days per week: Not on file    Minutes per session: Not on file  . Stress: Not on file  Relationships  . Social connections:    Talks on phone: Not on file    Gets together: Not on file    Attends religious service: Not on file    Active member of club or organization: Not on file    Attends meetings of clubs or organizations: Not on file    Relationship status: Not on file  Other Topics Concern  . Not on file  Social History Narrative   Divorced.  Native of [place].  Formerly worked as Education administrator person. 01/13/17 lives alone   Caffeine use: drinks decaf tea and coffee     Family History:  The patient's family history includes Diabetes in her brother; Other in her brother.      ROS:   Please see the history of present illness.    ROS All other systems reviewed and are negative.   PHYSICAL EXAM:   VS:  BP (!) 128/42   Pulse 72   Ht '5\' 6"'$  (1.676 m)   Wt 190 lb (86.2 kg)   SpO2 96%   BMI 30.67 kg/m    GEN: Well nourished, well developed, in no acute distress  HEENT: normal  Neck: no JVD, carotid bruits, or masses Cardiac: irreg irreg; 2/6 SEM @ RUSB. No rubs, or gallops. 1+ bilateral LE edema  Respiratory:  clear to auscultation bilaterally, normal work of breathing GI: soft, nontender, nondistended, + BS MS: no deformity or atrophy  Skin: warm and dry, no rash Neuro:  Alert and Oriented x 3, Strength and sensation are intact Psych: euthymic mood, full affect    Wt Readings from Last 3 Encounters:  12/28/17 190 lb (86.2 kg)  12/25/17 193 lb 5.5 oz (87.7 kg)  12/11/17 185 lb (83.9  kg)      Studies/Labs Reviewed:   EKG:  EKG is NOT ordered today.   Recent Labs: 12/11/2017: B Natriuretic Peptide 1,270.0 12/14/2017: Magnesium 2.2 12/24/2017: ALT 19 12/26/2017: BUN 17; Creatinine, Ser 1.30; Hemoglobin 8.6; Platelets 273; Potassium 3.9; Sodium 139   Lipid Panel    Component Value Date/Time   CHOL 92 September 03, 202016 1006   TRIG 165 (H) September 03, 202016 1006   HDL 11 (L) September 03, 202016 1006   CHOLHDL 8.4 September 03, 202016 1006   VLDL 33 September 03, 202016 1006   LDLCALC 48 September 03, 202016 1006    Additional studies/ records that were reviewed today include:  TAVR OPERATIVE NOTE   Date of Procedure:11/08/2017  Preoperative Diagnosis:Severe Aortic Stenosis   Procedure:   Transcatheter Aortic Valve Replacement - PercutaneousLeftTransfemoral Approach Edwards Sapien 3 THV (size 82m, model # 9600TFX, serial # [id number]  Co-Surgeons:Michael CBurt Knack MD andClarence H. ORoxy Manns MD   Pre-operative Echo Findings: ? Severe aortic stenosis ? Normalleft ventricular systolic function  Post-operative Echo Findings: ? Trivialparavalvular leak ? Normalleft ventricular systolic function  ______________  Post operative echo 11/09/17 Study Conclusion - Left ventricle: The cavity size was mildly dilated. Wall thickness was normal. Systolic function was normal. The estimated ejection fraction was in the range of 50% to 55%. The study is not technically sufficient to allow evaluation of LV diastolic function. - Aortic valve:  Normal appearing 26 mm Sapien 3 valve with no significant peri valvular regurgitation. Valve area (VTI): 2.3 cm^2. Valve area (Vmean): 2.74 cm^2. - Mitral valve: Calcified annulus. There was mild regurgitation. Valve area by continuity equation (using LVOT flow): 3.4 cm^2. - Left atrium: The atrium was moderately to severely dilated. - Atrial septum: No defect or patent foramen ovale was  identified.  ______________  2D ECHO 12/12/17 Study Conclusions - Left ventricle: The cavity size was normal. Wall thickness was   increased in a pattern of mild LVH. Systolic function was normal.   The estimated ejection fraction was in the range of 55% to 60%.   Incoordinate septal motion. The study is not technically   sufficient to allow evaluation of LV diastolic function. - Aortic valve: s/p 26 mm Edwards Sapien 3 TAVR valve. No   obstruction or paravalvular leak. Mean gradient (S): 11 mm Hg.   Peak gradient (S): 21 mm Hg. Valve area (VTI): 1.76 cm^2. Valve   area (Vmax): 2.01 cm^2. Valve area (Vmean): 1.73 cm^2. - Mitral valve: Calcified annulus. Mildly thickened leaflets .   There was mild regurgitation. - Left atrium: Severely dilated. - Right ventricle: The cavity size was normal. Moderate systolic   dysfunction. - Right atrium: The atrium was mildly dilated. - Inferior vena cava: The vessel was dilated. The respirophasic   diameter changes were blunted (< 50%), consistent with elevated   central venous pressure. Impressions: - Compared to a prior study in 10/2017, the LVEF is higher at   55-60%. Stable TAVR valve without paravalular leak. No obvious   valvular vegetations are seen, but cannot be excluded on the   basis of this study. Consider TEE if clinical scenario is   concerning for endocarditis.    ASSESSMENT & PLAN:   Severe AS s/p TAVR: 2D ECHO shows EF 50-60% with mean gradient 11 mm Hg and no PVL. She has NYHA class I symptoms, but is not very physically active. She can stop ASA 04/2018 and continue on Eliquis. SBE prophylaxis discussed. She does not see a dentist given full dentures. I will see her back in 1 year for follow up with an echo.  Chronic diastolic CHF: appears euvolemic. Continue current medications.   Chronic atrial fibrillation: rate well controlled. Continue on Eliquis  CKD stage III: creat has been stable ~1-1.3.  HTN: BP well  controlled today.   CAD: pre TAVR cath showed patent bypass grafts. Continue medical therapy with ASA, BB and statin  Anemia with recent bleed: GI evaluated and could not find a source of her bleeding. Home health is checking her blood counts.   MRSA bacteremia: she is currently on IV abx and has follow up with ID arranged. Thankfully, TEE showed no vegetations on her TAVR valve.   Medication Adjustments/Labs and Tests Ordered: Current medicines are reviewed at length with the patient today.  Concerns regarding medicines are outlined above.  Medication changes, Labs and Tests ordered today are listed in the Patient Instructions below. Patient Instructions  Medication Instructions:   STOP TAKING  ASPIRIN 05-11-2018  If you need a refill on your cardiac medications before your next appointment, please call your pharmacy.  Labwork: NONE ORDERED  TODAY    Testing/Procedures: IN A YEAR Your physician has requested that you have an echocardiogram. Echocardiography is a painless test that uses sound waves to create images of your heart. It provides your doctor with information about the size and shape of your heart and how well your  heart's chambers and valves are working. This procedure takes approximately one hour. There are no restrictions for this procedure.     Follow-Up: Your physician wants you to follow-up in: [place] will receive a reminder letter in the mail two months in advance. If you don't receive a letter, please call our office to schedule the follow-up appointment.   YOU WILL BE CONTACTED BACK FROM [facility] BROWN OR [facility] FOR FURTHER INSTRUCTIONS AND APPOINTMENT   Any Other Special Instructions Will Be Listed Below (If Applicable).                                                                                                                                                      Signed, Angelena Form, PA-C  12/28/2017 4:01 PM    [facility] [place], [place], [place]  [address] Phone: [phone]; Fax: [phone]

## 2017-12-26 NOTE — Progress Notes (Signed)
ANTICOAGULATION CONSULT NOTE - Follow Up Consult  Pharmacy Consult for heparin Indication: atrial fibrillation  Labs: Recent Labs    12/24/17 0402  12/24/17 2248 12/25/17 0536 12/26/17 0326  HGB 8.0*  --   --  7.8* 8.6*  HCT 25.8*  --   --  25.8* 28.1*  PLT 287  --   --  285 273  APTT 110*   < > 99* 102* 98*  HEPARINUNFRC  --   --  1.82*  --  1.32*  CREATININE 1.08*  --   --   --   --    < > = values in this interval not displayed.    Assessment/Plan:  82yo female therapeutic on heparin after resumed. Will continue gtt at current rate and confirm stable with additional PTT.   Wynona Neat, PharmD, BCPS  12/26/2017,4:30 AM

## 2017-12-26 NOTE — Progress Notes (Signed)
Kristina Summers discharged Home per MD order.  Discharge instructions reviewed and discussed with the patient, all questions and concerns answered. Copy of instructions, care notes for new medication and diagnosis and script given to patient.  Allergies as of 12/26/2017      Reactions   Morphine And Related Nausea And Vomiting, Other (See Comments)   Chest pain, also   Lisinopril Cough   Zoloft [sertraline Hcl] Nausea Only         Medication List    TAKE these medications   acetaminophen 500 MG tablet Commonly known as:  TYLENOL Take 500 mg by mouth daily as needed for moderate pain or headache.   amLODipine 10 MG tablet Commonly known as:  NORVASC Take 1 tablet (10 mg total) by mouth daily.   apixaban 5 MG Tabs tablet Commonly known as:  ELIQUIS Take 1 tablet (5 mg total) by mouth 2 (two) times daily.   aspirin 81 MG chewable tablet Chew 1 tablet (81 mg total) by mouth daily.   atorvastatin 80 MG tablet Commonly known as:  LIPITOR Take 1 tablet (80 mg total) by mouth daily. What changed:  when to take this   furosemide 40 MG tablet Commonly known as:  LASIX Take 40 mg by mouth See admin instructions. Take one tablet (40 mg) by mouth twice daily - morning and afternoon   glucose 4 GM chewable tablet Chew 1-4 tablets by mouth once as needed for low blood sugar.   insulin NPH Human 100 UNIT/ML injection Commonly known as:  HUMULIN N,NOVOLIN N Inject 0.15 mLs (15 Units total) into the skin 2 (two) times daily. 15 units in the morning and 10 units in the PM   insulin regular 100 units/mL injection Commonly known as:  NOVOLIN R,HUMULIN R Inject 5 Units into the skin 3 (three) times daily after meals.   losartan 50 MG tablet Commonly known as:  COZAAR Take 1 tablet (50 mg total) by mouth daily.   metoprolol tartrate 25 MG tablet Commonly known as:  LOPRESSOR Take 0.5 tablets (12.5 mg total) by mouth 2 (two) times daily.   multivitamin with minerals Tabs tablet Take 1  tablet by mouth daily. One a Day 50 plus   nitroGLYCERIN 0.4 MG SL tablet Commonly known as:  NITROSTAT Place 0.4 mg under the tongue every 5 (five) minutes as needed for chest pain.   pantoprazole 40 MG tablet Commonly known as:  PROTONIX Take 1 tablet (40 mg total) by mouth 2 (two) times daily.   saccharomyces boulardii 250 MG capsule Commonly known as:  FLORASTOR Take 1 capsule (250 mg total) by mouth 2 (two) times daily.   senna-docusate 8.6-50 MG tablet Commonly known as:  Senokot-S Take 1 tablet by mouth at bedtime as needed for mild constipation.   vancomycin  IVPB Inject 1,000 mg into the vein daily. Indication:  MRSA bacteremia Last Day of Therapy:  01/26/2018 Labs - Sunday/Monday:  CBC/D, BMP, and vancomycin trough. Labs - Thursday:  BMP and vancomycin trough Labs - Every other week:  ESR and CRP       IV site discontinued and catheter remains intact. Site without signs and symptoms of complications. Dressing and pressure applied.  Patient escorted to car by volunteer services in a wheelchair,  no distress noted upon discharge.  Kristina Summers, Kristina Summers 12/26/2017 12:51 PM

## 2017-12-28 ENCOUNTER — Ambulatory Visit (INDEPENDENT_AMBULATORY_CARE_PROVIDER_SITE_OTHER): Payer: Medicare Other | Admitting: Physician Assistant

## 2017-12-28 ENCOUNTER — Encounter: Payer: Self-pay | Admitting: Physician Assistant

## 2017-12-28 VITALS — BP 128/42 | HR 72 | Ht 66.0 in | Wt 190.0 lb

## 2017-12-28 DIAGNOSIS — Z952 Presence of prosthetic heart valve: Secondary | ICD-10-CM | POA: Diagnosis not present

## 2017-12-28 DIAGNOSIS — I482 Chronic atrial fibrillation, unspecified: Secondary | ICD-10-CM

## 2017-12-28 DIAGNOSIS — I5032 Chronic diastolic (congestive) heart failure: Secondary | ICD-10-CM

## 2017-12-28 DIAGNOSIS — N189 Chronic kidney disease, unspecified: Secondary | ICD-10-CM | POA: Diagnosis not present

## 2017-12-28 DIAGNOSIS — I10 Essential (primary) hypertension: Secondary | ICD-10-CM

## 2017-12-28 DIAGNOSIS — I2510 Atherosclerotic heart disease of native coronary artery without angina pectoris: Secondary | ICD-10-CM | POA: Diagnosis not present

## 2017-12-28 DIAGNOSIS — I70213 Atherosclerosis of native arteries of extremities with intermittent claudication, bilateral legs: Secondary | ICD-10-CM

## 2017-12-28 DIAGNOSIS — D649 Anemia, unspecified: Secondary | ICD-10-CM

## 2017-12-28 NOTE — Patient Instructions (Signed)
Medication Instructions:   STOP TAKING  ASPIRIN 05-11-2018  If you need a refill on your cardiac medications before your next appointment, please call your pharmacy.  Labwork: NONE ORDERED  TODAY    Testing/Procedures: IN A YEAR Your physician has requested that you have an echocardiogram. Echocardiography is a painless test that uses sound waves to create images of your heart. It provides your doctor with information about the size and shape of your heart and how well your heart's chambers and valves are working. This procedure takes approximately one hour. There are no restrictions for this procedure.     Follow-Up: Your physician wants you to follow-up in: [place] will receive a reminder letter in the mail two months in advance. If you don't receive a letter, please call our office to schedule the follow-up appointment.   YOU WILL BE CONTACTED BACK FROM [facility] OR [facility] FOR FURTHER INSTRUCTIONS AND APPOINTMENT   Any Other Special Instructions Will Be Listed Below (If Applicable).

## 2018-01-02 ENCOUNTER — Encounter: Payer: Self-pay | Admitting: Internal Medicine

## 2018-01-09 ENCOUNTER — Encounter: Payer: Self-pay | Admitting: Thoracic Surgery (Cardiothoracic Vascular Surgery)

## 2018-01-16 ENCOUNTER — Inpatient Hospital Stay: Payer: Medicare Other | Admitting: Family

## 2018-01-16 ENCOUNTER — Encounter (HOSPITAL_COMMUNITY): Payer: Self-pay | Admitting: *Deleted

## 2018-01-16 ENCOUNTER — Other Ambulatory Visit: Payer: Self-pay

## 2018-01-16 ENCOUNTER — Emergency Department (HOSPITAL_COMMUNITY)
Admission: EM | Admit: 2018-01-16 | Discharge: 2018-01-16 | Disposition: A | Discharge status: Home / Self Care | Payer: Medicare Other | Attending: Emergency Medicine | Admitting: Emergency Medicine

## 2018-01-16 ENCOUNTER — Emergency Department (HOSPITAL_COMMUNITY): Payer: Medicare Other

## 2018-01-16 DIAGNOSIS — Z87891 Personal history of nicotine dependence: Secondary | ICD-10-CM | POA: Diagnosis not present

## 2018-01-16 DIAGNOSIS — Z7982 Long term (current) use of aspirin: Secondary | ICD-10-CM | POA: Diagnosis not present

## 2018-01-16 DIAGNOSIS — E119 Type 2 diabetes mellitus without complications: Secondary | ICD-10-CM | POA: Insufficient documentation

## 2018-01-16 DIAGNOSIS — R0600 Dyspnea, unspecified: Secondary | ICD-10-CM

## 2018-01-16 DIAGNOSIS — N183 Chronic kidney disease, stage 3 (moderate): Secondary | ICD-10-CM | POA: Diagnosis not present

## 2018-01-16 DIAGNOSIS — Z794 Long term (current) use of insulin: Secondary | ICD-10-CM | POA: Insufficient documentation

## 2018-01-16 DIAGNOSIS — I5030 Unspecified diastolic (congestive) heart failure: Secondary | ICD-10-CM | POA: Insufficient documentation

## 2018-01-16 DIAGNOSIS — Z79899 Other long term (current) drug therapy: Secondary | ICD-10-CM | POA: Diagnosis not present

## 2018-01-16 DIAGNOSIS — D649 Anemia, unspecified: Secondary | ICD-10-CM | POA: Diagnosis not present

## 2018-01-16 DIAGNOSIS — I130 Hypertensive heart and chronic kidney disease with heart failure and stage 1 through stage 4 chronic kidney disease, or unspecified chronic kidney disease: Secondary | ICD-10-CM | POA: Diagnosis not present

## 2018-01-16 DIAGNOSIS — R0602 Shortness of breath: Secondary | ICD-10-CM | POA: Diagnosis present

## 2018-01-16 LAB — URINALYSIS, ROUTINE W REFLEX MICROSCOPIC
Bacteria, UA: NONE SEEN
Bilirubin Urine: NEGATIVE
Glucose, UA: NEGATIVE mg/dL
KETONES UR: NEGATIVE mg/dL
LEUKOCYTES UA: NEGATIVE
Nitrite: NEGATIVE
PROTEIN: 100 mg/dL — AB
Specific Gravity, Urine: 1.009 (ref 1.005–1.030)
pH: 6 (ref 5.0–8.0)

## 2018-01-16 LAB — COMPREHENSIVE METABOLIC PANEL
ALBUMIN: 2.6 g/dL — AB (ref 3.5–5.0)
ALT: 17 U/L (ref 0–44)
ANION GAP: 8 (ref 5–15)
AST: 32 U/L (ref 15–41)
Alkaline Phosphatase: 117 U/L (ref 38–126)
BUN: 26 mg/dL — ABNORMAL HIGH (ref 8–23)
CALCIUM: 8.8 mg/dL — AB (ref 8.9–10.3)
CO2: 23 mmol/L (ref 22–32)
Chloride: 104 mmol/L (ref 98–111)
Creatinine, Ser: 1.25 mg/dL — ABNORMAL HIGH (ref 0.44–1.00)
GFR calc non Af Amer: 40 mL/min — ABNORMAL LOW (ref >60)
GFR, EST AFRICAN AMERICAN: 46 mL/min — AB (ref >60)
GLUCOSE: 174 mg/dL — AB (ref 70–99)
Potassium: 3.8 mmol/L (ref 3.5–5.1)
SODIUM: 135 mmol/L (ref 135–145)
Total Bilirubin: 0.8 mg/dL (ref 0.3–1.2)
Total Protein: 7.5 g/dL (ref 6.5–8.1)

## 2018-01-16 LAB — CBC
HCT: 27.8 % — ABNORMAL LOW (ref 36.0–46.0)
HEMOGLOBIN: 8.4 g/dL — AB (ref 12.0–15.0)
MCH: 29.5 pg (ref 26.0–34.0)
MCHC: 30.2 g/dL (ref 30.0–36.0)
MCV: 97.5 fL (ref 78.0–100.0)
Platelets: 226 10*3/uL (ref 150–400)
RBC: 2.85 MIL/uL — ABNORMAL LOW (ref 3.87–5.11)
RDW: 17.2 % — ABNORMAL HIGH (ref 11.5–15.5)
WBC: 8.1 10*3/uL (ref 4.0–10.5)

## 2018-01-16 LAB — I-STAT TROPONIN, ED
TROPONIN I, POC: 0 ng/mL (ref 0.00–0.08)
Troponin i, poc: 0 ng/mL (ref 0.00–0.08)

## 2018-01-16 LAB — POC OCCULT BLOOD, ED: Fecal Occult Bld: POSITIVE — AB

## 2018-01-16 LAB — BRAIN NATRIURETIC PEPTIDE: B Natriuretic Peptide: 724.9 pg/mL — ABNORMAL HIGH (ref 0.0–100.0)

## 2018-01-16 MED ORDER — FUROSEMIDE 10 MG/ML IJ SOLN
40.0000 mg | Freq: Once | INTRAMUSCULAR | Status: AC
  Administered 2018-01-16: 40 mg via INTRAVENOUS
  Filled 2018-01-16: qty 4

## 2018-01-16 NOTE — Progress Notes (Signed)
RT NOTE: patient arrived with EMS on cpap. Once EMS removed cpap RT placed patient on 3L [place]. Patient states that she is feeling much better and said her breathing is comfortable on the [place]. Breath sounds are clear and diminished and patient is sating 99%. RT will continue to monitor.

## 2018-01-16 NOTE — ED Notes (Signed)
Pt denies any pain, is smiling, laughing, states she feels better, but is hungry. [place] given

## 2018-01-16 NOTE — Discharge Instructions (Addendum)
It was our pleasure to provide your ER care today - we hope that you feel better.  Continue your medication. Take on extra dose of your lasix tomorrow. Limit salt intake.   Follow up with your doctor/cardiologist in the next 1-2 days for recheck  - call office to arrange follow up appointment.  Return to ER if worse, new symptoms, increased trouble breathing, weak/fainting, chest pain, fevers, other concern.

## 2018-01-16 NOTE — ED Notes (Signed)
Daughter -- [phone] Nunzio Cory.

## 2018-01-16 NOTE — ED Provider Notes (Signed)
[facility] EMERGENCY DEPARTMENT Provider Note   CSN: [id number] Arrival date & time: 01/16/18  3383     History   Chief Complaint Chief Complaint  Patient presents with  . Shortness of Breath    HPI Kristina Summers is a 82 y.o. female.  Pt c/o sob onset in past day, acutely worse this AM. Symptoms moderate-severe, persistent, worsening. States compliant w home meds. +increased bilateral leg edema. +orthopnea. Denies chest pain or discomfort. Denies increased cough. No fever or chills. No vomiting or choking. Denies blood loss or melena.   The history is provided by the patient.  Shortness of Breath  Associated symptoms include leg swelling. Pertinent negatives include no fever, no headaches, no sore throat, no neck pain, no cough, no chest pain, no vomiting, no abdominal pain and no rash.    Past Medical History:  Diagnosis Date  . Anemia   . CAD (coronary artery disease)    a. s/p CABG in 2000  . Carotid artery occlusion   . CKD (chronic kidney disease)   . Diastolic CHF ([employer]) 29/1916  . Fibromyalgia   . GERD (gastroesophageal reflux disease)   . History of CVA (cerebrovascular accident) 05/24/2014  . History of GI bleed   . Hyperlipidemia   . Hypertension   . Obesity   . PAF (paroxysmal atrial fibrillation) (HCC)    a. on Eliquis  . Peripheral vascular disease ([place])   . 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 Type II diabetes mellitus ([place]) dx'd 1979    Patient Active Problem List   Diagnosis Date Noted  . Acute blood loss anemia 12/22/2017  . MRSA bacteremia 12/12/2017  . CKD (chronic kidney disease), stage III ([place]) 12/11/2017  . UTI (urinary tract infection) 12/11/2017  . Foot ulcer, right ([place]) 12/11/2017  . 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 Severe aortic stenosis 10/11/2017  .  Essential hypertension 06/10/2017  . Type II diabetes mellitus with renal manifestations ([place]) 06/10/2017  . Fall   . Fracture of distal end of right fibula 05/29/2014  . History of CVA (cerebrovascular accident) 05/24/2014  . Peripheral vascular disease, unspecified ([place]) 01/11/2014  . CAD (coronary artery disease)   . Paroxysmal atrial fibrillation   . Diabetes mellitus type 2 with peripheral artery disease ([place])   . Hyperlipidemia   . History of GI bleed 04/21/2009  . S/P CABG (coronary artery bypass graft) 11/20/1998    Past Surgical History:  Procedure Laterality Date  . ABDOMINAL AORTAGRAM N/A 04/01/2011   Procedure: ABDOMINAL AORTAGRAM;  Surgeon: Conrad Gordonville, MD;  Location: [facility] CATH LAB;  Service: Cardiovascular;  Laterality: N/A;  . ANGIOPLASTY  06/17/11   Left leg common femoral artery cannulation under u/s Left leg runoff  . CARDIAC CATHETERIZATION  10/11/2017  . CARPAL TUNNEL RELEASE Right   . CATARACT EXTRACTION W/ INTRAOCULAR LENS  IMPLANT, BILATERAL  2004-2005  . CORONARY ARTERY BYPASS GRAFT  2000   CABG X5  . ESOPHAGOGASTRODUODENOSCOPY (EGD) WITH PROPOFOL N/A 12/25/2017   Procedure: ESOPHAGOGASTRODUODENOSCOPY (EGD) WITH PROPOFOL;  Surgeon: Wonda Horner, MD;  Location: [facility] ENDOSCOPY;  Service: Endoscopy;  Laterality: N/A;  . EYE SURGERY Left    "lasered before cataract OR"  . LOWER EXTREMITY ANGIOGRAM Bilateral 04/01/2011   Procedure: LOWER EXTREMITY ANGIOGRAM;  Surgeon: Conrad Meggett, MD;  Location: [facility] CATH LAB;  Service: Cardiovascular;  Laterality: Bilateral;  . LOWER EXTREMITY ANGIOGRAM Left 06/17/2011   Procedure: LOWER EXTREMITY ANGIOGRAM;  Surgeon: Conrad Decatur, MD;  Location: [facility] CATH LAB;  Service: Cardiovascular;  Laterality: Left;  . LOWER EXTREMITY ANGIOGRAM N/A 11/18/2011   Procedure: LOWER EXTREMITY ANGIOGRAM;  Surgeon: Conrad Wilton, MD;  Location: [facility] CATH LAB;  Service: Cardiovascular;  Laterality: N/A;  . PERCUTANEOUS STENT INTERVENTION Right 04/01/2011    Procedure: PERCUTANEOUS STENT INTERVENTION;  Surgeon: Conrad Danielsville, MD;  Location: [facility] CATH LAB;  Service: Cardiovascular;  Laterality: Right;  rt iliac stent  . PERIPHERAL ARTERIAL STENT GRAFT  04/01/11   right common iliac  . RIGHT/LEFT HEART CATH AND CORONARY/GRAFT ANGIOGRAPHY N/A 10/11/2017   Procedure: RIGHT/LEFT HEART CATH AND CORONARY/GRAFT ANGIOGRAPHY;  Surgeon: Sherren Mocha, MD;  Location: [place] CV LAB;  Service: Cardiovascular;  Laterality: N/A;  . TEE WITHOUT CARDIOVERSION N/A 11/08/2017   Procedure: TRANSESOPHAGEAL ECHOCARDIOGRAM (TEE);  Surgeon: Sherren Mocha, MD;  Location: [facility];  Service: Open Heart Surgery;  Laterality: N/A;  . TEE WITHOUT CARDIOVERSION N/A 12/15/2017   Procedure: TRANSESOPHAGEAL ECHOCARDIOGRAM (TEE);  Surgeon: Larey Dresser, MD;  Location: [facility] ENDOSCOPY;  Service: Cardiovascular;  Laterality: N/A;  . TRANSCATHETER AORTIC VALVE REPLACEMENT, TRANSFEMORAL N/A 11/08/2017   Procedure: TRANSCATHETER AORTIC VALVE REPLACEMENT, TRANSFEMORAL;  Surgeon: Sherren Mocha, MD;  Location: [place];  Service: Open Heart Surgery;  Laterality: N/A;  . TRIGGER FINGER RELEASE Left 1996   thumb     OB History   None      Home Medications    Prior to Admission medications   Medication Sig Start Date End Date Taking? Authorizing Provider  acetaminophen (TYLENOL) 500 MG tablet Take 500 mg by mouth daily as needed for moderate pain or headache.    [provider]  amLODipine (NORVASC) 5 MG tablet Take 5 mg by mouth daily.    [provider]  apixaban (ELIQUIS) 5 MG TABS tablet Take 1 tablet (5 mg total) by mouth 2 (two) times daily. 12/26/17   Shelly Coss, MD  aspirin 81 MG chewable tablet Chew 1 tablet (81 mg total) by mouth daily. 11/10/17   Eileen Stanford, PA-C  atorvastatin (LIPITOR) 80 MG tablet Take 1 tablet (80 mg total) by mouth daily. 11/27/12   Baker, Freeman Caldron, PA-C  furosemide (LASIX) 40 MG tablet Take 40 mg by mouth See admin  instructions. Take one tablet (40 mg) by mouth twice daily - morning and afternoon 06/30/17   [provider]  glucose 4 GM chewable tablet Chew 1-4 tablets by mouth once as needed for low blood sugar.     [provider]  insulin NPH Human (HUMULIN N,NOVOLIN N) 100 UNIT/ML injection Inject 0.15 mLs (15 Units total) into the skin 2 (two) times daily. 15 units in the morning and 10 units in the PM 10/12/17   Eileen Stanford, PA-C  insulin regular (NOVOLIN R,HUMULIN R) 100 units/mL injection Inject 5 Units into the skin 3 (three) times daily after meals.     [provider]  losartan (COZAAR) 50 MG tablet Take 1 tablet (50 mg total) by mouth daily. 11/22/17   Eileen Stanford, PA-C  metoprolol tartrate (LOPRESSOR) 25 MG tablet Take 0.5 tablets (12.5 mg total) by mouth 2 (two) times daily. 11/10/17   Eileen Stanford, PA-C  Multiple Vitamin (MULTIVITAMIN WITH MINERALS) TABS tablet Take 1 tablet by mouth daily. One a Day 50 plus    [provider]  nitroGLYCERIN (NITROSTAT) 0.4  MG SL tablet Place 0.4 mg under the tongue every 5 (five) minutes as needed for chest pain.    [provider]  pantoprazole (PROTONIX) 40 MG tablet Take 1 tablet (40 mg total) by mouth 2 (two) times daily. 12/26/17   Shelly Coss, MD  saccharomyces boulardii (FLORASTOR) 250 MG capsule Take 1 capsule (250 mg total) by mouth 2 (two) times daily. 12/16/17   Hosie Poisson, MD  senna-docusate (SENOKOT-S) 8.6-50 MG tablet Take 1 tablet by mouth at bedtime as needed for mild constipation. 12/16/17   Hosie Poisson, MD  vancomycin IVPB Inject 1,000 mg into the vein daily. Indication:  MRSA bacteremia Last Day of Therapy:  01/26/2018 Labs - Sunday/Monday:  CBC/D, BMP, and vancomycin trough. Labs - Thursday:  BMP and vancomycin trough Labs - Every other week:  ESR and CRP 12/26/17 01/26/18  Shelly Coss, MD    Family History Family History  Problem Relation Age of Onset  . Other Brother         intestinal blockage  . Diabetes Brother     Social History Social History   Tobacco Use  . Smoking status: Former Smoker    Packs/day: 2.00    Years: 30.00    Pack years: 60.00    Types: Cigarettes    Last attempt to quit: 04/19/1990    Years since quitting: 27.7  . Smokeless tobacco: Never Used  . Tobacco comment: stopped smoking cigarettes 1991  Substance Use Topics  . Alcohol use: Not Currently    Alcohol/week: 0.0 standard drinks    Comment: "tried different alcohols when I was 1st married; never drank much at  ALL"  . Drug use: Never     Allergies   Morphine and related; Lisinopril; and Zoloft [sertraline hcl]   Review of Systems Review of Systems  Constitutional: Negative for fever.  HENT: Negative for sore throat.   Eyes: Negative for visual disturbance.  Respiratory: Positive for shortness of breath. Negative for cough.   Cardiovascular: Positive for leg swelling. Negative for chest pain.  Gastrointestinal: Negative for abdominal pain and vomiting.  Genitourinary: Negative for flank pain.  Musculoskeletal: Negative for back pain and neck pain.  Skin: Negative for rash.  Neurological: Negative for headaches.  Hematological: Does not bruise/bleed easily.  Psychiatric/Behavioral: Negative for confusion.     Physical Exam Updated Vital Signs Pulse 96   Resp 17   SpO2 99%   Physical Exam  Constitutional: She appears well-developed and well-nourished.  HENT:  Mouth/Throat: Oropharynx is clear and moist.  Eyes: Conjunctivae are normal. No scleral icterus.  Neck: Neck supple. No tracheal deviation present.  Cardiovascular: Normal rate, regular rhythm, normal heart sounds and intact distal pulses. Exam reveals no gallop and no friction rub.  No murmur heard. Pulmonary/Chest: Effort normal and breath sounds normal. No respiratory distress.  Abdominal: Soft. Normal appearance and bowel sounds are normal. She exhibits no distension. There is no  tenderness.  Genitourinary:  Genitourinary Comments: Scant light brown stool. No melena or gross blood. Tests heme pos.   Musculoskeletal: She exhibits edema.  Moderate bil lower leg edema. Small, 2-3 mm superficial foot ulcer, plantar aspect right foot, without cellulitis or purulent discharge.   Neurological: She is alert.  Skin: Skin is warm and dry. No rash noted.  Psychiatric: She has a normal mood and affect.  Nursing note and vitals reviewed.    ED Treatments / Results  Labs (all labs ordered are listed, but only abnormal results are displayed) Results for  orders placed or performed during the hospital encounter of 01/16/18  CBC  Result Value Ref Range   WBC 8.1 4.0 - 10.5 K/uL   RBC 2.85 (L) 3.87 - 5.11 MIL/uL   Hemoglobin 8.4 (L) 12.0 - 15.0 g/dL   HCT 27.8 (L) 36.0 - 46.0 %   MCV 97.5 78.0 - 100.0 fL   MCH 29.5 26.0 - 34.0 pg   MCHC 30.2 30.0 - 36.0 g/dL   RDW 17.2 (H) 11.5 - 15.5 %   Platelets 226 150 - 400 K/uL  Comprehensive metabolic panel  Result Value Ref Range   Sodium 135 135 - 145 mmol/L   Potassium 3.8 3.5 - 5.1 mmol/L   Chloride 104 98 - 111 mmol/L   CO2 23 22 - 32 mmol/L   Glucose, Bld 174 (H) 70 - 99 mg/dL   BUN 26 (H) 8 - 23 mg/dL   Creatinine, Ser 1.25 (H) 0.44 - 1.00 mg/dL   Calcium 8.8 (L) 8.9 - 10.3 mg/dL   Total Protein 7.5 6.5 - 8.1 g/dL   Albumin 2.6 (L) 3.5 - 5.0 g/dL   AST 32 15 - 41 U/L   ALT 17 0 - 44 U/L   Alkaline Phosphatase 117 38 - 126 U/L   Total Bilirubin 0.8 0.3 - 1.2 mg/dL   GFR calc non Af Amer 40 (L) >60 mL/min   GFR calc Af Amer 46 (L) >60 mL/min   Anion gap 8 5 - 15  Brain natriuretic peptide  Result Value Ref Range   B Natriuretic Peptide 724.9 (H) 0.0 - 100.0 pg/mL  Urinalysis, Routine w reflex microscopic  Result Value Ref Range   Color, Urine STRAW (A) YELLOW   APPearance CLEAR CLEAR   Specific Gravity, Urine 1.009 1.005 - 1.030   pH 6.0 5.0 - 8.0   Glucose, UA NEGATIVE NEGATIVE mg/dL   Hgb urine dipstick  SMALL (A) NEGATIVE   Bilirubin Urine NEGATIVE NEGATIVE   Ketones, ur NEGATIVE NEGATIVE mg/dL   Protein, ur 100 (A) NEGATIVE mg/dL   Nitrite NEGATIVE NEGATIVE   Leukocytes, UA NEGATIVE NEGATIVE   RBC / HPF 0-5 0 - 5 RBC/hpf   WBC, UA 0-5 0 - 5 WBC/hpf   Bacteria, UA NONE SEEN NONE SEEN   Squamous Epithelial / LPF 0-5 0 - 5  I-stat troponin, ED  Result Value Ref Range   Troponin i, poc 0.00 0.00 - 0.08 ng/mL   Comment 3          POC occult blood, ED Provider will collect  Result Value Ref Range   Fecal Occult Bld POSITIVE (A) NEGATIVE   Dg Chest Port 1 View  Result Date: 01/16/2018 CLINICAL DATA:  Shortness of Breath over the past several hours EXAM: PORTABLE CHEST 1 VIEW COMPARISON:  12/15/2017 FINDINGS: Cardiac shadow remains enlarged. Postsurgical changes are again seen and stable. Diffuse interstitial prominence is again identified likely of a chronic nature. No focal infiltrate or sizable effusion is seen. No acute bony abnormality is noted. IMPRESSION: Stable appearance of the chest with chronic changes as described. Electronically Signed   By: Inez Catalina M.D.   On: 01/16/2018 09:12    EKG EKG Interpretation  Date/Time:  Monday January 16 2018 08:33:41 EDT Ventricular Rate:  96 PR Interval:    QRS Duration: 125 QT Interval:  383 QTC Calculation: 484 R Axis:   137 Text Interpretation:  Atrial fibrillation Left bundle branch block Non-specific ST-t changes Confirmed by Lajean Saver [phone]) on 01/16/2018 8:43:58  AM   Radiology Dg Chest Port 1 View  Result Date: 01/16/2018 CLINICAL DATA:  Shortness of Breath over the past several hours EXAM: PORTABLE CHEST 1 VIEW COMPARISON:  12/15/2017 FINDINGS: Cardiac shadow remains enlarged. Postsurgical changes are again seen and stable. Diffuse interstitial prominence is again identified likely of a chronic nature. No focal infiltrate or sizable effusion is seen. No acute bony abnormality is noted. IMPRESSION: Stable appearance of the  chest with chronic changes as described. Electronically Signed   By: Inez Catalina M.D.   On: 01/16/2018 09:12    Procedures Procedures (including critical care time)  Medications Ordered in ED Medications - No data to display   Initial Impression / Assessment and Plan / ED Course  I have reviewed the triage vital signs and the nursing notes.  Pertinent labs & imaging results that were available during my care of the patient were reviewed by me and considered in my medical decision making (see chart for details).  Iv ns. Labs. Cxr. Ecg.   Reviewed nursing notes and prior charts for additional history.   Labs reviewed - hgb c/w prior. bnp mildly elev.   cxr reviewed - no pna, no gross edema.  Lasix iv.  Good urine output/response.   Recheck, on room air pulse ox is 95% and is ambulatory to bathroom maintaining sats > 92%.   No chest pain or discomfort on recheck. Delta trop pending.   Recheck pt - pt feels breathing at baseline. sats 97% room air. No pain or discomfort. Delta trop normal. Pt currently appears stable for d/c.     Final Clinical Impressions(s) / ED Diagnoses   Final diagnoses:  None    ED Discharge Orders    None       Lajean Saver, MD 01/17/18 [phone]

## 2018-01-16 NOTE — ED Notes (Signed)
Called patient's daughter at her request [phone]

## 2018-01-16 NOTE — ED Triage Notes (Signed)
Patient presents to ed via [facility] states she was ok last pm woke up at 5am with sob  630 sob got worse and she called 911. Up ems arrival  Patient was sob 02 sats 93% 99% on 2 l/[place] however states patient was demished   [place]  And had some JVD. So they put her on Cpap . Upon arrival to ed patient is feeling much better able to speak in complete sentences.Marland Kitchen

## 2018-01-20 ENCOUNTER — Telehealth: Payer: Self-pay | Admitting: Internal Medicine

## 2018-01-20 NOTE — Telephone Encounter (Signed)
Kristina Summers was recently hospitalized with MRSA bacteremia.  No definite evidence of endocarditis but she has a prosthetic aortic valve.  She is now completed 36 days of IV vancomycin.  Unfortunately she is developing renal insufficiency and her creatinine is up to 1.4.  Her insurance will pay for daptomycin but the home health agent she is working with requires that she would have to come back to the hospital to get her first dose.  I made the decision to have her stop treatment today and have her PICC removed.  We will arrange follow-up in our clinic.

## 2018-01-23 ENCOUNTER — Telehealth (HOSPITAL_COMMUNITY): Payer: Self-pay

## 2018-01-23 NOTE — Telephone Encounter (Signed)
Talk to pt and she stated not recv'ing HHPT at this time.  She interested in participating in the Cardiac Rehab Program.  Patient will come in for orientation on 03/23/18 @ 8:15AM and will attend the 9:45AM exercise class.  Mailed homework package.

## 2018-01-24 ENCOUNTER — Telehealth: Payer: Self-pay | Admitting: Internal Medicine

## 2018-01-26 ENCOUNTER — Encounter: Payer: Self-pay | Admitting: Internal Medicine

## 2018-01-26 ENCOUNTER — Ambulatory Visit (INDEPENDENT_AMBULATORY_CARE_PROVIDER_SITE_OTHER): Payer: Medicare Other | Admitting: Internal Medicine

## 2018-01-26 DIAGNOSIS — I70213 Atherosclerosis of native arteries of extremities with intermittent claudication, bilateral legs: Secondary | ICD-10-CM | POA: Diagnosis not present

## 2018-01-26 DIAGNOSIS — R7881 Bacteremia: Secondary | ICD-10-CM | POA: Diagnosis present

## 2018-01-26 NOTE — Assessment & Plan Note (Signed)
I am hopeful that her MRSA bacteremia has been cured.  I will repeat blood cultures today.  I will also repeat her creatinine to see if her vancomycin induced nephrotoxicity is improving.  She will follow-up in 1 month.

## 2018-01-26 NOTE — Progress Notes (Signed)
Kristina Summers for Infectious Disease  Patient Active Problem List   Diagnosis Date Noted  . MRSA bacteremia 12/12/2017    Priority: High  . S/P TAVR (transcatheter aortic valve replacement) 11/08/2017    Priority: Medium  . Acute blood loss anemia 12/22/2017  . CKD (chronic kidney disease), stage III ([place]) 12/11/2017  . UTI (urinary tract infection) 12/11/2017  . Foot ulcer, right ([place]) 12/11/2017  . Severe aortic stenosis 10/11/2017  . Essential hypertension 06/10/2017  . Type II diabetes mellitus with renal manifestations ([place]) 06/10/2017  . Fall   . Fracture of distal end of right fibula 05/29/2014  . History of CVA (cerebrovascular accident) 05/24/2014  . Peripheral vascular disease, unspecified ([place]) 01/11/2014  . CAD (coronary artery disease)   . Paroxysmal atrial fibrillation   . Diabetes mellitus type 2 with peripheral artery disease ([place])   . Hyperlipidemia   . History of GI bleed 04/21/2009  . S/P CABG (coronary artery bypass graft) 11/20/1998    Patient's Medications  New Prescriptions   No medications on file  Previous Medications   ACETAMINOPHEN (TYLENOL) 500 MG TABLET    Take 500 mg by mouth daily as needed for moderate pain or headache.   AMLODIPINE (NORVASC) 5 MG TABLET    Take 5 mg by mouth daily.   APIXABAN (ELIQUIS) 5 MG TABS TABLET    Take 1 tablet (5 mg total) by mouth 2 (two) times daily.   ASPIRIN 81 MG CHEWABLE TABLET    Chew 1 tablet (81 mg total) by mouth daily.   ATORVASTATIN (LIPITOR) 80 MG TABLET    Take 1 tablet (80 mg total) by mouth daily.   FUROSEMIDE (LASIX) 40 MG TABLET    Take 40 mg by mouth 2 (two) times daily.    GLUCOSE 4 GM CHEWABLE TABLET    Chew 1-4 tablets by mouth once as needed for low blood sugar.    INSULIN NPH HUMAN (HUMULIN N,NOVOLIN N) 100 UNIT/ML INJECTION    Inject 0.15 mLs (15 Units total) into the skin 2 (two) times daily. 15 units in the morning and 10 units in the PM   INSULIN REGULAR (NOVOLIN R,HUMULIN R)  100 UNITS/ML INJECTION    Inject 5 Units into the skin 3 (three) times daily after meals.    LOSARTAN (COZAAR) 50 MG TABLET    Take 1 tablet (50 mg total) by mouth daily.   METOPROLOL TARTRATE (LOPRESSOR) 25 MG TABLET    Take 0.5 tablets (12.5 mg total) by mouth 2 (two) times daily.   MULTIPLE VITAMIN (MULTIVITAMIN WITH MINERALS) TABS TABLET    Take 1 tablet by mouth daily. One a Day 50 plus   NITROGLYCERIN (NITROSTAT) 0.4 MG SL TABLET    Place 0.4 mg under the tongue every 5 (five) minutes as needed for chest pain.   PANTOPRAZOLE (PROTONIX) 40 MG TABLET    Take 1 tablet (40 mg total) by mouth 2 (two) times daily.   SACCHAROMYCES BOULARDII (FLORASTOR) 250 MG CAPSULE    Take 1 capsule (250 mg total) by mouth 2 (two) times daily.   SENNA-DOCUSATE (SENOKOT-S) 8.6-50 MG TABLET    Take 1 tablet by mouth at bedtime as needed for mild constipation.  Modified Medications   No medications on file  Discontinued Medications   VANCOMYCIN IVPB    Inject 1,000 mg into the vein daily. Indication:  MRSA bacteremia Last Day of Therapy:  01/26/2018 Labs - Sunday/Monday:  CBC/D, BMP, and  vancomycin trough. Labs - Thursday:  BMP and vancomycin trough Labs - Every other week:  ESR and CRP    Subjective: Kristina Summers is in for her routine hospital follow-up visit.  She underwent TAVR about 2 months ago.  She did well initially then developed fever and confusion leading to readmission on 12/11/2017.  She was found to have MRSA bacteremia.  She was started on vancomycin and follow-up blood cultures became negative quickly.  She had no evidence of endocarditis clinically or by TEE.  She completed 36 days of IV vancomycin on 01/20/2018 and had her PICC removed.  Her creatinine had increased to 1.4 leading me to stop her vancomycin little earlier than expected.  She did not have any other problems tolerating her vancomycin or PICC.  She has not had any recurrent fever, chills or sweats.  Review of Systems: Review of Systems    Constitutional: Negative for chills, diaphoresis and fever.  Respiratory: Negative for cough and shortness of breath.   Cardiovascular: Negative for chest pain.  Gastrointestinal: Negative for abdominal pain, diarrhea, nausea and vomiting.  Genitourinary: Negative for dysuria.    Past Medical History:  Diagnosis Date  . Anemia   . CAD (coronary artery disease)    a. s/p CABG in 2000  . Carotid artery occlusion   . CKD (chronic kidney disease)   . Diastolic CHF ([place]) 17/5102  . Fibromyalgia   . GERD (gastroesophageal reflux disease)   . History of CVA (cerebrovascular accident) 05/24/2014  . History of GI bleed   . Hyperlipidemia   . Hypertension   . Obesity   . PAF (paroxysmal atrial fibrillation) (HCC)    a. on Eliquis  . Peripheral vascular disease ([place])   . 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 Type II diabetes mellitus ([place]) dx'd 1979    Social History   Tobacco Use  . Smoking status: Former Smoker    Packs/day: 2.00    Years: 30.00    Pack years: 60.00    Types: Cigarettes    Last attempt to quit: 04/19/1990    Years since quitting: 27.7  . Smokeless tobacco: Never Used  . Tobacco comment: stopped smoking cigarettes 1991  Substance Use Topics  . Alcohol use: Not Currently    Alcohol/week: 0.0 standard drinks    Comment: "tried different alcohols when I was 1st married; never drank much at  ALL"  . Drug use: Never    Family History  Problem Relation Age of Onset  . Other Brother        intestinal blockage  . Diabetes Brother     Allergies  Allergen Reactions  . Morphine And Related Nausea And Vomiting and Other (See Comments)    Chest pain, also   . Lisinopril Cough  . Zoloft [Sertraline Hcl] Nausea Only         Objective: Vitals:   01/26/18 1058  BP: (!) 146/63  Pulse: 82  Resp: (!) 24  Temp: 97.6 F (36.4 C)   TempSrc: Oral  SpO2: 97%  Weight: 191 lb (86.6 kg)  Height: '5\' 6"'$  (1.676 m)   Body mass index is 30.83 kg/m.  Physical Exam  Constitutional: She is oriented to person, place, and time.  She is in good spirits.  Cardiovascular: Normal rate, regular rhythm and normal heart sounds.  No murmur heard. Pulmonary/Chest: Effort normal and breath sounds normal.  Neurological: She is alert and oriented to person, place, and time.  Skin: No rash noted.  Psychiatric: She has a normal mood and affect.    Lab Results    Problem List Items Addressed This Visit      High   MRSA bacteremia    I am hopeful that her MRSA bacteremia has been cured.  I will repeat blood cultures today.  I will also repeat her creatinine to see if her vancomycin induced nephrotoxicity is improving.  She will follow-up in 1 month.      Relevant Orders   Basic metabolic panel   Culture, blood (single)   Culture, blood (single)       Michel Bickers, MD [facility] [place] [phone] pager   [phone] cell 01/26/2018, 11:17 AM

## 2018-02-02 LAB — BASIC METABOLIC PANEL
BUN / CREAT RATIO: 24 (calc) — AB (ref 6–22)
BUN: 37 mg/dL — ABNORMAL HIGH (ref 7–25)
CHLORIDE: 106 mmol/L (ref 98–110)
CO2: 26 mmol/L (ref 20–32)
CREATININE: 1.54 mg/dL — AB (ref 0.60–0.88)
Calcium: 8.7 mg/dL (ref 8.6–10.4)
Glucose, Bld: 154 mg/dL — ABNORMAL HIGH (ref 65–99)
POTASSIUM: 3.6 mmol/L (ref 3.5–5.3)
Sodium: 138 mmol/L (ref 135–146)

## 2018-02-02 LAB — CULTURE BLOOD MANUAL
MICRO NUMBER 7002: 91225233
Micro Number: 91225235
RESULT 123: NO GROWTH
Result: NO GROWTH
SPECIMEN QUALITY 12: ADEQUATE
Specimen Quality: ADEQUATE

## 2018-02-03 DIAGNOSIS — H9190 Unspecified hearing loss, unspecified ear: Secondary | ICD-10-CM | POA: Insufficient documentation

## 2018-02-03 DIAGNOSIS — I252 Old myocardial infarction: Secondary | ICD-10-CM | POA: Insufficient documentation

## 2018-02-03 DIAGNOSIS — A4902 Methicillin resistant Staphylococcus aureus infection, unspecified site: Secondary | ICD-10-CM | POA: Insufficient documentation

## 2018-02-07 ENCOUNTER — Encounter: Payer: Self-pay | Admitting: Cardiology

## 2018-02-07 ENCOUNTER — Ambulatory Visit (INDEPENDENT_AMBULATORY_CARE_PROVIDER_SITE_OTHER): Payer: Medicare Other | Admitting: Cardiology

## 2018-02-07 VITALS — BP 122/54 | HR 64 | Ht 66.0 in | Wt 191.8 lb

## 2018-02-07 DIAGNOSIS — Z952 Presence of prosthetic heart valve: Secondary | ICD-10-CM | POA: Diagnosis not present

## 2018-02-07 DIAGNOSIS — Z951 Presence of aortocoronary bypass graft: Secondary | ICD-10-CM

## 2018-02-07 DIAGNOSIS — N183 Chronic kidney disease, stage 3 unspecified: Secondary | ICD-10-CM

## 2018-02-07 DIAGNOSIS — E782 Mixed hyperlipidemia: Secondary | ICD-10-CM

## 2018-02-07 DIAGNOSIS — I480 Paroxysmal atrial fibrillation: Secondary | ICD-10-CM

## 2018-02-07 NOTE — Progress Notes (Signed)
Cardiology Office Note:    Date:  02/07/2018   ID:  Kristina Summers, DOB 12-17-1935, MRN [id number]  PCP:  Care, [place]  Cardiologist:  Jenne Campus, MD    Referring MD: Care, [facility]   Chief Complaint  Patient presents with  . Follow-up  Doing well  History of Present Illness:    Kristina Summers is a 82 y.o. female who does have very complex past medical history that include coronary artery bypass graft many years ago after that she required some stenting.  Also recently in the summertime she underwear trans-angiographic valve implantation in aortic position.  She did quite well and improved after that procedure.  She is able to walk more she does not have any tightness pressure burning in the chest does not have much shortness of breath.  Overall she lives a fairly sedentary lifestyle but in order for visit and her friend she has to walk a lot and she does not.  She does have diabetes which is brittle but overall seems to be well controlled.  Does have paroxysmal atrial fibrillation she is anticoagulated with Eliquis.  She is also on aspirin because of bioprosthetic valve.  Past Medical History:  Diagnosis Date  . Anemia   . CAD (coronary artery disease)    a. s/p CABG in 2000  . Carotid artery occlusion   . CKD (chronic kidney disease)   . Diastolic CHF ([place]) 18/5631  . Fibromyalgia   . GERD (gastroesophageal reflux disease)   . History of CVA (cerebrovascular accident) 05/24/2014  . History of GI bleed   . Hyperlipidemia   . Hypertension   . Obesity   . PAF (paroxysmal atrial fibrillation) (HCC)    a. on Eliquis  . Peripheral vascular disease ([place])   . 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 Type II diabetes mellitus ([place]) dx'd 1979    Past Surgical History:  Procedure Laterality Date  . ABDOMINAL AORTAGRAM N/A 04/01/2011   Procedure:  ABDOMINAL AORTAGRAM;  Surgeon: Conrad [place], MD;  Location: [facility] CATH LAB;  Service: Cardiovascular;  Laterality: N/A;  . ANGIOPLASTY  06/17/11   Left leg common femoral artery cannulation under u/s Left leg runoff  . CARDIAC CATHETERIZATION  10/11/2017  . CARPAL TUNNEL RELEASE Right   . CATARACT EXTRACTION W/ INTRAOCULAR LENS  IMPLANT, BILATERAL  2004-2005  . CORONARY ARTERY BYPASS GRAFT  2000   CABG X5  . ESOPHAGOGASTRODUODENOSCOPY (EGD) WITH PROPOFOL N/A 12/25/2017   Procedure: ESOPHAGOGASTRODUODENOSCOPY (EGD) WITH PROPOFOL;  Surgeon: Wonda Horner, MD;  Location: [facility] ENDOSCOPY;  Service: Endoscopy;  Laterality: N/A;  . EYE SURGERY Left    "lasered before cataract OR"  . LOWER EXTREMITY ANGIOGRAM Bilateral 04/01/2011   Procedure: LOWER EXTREMITY ANGIOGRAM;  Surgeon: Conrad [place], MD;  Location: [facility] CATH LAB;  Service: Cardiovascular;  Laterality: Bilateral;  . LOWER EXTREMITY ANGIOGRAM Left 06/17/2011   Procedure: LOWER EXTREMITY ANGIOGRAM;  Surgeon: Conrad [place], MD;  Location: [facility] CATH LAB;  Service: Cardiovascular;  Laterality: Left;  . LOWER EXTREMITY ANGIOGRAM N/A 11/18/2011   Procedure: LOWER EXTREMITY ANGIOGRAM;  Surgeon: Conrad [place], MD;  Location: [facility] CATH LAB;  Service: Cardiovascular;  Laterality: N/A;  . PERCUTANEOUS STENT INTERVENTION Right 04/01/2011   Procedure: PERCUTANEOUS STENT INTERVENTION;  Surgeon: Conrad , MD;  Location: [facility] CATH LAB;  Service: Cardiovascular;  Laterality: Right;  rt iliac stent  .  PERIPHERAL ARTERIAL STENT GRAFT  04/01/11   right common iliac  . RIGHT/LEFT HEART CATH AND CORONARY/GRAFT ANGIOGRAPHY N/A 10/11/2017   Procedure: RIGHT/LEFT HEART CATH AND CORONARY/GRAFT ANGIOGRAPHY;  Surgeon: Sherren Mocha, MD;  Location: [place] CV LAB;  Service: Cardiovascular;  Laterality: N/A;  . TEE WITHOUT CARDIOVERSION N/A 11/08/2017   Procedure: TRANSESOPHAGEAL ECHOCARDIOGRAM (TEE);  Surgeon: Sherren Mocha, MD;  Location: [place];  Service: Open Heart Surgery;   Laterality: N/A;  . TEE WITHOUT CARDIOVERSION N/A 12/15/2017   Procedure: TRANSESOPHAGEAL ECHOCARDIOGRAM (TEE);  Surgeon: Larey Dresser, MD;  Location: [facility] ENDOSCOPY;  Service: Cardiovascular;  Laterality: N/A;  . TRANSCATHETER AORTIC VALVE REPLACEMENT, TRANSFEMORAL N/A 11/08/2017   Procedure: TRANSCATHETER AORTIC VALVE REPLACEMENT, TRANSFEMORAL;  Surgeon: Sherren Mocha, MD;  Location: [place];  Service: Open Heart Surgery;  Laterality: N/A;  . TRIGGER FINGER RELEASE Left 1996   thumb    Current Medications: Current Meds  Medication Sig  . acetaminophen (TYLENOL) 500 MG tablet Take 500 mg by mouth daily as needed for moderate pain or headache.  Marland Kitchen amLODipine (NORVASC) 5 MG tablet Take 5 mg by mouth daily.  Marland Kitchen apixaban (ELIQUIS) 5 MG TABS tablet Take 1 tablet (5 mg total) by mouth 2 (two) times daily.  Marland Kitchen aspirin 81 MG chewable tablet Chew 1 tablet (81 mg total) by mouth daily.  Marland Kitchen atorvastatin (LIPITOR) 80 MG tablet Take 1 tablet (80 mg total) by mouth daily.  . furosemide (LASIX) 40 MG tablet Take 40 mg by mouth 3 (three) times daily.   Marland Kitchen glucose 4 GM chewable tablet Chew 1-4 tablets by mouth once as needed for low blood sugar.   . insulin NPH Human (HUMULIN N,NOVOLIN N) 100 UNIT/ML injection Inject 0.15 mLs (15 Units total) into the skin 2 (two) times daily. 15 units in the morning and 10 units in the PM (Patient taking differently: Inject 37 Units into the skin 2 (two) times daily. )  . insulin regular (NOVOLIN R,HUMULIN R) 100 units/mL injection Inject 5 Units into the skin 3 (three) times daily after meals.   Marland Kitchen losartan (COZAAR) 50 MG tablet Take 1 tablet (50 mg total) by mouth daily.  . metoprolol tartrate (LOPRESSOR) 25 MG tablet Take 0.5 tablets (12.5 mg total) by mouth 2 (two) times daily.  . Multiple Vitamin (MULTIVITAMIN WITH MINERALS) TABS tablet Take 1 tablet by mouth daily. One a Day 50 plus  . nitroGLYCERIN (NITROSTAT) 0.4 MG SL tablet Place 0.4 mg under the tongue every 5 (five)  minutes as needed for chest pain.     Allergies:   Morphine and related; Lisinopril; and Zoloft [sertraline hcl]   Social History   Socioeconomic History  . Marital status: Divorced    Spouse name: Not on file  . Number of children: 3  . Years of education: 34  . Highest education level: Not on file  Occupational History  . Occupation: Retired  Scientific laboratory technician  . Financial resource strain: Not on file  . Food insecurity:    Worry: Not on file    Inability: Not on file  . Transportation needs:    Medical: Not on file    Non-medical: Not on file  Tobacco Use  . Smoking status: Former Smoker    Packs/day: 2.00    Years: 30.00    Pack years: 60.00    Types: Cigarettes    Last attempt to quit: 04/19/1990    Years since quitting: 27.8  . Smokeless tobacco: Never Used  . Tobacco comment:  stopped smoking cigarettes 1991  Substance and Sexual Activity  . Alcohol use: Not Currently    Alcohol/week: 0.0 standard drinks    Comment: "tried different alcohols when I was 1st married; never drank much at  ALL"  . Drug use: Never  . Sexual activity: Not Currently  Lifestyle  . Physical activity:    Days per week: Not on file    Minutes per session: Not on file  . Stress: Not on file  Relationships  . Social connections:    Talks on phone: Not on file    Gets together: Not on file    Attends religious service: Not on file    Active member of club or organization: Not on file    Attends meetings of clubs or organizations: Not on file    Relationship status: Not on file  Other Topics Concern  . Not on file  Social History Narrative   Divorced.  Native of [place].  Formerly worked as Education administrator person. 01/13/17 lives alone   Caffeine use: drinks decaf tea and coffee     Family History: The patient's family history includes Diabetes in her brother; Other in her brother. ROS:   Please see the history of present illness.    All 14 point review of systems negative except as described  per history of present illness  EKGs/Labs/Other Studies Reviewed:      Recent Labs: 12/14/2017: Magnesium 2.2 01/16/2018: ALT 17; B Natriuretic Peptide 724.9; Hemoglobin 8.4; Platelets 226 01/26/2018: BUN 37; Creat 1.54; Potassium 3.6; Sodium 138  Recent Lipid Panel    Component Value Date/Time   CHOL 92 18-Oct-202016 1006   TRIG 165 (H) 18-Oct-202016 1006   HDL 11 (L) 18-Oct-202016 1006   CHOLHDL 8.4 18-Oct-202016 1006   VLDL 33 18-Oct-202016 1006   LDLCALC 48 18-Oct-202016 1006    Physical Exam:    VS:  BP (!) 122/54   Pulse 64   Ht 5\' 6"  (1.676 m)   Wt 191 lb 12.8 oz (87 kg)   SpO2 97%   BMI 30.96 kg/m     Wt Readings from Last 3 Encounters:  02/07/18 191 lb 12.8 oz (87 kg)  01/26/18 191 lb (86.6 kg)  01/16/18 189 lb 13.1 oz (86.1 kg)     GEN:  Well nourished, well developed in no acute distress HEENT: Normal NECK: No JVD; No carotid bruits LYMPHATICS: No lymphadenopathy CARDIAC: RRR, soft systolic ejection murmur grade 1/6 best heard at the right upper portion of the sternum, no rubs, no gallops RESPIRATORY:  Clear to auscultation without rales, wheezing or rhonchi  ABDOMEN: Soft, non-tender, non-distended MUSCULOSKELETAL:  No edema; No deformity  SKIN: Warm and dry LOWER EXTREMITIES: Minimal swelling there is some ulcer in the right foot that been taking care of by wound care center. NEUROLOGIC:  Alert and oriented x 3 PSYCHIATRIC:  Normal affect   ASSESSMENT:    1. S/P TAVR (transcatheter aortic valve replacement)   2. S/P CABG (coronary artery bypass graft)   3. Mixed hyperlipidemia   4. CKD (chronic kidney disease), stage III (HCC)   5. Paroxysmal atrial fibrillation (HCC)    PLAN:    In order of problems listed above:  1. Status post TAVR clinically doing very well.  We will continue present management.  Echocardiogram reviewed from the hospital. 2. Coronary artery disease status post coronary artery bypass graft many years ago.  Last evaluation of coronary artery  disease was made in the summer cardiac catheterization was done  as preparation for TAVR did not have any critical obstructive lesions.  We will continue aggressive risk factors modifications. 3. Paroxysmal atrial fibrillation her heart rate appears to be regular today he is anticoagulated I will continue. 4. Essential hypertension blood pressure well controlled we will continue present management. 5. Dyslipidemia we will check a fasting lipid profile today.  Overall she is a delightful lady with some TAVR doing quite well clinically.   Medication Adjustments/Labs and Tests Ordered: Current medicines are reviewed at length with the patient today.  Concerns regarding medicines are outlined above.  No orders of the defined types were placed in this encounter.  Medication changes: No orders of the defined types were placed in this encounter.   Signed, Park Liter, MD, [facility] 02/07/2018 11:22 AM    [place]

## 2018-02-07 NOTE — Patient Instructions (Signed)
Medication Instructions:  Your physician recommends that you continue on your current medications as directed. Please refer to the Current Medication list given to you today.  If you need a refill on your cardiac medications before your next appointment, please call your pharmacy.   Lab work: Your physician recommends that you return for lab work today: CBC and Lipids.   If you have labs (blood work) drawn today and your tests are completely normal, you will receive your results only by: Marland Kitchen MyChart Message (if you have MyChart) OR . A paper copy in the mail If you have any lab test that is abnormal or we need to change your treatment, we will call you to review the results.  Testing/Procedures: None.  Follow-Up: At [facility], you and your health needs are our priority.  As part of our continuing mission to provide you with exceptional heart care, we have created designated Provider Care Teams.  These Care Teams include your primary Cardiologist (physician) and Advanced Practice Providers (APPs -  Physician Assistants and Nurse Practitioners) who all work together to provide you with the care you need, when you need it. You will need a follow up appointment in 3 months.  Please call our office 2 months in advance to schedule this appointment.  You may see No primary care provider on file. or another member of our [employer] Provider Team in [place]: Shirlee More, MD . Jyl Heinz, MD  Any Other Special Instructions Will Be Listed Below (If Applicable).

## 2018-02-08 LAB — LIPID PANEL
CHOLESTEROL TOTAL: 111 mg/dL (ref 100–199)
Chol/HDL Ratio: 3.4 ratio (ref 0.0–4.4)
HDL: 33 mg/dL — ABNORMAL LOW (ref >39)
LDL Calculated: 61 mg/dL (ref 0–99)
TRIGLYCERIDES: 86 mg/dL (ref 0–149)
VLDL CHOLESTEROL CAL: 17 mg/dL (ref 5–40)

## 2018-02-08 LAB — CBC
HEMOGLOBIN: 8.3 g/dL — AB (ref 11.1–15.9)
Hematocrit: 26.7 % — ABNORMAL LOW (ref 34.0–46.6)
MCH: 28.9 pg (ref 26.6–33.0)
MCHC: 31.1 g/dL — ABNORMAL LOW (ref 31.5–35.7)
MCV: 93 fL (ref 79–97)
Platelets: 268 10*3/uL (ref 150–450)
RBC: 2.87 x10E6/uL — AB (ref 3.77–5.28)
RDW: 15.5 % — ABNORMAL HIGH (ref 12.3–15.4)
WBC: 7.5 10*3/uL (ref 3.4–10.8)

## 2018-02-14 ENCOUNTER — Telehealth: Payer: Self-pay | Admitting: Emergency Medicine

## 2018-02-14 DIAGNOSIS — D62 Acute posthemorrhagic anemia: Secondary | ICD-10-CM

## 2018-02-14 NOTE — Telephone Encounter (Signed)
Patient informed of lab results. She will have labs redrawn in 2 weeks.

## 2018-02-22 ENCOUNTER — Telehealth (HOSPITAL_COMMUNITY): Payer: Self-pay

## 2018-02-27 ENCOUNTER — Other Ambulatory Visit: Payer: Self-pay | Admitting: Internal Medicine

## 2018-02-27 ENCOUNTER — Ambulatory Visit: Payer: Medicare Other | Admitting: Internal Medicine

## 2018-02-27 DIAGNOSIS — Z780 Asymptomatic menopausal state: Secondary | ICD-10-CM

## 2018-02-27 DIAGNOSIS — M8580 Other specified disorders of bone density and structure, unspecified site: Secondary | ICD-10-CM

## 2018-02-28 ENCOUNTER — Telehealth (HOSPITAL_COMMUNITY): Payer: Self-pay

## 2018-03-01 ENCOUNTER — Telehealth: Payer: Self-pay | Admitting: Emergency Medicine

## 2018-03-01 DIAGNOSIS — D62 Acute posthemorrhagic anemia: Secondary | ICD-10-CM

## 2018-03-01 LAB — CBC
Hematocrit: 23.3 % — ABNORMAL LOW (ref 34.0–46.6)
Hemoglobin: 7.3 g/dL — ABNORMAL LOW (ref 11.1–15.9)
MCH: 28 pg (ref 26.6–33.0)
MCHC: 31.3 g/dL — ABNORMAL LOW (ref 31.5–35.7)
MCV: 89 fL (ref 79–97)
PLATELETS: 228 10*3/uL (ref 150–450)
RBC: 2.61 x10E6/uL — CL (ref 3.77–5.28)
RDW: 14 % (ref 12.3–15.4)
WBC: 6.8 10*3/uL (ref 3.4–10.8)

## 2018-03-01 NOTE — Telephone Encounter (Signed)
Patient informed of lab results, to stop aspirin and to have labs redrawn on Monday 03/06/18.

## 2018-03-03 ENCOUNTER — Other Ambulatory Visit: Payer: Self-pay

## 2018-03-03 DIAGNOSIS — L98499 Non-pressure chronic ulcer of skin of other sites with unspecified severity: Secondary | ICD-10-CM

## 2018-03-03 DIAGNOSIS — I70213 Atherosclerosis of native arteries of extremities with intermittent claudication, bilateral legs: Secondary | ICD-10-CM

## 2018-03-06 ENCOUNTER — Ambulatory Visit (HOSPITAL_COMMUNITY)
Admission: RE | Admit: 2018-03-06 | Discharge: 2018-03-06 | Disposition: A | Discharge status: Home / Self Care | Payer: Medicare Other | Source: Ambulatory Visit | Attending: Family | Admitting: Family

## 2018-03-06 ENCOUNTER — Ambulatory Visit (INDEPENDENT_AMBULATORY_CARE_PROVIDER_SITE_OTHER)
Admission: RE | Admit: 2018-03-06 | Discharge: 2018-03-06 | Disposition: A | Discharge status: Home / Self Care | Payer: Medicare Other | Source: Ambulatory Visit | Attending: Family | Admitting: Family

## 2018-03-06 DIAGNOSIS — I70213 Atherosclerosis of native arteries of extremities with intermittent claudication, bilateral legs: Secondary | ICD-10-CM | POA: Diagnosis present

## 2018-03-06 DIAGNOSIS — L98499 Non-pressure chronic ulcer of skin of other sites with unspecified severity: Secondary | ICD-10-CM | POA: Insufficient documentation

## 2018-03-07 ENCOUNTER — Other Ambulatory Visit: Payer: Self-pay

## 2018-03-07 ENCOUNTER — Encounter: Payer: Self-pay | Admitting: Family

## 2018-03-07 ENCOUNTER — Ambulatory Visit (INDEPENDENT_AMBULATORY_CARE_PROVIDER_SITE_OTHER): Payer: Medicare Other | Admitting: Family

## 2018-03-07 ENCOUNTER — Other Ambulatory Visit: Payer: Self-pay | Admitting: *Deleted

## 2018-03-07 ENCOUNTER — Ambulatory Visit (HOSPITAL_COMMUNITY)
Admission: RE | Admit: 2018-03-07 | Discharge: 2018-03-07 | Disposition: A | Discharge status: Home / Self Care | Payer: Medicare Other | Source: Ambulatory Visit | Attending: Family | Admitting: Family

## 2018-03-07 VITALS — BP 149/55 | HR 85 | Temp 98.1°F | Resp 20 | Ht 66.0 in | Wt 189.0 lb

## 2018-03-07 DIAGNOSIS — I70213 Atherosclerosis of native arteries of extremities with intermittent claudication, bilateral legs: Secondary | ICD-10-CM | POA: Diagnosis not present

## 2018-03-07 DIAGNOSIS — R600 Localized edema: Secondary | ICD-10-CM

## 2018-03-07 DIAGNOSIS — M79605 Pain in left leg: Secondary | ICD-10-CM | POA: Diagnosis present

## 2018-03-07 DIAGNOSIS — M79604 Pain in right leg: Secondary | ICD-10-CM

## 2018-03-07 DIAGNOSIS — I872 Venous insufficiency (chronic) (peripheral): Secondary | ICD-10-CM | POA: Diagnosis not present

## 2018-03-07 DIAGNOSIS — L98499 Non-pressure chronic ulcer of skin of other sites with unspecified severity: Secondary | ICD-10-CM | POA: Diagnosis not present

## 2018-03-07 DIAGNOSIS — I5032 Chronic diastolic (congestive) heart failure: Secondary | ICD-10-CM

## 2018-03-07 DIAGNOSIS — R0602 Shortness of breath: Secondary | ICD-10-CM

## 2018-03-07 NOTE — Patient Instructions (Signed)
  To decrease swelling in your feet and legs: Elevate feet above slightly bent knees, feet above heart, overnight and 3-4 times per day for 20 minutes.   To measure for knee high compression hose: Measure the length of calf (from the crease of the knee to the bottom of the heel), largest circumference of calf, and ankle circumference first thing in the morning before your legs have a chance to swell.  Take these 3 measurements with you to obtain 20-30 mm mercury graduated knee high compression hose.  Put the stockings on in the morning, remove at bedtime.

## 2018-03-07 NOTE — Progress Notes (Signed)
[facility] [place]   CC: Follow up peripheral artery occlusive disease  History of Present Illness Kristina Summers is a 82 y.o. female who is s/p right common iliac artery stent on 04/01/11; Left superficial femoral artery atherectomy and PTA on 06/17/11; Left superficial femoral artery PTA on 11/18/11 by Dr. Bridgett Larsson.  Dr. Bridgett Larsson last evaluated pt on 07-06-17. At that time patient was overwhelmingly upset over her life circumstances, so Dr. Bridgett Larsson was not certain she was able to process their conversation. Per the patient, she was healing her R foot ulcer, so Dr. Bridgett Larsson would not pursue further investigations immediately per her wishes. There was a significant drop off in blood flow as evident by the R ABI decrease, Dr, Bridgett Larsson indicated that he suspects she had developed significant RLE PAD at that point, as would be suspected given her DM. Based on the patient's vascular studies and examination, Dr. Bridgett Larsson offered the patient: q3 ABI, LLE arterial duplex. If the ABI remain decreased or the ulcer is still present, would strongly suggest proceeding with angiography and possible intervention on the R leg.  Pt states that she had a stroke about 2016, she currently has some degree of expressive aphasia.   Pt states she has been dyspneic for about 3 days and is worsening. She is not dyspneic at rest, but after walking a short distance. She has not told her PCP, has not seen her PCP since she has been dyspneic.   She had a TAVR in the Summer fo 2019, states her breathing improved after this, but in the last 3 days her breathing feels like it did before the TAVR.  She was seen by her cardiologist on 02-07-18.  She has been seen by ID for MRSA this Summer (2019).   Diabetic: Yes, A1C in May, 2015 was 11.1, uncontrolled, was 6.0 on 11-04-17, in good control Tobacco use: former smoker, quit in 1991  Pt meds include: Statin :Yes ASA: no Other anticoagulants/antiplatelets: Eliquis for atrial  fib   Past Medical History:  Diagnosis Date  . Anemia   . CAD (coronary artery disease)    a. s/p CABG in 2000  . Carotid artery occlusion   . CKD (chronic kidney disease)   . Diastolic CHF ([place]) 17/5102  . Fibromyalgia   . GERD (gastroesophageal reflux disease)   . History of CVA (cerebrovascular accident) 05/24/2014  . History of GI bleed   . Hyperlipidemia   . Hypertension   . Obesity   . PAF (paroxysmal atrial fibrillation) (HCC)    a. on Eliquis  . Peripheral vascular disease ([place])   . 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 Type II diabetes mellitus ([place]) dx'd 67    Social History Social History   Tobacco Use  . Smoking status: Former Smoker    Packs/day: 2.00    Years: 30.00    Pack years: 60.00    Types: Cigarettes    Last attempt to quit: 04/19/1990    Years since quitting: 27.9  . Smokeless tobacco: Never Used  . Tobacco comment: stopped smoking cigarettes 1991  Substance Use Topics  . Alcohol use: Not Currently    Alcohol/week: 0.0 standard drinks    Comment: "tried different alcohols when I was 1st married; never drank much at  ALL"  . Drug use: Never    Family History Family History  Problem Relation Age  of Onset  . Other Brother        intestinal blockage  . Diabetes Brother     Past Surgical History:  Procedure Laterality Date  . ABDOMINAL AORTAGRAM N/A 04/01/2011   Procedure: ABDOMINAL AORTAGRAM;  Surgeon: Conrad Reliance, MD;  Location: [facility] CATH LAB;  Service: Cardiovascular;  Laterality: N/A;  . ANGIOPLASTY  06/17/11   Left leg common femoral artery cannulation under u/s Left leg runoff  . CARDIAC CATHETERIZATION  10/11/2017  . CARPAL TUNNEL RELEASE Right   . CATARACT EXTRACTION W/ INTRAOCULAR LENS  IMPLANT, BILATERAL  2004-2005  . CORONARY ARTERY BYPASS GRAFT  2000   CABG X5  . ESOPHAGOGASTRODUODENOSCOPY (EGD) WITH  PROPOFOL N/A 12/25/2017   Procedure: ESOPHAGOGASTRODUODENOSCOPY (EGD) WITH PROPOFOL;  Surgeon: Wonda Horner, MD;  Location: [facility] ENDOSCOPY;  Service: Endoscopy;  Laterality: N/A;  . EYE SURGERY Left    "lasered before cataract OR"  . LOWER EXTREMITY ANGIOGRAM Bilateral 04/01/2011   Procedure: LOWER EXTREMITY ANGIOGRAM;  Surgeon: Conrad Dundee, MD;  Location: [facility] CATH LAB;  Service: Cardiovascular;  Laterality: Bilateral;  . LOWER EXTREMITY ANGIOGRAM Left 06/17/2011   Procedure: LOWER EXTREMITY ANGIOGRAM;  Surgeon: Conrad Piedmont, MD;  Location: [facility] CATH LAB;  Service: Cardiovascular;  Laterality: Left;  . LOWER EXTREMITY ANGIOGRAM N/A 11/18/2011   Procedure: LOWER EXTREMITY ANGIOGRAM;  Surgeon: Conrad Mingo, MD;  Location: [facility] CATH LAB;  Service: Cardiovascular;  Laterality: N/A;  . PERCUTANEOUS STENT INTERVENTION Right 04/01/2011   Procedure: PERCUTANEOUS STENT INTERVENTION;  Surgeon: Conrad Wilderness Rim, MD;  Location: [facility] C[facility] LAB;  Service: Cardiovascular;  Laterality: Right;  rt iliac stent  . PERIPHERAL ARTERIAL STENT GRAFT  04/01/11   right common iliac  . RIGHT/LEFT HEART CATH AND CORONARY/GRAFT ANGIOGRAPHY N/A 10/11/2017   Procedure: RIGHT/LEFT HEART CATH AND CORONARY/GRAFT ANGIOGRAPHY;  Surgeon: Sherren Mocha, MD;  Location: [place] CV LAB;  Service: Cardiovascular;  Laterality: N/A;  . TEE WITHOUT CARDIOVERSION N/A 11/08/2017   Procedure: TRANSESOPHAGEAL ECHOCARDIOGRAM (TEE);  Surgeon: Sherren Mocha, MD;  Location: [place];  Service: Open Heart Surgery;  Laterality: N/A;  . TEE WITHOUT CARDIOVERSION N/A 12/15/2017   Procedure: TRANSESOPHAGEAL ECHOCARDIOGRAM (TEE);  Surgeon: Larey Dresser, MD;  Location: [facility] ENDOSCOPY;  Service: Cardiovascular;  Laterality: N/A;  . TRANSCATHETER AORTIC VALVE REPLACEMENT, TRANSFEMORAL N/A 11/08/2017   Procedure: TRANSCATHETER AORTIC VALVE REPLACEMENT, TRANSFEMORAL;  Surgeon: Sherren Mocha, MD;  Location: [place];  Service: Open Heart Surgery;  Laterality: N/A;  .  TRIGGER FINGER RELEASE Left 1996   thumb    Allergies  Allergen Reactions  . Morphine And Related Nausea And Vomiting and Other (See Comments)    Chest pain, also   . Lisinopril Cough  . Zoloft [Sertraline Hcl] Nausea Only         Current Outpatient Medications  Medication Sig Dispense Refill  . acetaminophen (TYLENOL) 500 MG tablet Take 500 mg by mouth daily as needed for moderate pain or headache.    Marland Kitchen amLODipine (NORVASC) 5 MG tablet Take 5 mg by mouth daily.    Marland Kitchen apixaban (ELIQUIS) 5 MG TABS tablet Take 1 tablet (5 mg total) by mouth 2 (two) times daily. 60 tablet 0  . atorvastatin (LIPITOR) 80 MG tablet Take 1 tablet (80 mg total) by mouth daily. 30 tablet 0  . furosemide (LASIX) 40 MG tablet Take 40 mg by mouth 3 (three) times daily.   0  . glucose 4 GM chewable tablet Chew 1-4 tablets by mouth once as needed  for low blood sugar.     . insulin NPH Human (HUMULIN N,NOVOLIN N) 100 UNIT/ML injection Inject 0.15 mLs (15 Units total) into the skin 2 (two) times daily. 15 units in the morning and 10 units in the PM (Patient taking differently: Inject 37 Units into the skin 2 (two) times daily. ) 10 mL 11  . insulin regular (NOVOLIN R,HUMULIN R) 100 units/mL injection Inject 5 Units into the skin 3 (three) times daily after meals.     Marland Kitchen losartan (COZAAR) 50 MG tablet Take 1 tablet (50 mg total) by mouth daily. 90 tablet 3  . metoprolol tartrate (LOPRESSOR) 25 MG tablet Take 0.5 tablets (12.5 mg total) by mouth 2 (two) times daily. 30 tablet 6  . Multiple Vitamin (MULTIVITAMIN WITH MINERALS) TABS tablet Take 1 tablet by mouth daily. One a Day 50 plus    . nitroGLYCERIN (NITROSTAT) 0.4 MG SL tablet Place 0.4 mg under the tongue every 5 (five) minutes as needed for chest pain.     No current facility-administered medications for this visit.     ROS: See HPI for pertinent positives and negatives.   Physical Examination  Vitals:   03/07/18 1548  BP: (!) 149/55  Pulse: 85  Resp: 20   Temp: 98.1 F (36.7 C)  TempSrc: Oral  SpO2: 97%  Weight: 189 lb (85.7 kg)  Height: 5\' 6"  (1.676 m)   Body mass index is 30.51 kg/m.  General: A&O x 3, WDWN, obese female. Gait: limp  HENT: No gross abnormalities.  Eyes: PERRLA. Pulmonary: Respirations are non labored, limited air movement in all fields, diffuse rales in lower half of bilateral posterior fields, no rhonchi or wheezes Cardiac: regular rhythm, no detected murmur.         Carotid Bruits Right Left   Negative Negative   Radial pulses are 2+ palpable bilaterally   Adominal aortic pulse is not palpable                         VASCULAR EXAM: Extremities without ischemic changes, without Gangrene; with open wound at lateral/plantar aspect of right foot, has signs of contracting, no purulence, is draining scant amount watery serosanguinous drainage.  See photo below.   Mild to moderate cellulitis of both lower legs with 1-2+ non pitting edema.                                                                                                                LE Pulses Right Left       FEMORAL   palpable   palpable        POPLITEAL  not palpable   not palpable       POSTERIOR TIBIAL  2+ palpable   2+ palpable        DORSALIS PEDIS      ANTERIOR TIBIAL not palpable  not palpable    Abdomen: soft, NT, no palpable masses. Skin: no rashes, no cellulitis, no ulcers noted. Musculoskeletal: no muscle wasting  or atrophy.  Neurologic: A&O X 3; appropriate affect, Sensation is normal; MOTOR FUNCTION:  moving all extremities equally, motor strength 5/5 throughout. Speech is slow, halting. CN 2-12 intact. Psychiatric: Thought content is normal, mood appropriate for clinical situation.     ASSESSMENT: Kristina Summers is a 82 y.o. female who is s/p right common iliac artery stent on 04/01/11; Left superficial femoral artery atherectomy and PTA on 06/17/11; Left superficial femoral artery PTA on 11/18/11 by Dr. Bridgett Larsson.  She is  dyspneic before she walks enough to claudicate.    [place] nurse from [facility] dresses her right foot ulcer twice/week, per pt. Pt states she is switching to [facility] since that group has transportation for her.  Her referral today is from Dr. Mila Palmer of [facility]. She sees a podiatrist, Jah N'Tuma with [facility], notes from podiatrist came with pt.  Continue podiatry and [facility] care of improving right foot ulcer. Her bilateral PT pulses are 2+ palpable, good arterial perfusion of her feet, confirmed by ABI's today. .   She has been dyspneic for 3 days, is worsening per pt. She has diffuse rales in the lower half of both posterior lung fields, states her dyspnea improved after the TAVR in July 2019, but digressed 3 days ago to pre TAVR dyspnea.  She was seen at [facility] ED in September for exacerbation of CHF.  I advised pt to notify her PCP today or tomorrow of her dyspnea, and if it worsens to call 911. Fortunately her SAO2 on room air is 97%.     Bilateral LE DVT duplex today is negative for DVT.  She has mild to moderate cellulitis in both lower legs with 1-2+ non pitting edema, defer to pt PCP whether to treat with antibiotics. There are no venous stasis ulcers in her lower legs.  I instructed pt how to elevate her legs to reduce the edema.  I advise that [facility] measure pt lower legs for knee high compression hose.   DATA  Bilateral LE DVT duplex today (03-07-18) is negative for DVT.   Left LE Arterial Duplex (03-06-18): Left Duplex Findings: +-----------+--------+-----+--------+--------+--------+       PSV cm/sRatioStenosisWaveformComments +-----------+--------+-----+--------+--------+--------+ CFA Distal 171          biphasic     +-----------+--------+-----+--------+--------+--------+ DFA    102          biphasic     +-----------+--------+-----+--------+--------+--------+ SFA Prox  265           biphasic     +-----------+--------+-----+--------+--------+--------+ SFA Mid  259          biphasic     +-----------+--------+-----+--------+--------+--------+ SFA Distal 200          biphasic     +-----------+--------+-----+--------+--------+--------+ POP Prox  161          biphasic     +-----------+--------+-----+--------+--------+--------+ POP Distal 97          biphasic     +-----------+--------+-----+--------+--------+--------+ ATA Distal 82          biphasic     +-----------+--------+-----+--------+--------+--------+ PTA Distal 76          biphasic     +-----------+--------+-----+--------+--------+--------+ PERO Distal47          biphasic     +-----------+--------+-----+--------+--------+--------+ Summary: Left: Diffuse calcific plaque throughout the left lower extremity. Velocities throughout the SFA are in the 50-74% range by velocity. Highest velocity is 265 cm/s at the proximal SFA. All biphasic waveforms.   ABI (Date:  03-06-18):  R:   ABI: 0.92 (was 0.81 on 07-04-17),   PT: mono  DP: bi  TBI:  0.53, toe pressure 86, (was 0.58)  L:   ABI: [place]ta (was ),   PT: bi  DP: bi  TBI: 0.78, toe pressure 126, (was 0.69) Right ABI has improved, mild disease with mono and biphasic waveforms.  Left ABI remains inaccurate, non compressible vessels (calcified), biphasic waveforms. Improvement in bilateral TBI.    PLAN:  Based on the patient's vascular studies and examination, pt will return to clinic in 6 months for ABI's.  I discussed in depth with the patient the nature of atherosclerosis, and emphasized the importance of maximal medical management including strict control of blood pressure, blood glucose, and lipid levels, obtaining regular exercise, and continued cessation of smoking.  The patient is aware that without  maximal medical management the underlying atherosclerotic disease process will progress, limiting the benefit of any interventions.  The patient was given information about PAD including signs, symptoms, treatment, what symptoms should prompt the patient to seek immediate medical care, and risk reduction measures to take.  Clemon Chambers, RN, MSN, FNP-C [facility] [employer] Phone: [phone]  Clinic MD: Bishop Dublin  03/07/18 3:55 PM

## 2018-03-13 ENCOUNTER — Encounter (HOSPITAL_COMMUNITY): Payer: Self-pay | Admitting: *Deleted

## 2018-03-13 ENCOUNTER — Other Ambulatory Visit: Payer: Self-pay

## 2018-03-13 ENCOUNTER — Inpatient Hospital Stay (HOSPITAL_COMMUNITY): Payer: Medicare Other

## 2018-03-13 ENCOUNTER — Inpatient Hospital Stay (HOSPITAL_COMMUNITY)
Admission: EM | Admit: 2018-03-13 | Discharge: 2018-03-18 | DRG: 291 | Disposition: A | Discharge status: Home / Self Care | Payer: Medicare Other | Attending: Internal Medicine | Admitting: Internal Medicine

## 2018-03-13 ENCOUNTER — Emergency Department (HOSPITAL_COMMUNITY): Payer: Medicare Other

## 2018-03-13 DIAGNOSIS — Z9842 Cataract extraction status, left eye: Secondary | ICD-10-CM

## 2018-03-13 DIAGNOSIS — I2510 Atherosclerotic heart disease of native coronary artery without angina pectoris: Secondary | ICD-10-CM | POA: Diagnosis present

## 2018-03-13 DIAGNOSIS — N179 Acute kidney failure, unspecified: Secondary | ICD-10-CM | POA: Diagnosis present

## 2018-03-13 DIAGNOSIS — I130 Hypertensive heart and chronic kidney disease with heart failure and stage 1 through stage 4 chronic kidney disease, or unspecified chronic kidney disease: Principal | ICD-10-CM | POA: Diagnosis present

## 2018-03-13 DIAGNOSIS — L03119 Cellulitis of unspecified part of limb: Secondary | ICD-10-CM | POA: Diagnosis present

## 2018-03-13 DIAGNOSIS — I509 Heart failure, unspecified: Secondary | ICD-10-CM

## 2018-03-13 DIAGNOSIS — Z888 Allergy status to other drugs, medicaments and biological substances status: Secondary | ICD-10-CM

## 2018-03-13 DIAGNOSIS — I480 Paroxysmal atrial fibrillation: Secondary | ICD-10-CM | POA: Diagnosis present

## 2018-03-13 DIAGNOSIS — D649 Anemia, unspecified: Secondary | ICD-10-CM | POA: Diagnosis not present

## 2018-03-13 DIAGNOSIS — Z885 Allergy status to narcotic agent status: Secondary | ICD-10-CM

## 2018-03-13 DIAGNOSIS — M19079 Primary osteoarthritis, unspecified ankle and foot: Secondary | ICD-10-CM | POA: Diagnosis present

## 2018-03-13 DIAGNOSIS — L97511 Non-pressure chronic ulcer of other part of right foot limited to breakdown of skin: Secondary | ICD-10-CM | POA: Diagnosis not present

## 2018-03-13 DIAGNOSIS — I252 Old myocardial infarction: Secondary | ICD-10-CM

## 2018-03-13 DIAGNOSIS — K922 Gastrointestinal hemorrhage, unspecified: Secondary | ICD-10-CM

## 2018-03-13 DIAGNOSIS — Z794 Long term (current) use of insulin: Secondary | ICD-10-CM

## 2018-03-13 DIAGNOSIS — I5033 Acute on chronic diastolic (congestive) heart failure: Secondary | ICD-10-CM | POA: Diagnosis present

## 2018-03-13 DIAGNOSIS — L97519 Non-pressure chronic ulcer of other part of right foot with unspecified severity: Secondary | ICD-10-CM | POA: Diagnosis present

## 2018-03-13 DIAGNOSIS — L97509 Non-pressure chronic ulcer of other part of unspecified foot with unspecified severity: Secondary | ICD-10-CM

## 2018-03-13 DIAGNOSIS — K921 Melena: Secondary | ICD-10-CM | POA: Diagnosis present

## 2018-03-13 DIAGNOSIS — E1151 Type 2 diabetes mellitus with diabetic peripheral angiopathy without gangrene: Secondary | ICD-10-CM | POA: Diagnosis not present

## 2018-03-13 DIAGNOSIS — I459 Conduction disorder, unspecified: Secondary | ICD-10-CM | POA: Diagnosis present

## 2018-03-13 DIAGNOSIS — Z952 Presence of prosthetic heart valve: Secondary | ICD-10-CM

## 2018-03-13 DIAGNOSIS — Z79899 Other long term (current) drug therapy: Secondary | ICD-10-CM

## 2018-03-13 DIAGNOSIS — E669 Obesity, unspecified: Secondary | ICD-10-CM | POA: Diagnosis present

## 2018-03-13 DIAGNOSIS — I6529 Occlusion and stenosis of unspecified carotid artery: Secondary | ICD-10-CM | POA: Diagnosis present

## 2018-03-13 DIAGNOSIS — D62 Acute posthemorrhagic anemia: Secondary | ICD-10-CM | POA: Diagnosis present

## 2018-03-13 DIAGNOSIS — Z66 Do not resuscitate: Secondary | ICD-10-CM | POA: Diagnosis present

## 2018-03-13 DIAGNOSIS — N183 Chronic kidney disease, stage 3 (moderate): Secondary | ICD-10-CM | POA: Diagnosis present

## 2018-03-13 DIAGNOSIS — E785 Hyperlipidemia, unspecified: Secondary | ICD-10-CM | POA: Diagnosis present

## 2018-03-13 DIAGNOSIS — K219 Gastro-esophageal reflux disease without esophagitis: Secondary | ICD-10-CM | POA: Diagnosis present

## 2018-03-13 DIAGNOSIS — E782 Mixed hyperlipidemia: Secondary | ICD-10-CM | POA: Diagnosis not present

## 2018-03-13 DIAGNOSIS — I5023 Acute on chronic systolic (congestive) heart failure: Secondary | ICD-10-CM | POA: Diagnosis present

## 2018-03-13 DIAGNOSIS — I10 Essential (primary) hypertension: Secondary | ICD-10-CM | POA: Diagnosis not present

## 2018-03-13 DIAGNOSIS — E1122 Type 2 diabetes mellitus with diabetic chronic kidney disease: Secondary | ICD-10-CM | POA: Diagnosis present

## 2018-03-13 DIAGNOSIS — Z9841 Cataract extraction status, right eye: Secondary | ICD-10-CM

## 2018-03-13 DIAGNOSIS — I482 Chronic atrial fibrillation, unspecified: Secondary | ICD-10-CM | POA: Diagnosis present

## 2018-03-13 DIAGNOSIS — M797 Fibromyalgia: Secondary | ICD-10-CM | POA: Diagnosis present

## 2018-03-13 DIAGNOSIS — D509 Iron deficiency anemia, unspecified: Secondary | ICD-10-CM

## 2018-03-13 DIAGNOSIS — Z6830 Body mass index (BMI) 30.0-30.9, adult: Secondary | ICD-10-CM

## 2018-03-13 DIAGNOSIS — N184 Chronic kidney disease, stage 4 (severe): Secondary | ICD-10-CM | POA: Diagnosis present

## 2018-03-13 DIAGNOSIS — Z951 Presence of aortocoronary bypass graft: Secondary | ICD-10-CM

## 2018-03-13 DIAGNOSIS — Z961 Presence of intraocular lens: Secondary | ICD-10-CM | POA: Diagnosis present

## 2018-03-13 DIAGNOSIS — E11621 Type 2 diabetes mellitus with foot ulcer: Secondary | ICD-10-CM | POA: Diagnosis present

## 2018-03-13 DIAGNOSIS — Z833 Family history of diabetes mellitus: Secondary | ICD-10-CM

## 2018-03-13 DIAGNOSIS — Z7901 Long term (current) use of anticoagulants: Secondary | ICD-10-CM

## 2018-03-13 DIAGNOSIS — Z8673 Personal history of transient ischemic attack (TIA), and cerebral infarction without residual deficits: Secondary | ICD-10-CM

## 2018-03-13 DIAGNOSIS — Z87891 Personal history of nicotine dependence: Secondary | ICD-10-CM

## 2018-03-13 DIAGNOSIS — Z953 Presence of xenogenic heart valve: Secondary | ICD-10-CM

## 2018-03-13 LAB — COMPREHENSIVE METABOLIC PANEL
ALBUMIN: 1.8 g/dL — AB (ref 3.5–5.0)
ALT: 29 U/L (ref 0–44)
ANION GAP: 7 (ref 5–15)
AST: 43 U/L — ABNORMAL HIGH (ref 15–41)
Alkaline Phosphatase: 103 U/L (ref 38–126)
BILIRUBIN TOTAL: 0.9 mg/dL (ref 0.3–1.2)
BUN: 40 mg/dL — ABNORMAL HIGH (ref 8–23)
CO2: 16 mmol/L — ABNORMAL LOW (ref 22–32)
Calcium: 8.2 mg/dL — ABNORMAL LOW (ref 8.9–10.3)
Chloride: 108 mmol/L (ref 98–111)
Creatinine, Ser: 1.69 mg/dL — ABNORMAL HIGH (ref 0.44–1.00)
GFR calc Af Amer: 32 mL/min — ABNORMAL LOW (ref >60)
GFR calc non Af Amer: 27 mL/min — ABNORMAL LOW (ref >60)
GLUCOSE: 138 mg/dL — AB (ref 70–99)
POTASSIUM: 3.6 mmol/L (ref 3.5–5.1)
SODIUM: 131 mmol/L — AB (ref 135–145)
TOTAL PROTEIN: 7.2 g/dL (ref 6.5–8.1)

## 2018-03-13 LAB — CBC WITH DIFFERENTIAL/PLATELET
Abs Immature Granulocytes: 0.06 10*3/uL (ref 0.00–0.07)
Abs Immature Granulocytes: 0.03 10*3/uL (ref 0.00–0.07)
BASOS ABS: 0.1 10*3/uL (ref 0.0–0.1)
BASOS PCT: 1 %
BASOS PCT: 1 %
Basophils Absolute: 0.1 10*3/uL (ref 0.0–0.1)
EOS ABS: 0.2 10*3/uL (ref 0.0–0.5)
EOS ABS: 0.3 10*3/uL (ref 0.0–0.5)
EOS PCT: 2 %
EOS PCT: 3 %
HCT: 22.9 % — ABNORMAL LOW (ref 36.0–46.0)
HCT: 23.4 % — ABNORMAL LOW (ref 36.0–46.0)
Hemoglobin: 6.7 g/dL — CL (ref 12.0–15.0)
Hemoglobin: 7.1 g/dL — ABNORMAL LOW (ref 12.0–15.0)
Immature Granulocytes: 1 %
Immature Granulocytes: 0 %
LYMPHS ABS: 0.6 10*3/uL — AB (ref 0.7–4.0)
LYMPHS PCT: 7 %
Lymphocytes Relative: 6 %
Lymphs Abs: 0.6 10*3/uL — ABNORMAL LOW (ref 0.7–4.0)
MCH: 25.8 pg — ABNORMAL LOW (ref 26.0–34.0)
MCH: 26.3 pg (ref 26.0–34.0)
MCHC: 29.3 g/dL — AB (ref 30.0–36.0)
MCHC: 30.3 g/dL (ref 30.0–36.0)
MCV: 88.1 fL (ref 80.0–100.0)
MCV: 86.7 fL (ref 80.0–100.0)
MONOS PCT: 9 %
Monocytes Absolute: 0.9 10*3/uL (ref 0.1–1.0)
Monocytes Absolute: 0.9 10*3/uL (ref 0.1–1.0)
Monocytes Relative: 9 %
NEUTROS ABS: 7.5 10*3/uL (ref 1.7–7.7)
NRBC: 0 % (ref 0.0–0.2)
Neutro Abs: 8.4 10*3/uL — ABNORMAL HIGH (ref 1.7–7.7)
Neutrophils Relative %: 80 %
Neutrophils Relative %: 81 %
PLATELETS: 253 10*3/uL (ref 150–400)
PLATELETS: 286 10*3/uL (ref 150–400)
RBC: 2.6 MIL/uL — ABNORMAL LOW (ref 3.87–5.11)
RBC: 2.7 MIL/uL — AB (ref 3.87–5.11)
RDW: 15.9 % — AB (ref 11.5–15.5)
RDW: 15.9 % — AB (ref 11.5–15.5)
WBC: 9.2 10*3/uL (ref 4.0–10.5)
WBC: 10.2 10*3/uL (ref 4.0–10.5)
nRBC: 0 % (ref 0.0–0.2)

## 2018-03-13 LAB — BRAIN NATRIURETIC PEPTIDE: B Natriuretic Peptide: 2601.1 pg/mL — ABNORMAL HIGH (ref 0.0–100.0)

## 2018-03-13 LAB — MRSA PCR SCREENING: MRSA by PCR: POSITIVE — AB

## 2018-03-13 LAB — TROPONIN I: Troponin I: 0.05 ng/mL (ref <0.03)

## 2018-03-13 LAB — GLUCOSE, CAPILLARY
GLUCOSE-CAPILLARY: 138 mg/dL — AB (ref 70–99)
GLUCOSE-CAPILLARY: 142 mg/dL — AB (ref 70–99)
Glucose-Capillary: 68 mg/dL — ABNORMAL LOW (ref 70–99)

## 2018-03-13 LAB — SEDIMENTATION RATE: SED RATE: 136 mm/h — AB (ref 0–22)

## 2018-03-13 LAB — PREPARE RBC (CROSSMATCH)

## 2018-03-13 LAB — POC OCCULT BLOOD, ED: FECAL OCCULT BLD: POSITIVE — AB

## 2018-03-13 LAB — PREALBUMIN: Prealbumin: <5 mg/dL — ABNORMAL LOW (ref 18–38)

## 2018-03-13 LAB — C-REACTIVE PROTEIN: CRP: 11.4 mg/dL — AB (ref <1.0)

## 2018-03-13 MED ORDER — INSULIN ASPART 100 UNIT/ML ~~LOC~~ SOLN
0.0000 [IU] | Freq: Three times a day (TID) | SUBCUTANEOUS | Status: DC
  Administered 2018-03-14: 2 [IU] via SUBCUTANEOUS
  Administered 2018-03-14: 3 [IU] via SUBCUTANEOUS
  Administered 2018-03-15: 15 [IU] via SUBCUTANEOUS
  Administered 2018-03-15 (×2): 5 [IU] via SUBCUTANEOUS
  Administered 2018-03-16: 3 [IU] via SUBCUTANEOUS
  Administered 2018-03-16: 2 [IU] via SUBCUTANEOUS
  Administered 2018-03-16: 8 [IU] via SUBCUTANEOUS
  Administered 2018-03-17 – 2018-03-18 (×4): 5 [IU] via SUBCUTANEOUS

## 2018-03-13 MED ORDER — FUROSEMIDE 10 MG/ML IJ SOLN
40.0000 mg | Freq: Two times a day (BID) | INTRAMUSCULAR | Status: DC
  Administered 2018-03-13 – 2018-03-15 (×4): 40 mg via INTRAVENOUS
  Filled 2018-03-13 (×5): qty 4

## 2018-03-13 MED ORDER — SODIUM CHLORIDE 0.9% IV SOLUTION
Freq: Once | INTRAVENOUS | Status: AC
  Administered 2018-03-13: 20:00:00 via INTRAVENOUS

## 2018-03-13 MED ORDER — ASPIRIN EC 81 MG PO TBEC
81.0000 mg | DELAYED_RELEASE_TABLET | Freq: Every day | ORAL | Status: DC
  Administered 2018-03-13 – 2018-03-14 (×2): 81 mg via ORAL
  Filled 2018-03-13 (×2): qty 1

## 2018-03-13 MED ORDER — ATORVASTATIN CALCIUM 80 MG PO TABS
80.0000 mg | ORAL_TABLET | Freq: Every day | ORAL | Status: DC
  Administered 2018-03-13 – 2018-03-17 (×5): 80 mg via ORAL
  Filled 2018-03-13 (×5): qty 1

## 2018-03-13 MED ORDER — HEPARIN (PORCINE) 25000 UT/250ML-% IV SOLN
1000.0000 [IU]/h | INTRAVENOUS | Status: DC
  Administered 2018-03-13 – 2018-03-16 (×4): 1000 [IU]/h via INTRAVENOUS
  Filled 2018-03-13 (×3): qty 250

## 2018-03-13 MED ORDER — ONDANSETRON HCL 4 MG/2ML IJ SOLN: 4.0000 mg | Freq: Four times a day (QID) | INTRAMUSCULAR | Status: DC | PRN

## 2018-03-13 MED ORDER — INSULIN NPH (HUMAN) (ISOPHANE) 100 UNIT/ML ~~LOC~~ SUSP
10.0000 [IU] | Freq: Every day | SUBCUTANEOUS | Status: DC
  Administered 2018-03-13 – 2018-03-17 (×5): 10 [IU] via SUBCUTANEOUS

## 2018-03-13 MED ORDER — SODIUM CHLORIDE 0.9 % IV SOLN: 250.0000 mL | INTRAVENOUS | Status: DC | PRN

## 2018-03-13 MED ORDER — CHLORHEXIDINE GLUCONATE CLOTH 2 % EX PADS
6.0000 | MEDICATED_PAD | Freq: Every day | CUTANEOUS | Status: DC
  Administered 2018-03-14 – 2018-03-18 (×3): 6 via TOPICAL

## 2018-03-13 MED ORDER — INSULIN NPH (HUMAN) (ISOPHANE) 100 UNIT/ML ~~LOC~~ SUSP
15.0000 [IU] | Freq: Every day | SUBCUTANEOUS | Status: DC
  Administered 2018-03-15 – 2018-03-18 (×4): 15 [IU] via SUBCUTANEOUS
  Filled 2018-03-13 (×2): qty 10

## 2018-03-13 MED ORDER — POLYETHYLENE GLYCOL 3350 17 G PO PACK
17.0000 g | PACK | Freq: Once | ORAL | Status: AC
  Administered 2018-03-13: 17 g via ORAL
  Filled 2018-03-13: qty 1

## 2018-03-13 MED ORDER — AMLODIPINE BESYLATE 2.5 MG PO TABS
5.0000 mg | ORAL_TABLET | Freq: Every day | ORAL | Status: DC
  Administered 2018-03-13 – 2018-03-18 (×6): 5 mg via ORAL
  Filled 2018-03-13: qty 1
  Filled 2018-03-13 (×6): qty 2

## 2018-03-13 MED ORDER — SODIUM CHLORIDE 0.9% FLUSH
3.0000 mL | Freq: Two times a day (BID) | INTRAVENOUS | Status: DC
  Administered 2018-03-13 – 2018-03-18 (×10): 3 mL via INTRAVENOUS

## 2018-03-13 MED ORDER — FUROSEMIDE 10 MG/ML IJ SOLN
20.0000 mg | Freq: Once | INTRAMUSCULAR | Status: AC
  Administered 2018-03-13: 20 mg via INTRAVENOUS

## 2018-03-13 MED ORDER — INSULIN ASPART 100 UNIT/ML ~~LOC~~ SOLN
0.0000 [IU] | Freq: Every day | SUBCUTANEOUS | Status: DC
  Administered 2018-03-14: 3 [IU] via SUBCUTANEOUS
  Administered 2018-03-15: 2 [IU] via SUBCUTANEOUS

## 2018-03-13 MED ORDER — METOPROLOL TARTRATE 25 MG PO TABS
25.0000 mg | ORAL_TABLET | Freq: Two times a day (BID) | ORAL | Status: DC
  Administered 2018-03-13 – 2018-03-18 (×10): 25 mg via ORAL
  Filled 2018-03-13 (×10): qty 1

## 2018-03-13 MED ORDER — ACETAMINOPHEN 325 MG PO TABS
650.0000 mg | ORAL_TABLET | ORAL | Status: DC | PRN
  Administered 2018-03-16: 650 mg via ORAL
  Filled 2018-03-13: qty 2

## 2018-03-13 MED ORDER — MUPIROCIN 2 % EX OINT
1.0000 "application " | TOPICAL_OINTMENT | Freq: Two times a day (BID) | CUTANEOUS | Status: AC
  Administered 2018-03-13 – 2018-03-18 (×10): 1 via NASAL
  Filled 2018-03-13: qty 22

## 2018-03-13 MED ORDER — SODIUM CHLORIDE 0.9% FLUSH: 3.0000 mL | INTRAVENOUS | Status: DC | PRN

## 2018-03-13 MED ORDER — PANTOPRAZOLE SODIUM 40 MG IV SOLR
40.0000 mg | Freq: Two times a day (BID) | INTRAVENOUS | Status: DC
  Administered 2018-03-13 – 2018-03-18 (×11): 40 mg via INTRAVENOUS
  Filled 2018-03-13 (×11): qty 40

## 2018-03-13 MED ORDER — FUROSEMIDE 10 MG/ML IJ SOLN
80.0000 mg | Freq: Once | INTRAMUSCULAR | Status: AC
  Administered 2018-03-13: 80 mg via INTRAVENOUS
  Filled 2018-03-13: qty 8

## 2018-03-13 MED ORDER — SENNA 8.6 MG PO TABS: 1.0000 | ORAL_TABLET | ORAL | Status: DC | PRN

## 2018-03-13 MED ORDER — LOSARTAN POTASSIUM 50 MG PO TABS
50.0000 mg | ORAL_TABLET | Freq: Every day | ORAL | Status: DC
  Administered 2018-03-14 – 2018-03-18 (×5): 50 mg via ORAL
  Filled 2018-03-13 (×5): qty 1

## 2018-03-13 MED ORDER — INSULIN NPH (HUMAN) (ISOPHANE) 100 UNIT/ML ~~LOC~~ SUSP: 10.0000 [IU] | SUBCUTANEOUS | Status: DC

## 2018-03-13 NOTE — Progress Notes (Signed)
ANTICOAGULATION CONSULT NOTE - Initial Consult  Pharmacy Consult for heparin Indication: atrial fibrillation s/p TAVR   Allergies  Allergen Reactions  . Morphine And Related Nausea And Vomiting and Other (See Comments)    Chest pain, also   . Lisinopril Cough  . Zoloft [Sertraline Hcl] Nausea Only         Patient Measurements: Height: 5\' 6"  (167.6 cm) Weight: 190 lb (86.2 kg) IBW/kg (Calculated) : 59.3 Heparin Dosing Weight: 77 kg  Vital Signs: BP: 160/66 (11/25 1500) Pulse Rate: 84 (11/25 1500)  Labs: Recent Labs    03/13/18 1114  HGB 6.7*  HCT 22.9*  PLT 253  CREATININE 1.69*  TROPONINI 0.05*    Estimated Creatinine Clearance: 29.4 mL/min (A) (by C-G formula based on SCr of 1.69 mg/dL (H)).   Medical History: Past Medical History:  Diagnosis Date  . Anemia   . CAD (coronary artery disease)    a. s/p CABG in 2000  . Carotid artery occlusion   . CKD (chronic kidney disease)   . Diastolic CHF ([place]) 27/0350  . Fibromyalgia   . GERD (gastroesophageal reflux disease)   . History of CVA (cerebrovascular accident) 05/24/2014  . History of GI bleed   . Hyperlipidemia   . Hypertension   . Obesity   . PAF (paroxysmal atrial fibrillation) (HCC)    a. on Eliquis  . Peripheral vascular disease ([place])   . 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 Type II diabetes mellitus ([place]) dx'd 1979    Medications:   (Not in a hospital admission)  Assessment: 5 YOF with h/o on Afib on Eliquis at home presents with SOB x 1 week. Of note, she has noticed darker stools lately. Hgb 6.7 on presentation. Plt wnl. Pharmacy consulted to start IV heparin. DIscussed case with attending MD who agreed that while patient may be bleeding, this seems more chronic and given her risk of thrombosis in setting of TAVR and AFib, benefits of low dose IV heparin may  outweight risks for now. She will be transfused for now.   Her last dose of apixaban was yesterday.   Goal of Therapy:  Heparin level 0.3-0.5 units/ml Monitor platelets by anticoagulation protocol: Yes   Plan:  -Start IV heparin at 1000 units/hr with no bolus -F/u 8 hr heparin level.  -Monitor daily HL and CBC -Monitor very closely for s/s of overt worsening bleeding   Albertina Parr, PharmD., BCPS Clinical Pharmacist Clinical phone for 03/13/18 until 3:30pm: [phone] If after 3:30pm, please refer to [facility] for unit-specific pharmacist

## 2018-03-13 NOTE — ED Notes (Signed)
Port XR at bedside.

## 2018-03-13 NOTE — Consult Note (Signed)
Referring Provider:  Dr. Karmen Bongo ([facility]) Primary Care Physician:  [facility] Primary Gastroenterologist:  Dr. Cristina Gong  Reason for Consultation:  Progressive anemia, Heme positive stool  HPI: Kristina Summers is a 82 y.o.[place] female with a transcatheter aortic valve replacement performed this past July, and with chronic anticoagulation with Eliquis because of atrial fibrillation, being admitted through the Emergency room because of shortness of breath of approximately 1 weeks duration, associated with a drop in hemoglobin.  In the emergency room, her stool was brown and Hemoccult positive.  She was in the hospital 2 months ago with melena while on Eliquis, and underwent upper endoscopy by Dr. Acquanetta Sit on 12/25/2017 which was negative.  Of note, the patient typically runs a hemoglobin in the 10 range, and was maintaining that level until this past September when it dropped to 6.7, prompting the above-mentioned endoscopy.  At hospital discharge on September 9 it was 8.6 and it stayed at that range as recently as October 22, when it was 8.3, but when checked several weeks later on November 12, it had fallen to 7.3 and today it is 6.7.  The patient's BUN of 40 today is higher than typical for her, but her creatinine of 1.69 is also substantially higher than baseline so it appears that the BUN is consistent with her azotemia rather than being indicative of an upper GI bleed.  The patient indicates that she lives independently in an apartment and until about a week ago was able to walk a block without stopping; for the past week, she has been short of breath with mild exertion.  She denies exposure to aspirin or nonsteroidal anti-inflammatory drugs.    She does not have any localizing GI tract symptoms such as dysphagia, frank anorexia, heartburn, or abdominal pain although she does think she has gradually lost some weight and she does tend to have several bowel movements each  morning.  I do not think there has been any frankly melenic stools since her previous hospitalization.  Of interest, the patient had a GI bleed in January, 2011 while over anticoagulated with Coumadin, at which time endoscopic evaluation was negative.  Colonoscopy by me in 2011 was negative except for sigmoid diverticulosis.  Past Medical History:  Diagnosis Date  . Anemia   . CAD (coronary artery disease)    a. s/p CABG in 2000  . Carotid artery occlusion   . CKD (chronic kidney disease)   . Diastolic CHF ([place]) 58/5277  . Fibromyalgia   . GERD (gastroesophageal reflux disease)   . History of CVA (cerebrovascular accident) 05/24/2014  . History of GI bleed   . Hyperlipidemia   . Hypertension   . Obesity   . PAF (paroxysmal atrial fibrillation) (HCC)    a. on Eliquis  . Peripheral vascular disease ([place])   . 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 Type II diabetes mellitus ([place]) dx'd 1979    Past Surgical History:  Procedure Laterality Date  . ABDOMINAL AORTAGRAM N/A 04/01/2011   Procedure: ABDOMINAL AORTAGRAM;  Surgeon: Conrad Etowah, MD;  Location: [facility] CATH LAB;  Service: Cardiovascular;  Laterality: N/A;  . ANGIOPLASTY  06/17/11   Left leg common femoral artery cannulation under u/s Left leg runoff  . CARDIAC CATHETERIZATION  10/11/2017  . CARPAL TUNNEL RELEASE Right   . CATARACT EXTRACTION W/ INTRAOCULAR LENS  IMPLANT, BILATERAL  2004-2005  .  CORONARY ARTERY BYPASS GRAFT  2000   CABG X5  . ESOPHAGOGASTRODUODENOSCOPY (EGD) WITH PROPOFOL N/A 12/25/2017   Procedure: ESOPHAGOGASTRODUODENOSCOPY (EGD) WITH PROPOFOL;  Surgeon: Wonda Horner, MD;  Location[facility] ENDOSCOPY;  Service: Endoscopy;  Laterality: N/A;  . EYE SURGERY Left    "lasered before cataract OR"  . LOWER EXTREMITY ANGIOGRAM Bilateral 04/01/2011   Procedure: LOWER EXTREMITY ANGIOGRAM;  Surgeon:  Conrad [place], MD;  Location: [facility] CATH LAB;  Service: Cardiovascular;  Laterality: Bilateral;  . LOWER EXTREMITY ANGIOGRAM Left 06/17/2011   Procedure: LOWER EXTREMITY ANGIOGRAM;  Surgeon: Conrad Giltner, MD;  Location: [facility] CATH LAB;  Service: Cardiovascular;  Laterality: Left;  . LOWER EXTREMITY ANGIOGRAM N/A 11/18/2011   Procedure: LOWER EXTREMITY ANGIOGRAM;  Surgeon: Conrad Geiger, MD;  Location: [facility] CATH LAB;  Service: Cardiovascular;  Laterality: N/A;  . PERCUTANEOUS STENT INTERVENTION Right 04/01/2011   Procedure: PERCUTANEOUS STENT INTERVENTION;  Surgeon: Conrad Trinway, MD;  Location: [facility] CATH LAB;  Service: Cardiovascular;  Laterality: Right;  rt iliac stent  . PERIPHERAL ARTERIAL STENT GRAFT  04/01/11   right common iliac  . RIGHT/LEFT HEART CATH AND CORONARY/GRAFT ANGIOGRAPHY N/A 10/11/2017   Procedure: RIGHT/LEFT HEART CATH AND CORONARY/GRAFT ANGIOGRAPHY;  Surgeon: Sherren Mocha, MD;  Location: [place] CV LAB;  Service: Cardiovascular;  Laterality: N/A;  . TEE WITHOUT CARDIOVERSION N/A 11/08/2017   Procedure: TRANSESOPHAGEAL ECHOCARDIOGRAM (TEE);  Surgeon: Sherren Mocha, MD;  Location: [place];  Service: Open Heart Surgery;  Laterality: N/A;  . TEE WITHOUT CARDIOVERSION N/A 12/15/2017   Procedure: TRANSESOPHAGEAL ECHOCARDIOGRAM (TEE);  Surgeon: Larey Dresser, MD;  Location: [facility] ENDOSCOPY;  Service: Cardiovascular;  Laterality: N/A;  . TRANSCATHETER AORTIC VALVE REPLACEMENT, TRANSFEMORAL N/A 11/08/2017   Procedure: TRANSCATHETER AORTIC VALVE REPLACEMENT, TRANSFEMORAL;  Surgeon: Sherren Mocha, MD;  Location: [place];  Service: Open Heart Surgery;  Laterality: N/A;  . TRIGGER FINGER RELEASE Left 1996   thumb    Prior to Admission medications   Medication Sig Start Date End Date Taking? Authorizing Provider  acetaminophen (TYLENOL) 500 MG tablet Take 500 mg by mouth daily as needed for moderate pain or headache.   Yes [provider]  amLODipine (NORVASC) 10 MG tablet Take 10 mg by  mouth daily.    Yes [provider]  apixaban (ELIQUIS) 5 MG TABS tablet Take 1 tablet (5 mg total) by mouth 2 (two) times daily. 12/26/17  Yes Shelly Coss, MD  atorvastatin (LIPITOR) 80 MG tablet Take 1 tablet (80 mg total) by mouth daily. Patient taking differently: Take 80 mg by mouth daily at 6 PM.  11/27/12  Yes Baker, Freeman Caldron, PA-C  furosemide (LASIX) 40 MG tablet Take 40 mg by mouth 3 (three) times daily.  06/30/17  Yes [provider]  glucose 4 GM chewable tablet Chew 1-4 tablets by mouth once as needed for low blood sugar.    Yes [provider]  insulin NPH Human (HUMULIN N,NOVOLIN N) 100 UNIT/ML injection Inject 0.15 mLs (15 Units total) into the skin 2 (two) times daily. 15 units in the morning and 10 units in the PM Patient taking differently: Inject 10-15 Units into the skin See admin instructions. Take 15 units in the morning and 10 units in the evening 10/12/17  Yes Eileen Stanford, PA-C  insulin regular (NOVOLIN R,HUMULIN R) 100 units/mL injection Inject 5 Units into the skin 2 (two) times daily before a meal.    Yes [provider]  losartan (COZAAR) 50 MG tablet  Take 1 tablet (50 mg total) by mouth daily. 11/22/17  Yes Eileen Stanford, PA-C  metoprolol tartrate (LOPRESSOR) 25 MG tablet Take 0.5 tablets (12.5 mg total) by mouth 2 (two) times daily. Patient taking differently: Take 25 mg by mouth 2 (two) times daily.  11/10/17  Yes Eileen Stanford, PA-C  Multiple Vitamin (MULTIVITAMIN WITH MINERALS) TABS tablet Take 1 tablet by mouth daily. One a Day 50 plus   Yes [provider]  nitroGLYCERIN (NITROSTAT) 0.4 MG SL tablet Place 0.4 mg under the tongue every 5 (five) minutes as needed for chest pain.   Yes [provider]  senna (SENOKOT) 8.6 MG TABS tablet Take 1 tablet by mouth as needed for mild constipation.   Yes [provider]    Current Facility-Administered Medications  Medication Dose Route  Frequency Provider Last Rate Last Dose  . 0.9 %  sodium chloride infusion (Manually program via Guardrails IV Fluids)   Intravenous Once Karmen Bongo, MD      . 0.9 %  sodium chloride infusion  250 mL Intravenous PRN Karmen Bongo, MD      . acetaminophen (TYLENOL) tablet 650 mg  650 mg Oral Q4H PRN Karmen Bongo, MD      . amLODipine (NORVASC) tablet 5 mg  5 mg Oral Daily Karmen Bongo, MD   5 mg at 03/13/18 1528  . aspirin EC tablet 81 mg  81 mg Oral Daily Karmen Bongo, MD   81 mg at 03/13/18 1528  . atorvastatin (LIPITOR) tablet 80 mg  80 mg Oral q1800 Karmen Bongo, MD      . furosemide (LASIX) injection 20 mg  20 mg Intravenous Once Karmen Bongo, MD      . furosemide (LASIX) injection 40 mg  40 mg Intravenous Lillia Mountain, MD      . heparin ADULT infusion 100 units/mL (25000 units/247mL sodium chloride 0.45%)  1,000 Units/hr Intravenous Continuous Mancheril, Darnell Level, RPH      . insulin aspart (novoLOG) injection 0-15 Units  0-15 Units Subcutaneous TID WC Karmen Bongo, MD      . insulin aspart (novoLOG) injection 0-5 Units  0-5 Units Subcutaneous QHS Karmen Bongo, MD      . insulin NPH Human (HUMULIN N,NOVOLIN N) injection 10-15 Units  10-15 Units Subcutaneous See admin instructions Karmen Bongo, MD      . Derrill Memo ON 03/14/2018] losartan (COZAAR) tablet 50 mg  50 mg Oral Daily Karmen Bongo, MD      . metoprolol tartrate (LOPRESSOR) tablet 25 mg  25 mg Oral BID Karmen Bongo, MD      . ondansetron [facility]) injection 4 mg  4 mg Intravenous Q6H PRN Karmen Bongo, MD      . pantoprazole (PROTONIX) injection 40 mg  40 mg Intravenous Lillia Mountain, MD   40 mg at 03/13/18 1530  . senna (SENOKOT) tablet 8.6 mg  1 tablet Oral PRN Karmen Bongo, MD      . sodium chloride flush (NS) 0.9 % injection 3 mL  3 mL Intravenous Q12H Karmen Bongo, MD   3 mL at 03/13/18 1530  . sodium chloride flush (NS) 0.9 % injection 3 mL  3 mL Intravenous PRN Karmen Bongo, MD        Allergies as of 03/13/2018 - Review Complete 03/13/2018  Allergen Reaction Noted  . Morphine and related Nausea And Vomiting and Other (See Comments) 11/18/2011  . Lisinopril Cough 03/04/2011  . Zoloft [sertraline hcl] Nausea Only 11/27/2012  Family History  Problem Relation Age of Onset  . Other Brother        intestinal blockage  . Diabetes Brother     Social History   Socioeconomic History  . Marital status: Divorced    Spouse name: Not on file  . Number of children: 3  . Years of education: 35  . Highest education level: Not on file  Occupational History  . Occupation: Retired  Scientific laboratory technician  . Financial resource strain: Not on file  . Food insecurity:    Worry: Not on file    Inability: Not on file  . Transportation needs:    Medical: Not on file    Non-medical: Not on file  Tobacco Use  . Smoking status: Former Smoker    Packs/day: 2.00    Years: 30.00    Pack years: 60.00    Types: Cigarettes    Last attempt to quit: 04/19/1990    Years since quitting: 27.9  . Smokeless tobacco: Never Used  . Tobacco comment: stopped smoking cigarettes 1991  Substance and Sexual Activity  . Alcohol use: Not Currently    Alcohol/week: 0.0 standard drinks    Comment: "tried different alcohols when I was 1st married; never drank much at  ALL"  . Drug use: Never  . Sexual activity: Not Currently  Lifestyle  . Physical activity:    Days per week: Not on file    Minutes per session: Not on file  . Stress: Not on file  Relationships  . Social connections:    Talks on phone: Not on file    Gets together: Not on file    Attends religious service: Not on file    Active member of club or organization: Not on file    Attends meetings of clubs or organizations: Not on file    Relationship status: Not on file  . Intimate partner violence:    Fear of current or ex partner: Not on file    Emotionally abused: Not on file    Physically abused: Not on file     Forced sexual activity: Not on file  Other Topics Concern  . Not on file  Social History Narrative   Divorced.  Native of [place].  Formerly worked as Education administrator person. 01/13/17 lives alone   Caffeine use: drinks decaf tea and coffee    Review of Systems: See HPI  Physical Exam: Vital signs in last 24 hours: Pulse Rate:  [82-93] 82 (11/25 1630) Resp:  [20-27] 24 (11/25 1630) BP: (139-166)/(56-89) 139/56 (11/25 1630) SpO2:  [98 %-100 %] 100 % (11/25 1630) Weight:  [86.2 kg] 86.2 kg (11/25 1113)   This is a very pleasant, sweet elderly female with a [place] brogue, moderately overweight, very pale, minimally dyspneic, no acute distress.  Chest clear, heart without murmur or arrhythmia, abdomen without mass or tenderness.  Conjunctivae are very pale, no scleral icterus.  No evident focal neurologic deficits, but the patient's speech is mildly impaired, with sort of an expressive dysphasia.  No peripheral edema, skin rashes, or overt deformities.  Intake/Output from previous day: No intake/output data recorded. Intake/Output this shift: Total I/O In: 3 [I.V.:3] Out: 500 [Urine:500]  Lab Results: Recent Labs    03/13/18 1114  WBC 9.2  HGB 6.7*  HCT 22.9*  PLT 253   BMET Recent Labs    03/13/18 1114  NA 131*  K 3.6  CL 108  CO2 16*  GLUCOSE 138*  BUN 40*  CREATININE 1.69*  CALCIUM 8.2*   LFT Recent Labs    03/13/18 1114  PROT 7.2  ALBUMIN 1.8*  AST 43*  ALT 29  ALKPHOS 103  BILITOT 0.9   PT/INR No results for input(s): LABPROT, INR in the last 72 hours.  Studies/Results: Dg Chest 2 View  Result Date: 03/13/2018 CLINICAL DATA:  Shortness of breath EXAM: CHEST - 2 VIEW COMPARISON:  01/16/2018 FINDINGS: Cardiac shadow remains enlarged. Postsurgical changes are again seen and stable. Right-sided PICC line has been removed in the interval. Aortic calcifications are again noted. The lungs are hyperinflated with chronic interstitial changes bilaterally stable  from the previous exam. Some central vascular congestion and mild interstitial edema is noted. No large pleural effusion is seen. IMPRESSION: Changes of mild CHF.  No focal infiltrate is seen. Electronically Signed   By: Inez Catalina M.D.   On: 03/13/2018 12:01   Dg Foot 2 Views Right  Result Date: 03/13/2018 CLINICAL DATA:  Foot ulcer. EXAM: RIGHT FOOT - 2 VIEW COMPARISON:  None. FINDINGS: There is no evidence of fracture or dislocation. No radiographic evidence of osteomyelitis. Osteoarthritic changes at the first metatarsophalangeal joint. Ventral soft tissue swelling at the level of the metatarsophalangeal joints. Two linear metallic density foreign bodies within the ventral soft tissues adjacent to the first metatarsophalangeal joint. IMPRESSION: No evidence of osseous abnormalities to suggest osteomyelitis. Two linear metallic density foreign bodies within the ventral soft tissues adjacent to the first metatarsophalangeal joint. Electronically Signed   By: Fidela Salisbury M.D.   On: 03/13/2018 15:37    Impression: 1.  Progressive subacute-on-chronic anemia, probably multifactorial (chronic low-grade GI blood loss, chronic kidney disease) 2.  Dyspnea related to severe anemia, superimposed on other factors including atrial fibrillation, obesity, possibly some element of deconditioning.  Discussion: Since the patient has had significant drop in hemoglobin since she had a negative endoscopy about 2 months ago, further GI tract evaluation is in order.   Since there has been no visible rectal bleeding, it would seem that it would be appropriate to start with a capsule endoscopy of the small bowel as our next step.  Although it is unlikely that a treatable source of blood loss will be identified, the findings at that test might help [place] to know whether or not it is prudent to maintain this patient on anticoagulation.  If the capsule endoscopy is unrevealing, it would probably be appropriate to proceed  to updated colonoscopy (last done 8 years ago) although that exam would probably best be done as an outpatient after the patient has had a chance to get stronger.  Plan: 1.  Agree with plan for transfusion 2.  Agree with empiric PPI therapy, especially since aspirin has been started 3.  Small bowel capsule endoscopy tomorrow 4.  If capsule endoscopy is negative, probable eventual colonoscopy as an outpatient to complete her GI work-up.   LOS: 0 days   Kristina Summers  03/13/2018, 5:09 PM   Pager [phone] If no answer or after 5 PM call [phone]

## 2018-03-13 NOTE — H&P (Signed)
History and Physical    Kristina Summers IRC:[id number] DOB: 05-Jan-1936 DOA: 03/13/2018  PCP: [facility], [place] Consultants:  Agustin Cree - cardiology; Arbutus Leas - endocrinology; Megan Salon - ID; Buccini - GI; Bridgett Larsson - vascular; Asenso - ENT Patient coming from: Home - lives alone; NOKMechele Claude, daughter, [phone]  Chief Complaint: SOB  HPI: Kristina Summers is a 82 y.o. [place] female with medical history significant of DM; PVD; afib on Elquis; s/p TAVR 11/08/17; HTN; HLD; CVA; diastolic CHF; CAD s/p CABG; and CKD presenting with SOB.  She was admitted from 8/25-30 for MRSA bacteremia, on IV vanc x 6 weeks.  She returned from 9/5-9 for ABLA s/p 3 units PRBC with negative EGD.  She reports having SOB x 1 week.  Slight nonproductive cough.  SOB is mainly exertional.  She doesn't feel normal even while sitting.  +orthopnea, PND.  She has had difficulty sleeping this past week.  No CP.  +LE ankles, more chronic.  +diabetic foot ulcer on R foot.  No weight changes.  She can't see well and so does not know if she has had blood in her stools, but she has noticed it was darker this past month than usual.   ED Course:  SOB, worse x 1 week.  No infectious symptoms. CHF exacerbation, but complicated by Hgb 6.7 and heme positive with brown stools.  Dr. Cristina Gong [facility]) will consult.  On Eliquis. Given Lasix.  Chronically elevated troponin, stable.   Review of Systems: As per HPI; otherwise review of systems reviewed and negative.   Ambulatory Status:  Ambulates with a rollator  Past Medical History:  Diagnosis Date  . Anemia   . CAD (coronary artery disease)    a. s/p CABG in 2000  . Carotid artery occlusion   . CKD (chronic kidney disease)   . Diastolic CHF ([place]) 82/4235  . Fibromyalgia   . GERD (gastroesophageal reflux disease)   . History of CVA (cerebrovascular accident) 05/24/2014  . History of GI bleed   . Hyperlipidemia   . Hypertension   . Obesity   . PAF (paroxysmal  atrial fibrillation) (HCC)    a. on Eliquis  . Peripheral vascular disease ([place])   . 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 Type II diabetes mellitus ([place]) dx'd 1979    Past Surgical History:  Procedure Laterality Date  . ABDOMINAL AORTAGRAM N/A 04/01/2011   Procedure: ABDOMINAL AORTAGRAM;  Surgeon: Conrad Midway, MD;  Location[facility] CATH LAB;  Service: Cardiovascular;  Laterality: N/A;  . ANGIOPLASTY  06/17/11   Left leg common femoral artery cannulation under u/s Left leg runoff  . CARDIAC CATHETERIZATION  10/11/2017  . CARPAL TUNNEL RELEASE Right   . CATARACT EXTRACTION W/ INTRAOCULAR LENS  IMPLANT, BILATERAL  2004-2005  . CORONARY ARTERY BYPASS GRAFT  2000   CABG X5  . ESOPHAGOGASTRODUODENOSCOPY (EGD) WITH PROPOFOL N/A 12/25/2017   Procedure: ESOPHAGOGASTRODUODENOSCOPY (EGD) WITH PROPOFOL;  Surgeon: Wonda Horner, MD;  Location: [facility] ENDOSCOPY;  Service: Endoscopy;  Laterality: N/A;  . EYE SURGERY Left    "lasered before cataract OR"  . LOWER EXTREMITY ANGIOGRAM Bilateral 04/01/2011   Procedure: LOWER EXTREMITY ANGIOGRAM;  Surgeon: Conrad Pewamo, MD;  Location: [facility] CATH LAB;  Service: Cardiovascular;  Laterality: Bilateral;  . LOWER EXTREMITY ANGIOGRAM Left 06/17/2011   Procedure: LOWER EXTREMITY ANGIOGRAM;  Surgeon: Conrad Nazlini, MD;  Location: [place] CATH LAB;  Service: Cardiovascular;  Laterality: Left;  . LOWER EXTREMITY ANGIOGRAM N/A 11/18/2011   Procedure: LOWER EXTREMITY ANGIOGRAM;  Surgeon: Conrad Montevideo, MD;  Location: [facility] CATH LAB;  Service: Cardiovascular;  Laterality: N/A;  . PERCUTANEOUS STENT INTERVENTION Right 04/01/2011   Procedure: PERCUTANEOUS STENT INTERVENTION;  Surgeon: Conrad Henryetta, MD;  Location: [facility] CATH LAB;  Service: Cardiovascular;  Laterality: Right;  rt iliac stent  . PERIPHERAL ARTERIAL STENT GRAFT  04/01/11   right common iliac  . RIGHT/LEFT  HEART CATH AND CORONARY/GRAFT ANGIOGRAPHY N/A 10/11/2017   Procedure: RIGHT/LEFT HEART CATH AND CORONARY/GRAFT ANGIOGRAPHY;  Surgeon: Sherren Mocha, MD;  Location: [place] CV LAB;  Service: Cardiovascular;  Laterality: N/A;  . TEE WITHOUT CARDIOVERSION N/A 11/08/2017   Procedure: TRANSESOPHAGEAL ECHOCARDIOGRAM (TEE);  Surgeon: Sherren Mocha, MD;  Location: [place];  Service: Open Heart Surgery;  Laterality: N/A;  . TEE WITHOUT CARDIOVERSION N/A 12/15/2017   Procedure: TRANSESOPHAGEAL ECHOCARDIOGRAM (TEE);  Surgeon: Larey Dresser, MD;  Location: [facility] ENDOSCOPY;  Service: Cardiovascular;  Laterality: N/A;  . TRANSCATHETER AORTIC VALVE REPLACEMENT, TRANSFEMORAL N/A 11/08/2017   Procedure: TRANSCATHETER AORTIC VALVE REPLACEMENT, TRANSFEMORAL;  Surgeon: Sherren Mocha, MD;  Location: [place];  Service: Open Heart Surgery;  Laterality: N/A;  . TRIGGER FINGER RELEASE Left 1996   thumb    Social History   Socioeconomic History  . Marital status: Divorced    Spouse name: Not on file  . Number of children: 3  . Years of education: 62  . Highest education level: Not on file  Occupational History  . Occupation: Retired  Scientific laboratory technician  . Financial resource strain: Not on file  . Food insecurity:    Worry: Not on file    Inability: Not on file  . Transportation needs:    Medical: Not on file    Non-medical: Not on file  Tobacco Use  . Smoking status: Former Smoker    Packs/day: 2.00    Years: 30.00    Pack years: 60.00    Types: Cigarettes    Last attempt to quit: 04/19/1990    Years since quitting: 27.9  . Smokeless tobacco: Never Used  . Tobacco comment: stopped smoking cigarettes 1991  Substance and Sexual Activity  . Alcohol use: Not Currently    Alcohol/week: 0.0 standard drinks    Comment: "tried different alcohols when I was 1st married; never drank much at  ALL"  . Drug use: Never  . Sexual activity: Not Currently  Lifestyle  . Physical activity:    Days per week: Not on file     Minutes per session: Not on file  . Stress: Not on file  Relationships  . Social connections:    Talks on phone: Not on file    Gets together: Not on file    Attends religious service: Not on file    Active member of club or organization: Not on file    Attends meetings of clubs or organizations: Not on file    Relationship status: Not on file  . Intimate partner violence:    Fear of current or ex partner: Not on file    Emotionally abused: Not on file    Physically abused: Not on file    Forced sexual activity: Not on file  Other Topics Concern  . Not on file  Social History Narrative   Divorced.  Native of [place].  Formerly worked as Education administrator person. 01/13/17 lives alone   Caffeine use: drinks  decaf tea and coffee    Allergies  Allergen Reactions  . Morphine And Related Nausea And Vomiting and Other (See Comments)    Chest pain, also   . Lisinopril Cough  . Zoloft [Sertraline Hcl] Nausea Only         Family History  Problem Relation Age of Onset  . Other Brother        intestinal blockage  . Diabetes Brother     Prior to Admission medications   Medication Sig Start Date End Date Taking? Authorizing Provider  acetaminophen (TYLENOL) 500 MG tablet Take 500 mg by mouth daily as needed for moderate pain or headache.   Yes [provider]  amLODipine (NORVASC) 10 MG tablet Take 10 mg by mouth daily.    Yes [provider]  apixaban (ELIQUIS) 5 MG TABS tablet Take 1 tablet (5 mg total) by mouth 2 (two) times daily. 12/26/17  Yes Shelly Coss, MD  atorvastatin (LIPITOR) 80 MG tablet Take 1 tablet (80 mg total) by mouth daily. Patient taking differently: Take 80 mg by mouth daily at 6 PM.  11/27/12  Yes Baker, Freeman Caldron, PA-C  furosemide (LASIX) 40 MG tablet Take 40 mg by mouth 3 (three) times daily.  06/30/17  Yes [provider]  glucose 4 GM chewable tablet Chew 1-4 tablets by mouth once as needed for low blood sugar.    Yes [provider]  insulin NPH Human (HUMULIN N,NOVOLIN N) 100 UNIT/ML injection Inject 0.15 mLs (15 Units total) into the skin 2 (two) times daily. 15 units in the morning and 10 units in the PM Patient taking differently: Inject 10-15 Units into the skin See admin instructions. Take 15 units in the morning and 10 units in the evening 10/12/17  Yes Eileen Stanford, PA-C  insulin regular (NOVOLIN R,HUMULIN R) 100 units/mL injection Inject 5 Units into the skin 2 (two) times daily before a meal.    Yes [provider]  losartan (COZAAR) 50 MG tablet Take 1 tablet (50 mg total) by mouth daily. 11/22/17  Yes Eileen Stanford, PA-C  metoprolol tartrate (LOPRESSOR) 25 MG tablet Take 0.5 tablets (12.5 mg total) by mouth 2 (two) times daily. Patient taking differently: Take 25 mg by mouth 2 (two) times daily.  11/10/17  Yes Eileen Stanford, PA-C  Multiple Vitamin (MULTIVITAMIN WITH MINERALS) TABS tablet Take 1 tablet by mouth daily. One a Day 50 plus   Yes [provider]  nitroGLYCERIN (NITROSTAT) 0.4 MG SL tablet Place 0.4 mg under the tongue every 5 (five) minutes as needed for chest pain.   Yes [provider]  senna (SENOKOT) 8.6 MG TABS tablet Take 1 tablet by mouth as needed for mild constipation.   Yes [provider]    Physical Exam: Vitals:   03/13/18 1330 03/13/18 1400 03/13/18 1430 03/13/18 1500  BP: (!) 165/66 (!) 166/68 (!) 156/89 (!) 160/66  Pulse: 88 87 84 84  Resp: (!) 23 (!) 22 (!) 25 20  SpO2: 99% 98% 99% 99%  Weight:      Height:         General:  Appears calm and comfortable and is NAD Eyes:  PERRL, EOMI, normal lids, iris ENT:  grossly normal hearing, lips & tongue, mmm Neck:  no LAD, masses or thyromegaly Cardiovascular:  Irregularly irregular, rate controlled, no m/r/g. No LE edema.  Respiratory:   Mild bibasilar crackles.  Normal to mildly increased respiratory effort. Abdomen:  soft, NT,  ND, NABS Back:   normal alignment, no  CVAT Skin:  no rash or induration seen on limited exam Musculoskeletal:  grossly normal tone BUE/BLE, good ROM, no bony abnormality Psychiatric:  grossly normal mood and affect, speech fluent and appropriate, AOx3 Neurologic:  CN 2-12 grossly intact, moves all extremities in coordinated fashion, sensation intact    Radiological Exams on Admission: Dg Chest 2 View  Result Date: 03/13/2018 CLINICAL DATA:  Shortness of breath EXAM: CHEST - 2 VIEW COMPARISON:  01/16/2018 FINDINGS: Cardiac shadow remains enlarged. Postsurgical changes are again seen and stable. Right-sided PICC line has been removed in the interval. Aortic calcifications are again noted. The lungs are hyperinflated with chronic interstitial changes bilaterally stable from the previous exam. Some central vascular congestion and mild interstitial edema is noted. No large pleural effusion is seen. IMPRESSION: Changes of mild CHF.  No focal infiltrate is seen. Electronically Signed   By: Inez Catalina M.D.   On: 03/13/2018 12:01    EKG: Independently reviewed.  Afib with rate 81;LVH with no evidence of acute ischemia   Labs on Admission: I have personally reviewed the available labs and imaging studies at the time of the admission.  Pertinent labs:   Na++ 131 CO2 16 Glucose 138 BUN 40/Creatinine 1.69/GFR 27; baseline 1.1-1.3 Albumin 1.8 AST 43/ALT 29/Bili 0.9 BNP 2601.1; 724.9 on 9/30 Troponin 0.05 WBC 9.2 Hgb 6.7; 8.3 on 10/22, 7.3 on 11/12 MCV 88.1 Heme positive Lipids 10/22: 111/33/61/86  Assessment/Plan Principal Problem:   Acute on chronic systolic CHF (congestive heart failure) (HCC) Active Problems:   Diabetes mellitus type 2 with peripheral artery disease (HCC)   Hyperlipidemia   Essential hypertension   S/P TAVR (transcatheter aortic valve replacement)   CKD (chronic kidney disease), stage III (HCC)   Foot ulcer, right (HCC)   Acute blood loss anemia   Acute on chronic systolic CHF -Patient with known  h/o CHF presenting with worsening SOB, orthopnea, PNA -CXR consistent with mild pulmonary edema -Normal WBC count, no fever; will not give antibiotics at this time -Elevated BNP -With elevated BNP and abnl CXR, recurrent CHF probable as diagnosis -Will admit with telemetry -Will not request repeat echocardiogram - 8/26 echo with EF 55-60% but it was 50-55% in 7/19 -Decrease Norvasc to 5 mg daily due to risk of edema/CHF -Continue Cozaar for now but closely monitor renal function -Continue Lopressor -CHF order set utilized -Was given Lasix 80 mg x 1 in ER and will repeat with 40 mg IV BID -Continue [place] O2 for now prn -Repeat EKG in AM  ABLA due to GI bleeding -Patient with prior admission in September with melena at that time -GI did EGD which was negative -She is again heme positive -She received 4 units PRBC during that hospitalization and has again trended back down to <7 -Will again transfuse 2 units PRBC -GI to consult again  -Follow CBC; transfuse for Hgb <7 -Continue to monitor for recurrent bleeding  -She likely needs C-scope (last done about 5-6 years ago) -Clear liquids for now  Afib/s/p TAVR, on Eliquis -Rate controlled with Lopressor -Hold Eliquis in the setting of GI Bleed -Since this appears to have been subacute or chronic, will cover with heparin (risk:benefit currently appears to indicate that risk associated with valve/afib is higher than bleeding) -Pharmacy to dose Heparin - low dose, no bolus  DM -A1c 6.0 in 7/19 -Continue NPH -Will cover with moderate-scale SSI  HTN -Decrease dose of Norvasc -Continue Cozaar and Lopressor  HLD -  Continue Lipitor  CKD -AKI associated with hypoperfusion in the setting of volume overload -Follow creatinine -Continue Cozaar for now but will need to hold if creatinine continues to rise  R foot ulcer -Foot ulcer is present and draining but without apparent surrounding erythema -Foot xray negative for xray -No evidence  of current infection so will not give abx at this time -Wound care consult -Followed by vascular, had ABIs done recently and so will not repeat  DVT prophylaxis: Heparin drip Code Status:  DNR - confirmed with patient Family Communication: None present Disposition Plan:  Home once clinically improved Consults called: GI; CM; wound care; DM coordinator; SW; nutrition; PT  Admission status: Admit - It is my clinical opinion that admission to INPATIENT is reasonable and necessary because of the expectation that this patient will require hospital care that crosses at least 2 midnights to treat this condition based on the medical complexity of the problems presented.  Given the aforementioned information, the predictability of an adverse outcome is felt to be significant.    Karmen Bongo MD [facility]  If note is complete, please contact covering daytime or nighttime physician. [email or web address] Password [email or web address]  03/13/2018, 3:06 PM

## 2018-03-13 NOTE — ED Provider Notes (Signed)
[facility] EMERGENCY DEPARTMENT Provider Note   CSN: [id number] Arrival date & time: 03/13/18  1049     History   Chief Complaint No chief complaint on file.   HPI Kristina Summers is a 82 y.o. female.  HPI Patient presents with shortness of breath for the past week.  Denies chest pain.  No cough.  No new lower extremity swelling.  States she is taking all of her medications except for her medicines this morning.  Denies any fever or chills. Past Medical History:  Diagnosis Date  . Anemia   . CAD (coronary artery disease)    a. s/p CABG in 2000  . Carotid artery occlusion   . CKD (chronic kidney disease)   . Diastolic CHF ([place]) 65/7846  . Fibromyalgia   . GERD (gastroesophageal reflux disease)   . History of CVA (cerebrovascular accident) 05/24/2014  . History of GI bleed   . Hyperlipidemia   . Hypertension   . Obesity   . PAF (paroxysmal atrial fibrillation) (HCC)    a. on Eliquis  . Peripheral vascular disease ([place])   . 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 Type II diabetes mellitus ([place]) dx'd 1979    Patient Active Problem List   Diagnosis Date Noted  . Decreased hearing 02/03/2018  . History of myocardial infarction 02/03/2018  . Methicillin resistant Staphylococcus aureus infection 02/03/2018  . Acute blood loss anemia 12/22/2017  . MRSA bacteremia 12/12/2017  . CKD (chronic kidney disease), stage III ([place]) 12/11/2017  . UTI (urinary tract infection) 12/11/2017  . Foot ulcer, right ([place]) 12/11/2017  . 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 Severe aortic stenosis 10/11/2017  . Essential hypertension 06/10/2017  . Type II diabetes mellitus with renal manifestations ([place]) 06/10/2017  . Fall   . Fracture of distal end of right fibula 05/29/2014  . History of CVA (cerebrovascular accident)  05/24/2014  . Peripheral vascular disease, unspecified ([place]) 01/11/2014  . CAD (coronary artery disease)   . Paroxysmal atrial fibrillation   . Diabetes mellitus type 2 with peripheral artery disease ([place])   . Hyperlipidemia   . History of GI bleed 04/21/2009  . S/P CABG (coronary artery bypass graft) 11/20/1998    Past Surgical History:  Procedure Laterality Date  . ABDOMINAL AORTAGRAM N/A 04/01/2011   Procedure: ABDOMINAL AORTAGRAM;  Surgeon: Conrad Tillamook, MD;  Location: [facility] CATH LAB;  Service: Cardiovascular;  Laterality: N/A;  . ANGIOPLASTY  06/17/11   Left leg common femoral artery cannulation under u/s Left leg runoff  . CARDIAC CATHETERIZATION  10/11/2017  . CARPAL TUNNEL RELEASE Right   . CATARACT EXTRACTION W/ INTRAOCULAR LENS  IMPLANT, BILATERAL  2004-2005  . CORONARY ARTERY BYPASS GRAFT  2000   CABG X5  . ESOPHAGOGASTRODUODENOSCOPY (EGD) WITH PROPOFOL N/A 12/25/2017   Procedure: ESOPHAGOGASTRODUODENOSCOPY (EGD) WITH PROPOFOL;  Surgeon: Wonda Horner, MD;  Location: [facility] ENDOSCOPY;  Service: Endoscopy;  Laterality: N/A;  . EYE SURGERY Left    "lasered before cataract OR"  . LOWER EXTREMITY ANGIOGRAM Bilateral 04/01/2011   Procedure: LOWER EXTREMITY ANGIOGRAM;  Surgeon: Conrad [place], MD;  Location: [facility] CATH LAB;  Service: Cardiovascular;  Laterality: Bilateral;  . LOWER EXTREMITY ANGIOGRAM Left 06/17/2011   Procedure: LOWER EXTREMITY ANGIOGRAM;  Surgeon: Conrad [place], MD;  Location: [facility] CATH LAB;  Service: Cardiovascular;  Laterality: Left;  . LOWER EXTREMITY  ANGIOGRAM N/A 11/18/2011   Procedure: LOWER EXTREMITY ANGIOGRAM;  Surgeon: Conrad Guadalupe, MD;  Location: [facility] CATH LAB;  Service: Cardiovascular;  Laterality: N/A;  . PERCUTANEOUS STENT INTERVENTION Right 04/01/2011   Procedure: PERCUTANEOUS STENT INTERVENTION;  Surgeon: Conrad [place], MD;  Location: [facility] CATH LAB;  Service: Cardiovascular;  Laterality: Right;  rt iliac stent  . PERIPHERAL ARTERIAL STENT GRAFT  04/01/11   right common  iliac  . RIGHT/LEFT HEART CATH AND CORONARY/GRAFT ANGIOGRAPHY N/A 10/11/2017   Procedure: RIGHT/LEFT HEART CATH AND CORONARY/GRAFT ANGIOGRAPHY;  Surgeon: Sherren Mocha, MD;  Location: [facility] CV LAB;  Service: Cardiovascular;  Laterality: N/A;  . TEE WITHOUT CARDIOVERSION N/A 11/08/2017   Procedure: TRANSESOPHAGEAL ECHOCARDIOGRAM (TEE);  Surgeon: Sherren Mocha, MD;  Location: [facility];  Service: Open Heart Surgery;  Laterality: N/A;  . TEE WITHOUT CARDIOVERSION N/A 12/15/2017   Procedure: TRANSESOPHAGEAL ECHOCARDIOGRAM (TEE);  Surgeon: Larey Dresser, MD;  Location: [facility] ENDOSCOPY;  Service: Cardiovascular;  Laterality: N/A;  . TRANSCATHETER AORTIC VALVE REPLACEMENT, TRANSFEMORAL N/A 11/08/2017   Procedure: TRANSCATHETER AORTIC VALVE REPLACEMENT, TRANSFEMORAL;  Surgeon: Sherren Mocha, MD;  Location: [facility];  Service: Open Heart Surgery;  Laterality: N/A;  . TRIGGER FINGER RELEASE Left 1996   thumb     OB History   None      Home Medications    Prior to Admission medications   Medication Sig Start Date End Date Taking? Authorizing Provider  acetaminophen (TYLENOL) 500 MG tablet Take 500 mg by mouth daily as needed for moderate pain or headache.   Yes [provider]  amLODipine (NORVASC) 10 MG tablet Take 10 mg by mouth daily.    Yes [provider]  apixaban (ELIQUIS) 5 MG TABS tablet Take 1 tablet (5 mg total) by mouth 2 (two) times daily. 12/26/17  Yes Shelly Coss, MD  atorvastatin (LIPITOR) 80 MG tablet Take 1 tablet (80 mg total) by mouth daily. Patient taking differently: Take 80 mg by mouth daily at 6 PM.  11/27/12  Yes Baker, Freeman Caldron, PA-C  furosemide (LASIX) 40 MG tablet Take 40 mg by mouth 3 (three) times daily.  06/30/17  Yes [provider]  glucose 4 GM chewable tablet Chew 1-4 tablets by mouth once as needed for low blood sugar.    Yes [provider]  insulin NPH Human (HUMULIN N,NOVOLIN N) 100 UNIT/ML injection Inject 0.15 mLs (15  Units total) into the skin 2 (two) times daily. 15 units in the morning and 10 units in the PM Patient taking differently: Inject 10-15 Units into the skin See admin instructions. Take 15 units in the morning and 10 units in the evening 10/12/17  Yes Eileen Stanford, PA-C  insulin regular (NOVOLIN R,HUMULIN R) 100 units/mL injection Inject 5 Units into the skin 2 (two) times daily before a meal.    Yes [provider]  losartan (COZAAR) 50 MG tablet Take 1 tablet (50 mg total) by mouth daily. 11/22/17  Yes Eileen Stanford, PA-C  metoprolol tartrate (LOPRESSOR) 25 MG tablet Take 0.5 tablets (12.5 mg total) by mouth 2 (two) times daily. Patient taking differently: Take 25 mg by mouth 2 (two) times daily.  11/10/17  Yes Eileen Stanford, PA-C  Multiple Vitamin (MULTIVITAMIN WITH MINERALS) TABS tablet Take 1 tablet by mouth daily. One a Day 50 plus   Yes [provider]  nitroGLYCERIN (NITROSTAT) 0.4 MG SL tablet Place 0.4 mg under the tongue every 5 (five) minutes as needed for chest  pain.   Yes [provider]  senna (SENOKOT) 8.6 MG TABS tablet Take 1 tablet by mouth as needed for mild constipation.   Yes [provider]    Family History Family History  Problem Relation Age of Onset  . Other Brother        intestinal blockage  . Diabetes Brother     Social History Social History   Tobacco Use  . Smoking status: Former Smoker    Packs/day: 2.00    Years: 30.00    Pack years: 60.00    Types: Cigarettes    Last attempt to quit: 04/19/1990    Years since quitting: 27.9  . Smokeless tobacco: Never Used  . Tobacco comment: stopped smoking cigarettes 1991  Substance Use Topics  . Alcohol use: Not Currently    Alcohol/week: 0.0 standard drinks    Comment: "tried different alcohols when I was 1st married; never drank much at  ALL"  . Drug use: Never     Allergies   Morphine and related; Lisinopril; and Zoloft [sertraline hcl]   Review of  Systems Review of Systems  Constitutional: Positive for fatigue. Negative for chills and fever.  Eyes: Negative for visual disturbance.  Respiratory: Positive for shortness of breath. Negative for cough.   Cardiovascular: Negative for chest pain, palpitations and leg swelling.  Gastrointestinal: Negative for abdominal pain, constipation, diarrhea, nausea and vomiting.  Genitourinary: Negative for dysuria, flank pain and frequency.  Musculoskeletal: Negative for back pain, myalgias, neck pain and neck stiffness.  Skin: Negative for rash and wound.  Neurological: Negative for dizziness, weakness, light-headedness, numbness and headaches.  All other systems reviewed and are negative.    Physical Exam Updated Vital Signs BP (!) 143/62   Pulse 93   Resp (!) 27   Ht 5\' 6"  (1.676 m)   Wt 86.2 kg   SpO2 100%   BMI 30.67 kg/m   Physical Exam  Constitutional: She is oriented to person, place, and time. She appears well-developed and well-nourished.  HENT:  Head: Normocephalic and atraumatic.  Mouth/Throat: Oropharynx is clear and moist. No oropharyngeal exudate.  Eyes: Pupils are equal, round, and reactive to light. EOM are normal.  Neck: Normal range of motion. Neck supple.  Cardiovascular: Normal rate and regular rhythm.  Pulmonary/Chest: Effort normal. No respiratory distress. She has no wheezes. She has rales.  Crackles in bilateral lungs.  Mild dyspnea especially with speaking.  Abdominal: Soft. Bowel sounds are normal. She exhibits no distension and no mass. There is no tenderness. There is no guarding.  Musculoskeletal: Normal range of motion. She exhibits no edema or tenderness.  Mild erythema and warmth to the right lower extremity compared to the left.  No calf asymmetry or tenderness.  Neurological: She is alert and oriented to person, place, and time.  Moving all extremities without focal deficit.  Sensation intact.  Skin: Skin is warm and dry. No rash noted. There is  erythema.  Psychiatric: She has a normal mood and affect. Her behavior is normal.  Nursing note and vitals reviewed.    ED Treatments / Results  Labs (all labs ordered are listed, but only abnormal results are displayed) Labs Reviewed  CBC WITH DIFFERENTIAL/PLATELET - Abnormal; Notable for the following components:      Result Value   RBC 2.60 (*)    Hemoglobin 6.7 (*)    HCT 22.9 (*)    MCH 25.8 (*)    MCHC 29.3 (*)    RDW 15.9 (*)  Lymphs Abs 0.6 (*)    All other components within normal limits  COMPREHENSIVE METABOLIC PANEL - Abnormal; Notable for the following components:   Sodium 131 (*)    CO2 16 (*)    Glucose, Bld 138 (*)    BUN 40 (*)    Creatinine, Ser 1.69 (*)    Calcium 8.2 (*)    Albumin 1.8 (*)    AST 43 (*)    GFR calc non Af Amer 27 (*)    GFR calc Af Amer 32 (*)    All other components within normal limits  BRAIN NATRIURETIC PEPTIDE - Abnormal; Notable for the following components:   B Natriuretic Peptide 2,601.1 (*)    All other components within normal limits  TROPONIN I - Abnormal; Notable for the following components:   Troponin I 0.05 (*)    All other components within normal limits  POC OCCULT BLOOD, ED - Abnormal; Notable for the following components:   Fecal Occult Bld POSITIVE (*)    All other components within normal limits  URINALYSIS, ROUTINE W REFLEX MICROSCOPIC  TYPE AND SCREEN    EKG EKG Interpretation  Date/Time:  Monday March 13 2018 11:03:45 EST Ventricular Rate:  91 PR Interval:    QRS Duration: 121 QT Interval:  374 QTC Calculation: 461 R Axis:   86 Text Interpretation:  Atrial fibrillation Probable left ventricular hypertrophy Confirmed by Julianne Rice [phone]) on 03/13/2018 11:20:22 AM   Radiology Dg Chest 2 View  Result Date: 03/13/2018 CLINICAL DATA:  Shortness of breath EXAM: CHEST - 2 VIEW COMPARISON:  01/16/2018 FINDINGS: Cardiac shadow remains enlarged. Postsurgical changes are again seen and stable.  Right-sided PICC line has been removed in the interval. Aortic calcifications are again noted. The lungs are hyperinflated with chronic interstitial changes bilaterally stable from the previous exam. Some central vascular congestion and mild interstitial edema is noted. No large pleural effusion is seen. IMPRESSION: Changes of mild CHF.  No focal infiltrate is seen. Electronically Signed   By: Inez Catalina M.D.   On: 03/13/2018 12:01    Procedures Procedures (including critical care time)  Medications Ordered in ED Medications  furosemide (LASIX) injection 80 mg (80 mg Intravenous Given 03/13/18 1306)    CRITICAL CARE Performed by: Julianne Rice Total critical care time: 30 minutes Critical care time was exclusive of separately billable procedures and treating other patients. Critical care was necessary to treat or prevent imminent or life-threatening deterioration. Critical care was time spent personally by me on the following activities: development of treatment plan with patient and/or surrogate as well as nursing, discussions with consultants, evaluation of patient's response to treatment, examination of patient, obtaining history from patient or surrogate, ordering and performing treatments and interventions, ordering and review of laboratory studies, ordering and review of radiographic studies, pulse oximetry and re-evaluation of patient's condition. Initial Impression / Assessment and Plan / ED Course  I have reviewed the triage vital signs and the nursing notes.  Pertinent labs & imaging results that were available during my care of the patient were reviewed by me and considered in my medical decision making (see chart for details).    Spoke with Dr. Cristina Gong who will consult on patient.  Will start diuresis and patient will likely need transfusion for her anemia.  Hospitalist will see patient in the emergency department and admit. Final Clinical Impressions(s) / ED Diagnoses    Final diagnoses:  Acute on chronic congestive heart failure, unspecified heart failure type ([place])  Symptomatic anemia  Gastrointestinal hemorrhage, unspecified gastrointestinal hemorrhage type    ED Discharge Orders    None       Julianne Rice, MD 03/13/18 1421

## 2018-03-13 NOTE — ED Triage Notes (Signed)
Pt in from [facility] for eval for SOB x 1 week, pt in A fib, Hr 80s irregular, hx of the same, takes Eliquis, pt 96% on RA, A&O x4, denies CP, afebrile, denies cough, hx of CHF, takes Lasix reports compliance, no edema noted

## 2018-03-14 ENCOUNTER — Encounter (HOSPITAL_COMMUNITY): Payer: Self-pay | Admitting: General Practice

## 2018-03-14 ENCOUNTER — Encounter (HOSPITAL_COMMUNITY)
Admission: EM | Disposition: A | Discharge status: Home / Self Care | Payer: Self-pay | Source: Home / Self Care | Attending: Internal Medicine

## 2018-03-14 ENCOUNTER — Other Ambulatory Visit: Payer: Self-pay

## 2018-03-14 DIAGNOSIS — E782 Mixed hyperlipidemia: Secondary | ICD-10-CM

## 2018-03-14 DIAGNOSIS — D649 Anemia, unspecified: Secondary | ICD-10-CM

## 2018-03-14 DIAGNOSIS — K922 Gastrointestinal hemorrhage, unspecified: Secondary | ICD-10-CM

## 2018-03-14 DIAGNOSIS — I480 Paroxysmal atrial fibrillation: Secondary | ICD-10-CM

## 2018-03-14 DIAGNOSIS — I10 Essential (primary) hypertension: Secondary | ICD-10-CM

## 2018-03-14 HISTORY — PX: GIVENS CAPSULE STUDY: SHX5432

## 2018-03-14 LAB — CBC WITH DIFFERENTIAL/PLATELET
ABS IMMATURE GRANULOCYTES: 0.06 10*3/uL (ref 0.00–0.07)
Abs Immature Granulocytes: 0.04 10*3/uL (ref 0.00–0.07)
BASOS ABS: 0 10*3/uL (ref 0.0–0.1)
Basophils Absolute: 0.1 10*3/uL (ref 0.0–0.1)
Basophils Relative: 0 %
Basophils Relative: 0 %
EOS ABS: 0.4 10*3/uL (ref 0.0–0.5)
Eosinophils Absolute: 0.4 10*3/uL (ref 0.0–0.5)
Eosinophils Relative: 4 %
Eosinophils Relative: 4 %
HCT: 30.8 % — ABNORMAL LOW (ref 36.0–46.0)
HCT: 31.6 % — ABNORMAL LOW (ref 36.0–46.0)
HEMOGLOBIN: 9.6 g/dL — AB (ref 12.0–15.0)
Hemoglobin: 9.7 g/dL — ABNORMAL LOW (ref 12.0–15.0)
IMMATURE GRANULOCYTES: 0 %
Immature Granulocytes: 1 %
LYMPHS PCT: 6 %
Lymphocytes Relative: 5 %
Lymphs Abs: 0.7 10*3/uL (ref 0.7–4.0)
Lymphs Abs: 0.5 10*3/uL — ABNORMAL LOW (ref 0.7–4.0)
MCH: 26.7 pg (ref 26.0–34.0)
MCH: 25.9 pg — ABNORMAL LOW (ref 26.0–34.0)
MCHC: 31.2 g/dL (ref 30.0–36.0)
MCHC: 30.7 g/dL (ref 30.0–36.0)
MCV: 85.6 fL (ref 80.0–100.0)
MCV: 84.5 fL (ref 80.0–100.0)
MONO ABS: 1 10*3/uL (ref 0.1–1.0)
MONO ABS: 0.7 10*3/uL (ref 0.1–1.0)
MONOS PCT: 8 %
MONOS PCT: 7 %
NEUTROS ABS: 9.7 10*3/uL — AB (ref 1.7–7.7)
NEUTROS ABS: 9.5 10*3/uL — AB (ref 1.7–7.7)
Neutrophils Relative %: 81 %
Neutrophils Relative %: 84 %
Platelets: 276 10*3/uL (ref 150–400)
Platelets: 287 10*3/uL (ref 150–400)
RBC: 3.6 MIL/uL — ABNORMAL LOW (ref 3.87–5.11)
RBC: 3.74 MIL/uL — AB (ref 3.87–5.11)
RDW: 17.2 % — ABNORMAL HIGH (ref 11.5–15.5)
RDW: 17.2 % — AB (ref 11.5–15.5)
WBC: 11.9 10*3/uL — ABNORMAL HIGH (ref 4.0–10.5)
WBC: 11.2 10*3/uL — AB (ref 4.0–10.5)
nRBC: 0 % (ref 0.0–0.2)
nRBC: 0 % (ref 0.0–0.2)

## 2018-03-14 LAB — BASIC METABOLIC PANEL
ANION GAP: 6 (ref 5–15)
BUN: 35 mg/dL — AB (ref 8–23)
CALCIUM: 8.2 mg/dL — AB (ref 8.9–10.3)
CO2: 21 mmol/L — AB (ref 22–32)
Chloride: 105 mmol/L (ref 98–111)
Creatinine, Ser: 1.57 mg/dL — ABNORMAL HIGH (ref 0.44–1.00)
GFR calc Af Amer: 35 mL/min — ABNORMAL LOW (ref >60)
GFR, EST NON AFRICAN AMERICAN: 30 mL/min — AB (ref >60)
GLUCOSE: 141 mg/dL — AB (ref 70–99)
POTASSIUM: 4.8 mmol/L (ref 3.5–5.1)
Sodium: 132 mmol/L — ABNORMAL LOW (ref 135–145)

## 2018-03-14 LAB — GLUCOSE, CAPILLARY
GLUCOSE-CAPILLARY: 133 mg/dL — AB (ref 70–99)
GLUCOSE-CAPILLARY: 164 mg/dL — AB (ref 70–99)
Glucose-Capillary: 117 mg/dL — ABNORMAL HIGH (ref 70–99)
Glucose-Capillary: 278 mg/dL — ABNORMAL HIGH (ref 70–99)

## 2018-03-14 LAB — HEPARIN LEVEL (UNFRACTIONATED)
HEPARIN UNFRACTIONATED: 0.38 [IU]/mL (ref 0.30–0.70)
Heparin Unfractionated: 0.4 IU/mL (ref 0.30–0.70)

## 2018-03-14 SURGERY — IMAGING PROCEDURE, GI TRACT, INTRALUMINAL, VIA CAPSULE
Anesthesia: LOCAL

## 2018-03-14 MED ORDER — JUVEN PO PACK
1.0000 | PACK | Freq: Two times a day (BID) | ORAL | Status: DC
  Administered 2018-03-14 – 2018-03-18 (×8): 1 via ORAL
  Filled 2018-03-14 (×9): qty 1

## 2018-03-14 MED ORDER — ADULT MULTIVITAMIN W/MINERALS CH
1.0000 | ORAL_TABLET | Freq: Every day | ORAL | Status: DC
  Administered 2018-03-14 – 2018-03-18 (×5): 1 via ORAL
  Filled 2018-03-14 (×5): qty 1

## 2018-03-14 MED ORDER — BOOST / RESOURCE BREEZE PO LIQD CUSTOM
1.0000 | Freq: Three times a day (TID) | ORAL | Status: DC
  Administered 2018-03-14 – 2018-03-17 (×10): 1 via ORAL

## 2018-03-14 MED ORDER — HYDROCERIN EX CREA
TOPICAL_CREAM | Freq: Two times a day (BID) | CUTANEOUS | Status: DC
  Administered 2018-03-14 – 2018-03-17 (×8): via TOPICAL
  Filled 2018-03-14: qty 113

## 2018-03-14 SURGICAL SUPPLY — 1 items: TOWEL COTTON PACK 4EA (MISCELLANEOUS) ×4 IMPLANT

## 2018-03-14 NOTE — Progress Notes (Addendum)
Inpatient Diabetes Program Recommendations  AACE/ADA: New Consensus Statement on Inpatient Glycemic Control (2015)  Target Ranges:  Prepandial:   less than 140 mg/dL      Peak postprandial:   less than 180 mg/dL (1-2 hours)      Critically ill patients:  140 - 180 mg/dL   Lab Results  Component Value Date   GLUCAP 117 (H) 03/14/2018   HGBA1C 6.0 (H) 11/04/2017    Review of Glycemic ControlResults for ULDINE, FUSTER (MRN [id number]) as of 03/14/2018 12:15  Ref. Range 03/13/2018 17:43 03/13/2018 18:30 03/13/2018 21:13 03/14/2018 07:27 03/14/2018 11:38  Glucose-Capillary Latest Ref Range: 70 - 99 mg/dL 68 (L) 138 (H) 142 (H) 133 (H) 117 (H)   Diabetes history: DM 2 Outpatient Diabetes medications:  NPH 15 units q AM and NPH 10 units q PM Novolin R- 5 units bid Current orders for Inpatient glycemic control:  Novolog moderate tid with meals and HS NPH 15 units in the AM and 10 units in the PM  Inpatient Diabetes Program Recommendations:   Consider reducing Novolog correction to sensitive tid with meals and HS. Also may consider reducing NPH to 10 units q AM.   Thanks,  Adah Perl, RN, BC-ADM Inpatient Diabetes Coordinator Pager [phone] (8a-5p)

## 2018-03-14 NOTE — Progress Notes (Signed)
[place] for heparin Indication: atrial fibrillation s/p TAVR   Allergies  Allergen Reactions  . Morphine And Related Nausea And Vomiting and Other (See Comments)    Chest pain, also   . Lisinopril Cough  . Zoloft [Sertraline Hcl] Nausea Only         Patient Measurements: Height: 5\' 6"  (167.6 cm) Weight: 184 lb 4.8 oz (83.6 kg) IBW/kg (Calculated) : 59.3 Heparin Dosing Weight: 77 kg  Vital Signs: Temp: 98.3 F (36.8 C) (11/26 0115) Temp Source: Oral (11/26 0115) BP: 147/59 (11/26 0115) Pulse Rate: 79 (11/26 0115)  Labs: Recent Labs    03/13/18 1114 03/13/18 1834 03/14/18 0019  HGB 6.7* 7.1*  --   HCT 22.9* 23.4*  --   PLT 253 286  --   HEPARINUNFRC  --   --  0.40  CREATININE 1.69*  --   --   TROPONINI 0.05*  --   --     Estimated Creatinine Clearance: 28.9 mL/min (A) (by C-G formula based on SCr of 1.69 mg/dL (H)).  Assessment: 82 y.o. female with Afib for heparin  Goal of Therapy:  Heparin level 0.3-0.5 units/ml Monitor platelets by anticoagulation protocol: Yes   Plan:  Continue Heparin at current rate  Follow-up am labs.  Phillis Knack, PharmD, BCPS

## 2018-03-14 NOTE — Progress Notes (Signed)
Patient swallowed Capsule Pill camera study at 1583 with no issues. Instructions given to patient and patient's nurse.

## 2018-03-14 NOTE — Care Management Note (Signed)
Case Management Note  Patient Details  Name: Kristina Summers MRN: [id number] Date of Birth: 09-Mar-1936  Subjective/Objective:    CHF, acute symptomatic anemia, due to lower GIB, AKI on CKD III,  Right foot ulcer               Action/Plan: NCM spoke to pt and she has [place] with [facility]. She has cane, bedside commode and Rollator at home. She recently moved into [place] apt, Her dtr, [name] is a Marine scientist. Lives alone. Contacted Carolyn with [facility]. Pt active with [facility]. Contacted attending for [facility], and PT orders with [facility]. Will continue to follow up for dc needs.   Expected Discharge Date:                  Expected Discharge Plan:  [place]  In-House Referral:  NA  Discharge planning Services  CM Consult  Post Acute Care Choice:  Home Health, Resumption of Svcs/PTA Provider Choice offered to:  Patient  DME Arranged:  N/A DME Agency:  NA  HH Arranged:  PT, RN [place] Agency:  [facility]  Status of Service:  In process, will continue to follow  If discussed at Long Length of Stay Meetings, dates discussed:    Additional Comments:  Erenest Rasher, RN 03/14/2018, 2:27 PM

## 2018-03-14 NOTE — Progress Notes (Signed)
No bm's today.  Capsule camera study in progress.  BUN has dropped from 40 to 35.  Supra-normal rise in hgb from 7.1 to 9.6 following Tx 2 u prc's  PLAN: Await capsule endo results (hopefully ready late tomorrow).  Cleotis Nipper, M.D. Pager [phone] If no answer or after 5 PM call [phone]

## 2018-03-14 NOTE — Consult Note (Addendum)
[facility] Nurse wound consult note Reason for Consult: Full thickness neuropathic wound on right foot, 5th metatarsal head Wound type:Neuropathic Pressure Injury POA: N/A Measurement: Ulcer measures 0.6cm x 0.8cm x 0.2cm and it is surrounded by a 1cm x 1.5cm oval callus Wound bed:red, moist Drainage (amount, consistency, odor) None Periwound:Callus as described above (white). No fluctuance, no erythema or induration.  The LLE presents with numerous (>20) pinpoint scabbed areas, primarily on the lateral LE.  These are not consistent with a scabies infestation as they are not linear and instead are more scattered. There are a few <5 on the patients RLE, again, in a scattered distribution. The etiology is not known to this Probation officer. Dressing procedure/placement/frequency: I have provided Nursing with guidance via the Orders for twice daily cleansing with our house skin cleanser (pH balanced and no rinse) and moisturizing with Eucerin cream. Topical wound care to the right 5th met head full thickness wound with surrounding callus will be to place a saline moistened gauze dressing topped with a dry dressing twice daily. I have provided pressure redistribution chair pad for her use when OOB in chair, pressure redistribution heel boots for her use while in bed and a prophylactic foam dressing to her sacrum to prevent pressure injury. Patient would benefit from follow up with her PCP, orthopedic or podiatric MD or an outpatient [facility] of her choosing for paring of this callus and monitoring of the wound to avoid infection. An orthotic is likely required to offload the affected area.  If you agree, please refer for these services upon discharge.  [facility] nursing team will not follow, but will remain available to this patient, the nursing and medical teams.  Please re-consult if needed. Thanks, Maudie Flakes, MSN, RN, [place], Arther Abbott  Pager# [phone]

## 2018-03-14 NOTE — Progress Notes (Addendum)
Note placed for wrong patient

## 2018-03-14 NOTE — Evaluation (Signed)
Physical Therapy Evaluation Patient Details Name: Kristina Summers MRN: [id number] DOB: Aug 29, 1935 Today's Date: 03/14/2018   History of Present Illness  Pt is an 82 y.o. female admitted 03/13/18 with SOB. Worked up for CHF exacerbation, complicated by low Hgb and heme positive stool. Pt undergoing capsule endoscopy 11/26. PMH includes CHF, CKD, HTN, CVA (2016), DM2, s/p CABG, s/p  TAVR (2019), PVD, fibromyalgia, R foot ulcer.    Clinical Impression  Pt presents with an overall decrease in functional mobility secondary to above. PTA, pt lives alone with children nearby for PRN assist; mod indep with rollator for community ambulation. Today, pt ambulatory with RW and supervision to monitor SpO2; indep with ADLs. SpO2 89-93% on RA with mobility; pt with difficulty speaking in complete sentences due to SOB at rest. Pt would benefit from continued acute PT services to maximize functional mobility and independence prior to d/c with HHPT services.     Follow Up Recommendations Home health PT    Equipment Recommendations  None recommended by PT    Recommendations for Other Services       Precautions / Restrictions Precautions Precautions: Fall Restrictions Weight Bearing Restrictions: No      Mobility  Bed Mobility Overal bed mobility: Modified Independent             General bed mobility comments: HOB elevated  Transfers Overall transfer level: Modified independent Equipment used: Rolling walker (2 wheeled);None             General transfer comment: Mod indep standing from bed and toilet with and without RW  Ambulation/Gait Ambulation/Gait assistance: Supervision Gait Distance (Feet): 400 Feet Assistive device: Rolling walker (2 wheeled) Gait Pattern/deviations: Step-through pattern;Decreased stride length;Trunk flexed Gait velocity: Decreased Gait velocity interpretation: 1.31 - 2.62 ft/sec, indicative of limited community ambulator General Gait Details: Ambulation in  room without DME; hallway ambulation with RW. Supervision to monitor SpO2 and assist for IV pole. SpO2 down to 89-94% on RA with ambulation. DOE 3/4  Stairs            Wheelchair Mobility    Modified Rankin (Stroke Patients Only)       Balance Overall balance assessment: Needs assistance   Sitting balance-Leahy Scale: Good       Standing balance-Leahy Scale: Fair                               Pertinent Vitals/Pain Pain Assessment: No/denies pain    Home Living Family/patient expects to be discharged to:: Private residence Living Arrangements: Alone Available Help at Discharge: Family;Available PRN/intermittently Type of Home: Apartment Home Access: Stairs to enter Entrance Stairs-Rails: Right Entrance Stairs-Number of Steps: 1 Home Layout: One level Home Equipment: Walker - 4 wheels;Cane - single point;Tub bench      Prior Function Level of Independence: Independent with assistive device(s)   Gait / Transfers Assistance Needed: Household ambulation without DME. Uses rollator for community ambulation. Enjoys walking to community center nearby  ADL's / Homemaking Assistance Needed: Daughter drives for errands; transportation to MD appointments  Comments: Poor vision; typically only walks in familiar environments     Hand Dominance        Extremity/Trunk Assessment   Upper Extremity Assessment Upper Extremity Assessment: Overall WFL for tasks assessed    Lower Extremity Assessment Lower Extremity Assessment: Overall WFL for tasks assessed    Cervical / Trunk Assessment Cervical / Trunk Assessment: Kyphotic  Communication   Communication: No  difficulties  Cognition Arousal/Alertness: Awake/alert Behavior During Therapy: WFL for tasks assessed/performed Overall Cognitive Status: Within Functional Limits for tasks assessed                                        General Comments      Exercises     Assessment/Plan     PT Assessment Patient needs continued PT services  PT Problem List Decreased strength;Decreased activity tolerance;Decreased balance;Decreased mobility;Cardiopulmonary status limiting activity       PT Treatment Interventions Gait training;Stair training;Functional mobility training;Therapeutic activities;DME instruction;Therapeutic exercise;Balance training;Patient/family education    PT Goals (Current goals can be found in the Care Plan section)  Acute Rehab PT Goals Patient Stated Goal: Return home PT Goal Formulation: With patient Time For Goal Achievement: 03/28/18 Potential to Achieve Goals: Good    Frequency Min 3X/week   Barriers to discharge        Co-evaluation               AM-PAC PT "6 Clicks" Mobility  Outcome Measure Help needed turning from your back to your side while in a flat bed without using bedrails?: None Help needed moving from lying on your back to sitting on the side of a flat bed without using bedrails?: None Help needed moving to and from a bed to a chair (including a wheelchair)?: None Help needed standing up from a chair using your arms (e.g., wheelchair or bedside chair)?: A Little Help needed to walk in hospital room?: A Little Help needed climbing 3-5 steps with a railing? : A Little 6 Click Score: 21    End of Session Equipment Utilized During Treatment: Gait belt Activity Tolerance: Patient tolerated treatment well Patient left: in chair;with call bell/phone within reach;with chair alarm set Nurse Communication: Mobility status PT Visit Diagnosis: Other abnormalities of gait and mobility (R26.89)    Time: 0630-1601 PT Time Calculation (min) (ACUTE ONLY): 39 min   Charges:   PT Evaluation $PT Eval Moderate Complexity: 1 Mod PT Treatments $Gait Training: 8-22 mins $Therapeutic Activity: 8-22 mins      Kristina Summers, PT, DPT Acute Rehabilitation Services  Pager [phone] Office [phone]  Kristina Summers 03/14/2018,  11:30 AM

## 2018-03-14 NOTE — Clinical Social Work Note (Signed)
CSW acknowledges consult "Access Meds for Discharge." Please consult RNCM.  CSW signing off. Consult again if any social work needs arise.  Dayton Scrape, [place]

## 2018-03-14 NOTE — Progress Notes (Signed)
PROGRESS NOTE    Allan Bacigalupi  KDT:[id number] DOB: Sep 16, 1935 DOA: 03/13/2018 PCP: [facility]    Brief Narrative:  82 year old female who presented with dyspnea.  She does have significant past medical history for type 2 diabetes mellitus, peripheral vascular disease, atrial fibrillation, hypertension, dyslipidemia, diastolic heart failure, chronic kidney disease, coronary artery disease status post bypass grafting, aortic stenosis status post TAVR in July 2019.  Recent hospitalizations in 2 occasions 08/20019 for MRSA bacteremia and 12/2017 acute anemia requiring 3 units of packed red blood cell transfusion.  Reporting worsening dyspnea for the last 7 days, worse with exertion, associated with orthopnea and PND along with lower extremity edema.  On the initial physical examination blood pressure 165/66, heart rate 88, respirate 25, oxygen saturation 99%, heart S1-S2 present irregularly irregular, lungs with bibasilar rales, abdomen soft nontender, no lower extremity edema.  Sodium 131, potassium 3.6, chloride 108, bicarb 16, glucose 138, BUN 40, creatinine 1.69, BNP 2601, white count 9.2, hemoglobin 6.7, hematocrit 22.9, platelets 253.  Chest radiograph with increased interstitial markings bilaterally consistent with pulmonary edema.  EKG atrial fibrillation, normal axis, intraventricular conduction delay.  Patient was admitted to the hospital working diagnosis of acute decompensation of chronic diastolic heart failure complicated by acute symptomatic anemia.   Assessment & Plan:   Principal Problem:   Acute on chronic systolic CHF (congestive heart failure) (HCC) Active Problems:   Paroxysmal atrial fibrillation   Diabetes mellitus type 2 with peripheral artery disease (HCC)   Hyperlipidemia   Essential hypertension   S/P TAVR (transcatheter aortic valve replacement)   CKD (chronic kidney disease), stage III (HCC)   Foot ulcer, right (HCC)   Acute blood loss anemia   1. Acute on  chronic diastolic heart failure. Aortic stenosis sp TAVR. Will continue aggressive diuresis with IV furosemide, urine output over last 24 hours 1,302 ml, with improvement of her symptoms but not yet back to baseline. Continue heart failure management with losartan and metoprolol. Old records personally reviewed echocardiogram from 08.219, with preserved LV systolic function.   2. Acute symptomatic anemia, due to acute blood loss, due to lower GI bleed. Patient tolerated well 2 units pbrc transfusion, hb and hct this am 9,6 and 30,8. Follow with capsule endoscopy, follow with GI recommendations. Continue proton pump inhibitors. Continue holding aspirin and apixaban. On IV heparin for now.   3. Chronic atrial fibrillation. Continue rate control with metoprolol, holding anticoagulation due to acute blood loss anemia. Continue telemetry monitoring.   4. AKI on CKD stage 3.  Renal function with serum cr at 1,57 today, K at 4,8 and serum bicarbonate at 21. Will continue diuresis with furosemide, follow on renal panel in am, avoid hypotension or nephrotoxic medications.   5. T2DM. Continue insulin sliding scale for glucose cover and monitoring, continue basal insulin with NPH 15 U am and 10 U pm. Fasting glucose 141.   6. Right foot ulcer. Follow with wound care, no antibiotics indicated at this point.   7. HTN. Continue amlodipine and metoprolol for blood pressure control.  8. Dyslipidemia. Continue atorvastatin.    DVT prophylaxis: scd   Code Status: dnr  Family Communication: no family at the bedside  Disposition Plan/ discharge barriers: pending clinical improvement  Body mass index is 30.38 kg/m. Malnutrition Type:      Malnutrition Characteristics:      Nutrition Interventions:     RN Pressure Injury Documentation:     Consultants:   GI   Procedures:  Antimicrobials:       Subjective: Dyspnea and lower extremity edema have improved but not back to baseline, no  nausea or vomiting, no chest pain. At home she did have dark stools.   Objective: Vitals:   03/14/18 0249 03/14/18 0418 03/14/18 1057 03/14/18 1058  BP: (!) 149/68 (!) 138/58 (!) 136/55 (!) 136/55  Pulse: 84 77 96   Resp: 18 18    Temp: 97.8 F (36.6 C) 98.3 F (36.8 C)    TempSrc: Oral Oral    SpO2: 98% 95%    Weight: 85.4 kg     Height:        Intake/Output Summary (Last 24 hours) at 03/14/2018 1110 Last data filed at 03/14/2018 1059 Gross per 24 hour  Intake 1356 ml  Output 2052 ml  Net -696 ml   Filed Weights   03/13/18 1113 03/13/18 1713 03/14/18 0249  Weight: 86.2 kg 83.6 kg 85.4 kg    Examination:   General: deconditioned  Neurology: Awake and alert, non focal  E ENT: mild pallor, no icterus, oral mucosa moist Cardiovascular: No JVD. S1-S2 present, rhythmic, no gallops, rubs, or murmurs. No lower extremity edema. Pulmonary: positive breath sounds bilaterally, adequate air movement, no wheezing, rhonchi or rales. Gastrointestinal. Abdomen with no organomegaly, non tender, no rebound or guarding Skin. No rashes Musculoskeletal: no joint deformities     Data Reviewed: I have personally reviewed following labs and imaging studies  CBC: Recent Labs  Lab 03/13/18 1114 03/13/18 1834 03/14/18 0555  WBC 9.2 10.2 11.9*  NEUTROABS 7.5 8.4* 9.7*  HGB 6.7* 7.1* 9.6*  HCT 22.9* 23.4* 30.8*  MCV 88.1 86.7 85.6  PLT 253 286 332   Basic Metabolic Panel: Recent Labs  Lab 03/13/18 1114 03/14/18 0555  NA 131* 132*  K 3.6 4.8  CL 108 105  CO2 16* 21*  GLUCOSE 138* 141*  BUN 40* 35*  CREATININE 1.69* 1.57*  CALCIUM 8.2* 8.2*   GFR: Estimated Creatinine Clearance: 31.4 mL/min (A) (by C-G formula based on SCr of 1.57 mg/dL (H)). Liver Function Tests: Recent Labs  Lab 03/13/18 1114  AST 43*  ALT 29  ALKPHOS 103  BILITOT 0.9  PROT 7.2  ALBUMIN 1.8*   No results for input(s): LIPASE, AMYLASE in the last 168 hours. No results for input(s): AMMONIA in  the last 168 hours. Coagulation Profile: No results for input(s): INR, PROTIME in the last 168 hours. Cardiac Enzymes: Recent Labs  Lab 03/13/18 1114  TROPONINI 0.05*   BNP (last 3 results) No results for input(s): PROBNP in the last 8760 hours. HbA1C: No results for input(s): HGBA1C in the last 72 hours. CBG: Recent Labs  Lab 03/13/18 1743 03/13/18 1830 03/13/18 2113 03/14/18 0727  GLUCAP 68* 138* 142* 133*   Lipid Profile: No results for input(s): CHOL, HDL, LDLCALC, TRIG, CHOLHDL, LDLDIRECT in the last 72 hours. Thyroid Function Tests: No results for input(s): TSH, T4TOTAL, FREET4, T3FREE, THYROIDAB in the last 72 hours. Anemia Panel: No results for input(s): VITAMINB12, FOLATE, FERRITIN, TIBC, IRON, RETICCTPCT in the last 72 hours.    Radiology Studies: I have reviewed all of the imaging during this hospital visit personally     Scheduled Meds: . amLODipine  5 mg Oral Daily  . aspirin EC  81 mg Oral Daily  . atorvastatin  80 mg Oral q1800  . Chlorhexidine Gluconate Cloth  6 each Topical Q0600  . furosemide  40 mg Intravenous Q12H  . hydrocerin   Topical BID  .  insulin aspart  0-15 Units Subcutaneous TID WC  . insulin aspart  0-5 Units Subcutaneous QHS  . insulin NPH Human  10 Units Subcutaneous QHS  . insulin NPH Human  15 Units Subcutaneous QAC breakfast  . losartan  50 mg Oral Daily  . metoprolol tartrate  25 mg Oral BID  . mupirocin ointment  1 application Nasal BID  . pantoprazole (PROTONIX) IV  40 mg Intravenous Q12H  . sodium chloride flush  3 mL Intravenous Q12H   Continuous Infusions: . sodium chloride    . heparin 1,000 Units/hr (03/13/18 2050)     LOS: 1 day        Sendy Pluta Gerome Apley, MD [facility] Pager [phone]

## 2018-03-14 NOTE — Progress Notes (Signed)
Initial Nutrition Assessment  DOCUMENTATION CODES:   Obesity unspecified  INTERVENTION:   -1 packet Juven BID, each packet provides 80 calories, 8 grams of carbohydrate, and 14 grams of amino acids; supplement contains CaHMB, glutamine, and arginine, to promote wound healing -MVI with minerals daily  NUTRITION DIAGNOSIS:   Increased nutrient needs related to wound healing as evidenced by estimated needs.  GOAL:   Patient will meet greater than or equal to 90% of their needs  MONITOR:   PO intake, Supplement acceptance, Labs, Weight trends, Skin, Diet advancement, I & O's  REASON FOR ASSESSMENT:   Consult Wound healing  ASSESSMENT:   Kristina Summers is a 82 y.o. [place] female with medical history significant of DM; PVD; afib on [place]; s/p TAVR 11/08/17; HTN; HLD; CVA; diastolic CHF; CAD s/p CABG; and CKD presenting with SOB.  She was admitted from 8/25-30 for MRSA bacteremia, on IV vanc x 6 weeks.  She returned from 9/5-9 for ABLA s/p 3 units PRBC with negative EGD.  She reports having SOB x 1 week.  Slight nonproductive cough.  SOB is mainly exertional.  She doesn't feel normal even while sitting.  +orthopnea, PND.  She has had difficulty sleeping this past week.  No CP.  +LE ankles, more chronic.  +diabetic foot ulcer on R foot.  No weight changes.  She can't see well and so does not know if she has had blood in her stools, but she has noticed it was darker this past month than usual.  Pt admitted with CHF and ABLA with GIB.   Case discussed with RN prior to visit, who reports pt with decreased intake today, however, has been on a clear liquid diet secondary to SB capsule study.   Spoke with pt at bedside, who is in good spirits today. She reports she always has a good appetite, consuming 3 meals per day (Breakfast: coffee, 2 slices of wheat toast, eggs, bacon, and orange juice; Lunch: homemade lentil soup; Dinner: two egg rolls).   Pt denies any weight loss. Noted wt has been  stable over the past 6 months.   Pt reports she has had ongoing DM foot wound and she was seeking outpatient podiatry assistance for this. Discussed with pt importance of continued good glycemic control to support healing.   Last Hgb A1c: 6.0 (11/04/17).  PTA DM medications are 0-5 units insulin regular BID and 10 units insulin NPH q HS and 15 units insulin NPH every morning. Spoke with pt regarding home DM control. She reports that when she was last in the hospital, she saw the DM coordinator who adjusted her insulin regimen. Since insulin regimen was adjusted, pt reports her blood sugars "imrproved, just like magic". She checks CBGS 3 times per day, readings running between 90-150. She has a glucometer which she uses a magnifying glass to help read (pt reports poor vision).   Labs reviewed: Na: 132, CBGS: 117-133 (inpatient orders for glycemic control are 0-15 units insulin aspart TID with meals, 0-5 units insulin aspart q HS, 10 units insulin NPH q HS, and 15 units insulin NPH q HS).   NUTRITION - FOCUSED PHYSICAL EXAM:    Most Recent Value  Orbital Region  No depletion  Upper Arm Region  No depletion  Thoracic and Lumbar Region  No depletion  Buccal Region  No depletion  Temple Region  No depletion  Clavicle Bone Region  No depletion  Clavicle and Acromion Bone Region  No depletion  Scapular Bone Region  No depletion  Dorsal Hand  No depletion  Patellar Region  No depletion  Anterior Thigh Region  No depletion  Posterior Calf Region  No depletion  Edema (RD Assessment)  Mild  Hair  Reviewed  Eyes  Reviewed  Mouth  Reviewed  Skin  Reviewed  Nails  Reviewed       Diet Order:   Diet Order            Diet heart healthy/carb modified Room service appropriate? Yes; Fluid consistency: Thin  Diet effective now        Diet clear liquid Room service appropriate? Yes; Fluid consistency: Thin  Diet effective now              EDUCATION NEEDS:   Education needs have been  addressed  Skin:  Skin Assessment: Skin Integrity Issues: Skin Integrity Issues:: Diabetic Ulcer Diabetic Ulcer: rt 5th toe neuropathic ulcer on metatarsal head  Last BM:  03/12/18  Height:   Ht Readings from Last 1 Encounters:  03/13/18 5\' 6"  (1.676 m)    Weight:   Wt Readings from Last 1 Encounters:  03/14/18 85.4 kg    Ideal Body Weight:  59.1 kg  BMI:  Body mass index is 30.38 kg/m.  Estimated Nutritional Needs:   Kcal:  1600-1800  Protein:  80-95 grams  Fluid:  >1.6 L    Halden Phegley A. Jimmye Norman, RD, LDN, CDE Pager: [phone] After hours Pager: [phone]

## 2018-03-14 NOTE — Progress Notes (Signed)
[place] for heparin Indication: atrial fibrillation s/p TAVR   Allergies  Allergen Reactions  . Morphine And Related Nausea And Vomiting and Other (See Comments)    Chest pain, also   . Lisinopril Cough  . Zoloft [Sertraline Hcl] Nausea Only         Patient Measurements: Height: 5\' 6"  (167.6 cm) Weight: 188 lb 3.2 oz (85.4 kg)(scale b) IBW/kg (Calculated) : 59.3 Heparin Dosing Weight: 77 kg  Vital Signs: Temp: 98.3 F (36.8 C) (11/26 0418) Temp Source: Oral (11/26 0418) BP: 136/55 (11/26 1058) Pulse Rate: 96 (11/26 1057)  Labs: Recent Labs    03/13/18 1114 03/13/18 1834 03/14/18 0019 03/14/18 0555 03/14/18 0949  HGB 6.7* 7.1*  --  9.6*  --   HCT 22.9* 23.4*  --  30.8*  --   PLT 253 286  --  276  --   HEPARINUNFRC  --   --  0.40  --  0.38  CREATININE 1.69*  --   --  1.57*  --   TROPONINI 0.05*  --   --   --   --     Estimated Creatinine Clearance: 31.4 mL/min (A) (by C-G formula based on SCr of 1.57 mg/dL (H)).  Assessment: 82 y.o. female with Afib for heparin - on apixaban PTA (last dose 11/24).   Confirmatory heparin level came back therapeutic at 0.38, on 1000 units/hr. Hgb 6.7 up to 9.6, plt 276- of note, received 1 PRBCs. No s/sx of bleeding. No infusion issues.   Goal of Therapy:  Heparin level 0.3-0.5 units/ml Monitor platelets by anticoagulation protocol: Yes   Plan:  Continue heparin infusion at 1000 units/hr Monitor daily heparin level, CBC, and for s/sx of bleeding   Antonietta Jewel, PharmD Clinical Pharmacist  Pager: [phone] Phone: [phone]

## 2018-03-15 ENCOUNTER — Telehealth (HOSPITAL_COMMUNITY): Payer: Self-pay

## 2018-03-15 DIAGNOSIS — Z952 Presence of prosthetic heart valve: Secondary | ICD-10-CM

## 2018-03-15 DIAGNOSIS — I509 Heart failure, unspecified: Secondary | ICD-10-CM

## 2018-03-15 DIAGNOSIS — N183 Chronic kidney disease, stage 3 (moderate): Secondary | ICD-10-CM

## 2018-03-15 DIAGNOSIS — K922 Gastrointestinal hemorrhage, unspecified: Secondary | ICD-10-CM

## 2018-03-15 LAB — CBC WITH DIFFERENTIAL/PLATELET
Abs Immature Granulocytes: 0.04 10*3/uL (ref 0.00–0.07)
Abs Immature Granulocytes: 0.04 10*3/uL (ref 0.00–0.07)
BASOS ABS: 0.1 10*3/uL (ref 0.0–0.1)
BASOS PCT: 0 %
Basophils Absolute: 0 10*3/uL (ref 0.0–0.1)
Basophils Relative: 1 %
EOS ABS: 0.5 10*3/uL (ref 0.0–0.5)
EOS PCT: 5 %
Eosinophils Absolute: 0.5 10*3/uL (ref 0.0–0.5)
Eosinophils Relative: 5 %
HCT: 30.3 % — ABNORMAL LOW (ref 36.0–46.0)
HCT: 29.7 % — ABNORMAL LOW (ref 36.0–46.0)
Hemoglobin: 9.3 g/dL — ABNORMAL LOW (ref 12.0–15.0)
Hemoglobin: 9.2 g/dL — ABNORMAL LOW (ref 12.0–15.0)
IMMATURE GRANULOCYTES: 0 %
IMMATURE GRANULOCYTES: 0 %
LYMPHS ABS: 0.7 10*3/uL (ref 0.7–4.0)
Lymphocytes Relative: 7 %
Lymphocytes Relative: 8 %
Lymphs Abs: 0.7 10*3/uL (ref 0.7–4.0)
MCH: 26.2 pg (ref 26.0–34.0)
MCH: 26.4 pg (ref 26.0–34.0)
MCHC: 30.7 g/dL (ref 30.0–36.0)
MCHC: 31 g/dL (ref 30.0–36.0)
MCV: 85.4 fL (ref 80.0–100.0)
MCV: 85.1 fL (ref 80.0–100.0)
Monocytes Absolute: 0.8 10*3/uL (ref 0.1–1.0)
Monocytes Absolute: 0.9 10*3/uL (ref 0.1–1.0)
Monocytes Relative: 7 %
Monocytes Relative: 9 %
NEUTROS PCT: 80 %
NEUTROS PCT: 78 %
NRBC: 0 % (ref 0.0–0.2)
NRBC: 0 % (ref 0.0–0.2)
Neutro Abs: 8.7 10*3/uL — ABNORMAL HIGH (ref 1.7–7.7)
Neutro Abs: 7.4 10*3/uL (ref 1.7–7.7)
PLATELETS: 273 10*3/uL (ref 150–400)
PLATELETS: 265 10*3/uL (ref 150–400)
RBC: 3.55 MIL/uL — AB (ref 3.87–5.11)
RBC: 3.49 MIL/uL — AB (ref 3.87–5.11)
RDW: 17.5 % — ABNORMAL HIGH (ref 11.5–15.5)
RDW: 17.4 % — AB (ref 11.5–15.5)
WBC: 10.8 10*3/uL — AB (ref 4.0–10.5)
WBC: 9.5 10*3/uL (ref 4.0–10.5)

## 2018-03-15 LAB — BASIC METABOLIC PANEL
ANION GAP: 8 (ref 5–15)
BUN: 49 mg/dL — ABNORMAL HIGH (ref 8–23)
CO2: 18 mmol/L — AB (ref 22–32)
Calcium: 7.9 mg/dL — ABNORMAL LOW (ref 8.9–10.3)
Chloride: 106 mmol/L (ref 98–111)
Creatinine, Ser: 2.01 mg/dL — ABNORMAL HIGH (ref 0.44–1.00)
GFR, EST AFRICAN AMERICAN: 26 mL/min — AB (ref >60)
GFR, EST NON AFRICAN AMERICAN: 23 mL/min — AB (ref >60)
GLUCOSE: 241 mg/dL — AB (ref 70–99)
POTASSIUM: 3.7 mmol/L (ref 3.5–5.1)
SODIUM: 132 mmol/L — AB (ref 135–145)

## 2018-03-15 LAB — BPAM RBC
BLOOD PRODUCT EXPIRATION DATE: 201912202359
Blood Product Expiration Date: 201912202359
ISSUE DATE / TIME: 201911251944
ISSUE DATE / TIME: 201911260121
UNIT TYPE AND RH: 6200
Unit Type and Rh: 6200

## 2018-03-15 LAB — GLUCOSE, CAPILLARY
Glucose-Capillary: 206 mg/dL — ABNORMAL HIGH (ref 70–99)
Glucose-Capillary: 355 mg/dL — ABNORMAL HIGH (ref 70–99)
Glucose-Capillary: 156 mg/dL — ABNORMAL HIGH (ref 70–99)
Glucose-Capillary: 218 mg/dL — ABNORMAL HIGH (ref 70–99)

## 2018-03-15 LAB — TYPE AND SCREEN
ABO/RH(D): A POS
ANTIBODY SCREEN: NEGATIVE
UNIT DIVISION: 0
UNIT DIVISION: 0

## 2018-03-15 LAB — HEPARIN LEVEL (UNFRACTIONATED): Heparin Unfractionated: 0.37 IU/mL (ref 0.30–0.70)

## 2018-03-15 MED ORDER — FUROSEMIDE 10 MG/ML IJ SOLN
40.0000 mg | Freq: Once | INTRAMUSCULAR | Status: AC
  Administered 2018-03-15: 40 mg via INTRAVENOUS
  Filled 2018-03-15: qty 4

## 2018-03-15 MED ORDER — FUROSEMIDE 10 MG/ML IJ SOLN
40.0000 mg | Freq: Every day | INTRAMUSCULAR | Status: DC
  Administered 2018-03-16: 40 mg via INTRAVENOUS
  Filled 2018-03-15: qty 4

## 2018-03-15 NOTE — Progress Notes (Addendum)
Slight decline in hemoglobin with moderate rise in BUN compared to yesterday, possibly indicating recurrent bleeding.  However, pt repts that her 2 stools today were brown in color.  Patient's small bowel capsule endoscopy study is normal, specifically without any source of bleeding identified.  Recomm:  1. [place] for dischg tomorrow IF hgb stable. 2. Have asked pt to f/u w/ me in the office in about 2 wks to check hgb and hemoccult--we will call her w/ an appt but she knows to contact us if she doesn't hear from [place] (gave her my card) 3. Depending on what her hgb and hemoccults show as outpt, could consider repeat colonoscopy (last done 8 yrs ago) and perhaps concurrent repeat egd (last done 2 mos ago).  Cleotis Nipper, M.D. Pager [phone] If no answer or after 5 PM call [phone]

## 2018-03-15 NOTE — Progress Notes (Signed)
PROGRESS NOTE    Mikela Senn  PTW:[id number] DOB: 08-25-1935 DOA: 03/13/2018 PCP: [facility], [facility]    Brief Narrative:  82 year old female who presented with dyspnea.  She does have significant past medical history for type 2 diabetes mellitus, peripheral vascular disease, atrial fibrillation, hypertension, dyslipidemia, diastolic heart failure, chronic kidney disease, coronary artery disease status post bypass grafting, aortic stenosis status post TAVR in July 2019.  Recent hospitalizations in 2 occasions 08/20019 for MRSA bacteremia and 12/2017 acute anemia requiring 3 units of packed red blood cell transfusion.  Reporting worsening dyspnea for the last 7 days, worse with exertion, associated with orthopnea and PND along with lower extremity edema.  On the initial physical examination blood pressure 165/66, heart rate 88, respirate 25, oxygen saturation 99%, heart S1-S2 present irregularly irregular, lungs with bibasilar rales, abdomen soft nontender, no lower extremity edema.  Sodium 131, potassium 3.6, chloride 108, bicarb 16, glucose 138, BUN 40, creatinine 1.69, BNP 2601, white count 9.2, hemoglobin 6.7, hematocrit 22.9, platelets 253.  Chest radiograph with increased interstitial markings bilaterally consistent with pulmonary edema.  EKG atrial fibrillation, normal axis, intraventricular conduction delay.  Patient was admitted to the hospital working diagnosis of acute decompensation of chronic diastolic heart failure complicated by acute symptomatic anemia.   Assessment & Plan:   Principal Problem:   Acute on chronic systolic CHF (congestive heart failure) (HCC) Active Problems:   Paroxysmal atrial fibrillation   Diabetes mellitus type 2 with peripheral artery disease (HCC)   Hyperlipidemia   Essential hypertension   S/P TAVR (transcatheter aortic valve replacement)   CKD (chronic kidney disease), stage III (HCC)   Foot ulcer, right (HCC)   Acute blood loss anemia  1. Acute on  chronic diastolic heart failure. Aortic stenosis sp TAVR. Patient more symptomatic this am will give a total of 80 mg IV furosemide and follow on response. Since admission her fluid balance has been negative  1,007 ml. Continue medical therapy with losartan and metoprolol.  2. Acute symptomatic anemia, due to acute blood loss, due to lower GI bleed. sp 2 units pbrc transfusion. hb and hct have remain stable at 9,3 and 30,3. Pending capsule endoscopy results. Continue pantoprazole, holding on aspiring for now.  3. Chronic atrial fibrillation. Has remained well controlled with metoprolol, on IV heparin for now, depending on capsule endoscopy will define further anticoagulation.   4. AKI on CKD stage 3.  Continue hypervolemic, serum cr worsening up to 2,0 with K at 3,7, will follow on renal panel in am, for now will continue aggressive diuresis. Avoid hypotension and nephrotoxic medications.   5. T2DM. Insulin sliding scale for glucose cover and monitoring, continue basal insulin with NPH 15 U am and 10 U pm. Fasting glucose 241.   6. Right foot ulcer. No antibiotics indicated at this point.   7. HTN. Blood pressure control with amlodipine and metoprolol for blood pressure control.  8. Dyslipidemia. On atorvastatin.    DVT prophylaxis: scd   Code Status: dnr  Family Communication: no family at the bedside  Disposition Plan/ discharge barriers: pending clinical improvement  Body mass index is 29.33 kg/m. Malnutrition Type:  Nutrition Problem: Increased nutrient needs Etiology: wound healing   Malnutrition Characteristics:  Signs/Symptoms: estimated needs   Nutrition Interventions:  Interventions: MVI, Juven  RN Pressure Injury Documentation:     Consultants:   GI  Procedures:     Antimicrobials:       Subjective: Patient this am with worsening dyspnea, no chest pain,  no nausea or vomiting, positive diarrhea but no black stools.   Objective: Vitals:    03/14/18 2214 03/15/18 0132 03/15/18 0541 03/15/18 0619  BP: (!) 128/56 (!) 115/97 (!) 156/73   Pulse: 88 87 83   Resp:  20 (!) 22   Temp:  99.4 F (37.4 C) 98.4 F (36.9 C)   TempSrc:  Oral Oral   SpO2: 94% 97% 94%   Weight:    82.4 kg  Height:        Intake/Output Summary (Last 24 hours) at 03/15/2018 1007 Last data filed at 03/15/2018 0600 Gross per 24 hour  Intake 612.82 ml  Output 921 ml  Net -308.18 ml   Filed Weights   03/13/18 1713 03/14/18 0249 03/15/18 0619  Weight: 83.6 kg 85.4 kg 82.4 kg    Examination:   General: Not in pain or dyspnea, deconditioned  Neurology: Awake and alert, non focal, slow speech.   E ENT: mild pallor, no icterus, oral mucosa moist Cardiovascular: No JVD. S1-S2 present, rhythmic, no gallops, rubs, or murmurs. Trace lower extremity edema. Pulmonary: positive breath sounds bilaterally,  no wheezing or rhonchi, positive bibasilar rales. Gastrointestinal. Abdomen with no organomegaly, non tender, no rebound or guarding Skin. No rashes Musculoskeletal: no joint deformities     Data Reviewed: I have personally reviewed following labs and imaging studies  CBC: Recent Labs  Lab 03/13/18 1114 03/13/18 1834 03/14/18 0555 03/14/18 1926 03/15/18 0548  WBC 9.2 10.2 11.9* 11.2* 10.8*  NEUTROABS 7.5 8.4* 9.7* 9.5* 8.7*  HGB 6.7* 7.1* 9.6* 9.7* 9.3*  HCT 22.9* 23.4* 30.8* 31.6* 30.3*  MCV 88.1 86.7 85.6 84.5 85.4  PLT 253 286 276 287 951   Basic Metabolic Panel: Recent Labs  Lab 03/13/18 1114 03/14/18 0555 03/15/18 0548  NA 131* 132* 132*  K 3.6 4.8 3.7  CL 108 105 106  CO2 16* 21* 18*  GLUCOSE 138* 141* 241*  BUN 40* 35* 49*  CREATININE 1.69* 1.57* 2.01*  CALCIUM 8.2* 8.2* 7.9*   GFR: Estimated Creatinine Clearance: 24.1 mL/min (A) (by C-G formula based on SCr of 2.01 mg/dL (H)). Liver Function Tests: Recent Labs  Lab 03/13/18 1114  AST 43*  ALT 29  ALKPHOS 103  BILITOT 0.9  PROT 7.2  ALBUMIN 1.8*   No results for  input(s): LIPASE, AMYLASE in the last 168 hours. No results for input(s): AMMONIA in the last 168 hours. Coagulation Profile: No results for input(s): INR, PROTIME in the last 168 hours. Cardiac Enzymes: Recent Labs  Lab 03/13/18 1114  TROPONINI 0.05*   BNP (last 3 results) No results for input(s): PROBNP in the last 8760 hours. HbA1C: No results for input(s): HGBA1C in the last 72 hours. CBG: Recent Labs  Lab 03/14/18 0727 03/14/18 1138 03/14/18 1657 03/14/18 2059 03/15/18 0721  GLUCAP 133* 117* 164* 278* 206*   Lipid Profile: No results for input(s): CHOL, HDL, LDLCALC, TRIG, CHOLHDL, LDLDIRECT in the last 72 hours. Thyroid Function Tests: No results for input(s): TSH, T4TOTAL, FREET4, T3FREE, THYROIDAB in the last 72 hours. Anemia Panel: No results for input(s): VITAMINB12, FOLATE, FERRITIN, TIBC, IRON, RETICCTPCT in the last 72 hours.    Radiology Studies: I have reviewed all of the imaging during this hospital visit personally     Scheduled Meds: . amLODipine  5 mg Oral Daily  . atorvastatin  80 mg Oral q1800  . Chlorhexidine Gluconate Cloth  6 each Topical Q0600  . feeding supplement  1 Container Oral TID BM  .  furosemide  40 mg Intravenous Q12H  . hydrocerin   Topical BID  . insulin aspart  0-15 Units Subcutaneous TID WC  . insulin aspart  0-5 Units Subcutaneous QHS  . insulin NPH Human  10 Units Subcutaneous QHS  . insulin NPH Human  15 Units Subcutaneous QAC breakfast  . losartan  50 mg Oral Daily  . metoprolol tartrate  25 mg Oral BID  . multivitamin with minerals  1 tablet Oral Daily  . mupirocin ointment  1 application Nasal BID  . nutrition supplement (JUVEN)  1 packet Oral BID BM  . pantoprazole (PROTONIX) IV  40 mg Intravenous Q12H  . sodium chloride flush  3 mL Intravenous Q12H   Continuous Infusions: . sodium chloride    . heparin 1,000 Units/hr (03/15/18 0200)     LOS: 2 days        Viliami Bracco Gerome Apley, MD [facility] Pager [phone]

## 2018-03-15 NOTE — Progress Notes (Addendum)
[place] for heparin Indication: atrial fibrillation s/p TAVR   Allergies  Allergen Reactions  . Morphine And Related Nausea And Vomiting and Other (See Comments)    Chest pain, also   . Lisinopril Cough  . Zoloft [Sertraline Hcl] Nausea Only         Patient Measurements: Height: 5\' 6"  (167.6 cm) Weight: 181 lb 11.2 oz (82.4 kg)(scale b) IBW/kg (Calculated) : 59.3 Heparin Dosing Weight: 77 kg  Vital Signs: Temp: 98.4 F (36.9 C) (11/27 0541) Temp Source: Oral (11/27 0541) BP: 156/73 (11/27 0541) Pulse Rate: 83 (11/27 0541)  Labs: Recent Labs    03/13/18 1114  03/14/18 0019 03/14/18 0555 03/14/18 0949 03/14/18 1926 03/15/18 0548  HGB 6.7*   < >  --  9.6*  --  9.7* 9.3*  HCT 22.9*   < >  --  30.8*  --  31.6* 30.3*  PLT 253   < >  --  276  --  287 273  HEPARINUNFRC  --   --  0.40  --  0.38  --  0.37  CREATININE 1.69*  --   --  1.57*  --   --  2.01*  TROPONINI 0.05*  --   --   --   --   --   --    < > = values in this interval not displayed.    Estimated Creatinine Clearance: 24.1 mL/min (A) (by C-G formula based on SCr of 2.01 mg/dL (H)).  Assessment: 82 y.o. female with Afib for heparin - on apixaban PTA (last dose 11/24).   Confirmatory heparin level came back therapeutic at 0.37, on 1000 units/hr. Hgb 9.3, plt 273- of note, received 2 PRBCs 11/26. No s/sx of bleeding. No infusion issues.   Goal of Therapy:  Heparin level 0.3-0.5 units/ml Monitor platelets by anticoagulation protocol: Yes   Plan:  Continue heparin infusion at 1000 units/hr Monitor daily heparin level, CBC, and for s/sx of bleeding   Antonietta Jewel, PharmD Clinical Pharmacist  Pager: [phone] Phone: [phone]

## 2018-03-15 NOTE — Progress Notes (Signed)
Physical Therapy Treatment Patient Details Name: Kristina Summers MRN: [id number] DOB: July 31, 1935 Today's Date: 03/15/2018    History of Present Illness Pt is an 82 y.o. female admitted 03/13/18 with SOB. Worked up for CHF exacerbation, complicated by low Hgb and heme positive stool. Pt underwent capsule endoscopy 11/26. PMH includes CHF, CKD, HTN, CVA (2016), DM2, s/p CABG, s/p  TAVR (2019), PVD, fibromyalgia, R foot ulcer.    PT Comments    Pt progressing well with mobility. Ambulatory with [facility] and supervision for safety. Continues to have difficulty speaking in complete sentences secondary to SOB at rest. SpO2 >90% on RA with mobility. Pt motivated to return home. Will continue to follow acutely.   Follow Up Recommendations  Home health PT     Equipment Recommendations  None recommended by PT    Recommendations for Other Services       Precautions / Restrictions Precautions Precautions: Fall Restrictions Weight Bearing Restrictions: No    Mobility  Bed Mobility Overal bed mobility: Modified Independent             General bed mobility comments: HOB elevated  Transfers Overall transfer level: Modified independent Equipment used: Straight cane                Ambulation/Gait Ambulation/Gait assistance: Supervision Gait Distance (Feet): 400 Feet Assistive device: Straight cane Gait Pattern/deviations: Step-through pattern;Decreased stride length;Trunk flexed Gait velocity: Decreased Gait velocity interpretation: 1.31 - 2.62 ft/sec, indicative of limited community ambulator General Gait Details: Slow, steady amb with SPC and supervision for safety. Pt intermittently veers L/R able to self-correct without cues. Unable to speak complete sentences while walking due to DOE. SpO2 >90% on RA   Stairs             Wheelchair Mobility    Modified Rankin (Stroke Patients Only)       Balance Overall balance assessment: Needs assistance   Sitting  balance-Leahy Scale: Good       Standing balance-Leahy Scale: Fair Standing balance comment: Amb in room without DME support, intermittently reaching for furniture                            Cognition Arousal/Alertness: Awake/alert Behavior During Therapy: WFL for tasks assessed/performed Overall Cognitive Status: Within Functional Limits for tasks assessed                                        Exercises      General Comments General comments (skin integrity, edema, etc.): SOB seems better at rest, but pt still with difficulty speaking in complete sentences due to SOB at rest      Pertinent Vitals/Pain Pain Assessment: Faces Faces Pain Scale: Hurts a little bit Pain Location: Back Pain Descriptors / Indicators: Discomfort;Grimacing Pain Intervention(s): Repositioned    Home Living                      Prior Function            PT Goals (current goals can now be found in the care plan section) Acute Rehab PT Goals Patient Stated Goal: Return home PT Goal Formulation: With patient Time For Goal Achievement: 03/28/18 Potential to Achieve Goals: Good Progress towards PT goals: Progressing toward goals    Frequency    Min 3X/week      PT Plan  Current plan remains appropriate    Co-evaluation              AM-PAC PT "6 Clicks" Mobility   Outcome Measure  Help needed turning from your back to your side while in a flat bed without using bedrails?: None Help needed moving from lying on your back to sitting on the side of a flat bed without using bedrails?: None Help needed moving to and from a bed to a chair (including a wheelchair)?: None Help needed standing up from a chair using your arms (e.g., wheelchair or bedside chair)?: None Help needed to walk in hospital room?: A Little Help needed climbing 3-5 steps with a railing? : A Little 6 Click Score: 22    End of Session Equipment Utilized During Treatment: Gait  belt Activity Tolerance: Patient tolerated treatment well Patient left: in chair;with call bell/phone within reach;with chair alarm set Nurse Communication: Mobility status PT Visit Diagnosis: Other abnormalities of gait and mobility (R26.89)     Time: 8341-9622 PT Time Calculation (min) (ACUTE ONLY): 21 min  Charges:  $Gait Training: 8-22 mins                    Mabeline Caras, PT, DPT Acute Rehabilitation Services  Pager [phone] Office [phone]  Derry Lory 03/15/2018, 9:15 AM

## 2018-03-15 NOTE — Progress Notes (Signed)
   03/15/18 1500  Clinical Encounter Type  Visited With Patient  Visit Type Initial  Referral From Nurse  Consult/Referral To Chaplain  Spiritual Encounters  Spiritual Needs Prayer  Stress Factors  Patient Stress Factors None identified  Responded to spiritual care consult. PT was alert and sitting in chair. PT was open to talk and elated for the [place] visit. I offered spiritual care with words of encouragement, a listening ear and prayer. Chaplain available as needed.  Chaplain Fidel Levy  [phone]

## 2018-03-16 LAB — GLUCOSE, CAPILLARY
Glucose-Capillary: 141 mg/dL — ABNORMAL HIGH (ref 70–99)
Glucose-Capillary: 195 mg/dL — ABNORMAL HIGH (ref 70–99)
Glucose-Capillary: 254 mg/dL — ABNORMAL HIGH (ref 70–99)
Glucose-Capillary: 162 mg/dL — ABNORMAL HIGH (ref 70–99)

## 2018-03-16 LAB — BASIC METABOLIC PANEL
Anion gap: 8 (ref 5–15)
BUN: 63 mg/dL — AB (ref 8–23)
CHLORIDE: 107 mmol/L (ref 98–111)
CO2: 16 mmol/L — ABNORMAL LOW (ref 22–32)
Calcium: 7.9 mg/dL — ABNORMAL LOW (ref 8.9–10.3)
Creatinine, Ser: 2.26 mg/dL — ABNORMAL HIGH (ref 0.44–1.00)
GFR calc Af Amer: 23 mL/min — ABNORMAL LOW (ref >60)
GFR, EST NON AFRICAN AMERICAN: 20 mL/min — AB (ref >60)
GLUCOSE: 161 mg/dL — AB (ref 70–99)
POTASSIUM: 3.8 mmol/L (ref 3.5–5.1)
Sodium: 131 mmol/L — ABNORMAL LOW (ref 135–145)

## 2018-03-16 LAB — HEPARIN LEVEL (UNFRACTIONATED): Heparin Unfractionated: 0.38 IU/mL (ref 0.30–0.70)

## 2018-03-16 MED ORDER — ASPIRIN EC 81 MG PO TBEC
81.0000 mg | DELAYED_RELEASE_TABLET | Freq: Every day | ORAL | Status: DC
  Administered 2018-03-16 – 2018-03-17 (×2): 81 mg via ORAL
  Filled 2018-03-16 (×2): qty 1

## 2018-03-16 NOTE — Progress Notes (Signed)
PROGRESS NOTE    Kristina Summers  GUY:[id number] DOB: [birth year] DOA: 03/13/2018 PCP: [facility]    Brief Narrative:  82 year old female who presented with dyspnea. She does have significant past medical history for type 2 diabetes mellitus, peripheral vascular disease, atrial fibrillation, hypertension, dyslipidemia, diastolic heart failure, chronic kidney disease, coronary artery disease status post bypass grafting, aortic stenosis status post TAVR in July 2019.Recent hospitalizations in 2 occasions 08/20019for MRSA bacteremia and09/2019acute anemia requiring 3 units of packed red blood cell transfusion. Reporting worsening dyspnea for the last 7 days, worse with exertion, associated with orthopnea and PND along with lower extremity edema. On the initial physical examination blood pressure 165/66, heart rate 88, respirate 25, oxygen saturation 99%, heart S1-S2 present irregularly irregular, lungs with bibasilar rales, abdomen soft nontender, no lower extremity edema. Sodium 131, potassium 3.6, chloride 108, bicarb 16, glucose 138, BUN 40, creatinine 1.69, BNP 2601, white count 9.2, hemoglobin 6.7, hematocrit 22.9, platelets 253.Chest radiograph with increased interstitial markings bilaterally consistent with pulmonary edema.EKG atrial fibrillation, normal axis, intraventricular conduction delay.  Patient was admitted to the hospital working diagnosis of acute decompensation of chronic diastolic heart failure complicated by acute symptomatic anemia.    Assessment & Plan:   Principal Problem:   Acute on chronic systolic CHF (congestive heart failure) (HCC) Active Problems:   Paroxysmal atrial fibrillation   Diabetes mellitus type 2 with peripheral artery disease (HCC)   Hyperlipidemia   Acute on chronic congestive heart failure (HCC)   Essential hypertension   S/P TAVR (transcatheter aortic valve replacement)   CKD (chronic kidney disease), stage III (HCC)   Foot ulcer,  right (HCC)   Acute blood loss anemia   Gastrointestinal hemorrhage  1. Acute on chronic diastolic heart failure.Aortic stenosis sp TAVR. Improved dyspnea, will hold on further diuresis in the setting of worsening renal function. Old records personally reviewed, patient on aspirin for TAVR and apixaban for atrial fibrillation. Will hold on heparin for now and will wait for capsule results before resuming apixaban. For now will resume asa.   2. Acute symptomatic anemia, due to acute blood loss, due to lower GI bleed. sp 2 units pbrc transfusion. No further signs of bleeding, patient on heparin drip, will continue PPI. Follow on capsule endoscopy results.    3. Chronic atrial fibrillation. Continue metoprolol for rate control, for now will hold on heparin drip. Continue telemetry monitoring.   4. AKI on CKD stage 3. Improve volume status today, will hold on furosemide, serum cr is up to 2,2, with K at 3,8 and serum bicarbonate at 16, will follow on renal panel in am, avoid hypotension and nephrotoxic medications. Documented urine output 951 ml.   5. T2DM. Continue with insulin sliding scale for glucose cover and monitoring, continue basal insulin with NPH 15 U am and 10 U pm. Fasting glucose 161   6. Right foot ulcer. Local wound care.   7. HTN. Continue blood pressure control with amlodipine, losartan  and metoprolol. Systolic 563 to 875 mmHg.   8. Dyslipidemia. Continue with atorvastatin.  DVT prophylaxis:scd Code Status:dnr Family Communication:no family at the bedside Disposition Plan/ discharge barriers:pending capsule endoscopy and renal function improvement.     Body mass index is 29.78 kg/m. Malnutrition Type:  Nutrition Problem: Increased nutrient needs Etiology: wound healing   Malnutrition Characteristics:  Signs/Symptoms: estimated needs   Nutrition Interventions:  Interventions: MVI, Juven  RN Pressure Injury Documentation:      Consultants:   GI   Procedures:  Antimicrobials:       Subjective: Patient reports improved dyspnea, no nausea or vomiting, no chest pain, no further bleeding.   Objective: Vitals:   03/15/18 2048 03/15/18 2133 03/16/18 0503 03/16/18 0952  BP: (!) 149/57 (!) 162/61 119/87 (!) 138/50  Pulse: 81 83 82 78  Resp: 20  20 18   Temp: 98.4 F (36.9 C)  98.6 F (37 C) 98 F (36.7 C)  TempSrc: Oral  Oral Oral  SpO2: 98%  97% 96%  Weight:   83.7 kg   Height:        Intake/Output Summary (Last 24 hours) at 03/16/2018 1049 Last data filed at 03/16/2018 1005 Gross per 24 hour  Intake 486 ml  Output 951 ml  Net -465 ml   Filed Weights   03/14/18 0249 03/15/18 0619 03/16/18 0503  Weight: 85.4 kg 82.4 kg 83.7 kg    Examination:   General: Not in pain or dyspnea, deconditioned  Neurology: Awake and alert, non focal  E ENT: mild pallor, no icterus, oral mucosa moist Cardiovascular: No JVD. S1-S2 present, rhythmic, no gallops, rubs, or murmurs. No lower extremity edema. Pulmonary: positive breath sounds bilaterally, adequate air movement, no wheezing or rhonchi, scattered rales. Gastrointestinal. Abdomen with no organomegaly, non tender, no rebound or guarding Skin. No rashes Musculoskeletal: no joint deformities     Data Reviewed: I have personally reviewed following labs and imaging studies  CBC: Recent Labs  Lab 03/13/18 1834 03/14/18 0555 03/14/18 1926 03/15/18 0548 03/15/18 1905  WBC 10.2 11.9* 11.2* 10.8* 9.5  NEUTROABS 8.4* 9.7* 9.5* 8.7* 7.4  HGB 7.1* 9.6* 9.7* 9.3* 9.2*  HCT 23.4* 30.8* 31.6* 30.3* 29.7*  MCV 86.7 85.6 84.5 85.4 85.1  PLT 286 276 287 273 683   Basic Metabolic Panel: Recent Labs  Lab 03/13/18 1114 03/14/18 0555 03/15/18 0548 03/16/18 0543  NA 131* 132* 132* 131*  K 3.6 4.8 3.7 3.8  CL 108 105 106 107  CO2 16* 21* 18* 16*  GLUCOSE 138* 141* 241* 161*  BUN 40* 35* 49* 63*  CREATININE 1.69* 1.57* 2.01* 2.26*  CALCIUM  8.2* 8.2* 7.9* 7.9*   GFR: Estimated Creatinine Clearance: 21.7 mL/min (A) (by C-G formula based on SCr of 2.26 mg/dL (H)). Liver Function Tests: Recent Labs  Lab 03/13/18 1114  AST 43*  ALT 29  ALKPHOS 103  BILITOT 0.9  PROT 7.2  ALBUMIN 1.8*   No results for input(s): LIPASE, AMYLASE in the last 168 hours. No results for input(s): AMMONIA in the last 168 hours. Coagulation Profile: No results for input(s): INR, PROTIME in the last 168 hours. Cardiac Enzymes: Recent Labs  Lab 03/13/18 1114  TROPONINI 0.05*   BNP (last 3 results) No results for input(s): PROBNP in the last 8760 hours. HbA1C: No results for input(s): HGBA1C in the last 72 hours. CBG: Recent Labs  Lab 03/15/18 0721 03/15/18 1129 03/15/18 1634 03/15/18 2120 03/16/18 0737  GLUCAP 206* 355* 156* 218* 141*   Lipid Profile: No results for input(s): CHOL, HDL, LDLCALC, TRIG, CHOLHDL, LDLDIRECT in the last 72 hours. Thyroid Function Tests: No results for input(s): TSH, T4TOTAL, FREET4, T3FREE, THYROIDAB in the last 72 hours. Anemia Panel: No results for input(s): VITAMINB12, FOLATE, FERRITIN, TIBC, IRON, RETICCTPCT in the last 72 hours.    Radiology Studies: I have reviewed all of the imaging during this hospital visit personally     Scheduled Meds: . amLODipine  5 mg Oral Daily  . atorvastatin  80 mg Oral q1800  .  Chlorhexidine Gluconate Cloth  6 each Topical Q0600  . feeding supplement  1 Container Oral TID BM  . hydrocerin   Topical BID  . insulin aspart  0-15 Units Subcutaneous TID WC  . insulin aspart  0-5 Units Subcutaneous QHS  . insulin NPH Human  10 Units Subcutaneous QHS  . insulin NPH Human  15 Units Subcutaneous QAC breakfast  . losartan  50 mg Oral Daily  . metoprolol tartrate  25 mg Oral BID  . multivitamin with minerals  1 tablet Oral Daily  . mupirocin ointment  1 application Nasal BID  . nutrition supplement (JUVEN)  1 packet Oral BID BM  . pantoprazole (PROTONIX) IV  40 mg  Intravenous Q12H  . sodium chloride flush  3 mL Intravenous Q12H   Continuous Infusions: . sodium chloride    . heparin 1,000 Units/hr (03/16/18 0012)     LOS: 3 days        Mauricio Gerome Apley, MD [facility] Pager [phone]

## 2018-03-16 NOTE — Plan of Care (Signed)

## 2018-03-16 NOTE — Progress Notes (Signed)
[place] for heparin Indication: atrial fibrillation s/p TAVR   Allergies  Allergen Reactions  . Morphine And Related Nausea And Vomiting and Other (See Comments)    Chest pain, also   . Lisinopril Cough  . Zoloft [Sertraline Hcl] Nausea Only         Patient Measurements: Height: 5\' 6"  (167.6 cm) Weight: 184 lb 8 oz (83.7 kg) IBW/kg (Calculated) : 59.3 Heparin Dosing Weight: 77 kg  Vital Signs: Temp: 98.6 F (37 C) (11/28 0503) Temp Source: Oral (11/28 0503) BP: 119/87 (11/28 0503) Pulse Rate: 82 (11/28 0503)  Labs: Recent Labs    03/13/18 1114  03/14/18 0555 03/14/18 0949 03/14/18 1926 03/15/18 0548 03/15/18 1905 03/16/18 0543  HGB 6.7*   < > 9.6*  --  9.7* 9.3* 9.2*  --   HCT 22.9*   < > 30.8*  --  31.6* 30.3* 29.7*  --   PLT 253   < > 276  --  287 273 265  --   HEPARINUNFRC  --    < >  --  0.38  --  0.37  --  0.38  CREATININE 1.69*  --  1.57*  --   --  2.01*  --  2.26*  TROPONINI 0.05*  --   --   --   --   --   --   --    < > = values in this interval not displayed.    Estimated Creatinine Clearance: 21.7 mL/min (A) (by C-G formula based on SCr of 2.26 mg/dL (H)).  Assessment: 82 y.o. female with Afib for heparin - on apixaban PTA (last dose 11/24).   Heparin level remains therapeutic at 0.38, on 1000 units/hr. Hgb 9.2, plt 265- of note, received 2 PRBCs 11/26. No s/sx of bleeding. No infusion issues.   Goal of Therapy:  Heparin level 0.3-0.5 units/ml Monitor platelets by anticoagulation protocol: Yes   Plan:  Continue heparin infusion at 1000 units/hr Monitor daily heparin level, CBC, and for s/sx of bleeding   Vertis Kelch, PharmD PGY1 Pharmacy Resident Phone [phone] 03/16/2018       7:36 AM

## 2018-03-16 NOTE — Progress Notes (Signed)
Pt refuses to wear ulna boots at night

## 2018-03-17 LAB — BASIC METABOLIC PANEL
Anion gap: 5 (ref 5–15)
BUN: 79 mg/dL — ABNORMAL HIGH (ref 8–23)
CO2: 20 mmol/L — ABNORMAL LOW (ref 22–32)
Calcium: 8.3 mg/dL — ABNORMAL LOW (ref 8.9–10.3)
Chloride: 107 mmol/L (ref 98–111)
Creatinine, Ser: 2.69 mg/dL — ABNORMAL HIGH (ref 0.44–1.00)
GFR calc Af Amer: 19 mL/min — ABNORMAL LOW (ref >60)
GFR calc non Af Amer: 16 mL/min — ABNORMAL LOW (ref >60)
Glucose, Bld: 238 mg/dL — ABNORMAL HIGH (ref 70–99)
Potassium: 4.2 mmol/L (ref 3.5–5.1)
Sodium: 132 mmol/L — ABNORMAL LOW (ref 135–145)

## 2018-03-17 LAB — HEMOGLOBIN AND HEMATOCRIT, BLOOD
HCT: 26.7 % — ABNORMAL LOW (ref 36.0–46.0)
Hemoglobin: 7.9 g/dL — ABNORMAL LOW (ref 12.0–15.0)

## 2018-03-17 LAB — GLUCOSE, CAPILLARY
GLUCOSE-CAPILLARY: 203 mg/dL — AB (ref 70–99)
Glucose-Capillary: 203 mg/dL — ABNORMAL HIGH (ref 70–99)
Glucose-Capillary: 225 mg/dL — ABNORMAL HIGH (ref 70–99)
Glucose-Capillary: 184 mg/dL — ABNORMAL HIGH (ref 70–99)

## 2018-03-17 MED ORDER — APIXABAN 2.5 MG PO TABS
2.5000 mg | ORAL_TABLET | Freq: Two times a day (BID) | ORAL | Status: DC
  Administered 2018-03-17 – 2018-03-18 (×3): 2.5 mg via ORAL
  Filled 2018-03-17 (×3): qty 1

## 2018-03-17 NOTE — Progress Notes (Signed)
Physical Therapy Treatment Patient Details Name: Kristina Summers MRN: [id number] DOB: 09/27/1935 Today's Date: 03/17/2018    History of Present Illness Pt is an 82 y.o. female admitted 03/13/18 with SOB. Worked up for CHF exacerbation, complicated by low Hgb and heme positive stool. Pt underwent capsule endoscopy 11/26. PMH includes CHF, CKD, HTN, CVA (2016), DM2, s/p CABG, s/p  TAVR (2019), PVD, fibromyalgia, R foot ulcer.    PT Comments    Patient seen for mobility progression. Patient received in restroom with PT providing education to call for staff to ensure safety with transfers and mobility. Patient utilizing [facility] for gait with cueing for safety and obstacle navigation. Patient making good progress towards goals. PT to continue to follow.     Follow Up Recommendations  Home health PT     Equipment Recommendations  None recommended by PT    Recommendations for Other Services       Precautions / Restrictions Precautions Precautions: Fall Restrictions Weight Bearing Restrictions: No    Mobility  Bed Mobility Overal bed mobility: Modified Independent             General bed mobility comments: received in restroom; sit to supine Mod I  Transfers Overall transfer level: Modified independent Equipment used: Straight cane                Ambulation/Gait Ambulation/Gait assistance: Min guard;Supervision Gait Distance (Feet): 350 Feet Assistive device: Straight cane Gait Pattern/deviations: Step-through pattern;Decreased stride length;Trunk flexed Gait velocity: Decreased   General Gait Details: slow, cautious gait pattern; cueing for safety and obstacle navigation   Stairs             Wheelchair Mobility    Modified Rankin (Stroke Patients Only)       Balance Overall balance assessment: Needs assistance Sitting-balance support: No upper extremity supported;Feet supported Sitting balance-Leahy Scale: Good     Standing balance support: Single  extremity supported;During functional activity Standing balance-Leahy Scale: Fair                              Cognition Arousal/Alertness: Awake/alert Behavior During Therapy: WFL for tasks assessed/performed Overall Cognitive Status: Within Functional Limits for tasks assessed                                        Exercises      General Comments General comments (skin integrity, edema, etc.): educated on need to call for nursing staff when needing to ambualte to restroom for safety purposes;       Pertinent Vitals/Pain Pain Assessment: No/denies pain    Home Living                      Prior Function            PT Goals (current goals can now be found in the care plan section) Acute Rehab PT Goals Patient Stated Goal: Return home PT Goal Formulation: With patient Time For Goal Achievement: 03/28/18 Potential to Achieve Goals: Good Progress towards PT goals: Progressing toward goals    Frequency    Min 3X/week      PT Plan Current plan remains appropriate    Co-evaluation              AM-PAC PT "6 Clicks" Mobility   Outcome Measure  Help needed turning from  your back to your side while in a flat bed without using bedrails?: None Help needed moving from lying on your back to sitting on the side of a flat bed without using bedrails?: None Help needed moving to and from a bed to a chair (including a wheelchair)?: None Help needed standing up from a chair using your arms (e.g., wheelchair or bedside chair)?: A Little Help needed to walk in hospital room?: A Little Help needed climbing 3-5 steps with a railing? : A Little 6 Click Score: 21    End of Session Equipment Utilized During Treatment: Gait belt Activity Tolerance: Patient tolerated treatment well Patient left: in bed;with call bell/phone within reach;with family/visitor present Nurse Communication: Mobility status PT Visit Diagnosis: Other abnormalities of  gait and mobility (R26.89)     Time: 1356-1420 PT Time Calculation (min) (ACUTE ONLY): 24 min  Charges:  $Gait Training: 8-22 mins $Therapeutic Activity: 8-22 mins                     Lanney Gins, PT, DPT Supplemental Physical Therapist 03/17/18 2:36 PM Pager: [phone] Office: [phone]

## 2018-03-17 NOTE — Progress Notes (Signed)
PROGRESS NOTE    Kristina Kristina  IOE:[id number] DOB: 29-Dec-1935 DOA: 03/13/2018 PCP: [facility]    Brief Narrative:  82 year old female who presented with dyspnea. She does have significant past medical history for type 2 diabetes mellitus, peripheral vascular disease, atrial fibrillation, hypertension, dyslipidemia, diastolic heart failure, chronic kidney disease, coronary artery disease status post bypass grafting, aortic stenosis status post TAVR in July 2019.Recent hospitalizations in 2 occasions 08/20019for MRSA bacteremia and09/2019acute anemia requiring 3 units of packed red blood cell transfusion. Reporting worsening dyspnea for the last 7 days, worse with exertion, associated with orthopnea and PND along with lower extremity edema. On the initial physical examination blood pressure 165/66, heart rate 88, respirate 25, oxygen saturation 99%, heart S1-S2 present irregularly irregular, lungs with bibasilar rales, abdomen soft nontender, no lower extremity edema. Sodium 131, potassium 3.6, chloride 108, bicarb 16, glucose 138, BUN 40, creatinine 1.69, BNP 2601, white count 9.2, hemoglobin 6.7, hematocrit 22.9, platelets 253.Chest radiograph with increased interstitial markings bilaterally consistent with pulmonary edema.EKG atrial fibrillation, normal axis, intraventricular conduction delay.  Patient was admitted to the hospital working diagnosis of acute decompensation of chronic diastolic heart failure complicated by acute symptomatic anemia.   Assessment & Plan:   Principal Problem:   Acute on chronic systolic CHF (congestive heart failure) (HCC) Active Problems:   Paroxysmal atrial fibrillation   Diabetes mellitus type 2 with peripheral artery disease (HCC)   Hyperlipidemia   Acute on chronic congestive heart failure (HCC)   Essential hypertension   Symptomatic anemia   S/P TAVR (transcatheter aortic valve replacement)   CKD (chronic kidney disease), stage III  (HCC)   Foot ulcer, right (HCC)   Acute blood loss anemia   Gastrointestinal hemorrhage   1. Acute on chronic diastolic heart failure.Aortic stenosis sp TAVR.dyspnea has improved, will continue to hold on diuresis, continue telemetry monitoring, will resume apoixaban, patient not on aspirin as outpatinet, aortic surgery on July 2019.    2. Acute symptomatic anemia, due to acute blood loss, due to lower GI bleed.sp2 units pbrc transfusion.No further signs of bleeding, but noted drop in hb and hct. Capsule endoscopy negative for acute bleeding, will continue pantoprazole for now and will follow on cell count in am.   3. Chronic atrial fibrillation.Rate control with metoprolol, anticoagulation with apixaban, no signs of active bleeding. Continue to hold on aspirin for now.   4. AKI on CKD stage 3.Worsening renal function per serum cr, today up to 2,69 from 2,26, with K at 4,2 and serum bicarbonate at 20, will continue to hold on diuresis and will follow up on renal function in am. Patient is tolerating po well.   5. T2DM.Glucose cover with insulin sliding scale, continue basal insulin with NPH 15 U am and 10 U pm.   6. Right foot ulcer.Local wound care. No signs of local infection.   7. HTN.Continue blood pressure control with amlodipine, losartan  and metoprolol.   8. Dyslipidemia.On atorvastatin with good toleration.  DVT prophylaxis:scd Code Status:dnr Family Communication:no family at the bedside Disposition Plan/ discharge barriers:pending recheck hb and hct, plus renal function in am.  Body mass index is 29.94 kg/m. Malnutrition Type:  Nutrition Problem: Increased nutrient needs Etiology: wound healing   Malnutrition Characteristics:  Signs/Symptoms: estimated needs   Nutrition Interventions:  Interventions: MVI, Juven  RN Pressure Injury Documentation:     Consultants:     Procedures:     Antimicrobials:        Subjective: Dyspnea has improved, no  nausea or vomiting, no hematochezia or melena. NO chest pain or palpitations. At home not taking aspirin.   Objective: Vitals:   03/16/18 1156 03/16/18 2033 03/17/18 0054 03/17/18 0333  BP: (!) 133/44 (!) 164/55 (!) 135/56 (!) 143/55  Pulse: 62 86 67 72  Resp: 18 18  18   Temp: 98.4 F (36.9 C) 98 F (36.7 C)  97.9 F (36.6 C)  TempSrc: Oral Oral  Oral  SpO2: 97% 97%  97%  Weight:    84.1 kg  Height:        Intake/Output Summary (Last 24 hours) at 03/17/2018 1052 Last data filed at 03/17/2018 0340 Gross per 24 hour  Intake 480 ml  Output 1001 ml  Net -521 ml   Filed Weights   03/15/18 0619 03/16/18 0503 03/17/18 0333  Weight: 82.4 kg 83.7 kg 84.1 kg    Examination:   General: deconditioned  Neurology: Awake and alert, non focal  E ENT: mild pallor, no icterus, oral mucosa moist Cardiovascular: No JVD. S1-S2 present, irregularly irregular, no gallops, rubs, or murmurs. No lower extremity edema. Pulmonary: positive  breath sounds bilaterally, adequate air movement, no wheezing, rhonchi or rales. Gastrointestinal. Abdomen with no organomegaly, non tender, no rebound or guarding Skin. No rashes Musculoskeletal: no joint deformities     Data Reviewed: I have personally reviewed following labs and imaging studies  CBC: Recent Labs  Lab 03/13/18 1834 03/14/18 0555 03/14/18 1926 03/15/18 0548 03/15/18 1905 03/17/18 0454  WBC 10.2 11.9* 11.2* 10.8* 9.5  --   NEUTROABS 8.4* 9.7* 9.5* 8.7* 7.4  --   HGB 7.1* 9.6* 9.7* 9.3* 9.2* 7.9*  HCT 23.4* 30.8* 31.6* 30.3* 29.7* 26.7*  MCV 86.7 85.6 84.5 85.4 85.1  --   PLT 286 276 287 273 265  --    Basic Metabolic Panel: Recent Labs  Lab 03/13/18 1114 03/14/18 0555 03/15/18 0548 03/16/18 0543 03/17/18 0454  NA 131* 132* 132* 131* 132*  K 3.6 4.8 3.7 3.8 4.2  CL 108 105 106 107 107  CO2 16* 21* 18* 16* 20*  GLUCOSE 138* 141* 241* 161* 238*  BUN 40* 35* 49* 63* 79*   CREATININE 1.69* 1.57* 2.01* 2.26* 2.69*  CALCIUM 8.2* 8.2* 7.9* 7.9* 8.3*   GFR: Estimated Creatinine Clearance: 18.2 mL/min (A) (by C-G formula based on SCr of 2.69 mg/dL (H)). Liver Function Tests: Recent Labs  Lab 03/13/18 1114  AST 43*  ALT 29  ALKPHOS 103  BILITOT 0.9  PROT 7.2  ALBUMIN 1.8*   No results for input(s): LIPASE, AMYLASE in the last 168 hours. No results for input(s): AMMONIA in the last 168 hours. Coagulation Profile: No results for input(s): INR, PROTIME in the last 168 hours. Cardiac Enzymes: Recent Labs  Lab 03/13/18 1114  TROPONINI 0.05*   BNP (last 3 results) No results for input(s): PROBNP in the last 8760 hours. HbA1C: No results for input(s): HGBA1C in the last 72 hours. CBG: Recent Labs  Lab 03/16/18 0737 03/16/18 1152 03/16/18 1653 03/16/18 2053 03/17/18 0719  GLUCAP 141* 195* 254* 162* 203*   Lipid Profile: No results for input(s): CHOL, HDL, LDLCALC, TRIG, CHOLHDL, LDLDIRECT in the last 72 hours. Thyroid Function Tests: No results for input(s): TSH, T4TOTAL, FREET4, T3FREE, THYROIDAB in the last 72 hours. Anemia Panel: No results for input(s): VITAMINB12, FOLATE, FERRITIN, TIBC, IRON, RETICCTPCT in the last 72 hours.    Radiology Studies: I have reviewed all of the imaging during this hospital visit personally  Scheduled Meds: . amLODipine  5 mg Oral Daily  . apixaban  5 mg Oral BID  . atorvastatin  80 mg Oral q1800  . Chlorhexidine Gluconate Cloth  6 each Topical Q0600  . feeding supplement  1 Container Oral TID BM  . hydrocerin   Topical BID  . insulin aspart  0-15 Units Subcutaneous TID WC  . insulin aspart  0-5 Units Subcutaneous QHS  . insulin NPH Human  10 Units Subcutaneous QHS  . insulin NPH Human  15 Units Subcutaneous QAC breakfast  . losartan  50 mg Oral Daily  . metoprolol tartrate  25 mg Oral BID  . multivitamin with minerals  1 tablet Oral Daily  . mupirocin ointment  1 application Nasal BID  .  nutrition supplement (JUVEN)  1 packet Oral BID BM  . pantoprazole (PROTONIX) IV  40 mg Intravenous Q12H  . sodium chloride flush  3 mL Intravenous Q12H   Continuous Infusions: . sodium chloride       LOS: 4 days        Otha Rickles Gerome Apley, MD [facility] Pager [phone]

## 2018-03-18 DIAGNOSIS — L97511 Non-pressure chronic ulcer of other part of right foot limited to breakdown of skin: Secondary | ICD-10-CM

## 2018-03-18 DIAGNOSIS — E1151 Type 2 diabetes mellitus with diabetic peripheral angiopathy without gangrene: Secondary | ICD-10-CM

## 2018-03-18 LAB — CBC WITH DIFFERENTIAL/PLATELET
Abs Immature Granulocytes: 0.06 10*3/uL (ref 0.00–0.07)
Basophils Absolute: 0 10*3/uL (ref 0.0–0.1)
Basophils Relative: 0 %
Eosinophils Absolute: 0.4 10*3/uL (ref 0.0–0.5)
Eosinophils Relative: 4 %
HCT: 28.9 % — ABNORMAL LOW (ref 36.0–46.0)
Hemoglobin: 8.6 g/dL — ABNORMAL LOW (ref 12.0–15.0)
Immature Granulocytes: 1 %
Lymphocytes Relative: 5 %
Lymphs Abs: 0.5 10*3/uL — ABNORMAL LOW (ref 0.7–4.0)
MCH: 25.5 pg — ABNORMAL LOW (ref 26.0–34.0)
MCHC: 29.8 g/dL — ABNORMAL LOW (ref 30.0–36.0)
MCV: 85.8 fL (ref 80.0–100.0)
Monocytes Absolute: 0.8 10*3/uL (ref 0.1–1.0)
Monocytes Relative: 8 %
Neutro Abs: 8.3 10*3/uL — ABNORMAL HIGH (ref 1.7–7.7)
Neutrophils Relative %: 82 %
Platelets: 276 10*3/uL (ref 150–400)
RBC: 3.37 MIL/uL — ABNORMAL LOW (ref 3.87–5.11)
RDW: 17.6 % — ABNORMAL HIGH (ref 11.5–15.5)
WBC: 10.1 10*3/uL (ref 4.0–10.5)
nRBC: 0 % (ref 0.0–0.2)

## 2018-03-18 LAB — BASIC METABOLIC PANEL
Anion gap: 10 (ref 5–15)
BUN: 73 mg/dL — ABNORMAL HIGH (ref 8–23)
CO2: 19 mmol/L — ABNORMAL LOW (ref 22–32)
Calcium: 8.4 mg/dL — ABNORMAL LOW (ref 8.9–10.3)
Chloride: 104 mmol/L (ref 98–111)
Creatinine, Ser: 2.25 mg/dL — ABNORMAL HIGH (ref 0.44–1.00)
GFR calc Af Amer: 23 mL/min — ABNORMAL LOW (ref >60)
GFR calc non Af Amer: 20 mL/min — ABNORMAL LOW (ref >60)
Glucose, Bld: 205 mg/dL — ABNORMAL HIGH (ref 70–99)
POTASSIUM: 3.5 mmol/L (ref 3.5–5.1)
Sodium: 133 mmol/L — ABNORMAL LOW (ref 135–145)

## 2018-03-18 LAB — CULTURE, BLOOD (ROUTINE X 2)
CULTURE: NO GROWTH
CULTURE: NO GROWTH
SPECIAL REQUESTS: ADEQUATE
Special Requests: ADEQUATE

## 2018-03-18 LAB — GLUCOSE, CAPILLARY: Glucose-Capillary: 201 mg/dL — ABNORMAL HIGH (ref 70–99)

## 2018-03-18 MED ORDER — PANTOPRAZOLE SODIUM 40 MG PO TBEC: 40.0000 mg | DELAYED_RELEASE_TABLET | Freq: Every day | ORAL | 0 refills | Status: DC

## 2018-03-18 MED ORDER — METOPROLOL TARTRATE 25 MG PO TABS: 25.0000 mg | ORAL_TABLET | Freq: Two times a day (BID) | ORAL | 0 refills | Status: AC

## 2018-03-18 MED ORDER — FUROSEMIDE 40 MG PO TABS: 40.0000 mg | ORAL_TABLET | Freq: Three times a day (TID) | ORAL | Status: DC

## 2018-03-18 NOTE — Progress Notes (Signed)
Daughter here to provided transportation for pt to home. All personal belongings with pt.  Pt demonstrates no s/sx of distress.  All discharge information reviewed with daughter and patient.

## 2018-03-18 NOTE — Discharge Summary (Signed)
Physician Discharge Summary  Kristina Summers QIW:[id number] DOB: 1935-06-11 DOA: 03/13/2018  PCP: [facility], [place] date: 03/13/2018 Discharge date: 03/18/2018  Admitted From: Home  Disposition:  Home   Recommendations for Outpatient Follow-up and new medication changes:  1. Follow up with Care, [facility] in 7 days 2. Patient has been placed on pantoprazole 40 mg day. 3. Continue to hold on aspirin. 4. Follow up with Dr. Cristina Gong in 2 weeks to follow hb and hct, may need repeat EGD or colonoscopy.   Home Health: Yes   Equipment/Devices: no    Discharge Condition: stable  CODE STATUS: dnr   Diet recommendation: heart healthy   Brief/Interim Summary: 82 year old female who presented with dyspnea. She does have significant past medical history for type 2 diabetes mellitus, peripheral vascular disease, atrial fibrillation, hypertension, dyslipidemia, diastolic heart failure, chronic kidney disease, coronary artery disease status post bypass grafting, aortic stenosis status post TAVR in July 2019.Recent hospitalizations in 2 occasions 08/20019for MRSA bacteremia and09/2019acute anemia requiring 3 units of packed red blood cell transfusion. Reporting worsening dyspnea for the last 7 days, worse with exertion, associated with orthopnea and PND along with lower extremity edema. On the initial physical examination blood pressure 165/66, heart rate 88, respiratory 25, oxygen saturation 99%, heart S1-S2 present irregularly irregular, lungs with bibasilar rales, abdomen soft nontender, no lower extremity edema. Sodium 131, potassium 3.6, chloride 108, bicarb 16, glucose 138, BUN 40, creatinine 1.69, BNP 2601, white count 9.2, hemoglobin 6.7, hematocrit 22.9, platelets 253.Chest radiograph with increased interstitial markings bilaterally consistent with pulmonary edema.EKG atrial fibrillation, normal axis, intraventricular conduction delay.  Patient was admitted to the hospital  working diagnosis of acute decompensation of chronic diastolic heart failure complicated by acute symptomatic anemia.  1.  Acute on chronic diastolic heart failure exacerbation complicated by symptomatic anemia.  Patient was admitted to the medical ward, she was placed on a remote telemetry monitor, received 2 units packed red blood cell transfusion and IV furosemide.  Her symptoms have improved, patient will continue taking furosemide, metoprolol was increased to 25 g twice daily.    2.  Acute symptomatic anemia, suspected occult GI bleed.  Hemoglobin and hematocrit remained stable after 2 units of packed red blood cell transfusion, her to stool hemoccult test was positive. Patient underwent capsule endoscopy which was normal.  Patient will continue taking proton pump inhibitor daily, aspirin has been held as an outpatient, patient will follow-up with the GI clinic in 2 weeks to repeat cell count, possible repeat endoscopy/colonoscopy. Discharge hemoglobin 8.6, hematocrit 28.9.   3.  Aortic stenosis status post TAVR.  Procedure performed July 2019, patient will follow-up with outpatient cardiothoracic surgery, patient is on anticoagulation for atrial fibrillation, aspirin was discontinued as an outpatient, March 06, 2018, due to anemia.  4.  Acute kidney injury and chronic kidney disease stage III.  Patient received IV furosemide, negative fluid balance was achieved, serum creatinine peaked at 2,69, by the time of discharge is down to 2.25, potassium 3.5, serum bicarb 19.  She will continue taking diuretic therapy with furosemide.  Follow-up kidney function as an outpatient.  5.  Type 2 diabetes mellitus.  Patient was placed on insulin sliding scale for glucose coverage monitoring, basal insulin with NPH.  6. HTN. Continue blood pressure control with amlodipine, losartan and metoprolol.  7. Dyslipidemia. Continue atorvastatin.   8. Right foot ulcer. No signs of local infection, continue local  wound care.   9.  Chronic atrial fibrillation.  Metoprolol was  increased to 25 mg twice daily, to improve rate control, continue anticoagulation with apixaban.  Discharge Diagnoses:  Principal Problem:   Acute on chronic systolic CHF (congestive heart failure) (HCC) Active Problems:   Paroxysmal atrial fibrillation   Diabetes mellitus type 2 with peripheral artery disease (HCC)   Hyperlipidemia   Acute on chronic congestive heart failure (HCC)   Essential hypertension   Symptomatic anemia   S/P TAVR (transcatheter aortic valve replacement)   CKD (chronic kidney disease), stage III (HCC)   Foot ulcer, right (HCC)   Acute blood loss anemia   Gastrointestinal hemorrhage    Discharge Instructions   Allergies as of 03/18/2018      Reactions   Morphine And Related Nausea And Vomiting, Other (See Comments)   Chest pain, also   Lisinopril Cough   Zoloft [sertraline Hcl] Nausea Only         Medication List    TAKE these medications   acetaminophen 500 MG tablet Commonly known as:  TYLENOL Take 500 mg by mouth daily as needed for moderate pain or headache.   amLODipine 10 MG tablet Commonly known as:  NORVASC Take 10 mg by mouth daily.   apixaban 5 MG Tabs tablet Commonly known as:  ELIQUIS Take 1 tablet (5 mg total) by mouth 2 (two) times daily.   atorvastatin 80 MG tablet Commonly known as:  LIPITOR Take 1 tablet (80 mg total) by mouth daily. What changed:  when to take this   furosemide 40 MG tablet Commonly known as:  LASIX Take 40 mg by mouth 3 (three) times daily.   glucose 4 GM chewable tablet Chew 1-4 tablets by mouth once as needed for low blood sugar.   insulin NPH Human 100 UNIT/ML injection Commonly known as:  HUMULIN N,NOVOLIN N Inject 0.15 mLs (15 Units total) into the skin 2 (two) times daily. 15 units in the morning and 10 units in the PM What changed:    how much to take  when to take this  additional instructions   insulin regular 100  units/mL injection Commonly known as:  NOVOLIN R,HUMULIN R Inject 5 Units into the skin 2 (two) times daily before a meal.   losartan 50 MG tablet Commonly known as:  COZAAR Take 1 tablet (50 mg total) by mouth daily.   metoprolol tartrate 25 MG tablet Commonly known as:  LOPRESSOR Take 1 tablet (25 mg total) by mouth 2 (two) times daily.   multivitamin with minerals Tabs tablet Take 1 tablet by mouth daily. One a Day 50 plus   nitroGLYCERIN 0.4 MG SL tablet Commonly known as:  NITROSTAT Place 0.4 mg under the tongue every 5 (five) minutes as needed for chest pain.   pantoprazole 40 MG tablet Commonly known as:  PROTONIX Take 1 tablet (40 mg total) by mouth daily for 30 doses.   senna 8.6 MG Tabs tablet Commonly known as:  SENOKOT Take 1 tablet by mouth as needed for mild constipation.      Follow-up Information    [facility], [facility] Follow up.   Specialty:  Home Health Services Why:  Home Health RN and Physical Therapy-agency will call to arrange initial visit Contact information: [place] [address] [phone]          Allergies  Allergen Reactions  . Morphine And Related Nausea And Vomiting and Other (See Comments)    Chest pain, also   . Lisinopril Cough  . Zoloft [Sertraline Hcl] Nausea Only  Consultations:  GI    Procedures/Studies: Dg Chest 2 View  Result Date: 03/13/2018 CLINICAL DATA:  Shortness of breath EXAM: CHEST - 2 VIEW COMPARISON:  01/16/2018 FINDINGS: Cardiac shadow remains enlarged. Postsurgical changes are again seen and stable. Right-sided PICC line has been removed in the interval. Aortic calcifications are again noted. The lungs are hyperinflated with chronic interstitial changes bilaterally stable from the previous exam. Some central vascular congestion and mild interstitial edema is noted. No large pleural effusion is seen. IMPRESSION: Changes of mild CHF.  No focal infiltrate is seen. Electronically Signed    By: Inez Catalina M.D.   On: 03/13/2018 12:01   Dg Foot 2 Views Right  Result Date: 03/13/2018 CLINICAL DATA:  Foot ulcer. EXAM: RIGHT FOOT - 2 VIEW COMPARISON:  None. FINDINGS: There is no evidence of fracture or dislocation. No radiographic evidence of osteomyelitis. Osteoarthritic changes at the first metatarsophalangeal joint. Ventral soft tissue swelling at the level of the metatarsophalangeal joints. Two linear metallic density foreign bodies within the ventral soft tissues adjacent to the first metatarsophalangeal joint. IMPRESSION: No evidence of osseous abnormalities to suggest osteomyelitis. Two linear metallic density foreign bodies within the ventral soft tissues adjacent to the first metatarsophalangeal joint. Electronically Signed   By: Fidela Salisbury M.D.   On: 03/13/2018 15:37   Vas [place] Burnard Bunting With/wo Tbi  Result Date: 03/06/2018 LOWER EXTREMITY DOPPLER STUDY Indications: Peripheral artery disease, and Peripheral vascular disease.  Vascular Interventions: Right CIA stent 04/01/11. Orbital ahterectomy and                         angioplasty x 2 on 2/28/ and 8/1 2013. TAVR 11/08/17. Performing Technologist: Ralene Cork RVT  Examination Guidelines: A complete evaluation includes at minimum, Doppler waveform signals and systolic blood pressure reading at the level of bilateral brachial, anterior tibial, and posterior tibial arteries, when vessel segments are accessible. Bilateral testing is considered an integral part of a complete examination. Photoelectric Plethysmograph (PPG) waveforms and toe systolic pressure readings are included as required and additional duplex testing as needed. Limited examinations for reoccurring indications may be performed as noted.  ABI Findings: +---------+------------------+-----+----------+--------+ Right    Rt Pressure (mmHg)IndexWaveform  Comment  +---------+------------------+-----+----------+--------+ Brachial 161                                        +---------+------------------+-----+----------+--------+ ATA      148               0.92 biphasic           +---------+------------------+-----+----------+--------+ PTA      137               0.85 monophasic         +---------+------------------+-----+----------+--------+ Great Toe86                0.53                    +---------+------------------+-----+----------+--------+ +---------+------------------+-----+--------+-------+ Left     Lt Pressure (mmHg)IndexWaveformComment +---------+------------------+-----+--------+-------+ Brachial 154                                    +---------+------------------+-----+--------+-------+ ATA      153  0.95 biphasic        +---------+------------------+-----+--------+-------+ PTA      255               1.58 biphasic        +---------+------------------+-----+--------+-------+ Great Toe126               0.78                 +---------+------------------+-----+--------+-------+ +-------+-----------+-----------+------------+------------+ ABI/TBIToday's ABIToday's TBIPrevious ABIPrevious TBI +-------+-----------+-----------+------------+------------+ Right  0.92       0.53       0.81        0.58         +-------+-----------+-----------+------------+------------+ Left   [place]         0.78       [place]          0.69         +-------+-----------+-----------+------------+------------+ Bilateral ABIs and TBIs appear essentially unchanged.  Summary: Right: Resting right ankle-brachial index indicates mild right lower extremity arterial disease. The right toe-brachial index is abnormal. RT great toe pressure = 86 mmHg. Left: Resting left ankle-brachial index indicates noncompressible left lower extremity arteries.The left toe-brachial index is normal. TBIs are unreliable. LT Great toe pressure = 126 mmHg.  *See table(s) above for measurements and observations.  Electronically signed by Harold Barban MD on  03/06/2018 at 11:04:07 AM.   Final    Vas [place] Lower Extremity Arterial Duplex  Result Date: 03/06/2018 LOWER EXTREMITY ARTERIAL DUPLEX STUDY Indications: Peripheral artery disease, and Peripheral vascular disease.  Vascular Interventions: Right CIA stent 04/01/11. Orbital ahterectomy and                         angioplasty x 2 on 2/28/ and 8/1 2013. TAVR 11/08/17. Current ABI:            Right: 0.92/0.53 Left: [place]/0.78 Performing Technologist: Ralene Cork RVT  Examination Guidelines: A complete evaluation includes B-mode imaging, spectral Doppler, color Doppler, and power Doppler as needed of all accessible portions of each vessel. Bilateral testing is considered an integral part of a complete examination. Limited examinations for reoccurring indications may be performed as noted.  Left Duplex Findings: +-----------+--------+-----+--------+--------+--------+            PSV cm/sRatioStenosisWaveformComments +-----------+--------+-----+--------+--------+--------+ CFA Distal 171                  biphasic         +-----------+--------+-----+--------+--------+--------+ DFA        102                  biphasic         +-----------+--------+-----+--------+--------+--------+ SFA Prox   265                  biphasic         +-----------+--------+-----+--------+--------+--------+ SFA Mid    259                  biphasic         +-----------+--------+-----+--------+--------+--------+ SFA Distal 200                  biphasic         +-----------+--------+-----+--------+--------+--------+ POP Prox   161                  biphasic         +-----------+--------+-----+--------+--------+--------+ POP Distal 97  biphasic         +-----------+--------+-----+--------+--------+--------+ ATA Distal 82                   biphasic         +-----------+--------+-----+--------+--------+--------+ PTA Distal 76                   biphasic          +-----------+--------+-----+--------+--------+--------+ PERO Distal47                   biphasic         +-----------+--------+-----+--------+--------+--------+  Summary: Left: Diffuse calcific plaque throughout the left lower extremity. Velocities throughout the SFA are in the 50-74% range by velocity.  See table(s) above for measurements and observations. Electronically signed by Harold Barban MD on 03/06/2018 at 11:44:06 AM.    Final    Vas [place] Lower Extremity Venous (dvt)  Result Date: 03/08/2018  Lower Venous Study Indications: Swelling, and Cellulitis, SOB.  Performing Technologist: Caralee Ates BA, RVT, RDMS  Examination Guidelines: A complete evaluation includes B-mode imaging, spectral Doppler, color Doppler, and power Doppler as needed of all accessible portions of each vessel. Bilateral testing is considered an integral part of a complete examination. Limited examinations for reoccurring indications may be performed as noted.  Right Venous Findings: +---------+---------------+---------+-----------+----------+-------+          CompressibilityPhasicitySpontaneityPropertiesSummary +---------+---------------+---------+-----------+----------+-------+ CFV      Full           Yes      Yes                          +---------+---------------+---------+-----------+----------+-------+ SFJ      Full           Yes      Yes                          +---------+---------------+---------+-----------+----------+-------+ FV Prox  Full           Yes      Yes                          +---------+---------------+---------+-----------+----------+-------+ FV Mid   Full           Yes      Yes                          +---------+---------------+---------+-----------+----------+-------+ FV DistalFull           Yes      Yes                          +---------+---------------+---------+-----------+----------+-------+ PFV      Full           Yes      Yes                           +---------+---------------+---------+-----------+----------+-------+ POP      Full           Yes      Yes                          +---------+---------------+---------+-----------+----------+-------+ PTV      Full           Yes  Yes                          +---------+---------------+---------+-----------+----------+-------+ PERO     Full           Yes      Yes                          +---------+---------------+---------+-----------+----------+-------+  Left Venous Findings: +---------+---------------+---------+-----------+----------+-------+          CompressibilityPhasicitySpontaneityPropertiesSummary +---------+---------------+---------+-----------+----------+-------+ CFV      Full           Yes      Yes                          +---------+---------------+---------+-----------+----------+-------+ SFJ      Full           Yes      Yes                          +---------+---------------+---------+-----------+----------+-------+ FV Prox  Full           Yes      Yes                          +---------+---------------+---------+-----------+----------+-------+ FV Mid   Full           Yes      Yes                          +---------+---------------+---------+-----------+----------+-------+ FV DistalFull           Yes      Yes                          +---------+---------------+---------+-----------+----------+-------+ PFV      Full           Yes      Yes                          +---------+---------------+---------+-----------+----------+-------+ POP      Full           Yes      Yes                          +---------+---------------+---------+-----------+----------+-------+ PTV      Full           Yes      Yes                          +---------+---------------+---------+-----------+----------+-------+ PERO     Full           Yes      Yes                           +---------+---------------+---------+-----------+----------+-------+    Summary: Right: There is no evidence of deep vein thrombosis in the lower extremity. Left: There is no evidence of deep vein thrombosis in the lower extremity. Pulsatile flow noted throughout entire bilateral venous study.  *See table(s) above for measurements and observations. Electronically signed by Deitra Mayo MD on 03/08/2018 at 10:22:22 AM.    Final  Subjective: Patient is feeling better, back to baseline, no nausea or vomiting, no chest pain. No melena or hematochezia.   Discharge Exam: Vitals:   03/18/18 0450 03/18/18 0541  BP: (!) 171/64 (!) 149/74  Pulse: 96 86  Resp: 20   Temp: 97.8 F (36.6 C)   SpO2: 94% 99%   Vitals:   03/17/18 2102 03/18/18 0447 03/18/18 0450 03/18/18 0541  BP: (!) 144/50  (!) 171/64 (!) 149/74  Pulse: 83  96 86  Resp: 19  20   Temp: 99.3 F (37.4 C)  97.8 F (36.6 C)   TempSrc: Oral  Oral   SpO2: 100%  94% 99%  Weight:  83.1 kg    Height:        General: Not in pain or dyspnea Neurology: Awake and alert, non focal  E ENT: mild pallor, no icterus, oral mucosa moist Cardiovascular: No JVD. S1-S2 present, irregularly irregular, no gallops, rubs, or murmurs. No lower extremity edema. Pulmonary: positive breath sounds bilaterally, adequate air movement, no wheezing, rhonchi or rales. Gastrointestinal. Abdomen with no organomegaly, non tender, no rebound or guarding Skin. No rashes Musculoskeletal: no joint deformities   The results of significant diagnostics from this hospitalization (including imaging, microbiology, ancillary and laboratory) are listed below for reference.     Microbiology: Recent Results (from the past 240 hour(s))  Blood Cultures x 2 sites     Status: None   Collection Time: 03/13/18  3:45 PM  Result Value Ref Range Status   Specimen Description BLOOD RIGHT ANTECUBITAL  Final   Special Requests   Final    BOTTLES DRAWN AEROBIC AND  ANAEROBIC Blood Culture adequate volume   Culture   Final    NO GROWTH 5 DAYS Performed at [facility], [address] [place], [place] [address]    Report Status 03/18/2018 FINAL  Final  Blood Cultures x 2 sites     Status: None   Collection Time: 03/13/18  3:51 PM  Result Value Ref Range Status   Specimen Description BLOOD RIGHT FOREARM  Final   Special Requests   Final    BOTTLES DRAWN AEROBIC AND ANAEROBIC Blood Culture adequate volume   Culture   Final    NO GROWTH 5 DAYS Performed at [facility], [place] [address] [place], [place] [address]    Report Status 03/18/2018 FINAL  Final  MRSA PCR Screening     Status: Abnormal   Collection Time: 03/13/18  5:59 PM  Result Value Ref Range Status   MRSA by PCR POSITIVE (A) NEGATIVE Final    Comment:        The GeneXpert MRSA Assay (FDA approved for NASAL specimens only), is one component of a comprehensive MRSA colonization surveillance program. It is not intended to diagnose MRSA infection nor to guide or monitor treatment for MRSA infections. RESULT CALLED TO, READ BACK BY AND VERIFIED WITH: V.GYEKYE,RN AT 2050 BY L.PITT 03/12/18      Labs: BNP (last 3 results) Recent Labs    12/11/17 2132 01/16/18 0910 03/13/18 1110  BNP 1,270.0* 724.9* 1,062.6*   Basic Metabolic Panel: Recent Labs  Lab 03/14/18 0555 03/15/18 0548 03/16/18 0543 03/17/18 0454 03/18/18 0402  NA 132* 132* 131* 132* 133*  K 4.8 3.7 3.8 4.2 3.5  CL 105 106 107 107 104  CO2 21* 18* 16* 20* 19*  GLUCOSE 141* 241* 161* 238* 205*  BUN 35* 49* 63* 79* 73*  CREATININE 1.57* 2.01* 2.26* 2.69* 2.25*  CALCIUM 8.2* 7.9* 7.9* 8.3* 8.4*   Liver Function Tests: Recent Labs  Lab 03/13/18 1114  AST 43*  ALT 29  ALKPHOS 103  BILITOT 0.9  PROT 7.2  ALBUMIN 1.8*   No results for input(s): LIPASE, AMYLASE in the last 168 hours. No results for input(s): AMMONIA in the last 168 hours. CBC: Recent Labs  Lab 03/14/18 0555 03/14/18 1926  03/15/18 0548 03/15/18 1905 03/17/18 0454 03/18/18 0402  WBC 11.9* 11.2* 10.8* 9.5  --  10.1  NEUTROABS 9.7* 9.5* 8.7* 7.4  --  8.3*  HGB 9.6* 9.7* 9.3* 9.2* 7.9* 8.6*  HCT 30.8* 31.6* 30.3* 29.7* 26.7* 28.9*  MCV 85.6 84.5 85.4 85.1  --  85.8  PLT 276 287 273 265  --  276   Cardiac Enzymes: Recent Labs  Lab 03/13/18 1114  TROPONINI 0.05*   BNP: Invalid input(s): POCBNP CBG: Recent Labs  Lab 03/17/18 0719 03/17/18 1204 03/17/18 1711 03/17/18 2103 03/18/18 0801  GLUCAP 203* 203* 225* 184* 201*   D-Dimer No results for input(s): DDIMER in the last 72 hours. Hgb A1c No results for input(s): HGBA1C in the last 72 hours. Lipid Profile No results for input(s): CHOL, HDL, LDLCALC, TRIG, CHOLHDL, LDLDIRECT in the last 72 hours. Thyroid function studies No results for input(s): TSH, T4TOTAL, T3FREE, THYROIDAB in the last 72 hours.  Invalid input(s): FREET3 Anemia work up No results for input(s): VITAMINB12, FOLATE, FERRITIN, TIBC, IRON, RETICCTPCT in the last 72 hours. Urinalysis    Component Value Date/Time   COLORURINE STRAW (A) 01/16/2018 0845   APPEARANCEUR CLEAR 01/16/2018 0845   LABSPEC 1.009 01/16/2018 0845   PHURINE 6.0 01/16/2018 0845   GLUCOSEU NEGATIVE 01/16/2018 0845   HGBUR SMALL (A) 01/16/2018 0845   BILIRUBINUR NEGATIVE 01/16/2018 0845   KETONESUR NEGATIVE 01/16/2018 0845   PROTEINUR 100 (A) 01/16/2018 0845   UROBILINOGEN 1.0 05/29/2014 1052   NITRITE NEGATIVE 01/16/2018 0845   LEUKOCYTESUR NEGATIVE 01/16/2018 0845   Sepsis Labs Invalid input(s): PROCALCITONIN,  WBC,  LACTICIDVEN Microbiology Recent Results (from the past 240 hour(s))  Blood Cultures x 2 sites     Status: None   Collection Time: 03/13/18  3:45 PM  Result Value Ref Range Status   Specimen Description BLOOD RIGHT ANTECUBITAL  Final   Special Requests   Final    BOTTLES DRAWN AEROBIC AND ANAEROBIC Blood Culture adequate volume   Culture   Final    NO GROWTH 5 DAYS Performed at  [facility], [address] [place], [place] [address]    Report Status 03/18/2018 FINAL  Final  Blood Cultures x 2 sites     Status: None   Collection Time: 03/13/18  3:51 PM  Result Value Ref Range Status   Specimen Description BLOOD RIGHT FOREARM  Final   Special Requests   Final    BOTTLES DRAWN AEROBIC AND ANAEROBIC Blood Culture adequate volume   Culture   Final    NO GROWTH 5 DAYS Performed at [facility], [place] [address] [place], [place] [address]    Report Status 03/18/2018 FINAL  Final  MRSA PCR Screening     Status: Abnormal   Collection Time: 03/13/18  5:59 PM  Result Value Ref Range Status   MRSA by PCR POSITIVE (A) NEGATIVE Final    Comment:        The GeneXpert MRSA Assay (FDA approved for NASAL specimens only), is one component of a comprehensive MRSA colonization surveillance program. It is not  intended to diagnose MRSA infection nor to guide or monitor treatment for MRSA infections. RESULT CALLED TO, READ BACK BY AND VERIFIED WITH: V.GYEKYE,RN AT 2050 BY L.PITT 03/12/18      Time coordinating discharge: 45 minutes  SIGNED:   Tawni Millers, MD  [facility] 03/18/2018, 9:59 AM Pager [phone]  If 7PM-7AM, please contact night-coverage [email or web address] Password [email or web address]

## 2018-03-18 NOTE — Progress Notes (Signed)
Patient resides with son. Son was contacted via phone to let him know his mother had been discharged by the physician this date.  Son states he is unable to pick his mother up as he is out of town until Monday. Son states to contact one of his sisters. Spoke with [name] that stated she was in [place] and that her other sister could be reached for transportation of patient. Daughter Nunzio Cory called and msg left for her to contact the unit regarding transportation of her mother.

## 2018-03-18 NOTE — Progress Notes (Signed)
SATURATION QUALIFICATIONS: (This note is used to comply with regulatory documentation for home oxygen)  Patient Saturations on Room Air at Rest = 97%  Patient Saturations on Room Air while Ambulating = 96%  Patient Saturations on 0 Liters of oxygen while Ambulating = 0%  Please briefly explain why patient needs home oxygen:  No needs identified.

## 2018-03-18 NOTE — Progress Notes (Signed)
Patient's daughter Nunzio Cory called to say she would be here in an hour to pick up her mom.

## 2018-03-20 ENCOUNTER — Inpatient Hospital Stay (HOSPITAL_COMMUNITY)
Admission: EM | Admit: 2018-03-20 | Discharge: 2018-03-23 | DRG: 291 | Disposition: A | Discharge status: Home Health | Payer: Medicare Other | Attending: Internal Medicine | Admitting: Internal Medicine

## 2018-03-20 ENCOUNTER — Encounter (HOSPITAL_COMMUNITY): Payer: Self-pay | Admitting: Emergency Medicine

## 2018-03-20 ENCOUNTER — Emergency Department (HOSPITAL_COMMUNITY): Payer: Medicare Other

## 2018-03-20 ENCOUNTER — Other Ambulatory Visit: Payer: Self-pay

## 2018-03-20 DIAGNOSIS — I5023 Acute on chronic systolic (congestive) heart failure: Secondary | ICD-10-CM | POA: Diagnosis not present

## 2018-03-20 DIAGNOSIS — N183 Chronic kidney disease, stage 3 (moderate): Secondary | ICD-10-CM

## 2018-03-20 DIAGNOSIS — I2510 Atherosclerotic heart disease of native coronary artery without angina pectoris: Secondary | ICD-10-CM | POA: Diagnosis present

## 2018-03-20 DIAGNOSIS — D649 Anemia, unspecified: Secondary | ICD-10-CM | POA: Diagnosis not present

## 2018-03-20 DIAGNOSIS — D509 Iron deficiency anemia, unspecified: Secondary | ICD-10-CM | POA: Diagnosis not present

## 2018-03-20 DIAGNOSIS — I480 Paroxysmal atrial fibrillation: Secondary | ICD-10-CM | POA: Diagnosis present

## 2018-03-20 DIAGNOSIS — Z66 Do not resuscitate: Secondary | ICD-10-CM | POA: Diagnosis present

## 2018-03-20 DIAGNOSIS — Z953 Presence of xenogenic heart valve: Secondary | ICD-10-CM

## 2018-03-20 DIAGNOSIS — N184 Chronic kidney disease, stage 4 (severe): Secondary | ICD-10-CM | POA: Diagnosis present

## 2018-03-20 DIAGNOSIS — R7989 Other specified abnormal findings of blood chemistry: Secondary | ICD-10-CM | POA: Diagnosis present

## 2018-03-20 DIAGNOSIS — Z888 Allergy status to other drugs, medicaments and biological substances status: Secondary | ICD-10-CM

## 2018-03-20 DIAGNOSIS — N3001 Acute cystitis with hematuria: Secondary | ICD-10-CM

## 2018-03-20 DIAGNOSIS — I5033 Acute on chronic diastolic (congestive) heart failure: Secondary | ICD-10-CM

## 2018-03-20 DIAGNOSIS — I130 Hypertensive heart and chronic kidney disease with heart failure and stage 1 through stage 4 chronic kidney disease, or unspecified chronic kidney disease: Secondary | ICD-10-CM | POA: Diagnosis present

## 2018-03-20 DIAGNOSIS — I509 Heart failure, unspecified: Secondary | ICD-10-CM

## 2018-03-20 DIAGNOSIS — E1151 Type 2 diabetes mellitus with diabetic peripheral angiopathy without gangrene: Secondary | ICD-10-CM

## 2018-03-20 DIAGNOSIS — Z8673 Personal history of transient ischemic attack (TIA), and cerebral infarction without residual deficits: Secondary | ICD-10-CM

## 2018-03-20 DIAGNOSIS — M797 Fibromyalgia: Secondary | ICD-10-CM | POA: Diagnosis present

## 2018-03-20 DIAGNOSIS — I5043 Acute on chronic combined systolic (congestive) and diastolic (congestive) heart failure: Secondary | ICD-10-CM | POA: Diagnosis not present

## 2018-03-20 DIAGNOSIS — D500 Iron deficiency anemia secondary to blood loss (chronic): Secondary | ICD-10-CM | POA: Diagnosis present

## 2018-03-20 DIAGNOSIS — Z8614 Personal history of Methicillin resistant Staphylococcus aureus infection: Secondary | ICD-10-CM | POA: Diagnosis not present

## 2018-03-20 DIAGNOSIS — E1129 Type 2 diabetes mellitus with other diabetic kidney complication: Secondary | ICD-10-CM | POA: Diagnosis present

## 2018-03-20 DIAGNOSIS — Z885 Allergy status to narcotic agent status: Secondary | ICD-10-CM

## 2018-03-20 DIAGNOSIS — E1122 Type 2 diabetes mellitus with diabetic chronic kidney disease: Secondary | ICD-10-CM | POA: Diagnosis present

## 2018-03-20 DIAGNOSIS — Z952 Presence of prosthetic heart valve: Secondary | ICD-10-CM | POA: Diagnosis not present

## 2018-03-20 DIAGNOSIS — Z794 Long term (current) use of insulin: Secondary | ICD-10-CM

## 2018-03-20 DIAGNOSIS — I10 Essential (primary) hypertension: Secondary | ICD-10-CM

## 2018-03-20 DIAGNOSIS — Z951 Presence of aortocoronary bypass graft: Secondary | ICD-10-CM | POA: Diagnosis not present

## 2018-03-20 DIAGNOSIS — Z79899 Other long term (current) drug therapy: Secondary | ICD-10-CM

## 2018-03-20 DIAGNOSIS — E785 Hyperlipidemia, unspecified: Secondary | ICD-10-CM | POA: Diagnosis present

## 2018-03-20 DIAGNOSIS — I482 Chronic atrial fibrillation, unspecified: Secondary | ICD-10-CM | POA: Diagnosis present

## 2018-03-20 DIAGNOSIS — K219 Gastro-esophageal reflux disease without esophagitis: Secondary | ICD-10-CM | POA: Diagnosis present

## 2018-03-20 DIAGNOSIS — Z7901 Long term (current) use of anticoagulants: Secondary | ICD-10-CM

## 2018-03-20 DIAGNOSIS — E1165 Type 2 diabetes mellitus with hyperglycemia: Secondary | ICD-10-CM | POA: Diagnosis present

## 2018-03-20 LAB — URINALYSIS, ROUTINE W REFLEX MICROSCOPIC
Bilirubin Urine: NEGATIVE
Glucose, UA: >=500 mg/dL — AB
KETONES UR: NEGATIVE mg/dL
Nitrite: NEGATIVE
Protein, ur: 100 mg/dL — AB
Specific Gravity, Urine: 1.013 (ref 1.005–1.030)
pH: 6 (ref 5.0–8.0)

## 2018-03-20 LAB — COMPREHENSIVE METABOLIC PANEL
ALT: 32 U/L (ref 0–44)
AST: 52 U/L — ABNORMAL HIGH (ref 15–41)
Albumin: 1.6 g/dL — ABNORMAL LOW (ref 3.5–5.0)
Alkaline Phosphatase: 118 U/L (ref 38–126)
Anion gap: 10 (ref 5–15)
BILIRUBIN TOTAL: 1 mg/dL (ref 0.3–1.2)
BUN: 62 mg/dL — ABNORMAL HIGH (ref 8–23)
CO2: 18 mmol/L — ABNORMAL LOW (ref 22–32)
Calcium: 8.3 mg/dL — ABNORMAL LOW (ref 8.9–10.3)
Chloride: 102 mmol/L (ref 98–111)
Creatinine, Ser: 1.97 mg/dL — ABNORMAL HIGH (ref 0.44–1.00)
GFR calc Af Amer: 27 mL/min — ABNORMAL LOW (ref >60)
GFR calc non Af Amer: 23 mL/min — ABNORMAL LOW (ref >60)
Glucose, Bld: 359 mg/dL — ABNORMAL HIGH (ref 70–99)
POTASSIUM: 3.9 mmol/L (ref 3.5–5.1)
Sodium: 130 mmol/L — ABNORMAL LOW (ref 135–145)
TOTAL PROTEIN: 7.6 g/dL (ref 6.5–8.1)

## 2018-03-20 LAB — CBC WITH DIFFERENTIAL/PLATELET
Abs Immature Granulocytes: 0.02 10*3/uL (ref 0.00–0.07)
BASOS ABS: 0 10*3/uL (ref 0.0–0.1)
Basophils Relative: 0 %
EOS PCT: 5 %
Eosinophils Absolute: 0.5 10*3/uL (ref 0.0–0.5)
HEMATOCRIT: 28.2 % — AB (ref 36.0–46.0)
Hemoglobin: 8.2 g/dL — ABNORMAL LOW (ref 12.0–15.0)
Immature Granulocytes: 0 %
Lymphocytes Relative: 7 %
Lymphs Abs: 0.7 10*3/uL (ref 0.7–4.0)
MCH: 25.4 pg — ABNORMAL LOW (ref 26.0–34.0)
MCHC: 29.1 g/dL — ABNORMAL LOW (ref 30.0–36.0)
MCV: 87.3 fL (ref 80.0–100.0)
Monocytes Absolute: 0.7 10*3/uL (ref 0.1–1.0)
Monocytes Relative: 7 %
Neutro Abs: 7.8 10*3/uL — ABNORMAL HIGH (ref 1.7–7.7)
Neutrophils Relative %: 81 %
Platelets: 284 10*3/uL (ref 150–400)
RBC: 3.23 MIL/uL — ABNORMAL LOW (ref 3.87–5.11)
RDW: 17.5 % — ABNORMAL HIGH (ref 11.5–15.5)
WBC: 9.7 10*3/uL (ref 4.0–10.5)
nRBC: 0 % (ref 0.0–0.2)

## 2018-03-20 LAB — I-STAT CG4 LACTIC ACID, ED
Lactic Acid, Venous: 1.39 mmol/L (ref 0.5–1.9)
Lactic Acid, Venous: 0.93 mmol/L (ref 0.5–1.9)

## 2018-03-20 LAB — GLUCOSE, CAPILLARY: Glucose-Capillary: 257 mg/dL — ABNORMAL HIGH (ref 70–99)

## 2018-03-20 LAB — I-STAT TROPONIN, ED
Troponin i, poc: 0.05 ng/mL (ref 0.00–0.08)
Troponin i, poc: 0.04 ng/mL (ref 0.00–0.08)

## 2018-03-20 LAB — PROTIME-INR
INR: 1.24
Prothrombin Time: 15.4 seconds — ABNORMAL HIGH (ref 11.4–15.2)

## 2018-03-20 LAB — PHOSPHORUS: Phosphorus: 3 mg/dL (ref 2.5–4.6)

## 2018-03-20 LAB — BRAIN NATRIURETIC PEPTIDE: B Natriuretic Peptide: 1119.8 pg/mL — ABNORMAL HIGH (ref 0.0–100.0)

## 2018-03-20 LAB — TSH: TSH: 1.73 u[IU]/mL (ref 0.350–4.500)

## 2018-03-20 LAB — MAGNESIUM: MAGNESIUM: 2 mg/dL (ref 1.7–2.4)

## 2018-03-20 LAB — TROPONIN I: Troponin I: 0.05 ng/mL (ref <0.03)

## 2018-03-20 LAB — LIPASE, BLOOD: Lipase: 22 U/L (ref 11–51)

## 2018-03-20 LAB — CBG MONITORING, ED: Glucose-Capillary: 260 mg/dL — ABNORMAL HIGH (ref 70–99)

## 2018-03-20 MED ORDER — APIXABAN 5 MG PO TABS
5.0000 mg | ORAL_TABLET | Freq: Two times a day (BID) | ORAL | Status: DC
  Administered 2018-03-20 – 2018-03-23 (×6): 5 mg via ORAL
  Filled 2018-03-20 (×6): qty 1

## 2018-03-20 MED ORDER — ACETAMINOPHEN 325 MG PO TABS: 650.0000 mg | ORAL_TABLET | ORAL | Status: DC | PRN

## 2018-03-20 MED ORDER — SODIUM CHLORIDE 0.9% FLUSH
3.0000 mL | Freq: Two times a day (BID) | INTRAVENOUS | Status: DC
  Administered 2018-03-20 – 2018-03-23 (×6): 3 mL via INTRAVENOUS

## 2018-03-20 MED ORDER — INSULIN ASPART 100 UNIT/ML ~~LOC~~ SOLN
5.0000 [IU] | Freq: Two times a day (BID) | SUBCUTANEOUS | Status: DC
  Administered 2018-03-21 – 2018-03-23 (×5): 5 [IU] via SUBCUTANEOUS

## 2018-03-20 MED ORDER — INSULIN NPH (HUMAN) (ISOPHANE) 100 UNIT/ML ~~LOC~~ SUSP
10.0000 [IU] | Freq: Every day | SUBCUTANEOUS | Status: DC
  Administered 2018-03-21 – 2018-03-22 (×3): 10 [IU] via SUBCUTANEOUS
  Filled 2018-03-20: qty 10

## 2018-03-20 MED ORDER — SODIUM CHLORIDE 0.9 % IV SOLN: 1.0000 g | INTRAVENOUS | Status: DC

## 2018-03-20 MED ORDER — INSULIN NPH (HUMAN) (ISOPHANE) 100 UNIT/ML ~~LOC~~ SUSP
15.0000 [IU] | Freq: Every day | SUBCUTANEOUS | Status: DC
  Administered 2018-03-21 – 2018-03-22 (×2): 15 [IU] via SUBCUTANEOUS
  Administered 2018-03-23: 7 [IU] via SUBCUTANEOUS

## 2018-03-20 MED ORDER — ADULT MULTIVITAMIN W/MINERALS CH
1.0000 | ORAL_TABLET | Freq: Every day | ORAL | Status: DC
  Administered 2018-03-21 – 2018-03-23 (×3): 1 via ORAL
  Filled 2018-03-20 (×3): qty 1

## 2018-03-20 MED ORDER — ONDANSETRON HCL 4 MG/2ML IJ SOLN: 4.0000 mg | Freq: Four times a day (QID) | INTRAMUSCULAR | Status: DC | PRN

## 2018-03-20 MED ORDER — PANTOPRAZOLE SODIUM 40 MG PO TBEC
40.0000 mg | DELAYED_RELEASE_TABLET | Freq: Every day | ORAL | Status: DC
  Administered 2018-03-21 – 2018-03-23 (×3): 40 mg via ORAL
  Filled 2018-03-20 (×3): qty 1

## 2018-03-20 MED ORDER — INSULIN ASPART 100 UNIT/ML ~~LOC~~ SOLN
0.0000 [IU] | Freq: Every day | SUBCUTANEOUS | Status: DC
  Administered 2018-03-20 – 2018-03-21 (×2): 3 [IU] via SUBCUTANEOUS
  Administered 2018-03-22: 5 [IU] via SUBCUTANEOUS

## 2018-03-20 MED ORDER — METOPROLOL TARTRATE 25 MG PO TABS
25.0000 mg | ORAL_TABLET | Freq: Two times a day (BID) | ORAL | Status: DC
  Administered 2018-03-20 – 2018-03-23 (×6): 25 mg via ORAL
  Filled 2018-03-20 (×6): qty 1

## 2018-03-20 MED ORDER — AMLODIPINE BESYLATE 10 MG PO TABS
10.0000 mg | ORAL_TABLET | Freq: Every day | ORAL | Status: DC
  Administered 2018-03-21 – 2018-03-23 (×3): 10 mg via ORAL
  Filled 2018-03-20 (×3): qty 1

## 2018-03-20 MED ORDER — FUROSEMIDE 10 MG/ML IJ SOLN
40.0000 mg | Freq: Once | INTRAMUSCULAR | Status: AC
  Administered 2018-03-20: 40 mg via INTRAVENOUS
  Filled 2018-03-20: qty 4

## 2018-03-20 MED ORDER — SODIUM CHLORIDE 0.9 % IV SOLN
1.0000 g | Freq: Once | INTRAVENOUS | Status: AC
  Administered 2018-03-20: 1 g via INTRAVENOUS
  Filled 2018-03-20: qty 10

## 2018-03-20 MED ORDER — SODIUM CHLORIDE 0.9% FLUSH: 3.0000 mL | INTRAVENOUS | Status: DC | PRN

## 2018-03-20 MED ORDER — SODIUM CHLORIDE 0.9 % IV SOLN: 250.0000 mL | INTRAVENOUS | Status: DC | PRN

## 2018-03-20 MED ORDER — FUROSEMIDE 10 MG/ML IJ SOLN
40.0000 mg | Freq: Two times a day (BID) | INTRAMUSCULAR | Status: DC
  Administered 2018-03-21 (×2): 40 mg via INTRAVENOUS
  Filled 2018-03-20 (×2): qty 4

## 2018-03-20 MED ORDER — ATORVASTATIN CALCIUM 80 MG PO TABS
80.0000 mg | ORAL_TABLET | Freq: Every day | ORAL | Status: DC
  Administered 2018-03-21 – 2018-03-22 (×2): 80 mg via ORAL
  Filled 2018-03-20 (×2): qty 1

## 2018-03-20 MED ORDER — ENSURE ENLIVE PO LIQD
237.0000 mL | Freq: Two times a day (BID) | ORAL | Status: DC
  Administered 2018-03-21 (×2): 237 mL via ORAL

## 2018-03-20 NOTE — ED Provider Notes (Addendum)
[facility] EMERGENCY DEPARTMENT Provider Note   CSN: [id number] Arrival date & time: 03/20/18  1633     History   Chief Complaint Chief Complaint  Patient presents with  . Shortness of Breath    HPI Kristina Summers is a 81 y.o. female.  HPI Patient reports that she has been getting increasingly short of breath for a couple of weeks.  She was recently hospitalized and only discharged 2 days ago.  She reports she did not feel much better at discharge.  She was diagnosed with acute on chronic diastolic heart failure exacerbation and symptomatic anemia.  Patient was transfused 2 units of blood and given Lasix.  Patient reports that after she had her aortic stenosis repaired by TAVR in July, she felt like a new person.  She reports she went from having to take multiple breaks when ambulating short distances to walking comfortably.  She reports this lasted for a number of months but now over the past few weeks she has been getting increasingly short of breath to the point of not being on the really get up or do anything.  At rest she feels very short of breath as well.  She denies she has any chest pain.  She has not had a productive cough.  She does note increased swelling in her legs. Past Medical History:  Diagnosis Date  . Anemia   . CAD (coronary artery disease)    a. s/p CABG in 2000  . Carotid artery occlusion   . CKD (chronic kidney disease)   . Diastolic CHF ([place]) 10/6224  . Fibromyalgia   . GERD (gastroesophageal reflux disease)   . History of CVA (cerebrovascular accident) 05/24/2014  . History of GI bleed   . Hyperlipidemia   . Hypertension   . Obesity   . PAF (paroxysmal atrial fibrillation) (HCC)    a. on Eliquis  . Peripheral vascular disease ([place])   . 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 Type II diabetes mellitus ([place]) dx'd  1979    Patient Active Problem List   Diagnosis Date Noted  . Gastrointestinal hemorrhage   . Acute on chronic systolic CHF (congestive heart failure) ([place]) 03/13/2018  . Decreased hearing 02/03/2018  . History of myocardial infarction 02/03/2018  . Methicillin resistant Staphylococcus aureus infection 02/03/2018  . Acute blood loss anemia 12/22/2017  . MRSA bacteremia 12/12/2017  . CKD (chronic kidney disease), stage III ([place]) 12/11/2017  . UTI (urinary tract infection) 12/11/2017  . Foot ulcer, right ([place]) 12/11/2017  . 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 Severe aortic stenosis 10/11/2017  . Symptomatic anemia 08/25/2017  . Essential hypertension 06/10/2017  . Type II diabetes mellitus with renal manifestations ([place]) 06/10/2017  . Acute on chronic congestive heart failure ([place]) 01/28/2017  . Fall   . Fracture of distal end of right fibula 05/29/2014  . History of CVA (cerebrovascular accident) 05/24/2014  . Peripheral vascular disease, unspecified ([place]) 01/11/2014  . CAD (coronary artery disease)   . Paroxysmal atrial fibrillation   . Diabetes mellitus type 2 with peripheral artery disease ([place])   . Hyperlipidemia   . History of GI bleed 04/21/2009  . S/P CABG (coronary artery bypass graft) 11/20/1998    Past Surgical History:  Procedure Laterality Date  . ABDOMINAL AORTAGRAM N/A 04/01/2011   Procedure: ABDOMINAL AORTAGRAM;  Surgeon: Conrad Atascadero, MD;  Location: [facility] CATH LAB;  Service: Cardiovascular;  Laterality: N/A;  . ANGIOPLASTY  06/17/11   Left leg common femoral artery cannulation under u/s Left leg runoff  . CARDIAC CATHETERIZATION  10/11/2017  . CARPAL TUNNEL RELEASE Right   . CATARACT EXTRACTION W/ INTRAOCULAR LENS  IMPLANT, BILATERAL  2004-2005  . CORONARY ARTERY BYPASS GRAFT  2000   CABG X5  . ESOPHAGOGASTRODUODENOSCOPY (EGD) WITH PROPOFOL N/A 12/25/2017   Procedure: ESOPHAGOGASTRODUODENOSCOPY (EGD) WITH PROPOFOL;  Surgeon: Wonda Horner, MD;  Location: [facility] ENDOSCOPY;  Service: Endoscopy;  Laterality: N/A;  . EYE SURGERY Left    "lasered before cataract OR"  . GIVENS CAPSULE STUDY  03/14/2018  . GIVENS CAPSULE STUDY N/A 03/14/2018   Procedure: GIVENS CAPSULE STUDY;  Surgeon: Ronald Lobo, MD;  Location: [facility];  Service: Endoscopy;  Laterality: N/A;  . LOWER EXTREMITY ANGIOGRAM Bilateral 04/01/2011   Procedure: LOWER EXTREMITY ANGIOGRAM;  Surgeon: Conrad Arion, MD;  Location: [facility] CATH LAB;  Service: Cardiovascular;  Laterality: Bilateral;  . LOWER EXTREMITY ANGIOGRAM Left 06/17/2011   Procedure: LOWER EXTREMITY ANGIOGRAM;  Surgeon: Conrad Rathbun, MD;  Location: [facility] CATH LAB;  Service: Cardiovascular;  Laterality: Left;  . LOWER EXTREMITY ANGIOGRAM N/A 11/18/2011   Procedure: LOWER EXTREMITY ANGIOGRAM;  Surgeon: Conrad Marvell, MD;  Location: [facility] CATH LAB;  Service: Cardiovascular;  Laterality: N/A;  . PERCUTANEOUS STENT INTERVENTION Right 04/01/2011   Procedure: PERCUTANEOUS STENT INTERVENTION;  Surgeon: Conrad [place], MD;  Location[facility] CATH LAB;  Service: Cardiovascular;  Laterality: Right;  rt iliac stent  . PERIPHERAL ARTERIAL STENT GRAFT  04/01/11   right common iliac  . RIGHT/LEFT HEART CATH AND CORONARY/GRAFT ANGIOGRAPHY N/A 10/11/2017   Procedure: RIGHT/LEFT HEART CATH AND CORONARY/GRAFT ANGIOGRAPHY;  Surgeon: Sherren Mocha, MD;  Location: [facility] CV LAB;  Service: Cardiovascular;  Laterality: N/A;  . TEE WITHOUT CARDIOVERSION N/A 11/08/2017   Procedure: TRANSESOPHAGEAL ECHOCARDIOGRAM (TEE);  Surgeon: Sherren Mocha, MD;  Location: [facility];  Service: Open Heart Surgery;  Laterality: N/A;  . TEE WITHOUT CARDIOVERSION N/A 12/15/2017   Procedure: TRANSESOPHAGEAL ECHOCARDIOGRAM (TEE);  Surgeon: Larey Dresser, MD;  Location: [facility] ENDOSCOPY;  Service: Cardiovascular;  Laterality: N/A;  . TRANSCATHETER AORTIC VALVE REPLACEMENT, TRANSFEMORAL N/A 11/08/2017   Procedure: TRANSCATHETER AORTIC VALVE REPLACEMENT,  TRANSFEMORAL;  Surgeon: Sherren Mocha, MD;  Location: [facility];  Service: Open Heart Surgery;  Laterality: N/A;  . TRIGGER FINGER RELEASE Left 1996   thumb     OB History   None      Home Medications    Prior to Admission medications   Medication Sig Start Date End Date Taking? Authorizing Provider  acetaminophen (TYLENOL) 500 MG tablet Take 500 mg by mouth daily as needed for moderate pain or headache.    [provider]  amLODipine (NORVASC) 10 MG tablet Take 10 mg by mouth daily.     [provider]  apixaban (ELIQUIS) 5 MG TABS tablet Take 1 tablet (5 mg total) by mouth 2 (two) times daily. 12/26/17   Shelly Coss, MD  atorvastatin (LIPITOR) 80 MG tablet Take 1 tablet (80 mg total) by mouth daily. Patient taking differently: Take 80 mg by mouth daily at 6 PM.  11/27/12   Baker, Freeman Caldron, PA-C  furosemide (LASIX) 40 MG tablet Take 40 mg by mouth 3 (three) times daily.  06/30/17   [provider]  glucose 4 GM chewable tablet Chew 1-4 tablets by mouth once as needed for low blood sugar.  [provider]  insulin NPH Human (HUMULIN N,NOVOLIN N) 100 UNIT/ML injection Inject 0.15 mLs (15 Units total) into the skin 2 (two) times daily. 15 units in the morning and 10 units in the PM Patient taking differently: Inject 10-15 Units into the skin See admin instructions. Take 15 units in the morning and 10 units in the evening 10/12/17   Eileen Stanford, PA-C  insulin regular (NOVOLIN R,HUMULIN R) 100 units/mL injection Inject 5 Units into the skin 2 (two) times daily before a meal.     [provider]  losartan (COZAAR) 50 MG tablet Take 1 tablet (50 mg total) by mouth daily. 11/22/17   Eileen Stanford, PA-C  metoprolol tartrate (LOPRESSOR) 25 MG tablet Take 1 tablet (25 mg total) by mouth 2 (two) times daily. 03/18/18 04/17/18  Arrien, Jimmy Picket, MD  Multiple Vitamin (MULTIVITAMIN WITH MINERALS) TABS tablet Take 1 tablet by mouth daily.  One a Day 50 plus    [provider]  nitroGLYCERIN (NITROSTAT) 0.4 MG SL tablet Place 0.4 mg under the tongue every 5 (five) minutes as needed for chest pain.    [provider]  pantoprazole (PROTONIX) 40 MG tablet Take 1 tablet (40 mg total) by mouth daily for 30 doses. 03/18/18 04/17/18  Arrien, Jimmy Picket, MD  senna (SENOKOT) 8.6 MG TABS tablet Take 1 tablet by mouth as needed for mild constipation.    [provider]    Family History Family History  Problem Relation Age of Onset  . Other Brother        intestinal blockage  . Diabetes Brother     Social History Social History   Tobacco Use  . Smoking status: Former Smoker    Packs/day: 2.00    Years: 30.00    Pack years: 60.00    Types: Cigarettes    Last attempt to quit: 04/19/1990    Years since quitting: 27.9  . Smokeless tobacco: Never Used  . Tobacco comment: stopped smoking cigarettes 1991  Substance Use Topics  . Alcohol use: Not Currently    Alcohol/week: 0.0 standard drinks    Comment: "tried different alcohols when I was 1st married; never drank much at  ALL"  . Drug use: Never     Allergies   Morphine and related; Lisinopril; and Zoloft [sertraline hcl]   Review of Systems Review of Systems 10 Systems reviewed and are negative for acute change except as noted in the HPI.  Physical Exam Updated Vital Signs BP (!) 151/93   Pulse 89   Temp 97.7 F (36.5 C) (Oral)   Resp 18   Ht 5\' 6"  (1.676 m)   Wt 85.3 kg   SpO2 100%   BMI 30.34 kg/m   Physical Exam  Constitutional: She is oriented to person, place, and time.  Patient is alert and appropriate.  She does have increased work of breathing mild to moderate.  She appears slightly fatigued.  She however does not show signs of impending respiratory failure.  Patient is slightly pale in appearance.  HENT:  Head: Normocephalic and atraumatic.  Mouth/Throat: Oropharynx is clear and moist.  Eyes: EOM are normal.    Cardiovascular:  Irregularly irregular.  Borderline tachycardia.  S3 gallop.  Abdominal: Soft. She exhibits no distension. There is no tenderness. There is no guarding.  Musculoskeletal:  2+ pitting edema bilateral lower extremities.  Calves nontender.  Neurological: She is alert and oriented to person, place, and time. She exhibits normal muscle tone. Coordination  normal.  Skin: Skin is warm and dry. There is pallor.  Psychiatric: She has a normal mood and affect.     ED Treatments / Results  Labs (all labs ordered are listed, but only abnormal results are displayed) Labs Reviewed  COMPREHENSIVE METABOLIC PANEL - Abnormal; Notable for the following components:      Result Value   Sodium 130 (*)    CO2 18 (*)    Glucose, Bld 359 (*)    BUN 62 (*)    Creatinine, Ser 1.97 (*)    Calcium 8.3 (*)    Albumin 1.6 (*)    AST 52 (*)    GFR calc non Af Amer 23 (*)    GFR calc Af Amer 27 (*)    All other components within normal limits  BRAIN NATRIURETIC PEPTIDE - Abnormal; Notable for the following components:   B Natriuretic Peptide 1,119.8 (*)    All other components within normal limits  CBC WITH DIFFERENTIAL/PLATELET - Abnormal; Notable for the following components:   RBC 3.23 (*)    Hemoglobin 8.2 (*)    HCT 28.2 (*)    MCH 25.4 (*)    MCHC 29.1 (*)    RDW 17.5 (*)    Neutro Abs 7.8 (*)    All other components within normal limits  PROTIME-INR - Abnormal; Notable for the following components:   Prothrombin Time 15.4 (*)    All other components within normal limits  URINALYSIS, ROUTINE W REFLEX MICROSCOPIC - Abnormal; Notable for the following components:   APPearance HAZY (*)    Glucose, UA >=500 (*)    Hgb urine dipstick LARGE (*)    Protein, ur 100 (*)    Leukocytes, UA MODERATE (*)    Bacteria, UA MANY (*)    All other components within normal limits  URINE CULTURE  CULTURE, BLOOD (ROUTINE X 2)  CULTURE, BLOOD (ROUTINE X 2)  LIPASE, BLOOD  MAGNESIUM   PHOSPHORUS  TSH  I-STAT CG4 LACTIC ACID, ED  I-STAT TROPONIN, ED  I-STAT CG4 LACTIC ACID, ED  I-STAT TROPONIN, ED    EKG EKG Interpretation  Date/Time:  Monday March 20 2018 16:39:48 EST Ventricular Rate:  85 PR Interval:    QRS Duration: 119 QT Interval:  390 QTC Calculation: 464 R Axis:   67 Text Interpretation:  Atrial fibrillation Probable left ventricular hypertrophy Anterior Q waves, possibly due to LVH PVCs otherwise similar to previous Confirmed by Charlesetta Shanks [phone]) on 03/20/2018 4:47:04 PM   Radiology Dg Chest Port 1 View  Result Date: 03/20/2018 CLINICAL DATA:  Shortness of breath. EXAM: PORTABLE CHEST 1 VIEW COMPARISON:  Chest x-ray dated March 13, 2018. FINDINGS: Stable cardiomegaly status post TAVR and CABG. The lungs remain hyperinflated with chronic interstitial changes. Progressive diffuse interstitial thickening throughout both lungs. No focal consolidation, pneumothorax, or large pleural effusion. No acute osseous abnormality. IMPRESSION: Worsening pulmonary interstitial edema. Electronically Signed   By: Titus Dubin M.D.   On: 03/20/2018 17:58    Procedures Procedures (including critical care time) CRITICAL CARE Performed by: Charlesetta Shanks   Total critical care time: 30  minutes  Critical care time was exclusive of separately billable procedures and treating other patients.  Critical care was necessary to treat or prevent imminent or life-threatening deterioration.  Critical care was time spent personally by me on the following activities: development of treatment plan with patient and/or surrogate as well as nursing, discussions with consultants, evaluation of patient's response to treatment, examination of  patient, obtaining history from patient or surrogate, ordering and performing treatments and interventions, ordering and review of laboratory studies, ordering and review of radiographic studies, pulse oximetry and re-evaluation of  patient's condition. Medications Ordered in ED Medications  furosemide (LASIX) injection 40 mg (has no administration in time range)  cefTRIAXone (ROCEPHIN) 1 g in sodium chloride 0.9 % 100 mL IVPB (has no administration in time range)     Initial Impression / Assessment and Plan / ED Course  I have reviewed the triage vital signs and the nursing notes.  Pertinent labs & imaging results that were available during my care of the patient were reviewed by me and considered in my medical decision making (see chart for details).  Clinical Course as of Mar 20 2006  Mon Mar 20, 2018  2006 Consult;Dr. Tamala Julian called back for admission.   [MP]    Clinical Course User Index [MP] Charlesetta Shanks, MD   Patient is objectively short of breath.  She does have increasing edema identified on chest x-ray and peripheral edema with gallop on cardiac exam..  She does have chronic heart failure but appears to be having a lifestyle limiting exacerbation.  Patient was recently admitted but denies feeling much improved at discharge.  Patient may need repeat echo cardiogram.  She has had aortic valve replacement and reports having done extremely well with that for the first several months.  Plan will be for admission to [facility] for ongoing treatment and diagnostic evaluation as needed.  Final Clinical Impressions(s) / ED Diagnoses   Final diagnoses:  Acute on chronic combined systolic and diastolic congestive heart failure ([place])  Acute cystitis with hematuria  Chronic atrial fibrillation    ED Discharge Orders    None       Charlesetta Shanks, MD 03/20/18 Earney Navy    Charlesetta Shanks, MD 03/20/18 2007

## 2018-03-20 NOTE — ED Notes (Signed)
Pt placed on 2L [place]. Will continue to monitor.

## 2018-03-20 NOTE — Plan of Care (Signed)
  Problem: Spiritual Needs Goal: Ability to function at adequate level Outcome: Progressing   Problem: Education: Goal: Knowledge of General Education information will improve Description Including pain rating scale, medication(s)/side effects and non-pharmacologic comfort measures Outcome: Progressing   Problem: Clinical Measurements: Goal: Ability to maintain clinical measurements within normal limits will improve Outcome: Progressing   Problem: Activity: Goal: Risk for activity intolerance will decrease Outcome: Progressing   Problem: Nutrition: Goal: Adequate nutrition will be maintained Outcome: Progressing   Problem: Safety: Goal: Ability to remain free from injury will improve Outcome: Progressing   Problem: Education: Goal: Ability to demonstrate management of disease process will improve Outcome: Progressing

## 2018-03-20 NOTE — ED Triage Notes (Signed)
Per EMS, pt from home. Pt was recently in hospital for same sob sx. Pt has had pursed lip respirations at rate of  30-40. Dyspnea with exertion. Pt + for MRSA, just started antibiotics this morning. Denies pain.   EMS VS 97% room air, hx of Afib HR 75-95. BP 150/80. Hx of diabetes, cbg 460.

## 2018-03-20 NOTE — ED Notes (Signed)
XR at bedside

## 2018-03-20 NOTE — H&P (Signed)
History and Physical    Kristina Summers QVZ:[id number] DOB: 02/21/1936 DOA: 03/20/2018  Referring MD/NP/PA: Tomasa Rand, MD PCP: Care, [facility]  Patient coming from: Home- lives alone  Chief Complaint: Shortness of breath  I have personally briefly reviewed patient's old medical records in [place]   HPI: Kristina Summers is a 82 y.o. female with medical history significant of DM type II, PVD, diastolic heart failure, CKD, CAD s/p bypass grafting, and aortic stenosis s/p TAVR 11/08/2017; who presents with complaints of worsening shortness of breath and elevated sugar. Patient had just recently been hospitalized from 11/25-11/30; acute on chronic diastolic heart failure exacerbation complicated by symptomatic anemia. Patient's initial hemoglobin was noted to be as low as 6.7 for which she received 2 units of packed red blood cells and furosemide.  Patient underwent capsule endoscopy which was noted to be normal.  Since being home patient reports that she has not felt well.  She complains that she has been very short of breath and has difficulty ambulating even short distances.  She ambulates with use of a rolling walker and lives alone.  Patient reports that she has had leg swelling, orthopnea, dry cough, malaise, right-sided flank pain, and urinary frequency.  She denies noted any blood in stools, dysuria, or fever.  She has been taking Eliquis and all other medications as prescribed. However, reports that her blood sugars have been elevated.    She has had previous hospitalizations in 08/2019for MRSA bacteremia, and09/2019for acute anemia requiring 3 units of packed red blood cell transfusion.   ED Course: Admission to the emergency department patient was noted to be afebrile, respirations 15-30, blood pressure 151/93-168/70, O2 saturations maintained 96 to 100% on room air.  Labs revealed WBC 9.7, hemoglobin 8.2, sodium 138, CO2 18, BUN 62, creatinine 1.97, glucose 359, anion gap 10, BNP  1119.8, and trop 0.05.  Chest x-ray showed worsening interstitial edema.  UA revealed moderate leukocytes, many bacteria, >500 glucose, 0-5 squamous epithelial cells, and 11-20 WBCs.  Patient was given 40 mg of Lasix IV and 1 dose of Rocephin.  TRH called to admit.  Review of Systems  Constitutional: Positive for malaise/fatigue. Negative for fever.  HENT: Negative for ear discharge and tinnitus.   Respiratory: Positive for cough and shortness of breath. Negative for sputum production.   Cardiovascular: Positive for orthopnea and leg swelling. Negative for chest pain.  Genitourinary: Positive for flank pain and frequency.    Past Medical History:  Diagnosis Date  . Anemia   . CAD (coronary artery disease)    a. s/p CABG in 2000  . Carotid artery occlusion   . CKD (chronic kidney disease)   . Diastolic CHF ([place]) 32/9518  . Fibromyalgia   . GERD (gastroesophageal reflux disease)   . History of CVA (cerebrovascular accident) 05/24/2014  . History of GI bleed   . Hyperlipidemia   . Hypertension   . Obesity   . PAF (paroxysmal atrial fibrillation) (HCC)    a. on Eliquis  . Peripheral vascular disease ([place])   . 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 Type II diabetes mellitus ([place]) dx'd 1979    Past Surgical History:  Procedure Laterality Date  . ABDOMINAL AORTAGRAM N/A 04/01/2011   Procedure: ABDOMINAL AORTAGRAM;  Surgeon: Conrad [place], MD;  Location: [facility] C[facility] LAB;  Service: Cardiovascular;  Laterality: N/A;  . ANGIOPLASTY  06/17/11  Left leg common femoral artery cannulation under u/s Left leg runoff  . CARDIAC CATHETERIZATION  10/11/2017  . CARPAL TUNNEL RELEASE Right   . CATARACT EXTRACTION W/ INTRAOCULAR LENS  IMPLANT, BILATERAL  2004-2005  . CORONARY ARTERY BYPASS GRAFT  2000   CABG X5  . ESOPHAGOGASTRODUODENOSCOPY (EGD) WITH PROPOFOL N/A 12/25/2017    Procedure: ESOPHAGOGASTRODUODENOSCOPY (EGD) WITH PROPOFOL;  Surgeon: Wonda Horner, MD;  Location: [facility] ENDOSCOPY;  Service: Endoscopy;  Laterality: N/A;  . EYE SURGERY Left    "lasered before cataract OR"  . GIVENS CAPSULE STUDY  03/14/2018  . GIVENS CAPSULE STUDY N/A 03/14/2018   Procedure: GIVENS CAPSULE STUDY;  Surgeon: Ronald Lobo, MD;  Location: [place];  Service: Endoscopy;  Laterality: N/A;  . LOWER EXTREMITY ANGIOGRAM Bilateral 04/01/2011   Procedure: LOWER EXTREMITY ANGIOGRAM;  Surgeon: Conrad Holland, MD;  Location: [facility] CATH LAB;  Service: Cardiovascular;  Laterality: Bilateral;  . LOWER EXTREMITY ANGIOGRAM Left 06/17/2011   Procedure: LOWER EXTREMITY ANGIOGRAM;  Surgeon: Conrad Aberdeen, MD;  Location: [facility] CATH LAB;  Service: Cardiovascular;  Laterality: Left;  . LOWER EXTREMITY ANGIOGRAM N/A 11/18/2011   Procedure: LOWER EXTREMITY ANGIOGRAM;  Surgeon: Conrad Edmondson, MD;  Location: [facility] CATH LAB;  Service: Cardiovascular;  Laterality: N/A;  . PERCUTANEOUS STENT INTERVENTION Right 04/01/2011   Procedure: PERCUTANEOUS STENT INTERVENTION;  Surgeon: Conrad Summerfield, MD;  Location: [facility] CATH LAB;  Service: Cardiovascular;  Laterality: Right;  rt iliac stent  . PERIPHERAL ARTERIAL STENT GRAFT  04/01/11   right common iliac  . RIGHT/LEFT HEART CATH AND CORONARY/GRAFT ANGIOGRAPHY N/A 10/11/2017   Procedure: RIGHT/LEFT HEART CATH AND CORONARY/GRAFT ANGIOGRAPHY;  Surgeon: Sherren Mocha, MD;  Location: [place] CV LAB;  Service: Cardiovascular;  Laterality: N/A;  . TEE WITHOUT CARDIOVERSION N/A 11/08/2017   Procedure: TRANSESOPHAGEAL ECHOCARDIOGRAM (TEE);  Surgeon: Sherren Mocha, MD;  Location: [place];  Service: Open Heart Surgery;  Laterality: N/A;  . TEE WITHOUT CARDIOVERSION N/A 12/15/2017   Procedure: TRANSESOPHAGEAL ECHOCARDIOGRAM (TEE);  Surgeon: Larey Dresser, MD;  Location: [facility] ENDOSCOPY;  Service: Cardiovascular;  Laterality: N/A;  . TRANSCATHETER AORTIC VALVE REPLACEMENT, TRANSFEMORAL N/A  11/08/2017   Procedure: TRANSCATHETER AORTIC VALVE REPLACEMENT, TRANSFEMORAL;  Surgeon: Sherren Mocha, MD;  Location: [place];  Service: Open Heart Surgery;  Laterality: N/A;  . TRIGGER FINGER RELEASE Left 1996   thumb     reports that she quit smoking about 27 years ago. Her smoking use included cigarettes. She has a 60.00 pack-year smoking history. She has never used smokeless tobacco. She reports that she drank alcohol. She reports that she does not use drugs.  Allergies  Allergen Reactions  . Morphine And Related Nausea And Vomiting and Other (See Comments)    Chest pain, also   . Lisinopril Cough  . Zoloft [Sertraline Hcl] Nausea Only         Family History  Problem Relation Age of Onset  . Other Brother        intestinal blockage  . Diabetes Brother     Prior to Admission medications   Medication Sig Start Date End Date Taking? Authorizing Provider  acetaminophen (TYLENOL) 500 MG tablet Take 500 mg by mouth daily as needed for moderate pain or headache.    [provider]  amLODipine (NORVASC) 10 MG tablet Take 10 mg by mouth daily.     [provider]  apixaban (ELIQUIS) 5 MG TABS tablet Take 1 tablet (5 mg total) by mouth 2 (two) times daily. 12/26/17  Shelly Coss, MD  atorvastatin (LIPITOR) 80 MG tablet Take 1 tablet (80 mg total) by mouth daily. Patient taking differently: Take 80 mg by mouth daily at 6 PM.  11/27/12   Baker, Freeman Caldron, PA-C  furosemide (LASIX) 40 MG tablet Take 40 mg by mouth 3 (three) times daily.  06/30/17   [provider]  glucose 4 GM chewable tablet Chew 1-4 tablets by mouth once as needed for low blood sugar.     [provider]  insulin NPH Human (HUMULIN N,NOVOLIN N) 100 UNIT/ML injection Inject 0.15 mLs (15 Units total) into the skin 2 (two) times daily. 15 units in the morning and 10 units in the PM Patient taking differently: Inject 10-15 Units into the skin See admin instructions. Take 15 units in the  morning and 10 units in the evening 10/12/17   Eileen Stanford, PA-C  insulin regular (NOVOLIN R,HUMULIN R) 100 units/mL injection Inject 5 Units into the skin 2 (two) times daily before a meal.     [provider]  losartan (COZAAR) 50 MG tablet Take 1 tablet (50 mg total) by mouth daily. 11/22/17   Eileen Stanford, PA-C  metoprolol tartrate (LOPRESSOR) 25 MG tablet Take 1 tablet (25 mg total) by mouth 2 (two) times daily. 03/18/18 04/17/18  Arrien, Jimmy Picket, MD  Multiple Vitamin (MULTIVITAMIN WITH MINERALS) TABS tablet Take 1 tablet by mouth daily. One a Day 50 plus    [provider]  nitroGLYCERIN (NITROSTAT) 0.4 MG SL tablet Place 0.4 mg under the tongue every 5 (five) minutes as needed for chest pain.    [provider]  pantoprazole (PROTONIX) 40 MG tablet Take 1 tablet (40 mg total) by mouth daily for 30 doses. 03/18/18 04/17/18  Arrien, Jimmy Picket, MD  senna (SENOKOT) 8.6 MG TABS tablet Take 1 tablet by mouth as needed for mild constipation.    [provider]    Physical Exam:  Constitutional: Elderly female who appears to be in some respiratory distress Vitals:   03/20/18 1845 03/20/18 1915 03/20/18 1930 03/20/18 1945  BP: (!) 161/94 (!) 164/63 (!) 155/66 (!) 151/93  Pulse: 91 83 87 89  Resp: 19 17 18 18   Temp:      TempSrc:      SpO2: 99% 100% 99% 100%  Weight:      Height:       Eyes: PERRL, lids and conjunctivae normal ENMT: Mucous membranes are moist. Posterior pharynx clear of any exudate or lesions.  Neck: normal, supple, no masses, no thyromegaly.  Positive JVD. Respiratory: Labored breathing speaking in short 1-2 word sentences.  Patient on 2 L of nasal cannula oxygen. Cardiovascular: Irregularly irregular, no murmurs / rubs / gallops. +2 pitting lower extremity edema. 2+ pedal pulses. No carotid bruits.  Abdomen:  No masses palpated. No hepatosplenomegaly. Bowel sounds positive.  Positive right-sided CVA  tenderness. Musculoskeletal: no clubbing / cyanosis. No joint deformity upper and lower extremities. Good ROM, no contractures. Normal muscle tone.  Skin: Pallor present.  No rashes, lesions, ulcers. No induration Neurologic: CN 2-12 grossly intact. Sensation intact, DTR normal. Strength 5/5 in all 4.  Psychiatric: Normal judgment and insight. Alert and oriented x 3. Normal mood.     Labs on Admission: I have personally reviewed following labs and imaging studies  CBC: Recent Labs  Lab 03/14/18 1926 03/15/18 0548 03/15/18 1905 03/17/18 0454 03/18/18 0402 03/20/18 1721  WBC 11.2* 10.8* 9.5  --  10.1 9.7  NEUTROABS 9.5*  8.7* 7.4  --  8.3* 7.8*  HGB 9.7* 9.3* 9.2* 7.9* 8.6* 8.2*  HCT 31.6* 30.3* 29.7* 26.7* 28.9* 28.2*  MCV 84.5 85.4 85.1  --  85.8 87.3  PLT 287 273 265  --  276 979   Basic Metabolic Panel: Recent Labs  Lab 03/15/18 0548 03/16/18 0543 03/17/18 0454 03/18/18 0402 03/20/18 1721  NA 132* 131* 132* 133* 130*  K 3.7 3.8 4.2 3.5 3.9  CL 106 107 107 104 102  CO2 18* 16* 20* 19* 18*  GLUCOSE 241* 161* 238* 205* 359*  BUN 49* 63* 79* 73* 62*  CREATININE 2.01* 2.26* 2.69* 2.25* 1.97*  CALCIUM 7.9* 7.9* 8.3* 8.4* 8.3*  MG  --   --   --   --  2.0  PHOS  --   --   --   --  3.0   GFR: Estimated Creatinine Clearance: 25.1 mL/min (A) (by C-G formula based on SCr of 1.97 mg/dL (H)). Liver Function Tests: Recent Labs  Lab 03/20/18 1721  AST 52*  ALT 32  ALKPHOS 118  BILITOT 1.0  PROT 7.6  ALBUMIN 1.6*   Recent Labs  Lab 03/20/18 1721  LIPASE 22   No results for input(s): AMMONIA in the last 168 hours. Coagulation Profile: Recent Labs  Lab 03/20/18 1721  INR 1.24   Cardiac Enzymes: No results for input(s): CKTOTAL, CKMB, CKMBINDEX, TROPONINI in the last 168 hours. BNP (last 3 results) No results for input(s): PROBNP in the last 8760 hours. HbA1C: No results for input(s): HGBA1C in the last 72 hours. CBG: Recent Labs  Lab 03/17/18 0719  03/17/18 1204 03/17/18 1711 03/17/18 2103 03/18/18 0801  GLUCAP 203* 203* 225* 184* 201*   Lipid Profile: No results for input(s): CHOL, HDL, LDLCALC, TRIG, CHOLHDL, LDLDIRECT in the last 72 hours. Thyroid Function Tests: Recent Labs    03/20/18 1721  TSH 1.730   Anemia Panel: No results for input(s): VITAMINB12, FOLATE, FERRITIN, TIBC, IRON, RETICCTPCT in the last 72 hours. Urine analysis:    Component Value Date/Time   COLORURINE YELLOW 03/20/2018 1721   APPEARANCEUR HAZY (A) 03/20/2018 1721   LABSPEC 1.013 03/20/2018 1721   PHURINE 6.0 03/20/2018 1721   GLUCOSEU >=500 (A) 03/20/2018 1721   HGBUR LARGE (A) 03/20/2018 1721   BILIRUBINUR NEGATIVE 03/20/2018 1721   KETONESUR NEGATIVE 03/20/2018 1721   PROTEINUR 100 (A) 03/20/2018 1721   UROBILINOGEN 1.0 05/29/2014 1052   NITRITE NEGATIVE 03/20/2018 1721   LEUKOCYTESUR MODERATE (A) 03/20/2018 1721   Sepsis Labs: Recent Results (from the past 240 hour(s))  Blood Cultures x 2 sites     Status: None   Collection Time: 03/13/18  3:45 PM  Result Value Ref Range Status   Specimen Description BLOOD RIGHT ANTECUBITAL  Final   Special Requests   Final    BOTTLES DRAWN AEROBIC AND ANAEROBIC Blood Culture adequate volume   Culture   Final    NO GROWTH 5 DAYS Performed at [facility], [place] [address] [place], [place] [address]    Report Status 03/18/2018 FINAL  Final  Blood Cultures x 2 sites     Status: None   Collection Time: 03/13/18  3:51 PM  Result Value Ref Range Status   Specimen Description BLOOD RIGHT FOREARM  Final   Special Requests   Final    BOTTLES DRAWN AEROBIC AND ANAEROBIC Blood Culture adequate volume   Culture   Final    NO GROWTH 5 DAYS Performed at [facility]  [facility], [place] [address] [place], [place] [address]    Report Status 03/18/2018 FINAL  Final  MRSA PCR Screening     Status: Abnormal   Collection Time: 03/13/18  5:59 PM  Result Value Ref Range Status   MRSA by PCR POSITIVE (A) NEGATIVE  Final    Comment:        The GeneXpert MRSA Assay (FDA approved for NASAL specimens only), is one component of a comprehensive MRSA colonization surveillance program. It is not intended to diagnose MRSA infection nor to guide or monitor treatment for MRSA infections. RESULT CALLED TO, READ BACK BY AND VERIFIED WITH: V.GYEKYE,RN AT 2050 BY L.PITT 03/12/18      Radiological Exams on Admission: Dg Chest Port 1 View  Result Date: 03/20/2018 CLINICAL DATA:  Shortness of breath. EXAM: PORTABLE CHEST 1 VIEW COMPARISON:  Chest x-ray dated March 13, 2018. FINDINGS: Stable cardiomegaly status post TAVR and CABG. The lungs remain hyperinflated with chronic interstitial changes. Progressive diffuse interstitial thickening throughout both lungs. No focal consolidation, pneumothorax, or large pleural effusion. No acute osseous abnormality. IMPRESSION: Worsening pulmonary interstitial edema. Electronically Signed   By: Titus Dubin M.D.   On: 03/20/2018 17:58    EKG: Independently reviewed.  Atrial fibrillation at 85 bpm  Assessment/Plan Diastolic congestive heart failure exacerbation: Acute on chronic.  Patient presents with worsening shortness of breath and lower extremity edema.  Chest x-ray showing worsening interstitial edema.  BNP elevated at 1119.8.  Last EF noted to be 55 to 60%. Followed by Dr. [name]. - Admit to a telemetry bed - Heart failure orders set  initiated  - Continuous pulse oximetry with nasal cannula oxygen as needed to keep O2 saturations >92% - Strict I&Os and daily weights - Elevate lower extremities - Trend troponin - Lasix 40 mg IV Bid - Reassess in a.m. and adjust diuresis as needed. - Discussed with cardiology to determine if echo warranted - Optimize medical management as able - Message sent for cardiology to evaluate in a.m.  Hypochromic anemia/ GI bleeding: Patient noted to have hemoglobin 8.2 on admission down from 8.6 on 11/30.  Patient had previous  positive stool guaiac during last hospitalization, but capsule endoscopy was noted to be normal. - Type and screen for possible need of blood products - Recheck stool guaiac - Consider need of consulting GI in a.m.  Urinary tract infection/pyelonephritis: Acute.  Patient presents with moderate leukocytes, many bacteria, and signs of infection on urinalysis.  Patient had been given Rocephin in the emergency department. - Follow-up urine culture - Continue Rocephin  Diabetes Mellitus type II with hyperglycemia: Last hemoglobin A1c noted to be 6 on 11/04/2017. Suspect infection as seen above likely causing rise in blood glucose. - Hypoglycemic protocol - CBGs q. before meals and at bedtime  Possible  acute on chronic kidney disease stage III/IV: Patient presents with creatinine 1.97 previously noted to be as high as 2.69 during last hospitalization.  Baseline previously noted to be around 1-1.3. - Continue to monitor  Atrial fibrillation on chronic anticoagulation: Chronic. - Continue metoprolol and Eliquis  Aortic stenosis s/p TAVR in 10/2017.    Essential hypertension - Continue amlodipine and metoprolol - Will need to continue losartan when medically appropriate  Dyslipidemia - Continue atorvastatin  GERD - Continue protonix  DVT prophylaxis: Eliquis   Code Status: DNR Family Communication: No family present at bedside Disposition Plan: [place] discharge home in 2-3 days Consults called: None Admission status: inpatient due to congestive heart failure  exacerbation and urinary tract infection   Norval Morton MD [facility] Pager [phone]   If 7PM-7AM, please contact night-coverage [email or web address] Password [email or web address]  03/20/2018, 8:16 PM

## 2018-03-21 DIAGNOSIS — Z952 Presence of prosthetic heart valve: Secondary | ICD-10-CM

## 2018-03-21 DIAGNOSIS — I482 Chronic atrial fibrillation, unspecified: Secondary | ICD-10-CM

## 2018-03-21 DIAGNOSIS — I5043 Acute on chronic combined systolic (congestive) and diastolic (congestive) heart failure: Secondary | ICD-10-CM

## 2018-03-21 DIAGNOSIS — D649 Anemia, unspecified: Secondary | ICD-10-CM

## 2018-03-21 LAB — PREPARE RBC (CROSSMATCH)

## 2018-03-21 LAB — CBC WITH DIFFERENTIAL/PLATELET
Abs Immature Granulocytes: 0.04 10*3/uL (ref 0.00–0.07)
Basophils Absolute: 0.1 10*3/uL (ref 0.0–0.1)
Basophils Relative: 1 %
Eosinophils Absolute: 0.5 10*3/uL (ref 0.0–0.5)
Eosinophils Relative: 5 %
HCT: 25.3 % — ABNORMAL LOW (ref 36.0–46.0)
Hemoglobin: 7.7 g/dL — ABNORMAL LOW (ref 12.0–15.0)
IMMATURE GRANULOCYTES: 0 %
Lymphocytes Relative: 7 %
Lymphs Abs: 0.7 10*3/uL (ref 0.7–4.0)
MCH: 25.6 pg — ABNORMAL LOW (ref 26.0–34.0)
MCHC: 30.4 g/dL (ref 30.0–36.0)
MCV: 84.1 fL (ref 80.0–100.0)
Monocytes Absolute: 0.8 10*3/uL (ref 0.1–1.0)
Monocytes Relative: 8 %
Neutro Abs: 7.5 10*3/uL (ref 1.7–7.7)
Neutrophils Relative %: 79 %
Platelets: 270 10*3/uL (ref 150–400)
RBC: 3.01 MIL/uL — ABNORMAL LOW (ref 3.87–5.11)
RDW: 17.5 % — ABNORMAL HIGH (ref 11.5–15.5)
WBC: 9.6 10*3/uL (ref 4.0–10.5)
nRBC: 0 % (ref 0.0–0.2)

## 2018-03-21 LAB — HEMOGLOBIN AND HEMATOCRIT, BLOOD
HCT: 30.2 % — ABNORMAL LOW (ref 36.0–46.0)
Hemoglobin: 9.1 g/dL — ABNORMAL LOW (ref 12.0–15.0)

## 2018-03-21 LAB — BASIC METABOLIC PANEL
ANION GAP: 9 (ref 5–15)
BUN: 57 mg/dL — ABNORMAL HIGH (ref 8–23)
CO2: 19 mmol/L — ABNORMAL LOW (ref 22–32)
Calcium: 8.5 mg/dL — ABNORMAL LOW (ref 8.9–10.3)
Chloride: 108 mmol/L (ref 98–111)
Creatinine, Ser: 1.82 mg/dL — ABNORMAL HIGH (ref 0.44–1.00)
GFR calc non Af Amer: 26 mL/min — ABNORMAL LOW (ref >60)
GFR, EST AFRICAN AMERICAN: 30 mL/min — AB (ref >60)
Glucose, Bld: 147 mg/dL — ABNORMAL HIGH (ref 70–99)
Potassium: 3.7 mmol/L (ref 3.5–5.1)
Sodium: 136 mmol/L (ref 135–145)

## 2018-03-21 LAB — RETICULOCYTES
Immature Retic Fract: 18.2 % — ABNORMAL HIGH (ref 2.3–15.9)
RBC.: 3.01 MIL/uL — AB (ref 3.87–5.11)
Retic Count, Absolute: 41.2 10*3/uL (ref 19.0–186.0)
Retic Ct Pct: 1.4 % (ref 0.4–3.1)

## 2018-03-21 LAB — IRON AND TIBC
Iron: 19 ug/dL — ABNORMAL LOW (ref 28–170)
Saturation Ratios: 9 % — ABNORMAL LOW (ref 10.4–31.8)
TIBC: 211 ug/dL — ABNORMAL LOW (ref 250–450)
UIBC: 192 ug/dL

## 2018-03-21 LAB — GLUCOSE, CAPILLARY
GLUCOSE-CAPILLARY: 91 mg/dL (ref 70–99)
Glucose-Capillary: 149 mg/dL — ABNORMAL HIGH (ref 70–99)
Glucose-Capillary: 242 mg/dL — ABNORMAL HIGH (ref 70–99)
Glucose-Capillary: 279 mg/dL — ABNORMAL HIGH (ref 70–99)

## 2018-03-21 LAB — FOLATE: Folate: 23.8 ng/mL (ref >5.9)

## 2018-03-21 LAB — TROPONIN I
Troponin I: 0.06 ng/mL (ref <0.03)
Troponin I: 0.07 ng/mL (ref <0.03)

## 2018-03-21 LAB — VITAMIN B12: Vitamin B-12: 852 pg/mL (ref 180–914)

## 2018-03-21 LAB — FERRITIN: Ferritin: 104 ng/mL (ref 11–307)

## 2018-03-21 MED ORDER — SODIUM CHLORIDE 0.9% IV SOLUTION
Freq: Once | INTRAVENOUS | Status: AC
  Administered 2018-03-21: 13:00:00 via INTRAVENOUS

## 2018-03-21 MED ORDER — FUROSEMIDE 10 MG/ML IJ SOLN
40.0000 mg | Freq: Three times a day (TID) | INTRAMUSCULAR | Status: DC
  Administered 2018-03-21 – 2018-03-23 (×5): 40 mg via INTRAVENOUS
  Filled 2018-03-21 (×5): qty 4

## 2018-03-21 NOTE — Progress Notes (Signed)
Initial Nutrition Assessment  Late Entry for March 29, 2018  DOCUMENTATION CODES:   Not applicable  INTERVENTION:   -D/c Ensure Enlive po BID, each supplement provides 350 kcal and 20 grams of protein -MVI with minerals daily -1 packet Juven BID, each packet provides 80 calories, 8 grams of carbohydrate, and 14 grams of amino acids; supplement contains CaHMB, glutamine, and arginine, to promote wound healing  NUTRITION DIAGNOSIS:   Increased nutrient needs related to wound healing as evidenced by estimated needs.  GOAL:   Patient will meet greater than or equal to 90% of their needs  MONITOR:   PO intake, Supplement acceptance, Labs, Weight trends, Skin, I & O's  REASON FOR ASSESSMENT:   Malnutrition Screening Tool    ASSESSMENT:    Kristina Summers is a 82 y.o. female with medical history significant of DM type II, PVD, diastolic heart failure, CKD, CAD s/p bypass grafting, and aortic stenosis s/p TAVR 11/08/2017; who presents with complaints of worsening shortness of breath and elevated sugar. Patient had just recently been hospitalized from 11/25-11/30; acute on chronic diastolic heart failure exacerbation complicated by symptomatic anemia.  Pt admitted with CHF exacerbation.  Pt familiar to this RD due to recent prior admission. Noted most recent hospital discharge on 03/18/18.   Pt resting quietly at time of visit. Pt with good appetite. PTA, pt consumed 3 meals per day (Breakfast: coffee, 2 slices of wheat toast, eggs, bacon, and orange juice; Lunch: homemade lentil soup; Dinner: two egg rolls).   Wt has remained stable.   Pt with ongoing DM foot wound and is seeking outpatient podiatry assistance for this.   Last Hgb A1c: 6.0 (11/04/17). PTA DM medications are 15 units insulin NPH q AM, 10 units insulin NPH q PM, and 5 units insulin regular BID.   Labs reviewed: CBGS: 151-279 (inpatient orders for glycemic control are 0-5 units insulin aspart q HS, 5 units insulin aspart BID,  10 units insulin NPH q HS, 15 units insulin NPH q AM).   NUTRITION - FOCUSED PHYSICAL EXAM:    Most Recent Value  Orbital Region  No depletion  Upper Arm Region  No depletion  Thoracic and Lumbar Region  No depletion  Buccal Region  No depletion  Temple Region  No depletion  Clavicle Bone Region  No depletion  Clavicle and Acromion Bone Region  No depletion  Scapular Bone Region  No depletion  Dorsal Hand  No depletion  Patellar Region  No depletion  Anterior Thigh Region  No depletion  Posterior Calf Region  No depletion  Edema (RD Assessment)  Mild  Hair  Reviewed  Eyes  Reviewed  Mouth  Reviewed  Skin  Reviewed  Nails  Reviewed       Diet Order:   Diet Order            Diet heart healthy/carb modified Room service appropriate? Yes; Fluid consistency: Thin  Diet effective now              EDUCATION NEEDS:   No education needs have been identified at this time  Skin:  Skin Integrity Issues:: Diabetic Ulcer Diabetic Ulcer: DM ulcer on rt 5th metatarsal head  Last BM:  2018-03-29  Height:   Ht Readings from Last 1 Encounters:  03/20/18 5\' 6"  (1.676 m)    Weight:   Wt Readings from Last 1 Encounters:  03/22/18 81.4 kg    Ideal Body Weight:  59.1 kg  BMI:  Body mass index is 28.96 kg/m.  Estimated Nutritional Needs:   Kcal:  1600-1800  Protein:  80-95 grams  Fluid:  1.6-1.8 L    Kristina Summers, RD, LDN, CDE Pager: [phone] After hours Pager: [phone]

## 2018-03-21 NOTE — Plan of Care (Signed)
  Problem: Spiritual Needs Goal: Ability to function at adequate level Outcome: Progressing   Problem: Education: Goal: Knowledge of General Education information will improve Description Including pain rating scale, medication(s)/side effects and non-pharmacologic comfort measures Outcome: Progressing   Problem: Activity: Goal: Risk for activity intolerance will decrease Outcome: Progressing   Problem: Skin Integrity: Goal: Risk for impaired skin integrity will decrease Outcome: Progressing   Problem: Education: Goal: Ability to demonstrate management of disease process will improve Outcome: Progressing

## 2018-03-21 NOTE — Progress Notes (Signed)
   03/21/18 1300  Clinical Encounter Type  Visited With Patient  Visit Type Initial  Referral From Physician  Consult/Referral To Chaplain  Chaplain responded to spiritual care consult for Pt. prayer.  The chaplain was informed from Pt. RN the Pt. was preparing to receive blood in the near future.  The chaplain was greeted by the Pt. smile and willingness to pray before her procedure.  The Pt. shared her faith community is [facility] on [address].  With permission from the Pt., the chaplain phoned the church office.  The chaplain shared the Pt. admission to [facility].  Hilda Blades received the call and will pass on the information to the parish volunteers.

## 2018-03-21 NOTE — Progress Notes (Signed)
PROGRESS NOTE  Kristina Summers YBO:[id number] DOB: 1935/09/01 DOA: 03/20/2018 PCP: [facility]   LOS: 1 day   Brief Narrative / Interim history: 82 year old female with history of DM-2, diastolic CHF, CKD 4, CAD status post CABG in 2000, AS status post TVAR in 10/2017, PVD with multiple lower extremity angiogram and stent, A. fib on Eliquis and GI bleed status post recent EGD and capsule endoscopy admitted with worsening dyspnea thought to be due to diastolic CHF exacerbation.  Chest x-ray with worsening pulmonary interstitial edema.  BNP elevated to 1119 but less than baseline.  Mild non-ACS pattern troponin elevation.  Also concern for UTI/pyelonephritis based on urinalysis but patient without symptoms.  Hemoglobin 8.2 on admission which is about baseline but trended to 7.7 the next day.    Subjective: No major events overnight.  No complaints this morning.  Denies urinary symptoms.  Reports feeling short of breath by walking from her bed to her door at home.  Followed by cardiology.  Baseline 2 pillow orthopnea.  Not able to tell me about hematochezia or melena due to visual impairment.  Assessment & Plan: Principal Problem:   Acute on chronic congestive heart failure (HCC) Active Problems:   Diabetes mellitus type 2 with peripheral artery disease (HCC)   Essential hypertension   Type II diabetes mellitus with renal manifestations (HCC)   Hypochromic anemia   S/P TAVR (transcatheter aortic valve replacement)  Acute on chronic diastolic CHF: Echo in 05/7780 with EF of 55%, moderately dilated LAE and a small PFO but no other significant abnormality.  Appears euvolemic.  Responding well to Lasix.  Renal function improving. -Continue IV Lasix 40 mg twice daily. -Fluid restriction per protocol. -Monitor daily weight, intake output and renal function. -Doubt need for echo at this time. -Manage anemia as below. -PT eval  Wt Readings from Last 10 Encounters:  03/21/18 82.7 kg  03/18/18  83.1 kg  03/07/18 85.7 kg  02/07/18 87 kg  01/26/18 86.6 kg  01/16/18 86.1 kg  12/28/17 86.2 kg  12/25/17 87.7 kg  12/11/17 83.9 kg  11/17/17 87.7 kg   Symptomatic blood loss anemia:  Hemoglobin 8.2 on admission and trended down to 7.7.  It was 8.6 on discharge on 11/30 after 2 units of blood transfusion.  Had extensive GI work-up at her recent hospitalization.  Anemia panel with iron saturation to 9%. -Transfuse 1 unit PRBC -May need iron infusion as well. -Posttransfusion H&H -I would not hold her Eliquis at this time unless there is a significant drop in hemoglobin. -Hold on GI consult for now.  CAD status post CABG in 2000: Currently without chest pain.  Mild non-ACS pattern troponin elevation likely demand ischemia.  Also unreliable due to CKD. -Continue home beta-blocker, statin and PRN nitro.  A. Fib: Not in RVR.  Chads 2 Vasc 5 to 6. -Continue metoprolol and Eliquis.  AS status post TVAR in 10/2017  Asymptomatic bacteriuria: patient denies UTI symptoms. -Discontinue antibiotics  CKD 3: Mild creatinine elevation -Continue trending.  DM2-CBG within range. -continue basal, mealtime and sliding scale insulin and adjust as appropriate. -Monitor CBG.  Hypertension/hyperlipidemia: Stable. -Continue home meds.  Scheduled Meds: . sodium chloride   Intravenous Once  . amLODipine  10 mg Oral Daily  . apixaban  5 mg Oral BID  . atorvastatin  80 mg Oral q1800  . feeding supplement (ENSURE ENLIVE)  237 mL Oral BID BM  . furosemide  40 mg Intravenous BID  . insulin aspart  0-5 Units  Subcutaneous QHS  . insulin aspart  5 Units Subcutaneous BID WC  . insulin NPH Human  10 Units Subcutaneous QHS  . insulin NPH Human  15 Units Subcutaneous QAC breakfast  . metoprolol tartrate  25 mg Oral BID  . multivitamin with minerals  1 tablet Oral Daily  . pantoprazole  40 mg Oral Daily  . sodium chloride flush  3 mL Intravenous Q12H   Continuous Infusions: . sodium chloride    .  cefTRIAXone (ROCEPHIN)  IV     PRN Meds:.sodium chloride, acetaminophen, ondansetron (ZOFRAN) IV, sodium chloride flush  DVT prophylaxis: On Eliquis Code Status: DNR Family Communication: No family member at bedside. Disposition Plan: Remains inpatient for adequate diuresis and management of symptomatic anemia.  Significant cardiac comorbidities and high risk for deconditioning.  Consultants:   none  Procedures:   None  Antimicrobials:  Ceftriaxone 12/2 to 12/3  Objective: Vitals:   03/21/18 0032 03/21/18 0403 03/21/18 0845 03/21/18 0859  BP:  114/67 (!) 137/59   Pulse:  82 96   Resp:  20 20   Temp:  99.1 F (37.3 C)  (!) 97.5 F (36.4 C)  TempSrc:  Oral  Oral  SpO2:  98% 96%   Weight: 82.7 kg     Height:        Intake/Output Summary (Last 24 hours) at 03/21/2018 1139 Last data filed at 03/21/2018 1123 Gross per 24 hour  Intake 340 ml  Output 1525 ml  Net -1185 ml   Filed Weights   03/20/18 1639 03/21/18 0032  Weight: 85.3 kg 82.7 kg    Examination:  GENERAL: Appears well. No acute distress.  Sitting on the edge of the bed eating breakfast. HEENT: MMM.  Some visual impairment.  Hearing grossly intact.  NECK: Supple.  No JVD.  LUNGS:  No IWOB. Good air movement. CTAB.  HEART:  RRR.  2/6 SEM over RUSB and LUSB. ABD: Bowel sounds present. Soft. Non tender.  MSK/EXT:   no edema bilaterally.  SKIN: no apparent skin lesion.  NEURO: Awake, alert and oriented appropriately.  No gross deficit.  PSYCH: Calm. Normal affect.   Data Reviewed: I have independently reviewed following labs and imaging studies   CBC: Recent Labs  Lab 03/15/18 0548 03/15/18 1905 03/17/18 0454 03/18/18 0402 03/20/18 1721 03/21/18 0648  WBC 10.8* 9.5  --  10.1 9.7 9.6  NEUTROABS 8.7* 7.4  --  8.3* 7.8* 7.5  HGB 9.3* 9.2* 7.9* 8.6* 8.2* 7.7*  HCT 30.3* 29.7* 26.7* 28.9* 28.2* 25.3*  MCV 85.4 85.1  --  85.8 87.3 84.1  PLT 273 265  --  276 284 244   Basic Metabolic  Panel: Recent Labs  Lab 03/16/18 0543 03/17/18 0454 03/18/18 0402 03/20/18 1721 03/21/18 0242  NA 131* 132* 133* 130* 136  K 3.8 4.2 3.5 3.9 3.7  CL 107 107 104 102 108  CO2 16* 20* 19* 18* 19*  GLUCOSE 161* 238* 205* 359* 147*  BUN 63* 79* 73* 62* 57*  CREATININE 2.26* 2.69* 2.25* 1.97* 1.82*  CALCIUM 7.9* 8.3* 8.4* 8.3* 8.5*  MG  --   --   --  2.0  --   PHOS  --   --   --  3.0  --    GFR: Estimated Creatinine Clearance: 26.7 mL/min (A) (by C-G formula based on SCr of 1.82 mg/dL (H)). Liver Function Tests: Recent Labs  Lab 03/20/18 1721  AST 52*  ALT 32  ALKPHOS 118  BILITOT 1.0  PROT 7.6  ALBUMIN 1.6*   Recent Labs  Lab 03/20/18 1721  LIPASE 22   No results for input(s): AMMONIA in the last 168 hours. Coagulation Profile: Recent Labs  Lab 03/20/18 1721  INR 1.24   Cardiac Enzymes: Recent Labs  Lab 03/20/18 2148 03/21/18 0242 03/21/18 0648  TROPONINI 0.05* 0.06* 0.07*   BNP (last 3 results) No results for input(s): PROBNP in the last 8760 hours. HbA1C: No results for input(s): HGBA1C in the last 72 hours. CBG: Recent Labs  Lab 03/17/18 2103 03/18/18 0801 03/20/18 2154 03/20/18 2312 03/21/18 0730  GLUCAP 184* 201* 260* 257* 91   Lipid Profile: No results for input(s): CHOL, HDL, LDLCALC, TRIG, CHOLHDL, LDLDIRECT in the last 72 hours. Thyroid Function Tests: Recent Labs    03/20/18 1721  TSH 1.730   Anemia Panel: Recent Labs    03/21/18 0754  VITAMINB12 852  FOLATE 23.8  FERRITIN 104  TIBC 211*  IRON 19*  RETICCTPCT 1.4   Urine analysis:    Component Value Date/Time   COLORURINE YELLOW 03/20/2018 1721   APPEARANCEUR HAZY (A) 03/20/2018 1721   LABSPEC 1.013 03/20/2018 1721   PHURINE 6.0 03/20/2018 1721   GLUCOSEU >=500 (A) 03/20/2018 1721   HGBUR LARGE (A) 03/20/2018 1721   BILIRUBINUR NEGATIVE 03/20/2018 1721   KETONESUR NEGATIVE 03/20/2018 1721   PROTEINUR 100 (A) 03/20/2018 1721   UROBILINOGEN 1.0 05/29/2014 1052    NITRITE NEGATIVE 03/20/2018 1721   LEUKOCYTESUR MODERATE (A) 03/20/2018 1721   Sepsis Labs: Invalid input(s): PROCALCITONIN, LACTICIDVEN  Recent Results (from the past 240 hour(s))  Blood Cultures x 2 sites     Status: None   Collection Time: 03/13/18  3:45 PM  Result Value Ref Range Status   Specimen Description BLOOD RIGHT ANTECUBITAL  Final   Special Requests   Final    BOTTLES DRAWN AEROBIC AND ANAEROBIC Blood Culture adequate volume   Culture   Final    NO GROWTH 5 DAYS Performed at [facility], [address] [place], [place] [address]    Report Status 03/18/2018 FINAL  Final  Blood Cultures x 2 sites     Status: None   Collection Time: 03/13/18  3:51 PM  Result Value Ref Range Status   Specimen Description BLOOD RIGHT FOREARM  Final   Special Requests   Final    BOTTLES DRAWN AEROBIC AND ANAEROBIC Blood Culture adequate volume   Culture   Final    NO GROWTH 5 DAYS Performed at [facility], [place] [address], [place], [place] [address]    Report Status 03/18/2018 FINAL  Final  MRSA PCR Screening     Status: Abnormal   Collection Time: 03/13/18  5:59 PM  Result Value Ref Range Status   MRSA by PCR POSITIVE (A) NEGATIVE Final    Comment:        The GeneXpert MRSA Assay (FDA approved for NASAL specimens only), is one component of a comprehensive MRSA colonization surveillance program. It is not intended to diagnose MRSA infection nor to guide or monitor treatment for MRSA infections. RESULT CALLED TO, READ BACK BY AND VERIFIED WITH: [email or web address],RN AT 2050 BY L.PITT 03/12/18       Radiology Studies: Dg Chest Port 1 View  Result Date: 03/20/2018 CLINICAL DATA:  Shortness of breath. EXAM: PORTABLE CHEST 1 VIEW COMPARISON:  Chest x-ray dated March 13, 2018. FINDINGS: Stable cardiomegaly status post TAVR and CABG. The lungs remain hyperinflated with chronic interstitial changes. Progressive diffuse  interstitial thickening throughout both lungs. No focal  consolidation, pneumothorax, or large pleural effusion. No acute osseous abnormality. IMPRESSION: Worsening pulmonary interstitial edema. Electronically Signed   By: Titus Dubin M.D.   On: 03/20/2018 17:58   Ani Deoliveira T. [facility] Pager [phone]  If 7PM-7AM, please contact night-coverage [email or web address] Password [facility] 03/21/2018, 11:39 AM

## 2018-03-21 NOTE — Plan of Care (Signed)
  Problem: Education: Goal: Knowledge of General Education information will improve Description: Including pain rating scale, medication(s)/side effects and non-pharmacologic comfort measures Outcome: Progressing   Problem: Health Behavior/Discharge Planning: Goal: Ability to manage health-related needs will improve Outcome: Progressing   Problem: Clinical Measurements: Goal: Respiratory complications will improve Outcome: Progressing   

## 2018-03-21 NOTE — Consult Note (Addendum)
Cardiology Consult    Patient ID: Kristina Summers MRN: [id number], DOB/AGE: 1936/01/09   Admit date: 03/20/2018 Date of Consult: 03/21/2018  Primary Physician: Care, [facility] Primary Cardiologist: Dr. [name]. Saw Dr. Agustin Cree on 02/07/2018 for follow-up of TAVR. Requesting Provider: Dr. Cyndia Skeeters.   Patient Profile    Kristina Summers is a 82 y.o. female with a history of CAD s/p CABG in 2000, severe aortic stenosis s/p recent TAVR in 10/2017,  chronic diastolic heart failure, paroxsymal atrial fibrillation on Eliquis, CVA in 2016, hypertension, hyperlipidemia, type 2 diabetes mellitus, GERD, fibromyalgia, CKD Stage IV, and anemia requiring multiple blood transfusions recently, who is being seen today for the evaluation of CHF at the request of Dr. Cyndia Skeeters.  History of Present Illness    Kristina Summers is a 82 year old female with the above history who is followed by Dr. [name]. Patient underwent TAVR on 11/08/2017 for severe aortic stenosis. Patient was admitted from 12/11/2017 to 12/16/2017 for evaluation of fever, dysuria, and confusion. She was found to have sepsis due to MRSA bacteremia and E. Coli UTI at that time. TEE at that time showed no endocarditis. Patient was treated with IV antibiotics. Patient re-admitted form 12/22/2017 to 12/26/2017 for anemia with hemoglobin of 6.6. She was transfused with 2 units of PRBCs. EGD showed no source of active bleeding. Patient presented to ED on 01/16/2018 for evaluation of shortness of breath with increased bilateral leg edema and orthopnea. Patient treated with IV Lasix with good response and was discharged in stable condition. Patient was recently admitted from 03/13/2018 to 03/18/2018 for acute on chronic diastolic heart failure and symptomatic anemia. She was treated with IV Lasix and received 2 units of PRBCs. She underwent capsule endoscopy which was normal.   Patient presented yesterday to [facility] ED for worsening shortness of breath. Patient reports  worsening shortness of breath on exertion, orthopnea, PND, and lower extremity for the past couple of months. After patient's TAVR in 10/2017, patient states she felt great and had no dypsnea with exertion for about 1 month and then her exertional symptoms began to return. Patient reports she didn't feel ready to go home on 03/18/2018 during hospitalization for acute on chronic CHF because she was still having difficulty breathing. Patient continued to have worsening shortness of breath with exertion as well as orthopnea at home. Patient was at community center yesterday when someone there noted she was significantly short of breath after walking a short distance. Patient also reports recent PND and lower extremity swelling. Patient denies any chest pain. She states she is aware that she is in atrial fibrillation and notes palpitations. She also reports some lightheadedness and dizziness which I suspect may be due to her anemia. She states she is unable to tell if she has hematuria or hematochezia because her vision is very poor at baseline. She denies any recent fever or illness.   Upon arrival to the ED, patient tachypneic but vitals stable. EKG showed atrial fibrillation, rate 85 bpm, with PVCs but no acute ischemic changes compared to prior tracings. I-stat troponin negative. Chest x-ray showed worsening pulmonary interstitial edema. BNP elevated at 1,119.8. WBC 9.7, Hgb 8.2, Plts 284. Na 130, K 3.9, Glucose 359, SCr 1.97. Urinalysis suggestive of UTI and urine culture positive for Klebsiella pneumoniae. Patient received IV Lasix 40mg  and one dose of IV Rocephin in the ED.   Currently, patient reports her shortness of breath has significantly improved.  Past Medical History   Past Medical  History:  Diagnosis Date  . Anemia   . CAD (coronary artery disease)    a. s/p CABG in 2000  . Carotid artery occlusion   . CKD (chronic kidney disease)   . Diastolic CHF ([place]) 11/1446  . Fibromyalgia   . GERD  (gastroesophageal reflux disease)   . History of CVA (cerebrovascular accident) 05/24/2014  . History of GI bleed   . Hyperlipidemia   . Hypertension   . Obesity   . PAF (paroxysmal atrial fibrillation) (HCC)    a. on Eliquis  . Peripheral vascular disease ([place])   . 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 Type II diabetes mellitus ([place]) dx'd 1979    Past Surgical History:  Procedure Laterality Date  . ABDOMINAL AORTAGRAM N/A 04/01/2011   Procedure: ABDOMINAL AORTAGRAM;  Surgeon: Conrad [place], MD;  Location: [facility] CATH LAB;  Service: Cardiovascular;  Laterality: N/A;  . ANGIOPLASTY  06/17/11   Left leg common femoral artery cannulation under u/s Left leg runoff  . CARDIAC CATHETERIZATION  10/11/2017  . CARPAL TUNNEL RELEASE Right   . CATARACT EXTRACTION W/ INTRAOCULAR LENS  IMPLANT, BILATERAL  2004-2005  . CORONARY ARTERY BYPASS GRAFT  2000   CABG X5  . ESOPHAGOGASTRODUODENOSCOPY (EGD) WITH PROPOFOL N/A 12/25/2017   Procedure: ESOPHAGOGASTRODUODENOSCOPY (EGD) WITH PROPOFOL;  Surgeon: Wonda Horner, MD;  Location: [facility] ENDOSCOPY;  Service: Endoscopy;  Laterality: N/A;  . EYE SURGERY Left    "lasered before cataract OR"  . GIVENS CAPSULE STUDY  03/14/2018  . GIVENS CAPSULE STUDY N/A 03/14/2018   Procedure: GIVENS CAPSULE STUDY;  Surgeon: Ronald Lobo, MD;  Location: [place];  Service: Endoscopy;  Laterality: N/A;  . LOWER EXTREMITY ANGIOGRAM Bilateral 04/01/2011   Procedure: LOWER EXTREMITY ANGIOGRAM;  Surgeon: Conrad Elmira, MD;  Location: [facility] CATH LAB;  Service: Cardiovascular;  Laterality: Bilateral;  . LOWER EXTREMITY ANGIOGRAM Left 06/17/2011   Procedure: LOWER EXTREMITY ANGIOGRAM;  Surgeon: Conrad Stanhope, MD;  Location: [facility] CATH LAB;  Service: Cardiovascular;  Laterality: Left;  . LOWER EXTREMITY ANGIOGRAM N/A 11/18/2011   Procedure: LOWER EXTREMITY ANGIOGRAM;   Surgeon: Conrad Brookdale, MD;  Location: [facility] CATH LAB;  Service: Cardiovascular;  Laterality: N/A;  . PERCUTANEOUS STENT INTERVENTION Right 04/01/2011   Procedure: PERCUTANEOUS STENT INTERVENTION;  Surgeon: Conrad Hillman, MD;  Location: [facility] CATH LAB;  Service: Cardiovascular;  Laterality: Right;  rt iliac stent  . PERIPHERAL ARTERIAL STENT GRAFT  04/01/11   right common iliac  . RIGHT/LEFT HEART CATH AND CORONARY/GRAFT ANGIOGRAPHY N/A 10/11/2017   Procedure: RIGHT/LEFT HEART CATH AND CORONARY/GRAFT ANGIOGRAPHY;  Surgeon: Sherren Mocha, MD;  Location: [place] CV LAB;  Service: Cardiovascular;  Laterality: N/A;  . TEE WITHOUT CARDIOVERSION N/A 11/08/2017   Procedure: TRANSESOPHAGEAL ECHOCARDIOGRAM (TEE);  Surgeon: Sherren Mocha, MD;  Location: [place];  Service: Open Heart Surgery;  Laterality: N/A;  . TEE WITHOUT CARDIOVERSION N/A 12/15/2017   Procedure: TRANSESOPHAGEAL ECHOCARDIOGRAM (TEE);  Surgeon: Larey Dresser, MD;  Location: [facility] ENDOSCOPY;  Service: Cardiovascular;  Laterality: N/A;  . TRANSCATHETER AORTIC VALVE REPLACEMENT, TRANSFEMORAL N/A 11/08/2017   Procedure: TRANSCATHETER AORTIC VALVE REPLACEMENT, TRANSFEMORAL;  Surgeon: Sherren Mocha, MD;  Location: [place];  Service: Open Heart Surgery;  Laterality: N/A;  . TRIGGER FINGER RELEASE Left 1996   thumb     Allergies  Allergies  Allergen Reactions  . Morphine And Related Nausea And  Vomiting and Other (See Comments)    Chest pain, also   . Lisinopril Cough  . Zoloft [Sertraline Hcl] Nausea Only         Inpatient Medications    . amLODipine  10 mg Oral Daily  . apixaban  5 mg Oral BID  . atorvastatin  80 mg Oral q1800  . feeding supplement (ENSURE ENLIVE)  237 mL Oral BID BM  . furosemide  40 mg Intravenous BID  . insulin aspart  0-5 Units Subcutaneous QHS  . insulin aspart  5 Units Subcutaneous BID WC  . insulin NPH Human  10 Units Subcutaneous QHS  . insulin NPH Human  15 Units Subcutaneous QAC breakfast  . metoprolol  tartrate  25 mg Oral BID  . multivitamin with minerals  1 tablet Oral Daily  . pantoprazole  40 mg Oral Daily  . sodium chloride flush  3 mL Intravenous Q12H    Family History    Family History  Problem Relation Age of Onset  . Other Brother        intestinal blockage  . Diabetes Brother    She indicated that her mother is deceased. She indicated that her father is deceased. She indicated that only one of her two brothers is alive.   Social History    Social History   Socioeconomic History  . Marital status: Divorced    Spouse name: Not on file  . Number of children: 3  . Years of education: 31  . Highest education level: Not on file  Occupational History  . Occupation: Retired  Scientific laboratory technician  . Financial resource strain: Not on file  . Food insecurity:    Worry: Not on file    Inability: Not on file  . Transportation needs:    Medical: Not on file    Non-medical: Not on file  Tobacco Use  . Smoking status: Former Smoker    Packs/day: 2.00    Years: 30.00    Pack years: 60.00    Types: Cigarettes    Last attempt to quit: 04/19/1990    Years since quitting: 27.9  . Smokeless tobacco: Never Used  . Tobacco comment: stopped smoking cigarettes 1991  Substance and Sexual Activity  . Alcohol use: Not Currently    Alcohol/week: 0.0 standard drinks    Comment: "tried different alcohols when I was 1st married; never drank much at  ALL"  . Drug use: Never  . Sexual activity: Not Currently  Lifestyle  . Physical activity:    Days per week: Not on file    Minutes per session: Not on file  . Stress: Not on file  Relationships  . Social connections:    Talks on phone: Not on file    Gets together: Not on file    Attends religious service: Not on file    Active member of club or organization: Not on file    Attends meetings of clubs or organizations: Not on file    Relationship status: Not on file  . Intimate partner violence:    Fear of current or ex partner: Not on  file    Emotionally abused: Not on file    Physically abused: Not on file    Forced sexual activity: Not on file  Other Topics Concern  . Not on file  Social History Narrative   Divorced.  Native of [place].  Formerly worked as Education administrator person. 01/13/17 lives alone   Caffeine use: drinks decaf tea and coffee  Review of Systems    Review of Systems  Constitutional: Positive for malaise/fatigue. Negative for chills and fever.  HENT: Negative for congestion and sore throat.   Eyes: Negative for blurred vision and double vision.  Respiratory: Positive for shortness of breath. Negative for cough and sputum production.   Cardiovascular: Positive for palpitations, orthopnea, leg swelling and PND. Negative for chest pain.  Gastrointestinal: Negative for abdominal pain, nausea and vomiting. Blood in stool: patient unable to tell due to impaired vision.  Genitourinary: Hematuria: patient unable to tell due to poor vision.  Musculoskeletal: Negative for myalgias.  Neurological: Positive for dizziness. Negative for tingling and loss of consciousness.  Psychiatric/Behavioral: Negative for substance abuse.    Physical Exam    Blood pressure (!) 143/60, pulse 83, temperature 98.4 F (36.9 C), temperature source Oral, resp. rate 16, height 5\' 6"  (1.676 m), weight 82.7 kg, SpO2 97 %.  General: 82 y.o. Caucasian female resting comfortably in no acute distress. Pleasant and cooperative. HEENT: Normal  Neck: Supple. No JVD appreciated. Lungs: No increased work of breathing. Mild crackles noted in bilateral bases. Heart: Irregularly irregular rhythm with regular rate. No murmurs, gallops, or rubs.  Abdomen: Soft, non-distended, and non-tender to palpation. Bowel sounds present. Extremities: Mild pitting edema of bilateral lower extremities. Radial pulses 2+ and equal bilaterally. Neuro: Alert and oriented x3. No focal deficits. Moves all extremities spontaneously. Psych: Normal affect.  Labs     Troponin [facility] of Care Test) Recent Labs    03/20/18 2006  TROPIPOC 0.04   Recent Labs    03/20/18 2148 03/21/18 0242 03/21/18 0648  TROPONINI 0.05* 0.06* 0.07*   Lab Results  Component Value Date   WBC 9.6 03/21/2018   HGB 7.7 (L) 03/21/2018   HCT 25.3 (L) 03/21/2018   MCV 84.1 03/21/2018   PLT 270 03/21/2018    Recent Labs  Lab 03/20/18 1721 03/21/18 0242  NA 130* 136  K 3.9 3.7  CL 102 108  CO2 18* 19*  BUN 62* 57*  CREATININE 1.97* 1.82*  CALCIUM 8.3* 8.5*  PROT 7.6  --   BILITOT 1.0  --   ALKPHOS 118  --   ALT 32  --   AST 52*  --   GLUCOSE 359* 147*   Lab Results  Component Value Date   CHOL 111 02/07/2018   HDL 33 (L) 02/07/2018   LDLCALC 61 02/07/2018   TRIG 86 02/07/2018   Lab Results  Component Value Date   DDIMER 1.18 (H) 08/25/2017     Radiology Studies    Dg Chest 2 View  Result Date: 03/13/2018 CLINICAL DATA:  Shortness of breath EXAM: CHEST - 2 VIEW COMPARISON:  01/16/2018 FINDINGS: Cardiac shadow remains enlarged. Postsurgical changes are again seen and stable. Right-sided PICC line has been removed in the interval. Aortic calcifications are again noted. The lungs are hyperinflated with chronic interstitial changes bilaterally stable from the previous exam. Some central vascular congestion and mild interstitial edema is noted. No large pleural effusion is seen. IMPRESSION: Changes of mild CHF.  No focal infiltrate is seen. Electronically Signed   By: Inez Catalina M.D.   On: 03/13/2018 12:01   Dg Chest Port 1 View  Result Date: 03/20/2018 CLINICAL DATA:  Shortness of breath. EXAM: PORTABLE CHEST 1 VIEW COMPARISON:  Chest x-ray dated March 13, 2018. FINDINGS: Stable cardiomegaly status post TAVR and CABG. The lungs remain hyperinflated with chronic interstitial changes. Progressive diffuse interstitial thickening throughout both lungs. No focal consolidation,  pneumothorax, or large pleural effusion. No acute osseous abnormality.  IMPRESSION: Worsening pulmonary interstitial edema. Electronically Signed   By: Titus Dubin M.D.   On: 03/20/2018 17:58   Dg Foot 2 Views Right  Result Date: 03/13/2018 CLINICAL DATA:  Foot ulcer. EXAM: RIGHT FOOT - 2 VIEW COMPARISON:  None. FINDINGS: There is no evidence of fracture or dislocation. No radiographic evidence of osteomyelitis. Osteoarthritic changes at the first metatarsophalangeal joint. Ventral soft tissue swelling at the level of the metatarsophalangeal joints. Two linear metallic density foreign bodies within the ventral soft tissues adjacent to the first metatarsophalangeal joint. IMPRESSION: No evidence of osseous abnormalities to suggest osteomyelitis. Two linear metallic density foreign bodies within the ventral soft tissues adjacent to the first metatarsophalangeal joint. Electronically Signed   By: Fidela Salisbury M.D.   On: 03/13/2018 15:37   Vas [place] Burnard Bunting With/wo Tbi  Result Date: 03/06/2018 LOWER EXTREMITY DOPPLER STUDY Indications: Peripheral artery disease, and Peripheral vascular disease.  Vascular Interventions: Right CIA stent 04/01/11. Orbital ahterectomy and                         angioplasty x 2 on 2/28/ and 8/1 2013. TAVR 11/08/17. Performing Technologist: Ralene Cork RVT  Examination Guidelines: A complete evaluation includes at minimum, Doppler waveform signals and systolic blood pressure reading at the level of bilateral brachial, anterior tibial, and posterior tibial arteries, when vessel segments are accessible. Bilateral testing is considered an integral part of a complete examination. Photoelectric Plethysmograph (PPG) waveforms and toe systolic pressure readings are included as required and additional duplex testing as needed. Limited examinations for reoccurring indications may be performed as noted.  ABI Findings: +---------+------------------+-----+----------+--------+ Right    Rt Pressure (mmHg)IndexWaveform  Comment   +---------+------------------+-----+----------+--------+ Brachial 161                                       +---------+------------------+-----+----------+--------+ ATA      148               0.92 biphasic           +---------+------------------+-----+----------+--------+ PTA      137               0.85 monophasic         +---------+------------------+-----+----------+--------+ Great Toe86                0.53                    +---------+------------------+-----+----------+--------+ +---------+------------------+-----+--------+-------+ Left     Lt Pressure (mmHg)IndexWaveformComment +---------+------------------+-----+--------+-------+ Brachial 154                                    +---------+------------------+-----+--------+-------+ ATA      153               0.95 biphasic        +---------+------------------+-----+--------+-------+ PTA      255               1.58 biphasic        +---------+------------------+-----+--------+-------+ Great Toe126               0.78                 +---------+------------------+-----+--------+-------+ +-------+-----------+-----------+------------+------------+ ABI/TBIToday's ABIToday's TBIPrevious ABIPrevious TBI +-------+-----------+-----------+------------+------------+  Right  0.92       0.53       0.81        0.58         +-------+-----------+-----------+------------+------------+ Left   [place]         0.78       [place]          0.69         +-------+-----------+-----------+------------+------------+ Bilateral ABIs and TBIs appear essentially unchanged.  Summary: Right: Resting right ankle-brachial index indicates mild right lower extremity arterial disease. The right toe-brachial index is abnormal. RT great toe pressure = 86 mmHg. Left: Resting left ankle-brachial index indicates noncompressible left lower extremity arteries.The left toe-brachial index is normal. TBIs are unreliable. LT Great toe pressure = 126  mmHg.  *See table(s) above for measurements and observations.  Electronically signed by Harold Barban MD on 03/06/2018 at 11:04:07 AM.   Final    Vas [place] Lower Extremity Arterial Duplex  Result Date: 03/06/2018 LOWER EXTREMITY ARTERIAL DUPLEX STUDY Indications: Peripheral artery disease, and Peripheral vascular disease.  Vascular Interventions: Right CIA stent 04/01/11. Orbital ahterectomy and                         angioplasty x 2 on 2/28/ and 8/1 2013. TAVR 11/08/17. Current ABI:            Right: 0.92/0.53 Left: /0.78 Performing Technologist: Ralene Cork RVT  Examination Guidelines: A complete evaluation includes B-mode imaging, spectral Doppler, color Doppler, and power Doppler as needed of all accessible portions of each vessel. Bilateral testing is considered an integral part of a complete examination. Limited examinations for reoccurring indications may be performed as noted.  Left Duplex Findings: +-----------+--------+-----+--------+--------+--------+            PSV cm/sRatioStenosisWaveformComments +-----------+--------+-----+--------+--------+--------+ CFA Distal 171                  biphasic         +-----------+--------+-----+--------+--------+--------+ DFA        102                  biphasic         +-----------+--------+-----+--------+--------+--------+ SFA Prox   265                  biphasic         +-----------+--------+-----+--------+--------+--------+ SFA Mid    259                  biphasic         +-----------+--------+-----+--------+--------+--------+ SFA Distal 200                  biphasic         +-----------+--------+-----+--------+--------+--------+ POP Prox   161                  biphasic         +-----------+--------+-----+--------+--------+--------+ POP Distal 97                   biphasic         +-----------+--------+-----+--------+--------+--------+ ATA Distal 82                   biphasic          +-----------+--------+-----+--------+--------+--------+ PTA Distal 76                   biphasic         +-----------+--------+-----+--------+--------+--------+  PERO Distal47                   biphasic         +-----------+--------+-----+--------+--------+--------+  Summary: Left: Diffuse calcific plaque throughout the left lower extremity. Velocities throughout the SFA are in the 50-74% range by velocity.  See table(s) above for measurements and observations. Electronically signed by Harold Barban MD on 03/06/2018 at 11:44:06 AM.    Final    Vas [place] Lower Extremity Venous (dvt)  Result Date: 03/08/2018  Lower Venous Study Indications: Swelling, and Cellulitis, SOB.  Performing Technologist: Caralee Ates BA, RVT, RDMS  Examination Guidelines: A complete evaluation includes B-mode imaging, spectral Doppler, color Doppler, and power Doppler as needed of all accessible portions of each vessel. Bilateral testing is considered an integral part of a complete examination. Limited examinations for reoccurring indications may be performed as noted.  Right Venous Findings: +---------+---------------+---------+-----------+----------+-------+          CompressibilityPhasicitySpontaneityPropertiesSummary +---------+---------------+---------+-----------+----------+-------+ CFV      Full           Yes      Yes                          +---------+---------------+---------+-----------+----------+-------+ SFJ      Full           Yes      Yes                          +---------+---------------+---------+-----------+----------+-------+ FV Prox  Full           Yes      Yes                          +---------+---------------+---------+-----------+----------+-------+ FV Mid   Full           Yes      Yes                          +---------+---------------+---------+-----------+----------+-------+ FV DistalFull           Yes      Yes                           +---------+---------------+---------+-----------+----------+-------+ PFV      Full           Yes      Yes                          +---------+---------------+---------+-----------+----------+-------+ POP      Full           Yes      Yes                          +---------+---------------+---------+-----------+----------+-------+ PTV      Full           Yes      Yes                          +---------+---------------+---------+-----------+----------+-------+ PERO     Full           Yes      Yes                          +---------+---------------+---------+-----------+----------+-------+  Left Venous Findings: +---------+---------------+---------+-----------+----------+-------+          CompressibilityPhasicitySpontaneityPropertiesSummary +---------+---------------+---------+-----------+----------+-------+ CFV      Full           Yes      Yes                          +---------+---------------+---------+-----------+----------+-------+ SFJ      Full           Yes      Yes                          +---------+---------------+---------+-----------+----------+-------+ FV Prox  Full           Yes      Yes                          +---------+---------------+---------+-----------+----------+-------+ FV Mid   Full           Yes      Yes                          +---------+---------------+---------+-----------+----------+-------+ FV DistalFull           Yes      Yes                          +---------+---------------+---------+-----------+----------+-------+ PFV      Full           Yes      Yes                          +---------+---------------+---------+-----------+----------+-------+ POP      Full           Yes      Yes                          +---------+---------------+---------+-----------+----------+-------+ PTV      Full           Yes      Yes                           +---------+---------------+---------+-----------+----------+-------+ PERO     Full           Yes      Yes                          +---------+---------------+---------+-----------+----------+-------+    Summary: Right: There is no evidence of deep vein thrombosis in the lower extremity. Left: There is no evidence of deep vein thrombosis in the lower extremity. Pulsatile flow noted throughout entire bilateral venous study.  *See table(s) above for measurements and observations. Electronically signed by Deitra Mayo MD on 03/08/2018 at 10:22:22 AM.    Final     EKG     EKG: EKG was personally reviewed and demonstrates: Atrial fibrillation, rate 85 bpm, with PVC but no acute ischemic changes from prior tracings. Telemetry: Telemetry was personally reviewed and demonstrates: Atrial fibrillation with rate controlled in the 70's to 90's.  Cardiac Imaging    TEE 12/15/2017: Study Conclusions: - Left ventricle: Normal LV size and systolic function, EF 62%.   There did appear to be septal bounce. - Aortic valve: Bioprosthetic aortic valve  s/p TAVR with mild   perivalvular aortic insufficiency. No stenosis with mean gradient   7 mmHg. There was no vegetation on the aortic valve. - Aorta: Normal caliber thoracic aorta with grade III plaque. - Mitral valve: No evidence of vegetation. There was mild to   moderate regurgitation. - Left atrium: The atrium was moderately dilated. Smoke but no   discrete thrombus in the LA appendage. - Right ventricle: The cavity size was normal. Systolic function   was normal. - Right atrium: The atrium was mildly dilated. - Atrial septum: Small PFO seen by color doppler. - Tricuspid valve: No evidence of vegetation. - Pulmonic valve: No evidence of vegetation.  Impressions: - No evidence of endocarditis.  Assessment & Plan    Acute on Chronic Diastolic CHF - Patient presented to ED with worsening shortness of breath.  - Chest x-ray showed worsening  pulmonary interstitial edema. - BNP elevated at 1,119.8 on admission (2,601.1 on last admission on 03/13/2018) - Recent TEE on 12/15/2017 showed LVEF of 55%.  - Patient currently on IV Lasix 40mg  twice daily. - Documented urine output of 1.075 L in the past 24 hours. - Patient reports significant improvement in her breathing since admission. - Continue IV Lasix twice daily. - Continue beta-blocker. - Continue to monitor daily weights, strict I/O's, and renal function.  Elevated Troponin with CAD - Patient has history of CAD s/p CABG in 2000.  - Troponin minimally elevated at 0.05 >> 0.06 >> 0.07. Not consistent with ACS. Likely secondary to demand ischemia in setting of acute CHF and anemia.  Atrial Fibrillation - Patient currently in atrial fibrillation on telemetry with rates currently controlled in the 70's to 90's.  - Continue Lopressor 25mg  twice daily. - Continue chronic anticoagulation with Eliquis.   Anemia - Patient has required multiple blood transfusion since 12/2017.  - EGD on 12/25/2017 showed no active bleed. - Stool hemoccult positive during last admission but capsule endoscopy was normal.  - Hgb 8.2 on admission and 7.7 today. - Iron low at 18 and TIBC low at 211. Ferritin normal. - Retic count  normal.  - Patient currently receiving 1 unit of PRBCs.  - Per primary team.   Aortic Stenosis s/p TAVR in 2019  - Patient had TAVR on 11/08/2017.   Hypertension - Most recent BP 143/60.  - Continue home medications: Amlodipine 10mg  daily and Lopressor 25mg  twice daily. - Continue IV Lasix as above.   Hyperlipidemia - Continue Lipitor 80mg  daily.  CKD Stage III - SCr 1.97 on admission. Previously noted to be as high as 2.69 during recent hospitalization. However, baseline from 11/2017 around 1.0 to 1.3.  - SCr 1.82 today. - Continue to monitor renal function daily.  Signed, Darreld Mclean, PA-C 03/21/2018, 2:42 PM  For questions or updates, please contact   Please  consult [email or web address] for contact info under Cardiology/STEMI.

## 2018-03-21 NOTE — Evaluation (Signed)
Physical Therapy Evaluation Patient Details Name: Kristina Summers MRN: [id number] DOB: 11/11/1935 Today's Date: 03/21/2018   History of Present Illness  Pt is an 82 y.o. female admitted with worsening SOB. CXR with worsening interstitial edema, BNP elevated; worked up for CHF exacerbation and UTI. Of note, recent admission for 11/25-11/30 for CHF exacerbation complicated by symptomatic anemia; had normal capsule endoscopy. PMH includes CAD s/p CABG, aortic stenosis s/p TAVR (10/2017), PVD, HF, DM2.     Clinical Impression  Pt presents with an overall decrease in functional mobility secondary to above. PTA, pt mod indep with ambulation using SPC or rollator; was recently d/c 03/18/18, but had not started with HHPT services before readmission. Today, pt ambulatory with RW at supervision-level. SpO2 >92% on RA; DOE 2/4 with activity. Pt would benefit from continued acute PT services to maximize functional mobility and independence prior to d/c with HHPT.     Follow Up Recommendations Home health PT    Equipment Recommendations  None recommended by PT    Recommendations for Other Services       Precautions / Restrictions Precautions Precautions: Fall Restrictions Weight Bearing Restrictions: No      Mobility  Bed Mobility Overal bed mobility: Independent             General bed mobility comments: Received sitting EOB  Transfers Overall transfer level: Modified independent Equipment used: None;Rolling walker (2 wheeled)             General transfer comment: Mod indep standing from bed with and without RW  Ambulation/Gait Ambulation/Gait assistance: Supervision Gait Distance (Feet): 350 Feet Assistive device: Rolling walker (2 wheeled)   Gait velocity: Decreased Gait velocity interpretation: 1.31 - 2.62 ft/sec, indicative of limited community ambulator General Gait Details: Ambulatory in room without DME, reaching to furniture for support; hallway ambulation with RW.  Supervision for safety  Stairs            Wheelchair Mobility    Modified Rankin (Stroke Patients Only)       Balance Overall balance assessment: Needs assistance Sitting-balance support: No upper extremity supported;Feet supported Sitting balance-Leahy Scale: Good     Standing balance support: Single extremity supported;During functional activity Standing balance-Leahy Scale: Fair Standing balance comment: Amb in room without DME support, intermittently reaching for furniture                             Pertinent Vitals/Pain Pain Assessment: Faces Faces Pain Scale: Hurts a little bit Pain Location: Back post-ambulation Pain Descriptors / Indicators: Discomfort;Grimacing Pain Intervention(s): Limited activity within patient's tolerance;Repositioned    Home Living Family/patient expects to be discharged to:: Private residence Living Arrangements: Alone Available Help at Discharge: Family;Available PRN/intermittently Type of Home: Apartment Home Access: Stairs to enter Entrance Stairs-Rails: Right Entrance Stairs-Number of Steps: 1 Home Layout: One level Home Equipment: Walker - 4 wheels;Cane - single point;Tub bench Additional Comments: Senior living apartment    Prior Function Level of Independence: Independent with assistive device(s)   Gait / Transfers Assistance Needed: Household ambulation without DME. Uses rollator for community ambulation. Enjoys walking to community center nearby  ADL's / Homemaking Assistance Needed: Daughter drives for errands; uses transportation to MD appointments  Comments: Poor vision; typically only walks in familiar environments     Hand Dominance   Dominant Hand: Right    Extremity/Trunk Assessment                Communication  Communication: No difficulties  Cognition Arousal/Alertness: Awake/alert Behavior During Therapy: WFL for tasks assessed/performed Overall Cognitive Status: Within Functional  Limits for tasks assessed                                 General Comments: WFL for simple tasks. A&Ox4      General Comments General comments (skin integrity, edema, etc.): SpO2 >92% on RA throughout. Pt without call bell in room (nurse secretary notified to order)    Exercises     Assessment/Plan    PT Assessment Patient needs continued PT services  PT Problem List Decreased strength;Decreased activity tolerance;Decreased balance;Decreased mobility;Cardiopulmonary status limiting activity       PT Treatment Interventions Gait training;Stair training;Functional mobility training;Therapeutic activities;DME instruction;Therapeutic exercise;Balance training;Patient/family education    PT Goals (Current goals can be found in the Care Plan section)  Acute Rehab PT Goals Patient Stated Goal: Return home PT Goal Formulation: With patient Time For Goal Achievement: 04/04/18 Potential to Achieve Goals: Good    Frequency Min 3X/week   Barriers to discharge        Co-evaluation               AM-PAC PT "6 Clicks" Mobility  Outcome Measure Help needed turning from your back to your side while in a flat bed without using bedrails?: None Help needed moving from lying on your back to sitting on the side of a flat bed without using bedrails?: None Help needed moving to and from a bed to a chair (including a wheelchair)?: None Help needed standing up from a chair using your arms (e.g., wheelchair or bedside chair)?: A Little Help needed to walk in hospital room?: A Little Help needed climbing 3-5 steps with a railing? : A Little 6 Click Score: 21    End of Session Equipment Utilized During Treatment: Gait belt Activity Tolerance: Patient tolerated treatment well Patient left: in chair;with call bell/phone within reach Nurse Communication: Mobility status PT Visit Diagnosis: Other abnormalities of gait and mobility (R26.89)    Time: 2505-3976 PT Time  Calculation (min) (ACUTE ONLY): 21 min   Charges:   PT Evaluation $PT Eval Moderate Complexity: [place], PT, DPT Acute Rehabilitation Services  Pager [phone] Office [place] 03/21/2018, 11:39 AM

## 2018-03-22 DIAGNOSIS — N3001 Acute cystitis with hematuria: Secondary | ICD-10-CM

## 2018-03-22 DIAGNOSIS — D509 Iron deficiency anemia, unspecified: Secondary | ICD-10-CM

## 2018-03-22 LAB — TYPE AND SCREEN
ABO/RH(D): A POS
Antibody Screen: NEGATIVE
Unit division: 0

## 2018-03-22 LAB — BASIC METABOLIC PANEL
ANION GAP: 12 (ref 5–15)
BUN: 51 mg/dL — ABNORMAL HIGH (ref 8–23)
CO2: 21 mmol/L — ABNORMAL LOW (ref 22–32)
Calcium: 8.4 mg/dL — ABNORMAL LOW (ref 8.9–10.3)
Chloride: 104 mmol/L (ref 98–111)
Creatinine, Ser: 1.66 mg/dL — ABNORMAL HIGH (ref 0.44–1.00)
GFR calc Af Amer: 33 mL/min — ABNORMAL LOW (ref >60)
GFR calc non Af Amer: 29 mL/min — ABNORMAL LOW (ref >60)
Glucose, Bld: 171 mg/dL — ABNORMAL HIGH (ref 70–99)
Potassium: 3.8 mmol/L (ref 3.5–5.1)
Sodium: 137 mmol/L (ref 135–145)

## 2018-03-22 LAB — MAGNESIUM: Magnesium: 1.9 mg/dL (ref 1.7–2.4)

## 2018-03-22 LAB — CBC
HCT: 28.8 % — ABNORMAL LOW (ref 36.0–46.0)
Hemoglobin: 9 g/dL — ABNORMAL LOW (ref 12.0–15.0)
MCH: 26 pg (ref 26.0–34.0)
MCHC: 31.3 g/dL (ref 30.0–36.0)
MCV: 83.2 fL (ref 80.0–100.0)
PLATELETS: 259 10*3/uL (ref 150–400)
RBC: 3.46 MIL/uL — ABNORMAL LOW (ref 3.87–5.11)
RDW: 17.3 % — ABNORMAL HIGH (ref 11.5–15.5)
WBC: 9.4 10*3/uL (ref 4.0–10.5)
nRBC: 0 % (ref 0.0–0.2)

## 2018-03-22 LAB — BPAM RBC
BLOOD PRODUCT EXPIRATION DATE: 201912222359
ISSUE DATE / TIME: 201912031224
Unit Type and Rh: 6200

## 2018-03-22 LAB — GLUCOSE, CAPILLARY
GLUCOSE-CAPILLARY: 192 mg/dL — AB (ref 70–99)
Glucose-Capillary: 151 mg/dL — ABNORMAL HIGH (ref 70–99)
Glucose-Capillary: 260 mg/dL — ABNORMAL HIGH (ref 70–99)
Glucose-Capillary: 351 mg/dL — ABNORMAL HIGH (ref 70–99)

## 2018-03-22 LAB — URINE CULTURE: Culture: >=100000 — AB

## 2018-03-22 MED ORDER — JUVEN PO PACK
1.0000 | PACK | Freq: Two times a day (BID) | ORAL | Status: DC
  Administered 2018-03-23: 1 via ORAL
  Filled 2018-03-22 (×4): qty 1

## 2018-03-22 NOTE — Progress Notes (Signed)
PROGRESS NOTE    Kristina Summers  OVZ:[id number] DOB: 27-Jan-1936 DOA: 03/20/2018 PCP: [facility] [place] Narrative:82 year old female with history of DM-2, diastolic CHF, CKD 4, CAD status post CABG in 2000, AS status post TVAR in 10/2017, PVD with multiple lower extremity angiogram and stent, A. fib on Eliquis and GI bleed status post recent EGD and capsule endoscopy admitted with worsening dyspnea thought to be due to diastolic CHF exacerbation.  Chest x-ray with worsening pulmonary interstitial edema.  BNP elevated to 1119 but less than baseline.  Mild non-ACS pattern troponin elevation.  Also concern for UTI/pyelonephritis based on urinalysis but patient without symptoms.  Hemoglobin 8.2 on admission which is about baseline but trended to 7.7 the next day.     Assessment & Plan:   Principal Problem:   Acute on chronic congestive heart failure (HCC) Active Problems:   Diabetes mellitus type 2 with peripheral artery disease (HCC)   Essential hypertension   Type II diabetes mellitus with renal manifestations (HCC)   Hypochromic anemia   S/P TAVR (transcatheter aortic valve replacement)   #1 acute on chronic diastolic CHF patient diuresing well on Lasix 40 mg twice a day.  Diuresed over a liter overnight.  Renal functions improving.  Echo 11/24/2017 with ejection fraction 55%.  Cardiology following.  If she continues to improve she probably can be discharged home tomorrow if cardiology agrees.  Patient still has bibasilar crackles and lower extremity edema and still has room for day IV diuresis.  #2 symptomatic anemia patient had extensive GI work-up with a recent hospitalization .  She is known to have severe iron deficiency anemia she is status post 1 unit of packed RBC.  Hemoglobin stable today.  Continue Eliquis.  #3 A. fib rate controlled on metoprolol continue Eliquis  #4 history of TVAR 10/2017 stable  #5 stage III CKD stable  #6 type 2 diabetes complicated with nephropathy  #7  hypertension stable continue current medications  #8 hyperlipidemia continue statin.  Malnutrition Type:  Nutrition Problem: Increased nutrient needs Etiology: wound healing   Malnutrition Characteristics:  Signs/Symptoms: estimated needs   Nutrition Interventions:  Interventions: MVI, Juven  Estimated body mass index is 28.96 kg/m as calculated from the following:   Height as of this encounter: 5\' 6"  (1.676 m).   Weight as of this encounter: 81.4 kg.  DVT prophylaxis Eliquis Code Status: DNR Family Communication: No family available Disposition Plan: Patient needs further diuresis  Consultants:  Cardiology Procedures: None Antimicrobials: None  Subjective: Still complains of shortness of breath and dyspnea on exertion and lower extremity edema Objective: Vitals:   03/21/18 2213 03/22/18 0336 03/22/18 0339 03/22/18 1130  BP: (!) 155/63  (!) 149/62 (!) 149/60  Pulse: 92  86 64  Resp:   18 18  Temp:   (!) 97.4 F (36.3 C) 97.8 F (36.6 C)  TempSrc:   Oral Oral  SpO2:   93% 95%  Weight:  81.4 kg    Height:        Intake/Output Summary (Last 24 hours) at 03/22/2018 1410 Last data filed at 03/22/2018 1031 Gross per 24 hour  Intake 971 ml  Output 1350 ml  Net -379 ml   Filed Weights   03/20/18 1639 03/21/18 0032 03/22/18 0336  Weight: 85.3 kg 82.7 kg 81.4 kg    Examination:  General exam: Appears calm and comfortable  Respiratory system: CRACKLES  to auscultation. Respiratory effort normal. Cardiovascular system: S1 & S2 heard, RRR. No JVD, murmurs, rubs,  gallops or clicks.2 PLUS  pedal edema. Gastrointestinal system: Abdomen is nondistended, soft and nontender. No organomegaly or masses felt. Normal bowel sounds heard. Central nervous system: Alert and oriented. No focal neurological deficits. Extremities: Symmetric 5 x 5 power. Skin: No rashes, lesions or ulcers Psychiatry: Judgement and insight appear normal. Mood & affect appropriate.     Data  Reviewed: I have personally reviewed following labs and imaging studies  CBC: Recent Labs  Lab 03/15/18 1905  03/18/18 0402 03/20/18 1721 03/21/18 0648 03/21/18 1914 03/22/18 0351  WBC 9.5  --  10.1 9.7 9.6  --  9.4  NEUTROABS 7.4  --  8.3* 7.8* 7.5  --   --   HGB 9.2*   < > 8.6* 8.2* 7.7* 9.1* 9.0*  HCT 29.7*   < > 28.9* 28.2* 25.3* 30.2* 28.8*  MCV 85.1  --  85.8 87.3 84.1  --  83.2  PLT 265  --  276 284 270  --  259   < > = values in this interval not displayed.   Basic Metabolic Panel: Recent Labs  Lab 03/17/18 0454 03/18/18 0402 03/20/18 1721 03/21/18 0242 03/22/18 0351  NA 132* 133* 130* 136 137  K 4.2 3.5 3.9 3.7 3.8  CL 107 104 102 108 104  CO2 20* 19* 18* 19* 21*  GLUCOSE 238* 205* 359* 147* 171*  BUN 79* 73* 62* 57* 51*  CREATININE 2.69* 2.25* 1.97* 1.82* 1.66*  CALCIUM 8.3* 8.4* 8.3* 8.5* 8.4*  MG  --   --  2.0  --  1.9  PHOS  --   --  3.0  --   --    GFR: Estimated Creatinine Clearance: 29.1 mL/min (A) (by C-G formula based on SCr of 1.66 mg/dL (H)). Liver Function Tests: Recent Labs  Lab 03/20/18 1721  AST 52*  ALT 32  ALKPHOS 118  BILITOT 1.0  PROT 7.6  ALBUMIN 1.6*   Recent Labs  Lab 03/20/18 1721  LIPASE 22   No results for input(s): AMMONIA in the last 168 hours. Coagulation Profile: Recent Labs  Lab 03/20/18 1721  INR 1.24   Cardiac Enzymes: Recent Labs  Lab 03/20/18 2148 03/21/18 0242 03/21/18 0648  TROPONINI 0.05* 0.06* 0.07*   BNP (last 3 results) No results for input(s): PROBNP in the last 8760 hours. HbA1C: No results for input(s): HGBA1C in the last 72 hours. CBG: Recent Labs  Lab 03/21/18 1149 03/21/18 1628 03/21/18 2101 03/22/18 0739 03/22/18 1133  GLUCAP 149* 242* 279* 151* 260*   Lipid Profile: No results for input(s): CHOL, HDL, LDLCALC, TRIG, CHOLHDL, LDLDIRECT in the last 72 hours. Thyroid Function Tests: Recent Labs    03/20/18 1721  TSH 1.730   Anemia Panel: Recent Labs    03/21/18 0754    VITAMINB12 852  FOLATE 23.8  FERRITIN 104  TIBC 211*  IRON 19*  RETICCTPCT 1.4   Sepsis Labs: Recent Labs  Lab 03/20/18 1733 03/20/18 2008  LATICACIDVEN 1.39 0.93    Recent Results (from the past 240 hour(s))  Blood Cultures x 2 sites     Status: None   Collection Time: 03/13/18  3:45 PM  Result Value Ref Range Status   Specimen Description BLOOD RIGHT ANTECUBITAL  Final   Special Requests   Final    BOTTLES DRAWN AEROBIC AND ANAEROBIC Blood Culture adequate volume   Culture   Final    NO GROWTH 5 DAYS Performed at [facility], [place] [address] [place],  [address]  Report Status 03/18/2018 FINAL  Final  Blood Cultures x 2 sites     Status: None   Collection Time: 03/13/18  3:51 PM  Result Value Ref Range Status   Specimen Description BLOOD RIGHT FOREARM  Final   Special Requests   Final    BOTTLES DRAWN AEROBIC AND ANAEROBIC Blood Culture adequate volume   Culture   Final    NO GROWTH 5 DAYS Performed at [facility], [address] [place], [place] [address]    Report Status 03/18/2018 FINAL  Final  MRSA PCR Screening     Status: Abnormal   Collection Time: 03/13/18  5:59 PM  Result Value Ref Range Status   MRSA by PCR POSITIVE (A) NEGATIVE Final    Comment:        The GeneXpert MRSA Assay (FDA approved for NASAL specimens only), is one component of a comprehensive MRSA colonization surveillance program. It is not intended to diagnose MRSA infection nor to guide or monitor treatment for MRSA infections. RESULT CALLED TO, READ BACK BY AND VERIFIED WITH: V.[email or web address],RN AT 2050 BY L.PITT 03/12/18   Urine culture     Status: Abnormal   Collection Time: 03/20/18  5:21 PM  Result Value Ref Range Status   Specimen Description URINE, RANDOM  Final   Special Requests   Final    NONE Performed at [facility], [place] [address], [place], [place] [address]    Culture >=100,000 COLONIES/mL KLEBSIELLA PNEUMONIAE (A)  Final   Report Status  03/22/2018 FINAL  Final   Organism ID, Bacteria KLEBSIELLA PNEUMONIAE (A)  Final      Susceptibility   Klebsiella pneumoniae - MIC*    AMPICILLIN RESISTANT Resistant     CEFAZOLIN <=4 SENSITIVE Sensitive     CEFTRIAXONE <=1 SENSITIVE Sensitive     CIPROFLOXACIN <=0.25 SENSITIVE Sensitive     GENTAMICIN <=1 SENSITIVE Sensitive     IMIPENEM <=0.25 SENSITIVE Sensitive     NITROFURANTOIN 32 SENSITIVE Sensitive     TRIMETH/SULFA <=20 SENSITIVE Sensitive     AMPICILLIN/SULBACTAM 4 SENSITIVE Sensitive     PIP/TAZO <=4 SENSITIVE Sensitive     Extended ESBL NEGATIVE Sensitive     * >=100,000 COLONIES/mL KLEBSIELLA PNEUMONIAE  Culture, blood (routine x 2)     Status: None (Preliminary result)   Collection Time: 03/20/18  7:53 PM  Result Value Ref Range Status   Specimen Description BLOOD LEFT HAND  Final   Special Requests   Final    BOTTLES DRAWN AEROBIC AND ANAEROBIC Blood Culture adequate volume   Culture   Final    NO GROWTH 2 DAYS Performed at [facility], [address] [place], [place] [address]    Report Status PENDING  Incomplete  Culture, blood (routine x 2)     Status: None (Preliminary result)   Collection Time: 03/20/18  7:59 PM  Result Value Ref Range Status   Specimen Description RIGHT ANTECUBITAL  Final   Special Requests   Final    BOTTLES DRAWN AEROBIC AND ANAEROBIC Blood Culture adequate volume   Culture   Final    NO GROWTH 2 DAYS Performed at [facility], [place] [address] [place],  [address]    Report Status PENDING  Incomplete         Radiology Studies: Dg Chest Port 1 View  Result Date: 03/20/2018 CLINICAL DATA:  Shortness of breath. EXAM: PORTABLE CHEST 1 VIEW COMPARISON:  Chest x-ray dated March 13, 2018. FINDINGS: Stable cardiomegaly status post TAVR and CABG. The lungs remain hyperinflated with chronic interstitial changes. Progressive diffuse interstitial thickening throughout both lungs. No focal consolidation, pneumothorax, or  large pleural effusion. No acute osseous abnormality. IMPRESSION: Worsening pulmonary interstitial edema. Electronically Signed   By: Titus Dubin M.D.   On: 03/20/2018 17:58        Scheduled Meds: . amLODipine  10 mg Oral Daily  . apixaban  5 mg Oral BID  . atorvastatin  80 mg Oral q1800  . furosemide  40 mg Intravenous TID  . insulin aspart  0-5 Units Subcutaneous QHS  . insulin aspart  5 Units Subcutaneous BID WC  . insulin NPH Human  10 Units Subcutaneous QHS  . insulin NPH Human  15 Units Subcutaneous QAC breakfast  . metoprolol tartrate  25 mg Oral BID  . multivitamin with minerals  1 tablet Oral Daily  . nutrition supplement (JUVEN)  1 packet Oral BID BM  . pantoprazole  40 mg Oral Daily  . sodium chloride flush  3 mL Intravenous Q12H   Continuous Infusions: . sodium chloride       LOS: 2 days     Georgette Shell, MD [facility]  If 7PM-7AM, please contact night-coverage [email or web address] Password [email or web address] 03/22/2018, 2:10 PM

## 2018-03-22 NOTE — Telephone Encounter (Signed)
Cardiac Rehab - Pharmacy Resident Documentation   Patient currently admitted. Medication history not completed.   Isaias Sakai, Sherian Rein D PGY1 Pharmacy Resident  03/22/2018      9:13 AM

## 2018-03-22 NOTE — Progress Notes (Signed)
DAILY PROGRESS NOTE   Patient Name: Kristina Summers Date of Encounter: 03/22/2018  Chief Complaint    Breathing is better  Patient Profile    Kristina Summers is a 82 y.o. female with a history of CAD s/p CABG in 2000, severe aortic stenosis s/p recent TAVR in 10/2017,  chronic diastolic heart failure, paroxsymal atrial fibrillation on Eliquis, CVA in 2016, hypertension, hyperlipidemia, type 2 diabetes mellitus, GERD, fibromyalgia, CKD Stage IV, and anemia requiring multiple blood transfusions recently, who is being seen today for the evaluation of CHF at the request of Dr. Cyndia Skeeters  Subjective   Diuresed another 1L negative overnight. Creatinine improved from 1.8 to 1.66. H/H stable at 9/28. Weight down 4 Kg.  Objective   Vitals:   03/21/18 1943 03/21/18 2213 03/22/18 0336 03/22/18 0339  BP: (!) 164/61 (!) 155/63  (!) 149/62  Pulse: 94 92  86  Resp: 18   18  Temp: 98 F (36.7 C)   (!) 97.4 F (36.3 C)  TempSrc:    Oral  SpO2: 96%   93%  Weight:   81.4 kg   Height:        Intake/Output Summary (Last 24 hours) at 03/22/2018 0814 Last data filed at 03/22/2018 0256 Gross per 24 hour  Intake 971 ml  Output 1975 ml  Net -1004 ml   Filed Weights   03/20/18 1639 03/21/18 0032 03/22/18 0336  Weight: 85.3 kg 82.7 kg 81.4 kg    Physical Exam   General appearance: alert and no distress Neck: JVD - 3 cm above sternal notch, no carotid bruit and thyroid not enlarged, symmetric, no tenderness/mass/nodules Lungs: diminished breath sounds bilaterally and rales bibasilar Heart: regular rate and rhythm and systolic murmur: systolic ejection 2/6, crescendo at 2nd right intercostal space Abdomen: soft, non-tender; bowel sounds normal; no masses,  no organomegaly Extremities: edema trace edema Pulses: 2+ and symmetric Skin: Skin color, texture, turgor normal. No rashes or lesions Neurologic: Grossly normal Psych: Pleasant  Inpatient Medications    Scheduled Meds: . amLODipine  10 mg Oral  Daily  . apixaban  5 mg Oral BID  . atorvastatin  80 mg Oral q1800  . feeding supplement (ENSURE ENLIVE)  237 mL Oral BID BM  . furosemide  40 mg Intravenous TID  . insulin aspart  0-5 Units Subcutaneous QHS  . insulin aspart  5 Units Subcutaneous BID WC  . insulin NPH Human  10 Units Subcutaneous QHS  . insulin NPH Human  15 Units Subcutaneous QAC breakfast  . metoprolol tartrate  25 mg Oral BID  . multivitamin with minerals  1 tablet Oral Daily  . pantoprazole  40 mg Oral Daily  . sodium chloride flush  3 mL Intravenous Q12H    Continuous Infusions: . sodium chloride      PRN Meds: sodium chloride, acetaminophen, ondansetron (ZOFRAN) IV, sodium chloride flush   Labs   Results for orders placed or performed during the hospital encounter of 03/20/18 (from the past 48 hour(s))  Comprehensive metabolic panel     Status: Abnormal   Collection Time: 03/20/18  5:21 PM  Result Value Ref Range   Sodium 130 (L) 135 - 145 mmol/L   Potassium 3.9 3.5 - 5.1 mmol/L   Chloride 102 98 - 111 mmol/L   CO2 18 (L) 22 - 32 mmol/L   Glucose, Bld 359 (H) 70 - 99 mg/dL   BUN 62 (H) 8 - 23 mg/dL   Creatinine, Ser 1.97 (H) 0.44 - 1.00  mg/dL   Calcium 8.3 (L) 8.9 - 10.3 mg/dL   Total Protein 7.6 6.5 - 8.1 g/dL   Albumin 1.6 (L) 3.5 - 5.0 g/dL   AST 52 (H) 15 - 41 U/L   ALT 32 0 - 44 U/L   Alkaline Phosphatase 118 38 - 126 U/L   Total Bilirubin 1.0 0.3 - 1.2 mg/dL   GFR calc non Af Amer 23 (L) >60 mL/min   GFR calc Af Amer 27 (L) >60 mL/min   Anion gap 10 5 - 15    Comment: Performed at [facility] [address], [place], [place] [address]  Lipase, blood     Status: None   Collection Time: 03/20/18  5:21 PM  Result Value Ref Range   Lipase 22 11 - 51 U/L    Comment: Performed at [facility] [address] [place], [place] [address]  Brain natriuretic peptide     Status: Abnormal   Collection Time: 03/20/18  5:21 PM  Result Value Ref Range   B Natriuretic Peptide 1,119.8  (H) 0.0 - 100.0 pg/mL    Comment: Performed at [facility] [address] [place], [place] [address]  CBC with Differential     Status: Abnormal   Collection Time: 03/20/18  5:21 PM  Result Value Ref Range   WBC 9.7 4.0 - 10.5 K/uL   RBC 3.23 (L) 3.87 - 5.11 MIL/uL   Hemoglobin 8.2 (L) 12.0 - 15.0 g/dL   HCT 28.2 (L) 36.0 - 46.0 %   MCV 87.3 80.0 - 100.0 fL   MCH 25.4 (L) 26.0 - 34.0 pg   MCHC 29.1 (L) 30.0 - 36.0 g/dL   RDW 17.5 (H) 11.5 - 15.5 %   Platelets 284 150 - 400 K/uL   nRBC 0.0 0.0 - 0.2 %   Neutrophils Relative % 81 %   Neutro Abs 7.8 (H) 1.7 - 7.7 K/uL   Lymphocytes Relative 7 %   Lymphs Abs 0.7 0.7 - 4.0 K/uL   Monocytes Relative 7 %   Monocytes Absolute 0.7 0.1 - 1.0 K/uL   Eosinophils Relative 5 %   Eosinophils Absolute 0.5 0.0 - 0.5 K/uL   Basophils Relative 0 %   Basophils Absolute 0.0 0.0 - 0.1 K/uL   Immature Granulocytes 0 %   Abs Immature Granulocytes 0.02 0.00 - 0.07 K/uL    Comment: Performed at [facility], [place] [address], [place], [place] [address]  Protime-INR     Status: Abnormal   Collection Time: 03/20/18  5:21 PM  Result Value Ref Range   Prothrombin Time 15.4 (H) 11.4 - 15.2 seconds   INR 1.24     Comment: Performed at [facility] [address] [place], [place] [address]  Urinalysis, Routine w reflex microscopic     Status: Abnormal   Collection Time: 03/20/18  5:21 PM  Result Value Ref Range   Color, Urine YELLOW YELLOW   APPearance HAZY (A) CLEAR   Specific Gravity, Urine 1.013 1.005 - 1.030   pH 6.0 5.0 - 8.0   Glucose, UA >=500 (A) NEGATIVE mg/dL   Hgb urine dipstick LARGE (A) NEGATIVE   Bilirubin Urine NEGATIVE NEGATIVE   Ketones, ur NEGATIVE NEGATIVE mg/dL   Protein, ur 100 (A) NEGATIVE mg/dL   Nitrite NEGATIVE NEGATIVE   Leukocytes, UA MODERATE (A) NEGATIVE   RBC / HPF 21-50 0 - 5 RBC/hpf   WBC, UA 11-20 0 - 5  WBC/hpf   Bacteria, UA MANY (A) NONE SEEN   Squamous Epithelial / LPF 0-5 0 - 5    Comment:  Performed at [facility], [place] [address], [place], [place] [address]  Urine culture     Status: Abnormal   Collection Time: 03/20/18  5:21 PM  Result Value Ref Range   Specimen Description URINE, RANDOM    Special Requests      NONE Performed at [facility], [place] [address] [place], [place] [address]    Culture >=100,000 COLONIES/mL KLEBSIELLA PNEUMONIAE (A)    Report Status 03/22/2018 FINAL    Organism ID, Bacteria KLEBSIELLA PNEUMONIAE (A)       Susceptibility   Klebsiella pneumoniae - MIC*    AMPICILLIN RESISTANT Resistant     CEFAZOLIN <=4 SENSITIVE Sensitive     CEFTRIAXONE <=1 SENSITIVE Sensitive     CIPROFLOXACIN <=0.25 SENSITIVE Sensitive     GENTAMICIN <=1 SENSITIVE Sensitive     IMIPENEM <=0.25 SENSITIVE Sensitive     NITROFURANTOIN 32 SENSITIVE Sensitive     TRIMETH/SULFA <=20 SENSITIVE Sensitive     AMPICILLIN/SULBACTAM 4 SENSITIVE Sensitive     PIP/TAZO <=4 SENSITIVE Sensitive     Extended ESBL NEGATIVE Sensitive     * >=100,000 COLONIES/mL KLEBSIELLA PNEUMONIAE  Magnesium     Status: None   Collection Time: 03/20/18  5:21 PM  Result Value Ref Range   Magnesium 2.0 1.7 - 2.4 mg/dL    Comment: Performed at [facility], [place] [address], [place], [place] [address]  Phosphorus     Status: None   Collection Time: 03/20/18  5:21 PM  Result Value Ref Range   Phosphorus 3.0 2.5 - 4.6 mg/dL    Comment: Performed at [facility] [address], [place], [place] [address]  TSH     Status: None   Collection Time: 03/20/18  5:21 PM  Result Value Ref Range   TSH 1.730 0.350 - 4.500 uIU/mL    Comment: Performed by a 3rd Generation assay with a functional sensitivity of <=0.01 uIU/mL. Performed at [facility], [place] [address] [place], [place] [address]   I-stat troponin, ED     Status: None   Collection Time: 03/20/18  5:31 PM  Result Value Ref Range   Troponin i, poc 0.05 0.00 - 0.08 ng/mL   Comment 3            Comment: Due to the  release kinetics of cTnI, a negative result within the first hours of the onset of symptoms does not rule out myocardial infarction with certainty. If myocardial infarction is still suspected, repeat the test at appropriate intervals.   I-Stat CG4 Lactic Acid, ED     Status: None   Collection Time: 03/20/18  5:33 PM  Result Value Ref Range   Lactic Acid, Venous 1.39 0.5 - 1.9 mmol/L  Culture, blood (routine x 2)     Status: None (Preliminary result)   Collection Time: 03/20/18  7:53 PM  Result Value Ref Range   Specimen Description BLOOD LEFT HAND    Special Requests      BOTTLES DRAWN AEROBIC AND ANAEROBIC Blood Culture adequate volume   Culture      NO GROWTH < 24 HOURS Performed at [facility] [address], [place], [place] [address]    Report Status PENDING   Culture, blood (routine x 2)     Status: None (Preliminary result)  Collection Time: 03/20/18  7:59 PM  Result Value Ref Range   Specimen Description RIGHT ANTECUBITAL    Special Requests      BOTTLES DRAWN AEROBIC AND ANAEROBIC Blood Culture adequate volume   Culture      NO GROWTH < 24 HOURS Performed at [facility] [address], [place], [place] [address]    Report Status PENDING   I-stat troponin, ED     Status: None   Collection Time: 03/20/18  8:06 PM  Result Value Ref Range   Troponin i, poc 0.04 0.00 - 0.08 ng/mL   Comment 3            Comment: Due to the release kinetics of cTnI, a negative result within the first hours of the onset of symptoms does not rule out myocardial infarction with certainty. If myocardial infarction is still suspected, repeat the test at appropriate intervals.   I-Stat CG4 Lactic Acid, ED     Status: None   Collection Time: 03/20/18  8:08 PM  Result Value Ref Range   Lactic Acid, Venous 0.93 0.5 - 1.9 mmol/L  Troponin I - Now Then Q6H     Status: Abnormal   Collection Time: 03/20/18  9:48 PM  Result Value Ref Range   Troponin I 0.05 (HH) <0.03 ng/mL     Comment: CRITICAL RESULT CALLED TO, READ BACK BY AND VERIFIED WITH: [address] 03/20/2018 WBOND Performed at [facility], [place] [address], [place], [place] [address]   Type and screen [place]     Status: None   Collection Time: 03/20/18  9:49 PM  Result Value Ref Range   ABO/RH(D) A POS    Antibody Screen NEG    Sample Expiration 03/23/2018    Unit Number [id number]    Blood Component Type RBC LR PHER1    Unit division 00    Status of Unit ISSUED,FINAL    Transfusion Status OK TO TRANSFUSE    Crossmatch Result      Compatible Performed at [facility], [place] [address] [place], [place] [address]   CBG monitoring, ED     Status: Abnormal   Collection Time: 03/20/18  9:54 PM  Result Value Ref Range   Glucose-Capillary 260 (H) 70 - 99 mg/dL  Glucose, capillary     Status: Abnormal   Collection Time: 03/20/18 11:12 PM  Result Value Ref Range   Glucose-Capillary 257 (H) 70 - 99 mg/dL   Comment 1 Notify RN    Comment 2 Document in Chart   Basic metabolic panel     Status: Abnormal   Collection Time: 03/21/18  2:42 AM  Result Value Ref Range   Sodium 136 135 - 145 mmol/L   Potassium 3.7 3.5 - 5.1 mmol/L   Chloride 108 98 - 111 mmol/L   CO2 19 (L) 22 - 32 mmol/L   Glucose, Bld 147 (H) 70 - 99 mg/dL   BUN 57 (H) 8 - 23 mg/dL   Creatinine, Ser 1.82 (H) 0.44 - 1.00 mg/dL   Calcium 8.5 (L) 8.9 - 10.3 mg/dL   GFR calc non Af Amer 26 (L) >60 mL/min   GFR calc Af Amer 30 (L) >60 mL/min   Anion gap 9 5 - 15    Comment: Performed at [facility] [address] [place], [place] [address]  Troponin I - Now Then Q6H     Status: Abnormal   Collection Time:  03/21/18  2:42 AM  Result Value Ref Range   Troponin I 0.06 (HH) <0.03 ng/mL    Comment: CRITICAL VALUE NOTED.  VALUE IS CONSISTENT WITH PREVIOUSLY REPORTED AND CALLED VALUE. Performed at [facility], [place] [address], [place], [place] [address]   Troponin I - Now Then Q6H      Status: Abnormal   Collection Time: 03/21/18  6:48 AM  Result Value Ref Range   Troponin I 0.07 (HH) <0.03 ng/mL    Comment: CRITICAL VALUE NOTED.  VALUE IS CONSISTENT WITH PREVIOUSLY REPORTED AND CALLED VALUE. Performed at [facility], [place] [address], [place], [place] [address]   CBC with Differential/Platelet     Status: Abnormal   Collection Time: 03/21/18  6:48 AM  Result Value Ref Range   WBC 9.6 4.0 - 10.5 K/uL   RBC 3.01 (L) 3.87 - 5.11 MIL/uL   Hemoglobin 7.7 (L) 12.0 - 15.0 g/dL   HCT 25.3 (L) 36.0 - 46.0 %   MCV 84.1 80.0 - 100.0 fL   MCH 25.6 (L) 26.0 - 34.0 pg   MCHC 30.4 30.0 - 36.0 g/dL   RDW 17.5 (H) 11.5 - 15.5 %   Platelets 270 150 - 400 K/uL   nRBC 0.0 0.0 - 0.2 %   Neutrophils Relative % 79 %   Neutro Abs 7.5 1.7 - 7.7 K/uL   Lymphocytes Relative 7 %   Lymphs Abs 0.7 0.7 - 4.0 K/uL   Monocytes Relative 8 %   Monocytes Absolute 0.8 0.1 - 1.0 K/uL   Eosinophils Relative 5 %   Eosinophils Absolute 0.5 0.0 - 0.5 K/uL   Basophils Relative 1 %   Basophils Absolute 0.1 0.0 - 0.1 K/uL   Immature Granulocytes 0 %   Abs Immature Granulocytes 0.04 0.00 - 0.07 K/uL    Comment: Performed at [facility] [address] [place], [place] [address]  Glucose, capillary     Status: None   Collection Time: 03/21/18  7:30 AM  Result Value Ref Range   Glucose-Capillary 91 70 - 99 mg/dL  Vitamin B12     Status: None   Collection Time: 03/21/18  7:54 AM  Result Value Ref Range   Vitamin B-12 852 180 - 914 pg/mL    Comment: (NOTE) This assay is not validated for testing neonatal or myeloproliferative syndrome specimens for Vitamin B12 levels. Performed at [facility], [place] [address], [place], [place] [address]   Folate     Status: None   Collection Time: 03/21/18  7:54 AM  Result Value Ref Range   Folate 23.8 >5.9 ng/mL    Comment: Performed at [facility] [address], [place], [place] [address]  Iron and TIBC     Status: Abnormal    Collection Time: 03/21/18  7:54 AM  Result Value Ref Range   Iron 19 (L) 28 - 170 ug/dL   TIBC 211 (L) 250 - 450 ug/dL   Saturation Ratios 9 (L) 10.4 - 31.8 %   UIBC 192 ug/dL    Comment: Performed at [facility], [place] [address], [place], [place] [address]  Ferritin     Status: None   Collection Time: 03/21/18  7:54 AM  Result Value Ref Range   Ferritin 104 11 - 307 ng/mL    Comment: Performed at [facility], [place] [address], [place], [place] [address]  Reticulocytes     Status: Abnormal  Collection Time: 03/21/18  7:54 AM  Result Value Ref Range   Retic Ct Pct 1.4 0.4 - 3.1 %   RBC. 3.01 (L) 3.87 - 5.11 MIL/uL   Retic Count, Absolute 41.2 19.0 - 186.0 K/uL   Immature Retic Fract 18.2 (H) 2.3 - 15.9 %    Comment: Performed at [facility] [address], [place], [place] [address]  Prepare RBC     Status: None   Collection Time: 03/21/18  9:36 AM  Result Value Ref Range   Order Confirmation      ORDER PROCESSED BY BLOOD BANK Performed at [facility], [place] [address], [place], [place] [address]   Glucose, capillary     Status: Abnormal   Collection Time: 03/21/18 11:49 AM  Result Value Ref Range   Glucose-Capillary 149 (H) 70 - 99 mg/dL  Glucose, capillary     Status: Abnormal   Collection Time: 03/21/18  4:28 PM  Result Value Ref Range   Glucose-Capillary 242 (H) 70 - 99 mg/dL  Hemoglobin and hematocrit, blood     Status: Abnormal   Collection Time: 03/21/18  7:14 PM  Result Value Ref Range   Hemoglobin 9.1 (L) 12.0 - 15.0 g/dL   HCT 30.2 (L) 36.0 - 46.0 %    Comment: Performed at [facility], [place] [address], [place], [place] [address]  Glucose, capillary     Status: Abnormal   Collection Time: 03/21/18  9:01 PM  Result Value Ref Range   Glucose-Capillary 279 (H) 70 - 99 mg/dL  Basic metabolic panel     Status: Abnormal   Collection Time: 03/22/18  3:51 AM  Result Value Ref Range   Sodium 137 135 - 145 mmol/L   Potassium 3.8 3.5 - 5.1  mmol/L   Chloride 104 98 - 111 mmol/L   CO2 21 (L) 22 - 32 mmol/L   Glucose, Bld 171 (H) 70 - 99 mg/dL   BUN 51 (H) 8 - 23 mg/dL   Creatinine, Ser 1.66 (H) 0.44 - 1.00 mg/dL   Calcium 8.4 (L) 8.9 - 10.3 mg/dL   GFR calc non Af Amer 29 (L) >60 mL/min   GFR calc Af Amer 33 (L) >60 mL/min   Anion gap 12 5 - 15    Comment: Performed at [facility] [address], [place], [place] [address]  CBC     Status: Abnormal   Collection Time: 03/22/18  3:51 AM  Result Value Ref Range   WBC 9.4 4.0 - 10.5 K/uL   RBC 3.46 (L) 3.87 - 5.11 MIL/uL   Hemoglobin 9.0 (L) 12.0 - 15.0 g/dL   HCT 28.8 (L) 36.0 - 46.0 %   MCV 83.2 80.0 - 100.0 fL   MCH 26.0 26.0 - 34.0 pg   MCHC 31.3 30.0 - 36.0 g/dL   RDW 17.3 (H) 11.5 - 15.5 %   Platelets 259 150 - 400 K/uL   nRBC 0.0 0.0 - 0.2 %    Comment: Performed at [facility], [place] [address], [place], [place] [address]  Magnesium     Status: None   Collection Time: 03/22/18  3:51 AM  Result Value Ref Range   Magnesium 1.9 1.7 - 2.4 mg/dL    Comment: Performed at [facility] [address] [place], [place] [address]  Glucose, capillary     Status: Abnormal   Collection Time: 03/22/18  7:39 AM  Result Value Ref Range  Glucose-Capillary 151 (H) 70 - 99 mg/dL    ECG   N/A  Telemetry   Afib with CVR- Personally Reviewed  Radiology    Dg Chest Port 1 View  Result Date: 03/20/2018 CLINICAL DATA:  Shortness of breath. EXAM: PORTABLE CHEST 1 VIEW COMPARISON:  Chest x-ray dated March 13, 2018. FINDINGS: Stable cardiomegaly status post TAVR and CABG. The lungs remain hyperinflated with chronic interstitial changes. Progressive diffuse interstitial thickening throughout both lungs. No focal consolidation, pneumothorax, or large pleural effusion. No acute osseous abnormality. IMPRESSION: Worsening pulmonary interstitial edema. Electronically Signed   By: Titus Dubin M.D.   On: 03/20/2018 17:58    Cardiac Studies    N/A  Assessment   1. Principal Problem: 2.   Acute on chronic congestive heart failure ([place]) 3. Active Problems: 4.   Diabetes mellitus type 2 with peripheral artery disease ([place]) 5.   Essential hypertension 6.   Type II diabetes mellitus with renal manifestations ([place]) 7.   Hypochromic anemia 8.   S/P TAVR (transcatheter aortic valve replacement) 9.   Plan   1. Excellent response to diuretics - creatinine improving. H/H stable. Continue IV diuretics today.  Time Spent Directly with Patient:  I have spent a total of 25 minutes with the patient reviewing hospital notes, telemetry, EKGs, labs and examining the patient as well as establishing an assessment and plan that was discussed personally with the patient.  > 50% of time was spent in direct patient care.  Length of Stay:  LOS: 2 days   Pixie Casino, MD, [facility], [place] Director of the Advanced Lipid Disorders &  Cardiovascular Risk Reduction Clinic Diplomate of the American Board of Clinical Lipidology Attending Cardiologist  Direct Dial: [phone]  Fax: [phone]  Website:  [email or web address] 03/22/2018, 8:14 AM

## 2018-03-23 ENCOUNTER — Inpatient Hospital Stay (HOSPITAL_COMMUNITY): Admission: RE | Admit: 2018-03-23 | Payer: Medicare Other | Source: Ambulatory Visit

## 2018-03-23 DIAGNOSIS — I5023 Acute on chronic systolic (congestive) heart failure: Secondary | ICD-10-CM

## 2018-03-23 LAB — BASIC METABOLIC PANEL
Anion gap: 11 (ref 5–15)
BUN: 46 mg/dL — ABNORMAL HIGH (ref 8–23)
CO2: 22 mmol/L (ref 22–32)
Calcium: 8.5 mg/dL — ABNORMAL LOW (ref 8.9–10.3)
Chloride: 104 mmol/L (ref 98–111)
Creatinine, Ser: 1.83 mg/dL — ABNORMAL HIGH (ref 0.44–1.00)
GFR calc Af Amer: 30 mL/min — ABNORMAL LOW (ref >60)
GFR, EST NON AFRICAN AMERICAN: 26 mL/min — AB (ref >60)
Glucose, Bld: 131 mg/dL — ABNORMAL HIGH (ref 70–99)
Potassium: 3.7 mmol/L (ref 3.5–5.1)
Sodium: 137 mmol/L (ref 135–145)

## 2018-03-23 LAB — GLUCOSE, CAPILLARY
GLUCOSE-CAPILLARY: 100 mg/dL — AB (ref 70–99)
Glucose-Capillary: 190 mg/dL — ABNORMAL HIGH (ref 70–99)

## 2018-03-23 MED ORDER — FUROSEMIDE 40 MG PO TABS: 40.0000 mg | ORAL_TABLET | Freq: Two times a day (BID) | ORAL | Status: DC

## 2018-03-23 MED ORDER — FUROSEMIDE 40 MG PO TABS: 40.0000 mg | ORAL_TABLET | Freq: Two times a day (BID) | ORAL | 1 refills | Status: DC

## 2018-03-23 NOTE — Progress Notes (Signed)
DAILY PROGRESS NOTE   Patient Name: Kristina Summers Date of Encounter: 03/23/2018  Chief Complaint    No complaints  Patient Profile    Kristina Summers is a 82 y.o. female with a history of CAD s/p CABG in 2000, severe aortic stenosis s/p recent TAVR in 10/2017,  chronic diastolic heart failure, paroxsymal atrial fibrillation on Eliquis, CVA in 2016, hypertension, hyperlipidemia, type 2 diabetes mellitus, GERD, fibromyalgia, CKD Stage IV, and anemia requiring multiple blood transfusions recently, who is being seen today for the evaluation of CHF at the request of Dr. Cyndia Skeeters  Subjective   1.5L negative overnight - now 2.7L negative. Creatinine elevated - probably at end diuresis.  Objective   Vitals:   03/22/18 1700 03/22/18 2028 03/23/18 0357 03/23/18 0821  BP: (!) 150/60 (!) 147/52 136/68 (!) 143/59  Pulse: 82 92 88 97  Resp:  18 18 14   Temp:  98.4 F (36.9 C) 98.3 F (36.8 C) 98.8 F (37.1 C)  TempSrc:  Oral Oral Oral  SpO2:  96% 92% 94%  Weight:   80 kg   Height:        Intake/Output Summary (Last 24 hours) at 03/23/2018 1052 Last data filed at 03/23/2018 0600 Gross per 24 hour  Intake 14 ml  Output 1400 ml  Net -1386 ml   Filed Weights   03/21/18 0032 03/22/18 0336 03/23/18 0357  Weight: 82.7 kg 81.4 kg 80 kg    Physical Exam   General appearance: alert and no distress Neck: no carotid bruit, no JVD and thyroid not enlarged, symmetric, no tenderness/mass/nodules Lungs: clear to auscultation bilaterally Heart: regular rate and rhythm and systolic murmur: systolic ejection 2/6, crescendo at 2nd right intercostal space Abdomen: soft, non-tender; bowel sounds normal; no masses,  no organomegaly Extremities: edema none Pulses: 2+ and symmetric Skin: Skin color, texture, turgor normal. No rashes or lesions Neurologic: Grossly normal Psych: Pleasant  Inpatient Medications    Scheduled Meds: . amLODipine  10 mg Oral Daily  . apixaban  5 mg Oral BID  . atorvastatin  80  mg Oral q1800  . furosemide  40 mg Intravenous TID  . insulin aspart  0-5 Units Subcutaneous QHS  . insulin aspart  5 Units Subcutaneous BID WC  . insulin NPH Human  10 Units Subcutaneous QHS  . insulin NPH Human  15 Units Subcutaneous QAC breakfast  . metoprolol tartrate  25 mg Oral BID  . multivitamin with minerals  1 tablet Oral Daily  . nutrition supplement (JUVEN)  1 packet Oral BID BM  . pantoprazole  40 mg Oral Daily  . sodium chloride flush  3 mL Intravenous Q12H    Continuous Infusions: . sodium chloride      PRN Meds: sodium chloride, acetaminophen, ondansetron (ZOFRAN) IV, sodium chloride flush   Labs   Results for orders placed or performed during the hospital encounter of 03/20/18 (from the past 48 hour(s))  Glucose, capillary     Status: Abnormal   Collection Time: 03/21/18 11:49 AM  Result Value Ref Range   Glucose-Capillary 149 (H) 70 - 99 mg/dL  Glucose, capillary     Status: Abnormal   Collection Time: 03/21/18  4:28 PM  Result Value Ref Range   Glucose-Capillary 242 (H) 70 - 99 mg/dL  Hemoglobin and hematocrit, blood     Status: Abnormal   Collection Time: 03/21/18  7:14 PM  Result Value Ref Range   Hemoglobin 9.1 (L) 12.0 - 15.0 g/dL   HCT 30.2 (L)  36.0 - 46.0 %    Comment: Performed at [facility], [place] [address] [place], [place] [address]  Glucose, capillary     Status: Abnormal   Collection Time: 03/21/18  9:01 PM  Result Value Ref Range   Glucose-Capillary 279 (H) 70 - 99 mg/dL  Basic metabolic panel     Status: Abnormal   Collection Time: 03/22/18  3:51 AM  Result Value Ref Range   Sodium 137 135 - 145 mmol/L   Potassium 3.8 3.5 - 5.1 mmol/L   Chloride 104 98 - 111 mmol/L   CO2 21 (L) 22 - 32 mmol/L   Glucose, Bld 171 (H) 70 - 99 mg/dL   BUN 51 (H) 8 - 23 mg/dL   Creatinine, Ser 1.66 (H) 0.44 - 1.00 mg/dL   Calcium 8.4 (L) 8.9 - 10.3 mg/dL   GFR calc non Af Amer 29 (L) >60 mL/min   GFR calc Af Amer 33 (L) >60 mL/min   Anion gap 12  5 - 15    Comment: Performed at [facility] [address], [place], [place] [address]  CBC     Status: Abnormal   Collection Time: 03/22/18  3:51 AM  Result Value Ref Range   WBC 9.4 4.0 - 10.5 K/uL   RBC 3.46 (L) 3.87 - 5.11 MIL/uL   Hemoglobin 9.0 (L) 12.0 - 15.0 g/dL   HCT 28.8 (L) 36.0 - 46.0 %   MCV 83.2 80.0 - 100.0 fL   MCH 26.0 26.0 - 34.0 pg   MCHC 31.3 30.0 - 36.0 g/dL   RDW 17.3 (H) 11.5 - 15.5 %   Platelets 259 150 - 400 K/uL   nRBC 0.0 0.0 - 0.2 %    Comment: Performed at [facility], [place] [address] [place],  [address]  Magnesium     Status: None   Collection Time: 03/22/18  3:51 AM  Result Value Ref Range   Magnesium 1.9 1.7 - 2.4 mg/dL    Comment: Performed at [facility] [address] [place], [place] [address]  Glucose, capillary     Status: Abnormal   Collection Time: 03/22/18  7:39 AM  Result Value Ref Range   Glucose-Capillary 151 (H) 70 - 99 mg/dL  Glucose, capillary     Status: Abnormal   Collection Time: 03/22/18 11:33 AM  Result Value Ref Range   Glucose-Capillary 260 (H) 70 - 99 mg/dL  Glucose, capillary     Status: Abnormal   Collection Time: 03/22/18  4:57 PM  Result Value Ref Range   Glucose-Capillary 192 (H) 70 - 99 mg/dL  Glucose, capillary     Status: Abnormal   Collection Time: 03/22/18  9:53 PM  Result Value Ref Range   Glucose-Capillary 351 (H) 70 - 99 mg/dL  Basic metabolic panel     Status: Abnormal   Collection Time: 03/23/18  4:46 AM  Result Value Ref Range   Sodium 137 135 - 145 mmol/L   Potassium 3.7 3.5 - 5.1 mmol/L   Chloride 104 98 - 111 mmol/L   CO2 22 22 - 32 mmol/L   Glucose, Bld 131 (H) 70 - 99 mg/dL   BUN 46 (H) 8 - 23 mg/dL   Creatinine, Ser 1.83 (H) 0.44 - 1.00 mg/dL   Calcium 8.5 (L) 8.9 - 10.3 mg/dL   GFR calc non Af Amer 26 (L) >60 mL/min   GFR calc Af Amer 30 (L) >60 mL/min  Anion gap 11 5 - 15    Comment: Performed at [facility] [address] [place], [place]  [address]  Glucose, capillary     Status: Abnormal   Collection Time: 03/23/18  7:26 AM  Result Value Ref Range   Glucose-Capillary 100 (H) 70 - 99 mg/dL    ECG   N/A  Telemetry   Afib with CVR- Personally Reviewed  Radiology    No results found.  Cardiac Studies   N/A  Assessment   Principal Problem:   Acute on chronic congestive heart failure (HCC) Active Problems:   Diabetes mellitus type 2 with peripheral artery disease (HCC)   Essential hypertension   Type II diabetes mellitus with renal manifestations (HCC)   Hypochromic anemia   S/P TAVR (transcatheter aortic valve replacement)   Plan   1. Creatinine rising - hold IV lasix today. Restart po lasix 40 mg BID tomorrow. Will need outpatient follow-up of BMET early next week. Ok to d/c home from a cardiac standpoint.  Time Spent Directly with Patient:  I have spent a total of 25 minutes with the patient reviewing hospital notes, telemetry, EKGs, labs and examining the patient as well as establishing an assessment and plan that was discussed personally with the patient.  > 50% of time was spent in direct patient care.  Length of Stay:  LOS: 3 days   Pixie Casino, MD, [facility], [place] Director of the [facility] Lipid Disorders &  Cardiovascular [facility] Diplomate of the American Board of Clinical Lipidology Attending Cardiologist  Direct Dial: [phone]  Fax: [phone]  Website:  [email or web address]  03/23/2018, 10:52 AM

## 2018-03-23 NOTE — Discharge Summary (Signed)
Physician Discharge Summary  Layney Gillson TMA:[id number] DOB: 21-Jul-1935 DOA: 03/20/2018  PCP: [facility] [place] date: 03/20/2018 Discharge date: 03/23/2018  Admitted From: Home Disposition: Home Recommendations for Outpatient Follow-up:  1. Follow up with PCP in 1-2 weeks 2. Please obtain BMP/CBC 03/27/2018 Please follow up with cardiology Dr. [name]  Home Health PT Equipment/Devices none Discharge Condition stable CODE STATUS DO NOT RESUSCITATE Diet recommendation: Cardiac Brief/Interim Summary:82 year old female with history of DM-2,diastolic CHF, CKD 4, CAD status post CABG in 2000,AS status post TVAR in 10/2017,PVD with multiple lower extremity angiogram and stent,A. fib on EliquisandGI bleed status post recent EGD and capsule endoscopy admitted with worsening dyspnea thought to be due to diastolic CHF exacerbation.Chest x-ray with worsening pulmonary interstitial edema. BNP elevated to 1119 but less than baseline. Mild non-ACS pattern troponin elevation.Also concern for UTI/pyelonephritis based on urinalysis but patient without symptoms. Hemoglobin 8.2 on admission which is about baseline but trended to 7.7 the next day.     Discharge Diagnoses:  Principal Problem:   Acute on chronic congestive heart failure (HCC) Active Problems:   Diabetes mellitus type 2 with peripheral artery disease ([place])   Essential hypertension   Type II diabetes mellitus with renal manifestations (HCC)   Hypochromic anemia   S/P TAVR (transcatheter aortic valve replacement)  #1 acute on chronic diastolic CHF ejection fraction 55% from 11/24/2017- patient diuresing well on IV  Lasix 40 mg twice a day.  Renal functions increased to 1.8 from 1.6 on the day of discharge so IV Lasix was stopped and she was told to resume 40 mg of Lasix twice a day starting 03/24/2018.  #2 symptomatic anemia patient had extensive GI work-up with a recent hospitalization .  She is known to have severe iron  deficiency anemia she is status post 1 unit of packed RBC.  Hemoglobin stable today.  Continue Eliquis.  #3 A. fib rate controlled on metoprolol continue Eliquis  #4 history of TVAR 10/2017 stable  #5 stage III CKD stable creatinine 1.83 on the day of discharge follow-up BMP 03/27/2018  #6 type 2 diabetes complicated with nephropathy  #7 hypertension stable continue current medications  #8 hyperlipidemia continue statin.     Malnutrition Type:  Nutrition Problem: Increased nutrient needs Etiology: wound healing   Malnutrition Characteristics:  Signs/Symptoms: estimated needs   Nutrition Interventions:  Interventions: MVI, Juven  Estimated body mass index is 28.47 kg/m as calculated from the following:   Height as of this encounter: 5\' 6"  (1.676 m).   Weight as of this encounter: 80 kg.  Discharge Instructions  Discharge Instructions    Avoid straining   Complete by:  As directed    Call MD for:  difficulty breathing, headache or visual disturbances   Complete by:  As directed    Call MD for:  persistant nausea and vomiting   Complete by:  As directed    Call MD for:  severe uncontrolled pain   Complete by:  As directed    Diet - low sodium heart healthy   Complete by:  As directed    Heart Failure patients record your daily weight using the same scale at the same time of day   Complete by:  As directed    Increase activity slowly   Complete by:  As directed    STOP any activity that causes chest pain, shortness of breath, dizziness, sweating, or exessive weakness   Complete by:  As directed      Allergies as of  03/23/2018      Reactions   Morphine And Related Nausea And Vomiting, Other (See Comments)   Chest pain, also   Lisinopril Cough   Zoloft [sertraline Hcl] Nausea Only         Medication List    STOP taking these medications   losartan 50 MG tablet Commonly known as:  COZAAR     TAKE these medications   acetaminophen 500 MG  tablet Commonly known as:  TYLENOL Take 500 mg by mouth daily as needed for moderate pain or headache.   amLODipine 10 MG tablet Commonly known as:  NORVASC Take 10 mg by mouth daily.   apixaban 5 MG Tabs tablet Commonly known as:  ELIQUIS Take 1 tablet (5 mg total) by mouth 2 (two) times daily.   atorvastatin 80 MG tablet Commonly known as:  LIPITOR Take 1 tablet (80 mg total) by mouth daily. What changed:  when to take this   furosemide 40 MG tablet Commonly known as:  LASIX Take 1 tablet (40 mg total) by mouth 2 (two) times daily. Start tomorrow 12/6 Start taking on:  03/24/2018 What changed:    when to take this  additional instructions   glucose 4 GM chewable tablet Chew 1-4 tablets by mouth once as needed for low blood sugar.   insulin NPH Human 100 UNIT/ML injection Commonly known as:  HUMULIN N,NOVOLIN N Inject 0.15 mLs (15 Units total) into the skin 2 (two) times daily. 15 units in the morning and 10 units in the PM What changed:    how much to take  when to take this  additional instructions   insulin regular 100 units/mL injection Commonly known as:  NOVOLIN R,HUMULIN R Inject 5 Units into the skin 2 (two) times daily before a meal.   metoprolol tartrate 25 MG tablet Commonly known as:  LOPRESSOR Take 1 tablet (25 mg total) by mouth 2 (two) times daily.   multivitamin with minerals Tabs tablet Take 1 tablet by mouth daily. One a Day 50 plus   nitroGLYCERIN 0.4 MG SL tablet Commonly known as:  NITROSTAT Place 0.4 mg under the tongue every 5 (five) minutes as needed for chest pain.   pantoprazole 40 MG tablet Commonly known as:  PROTONIX Take 1 tablet (40 mg total) by mouth daily for 30 doses.   senna 8.6 MG Tabs tablet Commonly known as:  SENOKOT Take 1 tablet by mouth as needed for mild constipation.      Follow-up Information    [facility] [place] Follow up.   Why:  They will continue to do your home health care at your home Contact  information: [address] [place] [address] [phone]        [facility] Follow up.   Specialty:  [place] Why:  please check  cbc bmp 12/9 Contact information: [place] [address] [phone]        Jacolyn Reedy, MD Follow up.   Specialty:  Cardiology Contact information: [address] [place] [address] [phone]          Allergies  Allergen Reactions  . Morphine And Related Nausea And Vomiting and Other (See Comments)    Chest pain, also   . Lisinopril Cough  . Zoloft [Sertraline Hcl] Nausea Only         Consultations:   Procedures/Studies: Dg Chest 2 View  Result Date: 03/13/2018 CLINICAL DATA:  Shortness of breath EXAM: CHEST -  2 VIEW COMPARISON:  01/16/2018 FINDINGS: Cardiac shadow remains enlarged. Postsurgical changes are again seen and stable. Right-sided PICC line has been removed in the interval. Aortic calcifications are again noted. The lungs are hyperinflated with chronic interstitial changes bilaterally stable from the previous exam. Some central vascular congestion and mild interstitial edema is noted. No large pleural effusion is seen. IMPRESSION: Changes of mild CHF.  No focal infiltrate is seen. Electronically Signed   By: Inez Catalina M.D.   On: 03/13/2018 12:01   Dg Chest Port 1 View  Result Date: 03/20/2018 CLINICAL DATA:  Shortness of breath. EXAM: PORTABLE CHEST 1 VIEW COMPARISON:  Chest x-ray dated March 13, 2018. FINDINGS: Stable cardiomegaly status post TAVR and CABG. The lungs remain hyperinflated with chronic interstitial changes. Progressive diffuse interstitial thickening throughout both lungs. No focal consolidation, pneumothorax, or large pleural effusion. No acute osseous abnormality. IMPRESSION: Worsening pulmonary interstitial edema. Electronically Signed   By: Titus Dubin M.D.   On: 03/20/2018 17:58   Dg Foot 2 Views Right  Result Date:  03/13/2018 CLINICAL DATA:  Foot ulcer. EXAM: RIGHT FOOT - 2 VIEW COMPARISON:  None. FINDINGS: There is no evidence of fracture or dislocation. No radiographic evidence of osteomyelitis. Osteoarthritic changes at the first metatarsophalangeal joint. Ventral soft tissue swelling at the level of the metatarsophalangeal joints. Two linear metallic density foreign bodies within the ventral soft tissues adjacent to the first metatarsophalangeal joint. IMPRESSION: No evidence of osseous abnormalities to suggest osteomyelitis. Two linear metallic density foreign bodies within the ventral soft tissues adjacent to the first metatarsophalangeal joint. Electronically Signed   By: Fidela Salisbury M.D.   On: 03/13/2018 15:37   Vas [place] Burnard Bunting With/wo Tbi  Result Date: 03/06/2018 LOWER EXTREMITY DOPPLER STUDY Indications: Peripheral artery disease, and Peripheral vascular disease.  Vascular Interventions: Right CIA stent 04/01/11. Orbital ahterectomy and                         angioplasty x 2 on 2/28/ and 8/1 2013. TAVR 11/08/17. Performing Technologist: Ralene Cork RVT  Examination Guidelines: A complete evaluation includes at minimum, Doppler waveform signals and systolic blood pressure reading at the level of bilateral brachial, anterior tibial, and posterior tibial arteries, when vessel segments are accessible. Bilateral testing is considered an integral part of a complete examination. Photoelectric Plethysmograph (PPG) waveforms and toe systolic pressure readings are included as required and additional duplex testing as needed. Limited examinations for reoccurring indications may be performed as noted.  ABI Findings: +---------+------------------+-----+----------+--------+ Right    Rt Pressure (mmHg)IndexWaveform  Comment  +---------+------------------+-----+----------+--------+ Brachial 161                                       +---------+------------------+-----+----------+--------+ ATA      148                0.92 biphasic           +---------+------------------+-----+----------+--------+ PTA      137               0.85 monophasic         +---------+------------------+-----+----------+--------+ Great Toe86                0.53                    +---------+------------------+-----+----------+--------+ +---------+------------------+-----+--------+-------+ Left     Lt Pressure (  mmHg)IndexWaveformComment +---------+------------------+-----+--------+-------+ Brachial 154                                    +---------+------------------+-----+--------+-------+ ATA      153               0.95 biphasic        +---------+------------------+-----+--------+-------+ PTA      255               1.58 biphasic        +---------+------------------+-----+--------+-------+ Great Toe126               0.78                 +---------+------------------+-----+--------+-------+ +-------+-----------+-----------+------------+------------+ ABI/TBIToday's ABIToday's TBIPrevious ABIPrevious TBI +-------+-----------+-----------+------------+------------+ Right  0.92       0.53       0.81        0.58         +-------+-----------+-----------+------------+------------+ Left   [place]         0.78       [place]          0.69         +-------+-----------+-----------+------------+------------+ Bilateral ABIs and TBIs appear essentially unchanged.  Summary: Right: Resting right ankle-brachial index indicates mild right lower extremity arterial disease. The right toe-brachial index is abnormal. RT great toe pressure = 86 mmHg. Left: Resting left ankle-brachial index indicates noncompressible left lower extremity arteries.The left toe-brachial index is normal. TBIs are unreliable. LT Great toe pressure = 126 mmHg.  *See table(s) above for measurements and observations.  Electronically signed by Harold Barban MD on 03/06/2018 at 11:04:07 AM.   Final    Vas [place] Lower Extremity Arterial  Duplex  Result Date: 03/06/2018 LOWER EXTREMITY ARTERIAL DUPLEX STUDY Indications: Peripheral artery disease, and Peripheral vascular disease.  Vascular Interventions: Right CIA stent 04/01/11. Orbital ahterectomy and                         angioplasty x 2 on 2/28/ and 8/1 2013. TAVR 11/08/17. Current ABI:            Right: 0.92/0.53 Left: [place]/0.78 Performing Technologist: Ralene Cork RVT  Examination Guidelines: A complete evaluation includes B-mode imaging, spectral Doppler, color Doppler, and power Doppler as needed of all accessible portions of each vessel. Bilateral testing is considered an integral part of a complete examination. Limited examinations for reoccurring indications may be performed as noted.  Left Duplex Findings: +-----------+--------+-----+--------+--------+--------+            PSV cm/sRatioStenosisWaveformComments +-----------+--------+-----+--------+--------+--------+ CFA Distal 171                  biphasic         +-----------+--------+-----+--------+--------+--------+ DFA        102                  biphasic         +-----------+--------+-----+--------+--------+--------+ SFA Prox   265                  biphasic         +-----------+--------+-----+--------+--------+--------+ SFA Mid    259                  biphasic         +-----------+--------+-----+--------+--------+--------+ SFA Distal 200  biphasic         +-----------+--------+-----+--------+--------+--------+ POP Prox   161                  biphasic         +-----------+--------+-----+--------+--------+--------+ POP Distal 97                   biphasic         +-----------+--------+-----+--------+--------+--------+ ATA Distal 82                   biphasic         +-----------+--------+-----+--------+--------+--------+ PTA Distal 76                   biphasic         +-----------+--------+-----+--------+--------+--------+ PERO Distal47                    biphasic         +-----------+--------+-----+--------+--------+--------+  Summary: Left: Diffuse calcific plaque throughout the left lower extremity. Velocities throughout the SFA are in the 50-74% range by velocity.  See table(s) above for measurements and observations. Electronically signed by Harold Barban MD on 03/06/2018 at 11:44:06 AM.    Final    Vas [place] Lower Extremity Venous (dvt)  Result Date: 03/08/2018  Lower Venous Study Indications: Swelling, and Cellulitis, SOB.  Performing Technologist: Caralee Ates BA, RVT, RDMS  Examination Guidelines: A complete evaluation includes B-mode imaging, spectral Doppler, color Doppler, and power Doppler as needed of all accessible portions of each vessel. Bilateral testing is considered an integral part of a complete examination. Limited examinations for reoccurring indications may be performed as noted.  Right Venous Findings: +---------+---------------+---------+-----------+----------+-------+          CompressibilityPhasicitySpontaneityPropertiesSummary +---------+---------------+---------+-----------+----------+-------+ CFV      Full           Yes      Yes                          +---------+---------------+---------+-----------+----------+-------+ SFJ      Full           Yes      Yes                          +---------+---------------+---------+-----------+----------+-------+ FV Prox  Full           Yes      Yes                          +---------+---------------+---------+-----------+----------+-------+ FV Mid   Full           Yes      Yes                          +---------+---------------+---------+-----------+----------+-------+ FV DistalFull           Yes      Yes                          +---------+---------------+---------+-----------+----------+-------+ PFV      Full           Yes      Yes                          +---------+---------------+---------+-----------+----------+-------+ POP  Full           Yes      Yes                          +---------+---------------+---------+-----------+----------+-------+ PTV      Full           Yes      Yes                          +---------+---------------+---------+-----------+----------+-------+ PERO     Full           Yes      Yes                          +---------+---------------+---------+-----------+----------+-------+  Left Venous Findings: +---------+---------------+---------+-----------+----------+-------+          CompressibilityPhasicitySpontaneityPropertiesSummary +---------+---------------+---------+-----------+----------+-------+ CFV      Full           Yes      Yes                          +---------+---------------+---------+-----------+----------+-------+ SFJ      Full           Yes      Yes                          +---------+---------------+---------+-----------+----------+-------+ FV Prox  Full           Yes      Yes                          +---------+---------------+---------+-----------+----------+-------+ FV Mid   Full           Yes      Yes                          +---------+---------------+---------+-----------+----------+-------+ FV DistalFull           Yes      Yes                          +---------+---------------+---------+-----------+----------+-------+ PFV      Full           Yes      Yes                          +---------+---------------+---------+-----------+----------+-------+ POP      Full           Yes      Yes                          +---------+---------------+---------+-----------+----------+-------+ PTV      Full           Yes      Yes                          +---------+---------------+---------+-----------+----------+-------+ PERO     Full           Yes      Yes                          +---------+---------------+---------+-----------+----------+-------+  Summary: Right: There is no evidence of deep vein thrombosis in the  lower extremity. Left: There is no evidence of deep vein thrombosis in the lower extremity. Pulsatile flow noted throughout entire bilateral venous study.  *See table(s) above for measurements and observations. Electronically signed by Deitra Mayo MD on 03/08/2018 at 10:22:22 AM.    Final     (Echo, Carotid, EGD, Colonoscopy, ERCP)    Subjective:   Discharge Exam: Vitals:   03/23/18 0821 03/23/18 1121  BP: (!) 143/59 140/65  Pulse: 97 85  Resp: 14 18  Temp: 98.8 F (37.1 C) 98.1 F (36.7 C)  SpO2: 94% 95%   Vitals:   03/22/18 2028 03/23/18 0357 03/23/18 0821 03/23/18 1121  BP: (!) 147/52 136/68 (!) 143/59 140/65  Pulse: 92 88 97 85  Resp: 18 18 14 18   Temp: 98.4 F (36.9 C) 98.3 F (36.8 C) 98.8 F (37.1 C) 98.1 F (36.7 C)  TempSrc: Oral Oral Oral Oral  SpO2: 96% 92% 94% 95%  Weight:  80 kg    Height:        General: Pt is alert, awake, not in acute distress Cardiovascular: RRR, S1/S2 +, no rubs, no gallops Respiratory: CTA bilaterally, no wheezing, no rhonchi Abdominal: Soft, NT, ND, bowel sounds + Extremities: no edema, no cyanosis    The results of significant diagnostics from this hospitalization (including imaging, microbiology, ancillary and laboratory) are listed below for reference.     Microbiology: Recent Results (from the past 240 hour(s))  Blood Cultures x 2 sites     Status: None   Collection Time: 03/13/18  3:45 PM  Result Value Ref Range Status   Specimen Description BLOOD RIGHT ANTECUBITAL  Final   Special Requests   Final    BOTTLES DRAWN AEROBIC AND ANAEROBIC Blood Culture adequate volume   Culture   Final    NO GROWTH 5 DAYS Performed at [facility], [address] [place], [place] [address]    Report Status 03/18/2018 FINAL  Final  Blood Cultures x 2 sites     Status: None   Collection Time: 03/13/18  3:51 PM  Result Value Ref Range Status   Specimen Description BLOOD RIGHT FOREARM  Final   Special Requests   Final     BOTTLES DRAWN AEROBIC AND ANAEROBIC Blood Culture adequate volume   Culture   Final    NO GROWTH 5 DAYS Performed at [facility], [place] [address] [place], [place] [address]    Report Status 03/18/2018 FINAL  Final  MRSA PCR Screening     Status: Abnormal   Collection Time: 03/13/18  5:59 PM  Result Value Ref Range Status   MRSA by PCR POSITIVE (A) NEGATIVE Final    Comment:        The GeneXpert MRSA Assay (FDA approved for NASAL specimens only), is one component of a comprehensive MRSA colonization surveillance program. It is not intended to diagnose MRSA infection nor to guide or monitor treatment for MRSA infections. RESULT CALLED TO, READ BACK BY AND VERIFIED WITH: V.GYEKYE,RN AT 2050 BY L.PITT 03/12/18   Urine culture     Status: Abnormal   Collection Time: 03/20/18  5:21 PM  Result Value Ref Range Status   Specimen Description URINE, RANDOM  Final   Special Requests   Final    NONE Performed at [facility], [place] [address] [place], [place] [address]    Culture >=100,000 COLONIES/mL KLEBSIELLA PNEUMONIAE (A)  Final   Report Status 03/22/2018 FINAL  Final   Organism ID, Bacteria KLEBSIELLA PNEUMONIAE (A)  Final      Susceptibility   Klebsiella pneumoniae - MIC*    AMPICILLIN RESISTANT Resistant     CEFAZOLIN <=4 SENSITIVE Sensitive     CEFTRIAXONE <=1 SENSITIVE Sensitive     CIPROFLOXACIN <=0.25 SENSITIVE Sensitive     GENTAMICIN <=1 SENSITIVE Sensitive     IMIPENEM <=0.25 SENSITIVE Sensitive     NITROFURANTOIN 32 SENSITIVE Sensitive     TRIMETH/SULFA <=20 SENSITIVE Sensitive     AMPICILLIN/SULBACTAM 4 SENSITIVE Sensitive     PIP/TAZO <=4 SENSITIVE Sensitive     Extended ESBL NEGATIVE Sensitive     * >=100,000 COLONIES/mL KLEBSIELLA PNEUMONIAE  Culture, blood (routine x 2)     Status: None (Preliminary result)   Collection Time: 03/20/18  7:53 PM  Result Value Ref Range Status   Specimen Description BLOOD LEFT HAND  Final   Special Requests    Final    BOTTLES DRAWN AEROBIC AND ANAEROBIC Blood Culture adequate volume   Culture   Final    NO GROWTH 2 DAYS Performed at [facility], [address] [place], [place] [address]    Report Status PENDING  Incomplete  Culture, blood (routine x 2)     Status: None (Preliminary result)   Collection Time: 03/20/18  7:59 PM  Result Value Ref Range Status   Specimen Description RIGHT ANTECUBITAL  Final   Special Requests   Final    BOTTLES DRAWN AEROBIC AND ANAEROBIC Blood Culture adequate volume   Culture   Final    NO GROWTH 2 DAYS Performed at [facility], [place] [address] [place], [place] [address]    Report Status PENDING  Incomplete     Labs: BNP (last 3 results) Recent Labs    01/16/18 0910 03/13/18 1110 03/20/18 1721  BNP 724.9* 2,601.1* 7,893.8*   Basic Metabolic Panel: Recent Labs  Lab 03/18/18 0402 03/20/18 1721 03/21/18 0242 03/22/18 0351 03/23/18 0446  NA 133* 130* 136 137 137  K 3.5 3.9 3.7 3.8 3.7  CL 104 102 108 104 104  CO2 19* 18* 19* 21* 22  GLUCOSE 205* 359* 147* 171* 131*  BUN 73* 62* 57* 51* 46*  CREATININE 2.25* 1.97* 1.82* 1.66* 1.83*  CALCIUM 8.4* 8.3* 8.5* 8.4* 8.5*  MG  --  2.0  --  1.9  --   PHOS  --  3.0  --   --   --    Liver Function Tests: Recent Labs  Lab 03/20/18 1721  AST 52*  ALT 32  ALKPHOS 118  BILITOT 1.0  PROT 7.6  ALBUMIN 1.6*   Recent Labs  Lab 03/20/18 1721  LIPASE 22   No results for input(s): AMMONIA in the last 168 hours. CBC: Recent Labs  Lab 03/18/18 0402 03/20/18 1721 03/21/18 0648 03/21/18 1914 03/22/18 0351  WBC 10.1 9.7 9.6  --  9.4  NEUTROABS 8.3* 7.8* 7.5  --   --   HGB 8.6* 8.2* 7.7* 9.1* 9.0*  HCT 28.9* 28.2* 25.3* 30.2* 28.8*  MCV 85.8 87.3 84.1  --  83.2  PLT 276 284 270  --  259   Cardiac Enzymes: Recent Labs  Lab 03/20/18 2148 03/21/18 0242 03/21/18 0648  TROPONINI 0.05* 0.06* 0.07*   BNP: Invalid input(s): POCBNP CBG: Recent Labs  Lab 03/22/18 1133  03/22/18 1657 03/22/18 2153 03/23/18 0726 03/23/18 1230  GLUCAP 260* 192* 351* 100*  190*   D-Dimer No results for input(s): DDIMER in the last 72 hours. Hgb A1c No results for input(s): HGBA1C in the last 72 hours. Lipid Profile No results for input(s): CHOL, HDL, LDLCALC, TRIG, CHOLHDL, LDLDIRECT in the last 72 hours. Thyroid function studies Recent Labs    03/20/18 1721  TSH 1.730   Anemia work up Recent Labs    03/21/18 0754  VITAMINB12 852  FOLATE 23.8  FERRITIN 104  TIBC 211*  IRON 19*  RETICCTPCT 1.4   Urinalysis    Component Value Date/Time   COLORURINE YELLOW 03/20/2018 1721   APPEARANCEUR HAZY (A) 03/20/2018 1721   LABSPEC 1.013 03/20/2018 1721   PHURINE 6.0 03/20/2018 1721   GLUCOSEU >=500 (A) 03/20/2018 1721   HGBUR LARGE (A) 03/20/2018 1721   BILIRUBINUR NEGATIVE 03/20/2018 1721   KETONESUR NEGATIVE 03/20/2018 1721   PROTEINUR 100 (A) 03/20/2018 1721   UROBILINOGEN 1.0 05/29/2014 1052   NITRITE NEGATIVE 03/20/2018 1721   LEUKOCYTESUR MODERATE (A) 03/20/2018 1721   Sepsis Labs Invalid input(s): PROCALCITONIN,  WBC,  LACTICIDVEN Microbiology Recent Results (from the past 240 hour(s))  Blood Cultures x 2 sites     Status: None   Collection Time: 03/13/18  3:45 PM  Result Value Ref Range Status   Specimen Description BLOOD RIGHT ANTECUBITAL  Final   Special Requests   Final    BOTTLES DRAWN AEROBIC AND ANAEROBIC Blood Culture adequate volume   Culture   Final    NO GROWTH 5 DAYS Performed at [facility], [address], [place], [place] [address]    Report Status 03/18/2018 FINAL  Final  Blood Cultures x 2 sites     Status: None   Collection Time: 03/13/18  3:51 PM  Result Value Ref Range Status   Specimen Description BLOOD RIGHT FOREARM  Final   Special Requests   Final    BOTTLES DRAWN AEROBIC AND ANAEROBIC Blood Culture adequate volume   Culture   Final    NO GROWTH 5 DAYS Performed at [facility], [place] [address],  [place], [place] [address]    Report Status 03/18/2018 FINAL  Final  MRSA PCR Screening     Status: Abnormal   Collection Time: 03/13/18  5:59 PM  Result Value Ref Range Status   MRSA by PCR POSITIVE (A) NEGATIVE Final    Comment:        The GeneXpert MRSA Assay (FDA approved for NASAL specimens only), is one component of a comprehensive MRSA colonization surveillance program. It is not intended to diagnose MRSA infection nor to guide or monitor treatment for MRSA infections. RESULT CALLED TO, READ BACK BY AND VERIFIED WITH: V.GYEKYE,RN AT 2050 BY L.PITT 03/12/18   Urine culture     Status: Abnormal   Collection Time: 03/20/18  5:21 PM  Result Value Ref Range Status   Specimen Description URINE, RANDOM  Final   Special Requests   Final    NONE Performed at [facility], [place] [address] [place], [place] [address]    Culture >=100,000 COLONIES/mL KLEBSIELLA PNEUMONIAE (A)  Final   Report Status 03/22/2018 FINAL  Final   Organism ID, Bacteria KLEBSIELLA PNEUMONIAE (A)  Final      Susceptibility   Klebsiella pneumoniae - MIC*    AMPICILLIN RESISTANT Resistant     CEFAZOLIN <=4 SENSITIVE Sensitive     CEFTRIAXONE <=1 SENSITIVE Sensitive     CIPROFLOXACIN <=0.25 SENSITIVE Sensitive     GENTAMICIN <=1 SENSITIVE  Sensitive     IMIPENEM <=0.25 SENSITIVE Sensitive     NITROFURANTOIN 32 SENSITIVE Sensitive     TRIMETH/SULFA <=20 SENSITIVE Sensitive     AMPICILLIN/SULBACTAM 4 SENSITIVE Sensitive     PIP/TAZO <=4 SENSITIVE Sensitive     Extended ESBL NEGATIVE Sensitive     * >=100,000 COLONIES/mL KLEBSIELLA PNEUMONIAE  Culture, blood (routine x 2)     Status: None (Preliminary result)   Collection Time: 03/20/18  7:53 PM  Result Value Ref Range Status   Specimen Description BLOOD LEFT HAND  Final   Special Requests   Final    BOTTLES DRAWN AEROBIC AND ANAEROBIC Blood Culture adequate volume   Culture   Final    NO GROWTH 2 DAYS Performed at [facility], [place] [address] [place], [place] [address]    Report Status PENDING  Incomplete  Culture, blood (routine x 2)     Status: None (Preliminary result)   Collection Time: 03/20/18  7:59 PM  Result Value Ref Range Status   Specimen Description RIGHT ANTECUBITAL  Final   Special Requests   Final    BOTTLES DRAWN AEROBIC AND ANAEROBIC Blood Culture adequate volume   Culture   Final    NO GROWTH 2 DAYS Performed at [facility], [place] [address] [place], [place] [address]    Report Status PENDING  Incomplete     Time coordinating discharge:  33 minutes  SIGNED:   Georgette Shell, MD  [facility] 03/23/2018, 12:34 PM  If 7PM-7AM, please contact night-coverage [email or web address] Password [email or web address]

## 2018-03-23 NOTE — Plan of Care (Signed)
  Problem: Spiritual Needs Goal: Ability to function at adequate level Outcome: Progressing   Problem: Education: Goal: Knowledge of General Education information will improve Description Including pain rating scale, medication(s)/side effects and non-pharmacologic comfort measures Outcome: Progressing   Problem: Health Behavior/Discharge Planning: Goal: Ability to manage health-related needs will improve Outcome: Progressing   Problem: Activity: Goal: Risk for activity intolerance will decrease Outcome: Progressing   Problem: Nutrition: Goal: Adequate nutrition will be maintained Outcome: Progressing   Problem: Coping: Goal: Level of anxiety will decrease Outcome: Progressing   Problem: Elimination: Goal: Will not experience complications related to bowel motility Outcome: Progressing

## 2018-03-23 NOTE — Care Management Note (Signed)
Case Management Note  Patient Details  Name: Kristina Summers MRN: [id number] Date of Birth: [birth year]  Subjective/Objective:    CHF              Action/Plan: 03/23/2018- CM following for progression of care; [name]  03/14/2018 - Action/Plan: NCM spoke to pt and she has [place] with [place]. She has cane, bedside commode and Rollator at home. She recently moved into [place] apt, Her dtr, [name] is a Marine scientist. Lives alone. Contacted [facility] with [facility]. Pt active with [facility]. Contacted attending for [facility], and PT orders with [facility]. Will continue to follow up for dc needs. Derinda Sis RN  CM  Expected Discharge Date:  Possibly 03/26/2018           Expected Discharge Plan:  [place]  Discharge planning Services  CM Consult  Post Acute Care Choice:  Resumption of Svcs/PTA Provider Choice offered to:  Patient  HH Arranged:  RN, PT, Disease Management [place] Agency:  [place]  Status of Service:  In process, will continue to follow  [name] [phone] 03/23/2018, 10:36 AM

## 2018-03-23 NOTE — Progress Notes (Signed)
Physical Therapy Treatment Patient Details Name: Kristina Summers MRN: [id number] DOB: October 20, 1935 Today's Date: 03/23/2018    History of Present Illness Pt is an 82 y.o. female admitted with worsening SOB. CXR with worsening interstitial edema, BNP elevated; worked up for CHF exacerbation and UTI. Of note, recent admission for 11/25-11/30 for CHF exacerbation complicated by symptomatic anemia; had normal capsule endoscopy. PMH includes CAD s/p CABG, aortic stenosis s/p TAVR (10/2017), PVD, HF, DM2.    PT Comments    Pt progressing well with mobility. Does not require any physical assist with ADLs, transfers or ambulation with RW; only requiring supervision for safety with ambulation secondary to distracted in hallway and running into objects, but able to self-correct. SpO2 92-93% on RA while walking; DOE significantly improved.   Follow Up Recommendations  Home health PT     Equipment Recommendations  None recommended by PT    Recommendations for Other Services       Precautions / Restrictions Precautions Precautions: Fall Restrictions Weight Bearing Restrictions: No    Mobility  Bed Mobility Overal bed mobility: Independent             General bed mobility comments: Received in bathroom with NT present  Transfers Overall transfer level: Modified independent Equipment used: None;Rolling walker (2 wheeled)             General transfer comment: Mod indep standing from toilet with hand rail support; mod indep sit<>stand from recliner with RW  Ambulation/Gait Ambulation/Gait assistance: Supervision       Gait velocity: Decreased Gait velocity interpretation: 1.31 - 2.62 ft/sec, indicative of limited community ambulator General Gait Details: Pt ambulating well with RW, only requiring supervision secondary to becoming distracted in hallway causing her to run into objects, pt able to self-correct without cues; no DOE noted or rest breaks needed. SpO2 92-93% on  RA   Stairs             Wheelchair Mobility    Modified Rankin (Stroke Patients Only)       Balance Overall balance assessment: Needs assistance Sitting-balance support: No upper extremity supported;Feet supported Sitting balance-Leahy Scale: Good     Standing balance support: Single extremity supported;During functional activity Standing balance-Leahy Scale: Fair Standing balance comment: Amb in room without DME support, intermittently reaching for furniture                            Cognition Arousal/Alertness: Awake/alert Behavior During Therapy: WFL for tasks assessed/performed Overall Cognitive Status: No family/caregiver present to determine baseline cognitive functioning Area of Impairment: Attention                   Current Attention Level: Selective           General Comments: Pt ambulating well, but easily distracted (i.e. by christmas decorations on wall) then running into objects in hallway; able to self-correct, but difficulty multi-tasking      Exercises      General Comments        Pertinent Vitals/Pain Pain Assessment: No/denies pain    Home Living                      Prior Function            PT Goals (current goals can now be found in the care plan section) Acute Rehab PT Goals Patient Stated Goal: Return home PT Goal Formulation: With patient Time For  Goal Achievement: 04/04/18 Potential to Achieve Goals: Good Progress towards PT goals: Progressing toward goals    Frequency    Min 3X/week      PT Plan Current plan remains appropriate    Co-evaluation              AM-PAC PT "6 Clicks" Mobility   Outcome Measure  Help needed turning from your back to your side while in a flat bed without using bedrails?: None Help needed moving from lying on your back to sitting on the side of a flat bed without using bedrails?: None Help needed moving to and from a bed to a chair (including a  wheelchair)?: None Help needed standing up from a chair using your arms (e.g., wheelchair or bedside chair)?: None Help needed to walk in hospital room?: A Little Help needed climbing 3-5 steps with a railing? : A Little 6 Click Score: 22    End of Session Equipment Utilized During Treatment: Gait belt Activity Tolerance: Patient tolerated treatment well Patient left: in chair;with call bell/phone within reach Nurse Communication: Mobility status PT Visit Diagnosis: Other abnormalities of gait and mobility (R26.89)     Time: 5027-7412 PT Time Calculation (min) (ACUTE ONLY): 21 min  Charges:  $Gait Training: 8-22 mins                    Mabeline Caras, PT, DPT Acute Rehabilitation Services  Pager [phone] Office [phone]  Derry Lory 03/23/2018, 9:45 AM

## 2018-03-25 LAB — CULTURE, BLOOD (ROUTINE X 2)
Culture: NO GROWTH
Culture: NO GROWTH
SPECIAL REQUESTS: ADEQUATE
Special Requests: ADEQUATE

## 2018-03-27 ENCOUNTER — Ambulatory Visit (HOSPITAL_COMMUNITY): Payer: Medicare Other

## 2018-03-28 ENCOUNTER — Ambulatory Visit
Admission: RE | Admit: 2018-03-28 | Discharge: 2018-03-28 | Disposition: A | Discharge status: Home / Self Care | Payer: Medicare Other | Source: Ambulatory Visit | Attending: Internal Medicine | Admitting: Internal Medicine

## 2018-03-28 DIAGNOSIS — Z780 Asymptomatic menopausal state: Secondary | ICD-10-CM

## 2018-03-28 DIAGNOSIS — M8580 Other specified disorders of bone density and structure, unspecified site: Secondary | ICD-10-CM

## 2018-03-29 ENCOUNTER — Ambulatory Visit (HOSPITAL_COMMUNITY): Payer: Medicare Other

## 2018-03-31 ENCOUNTER — Ambulatory Visit (HOSPITAL_COMMUNITY): Payer: Medicare Other

## 2018-04-03 ENCOUNTER — Ambulatory Visit (HOSPITAL_COMMUNITY): Payer: Medicare Other

## 2018-04-05 ENCOUNTER — Ambulatory Visit (HOSPITAL_COMMUNITY): Payer: Medicare Other

## 2018-04-07 ENCOUNTER — Ambulatory Visit (HOSPITAL_COMMUNITY): Payer: Medicare Other

## 2018-04-10 ENCOUNTER — Ambulatory Visit (HOSPITAL_COMMUNITY): Payer: Medicare Other

## 2018-04-10 ENCOUNTER — Other Ambulatory Visit: Payer: Self-pay

## 2018-04-10 ENCOUNTER — Encounter (HOSPITAL_COMMUNITY): Payer: Self-pay | Admitting: Emergency Medicine

## 2018-04-10 ENCOUNTER — Emergency Department (HOSPITAL_COMMUNITY): Payer: Medicare Other

## 2018-04-10 ENCOUNTER — Inpatient Hospital Stay (HOSPITAL_COMMUNITY)
Admission: EM | Admit: 2018-04-10 | Discharge: 2018-04-15 | DRG: 252 | Disposition: A | Discharge status: Home / Self Care | Payer: Medicare Other | Attending: Internal Medicine | Admitting: Internal Medicine

## 2018-04-10 DIAGNOSIS — Z951 Presence of aortocoronary bypass graft: Secondary | ICD-10-CM | POA: Diagnosis not present

## 2018-04-10 DIAGNOSIS — L97519 Non-pressure chronic ulcer of other part of right foot with unspecified severity: Secondary | ICD-10-CM | POA: Diagnosis present

## 2018-04-10 DIAGNOSIS — N179 Acute kidney failure, unspecified: Secondary | ICD-10-CM | POA: Diagnosis present

## 2018-04-10 DIAGNOSIS — E10621 Type 1 diabetes mellitus with foot ulcer: Secondary | ICD-10-CM

## 2018-04-10 DIAGNOSIS — Z87891 Personal history of nicotine dependence: Secondary | ICD-10-CM

## 2018-04-10 DIAGNOSIS — N184 Chronic kidney disease, stage 4 (severe): Secondary | ICD-10-CM | POA: Diagnosis present

## 2018-04-10 DIAGNOSIS — E1140 Type 2 diabetes mellitus with diabetic neuropathy, unspecified: Secondary | ICD-10-CM | POA: Diagnosis present

## 2018-04-10 DIAGNOSIS — E1165 Type 2 diabetes mellitus with hyperglycemia: Secondary | ICD-10-CM | POA: Diagnosis present

## 2018-04-10 DIAGNOSIS — Z953 Presence of xenogenic heart valve: Secondary | ICD-10-CM

## 2018-04-10 DIAGNOSIS — Z794 Long term (current) use of insulin: Secondary | ICD-10-CM

## 2018-04-10 DIAGNOSIS — E1151 Type 2 diabetes mellitus with diabetic peripheral angiopathy without gangrene: Secondary | ICD-10-CM | POA: Diagnosis present

## 2018-04-10 DIAGNOSIS — Z833 Family history of diabetes mellitus: Secondary | ICD-10-CM

## 2018-04-10 DIAGNOSIS — Z66 Do not resuscitate: Secondary | ICD-10-CM | POA: Diagnosis present

## 2018-04-10 DIAGNOSIS — I480 Paroxysmal atrial fibrillation: Secondary | ICD-10-CM | POA: Diagnosis not present

## 2018-04-10 DIAGNOSIS — I10 Essential (primary) hypertension: Secondary | ICD-10-CM | POA: Diagnosis not present

## 2018-04-10 DIAGNOSIS — I509 Heart failure, unspecified: Secondary | ICD-10-CM | POA: Diagnosis present

## 2018-04-10 DIAGNOSIS — E11621 Type 2 diabetes mellitus with foot ulcer: Secondary | ICD-10-CM | POA: Diagnosis present

## 2018-04-10 DIAGNOSIS — I2510 Atherosclerotic heart disease of native coronary artery without angina pectoris: Secondary | ICD-10-CM | POA: Diagnosis present

## 2018-04-10 DIAGNOSIS — E785 Hyperlipidemia, unspecified: Secondary | ICD-10-CM | POA: Diagnosis present

## 2018-04-10 DIAGNOSIS — G9389 Other specified disorders of brain: Secondary | ICD-10-CM | POA: Diagnosis present

## 2018-04-10 DIAGNOSIS — Z8719 Personal history of other diseases of the digestive system: Secondary | ICD-10-CM

## 2018-04-10 DIAGNOSIS — Z7901 Long term (current) use of anticoagulants: Secondary | ICD-10-CM

## 2018-04-10 DIAGNOSIS — E1129 Type 2 diabetes mellitus with other diabetic kidney complication: Secondary | ICD-10-CM | POA: Diagnosis present

## 2018-04-10 DIAGNOSIS — I130 Hypertensive heart and chronic kidney disease with heart failure and stage 1 through stage 4 chronic kidney disease, or unspecified chronic kidney disease: Principal | ICD-10-CM | POA: Diagnosis present

## 2018-04-10 DIAGNOSIS — I482 Chronic atrial fibrillation, unspecified: Secondary | ICD-10-CM | POA: Diagnosis present

## 2018-04-10 DIAGNOSIS — Z8673 Personal history of transient ischemic attack (TIA), and cerebral infarction without residual deficits: Secondary | ICD-10-CM

## 2018-04-10 DIAGNOSIS — L97509 Non-pressure chronic ulcer of other part of unspecified foot with unspecified severity: Secondary | ICD-10-CM

## 2018-04-10 DIAGNOSIS — E1122 Type 2 diabetes mellitus with diabetic chronic kidney disease: Secondary | ICD-10-CM | POA: Diagnosis present

## 2018-04-10 DIAGNOSIS — I5033 Acute on chronic diastolic (congestive) heart failure: Secondary | ICD-10-CM | POA: Diagnosis present

## 2018-04-10 DIAGNOSIS — Z952 Presence of prosthetic heart valve: Secondary | ICD-10-CM

## 2018-04-10 DIAGNOSIS — M797 Fibromyalgia: Secondary | ICD-10-CM | POA: Diagnosis present

## 2018-04-10 DIAGNOSIS — R519 Headache, unspecified: Secondary | ICD-10-CM

## 2018-04-10 DIAGNOSIS — R51 Headache: Secondary | ICD-10-CM | POA: Diagnosis not present

## 2018-04-10 DIAGNOSIS — Z79899 Other long term (current) drug therapy: Secondary | ICD-10-CM

## 2018-04-10 LAB — CBC WITH DIFFERENTIAL/PLATELET
ABS IMMATURE GRANULOCYTES: 0.04 10*3/uL (ref 0.00–0.07)
Basophils Absolute: 0.1 10*3/uL (ref 0.0–0.1)
Basophils Relative: 1 %
Eosinophils Absolute: 0.3 10*3/uL (ref 0.0–0.5)
Eosinophils Relative: 3 %
HCT: 29.4 % — ABNORMAL LOW (ref 36.0–46.0)
HEMOGLOBIN: 8.8 g/dL — AB (ref 12.0–15.0)
Immature Granulocytes: 1 %
LYMPHS ABS: 0.8 10*3/uL (ref 0.7–4.0)
LYMPHS PCT: 10 %
MCH: 26 pg (ref 26.0–34.0)
MCHC: 29.9 g/dL — ABNORMAL LOW (ref 30.0–36.0)
MCV: 86.7 fL (ref 80.0–100.0)
Monocytes Absolute: 0.7 10*3/uL (ref 0.1–1.0)
Monocytes Relative: 8 %
NEUTROS ABS: 6.4 10*3/uL (ref 1.7–7.7)
Neutrophils Relative %: 77 %
Platelets: 296 10*3/uL (ref 150–400)
RBC: 3.39 MIL/uL — ABNORMAL LOW (ref 3.87–5.11)
RDW: 19.2 % — ABNORMAL HIGH (ref 11.5–15.5)
WBC: 8.2 10*3/uL (ref 4.0–10.5)
nRBC: 0 % (ref 0.0–0.2)

## 2018-04-10 LAB — BASIC METABOLIC PANEL
Anion gap: 9 (ref 5–15)
BUN: 27 mg/dL — AB (ref 8–23)
CO2: 18 mmol/L — ABNORMAL LOW (ref 22–32)
Calcium: 8.7 mg/dL — ABNORMAL LOW (ref 8.9–10.3)
Chloride: 107 mmol/L (ref 98–111)
Creatinine, Ser: 1.67 mg/dL — ABNORMAL HIGH (ref 0.44–1.00)
GFR calc Af Amer: 33 mL/min — ABNORMAL LOW (ref >60)
GFR calc non Af Amer: 29 mL/min — ABNORMAL LOW (ref >60)
GLUCOSE: 193 mg/dL — AB (ref 70–99)
Potassium: 3.7 mmol/L (ref 3.5–5.1)
Sodium: 134 mmol/L — ABNORMAL LOW (ref 135–145)

## 2018-04-10 LAB — GLUCOSE, CAPILLARY: Glucose-Capillary: 261 mg/dL — ABNORMAL HIGH (ref 70–99)

## 2018-04-10 LAB — TROPONIN I: Troponin I: 0.03 ng/mL (ref <0.03)

## 2018-04-10 LAB — BRAIN NATRIURETIC PEPTIDE: B Natriuretic Peptide: 2824.2 pg/mL — ABNORMAL HIGH (ref 0.0–100.0)

## 2018-04-10 LAB — SEDIMENTATION RATE: Sed Rate: 127 mm/hr — ABNORMAL HIGH (ref 0–22)

## 2018-04-10 MED ORDER — METOPROLOL TARTRATE 25 MG PO TABS: 25.0000 mg | ORAL_TABLET | Freq: Two times a day (BID) | ORAL | Status: DC

## 2018-04-10 MED ORDER — HYDRALAZINE HCL 20 MG/ML IJ SOLN: 10.0000 mg | Freq: Four times a day (QID) | INTRAMUSCULAR | Status: DC | PRN

## 2018-04-10 MED ORDER — FUROSEMIDE 10 MG/ML IJ SOLN
40.0000 mg | Freq: Two times a day (BID) | INTRAMUSCULAR | Status: DC
  Administered 2018-04-11 – 2018-04-12 (×4): 40 mg via INTRAVENOUS
  Filled 2018-04-10 (×4): qty 4

## 2018-04-10 MED ORDER — INSULIN NPH (HUMAN) (ISOPHANE) 100 UNIT/ML ~~LOC~~ SUSP
15.0000 [IU] | Freq: Every day | SUBCUTANEOUS | Status: DC
  Administered 2018-04-11: 15 [IU] via SUBCUTANEOUS
  Filled 2018-04-10: qty 10

## 2018-04-10 MED ORDER — PREDNISONE 20 MG PO TABS
60.0000 mg | ORAL_TABLET | Freq: Once | ORAL | Status: AC
  Administered 2018-04-10: 60 mg via ORAL
  Filled 2018-04-10: qty 3

## 2018-04-10 MED ORDER — ACETAMINOPHEN 500 MG PO TABS: 500.0000 mg | ORAL_TABLET | Freq: Every day | ORAL | Status: DC | PRN

## 2018-04-10 MED ORDER — FENTANYL CITRATE (PF) 100 MCG/2ML IJ SOLN: 25.0000 ug | INTRAMUSCULAR | Status: DC | PRN

## 2018-04-10 MED ORDER — INSULIN ASPART 100 UNIT/ML ~~LOC~~ SOLN
3.0000 [IU] | Freq: Three times a day (TID) | SUBCUTANEOUS | Status: DC
  Administered 2018-04-11 – 2018-04-12 (×6): 3 [IU] via SUBCUTANEOUS

## 2018-04-10 MED ORDER — FENTANYL CITRATE (PF) 100 MCG/2ML IJ SOLN
50.0000 ug | Freq: Once | INTRAMUSCULAR | Status: AC
  Administered 2018-04-10: 50 ug via INTRAVENOUS
  Filled 2018-04-10: qty 2

## 2018-04-10 MED ORDER — ATORVASTATIN CALCIUM 80 MG PO TABS
80.0000 mg | ORAL_TABLET | Freq: Every day | ORAL | Status: DC
  Administered 2018-04-11 – 2018-04-14 (×4): 80 mg via ORAL
  Filled 2018-04-10 (×5): qty 1

## 2018-04-10 MED ORDER — PREDNISONE 50 MG PO TABS
60.0000 mg | ORAL_TABLET | Freq: Every day | ORAL | Status: DC
  Administered 2018-04-11 – 2018-04-15 (×5): 60 mg via ORAL
  Filled 2018-04-10 (×5): qty 1

## 2018-04-10 MED ORDER — METOPROLOL TARTRATE 50 MG PO TABS
50.0000 mg | ORAL_TABLET | Freq: Two times a day (BID) | ORAL | Status: DC
  Administered 2018-04-10 – 2018-04-15 (×9): 50 mg via ORAL
  Filled 2018-04-10 (×10): qty 1

## 2018-04-10 MED ORDER — FUROSEMIDE 10 MG/ML IJ SOLN
80.0000 mg | Freq: Once | INTRAMUSCULAR | Status: AC
  Administered 2018-04-10: 80 mg via INTRAVENOUS
  Filled 2018-04-10: qty 8

## 2018-04-10 MED ORDER — TETRACAINE HCL 0.5 % OP SOLN
2.0000 [drp] | Freq: Once | OPHTHALMIC | Status: AC
  Administered 2018-04-10: 2 [drp] via OPHTHALMIC
  Filled 2018-04-10: qty 4

## 2018-04-10 MED ORDER — ADULT MULTIVITAMIN W/MINERALS CH
1.0000 | ORAL_TABLET | Freq: Every day | ORAL | Status: DC
  Administered 2018-04-11 – 2018-04-15 (×5): 1 via ORAL
  Filled 2018-04-10 (×5): qty 1

## 2018-04-10 MED ORDER — PANTOPRAZOLE SODIUM 40 MG PO TBEC
40.0000 mg | DELAYED_RELEASE_TABLET | Freq: Every day | ORAL | Status: DC
  Administered 2018-04-11 – 2018-04-15 (×5): 40 mg via ORAL
  Filled 2018-04-10 (×5): qty 1

## 2018-04-10 MED ORDER — NITROGLYCERIN 0.4 MG SL SUBL: 0.4000 mg | SUBLINGUAL_TABLET | SUBLINGUAL | Status: DC | PRN

## 2018-04-10 MED ORDER — APIXABAN 5 MG PO TABS
5.0000 mg | ORAL_TABLET | Freq: Two times a day (BID) | ORAL | Status: DC
  Filled 2018-04-10 (×2): qty 1

## 2018-04-10 MED ORDER — AMLODIPINE BESYLATE 10 MG PO TABS
10.0000 mg | ORAL_TABLET | Freq: Every day | ORAL | Status: DC
  Administered 2018-04-11 – 2018-04-15 (×5): 10 mg via ORAL
  Filled 2018-04-10 (×5): qty 1

## 2018-04-10 MED ORDER — INSULIN NPH (HUMAN) (ISOPHANE) 100 UNIT/ML ~~LOC~~ SUSP
10.0000 [IU] | Freq: Every day | SUBCUTANEOUS | Status: DC
  Administered 2018-04-11: 10 [IU] via SUBCUTANEOUS
  Filled 2018-04-10: qty 10

## 2018-04-10 MED ORDER — INSULIN NPH (HUMAN) (ISOPHANE) 100 UNIT/ML ~~LOC~~ SUSP: 15.0000 [IU] | Freq: Two times a day (BID) | SUBCUTANEOUS | Status: DC

## 2018-04-10 NOTE — ED Notes (Signed)
Pt ambulatory to and from restroom w/ steady gait.

## 2018-04-10 NOTE — ED Notes (Signed)
Vascular surgery at bedside consulting with patient.

## 2018-04-10 NOTE — ED Notes (Signed)
MD notified of elevated troponin.

## 2018-04-10 NOTE — ED Provider Notes (Signed)
[facility] EMERGENCY DEPARTMENT Provider Note   CSN: [id number] Arrival date & time: 04/10/18  1123     History   Chief Complaint Chief Complaint  Patient presents with  . Shortness of Breath    HPI Kristina Summers is a 82 y.o. female.  HPI  82 year old female presents with right-sided headache and shortness of breath.  History is taken from patient and daughter.  She was recently admitted to the hospital and got out earlier this month.  She is been weak since then but started feel little bit weaker yesterday.  Then at around 4 AM she was watching TV and developed a sudden right-sided headache over her temple.  Has been constant and severe.  She has jaw pain when she tried to eat and drink this morning.  No vomiting, new blurry vision, neck pain or stiffness, or weakness/numbness in the extremities.  She has not been confused today.  She was told that when she was at her doctor's office a week or 2 ago she had stool positive for blood.  Otherwise she has not noticed obvious GI bleeding.  She has also been short of breath since this morning and has had acute on chronic bilateral lower extremity edema.  No chest pain.  She took some Tylenol without relief for her headache.  Past Medical History:  Diagnosis Date  . Anemia   . CAD (coronary artery disease)    a. s/p CABG in 2000  . Carotid artery occlusion   . CKD (chronic kidney disease)   . Diastolic CHF ([place]) 06/5463  . Fibromyalgia   . GERD (gastroesophageal reflux disease)   . History of CVA (cerebrovascular accident) 05/24/2014  . History of GI bleed   . Hyperlipidemia   . Hypertension   . Obesity   . PAF (paroxysmal atrial fibrillation) (HCC)    a. on Eliquis  . Peripheral vascular disease ([place])   . 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 Type II diabetes mellitus ([place]) dx'd 1979     Patient Active Problem List   Diagnosis Date Noted  . Acute exacerbation of congestive heart failure ([place]) 03/20/2018  . Gastrointestinal hemorrhage   . Acute on chronic systolic CHF (congestive heart failure) ([place]) 03/13/2018  . Decreased hearing 02/03/2018  . History of myocardial infarction 02/03/2018  . Methicillin resistant Staphylococcus aureus infection 02/03/2018  . Acute blood loss anemia 12/22/2017  . MRSA bacteremia 12/12/2017  . CKD (chronic kidney disease), stage III ([place]) 12/11/2017  . UTI (urinary tract infection) 12/11/2017  . Foot ulcer, right ([place]) 12/11/2017  . 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 Severe aortic stenosis 10/11/2017  . Hypochromic anemia 08/25/2017  . Essential hypertension 06/10/2017  . Type II diabetes mellitus with renal manifestations ([place]) 06/10/2017  . Acute on chronic congestive heart failure ([place]) 01/28/2017  . Fall   . Fracture of distal end of right fibula 05/29/2014  . History of CVA (cerebrovascular accident) 05/24/2014  . Peripheral vascular disease, unspecified ([place]) 01/11/2014  . CAD (coronary artery disease)   . Paroxysmal atrial fibrillation   . Diabetes mellitus type 2 with peripheral artery disease ([place])   . Hyperlipidemia   . History of GI bleed 04/21/2009  . S/P CABG (coronary artery bypass graft) 11/20/1998    Past Surgical History:  Procedure Laterality Date  . ABDOMINAL AORTAGRAM N/A 04/01/2011   Procedure:  ABDOMINAL AORTAGRAM;  Surgeon: Conrad [place], MD;  Location: [facility] CATH LAB;  Service: Cardiovascular;  Laterality: N/A;  . ANGIOPLASTY  06/17/11   Left leg common femoral artery cannulation under u/s Left leg runoff  . CARDIAC CATHETERIZATION  10/11/2017  . CARPAL TUNNEL RELEASE Right   . CATARACT EXTRACTION W/ INTRAOCULAR LENS  IMPLANT, BILATERAL  2004-2005  . CORONARY ARTERY BYPASS GRAFT  2000   CABG X5  . ESOPHAGOGASTRODUODENOSCOPY (EGD) WITH PROPOFOL N/A 12/25/2017   Procedure:  ESOPHAGOGASTRODUODENOSCOPY (EGD) WITH PROPOFOL;  Surgeon: Wonda Horner, MD;  Location: [facility] ENDOSCOPY;  Service: Endoscopy;  Laterality: N/A;  . EYE SURGERY Left    "lasered before cataract OR"  . GIVENS CAPSULE STUDY  03/14/2018  . GIVENS CAPSULE STUDY N/A 03/14/2018   Procedure: GIVENS CAPSULE STUDY;  Surgeon: Ronald Lobo, MD;  Location: [facility];  Service: Endoscopy;  Laterality: N/A;  . LOWER EXTREMITY ANGIOGRAM Bilateral 04/01/2011   Procedure: LOWER EXTREMITY ANGIOGRAM;  Surgeon: Conrad Comfort, MD;  Location: [facility] CATH LAB;  Service: Cardiovascular;  Laterality: Bilateral;  . LOWER EXTREMITY ANGIOGRAM Left 06/17/2011   Procedure: LOWER EXTREMITY ANGIOGRAM;  Surgeon: Conrad [place], MD;  Location: [facility] CATH LAB;  Service: Cardiovascular;  Laterality: Left;  . LOWER EXTREMITY ANGIOGRAM N/A 11/18/2011   Procedure: LOWER EXTREMITY ANGIOGRAM;  Surgeon: Conrad [place], MD;  Location: [facility] CATH LAB;  Service: Cardiovascular;  Laterality: N/A;  . PERCUTANEOUS STENT INTERVENTION Right 04/01/2011   Procedure: PERCUTANEOUS STENT INTERVENTION;  Surgeon: Conrad Mosby, MD;  Location: [facility] CATH LAB;  Service: Cardiovascular;  Laterality: Right;  rt iliac stent  . PERIPHERAL ARTERIAL STENT GRAFT  04/01/11   right common iliac  . RIGHT/LEFT HEART CATH AND CORONARY/GRAFT ANGIOGRAPHY N/A 10/11/2017   Procedure: RIGHT/LEFT HEART CATH AND CORONARY/GRAFT ANGIOGRAPHY;  Surgeon: Sherren Mocha, MD;  Location: [place] CV LAB;  Service: Cardiovascular;  Laterality: N/A;  . TEE WITHOUT CARDIOVERSION N/A 11/08/2017   Procedure: TRANSESOPHAGEAL ECHOCARDIOGRAM (TEE);  Surgeon: Sherren Mocha, MD;  Location: [place] [facility];  Service: Open Heart Surgery;  Laterality: N/A;  . TEE WITHOUT CARDIOVERSION N/A 12/15/2017   Procedure: TRANSESOPHAGEAL ECHOCARDIOGRAM (TEE);  Surgeon: Larey Dresser, MD;  Location: [facility] ENDOSCOPY;  Service: Cardiovascular;  Laterality: N/A;  . TRANSCATHETER AORTIC VALVE REPLACEMENT, TRANSFEMORAL N/A 11/08/2017     Procedure: TRANSCATHETER AORTIC VALVE REPLACEMENT, TRANSFEMORAL;  Surgeon: Sherren Mocha, MD;  Location: [place];  Service: Open Heart Surgery;  Laterality: N/A;  . TRIGGER FINGER RELEASE Left 1996   thumb     OB History   No obstetric history on file.      Home Medications    Prior to Admission medications   Medication Sig Start Date End Date Taking? Authorizing Provider  acetaminophen (TYLENOL) 500 MG tablet Take 500 mg by mouth daily as needed for moderate pain or headache.   Yes [provider]  amLODipine (NORVASC) 10 MG tablet Take 10 mg by mouth daily.    Yes [provider]  apixaban (ELIQUIS) 5 MG TABS tablet Take 1 tablet (5 mg total) by mouth 2 (two) times daily. 12/26/17  Yes Shelly Coss, MD  atorvastatin (LIPITOR) 80 MG tablet Take 1 tablet (80 mg total) by mouth daily. Patient taking differently: Take by mouth daily at 6 PM.  11/27/12  Yes Baker, Freeman Caldron, PA-C  furosemide (LASIX) 40 MG tablet Take 1 tablet (40 mg total) by mouth 2 (two) times daily. Start tomorrow 12/6 Patient taking differently: Take 40 mg by mouth  2 (two) times daily.  03/24/18  Yes Georgette Shell, MD  glucose 4 GM chewable tablet Chew 1-4 tablets by mouth once as needed for low blood sugar.    Yes [provider]  insulin NPH Human (HUMULIN N,NOVOLIN N) 100 UNIT/ML injection Inject 0.15 mLs (15 Units total) into the skin 2 (two) times daily. 15 units in the morning and 10 units in the PM Patient taking differently: Inject 10-15 Units into the skin See admin instructions. Take 15 units in the morning and 10 units in the evening 10/12/17  Yes Eileen Stanford, PA-C  insulin regular (NOVOLIN R,HUMULIN R) 100 units/mL injection Inject 5 Units into the skin 2 (two) times daily before a meal.    Yes [provider]  metoprolol tartrate (LOPRESSOR) 25 MG tablet Take 1 tablet (25 mg total) by mouth 2 (two) times daily. 03/18/18 04/17/18 Yes Arrien, Jimmy Picket,  MD  Multiple Vitamin (MULTIVITAMIN WITH MINERALS) TABS tablet Take 1 tablet by mouth daily. One a Day 50 plus   Yes [provider]  nitroGLYCERIN (NITROSTAT) 0.4 MG SL tablet Place 0.4 mg under the tongue every 5 (five) minutes as needed for chest pain.   Yes [provider]  pantoprazole (PROTONIX) 40 MG tablet Take 1 tablet (40 mg total) by mouth daily for 30 doses. 03/18/18 04/17/18 Yes Arrien, Jimmy Picket, MD    Family History Family History  Problem Relation Age of Onset  . Other Brother        intestinal blockage  . Diabetes Brother     Social History Social History   Tobacco Use  . Smoking status: Former Smoker    Packs/day: 2.00    Years: 30.00    Pack years: 60.00    Types: Cigarettes    Last attempt to quit: 04/19/1990    Years since quitting: 27.9  . Smokeless tobacco: Never Used  . Tobacco comment: stopped smoking cigarettes 1991  Substance Use Topics  . Alcohol use: Not Currently    Alcohol/week: 0.0 standard drinks    Comment: "tried different alcohols when I was 1st married; never drank much at  ALL"  . Drug use: Never     Allergies   Morphine and related; Lisinopril; and Zoloft [sertraline hcl]   Review of Systems Review of Systems  Constitutional: Negative for fever.  Eyes: Negative for visual disturbance.  Respiratory: Positive for shortness of breath.   Cardiovascular: Positive for leg swelling. Negative for chest pain.  Gastrointestinal: Negative for abdominal pain and vomiting.  Musculoskeletal: Negative for neck pain and neck stiffness.  Skin: Positive for pallor.  Neurological: Positive for headaches. Negative for weakness and numbness.  All other systems reviewed and are negative.    Physical Exam Updated Vital Signs BP (!) 178/76   Pulse 85   Temp 97.6 F (36.4 C) (Oral)   Resp 15   Ht 5\' 6"  (1.676 m)   Wt 80 kg   SpO2 94%   BMI 28.47 kg/m   Physical Exam Vitals signs and nursing note reviewed.   Constitutional:      General: She is in acute distress (in pain).     Appearance: She is well-developed. She is not ill-appearing or diaphoretic.  HENT:     Head: Normocephalic and atraumatic.      Comments: Often holds hand up over right temple    Right Ear: External ear normal.     Left Ear: External ear normal.     Nose: Nose  normal.  Eyes:     General:        Right eye: No discharge.        Left eye: No discharge.     Extraocular Movements: Extraocular movements intact.     Pupils: Pupils are equal, round, and reactive to light.  Cardiovascular:     Rate and Rhythm: Normal rate and regular rhythm.     Heart sounds: Normal heart sounds.  Pulmonary:     Effort: Pulmonary effort is normal.     Breath sounds: Rales (diffuse) present.  Abdominal:     Palpations: Abdomen is soft.     Tenderness: There is no abdominal tenderness.  Musculoskeletal:     Right lower leg: Edema present.     Left lower leg: Edema present.     Comments: Pitting edema to BLE  Skin:    General: Skin is warm and dry.  Neurological:     Mental Status: She is alert and oriented to person, place, and time.     Comments: CN 3-12 grossly intact. 5/5 strength in all 4 extremities. Grossly normal sensation. Normal finger to nose.  Psychiatric:        Mood and Affect: Mood is not anxious.      ED Treatments / Results  Labs (all labs ordered are listed, but only abnormal results are displayed) Labs Reviewed  BASIC METABOLIC PANEL - Abnormal; Notable for the following components:      Result Value   Sodium 134 (*)    CO2 18 (*)    Glucose, Bld 193 (*)    BUN 27 (*)    Creatinine, Ser 1.67 (*)    Calcium 8.7 (*)    GFR calc non Af Amer 29 (*)    GFR calc Af Amer 33 (*)    All other components within normal limits  TROPONIN I - Abnormal; Notable for the following components:   Troponin I 0.03 (*)    All other components within normal limits  BRAIN NATRIURETIC PEPTIDE - Abnormal; Notable for the  following components:   B Natriuretic Peptide 2,824.2 (*)    All other components within normal limits  CBC WITH DIFFERENTIAL/PLATELET - Abnormal; Notable for the following components:   RBC 3.39 (*)    Hemoglobin 8.8 (*)    HCT 29.4 (*)    MCHC 29.9 (*)    RDW 19.2 (*)    All other components within normal limits  SEDIMENTATION RATE - Abnormal; Notable for the following components:   Sed Rate 127 (*)    All other components within normal limits    EKG EKG Interpretation  Date/Time:  Monday April 10 2018 11:35:24 EST Ventricular Rate:  100 PR Interval:    QRS Duration: 118 QT Interval:  369 QTC Calculation: 476 R Axis:   108 Text Interpretation:  Atrial fibrillation Ventricular premature complex Nonspecific intraventricular conduction delay Borderline ST elevation, anterior leads similar to Mar 20 2018 Confirmed by Sherwood Gambler [phone]) on 04/10/2018 11:42:26 AM   Radiology Dg Chest 2 View  Result Date: 04/10/2018 CLINICAL DATA:  Acute shortness of breath and chest pain EXAM: CHEST - 2 VIEW COMPARISON:  03/20/2018 FINDINGS: Artifact overlies the chest. Previous median sternotomy and CABG. Previous aortic valve replacement. Cardiomegaly. Pulmonary venous hypertension with interstitial edema. Small effusions in the posterior costophrenic angles. Mild dependent pulmonary atelectasis. IMPRESSION: Congestive heart failure with interstitial edema and small effusions. Electronically Signed   By: Nelson Chimes M.D.   On: 04/10/2018 12:29  Ct Head Wo Contrast  Result Date: 04/10/2018 CLINICAL DATA:  Severe headache. EXAM: CT HEAD WITHOUT CONTRAST TECHNIQUE: Contiguous axial images were obtained from the base of the skull through the vertex without intravenous contrast. COMPARISON:  CT head dated December 11, 2017. FINDINGS: Brain: No evidence of acute infarction, hemorrhage, hydrocephalus, extra-axial collection or mass lesion/mass effect. Unchanged left frontal encephalomalacia. Stable  mild atrophy and chronic microvascular ischemic changes. Vascular: Calcified atherosclerosis at the skullbase. No hyperdense vessel. Skull: Negative for fracture or focal abnormality. Sinuses/Orbits: No acute finding. Unchanged partial opacification of the left mastoid air cells. Other: None. IMPRESSION: 1.  No acute intracranial abnormality. 2. Stable mild atrophy and chronic microvascular ischemic changes. Unchanged old left frontal infarct. Electronically Signed   By: Titus Dubin M.D.   On: 04/10/2018 14:59    Procedures Procedures (including critical care time)  Medications Ordered in ED Medications  tetracaine (PONTOCAINE) 0.5 % ophthalmic solution 2 drop (has no administration in time range)  fentaNYL (SUBLIMAZE) injection 50 mcg (50 mcg Intravenous Given 04/10/18 1200)  furosemide (LASIX) injection 80 mg (80 mg Intravenous Given 04/10/18 1423)  predniSONE (DELTASONE) tablet 60 mg (60 mg Oral Given 04/10/18 1416)  fentaNYL (SUBLIMAZE) injection 50 mcg (50 mcg Intravenous Given 04/10/18 1418)     Initial Impression / Assessment and Plan / ED Course  I have reviewed the triage vital signs and the nursing notes.  Pertinent labs & imaging results that were available during my care of the patient were reviewed by me and considered in my medical decision making (see chart for details).     Patient has dyspnea but no distress. Appears like CHF. Will diurese. As for her headache, could be temporal arteritis. Given location, no other obvious cause, will start on prednisone. Will admit to hospitalist for further workup and treatment. IOP 20 in right eye, not c/w glaucoma.  Final Clinical Impressions(s) / ED Diagnoses   Final diagnoses:  Acute congestive heart failure, unspecified heart failure type [facility])  Temporal headache    ED Discharge Orders    None       Sherwood Gambler, MD 04/10/18 1654

## 2018-04-10 NOTE — ED Triage Notes (Signed)
Pt to ED from home, with weakness/short of breath and headache- throbbing headache in right temple.  Recently here with anemia receiving blood transfusions.

## 2018-04-10 NOTE — ED Notes (Signed)
Called CT to inquire about when pt would be transported; pt next in line, "10-15 minutes"

## 2018-04-10 NOTE — H&P (Addendum)
History and Physical    DOA: 04/10/2018  PCP: [facility] [place]  Patient coming from: home  Chief Complaint: Right-sided temporal headache since this morning  HPI: Kristina Summers is a 82 y.o. female with history h/o  DM-2,diastolic CHF, CKD 4, CAD status post CABG in 2000,AS status post TVAR in 10/2017,PVD with multiple stents,A. fib on Eliquis, CVA with speech abnormalityandGI bleed who was [facility] to 28/7/68 for diastolic CHF exacerbation and discharged with increased dose of Lasix presents now with complaints of headache and exertional dyspnea.   Patient reports that she woke up like usual around 4 AM today and was watching TV when she had sudden onset of right temporal headache, 10/10, stabbing pain, nonradiating not associated with visual changes or nausea or vomiting.  She states the pain lasted for more than 2 hours and partially relieved after receiving 2 doses of IV fentanyl here.  She currently rates her pain at 5/10.  She does have a very slow speech and states that is chronic since her last stroke in 2016. Patient states since last discharge her Lasix dosage was increased from twice daily to 3 times daily which she was taking as directed.  She reports her weight has fluctuated between 183 to 189 pounds which is much better than 196 pounds that she presented with previously.  She states she lives downhill and has to walk uphill usually and lately noticed some dyspnea although not significantly worse than prior presentation.  She denies any worsening leg swelling since last discharge. Work-up in the ED revealed elevated ESR at 127 (although was 136 in November), BNP at 2800 (was 1200 previously) chest x-ray shows pulmonary edema.  She is saturating well on room air and her blood pressure is elevated at systolic 115.  Patient also noted to have foul-smelling wound on the right foot.  She states she has not been able to afford seeing a podiatrist and has home  wound care nurse visit her twice a week for wound care dressings.  She denies any recent pustular drainage or bleeding.  Denies any fever or chills.  Denies any chest pain or palpitations.  Review of Systems: As per HPI otherwise 10 point review of systems negative.    Past Medical History:  Diagnosis Date  . Anemia   . CAD (coronary artery disease)    a. s/p CABG in 2000  . Carotid artery occlusion   . CKD (chronic kidney disease)   . Diastolic CHF ([place]) 72/6203  . Fibromyalgia   . GERD (gastroesophageal reflux disease)   . History of CVA (cerebrovascular accident) 05/24/2014  . History of GI bleed   . Hyperlipidemia   . Hypertension   . Obesity   . PAF (paroxysmal atrial fibrillation) (HCC)    a. on Eliquis  . Peripheral vascular disease ([place])   . 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 Type II diabetes mellitus ([place]) dx'd 1979    Past Surgical History:  Procedure Laterality Date  . ABDOMINAL AORTAGRAM N/A 04/01/2011   Procedure: ABDOMINAL AORTAGRAM;  Surgeon: Conrad Bloomingdale, MD;  Location[facility] CATH LAB;  Service: Cardiovascular;  Laterality: N/A;  . ANGIOPLASTY  06/17/11   Left leg common femoral artery cannulation under u/s Left leg runoff  . CARDIAC CATHETERIZATION  10/11/2017  . CARPAL TUNNEL RELEASE Right   . CATARACT EXTRACTION W/ INTRAOCULAR LENS  IMPLANT, BILATERAL  2004-2005  . CORONARY ARTERY BYPASS GRAFT  2000   CABG X5  . ESOPHAGOGASTRODUODENOSCOPY (EGD) WITH PROPOFOL N/A 12/25/2017   Procedure: ESOPHAGOGASTRODUODENOSCOPY (EGD) WITH PROPOFOL;  Surgeon: Wonda Horner, MD;  Location[facility] ENDOSCOPY;  Service: Endoscopy;  Laterality: N/A;  . EYE SURGERY Left    "lasered before cataract OR"  . GIVENS CAPSULE STUDY  03/14/2018  . GIVENS CAPSULE STUDY N/A 03/14/2018   Procedure: GIVENS CAPSULE STUDY;  Surgeon: Ronald Lobo, MD;  Location: [facility];  Service: Endoscopy;  Laterality: N/A;  . LOWER EXTREMITY ANGIOGRAM Bilateral 04/01/2011   Procedure: LOWER EXTREMITY ANGIOGRAM;  Surgeon: Conrad Donalsonville, MD;  Location: [facility] CATH LAB;  Service: Cardiovascular;  Laterality: Bilateral;  . LOWER EXTREMITY ANGIOGRAM Left 06/17/2011   Procedure: LOWER EXTREMITY ANGIOGRAM;  Surgeon: Conrad De [place]oto, MD;  Location: [facility] CATH LAB;  Service: Cardiovascular;  Laterality: Left;  . LOWER EXTREMITY ANGIOGRAM N/A 11/18/2011   Procedure: LOWER EXTREMITY ANGIOGRAM;  Surgeon: Conrad Moro, MD;  Location: [facility] CATH LAB;  Service: Cardiovascular;  Laterality: N/A;  . PERCUTANEOUS STENT INTERVENTION Right 04/01/2011   Procedure: PERCUTANEOUS STENT INTERVENTION;  Surgeon: Conrad Malta, MD;  Location: [facility] CATH LAB;  Service: Cardiovascular;  Laterality: Right;  rt iliac stent  . PERIPHERAL ARTERIAL STENT GRAFT  04/01/11   right common iliac  . RIGHT/LEFT HEART CATH AND CORONARY/GRAFT ANGIOGRAPHY N/A 10/11/2017   Procedure: RIGHT/LEFT HEART CATH AND CORONARY/GRAFT ANGIOGRAPHY;  Surgeon: Sherren Mocha, MD;  Location: [facility] CV LAB;  Service: Cardiovascular;  Laterality: N/A;  . TEE WITHOUT CARDIOVERSION N/A 11/08/2017   Procedure: TRANSESOPHAGEAL ECHOCARDIOGRAM (TEE);  Surgeon: Sherren Mocha, MD;  Location: [facility];  Service: Open Heart Surgery;  Laterality: N/A;  . TEE WITHOUT CARDIOVERSION N/A 12/15/2017   Procedure: TRANSESOPHAGEAL ECHOCARDIOGRAM (TEE);  Surgeon: Larey Dresser, MD;  Location: [facility] ENDOSCOPY;  Service: Cardiovascular;  Laterality: N/A;  . TRANSCATHETER AORTIC VALVE REPLACEMENT, TRANSFEMORAL N/A 11/08/2017   Procedure: TRANSCATHETER AORTIC VALVE REPLACEMENT, TRANSFEMORAL;  Surgeon: Sherren Mocha, MD;  Location: [facility];  Service: Open Heart Surgery;  Laterality: N/A;  . TRIGGER FINGER RELEASE Left 1996   thumb    Social history:  reports that she quit smoking about 27 years ago. Her smoking use included cigarettes. She has a 60.00 pack-year  smoking history. She has never used smokeless tobacco. She reports previous alcohol use. She reports that she does not use drugs.   Allergies  Allergen Reactions  . Morphine And Related Nausea And Vomiting and Other (See Comments)    Chest pain, also   . Lisinopril Cough  . Zoloft [Sertraline Hcl] Nausea Only         Family History  Problem Relation Age of Onset  . Other Brother        intestinal blockage  . Diabetes Brother       Prior to Admission medications   Medication Sig Start Date End Date Taking? Authorizing Provider  acetaminophen (TYLENOL) 500 MG tablet Take 500 mg by mouth daily as needed for moderate pain or headache.   Yes [provider]  amLODipine (NORVASC) 10 MG tablet Take 10 mg by mouth daily.    Yes [provider]  apixaban (ELIQUIS) 5 MG TABS tablet Take 1 tablet (5 mg total) by mouth 2 (two) times daily. 12/26/17  Yes Shelly Coss, MD  atorvastatin (LIPITOR) 80 MG tablet Take 1 tablet (80 mg total) by mouth daily. Patient taking differently: Take by mouth daily at  6 PM.  11/27/12  Yes Baker, Freeman Caldron, PA-C  furosemide (LASIX) 40 MG tablet Take 1 tablet (40 mg total) by mouth 2 (two) times daily. Start tomorrow 12/6 Patient taking differently: Take 40 mg by mouth 2 (two) times daily.  03/24/18  Yes Georgette Shell, MD  glucose 4 GM chewable tablet Chew 1-4 tablets by mouth once as needed for low blood sugar.    Yes [provider]  insulin NPH Human (HUMULIN N,NOVOLIN N) 100 UNIT/ML injection Inject 0.15 mLs (15 Units total) into the skin 2 (two) times daily. 15 units in the morning and 10 units in the PM Patient taking differently: Inject 10-15 Units into the skin See admin instructions. Take 15 units in the morning and 10 units in the evening 10/12/17  Yes Eileen Stanford, PA-C  insulin regular (NOVOLIN R,HUMULIN R) 100 units/mL injection Inject 5 Units into the skin 2 (two) times daily before a meal.    Yes [provider]  metoprolol tartrate (LOPRESSOR) 25 MG tablet Take 1 tablet (25 mg total) by mouth 2 (two) times daily. 03/18/18 04/17/18 Yes Arrien, Jimmy Picket, MD  Multiple Vitamin (MULTIVITAMIN WITH MINERALS) TABS tablet Take 1 tablet by mouth daily. One a Day 50 plus   Yes [provider]  nitroGLYCERIN (NITROSTAT) 0.4 MG SL tablet Place 0.4 mg under the tongue every 5 (five) minutes as needed for chest pain.   Yes [provider]  pantoprazole (PROTONIX) 40 MG tablet Take 1 tablet (40 mg total) by mouth daily for 30 doses. 03/18/18 04/17/18 Yes Arrien, Jimmy Picket, MD    Physical Exam: Vitals:   04/10/18 1515 04/10/18 1600 04/10/18 1615 04/10/18 1730  BP: (!) 162/97 (!) 182/75 (!) 178/76 (!) 147/89  Pulse: 86 91 85 90  Resp: _0 Temp:      TempSrc:      SpO2: 97% 98% 94% 95%  Weight:      Height:        Constitutional: NAD, calm, comfortable Eyes: PERRL, lids and conjunctivae normal ENMT: Temporal tenderness on the right side.  No scalp tenderness.  Mucous membranes are moist. Posterior pharynx clear of any exudate or lesions.Normal dentition.  Neck: normal, supple, no masses, no thyromegaly Respiratory: Bibasilar crackles on auscultation, no wheezing, no crackles. Normal respiratory effort. No accessory muscle use.  Cardiovascular: Regular rate and rhythm, no murmurs / rubs / gallops.  Trace to 1+ pitting lower extremity edema. 2+ pedal pulses. No carotid bruits.  Abdomen: no tenderness, no masses palpated. No hepatosplenomegaly. Bowel sounds positive.  Musculoskeletal: no clubbing / cyanosis. No joint deformity upper and lower extremities. Good ROM, no contractures. Normal muscle tone.  Neurologic: CN 2-12 grossly intact.  Dysarthria at baseline.  Decreased sensations to crude touch in bilateral feet. Strength 5/5 in all 4.  Psychiatric: Normal judgment and insight. Alert and oriented x 3. Normal mood.  SKIN/catheters: Right foot ulcer with  minimal drainage, no signs of acute infection like redness or pus noted..     Labs on Admission: I have personally reviewed following labs and imaging studies  CBC: Recent Labs  Lab 04/10/18 1147  WBC 8.2  NEUTROABS 6.4  HGB 8.8*  HCT 29.4*  MCV 86.7  PLT 446   Basic Metabolic Panel: Recent Labs  Lab 04/10/18 1147  NA 134*  K 3.7  CL 107  CO2 18*  GLUCOSE 193*  BUN 27*  CREATININE 1.67*  CALCIUM 8.7*   GFR: Estimated  Creatinine Clearance: 28.7 mL/min (A) (by C-G formula based on SCr of 1.67 mg/dL (H)). Liver Function Tests: No results for input(s): AST, ALT, ALKPHOS, BILITOT, PROT, ALBUMIN in the last 168 hours. No results for input(s): LIPASE, AMYLASE in the last 168 hours. No results for input(s): AMMONIA in the last 168 hours. Coagulation Profile: No results for input(s): INR, PROTIME in the last 168 hours. Cardiac Enzymes: Recent Labs  Lab 04/10/18 1147  TROPONINI 0.03*   BNP (last 3 results) No results for input(s): PROBNP in the last 8760 hours. HbA1C: No results for input(s): HGBA1C in the last 72 hours. CBG: No results for input(s): GLUCAP in the last 168 hours. Lipid Profile: No results for input(s): CHOL, HDL, LDLCALC, TRIG, CHOLHDL, LDLDIRECT in the last 72 hours. Thyroid Function Tests: No results for input(s): TSH, T4TOTAL, FREET4, T3FREE, THYROIDAB in the last 72 hours. Anemia Panel: No results for input(s): VITAMINB12, FOLATE, FERRITIN, TIBC, IRON, RETICCTPCT in the last 72 hours. Urine analysis:    Component Value Date/Time   COLORURINE YELLOW 03/20/2018 1721   APPEARANCEUR HAZY (A) 03/20/2018 1721   LABSPEC 1.013 03/20/2018 1721   PHURINE 6.0 03/20/2018 1721   GLUCOSEU >=500 (A) 03/20/2018 1721   HGBUR LARGE (A) 03/20/2018 1721   BILIRUBINUR NEGATIVE 03/20/2018 1721   KETONESUR NEGATIVE 03/20/2018 1721   PROTEINUR 100 (A) 03/20/2018 1721   UROBILINOGEN 1.0 05/29/2014 1052   NITRITE NEGATIVE 03/20/2018 1721   LEUKOCYTESUR MODERATE  (A) 03/20/2018 1721    Radiological Exams on Admission: Dg Chest 2 View  Result Date: 04/10/2018 CLINICAL DATA:  Acute shortness of breath and chest pain EXAM: CHEST - 2 VIEW COMPARISON:  03/20/2018 FINDINGS: Artifact overlies the chest. Previous median sternotomy and CABG. Previous aortic valve replacement. Cardiomegaly. Pulmonary venous hypertension with interstitial edema. Small effusions in the posterior costophrenic angles. Mild dependent pulmonary atelectasis. IMPRESSION: Congestive heart failure with interstitial edema and small effusions. Electronically Signed   By: Nelson Chimes M.D.   On: 04/10/2018 12:29   Ct Head Wo Contrast  Result Date: 04/10/2018 CLINICAL DATA:  Severe headache. EXAM: CT HEAD WITHOUT CONTRAST TECHNIQUE: Contiguous axial images were obtained from the base of the skull through the vertex without intravenous contrast. COMPARISON:  CT head dated December 11, 2017. FINDINGS: Brain: No evidence of acute infarction, hemorrhage, hydrocephalus, extra-axial collection or mass lesion/mass effect. Unchanged left frontal encephalomalacia. Stable mild atrophy and chronic microvascular ischemic changes. Vascular: Calcified atherosclerosis at the skullbase. No hyperdense vessel. Skull: Negative for fracture or focal abnormality. Sinuses/Orbits: No acute finding. Unchanged partial opacification of the left mastoid air cells. Other: None. IMPRESSION: 1.  No acute intracranial abnormality. 2. Stable mild atrophy and chronic microvascular ischemic changes. Unchanged old left frontal infarct. Electronically Signed   By: Titus Dubin M.D.   On: 04/10/2018 14:59    EKG: Independently reviewed. A fib with intraventricular conduction delay     Assessment and Plan:   1.  Right temporal headache: Given elevated ESR (although elevated in November as well and could be secondary to foot wound) and temporal tenderness, will treat as temporal arteritis with steroids and analgesics.  Discussed  with vascular surgery who will evaluate in a.m. for temporal artery biopsy.  N.p.o. after midnight.  CT head with unchanged old left frontal infarct and microvascular disease.  No acute findings.  2.  Acute on chronic diastolic CHF: ejection fraction 55% from 11/24/2017-patient's creatinine was 1.6 at prior discharge and appears to be stable.  She reports taking 40 mg  Lasix 3 times daily.  Will admit with IV Lasix 40 mg twice daily, I's and O's and daily weights.  3.  A. fib rate controlled on metoprolol continue Eliquis  4. History of GI bleed: Patient underwent endoscopy and capsule study recently.  In last admission she received 1 unit of PRBC and does have longstanding history of iron deficiency anemia.  Eliquis was resumed on prior discharge  5.  Chronic kidney disease stage III: Appears stable.  Watch on diuretics  6.  Diabetes mellitus with complications including neuropathy and foot ulcer: Patient needs outpatient podiatry follow-up.  Will consult wound care for now.  No white count or fever to suggest acute infection.  Resume Lantus and pre-meal insulin.  7. Hypertension: Resume home medications including Norvasc and metoprolol.  Will increase metoprolol dosage and have hydralazine available as needed  8. Hyperlipidemia continue statin.   DVT prophylaxis: On Eliquis  Code Status: DNR  Family Communication: Discussed with patient. Health care proxy would be  Consults called: Vascular surgery Admission status:  Patient admitted as inpatient as anticipated LOS greater than 2 midnights    Guilford Shi MD [facility] Pager [phone]  If 7PM-7AM, please contact night-coverage [email or web address] Password [facility]  04/10/2018, 5:46 PM

## 2018-04-10 NOTE — Consult Note (Signed)
[facility] [place]  Patient name: Kristina Summers MRN: [id number] DOB: 1935/09/30 Sex: female    HPI: Kristina Summers is a 82 y.o. female seen in consultation for right temporal artery biopsy to rule out temporal arteritis.  She is a very pleasant 82 year old originally from [place].  She does have a history of congestive heart failure and is admitted with CHF exacerbation.  She has a history of prior coronary artery bypass grafting in 2000 and a history of TAVR 6 months ago.  She reports she had very poor walking tolerance prior to the TAVR and has done quite well since then.  She does have baseline renal insufficiency and had recent history of GI bleed.  She reports that she began having temporal pain and tenderness for the last 24 hours.  She does have an elevated sed rate.  He does have a history of a chronic wound on her right foot with prior history of right common iliac artery angioplasty approximately 8 to 10 years ago.  Past Medical History:  Diagnosis Date  . Anemia   . CAD (coronary artery disease)    a. s/p CABG in 2000  . Carotid artery occlusion   . CKD (chronic kidney disease)   . Diastolic CHF ([employer]) 81/4481  . Fibromyalgia   . GERD (gastroesophageal reflux disease)   . History of CVA (cerebrovascular accident) 05/24/2014  . History of GI bleed   . Hyperlipidemia   . Hypertension   . Obesity   . PAF (paroxysmal atrial fibrillation) (HCC)    a. on Eliquis  . Peripheral vascular disease ([place])   . 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 Type II diabetes mellitus ([place]) dx'd 1979    Family History  Problem Relation Age of Onset  . Other Brother        intestinal blockage  . Diabetes Brother     SOCIAL HISTORY: Social History   Tobacco Use  . Smoking status: Former Smoker    Packs/day:  2.00    Years: 30.00    Pack years: 60.00    Types: Cigarettes    Last attempt to quit: 04/19/1990    Years since quitting: 27.9  . Smokeless tobacco: Never Used  . Tobacco comment: stopped smoking cigarettes 1991  Substance Use Topics  . Alcohol use: Not Currently    Alcohol/week: 0.0 standard drinks    Comment: "tried different alcohols when I was 1st married; never drank much at  ALL"    Allergies  Allergen Reactions  . Morphine And Related Nausea And Vomiting and Other (See Comments)    Chest pain, also   . Lisinopril Cough  . Zoloft [Sertraline Hcl] Nausea Only         Current Facility-Administered Medications  Medication Dose Route Frequency Provider Last Rate Last Dose  . acetaminophen (TYLENOL) tablet 500 mg  500 mg Oral Daily PRN Guilford Shi, MD      . amLODipine (NORVASC) tablet 10 mg  10 mg Oral Daily Kamineni, Neelima, MD      . apixaban (ELIQUIS) tablet 5 mg  5 mg Oral BID Kamineni, Neelima, MD      . atorvastatin (LIPITOR) tablet 80 mg  80 mg Oral QHS Kamineni, Neelima, MD      . fentaNYL (SUBLIMAZE) injection 25 mcg  25 mcg Intravenous Q4H PRN Guilford Shi, MD      .  furosemide (LASIX) injection 40 mg  40 mg Intravenous Q12H Kamineni, Neelima, MD      . hydrALAZINE (APRESOLINE) injection 10 mg  10 mg Intravenous Q6H PRN Guilford Shi, MD      . Derrill Memo ON 04/11/2018] insulin aspart (novoLOG) injection 3 Units  3 Units Subcutaneous TID WC Kamineni, Neelima, MD      . insulin NPH Human (HUMULIN N,NOVOLIN N) injection 15 Units  15 Units Subcutaneous BID Kamineni, Neelima, MD      . metoprolol tartrate (LOPRESSOR) tablet 50 mg  50 mg Oral BID Guilford Shi, MD      . multivitamin with minerals tablet 1 tablet  1 tablet Oral Daily Kamineni, Neelima, MD      . nitroGLYCERIN (NITROSTAT) SL tablet 0.4 mg  0.4 mg Sublingual Q5 min PRN Guilford Shi, MD      . pantoprazole (PROTONIX) EC tablet 40 mg  40 mg Oral Daily Guilford Shi, MD      . Derrill Memo  ON 04/11/2018] predniSONE (DELTASONE) tablet 60 mg  60 mg Oral Q breakfast Guilford Shi, MD       Current Outpatient Medications  Medication Sig Dispense Refill  . acetaminophen (TYLENOL) 500 MG tablet Take 500 mg by mouth daily as needed for moderate pain or headache.    Marland Kitchen amLODipine (NORVASC) 10 MG tablet Take 10 mg by mouth daily.     Marland Kitchen apixaban (ELIQUIS) 5 MG TABS tablet Take 1 tablet (5 mg total) by mouth 2 (two) times daily. 60 tablet 0  . atorvastatin (LIPITOR) 80 MG tablet Take 1 tablet (80 mg total) by mouth daily. (Patient taking differently: Take by mouth daily at 6 PM. ) 30 tablet 0  . furosemide (LASIX) 40 MG tablet Take 1 tablet (40 mg total) by mouth 2 (two) times daily. Start tomorrow 12/6 (Patient taking differently: Take 40 mg by mouth 2 (two) times daily. ) 60 tablet 1  . glucose 4 GM chewable tablet Chew 1-4 tablets by mouth once as needed for low blood sugar.     . insulin NPH Human (HUMULIN N,NOVOLIN N) 100 UNIT/ML injection Inject 0.15 mLs (15 Units total) into the skin 2 (two) times daily. 15 units in the morning and 10 units in the PM (Patient taking differently: Inject 10-15 Units into the skin See admin instructions. Take 15 units in the morning and 10 units in the evening) 10 mL 11  . insulin regular (NOVOLIN R,HUMULIN R) 100 units/mL injection Inject 5 Units into the skin 2 (two) times daily before a meal.     . metoprolol tartrate (LOPRESSOR) 25 MG tablet Take 1 tablet (25 mg total) by mouth 2 (two) times daily. 60 tablet 0  . Multiple Vitamin (MULTIVITAMIN WITH MINERALS) TABS tablet Take 1 tablet by mouth daily. One a Day 50 plus    . nitroGLYCERIN (NITROSTAT) 0.4 MG SL tablet Place 0.4 mg under the tongue every 5 (five) minutes as needed for chest pain.    . pantoprazole (PROTONIX) 40 MG tablet Take 1 tablet (40 mg total) by mouth daily for 30 doses. 30 tablet 0    REVIEW OF SYSTEMS:  [X]  denotes positive finding, [ ]  denotes negative finding Cardiac   Comments:  Chest pain or chest pressure:    Shortness of breath upon exertion: x   Short of breath when lying flat: x   Irregular heart rhythm: x       Vascular    Pain in calf, thigh, or hip brought on by  ambulation:    Pain in feet at night that wakes you up from your sleep:     Blood clot in your veins:    Leg swelling:           PHYSICAL EXAM: Vitals:   04/10/18 1845 04/10/18 1900 04/10/18 1915 04/10/18 1930  BP: (!) 170/73 (!) 168/66 (!) 169/92 (!) 167/68  Pulse: 92 87 88 88  Resp: 18 20 16 15   Temp:      TempSrc:      SpO2: 94% 92% 91% 91%  Weight:      Height:        GENERAL: The patient is a well-nourished female, in no acute distress. The vital signs are documented above. CARDIOVASCULAR: 2+ radial pulses bilaterally.  Some tenderness over her right temporal region.  2+ right posterior tibial pulse PULMONARY: There is good air exchange  MUSCULOSKELETAL: There are no major deformities or cyanosis. NEUROLOGIC: No focal weakness or paresthesias are detected. SKIN: Clean ulceration over the lateral aspect of her right foot. PSYCHIATRIC: The patient has a normal affect.  DATA:  Creatinine slightly elevated 1.67.  H&H 9 and 29  MEDICAL ISSUES: Patient admitted with CHF exacerbation.  Concern regarding hospital temporal arteritis.  Patient is on chronic Eliquis.  Will hold this and plan biopsy.  I will not be able to do this tomorrow due to Eliquis.  The following day is Christmas day.  Discussed with patient we will plan for Thursday, December 26    Rosetta Posner, MD [facility] Tel [phone] Pager [phone]

## 2018-04-10 NOTE — ED Notes (Signed)
Dellie Catholic (pts daughter) took pt's keys and [id number]   [phone]

## 2018-04-10 NOTE — ED Notes (Signed)
Patient transported to CT 

## 2018-04-11 ENCOUNTER — Inpatient Hospital Stay (HOSPITAL_COMMUNITY): Payer: Medicare Other

## 2018-04-11 DIAGNOSIS — I509 Heart failure, unspecified: Secondary | ICD-10-CM

## 2018-04-11 LAB — CBC
HCT: 28.5 % — ABNORMAL LOW (ref 36.0–46.0)
Hemoglobin: 8.7 g/dL — ABNORMAL LOW (ref 12.0–15.0)
MCH: 26.2 pg (ref 26.0–34.0)
MCHC: 30.5 g/dL (ref 30.0–36.0)
MCV: 85.8 fL (ref 80.0–100.0)
PLATELETS: 255 10*3/uL (ref 150–400)
RBC: 3.32 MIL/uL — ABNORMAL LOW (ref 3.87–5.11)
RDW: 19.5 % — ABNORMAL HIGH (ref 11.5–15.5)
WBC: 8.5 10*3/uL (ref 4.0–10.5)
nRBC: 0 % (ref 0.0–0.2)

## 2018-04-11 LAB — GLUCOSE, CAPILLARY
Glucose-Capillary: 290 mg/dL — ABNORMAL HIGH (ref 70–99)
Glucose-Capillary: 282 mg/dL — ABNORMAL HIGH (ref 70–99)
Glucose-Capillary: 404 mg/dL — ABNORMAL HIGH (ref 70–99)
Glucose-Capillary: 383 mg/dL — ABNORMAL HIGH (ref 70–99)
Glucose-Capillary: 344 mg/dL — ABNORMAL HIGH (ref 70–99)

## 2018-04-11 LAB — BASIC METABOLIC PANEL
Anion gap: 10 (ref 5–15)
BUN: 34 mg/dL — ABNORMAL HIGH (ref 8–23)
CO2: 21 mmol/L — AB (ref 22–32)
Calcium: 8.8 mg/dL — ABNORMAL LOW (ref 8.9–10.3)
Chloride: 106 mmol/L (ref 98–111)
Creatinine, Ser: 1.85 mg/dL — ABNORMAL HIGH (ref 0.44–1.00)
GFR calc Af Amer: 29 mL/min — ABNORMAL LOW (ref >60)
GFR calc non Af Amer: 25 mL/min — ABNORMAL LOW (ref >60)
Glucose, Bld: 328 mg/dL — ABNORMAL HIGH (ref 70–99)
Potassium: 4.6 mmol/L (ref 3.5–5.1)
Sodium: 137 mmol/L (ref 135–145)

## 2018-04-11 MED ORDER — JUVEN PO PACK
1.0000 | PACK | Freq: Two times a day (BID) | ORAL | Status: DC
  Administered 2018-04-11 – 2018-04-15 (×7): 1 via ORAL
  Filled 2018-04-11 (×9): qty 1

## 2018-04-11 MED ORDER — APIXABAN 5 MG PO TABS: 5.0000 mg | ORAL_TABLET | Freq: Two times a day (BID) | ORAL | Status: DC

## 2018-04-11 MED ORDER — INSULIN ASPART 100 UNIT/ML ~~LOC~~ SOLN
0.0000 [IU] | Freq: Every day | SUBCUTANEOUS | Status: DC
  Administered 2018-04-11 – 2018-04-12 (×2): 4 [IU] via SUBCUTANEOUS
  Administered 2018-04-13: 5 [IU] via SUBCUTANEOUS
  Administered 2018-04-14: 2 [IU] via SUBCUTANEOUS

## 2018-04-11 MED ORDER — MUPIROCIN 2 % EX OINT
TOPICAL_OINTMENT | Freq: Every day | CUTANEOUS | Status: DC
  Administered 2018-04-11 – 2018-04-15 (×6): via NASAL
  Filled 2018-04-11 (×3): qty 22

## 2018-04-11 MED ORDER — APIXABAN 2.5 MG PO TABS
2.5000 mg | ORAL_TABLET | Freq: Two times a day (BID) | ORAL | Status: DC
  Administered 2018-04-14 – 2018-04-15 (×3): 2.5 mg via ORAL
  Filled 2018-04-11 (×3): qty 1

## 2018-04-11 MED ORDER — INSULIN ASPART 100 UNIT/ML ~~LOC~~ SOLN
0.0000 [IU] | Freq: Three times a day (TID) | SUBCUTANEOUS | Status: DC
  Administered 2018-04-11: 9 [IU] via SUBCUTANEOUS

## 2018-04-11 NOTE — Progress Notes (Signed)
Patients blood sugar 404, and asymptomatic. Patient received 9 units novoLOG per sliding scale, also 3 units of novoLOG and insulin NPH 10 units. Spoke to MD to check BG again in an hour.

## 2018-04-11 NOTE — H&P (View-Only) (Signed)
Patient ID: Elesa Garman, female   DOB: [birth year], 82 y.o.   MRN: [id number] Planning right temporal artery biopsy on 04/13/2018.  Holding Eliquis until after surgery

## 2018-04-11 NOTE — Progress Notes (Signed)
Patients blood sugar checked again.  BG:383.

## 2018-04-11 NOTE — Plan of Care (Signed)
  Problem: Spiritual Needs Goal: Ability to function at adequate level Outcome: Progressing   Problem: Education: Goal: Ability to verbalize understanding of medication therapies will improve Outcome: Progressing Goal: Individualized Educational Video(s) Outcome: Progressing   Problem: Cardiac: Goal: Ability to achieve and maintain adequate cardiopulmonary perfusion will improve Outcome: Progressing

## 2018-04-11 NOTE — Progress Notes (Signed)
Initial Nutrition Assessment  DOCUMENTATION CODES:   Not applicable  INTERVENTION:   -MVI with minerals daily -1 packet Juven BID, each packet provides 80 calories, 8 grams of carbohydrate, and 14 grams of amino acids; supplement contains CaHMB, glutamine, and arginine, to promote wound healing  NUTRITION DIAGNOSIS:   Increased nutrient needs related to wound healing as evidenced by estimated needs.  GOAL:   Patient will meet greater than or equal to 90% of their needs  MONITOR:   PO intake, Supplement acceptance, Labs, Weight trends, Skin, I & O's  REASON FOR ASSESSMENT:   Malnutrition Screening Tool    ASSESSMENT:   Kristina Summers is a 82 y.o. female with history h/o  DM-2, diastolic CHF, CKD 4, CAD status post CABG in 2000, AS status post TVAR in 10/2017, PVD with multiple stents, A. fib on Eliquis, CVA with speech abnormality and GI bleed who [facility] 40/9/81 for diastolic CHF exacerbation and discharged with increased dose of Lasix presents now with complaints of headache and exertional dyspnea.    Pt admitted with rt temporal headache.   Pt sleeping soundly at time of visit. Did not arouse when name was called.   Pt very familiar to this RD from multiple prior admissions. Pt with good appetite. PTA, pt consumed 3 meals per day (Breakfast: coffee, 2 slices of wheat toast, eggs, bacon, and orange juice; Lunch: homemade lentil soup; Dinner: two egg rolls). Noted pt consumed 90% of breakfast per doc flowsheets.  Reviewed wt hx; noted pt has experienced a 5.6% wt loss over the past 3 months, which is not significant for time frame. Pt with less edema from previous exams which may be cause of wt loss.   Per vascular surgery notes, plan for rt temporary artery biopsy on 04/13/18.   Last Hgb A1c: 6.0 (10/25/17). PTA DM medications are 5 units regular insulin BID, 15 units insulin NPH at breakfast and 10 units insulin NPH at dinner .   Labs  reviewed: CBGS: 261-290 (inpatient orders for glycemic control are 3 units insulin aspart TID, 10 units insulin NPH before supper, and 15 units insu;in NPO before breakfast).   NUTRITION - FOCUSED PHYSICAL EXAM:    Most Recent Value  Orbital Region  No depletion  Upper Arm Region  No depletion  Thoracic and Lumbar Region  No depletion  Buccal Region  No depletion  Temple Region  No depletion  Clavicle Bone Region  No depletion  Clavicle and Acromion Bone Region  No depletion  Scapular Bone Region  No depletion  Dorsal Hand  No depletion  Patellar Region  No depletion  Anterior Thigh Region  No depletion  Posterior Calf Region  No depletion  Edema (RD Assessment)  None  Hair  Reviewed  Eyes  Reviewed  Mouth  Reviewed  Skin  Reviewed  Nails  Reviewed       Diet Order:   Diet Order            Diet heart healthy/carb modified Room service appropriate? Yes; Fluid consistency: Thin  Diet effective now              EDUCATION NEEDS:   No education needs have been identified at this time  Skin:  Skin Assessment: Skin Integrity Issues: Skin Integrity Issues:: Diabetic Ulcer Diabetic Ulcer: rt lateral foot, extending to plantar surface  Last BM:  04/11/18  Height:   Ht Readings from Last 1 Encounters:  04/10/18 5\' 6"  (1.676 m)  Weight:   Wt Readings from Last 1 Encounters:  04/11/18 81.3 kg    Ideal Body Weight:  59.1 kg  BMI:  Body mass index is 28.92 kg/m.  Estimated Nutritional Needs:   Kcal:  1600-1800  Protein:  80-95 grams  Fluid:  >1.6 L    Britzy Graul A. Jimmye Norman, RD, LDN, CDE Pager: [phone] After hours Pager: [phone]

## 2018-04-11 NOTE — Progress Notes (Signed)
Patient ID: Kristina Summers, female   DOB: 1935/06/11, 82 y.o.   MRN: [id number] Planning right temporal artery biopsy on 04/13/2018.  Holding Eliquis until after surgery

## 2018-04-11 NOTE — Consult Note (Signed)
[place] Nurse wound consult note Reason for Consult:Right lateral foot, extends to plantar surface Wound type: vascular Pressure Injury POA: NA Measurement: 1 cm x 1.5 cm x 0.2 cm  Wound bed: pale pink nongranulating   Drainage (amount, consistency, odor) minimal serosanguinous  No odor Periwound: intact Dressing procedure/placement/frequency: Cleanse right foot wound with NS and pat dry.  Apply mupirocin ointment to wound bed Cover with foam dressing.  Re-apply daily.  Change foam every three days and PRN soilage.  Will not follow at this time.  Please re-consult if needed.  Domenic Moras MSN, RN, FNP-BC CWON Wound, Ostomy, Continence Nurse Pager [phone]

## 2018-04-11 NOTE — Progress Notes (Signed)
PROGRESS NOTE    Kristina Summers  XYV:[id number] DOB: [birth year] DOA: 04/10/2018 PCP: [facility], [place]    Brief Narrative:   Kristina Summers is a 82 y.o. female with history h/o DM-2,diastolic CHF, CKD 4, CAD status post CABG in 2000,AS status post TVAR in 10/2017,PVD with multiple stents,A. fib on Eliquis, CVA with speech abnormalityandGI bleed who [facility] to 28/6/38 for diastolic CHF exacerbation and discharged with increased dose of Lasix presents now with complaints of headache and exertional dyspnea.     Work-up in the ED revealed elevated ESR at 127 (although was 136 in November), BNP at 2800 (was 1200 previously) chest x-ray shows pulmonary edema.  She is saturating well on room air and her blood pressure is elevated at systolic 177.  Patient also noted to have foul-smelling wound on the right foot.  She states she has not been able to afford seeing a podiatrist and has home wound care nurse visit her twice a week for wound care dressings.  She denies any recent pustular drainage or bleeding.    Assessment & Plan:   Active Problems:   CHF exacerbation (HCC)   Acute on chronic diastolic CHF: Started her on IV lasix 40 mg BID.  Strict intake and outptut and daily weights.  Only about 700 ml output last night.  Resume BB 50 mg BID.     Atrial fibrillation , permanent and rate controlled.  - on eliquis for anti coagulation and BB for rate controle.     H/o GI bleed.  S/p EGD and capsule study.  No obvious signs of bleeding this admission.    Temporal headache.  Vascular surgery consulted for biopsy of the temporal artery to evalaute for temporal arteritis.  On prednisone.    Hypertension:  Well controlled.    Hyperlipidemia:  Resume statin.    Stage 3 CKD: Monitor creatinine while on lasix.  DM:  CBG (last 3)  Recent Labs    04/10/18 2048 04/11/18 0758 04/11/18 1156  GLUCAP 261* 290* 282*   Resume SSI, along with  Humulin 15 daily for 10 at night.    Right foot ulcer:  Chronic, will need podiatry follow up on discharge.  Wound care consulted and recommendations given.  Get X RAY of the right foot.    DVT prophylaxis:Eliquis.  Code Status: DNR  Family Communication: none at bedside.  Disposition Plan: pending clinical improvement and temporal artery biopsy.    Consultants:   Vascular surgery.   Procedures: None.   Antimicrobials: none.    Subjective: Reports her headache may be a slight better, but not resolved.   Objective: Vitals:   04/11/18 0457 04/11/18 0542 04/11/18 0820 04/11/18 1213  BP:  138/69 136/67 (!) 140/58  Pulse:  78 75 63  Resp:  _0 Temp:  97.7 F (36.5 C)  97.8 F (36.6 C)  TempSrc:  Oral  Oral  SpO2:  96% 96% 93%  Weight: 81.3 kg     Height:        Intake/Output Summary (Last 24 hours) at 04/11/2018 1235 Last data filed at 04/11/2018 1028 Gross per 24 hour  Intake 660 ml  Output 901 ml  Net -241 ml   Filed Weights   04/10/18 1136 04/10/18 2019 04/11/18 0457  Weight: 80 kg 82.1 kg 81.3 kg    Examination:  General exam: Appears calm and comfortable  Respiratory system: Clear to auscultation. Respiratory effort normal. Cardiovascular system: S1 & S2 heard,  irregular.  Gastrointestinal system: Abdomen is nondistended, soft and nontender. No organomegaly or masses felt. Normal bowel sounds heard. Central nervous system: Alert and oriented to place and person.  Extremities:right foot ulcer on the plantar aspet.  Skin: No rashes, lesions or ulcers Psychiatry: Mood & affect appropriate.     Data Reviewed: I have personally reviewed following labs and imaging studies  CBC: Recent Labs  Lab 04/10/18 1147 04/11/18 0713  WBC 8.2 8.5  NEUTROABS 6.4  --   HGB 8.8* 8.7*  HCT 29.4* 28.5*  MCV 86.7 85.8  PLT 296 470   Basic Metabolic Panel: Recent Labs  Lab 04/10/18 1147 04/11/18 0713  NA 134* 137  K 3.7 4.6  CL 107 106  CO2 18*  21*  GLUCOSE 193* 328*  BUN 27* 34*  CREATININE 1.67* 1.85*  CALCIUM 8.7* 8.8*   GFR: Estimated Creatinine Clearance: 26.1 mL/min (A) (by C-G formula based on SCr of 1.85 mg/dL (H)). Liver Function Tests: No results for input(s): AST, ALT, ALKPHOS, BILITOT, PROT, ALBUMIN in the last 168 hours. No results for input(s): LIPASE, AMYLASE in the last 168 hours. No results for input(s): AMMONIA in the last 168 hours. Coagulation Profile: No results for input(s): INR, PROTIME in the last 168 hours. Cardiac Enzymes: Recent Labs  Lab 04/10/18 1147  TROPONINI 0.03*   BNP (last 3 results) No results for input(s): PROBNP in the last 8760 hours. HbA1C: No results for input(s): HGBA1C in the last 72 hours. CBG: Recent Labs  Lab 04/10/18 2048 04/11/18 0758  GLUCAP 261* 290*   Lipid Profile: No results for input(s): CHOL, HDL, LDLCALC, TRIG, CHOLHDL, LDLDIRECT in the last 72 hours. Thyroid Function Tests: No results for input(s): TSH, T4TOTAL, FREET4, T3FREE, THYROIDAB in the last 72 hours. Anemia Panel: No results for input(s): VITAMINB12, FOLATE, FERRITIN, TIBC, IRON, RETICCTPCT in the last 72 hours. Sepsis Labs: No results for input(s): PROCALCITON, LATICACIDVEN in the last 168 hours.  No results found for this or any previous visit (from the past 240 hour(s)).       Radiology Studies: Dg Chest 2 View  Result Date: 04/10/2018 CLINICAL DATA:  Acute shortness of breath and chest pain EXAM: CHEST - 2 VIEW COMPARISON:  03/20/2018 FINDINGS: Artifact overlies the chest. Previous median sternotomy and CABG. Previous aortic valve replacement. Cardiomegaly. Pulmonary venous hypertension with interstitial edema. Small effusions in the posterior costophrenic angles. Mild dependent pulmonary atelectasis. IMPRESSION: Congestive heart failure with interstitial edema and small effusions. Electronically Signed   By: Nelson Chimes M.D.   On: 04/10/2018 12:29   Ct Head Wo Contrast  Result Date:  04/10/2018 CLINICAL DATA:  Severe headache. EXAM: CT HEAD WITHOUT CONTRAST TECHNIQUE: Contiguous axial images were obtained from the base of the skull through the vertex without intravenous contrast. COMPARISON:  CT head dated December 11, 2017. FINDINGS: Brain: No evidence of acute infarction, hemorrhage, hydrocephalus, extra-axial collection or mass lesion/mass effect. Unchanged left frontal encephalomalacia. Stable mild atrophy and chronic microvascular ischemic changes. Vascular: Calcified atherosclerosis at the skullbase. No hyperdense vessel. Skull: Negative for fracture or focal abnormality. Sinuses/Orbits: No acute finding. Unchanged partial opacification of the left mastoid air cells. Other: None. IMPRESSION: 1.  No acute intracranial abnormality. 2. Stable mild atrophy and chronic microvascular ischemic changes. Unchanged old left frontal infarct. Electronically Signed   By: Titus Dubin M.D.   On: 04/10/2018 14:59        Scheduled Meds: . amLODipine  10 mg Oral Daily  . [START ON 04/13/2018]  apixaban  5 mg Oral BID  . atorvastatin  80 mg Oral QHS  . furosemide  40 mg Intravenous Q12H  . insulin aspart  3 Units Subcutaneous TID WC  . insulin NPH Human  10 Units Subcutaneous QAC supper  . insulin NPH Human  15 Units Subcutaneous QAC breakfast  . metoprolol tartrate  50 mg Oral BID  . multivitamin with minerals  1 tablet Oral Daily  . mupirocin ointment   Nasal Daily  . pantoprazole  40 mg Oral Daily  . predniSONE  60 mg Oral Q breakfast   Continuous Infusions:   LOS: 1 day    Time spent:38 minutes.     Hosie Poisson, MD [facility] Pager [phone] If 7PM-7AM, please contact night-coverage [email or web address] Password [facility] 04/11/2018, 12:35 PM

## 2018-04-12 ENCOUNTER — Other Ambulatory Visit: Payer: Self-pay

## 2018-04-12 LAB — CBC
HEMATOCRIT: 28.4 % — AB (ref 36.0–46.0)
Hemoglobin: 8.8 g/dL — ABNORMAL LOW (ref 12.0–15.0)
MCH: 26.7 pg (ref 26.0–34.0)
MCHC: 31 g/dL (ref 30.0–36.0)
MCV: 86.1 fL (ref 80.0–100.0)
Platelets: 280 10*3/uL (ref 150–400)
RBC: 3.3 MIL/uL — ABNORMAL LOW (ref 3.87–5.11)
RDW: 20.1 % — AB (ref 11.5–15.5)
WBC: 12.1 10*3/uL — ABNORMAL HIGH (ref 4.0–10.5)
nRBC: 0 % (ref 0.0–0.2)

## 2018-04-12 LAB — GLUCOSE, CAPILLARY
Glucose-Capillary: 218 mg/dL — ABNORMAL HIGH (ref 70–99)
Glucose-Capillary: 219 mg/dL — ABNORMAL HIGH (ref 70–99)
Glucose-Capillary: 237 mg/dL — ABNORMAL HIGH (ref 70–99)
Glucose-Capillary: 313 mg/dL — ABNORMAL HIGH (ref 70–99)
Glucose-Capillary: 344 mg/dL — ABNORMAL HIGH (ref 70–99)

## 2018-04-12 LAB — BASIC METABOLIC PANEL
Anion gap: 7 (ref 5–15)
BUN: 59 mg/dL — ABNORMAL HIGH (ref 8–23)
CO2: 23 mmol/L (ref 22–32)
CREATININE: 1.97 mg/dL — AB (ref 0.44–1.00)
Calcium: 8.7 mg/dL — ABNORMAL LOW (ref 8.9–10.3)
Chloride: 101 mmol/L (ref 98–111)
GFR calc Af Amer: 27 mL/min — ABNORMAL LOW (ref >60)
GFR calc non Af Amer: 23 mL/min — ABNORMAL LOW (ref >60)
Glucose, Bld: 237 mg/dL — ABNORMAL HIGH (ref 70–99)
Potassium: 4.2 mmol/L (ref 3.5–5.1)
Sodium: 131 mmol/L — ABNORMAL LOW (ref 135–145)

## 2018-04-12 MED ORDER — INSULIN ASPART 100 UNIT/ML ~~LOC~~ SOLN
0.0000 [IU] | Freq: Three times a day (TID) | SUBCUTANEOUS | Status: DC
  Administered 2018-04-12: 7 [IU] via SUBCUTANEOUS
  Administered 2018-04-12: 4 [IU] via SUBCUTANEOUS
  Administered 2018-04-13 (×2): 11 [IU] via SUBCUTANEOUS

## 2018-04-12 MED ORDER — INSULIN NPH (HUMAN) (ISOPHANE) 100 UNIT/ML ~~LOC~~ SUSP
18.0000 [IU] | Freq: Every day | SUBCUTANEOUS | Status: DC
  Administered 2018-04-12: 18 [IU] via SUBCUTANEOUS
  Filled 2018-04-12: qty 10

## 2018-04-12 MED ORDER — CEFAZOLIN SODIUM-DEXTROSE 1-4 GM/50ML-% IV SOLN
1.0000 g | INTRAVENOUS | Status: AC
  Administered 2018-04-13: 1 g via INTRAVENOUS
  Filled 2018-04-12: qty 50

## 2018-04-12 MED ORDER — DOXYCYCLINE HYCLATE 100 MG PO TABS
100.0000 mg | ORAL_TABLET | Freq: Two times a day (BID) | ORAL | Status: DC
  Administered 2018-04-12 – 2018-04-15 (×7): 100 mg via ORAL
  Filled 2018-04-12 (×7): qty 1

## 2018-04-12 MED ORDER — INSULIN NPH (HUMAN) (ISOPHANE) 100 UNIT/ML ~~LOC~~ SUSP
12.0000 [IU] | Freq: Every day | SUBCUTANEOUS | Status: DC
  Administered 2018-04-13 – 2018-04-14 (×2): 12 [IU] via SUBCUTANEOUS
  Filled 2018-04-12: qty 10

## 2018-04-12 NOTE — Progress Notes (Signed)
PROGRESS NOTE    Kristina Summers  ZVJ:[id number] DOB: [birth year] DOA: 04/10/2018 PCP: [facility], [place]    Brief Narrative:   Kristina Summers is a 82 y.o. female with history h/o DM-2,diastolic CHF, CKD 4, CAD status post CABG in 2000,AS status post TVAR in 10/2017,PVD with multiple stents,A. fib on Eliquis, CVA with speech abnormalityandGI bleed who [facility] to 15/3/79 for diastolic CHF exacerbation and discharged with increased dose of Lasix presents now with complaints of headache and exertional dyspnea.     Work-up in the ED revealed elevated ESR at 127 (although was 136 in November), BNP at 2800 (was 1200 previously) chest x-ray shows pulmonary edema.  She is saturating well on room air and her blood pressure is elevated at systolic 432.  Patient also noted to have foul-smelling wound on the right foot.  She states she has not been able to afford seeing a podiatrist and has home wound care nurse visit her twice a week for wound care dressings.  She denies any recent pustular drainage or bleeding.    Assessment & Plan:   Active Problems:   CHF exacerbation (HCC)   Acute on chronic diastolic CHF: Started her on IV lasix 40 mg BID.  Strict intake and outptut and daily weights. Check renal parameters on IV lasix .  Repeat BMP today is pending.  Only about 1 lit of diuresis in the last 24 hours.  She currently denies any chest pain or sob.    Atrial fibrillation , permanent and rate controlled.  - on eliquis for anti coagulation and BB for rate controle.   H/o GI bleed.  S/p EGD and capsule study.  No obvious signs of bleeding this admission.    Temporal headache.  Vascular surgery consulted for biopsy of the temporal artery scheduled to 12/26,  to evalaute for temporal arteritis.  On prednisone.    Hypertension:  Well controlled.    Hyperlipidemia:  Resume statin.    Stage 3 CKD: Monitor creatinine while on lasix.    Diabetes  Mellitus:  insulin dependent.  CBG (last 3)  Recent Labs    04/11/18 1833 04/11/18 2230 04/12/18 0810  GLUCAP 383* 344* 237*   Not well controlled secondary to prednisone.  3 units of novolog TIDAC, Increased HUMULIN to 12 at night and 18 in am.  Changed to resistant SSI.    Right foot ulcer:  Chronic, will need podiatry follow up on discharge.  Wound care consulted and recommendations given.  X ray of the right foot shows Ulceration in the soft tissues adjacent to the fifth metatarsal phalangeal joint without evidence of osteomyelitis. Chronic needle-like foreign bodies adjacent to the first metatarsal phalangeal joint. Will start her on doxycycline for the chronic right foot ulcer.    DVT prophylaxis:Eliquis.  Code Status: DNR  Family Communication: none at bedside.  Disposition Plan: pending clinical improvement and temporal artery biopsy.    Consultants:   Vascular surgery.   Procedures: None.   Antimicrobials: Doxycycline.    Subjective: Her headache has resolved.  No chest pain or sob.   Objective: Vitals:   04/11/18 2138 04/12/18 0405 04/12/18 0410 04/12/18 0812  BP: 140/60 (!) 131/91  (!) 143/56  Pulse: 70 (!) 59  60  Resp: '18 18  20  '$ Temp: 98.3 F (36.8 C) 98.1 F (36.7 C)  (!) 97.3 F (36.3 C)  TempSrc: Oral Oral  Oral  SpO2: 95% 95%  94%  Weight:   81.5 kg  Height:        Intake/Output Summary (Last 24 hours) at 04/12/2018 1129 Last data filed at 04/12/2018 0907 Gross per 24 hour  Intake 880 ml  Output 650 ml  Net 230 ml   Filed Weights   04/10/18 2019 04/11/18 0457 04/12/18 0410  Weight: 82.1 kg 81.3 kg 81.5 kg    Examination:  General exam: Appears calm and comfortable not in any distress.  Respiratory system: Clear to auscultation. Respiratory effort normal. No wheezing or rhonchi.  Cardiovascular system: S1 & S2 heard, irregular.  Gastrointestinal system: Abdomen is nondistended, soft and nontender.bowel sounds good.  Central  nervous system: Alert and oriented to place and person. Able to move all extremities.  Extremities: right foot ulcer on the plantar aspet.  Skin: No rashes, lesions or ulcers Psychiatry: Mood & affect appropriate.     Data Reviewed: I have personally reviewed following labs and imaging studies  CBC: Recent Labs  Lab 04/10/18 1147 04/11/18 0713  WBC 8.2 8.5  NEUTROABS 6.4  --   HGB 8.8* 8.7*  HCT 29.4* 28.5*  MCV 86.7 85.8  PLT 296 568   Basic Metabolic Panel: Recent Labs  Lab 04/10/18 1147 04/11/18 0713  NA 134* 137  K 3.7 4.6  CL 107 106  CO2 18* 21*  GLUCOSE 193* 328*  BUN 27* 34*  CREATININE 1.67* 1.85*  CALCIUM 8.7* 8.8*   GFR: Estimated Creatinine Clearance: 26.1 mL/min (A) (by C-G formula based on SCr of 1.85 mg/dL (H)). Liver Function Tests: No results for input(s): AST, ALT, ALKPHOS, BILITOT, PROT, ALBUMIN in the last 168 hours. No results for input(s): LIPASE, AMYLASE in the last 168 hours. No results for input(s): AMMONIA in the last 168 hours. Coagulation Profile: No results for input(s): INR, PROTIME in the last 168 hours. Cardiac Enzymes: Recent Labs  Lab 04/10/18 1147  TROPONINI 0.03*   BNP (last 3 results) No results for input(s): PROBNP in the last 8760 hours. HbA1C: No results for input(s): HGBA1C in the last 72 hours. CBG: Recent Labs  Lab 04/11/18 1156 04/11/18 1606 04/11/18 1833 04/11/18 2230 04/12/18 0810  GLUCAP 282* 404* 383* 344* 237*   Lipid Profile: No results for input(s): CHOL, HDL, LDLCALC, TRIG, CHOLHDL, LDLDIRECT in the last 72 hours. Thyroid Function Tests: No results for input(s): TSH, T4TOTAL, FREET4, T3FREE, THYROIDAB in the last 72 hours. Anemia Panel: No results for input(s): VITAMINB12, FOLATE, FERRITIN, TIBC, IRON, RETICCTPCT in the last 72 hours. Sepsis Labs: No results for input(s): PROCALCITON, LATICACIDVEN in the last 168 hours.  No results found for this or any previous visit (from the past 240  hour(s)).       Radiology Studies: Dg Chest 2 View  Result Date: 04/10/2018 CLINICAL DATA:  Acute shortness of breath and chest pain EXAM: CHEST - 2 VIEW COMPARISON:  03/20/2018 FINDINGS: Artifact overlies the chest. Previous median sternotomy and CABG. Previous aortic valve replacement. Cardiomegaly. Pulmonary venous hypertension with interstitial edema. Small effusions in the posterior costophrenic angles. Mild dependent pulmonary atelectasis. IMPRESSION: Congestive heart failure with interstitial edema and small effusions. Electronically Signed   By: Nelson Chimes M.D.   On: 04/10/2018 12:29   Ct Head Wo Contrast  Result Date: 04/10/2018 CLINICAL DATA:  Severe headache. EXAM: CT HEAD WITHOUT CONTRAST TECHNIQUE: Contiguous axial images were obtained from the base of the skull through the vertex without intravenous contrast. COMPARISON:  CT head dated December 11, 2017. FINDINGS: Brain: No evidence of acute infarction, hemorrhage, hydrocephalus, extra-axial collection or  mass lesion/mass effect. Unchanged left frontal encephalomalacia. Stable mild atrophy and chronic microvascular ischemic changes. Vascular: Calcified atherosclerosis at the skullbase. No hyperdense vessel. Skull: Negative for fracture or focal abnormality. Sinuses/Orbits: No acute finding. Unchanged partial opacification of the left mastoid air cells. Other: None. IMPRESSION: 1.  No acute intracranial abnormality. 2. Stable mild atrophy and chronic microvascular ischemic changes. Unchanged old left frontal infarct. Electronically Signed   By: Titus Dubin M.D.   On: 04/10/2018 14:59   Dg Foot Complete Right  Result Date: 04/11/2018 CLINICAL DATA:  Diabetic ulcer RIGHT foot EXAM: RIGHT FOOT COMPLETE - 3+ VIEW COMPARISON:  03/13/2018 FINDINGS: Soft tissue ulceration in the lateral aspect of the forefoot adjacent to the fifth metatarsal phalangeal joint. No evidence of osseous erosion to suggest osteomyelitis. Two needle tips are  in the soft tissues adjacent to the first metatarsal phalangeal joint. No change from prior. IMPRESSION: 1. Ulceration in the soft tissues adjacent to the fifth metatarsal phalangeal joint. 2. No evidence of osteomyelitis. 3. Chronic needle-like foreign bodies adjacent to the first metatarsal phalangeal joint. Electronically Signed   By: Suzy Bouchard M.D.   On: 04/11/2018 16:45        Scheduled Meds: . amLODipine  10 mg Oral Daily  . [START ON 04/13/2018] apixaban  2.5 mg Oral BID  . atorvastatin  80 mg Oral QHS  . furosemide  40 mg Intravenous Q12H  . insulin aspart  0-20 Units Subcutaneous TID WC  . insulin aspart  0-5 Units Subcutaneous QHS  . insulin aspart  3 Units Subcutaneous TID WC  . insulin NPH Human  12 Units Subcutaneous QAC supper  . [START ON 04/13/2018] insulin NPH Human  18 Units Subcutaneous QAC breakfast  . metoprolol tartrate  50 mg Oral BID  . multivitamin with minerals  1 tablet Oral Daily  . mupirocin ointment   Nasal Daily  . nutrition supplement (JUVEN)  1 packet Oral BID BM  . pantoprazole  40 mg Oral Daily  . predniSONE  60 mg Oral Q breakfast   Continuous Infusions: . [START ON 04/13/2018]  ceFAZolin (ANCEF) IV       LOS: 2 days    Time spent: 28 minutes.     Hosie Poisson, MD [facility] Pager [phone] If 7PM-7AM, please contact night-coverage [email or web address] Password [facility] 04/12/2018, 11:29 AM

## 2018-04-13 ENCOUNTER — Inpatient Hospital Stay (HOSPITAL_COMMUNITY): Payer: Medicare Other | Admitting: Certified Registered Nurse Anesthetist

## 2018-04-13 ENCOUNTER — Encounter (HOSPITAL_COMMUNITY)
Admission: EM | Disposition: A | Discharge status: Home / Self Care | Payer: Self-pay | Source: Home / Self Care | Attending: Internal Medicine

## 2018-04-13 HISTORY — PX: ARTERY BIOPSY: SHX891

## 2018-04-13 LAB — BASIC METABOLIC PANEL
Anion gap: 7 (ref 5–15)
BUN: 68 mg/dL — ABNORMAL HIGH (ref 8–23)
CO2: 23 mmol/L (ref 22–32)
Calcium: 9 mg/dL (ref 8.9–10.3)
Chloride: 103 mmol/L (ref 98–111)
Creatinine, Ser: 2.03 mg/dL — ABNORMAL HIGH (ref 0.44–1.00)
GFR calc Af Amer: 26 mL/min — ABNORMAL LOW (ref >60)
GFR calc non Af Amer: 23 mL/min — ABNORMAL LOW (ref >60)
GLUCOSE: 320 mg/dL — AB (ref 70–99)
Potassium: 4.9 mmol/L (ref 3.5–5.1)
Sodium: 133 mmol/L — ABNORMAL LOW (ref 135–145)

## 2018-04-13 LAB — SURGICAL PCR SCREEN
MRSA, PCR: POSITIVE — AB
Staphylococcus aureus: POSITIVE — AB

## 2018-04-13 LAB — GLUCOSE, CAPILLARY
GLUCOSE-CAPILLARY: 202 mg/dL — AB (ref 70–99)
Glucose-Capillary: 299 mg/dL — ABNORMAL HIGH (ref 70–99)
Glucose-Capillary: 202 mg/dL — ABNORMAL HIGH (ref 70–99)
Glucose-Capillary: 281 mg/dL — ABNORMAL HIGH (ref 70–99)

## 2018-04-13 SURGERY — BIOPSY TEMPORAL ARTERY
Anesthesia: Monitor Anesthesia Care | Site: Head | Laterality: Right

## 2018-04-13 MED ORDER — PROPOFOL 500 MG/50ML IV EMUL
INTRAVENOUS | Status: DC | PRN
  Administered 2018-04-13: 50 ug/kg/min via INTRAVENOUS

## 2018-04-13 MED ORDER — INSULIN ASPART 100 UNIT/ML ~~LOC~~ SOLN
5.0000 [IU] | Freq: Three times a day (TID) | SUBCUTANEOUS | Status: DC
  Administered 2018-04-13 – 2018-04-14 (×4): 5 [IU] via SUBCUTANEOUS

## 2018-04-13 MED ORDER — PROPOFOL 10 MG/ML IV BOLUS
INTRAVENOUS | Status: AC
  Filled 2018-04-13: qty 20

## 2018-04-13 MED ORDER — ONDANSETRON HCL 4 MG/2ML IJ SOLN
INTRAMUSCULAR | Status: DC | PRN
  Administered 2018-04-13: 4 mg via INTRAVENOUS

## 2018-04-13 MED ORDER — FENTANYL CITRATE (PF) 100 MCG/2ML IJ SOLN: 25.0000 ug | INTRAMUSCULAR | Status: DC | PRN

## 2018-04-13 MED ORDER — LACTATED RINGERS IV SOLN
INTRAVENOUS | Status: DC | PRN
  Administered 2018-04-13: 12:00:00 via INTRAVENOUS

## 2018-04-13 MED ORDER — LIDOCAINE-EPINEPHRINE 0.5 %-1:200000 IJ SOLN
INTRAMUSCULAR | Status: AC
  Filled 2018-04-13: qty 1

## 2018-04-13 MED ORDER — 0.9 % SODIUM CHLORIDE (POUR BTL) OPTIME
TOPICAL | Status: DC | PRN
  Administered 2018-04-13: 1000 mL

## 2018-04-13 MED ORDER — FENTANYL CITRATE (PF) 250 MCG/5ML IJ SOLN
INTRAMUSCULAR | Status: AC
  Filled 2018-04-13: qty 5

## 2018-04-13 MED ORDER — LIDOCAINE-EPINEPHRINE 0.5 %-1:200000 IJ SOLN
INTRAMUSCULAR | Status: DC | PRN
  Administered 2018-04-13: 50 mL

## 2018-04-13 MED ORDER — MEPERIDINE HCL 50 MG/ML IJ SOLN: 6.2500 mg | INTRAMUSCULAR | Status: DC | PRN

## 2018-04-13 MED ORDER — FENTANYL CITRATE (PF) 250 MCG/5ML IJ SOLN
INTRAMUSCULAR | Status: DC | PRN
  Administered 2018-04-13: 25 ug via INTRAVENOUS

## 2018-04-13 MED ORDER — LIDOCAINE HCL (CARDIAC) PF 100 MG/5ML IV SOSY
PREFILLED_SYRINGE | INTRAVENOUS | Status: DC | PRN
  Administered 2018-04-13: 60 mg via INTRAVENOUS

## 2018-04-13 SURGICAL SUPPLY — 41 items
ADH SKN CLS APL DERMABOND .7 (GAUZE/BANDAGES/DRESSINGS) ×1
ADH SKN CLS LQ APL DERMABOND (GAUZE/BANDAGES/DRESSINGS) ×1
BALL CTTN LRG ABS STRL LF (GAUZE/BANDAGES/DRESSINGS) ×1
CANISTER SUCT 3000ML PPV (MISCELLANEOUS) ×3 IMPLANT
CLIP LIGATING EXTRA SM BLUE (MISCELLANEOUS) ×2 IMPLANT
CONT SPEC 4OZ CLIKSEAL STRL BL (MISCELLANEOUS) ×3 IMPLANT
COTTONBALL LRG STERILE PKG (GAUZE/BANDAGES/DRESSINGS) ×3 IMPLANT
COVER SURGICAL LIGHT HANDLE (MISCELLANEOUS) ×3 IMPLANT
COVER WAND RF STERILE (DRAPES) ×3 IMPLANT
DECANTER SPIKE VIAL GLASS SM (MISCELLANEOUS) ×3 IMPLANT
DERMABOND ADHESIVE PROPEN (GAUZE/BANDAGES/DRESSINGS) ×2
DERMABOND ADVANCED (GAUZE/BANDAGES/DRESSINGS) ×2
DERMABOND ADVANCED .7 DNX12 (GAUZE/BANDAGES/DRESSINGS) ×1 IMPLANT
DERMABOND ADVANCED .7 DNX6 (GAUZE/BANDAGES/DRESSINGS) IMPLANT
DRAPE BRACHIAL (DRAPES) ×2 IMPLANT
DRAPE HALF SHEET 40X57 (DRAPES) ×2 IMPLANT
DRAPE LAPAROTOMY T 102X78X121 (DRAPES) ×3 IMPLANT
ELECT REM PT RETURN 9FT ADLT (ELECTROSURGICAL) ×3
ELECTRODE REM PT RTRN 9FT ADLT (ELECTROSURGICAL) ×1 IMPLANT
GLOVE SS BIOGEL STRL SZ 7.5 (GLOVE) ×1 IMPLANT
GLOVE SUPERSENSE BIOGEL SZ 7.5 (GLOVE) ×2
GOWN STRL REUS W/ TWL LRG LVL3 (GOWN DISPOSABLE) ×3 IMPLANT
GOWN STRL REUS W/TWL LRG LVL3 (GOWN DISPOSABLE) ×9
KIT BASIN OR (CUSTOM PROCEDURE TRAY) ×3 IMPLANT
KIT TURNOVER KIT B (KITS) ×3 IMPLANT
LOOP VESSEL MAXI BLUE (MISCELLANEOUS) ×3 IMPLANT
NDL HYPO 25GX1X1/2 BEV (NEEDLE) ×1 IMPLANT
NEEDLE HYPO 25GX1X1/2 BEV (NEEDLE) ×3 IMPLANT
NS IRRIG 1000ML POUR BTL (IV SOLUTION) ×3 IMPLANT
PACK GENERAL/GYN (CUSTOM PROCEDURE TRAY) ×3 IMPLANT
PAD ARMBOARD 7.5X6 YLW CONV (MISCELLANEOUS) ×6 IMPLANT
SPONGE LAP 4X18 RFD (DISPOSABLE) ×3 IMPLANT
SUT SILK 3 0 (SUTURE) ×3
SUT SILK 3-0 18XBRD TIE 12 (SUTURE) ×1 IMPLANT
SUT SILK 4 0 TIES 17X18 (SUTURE) ×3 IMPLANT
SUT VIC AB 3-0 SH 27 (SUTURE) ×3
SUT VIC AB 3-0 SH 27X BRD (SUTURE) ×1 IMPLANT
SUT VICRYL 4-0 PS2 18IN ABS (SUTURE) ×3 IMPLANT
SYR CONTROL 10ML LL (SYRINGE) ×3 IMPLANT
TOWEL GREEN STERILE (TOWEL DISPOSABLE) ×3 IMPLANT
WATER STERILE IRR 1000ML POUR (IV SOLUTION) IMPLANT

## 2018-04-13 NOTE — Anesthesia Postprocedure Evaluation (Signed)
Anesthesia Post Note  Patient: Cynde Menard  Procedure(s) Performed: BIOPSY TEMPORAL ARTERY RIGHT (Right Head)     Patient location during evaluation: PACU Anesthesia Type: MAC Level of consciousness: awake and alert Pain management: pain level controlled Vital Signs Assessment: post-procedure vital signs reviewed and stable Respiratory status: spontaneous breathing, nonlabored ventilation, respiratory function stable and patient connected to nasal cannula oxygen Cardiovascular status: stable and blood pressure returned to baseline Postop Assessment: no apparent nausea or vomiting Anesthetic complications: no    Last Vitals:  Vitals:   04/13/18 1325 04/13/18 1338  BP: (!) 140/52 (!) 120/50  Pulse: (!) 57 (!) 58  Resp: 15 18  Temp:  36.4 C  SpO2: 92% 94%    Last Pain:  Vitals:   04/13/18 1338  TempSrc:   PainSc: 0-No pain                 Valina Maes

## 2018-04-13 NOTE — Transfer of Care (Signed)
Immediate Anesthesia Transfer of Care Note  Patient: Kristina Summers  Procedure(s) Performed: BIOPSY TEMPORAL ARTERY RIGHT (Right Head)  Patient Location: PACU  Anesthesia Type:MAC  Level of Consciousness: awake, patient cooperative and responds to stimulation  Airway & Oxygen Therapy: Patient Spontanous Breathing and Patient connected to face mask oxygen  Post-op Assessment: Report given to RN and Post -op Vital signs reviewed and stable  Post vital signs: Reviewed and stable  Last Vitals:  Vitals Value Taken Time  BP 135/47 04/13/2018  1:10 PM  Temp    Pulse 54 04/13/2018  1:10 PM  Resp 20 04/13/2018  1:10 PM  SpO2 100 % 04/13/2018  1:10 PM  Vitals shown include unvalidated device data.  Last Pain:  Vitals:   04/13/18 0610  TempSrc: Oral  PainSc:          Complications: No apparent anesthesia complications

## 2018-04-13 NOTE — Anesthesia Preprocedure Evaluation (Signed)
Anesthesia Evaluation  Patient identified by MRN, date of birth, ID band Patient awake    Reviewed: Allergy & Precautions, NPO status , Patient's Chart, lab work & pertinent test results  Airway Mallampati: II  TM Distance: >3 FB     Dental   Pulmonary former smoker,    breath sounds clear to auscultation       Cardiovascular hypertension, + CAD, + Peripheral Vascular Disease and +CHF   Rhythm:Regular Rate:Normal  ECHO 3/18 Study Conclusions  - Left ventricle: Normal LV size and systolic function, EF 28%.   There did appear to be septal bounce. - Aortic valve: Bioprosthetic aortic valve s/p TAVR with mild   perivalvular aortic insufficiency. No stenosis with mean gradient   7 mmHg. There was no vegetation on the aortic valve. - Aorta: Normal caliber thoracic aorta with grade III plaque. - Mitral valve: No evidence of vegetation. There was mild to   moderate regurgitation. - Left atrium: The atrium was moderately dilated. Smoke but no   discrete thrombus in the LA appendage. - Right ventricle: The cavity size was normal. Systolic function   was normal. - Right atrium: The atrium was mildly dilated. - Atrial septum: Small PFO seen by color doppler. - Tricuspid valve: No evidence of vegetation. - Pulmonic valve: No evidence of vegetation.   Neuro/Psych    GI/Hepatic GERD  ,  Endo/Other  diabetes  Renal/GU Renal disease     Musculoskeletal   Abdominal   Peds  Hematology  (+) Blood dyscrasia, anemia ,   Anesthesia Other Findings   Reproductive/Obstetrics                             Anesthesia Physical  Anesthesia Plan  ASA: III  Anesthesia Plan: MAC   Post-op Pain Management:    Induction: Intravenous  PONV Risk Score and Plan: 2 and Treatment may vary due to age or medical condition and Ondansetron  Airway Management Planned: Simple Face Mask and Nasal Cannula  Additional  Equipment:   Intra-op Plan:   Post-operative Plan:   Informed Consent: I have reviewed the patients History and Physical, chart, labs and discussed the procedure including the risks, benefits and alternatives for the proposed anesthesia with the patient or authorized representative who has indicated his/her understanding and acceptance.   Dental advisory given  Plan Discussed with: Anesthesiologist, CRNA and Surgeon  Anesthesia Plan Comments:         Anesthesia Quick Evaluation

## 2018-04-13 NOTE — Op Note (Signed)
    OPERATIVE REPORT  DATE OF SURGERY: 04/13/2018  PATIENT: Kristina Summers, 82 y.o. female MRN: [id number]  DOB: [birth year]  PRE-OPERATIVE DIAGNOSIS: Right temporal headache with possible temporal arteritis  POST-OPERATIVE DIAGNOSIS:  Same  PROCEDURE: Right temporal artery biopsy  SURGEON:  Curt Jews, M.D.  PHYSICIAN ASSISTANT: Nurse  ANESTHESIA: Local with sedation  EBL: per anesthesia record  Total I/O In: 400 [I.V.:400] Out: 0   BLOOD ADMINISTERED: none  DRAINS: none  SPECIMEN: none  COUNTS CORRECT:  YES  PATIENT DISPOSITION:  PACU - hemodynamically stable  PROCEDURE DETAILS: Patient was taken the operating placed supine position where the area of the right preauricular area was prepped and draped in usual sterile fashion.  Using local anesthesia incision was made anterior to the ear and carried down to the level of the temporal artery.  Patient had 1 large branch arising from the temporal artery and this was ligated and divided.  The artery was ligated proximally with 3-0 silk suture and was ligated distally with 3-0 silk suture.  Proximally a 2 cm segment of temporal artery was harvested and sent for permanent pathology.  The wound was irrigated with saline.  Hemostasis obtained with cautery.  The wound was closed with 2-0 Vicryl to reapproximate subcutaneous tissue.  Skin was closed with a 4-0 subcuticular Vicryl suture   Rosetta Posner, M.D., [facility] 04/13/2018 1:11 PM

## 2018-04-13 NOTE — Plan of Care (Signed)
  Problem: Spiritual Needs Goal: Ability to function at adequate level Outcome: Progressing   Problem: Education: Goal: Ability to demonstrate management of disease process will improve Outcome: Progressing Goal: Ability to verbalize understanding of medication therapies will improve Outcome: Progressing   Problem: Education: Goal: Individualized Educational Video(s) Outcome: Not Met (add Reason)   Problem: Activity: Goal: Capacity to carry out activities will improve Outcome: Not Met (add Reason)   Problem: Cardiac: Goal: Ability to achieve and maintain adequate cardiopulmonary perfusion will improve Outcome: Not Met (add Reason)

## 2018-04-13 NOTE — Progress Notes (Signed)
PT Cancellation Note  Patient Details Name: Bernece Gall MRN: [id number] DOB: 18-Feb-1936   Cancelled Treatment:    Reason Eval/Treat Not Completed: Medical issues which prohibited therapy(Pt leaving floor for the OR as PT arrived.  Will see tomorrow.)   Denice Paradise 04/13/2018, 11:23 AM  Amanda [facility]ayne Acute Rehabilitation Services Pager:  [phone]  Office:  [phone]

## 2018-04-13 NOTE — Interval H&P Note (Signed)
History and Physical Interval Note:  04/13/2018 11:56 AM  Kristina Summers  has presented today for surgery, with the diagnosis of TEMPORAL HEADACHES  The various methods of treatment have been discussed with the patient and family. After consideration of risks, benefits and other options for treatment, the patient has consented to  Procedure(s): BIOPSY TEMPORAL ARTERY RIGHT (Right) as a surgical intervention .  The patient's history has been reviewed, patient examined, no change in status, stable for surgery.  I have reviewed the patient's chart and labs.  Questions were answered to the patient's satisfaction.     Curt Jews

## 2018-04-13 NOTE — Progress Notes (Signed)
PROGRESS NOTE    Kristina Summers  BBC:[id number] DOB: Feb 19, 1936 DOA: 04/10/2018 PCP: [facility]    Brief Narrative:   Kristina Summers is a 82 y.o. female with history h/o DM-2,diastolic CHF, CKD 4, CAD status post CABG in 2000,AS status post TVAR in 10/2017,PVD with multiple stents,A. fib on Eliquis, CVA with speech abnormalityandGI bleed who [facility] 50/3/88 for diastolic CHF exacerbation and discharged with increased dose of Lasix presents now with complaints of headache and exertional dyspnea.     Work-up in the ED revealed elevated ESR at 127 (although was 136 in November), BNP at 2800 (was 1200 previously) chest x-ray shows pulmonary edema.  She is saturating well on room air and her blood pressure is elevated at systolic 828.  Patient also noted to have foul-smelling wound on the right foot.  She states she has not been able to afford seeing a podiatrist and has home wound care nurse visit her twice a week for wound care dressings.  She denies any recent pustular drainage or bleeding.    Assessment & Plan:   Active Problems:   Paroxysmal atrial fibrillation   Diabetes mellitus type 2 with peripheral artery disease (HCC)   Hyperlipidemia   S/P CABG (coronary artery bypass graft)   History of GI bleed   Essential hypertension   Type II diabetes mellitus with renal manifestations (HCC)   S/P TAVR (transcatheter aortic valve replacement)   CKD (chronic kidney disease), stage III (HCC)   Foot ulcer, right (HCC)   CHF exacerbation (HCC)   Acute on chronic diastolic CHF: Started her on IV lasix 40 mg BID.  Strict intake and outptut and daily weights. Diuresed about 2100 ml last nght.  Repeat BMP dhows slight worsening of creatinine, we will hold the lasix today and repeat creatinine is am, to make sure it is improving. She currently denies any chest pain or sob.    Atrial fibrillation , permanent and rate controlled.  - on eliquis for  anti coagulation and BB for rate control.  H/o GI bleed.  S/p EGD and capsule study.  No obvious signs of bleeding this admission.    Temporal headache.  Vascular surgery consulted for biopsy of the temporal artery, underwent on 12/26,  to evalaute for temporal arteritis.  On prednisone 60 mg daily. She reports her headache has resolved.    Hypertension:  Well controlled.    Hyperlipidemia:  Resume statin.    Stage 3 CKD: Monitor creatinine while on lasix.    Diabetes Mellitus:  insulin dependent.  CBG (last 3)  Recent Labs    04/13/18 0742 04/13/18 1130 04/13/18 1311  GLUCAP 299* 202* 202*   Not well controlled secondary to prednisone.  increase to 5  units of novolog TIDAC, Increased HUMULIN to 12 at night and 18 in am.  Changed to resistant SSI.    Right foot ulcer:  Chronic, will need podiatry follow up on discharge.  Wound care consulted and recommendations given.  X ray of the right foot shows Ulceration in the soft tissues adjacent to the fifth metatarsal phalangeal joint without evidence of osteomyelitis. Chronic needle-like foreign bodies adjacent to the first metatarsal phalangeal joint. Complete a course of doxycycline for the right foot ulcer.    DVT prophylaxis:Eliquis.  Code Status: DNR  Family Communication: none at bedside.  Disposition Plan: pending clinical improvement and temporal artery biopsy.    Consultants:   Vascular surgery.   Procedures: None.  Antimicrobials: Doxycycline.    Subjective: She appears tired.  No nausea or vomiting.   Objective: Vitals:   04/13/18 0610 04/13/18 1310 04/13/18 1325 04/13/18 1338  BP: (!) 154/64 (!) 135/47 (!) 140/52 (!) 120/50  Pulse: 70 (!) 59 (!) 57 (!) 58  Resp: 16 (!) 34 15 18  Temp: 98.2 F (36.8 C) (!) 97.3 F (36.3 C)  97.6 F (36.4 C)  TempSrc: Oral     SpO2: 96% 100% 92% 94%  Weight:      Height:        Intake/Output Summary (Last 24 hours) at 04/13/2018 1615 Last data  filed at 04/13/2018 1253 Gross per 24 hour  Intake 580 ml  Output 1300 ml  Net -720 ml   Filed Weights   04/11/18 0457 04/12/18 0410 04/13/18 0608  Weight: 81.3 kg 81.5 kg 81.6 kg    Examination:  General exam: not in distress.  Respiratory system: air entry diminished at bases, no wheezing or rhonchi.  Cardiovascular system: S1 & S2 heard, irregular.  Gastrointestinal system: Abdomen is soft nt nd bs+ Central nervous system: Alert and oriented to place and person. Able to move all extremities.  Extremities: right foot ulcer on the plantar aspet.  Skin: No rashes, lesions or ulcers Psychiatry: Mood & affect appropriate.     Data Reviewed: I have personally reviewed following labs and imaging studies  CBC: Recent Labs  Lab 04/10/18 1147 04/11/18 0713 04/12/18 1448  WBC 8.2 8.5 12.1*  NEUTROABS 6.4  --   --   HGB 8.8* 8.7* 8.8*  HCT 29.4* 28.5* 28.4*  MCV 86.7 85.8 86.1  PLT 296 255 086   Basic Metabolic Panel: Recent Labs  Lab 04/10/18 1147 04/11/18 0713 04/12/18 1448 04/13/18 0919  NA 134* 137 131* 133*  K 3.7 4.6 4.2 4.9  CL 107 106 101 103  CO2 18* 21* 23 23  GLUCOSE 193* 328* 237* 320*  BUN 27* 34* 59* 68*  CREATININE 1.67* 1.85* 1.97* 2.03*  CALCIUM 8.7* 8.8* 8.7* 9.0   GFR: Estimated Creatinine Clearance: 23.8 mL/min (A) (by C-G formula based on SCr of 2.03 mg/dL (H)). Liver Function Tests: No results for input(s): AST, ALT, ALKPHOS, BILITOT, PROT, ALBUMIN in the last 168 hours. No results for input(s): LIPASE, AMYLASE in the last 168 hours. No results for input(s): AMMONIA in the last 168 hours. Coagulation Profile: No results for input(s): INR, PROTIME in the last 168 hours. Cardiac Enzymes: Recent Labs  Lab 04/10/18 1147  TROPONINI 0.03*   BNP (last 3 results) No results for input(s): PROBNP in the last 8760 hours. HbA1C: No results for input(s): HGBA1C in the last 72 hours. CBG: Recent Labs  Lab 04/12/18 2104 04/12/18 2352  04/13/18 0742 04/13/18 1130 04/13/18 1311  GLUCAP 344* 313* 299* 202* 202*   Lipid Profile: No results for input(s): CHOL, HDL, LDLCALC, TRIG, CHOLHDL, LDLDIRECT in the last 72 hours. Thyroid Function Tests: No results for input(s): TSH, T4TOTAL, FREET4, T3FREE, THYROIDAB in the last 72 hours. Anemia Panel: No results for input(s): VITAMINB12, FOLATE, FERRITIN, TIBC, IRON, RETICCTPCT in the last 72 hours. Sepsis Labs: No results for input(s): PROCALCITON, LATICACIDVEN in the last 168 hours.  Recent Results (from the past 240 hour(s))  Surgical pcr screen     Status: Abnormal   Collection Time: 04/13/18 12:34 AM  Result Value Ref Range Status   MRSA, PCR POSITIVE (A) NEGATIVE Final    Comment: RESULT CALLED TO, READ BACK BY AND VERIFIED WITH: H  [facility] RN 04/13/18 0318 JDW    Staphylococcus aureus POSITIVE (A) NEGATIVE Final    Comment: (NOTE) The Xpert SA Assay (FDA approved for NASAL specimens in patients 61 years of age and older), is one component of a comprehensive surveillance program. It is not intended to diagnose infection nor to guide or monitor treatment. Performed at [facility], [place] [address], [place], [place] [address]          Radiology Studies: No results found.      Scheduled Meds: . amLODipine  10 mg Oral Daily  . apixaban  2.5 mg Oral BID  . atorvastatin  80 mg Oral QHS  . doxycycline  100 mg Oral Q12H  . insulin aspart  0-20 Units Subcutaneous TID WC  . insulin aspart  0-5 Units Subcutaneous QHS  . insulin aspart  3 Units Subcutaneous TID WC  . insulin NPH Human  12 Units Subcutaneous QAC supper  . insulin NPH Human  18 Units Subcutaneous QAC breakfast  . metoprolol tartrate  50 mg Oral BID  . multivitamin with minerals  1 tablet Oral Daily  . mupirocin ointment   Nasal Daily  . nutrition supplement (JUVEN)  1 packet Oral BID BM  . pantoprazole  40 mg Oral Daily  . predniSONE  60 mg Oral Q breakfast   Continuous Infusions:     LOS: 3 days    Time spent: 26 minutes.     Hosie Poisson, MD [facility] Pager [phone] If 7PM-7AM, please contact night-coverage [email or web address] Password [facility] 04/13/2018, 4:15 PM

## 2018-04-13 NOTE — Progress Notes (Signed)
OT Cancellation Note  Patient Details Name: Kristina Summers MRN: [id number] DOB: [birth year]   Cancelled Treatment:    Reason Eval/Treat Not Completed: Patient at procedure or test/ unavailable(Pt in surgery.) Will follow.  Malka So 04/13/2018, 11:42 AM  Nestor Lewandowsky, OTR/L Acute Rehabilitation Services Pager: [phone] Office: [phone]

## 2018-04-13 NOTE — Progress Notes (Signed)
Nutrition Follow-up  DOCUMENTATION CODES:   Not applicable  INTERVENTION:   -Once diet is advanced, resume:  --1 packet Juven BID, each packet provides 80 calories, 8 grams of carbohydrate, and 14 grams of amino acids; supplement contains CaHMB, glutamine, and arginine, to promote wound healing -MVI with minerals daily  NUTRITION DIAGNOSIS:   Increased nutrient needs related to wound healing as evidenced by estimated needs.  Ongoing  GOAL:   Patient will meet greater than or equal to 90% of their needs  Progressing  MONITOR:   PO intake, Supplement acceptance, Labs, Weight trends, Skin, I & O's  REASON FOR ASSESSMENT:   Malnutrition Screening Tool    ASSESSMENT:   Kristina Summers is a 82 y.o. female with history h/o  DM-2, diastolic CHF, CKD 4, CAD status post CABG in 2000, AS status post TVAR in 10/2017, PVD with multiple stents, A. fib on Eliquis, CVA with speech abnormality and GI bleed who [facility] to 51/8/84 for diastolic CHF exacerbation and discharged with increased dose of Lasix presents now with complaints of headache and exertional dyspnea.    12/26- s/p PROCEDURE: Right temporal artery biopsy  Reviewed I/O's: -1.7 L x 24 hours and -1.9 L since admission  Pt out of room at time of visit.   Pt with good appetite; noted meal completion 80-100%. Pt also taking Juven and MVI supplements per MAR.   Medications reviewed and include prednisone.  Labs reviewed: Na: 133, CBGS: 202-299 (inpatient orders for glycemic control are 0-20 units insulin aspart TID with meals 0-5 units insulin aspart q HS, 3 units insulin aspart TID with meals, 12 units insulin NPH before supper and 18 units insulin NPH before breakfast).   Diet Order:   Diet Order            Diet NPO time specified  Diet effective midnight              EDUCATION NEEDS:   No education needs have been identified at this time  Skin:  Skin Assessment: Skin Integrity  Issues: Skin Integrity Issues:: Diabetic Ulcer Diabetic Ulcer: rt lateral foot, extending to plantar surface  Last BM:  04/12/18  Height:   Ht Readings from Last 1 Encounters:  04/10/18 5\' 6"  (1.676 m)    Weight:   Wt Readings from Last 1 Encounters:  04/13/18 81.6 kg    Ideal Body Weight:  59.1 kg  BMI:  Body mass index is 29.04 kg/m.  Estimated Nutritional Needs:   Kcal:  1600-1800  Protein:  80-95 grams  Fluid:  >1.6 L    Ramani Riva A. Jimmye Norman, RD, LDN, CDE Pager: [phone] After hours Pager: [phone]

## 2018-04-13 NOTE — Anesthesia Procedure Notes (Signed)
Procedure Name: MAC Date/Time: 04/13/2018 12:13 PM Performed by: Glynda Jaeger, CRNA Pre-anesthesia Checklist: Patient identified, Emergency Drugs available, Suction available, Timeout performed and Patient being monitored Patient Re-evaluated:Patient Re-evaluated prior to induction Oxygen Delivery Method: Simple face mask Placement Confirmation: positive ETCO2

## 2018-04-13 NOTE — Plan of Care (Signed)
  Problem: Spiritual Needs Goal: Ability to function at adequate level Outcome: Progressing   Problem: Education: Goal: Ability to demonstrate management of disease process will improve Outcome: Progressing Goal: Ability to verbalize understanding of medication therapies will improve Outcome: Progressing Goal: Individualized Educational Video(s) Outcome: Progressing   Problem: Activity: Goal: Capacity to carry out activities will improve Outcome: Progressing   Problem: Cardiac: Goal: Ability to achieve and maintain adequate cardiopulmonary perfusion will improve Outcome: Progressing

## 2018-04-14 ENCOUNTER — Ambulatory Visit (HOSPITAL_COMMUNITY): Payer: Medicare Other

## 2018-04-14 ENCOUNTER — Encounter (HOSPITAL_COMMUNITY): Payer: Self-pay | Admitting: Vascular Surgery

## 2018-04-14 DIAGNOSIS — I480 Paroxysmal atrial fibrillation: Secondary | ICD-10-CM

## 2018-04-14 LAB — C-REACTIVE PROTEIN: CRP: 1.9 mg/dL — ABNORMAL HIGH (ref <1.0)

## 2018-04-14 LAB — CBC WITH DIFFERENTIAL/PLATELET
Abs Immature Granulocytes: 0 10*3/uL (ref 0.00–0.07)
Basophils Absolute: 0 10*3/uL (ref 0.0–0.1)
Basophils Relative: 0 %
Eosinophils Absolute: 0.1 10*3/uL (ref 0.0–0.5)
Eosinophils Relative: 1 %
HEMATOCRIT: 30.5 % — AB (ref 36.0–46.0)
HEMOGLOBIN: 9.2 g/dL — AB (ref 12.0–15.0)
LYMPHS PCT: 3 %
Lymphs Abs: 0.3 10*3/uL — ABNORMAL LOW (ref 0.7–4.0)
MCH: 26.4 pg (ref 26.0–34.0)
MCHC: 30.2 g/dL (ref 30.0–36.0)
MCV: 87.6 fL (ref 80.0–100.0)
Monocytes Absolute: 0.1 10*3/uL (ref 0.1–1.0)
Monocytes Relative: 1 %
Neutro Abs: 9.6 10*3/uL — ABNORMAL HIGH (ref 1.7–7.7)
Neutrophils Relative %: 95 %
Platelets: 230 10*3/uL (ref 150–400)
RBC: 3.48 MIL/uL — ABNORMAL LOW (ref 3.87–5.11)
RDW: 19.9 % — ABNORMAL HIGH (ref 11.5–15.5)
WBC: 10.1 10*3/uL (ref 4.0–10.5)
nRBC: 0 % (ref 0.0–0.2)
nRBC: 0 /100 WBC

## 2018-04-14 LAB — GLUCOSE, CAPILLARY
GLUCOSE-CAPILLARY: 261 mg/dL — AB (ref 70–99)
Glucose-Capillary: 396 mg/dL — ABNORMAL HIGH (ref 70–99)
Glucose-Capillary: 236 mg/dL — ABNORMAL HIGH (ref 70–99)
Glucose-Capillary: 232 mg/dL — ABNORMAL HIGH (ref 70–99)
Glucose-Capillary: 205 mg/dL — ABNORMAL HIGH (ref 70–99)

## 2018-04-14 LAB — BASIC METABOLIC PANEL
Anion gap: 7 (ref 5–15)
BUN: 71 mg/dL — ABNORMAL HIGH (ref 8–23)
CO2: 22 mmol/L (ref 22–32)
Calcium: 8.9 mg/dL (ref 8.9–10.3)
Chloride: 105 mmol/L (ref 98–111)
Creatinine, Ser: 1.91 mg/dL — ABNORMAL HIGH (ref 0.44–1.00)
GFR calc Af Amer: 28 mL/min — ABNORMAL LOW (ref >60)
GFR calc non Af Amer: 24 mL/min — ABNORMAL LOW (ref >60)
Glucose, Bld: 256 mg/dL — ABNORMAL HIGH (ref 70–99)
Potassium: 4.3 mmol/L (ref 3.5–5.1)
Sodium: 134 mmol/L — ABNORMAL LOW (ref 135–145)

## 2018-04-14 MED ORDER — INSULIN NPH (HUMAN) (ISOPHANE) 100 UNIT/ML ~~LOC~~ SUSP
18.0000 [IU] | Freq: Every day | SUBCUTANEOUS | Status: DC
  Administered 2018-04-14 – 2018-04-15 (×2): 18 [IU] via SUBCUTANEOUS
  Filled 2018-04-14: qty 10

## 2018-04-14 MED ORDER — INSULIN ASPART 100 UNIT/ML ~~LOC~~ SOLN
0.0000 [IU] | Freq: Three times a day (TID) | SUBCUTANEOUS | Status: DC
  Administered 2018-04-14: 7 [IU] via SUBCUTANEOUS
  Administered 2018-04-14: 11 [IU] via SUBCUTANEOUS
  Administered 2018-04-14 – 2018-04-15 (×2): 7 [IU] via SUBCUTANEOUS

## 2018-04-14 NOTE — Evaluation (Addendum)
Physical Therapy Evaluation Patient Details Name: Kristina Summers MRN: [id number] DOB: 03-Aug-1935 Today's Date: 04/14/2018   History of Present Illness  pt presents with c/o headache and exertional dyspnea with  h/o  DM-2, diastolic CHF, CKD 4, CAD status post CABG in 2000, AS status post TVAR in 10/2017, PVD with multiple stents, A. fib on Eliquis, CVA with speech abnormality and GI bleed who [facility]66 for diastolic CHF exacerbation and discharged with increased dose of Lasix     Clinical Impression  Pt admitted with above diagnosis. Pt currently with functional limitations due to the deficits listed below (see PT Problem List). Pt was able to ambulate with RW with better gait stability than without.  Pt states she uses rollator outdoors and uses furniture indoors as she lives in very small apartment.  Pt appears close to baseline and probably does well in familiar environment.  Recommend HHPT safety eval for pt and will follow acutely. Pt will benefit from skilled PT to increase their independence and safety with mobility to allow discharge to the venue listed below.      Follow Up Recommendations Home health PT;Supervision - Intermittent    Equipment Recommendations  None recommended by PT    Recommendations for Other Services       Precautions / Restrictions Precautions Precautions: Fall Restrictions Weight Bearing Restrictions: No      Mobility  Bed Mobility Overal bed mobility: Independent             General bed mobility comments: pt up in recliner upon OT arrival  Transfers Overall transfer level: Needs assistance Equipment used: None;Rolling walker (2 wheeled) Transfers: Sit to/from Stand Sit to Stand: Supervision;Min guard         General transfer comment: No assist for pt to sit to stand.   Ambulation/Gait Ambulation/Gait assistance: Supervision Gait Distance (Feet): 350 Feet Assistive device: Rolling walker (2  wheeled) Gait Pattern/deviations: Step-through pattern;Decreased stride length;Drifts right/left Gait velocity: Decreased Gait velocity interpretation: 1.31 - 2.62 ft/sec, indicative of limited community ambulator General Gait Details: Pt ambulating well with RW, only requiring supervision due to visual difficulties which is more of an issue in this unfamiliar environement. Pt navigates well even wtih visual issues and when she does run into objexts she is  able to self-correct without cues; no DOE noted or rest breaks needed. SpO2 92-93% on RA.  Occasional assist to guide RW due to visual impairments,.Pt has a diabetic shoe for left foot and a cast shoe for the right due to foot ulcer.   Stairs            Wheelchair Mobility    Modified Rankin (Stroke Patients Only)       Balance Overall balance assessment: Needs assistance Sitting-balance support: No upper extremity supported;Feet supported Sitting balance-Leahy Scale: Good     Standing balance support: During functional activity;Bilateral upper extremity supported;No upper extremity supported Standing balance-Leahy Scale: Fair Standing balance comment: Amb in room without DME support, intermittently reaching for furniture which pt states she does at home.                              Pertinent Vitals/Pain Pain Assessment: No/denies pain Pain Intervention(s): Monitored during session    Home Living Family/patient expects to be discharged to:: Private residence Living Arrangements: Alone Available Help at Discharge: Family;Available PRN/intermittently Type of Home: Apartment Home Access: Stairs to enter Entrance Stairs-Rails:  Right Entrance Stairs-Number of Steps: 1 Home Layout: One level Home Equipment: Walker - 4 wheels;Cane - single point;Tub bench;Grab bars - tub/shower Additional Comments: Senior living apartment    Prior Function Level of Independence: Independent with assistive device(s)   Gait /  Transfers Assistance Needed: Household ambulation without DME. Uses rollator for community ambulation. Enjoys walking to community center nearby  ADL's / Homemaking Assistance Needed: independent with ADLs, light meal prep, Daughter drives for errands; uses transportation to MD appointments  Comments: Poor vision; typically only walks in familiar environments     Hand Dominance   Dominant Hand: Right    Extremity/Trunk Assessment   Upper Extremity Assessment Upper Extremity Assessment: Defer to OT evaluation    Lower Extremity Assessment Lower Extremity Assessment: Generalized weakness    Cervical / Trunk Assessment Cervical / Trunk Assessment: Normal  Communication   Communication: No difficulties  Cognition Arousal/Alertness: Awake/alert Behavior During Therapy: WFL for tasks assessed/performed Overall Cognitive Status: No family/caregiver present to determine baseline cognitive functioning Area of Impairment: Attention                   Current Attention Level: Selective           General Comments: pt easily distracted      General Comments General comments (skin integrity, edema, etc.): SpO2 >92% on RA.     Exercises General Exercises - Lower Extremity Ankle Circles/Pumps: AROM;Both;10 reps;Seated Long Arc Quad: AROM;Both;10 reps;Seated   Assessment/Plan    PT Assessment Patient needs continued PT services  PT Problem List Decreased strength;Decreased activity tolerance;Decreased balance;Decreased mobility;Cardiopulmonary status limiting activity;Decreased knowledge of use of DME;Decreased safety awareness       PT Treatment Interventions Gait training;Stair training;Functional mobility training;Therapeutic activities;DME instruction;Therapeutic exercise;Balance training;Patient/family education    PT Goals (Current goals can be found in the Care Plan section)  Acute Rehab PT Goals Patient Stated Goal: Return home PT Goal Formulation: With  patient Time For Goal Achievement: 04/28/18 Potential to Achieve Goals: Good    Frequency Min 3X/week   Barriers to discharge        Co-evaluation               AM-PAC PT "6 Clicks" Mobility  Outcome Measure Help needed turning from your back to your side while in a flat bed without using bedrails?: None Help needed moving from lying on your back to sitting on the side of a flat bed without using bedrails?: None Help needed moving to and from a bed to a chair (including a wheelchair)?: None Help needed standing up from a chair using your arms (e.g., wheelchair or bedside chair)?: None Help needed to walk in hospital room?: A Little Help needed climbing 3-5 steps with a railing? : A Little 6 Click Score: 22    End of Session Equipment Utilized During Treatment: Gait belt Activity Tolerance: Patient tolerated treatment well Patient left: in chair;with call bell/phone within reach;with chair alarm set;with family/visitor present Nurse Communication: Mobility status PT Visit Diagnosis: Other abnormalities of gait and mobility (R26.89)    Time: 0092-3300 PT Time Calculation (min) (ACUTE ONLY): 13 min   Charges:   PT Evaluation $PT Eval Moderate Complexity: 1 Mod          Brielynn Sekula,PT Acute Rehabilitation Services Pager:  [phone]  Office:  [employer] 04/14/2018, 1:31 PM

## 2018-04-14 NOTE — Evaluation (Signed)
Occupational Therapy Evaluation Patient Details Name: Kristina Summers MRN: [id number] DOB: [birth year] Today's Date: 04/14/2018    History of Present Illness pt presents with c/o headache and exertional dyspnea with  h/o  DM-2, diastolic CHF, CKD 4, CAD status post CABG in 2000, AS status post TVAR in 10/2017, PVD with multiple stents, A. fib on Eliquis, CVA with speech abnormality and GI bleed who [facility] to 66/5/99 for diastolic CHF exacerbation and discharged with increased dose of Lasix      Clinical Impression   Pt with decline in function and safety with ADLs and ADL mobility with decreased strength, balance and endurance. Pt with >90% O2 SATs during session. Pt reports that she lives at home alone and is independent, uses an AD for mobility and daughter lives nearby. Pt would benefit from acute OT services to address impairments to maximize level of function and safety    Follow Up Recommendations  Home health OT    Equipment Recommendations  None recommended by OT    Recommendations for Other Services       Precautions / Restrictions Precautions Precautions: Fall Restrictions Weight Bearing Restrictions: No      Mobility Bed Mobility               General bed mobility comments: pt up in recliner upon OT arrival  Transfers Overall transfer level: Needs assistance Equipment used: None;Rolling walker (2 wheeled) Transfers: Sit to/from Stand Sit to Stand: Supervision;Min guard         General transfer comment: assist to guide RW due to visual impairments, bumping into obstacles     Balance Overall balance assessment: Needs assistance Sitting-balance support: No upper extremity supported;Feet supported Sitting balance-Leahy Scale: Good     Standing balance support: Single extremity supported;During functional activity;Bilateral upper extremity supported Standing balance-Leahy Scale: Fair                              ADL either performed or assessed with clinical judgement   ADL Overall ADL's : Needs assistance/impaired     Grooming: Wash/dry hands;Wash/dry face;Standing;Supervision/safety;Set up   Upper Body Bathing: Set up;Sitting   Lower Body Bathing: Set up;Minimal assistance;Sitting/lateral leans;Sit to/from stand   Upper Body Dressing : Set up;Sitting   Lower Body Dressing: Min guard;Supervision/safety;Sit to/from stand;Sitting/lateral leans   Toilet Transfer: Min guard;Supervision/safety;Ambulation;RW;Grab bars;Comfort height toilet Toilet Transfer Details (indicate cue type and reason): assist to guide RW due to visual impairments Toileting- Clothing Manipulation and Hygiene: Sit to/from stand;Min guard   Tub/ Shower Transfer: Agricultural engineer;Ambulation;3 in 1;Grab bars   Functional mobility during ADLs: Min guard;Rolling walker General ADL Comments: assist to guide RW due to visual impairments     Vision Baseline Vision/History: Legally blind Patient Visual Report: No change from baseline       Perception     Praxis      Pertinent Vitals/Pain Pain Assessment: No/denies pain Pain Intervention(s): Monitored during session     Hand Dominance Right   Extremity/Trunk Assessment Upper Extremity Assessment Upper Extremity Assessment: Overall WFL for tasks assessed   Lower Extremity Assessment Lower Extremity Assessment: Defer to PT evaluation       Communication Communication Communication: No difficulties   Cognition Arousal/Alertness: Awake/alert Behavior During Therapy: WFL for tasks assessed/performed Overall Cognitive Status: No family/caregiver present to determine baseline cognitive functioning Area of Impairment: Attention  General Comments: pt easily distracted   General Comments       Exercises     Shoulder Instructions      Home Living Family/patient expects to be discharged to:: Private  residence Living Arrangements: Alone Available Help at Discharge: Family;Available PRN/intermittently Type of Home: Apartment Home Access: Stairs to enter Entrance Stairs-Number of Steps: 1 Entrance Stairs-Rails: Right Home Layout: One level     Bathroom Shower/Tub: Teacher, early years/pre: Standard     Home Equipment: Environmental consultant - 4 wheels;Cane - single point;Tub bench;Grab bars - tub/shower   Additional Comments: Senior living apartment      Prior Functioning/Environment Level of Independence: Independent with assistive device(s)  Gait / Transfers Assistance Needed: Household ambulation without DME. Uses rollator for community ambulation. Enjoys walking to community center nearby ADL's / Homemaking Assistance Needed: independent with ADLs, light meal prep, Daughter drives for errands; uses transportation to MD appointments   Comments: Poor vision; typically only walks in familiar environments        OT Problem List: Decreased strength;Decreased activity tolerance;Decreased knowledge of use of DME or AE;Impaired balance (sitting and/or standing)      OT Treatment/Interventions: Self-care/ADL training;Therapeutic exercise;Therapeutic activities;Patient/family education;Energy conservation    OT Goals(Current goals can be found in the care plan section) Acute Rehab OT Goals Patient Stated Goal: Return home OT Goal Formulation: With patient Time For Goal Achievement: 04/28/18 Potential to Achieve Goals: Good ADL Goals Pt Will Perform Grooming: with set-up;with modified independence;standing Pt Will Perform Lower Body Bathing: with supervision;with set-up;with modified independence;sitting/lateral leans;sit to/from stand Pt Will Perform Lower Body Dressing: with supervision;with set-up;with modified independence;sit to/from stand Pt Will Transfer to Toilet: with supervision;with modified independence;ambulating;regular height toilet;grab bars Pt Will Perform Toileting  - Clothing Manipulation and hygiene: with supervision;with modified independence  OT Frequency: Min 2X/week   Barriers to D/C:    no barriers       Co-evaluation              AM-PAC OT "6 Clicks" Daily Activity     Outcome Measure Help from another person eating meals?: None Help from another person taking care of personal grooming?: A Little Help from another person toileting, which includes using toliet, bedpan, or urinal?: A Little Help from another person bathing (including washing, rinsing, drying)?: A Little Help from another person to put on and taking off regular upper body clothing?: None Help from another person to put on and taking off regular lower body clothing?: A Little 6 Click Score: 20   End of Session Equipment Utilized During Treatment: Gait belt;Rolling walker Nurse Communication: Mobility status  Activity Tolerance: Patient tolerated treatment well Patient left: in chair;with call bell/phone within reach;with chair alarm set  OT Visit Diagnosis: Unsteadiness on feet (R26.81);Other abnormalities of gait and mobility (R26.89);Muscle weakness (generalized) (M62.81)                Time: 6568-1275 OT Time Calculation (min): 25 min Charges:  OT General Charges $OT Visit: 1 Visit OT Evaluation $OT Eval Moderate Complexity: 1 Mod OT Treatments $Therapeutic Activity: 8-22 mins    Britt Bottom 04/14/2018, 12:57 PM

## 2018-04-14 NOTE — Progress Notes (Addendum)
  Progress Note    04/14/2018 9:59 AM 1 Day Post-Op  Subjective:  Says her headache has resolved and she can eat and drink today without discomfort    Vitals:   04/13/18 2109 04/14/18 0522  BP: (!) 149/53 (!) 146/71  Pulse: (!) 56 64  Resp: 20 16  Temp: 97.6 F (36.4 C) 97.6 F (36.4 C)  SpO2: 94% 97%    Physical Exam: General:  No distress Lungs:  Non labored Incisions:  Clean and dry    CBC    Component Value Date/Time   WBC 10.1 04/14/2018 0850   RBC 3.48 (L) 04/14/2018 0850   HGB 9.2 (L) 04/14/2018 0850   HGB 7.3 (L) 02/28/2018 1137   HCT 30.5 (L) 04/14/2018 0850   HCT 23.3 (L) 02/28/2018 1137   PLT 230 04/14/2018 0850   PLT 228 02/28/2018 1137   MCV 87.6 04/14/2018 0850   MCV 89 02/28/2018 1137   MCH 26.4 04/14/2018 0850   MCHC 30.2 04/14/2018 0850   RDW 19.9 (H) 04/14/2018 0850   RDW 14.0 02/28/2018 1137   LYMPHSABS 0.3 (L) 04/14/2018 0850   MONOABS 0.1 04/14/2018 0850   EOSABS 0.1 04/14/2018 0850   BASOSABS 0.0 04/14/2018 0850    BMET    Component Value Date/Time   NA 134 (L) 04/14/2018 0850   NA 140 11/17/2017 1510   K 4.3 04/14/2018 0850   CL 105 04/14/2018 0850   CO2 22 04/14/2018 0850   GLUCOSE 256 (H) 04/14/2018 0850   BUN 71 (H) 04/14/2018 0850   BUN 28 (H) 11/17/2017 1510   CREATININE 1.91 (H) 04/14/2018 0850   CREATININE 1.54 (H) 01/26/2018 1127   CALCIUM 8.9 04/14/2018 0850   GFRNONAA 24 (L) 04/14/2018 0850   GFRAA 28 (L) 04/14/2018 0850    INR    Component Value Date/Time   INR 1.24 03/20/2018 1721     Intake/Output Summary (Last 24 hours) at 04/14/2018 0959 Last data filed at 04/14/2018 0522 Gross per 24 hour  Intake 640 ml  Output 1900 ml  Net -1260 ml     Assessment:  82 y.o. female is s/p:  Right temporal artery bx  1 Day Post-Op  Plan: -pt doing well this am  -HA resolved and eating/drinking better -incision is clean and dry -f/u with PCP for path results -f/u with Dr. Donnetta Hutching as needed -will sign  off-please re-consult if needed.    Leontine Locket, PA-C [facility] [phone] 04/14/2018 9:59 AM

## 2018-04-14 NOTE — Progress Notes (Signed)
PROGRESS NOTE  Kristina Summers UVO:[id number] DOB: 21-Dec-1935 DOA: 04/10/2018 PCP: [facility] [place]  HPI/Recap of past 24 hours: Kristina Summers a 82 y.o.femalewith history h/oDM-2,diastolic CHF, CKD 4, CAD status post CABG in 2000,AS status post TVAR in 10/2017,PVD with multiple stents,A. fib on Eliquis, CVA with speech abnormalityandGI bleed who [facility] to 12/5/19fordiastolic CHF exacerbation and discharged with increased dose of Lasix presents now with complaints of headache and exertional dyspnea.   Work-up in the ED revealed elevated ESR at 127(although was 136 in November), BNP at 2800 (was 1200 previously) chest x-ray shows pulmonary edema. She is saturating well on room air and her blood pressure is elevated at systolic 742. Patient also noted to have foul-smelling wound on the right foot. She states she has not been able to afford seeing a podiatrist and has home wound care nurse visit her twice a week for wound care dressings. She denies any recent pustular drainage or bleeding.  04/14/2018: Patient seen and examined with her son and daughter-in-law at bedside.  No acute events overnight.  She has no new complaints.  Uncontrolled hyperglycemia.    Assessment/Plan: Active Problems:   Paroxysmal atrial fibrillation   Diabetes mellitus type 2 with peripheral artery disease (HCC)   Hyperlipidemia   S/P CABG (coronary artery bypass graft)   History of GI bleed   Essential hypertension   Type II diabetes mellitus with renal manifestations (HCC)   S/P TAVR (transcatheter aortic valve replacement)   CKD (chronic kidney disease), stage III (HCC)   Foot ulcer, right (HCC)   CHF exacerbation (HCC)  Acute on chronic diastolic CHF: Continue IV lasix 40 mg BID.  Strict intake and outptut and daily weights. Diuresed about 2100 ml last nght.  Repeat BMP dhows slight worsening of creatinine, we will hold the lasix today and repeat creatinine  is am, to make sure it is improving. She currently denies any chest pain or sob.   AKI on CKD 4 Baseline creatinine 1.6 with GFR of 29 Creatinine of 2.03 Avoid nephrotoxic agents/hypotension Creatinine is trending down to 1.91 Monitor urine output Repeat BMP in the morning  Atrial fibrillation , permanent and rate controlled.  - on eliquis for anti coagulation and BB for rate control.  H/o GI bleed.  S/p EGD and capsule study.  No obvious signs of bleeding this admission.   Temporal headache.  Vascular surgery consulted for biopsy of the temporal artery, underwent on 12/26,  to evalaute for temporal arteritis.  On prednisone 60 mg daily. She reports her headache has resolved.   Hypertension:  Well controlled.   Hyperlipidemia:  Resume statin.    Diabetes Mellitus:  insulin dependent.  CBG (last 3)  RecentLabs(last2labs)       Recent Labs    04/13/18 0742 04/13/18 1130 04/13/18 1311  GLUCAP 299* 202* 202*     Not well controlled secondary to prednisone.  increase to 5  units of novolog TIDAC, Increased HUMULIN to 12 at night and 18 in am.  Changed to resistant SSI.  Obtain hemoglobin A1c  Right foot ulcer:  Chronic, will need podiatry follow up on discharge.  Wound care consulted and recommendations given.  X ray of the right foot shows Ulceration in the soft tissues adjacent to the fifth metatarsal phalangeal joint without evidence of osteomyelitis. Chronic needle-like foreign bodies adjacent to the first metatarsal phalangeal joint. Complete a course of doxycycline for the right foot ulcer.   Ambulatory dysfunction/physical debility PT recommended home health  PT Fall precautions   DVT prophylaxis:Eliquis.  Code Status: DNR  Family Communication: none at bedside.  Disposition Plan: pending clinical improvement and temporal artery biopsy.    Consultants:   Vascular surgery.   Procedures: None.   Antimicrobials: Doxycycline.       Objective: Vitals:   04/13/18 1650 04/13/18 2109 04/14/18 0522 04/14/18 1433  BP: 135/72 (!) 149/53 (!) 146/71 (!) 146/51  Pulse: 67 (!) 56 64 (!) 47  Resp: '20 20 16 20  '$ Temp: (!) 97.4 F (36.3 C) 97.6 F (36.4 C) 97.6 F (36.4 C) (!) 97.5 F (36.4 C)  TempSrc: Oral Oral Oral Oral  SpO2: 95% 94% 97% 97%  Weight:   81.1 kg   Height:        Intake/Output Summary (Last 24 hours) at 04/14/2018 1839 Last data filed at 04/14/2018 1400 Gross per 24 hour  Intake 600 ml  Output 1500 ml  Net -900 ml   Filed Weights   04/12/18 0410 04/13/18 0608 04/14/18 0522  Weight: 81.5 kg 81.6 kg 81.1 kg    Exam:  . General: 82 y.o. year-old female well developed well nourished in no acute distress.  Alert and oriented x3. . Cardiovascular: Regular rate and rhythm with no rubs or gallops.  No thyromegaly or JVD noted.   Marland Kitchen Respiratory: Clear to auscultation with no wheezes or rales. Good inspiratory effort. . Abdomen: Soft nontender nondistended with normal bowel sounds x4 quadrants. . Musculoskeletal: Trace lower extremity edema. 2/4 pulses in all 4 extremities. . Skin: Right lateral foot wound noted with no purulence. Marland Kitchen Psychiatry: Mood is appropriate for condition and setting   Data Reviewed: CBC: Recent Labs  Lab 04/10/18 1147 04/11/18 0713 04/12/18 1448 04/14/18 0850  WBC 8.2 8.5 12.1* 10.1  NEUTROABS 6.4  --   --  9.6*  HGB 8.8* 8.7* 8.8* 9.2*  HCT 29.4* 28.5* 28.4* 30.5*  MCV 86.7 85.8 86.1 87.6  PLT 296 255 280 726   Basic Metabolic Panel: Recent Labs  Lab 04/10/18 1147 04/11/18 0713 04/12/18 1448 04/13/18 0919 04/14/18 0850  NA 134* 137 131* 133* 134*  K 3.7 4.6 4.2 4.9 4.3  CL 107 106 101 103 105  CO2 18* 21* '23 23 22  '$ GLUCOSE 193* 328* 237* 320* 256*  BUN 27* 34* 59* 68* 71*  CREATININE 1.67* 1.85* 1.97* 2.03* 1.91*  CALCIUM 8.7* 8.8* 8.7* 9.0 8.9   GFR: Estimated Creatinine Clearance: 25.2 mL/min (A) (by C-G formula based on SCr of 1.91 mg/dL  (H)). Liver Function Tests: No results for input(s): AST, ALT, ALKPHOS, BILITOT, PROT, ALBUMIN in the last 168 hours. No results for input(s): LIPASE, AMYLASE in the last 168 hours. No results for input(s): AMMONIA in the last 168 hours. Coagulation Profile: No results for input(s): INR, PROTIME in the last 168 hours. Cardiac Enzymes: Recent Labs  Lab 04/10/18 1147  TROPONINI 0.03*   BNP (last 3 results) No results for input(s): PROBNP in the last 8760 hours. HbA1C: No results for input(s): HGBA1C in the last 72 hours. CBG: Recent Labs  Lab 04/13/18 1648 04/13/18 2148 04/14/18 0750 04/14/18 1236 04/14/18 1645  GLUCAP 281* 396* 236* 261* 232*   Lipid Profile: No results for input(s): CHOL, HDL, LDLCALC, TRIG, CHOLHDL, LDLDIRECT in the last 72 hours. Thyroid Function Tests: No results for input(s): TSH, T4TOTAL, FREET4, T3FREE, THYROIDAB in the last 72 hours. Anemia Panel: No results for input(s): VITAMINB12, FOLATE, FERRITIN, TIBC, IRON, RETICCTPCT in the last 72 hours. Urine analysis:  Component Value Date/Time   COLORURINE YELLOW 03/20/2018 1721   APPEARANCEUR HAZY (A) 03/20/2018 1721   LABSPEC 1.013 03/20/2018 1721   PHURINE 6.0 03/20/2018 1721   GLUCOSEU >=500 (A) 03/20/2018 1721   HGBUR LARGE (A) 03/20/2018 1721   BILIRUBINUR NEGATIVE 03/20/2018 1721   KETONESUR NEGATIVE 03/20/2018 1721   PROTEINUR 100 (A) 03/20/2018 1721   UROBILINOGEN 1.0 05/29/2014 1052   NITRITE NEGATIVE 03/20/2018 1721   LEUKOCYTESUR MODERATE (A) 03/20/2018 1721   Sepsis Labs: '@LABRCNTIP'$ (procalcitonin:4,lacticidven:4)  ) Recent Results (from the past 240 hour(s))  Surgical pcr screen     Status: Abnormal   Collection Time: 04/13/18 12:34 AM  Result Value Ref Range Status   MRSA, PCR POSITIVE (A) NEGATIVE Final    Comment: RESULT CALLED TO, READ BACK BY AND VERIFIED WITH: H HAMLIN RN 04/13/18 0318 JDW    Staphylococcus aureus POSITIVE (A) NEGATIVE Final    Comment: (NOTE) The  Xpert SA Assay (FDA approved for NASAL specimens in patients 19 years of age and older), is one component of a comprehensive surveillance program. It is not intended to diagnose infection nor to guide or monitor treatment. Performed at [facility], [place] [address], [place], [place] [address]       Studies: No results found.  Scheduled Meds: . amLODipine  10 mg Oral Daily  . apixaban  2.5 mg Oral BID  . atorvastatin  80 mg Oral QHS  . doxycycline  100 mg Oral Q12H  . insulin aspart  0-20 Units Subcutaneous TID WC  . insulin aspart  0-5 Units Subcutaneous QHS  . insulin aspart  5 Units Subcutaneous TID WC  . insulin NPH Human  12 Units Subcutaneous QAC supper  . insulin NPH Human  18 Units Subcutaneous QAC breakfast  . metoprolol tartrate  50 mg Oral BID  . multivitamin with minerals  1 tablet Oral Daily  . mupirocin ointment   Nasal Daily  . nutrition supplement (JUVEN)  1 packet Oral BID BM  . pantoprazole  40 mg Oral Daily  . predniSONE  60 mg Oral Q breakfast    Continuous Infusions:   LOS: 4 days     Kayleen Memos, MD [facility] Pager [phone]  If 7PM-7AM, please contact night-coverage [email or web address] Password [facility] 04/14/2018, 6:39 PM

## 2018-04-14 NOTE — Progress Notes (Signed)
Inpatient Diabetes Program Recommendations  AACE/ADA: New Consensus Statement on Inpatient Glycemic Control (2015)  Target Ranges:  Prepandial:   less than 140 mg/dL      Peak postprandial:   less than 180 mg/dL (1-2 hours)      Critically ill patients:  140 - 180 mg/dL   Lab Results  Component Value Date   GLUCAP 261 (H) 04/14/2018   HGBA1C 6.0 (H) 11/04/2017    Review of Glycemic Control Results for Kristina Summers, Kristina Summers (MRN [id number]) as of 04/14/2018 13:53  Ref. Range 04/13/2018 07:42 04/13/2018 11:30 04/13/2018 13:11 04/13/2018 16:48 04/13/2018 21:48 04/14/2018 07:50  Glucose-Capillary Latest Ref Range: 70 - 99 mg/dL 299 (H) 202 (H) 202 (H) 281 (H) 396 (H) 236 (H)   Diabetes history: DM2 Outpatient Diabetes medications: NPH 15 units am / 10 units pm; Nov regular 5 units BID [facility] Current orders for Inpatient glycemic control: NPH 18 units am / 12 units pm; Nov regular 5 units BID [facility]  Inpatient Diabetes Program Recommendations:   Noted hyperglycemia While on steroids: -Increase Novolog meal coverage to 8 units if eats 50% (will need to decrease as steroids decrease)  Thank you, Bethena Roys E. Jadelynn Boylan, RN, MSN, CDE  Diabetes Coordinator Inpatient Glycemic Control Team Team Pager [phone] (8am-5pm) 04/14/2018 1:55 PM

## 2018-04-15 LAB — CBC WITH DIFFERENTIAL/PLATELET
Abs Immature Granulocytes: 0.04 10*3/uL (ref 0.00–0.07)
Basophils Absolute: 0 10*3/uL (ref 0.0–0.1)
Basophils Relative: 0 %
Eosinophils Absolute: 0 10*3/uL (ref 0.0–0.5)
Eosinophils Relative: 0 %
HCT: 27.5 % — ABNORMAL LOW (ref 36.0–46.0)
Hemoglobin: 8.6 g/dL — ABNORMAL LOW (ref 12.0–15.0)
IMMATURE GRANULOCYTES: 0 %
LYMPHS ABS: 0.7 10*3/uL (ref 0.7–4.0)
Lymphocytes Relative: 8 %
MCH: 27.1 pg (ref 26.0–34.0)
MCHC: 31.3 g/dL (ref 30.0–36.0)
MCV: 86.8 fL (ref 80.0–100.0)
Monocytes Absolute: 0.5 10*3/uL (ref 0.1–1.0)
Monocytes Relative: 5 %
Neutro Abs: 8 10*3/uL — ABNORMAL HIGH (ref 1.7–7.7)
Neutrophils Relative %: 87 %
Platelets: 211 10*3/uL (ref 150–400)
RBC: 3.17 MIL/uL — ABNORMAL LOW (ref 3.87–5.11)
RDW: 19.5 % — ABNORMAL HIGH (ref 11.5–15.5)
WBC: 9.2 10*3/uL (ref 4.0–10.5)
nRBC: 0 % (ref 0.0–0.2)

## 2018-04-15 LAB — GLUCOSE, CAPILLARY
Glucose-Capillary: 120 mg/dL — ABNORMAL HIGH (ref 70–99)
Glucose-Capillary: 204 mg/dL — ABNORMAL HIGH (ref 70–99)

## 2018-04-15 LAB — BASIC METABOLIC PANEL
Anion gap: 11 (ref 5–15)
BUN: 73 mg/dL — ABNORMAL HIGH (ref 8–23)
CO2: 19 mmol/L — ABNORMAL LOW (ref 22–32)
Calcium: 8.7 mg/dL — ABNORMAL LOW (ref 8.9–10.3)
Chloride: 106 mmol/L (ref 98–111)
Creatinine, Ser: 1.68 mg/dL — ABNORMAL HIGH (ref 0.44–1.00)
GFR calc Af Amer: 33 mL/min — ABNORMAL LOW (ref >60)
GFR calc non Af Amer: 28 mL/min — ABNORMAL LOW (ref >60)
Glucose, Bld: 109 mg/dL — ABNORMAL HIGH (ref 70–99)
Potassium: 4.7 mmol/L (ref 3.5–5.1)
Sodium: 136 mmol/L (ref 135–145)

## 2018-04-15 LAB — HEMOGLOBIN A1C
Hgb A1c MFr Bld: 7.2 % — ABNORMAL HIGH (ref 4.8–5.6)
Mean Plasma Glucose: 159.94 mg/dL

## 2018-04-15 MED ORDER — INSULIN NPH (HUMAN) (ISOPHANE) 100 UNIT/ML ~~LOC~~ SUSP: 18.0000 [IU] | Freq: Every day | SUBCUTANEOUS | 0 refills | Status: DC

## 2018-04-15 MED ORDER — PREDNISONE 50 MG PO TABS: 50.0000 mg | ORAL_TABLET | Freq: Every day | ORAL | Status: DC

## 2018-04-15 MED ORDER — INSULIN ASPART 100 UNIT/ML ~~LOC~~ SOLN
8.0000 [IU] | Freq: Three times a day (TID) | SUBCUTANEOUS | Status: DC
  Administered 2018-04-15: 8 [IU] via SUBCUTANEOUS

## 2018-04-15 MED ORDER — APIXABAN 2.5 MG PO TABS: 2.5000 mg | ORAL_TABLET | Freq: Two times a day (BID) | ORAL | 0 refills | Status: DC

## 2018-04-15 MED ORDER — INSULIN NPH (HUMAN) (ISOPHANE) 100 UNIT/ML ~~LOC~~ SUSP: 12.0000 [IU] | Freq: Every day | SUBCUTANEOUS | 0 refills | Status: DC

## 2018-04-15 MED ORDER — DOXYCYCLINE HYCLATE 100 MG PO TABS: 100.0000 mg | ORAL_TABLET | Freq: Every day | ORAL | 0 refills | Status: DC

## 2018-04-15 MED ORDER — PREDNISONE 50 MG PO TABS: ORAL_TABLET | ORAL | 0 refills | Status: DC

## 2018-04-15 MED ORDER — FUROSEMIDE 40 MG PO TABS: 40.0000 mg | ORAL_TABLET | Freq: Every day | ORAL | 1 refills | Status: DC

## 2018-04-15 MED ORDER — JUVEN PO PACK: 1.0000 | PACK | Freq: Two times a day (BID) | ORAL | 0 refills | Status: DC

## 2018-04-15 MED ORDER — INSULIN ASPART 100 UNIT/ML ~~LOC~~ SOLN: 8.0000 [IU] | Freq: Three times a day (TID) | SUBCUTANEOUS | 0 refills | Status: DC

## 2018-04-15 MED ORDER — METOPROLOL TARTRATE 25 MG PO TABS: 25.0000 mg | ORAL_TABLET | Freq: Two times a day (BID) | ORAL | Status: DC

## 2018-04-15 NOTE — Discharge Instructions (Signed)
Heart Failure  Heart failure means your heart has trouble pumping blood. This makes it hard for your body to work well. Heart failure is usually a long-term (chronic) condition. You must take good care of yourself and follow your treatment plan from your doctor. Follow these instructions at home: Medicines  Take over-the-counter and prescription medicines only as told by your doctor. ? Do not stop taking your medicine unless your doctor told you to do that. ? Do not skip any doses. ? Refill your prescriptions before you run out of medicine. You need your medicines every day. Eating and drinking   Eat heart-healthy foods. Talk with a diet and nutrition specialist (dietitian) to make an eating plan.  Choose foods that: ? Have no trans fat. ? Are low in saturated fat and cholesterol.  Choose healthy foods, like: ? Fresh or frozen fruits and vegetables. ? Fish. ? Low-fat (lean) meats. ? Legumes (like beans, peas, and lentils). ? Fat-free or low-fat dairy products. ? Whole-grain foods. ? High-fiber foods.  Limit salt (sodium) if told by your doctor. Ask your nutrition specialist to recommend heart-healthy seasonings.  Cook in healthy ways instead of frying. Healthy ways of cooking include: ? Roasting. ? Grilling. ? Broiling. ? Baking. ? Poaching. ? Steaming. ? Stir-frying.  Limit how much fluid you drink, if told by your doctor. Lifestyle  Do not smoke or use chewing tobacco. Do not use nicotine gum or patches before talking to your doctor.  Limit alcohol intake to no more than 1 drink a day for non-pregnant women and 2 drinks a day for men. One drink equals 12 oz of beer, 5 oz of wine, or 1 oz of hard liquor. ? Tell your doctor if you drink alcohol many times a week. ? Talk with your doctor about whether any alcohol is safe for you. ? You should stop drinking alcohol: ? If your heart has been damaged by alcohol. ? You have very bad heart failure.  Do not use illegal  drugs.  Lose weight if told by your doctor.  Do moderate physical activity if told by your doctor. Ask your doctor what activities are safe for you if: ? You are of older age (elderly). ? You have very bad heart failure. Keep track of important information  Weigh yourself every day. ? Weigh yourself every morning after you pee (urinate) and before breakfast. ? Wear the same amount of clothing each time. ? Write down your daily weight. Give your record to your doctor.  Check and write down your blood pressure as told by your doctor.  Check your pulse as told by your doctor. Dealing with heat and cold  If the weather is very hot: ? Avoid activity that takes a lot of energy. ? Use air conditioning or fans, or find a cooler place. ? Avoid caffeine. ? Avoid alcohol. ? Wear clothing that is loose-fitting, lightweight, and light-colored.  If the weather is very cold: ? Avoid activity that takes a lot of energy. ? Layer your clothes. ? Wear mittens or gloves, a hat, and a scarf when you go outside. ? Avoid alcohol. General instructions  Manage other conditions that you have as told by your doctor.  Learn to manage stress. If you need help, ask your doctor.  Plan rest periods for when you get tired.  Get education and support as needed.  Get rehab (rehabilitation) to help you stay independent and to help with everyday tasks.  Stay up to date with shots (  immunizations), especially pneumococcal and flu (influenza) shots.  Keep all follow-up visits as told by your doctor. This is important. Contact a doctor if:  You gain weight quickly.  You are more short of breath than normal.  You cannot do your normal activities.  You tire easily.  You cough more than normal, especially with activity.  You have any or more puffiness (swelling) in areas such as your hands, feet, ankles, or belly (abdomen).  You cannot sleep because it is hard to breathe.  You feel like your heart  is beating fast (palpitations).  You get dizzy or light-headed when you stand up. Get help right away if:  You have trouble breathing.  You or someone else notices a change in your awareness. This could be trouble staying awake or trouble concentrating.  You have chest pain or discomfort.  You pass out (faint). Summary  Heart failure means your heart has trouble pumping blood.  Make sure you refill your prescriptions before you run out of medicine. You need your medicines every day.  Keep records of your weight and blood pressure to give to your doctor.  Contact a doctor if you gain weight quickly. This information is not intended to replace advice given to you by your health care provider. Make sure you discuss any questions you have with your health care provider. Document Released: 01/13/2008 Document Revised: 12/28/2017 Document Reviewed: 04/27/2016 Elsevier Interactive Patient Education  2019 [employer]   Heart Failure Exacerbation  Heart failure is a condition in which the heart does not fill up with enough blood, and therefore does not pump enough blood and oxygen to the body. When this happens, parts of the body do not get the blood and oxygen they need to function properly. This can cause symptoms such as breathing problems, fatigue, swelling, and confusion. Heart failure exacerbation refers to heart failure symptoms that get worse. The symptoms may get worse suddenly or develop slowly over time. Heart failure exacerbation is a serious medical problem that should be treated right away. What are the causes? A heart failure exacerbation can be triggered by:  Not taking your heart failure medicines correctly.  Infections.  Eating an unhealthy diet or a diet that is high in salt (sodium).  Drinking too much fluid.  Drinking alcohol.  Taking illegal drugs, such as cocaine or methamphetamine.  Not exercising. Other causes include:  Other heart conditions such as  an irregular heartbeat (arrhythmia).  Anemia.  Other medical problems, such as kidney failure. Sometimes the cause of the exacerbation is not known. What are the signs or symptoms? When heart failure symptoms suddenly or slowly get worse, this may be a sign of heart failure exacerbation. Symptoms of heart failure include:  Breathing problems or shortness of breath.  Chronic coughing or wheezing.  Fatigue.  Nausea or lack of appetite.  Feeling light-headed.  Confusion or memory loss.  Increased heart rate or irregular heartbeat.  Buildup of fluid in the legs, ankles, feet, or abdomen.  Difficulty breathing when lying down. How is this diagnosed? This condition is diagnosed based on:  Your symptoms and medical history.  A physical exam. You may also have tests, including:  Electrocardiogram (ECG). This test measures the electrical activity of your heart.  Echocardiogram. This test uses sound waves to take a picture of your heart to see how well it works.  Blood tests.  Imaging tests, such as: ? Chest X-ray. ? MRI. ? Ultrasound.  Stress test. This test examines how  well your heart functions when you exercise. Your heart is monitored while you exercise on a treadmill or exercise bike. If you cannot exercise, medicines may be used to increase your heartbeat in place of exercise.  Cardiac catheterization. During this test, a thin, flexible tube (catheter) is inserted into a blood vessel and threaded up to your heart. This test allows your health care provider to check the arteries that lead to your heart (coronary arteries).  Right heart catheterization. During this test, the pressure in your heart is measured. How is this treated? This condition may be treated by:  Adjusting your heart medicines.  Maintaining a healthy lifestyle. This includes: ? Eating a heart-healthy diet that is low in sodium. ? Not using any products that contain nicotine or tobacco, such as  cigarettes and e-cigarettes. ? Regular exercise. ? Monitoring your fluid intake. ? Monitoring your weight and reporting changes to your health care provider.  Treating sleep apnea, if you have this condition.  Surgery. This may include: ? Implanting a device that helps both sides of your heart contract at the same time (cardiac resynchronization therapy device). This can help with heart function and relieve heart failure symptoms. ? Implanting a device that can correct heart rhythm problems (implantable cardioverter defibrillator). ? Connecting a device to your heart to help it pump blood (ventricular assist device). ? Heart transplant. Follow these instructions at home: Medicines  Take over-the-counter and prescription medicines only as told by your health care provider.  Do not stop taking your medicines or change the amount you take. If you are having problems or side effects from your medicines, talk to your health care provider.  If you are having difficulty paying for your medicines, contact a social worker or your clinic. There are many programs to assist with medicine costs.  Talk to your health care provider before starting any new medicines or supplements.  Make sure your health care provider and pharmacist have a list of all the medicines you are taking. Eating and drinking   Avoid drinking alcohol.  Eat a heart-healthy diet as told by your health care provider. This includes: ? Plenty of fruits and vegetables. ? Lean proteins. ? Low-fat dairy. ? Whole grains. ? Foods that are low in sodium. Activity   Exercise regularly as told by your health care provider. Balance exercise with rest.  Ask your health care provider what activities are safe for you. This includes sexual activity, exercise, and daily tasks at home or work. Lifestyle  Do not use any products that contain nicotine or tobacco, such as cigarettes and e-cigarettes. If you need help quitting, ask your  health care provider.  Maintain a healthy weight. Ask your health care provider what weight is healthy for you.  Consider joining a patient support group. This can help with emotional problems you may have, such as stress and anxiety. General instructions  Talk to your health care provider about flu and pneumonia vaccines.  Keep a list of medicines that you are taking. This may help in emergency situations.  Keep all follow-up visits as told by your health care provider. This is important. Contact a health care provider if:  You have questions about your medicines or you miss a dose.  You feel anxious, depressed, or stressed.  You have swelling in your feet, ankles, legs, or abdomen.  You have shortness of breath during activity or exercise.  You have a cough.  You have a fever.  You have trouble sleeping.  You gain 2-3 lb (1-1.4 kg) in 24 hours or 5 lb (2.3 kg) in a week. Get help right away if:  You have chest pain.  You have shortness of breath while resting.  You have severe fatigue.  You are confused.  You have severe dizziness.  You have a rapid or irregular heartbeat.  You have nausea or you vomit.  You have a cough that is worse at night or you cannot lie flat.  You have a cough that will not go away.  You have severe depression or sadness. Summary  When heart failure symptoms get worse, it is called heart failure exacerbation.  Common causes of this condition include taking medicines incorrectly, infections, and drinking alcohol.  This condition may be treated by adjusting medicines, maintaining a healthy lifestyle, or surgery.  Do not stop taking your medicines or change the amount you take. If you are having problems or side effects from your medicines, talk to your health care provider. This information is not intended to replace advice given to you by your health care provider. Make sure you discuss any questions you have with your health care  provider. Document Released: 08/17/2016 Document Revised: 08/17/2016 Document Reviewed: 08/17/2016 Elsevier Interactive Patient Education  2019 [place].   Heart Failure Action Plan A heart failure action plan helps you understand what to do when you have symptoms of heart failure. Follow the plan that was created by you and your health care provider. Review your plan each time you visit your health care provider. Red zone These signs and symptoms mean you should get medical help right away:  You have trouble breathing when resting.  You have a dry cough that is getting worse.  You have swelling or pain in your legs or abdomen that is getting worse.  You suddenly gain more than 2-3 lb (0.9-1.4 kg) in a day, or more than 5 lb (2.3 kg) in one week. This amount may be more or less depending on your condition.  You have trouble staying awake or you feel confused.  You have chest pain.  You do not have an appetite.  You pass out. If you experience any of these symptoms:  Call your local emergency services (911 in the [place] right away or seek help at the emergency department of the nearest hospital. Yellow zone These signs and symptoms mean your condition may be getting worse and you should make some changes:  You have trouble breathing when you are active or you need to sleep with extra pillows.  You have swelling in your legs or abdomen.  You gain 2-3 lb (0.9-1.4 kg) in one day, or 5 lb (2.3 kg) in one week. This amount may be more or less depending on your condition.  You get tired easily.  You have trouble sleeping.  You have a dry cough. If you experience any of these symptoms:  Contact your health care provider within the next day.  Your health care provider may adjust your medicines. Green zone These signs mean you are doing well and can continue what you are doing:  You do not have shortness of breath.  You have very little swelling or no new  swelling.  Your weight is stable (no gain or loss).  You have a normal activity level.  You do not have chest pain or any other new symptoms. Follow these instructions at home:  Take over-the-counter and prescription medicines only as told by your health care provider.  Weigh  yourself daily. Your target weight is __________ lb (__________ kg). ? Call your health care provider if you gain more than __________ lb (__________ kg) in a day, or more than __________ lb (__________ kg) in one week.  Eat a heart-healthy diet. Work with a diet and nutrition specialist (dietitian) to create an eating plan that is best for you.  Keep all follow-up visits as told by your health care provider. This is important. Where to find more information  American Heart Association: www.heart.org Summary  Follow the action plan that was created by you and your health care provider.  Get help right away if you have any symptoms in the Red zone. This information is not intended to replace advice given to you by your health care provider. Make sure you discuss any questions you have with your health care provider. Document Released: 05/15/2016 Document Revised: 12/08/2016 Document Reviewed: 05/15/2016 Elsevier Interactive Patient Education  2019 [place].   Heart Failure Eating Plan Heart failure, also called congestive heart failure, occurs when your heart does not pump blood well enough to meet your body's needs for oxygen-rich blood. Heart failure is a long-term (chronic) condition. Living with heart failure can be challenging. However, following your health care provider's instructions about a healthy lifestyle and working with a diet and nutrition specialist (dietitian) to choose the right foods may help to improve your symptoms. What are tips for following this plan? General guidelines  Do not eat more than 2,300 mg of salt (sodium) a day. The amount of sodium that is recommended for you may be  lower, depending on your condition.  Maintain a healthy body weight as directed. Ask your health care provider what a healthy weight is for you. ? Check your weight every day. ? Work with your health care provider and dietitian to make a plan that is right for you to lose weight or maintain your current weight.  Limit how much fluid you drink. Ask your health care provider or dietitian how much fluid you can have each day.  Limit or avoid alcohol as told by your health care provider or dietitian. Reading food labels  Check food labels for the amount of sodium per serving. Choose foods that have less than 140 mg (milligrams) of sodium in each serving.  Check food labels for the number of calories per serving. This is important if you need to limit your daily calorie intake to lose weight.  Check food labels for the serving size. If you eat more than one serving, you will be eating more sodium and calories than what is listed on the label.  Look for foods that are labeled as "sodium-free," "very low sodium," or "low sodium." ? Foods labeled as "reduced sodium" or "lightly salted" may still have more sodium than what is recommended for you. Cooking  Avoid adding salt when cooking. Ask your health care provider or dietitian before using salt substitutes.  Season food with salt-free seasonings, spices, or herbs. Check the label of seasoning mixes to make sure they do not contain salt.  Cook with heart-healthy oils, such as olive, canola, soybean, or sunflower oil.  Do not fry foods. Cook foods using low-fat methods, such as baking, boiling, grilling, and broiling.  Limit unhealthy fats when cooking by: ? Removing the skin from poultry, such as chicken. ? Removing all visible fats from meats. ? Skimming the fat off from stews, soups, and gravies before serving them. Meal planning   Limit your intake of: ?  Processed, canned, or pre-packaged foods. ? Foods that are high in trans fat,  such as fried foods. ? Sweets, desserts, sugary drinks, and other foods with added sugar. ? Full-fat dairy products, such as whole milk.  Eat a balanced diet that includes: ? 4-5 servings of fruit each day and 4-5 servings of vegetables each day. At each meal, try to fill half of your plate with fruits and vegetables. ? Up to 6-8 servings of whole grains each day. ? Up to 2 servings of lean meat, poultry, or fish each day. One serving of meat is equal to 3 oz. This is about the same size as a deck of cards. ? 2 servings of low-fat dairy each day. ? Heart-healthy fats. Healthy fats called omega-3 fatty acids are found in foods such as flaxseed and cold-water fish like sardines, salmon, and mackerel.  Aim to eat 25-35 g (grams) of fiber a day. Foods that are high in fiber include apples, broccoli, carrots, beans, peas, and whole grains.  Do not add salt or condiments that contain salt (such as soy sauce) to foods before eating.  When eating at a restaurant, ask that your food be prepared with less salt or no salt, if possible.  Try to eat 2 or more vegetarian meals each week.  Eat more home-cooked food and eat less restaurant, buffet, and fast food. Recommended foods The items listed may not be a complete list. Talk with your dietitian about what dietary choices are best for you. Grains Bread with less than 80 mg of sodium per slice. Whole-wheat pasta, quinoa, and brown rice. Oats and oatmeal. Barley. [place]. Grits and cream of wheat. Whole-grain and whole-wheat cold cereal. Vegetables All fresh vegetables. Vegetables that are frozen without sauce or added salt. Low-sodium or sodium-free canned vegetables. Fruits All fresh, frozen, and canned fruits. Dried fruits, such as raisins, prunes, and cranberries. Meats and other protein foods Lean cuts of meat. Skinless chicken and [place]. Fish with high omega-3 fatty acids, such as salmon, sardines, and other cold-water fishes. Eggs. Dried beans,  peas, and edamame. Unsalted nuts and nut butters. Dairy Low-fat or nonfat (skim) milk and dried milk. Rice milk, soy milk, and almond milk. Low-fat or nonfat yogurt. Small amounts of reduced-sodium block cheese. Low-sodium cottage cheese. Fats and oils Olive, canola, soybean, flaxseed, or sunflower oil. Avocado. Sweets and desserts Apple sauce. Granola bars. Sugar-free pudding and gelatin. Frozen fruit bars. Seasoning and other foods Fresh and dried herbs. Lemon or lime juice. Vinegar. Low-sodium ketchup. Salt-free marinades, salad dressings, sauces, and seasonings. Foods to avoid The items listed may not be a complete list. Talk with your dietitian about what dietary choices are best for you. Grains Bread with more than 80 mg of sodium per slice. Hot or cold cereal with more than 140 mg sodium per serving. Salted pretzels and crackers. Pre-packaged breadcrumbs. Bagels, croissants, and biscuits. Vegetables Canned vegetables. Frozen vegetables with sauce or seasonings. Creamed vegetables. [place] fries. Onion rings. Pickled vegetables and sauerkraut. Fruits Fruits that are dried with sodium-containing preservatives. Meats and other protein foods Ribs and chicken wings. Bacon, ham, pepperoni, bologna, salami, and packaged luncheon meats. Hot dogs, bratwurst, and sausage. Canned meat. Smoked meat and fish. Salted nuts and seeds. Dairy Whole milk, half-and-half, and cream. Buttermilk. Processed cheese, cheese spreads, and cheese curds. Regular cottage cheese. Feta cheese. Shredded cheese. String cheese. Fats and oils Butter, lard, shortening, ghee, and bacon fat. Canned and packaged gravies. Seasoning and other foods Onion salt, garlic salt,  table salt, and sea salt. Marinades. Regular salad dressings. Relishes, pickles, and olives. Meat flavorings and tenderizers, and bouillon cubes. Horseradish, ketchup, and mustard. Worcestershire sauce. Teriyaki sauce, soy sauce (including reduced sodium). Hot  sauce and Tabasco sauce. Steak sauce, fish sauce, oyster sauce, and cocktail sauce. Taco seasonings. Barbecue sauce. [place]ar sauce. Summary  A heart failure eating plan includes changes that limit your intake of sodium and unhealthy fat, and it may help you lose weight or maintain a healthy weight. Your health care provider may also recommend limiting how much fluid you drink.  Most people with heart failure should eat no more than 2,300 mg of salt (sodium) a day. The amount of sodium that is recommended for you may be lower, depending on your condition.  Contact your health care provider or dietitian before making any major changes to your diet. This information is not intended to replace advice given to you by your health care provider. Make sure you discuss any questions you have with your health care provider. Document Released: 08/20/2016 Document Revised: 08/20/2016 Document Reviewed: 08/20/2016 Elsevier Interactive Patient Education  2019 [place].   Migraine Headache  A migraine headache is a very strong throbbing pain on one side or both sides of your head. Migraines can also cause other symptoms. Talk with your doctor about what things may bring on (trigger) your migraine headaches. Follow these instructions at home: Medicines  Take over-the-counter and prescription medicines only as told by your doctor.  Do not drive or use heavy machinery while taking prescription pain medicine.  To prevent or treat constipation while you are taking prescription pain medicine, your doctor may recommend that you: ? Drink enough fluid to keep your pee (urine) clear or pale yellow. ? Take over-the-counter or prescription medicines. ? Eat foods that are high in fiber. These include fresh fruits and vegetables, whole grains, and beans. ? Limit foods that are high in fat and processed sugars. These include fried and sweet foods. Lifestyle  Avoid alcohol.  Do not use any products that contain  nicotine or tobacco, such as cigarettes and e-cigarettes. If you need help quitting, ask your doctor.  Get at least 8 hours of sleep every night.  Limit your stress. General instructions   Keep a journal to find out what may bring on your migraines. For example, write down: ? What you eat and drink. ? How much sleep you get. ? Any change in what you eat or drink. ? Any change in your medicines.  If you have a migraine: ? Avoid things that make your symptoms worse, such as bright lights. ? It may help to lie down in a dark, quiet room. ? Do not drive or use heavy machinery. ? Ask your doctor what activities are safe for you.  Keep all follow-up visits as told by your doctor. This is important. Contact a doctor if:  You get a migraine that is different or worse than your usual migraines. Get help right away if:  Your migraine gets very bad.  You have a fever.  You have a stiff neck.  You have trouble seeing.  Your muscles feel weak or like you cannot control them.  You start to lose your balance a lot.  You start to have trouble walking.  You pass out (faint). This information is not intended to replace advice given to you by your health care provider. Make sure you discuss any questions you have with your health care provider. Document Released: 01/13/2008  Document Revised: 12/28/2017 Document Reviewed: 09/22/2015 Elsevier Interactive Patient Education  2019 [place] With Heart Failure  Heart failure is a long-term (chronic) condition in which the heart cannot pump enough blood through the body. When this happens, parts of the body do not get the blood and oxygen they need. There is no cure for heart failure at this time, so it is important for you to take good care of yourself and follow the treatment plan set by your health care provider. If you are living with heart failure, there are ways to help you manage the disease. Follow these instructions at  home: Living with heart failure requires you to make changes in your life. Your health care team will teach you about the changes you need to make in order to relieve your symptoms and lower your risk of going to the hospital. Follow the treatment plan as set by your health care provider. Medicines Medicines are important in reducing your heart's workload, slowing the progression of heart failure, and improving your symptoms.  Take over-the-counter and prescription medicines only as told by your health care provider.  Do not stop taking your medicine unless your health care provider tells you to do that.  Do not skip any dose of your medicine.  Refill prescriptions before you run out of medicine. You need your medicines every day. Eating and drinking   Eat heart-healthy foods. Talk with a dietitian to make an eating plan that is right for you. ? If directed by your health care provider: ? Limit salt (sodium). Lowering your sodium intake may reduce symptoms of heart failure. Ask a dietitian to recommend heart-healthy seasonings. ? Limit your fluid intake. Fluid restriction may reduce symptoms of heart failure. ? Use low-fat cooking methods instead of frying. Low-fat methods include roasting, grilling, broiling, baking, poaching, steaming, and stir-frying. ? Choose foods that contain no trans fat and are low in saturated fat and cholesterol. Healthy choices include fresh or frozen fruits and vegetables, fish, lean meats, legumes, fat-free or low-fat dairy products, and whole-grain or high-fiber foods.  Limit alcohol intake to no more than 1 drink a day for nonpregnant women and 2 drinks a day for men. One drink equals 12 oz of beer, 5 oz of wine, or 1 oz of hard liquor. ? Drinking more than that is harmful to your heart. Tell your health care provider if you drink alcohol several times a week. ? Talk with your health care provider about whether any level of alcohol use is safe for  you. Activity   Ask your health care provider about attending cardiac rehabilitation. These programs include aerobic physical activity, which provides many benefits for your heart.  If no cardiac rehabilitation program is available, ask your health care provider what aerobic exercises are safe for you to do. Lifestyle Make the lifestyle changes recommended by your health care provider. In general:  Lose weight if your health care provider tells you to do that. Weight loss may reduce symptoms of heart failure.  Do not use any products that contain nicotine or tobacco, such as cigarettes or e-cigarettes. If you need help quitting, ask your health care provider.  Do not use street (illegal) drugs.  Return to your normal activities as told by your health care provider. Ask your health care provider what activities are safe for you. General instructions   Make sure you weigh yourself every day to track your weight. Rapid weight gain may indicate an increase in  fluid in your body and may increase the workload of your heart. ? Weigh yourself every morning. Do this after you urinate but before you eat breakfast. ? Wear the same type of clothing, without shoes, each time you weigh yourself. ? Weigh yourself on the same scale and in the same spot each time.  Living with chronic heart failure often leads to emotions such as fear, stress, anxiety, and depression. If you feel any of these emotions and need help coping, contact your health care provider. Other ways to get help include: ? Talking to friends and family members about your condition. They can give you support and guidance. Explain your symptoms to them and, if comfortable, invite them to attend appointments or rehabilitation with you. ? Joining a support group for people with chronic heart failure. Talking with other people who have the same symptoms may give you new ways of coping with your disease and your emotions.  Stay up to date  with your shots (vaccines). Staying current on pneumococcal and influenza vaccines is especially important in preventing germs from attacking your airways (respiratory infections).  Keep all follow-up visits as told by your health care provider. This is important. How to recognize changes in your condition You and your family members need to know what changes to watch for in your condition. Watch for the following changes and report them to your health care provider:  Sudden weight gain. Ask your health care provider what amount of weight gain to report.  Shortness of breath: ? Feeling short of breath while at rest, with no exercise or activity that required great effort. ? Feeling breathless with activity.  Swelling of your lower legs or ankles.  Difficulty sleeping: ? You wake up feeling short of breath. ? You have to use more pillows to raise your head in order to sleep.  Frequent, dry, hacking cough.  Loss of appetite.  Feeling more tired all the time.  Depression or feelings of sadness or hopelessness.  Bloating in the stomach. Where to find more information  Local support groups. Ask your health care provider about groups near you.  The American Heart Association: www.heart.org Contact a health care provider if:  You have a rapid weight gain.  You have increasing shortness of breath that is unusual for you.  You are unable to participate in your usual physical activities.  You tire easily.  You cough more than normal, especially with physical activity.  You have any swelling or more swelling in areas such as your hands, feet, ankles, or abdomen.  You feel like your heart is beating quickly (palpitations).  You become dizzy or light-headed when you stand up. Get help right away if:  You have difficulty breathing.  You notice or your family notices a change in your awareness, such as having trouble staying awake or having difficulty with concentration.  You  have pain or discomfort in your chest.  You have an episode of fainting (syncope). Summary  There is no cure for heart failure, so it is important for you to take good care of yourself and follow the treatment plan set by your health care provider.  Medicines are important in reducing your heart's workload, slowing the progression of heart failure, and improving your symptoms.  Living with chronic heart failure often leads to emotions such as fear, stress, anxiety, and depression. If you are feeling any of these emotions and need help coping, contact your health care provider. This information is not intended to  replace advice given to you by your health care provider. Make sure you discuss any questions you have with your health care provider. Document Released: 08/18/2016 Document Revised: 08/18/2016 Document Reviewed: 08/18/2016 Elsevier Interactive Patient Education  2019 [employer].

## 2018-04-15 NOTE — Discharge Summary (Signed)
Discharge Summary  Kristina Summers OBS:[id number] DOB: 08-28-1935  PCP: [facility] [place] date: 04/10/2018 Discharge date: 04/15/2018  Time spent: 35 minutes  Recommendations for Outpatient Follow-up:  1. Follow-up with vascular surgery 2. Follow-up with your PCP 3. Follow-up with your cardiologist 4. Follow-up with podiatry after obtaining referral from your PCP 5. Take your medications as prescribed 6. Continue wound care 7. Continue physical therapy 8. Fall precautions  Discharge Diagnoses:  Active Hospital Problems   Diagnosis Date Noted  . CHF exacerbation ([place]) 04/10/2018  . Foot ulcer, right ([place]) 12/11/2017  . CKD (chronic kidney disease), stage III ([place]) 12/11/2017  . 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 Type II diabetes mellitus with renal manifestations ([place]) 06/10/2017  . Essential hypertension 06/10/2017  . Paroxysmal atrial fibrillation   . Hyperlipidemia   . Diabetes mellitus type 2 with peripheral artery disease ([place])   . History of GI bleed 04/21/2009  . S/P CABG (coronary artery bypass graft) 11/20/1998    Resolved Hospital Problems  No resolved problems to display.    Discharge Condition: Stable  Diet recommendation: Resume previous diet; heart healthy diabetic diet  Vitals:   04/15/18 0412 04/15/18 1155  BP: (!) 152/58 (!) 144/52  Pulse: (!) 57 (!) 49  Resp: 20 20  Temp: 97.6 F (36.4 C) 98 F (36.7 C)  SpO2: 96% 97%    History of present illness:  Kristina [name] a 82 y.o.femalewith past medical history UTML-4,YTKPTWS diastolic CHF, CKD 4, CAD status post CABG,AS status post TVAR in 10/2017,PVD with multiple stents,A. fib on Eliquis, CVA with speech abnormalityandGI bleed who [facility] to 12/5/19fordiastolic CHF exacerbation and discharged with increased dose of Lasix presents now with complaints of headache and exertional dyspnea.  Work-up in the ED revealed  elevated ESR at 127(although was 136 in November), BNP at 2800 (was 1200 previously) chest x-ray shows pulmonary edema. She is saturating well on room air and her blood pressure is elevated at systolic 568. Patient also noted to have foul-smelling wound on the right foot. She states she has not been able to afford seeing a podiatrist and has home wound care nurse visit her twice a week for wound care dressings. She denies any recent pustular drainage or bleeding.  04/15/2018: Patient seen and examined at bedside.  No acute events overnight.  She has no new complaints.  She denies temporal headache.  No dizziness.  Denies chest pain, palpitations, or dyspnea.  On the day of discharge, the patient was hemodynamically stable.  She will need to follow-up with her cardiologist, vascular surgery, PCP posthospitalization.  Also will need to continue wound care.      Hospital Course:  Active Problems:   Paroxysmal atrial fibrillation   Diabetes mellitus type 2 with peripheral artery disease (HCC)   Hyperlipidemia   S/P CABG (coronary artery bypass graft)   History of GI bleed   Essential hypertension   Type II diabetes mellitus with renal manifestations (HCC)   S/P TAVR (transcatheter aortic valve replacement)   CKD (chronic kidney disease), stage III (HCC)   Foot ulcer, right (HCC)   CHF exacerbation (HCC)  Acute on chronic diastolic CHF: Strict intake and outptut and daily weights. She currently denies any chest pain or sob.   Improving AKI on CKD 4 Baseline creatinine 1.6 with GFR of 29 Creatinine improving 1.68 from 1.91 from 2.03  Avoid nephrotoxic agents/hypotension Follow-up with PCP  Atrial fibrillation , permanent and rate controlled.  -  on eliquis for anti coagulation and BB for rate control.  H/o GI bleed.  S/p EGD and capsule study.  No obvious signs of bleeding this admission.   Temporal headache.  Vascular surgery consulted for biopsy of the temporal artery,  underwent on12/26, to evaluate for temporal arteritis.  Pathology unrevealing.  Symptoms improved. Continueprednisone 50 mg daily x2 weeks then taper to 40 mg x 2 weeks then taper again according to your provider Please follow-up with vascular surgery and your PCP post hospitalization  Hypertension:  Blood pressure is well controlled.   Bradycardia in the setting of A. fib Reduced dose of metoprolol to 25 mg twice daily from 50 mg twice daily Follow-up with your cardiologist within a week  Hyperlipidemia:  Resume statin.    Diabetes Mellitus:  insulin dependent.  CBG (last 3) RecentLabs(last2labs)       Recent Labs   04/13/18 0742 04/13/18 1130 04/13/18 1311  GLUCAP 299* 202* 202*     Continue 5units of novolog TIDAC, Increased HUMULIN to 12 at night and 18 in am.  Continue resistant SSI.   Right foot ulcer:  Chronic, will need podiatry follow up on discharge.  Wound care consulted and recommendations given.  X ray of the right foot shows Ulceration in the soft tissues adjacent to the fifth metatarsal phalangeal joint without evidence of osteomyelitis. Chronic needle-like foreign bodies adjacent to the first metatarsal phalangeal joint. Complete a course of doxycycline x 14 days for the right foot ulcer.  Ambulatory dysfunction/physical debility PT recommended home health PT Fall precautions   DVT prophylaxis:Eliquis.  Code Status:DNR  Family Communication:Son and daughter-in-law at bedside.   Consultants:  Vascular surgery.   Procedures:None.   Antimicrobials: Doxycycline.       Discharge Exam: BP (!) 144/52 (BP Location: Left Arm)   Pulse (!) 49   Temp 98 F (36.7 C) (Oral)   Resp 20   Ht '5\' 6"'$  (1.676 m)   Wt 81.4 kg   SpO2 97%   BMI 28.97 kg/m  . General: 82 y.o. year-old female well developed well nourished in no acute distress.  Alert and interactive. . Cardiovascular: Irregular rate and rhythm with no  rubs or gallops.  No thyromegaly or JVD noted.   Marland Kitchen Respiratory: Clear to auscultation with no wheezes or rales. Good inspiratory effort. . Abdomen: Soft nontender nondistended with normal bowel sounds x4 quadrants. . Musculoskeletal: No lower extremity edema. 2/4 pulses in all 4 extremities. Marland Kitchen Psychiatry: Mood is appropriate for condition and setting  Discharge Instructions You were cared for by a hospitalist during your hospital stay. If you have any questions about your discharge medications or the care you received while you were in the hospital after you are discharged, you can call the unit and asked to speak with the hospitalist on call if the hospitalist that took care of you is not available. Once you are discharged, your primary care physician will handle any further medical issues. Please note that NO REFILLS for any discharge medications will be authorized once you are discharged, as it is imperative that you return to your primary care physician (or establish a relationship with a primary care physician if you do not have one) for your aftercare needs so that they can reassess your need for medications and monitor your lab values.   Allergies as of 04/15/2018      Reactions   Morphine And Related Nausea And Vomiting, Other (See Comments)   Chest pain, also   Lisinopril  Cough   Zoloft [sertraline Hcl] Nausea Only         Medication List    STOP taking these medications   insulin regular 100 units/mL injection Commonly known as:  NOVOLIN R,HUMULIN R Replaced by:  insulin aspart 100 UNIT/ML injection     TAKE these medications   acetaminophen 500 MG tablet Commonly known as:  TYLENOL Take 500 mg by mouth daily as needed for moderate pain or headache.   amLODipine 10 MG tablet Commonly known as:  NORVASC Take 10 mg by mouth daily.   apixaban 2.5 MG Tabs tablet Commonly known as:  ELIQUIS Take 1 tablet (2.5 mg total) by mouth 2 (two) times daily. What changed:     medication strength  how much to take   atorvastatin 80 MG tablet Commonly known as:  LIPITOR Take 1 tablet (80 mg total) by mouth daily. What changed:    how much to take  when to take this   doxycycline 100 MG tablet Commonly known as:  VIBRA-TABS Take 1 tablet (100 mg total) by mouth daily for 10 days.   furosemide 40 MG tablet Commonly known as:  LASIX Take 1 tablet (40 mg total) by mouth daily. Start tomorrow 12/6 What changed:  when to take this   glucose 4 GM chewable tablet Chew 1-4 tablets by mouth once as needed for low blood sugar.   insulin aspart 100 UNIT/ML injection Commonly known as:  novoLOG Inject 8 Units into the skin 3 (three) times daily with meals. Replaces:  insulin regular 100 units/mL injection   insulin NPH Human 100 UNIT/ML injection Commonly known as:  HUMULIN N,NOVOLIN N Inject 0.12 mLs (12 Units total) into the skin daily before supper. What changed:  You were already taking a medication with the same name, and this prescription was added. Make sure you understand how and when to take each.   insulin NPH Human 100 UNIT/ML injection Commonly known as:  HUMULIN N,NOVOLIN N Inject 0.18 mLs (18 Units total) into the skin daily before breakfast. Start taking on:  April 16, 2018 What changed:    how much to take  when to take this  additional instructions   metoprolol tartrate 25 MG tablet Commonly known as:  LOPRESSOR Take 1 tablet (25 mg total) by mouth 2 (two) times daily.   multivitamin with minerals Tabs tablet Take 1 tablet by mouth daily. One a Day 50 plus   nitroGLYCERIN 0.4 MG SL tablet Commonly known as:  NITROSTAT Place 0.4 mg under the tongue every 5 (five) minutes as needed for chest pain.   nutrition supplement (JUVEN) Pack Take 1 packet by mouth 2 (two) times daily between meals.   pantoprazole 40 MG tablet Commonly known as:  PROTONIX Take 1 tablet (40 mg total) by mouth daily for 30 doses.   predniSONE 50  MG tablet Commonly known as:  DELTASONE Take 1 tablet 50 mg daily x2 weeks Then taper down to 40 mg daily x2 weeks Then taper again according to your provider Please follow-up with your provider/PCP within a week Start taking on:  April 16, 2018      Allergies  Allergen Reactions  . Morphine And Related Nausea And Vomiting and Other (See Comments)    Chest pain, also   . Lisinopril Cough  . Zoloft [Sertraline Hcl] Nausea Only        Follow-up Information    [facility].   Specialty:  Vascular Surgery Why:  As  needed Contact information: [address] [place], [facility]. Call in 1 day.   Specialty:  [place] Why:  Please follow-up within a week Contact information: [place] [address] [phone]            The results of significant diagnostics from this hospitalization (including imaging, microbiology, ancillary and laboratory) are listed below for reference.    Significant Diagnostic Studies: Dg Chest 2 View  Result Date: 04/10/2018 CLINICAL DATA:  Acute shortness of breath and chest pain EXAM: CHEST - 2 VIEW COMPARISON:  03/20/2018 FINDINGS: Artifact overlies the chest. Previous median sternotomy and CABG. Previous aortic valve replacement. Cardiomegaly. Pulmonary venous hypertension with interstitial edema. Small effusions in the posterior costophrenic angles. Mild dependent pulmonary atelectasis. IMPRESSION: Congestive heart failure with interstitial edema and small effusions. Electronically Signed   By: Nelson Chimes M.D.   On: 04/10/2018 12:29   Ct Head Wo Contrast  Result Date: 04/10/2018 CLINICAL DATA:  Severe headache. EXAM: CT HEAD WITHOUT CONTRAST TECHNIQUE: Contiguous axial images were obtained from the base of the skull through the vertex without intravenous contrast. COMPARISON:  CT head dated December 11, 2017. FINDINGS: Brain: No  evidence of acute infarction, hemorrhage, hydrocephalus, extra-axial collection or mass lesion/mass effect. Unchanged left frontal encephalomalacia. Stable mild atrophy and chronic microvascular ischemic changes. Vascular: Calcified atherosclerosis at the skullbase. No hyperdense vessel. Skull: Negative for fracture or focal abnormality. Sinuses/Orbits: No acute finding. Unchanged partial opacification of the left mastoid air cells. Other: None. IMPRESSION: 1.  No acute intracranial abnormality. 2. Stable mild atrophy and chronic microvascular ischemic changes. Unchanged old left frontal infarct. Electronically Signed   By: Titus Dubin M.D.   On: 04/10/2018 14:59   Dg Bone Density (dxa)  Result Date: 03/28/2018 EXAM: DUAL X-RAY ABSORPTIOMETRY (DXA) FOR BONE MINERAL DENSITY IMPRESSION: Referring Physician:  Cornell Your patient completed a BMD test using Lunar IDXA DXA system ( analysis version: 16 ) manufactured by [employer]. Technologist: KT PATIENT: Name: Kaveri, Perras Patient ID: [id number] Birth Date: 1935/08/27 Height: 64.5 in. Sex: Female Measured: 03/28/2018 Weight: 182.0 lbs. Indications: Advanced Age, Caucasian, Estrogen Deficient, Height Loss (781.91), History of Fracture (Adult) (V15.51), Postmenopausal, Rheumatoid Arthritis (714.0) Fractures: Fibula, Foot, Humerus Treatments: None ASSESSMENT: The BMD measured at Femur Neck Right is 0.852 g/cm2 with a T-score of -1.3. This patient is considered OSTEOPENIC according to [place] [facility]) criteria. The scan quality is good. L-3 and L-4 were excluded due to degenerative changes. Site Region Measured Date Measured Age YA T-score BMD Significant CHANGE DualFemur Neck Right 03/28/2018    80.5         -1.3    0.852 g/cm2 AP Spine  L1-L2      5/28/2018    80.5         0.6     1.234 g/cm2 DualFemur Total Mean 03/28/2018    80.5         -0.9    0.889 g/cm2 World Health Organization [facility]) criteria for post-menopausal,  Caucasian Women: Normal       T-score at or above -1 SD Osteopenia   T-score between -1 and -2.5 SD Osteoporosis T-score at or below -2.5 SD RECOMMENDATION: 1. All patients should optimize calcium and vitamin D intake. 2. Consider FDA approved medical therapies in postmenopausal women and men aged 74 years and older, based on the following: a. A hip or vertebral (clinical  or morphometric) fracture b. T- score < or = -2.5 at the femoral neck or spine after appropriate evaluation to exclude secondary causes c. Low bone mass (T-score between -1.0 and -2.5 at the femoral neck or spine) and a 10 year probability of a hip fracture > or = 3% or a 10 year probability of a major osteoporosis-related fracture > or = 20% based on the [place]-adapted WHO algorithm d. Clinician judgment and/or patient preferences may indicate treatment for people with 10-year fracture probabilities above or below these levels FOLLOW-UP: People with diagnosed cases of osteoporosis or at high risk for fracture should have regular bone mineral density tests. For patients eligible for Medicare, routine testing is allowed once every 2 years. The testing frequency can be increased to one year for patients who have rapidly progressing disease, those who are receiving or discontinuing medical therapy to restore bone mass, or have additional risk factors. I have reviewed this report and agree with the above findings. Mark A. Thornton Papas, M.D. [facility] FRAX* 10-year Probability of Fracture Based on femoral neck BMD: DualFemur (Right) Major Osteoporotic Fracture: 23.0% Hip Fracture:                5.1% Population:                  [place] (Caucasian) Risk Factors: History of Fracture (Adult) (V15.51), Rheumatoid Arthritis (714.0) *FRAX is a Materials engineer of the [employer] for Metabolic Bone Disease, a [place] (WHO) [facility]. ASSESSMENT: The probability of a major osteoporotic fracture is 23.0 %  within the next ten years. The probability of hip fracture is  5.1  % within the next 10 years. I have reviewed this report and agree with the above findings. Mark A. Thornton Papas, M.D. [facility] Electronically Signed   By: Lavonia Dana M.D.   On: 03/28/2018 15:54   Dg Chest Port 1 View  Result Date: 03/20/2018 CLINICAL DATA:  Shortness of breath. EXAM: PORTABLE CHEST 1 VIEW COMPARISON:  Chest x-ray dated March 13, 2018. FINDINGS: Stable cardiomegaly status post TAVR and CABG. The lungs remain hyperinflated with chronic interstitial changes. Progressive diffuse interstitial thickening throughout both lungs. No focal consolidation, pneumothorax, or large pleural effusion. No acute osseous abnormality. IMPRESSION: Worsening pulmonary interstitial edema. Electronically Signed   By: Titus Dubin M.D.   On: 03/20/2018 17:58   Dg Foot Complete Right  Result Date: 04/11/2018 CLINICAL DATA:  Diabetic ulcer RIGHT foot EXAM: RIGHT FOOT COMPLETE - 3+ VIEW COMPARISON:  03/13/2018 FINDINGS: Soft tissue ulceration in the lateral aspect of the forefoot adjacent to the fifth metatarsal phalangeal joint. No evidence of osseous erosion to suggest osteomyelitis. Two needle tips are in the soft tissues adjacent to the first metatarsal phalangeal joint. No change from prior. IMPRESSION: 1. Ulceration in the soft tissues adjacent to the fifth metatarsal phalangeal joint. 2. No evidence of osteomyelitis. 3. Chronic needle-like foreign bodies adjacent to the first metatarsal phalangeal joint. Electronically Signed   By: Suzy Bouchard M.D.   On: 04/11/2018 16:45    Microbiology: Recent Results (from the past 240 hour(s))  Surgical pcr screen     Status: Abnormal   Collection Time: 04/13/18 12:34 AM  Result Value Ref Range Status   MRSA, PCR POSITIVE (A) NEGATIVE Final    Comment: RESULT CALLED TO, READ BACK BY AND VERIFIED WITH: H HAMLIN RN 04/13/18 0318 JDW    Staphylococcus aureus POSITIVE (A) NEGATIVE  Final    Comment: (  NOTE) The Xpert SA Assay (FDA approved for NASAL specimens in patients 23 years of age and older), is one component of a comprehensive surveillance program. It is not intended to diagnose infection nor to guide or monitor treatment. Performed at [facility], [place] [address] [place], [place] [address]      Labs: Basic Metabolic Panel: Recent Labs  Lab 04/11/18 [phone] 04/12/18 1448 04/13/18 0919 04/14/18 0850 04/15/18 0405  NA 137 131* 133* 134* 136  K 4.6 4.2 4.9 4.3 4.7  CL 106 101 103 105 106  CO2 21* '23 23 22 '$ 19*  GLUCOSE 328* 237* 320* 256* 109*  BUN 34* 59* 68* 71* 73*  CREATININE 1.85* 1.97* 2.03* 1.91* 1.68*  CALCIUM 8.8* 8.7* 9.0 8.9 8.7*   Liver Function Tests: No results for input(s): AST, ALT, ALKPHOS, BILITOT, PROT, ALBUMIN in the last 168 hours. No results for input(s): LIPASE, AMYLASE in the last 168 hours. No results for input(s): AMMONIA in the last 168 hours. CBC: Recent Labs  Lab 04/10/18 1147 04/11/18 0713 04/12/18 1448 04/14/18 0850 04/15/18 0405  WBC 8.2 8.5 12.1* 10.1 9.2  NEUTROABS 6.4  --   --  9.6* 8.0*  HGB 8.8* 8.7* 8.8* 9.2* 8.6*  HCT 29.4* 28.5* 28.4* 30.5* 27.5*  MCV 86.7 85.8 86.1 87.6 86.8  PLT 296 255 280 230 211   Cardiac Enzymes: Recent Labs  Lab 04/10/18 1147  TROPONINI 0.03*   BNP: BNP (last 3 results) Recent Labs    03/13/18 1110 03/20/18 1721 04/10/18 1147  BNP 2,601.1* 1,119.8* 2,824.2*    ProBNP (last 3 results) No results for input(s): PROBNP in the last 8760 hours.  CBG: Recent Labs  Lab 04/14/18 1236 04/14/18 1645 04/14/18 2153 04/15/18 0735 04/15/18 1152  GLUCAP 261* 232* 205* 120* 204*       Signed:  Kayleen Memos, MD [facility] 04/15/2018, 2:59 PM

## 2018-04-15 NOTE — Progress Notes (Signed)
Patient ready for discharge from unit to home. Daughter will provided transportation. All personal belongings with patient. All discharge instructions reviewed with patient. Patient demonstrates no s/sx of distress or discomfort.

## 2018-04-15 NOTE — Progress Notes (Signed)
Notified [facility] that patient will DC today

## 2018-04-15 NOTE — Plan of Care (Signed)
  Problem: Activity: Goal: Capacity to carry out activities will improve Outcome: Progressing   Problem: Cardiac: Goal: Ability to achieve and maintain adequate cardiopulmonary perfusion will improve Outcome: Progressing   

## 2018-04-17 ENCOUNTER — Ambulatory Visit (HOSPITAL_COMMUNITY): Payer: Medicare Other

## 2018-04-18 ENCOUNTER — Inpatient Hospital Stay (HOSPITAL_COMMUNITY): Payer: Medicare Other

## 2018-04-18 ENCOUNTER — Inpatient Hospital Stay (HOSPITAL_COMMUNITY)
Admission: EM | Admit: 2018-04-18 | Discharge: 2018-04-21 | DRG: 065 | Disposition: A | Discharge status: Skilled Nursing Facility | Payer: Medicare Other | Source: Skilled Nursing Facility | Attending: Internal Medicine | Admitting: Internal Medicine

## 2018-04-18 ENCOUNTER — Emergency Department (HOSPITAL_COMMUNITY): Payer: Medicare Other

## 2018-04-18 ENCOUNTER — Encounter (HOSPITAL_COMMUNITY): Payer: Self-pay | Admitting: Emergency Medicine

## 2018-04-18 DIAGNOSIS — Z79899 Other long term (current) drug therapy: Secondary | ICD-10-CM | POA: Diagnosis not present

## 2018-04-18 DIAGNOSIS — M316 Other giant cell arteritis: Secondary | ICD-10-CM | POA: Diagnosis not present

## 2018-04-18 DIAGNOSIS — E1165 Type 2 diabetes mellitus with hyperglycemia: Secondary | ICD-10-CM | POA: Diagnosis present

## 2018-04-18 DIAGNOSIS — Z87891 Personal history of nicotine dependence: Secondary | ICD-10-CM

## 2018-04-18 DIAGNOSIS — R4182 Altered mental status, unspecified: Secondary | ICD-10-CM | POA: Diagnosis not present

## 2018-04-18 DIAGNOSIS — R29713 NIHSS score 13: Secondary | ICD-10-CM | POA: Diagnosis present

## 2018-04-18 DIAGNOSIS — L97513 Non-pressure chronic ulcer of other part of right foot with necrosis of muscle: Secondary | ICD-10-CM | POA: Diagnosis not present

## 2018-04-18 DIAGNOSIS — M797 Fibromyalgia: Secondary | ICD-10-CM | POA: Diagnosis present

## 2018-04-18 DIAGNOSIS — I639 Cerebral infarction, unspecified: Secondary | ICD-10-CM

## 2018-04-18 DIAGNOSIS — Z952 Presence of prosthetic heart valve: Secondary | ICD-10-CM | POA: Diagnosis not present

## 2018-04-18 DIAGNOSIS — E11621 Type 2 diabetes mellitus with foot ulcer: Secondary | ICD-10-CM | POA: Diagnosis present

## 2018-04-18 DIAGNOSIS — G8191 Hemiplegia, unspecified affecting right dominant side: Secondary | ICD-10-CM | POA: Diagnosis present

## 2018-04-18 DIAGNOSIS — Z951 Presence of aortocoronary bypass graft: Secondary | ICD-10-CM | POA: Diagnosis not present

## 2018-04-18 DIAGNOSIS — Z833 Family history of diabetes mellitus: Secondary | ICD-10-CM

## 2018-04-18 DIAGNOSIS — Z953 Presence of xenogenic heart valve: Secondary | ICD-10-CM

## 2018-04-18 DIAGNOSIS — I5042 Chronic combined systolic (congestive) and diastolic (congestive) heart failure: Secondary | ICD-10-CM | POA: Diagnosis present

## 2018-04-18 DIAGNOSIS — L97529 Non-pressure chronic ulcer of other part of left foot with unspecified severity: Secondary | ICD-10-CM | POA: Diagnosis present

## 2018-04-18 DIAGNOSIS — I2510 Atherosclerotic heart disease of native coronary artery without angina pectoris: Secondary | ICD-10-CM | POA: Diagnosis present

## 2018-04-18 DIAGNOSIS — D62 Acute posthemorrhagic anemia: Secondary | ICD-10-CM | POA: Diagnosis present

## 2018-04-18 DIAGNOSIS — E785 Hyperlipidemia, unspecified: Secondary | ICD-10-CM | POA: Diagnosis present

## 2018-04-18 DIAGNOSIS — R7309 Other abnormal glucose: Secondary | ICD-10-CM | POA: Diagnosis not present

## 2018-04-18 DIAGNOSIS — I63312 Cerebral infarction due to thrombosis of left middle cerebral artery: Secondary | ICD-10-CM

## 2018-04-18 DIAGNOSIS — Z66 Do not resuscitate: Secondary | ICD-10-CM | POA: Diagnosis present

## 2018-04-18 DIAGNOSIS — Z7901 Long term (current) use of anticoagulants: Secondary | ICD-10-CM

## 2018-04-18 DIAGNOSIS — L03115 Cellulitis of right lower limb: Secondary | ICD-10-CM | POA: Diagnosis present

## 2018-04-18 DIAGNOSIS — D72829 Elevated white blood cell count, unspecified: Secondary | ICD-10-CM | POA: Diagnosis not present

## 2018-04-18 DIAGNOSIS — I340 Nonrheumatic mitral (valve) insufficiency: Secondary | ICD-10-CM

## 2018-04-18 DIAGNOSIS — R29810 Facial weakness: Secondary | ICD-10-CM | POA: Diagnosis present

## 2018-04-18 DIAGNOSIS — I63512 Cerebral infarction due to unspecified occlusion or stenosis of left middle cerebral artery: Secondary | ICD-10-CM | POA: Diagnosis not present

## 2018-04-18 DIAGNOSIS — I361 Nonrheumatic tricuspid (valve) insufficiency: Secondary | ICD-10-CM

## 2018-04-18 DIAGNOSIS — R4701 Aphasia: Secondary | ICD-10-CM | POA: Diagnosis present

## 2018-04-18 DIAGNOSIS — E669 Obesity, unspecified: Secondary | ICD-10-CM | POA: Diagnosis present

## 2018-04-18 DIAGNOSIS — L97519 Non-pressure chronic ulcer of other part of right foot with unspecified severity: Secondary | ICD-10-CM | POA: Diagnosis present

## 2018-04-18 DIAGNOSIS — I63412 Cerebral infarction due to embolism of left middle cerebral artery: Secondary | ICD-10-CM | POA: Diagnosis not present

## 2018-04-18 DIAGNOSIS — I130 Hypertensive heart and chronic kidney disease with heart failure and stage 1 through stage 4 chronic kidney disease, or unspecified chronic kidney disease: Secondary | ICD-10-CM | POA: Diagnosis present

## 2018-04-18 DIAGNOSIS — D899 Disorder involving the immune mechanism, unspecified: Secondary | ICD-10-CM | POA: Diagnosis present

## 2018-04-18 DIAGNOSIS — I480 Paroxysmal atrial fibrillation: Secondary | ICD-10-CM | POA: Diagnosis present

## 2018-04-18 DIAGNOSIS — I482 Chronic atrial fibrillation, unspecified: Secondary | ICD-10-CM | POA: Diagnosis present

## 2018-04-18 DIAGNOSIS — N184 Chronic kidney disease, stage 4 (severe): Secondary | ICD-10-CM | POA: Diagnosis present

## 2018-04-18 DIAGNOSIS — I10 Essential (primary) hypertension: Secondary | ICD-10-CM | POA: Diagnosis not present

## 2018-04-18 DIAGNOSIS — Z6829 Body mass index (BMI) 29.0-29.9, adult: Secondary | ICD-10-CM

## 2018-04-18 DIAGNOSIS — Z794 Long term (current) use of insulin: Secondary | ICD-10-CM

## 2018-04-18 DIAGNOSIS — I4821 Permanent atrial fibrillation: Secondary | ICD-10-CM | POA: Diagnosis not present

## 2018-04-18 DIAGNOSIS — N183 Chronic kidney disease, stage 3 (moderate): Secondary | ICD-10-CM | POA: Diagnosis not present

## 2018-04-18 DIAGNOSIS — E1151 Type 2 diabetes mellitus with diabetic peripheral angiopathy without gangrene: Secondary | ICD-10-CM | POA: Diagnosis present

## 2018-04-18 DIAGNOSIS — H548 Legal blindness, as defined in USA: Secondary | ICD-10-CM | POA: Diagnosis present

## 2018-04-18 DIAGNOSIS — E1122 Type 2 diabetes mellitus with diabetic chronic kidney disease: Secondary | ICD-10-CM | POA: Diagnosis present

## 2018-04-18 LAB — CBC
HEMATOCRIT: 28.4 % — AB (ref 36.0–46.0)
Hemoglobin: 8.7 g/dL — ABNORMAL LOW (ref 12.0–15.0)
MCH: 26.3 pg (ref 26.0–34.0)
MCHC: 30.6 g/dL (ref 30.0–36.0)
MCV: 85.8 fL (ref 80.0–100.0)
NRBC: 0 % (ref 0.0–0.2)
Platelets: 163 10*3/uL (ref 150–400)
RBC: 3.31 MIL/uL — ABNORMAL LOW (ref 3.87–5.11)
RDW: 19.5 % — ABNORMAL HIGH (ref 11.5–15.5)
WBC: 15.9 10*3/uL — ABNORMAL HIGH (ref 4.0–10.5)

## 2018-04-18 LAB — I-STAT CHEM 8, ED
BUN: 56 mg/dL — ABNORMAL HIGH (ref 8–23)
CALCIUM ION: 1.18 mmol/L (ref 1.15–1.40)
Chloride: 102 mmol/L (ref 98–111)
Creatinine, Ser: 1.5 mg/dL — ABNORMAL HIGH (ref 0.44–1.00)
Glucose, Bld: 413 mg/dL — ABNORMAL HIGH (ref 70–99)
HCT: 29 % — ABNORMAL LOW (ref 36.0–46.0)
Hemoglobin: 9.9 g/dL — ABNORMAL LOW (ref 12.0–15.0)
Potassium: 4 mmol/L (ref 3.5–5.1)
Sodium: 133 mmol/L — ABNORMAL LOW (ref 135–145)
TCO2: 21 mmol/L — ABNORMAL LOW (ref 22–32)

## 2018-04-18 LAB — DIFFERENTIAL
Abs Immature Granulocytes: 0.07 10*3/uL (ref 0.00–0.07)
Basophils Absolute: 0 10*3/uL (ref 0.0–0.1)
Basophils Relative: 0 %
Eosinophils Absolute: 0 10*3/uL (ref 0.0–0.5)
Eosinophils Relative: 0 %
Immature Granulocytes: 0 %
LYMPHS PCT: 5 %
Lymphs Abs: 0.9 10*3/uL (ref 0.7–4.0)
Monocytes Absolute: 0.9 10*3/uL (ref 0.1–1.0)
Monocytes Relative: 6 %
Neutro Abs: 14 10*3/uL — ABNORMAL HIGH (ref 1.7–7.7)
Neutrophils Relative %: 89 %

## 2018-04-18 LAB — COMPREHENSIVE METABOLIC PANEL
ALBUMIN: 2.1 g/dL — AB (ref 3.5–5.0)
ALK PHOS: 104 U/L (ref 38–126)
ALT: 41 U/L (ref 0–44)
ANION GAP: 10 (ref 5–15)
AST: 43 U/L — ABNORMAL HIGH (ref 15–41)
BUN: 62 mg/dL — ABNORMAL HIGH (ref 8–23)
CO2: 20 mmol/L — ABNORMAL LOW (ref 22–32)
Calcium: 8.6 mg/dL — ABNORMAL LOW (ref 8.9–10.3)
Chloride: 102 mmol/L (ref 98–111)
Creatinine, Ser: 1.64 mg/dL — ABNORMAL HIGH (ref 0.44–1.00)
GFR calc Af Amer: 34 mL/min — ABNORMAL LOW (ref >60)
GFR calc non Af Amer: 29 mL/min — ABNORMAL LOW (ref >60)
GLUCOSE: 397 mg/dL — AB (ref 70–99)
Potassium: 4.1 mmol/L (ref 3.5–5.1)
SODIUM: 132 mmol/L — AB (ref 135–145)
Total Bilirubin: 0.9 mg/dL (ref 0.3–1.2)
Total Protein: 7.3 g/dL (ref 6.5–8.1)

## 2018-04-18 LAB — CBG MONITORING, ED
Glucose-Capillary: 376 mg/dL — ABNORMAL HIGH (ref 70–99)
Glucose-Capillary: 275 mg/dL — ABNORMAL HIGH (ref 70–99)

## 2018-04-18 LAB — ETHANOL: Alcohol, Ethyl (B): <10 mg/dL (ref <10)

## 2018-04-18 LAB — PREALBUMIN: PREALBUMIN: 11.6 mg/dL — AB (ref 18–38)

## 2018-04-18 LAB — C-REACTIVE PROTEIN: CRP: 4.6 mg/dL — ABNORMAL HIGH (ref <1.0)

## 2018-04-18 LAB — GLUCOSE, CAPILLARY
Glucose-Capillary: 296 mg/dL — ABNORMAL HIGH (ref 70–99)
Glucose-Capillary: 302 mg/dL — ABNORMAL HIGH (ref 70–99)
Glucose-Capillary: 218 mg/dL — ABNORMAL HIGH (ref 70–99)

## 2018-04-18 LAB — PROTIME-INR
INR: 1.14
PROTHROMBIN TIME: 14.5 s (ref 11.4–15.2)

## 2018-04-18 LAB — I-STAT TROPONIN, ED: Troponin i, poc: 0.04 ng/mL (ref 0.00–0.08)

## 2018-04-18 LAB — APTT: aPTT: 28 seconds (ref 24–36)

## 2018-04-18 LAB — ECHOCARDIOGRAM COMPLETE

## 2018-04-18 LAB — SEDIMENTATION RATE: Sed Rate: 95 mm/hr — ABNORMAL HIGH (ref 0–22)

## 2018-04-18 MED ORDER — ACETAMINOPHEN 160 MG/5ML PO SOLN: 650.0000 mg | ORAL | Status: DC | PRN

## 2018-04-18 MED ORDER — ASPIRIN 300 MG RE SUPP: 300.0000 mg | Freq: Every day | RECTAL | Status: DC

## 2018-04-18 MED ORDER — INSULIN NPH (HUMAN) (ISOPHANE) 100 UNIT/ML ~~LOC~~ SUSP
15.0000 [IU] | Freq: Every day | SUBCUTANEOUS | Status: DC
  Administered 2018-04-20 – 2018-04-21 (×2): 15 [IU] via SUBCUTANEOUS
  Filled 2018-04-18: qty 10

## 2018-04-18 MED ORDER — IOPAMIDOL (ISOVUE-370) INJECTION 76%
100.0000 mL | Freq: Once | INTRAVENOUS | Status: AC | PRN
  Administered 2018-04-18: 100 mL via INTRAVENOUS

## 2018-04-18 MED ORDER — ASPIRIN 325 MG PO TABS
325.0000 mg | ORAL_TABLET | Freq: Once | ORAL | Status: AC
  Administered 2018-04-18: 325 mg via ORAL
  Filled 2018-04-18: qty 1

## 2018-04-18 MED ORDER — SODIUM CHLORIDE 0.9 % IV SOLN
2.0000 g | INTRAVENOUS | Status: DC
  Administered 2018-04-18 – 2018-04-20 (×3): 2 g via INTRAVENOUS
  Filled 2018-04-18 (×4): qty 20

## 2018-04-18 MED ORDER — METRONIDAZOLE IN NACL 5-0.79 MG/ML-% IV SOLN
500.0000 mg | Freq: Three times a day (TID) | INTRAVENOUS | Status: DC
  Administered 2018-04-18 – 2018-04-20 (×6): 500 mg via INTRAVENOUS
  Filled 2018-04-18 (×6): qty 100

## 2018-04-18 MED ORDER — SENNA 8.6 MG PO TABS: 1.0000 | ORAL_TABLET | Freq: Every evening | ORAL | Status: DC | PRN

## 2018-04-18 MED ORDER — PANTOPRAZOLE SODIUM 40 MG PO TBEC
40.0000 mg | DELAYED_RELEASE_TABLET | Freq: Every day | ORAL | Status: DC
  Administered 2018-04-18 – 2018-04-21 (×4): 40 mg via ORAL
  Filled 2018-04-18 (×4): qty 1

## 2018-04-18 MED ORDER — ACETAMINOPHEN 325 MG PO TABS
650.0000 mg | ORAL_TABLET | ORAL | Status: DC | PRN
  Administered 2018-04-19 – 2018-04-21 (×4): 650 mg via ORAL
  Filled 2018-04-18 (×4): qty 2

## 2018-04-18 MED ORDER — ASPIRIN 325 MG PO TABS
325.0000 mg | ORAL_TABLET | Freq: Every day | ORAL | Status: DC
  Administered 2018-04-19: 325 mg via ORAL
  Filled 2018-04-18: qty 1

## 2018-04-18 MED ORDER — ENOXAPARIN SODIUM 40 MG/0.4ML ~~LOC~~ SOLN
40.0000 mg | SUBCUTANEOUS | Status: DC
  Administered 2018-04-18: 40 mg via SUBCUTANEOUS
  Filled 2018-04-18: qty 0.4

## 2018-04-18 MED ORDER — INSULIN ASPART 100 UNIT/ML ~~LOC~~ SOLN: 10.0000 [IU] | Freq: Once | SUBCUTANEOUS | Status: DC

## 2018-04-18 MED ORDER — ACETAMINOPHEN 650 MG RE SUPP: 650.0000 mg | RECTAL | Status: DC | PRN

## 2018-04-18 MED ORDER — INSULIN ASPART 100 UNIT/ML ~~LOC~~ SOLN
4.0000 [IU] | Freq: Three times a day (TID) | SUBCUTANEOUS | Status: DC
  Administered 2018-04-18 – 2018-04-21 (×7): 4 [IU] via SUBCUTANEOUS

## 2018-04-18 MED ORDER — INSULIN ASPART 100 UNIT/ML ~~LOC~~ SOLN
0.0000 [IU] | Freq: Every day | SUBCUTANEOUS | Status: DC
  Administered 2018-04-18: 2 [IU] via SUBCUTANEOUS
  Administered 2018-04-20: 3 [IU] via SUBCUTANEOUS

## 2018-04-18 MED ORDER — SODIUM CHLORIDE 0.9 % IV SOLN
INTRAVENOUS | Status: DC
  Administered 2018-04-18: 16:00:00 via INTRAVENOUS

## 2018-04-18 MED ORDER — ATORVASTATIN CALCIUM 80 MG PO TABS
80.0000 mg | ORAL_TABLET | Freq: Every day | ORAL | Status: DC
  Administered 2018-04-18 – 2018-04-20 (×3): 80 mg via ORAL
  Filled 2018-04-18 (×3): qty 1

## 2018-04-18 MED ORDER — INSULIN ASPART 100 UNIT/ML ~~LOC~~ SOLN
0.0000 [IU] | Freq: Three times a day (TID) | SUBCUTANEOUS | Status: DC
  Administered 2018-04-18: 8 [IU] via SUBCUTANEOUS
  Administered 2018-04-19 (×2): 3 [IU] via SUBCUTANEOUS
  Administered 2018-04-20: 8 [IU] via SUBCUTANEOUS
  Administered 2018-04-20: 2 [IU] via SUBCUTANEOUS
  Administered 2018-04-21: 15 [IU] via SUBCUTANEOUS
  Administered 2018-04-21: 11 [IU] via SUBCUTANEOUS

## 2018-04-18 MED ORDER — STROKE: EARLY STAGES OF RECOVERY BOOK
Freq: Once | Status: AC
  Administered 2018-04-18: 16:00:00
  Filled 2018-04-18: qty 1

## 2018-04-18 MED ORDER — INSULIN NPH (HUMAN) (ISOPHANE) 100 UNIT/ML ~~LOC~~ SUSP
12.0000 [IU] | Freq: Every day | SUBCUTANEOUS | Status: DC
  Administered 2018-04-18 – 2018-04-20 (×3): 12 [IU] via SUBCUTANEOUS

## 2018-04-18 MED ORDER — SENNA 8.6 MG PO TABS: 1.0000 | ORAL_TABLET | ORAL | Status: DC | PRN

## 2018-04-18 NOTE — ED Triage Notes (Signed)
Pt from home- lives alone and son checked on her yest 12/30 at 1730 and pt at baseline. He checked on her this morning and was aphasic, gait difficulties. On eliquis

## 2018-04-18 NOTE — Progress Notes (Signed)
Carotid duplex has been completed.   Preliminary results in CV Proc.   Kristina Summers 04/18/2018 2:12 PM

## 2018-04-18 NOTE — ED Notes (Signed)
Report given to floor nurse; no questions.

## 2018-04-18 NOTE — Progress Notes (Signed)
Pt admitted to 3W16 at this time.  Alert and oriented, and follows commands.  Denies any pain.  Telemetry placed on patient and CCMD called and verified. Severe dysarthria and aphasia.  Pt has history of blindness.  States "I can see your face, but not your eyes."  Right foot has an ulcer, dressed from ER and redness to lower leg distally which is marked.  She denies pain to the area.  VSS. Bed in lowest position, bed alarm set, call bell within reach.  Pt verbalizes understanding to call before attempting to get out of bed.

## 2018-04-18 NOTE — Progress Notes (Signed)
Echocardiogram 2D Echocardiogram has been performed.  Kristina Summers 04/18/2018, 2:15 PM

## 2018-04-18 NOTE — Code Documentation (Signed)
Patient LKW yesterday afternoon about 5:30pm. This morning when her son checked on her she was aphasic and had gait disturbance.  EMS called code stroke at [phone].  Patient arrived at 0744, stat head CT and labs done.  Dr Leonel Ramsay at bedside to assess patient.  CTA/CTP done   NIHSS 12, expressive aphasia, right leg weakness and right facial droop, slurred speech.  Hand off with ED RN Benjamine Mola.  Plan q2 hour neuro checks and admission for stroke work up.

## 2018-04-18 NOTE — ED Notes (Signed)
CBG obtained : 275 mg/dL

## 2018-04-18 NOTE — ED Notes (Signed)
Attempted report 

## 2018-04-18 NOTE — H&P (Signed)
History and Physical    Kristina Summers DZH:[id number] DOB: Feb 11, 1936 DOA: 04/18/2018  PCP: [facility] [name] Consultants:  Agustin Cree - cardiology; Arbutus Leas - endocrinology; Megan Salon - ID; Buccini - GI; Bridgett Larsson - vascular; Asenso - ENT Patient coming from: Home - lives alone; NOKMechele Summers, daughter, [phone]  Chief Complaint: aphasia  HPI: Kristina Summers is a 82 y.o. [place] female with medical history significant of DM; PVD; afib on Elquis; s/p TAVR 11/08/17; HTN; HLD; CVA; diastolic CHF; CAD s/p CABG; and CKD presenting with aphasia.  She was fine about 5:30 pm - her son went to make her supper.  This AM, he went to make her breakfast and she had great difficulty getting to the door.  She managed to make it and her right face was sagging and she wasn't able to speak.  Her foot is still bothering her, has not seemed to be getting worse although her toenails are blackening a little.  She was weak on the right side a little, both right arm and leg.  Patient has had multiple recent admissions including: 11/25-11/30 - CHF exacerbation complicated by symptomatic anemia thought to be associated with occult GI bleeding 12/2-5 - CHF exacerbation complicated by symptomatic anemia thought to be associated with occult GI bleeding 12/23-28 - CHF exacerbation with AKI on stage 4 CKD  ED Course:  CVA - last seen normal last night.  Code stroke, neurology involved. Inability to speak since she was found this AM.  On exam, severe expressive aphasia.  Also with R leg ulcer, has dopplerable pulses.  Not a candidate for intervention. Suggests stopping Eliquis, giving ASA.  Glucose >400 without anion gap.  Stable CKD and Hgb.  Given 10 units SQ Novolog.  Review of Systems: As per HPI; otherwise review of systems reviewed and negative.   Ambulatory Status:  Ambulates with a rollator  Past Medical History:  Diagnosis Date  . Anemia   . CAD (coronary artery disease)    a. s/p CABG in 2000  . Carotid artery occlusion    . CKD (chronic kidney disease)   . Diastolic CHF ([place]) 89/2119  . Fibromyalgia   . GERD (gastroesophageal reflux disease)   . History of CVA (cerebrovascular accident) 05/24/2014  . History of GI bleed   . Hyperlipidemia   . Hypertension   . Obesity   . PAF (paroxysmal atrial fibrillation) (HCC)    a. on Eliquis  . Peripheral vascular disease ([place])   . 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 Type II diabetes mellitus ([place]) dx'd 1979    Past Surgical History:  Procedure Laterality Date  . ABDOMINAL AORTAGRAM N/A 04/01/2011   Procedure: ABDOMINAL AORTAGRAM;  Surgeon: Conrad Jericho, MD;  Location: [facility] CATH LAB;  Service: Cardiovascular;  Laterality: N/A;  . ANGIOPLASTY  06/17/11   Left leg common femoral artery cannulation under u/s Left leg runoff  . ARTERY BIOPSY Right 04/13/2018   Procedure: BIOPSY TEMPORAL ARTERY RIGHT;  Surgeon: Rosetta Posner, MD;  Location: [place];  Service: Vascular;  Laterality: Right;  . CARDIAC CATHETERIZATION  10/11/2017  . CARPAL TUNNEL RELEASE Right   . CATARACT EXTRACTION W/ INTRAOCULAR LENS  IMPLANT, BILATERAL  2004-2005  . CORONARY ARTERY BYPASS GRAFT  2000   CABG X5  . ESOPHAGOGASTRODUODENOSCOPY (EGD) WITH PROPOFOL N/A 12/25/2017   Procedure: ESOPHAGOGASTRODUODENOSCOPY (EGD) WITH PROPOFOL;  Surgeon: Wonda Horner, MD;  Location:  [facility] ENDOSCOPY;  Service: Endoscopy;  Laterality: N/A;  . EYE SURGERY Left    "lasered before cataract OR"  . GIVENS CAPSULE STUDY  03/14/2018  . GIVENS CAPSULE STUDY N/A 03/14/2018   Procedure: GIVENS CAPSULE STUDY;  Surgeon: Ronald Lobo, MD;  Location: [place];  Service: Endoscopy;  Laterality: N/A;  . LOWER EXTREMITY ANGIOGRAM Bilateral 04/01/2011   Procedure: LOWER EXTREMITY ANGIOGRAM;  Surgeon: Conrad Fronton, MD;  Location: [facility] CATH LAB;  Service: Cardiovascular;  Laterality: Bilateral;  .  LOWER EXTREMITY ANGIOGRAM Left 06/17/2011   Procedure: LOWER EXTREMITY ANGIOGRAM;  Surgeon: Conrad Holdrege, MD;  Location: [facility] CATH LAB;  Service: Cardiovascular;  Laterality: Left;  . LOWER EXTREMITY ANGIOGRAM N/A 11/18/2011   Procedure: LOWER EXTREMITY ANGIOGRAM;  Surgeon: Conrad Chebanse, MD;  Location: [facility] CATH LAB;  Service: Cardiovascular;  Laterality: N/A;  . PERCUTANEOUS STENT INTERVENTION Right 04/01/2011   Procedure: PERCUTANEOUS STENT INTERVENTION;  Surgeon: Conrad McCaysville, MD;  Location: [facility] CATH LAB;  Service: Cardiovascular;  Laterality: Right;  rt iliac stent  . PERIPHERAL ARTERIAL STENT GRAFT  04/01/11   right common iliac  . RIGHT/LEFT HEART CATH AND CORONARY/GRAFT ANGIOGRAPHY N/A 10/11/2017   Procedure: RIGHT/LEFT HEART CATH AND CORONARY/GRAFT ANGIOGRAPHY;  Surgeon: Sherren Mocha, MD;  Location: [place] CV LAB;  Service: Cardiovascular;  Laterality: N/A;  . TEE WITHOUT CARDIOVERSION N/A 11/08/2017   Procedure: TRANSESOPHAGEAL ECHOCARDIOGRAM (TEE);  Surgeon: Sherren Mocha, MD;  Location: [place];  Service: Open Heart Surgery;  Laterality: N/A;  . TEE WITHOUT CARDIOVERSION N/A 12/15/2017   Procedure: TRANSESOPHAGEAL ECHOCARDIOGRAM (TEE);  Surgeon: Larey Dresser, MD;  Location: [facility] ENDOSCOPY;  Service: Cardiovascular;  Laterality: N/A;  . TRANSCATHETER AORTIC VALVE REPLACEMENT, TRANSFEMORAL N/A 11/08/2017   Procedure: TRANSCATHETER AORTIC VALVE REPLACEMENT, TRANSFEMORAL;  Surgeon: Sherren Mocha, MD;  Location: [place];  Service: Open Heart Surgery;  Laterality: N/A;  . TRIGGER FINGER RELEASE Left 1996   thumb    Social History   Socioeconomic History  . Marital status: Divorced    Spouse name: Not on file  . Number of children: 3  . Years of education: 58  . Highest education level: Not on file  Occupational History  . Occupation: Retired  Scientific laboratory technician  . Financial resource strain: Not hard at all  . Food insecurity:    Worry: Never true    Inability: Never true  .  Transportation needs:    Medical: No    Non-medical: No  Tobacco Use  . Smoking status: Former Smoker    Packs/day: 2.00    Years: 30.00    Pack years: 60.00    Types: Cigarettes    Last attempt to quit: 04/19/1990    Years since quitting: 28.0  . Smokeless tobacco: Never Used  . Tobacco comment: stopped smoking cigarettes 1991  Substance and Sexual Activity  . Alcohol use: Not Currently    Alcohol/week: 0.0 standard drinks    Comment: "tried different alcohols when I was 1st married; never drank much at  ALL"  . Drug use: Never  . Sexual activity: Not Currently  Lifestyle  . Physical activity:    Days per week: 0 days    Minutes per session: 0 min  . Stress: Only a little  Relationships  . Social connections:    Talks on phone: Three times a week    Gets together: Once a week    Attends religious service: 1 to 4 times per year    Active member of  club or organization: No    Attends meetings of clubs or organizations: Never    Relationship status: Divorced  . Intimate partner violence:    Fear of current or ex partner: No    Emotionally abused: No    Physically abused: No    Forced sexual activity: No  Other Topics Concern  . Not on file  Social History Narrative   Divorced.  Native of [place].  Formerly worked as Education administrator person. 01/13/17 lives alone   Caffeine use: drinks decaf tea and coffee    Allergies  Allergen Reactions  . Morphine And Related Nausea And Vomiting and Other (See Comments)    Chest pain, also   . Lisinopril Cough  . Zoloft [Sertraline Hcl] Nausea Only         Family History  Problem Relation Age of Onset  . Other Brother        intestinal blockage  . Diabetes Brother     Prior to Admission medications   Medication Sig Start Date End Date Taking? Authorizing Provider  acetaminophen (TYLENOL) 500 MG tablet Take 500 mg by mouth daily as needed for moderate pain or headache.    [provider]  amLODipine (NORVASC) 10 MG tablet  Take 10 mg by mouth daily.     [provider]  apixaban (ELIQUIS) 2.5 MG TABS tablet Take 1 tablet (2.5 mg total) by mouth 2 (two) times daily. 04/15/18   Kayleen Memos, DO  atorvastatin (LIPITOR) 80 MG tablet Take 1 tablet (80 mg total) by mouth daily. Patient taking differently: Take by mouth daily at 6 PM.  11/27/12   Baker, Freeman Caldron, PA-C  doxycycline (VIBRA-TABS) 100 MG tablet Take 1 tablet (100 mg total) by mouth daily for 10 days. 04/15/18 04/25/18  Kayleen Memos, DO  furosemide (LASIX) 40 MG tablet Take 1 tablet (40 mg total) by mouth daily. Start tomorrow 12/6 04/15/18   Kayleen Memos, DO  glucose 4 GM chewable tablet Chew 1-4 tablets by mouth once as needed for low blood sugar.     [provider]  insulin aspart (NOVOLOG) 100 UNIT/ML injection Inject 8 Units into the skin 3 (three) times daily with meals. 04/15/18   Kayleen Memos, DO  insulin NPH Human (HUMULIN N,NOVOLIN N) 100 UNIT/ML injection Inject 0.18 mLs (18 Units total) into the skin daily before breakfast. 04/16/18   Kayleen Memos, DO  insulin NPH Human (HUMULIN N,NOVOLIN N) 100 UNIT/ML injection Inject 0.12 mLs (12 Units total) into the skin daily before supper. 04/15/18   Kayleen Memos, DO  metoprolol tartrate (LOPRESSOR) 25 MG tablet Take 1 tablet (25 mg total) by mouth 2 (two) times daily. 03/18/18 04/17/18  Arrien, Jimmy Picket, MD  Multiple Vitamin (MULTIVITAMIN WITH MINERALS) TABS tablet Take 1 tablet by mouth daily. One a Day 50 plus    [provider]  nitroGLYCERIN (NITROSTAT) 0.4 MG SL tablet Place 0.4 mg under the tongue every 5 (five) minutes as needed for chest pain.    [provider]  nutrition supplement, JUVEN, (JUVEN) PACK Take 1 packet by mouth 2 (two) times daily between meals. 04/15/18   Kayleen Memos, DO  pantoprazole (PROTONIX) 40 MG tablet Take 1 tablet (40 mg total) by mouth daily for 30 doses. 03/18/18 04/17/18  Arrien, Jimmy Picket, MD  predniSONE  (DELTASONE) 50 MG tablet Take 1 tablet 50 mg daily x2 weeks Then taper down to 40 mg daily x2 weeks Then taper again according  to your provider Please follow-up with your provider/PCP within a week 04/16/18   Kayleen Memos, DO    Physical Exam: Vitals:   04/18/18 0806 04/18/18 0813 04/18/18 0819 04/18/18 0830  BP: (!) 165/75 (!) 179/84  (!) 168/68  Pulse: 81 88  86  Resp: 19 (!) 23  18  Temp:   97.6 F (36.4 C)   TempSrc:   Oral   SpO2: 96% 95%  94%     General:  Appears calm and comfortable with great disposition but severe expressive aphasia Eyes:  Cloudy lenses bilaterally, c/w chronic visual impairment; EOMI, normal lids, iris ENT:  grossly normal hearing, lips & tongue, mmm Neck:  no LAD, masses or thyromegaly; R carotid bruit vs. Murmur radiation into the carotid (hard to distinguish) Cardiovascular:  Irregularly irregular, rate controlled, no r/g, 8-3/3 systolic murmur s/p TAVR. No LE edema.  Respiratory:   CTA bilaterally with no wheezes/rales/rhonchi.  Normal respiratory effort. Abdomen:  soft, NT, ND, NABS Skin:  Chronic ulceration along the lateral right foot with erythema and edema extending to just below the knee     Musculoskeletal: 4-5/5 strength RUE and 3/5 strength RLE with preserved strength on the left, good ROM, no bony abnormality Psychiatric:  grossly normal mood and affect, severe expressive aphasia but follows commands well Neurologic: R facial droop is subtle and she reports diminished sensation along the entire R face    Radiological Exams on Admission: Ct Angio Head W Or Wo Contrast  Result Date: 04/18/2018 CLINICAL DATA:  82 year old female code stroke, right facial droop. EXAM: CT ANGIOGRAPHY HEAD AND NECK CT PERFUSION BRAIN TECHNIQUE: Multidetector CT imaging of the head and neck was performed using the standard protocol during bolus administration of intravenous contrast. Multiplanar CT image reconstructions and MIPs were obtained to evaluate  the vascular anatomy. Carotid stenosis measurements (when applicable) are obtained utilizing NASCET criteria, using the distal internal carotid diameter as the denominator. Multiphase CT imaging of the brain was performed following IV bolus contrast injection. Subsequent parametric perfusion maps were calculated using RAPID software. CONTRAST:  116m ISOVUE-370 IOPAMIDOL (ISOVUE-370) INJECTION 76% COMPARISON:  CT head without contrast 0753 hours today. Prior CTA head and neck 05/30/2014. FINDINGS: CT Brain Perfusion Findings: CBF (<30%) Volume: 060mPerfusion (Tmax>6.0s) volume: 1279mismatch Volume: 16m70mfarction Location:Penumbra location is in the posterior left MCA territory CTA NECK Skeleton: Absent dentition. Prior sternotomy. Osteopenia. Cervical spine degeneration. No acute osseous abnormality identified. Upper chest: Chronic subpleural reticular opacity +/-paraseptal emphysema in both upper lungs. Superior mediastinal lymph nodes are within normal limits. Other neck: Stable and negative; atrophied or absent left submandibular gland as before. Aortic arch: Prior CABG. Extensive Calcified aortic atherosclerosis. 3 vessel arch configuration. Right carotid system: Bulky calcified plaque at the brachiocephalic origin and proximal segment with up to 50% stenosis. Mild plaque at the right CCA origin without stenosis. Tortuous proximal right CCA. Calcified plaque at the right carotid bifurcation and proximal right ICA. No significant cervical right ICA stenosis. Mild tortuosity. Left carotid system: Calcified plaque at the left CCA origin does not appear significant. Intermittent calcified plaque proximal to the bifurcation without stenosis. Bulky calcified plaque at the left ICA origin is stable since 2016 with less than 50 % stenosis with respect to the distal vessel. Tortuous cervical left ICA. Vertebral arteries: Proximal right subclavian artery plaque but no significant stenosis. The right vertebral origin is  spared. The right vertebral artery is tortuous throughout the neck but patent to the skull base without  stenosis. Bulky calcified plaque in the proximal left subclavian artery. Subsequent stenosis is roughly 50%. The left vertebral artery origin is largely spared. The left vertebral is mildly dominant and patent to the skull base without stenosis. CTA HEAD Posterior circulation: Bilateral V4 segment calcified plaque without significant stenosis. The left V4 segment is dominant. Normal bilateral PICA origins. Patent vertebrobasilar junction and basilar artery without stenosis. Normal SCA and PCA origins. Posterior communicating arteries are diminutive or absent. Bilateral PCA branches are stable and within normal limits. Anterior circulation: Both ICA siphons are patent but heavily calcified. On the right there is up to moderate stenosis in the supraclinoid segment. On the left there is moderate cavernous and supraclinoid stenosis. Patent carotid termini. Normal MCA and ACA origins. Anterior communicating artery and bilateral ACA branches are within normal limits aside from calcified atherosclerosis at the distal left ACA A2 or a 3 branch point (series 12, image 26), new since 2016. Right MCA M1 segment, bifurcation, and right MCA branches are stable and within normal limits. The left MCA M1 and left MCA trifurcation are patent without stenosis. No M2 occlusion is identified. But a posterior M3 branch of the middle M2 division is occluded as seen on series 12, image 35 and was patent on the 2016 CTA. Venous sinuses: Patent. Anatomic variants: Dominant left vertebral artery. Review of the MIP images confirms the above findings IMPRESSION: 1. Negative for LVO but positive for Left MCA M3 branch occlusion, and CTP detects 12 mL of ischemic penumbra in the corresponding posterior left MCA territory. No infarct core detected. This was reviewed by telephone with Dr. Roland Rack on 04/18/2018 at 0820 hours. 2.  Extensive chronic large vessel calcified atherosclerosis in the head and neck, and Aortic atherosclerosis (ICD10-I70.0). - moderate stenoses of the bilateral ICA siphons. - no hemodynamically significant cervical ICA or vertebral artery stenosis. - up to 50% stenosis of multiple proximal great vessels. Electronically Signed   By: Genevie Ann M.D.   On: 04/18/2018 08:36   Ct Angio Neck W Or Wo Contrast  Result Date: 04/18/2018 CLINICAL DATA:  82 year old female code stroke, right facial droop. EXAM: CT ANGIOGRAPHY HEAD AND NECK CT PERFUSION BRAIN TECHNIQUE: Multidetector CT imaging of the head and neck was performed using the standard protocol during bolus administration of intravenous contrast. Multiplanar CT image reconstructions and MIPs were obtained to evaluate the vascular anatomy. Carotid stenosis measurements (when applicable) are obtained utilizing NASCET criteria, using the distal internal carotid diameter as the denominator. Multiphase CT imaging of the brain was performed following IV bolus contrast injection. Subsequent parametric perfusion maps were calculated using RAPID software. CONTRAST:  143m ISOVUE-370 IOPAMIDOL (ISOVUE-370) INJECTION 76% COMPARISON:  CT head without contrast 0753 hours today. Prior CTA head and neck 05/30/2014. FINDINGS: CT Brain Perfusion Findings: CBF (<30%) Volume: 098mPerfusion (Tmax>6.0s) volume: 1272mismatch Volume: 64m80mfarction Location:Penumbra location is in the posterior left MCA territory CTA NECK Skeleton: Absent dentition. Prior sternotomy. Osteopenia. Cervical spine degeneration. No acute osseous abnormality identified. Upper chest: Chronic subpleural reticular opacity +/-paraseptal emphysema in both upper lungs. Superior mediastinal lymph nodes are within normal limits. Other neck: Stable and negative; atrophied or absent left submandibular gland as before. Aortic arch: Prior CABG. Extensive Calcified aortic atherosclerosis. 3 vessel arch configuration. Right  carotid system: Bulky calcified plaque at the brachiocephalic origin and proximal segment with up to 50% stenosis. Mild plaque at the right CCA origin without stenosis. Tortuous proximal right CCA. Calcified plaque at the right carotid bifurcation and  proximal right ICA. No significant cervical right ICA stenosis. Mild tortuosity. Left carotid system: Calcified plaque at the left CCA origin does not appear significant. Intermittent calcified plaque proximal to the bifurcation without stenosis. Bulky calcified plaque at the left ICA origin is stable since 2016 with less than 50 % stenosis with respect to the distal vessel. Tortuous cervical left ICA. Vertebral arteries: Proximal right subclavian artery plaque but no significant stenosis. The right vertebral origin is spared. The right vertebral artery is tortuous throughout the neck but patent to the skull base without stenosis. Bulky calcified plaque in the proximal left subclavian artery. Subsequent stenosis is roughly 50%. The left vertebral artery origin is largely spared. The left vertebral is mildly dominant and patent to the skull base without stenosis. CTA HEAD Posterior circulation: Bilateral V4 segment calcified plaque without significant stenosis. The left V4 segment is dominant. Normal bilateral PICA origins. Patent vertebrobasilar junction and basilar artery without stenosis. Normal SCA and PCA origins. Posterior communicating arteries are diminutive or absent. Bilateral PCA branches are stable and within normal limits. Anterior circulation: Both ICA siphons are patent but heavily calcified. On the right there is up to moderate stenosis in the supraclinoid segment. On the left there is moderate cavernous and supraclinoid stenosis. Patent carotid termini. Normal MCA and ACA origins. Anterior communicating artery and bilateral ACA branches are within normal limits aside from calcified atherosclerosis at the distal left ACA A2 or a 3 branch point (series  12, image 26), new since 2016. Right MCA M1 segment, bifurcation, and right MCA branches are stable and within normal limits. The left MCA M1 and left MCA trifurcation are patent without stenosis. No M2 occlusion is identified. But a posterior M3 branch of the middle M2 division is occluded as seen on series 12, image 35 and was patent on the 2016 CTA. Venous sinuses: Patent. Anatomic variants: Dominant left vertebral artery. Review of the MIP images confirms the above findings IMPRESSION: 1. Negative for LVO but positive for Left MCA M3 branch occlusion, and CTP detects 12 mL of ischemic penumbra in the corresponding posterior left MCA territory. No infarct core detected. This was reviewed by telephone with Dr. Roland Rack on 04/18/2018 at 0820 hours. 2. Extensive chronic large vessel calcified atherosclerosis in the head and neck, and Aortic atherosclerosis (ICD10-I70.0). - moderate stenoses of the bilateral ICA siphons. - no hemodynamically significant cervical ICA or vertebral artery stenosis. - up to 50% stenosis of multiple proximal great vessels. Electronically Signed   By: Genevie Ann M.D.   On: 04/18/2018 08:36   Ct Cerebral Perfusion W Contrast  Result Date: 04/18/2018 CLINICAL DATA:  82 year old female code stroke, right facial droop. EXAM: CT ANGIOGRAPHY HEAD AND NECK CT PERFUSION BRAIN TECHNIQUE: Multidetector CT imaging of the head and neck was performed using the standard protocol during bolus administration of intravenous contrast. Multiplanar CT image reconstructions and MIPs were obtained to evaluate the vascular anatomy. Carotid stenosis measurements (when applicable) are obtained utilizing NASCET criteria, using the distal internal carotid diameter as the denominator. Multiphase CT imaging of the brain was performed following IV bolus contrast injection. Subsequent parametric perfusion maps were calculated using RAPID software. CONTRAST:  144m ISOVUE-370 IOPAMIDOL (ISOVUE-370) INJECTION  76% COMPARISON:  CT head without contrast 0753 hours today. Prior CTA head and neck 05/30/2014. FINDINGS: CT Brain Perfusion Findings: CBF (<30%) Volume: 081mPerfusion (Tmax>6.0s) volume: 1241mismatch Volume: 40m13mfarction Location:Penumbra location is in the posterior left MCA territory CTA NECK Skeleton: Absent dentition. Prior sternotomy.  Osteopenia. Cervical spine degeneration. No acute osseous abnormality identified. Upper chest: Chronic subpleural reticular opacity +/-paraseptal emphysema in both upper lungs. Superior mediastinal lymph nodes are within normal limits. Other neck: Stable and negative; atrophied or absent left submandibular gland as before. Aortic arch: Prior CABG. Extensive Calcified aortic atherosclerosis. 3 vessel arch configuration. Right carotid system: Bulky calcified plaque at the brachiocephalic origin and proximal segment with up to 50% stenosis. Mild plaque at the right CCA origin without stenosis. Tortuous proximal right CCA. Calcified plaque at the right carotid bifurcation and proximal right ICA. No significant cervical right ICA stenosis. Mild tortuosity. Left carotid system: Calcified plaque at the left CCA origin does not appear significant. Intermittent calcified plaque proximal to the bifurcation without stenosis. Bulky calcified plaque at the left ICA origin is stable since 2016 with less than 50 % stenosis with respect to the distal vessel. Tortuous cervical left ICA. Vertebral arteries: Proximal right subclavian artery plaque but no significant stenosis. The right vertebral origin is spared. The right vertebral artery is tortuous throughout the neck but patent to the skull base without stenosis. Bulky calcified plaque in the proximal left subclavian artery. Subsequent stenosis is roughly 50%. The left vertebral artery origin is largely spared. The left vertebral is mildly dominant and patent to the skull base without stenosis. CTA HEAD Posterior circulation: Bilateral V4  segment calcified plaque without significant stenosis. The left V4 segment is dominant. Normal bilateral PICA origins. Patent vertebrobasilar junction and basilar artery without stenosis. Normal SCA and PCA origins. Posterior communicating arteries are diminutive or absent. Bilateral PCA branches are stable and within normal limits. Anterior circulation: Both ICA siphons are patent but heavily calcified. On the right there is up to moderate stenosis in the supraclinoid segment. On the left there is moderate cavernous and supraclinoid stenosis. Patent carotid termini. Normal MCA and ACA origins. Anterior communicating artery and bilateral ACA branches are within normal limits aside from calcified atherosclerosis at the distal left ACA A2 or a 3 branch point (series 12, image 26), new since 2016. Right MCA M1 segment, bifurcation, and right MCA branches are stable and within normal limits. The left MCA M1 and left MCA trifurcation are patent without stenosis. No M2 occlusion is identified. But a posterior M3 branch of the middle M2 division is occluded as seen on series 12, image 35 and was patent on the 2016 CTA. Venous sinuses: Patent. Anatomic variants: Dominant left vertebral artery. Review of the MIP images confirms the above findings IMPRESSION: 1. Negative for LVO but positive for Left MCA M3 branch occlusion, and CTP detects 12 mL of ischemic penumbra in the corresponding posterior left MCA territory. No infarct core detected. This was reviewed by telephone with Dr. Roland Rack on 04/18/2018 at 0820 hours. 2. Extensive chronic large vessel calcified atherosclerosis in the head and neck, and Aortic atherosclerosis (ICD10-I70.0). - moderate stenoses of the bilateral ICA siphons. - no hemodynamically significant cervical ICA or vertebral artery stenosis. - up to 50% stenosis of multiple proximal great vessels. Electronically Signed   By: Genevie Ann M.D.   On: 04/18/2018 08:36   Ct Head Code Stroke Wo  Contrast  Result Date: 04/18/2018 CLINICAL DATA:  Code stroke. 82 year old female with right facial droop last seen normal 1730 hours yesterday. EXAM: CT HEAD WITHOUT CONTRAST TECHNIQUE: Contiguous axial images were obtained from the base of the skull through the vertex without intravenous contrast. COMPARISON:  04/10/2018 and earlier. FINDINGS: Brain: Left frontal operculum and middle frontal gyrus encephalomalacia appears  stable from 1 week ago. No midline shift, mass effect, or evidence of intracranial mass lesion. No acute intracranial hemorrhage identified. No ventriculomegaly. No new cortical based infarct identified. Gray-white matter differentiation appears stable. Vascular: Extensive Calcified atherosclerosis at the skull base. No suspicious intracranial vascular hyperdensity. Skull: No acute osseous abnormality identified. Sinuses/Orbits: Visualized paranasal sinuses and mastoids are stable and well pneumatized. Other: No acute orbit or scalp soft tissue findings. ASPECTS [facility] Stroke Program Early CT Score) - Ganglionic level infarction (caudate, lentiform nuclei, internal capsule, insula, M1-M3 cortex): 7 - Supraganglionic infarction (M4-M6 cortex): 3 Total score (0-10 with 10 being normal): 10 IMPRESSION: 1. Left frontal operculum and middle frontal gyrus encephalomalacia appears stable from 1 week ago. No acute intracranial hemorrhage or acute cortically based infarct identified. 2. ASPECTS is 10. 3. These results were communicated to Dr. Leonel Ramsay at [place] 12/31/2019by text page via the [facility] messaging system. Electronically Signed   By: Genevie Ann M.D.   On: 04/18/2018 08:15    EKG: Independently reviewed.  Afib with rate 83; IVCD; nonspecific ST changes with no evidence of acute ischemia; NSCSLT   Labs on Admission: I have personally reviewed the available labs and imaging studies at the time of the admission.  Pertinent labs:   Na++ 132 CO2 20 Glucose 397 BUN 62/Creatinine  1.64/GFR 29 - stable Anion gap 10 Albumin 2.1 Troponin 0.04 WBC 15.9 Hgb 8.7 - stable INR 1.14 ETOH <10 A1c 7.2 on 12/28  Assessment/Plan Active Problems:   * No active hospital problems. *    Left MCA CVA -Patient presented with severe expressive aphasia as well as right-sided weakness, very concerning for CVA -Code stroke was called and patient was positive for large vessel occlusion -However, not a tPA candidate due to Eliquis and not an IR intervention candidate either -Interestingly but likely unrelated, she was admitted 12/23 with headache and right temporal artery pain with elevated ESR; temporal artery biopsy was negative. -Will admit for CVA evaluation -Telemetry monitoring -MRI -CTA head and neck -Echo -Risk stratification with FLP -ASA daily for now, holding Eliquis as per neurology -Neurology consult -PT/OT/ST/Nutrition Consults -SW consult for placement -Rehab consult  HTN -Allow permissive HTN for now -Treat BP only if >220/120, and then with goal of 15% reduction -Hold CCB, BB and plan to restart in 48-72 hours   HLD -Check FLP -Continue Lipitor 80 mg daily   DM -Recent A1c shows reasonably good control at 7.2 -Continue NPH BID -Will order moderate-scale SSI   Chronic systolic CHF -Echo today with EF 50-55% -Appears to be compensated at this time -Consider d/c Norvasc due to risk of edema/CHF  Recent ABLA due to GI bleeding -Appears to be stable at this time  Afib/s/p TAVR, on Eliquis -Rate controlled with Lopressor, but this is on hold due to CVA -Hold Eliquis given CVA due to afib despite Eliquis, as per neurology  CKD -Appears to be stable at this time -Recheck BMP in AM  R foot ulcer with cellulitis -Foot ulcer is present and draining; this is chronic in nature and was evaluated by vascular during her last hospitalization -Foot xray previously negative for osteomyelitis -There is now surrounding cellulitis extending up to near the  knee -Will cover with Rocephin and Flagyl as per the order set -Wound care consult -Consult diabetes coordinator -Followed by vascular, had ABIs done recently and so will not repeat; consider consult if not improving  DVT prophylaxis:  Lovenox  Code Status: DNR - confirmed with patient/family  Family Communication: Son and daughter-in-law present throughout evaluation Disposition Plan:  To be determined Consults called: Neurology; PT/OT/ST/SW/Nutrition; Wound care, diabetes coordinator  Admission status: Admit - It is my clinical opinion that admission to INPATIENT is reasonable and necessary because of the expectation that this patient will require hospital care that crosses at least 2 midnights to treat this condition based on the medical complexity of the problems presented.  Given the aforementioned information, the predictability of an adverse outcome is felt to be significant.  Karmen Bongo MD [facility]  If note is complete, please contact covering daytime or nighttime physician. [email or web address] Password [email or web address]  04/18/2018, 8:58 AM

## 2018-04-18 NOTE — ED Notes (Signed)
PT finished in MRI and going to vascular lab

## 2018-04-18 NOTE — Consult Note (Signed)
Neurology Consultation Reason for Consult: Stroke Referring Physician: Lyn Hollingshead  CC: Aphasia  History is obtained from: Son  HPI: Kristina Summers is a 82 y.o. female with a history of previous stroke causing mild slowed speech who is on Eliquis for atrial fibrillation and is compliant with the medication per her son.  She was recently hospitalized after a new onset temporal headache with an ESR of 127 prompted a biopsy for temporal arteritis.  This was biopsy negative.  This morning, she awoke and was unable to speak and had right leg weakness and therefore her son called 911.  He last saw her speaking normally at 5:30 PM last night, and so this is her last known well.   LKW: 5:30 PM tpa given?: no, onset of window Premorbid modified rankin scale: 3    ROS: Unable to obtain due to altered mental status.   Past Medical History:  Diagnosis Date  . Anemia   . CAD (coronary artery disease)    a. s/p CABG in 2000  . Carotid artery occlusion   . CKD (chronic kidney disease)   . Diastolic CHF ([place]) 66/4403  . Fibromyalgia   . GERD (gastroesophageal reflux disease)   . History of CVA (cerebrovascular accident) 05/24/2014  . History of GI bleed   . Hyperlipidemia   . Hypertension   . Obesity   . PAF (paroxysmal atrial fibrillation) ([facility])    a. on Eliquis  . Peripheral vascular disease ([place])   . 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 Type II diabetes mellitus ([place]) dx'd 1979     Family History  Problem Relation Age of Onset  . Other Brother        intestinal blockage  . Diabetes Brother      Social History:  reports that she quit smoking about 28 years ago. Her smoking use included cigarettes. She has a 60.00 pack-year smoking history. She has never used smokeless tobacco. She reports previous alcohol use. She reports that she does not use  drugs.   Exam: Current vital signs: BP (!) 180/67   Pulse 81   Temp 97.6 F (36.4 C) (Oral)   Resp 16   SpO2 97%  Vital signs in last 24 hours: Temp:  [97.6 F (36.4 C)] 97.6 F (36.4 C) (12/31 0819) Pulse Rate:  [78-88] 81 (12/31 0900) Resp:  [14-23] 16 (12/31 0900) BP: (165-182)/(67-96) 180/67 (12/31 0900) SpO2:  [94 %-98 %] 97 % (12/31 0900)   Physical Exam  Constitutional: Appears well-developed and well-nourished.  Psych: Affect appropriate to situation Eyes: No scleral injection HENT: No OP obstrucion Head: Normocephalic.  Cardiovascular: Normal rate and regular rhythm.  Respiratory: Effort normal, non-labored breathing GI: Soft.  No distension. There is no tenderness.  Skin: WDI  Neuro: Mental Status: Patient is awake, alert, expressive > receptive aphasia. She does follow commands, but minimal output and what she does say is unformed sounds.  Cranial Nerves: II: does not clearly blink from either side, but seems to see the left better than right, suspect R hemianopia, does fixate and track.  Pupils are equal, round, and reactive to light.   III,IV, VI: EOMI without ptosis or diploplia.  VII: Facial movement with right facial weakness VIII: hearing is intact to voice X: Uvula elevates symmetrically XI: Shoulder shrug is symmetric. XII: tongue is midline without atrophy or fasciculations.  Motor: She has 4+/5  strength without drift in the right arm, 3/5 R leg, good strength on the left.  Sensory: Responds to nox stim x 4.  Cerebellar: FNF and HKS are intact bilaterally   I have reviewed labs in epic and the results pertinent to this consultation are: cmp - glucose 397, cr 1.64  I have reviewed the images obtained:CT/CTA - M3 occlusion   Impression: 82 yo with distal mca branch occlusion. She is not a candidate for tpa dfue to eliquis or IR due to distal location. She will need admissino for therapy and secondary risk factor modification.   No signs of  temporal arteritis on biopsy.   Recommendations: - HgbA1c, fasting lipid panel - MRI  of the brain without contrast - Frequent neuro checks - Echocardiogram - Carotid dopplers - Prophylactic therapy-Antiplatelet med: Aspirin - dose '325mg'$  PO or '300mg'$  PR, hold eliquis for now.  - Risk factor modification - Telemetry monitoring - PT consult, OT consult, Speech consult - Stroke team to follow    Roland Rack, MD [facility] [phone]  If 7pm- 7am, please page neurology on call as listed in [place].

## 2018-04-18 NOTE — ED Provider Notes (Addendum)
[facility] EMERGENCY DEPARTMENT Provider Note   CSN: [id number] Arrival date & time: 04/18/18  0744     History   Chief Complaint No chief complaint on file. Chief complaint inability to speak.  Level 5 caveat acuity of situation, patient unable to speak.  History is obtained from paramedics who accompany patient  HPI Kristina Summers is a 82 y.o. female.  HPI Patient last seen normal 5:30 PM yesterday.  She was found unable to speak this morning by her son.  Patient had normal pulse oximetry in the field.  Treated with supplemental oxygen "for comfort by EMS EMS also obtained CBG which was approximately 400.  Code stroke called in the field Past Medical History:  Diagnosis Date  . Anemia   . CAD (coronary artery disease)    a. s/p CABG in 2000  . Carotid artery occlusion   . CKD (chronic kidney disease)   . Diastolic CHF ([place]) 43/3295  . Fibromyalgia   . GERD (gastroesophageal reflux disease)   . History of CVA (cerebrovascular accident) 05/24/2014  . History of GI bleed   . Hyperlipidemia   . Hypertension   . Obesity   . PAF (paroxysmal atrial fibrillation) (HCC)    a. on Eliquis  . Peripheral vascular disease ([place])   . 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 Type II diabetes mellitus ([place]) dx'd 1979    Patient Active Problem List   Diagnosis Date Noted  . CHF exacerbation ([place]) 04/10/2018  . Acute exacerbation of congestive heart failure ([place]) 03/20/2018  . Gastrointestinal hemorrhage   . Acute on chronic systolic CHF (congestive heart failure) ([place]) 03/13/2018  . Decreased hearing 02/03/2018  . History of myocardial infarction 02/03/2018  . Methicillin resistant Staphylococcus aureus infection 02/03/2018  . Acute blood loss anemia 12/22/2017  . MRSA bacteremia 12/12/2017  . CKD (chronic kidney disease), stage III ([place])  12/11/2017  . UTI (urinary tract infection) 12/11/2017  . Foot ulcer, right ([place]) 12/11/2017  . 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 Severe aortic stenosis 10/11/2017  . Hypochromic anemia 08/25/2017  . Essential hypertension 06/10/2017  . Type II diabetes mellitus with renal manifestations ([place]) 06/10/2017  . Acute on chronic congestive heart failure ([place]) 01/28/2017  . Fall   . Fracture of distal end of right fibula 05/29/2014  . History of CVA (cerebrovascular accident) 05/24/2014  . Peripheral vascular disease, unspecified ([place]) 01/11/2014  . CAD (coronary artery disease)   . Paroxysmal atrial fibrillation   . Diabetes mellitus type 2 with peripheral artery disease ([place])   . Hyperlipidemia   . History of GI bleed 04/21/2009  . S/P CABG (coronary artery bypass graft) 11/20/1998    Past Surgical History:  Procedure Laterality Date  . ABDOMINAL AORTAGRAM N/A 04/01/2011   Procedure: ABDOMINAL AORTAGRAM;  Surgeon: Conrad [place]th[place], MD;  Location: [facility] CATH LAB;  Service: Cardiovascular;  Laterality: N/A;  . ANGIOPLASTY  06/17/11   Left leg common femoral artery cannulation under u/s Left leg runoff  . ARTERY BIOPSY Right 04/13/2018   Procedure: BIOPSY TEMPORAL ARTERY RIGHT;  Surgeon: Rosetta Posner, MD;  Location: [facility];  Service: Vascular;  Laterality: Right;  . CARDIAC CATHETERIZATION  10/11/2017  . CARPAL TUNNEL RELEASE Right   . CATARACT EXTRACTION W/ INTRAOCULAR LENS  IMPLANT, BILATERAL  2004-2005  . CORONARY ARTERY BYPASS GRAFT  2000  CABG X5  . ESOPHAGOGASTRODUODENOSCOPY (EGD) WITH PROPOFOL N/A 12/25/2017   Procedure: ESOPHAGOGASTRODUODENOSCOPY (EGD) WITH PROPOFOL;  Surgeon: Wonda Horner, MD;  Location: [facility] ENDOSCOPY;  Service: Endoscopy;  Laterality: N/A;  . EYE SURGERY Left    "lasered before cataract OR"  . GIVENS CAPSULE STUDY  03/14/2018  . GIVENS CAPSULE STUDY N/A 03/14/2018   Procedure: GIVENS CAPSULE STUDY;  Surgeon: Ronald Lobo, MD;   Location: [facility];  Service: Endoscopy;  Laterality: N/A;  . LOWER EXTREMITY ANGIOGRAM Bilateral 04/01/2011   Procedure: LOWER EXTREMITY ANGIOGRAM;  Surgeon: Conrad [place], MD;  Location: [facility] CATH LAB;  Service: Cardiovascular;  Laterality: Bilateral;  . LOWER EXTREMITY ANGIOGRAM Left 06/17/2011   Procedure: LOWER EXTREMITY ANGIOGRAM;  Surgeon: Conrad Redby, MD;  Location: [facility] CATH LAB;  Service: Cardiovascular;  Laterality: Left;  . LOWER EXTREMITY ANGIOGRAM N/A 11/18/2011   Procedure: LOWER EXTREMITY ANGIOGRAM;  Surgeon: Conrad [place], MD;  Location: [facility] CATH LAB;  Service: Cardiovascular;  Laterality: N/A;  . PERCUTANEOUS STENT INTERVENTION Right 04/01/2011   Procedure: PERCUTANEOUS STENT INTERVENTION;  Surgeon: Conrad [place], MD;  Location: [facility] CATH LAB;  Service: Cardiovascular;  Laterality: Right;  rt iliac stent  . PERIPHERAL ARTERIAL STENT GRAFT  04/01/11   right common iliac  . RIGHT/LEFT HEART CATH AND CORONARY/GRAFT ANGIOGRAPHY N/A 10/11/2017   Procedure: RIGHT/LEFT HEART CATH AND CORONARY/GRAFT ANGIOGRAPHY;  Surgeon: Sherren Mocha, MD;  Location: [facility] CV LAB;  Service: Cardiovascular;  Laterality: N/A;  . TEE WITHOUT CARDIOVERSION N/A 11/08/2017   Procedure: TRANSESOPHAGEAL ECHOCARDIOGRAM (TEE);  Surgeon: Sherren Mocha, MD;  Location: [facility];  Service: Open Heart Surgery;  Laterality: N/A;  . TEE WITHOUT CARDIOVERSION N/A 12/15/2017   Procedure: TRANSESOPHAGEAL ECHOCARDIOGRAM (TEE);  Surgeon: Larey Dresser, MD;  Location: [facility] ENDOSCOPY;  Service: Cardiovascular;  Laterality: N/A;  . TRANSCATHETER AORTIC VALVE REPLACEMENT, TRANSFEMORAL N/A 11/08/2017   Procedure: TRANSCATHETER AORTIC VALVE REPLACEMENT, TRANSFEMORAL;  Surgeon: Sherren Mocha, MD;  Location: [facility];  Service: Open Heart Surgery;  Laterality: N/A;  . TRIGGER FINGER RELEASE Left 1996   thumb     OB History   No obstetric history on file.      Home Medications    Prior to Admission medications   Medication  Sig Start Date End Date Taking? Authorizing Provider  acetaminophen (TYLENOL) 500 MG tablet Take 500 mg by mouth daily as needed for moderate pain or headache.    [provider]  amLODipine (NORVASC) 10 MG tablet Take 10 mg by mouth daily.     [provider]  apixaban (ELIQUIS) 2.5 MG TABS tablet Take 1 tablet (2.5 mg total) by mouth 2 (two) times daily. 04/15/18   Kayleen Memos, DO  atorvastatin (LIPITOR) 80 MG tablet Take 1 tablet (80 mg total) by mouth daily. Patient taking differently: Take by mouth daily at 6 PM.  11/27/12   Baker, Freeman Caldron, PA-C  doxycycline (VIBRA-TABS) 100 MG tablet Take 1 tablet (100 mg total) by mouth daily for 10 days. 04/15/18 04/25/18  Kayleen Memos, DO  furosemide (LASIX) 40 MG tablet Take 1 tablet (40 mg total) by mouth daily. Start tomorrow 12/6 04/15/18   Kayleen Memos, DO  glucose 4 GM chewable tablet Chew 1-4 tablets by mouth once as needed for low blood sugar.     [provider]  insulin aspart (NOVOLOG) 100 UNIT/ML injection Inject 8 Units into the skin 3 (three) times daily with meals. 04/15/18   Kayleen Memos, DO  insulin NPH Human (HUMULIN N,NOVOLIN N) 100 UNIT/ML injection Inject 0.18 mLs (18 Units total) into the skin daily before breakfast. 04/16/18   Kayleen Memos, DO  insulin NPH Human (HUMULIN N,NOVOLIN N) 100 UNIT/ML injection Inject 0.12 mLs (12 Units total) into the skin daily before supper. 04/15/18   Kayleen Memos, DO  metoprolol tartrate (LOPRESSOR) 25 MG tablet Take 1 tablet (25 mg total) by mouth 2 (two) times daily. 03/18/18 04/17/18  Arrien, Jimmy Picket, MD  Multiple Vitamin (MULTIVITAMIN WITH MINERALS) TABS tablet Take 1 tablet by mouth daily. One a Day 50 plus    [provider]  nitroGLYCERIN (NITROSTAT) 0.4 MG SL tablet Place 0.4 mg under the tongue every 5 (five) minutes as needed for chest pain.    [provider]  nutrition supplement, JUVEN, (JUVEN) PACK Take 1 packet by mouth 2  (two) times daily between meals. 04/15/18   Kayleen Memos, DO  pantoprazole (PROTONIX) 40 MG tablet Take 1 tablet (40 mg total) by mouth daily for 30 doses. 03/18/18 04/17/18  Arrien, Jimmy Picket, MD  predniSONE (DELTASONE) 50 MG tablet Take 1 tablet 50 mg daily x2 weeks Then taper down to 40 mg daily x2 weeks Then taper again according to your provider Please follow-up with your provider/PCP within a week 04/16/18   Kayleen Memos, DO    Family History Family History  Problem Relation Age of Onset  . Other Brother        intestinal blockage  . Diabetes Brother     Social History Social History   Tobacco Use  . Smoking status: Former Smoker    Packs/day: 2.00    Years: 30.00    Pack years: 60.00    Types: Cigarettes    Last attempt to quit: 04/19/1990    Years since quitting: 28.0  . Smokeless tobacco: Never Used  . Tobacco comment: stopped smoking cigarettes 1991  Substance Use Topics  . Alcohol use: Not Currently    Alcohol/week: 0.0 standard drinks    Comment: "tried different alcohols when I was 1st married; never drank much at  ALL"  . Drug use: Never     Allergies   Morphine and related; Lisinopril; and Zoloft [sertraline hcl]   Review of Systems Review of Systems  Unable to perform ROS: Acuity of condition  Skin: Positive for wound.       Foot ulcer  Allergic/Immunologic: Positive for immunocompromised state.       Diabetic  Neurological: Positive for speech difficulty.     Physical Exam Updated Vital Signs There were no vitals taken for this visit.  Physical Exam Vitals signs and nursing note reviewed.  Constitutional:      Appearance: She is well-developed.  HENT:     Head:     Comments: Slight right-sided mouth droop Eyes:     Conjunctiva/sclera: Conjunctivae normal.     Pupils: Pupils are equal, round, and reactive to light.  Neck:     Musculoskeletal: Neck supple.     Thyroid: No thyromegaly.     Trachea: No tracheal deviation.    Cardiovascular:     Rate and Rhythm: Normal rate.     Heart sounds: No murmur.     Comments: irRegularly irregular Pulmonary:     Effort: Pulmonary effort is normal.     Breath sounds: Normal breath sounds.  Abdominal:     General: Bowel sounds are normal. There is no distension.     Palpations: Abdomen is soft.  Tenderness: There is no abdominal tenderness.  Musculoskeletal: Normal range of motion.        General: No tenderness.     Comments: Right lower extremity with dime sized clean appearing ulcer at lateral aspect of foot.  Distal leg is reddened, not warm or cool in comparison to left lower extremity.  DP and PT pulses nonpalpable bilaterally DP pulses absent not obtainable by Doppler PT pulses obtainable by Doppler bilaterally feet are not cool to touch.  Pulses 2+ bilaterally.  Skin:    General: Skin is warm and dry.     Findings: No rash.  Neurological:     Mental Status: She is alert.     Coordination: Coordination normal.     Comments: Expressive a aphasia.  Right-sided central cranial nerve VII deficit other cranial nerves II through XII grossly intact.  Follow simple commands moves all extremity right lower extremity motor strength is 3/5 all other extremities motor strength 5/5      ED Treatments / Results  Labs (all labs ordered are listed, but only abnormal results are displayed) Labs Reviewed  CBG MONITORING, ED - Abnormal; Notable for the following components:      Result Value   Glucose-Capillary 376 (*)    All other components within normal limits  ETHANOL  PROTIME-INR  APTT  CBC  DIFFERENTIAL  COMPREHENSIVE METABOLIC PANEL  RAPID URINE DRUG SCREEN, HOSP PERFORMED  URINALYSIS, ROUTINE W REFLEX MICROSCOPIC  I-STAT CHEM 8, ED  I-STAT TROPONIN, ED   Results for orders placed or performed during the hospital encounter of 04/18/18  Ethanol  Result Value Ref Range   Alcohol, Ethyl (B) <10 <10 mg/dL  Protime-INR  Result Value Ref Range    Prothrombin Time 14.5 11.4 - 15.2 seconds   INR 1.14   APTT  Result Value Ref Range   aPTT 28 24 - 36 seconds  CBC  Result Value Ref Range   WBC 15.9 (H) 4.0 - 10.5 K/uL   RBC 3.31 (L) 3.87 - 5.11 MIL/uL   Hemoglobin 8.7 (L) 12.0 - 15.0 g/dL   HCT 28.4 (L) 36.0 - 46.0 %   MCV 85.8 80.0 - 100.0 fL   MCH 26.3 26.0 - 34.0 pg   MCHC 30.6 30.0 - 36.0 g/dL   RDW 19.5 (H) 11.5 - 15.5 %   Platelets 163 150 - 400 K/uL   nRBC 0.0 0.0 - 0.2 %  Differential  Result Value Ref Range   Neutrophils Relative % 89 %   Neutro Abs 14.0 (H) 1.7 - 7.7 K/uL   Lymphocytes Relative 5 %   Lymphs Abs 0.9 0.7 - 4.0 K/uL   Monocytes Relative 6 %   Monocytes Absolute 0.9 0.1 - 1.0 K/uL   Eosinophils Relative 0 %   Eosinophils Absolute 0.0 0.0 - 0.5 K/uL   Basophils Relative 0 %   Basophils Absolute 0.0 0.0 - 0.1 K/uL   Immature Granulocytes 0 %   Abs Immature Granulocytes 0.07 0.00 - 0.07 K/uL  Comprehensive metabolic panel  Result Value Ref Range   Sodium 132 (L) 135 - 145 mmol/L   Potassium 4.1 3.5 - 5.1 mmol/L   Chloride 102 98 - 111 mmol/L   CO2 20 (L) 22 - 32 mmol/L   Glucose, Bld 397 (H) 70 - 99 mg/dL   BUN 62 (H) 8 - 23 mg/dL   Creatinine, Ser 1.64 (H) 0.44 - 1.00 mg/dL   Calcium 8.6 (L) 8.9 - 10.3 mg/dL   Total Protein  7.3 6.5 - 8.1 g/dL   Albumin 2.1 (L) 3.5 - 5.0 g/dL   AST 43 (H) 15 - 41 U/L   ALT 41 0 - 44 U/L   Alkaline Phosphatase 104 38 - 126 U/L   Total Bilirubin 0.9 0.3 - 1.2 mg/dL   GFR calc non Af Amer 29 (L) >60 mL/min   GFR calc Af Amer 34 (L) >60 mL/min   Anion gap 10 5 - 15  I-Stat Chem 8, ED  Result Value Ref Range   Sodium 133 (L) 135 - 145 mmol/L   Potassium 4.0 3.5 - 5.1 mmol/L   Chloride 102 98 - 111 mmol/L   BUN 56 (H) 8 - 23 mg/dL   Creatinine, Ser 1.50 (H) 0.44 - 1.00 mg/dL   Glucose, Bld 413 (H) 70 - 99 mg/dL   Calcium, Ion 1.18 1.15 - 1.40 mmol/L   TCO2 21 (L) 22 - 32 mmol/L   Hemoglobin 9.9 (L) 12.0 - 15.0 g/dL   HCT 29.0 (L) 36.0 - 46.0 %  I-stat  troponin, ED  Result Value Ref Range   Troponin i, poc 0.04 0.00 - 0.08 ng/mL   Comment 3          CBG monitoring, ED  Result Value Ref Range   Glucose-Capillary 376 (H) 70 - 99 mg/dL   Comment 1 Notify RN    Comment 2 Document in Chart    Ct Angio Head W Or Wo Contrast  Result Date: 04/18/2018 CLINICAL DATA:  82 year old female code stroke, right facial droop. EXAM: CT ANGIOGRAPHY HEAD AND NECK CT PERFUSION BRAIN TECHNIQUE: Multidetector CT imaging of the head and neck was performed using the standard protocol during bolus administration of intravenous contrast. Multiplanar CT image reconstructions and MIPs were obtained to evaluate the vascular anatomy. Carotid stenosis measurements (when applicable) are obtained utilizing NASCET criteria, using the distal internal carotid diameter as the denominator. Multiphase CT imaging of the brain was performed following IV bolus contrast injection. Subsequent parametric perfusion maps were calculated using RAPID software. CONTRAST:  158mL ISOVUE-370 IOPAMIDOL (ISOVUE-370) INJECTION 76% COMPARISON:  CT head without contrast 0753 hours today. Prior CTA head and neck 05/30/2014. FINDINGS: CT Brain Perfusion Findings: CBF (<30%) Volume: 54mL Perfusion (Tmax>6.0s) volume: 70mL Mismatch Volume: 20mL Infarction Location:Penumbra location is in the posterior left MCA territory CTA NECK Skeleton: Absent dentition. Prior sternotomy. Osteopenia. Cervical spine degeneration. No acute osseous abnormality identified. Upper chest: Chronic subpleural reticular opacity +/-paraseptal emphysema in both upper lungs. Superior mediastinal lymph nodes are within normal limits. Other neck: Stable and negative; atrophied or absent left submandibular gland as before. Aortic arch: Prior CABG. Extensive Calcified aortic atherosclerosis. 3 vessel arch configuration. Right carotid system: Bulky calcified plaque at the brachiocephalic origin and proximal segment with up to 50% stenosis. Mild  plaque at the right CCA origin without stenosis. Tortuous proximal right CCA. Calcified plaque at the right carotid bifurcation and proximal right ICA. No significant cervical right ICA stenosis. Mild tortuosity. Left carotid system: Calcified plaque at the left CCA origin does not appear significant. Intermittent calcified plaque proximal to the bifurcation without stenosis. Bulky calcified plaque at the left ICA origin is stable since 2016 with less than 50 % stenosis with respect to the distal vessel. Tortuous cervical left ICA. Vertebral arteries: Proximal right subclavian artery plaque but no significant stenosis. The right vertebral origin is spared. The right vertebral artery is tortuous throughout the neck but patent to the skull base without stenosis. Bulky calcified plaque in  the proximal left subclavian artery. Subsequent stenosis is roughly 50%. The left vertebral artery origin is largely spared. The left vertebral is mildly dominant and patent to the skull base without stenosis. CTA HEAD Posterior circulation: Bilateral V4 segment calcified plaque without significant stenosis. The left V4 segment is dominant. Normal bilateral PICA origins. Patent vertebrobasilar junction and basilar artery without stenosis. Normal SCA and PCA origins. Posterior communicating arteries are diminutive or absent. Bilateral PCA branches are stable and within normal limits. Anterior circulation: Both ICA siphons are patent but heavily calcified. On the right there is up to moderate stenosis in the supraclinoid segment. On the left there is moderate cavernous and supraclinoid stenosis. Patent carotid termini. Normal MCA and ACA origins. Anterior communicating artery and bilateral ACA branches are within normal limits aside from calcified atherosclerosis at the distal left ACA A2 or a 3 branch point (series 12, image 26), new since 2016. Right MCA M1 segment, bifurcation, and right MCA branches are stable and within normal  limits. The left MCA M1 and left MCA trifurcation are patent without stenosis. No M2 occlusion is identified. But a posterior M3 branch of the middle M2 division is occluded as seen on series 12, image 35 and was patent on the 2016 CTA. Venous sinuses: Patent. Anatomic variants: Dominant left vertebral artery. Review of the MIP images confirms the above findings IMPRESSION: 1. Negative for LVO but positive for Left MCA M3 branch occlusion, and CTP detects 12 mL of ischemic penumbra in the corresponding posterior left MCA territory. No infarct core detected. This was reviewed by telephone with Dr. Roland Rack on 04/18/2018 at 0820 hours. 2. Extensive chronic large vessel calcified atherosclerosis in the head and neck, and Aortic atherosclerosis (ICD10-I70.0). - moderate stenoses of the bilateral ICA siphons. - no hemodynamically significant cervical ICA or vertebral artery stenosis. - up to 50% stenosis of multiple proximal great vessels. Electronically Signed   By: Genevie Ann M.D.   On: 04/18/2018 08:36   Dg Chest 2 View  Result Date: 04/10/2018 CLINICAL DATA:  Acute shortness of breath and chest pain EXAM: CHEST - 2 VIEW COMPARISON:  03/20/2018 FINDINGS: Artifact overlies the chest. Previous median sternotomy and CABG. Previous aortic valve replacement. Cardiomegaly. Pulmonary venous hypertension with interstitial edema. Small effusions in the posterior costophrenic angles. Mild dependent pulmonary atelectasis. IMPRESSION: Congestive heart failure with interstitial edema and small effusions. Electronically Signed   By: Nelson Chimes M.D.   On: 04/10/2018 12:29   Ct Head Wo Contrast  Result Date: 04/10/2018 CLINICAL DATA:  Severe headache. EXAM: CT HEAD WITHOUT CONTRAST TECHNIQUE: Contiguous axial images were obtained from the base of the skull through the vertex without intravenous contrast. COMPARISON:  CT head dated December 11, 2017. FINDINGS: Brain: No evidence of acute infarction, hemorrhage,  hydrocephalus, extra-axial collection or mass lesion/mass effect. Unchanged left frontal encephalomalacia. Stable mild atrophy and chronic microvascular ischemic changes. Vascular: Calcified atherosclerosis at the skullbase. No hyperdense vessel. Skull: Negative for fracture or focal abnormality. Sinuses/Orbits: No acute finding. Unchanged partial opacification of the left mastoid air cells. Other: None. IMPRESSION: 1.  No acute intracranial abnormality. 2. Stable mild atrophy and chronic microvascular ischemic changes. Unchanged old left frontal infarct. Electronically Signed   By: Titus Dubin M.D.   On: 04/10/2018 14:59   Ct Angio Neck W Or Wo Contrast  Result Date: 04/18/2018 CLINICAL DATA:  82 year old female code stroke, right facial droop. EXAM: CT ANGIOGRAPHY HEAD AND NECK CT PERFUSION BRAIN TECHNIQUE: Multidetector CT imaging of the  head and neck was performed using the standard protocol during bolus administration of intravenous contrast. Multiplanar CT image reconstructions and MIPs were obtained to evaluate the vascular anatomy. Carotid stenosis measurements (when applicable) are obtained utilizing NASCET criteria, using the distal internal carotid diameter as the denominator. Multiphase CT imaging of the brain was performed following IV bolus contrast injection. Subsequent parametric perfusion maps were calculated using RAPID software. CONTRAST:  182mL ISOVUE-370 IOPAMIDOL (ISOVUE-370) INJECTION 76% COMPARISON:  CT head without contrast 0753 hours today. Prior CTA head and neck 05/30/2014. FINDINGS: CT Brain Perfusion Findings: CBF (<30%) Volume: 70mL Perfusion (Tmax>6.0s) volume: 46mL Mismatch Volume: 42mL Infarction Location:Penumbra location is in the posterior left MCA territory CTA NECK Skeleton: Absent dentition. Prior sternotomy. Osteopenia. Cervical spine degeneration. No acute osseous abnormality identified. Upper chest: Chronic subpleural reticular opacity +/-paraseptal emphysema in  both upper lungs. Superior mediastinal lymph nodes are within normal limits. Other neck: Stable and negative; atrophied or absent left submandibular gland as before. Aortic arch: Prior CABG. Extensive Calcified aortic atherosclerosis. 3 vessel arch configuration. Right carotid system: Bulky calcified plaque at the brachiocephalic origin and proximal segment with up to 50% stenosis. Mild plaque at the right CCA origin without stenosis. Tortuous proximal right CCA. Calcified plaque at the right carotid bifurcation and proximal right ICA. No significant cervical right ICA stenosis. Mild tortuosity. Left carotid system: Calcified plaque at the left CCA origin does not appear significant. Intermittent calcified plaque proximal to the bifurcation without stenosis. Bulky calcified plaque at the left ICA origin is stable since 2016 with less than 50 % stenosis with respect to the distal vessel. Tortuous cervical left ICA. Vertebral arteries: Proximal right subclavian artery plaque but no significant stenosis. The right vertebral origin is spared. The right vertebral artery is tortuous throughout the neck but patent to the skull base without stenosis. Bulky calcified plaque in the proximal left subclavian artery. Subsequent stenosis is roughly 50%. The left vertebral artery origin is largely spared. The left vertebral is mildly dominant and patent to the skull base without stenosis. CTA HEAD Posterior circulation: Bilateral V4 segment calcified plaque without significant stenosis. The left V4 segment is dominant. Normal bilateral PICA origins. Patent vertebrobasilar junction and basilar artery without stenosis. Normal SCA and PCA origins. Posterior communicating arteries are diminutive or absent. Bilateral PCA branches are stable and within normal limits. Anterior circulation: Both ICA siphons are patent but heavily calcified. On the right there is up to moderate stenosis in the supraclinoid segment. On the left there is  moderate cavernous and supraclinoid stenosis. Patent carotid termini. Normal MCA and ACA origins. Anterior communicating artery and bilateral ACA branches are within normal limits aside from calcified atherosclerosis at the distal left ACA A2 or a 3 branch point (series 12, image 26), new since 2016. Right MCA M1 segment, bifurcation, and right MCA branches are stable and within normal limits. The left MCA M1 and left MCA trifurcation are patent without stenosis. No M2 occlusion is identified. But a posterior M3 branch of the middle M2 division is occluded as seen on series 12, image 35 and was patent on the 2016 CTA. Venous sinuses: Patent. Anatomic variants: Dominant left vertebral artery. Review of the MIP images confirms the above findings IMPRESSION: 1. Negative for LVO but positive for Left MCA M3 branch occlusion, and CTP detects 12 mL of ischemic penumbra in the corresponding posterior left MCA territory. No infarct core detected. This was reviewed by telephone with Dr. Roland Rack on 04/18/2018 at 0820 hours. 2.  Extensive chronic large vessel calcified atherosclerosis in the head and neck, and Aortic atherosclerosis (ICD10-I70.0). - moderate stenoses of the bilateral ICA siphons. - no hemodynamically significant cervical ICA or vertebral artery stenosis. - up to 50% stenosis of multiple proximal great vessels. Electronically Signed   By: Genevie Ann M.D.   On: 04/18/2018 08:36   Ct Cerebral Perfusion W Contrast  Result Date: 04/18/2018 CLINICAL DATA:  82 year old female code stroke, right facial droop. EXAM: CT ANGIOGRAPHY HEAD AND NECK CT PERFUSION BRAIN TECHNIQUE: Multidetector CT imaging of the head and neck was performed using the standard protocol during bolus administration of intravenous contrast. Multiplanar CT image reconstructions and MIPs were obtained to evaluate the vascular anatomy. Carotid stenosis measurements (when applicable) are obtained utilizing NASCET criteria, using the  distal internal carotid diameter as the denominator. Multiphase CT imaging of the brain was performed following IV bolus contrast injection. Subsequent parametric perfusion maps were calculated using RAPID software. CONTRAST:  177mL ISOVUE-370 IOPAMIDOL (ISOVUE-370) INJECTION 76% COMPARISON:  CT head without contrast 0753 hours today. Prior CTA head and neck 05/30/2014. FINDINGS: CT Brain Perfusion Findings: CBF (<30%) Volume: 47mL Perfusion (Tmax>6.0s) volume: 90mL Mismatch Volume: 32mL Infarction Location:Penumbra location is in the posterior left MCA territory CTA NECK Skeleton: Absent dentition. Prior sternotomy. Osteopenia. Cervical spine degeneration. No acute osseous abnormality identified. Upper chest: Chronic subpleural reticular opacity +/-paraseptal emphysema in both upper lungs. Superior mediastinal lymph nodes are within normal limits. Other neck: Stable and negative; atrophied or absent left submandibular gland as before. Aortic arch: Prior CABG. Extensive Calcified aortic atherosclerosis. 3 vessel arch configuration. Right carotid system: Bulky calcified plaque at the brachiocephalic origin and proximal segment with up to 50% stenosis. Mild plaque at the right CCA origin without stenosis. Tortuous proximal right CCA. Calcified plaque at the right carotid bifurcation and proximal right ICA. No significant cervical right ICA stenosis. Mild tortuosity. Left carotid system: Calcified plaque at the left CCA origin does not appear significant. Intermittent calcified plaque proximal to the bifurcation without stenosis. Bulky calcified plaque at the left ICA origin is stable since 2016 with less than 50 % stenosis with respect to the distal vessel. Tortuous cervical left ICA. Vertebral arteries: Proximal right subclavian artery plaque but no significant stenosis. The right vertebral origin is spared. The right vertebral artery is tortuous throughout the neck but patent to the skull base without stenosis. Bulky  calcified plaque in the proximal left subclavian artery. Subsequent stenosis is roughly 50%. The left vertebral artery origin is largely spared. The left vertebral is mildly dominant and patent to the skull base without stenosis. CTA HEAD Posterior circulation: Bilateral V4 segment calcified plaque without significant stenosis. The left V4 segment is dominant. Normal bilateral PICA origins. Patent vertebrobasilar junction and basilar artery without stenosis. Normal SCA and PCA origins. Posterior communicating arteries are diminutive or absent. Bilateral PCA branches are stable and within normal limits. Anterior circulation: Both ICA siphons are patent but heavily calcified. On the right there is up to moderate stenosis in the supraclinoid segment. On the left there is moderate cavernous and supraclinoid stenosis. Patent carotid termini. Normal MCA and ACA origins. Anterior communicating artery and bilateral ACA branches are within normal limits aside from calcified atherosclerosis at the distal left ACA A2 or a 3 branch point (series 12, image 26), new since 2016. Right MCA M1 segment, bifurcation, and right MCA branches are stable and within normal limits. The left MCA M1 and left MCA trifurcation are patent without stenosis. No M2 occlusion  is identified. But a posterior M3 branch of the middle M2 division is occluded as seen on series 12, image 35 and was patent on the 2016 CTA. Venous sinuses: Patent. Anatomic variants: Dominant left vertebral artery. Review of the MIP images confirms the above findings IMPRESSION: 1. Negative for LVO but positive for Left MCA M3 branch occlusion, and CTP detects 12 mL of ischemic penumbra in the corresponding posterior left MCA territory. No infarct core detected. This was reviewed by telephone with Dr. Roland Rack on 04/18/2018 at 0820 hours. 2. Extensive chronic large vessel calcified atherosclerosis in the head and neck, and Aortic atherosclerosis (ICD10-I70.0). -  moderate stenoses of the bilateral ICA siphons. - no hemodynamically significant cervical ICA or vertebral artery stenosis. - up to 50% stenosis of multiple proximal great vessels. Electronically Signed   By: Genevie Ann M.D.   On: 04/18/2018 08:36   Dg Bone Density (dxa)  Result Date: 03/28/2018 EXAM: DUAL X-RAY ABSORPTIOMETRY (DXA) FOR BONE MINERAL DENSITY IMPRESSION: Referring Physician:  Marion Your patient completed a BMD test using Lunar IDXA DXA system ( analysis version: 16 ) manufactured by [employer]. Technologist: KT PATIENT: Name: Arcenia, Scarbro Patient ID: [id number] Birth Date: October 01, 1935 Height: 64.5 in. Sex: Female Measured: 03/28/2018 Weight: 182.0 lbs. Indications: Advanced Age, Caucasian, Estrogen Deficient, Height Loss (781.91), History of Fracture (Adult) (V15.51), Postmenopausal, Rheumatoid Arthritis (714.0) Fractures: Fibula, Foot, Humerus Treatments: None ASSESSMENT: The BMD measured at Femur Neck Right is 0.852 g/cm2 with a T-score of -1.3. This patient is considered OSTEOPENIC according to [facility] [facility]) criteria. The scan quality is good. L-3 and L-4 were excluded due to degenerative changes. Site Region Measured Date Measured Age YA T-score BMD Significant CHANGE DualFemur Neck Right 03/28/2018    80.7         -1.3    0.852 g/cm2 AP Spine  L1-L2      5/28/2018    80.7         0.6     1.234 g/cm2 DualFemur Total Mean 03/28/2018    80.7         -0.9    0.889 g/cm2 World Health Organization [facility]) criteria for post-menopausal, Caucasian Women: Normal       T-score at or above -1 SD Osteopenia   T-score between -1 and -2.5 SD Osteoporosis T-score at or below -2.5 SD RECOMMENDATION: 1. All patients should optimize calcium and vitamin D intake. 2. Consider FDA approved medical therapies in postmenopausal women and men aged 60 years and older, based on the following: a. A hip or vertebral (clinical or morphometric) fracture b. T- score < or = -2.5 at the  femoral neck or spine after appropriate evaluation to exclude secondary causes c. Low bone mass (T-score between -1.0 and -2.5 at the femoral neck or spine) and a 10 year probability of a hip fracture > or = 3% or a 10 year probability of a major osteoporosis-related fracture > or = 20% based on the [place]-adapted WHO algorithm d. Clinician judgment and/or patient preferences may indicate treatment for people with 10-year fracture probabilities above or below these levels FOLLOW-UP: People with diagnosed cases of osteoporosis or at high risk for fracture should have regular bone mineral density tests. For patients eligible for Medicare, routine testing is allowed once every 2 years. The testing frequency can be increased to one year for patients who have rapidly progressing disease, those who are receiving or discontinuing medical therapy to restore bone mass, or have additional  risk factors. I have reviewed this report and agree with the above findings. Mark A. Thornton Papas, M.D. [facility] FRAX* 10-year Probability of Fracture Based on femoral neck BMD: DualFemur (Right) Major Osteoporotic Fracture: 23.0% Hip Fracture:                5.1% Population:                  [place] (Caucasian) Risk Factors: History of Fracture (Adult) (V15.51), Rheumatoid Arthritis (714.0) *FRAX is a Materials engineer of the [employer] for Metabolic Bone Disease, a [place] (WHO) [facility]. ASSESSMENT: The probability of a major osteoporotic fracture is 23.0 % within the next ten years. The probability of hip fracture is  5.1  % within the next 10 years. I have reviewed this report and agree with the above findings. Mark A. Thornton Papas, M.D. [facility] Electronically Signed   By: Lavonia Dana M.D.   On: 03/28/2018 15:54   Dg Chest Port 1 View  Result Date: 03/20/2018 CLINICAL DATA:  Shortness of breath. EXAM: PORTABLE CHEST 1 VIEW COMPARISON:  Chest x-ray dated March 13, 2018.  FINDINGS: Stable cardiomegaly status post TAVR and CABG. The lungs remain hyperinflated with chronic interstitial changes. Progressive diffuse interstitial thickening throughout both lungs. No focal consolidation, pneumothorax, or large pleural effusion. No acute osseous abnormality. IMPRESSION: Worsening pulmonary interstitial edema. Electronically Signed   By: Titus Dubin M.D.   On: 03/20/2018 17:58   Dg Foot Complete Right  Result Date: 04/11/2018 CLINICAL DATA:  Diabetic ulcer RIGHT foot EXAM: RIGHT FOOT COMPLETE - 3+ VIEW COMPARISON:  03/13/2018 FINDINGS: Soft tissue ulceration in the lateral aspect of the forefoot adjacent to the fifth metatarsal phalangeal joint. No evidence of osseous erosion to suggest osteomyelitis. Two needle tips are in the soft tissues adjacent to the first metatarsal phalangeal joint. No change from prior. IMPRESSION: 1. Ulceration in the soft tissues adjacent to the fifth metatarsal phalangeal joint. 2. No evidence of osteomyelitis. 3. Chronic needle-like foreign bodies adjacent to the first metatarsal phalangeal joint. Electronically Signed   By: Suzy Bouchard M.D.   On: 04/11/2018 16:45   Ct Head Code Stroke Wo Contrast  Result Date: 04/18/2018 CLINICAL DATA:  Code stroke. 82 year old female with right facial droop last seen normal 1730 hours yesterday. EXAM: CT HEAD WITHOUT CONTRAST TECHNIQUE: Contiguous axial images were obtained from the base of the skull through the vertex without intravenous contrast. COMPARISON:  04/10/2018 and earlier. FINDINGS: Brain: Left frontal operculum and middle frontal gyrus encephalomalacia appears stable from 1 week ago. No midline shift, mass effect, or evidence of intracranial mass lesion. No acute intracranial hemorrhage identified. No ventriculomegaly. No new cortical based infarct identified. Gray-white matter differentiation appears stable. Vascular: Extensive Calcified atherosclerosis at the skull base. No suspicious  intracranial vascular hyperdensity. Skull: No acute osseous abnormality identified. Sinuses/Orbits: Visualized paranasal sinuses and mastoids are stable and well pneumatized. Other: No acute orbit or scalp soft tissue findings. ASPECTS [facility] Stroke Program Early CT Score) - Ganglionic level infarction (caudate, lentiform nuclei, internal capsule, insula, M1-M3 cortex): 7 - Supraganglionic infarction (M4-M6 cortex): 3 Total score (0-10 with 10 being normal): 10 IMPRESSION: 1. Left frontal operculum and middle frontal gyrus encephalomalacia appears stable from 1 week ago. No acute intracranial hemorrhage or acute cortically based infarct identified. 2. ASPECTS is 10. 3. These results were communicated to Dr. Leonel Ramsay at [place] 12/31/2019by text page via the [facility] messaging system. Electronically Signed   By:  Genevie Ann M.D.   On: 04/18/2018 08:15    EKG EKG Interpretation  Date/Time:  Tuesday April 18 2018 08:18:17 EST Ventricular Rate:  83 PR Interval:    QRS Duration: 120 QT Interval:  386 QTC Calculation: 675 R Axis:   111 Text Interpretation:  Atrial fibrillation Nonspecific intraventricular conduction delay Probable anteroseptal infarct, old Borderline repolarization abnormality No significant change since last tracing Confirmed by Orlie Dakin [phone]) on 04/18/2018 8:20:15 AM   Radiology No results found.  Procedures Procedures (including critical care time)  Medications Ordered in ED Medications - No data to display   Initial Impression / Assessment and Plan / ED Course  I have reviewed the triage vital signs and the nursing notes.  Pertinent labs & imaging results that were available during my care of the patient were reviewed by me and considered in my medical decision making (see chart for details).     Patient passed swallow screen.  Kirkpatrick from neurology evaluated patient on arrival.  Per his recommendation she is not a candidate for interventional  radiology.  He suggest discontinue Eliquis.  Start aspirin.  Aspirin 325 mg ordered by me Lab work consistent with anemia, hyperglycemia and renal insufficiency,.  Renal insufficiency and hemoglobin unchanged from 4days ago  Insulin NovoLog 10 units subcutaneously ordered by me 9 AM patient's son now present in the emergency department.  He verifies that she was last normal at 5:30 PM yesterday, and corroborates history .Marland Kitchen  At 9 AM patient's exam unchanged. NIH stroke scale calculated at 20 by stroke team Consulted Dr. Lorin Mercy from hospitalist service who will arrange for admission  Final Clinical Impressions(s) / ED Diagnoses  Diagnosis #1 acute ischemic stroke #2 hyperglycemia #3 renal insufficiency #4 anemia # 5 foot ulcer CRITICAL CARE Performed by: Orlie Dakin Total critical care time: 35 minutes Critical care time was exclusive of separately billable procedures and treating other patients. Critical care was necessary to treat or prevent imminent or life-threatening deterioration. Critical care was time spent personally by me on the following activities: development of treatment plan with patient and/or surrogate as well as nursing, discussions with consultants, evaluation of patient's response to treatment, examination of patient, obtaining history from patient or surrogate, ordering and performing treatments and interventions, ordering and review of laboratory studies, ordering and review of radiographic studies, pulse oximetry and re-evaluation of patient's condition. Final diagnoses:  None    ED Discharge Orders    None       Orlie Dakin, MD 04/18/18 [id number]    Orlie Dakin, MD 04/18/18 [id number]    Orlie Dakin, MD 04/18/18 [id number]

## 2018-04-19 DIAGNOSIS — E1151 Type 2 diabetes mellitus with diabetic peripheral angiopathy without gangrene: Secondary | ICD-10-CM

## 2018-04-19 DIAGNOSIS — I639 Cerebral infarction, unspecified: Secondary | ICD-10-CM

## 2018-04-19 DIAGNOSIS — D72829 Elevated white blood cell count, unspecified: Secondary | ICD-10-CM

## 2018-04-19 DIAGNOSIS — I63412 Cerebral infarction due to embolism of left middle cerebral artery: Principal | ICD-10-CM

## 2018-04-19 DIAGNOSIS — D62 Acute posthemorrhagic anemia: Secondary | ICD-10-CM

## 2018-04-19 DIAGNOSIS — I482 Chronic atrial fibrillation, unspecified: Secondary | ICD-10-CM

## 2018-04-19 DIAGNOSIS — M316 Other giant cell arteritis: Secondary | ICD-10-CM

## 2018-04-19 DIAGNOSIS — Z952 Presence of prosthetic heart valve: Secondary | ICD-10-CM

## 2018-04-19 DIAGNOSIS — I63512 Cerebral infarction due to unspecified occlusion or stenosis of left middle cerebral artery: Secondary | ICD-10-CM

## 2018-04-19 DIAGNOSIS — L97513 Non-pressure chronic ulcer of other part of right foot with necrosis of muscle: Secondary | ICD-10-CM

## 2018-04-19 DIAGNOSIS — I480 Paroxysmal atrial fibrillation: Secondary | ICD-10-CM

## 2018-04-19 DIAGNOSIS — I10 Essential (primary) hypertension: Secondary | ICD-10-CM

## 2018-04-19 DIAGNOSIS — R7309 Other abnormal glucose: Secondary | ICD-10-CM

## 2018-04-19 DIAGNOSIS — N183 Chronic kidney disease, stage 3 (moderate): Secondary | ICD-10-CM

## 2018-04-19 LAB — CBC
HCT: 27.7 % — ABNORMAL LOW (ref 36.0–46.0)
Hemoglobin: 8.8 g/dL — ABNORMAL LOW (ref 12.0–15.0)
MCH: 27.6 pg (ref 26.0–34.0)
MCHC: 31.8 g/dL (ref 30.0–36.0)
MCV: 86.8 fL (ref 80.0–100.0)
NRBC: 0 % (ref 0.0–0.2)
Platelets: 154 10*3/uL (ref 150–400)
RBC: 3.19 MIL/uL — ABNORMAL LOW (ref 3.87–5.11)
RDW: 20 % — AB (ref 11.5–15.5)
WBC: 13.5 10*3/uL — ABNORMAL HIGH (ref 4.0–10.5)

## 2018-04-19 LAB — GLUCOSE, CAPILLARY
GLUCOSE-CAPILLARY: 79 mg/dL (ref 70–99)
GLUCOSE-CAPILLARY: 192 mg/dL — AB (ref 70–99)
Glucose-Capillary: 154 mg/dL — ABNORMAL HIGH (ref 70–99)
Glucose-Capillary: 132 mg/dL — ABNORMAL HIGH (ref 70–99)

## 2018-04-19 LAB — BASIC METABOLIC PANEL
Anion gap: 7 (ref 5–15)
BUN: 49 mg/dL — ABNORMAL HIGH (ref 8–23)
CO2: 21 mmol/L — ABNORMAL LOW (ref 22–32)
CREATININE: 1.36 mg/dL — AB (ref 0.44–1.00)
Calcium: 8.1 mg/dL — ABNORMAL LOW (ref 8.9–10.3)
Chloride: 110 mmol/L (ref 98–111)
GFR calc Af Amer: 42 mL/min — ABNORMAL LOW (ref >60)
GFR calc non Af Amer: 37 mL/min — ABNORMAL LOW (ref >60)
Glucose, Bld: 92 mg/dL (ref 70–99)
Potassium: 4 mmol/L (ref 3.5–5.1)
Sodium: 138 mmol/L (ref 135–145)

## 2018-04-19 LAB — LIPID PANEL
Cholesterol: 150 mg/dL (ref 0–200)
HDL: 45 mg/dL (ref >40)
LDL Cholesterol: 89 mg/dL (ref 0–99)
Total CHOL/HDL Ratio: 3.3 RATIO
Triglycerides: 80 mg/dL (ref <150)
VLDL: 16 mg/dL (ref 0–40)

## 2018-04-19 MED ORDER — HYDROCODONE-ACETAMINOPHEN 5-325 MG PO TABS
2.0000 | ORAL_TABLET | Freq: Once | ORAL | Status: AC
  Administered 2018-04-19: 2 via ORAL
  Filled 2018-04-19: qty 2

## 2018-04-19 MED ORDER — APIXABAN 5 MG PO TABS
5.0000 mg | ORAL_TABLET | Freq: Two times a day (BID) | ORAL | Status: DC
  Administered 2018-04-19 – 2018-04-21 (×4): 5 mg via ORAL
  Filled 2018-04-19 (×4): qty 1

## 2018-04-19 MED ORDER — PREDNISONE 50 MG PO TABS
50.0000 mg | ORAL_TABLET | Freq: Every day | ORAL | Status: DC
  Administered 2018-04-20 – 2018-04-21 (×2): 50 mg via ORAL
  Filled 2018-04-19 (×3): qty 1

## 2018-04-19 NOTE — Consult Note (Signed)
Physical Medicine and Rehabilitation Consult Reason for Consult: Stroke Referring Physician: Karmen Bongo, MD   HPI: Kristina Summers is a 83 y.o. female with past medical history of diabetes mellitus type 2, PVD, PAF, obesity, hypertension, CVA in 2016, fibromyalgia, diastolic congestive heart failure, CKD, CAD, status post TAVR, right leg ulcer presented on 04/18/2018 with left MCA infarct.  History taken from chart review.  She was found by her son to have right facial weakness, aphonia, and right-sided weakness.  Of note, patient was in the hospital in November for CHF exacerbation, again in December for CHF exacerbation, again a few weeks later for CHF exacerbation with AKI.  She was also complaining of headaches and underwent temporal artery biopsy on 12/26, which was negative.  At baseline, patient ambulates with Rollator.  Neurology was consulted work-up initiated.  MRI ordered, reviewed showing multiple left infarcts.  Per report, multiple small cortical infarcts scattered in left MCA territory compatible with sequela of left M3 branch occlusion with superimposed chronic encephalomalacia in left MCA territory.  Stroke work-up initiated.  Echocardiogram performed, showing EF 50-55%.  Foot x-rays ordered negative for osteomyelitis.  Rocephin and Flagyl started for cellulitis.  Hospital course further complicated by labile blood glucose, leukocytosis, acute blood loss anemia.   Review of Systems  Unable to perform ROS: Mental acuity   Past Medical History:  Diagnosis Date  . Anemia   . CAD (coronary artery disease)    a. s/p CABG in 2000  . Carotid artery occlusion   . CKD (chronic kidney disease)   . Diastolic CHF ([place]) 83/4196  . Fibromyalgia   . GERD (gastroesophageal reflux disease)   . History of CVA (cerebrovascular accident) 05/24/2014  . History of GI bleed   . Hyperlipidemia   . Hypertension   . Obesity   . PAF (paroxysmal atrial fibrillation) (HCC)    a. on Eliquis    . Peripheral vascular disease ([place])   . 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 Type II diabetes mellitus ([place]) dx'd 1979   Past Surgical History:  Procedure Laterality Date  . ABDOMINAL AORTAGRAM N/A 04/01/2011   Procedure: ABDOMINAL AORTAGRAM;  Surgeon: Conrad Sewaren, MD;  Location: [facility] CATH LAB;  Service: Cardiovascular;  Laterality: N/A;  . ANGIOPLASTY  06/17/11   Left leg common femoral artery cannulation under u/s Left leg runoff  . ARTERY BIOPSY Right 04/13/2018   Procedure: BIOPSY TEMPORAL ARTERY RIGHT;  Surgeon: Rosetta Posner, MD;  Location: [place];  Service: Vascular;  Laterality: Right;  . CARDIAC CATHETERIZATION  10/11/2017  . CARPAL TUNNEL RELEASE Right   . CATARACT EXTRACTION W/ INTRAOCULAR LENS  IMPLANT, BILATERAL  2004-2005  . CORONARY ARTERY BYPASS GRAFT  2000   CABG X5  . ESOPHAGOGASTRODUODENOSCOPY (EGD) WITH PROPOFOL N/A 12/25/2017   Procedure: ESOPHAGOGASTRODUODENOSCOPY (EGD) WITH PROPOFOL;  Surgeon: Wonda Horner, MD;  Location: [facility] ENDOSCOPY;  Service: Endoscopy;  Laterality: N/A;  . EYE SURGERY Left    "lasered before cataract OR"  . GIVENS CAPSULE STUDY  03/14/2018  . GIVENS CAPSULE STUDY N/A 03/14/2018   Procedure: GIVENS CAPSULE STUDY;  Surgeon: Ronald Lobo, MD;  Location: [place];  Service: Endoscopy;  Laterality: N/A;  . LOWER EXTREMITY ANGIOGRAM Bilateral 04/01/2011   Procedure: LOWER EXTREMITY ANGIOGRAM;  Surgeon: Conrad Hunt, MD;  Location: [facility] CATH LAB;  Service: Cardiovascular;  Laterality: Bilateral;  .  LOWER EXTREMITY ANGIOGRAM Left 06/17/2011   Procedure: LOWER EXTREMITY ANGIOGRAM;  Surgeon: Conrad B[place], MD;  Location: [facility] CATH LAB;  Service: Cardiovascular;  Laterality: Left;  . LOWER EXTREMITY ANGIOGRAM N/A 11/18/2011   Procedure: LOWER EXTREMITY ANGIOGRAM;  Surgeon: Conrad [place], MD;  Location: [facility] CATH LAB;  Service:  Cardiovascular;  Laterality: N/A;  . PERCUTANEOUS STENT INTERVENTION Right 04/01/2011   Procedure: PERCUTANEOUS STENT INTERVENTION;  Surgeon: Conrad [place], MD;  Location: [facility] CATH LAB;  Service: Cardiovascular;  Laterality: Right;  rt iliac stent  . PERIPHERAL ARTERIAL STENT GRAFT  04/01/11   right common iliac  . RIGHT/LEFT HEART CATH AND CORONARY/GRAFT ANGIOGRAPHY N/A 10/11/2017   Procedure: RIGHT/LEFT HEART CATH AND CORONARY/GRAFT ANGIOGRAPHY;  Surgeon: Sherren Mocha, MD;  Location: [place] CV LAB;  Service: Cardiovascular;  Laterality: N/A;  . TEE WITHOUT CARDIOVERSION N/A 11/08/2017   Procedure: TRANSESOPHAGEAL ECHOCARDIOGRAM (TEE);  Surgeon: Sherren Mocha, MD;  Location: [place];  Service: Open Heart Surgery;  Laterality: N/A;  . TEE WITHOUT CARDIOVERSION N/A 12/15/2017   Procedure: TRANSESOPHAGEAL ECHOCARDIOGRAM (TEE);  Surgeon: Larey Dresser, MD;  Location: [facility] ENDOSCOPY;  Service: Cardiovascular;  Laterality: N/A;  . TRANSCATHETER AORTIC VALVE REPLACEMENT, TRANSFEMORAL N/A 11/08/2017   Procedure: TRANSCATHETER AORTIC VALVE REPLACEMENT, TRANSFEMORAL;  Surgeon: Sherren Mocha, MD;  Location: [place];  Service: Open Heart Surgery;  Laterality: N/A;  . TRIGGER FINGER RELEASE Left 1996   thumb   Family History  Problem Relation Age of Onset  . Other Brother        intestinal blockage  . Diabetes Brother    Social History:  reports that she quit smoking about 28 years ago. Her smoking use included cigarettes. She has a 60.00 pack-year smoking history. She has never used smokeless tobacco. She reports previous alcohol use. She reports that she does not use drugs. Allergies:  Allergies  Allergen Reactions  . Morphine And Related Nausea And Vomiting and Other (See Comments)    Chest pain, also   . Lisinopril Cough  . Zoloft [Sertraline Hcl] Nausea Only        Medications Prior to Admission  Medication Sig Dispense Refill  . acetaminophen (TYLENOL) 500 MG tablet Take 500 mg by  mouth daily as needed for moderate pain or headache.    Marland Kitchen amLODipine (NORVASC) 10 MG tablet Take 10 mg by mouth daily.     Marland Kitchen apixaban (ELIQUIS) 2.5 MG TABS tablet Take 1 tablet (2.5 mg total) by mouth 2 (two) times daily. 60 tablet 0  . atorvastatin (LIPITOR) 80 MG tablet Take 1 tablet (80 mg total) by mouth daily. (Patient taking differently: Take by mouth daily at 6 PM. ) 30 tablet 0  . doxycycline (VIBRA-TABS) 100 MG tablet Take 1 tablet (100 mg total) by mouth daily for 10 days. 10 tablet 0  . furosemide (LASIX) 40 MG tablet Take 1 tablet (40 mg total) by mouth daily. Start tomorrow 12/6 30 tablet 1  . glucose 4 GM chewable tablet Chew 1-4 tablets by mouth once as needed for low blood sugar.     . insulin aspart (NOVOLOG) 100 UNIT/ML injection Inject 8 Units into the skin 3 (three) times daily before meals.    . insulin NPH Human (HUMULIN N,NOVOLIN N) 100 UNIT/ML injection Inject 0.18 mLs (18 Units total) into the skin daily before breakfast. (Patient taking differently: Inject 18 Units into the skin at bedtime. ) 30 mL 0  . insulin NPH Human (HUMULIN N,NOVOLIN N) 100 UNIT/ML injection Inject  0.12 mLs (12 Units total) into the skin daily before supper. (Patient taking differently: Inject 12 Units into the skin daily before breakfast. ) 30 mL 0  . metoprolol tartrate (LOPRESSOR) 25 MG tablet Take 1 tablet (25 mg total) by mouth 2 (two) times daily. 60 tablet 0  . Multiple Vitamin (MULTIVITAMIN WITH MINERALS) TABS tablet Take 1 tablet by mouth daily. One a Day 50 plus    . nitroGLYCERIN (NITROSTAT) 0.4 MG SL tablet Place 0.4 mg under the tongue every 5 (five) minutes as needed for chest pain.    . pantoprazole (PROTONIX) 40 MG tablet Take 1 tablet (40 mg total) by mouth daily for 30 doses. 30 tablet 0  . predniSONE (DELTASONE) 50 MG tablet Take 1 tablet 50 mg daily x2 weeks Then taper down to 40 mg daily x2 weeks Then taper again according to your provider Please follow-up with your provider/PCP  within a week 60 tablet 0  . senna (SENOKOT) 8.6 MG TABS tablet Take 1 tablet by mouth as needed for mild constipation.    . nutrition supplement, JUVEN, (JUVEN) PACK Take 1 packet by mouth 2 (two) times daily between meals. 30 packet 0    Home:    Functional History:   Functional Status:  Mobility:          ADL:    Cognition: Cognition Orientation Level: Oriented X4    Blood pressure (!) 163/76, pulse 84, temperature (!) 97.5 F (36.4 C), temperature source Oral, resp. rate 17, SpO2 95 %. Physical Exam  Vitals reviewed. Constitutional: She appears well-developed.  Obese  HENT:  Head: Normocephalic and atraumatic.  Eyes: EOM are normal. Right eye exhibits no discharge. Left eye exhibits no discharge.  Neck: Normal range of motion. Neck supple.  Cardiovascular:  Irregularly irregular  Respiratory: Effort normal and breath sounds normal.  GI: Soft. Bowel sounds are normal.  Musculoskeletal:     Comments: No edema or tenderness in extremities  Neurological: She is alert.  Expressive >>Receptive aphasia Motor: Slightly limited by ability to follow commands RUE: 4/5 proximal to distal LUE: 4+/5 proximal to distal RLE: 4/5 proximal to distal LLE: 4-4+/5 proximal to distal  Skin:  Left foot ulcer with dressing  Psychiatric:  Unable to assess due to aphasia, but appears to have normal mood and behavior    Results for orders placed or performed during the hospital encounter of 04/18/18 (from the past 24 hour(s))  CBG monitoring, ED     Status: Abnormal   Collection Time: 04/18/18 11:55 AM  Result Value Ref Range   Glucose-Capillary 275 (H) 70 - 99 mg/dL  Glucose, capillary     Status: Abnormal   Collection Time: 04/18/18  4:07 PM  Result Value Ref Range   Glucose-Capillary 296 (H) 70 - 99 mg/dL   Comment 1 Notify RN    Comment 2 Document in Chart   Glucose, capillary     Status: Abnormal   Collection Time: 04/18/18  4:45 PM  Result Value Ref Range    Glucose-Capillary 302 (H) 70 - 99 mg/dL  Sedimentation rate     Status: Abnormal   Collection Time: 04/18/18  5:05 PM  Result Value Ref Range   Sed Rate 95 (H) 0 - 22 mm/hr  C-reactive protein     Status: Abnormal   Collection Time: 04/18/18  5:05 PM  Result Value Ref Range   CRP 4.6 (H) <1.0 mg/dL  Prealbumin     Status: Abnormal   Collection Time: 04/18/18  5:05 PM  Result Value Ref Range   Prealbumin 11.6 (L) 18 - 38 mg/dL  Glucose, capillary     Status: Abnormal   Collection Time: 04/18/18  9:25 PM  Result Value Ref Range   Glucose-Capillary 218 (H) 70 - 99 mg/dL   Comment 1 Notify RN    Comment 2 Document in Chart   Lipid panel     Status: None   Collection Time: 04/19/18  4:27 AM  Result Value Ref Range   Cholesterol 150 0 - 200 mg/dL   Triglycerides 80 <150 mg/dL   HDL 45 >40 mg/dL   Total CHOL/HDL Ratio 3.3 RATIO   VLDL 16 0 - 40 mg/dL   LDL Cholesterol 89 0 - 99 mg/dL  Glucose, capillary     Status: None   Collection Time: 04/19/18  6:23 AM  Result Value Ref Range   Glucose-Capillary 79 70 - 99 mg/dL   Comment 1 Notify RN    Comment 2 Document in Chart   Basic metabolic panel     Status: Abnormal   Collection Time: 04/19/18  7:29 AM  Result Value Ref Range   Sodium 138 135 - 145 mmol/L   Potassium 4.0 3.5 - 5.1 mmol/L   Chloride 110 98 - 111 mmol/L   CO2 21 (L) 22 - 32 mmol/L   Glucose, Bld 92 70 - 99 mg/dL   BUN 49 (H) 8 - 23 mg/dL   Creatinine, Ser 1.36 (H) 0.44 - 1.00 mg/dL   Calcium 8.1 (L) 8.9 - 10.3 mg/dL   GFR calc non Af Amer 37 (L) >60 mL/min   GFR calc Af Amer 42 (L) >60 mL/min   Anion gap 7 5 - 15  CBC     Status: Abnormal   Collection Time: 04/19/18  7:29 AM  Result Value Ref Range   WBC 13.5 (H) 4.0 - 10.5 K/uL   RBC 3.19 (L) 3.87 - 5.11 MIL/uL   Hemoglobin 8.8 (L) 12.0 - 15.0 g/dL   HCT 27.7 (L) 36.0 - 46.0 %   MCV 86.8 80.0 - 100.0 fL   MCH 27.6 26.0 - 34.0 pg   MCHC 31.8 30.0 - 36.0 g/dL   RDW 20.0 (H) 11.5 - 15.5 %   Platelets 154  150 - 400 K/uL   nRBC 0.0 0.0 - 0.2 %   Ct Angio Head W Or Wo Contrast  Result Date: 04/18/2018 CLINICAL DATA:  83 year old female code stroke, right facial droop. EXAM: CT ANGIOGRAPHY HEAD AND NECK CT PERFUSION BRAIN TECHNIQUE: Multidetector CT imaging of the head and neck was performed using the standard protocol during bolus administration of intravenous contrast. Multiplanar CT image reconstructions and MIPs were obtained to evaluate the vascular anatomy. Carotid stenosis measurements (when applicable) are obtained utilizing NASCET criteria, using the distal internal carotid diameter as the denominator. Multiphase CT imaging of the brain was performed following IV bolus contrast injection. Subsequent parametric perfusion maps were calculated using RAPID software. CONTRAST:  157mL ISOVUE-370 IOPAMIDOL (ISOVUE-370) INJECTION 76% COMPARISON:  CT head without contrast 0753 hours today. Prior CTA head and neck 05/30/2014. FINDINGS: CT Brain Perfusion Findings: CBF (<30%) Volume: 28mL Perfusion (Tmax>6.0s) volume: 35mL Mismatch Volume: 46mL Infarction Location:Penumbra location is in the posterior left MCA territory CTA NECK Skeleton: Absent dentition. Prior sternotomy. Osteopenia. Cervical spine degeneration. No acute osseous abnormality identified. Upper chest: Chronic subpleural reticular opacity +/-paraseptal emphysema in both upper lungs. Superior mediastinal lymph nodes are within normal limits. Other neck: Stable and negative; atrophied  or absent left submandibular gland as before. Aortic arch: Prior CABG. Extensive Calcified aortic atherosclerosis. 3 vessel arch configuration. Right carotid system: Bulky calcified plaque at the brachiocephalic origin and proximal segment with up to 50% stenosis. Mild plaque at the right CCA origin without stenosis. Tortuous proximal right CCA. Calcified plaque at the right carotid bifurcation and proximal right ICA. No significant cervical right ICA stenosis. Mild  tortuosity. Left carotid system: Calcified plaque at the left CCA origin does not appear significant. Intermittent calcified plaque proximal to the bifurcation without stenosis. Bulky calcified plaque at the left ICA origin is stable since 2016 with less than 50 % stenosis with respect to the distal vessel. Tortuous cervical left ICA. Vertebral arteries: Proximal right subclavian artery plaque but no significant stenosis. The right vertebral origin is spared. The right vertebral artery is tortuous throughout the neck but patent to the skull base without stenosis. Bulky calcified plaque in the proximal left subclavian artery. Subsequent stenosis is roughly 50%. The left vertebral artery origin is largely spared. The left vertebral is mildly dominant and patent to the skull base without stenosis. CTA HEAD Posterior circulation: Bilateral V4 segment calcified plaque without significant stenosis. The left V4 segment is dominant. Normal bilateral PICA origins. Patent vertebrobasilar junction and basilar artery without stenosis. Normal SCA and PCA origins. Posterior communicating arteries are diminutive or absent. Bilateral PCA branches are stable and within normal limits. Anterior circulation: Both ICA siphons are patent but heavily calcified. On the right there is up to moderate stenosis in the supraclinoid segment. On the left there is moderate cavernous and supraclinoid stenosis. Patent carotid termini. Normal MCA and ACA origins. Anterior communicating artery and bilateral ACA branches are within normal limits aside from calcified atherosclerosis at the distal left ACA A2 or a 3 branch point (series 12, image 26), new since 2016. Right MCA M1 segment, bifurcation, and right MCA branches are stable and within normal limits. The left MCA M1 and left MCA trifurcation are patent without stenosis. No M2 occlusion is identified. But a posterior M3 branch of the middle M2 division is occluded as seen on series 12, image 35  and was patent on the 2016 CTA. Venous sinuses: Patent. Anatomic variants: Dominant left vertebral artery. Review of the MIP images confirms the above findings IMPRESSION: 1. Negative for LVO but positive for Left MCA M3 branch occlusion, and CTP detects 12 mL of ischemic penumbra in the corresponding posterior left MCA territory. No infarct core detected. This was reviewed by telephone with Dr. Roland Rack on 04/18/2018 at 0820 hours. 2. Extensive chronic large vessel calcified atherosclerosis in the head and neck, and Aortic atherosclerosis (ICD10-I70.0). - moderate stenoses of the bilateral ICA siphons. - no hemodynamically significant cervical ICA or vertebral artery stenosis. - up to 50% stenosis of multiple proximal great vessels. Electronically Signed   By: Genevie Ann M.D.   On: 04/18/2018 08:36   Ct Angio Neck W Or Wo Contrast  Result Date: 04/18/2018 CLINICAL DATA:  83 year old female code stroke, right facial droop. EXAM: CT ANGIOGRAPHY HEAD AND NECK CT PERFUSION BRAIN TECHNIQUE: Multidetector CT imaging of the head and neck was performed using the standard protocol during bolus administration of intravenous contrast. Multiplanar CT image reconstructions and MIPs were obtained to evaluate the vascular anatomy. Carotid stenosis measurements (when applicable) are obtained utilizing NASCET criteria, using the distal internal carotid diameter as the denominator. Multiphase CT imaging of the brain was performed following IV bolus contrast injection. Subsequent parametric perfusion maps were  calculated using RAPID software. CONTRAST:  16mL ISOVUE-370 IOPAMIDOL (ISOVUE-370) INJECTION 76% COMPARISON:  CT head without contrast 0753 hours today. Prior CTA head and neck 05/30/2014. FINDINGS: CT Brain Perfusion Findings: CBF (<30%) Volume: 4mL Perfusion (Tmax>6.0s) volume: 28mL Mismatch Volume: 34mL Infarction Location:Penumbra location is in the posterior left MCA territory CTA NECK Skeleton: Absent  dentition. Prior sternotomy. Osteopenia. Cervical spine degeneration. No acute osseous abnormality identified. Upper chest: Chronic subpleural reticular opacity +/-paraseptal emphysema in both upper lungs. Superior mediastinal lymph nodes are within normal limits. Other neck: Stable and negative; atrophied or absent left submandibular gland as before. Aortic arch: Prior CABG. Extensive Calcified aortic atherosclerosis. 3 vessel arch configuration. Right carotid system: Bulky calcified plaque at the brachiocephalic origin and proximal segment with up to 50% stenosis. Mild plaque at the right CCA origin without stenosis. Tortuous proximal right CCA. Calcified plaque at the right carotid bifurcation and proximal right ICA. No significant cervical right ICA stenosis. Mild tortuosity. Left carotid system: Calcified plaque at the left CCA origin does not appear significant. Intermittent calcified plaque proximal to the bifurcation without stenosis. Bulky calcified plaque at the left ICA origin is stable since 2016 with less than 50 % stenosis with respect to the distal vessel. Tortuous cervical left ICA. Vertebral arteries: Proximal right subclavian artery plaque but no significant stenosis. The right vertebral origin is spared. The right vertebral artery is tortuous throughout the neck but patent to the skull base without stenosis. Bulky calcified plaque in the proximal left subclavian artery. Subsequent stenosis is roughly 50%. The left vertebral artery origin is largely spared. The left vertebral is mildly dominant and patent to the skull base without stenosis. CTA HEAD Posterior circulation: Bilateral V4 segment calcified plaque without significant stenosis. The left V4 segment is dominant. Normal bilateral PICA origins. Patent vertebrobasilar junction and basilar artery without stenosis. Normal SCA and PCA origins. Posterior communicating arteries are diminutive or absent. Bilateral PCA branches are stable and within  normal limits. Anterior circulation: Both ICA siphons are patent but heavily calcified. On the right there is up to moderate stenosis in the supraclinoid segment. On the left there is moderate cavernous and supraclinoid stenosis. Patent carotid termini. Normal MCA and ACA origins. Anterior communicating artery and bilateral ACA branches are within normal limits aside from calcified atherosclerosis at the distal left ACA A2 or a 3 branch point (series 12, image 26), new since 2016. Right MCA M1 segment, bifurcation, and right MCA branches are stable and within normal limits. The left MCA M1 and left MCA trifurcation are patent without stenosis. No M2 occlusion is identified. But a posterior M3 branch of the middle M2 division is occluded as seen on series 12, image 35 and was patent on the 2016 CTA. Venous sinuses: Patent. Anatomic variants: Dominant left vertebral artery. Review of the MIP images confirms the above findings IMPRESSION: 1. Negative for LVO but positive for Left MCA M3 branch occlusion, and CTP detects 12 mL of ischemic penumbra in the corresponding posterior left MCA territory. No infarct core detected. This was reviewed by telephone with Dr. Roland Rack on 04/18/2018 at 0820 hours. 2. Extensive chronic large vessel calcified atherosclerosis in the head and neck, and Aortic atherosclerosis (ICD10-I70.0). - moderate stenoses of the bilateral ICA siphons. - no hemodynamically significant cervical ICA or vertebral artery stenosis. - up to 50% stenosis of multiple proximal great vessels. Electronically Signed   By: Genevie Ann M.D.   On: 04/18/2018 08:36   Mr Brain Wo Contrast  Result  Date: 04/18/2018 CLINICAL DATA:  83 year old female code stroke. Left MCA M3 branch occlusion on CTA / CTP. earlier today. Atrial fibrillation on Eliquis, not a candidate for IV tPA. EXAM: MRI HEAD WITHOUT CONTRAST TECHNIQUE: Multiplanar, multiecho pulse sequences of the brain and surrounding structures were  obtained without intravenous contrast. COMPARISON:  CTA CT perfusion 0802 hours today. Brain MRI 05/30/2014. FINDINGS: Brain: Scattered small cortical infarcts in the left MCA territory primarily along the left middle frontal gyrus along the posterior and superior margins of chronic encephalomalacia which is related to the 2016 infarct. There also is a small area of cortical infarction along the posterior left insula (series 3, image 24) and left inferior frontal operculum (series 3, image 26). Faint T2 and FLAIR hyperintensity in the affected parenchyma with no hemorrhage or mass effect. Superimposed small area of left parietal lobe and medial motor strip chronic cortical encephalomalacia on series 6, image 19. No contralateral right hemisphere or posterior circulation restricted diffusion. Nomidline shift, mass effect, evidence of mass lesion, ventriculomegaly, extra-axial collection or acute intracranial hemorrhage. Cervicomedullary junction and pituitary are within normal limits. Elsewhere in the brain gray and white matter signal has not significantly changed since 2016. The deep gray matter nuclei, brainstem, and cerebellum remain normal for age. No chronic cerebral blood products identified. Vascular: Major intracranial vascular flow voids are stable since 2016. Skull and upper cervical spine: Negative for age visible cervical spine. Normal bone marrow signal. Sinuses/Orbits: Stable and negative. Other: Mastoids remain clear. Visible internal auditory structures appear normal. Scalp and face soft tissues are within normal limits. IMPRESSION: 1. Multiple small cortical infarcts scattered in the Left MCA territory from the posterior insula to the left middle frontal gyrus are compatible with the sequelae of the Left M3 branch occlusion demonstrated earlier today. 2. No associated hemorrhage or mass effect. No other acute vascular territory ischemia. 3. Superimposed chronic encephalomalacia in the left MCA  territory. Electronically Signed   By: Genevie Ann M.D.   On: 04/18/2018 13:57   Ct Cerebral Perfusion W Contrast  Result Date: 04/18/2018 CLINICAL DATA:  83 year old female code stroke, right facial droop. EXAM: CT ANGIOGRAPHY HEAD AND NECK CT PERFUSION BRAIN TECHNIQUE: Multidetector CT imaging of the head and neck was performed using the standard protocol during bolus administration of intravenous contrast. Multiplanar CT image reconstructions and MIPs were obtained to evaluate the vascular anatomy. Carotid stenosis measurements (when applicable) are obtained utilizing NASCET criteria, using the distal internal carotid diameter as the denominator. Multiphase CT imaging of the brain was performed following IV bolus contrast injection. Subsequent parametric perfusion maps were calculated using RAPID software. CONTRAST:  163mL ISOVUE-370 IOPAMIDOL (ISOVUE-370) INJECTION 76% COMPARISON:  CT head without contrast 0753 hours today. Prior CTA head and neck 05/30/2014. FINDINGS: CT Brain Perfusion Findings: CBF (<30%) Volume: 53mL Perfusion (Tmax>6.0s) volume: 57mL Mismatch Volume: 23mL Infarction Location:Penumbra location is in the posterior left MCA territory CTA NECK Skeleton: Absent dentition. Prior sternotomy. Osteopenia. Cervical spine degeneration. No acute osseous abnormality identified. Upper chest: Chronic subpleural reticular opacity +/-paraseptal emphysema in both upper lungs. Superior mediastinal lymph nodes are within normal limits. Other neck: Stable and negative; atrophied or absent left submandibular gland as before. Aortic arch: Prior CABG. Extensive Calcified aortic atherosclerosis. 3 vessel arch configuration. Right carotid system: Bulky calcified plaque at the brachiocephalic origin and proximal segment with up to 50% stenosis. Mild plaque at the right CCA origin without stenosis. Tortuous proximal right CCA. Calcified plaque at the right carotid bifurcation and proximal right  ICA. No significant  cervical right ICA stenosis. Mild tortuosity. Left carotid system: Calcified plaque at the left CCA origin does not appear significant. Intermittent calcified plaque proximal to the bifurcation without stenosis. Bulky calcified plaque at the left ICA origin is stable since 2016 with less than 50 % stenosis with respect to the distal vessel. Tortuous cervical left ICA. Vertebral arteries: Proximal right subclavian artery plaque but no significant stenosis. The right vertebral origin is spared. The right vertebral artery is tortuous throughout the neck but patent to the skull base without stenosis. Bulky calcified plaque in the proximal left subclavian artery. Subsequent stenosis is roughly 50%. The left vertebral artery origin is largely spared. The left vertebral is mildly dominant and patent to the skull base without stenosis. CTA HEAD Posterior circulation: Bilateral V4 segment calcified plaque without significant stenosis. The left V4 segment is dominant. Normal bilateral PICA origins. Patent vertebrobasilar junction and basilar artery without stenosis. Normal SCA and PCA origins. Posterior communicating arteries are diminutive or absent. Bilateral PCA branches are stable and within normal limits. Anterior circulation: Both ICA siphons are patent but heavily calcified. On the right there is up to moderate stenosis in the supraclinoid segment. On the left there is moderate cavernous and supraclinoid stenosis. Patent carotid termini. Normal MCA and ACA origins. Anterior communicating artery and bilateral ACA branches are within normal limits aside from calcified atherosclerosis at the distal left ACA A2 or a 3 branch point (series 12, image 26), new since 2016. Right MCA M1 segment, bifurcation, and right MCA branches are stable and within normal limits. The left MCA M1 and left MCA trifurcation are patent without stenosis. No M2 occlusion is identified. But a posterior M3 branch of the middle M2 division is  occluded as seen on series 12, image 35 and was patent on the 2016 CTA. Venous sinuses: Patent. Anatomic variants: Dominant left vertebral artery. Review of the MIP images confirms the above findings IMPRESSION: 1. Negative for LVO but positive for Left MCA M3 branch occlusion, and CTP detects 12 mL of ischemic penumbra in the corresponding posterior left MCA territory. No infarct core detected. This was reviewed by telephone with Dr. Roland Rack on 04/18/2018 at 0820 hours. 2. Extensive chronic large vessel calcified atherosclerosis in the head and neck, and Aortic atherosclerosis (ICD10-I70.0). - moderate stenoses of the bilateral ICA siphons. - no hemodynamically significant cervical ICA or vertebral artery stenosis. - up to 50% stenosis of multiple proximal great vessels. Electronically Signed   By: Genevie Ann M.D.   On: 04/18/2018 08:36   Ct Head Code Stroke Wo Contrast  Result Date: 04/18/2018 CLINICAL DATA:  Code stroke. 83 year old female with right facial droop last seen normal 1730 hours yesterday. EXAM: CT HEAD WITHOUT CONTRAST TECHNIQUE: Contiguous axial images were obtained from the base of the skull through the vertex without intravenous contrast. COMPARISON:  04/10/2018 and earlier. FINDINGS: Brain: Left frontal operculum and middle frontal gyrus encephalomalacia appears stable from 1 week ago. No midline shift, mass effect, or evidence of intracranial mass lesion. No acute intracranial hemorrhage identified. No ventriculomegaly. No new cortical based infarct identified. Gray-white matter differentiation appears stable. Vascular: Extensive Calcified atherosclerosis at the skull base. No suspicious intracranial vascular hyperdensity. Skull: No acute osseous abnormality identified. Sinuses/Orbits: Visualized paranasal sinuses and mastoids are stable and well pneumatized. Other: No acute orbit or scalp soft tissue findings. ASPECTS [facility] Stroke Program Early CT Score) - Ganglionic level  infarction (caudate, lentiform nuclei, internal capsule, insula, M1-M3 cortex): 7 -  Supraganglionic infarction (M4-M6 cortex): 3 Total score (0-10 with 10 being normal): 10 IMPRESSION: 1. Left frontal operculum and middle frontal gyrus encephalomalacia appears stable from 1 week ago. No acute intracranial hemorrhage or acute cortically based infarct identified. 2. ASPECTS is 10. 3. These results were communicated to Dr. Leonel Ramsay at [place] 12/31/2019by text page via the [facility] messaging system. Electronically Signed   By: Genevie Ann M.D.   On: 04/18/2018 08:15   Vas US Carotid (at [place] Only)  Result Date: 04/18/2018 Carotid Arterial Duplex Study Indications: CVA. Limitations: difficulty visualizing anatomy well due to tortuousity Performing Technologist: Abram Sander RVS  Examination Guidelines: A complete evaluation includes B-mode imaging, spectral Doppler, color Doppler, and power Doppler as needed of all accessible portions of each vessel. Bilateral testing is considered an integral part of a complete examination. Limited examinations for reoccurring indications may be performed as noted.  Right Carotid Findings: +----------+--------+--------+--------+------------+--------+           PSV cm/sEDV cm/sStenosisDescribe    Comments +----------+--------+--------+--------+------------+--------+ CCA Prox  62                      heterogenous         +----------+--------+--------+--------+------------+--------+ CCA Distal43                      heterogenous         +----------+--------+--------+--------+------------+--------+ ICA Prox  82      10      1-39%   heterogenous         +----------+--------+--------+--------+------------+--------+ ICA Distal79      8                                    +----------+--------+--------+--------+------------+--------+ ECA       146                                           +----------+--------+--------+--------+------------+--------+ +----------+--------+-------+--------+-------------------+           PSV cm/sEDV cmsDescribeArm Pressure (mmHG) +----------+--------+-------+--------+-------------------+ Subclavian118                                        +----------+--------+-------+--------+-------------------+ +---------+--------+--+--------+-+---------+ VertebralPSV cm/s42EDV cm/s5Antegrade +---------+--------+--+--------+-+---------+  Left Carotid Findings: +----------+--------+--------+--------+------------+--------+           PSV cm/sEDV cm/sStenosisDescribe    Comments +----------+--------+--------+--------+------------+--------+ CCA Prox  91      7               heterogenous         +----------+--------+--------+--------+------------+--------+ CCA Distal70      8               heterogenous         +----------+--------+--------+--------+------------+--------+ ICA Prox  94      12      1-39%   heterogenous         +----------+--------+--------+--------+------------+--------+ ICA Distal82      12                                   +----------+--------+--------+--------+------------+--------+ ECA       75  6                                    +----------+--------+--------+--------+------------+--------+ +----------+--------+--------+--------+-------------------+ SubclavianPSV cm/sEDV cm/sDescribeArm Pressure (mmHG) +----------+--------+--------+--------+-------------------+           91                                          +----------+--------+--------+--------+-------------------+ +---------+--------+--+--------+--+---------+ VertebralPSV cm/s93EDV cm/s12Antegrade +---------+--------+--+--------+--+---------+  Summary: Right Carotid: Velocities in the right ICA are consistent with a 1-39% stenosis. Left Carotid: Velocities in the left ICA are consistent with a 1-39% stenosis. Vertebrals:  Bilateral vertebral arteries demonstrate antegrade flow. *See table(s) above for measurements and observations.  Electronically signed by Servando Snare MD on 04/18/2018 at 5:10:01 PM.    Final     Assessment/Plan: Diagnosis: Left MCA infarct Labs and images (see above) independently reviewed.  Records reviewed and summated above. Stroke: Continue secondary stroke prophylaxis and Risk Factor Modification listed below:   Antiplatelet therapy:   Blood Pressure Management:  Continue current medication with prn's with permisive HTN per primary team Statin Agent:   Diabetes management:   Right sided hemiparesis Motor recovery: Fluoxetine  1. Does the need for close, 24 hr/day medical supervision in concert with the patient's rehab needs make it unreasonable for this patient to be served in a less intensive setting? Potentially  2. Co-Morbidities requiring supervision/potential complications:  DM with labile blood glucose (Monitor in accordance with exercise and adjust meds as necessary), PVD, PAF (continue meds, monitor heart rate with increased physical activity), obesity, HTN (monitor and provide prns in accordance with increased physical exertion and pain), CVA in 2016, fibromyalgia, diastolic congestive heart failure (monitor for signs and symptoms of fluid overload), CKD (avoid nephrotoxic meds), CAD, status post TAVR, leukocytosis (repeat labs, cont to monitor for signs and symptoms of infection, further workup if indicated), acute blood loss anemia 3. Due to safety, disease management and patient education, does the patient require 24 hr/day rehab nursing? Yes 4. Does the patient require coordinated care of a physician, rehab nurse, PT (1-2 hrs/day, 5 days/week), OT (1-2 hrs/day, 5 days/week) and SLP (1-2 hrs/day, 5 days/week) to address physical and functional deficits in the context of the above medical diagnosis(es)? Yes Addressing deficits in the following areas: balance, endurance,  locomotion, strength, transferring, bathing, dressing, toileting, speech, language, swallowing and psychosocial support 5. Can the patient actively participate in an intensive therapy program of at least 3 hrs of therapy per day at least 5 days per week? Potentially 6. The potential for patient to make measurable gains while on inpatient rehab is TBD 7. Anticipated functional outcomes upon discharge from inpatient rehab are TBD  with PT, TBD with OT, TBD with SLP. 8. Estimated rehab length of stay to reach the above functional goals is: TBD. 9. Anticipated D/C setting: TBD. 10. Anticipated post D/C treatments: HH therapy and Home excercise program 11. Overall Rehab/Functional Prognosis: good  RECOMMENDATIONS: This patient's condition is appropriate for continued rehabilitative care in the following setting: Will await therapy evals.  Possibly CIR if caregiver support available in accordance with anticipated level of care. Patient has agreed to participate in recommended program. Potentially Note that insurance prior authorization may be required for reimbursement for recommended care.  Comment: Rehab Admissions Coordinator to follow up.   Delice Lesch, MD,  ABPMR 04/19/2018

## 2018-04-19 NOTE — Evaluation (Signed)
Speech Language Pathology Evaluation Patient Details Name: Kristina Summers MRN: [id number] DOB: Aug 04, 1935 Today's Date: 04/19/2018 Time: 1105-1140 SLP Time Calculation (min) (ACUTE ONLY): 35 min  Problem List:  Patient Active Problem List   Diagnosis Date Noted  . Acute ischemic stroke ([place])   . Labile blood glucose   . Benign essential HTN   . Leukocytosis   . CVA (cerebral vascular accident) ([place]) 04/18/2018  . CHF exacerbation ([place]) 04/10/2018  . Acute exacerbation of congestive heart failure ([place]) 03/20/2018  . Gastrointestinal hemorrhage   . Acute on chronic systolic CHF (congestive heart failure) ([place]) 03/13/2018  . Decreased hearing 02/03/2018  . History of myocardial infarction 02/03/2018  . Methicillin resistant Staphylococcus aureus infection 02/03/2018  . Acute blood loss anemia 12/22/2017  . MRSA bacteremia 12/12/2017  . CKD (chronic kidney disease), stage III ([place]) 12/11/2017  . UTI (urinary tract infection) 12/11/2017  . Foot ulcer, right ([place]) 12/11/2017  . 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 Severe aortic stenosis 10/11/2017  . Hypochromic anemia 08/25/2017  . Essential hypertension 06/10/2017  . Type II diabetes mellitus with renal manifestations ([place]) 06/10/2017  . Acute on chronic congestive heart failure ([place]) 01/28/2017  . Fall   . Fracture of distal end of right fibula 05/29/2014  . History of CVA (cerebrovascular accident) 05/24/2014  . Peripheral vascular disease, unspecified ([place]) 01/11/2014  . CAD (coronary artery disease)   . Paroxysmal atrial fibrillation   . Diabetes mellitus type 2 with peripheral artery disease ([place])   . Hyperlipidemia   . History of GI bleed 04/21/2009  . S/P CABG (coronary artery bypass graft) 11/20/1998   Past Medical History:  Past Medical History:  Diagnosis Date  . Anemia   . CAD (coronary artery disease)    a. s/p CABG in 2000  . Carotid artery occlusion   . CKD (chronic kidney disease)    . Diastolic CHF ([place]) 22/6333  . Fibromyalgia   . GERD (gastroesophageal reflux disease)   . History of CVA (cerebrovascular accident) 05/24/2014  . History of GI bleed   . Hyperlipidemia   . Hypertension   . Obesity   . PAF (paroxysmal atrial fibrillation) ([facility])    a. on Eliquis  . Peripheral vascular disease ([place])   . 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 Type II diabetes mellitus ([place]) dx'd 1979   Past Surgical History:  Past Surgical History:  Procedure Laterality Date  . ABDOMINAL AORTAGRAM N/A 04/01/2011   Procedure: ABDOMINAL AORTAGRAM;  Surgeon: Conrad Baraga, MD;  [facility] CATH LAB;  Service: Cardiovascular;  Laterality: N/A;  . ANGIOPLASTY  06/17/11   Left leg common femoral artery cannulation under u/s Left leg runoff  . ARTERY BIOPSY Right 04/13/2018   Procedure: BIOPSY TEMPORAL ARTERY RIGHT;  Surgeon: Rosetta Posner, MD;  Location: [place];  Service: Vascular;  Laterality: Right;  . CARDIAC CATHETERIZATION  10/11/2017  . CARPAL TUNNEL RELEASE Right   . CATARACT EXTRACTION W/ INTRAOCULAR LENS  IMPLANT, BILATERAL  2004-2005  . CORONARY ARTERY BYPASS GRAFT  2000   CABG X5  . ESOPHAGOGASTRODUODENOSCOPY (EGD) WITH PROPOFOL N/A 12/25/2017   Procedure: ESOPHAGOGASTRODUODENOSCOPY (EGD) WITH PROPOFOL;  Surgeon: Wonda Horner, MD;  Location: [facility] ENDOSCOPY;  Service: Endoscopy;  Laterality: N/A;  . EYE SURGERY Left    "lasered before cataract OR"  . GIVENS CAPSULE STUDY  03/14/2018  .  GIVENS CAPSULE STUDY N/A 03/14/2018   Procedure: GIVENS CAPSULE STUDY;  Surgeon: Ronald Lobo, MD;  Location: [place];  Service: Endoscopy;  Laterality: N/A;  . LOWER EXTREMITY ANGIOGRAM Bilateral 04/01/2011   Procedure: LOWER EXTREMITY ANGIOGRAM;  Surgeon: Conrad Healy, MD;  Location: [facility] CATH LAB;  Service: Cardiovascular;  Laterality: Bilateral;  . LOWER  EXTREMITY ANGIOGRAM Left 06/17/2011   Procedure: LOWER EXTREMITY ANGIOGRAM;  Surgeon: Conrad [place], MD;  Location: [facility] CATH LAB;  Service: Cardiovascular;  Laterality: Left;  . LOWER EXTREMITY ANGIOGRAM N/A 11/18/2011   Procedure: LOWER EXTREMITY ANGIOGRAM;  Surgeon: Conrad [place]aconda, MD;  Location[facility] C[facility] LAB;  Service: Cardiovascular;  Laterality: N/A;  . PERCUTANEOUS STENT INTERVENTION Right 04/01/2011   Procedure: PERCUTANEOUS STENT INTERVENTION;  Surgeon: Conrad [place], MD;  Location: [facility] CATH LAB;  Service: Cardiovascular;  Laterality: Right;  rt iliac stent  . PERIPHERAL ARTERIAL STENT GRAFT  04/01/11   right common iliac  . RIGHT/LEFT HEART CATH AND CORONARY/GRAFT ANGIOGRAPHY N/A 10/11/2017   Procedure: RIGHT/LEFT HEART CATH AND CORONARY/GRAFT ANGIOGRAPHY;  Surgeon: Sherren Mocha, MD;  Location: [place] CV LAB;  Service: Cardiovascular;  Laterality: N/A;  . TEE WITHOUT CARDIOVERSION N/A 11/08/2017   Procedure: TRANSESOPHAGEAL ECHOCARDIOGRAM (TEE);  Surgeon: Sherren Mocha, MD;  Location: [place];  Service: Open Heart Surgery;  Laterality: N/A;  . TEE WITHOUT CARDIOVERSION N/A 12/15/2017   Procedure: TRANSESOPHAGEAL ECHOCARDIOGRAM (TEE);  Surgeon: Larey Dresser, MD;  Location: [facility] ENDOSCOPY;  Service: Cardiovascular;  Laterality: N/A;  . TRANSCATHETER AORTIC VALVE REPLACEMENT, TRANSFEMORAL N/A 11/08/2017   Procedure: TRANSCATHETER AORTIC VALVE REPLACEMENT, TRANSFEMORAL;  Surgeon: Sherren Mocha, MD;  Location: [place];  Service: Open Heart Surgery;  Laterality: N/A;  . TRIGGER FINGER RELEASE Left 1996   thumb   HPI:  83 y.o. [place] female with medical history significant of DM; PVD; afib on Elquis; s/p TAVR 11/08/17; HTN; HLD; CVA; diastolic CHF; CAD s/p CABG; and CKD presenting with aphasia, facial droop, R sided weakness. MRI of head revealed: Multiple small cortical infarcts scattered in the Left MCA territory; recent hospitalizations include: 12/2 to 32/6/71 for diastolic CHF, and  24/58-09/98   Assessment / Plan / Recommendation Clinical Impression  A severe expressive aphasia was exhibited as well as suspected mild receptive impairments (with complex conversations, though interfering components suspected to include hard of hearing).  Pt able to follow 1-2 step commands and was 100 percent accurate with simple yes/no questions.  Pt is aware of aphasic errors and was stimulable for some automatic language output with cueing and melodic intonation though communication output was limited to the word level. Severe anomia persists 0/5 familiar objects. Communication board was left at bedside.  Cognitive abilities were unable to be formally assessed in setting of severity of aphasia; no family member or caregiver present this date. SLP to follow up for acute deficits mentioned above to aid in linguistic recovery.     SLP Assessment  SLP Recommendation/Assessment: Patient needs continued Speech Lanaguage Pathology Services SLP Visit Diagnosis: Aphasia (R47.01)    Follow Up Recommendations  Inpatient Rehab;Skilled Nursing facility    Frequency and Duration min 2x/week  2 weeks      SLP Evaluation Cognition  Overall Cognitive Status: No family/caregiver present to determine baseline cognitive functioning Arousal/Alertness: Awake/alert Orientation Level: Oriented X4       Comprehension  Auditory Comprehension Overall Auditory Comprehension: Impaired(complex comprehension pt nodded yes to difficulty) Yes/No Questions: Within Functional Limits Commands: Within Functional Limits Conversation: Complex Interfering Components:  Hearing EffectiveTechniques: Slowed speech;Repetition;Pausing;Extra processing time;Stressing words Reading Comprehension Reading Status: Impaired    Expression Expression Primary Mode of Expression: Nonverbal - gestures(minimal automatic speech some yes, no ) Verbal Expression Overall Verbal Expression: Impaired Initiation: Impaired Automatic  Speech: Name;Social Response;Counting;Day of week;Month of year;Singing Level of Generative/Spontaneous Verbalization: Word Repetition: Impaired Level of Impairment: Word level Naming: Impairment Responsive: 0-25% accurate Confrontation: Impaired Convergent: 0-24% accurate Divergent: 0-24% accurate Verbal Errors: Neologisms;Phonemic paraphasias;Aware of errors;Jargon;Perseveration Effective Techniques: Melodic intonation Written Expression Dominant Hand: Right Written Expression: Exceptions to [facility]   Oral / Motor  Oral Motor/Sensory Function Overall Oral Motor/Sensory Function: Mild impairment Facial ROM: Reduced right Facial Symmetry: Abnormal symmetry right   GO                    Savita Runner E Samone Guhl MA, CCC-SLP Acute Rehab Speech Language Pathology 04/19/2018, 12:04 PM

## 2018-04-19 NOTE — Evaluation (Addendum)
Physical Therapy Evaluation Patient Details Name: Kristina Summers MRN: [id number] DOB: 1935-12-26 Today's Date: 04/19/2018   History of Present Illness  Pt is a 83 y.o. [place] female with medical history significant of DM; PVD; afib on [place]; s/p TAVR 11/08/17; HTN; HLD; CVA; diastolic CHF; CAD s/p CABG; and CKD presenting with aphasia, facial droop, R sided weakness recent hospitalizations include: 12/2 to 30/0/76 for diastolic CHF, and 22/63-33/54.  Clinical Impression  Pt admitted with above diagnosis. Pt currently with functional limitations due to the deficits listed below (see PT Problem List). Pt was able to stand with min assist and cues.  Pt with right inattention but does have movement on right side and does well with cues to attend to right side.  Has great potential.  Will need SNF to recover.   Pt will benefit from skilled PT to increase their independence and safety with mobility to allow discharge to the venue listed below.      Follow Up Recommendations SNF;Supervision/Assistance - 24 hour    Equipment Recommendations  None recommended by PT    Recommendations for Other Services       Precautions / Restrictions Precautions Precautions: Fall Restrictions Weight Bearing Restrictions: No      Mobility  Bed Mobility Overal bed mobility: Modified Independent             General bed mobility comments: used rail but able to come to eOB without assist  Transfers Overall transfer level: Needs assistance Equipment used: None Transfers: Sit to/from Stand Sit to Stand: Min assist;+2 safety/equipment         General transfer comment: Pt needed assist to stand from bed.  Pt holding onto PT with bil UEs for support. Difficulty moving right LE to pivot needing cues to attend to that side to pivot to the 3N1.  Pt did better pivoting back to bed as she was going to left side.    Ambulation/Gait             General Gait Details: NT today due to IV infiltrated and nurse  was in room addressing that as well as transfer to 3N1.    Stairs            Wheelchair Mobility    Modified Rankin (Stroke Patients Only) Modified Rankin (Stroke Patients Only) Pre-Morbid Rankin Score: Moderate disability Modified Rankin: Moderately severe disability     Balance Overall balance assessment: Needs assistance Sitting-balance support: No upper extremity supported;Feet supported;Single extremity supported Sitting balance-Leahy Scale: Fair     Standing balance support: Bilateral upper extremity supported;During functional activity Standing balance-Leahy Scale: Poor Standing balance comment: Needed Bil UE support to stand for stability due to right hemibody weakness.                              Pertinent Vitals/Pain Pain Assessment: No/denies pain    Home Living Family/patient expects to be discharged to:: Private residence Living Arrangements: Alone Available Help at Discharge: Family;Available PRN/intermittently Type of Home: Apartment Home Access: Stairs to enter Entrance Stairs-Rails: Right Entrance Stairs-Number of Steps: 1 Home Layout: One level Home Equipment: Walker - 4 wheels;Cane - single point;Tub bench;Grab bars - tub/shower Additional Comments: Senior living apartment    Prior Function Level of Independence: Independent with assistive device(s)   Gait / Transfers Assistance Needed: Household ambulation without DME. Uses rollator for community ambulation. Enjoys walking to community center nearby  ADL's / Homemaking Assistance Needed: independent  with ADLs, light meal prep, Daughter drives for errands; uses transportation to MD appointments  Comments: Poor vision; typically only walks in familiar environments     Hand Dominance   Dominant Hand: Right    Extremity/Trunk Assessment   Upper Extremity Assessment Upper Extremity Assessment: Defer to OT evaluation    Lower Extremity Assessment Lower Extremity Assessment:  RLE deficits/detail RLE Deficits / Details: grossly 3-/5 RLE Sensation: decreased light touch;decreased proprioception RLE Coordination: decreased fine motor;decreased gross motor    Cervical / Trunk Assessment Cervical / Trunk Assessment: Normal  Communication   Communication: Expressive difficulties  Cognition Arousal/Alertness: Awake/alert Behavior During Therapy: WFL for tasks assessed/performed Overall Cognitive Status: No family/caregiver present to determine baseline cognitive functioning Area of Impairment: Attention                   Current Attention Level: Selective           General Comments: Pt appears to have inattention to right hemibody.      General Comments      Exercises General Exercises - Lower Extremity Ankle Circles/Pumps: AROM;Both;10 reps;Seated Long Arc Quad: AROM;Both;10 reps;Seated   Assessment/Plan    PT Assessment Patient needs continued PT services  PT Problem List Decreased strength;Decreased activity tolerance;Decreased balance;Decreased mobility;Cardiopulmonary status limiting activity;Decreased knowledge of use of DME;Decreased safety awareness;Decreased coordination       PT Treatment Interventions Gait training;Stair training;Functional mobility training;Therapeutic activities;DME instruction;Therapeutic exercise;Balance training;Patient/family education    PT Goals (Current goals can be found in the Care Plan section)  Acute Rehab PT Goals Patient Stated Goal: get therapy to get better PT Goal Formulation: With patient Time For Goal Achievement: 05/03/18 Potential to Achieve Goals: Good    Frequency Min 3X/week   Barriers to discharge Decreased caregiver support      Co-evaluation               AM-PAC PT "6 Clicks" Mobility  Outcome Measure Help needed turning from your back to your side while in a flat bed without using bedrails?: None Help needed moving from lying on your back to sitting on the side of a  flat bed without using bedrails?: None Help needed moving to and from a bed to a chair (including a wheelchair)?: A Lot Help needed standing up from a chair using your arms (e.g., wheelchair or bedside chair)?: A Lot Help needed to walk in hospital room?: A Lot Help needed climbing 3-5 steps with a railing? : Total 6 Click Score: 15    End of Session Equipment Utilized During Treatment: Gait belt Activity Tolerance: Patient tolerated treatment well Patient left: in bed;with call bell/phone within reach;with bed alarm set Nurse Communication: Mobility status PT Visit Diagnosis: Other abnormalities of gait and mobility (R26.89);Muscle weakness (generalized) (M62.81);Hemiplegia and hemiparesis Hemiplegia - Right/Left: Right Hemiplegia - dominant/non-dominant: Dominant Hemiplegia - caused by: Cerebral infarction    Time: 0916-1000 PT Time Calculation (min) (ACUTE ONLY): 44 min   Charges:   PT Evaluation $PT Eval Moderate Complexity: 1 Mod PT Treatments $Therapeutic Exercise: 8-22 mins $Therapeutic Activity: 8-22 mins        Monetta Lick,PT Acute Rehabilitation Services Pager:  [phone]  Office:  [place] 04/19/2018, 10:57 AM

## 2018-04-19 NOTE — Progress Notes (Signed)
PROGRESS NOTE    Kristina Summers  BTD:[id number] DOB: 1935/07/28 DOA: 04/18/2018 PCP: [facility]    Brief Narrative:  Kristina Summers is a 83 y.o. [place] female with medical history significant of DM; PVD; afib on Elquis; s/p TAVR 11/08/17; HTN; HLD; CVA; diastolic CHF; CAD s/p CABG; and CKD presenting with aphasia  Assessment & Plan:   Principal Problem:   CVA (cerebral vascular accident) ([place]) Active Problems:   Paroxysmal atrial fibrillation   Diabetes mellitus type 2 with peripheral artery disease ([place])   Hyperlipidemia   Essential hypertension   CKD (chronic kidney disease), stage III ([place])   Foot ulcer, right ([place])   Acute ischemic stroke ([place])   Labile blood glucose   Benign essential HTN   Leukocytosis   Left MCA stroke; Admitted for further evaluation of stroke.  Neurology consulted and recommendations given.  MRi Summers reviewed.  Holding eliquis as per neurology.  On aspirin 325 mg daily.  Therapy evals recommending CIR.    Hypertension:  Sub optimal.    Type 2 DM: CBG (last 3)  Recent Labs    04/18/18 2125 04/19/18 0623 04/19/18 1136  GLUCAP 218* 79 192*  .  Resume SSI.   Chronic atrial fibrillation:  Rate controlled.  Restart eliquis in 1 to 2 days after discussing with neurology.   Chronic right foot infection:  On rocephin and flagyl . Will need outpatient podiatry follow up on discharge.    DVT prophylaxis:  SCD'S Code Status: DNR.  Family Communication:  None at bedside.  Disposition Plan: pending work up.    Consultants:   Neurology.    Procedures: MRI Summers without contrast.   Antimicrobials: none.   Subjective: Aphasic, not in distress.   Objective: Vitals:   04/18/18 1929 04/19/18 0004 04/19/18 0422 04/19/18 0901  BP: (!) 159/55 (!) 154/67 (!) 166/62 (!) 163/76  Pulse: 96 95 96 84  Resp: 18 19 20 17   Temp: 98.2 F (36.8 C) 99.8 F (37.7 C) 98.7 F (37.1 C) (!) 97.5 F (36.4 C)  TempSrc: Oral Oral Oral Oral    SpO2: 95% 96% 95%     Intake/Output Summary (Last 24 hours) at 04/19/2018 1325 Last data filed at 04/19/2018 0955 Gross per 24 hour  Intake 928.16 ml  Output 1150 ml  Net -221.84 ml   There were no vitals filed for this visit.  Examination:  General exam: Appears calm and comfortable  Respiratory system: Clear to auscultation. Respiratory effort normal. Cardiovascular system: S1 & S2 heard, irregular. No JVD.  Gastrointestinal system: Abdomen is nondistended, soft and nontender.  Normal bowel sounds heard. Central nervous system: Alert , comfortable, but aphasic. Right hemiparesis.  Extremities: no pedal edema.  Skin: No rashes, lesions or ulcers Psychiatry: Mood & affect appropriate.     Data Reviewed: I have personally reviewed following labs and imaging studies  CBC: Recent Labs  Lab 04/12/18 1448 04/14/18 0850 04/15/18 0405 04/18/18 0747 04/18/18 0802 04/19/18 0729  WBC 12.1* 10.1 9.2 15.9*  --  13.5*  NEUTROABS  --  9.6* 8.0* 14.0*  --   --   HGB 8.8* 9.2* 8.6* 8.7* 9.9* 8.8*  HCT 28.4* 30.5* 27.5* 28.4* 29.0* 27.7*  MCV 86.1 87.6 86.8 85.8  --  86.8  PLT 280 230 211 163  --  106   Basic Metabolic Panel: Recent Labs  Lab 04/13/18 0919 04/14/18 0850 04/15/18 0405 04/18/18 0747 04/18/18 0802 04/19/18 0729  NA 133* 134* 136 132* 133* 138  K  4.9 4.3 4.7 4.1 4.0 4.0  CL 103 105 106 102 102 110  CO2 23 22 19* 20*  --  21*  GLUCOSE 320* 256* 109* 397* 413* 92  BUN 68* 71* 73* 62* 56* 49*  CREATININE 2.03* 1.91* 1.68* 1.64* 1.50* 1.36*  CALCIUM 9.0 8.9 8.7* 8.6*  --  8.1*   GFR: Estimated Creatinine Clearance: 35.5 mL/min (A) (by C-G formula based on SCr of 1.36 mg/dL (H)). Liver Function Tests: Recent Labs  Lab 04/18/18 0747  AST 43*  ALT 41  ALKPHOS 104  BILITOT 0.9  PROT 7.3  ALBUMIN 2.1*   No results for input(s): LIPASE, AMYLASE in the last 168 hours. No results for input(s): AMMONIA in the last 168 hours. Coagulation Profile: Recent Labs   Lab 04/18/18 0747  INR 1.14   Cardiac Enzymes: No results for input(s): CKTOTAL, CKMB, CKMBINDEX, TROPONINI in the last 168 hours. BNP (last 3 results) No results for input(s): PROBNP in the last 8760 hours. HbA1C: No results for input(s): HGBA1C in the last 72 hours. CBG: Recent Labs  Lab 04/18/18 1607 04/18/18 1645 04/18/18 2125 04/19/18 0623 04/19/18 1136  GLUCAP 296* 302* 218* 79 192*   Lipid Profile: Recent Labs    04/19/18 0427  CHOL 150  HDL 45  LDLCALC 89  TRIG 80  CHOLHDL 3.3   Thyroid Function Tests: No results for input(s): TSH, T4TOTAL, FREET4, T3FREE, THYROIDAB in the last 72 hours. Anemia Panel: No results for input(s): VITAMINB12, FOLATE, FERRITIN, TIBC, IRON, RETICCTPCT in the last 72 hours. Sepsis Labs: No results for input(s): PROCALCITON, LATICACIDVEN in the last 168 hours.  Recent Results (from the past 240 hour(s))  Surgical pcr screen     Status: Abnormal   Collection Time: 04/13/18 12:34 AM  Result Value Ref Range Status   MRSA, PCR POSITIVE (A) NEGATIVE Final    Comment: RESULT CALLED TO, READ BACK BY AND VERIFIED WITH: H HAMLIN RN 04/13/18 0318 JDW    Staphylococcus aureus POSITIVE (A) NEGATIVE Final    Comment: (NOTE) The Xpert SA Assay (FDA approved for NASAL specimens in patients 65 years of age and older), is one component of a comprehensive surveillance program. It is not intended to diagnose infection nor to guide or monitor treatment. Performed at [facility], [place] [address] [place], [place] [address]          Radiology Studies: Ct Angio Head W Or Wo Contrast  Result Date: 04/18/2018 CLINICAL DATA:  83 year old female code stroke, right facial droop. EXAM: CT ANGIOGRAPHY HEAD AND NECK CT PERFUSION Summers TECHNIQUE: Multidetector CT imaging of the head and neck was performed using the standard protocol during bolus administration of intravenous contrast. Multiplanar CT image reconstructions and MIPs were obtained  to evaluate the vascular anatomy. Carotid stenosis measurements (when applicable) are obtained utilizing NASCET criteria, using the distal internal carotid diameter as the denominator. Multiphase CT imaging of the Summers was performed following IV bolus contrast injection. Subsequent parametric perfusion maps were calculated using RAPID software. CONTRAST:  134mL ISOVUE-370 IOPAMIDOL (ISOVUE-370) INJECTION 76% COMPARISON:  CT head without contrast 0753 hours today. Prior CTA head and neck 05/30/2014. FINDINGS: CT Summers Perfusion Findings: CBF (<30%) Volume: 62mL Perfusion (Tmax>6.0s) volume: 3mL Mismatch Volume: 38mL Infarction Location:Penumbra location is in the posterior left MCA territory CTA NECK Skeleton: Absent dentition. Prior sternotomy. Osteopenia. Cervical spine degeneration. No acute osseous abnormality identified. Upper chest: Chronic subpleural reticular opacity +/-paraseptal emphysema in both upper lungs. Superior mediastinal lymph nodes are  within normal limits. Other neck: Stable and negative; atrophied or absent left submandibular gland as before. Aortic arch: Prior CABG. Extensive Calcified aortic atherosclerosis. 3 vessel arch configuration. Right carotid system: Bulky calcified plaque at the brachiocephalic origin and proximal segment with up to 50% stenosis. Mild plaque at the right CCA origin without stenosis. Tortuous proximal right CCA. Calcified plaque at the right carotid bifurcation and proximal right ICA. No significant cervical right ICA stenosis. Mild tortuosity. Left carotid system: Calcified plaque at the left CCA origin does not appear significant. Intermittent calcified plaque proximal to the bifurcation without stenosis. Bulky calcified plaque at the left ICA origin is stable since 2016 with less than 50 % stenosis with respect to the distal vessel. Tortuous cervical left ICA. Vertebral arteries: Proximal right subclavian artery plaque but no significant stenosis. The right  vertebral origin is spared. The right vertebral artery is tortuous throughout the neck but patent to the skull base without stenosis. Bulky calcified plaque in the proximal left subclavian artery. Subsequent stenosis is roughly 50%. The left vertebral artery origin is largely spared. The left vertebral is mildly dominant and patent to the skull base without stenosis. CTA HEAD Posterior circulation: Bilateral V4 segment calcified plaque without significant stenosis. The left V4 segment is dominant. Normal bilateral PICA origins. Patent vertebrobasilar junction and basilar artery without stenosis. Normal SCA and PCA origins. Posterior communicating arteries are diminutive or absent. Bilateral PCA branches are stable and within normal limits. Anterior circulation: Both ICA siphons are patent but heavily calcified. On the right there is up to moderate stenosis in the supraclinoid segment. On the left there is moderate cavernous and supraclinoid stenosis. Patent carotid termini. Normal MCA and ACA origins. Anterior communicating artery and bilateral ACA branches are within normal limits aside from calcified atherosclerosis at the distal left ACA A2 or a 3 branch point (series 12, image 26), new since 2016. Right MCA M1 segment, bifurcation, and right MCA branches are stable and within normal limits. The left MCA M1 and left MCA trifurcation are patent without stenosis. No M2 occlusion is identified. But a posterior M3 branch of the middle M2 division is occluded as seen on series 12, image 35 and was patent on the 2016 CTA. Venous sinuses: Patent. Anatomic variants: Dominant left vertebral artery. Review of the MIP images confirms the above findings IMPRESSION: 1. Negative for LVO but positive for Left MCA M3 branch occlusion, and CTP detects 12 mL of ischemic penumbra in the corresponding posterior left MCA territory. No infarct core detected. This was reviewed by telephone with Dr. Roland Rack on 04/18/2018 at  0820 hours. 2. Extensive chronic large vessel calcified atherosclerosis in the head and neck, and Aortic atherosclerosis (ICD10-I70.0). - moderate stenoses of the bilateral ICA siphons. - no hemodynamically significant cervical ICA or vertebral artery stenosis. - up to 50% stenosis of multiple proximal great vessels. Electronically Signed   By: Genevie Ann M.D.   On: 04/18/2018 08:36   Ct Angio Neck W Or Wo Contrast  Result Date: 04/18/2018 CLINICAL DATA:  83 year old female code stroke, right facial droop. EXAM: CT ANGIOGRAPHY HEAD AND NECK CT PERFUSION Summers TECHNIQUE: Multidetector CT imaging of the head and neck was performed using the standard protocol during bolus administration of intravenous contrast. Multiplanar CT image reconstructions and MIPs were obtained to evaluate the vascular anatomy. Carotid stenosis measurements (when applicable) are obtained utilizing NASCET criteria, using the distal internal carotid diameter as the denominator. Multiphase CT imaging of the Summers was performed following  IV bolus contrast injection. Subsequent parametric perfusion maps were calculated using RAPID software. CONTRAST:  13mL ISOVUE-370 IOPAMIDOL (ISOVUE-370) INJECTION 76% COMPARISON:  CT head without contrast 0753 hours today. Prior CTA head and neck 05/30/2014. FINDINGS: CT Summers Perfusion Findings: CBF (<30%) Volume: 57mL Perfusion (Tmax>6.0s) volume: 63mL Mismatch Volume: 59mL Infarction Location:Penumbra location is in the posterior left MCA territory CTA NECK Skeleton: Absent dentition. Prior sternotomy. Osteopenia. Cervical spine degeneration. No acute osseous abnormality identified. Upper chest: Chronic subpleural reticular opacity +/-paraseptal emphysema in both upper lungs. Superior mediastinal lymph nodes are within normal limits. Other neck: Stable and negative; atrophied or absent left submandibular gland as before. Aortic arch: Prior CABG. Extensive Calcified aortic atherosclerosis. 3 vessel arch  configuration. Right carotid system: Bulky calcified plaque at the brachiocephalic origin and proximal segment with up to 50% stenosis. Mild plaque at the right CCA origin without stenosis. Tortuous proximal right CCA. Calcified plaque at the right carotid bifurcation and proximal right ICA. No significant cervical right ICA stenosis. Mild tortuosity. Left carotid system: Calcified plaque at the left CCA origin does not appear significant. Intermittent calcified plaque proximal to the bifurcation without stenosis. Bulky calcified plaque at the left ICA origin is stable since 2016 with less than 50 % stenosis with respect to the distal vessel. Tortuous cervical left ICA. Vertebral arteries: Proximal right subclavian artery plaque but no significant stenosis. The right vertebral origin is spared. The right vertebral artery is tortuous throughout the neck but patent to the skull base without stenosis. Bulky calcified plaque in the proximal left subclavian artery. Subsequent stenosis is roughly 50%. The left vertebral artery origin is largely spared. The left vertebral is mildly dominant and patent to the skull base without stenosis. CTA HEAD Posterior circulation: Bilateral V4 segment calcified plaque without significant stenosis. The left V4 segment is dominant. Normal bilateral PICA origins. Patent vertebrobasilar junction and basilar artery without stenosis. Normal SCA and PCA origins. Posterior communicating arteries are diminutive or absent. Bilateral PCA branches are stable and within normal limits. Anterior circulation: Both ICA siphons are patent but heavily calcified. On the right there is up to moderate stenosis in the supraclinoid segment. On the left there is moderate cavernous and supraclinoid stenosis. Patent carotid termini. Normal MCA and ACA origins. Anterior communicating artery and bilateral ACA branches are within normal limits aside from calcified atherosclerosis at the distal left ACA A2 or a 3  branch point (series 12, image 26), new since 2016. Right MCA M1 segment, bifurcation, and right MCA branches are stable and within normal limits. The left MCA M1 and left MCA trifurcation are patent without stenosis. No M2 occlusion is identified. But a posterior M3 branch of the middle M2 division is occluded as seen on series 12, image 35 and was patent on the 2016 CTA. Venous sinuses: Patent. Anatomic variants: Dominant left vertebral artery. Review of the MIP images confirms the above findings IMPRESSION: 1. Negative for LVO but positive for Left MCA M3 branch occlusion, and CTP detects 12 mL of ischemic penumbra in the corresponding posterior left MCA territory. No infarct core detected. This was reviewed by telephone with Dr. Roland Rack on 04/18/2018 at 0820 hours. 2. Extensive chronic large vessel calcified atherosclerosis in the head and neck, and Aortic atherosclerosis (ICD10-I70.0). - moderate stenoses of the bilateral ICA siphons. - no hemodynamically significant cervical ICA or vertebral artery stenosis. - up to 50% stenosis of multiple proximal great vessels. Electronically Signed   By: Genevie Ann M.D.   On: 04/18/2018  08:36   Kristina Summers 11 Contrast  Result Date: 04/18/2018 CLINICAL DATA:  83 year old female code stroke. Left MCA M3 branch occlusion on CTA / CTP. earlier today. Atrial fibrillation on Eliquis, not a candidate for IV tPA. EXAM: MRI HEAD WITHOUT CONTRAST TECHNIQUE: Multiplanar, multiecho pulse sequences of the Summers and surrounding structures were obtained without intravenous contrast. COMPARISON:  CTA CT perfusion 0802 hours today. Summers MRI 05/30/2014. FINDINGS: Summers: Scattered small cortical infarcts in the left MCA territory primarily along the left middle frontal gyrus along the posterior and superior margins of chronic encephalomalacia which is related to the 2016 infarct. There also is a small area of cortical infarction along the posterior left insula (series 3, image  24) and left inferior frontal operculum (series 3, image 26). Faint T2 and FLAIR hyperintensity in the affected parenchyma with no hemorrhage or mass effect. Superimposed small area of left parietal lobe and medial motor strip chronic cortical encephalomalacia on series 6, image 19. No contralateral right hemisphere or posterior circulation restricted diffusion. Nomidline shift, mass effect, evidence of mass lesion, ventriculomegaly, extra-axial collection or acute intracranial hemorrhage. Cervicomedullary junction and pituitary are within normal limits. Elsewhere in the Summers gray and white matter signal has not significantly changed since 2016. The deep gray matter nuclei, brainstem, and cerebellum remain normal for age. No chronic cerebral blood products identified. Vascular: Major intracranial vascular flow voids are stable since 2016. Skull and upper cervical spine: Negative for age visible cervical spine. Normal bone marrow signal. Sinuses/Orbits: Stable and negative. Other: Mastoids remain clear. Visible internal auditory structures appear normal. Scalp and face soft tissues are within normal limits. IMPRESSION: 1. Multiple small cortical infarcts scattered in the Left MCA territory from the posterior insula to the left middle frontal gyrus are compatible with the sequelae of the Left M3 branch occlusion demonstrated earlier today. 2. No associated hemorrhage or mass effect. No other acute vascular territory ischemia. 3. Superimposed chronic encephalomalacia in the left MCA territory. Electronically Signed   By: Genevie Ann M.D.   On: 04/18/2018 13:57   Ct Cerebral Perfusion W Contrast  Result Date: 04/18/2018 CLINICAL DATA:  83 year old female code stroke, right facial droop. EXAM: CT ANGIOGRAPHY HEAD AND NECK CT PERFUSION Summers TECHNIQUE: Multidetector CT imaging of the head and neck was performed using the standard protocol during bolus administration of intravenous contrast. Multiplanar CT image  reconstructions and MIPs were obtained to evaluate the vascular anatomy. Carotid stenosis measurements (when applicable) are obtained utilizing NASCET criteria, using the distal internal carotid diameter as the denominator. Multiphase CT imaging of the Summers was performed following IV bolus contrast injection. Subsequent parametric perfusion maps were calculated using RAPID software. CONTRAST:  113mL ISOVUE-370 IOPAMIDOL (ISOVUE-370) INJECTION 76% COMPARISON:  CT head without contrast 0753 hours today. Prior CTA head and neck 05/30/2014. FINDINGS: CT Summers Perfusion Findings: CBF (<30%) Volume: 75mL Perfusion (Tmax>6.0s) volume: 23mL Mismatch Volume: 31mL Infarction Location:Penumbra location is in the posterior left MCA territory CTA NECK Skeleton: Absent dentition. Prior sternotomy. Osteopenia. Cervical spine degeneration. No acute osseous abnormality identified. Upper chest: Chronic subpleural reticular opacity +/-paraseptal emphysema in both upper lungs. Superior mediastinal lymph nodes are within normal limits. Other neck: Stable and negative; atrophied or absent left submandibular gland as before. Aortic arch: Prior CABG. Extensive Calcified aortic atherosclerosis. 3 vessel arch configuration. Right carotid system: Bulky calcified plaque at the brachiocephalic origin and proximal segment with up to 50% stenosis. Mild plaque at the right CCA origin without stenosis. Tortuous proximal right CCA. Calcified  plaque at the right carotid bifurcation and proximal right ICA. No significant cervical right ICA stenosis. Mild tortuosity. Left carotid system: Calcified plaque at the left CCA origin does not appear significant. Intermittent calcified plaque proximal to the bifurcation without stenosis. Bulky calcified plaque at the left ICA origin is stable since 2016 with less than 50 % stenosis with respect to the distal vessel. Tortuous cervical left ICA. Vertebral arteries: Proximal right subclavian artery plaque but no  significant stenosis. The right vertebral origin is spared. The right vertebral artery is tortuous throughout the neck but patent to the skull base without stenosis. Bulky calcified plaque in the proximal left subclavian artery. Subsequent stenosis is roughly 50%. The left vertebral artery origin is largely spared. The left vertebral is mildly dominant and patent to the skull base without stenosis. CTA HEAD Posterior circulation: Bilateral V4 segment calcified plaque without significant stenosis. The left V4 segment is dominant. Normal bilateral PICA origins. Patent vertebrobasilar junction and basilar artery without stenosis. Normal SCA and PCA origins. Posterior communicating arteries are diminutive or absent. Bilateral PCA branches are stable and within normal limits. Anterior circulation: Both ICA siphons are patent but heavily calcified. On the right there is up to moderate stenosis in the supraclinoid segment. On the left there is moderate cavernous and supraclinoid stenosis. Patent carotid termini. Normal MCA and ACA origins. Anterior communicating artery and bilateral ACA branches are within normal limits aside from calcified atherosclerosis at the distal left ACA A2 or a 3 branch point (series 12, image 26), new since 2016. Right MCA M1 segment, bifurcation, and right MCA branches are stable and within normal limits. The left MCA M1 and left MCA trifurcation are patent without stenosis. No M2 occlusion is identified. But a posterior M3 branch of the middle M2 division is occluded as seen on series 12, image 35 and was patent on the 2016 CTA. Venous sinuses: Patent. Anatomic variants: Dominant left vertebral artery. Review of the MIP images confirms the above findings IMPRESSION: 1. Negative for LVO but positive for Left MCA M3 branch occlusion, and CTP detects 12 mL of ischemic penumbra in the corresponding posterior left MCA territory. No infarct core detected. This was reviewed by telephone with Dr.  Roland Rack on 04/18/2018 at 0820 hours. 2. Extensive chronic large vessel calcified atherosclerosis in the head and neck, and Aortic atherosclerosis (ICD10-I70.0). - moderate stenoses of the bilateral ICA siphons. - no hemodynamically significant cervical ICA or vertebral artery stenosis. - up to 50% stenosis of multiple proximal great vessels. Electronically Signed   By: Genevie Ann M.D.   On: 04/18/2018 08:36   Ct Head Code Stroke Wo Contrast  Result Date: 04/18/2018 CLINICAL DATA:  Code stroke. 83 year old female with right facial droop last seen normal 1730 hours yesterday. EXAM: CT HEAD WITHOUT CONTRAST TECHNIQUE: Contiguous axial images were obtained from the base of the skull through the vertex without intravenous contrast. COMPARISON:  04/10/2018 and earlier. FINDINGS: Summers: Left frontal operculum and middle frontal gyrus encephalomalacia appears stable from 1 week ago. No midline shift, mass effect, or evidence of intracranial mass lesion. No acute intracranial hemorrhage identified. No ventriculomegaly. No new cortical based infarct identified. Gray-white matter differentiation appears stable. Vascular: Extensive Calcified atherosclerosis at the skull base. No suspicious intracranial vascular hyperdensity. Skull: No acute osseous abnormality identified. Sinuses/Orbits: Visualized paranasal sinuses and mastoids are stable and well pneumatized. Other: No acute orbit or scalp soft tissue findings. ASPECTS [facility] Stroke Program Early CT Score) - Ganglionic level infarction (caudate, lentiform  nuclei, internal capsule, insula, M1-M3 cortex): 7 - Supraganglionic infarction (M4-M6 cortex): 3 Total score (0-10 with 10 being normal): 10 IMPRESSION: 1. Left frontal operculum and middle frontal gyrus encephalomalacia appears stable from 1 week ago. No acute intracranial hemorrhage or acute cortically based infarct identified. 2. ASPECTS is 10. 3. These results were communicated to Dr. Leonel Ramsay at [place] 12/31/2019by text page via the [facility] messaging system. Electronically Signed   By: Genevie Ann M.D.   On: 04/18/2018 08:15   Vas US Carotid (at [place] Only)  Result Date: 04/18/2018 Carotid Arterial Duplex Study Indications: CVA. Limitations: difficulty visualizing anatomy well due to tortuousity Performing Technologist: Abram Sander RVS  Examination Guidelines: A complete evaluation includes B-mode imaging, spectral Doppler, color Doppler, and power Doppler as needed of all accessible portions of each vessel. Bilateral testing is considered an integral part of a complete examination. Limited examinations for reoccurring indications may be performed as noted.  Right Carotid Findings: +----------+--------+--------+--------+------------+--------+           PSV cm/sEDV cm/sStenosisDescribe    Comments +----------+--------+--------+--------+------------+--------+ CCA Prox  62                      heterogenous         +----------+--------+--------+--------+------------+--------+ CCA Distal43                      heterogenous         +----------+--------+--------+--------+------------+--------+ ICA Prox  82      10      1-39%   heterogenous         +----------+--------+--------+--------+------------+--------+ ICA Distal79      8                                    +----------+--------+--------+--------+------------+--------+ ECA       146                                          +----------+--------+--------+--------+------------+--------+ +----------+--------+-------+--------+-------------------+           PSV cm/sEDV cmsDescribeArm Pressure (mmHG) +----------+--------+-------+--------+-------------------+ Subclavian118                                        +----------+--------+-------+--------+-------------------+ +---------+--------+--+--------+-+---------+ VertebralPSV cm/s42EDV cm/s5Antegrade +---------+--------+--+--------+-+---------+  Left  Carotid Findings: +----------+--------+--------+--------+------------+--------+           PSV cm/sEDV cm/sStenosisDescribe    Comments +----------+--------+--------+--------+------------+--------+ CCA Prox  91      7               heterogenous         +----------+--------+--------+--------+------------+--------+ CCA Distal70      8               heterogenous         +----------+--------+--------+--------+------------+--------+ ICA Prox  94      12      1-39%   heterogenous         +----------+--------+--------+--------+------------+--------+ ICA Distal82      12                                   +----------+--------+--------+--------+------------+--------+  ECA       75      6                                    +----------+--------+--------+--------+------------+--------+ +----------+--------+--------+--------+-------------------+ SubclavianPSV cm/sEDV cm/sDescribeArm Pressure (mmHG) +----------+--------+--------+--------+-------------------+           91                                          +----------+--------+--------+--------+-------------------+ +---------+--------+--+--------+--+---------+ VertebralPSV cm/s93EDV cm/s12Antegrade +---------+--------+--+--------+--+---------+  Summary: Right Carotid: Velocities in the right ICA are consistent with a 1-39% stenosis. Left Carotid: Velocities in the left ICA are consistent with a 1-39% stenosis. Vertebrals: Bilateral vertebral arteries demonstrate antegrade flow. *See table(s) above for measurements and observations.  Electronically signed by Servando Snare MD on 04/18/2018 at 5:10:01 PM.    Final         Scheduled Meds: . aspirin  300 mg Rectal Daily   Or  . aspirin  325 mg Oral Daily  . atorvastatin  80 mg Oral q1800  . enoxaparin (LOVENOX) injection  40 mg Subcutaneous Q24H  . insulin aspart  0-15 Units Subcutaneous TID WC  . insulin aspart  0-5 Units Subcutaneous QHS  . insulin aspart   10 Units Subcutaneous Once  . insulin aspart  4 Units Subcutaneous TID WC  . insulin NPH Human  12 Units Subcutaneous QAC supper  . insulin NPH Human  15 Units Subcutaneous QAC breakfast  . pantoprazole  40 mg Oral Daily   Continuous Infusions: . sodium chloride 50 mL/hr at 04/19/18 0422  . cefTRIAXone (ROCEPHIN)  IV Stopped (04/18/18 1827)   And  . metronidazole 500 mg (04/19/18 0954)     LOS: 1 day    Time spent: 34 minutes.     Hosie Poisson, MD [facility] Pager [phone]  If 7PM-7AM, please contact night-coverage [email or web address] Password [email or web address] 04/19/2018, 1:25 PM

## 2018-04-19 NOTE — Progress Notes (Addendum)
STROKE TEAM PROGRESS NOTE   INTERVAL HISTORY No family is at the bedside.  Stroke workup underway. Felt to be embolic in setting of subtherapeutic eliquis dosing.   Vitals:   04/18/18 1929 04/19/18 0004 04/19/18 0422 04/19/18 0901  BP: (!) 159/55 (!) 154/67 (!) 166/62 (!) 163/76  Pulse: 96 95 96 84  Resp: _0 Temp: 98.2 F (36.8 C) 99.8 F (37.7 C) 98.7 F (37.1 C) (!) 97.5 F (36.4 C)  TempSrc: Oral Oral Oral Oral  SpO2: 95% 96% 95%     CBC:  Recent Labs  Lab 04/15/18 0405 04/18/18 0747 04/18/18 0802 04/19/18 0729  WBC 9.2 15.9*  --  13.5*  NEUTROABS 8.0* 14.0*  --   --   HGB 8.6* 8.7* 9.9* 8.8*  HCT 27.5* 28.4* 29.0* 27.7*  MCV 86.8 85.8  --  86.8  PLT 211 163  --  063    Basic Metabolic Panel:  Recent Labs  Lab 04/18/18 0747 04/18/18 0802 04/19/18 0729  NA 132* 133* 138  K 4.1 4.0 4.0  CL 102 102 110  CO2 20*  --  21*  GLUCOSE 397* 413* 92  BUN 62* 56* 49*  CREATININE 1.64* 1.50* 1.36*  CALCIUM 8.6*  --  8.1*   Lipid Panel:     Component Value Date/Time   CHOL 150 04/19/2018 0427   CHOL 111 02/07/2018 1135   TRIG 80 04/19/2018 0427   HDL 45 04/19/2018 0427   HDL 33 (L) 02/07/2018 1135   CHOLHDL 3.3 04/19/2018 0427   VLDL 16 04/19/2018 0427   LDLCALC 89 04/19/2018 0427   LDLCALC 61 02/07/2018 1135   HgbA1c:  Lab Results  Component Value Date   HGBA1C 7.2 (H) 04/15/2018   Urine Drug Screen: No results found for: LABOPIA, COCAINSCRNUR, LABBENZ, AMPHETMU, THCU, LABBARB  Alcohol Level     Component Value Date/Time   ETH <10 04/18/2018 0747    IMAGING Ct Angio Head W Or Wo Contrast  Result Date: 04/18/2018 CLINICAL DATA:  83 year old female code stroke, right facial droop. EXAM: CT ANGIOGRAPHY HEAD AND NECK CT PERFUSION BRAIN TECHNIQUE: Multidetector CT imaging of the head and neck was performed using the standard protocol during bolus administration of intravenous contrast. Multiplanar CT image reconstructions and MIPs were  obtained to evaluate the vascular anatomy. Carotid stenosis measurements (when applicable) are obtained utilizing NASCET criteria, using the distal internal carotid diameter as the denominator. Multiphase CT imaging of the brain was performed following IV bolus contrast injection. Subsequent parametric perfusion maps were calculated using RAPID software. CONTRAST:  130m ISOVUE-370 IOPAMIDOL (ISOVUE-370) INJECTION 76% COMPARISON:  CT head without contrast 0753 hours today. Prior CTA head and neck 05/30/2014. FINDINGS: CT Brain Perfusion Findings: CBF (<30%) Volume: 061mPerfusion (Tmax>6.0s) volume: 1219mismatch Volume: 40m35mfarction Location:Penumbra location is in the posterior left MCA territory CTA NECK Skeleton: Absent dentition. Prior sternotomy. Osteopenia. Cervical spine degeneration. No acute osseous abnormality identified. Upper chest: Chronic subpleural reticular opacity +/-paraseptal emphysema in both upper lungs. Superior mediastinal lymph nodes are within normal limits. Other neck: Stable and negative; atrophied or absent left submandibular gland as before. Aortic arch: Prior CABG. Extensive Calcified aortic atherosclerosis. 3 vessel arch configuration. Right carotid system: Bulky calcified plaque at the brachiocephalic origin and proximal segment with up to 50% stenosis. Mild plaque at the right CCA origin without stenosis. Tortuous proximal right CCA. Calcified plaque at the right carotid bifurcation and proximal right ICA. No significant cervical right ICA stenosis. Mild  tortuosity. Left carotid system: Calcified plaque at the left CCA origin does not appear significant. Intermittent calcified plaque proximal to the bifurcation without stenosis. Bulky calcified plaque at the left ICA origin is stable since 2016 with less than 50 % stenosis with respect to the distal vessel. Tortuous cervical left ICA. Vertebral arteries: Proximal right subclavian artery plaque but no significant stenosis. The  right vertebral origin is spared. The right vertebral artery is tortuous throughout the neck but patent to the skull base without stenosis. Bulky calcified plaque in the proximal left subclavian artery. Subsequent stenosis is roughly 50%. The left vertebral artery origin is largely spared. The left vertebral is mildly dominant and patent to the skull base without stenosis. CTA HEAD Posterior circulation: Bilateral V4 segment calcified plaque without significant stenosis. The left V4 segment is dominant. Normal bilateral PICA origins. Patent vertebrobasilar junction and basilar artery without stenosis. Normal SCA and PCA origins. Posterior communicating arteries are diminutive or absent. Bilateral PCA branches are stable and within normal limits. Anterior circulation: Both ICA siphons are patent but heavily calcified. On the right there is up to moderate stenosis in the supraclinoid segment. On the left there is moderate cavernous and supraclinoid stenosis. Patent carotid termini. Normal MCA and ACA origins. Anterior communicating artery and bilateral ACA branches are within normal limits aside from calcified atherosclerosis at the distal left ACA A2 or a 3 branch point (series 12, image 26), new since 2016. Right MCA M1 segment, bifurcation, and right MCA branches are stable and within normal limits. The left MCA M1 and left MCA trifurcation are patent without stenosis. No M2 occlusion is identified. But a posterior M3 branch of the middle M2 division is occluded as seen on series 12, image 35 and was patent on the 2016 CTA. Venous sinuses: Patent. Anatomic variants: Dominant left vertebral artery. Review of the MIP images confirms the above findings IMPRESSION: 1. Negative for LVO but positive for Left MCA M3 branch occlusion, and CTP detects 12 mL of ischemic penumbra in the corresponding posterior left MCA territory. No infarct core detected. This was reviewed by telephone with Dr. Roland Rack on  04/18/2018 at 0820 hours. 2. Extensive chronic large vessel calcified atherosclerosis in the head and neck, and Aortic atherosclerosis (ICD10-I70.0). - moderate stenoses of the bilateral ICA siphons. - no hemodynamically significant cervical ICA or vertebral artery stenosis. - up to 50% stenosis of multiple proximal great vessels. Electronically Signed   By: Genevie Ann M.D.   On: 04/18/2018 08:36   Ct Angio Neck W Or Wo Contrast  Result Date: 04/18/2018 CLINICAL DATA:  83 year old female code stroke, right facial droop. EXAM: CT ANGIOGRAPHY HEAD AND NECK CT PERFUSION BRAIN TECHNIQUE: Multidetector CT imaging of the head and neck was performed using the standard protocol during bolus administration of intravenous contrast. Multiplanar CT image reconstructions and MIPs were obtained to evaluate the vascular anatomy. Carotid stenosis measurements (when applicable) are obtained utilizing NASCET criteria, using the distal internal carotid diameter as the denominator. Multiphase CT imaging of the brain was performed following IV bolus contrast injection. Subsequent parametric perfusion maps were calculated using RAPID software. CONTRAST:  120m ISOVUE-370 IOPAMIDOL (ISOVUE-370) INJECTION 76% COMPARISON:  CT head without contrast 0753 hours today. Prior CTA head and neck 05/30/2014. FINDINGS: CT Brain Perfusion Findings: CBF (<30%) Volume: 022mPerfusion (Tmax>6.0s) volume: 1236mismatch Volume: 63m39mfarction Location:Penumbra location is in the posterior left MCA territory CTA NECK Skeleton: Absent dentition. Prior sternotomy. Osteopenia. Cervical spine degeneration. No acute osseous abnormality  identified. Upper chest: Chronic subpleural reticular opacity +/-paraseptal emphysema in both upper lungs. Superior mediastinal lymph nodes are within normal limits. Other neck: Stable and negative; atrophied or absent left submandibular gland as before. Aortic arch: Prior CABG. Extensive Calcified aortic atherosclerosis. 3  vessel arch configuration. Right carotid system: Bulky calcified plaque at the brachiocephalic origin and proximal segment with up to 50% stenosis. Mild plaque at the right CCA origin without stenosis. Tortuous proximal right CCA. Calcified plaque at the right carotid bifurcation and proximal right ICA. No significant cervical right ICA stenosis. Mild tortuosity. Left carotid system: Calcified plaque at the left CCA origin does not appear significant. Intermittent calcified plaque proximal to the bifurcation without stenosis. Bulky calcified plaque at the left ICA origin is stable since 2016 with less than 50 % stenosis with respect to the distal vessel. Tortuous cervical left ICA. Vertebral arteries: Proximal right subclavian artery plaque but no significant stenosis. The right vertebral origin is spared. The right vertebral artery is tortuous throughout the neck but patent to the skull base without stenosis. Bulky calcified plaque in the proximal left subclavian artery. Subsequent stenosis is roughly 50%. The left vertebral artery origin is largely spared. The left vertebral is mildly dominant and patent to the skull base without stenosis. CTA HEAD Posterior circulation: Bilateral V4 segment calcified plaque without significant stenosis. The left V4 segment is dominant. Normal bilateral PICA origins. Patent vertebrobasilar junction and basilar artery without stenosis. Normal SCA and PCA origins. Posterior communicating arteries are diminutive or absent. Bilateral PCA branches are stable and within normal limits. Anterior circulation: Both ICA siphons are patent but heavily calcified. On the right there is up to moderate stenosis in the supraclinoid segment. On the left there is moderate cavernous and supraclinoid stenosis. Patent carotid termini. Normal MCA and ACA origins. Anterior communicating artery and bilateral ACA branches are within normal limits aside from calcified atherosclerosis at the distal left ACA  A2 or a 3 branch point (series 12, image 26), new since 2016. Right MCA M1 segment, bifurcation, and right MCA branches are stable and within normal limits. The left MCA M1 and left MCA trifurcation are patent without stenosis. No M2 occlusion is identified. But a posterior M3 branch of the middle M2 division is occluded as seen on series 12, image 35 and was patent on the 2016 CTA. Venous sinuses: Patent. Anatomic variants: Dominant left vertebral artery. Review of the MIP images confirms the above findings IMPRESSION: 1. Negative for LVO but positive for Left MCA M3 branch occlusion, and CTP detects 12 mL of ischemic penumbra in the corresponding posterior left MCA territory. No infarct core detected. This was reviewed by telephone with Dr. Roland Rack on 04/18/2018 at 0820 hours. 2. Extensive chronic large vessel calcified atherosclerosis in the head and neck, and Aortic atherosclerosis (ICD10-I70.0). - moderate stenoses of the bilateral ICA siphons. - no hemodynamically significant cervical ICA or vertebral artery stenosis. - up to 50% stenosis of multiple proximal great vessels. Electronically Signed   By: Genevie Ann M.D.   On: 04/18/2018 08:36   Mr Brain Wo Contrast  Result Date: 04/18/2018 CLINICAL DATA:  83 year old female code stroke. Left MCA M3 branch occlusion on CTA / CTP. earlier today. Atrial fibrillation on Eliquis, not a candidate for IV tPA. EXAM: MRI HEAD WITHOUT CONTRAST TECHNIQUE: Multiplanar, multiecho pulse sequences of the brain and surrounding structures were obtained without intravenous contrast. COMPARISON:  CTA CT perfusion 0802 hours today. Brain MRI 05/30/2014. FINDINGS: Brain: Scattered small cortical infarcts  in the left MCA territory primarily along the left middle frontal gyrus along the posterior and superior margins of chronic encephalomalacia which is related to the 2016 infarct. There also is a small area of cortical infarction along the posterior left insula (series  3, image 24) and left inferior frontal operculum (series 3, image 26). Faint T2 and FLAIR hyperintensity in the affected parenchyma with no hemorrhage or mass effect. Superimposed small area of left parietal lobe and medial motor strip chronic cortical encephalomalacia on series 6, image 19. No contralateral right hemisphere or posterior circulation restricted diffusion. Nomidline shift, mass effect, evidence of mass lesion, ventriculomegaly, extra-axial collection or acute intracranial hemorrhage. Cervicomedullary junction and pituitary are within normal limits. Elsewhere in the brain gray and white matter signal has not significantly changed since 2016. The deep gray matter nuclei, brainstem, and cerebellum remain normal for age. No chronic cerebral blood products identified. Vascular: Major intracranial vascular flow voids are stable since 2016. Skull and upper cervical spine: Negative for age visible cervical spine. Normal bone marrow signal. Sinuses/Orbits: Stable and negative. Other: Mastoids remain clear. Visible internal auditory structures appear normal. Scalp and face soft tissues are within normal limits. IMPRESSION: 1. Multiple small cortical infarcts scattered in the Left MCA territory from the posterior insula to the left middle frontal gyrus are compatible with the sequelae of the Left M3 branch occlusion demonstrated earlier today. 2. No associated hemorrhage or mass effect. No other acute vascular territory ischemia. 3. Superimposed chronic encephalomalacia in the left MCA territory. Electronically Signed   By: Genevie Ann M.D.   On: 04/18/2018 13:57   Ct Cerebral Perfusion W Contrast  Result Date: 04/18/2018 CLINICAL DATA:  83 year old female code stroke, right facial droop. EXAM: CT ANGIOGRAPHY HEAD AND NECK CT PERFUSION BRAIN TECHNIQUE: Multidetector CT imaging of the head and neck was performed using the standard protocol during bolus administration of intravenous contrast. Multiplanar CT image  reconstructions and MIPs were obtained to evaluate the vascular anatomy. Carotid stenosis measurements (when applicable) are obtained utilizing NASCET criteria, using the distal internal carotid diameter as the denominator. Multiphase CT imaging of the brain was performed following IV bolus contrast injection. Subsequent parametric perfusion maps were calculated using RAPID software. CONTRAST:  182m ISOVUE-370 IOPAMIDOL (ISOVUE-370) INJECTION 76% COMPARISON:  CT head without contrast 0753 hours today. Prior CTA head and neck 05/30/2014. FINDINGS: CT Brain Perfusion Findings: CBF (<30%) Volume: 055mPerfusion (Tmax>6.0s) volume: 1219mismatch Volume: 20m15mfarction Location:Penumbra location is in the posterior left MCA territory CTA NECK Skeleton: Absent dentition. Prior sternotomy. Osteopenia. Cervical spine degeneration. No acute osseous abnormality identified. Upper chest: Chronic subpleural reticular opacity +/-paraseptal emphysema in both upper lungs. Superior mediastinal lymph nodes are within normal limits. Other neck: Stable and negative; atrophied or absent left submandibular gland as before. Aortic arch: Prior CABG. Extensive Calcified aortic atherosclerosis. 3 vessel arch configuration. Right carotid system: Bulky calcified plaque at the brachiocephalic origin and proximal segment with up to 50% stenosis. Mild plaque at the right CCA origin without stenosis. Tortuous proximal right CCA. Calcified plaque at the right carotid bifurcation and proximal right ICA. No significant cervical right ICA stenosis. Mild tortuosity. Left carotid system: Calcified plaque at the left CCA origin does not appear significant. Intermittent calcified plaque proximal to the bifurcation without stenosis. Bulky calcified plaque at the left ICA origin is stable since 2016 with less than 50 % stenosis with respect to the distal vessel. Tortuous cervical left ICA. Vertebral arteries: Proximal right subclavian artery plaque but  no  significant stenosis. The right vertebral origin is spared. The right vertebral artery is tortuous throughout the neck but patent to the skull base without stenosis. Bulky calcified plaque in the proximal left subclavian artery. Subsequent stenosis is roughly 50%. The left vertebral artery origin is largely spared. The left vertebral is mildly dominant and patent to the skull base without stenosis. CTA HEAD Posterior circulation: Bilateral V4 segment calcified plaque without significant stenosis. The left V4 segment is dominant. Normal bilateral PICA origins. Patent vertebrobasilar junction and basilar artery without stenosis. Normal SCA and PCA origins. Posterior communicating arteries are diminutive or absent. Bilateral PCA branches are stable and within normal limits. Anterior circulation: Both ICA siphons are patent but heavily calcified. On the right there is up to moderate stenosis in the supraclinoid segment. On the left there is moderate cavernous and supraclinoid stenosis. Patent carotid termini. Normal MCA and ACA origins. Anterior communicating artery and bilateral ACA branches are within normal limits aside from calcified atherosclerosis at the distal left ACA A2 or a 3 branch point (series 12, image 26), new since 2016. Right MCA M1 segment, bifurcation, and right MCA branches are stable and within normal limits. The left MCA M1 and left MCA trifurcation are patent without stenosis. No M2 occlusion is identified. But a posterior M3 branch of the middle M2 division is occluded as seen on series 12, image 35 and was patent on the 2016 CTA. Venous sinuses: Patent. Anatomic variants: Dominant left vertebral artery. Review of the MIP images confirms the above findings IMPRESSION: 1. Negative for LVO but positive for Left MCA M3 branch occlusion, and CTP detects 12 mL of ischemic penumbra in the corresponding posterior left MCA territory. No infarct core detected. This was reviewed by telephone with Dr.  Roland Rack on 04/18/2018 at 0820 hours. 2. Extensive chronic large vessel calcified atherosclerosis in the head and neck, and Aortic atherosclerosis (ICD10-I70.0). - moderate stenoses of the bilateral ICA siphons. - no hemodynamically significant cervical ICA or vertebral artery stenosis. - up to 50% stenosis of multiple proximal great vessels. Electronically Signed   By: Genevie Ann M.D.   On: 04/18/2018 08:36   Ct Head Code Stroke Wo Contrast  Result Date: 04/18/2018 CLINICAL DATA:  Code stroke. 83 year old female with right facial droop last seen normal 1730 hours yesterday. EXAM: CT HEAD WITHOUT CONTRAST TECHNIQUE: Contiguous axial images were obtained from the base of the skull through the vertex without intravenous contrast. COMPARISON:  04/10/2018 and earlier. FINDINGS: Brain: Left frontal operculum and middle frontal gyrus encephalomalacia appears stable from 1 week ago. No midline shift, mass effect, or evidence of intracranial mass lesion. No acute intracranial hemorrhage identified. No ventriculomegaly. No new cortical based infarct identified. Gray-white matter differentiation appears stable. Vascular: Extensive Calcified atherosclerosis at the skull base. No suspicious intracranial vascular hyperdensity. Skull: No acute osseous abnormality identified. Sinuses/Orbits: Visualized paranasal sinuses and mastoids are stable and well pneumatized. Other: No acute orbit or scalp soft tissue findings. ASPECTS [facility] Stroke Program Early CT Score) - Ganglionic level infarction (caudate, lentiform nuclei, internal capsule, insula, M1-M3 cortex): 7 - Supraganglionic infarction (M4-M6 cortex): 3 Total score (0-10 with 10 being normal): 10 IMPRESSION: 1. Left frontal operculum and middle frontal gyrus encephalomalacia appears stable from 1 week ago. No acute intracranial hemorrhage or acute cortically based infarct identified. 2. ASPECTS is 10. 3. These results were communicated to Dr. Leonel Ramsay at [facility] 12/31/2019by text page via the [facility] messaging system. Electronically Signed   By: Lemmie Evens  [place] Crane M.D.   On: 04/18/2018 08:15   Vas US Carotid (at [place] Only)  Result Date: 04/18/2018 Carotid Arterial Duplex Study Indications: CVA. Limitations: difficulty visualizing anatomy well due to tortuousity Performing Technologist: Abram Sander RVS  Examination Guidelines: A complete evaluation includes B-mode imaging, spectral Doppler, color Doppler, and power Doppler as needed of all accessible portions of each vessel. Bilateral testing is considered an integral part of a complete examination. Limited examinations for reoccurring indications may be performed as noted.  Right Carotid Findings: +----------+--------+--------+--------+------------+--------+           PSV cm/sEDV cm/sStenosisDescribe    Comments +----------+--------+--------+--------+------------+--------+ CCA Prox  62                      heterogenous         +----------+--------+--------+--------+------------+--------+ CCA Distal43                      heterogenous         +----------+--------+--------+--------+------------+--------+ ICA Prox  82      10      1-39%   heterogenous         +----------+--------+--------+--------+------------+--------+ ICA Distal79      8                                    +----------+--------+--------+--------+------------+--------+ ECA       146                                          +----------+--------+--------+--------+------------+--------+ +----------+--------+-------+--------+-------------------+           PSV cm/sEDV cmsDescribeArm Pressure (mmHG) +----------+--------+-------+--------+-------------------+ Subclavian118                                        +----------+--------+-------+--------+-------------------+ +---------+--------+--+--------+-+---------+ VertebralPSV cm/s42EDV cm/s5Antegrade +---------+--------+--+--------+-+---------+  Left  Carotid Findings: +----------+--------+--------+--------+------------+--------+           PSV cm/sEDV cm/sStenosisDescribe    Comments +----------+--------+--------+--------+------------+--------+ CCA Prox  91      7               heterogenous         +----------+--------+--------+--------+------------+--------+ CCA Distal70      8               heterogenous         +----------+--------+--------+--------+------------+--------+ ICA Prox  94      12      1-39%   heterogenous         +----------+--------+--------+--------+------------+--------+ ICA Distal82      12                                   +----------+--------+--------+--------+------------+--------+ ECA       75      6                                    +----------+--------+--------+--------+------------+--------+ +----------+--------+--------+--------+-------------------+ SubclavianPSV cm/sEDV cm/sDescribeArm Pressure (mmHG) +----------+--------+--------+--------+-------------------+           91                                          +----------+--------+--------+--------+-------------------+ +---------+--------+--+--------+--+---------+  VertebralPSV cm/s93EDV cm/s12Antegrade +---------+--------+--+--------+--+---------+  Summary: Right Carotid: Velocities in the right ICA are consistent with a 1-39% stenosis. Left Carotid: Velocities in the left ICA are consistent with a 1-39% stenosis. Vertebrals: Bilateral vertebral arteries demonstrate antegrade flow. *See table(s) above for measurements and observations.  Electronically signed by Servando Snare MD on 04/18/2018 at 5:10:01 PM.    Final    2D Echocardiogram  - Left ventricle: The cavity size was mildly dilated. Wall   thickness was normal. Systolic function was at the lower limits   of normal. The estimated ejection fraction was in the range of   50% to 55%. Wall motion was normal; there were no regional wall   motion abnormalities. -  Ventricular septum: Septal motion showed paradox. These changes   are consistent with a left bundle branch block. - Aortic valve: A stent-valve (TAVR) bioprosthesis was present and   functioning normally. There was trivial regurgitation. There was   trivial perivalvular regurgitation. Valve area (VTI): 1.73 cm^2.   Valve area (Vmax): 1.83 cm^2. Valve area (Vmean): 1.87 cm^2. - Mitral valve: There was mild regurgitation. - Left atrium: The atrium was mildly dilated. - Right ventricle: Systolic function was reduced. - Right atrium: The atrium was mildly dilated. - Pulmonary arteries: Systolic pressure was mildly increased. PA   peak pressure: 40 mm Hg (S).  Carotid Doppler   There is 1-39% bilateral ICA stenosis. Vertebral artery flow is antegrade.     PHYSICAL EXAM Constitutional: Appears well-developed and well-nourished.  Psych: Affect appropriate to situation Eyes: No scleral injection,  HENT: No OP obstrucion Head: Normocephalic.  Cardiovascular: Normal rate and regular rhythm.  Respiratory: Effort normal, non-labored breathing Skin: WDI  Neuro: Mental Status: Patient is awake, alert, expressive > receptive aphasia. She does follow most commands, difficulty with 3-step ones. Attempts to speak but minimal output and what she does say is unformed sounds.  Cranial Nerves: II: she is legally blind at baseline. does not clearly blink from either side, but seems to see the left better than right, suspect R hemianopia, does fixate and track.  Pupils are equal, round, and reactive to light.   III,IV, VI: EOMI without ptosis or diploplia.  VII: Facial movement with right facial weakness VIII: hearing is intact to voice X: Uvula elevates symmetrically XI: Shoulder shrug is symmetric. XII: tongue is midline without atrophy or fasciculations.  Motor: She has 4+/5 strength with mild drift in the right arm, 3/5 R leg, good strength on the left.  Sensory: Responds to nox stim x 4.   Cerebellar: FNF are intact bilaterally   ASSESSMENT/PLAN Ms. Tanikka Bresnan is a 83 y.o. female with history of previous stroke d/t AF on Eliquis with recent hospitalization for HA w/ elevated ESR, treated for temporal arteritis, PVD, DB, CAD s/p CABG, CKD, dCHF, HTN, HLD, legal blindness now presenting with inability to speak and R leg weakness.   Stroke:  Mult small scattered left MCA infarcts c/w L M2/M3 occlusion, infarcts embolic secondary to known atrial fibrillation on low dose eliquis (2.5 bid)  Code Stroke CT head L frontal operculum and middle frontal gyrus encephalmalacia stable from 1 weeks ago. No acute stroke. ASPECTS 10.     CTA head & neck no LVO. L M3 branch occlusion, extensive large vessel disease (mod B ICA siphons, up to 50% mult prox great vessels). Aortic atherosclerosis.   CT perfusion 47m penumbra L MCA territory without core infarct  MRI  Mult sm scattered cortical L MCA territory infarcts c/w  L MCA occlusion. Superimposed encephalomalacia L MCA territory  Carotid Doppler  B ICA 1-39% stenosis, VAs antegrade   2D Echo  EF 50-55%. No source of embolus, TAVR in place and functioning  LDL 89  HgbA1c 7.2  Lovenox 40 mg sq daily for VTE prophylaxis - will d/c with addition of eliquis  Eliquis (apixaban) daily 2.5 mg bid prior to admission, now on aspirin 325 mg daily. Recommend d/c aspirin and change to eliquis 5 mg bid. Ok to start today.  Therapy recommendations:  SNF  Disposition:  pending (was from home w/ [facility])  Atrial Fibrillation s/p TAVR  Home anticoagulation:  Eliquis (apixaban) daily 2.5 bid  Now on aspirin 325  Cr 1.37. meets criteria for normal dose eliquis  Recommend increase eliquis dose to 5 mg bid and d/c aspirin &  lovenox - orders placed . Continue Eliquis (apixaban) daily 5 mg bid at discharge   Hypertension  Stable . Permissive hypertension (OK if < 220/120) but gradually normalize in 5-7 days . Long-term BP goal  normotensive  Hyperlipidemia  Home meds:  lipitor 80, resumed in hospital  LDL 89, goal < 70  Continue statin at discharge  Diabetes type II  HgbA1c 7.2, goal < 7.0  Uncontrolled  Other Stroke Risk Factors  Advanced age  Former Cigarette smoker  Hx ETOH use, none now  Obesity, There is no height or weight on file to calculate BMI., recommend weight loss, diet and exercise as appropriate   Hx stroke/TIA  05/2014 L MCA d/t AF started on [facility]  Coronary artery disease s/p CABG  Hx Carotid disease, no stenosis per doppler  Chronic systolic Congestive heart failure  PVD  TAVR  Other Active Problems  Fibromyalgia  GERD  Hx GI bleeding, had EGD and capsule study with no signs of bleeding during admission last week  CKD  R fott ulcer w/ cellulitis  Presumed temporal arteritis (recent bx neg but improvement on prednisone). Continue prednisone    NOTHING FURTHER TO ADD FROM THE STROKE STANDPOINT  Patient has a 10-15% risk of having another stroke over the next year, the highest risk is within 2 weeks of the most recent stroke/TIA (risk of having a stroke following a stroke or TIA is the same).  Ongoing risk factor control by Primary Care Physician  Stroke Service will sign off. Please call should any needs arise.  Followed by [name] PTA for temporal arteritis.   Follow-up Stroke Clinic with [name] at [facility] in 4 weeks, order placed.   Hospital day # 1  Burnetta Sabin, MSN, APRN, ANVP-BC, AGPCNP-BC Advanced Practice Stroke Nurse [place] for Schedule & Pager information 04/19/2018 3:59 PM   ATTENDING NOTE: I reviewed above note and agree with the assessment and plan. Pt was seen and examined.   83 year old female with history of CAD status post CABG 2000, obesity, status post TAVR, CKD, CHF, hypertension, hyperlipidemia, DM, PAD, A. fib on Eliquis, stroke in 2016, recent diagnosis of temporal  arteritis on prednisone admitted for right-sided weakness and aphasia.  Patient had stroke in 05/2014 with aphasia, MRI showed left MCA frontal small infarct.  MRA negative, carotid Doppler negative.  LDL 48 and A1c 10.4.  Put on Eliquis 5 mg twice daily.  However, over time her Eliquis changed from 5 mg twice daily to 2.5 mg twice daily.  Recently she had temporal headache and ESR as well as CRP was high.  ESR 127 and CRP 11.4.  She was admitted on 04/10/2018 for temporal biopsy which was negative.  However, patient was put on prednisone 60 mg daily.  And repeat CRP from 11.4 down to 1.9 now 4.6 and ESR from 127 down to 95.  Patient headache resolved.  Patient was discharged with prednisone 50 mg daily.  And so far patient still remains headache free.  Will continue prednisone 50 mg daily and recommend her to follow-up with [facility] [name] for further management of temporal arteritis.  On this admission, patient had CT showed left MCA old infarct.  CT head neck showed left M2/M3 occlusion and diffuse atherosclerosis as well as moderate bilateral siphon stenosis.  MRI showed left insular cortex and left MCA frontal scattered infarcts.  EF 50 to 50%.  Carotid Doppler negative.  She had TEE in 11/2017 showed no endocarditis but positive of small PFO.  Creatinine improved from 1.64-1.36.   Currently, patient still has expressive aphasia, left gaze preference and right hemiparesis.  Her stroke embolic pattern, most likely due to A. fib on low-dose Eliquis.  Patient age at 89, however weight more than 60 kg and creatinine less than 1.5, therefore patient will be on Eliquis 5 mg twice daily.  Given the small size of his stroke, will start Eliquis 5 mg a day and DC aspirin.  Continue Lipitor 80.  Stroke risk factor modification.  Continue PT/OT.  Neurology will sign off. Please call with questions. Pt will follow up with [name] at [facility] in about 4 weeks. Thanks for the consult.   Rosalin Hawking, MD PhD Stroke  Neurology 04/19/2018 7:07 PM  I spent  35 minutes in total face-to-face time with the patient, more than 50% of which was spent in counseling and coordination of care, reviewing test results, images and medication, and discussing the diagnosis of left MCA stroke, left MCA occlusion, temporal arteritis, uncontrolled diabetes, leukocytosis, history of stroke, treatment plan and potential prognosis. This patient's care requiresreview of multiple databases, neurological assessment, discussion with family, other specialists and medical decision making of high complexity.        To contact Stroke Continuity provider, please refer to [email or web address] After hours, contact General Neurology

## 2018-04-19 NOTE — Progress Notes (Signed)
CSW acknowledging consult for "placement". Per chart review, still pending therapy evaluations. Patient will require a 3 midnight qualifying stay if SNF placement is needed. CSW to follow and assess for discharge needs as appropriate after further completion of medical workup.  Laveda Abbe, [place] Clinical Social Worker [phone]

## 2018-04-19 NOTE — Evaluation (Signed)
Occupational Therapy Evaluation Patient Details Name: Kristina Summers MRN: [id number] DOB: Feb 04, 1936 Today's Date: 04/19/2018    History of Present Illness Pt is a 83 y.o. [place] female with medical history significant of DM; PVD; afib on Elquis; s/p TAVR 11/08/17; HTN; HLD; CVA; diastolic CHF; CAD s/p CABG; and CKD presenting with aphasia, facial droop, R sided weakness recent hospitalizations include: 12/2 to 63/7/85 for diastolic CHF, and 88/50-27/74.   Clinical Impression   PTA Pt living independently, some assist for IADL from children - independent with DME for mobility and ADL. Pt is legally blind but functions well in her familiar environment. Pt is currently EXTREMELY aphasic, but did well with yes and no questions. min A for transfers for balance and boost. R sided weakness (grossly 3/5 - see below for more specific details) and inattention. Pt is overall mod A for ADL. Pt will benefit from skilled OT in the acute setting as well as afterwards at the SNF level to maximize safety and independence in ADL and functional transfers. Next session to focus on functional grooming tasks and seated balance.  NOTE: there is no recliner in the room at this time. RN staff was requested to locate one. By PT as well.     Follow Up Recommendations  SNF    Equipment Recommendations  Other (comment)(defer to next venue)    Recommendations for Other Services       Precautions / Restrictions Precautions Precautions: Fall Precaution Comments: severe aphasia, legally blind Restrictions Weight Bearing Restrictions: No      Mobility Bed Mobility Overal bed mobility: Modified Independent             General bed mobility comments: used rail but able to come to eOB without assist  Transfers Overall transfer level: Needs assistance Equipment used: None Transfers: Sit to/from Stand Sit to Stand: Min assist         General transfer comment: Pt needed assist to stand from bed.  small side  steps up the bed for repositioning    Balance Overall balance assessment: Needs assistance Sitting-balance support: No upper extremity supported;Feet supported;Single extremity supported Sitting balance-Leahy Scale: Fair     Standing balance support: Bilateral upper extremity supported;During functional activity Standing balance-Leahy Scale: Poor Standing balance comment: Needed Bil UE support to stand for stability due to right hemibody weakness/inattention                           ADL either performed or assessed with clinical judgement   ADL Overall ADL's : Needs assistance/impaired Eating/Feeding: Moderate assistance;Sitting Eating/Feeding Details (indicate cue type and reason): able to self-feed with positional set-up with LUE, requires constant assist  Grooming: Moderate assistance;Bed level   Upper Body Bathing: Moderate assistance;Bed level   Lower Body Bathing: Maximal assistance;Sitting/lateral leans   Upper Body Dressing : Maximal assistance   Lower Body Dressing: Total assistance                       Vision         Perception     Praxis      Pertinent Vitals/Pain Pain Assessment: No/denies pain Faces Pain Scale: No hurt Pain Intervention(s): Monitored during session     Hand Dominance Right   Extremity/Trunk Assessment Upper Extremity Assessment Upper Extremity Assessment: RUE deficits/detail;Generalized weakness RUE Deficits / Details: edema from earlier IV infiltration; grasp 1/5, wrist 2/5, elbow 3+/5, FF 3+/5 able to bring up to  chin but no further RUE Sensation: decreased light touch;decreased proprioception RUE Coordination: decreased fine motor;decreased gross motor   Lower Extremity Assessment Lower Extremity Assessment: Defer to PT evaluation   Cervical / Trunk Assessment Cervical / Trunk Assessment: Normal   Communication Communication Communication: Expressive difficulties   Cognition Arousal/Alertness:  Awake/alert Behavior During Therapy: WFL for tasks assessed/performed Overall Cognitive Status: No family/caregiver present to determine baseline cognitive functioning Area of Impairment: Attention                   Current Attention Level: Selective           General Comments: Pt appears to have inattention to right hemibody.   General Comments       Exercises     Shoulder Instructions      Home Living Family/patient expects to be discharged to:: Private residence Living Arrangements: Alone Available Help at Discharge: Family;Available PRN/intermittently Type of Home: Apartment Home Access: Stairs to enter Entrance Stairs-Number of Steps: 1 Entrance Stairs-Rails: Right Home Layout: One level     Bathroom Shower/Tub: Teacher, early years/pre: Standard     Home Equipment: Environmental consultant - 4 wheels;Cane - single point;Tub bench;Grab bars - tub/shower   Additional Comments: Senior living apartment  Lives With: Alone    Prior Functioning/Environment Level of Independence: Independent with assistive device(s)  Gait / Transfers Assistance Needed: Household ambulation without DME. Uses rollator for community ambulation. Enjoys walking to community center nearby ADL's / Homemaking Assistance Needed: independent with ADLs, light meal prep, Daughter drives for errands; uses transportation to MD appointments   Comments: Poor vision; typically only walks in familiar environments        OT Problem List: Decreased strength;Decreased activity tolerance;Decreased knowledge of use of DME or AE;Impaired balance (sitting and/or standing);Impaired vision/perception;Impaired sensation;Obesity;Impaired UE functional use;Increased edema      OT Treatment/Interventions: Self-care/ADL training;Therapeutic exercise;Therapeutic activities;Patient/family education;Energy conservation;DME and/or AE instruction    OT Goals(Current goals can be found in the care plan section) Acute  Rehab OT Goals Patient Stated Goal: get therapy to get better OT Goal Formulation: With patient Time For Goal Achievement: 05/03/18 Potential to Achieve Goals: Good ADL Goals Pt Will Perform Eating: with set-up;with adaptive utensils;sitting Pt Will Perform Grooming: with set-up;with adaptive equipment;sitting Pt Will Perform Upper Body Bathing: with set-up;with adaptive equipment;sitting Pt Will Perform Lower Body Bathing: with min guard assist;sit to/from stand Pt Will Transfer to Toilet: with min assist;ambulating Pt Will Perform Toileting - Clothing Manipulation and hygiene: with set-up;sitting/lateral leans  OT Frequency: Min 2X/week   Barriers to D/C:            Co-evaluation              AM-PAC OT "6 Clicks" Daily Activity     Outcome Measure Help from another person eating meals?: A Lot Help from another person taking care of personal grooming?: A Lot Help from another person toileting, which includes using toliet, bedpan, or urinal?: A Lot Help from another person bathing (including washing, rinsing, drying)?: A Lot Help from another person to put on and taking off regular upper body clothing?: A Lot Help from another person to put on and taking off regular lower body clothing?: A Lot 6 Click Score: 12   End of Session Equipment Utilized During Treatment: Gait belt Nurse Communication: Mobility status  Activity Tolerance: Patient tolerated treatment well Patient left: in bed;with call bell/phone within reach;with bed alarm set  OT Visit Diagnosis: Unsteadiness on feet (  R26.81);Other abnormalities of gait and mobility (R26.89);Muscle weakness (generalized) (M62.81);Hemiplegia and hemiparesis Hemiplegia - Right/Left: Right Hemiplegia - dominant/non-dominant: Dominant Hemiplegia - caused by: Cerebral infarction                Time: 2330-0762 OT Time Calculation (min): 22 min Charges:  OT General Charges $OT Visit: 1 Visit OT Evaluation $OT Eval Moderate  Complexity: [place]an OTR/L Acute Rehabilitation Services Pager: [phone] Office: [place] 04/19/2018, 4:00 PM

## 2018-04-20 LAB — GLUCOSE, CAPILLARY
Glucose-Capillary: 140 mg/dL — ABNORMAL HIGH (ref 70–99)
Glucose-Capillary: 97 mg/dL (ref 70–99)
Glucose-Capillary: 256 mg/dL — ABNORMAL HIGH (ref 70–99)
Glucose-Capillary: 270 mg/dL — ABNORMAL HIGH (ref 70–99)

## 2018-04-20 MED ORDER — SODIUM CHLORIDE 0.9 % IV SOLN
INTRAVENOUS | Status: DC | PRN
  Administered 2018-04-20: 1000 mL via INTRAVENOUS

## 2018-04-20 MED ORDER — MUPIROCIN CALCIUM 2 % EX CREA
TOPICAL_CREAM | Freq: Two times a day (BID) | CUTANEOUS | Status: DC
  Administered 2018-04-20 – 2018-04-21 (×3): via TOPICAL
  Filled 2018-04-20: qty 15

## 2018-04-20 MED ORDER — METRONIDAZOLE 500 MG PO TABS
500.0000 mg | ORAL_TABLET | Freq: Three times a day (TID) | ORAL | Status: DC
  Administered 2018-04-20 – 2018-04-21 (×2): 500 mg via ORAL
  Filled 2018-04-20 (×2): qty 1

## 2018-04-20 MED ORDER — PRO-STAT SUGAR FREE PO LIQD
30.0000 mL | Freq: Two times a day (BID) | ORAL | Status: DC
  Administered 2018-04-20 – 2018-04-21 (×4): 30 mL via ORAL
  Filled 2018-04-20 (×3): qty 30

## 2018-04-20 NOTE — Progress Notes (Signed)
Inpatient Rehabilitation-Admissions Coordinator   Followed up with pt at the bedside after PM&R consult. Pt was able to communicate some needs while [facility] in the room but due to aphasia, [facility] communicated with pt's son, Ronalee Belts, via phone to gather information. Per son, family will not be able to be with pt 24/7 after DC, which is what would be recommended after a short CIR stay. Pt will need a longer term post acute venue for recovery. AC will communicate recommendations to SW/CM and sign off.   Jhonnie Garner, OTR/L  Rehab Admissions Coordinator  [phone] 04/20/2018 11:57 AM

## 2018-04-20 NOTE — Progress Notes (Signed)
Initial Nutrition Assessment  DOCUMENTATION CODES:   Not applicable  INTERVENTION:  Provide 30 ml Prostat po BID, each supplement provides 100 kcal and 15 grams of protein.   Encourage adequate PO intake.   NUTRITION DIAGNOSIS:   Increased nutrient needs related to wound healing as evidenced by estimated needs.  GOAL:   Patient will meet greater than or equal to 90% of their needs  MONITOR:   PO intake, Supplement acceptance, Labs, Weight trends, I & O's, Skin  REASON FOR ASSESSMENT:   Consult (CVA)  ASSESSMENT:   83 y.o. [place] female with medical history significant of DM; PVD; afib on Elquis; s/p TAVR 11/08/17; HTN; HLD; CVA; diastolic CHF; CAD s/p CABG; and CKD presenting with aphasia  Meal completion has been 100%. Pt reports eating well PTA with no other difficulties. No significant weight loss per weight records. RD to order nutritional supplements to aid in wound healing.   Labs and medications reviewed.    NUTRITION - FOCUSED PHYSICAL EXAM:    Most Recent Value  Orbital Region  No depletion  Upper Arm Region  No depletion  Thoracic and Lumbar Region  No depletion  Buccal Region  No depletion  Temple Region  No depletion  Clavicle Bone Region  No depletion  Clavicle and Acromion Bone Region  No depletion  Scapular Bone Region  No depletion  Dorsal Hand  No depletion  Patellar Region  No depletion  Anterior Thigh Region  No depletion  Posterior Calf Region  No depletion  Edema (RD Assessment)  Mild  Hair  Reviewed  Eyes  Reviewed  Mouth  Reviewed  Skin  Reviewed  Nails  Reviewed       Diet Order:   Diet Order            Diet heart healthy/carb modified Room service appropriate? Yes; Fluid consistency: Thin  Diet effective ____              EDUCATION NEEDS:   Not appropriate for education at this time  Skin:  Skin Assessment: Skin Integrity Issues: Skin Integrity Issues:: Diabetic Ulcer Diabetic Ulcer: R foot  Last BM:   12/20  Height:   Ht Readings from Last 1 Encounters:  04/10/18 5\' 6"  (1.676 m)    Weight:   Wt Readings from Last 1 Encounters:  04/15/18 81.4 kg    Ideal Body Weight:  59 kg  BMI:  There is no height or weight on file to calculate BMI.  Estimated Nutritional Needs:   Kcal:  1600-1800  Protein:  80-95 grams  Fluid:  >/= 1.6 L/day    Corrin Parker, MS, RD, LDN Pager # [phone] After hours/ weekend pager # [phone]

## 2018-04-20 NOTE — NC FL2 (Signed)
[place] LEVEL OF CARE SCREENING TOOL     IDENTIFICATION  Patient Name: Kristina Summers Birthdate: 07-Mar-1936 Sex: female Admission Date (Current Location): 04/18/2018  [facility] and [place] Number:  Herbalist and Address:  [facility] [place], [place] [address] [place], [place] [address]      Provider Number: [id number]  Attending Physician Name and Address:  Hosie Poisson, MD  Relative Name and Phone Number:       Current Level of Care: Hospital Recommended Level of Care: [place] Prior Approval Number:    Date Approved/Denied:   PASRR Number: [id number]  Discharge Plan: SNF    Current Diagnoses: Patient Active Problem List   Diagnosis Date Noted  . Acute ischemic stroke ([place])   . Labile blood glucose   . Benign essential HTN   . Leukocytosis   . CVA (cerebral vascular accident) ([place]) 04/18/2018  . CHF exacerbation ([place]) 04/10/2018  . Acute exacerbation of congestive heart failure ([place]) 03/20/2018  . Gastrointestinal hemorrhage   . Acute on chronic systolic CHF (congestive heart failure) ([place]) 03/13/2018  . Decreased hearing 02/03/2018  . History of myocardial infarction 02/03/2018  . Methicillin resistant Staphylococcus aureus infection 02/03/2018  . Acute blood loss anemia 12/22/2017  . MRSA bacteremia 12/12/2017  . CKD (chronic kidney disease), stage III ([place]) 12/11/2017  . UTI (urinary tract infection) 12/11/2017  . Foot ulcer, right ([place]) 12/11/2017  . 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 Severe aortic stenosis 10/11/2017  . Hypochromic anemia 08/25/2017  . Essential hypertension 06/10/2017  . Type II diabetes mellitus with renal manifestations ([place]) 06/10/2017  . Acute on chronic congestive heart failure ([place]) 01/28/2017  . Fall   . Fracture of distal end of right fibula 05/29/2014  . History of CVA (cerebrovascular accident) 05/24/2014  . Peripheral vascular disease,  unspecified ([place]) 01/11/2014  . CAD (coronary artery disease)   . Paroxysmal atrial fibrillation   . Diabetes mellitus type 2 with peripheral artery disease ([place])   . Hyperlipidemia   . History of GI bleed 04/21/2009  . S/P CABG (coronary artery bypass graft) 11/20/1998    Orientation RESPIRATION BLADDER Height & Weight     Self, Time, Situation, Place  Normal Continent, Incontinent Weight:   Height:     BEHAVIORAL SYMPTOMS/MOOD NEUROLOGICAL BOWEL NUTRITION STATUS      Continent Diet(see DC summary)  AMBULATORY STATUS COMMUNICATION OF NEEDS Skin   Extensive Assist Verbally Other (Comment)(diabetic foot ulcer, bismuth and gauze changed daily)                       Personal Care Assistance Level of Assistance  Bathing, Feeding, Dressing Bathing Assistance: Maximum assistance Feeding assistance: Limited assistance Dressing Assistance: Maximum assistance     Functional Limitations Info  Sight, Hearing, Speech Sight Info: Impaired Hearing Info: Impaired Speech Info: Impaired    SPECIAL CARE FACTORS FREQUENCY  PT (By licensed PT), OT (By licensed OT)     PT Frequency: 5x/wk OT Frequency: 5x/wk            Contractures Contractures Info: Not present    Additional Factors Info  Code Status, Allergies, Insulin Sliding Scale Code Status Info: DNR Allergies Info: Morphine And Related, Lisinopril, Zoloft Sertraline Hcl   Insulin Sliding Scale Info: 0-15 units 3x/day with meals; 0-5 units at bed; 4 units 3x/day with meals; Novolin 12 units before dinner; Novolin 15 units before breakfast  Current Medications (04/20/2018):  This is the current hospital active medication list Current Facility-Administered Medications  Medication Dose Route Frequency Provider Last Rate Last Dose  . 0.9 %  sodium chloride infusion   Intravenous PRN Hosie Poisson, MD 10 mL/hr at 04/20/18 0237 1,000 mL at 04/20/18 0237  . acetaminophen (TYLENOL) tablet 650 mg  650 mg Oral Q4H PRN Karmen Bongo, MD   650 mg at 04/20/18 0215   Or  . acetaminophen (TYLENOL) solution 650 mg  650 mg Per Tube Q4H PRN Karmen Bongo, MD       Or  . acetaminophen (TYLENOL) suppository 650 mg  650 mg Rectal Q4H PRN Karmen Bongo, MD      . apixaban Arne Cleveland) tablet 5 mg  5 mg Oral BID Donzetta Starch, NP   5 mg at 04/20/18 0936  . atorvastatin (LIPITOR) tablet 80 mg  80 mg Oral q1800 Karmen Bongo, MD   80 mg at 04/19/18 1736  . cefTRIAXone (ROCEPHIN) 2 g in sodium chloride 0.9 % 100 mL IVPB  2 g Intravenous Q24H Karmen Bongo, MD   Stopped at 04/19/18 1929  . feeding supplement (PRO-STAT SUGAR FREE 64) liquid 30 mL  30 mL Oral BID Hosie Poisson, MD      . insulin aspart (novoLOG) injection 0-15 Units  0-15 Units Subcutaneous TID WC Karmen Bongo, MD   2 Units at 04/20/18 [phone]  . insulin aspart (novoLOG) injection 0-5 Units  0-5 Units Subcutaneous QHS Karmen Bongo, MD   2 Units at 04/18/18 2127  . insulin aspart (novoLOG) injection 10 Units  10 Units Subcutaneous Once Karmen Bongo, MD      . insulin aspart (novoLOG) injection 4 Units  4 Units Subcutaneous TID WC Karmen Bongo, MD   4 Units at 04/20/18 [phone]  . insulin NPH Human (HUMULIN N,NOVOLIN N) injection 12 Units  12 Units Subcutaneous QAC supper Karmen Bongo, MD   12 Units at 04/19/18 1736  . insulin NPH Human (HUMULIN N,NOVOLIN N) injection 15 Units  15 Units Subcutaneous QAC breakfast Karmen Bongo, MD   15 Units at 04/20/18 [phone]  . metroNIDAZOLE (FLAGYL) tablet 500 mg  500 mg Oral Q8H Hosie Poisson, MD      . mupirocin cream (BACTROBAN) 2 %   Topical BID Hosie Poisson, MD      . pantoprazole (PROTONIX) EC tablet 40 mg  40 mg Oral Daily Karmen Bongo, MD   40 mg at 04/20/18 5035  . predniSONE (DELTASONE) tablet 50 mg  50 mg Oral Q breakfast Rosalin Hawking, MD   50 mg at 04/20/18 4656  . senna (SENOKOT) tablet 8.6 mg  1 tablet Oral QHS PRN Karmen Bongo, MD         Discharge Medications: Please see discharge summary for  a list of discharge medications.  Relevant Imaging Results:  Relevant Lab Results:   Additional Information SS#: [id number]  Geralynn Ochs, LCSW

## 2018-04-20 NOTE — Discharge Instructions (Signed)

## 2018-04-20 NOTE — Progress Notes (Signed)
PROGRESS NOTE    Kristina Summers  YME:[id number] DOB: December 31, 1935 DOA: 04/18/2018 PCP: [facility] [place]    Brief Narrative:  Kristina Summers is a 83 y.o. [place] female with medical history significant of DM; PVD; afib on Elquis; s/p TAVR 11/08/17; HTN; HLD; CVA; diastolic CHF; CAD s/p CABG; and CKD presenting with aphasia  Assessment & Plan:   Principal Problem:   CVA (cerebral vascular accident) ([place]) Active Problems:   Paroxysmal atrial fibrillation   Diabetes mellitus type 2 with peripheral artery disease ([place])   Hyperlipidemia   Essential hypertension   CKD (chronic kidney disease), stage III ([place])   Foot ulcer, right ([place])   Acute ischemic stroke ([place])   Labile blood glucose   Benign essential HTN   Leukocytosis   Left MCA stroke; Admitted for further evaluation of stroke.  Neurology consulted and recommendations given.  MRi brain reviewed. Hgba1c is 7.2 at goal. LDL is 89, continue with lipitor 80 mg daily.  Restarted eliquis at 5 mg daily and d/c aspIrin.  Therapy evals recommending SNF at this time.    Hypertension:  Better controlled today.  Restart her home bp meds on discharge.   Type 2 DM: CBG (last 3)  Recent Labs    04/19/18 2118 04/20/18 0627 04/20/18 1108  GLUCAP 132* 140* 97  .  Resume SSI. No change in medications.   Chronic atrial fibrillation:  Rate controlled.  Restarted eliquis.   Chronic right foot infection:  On rocephin and flagyl . Will need outpatient podiatry follow up on discharge.    Temporal arteritis: Since she had much improvement in her headache with improvement in ESR and crp, will continue with prednisone 50 mg daily.    DVT prophylaxis:  SCD'S Code Status: DNR.  Family Communication:  None at bedside.  Disposition Plan:SNF when bed available.    Consultants:   Neurology.    Procedures: MRI brain without contrast.   Antimicrobials: none.   Subjective: Aphasic, not in distress. Eating by herself.    Objective: Vitals:   04/19/18 1957 04/19/18 2326 04/20/18 0330 04/20/18 0745  BP: (!) 156/59 (!) 167/67 (!) 162/61 (!) 159/82  Pulse: 77 83 87 93  Resp: '20 20 20 20  '$ Temp: 98.5 F (36.9 C) 98.5 F (36.9 C) 99.1 F (37.3 C) 97.9 F (36.6 C)  TempSrc: Oral Oral Oral Oral  SpO2: 97% 95% 93% 96%    Intake/Output Summary (Last 24 hours) at 04/20/2018 1200 Last data filed at 04/19/2018 1942 Gross per 24 hour  Intake 400 ml  Output -  Net 400 ml   There were no vitals filed for this visit.  Examination:  General exam: Appears calm and comfortable , not in distress,  Respiratory system: Clear to auscultation. Respiratory effort normal. No wheezing or rhonchi.  Cardiovascular system: S1 & S2 heard, irregular. No JVD.  Gastrointestinal system: Abdomen is soft NT ND BS+  Normal bowel sounds heard. Central nervous system: Alert , comfortable, but aphasic. Right hemiparesis.  Extremities: no pedal edema.  Skin: No rashes, lesions or ulcers Psychiatry: Mood & affect appropriate.     Data Reviewed: I have personally reviewed following labs and imaging studies  CBC: Recent Labs  Lab 04/14/18 0850 04/15/18 0405 04/18/18 0747 04/18/18 0802 04/19/18 0729  WBC 10.1 9.2 15.9*  --  13.5*  NEUTROABS 9.6* 8.0* 14.0*  --   --   HGB 9.2* 8.6* 8.7* 9.9* 8.8*  HCT 30.5* 27.5* 28.4* 29.0* 27.7*  MCV 87.6 86.8 85.8  --  86.8  PLT 230 211 163  --  397   Basic Metabolic Panel: Recent Labs  Lab 04/14/18 0850 04/15/18 0405 04/18/18 0747 04/18/18 0802 04/19/18 0729  NA 134* 136 132* 133* 138  K 4.3 4.7 4.1 4.0 4.0  CL 105 106 102 102 110  CO2 22 19* 20*  --  21*  GLUCOSE 256* 109* 397* 413* 92  BUN 71* 73* 62* 56* 49*  CREATININE 1.91* 1.68* 1.64* 1.50* 1.36*  CALCIUM 8.9 8.7* 8.6*  --  8.1*   GFR: Estimated Creatinine Clearance: 35.5 mL/min (A) (by C-G formula based on SCr of 1.36 mg/dL (H)). Liver Function Tests: Recent Labs  Lab 04/18/18 0747  AST 43*  ALT 41  ALKPHOS  104  BILITOT 0.9  PROT 7.3  ALBUMIN 2.1*   No results for input(s): LIPASE, AMYLASE in the last 168 hours. No results for input(s): AMMONIA in the last 168 hours. Coagulation Profile: Recent Labs  Lab 04/18/18 0747  INR 1.14   Cardiac Enzymes: No results for input(s): CKTOTAL, CKMB, CKMBINDEX, TROPONINI in the last 168 hours. BNP (last 3 results) No results for input(s): PROBNP in the last 8760 hours. HbA1C: No results for input(s): HGBA1C in the last 72 hours. CBG: Recent Labs  Lab 04/19/18 1136 04/19/18 1619 04/19/18 2118 04/20/18 0627 04/20/18 1108  GLUCAP 192* 154* 132* 140* 97   Lipid Profile: Recent Labs    04/19/18 0427  CHOL 150  HDL 45  LDLCALC 89  TRIG 80  CHOLHDL 3.3   Thyroid Function Tests: No results for input(s): TSH, T4TOTAL, FREET4, T3FREE, THYROIDAB in the last 72 hours. Anemia Panel: No results for input(s): VITAMINB12, FOLATE, FERRITIN, TIBC, IRON, RETICCTPCT in the last 72 hours. Sepsis Labs: No results for input(s): PROCALCITON, LATICACIDVEN in the last 168 hours.  Recent Results (from the past 240 hour(s))  Surgical pcr screen     Status: Abnormal   Collection Time: 04/13/18 12:34 AM  Result Value Ref Range Status   MRSA, PCR POSITIVE (A) NEGATIVE Final    Comment: RESULT CALLED TO, READ BACK BY AND VERIFIED WITH: H HAMLIN RN 04/13/18 0318 JDW    Staphylococcus aureus POSITIVE (A) NEGATIVE Final    Comment: (NOTE) The Xpert SA Assay (FDA approved for NASAL specimens in patients 81 years of age and older), is one component of a comprehensive surveillance program. It is not intended to diagnose infection nor to guide or monitor treatment. Performed at [facility], [place] [address], [place], [place] [address]   Blood Cultures x 2 sites     Status: None (Preliminary result)   Collection Time: 04/18/18  4:40 PM  Result Value Ref Range Status   Specimen Description BLOOD RIGHT ARM  Final   Special Requests   Final    BOTTLES  DRAWN AEROBIC ONLY Blood Culture adequate volume   Culture   Final    NO GROWTH < 24 HOURS Performed at [facility], [place] [address], [place], [place] [address]    Report Status PENDING  Incomplete  Blood Cultures x 2 sites     Status: None (Preliminary result)   Collection Time: 04/18/18  5:05 PM  Result Value Ref Range Status   Specimen Description BLOOD LEFT ANTECUBITAL  Final   Special Requests   Final    BOTTLES DRAWN AEROBIC ONLY Blood Culture adequate volume   Culture   Final    NO GROWTH < 24 HOURS Performed at [facility], [address] [place], [place] [address]    Report Status PENDING  Incomplete         Radiology Studies: Mr Brain Wo Contrast  Result Date: 04/18/2018 CLINICAL DATA:  83 year old female code stroke. Left MCA M3 branch occlusion on CTA / CTP. earlier today. Atrial fibrillation on Eliquis, not a candidate for IV tPA. EXAM: MRI HEAD WITHOUT CONTRAST TECHNIQUE: Multiplanar, multiecho pulse sequences of the brain and surrounding structures were obtained without intravenous contrast. COMPARISON:  CTA CT perfusion 0802 hours today. Brain MRI 05/30/2014. FINDINGS: Brain: Scattered small cortical infarcts in the left MCA territory primarily along the left middle frontal gyrus along the posterior and superior margins of chronic encephalomalacia which is related to the 2016 infarct. There also is a small area of cortical infarction along the posterior left insula (series 3, image 24) and left inferior frontal operculum (series 3, image 26). Faint T2 and FLAIR hyperintensity in the affected parenchyma with no hemorrhage or mass effect. Superimposed small area of left parietal lobe and medial motor strip chronic cortical encephalomalacia on series 6, image 19. No contralateral right hemisphere or posterior circulation restricted diffusion. Nomidline shift, mass effect, evidence of mass lesion, ventriculomegaly, extra-axial collection or acute intracranial  hemorrhage. Cervicomedullary junction and pituitary are within normal limits. Elsewhere in the brain gray and white matter signal has not significantly changed since 2016. The deep gray matter nuclei, brainstem, and cerebellum remain normal for age. No chronic cerebral blood products identified. Vascular: Major intracranial vascular flow voids are stable since 2016. Skull and upper cervical spine: Negative for age visible cervical spine. Normal bone marrow signal. Sinuses/Orbits: Stable and negative. Other: Mastoids remain clear. Visible internal auditory structures appear normal. Scalp and face soft tissues are within normal limits. IMPRESSION: 1. Multiple small cortical infarcts scattered in the Left MCA territory from the posterior insula to the left middle frontal gyrus are compatible with the sequelae of the Left M3 branch occlusion demonstrated earlier today. 2. No associated hemorrhage or mass effect. No other acute vascular territory ischemia. 3. Superimposed chronic encephalomalacia in the left MCA territory. Electronically Signed   By: Genevie Ann M.D.   On: 04/18/2018 13:57   Vas US Carotid (at [place] Only)  Result Date: 04/18/2018 Carotid Arterial Duplex Study Indications: CVA. Limitations: difficulty visualizing anatomy well due to tortuousity Performing Technologist: Abram Sander RVS  Examination Guidelines: A complete evaluation includes B-mode imaging, spectral Doppler, color Doppler, and power Doppler as needed of all accessible portions of each vessel. Bilateral testing is considered an integral part of a complete examination. Limited examinations for reoccurring indications may be performed as noted.  Right Carotid Findings: +----------+--------+--------+--------+------------+--------+           PSV cm/sEDV cm/sStenosisDescribe    Comments +----------+--------+--------+--------+------------+--------+ CCA Prox  62                      heterogenous          +----------+--------+--------+--------+------------+--------+ CCA Distal43                      heterogenous         +----------+--------+--------+--------+------------+--------+ ICA Prox  82      10      1-39%   heterogenous         +----------+--------+--------+--------+------------+--------+ ICA Distal79      8                                    +----------+--------+--------+--------+------------+--------+  ECA       146                                          +----------+--------+--------+--------+------------+--------+ +----------+--------+-------+--------+-------------------+           PSV cm/sEDV cmsDescribeArm Pressure (mmHG) +----------+--------+-------+--------+-------------------+ Subclavian118                                        +----------+--------+-------+--------+-------------------+ +---------+--------+--+--------+-+---------+ VertebralPSV cm/s42EDV cm/s5Antegrade +---------+--------+--+--------+-+---------+  Left Carotid Findings: +----------+--------+--------+--------+------------+--------+           PSV cm/sEDV cm/sStenosisDescribe    Comments +----------+--------+--------+--------+------------+--------+ CCA Prox  91      7               heterogenous         +----------+--------+--------+--------+------------+--------+ CCA Distal70      8               heterogenous         +----------+--------+--------+--------+------------+--------+ ICA Prox  94      12      1-39%   heterogenous         +----------+--------+--------+--------+------------+--------+ ICA Distal82      12                                   +----------+--------+--------+--------+------------+--------+ ECA       75      6                                    +----------+--------+--------+--------+------------+--------+ +----------+--------+--------+--------+-------------------+ SubclavianPSV cm/sEDV cm/sDescribeArm Pressure (mmHG)  +----------+--------+--------+--------+-------------------+           91                                          +----------+--------+--------+--------+-------------------+ +---------+--------+--+--------+--+---------+ VertebralPSV cm/s93EDV cm/s12Antegrade +---------+--------+--+--------+--+---------+  Summary: Right Carotid: Velocities in the right ICA are consistent with a 1-39% stenosis. Left Carotid: Velocities in the left ICA are consistent with a 1-39% stenosis. Vertebrals: Bilateral vertebral arteries demonstrate antegrade flow. *See table(s) above for measurements and observations.  Electronically signed by Servando Snare MD on 04/18/2018 at 5:10:01 PM.    Final         Scheduled Meds: . apixaban  5 mg Oral BID  . atorvastatin  80 mg Oral q1800  . insulin aspart  0-15 Units Subcutaneous TID WC  . insulin aspart  0-5 Units Subcutaneous QHS  . insulin aspart  10 Units Subcutaneous Once  . insulin aspart  4 Units Subcutaneous TID WC  . insulin NPH Human  12 Units Subcutaneous QAC supper  . insulin NPH Human  15 Units Subcutaneous QAC breakfast  . pantoprazole  40 mg Oral Daily  . predniSONE  50 mg Oral Q breakfast   Continuous Infusions: . sodium chloride 1,000 mL (04/20/18 0237)  . cefTRIAXone (ROCEPHIN)  IV Stopped (04/19/18 1929)   And  . metronidazole 500 mg (04/20/18 0936)     LOS: 2 days    Time spent: 30 minutes.     Hosie Poisson, MD  [facility] Pager [phone]  If 7PM-7AM, please contact night-coverage [email or web address] Password [email or web address] 04/20/2018, 12:00 PM

## 2018-04-20 NOTE — Consult Note (Signed)
[place] Nurse wound consult note Reason for Consult:right lateral/plantar surface full thickness foot wound Wound type:vascular insufficiency Pressure Injury POA: NA Measurement:1cm x 1.5cm x 0.2cm Wound IRC:VELFYB pale pink bed Drainage (amount, consistency, odor) scant, no odor noted Periwound:calloused Dressing procedure/placement/frequency: I have provided nurses with orders for cleansing wound and apply Mupirocin ointment daily. We will not follow, but will remain available to this patient, to nursing, and the medical and/or surgical teams. Please re-consult if we need to assist further.  Fara Olden, RN-C, WTA-C, [place] Wound Treatment Associate Ostomy Care Associate

## 2018-04-21 ENCOUNTER — Ambulatory Visit (HOSPITAL_COMMUNITY): Payer: Medicare Other

## 2018-04-21 DIAGNOSIS — I4821 Permanent atrial fibrillation: Secondary | ICD-10-CM

## 2018-04-21 DIAGNOSIS — L97519 Non-pressure chronic ulcer of other part of right foot with unspecified severity: Secondary | ICD-10-CM

## 2018-04-21 LAB — GLUCOSE, CAPILLARY
GLUCOSE-CAPILLARY: 379 mg/dL — AB (ref 70–99)
Glucose-Capillary: 336 mg/dL — ABNORMAL HIGH (ref 70–99)

## 2018-04-21 MED ORDER — DOXYCYCLINE HYCLATE 100 MG PO TABS: 100.0000 mg | ORAL_TABLET | Freq: Every day | ORAL | 0 refills | Status: AC

## 2018-04-21 MED ORDER — APIXABAN 5 MG PO TABS: 5.0000 mg | ORAL_TABLET | Freq: Two times a day (BID) | ORAL | 1 refills | Status: DC

## 2018-04-21 MED ORDER — INSULIN ASPART 100 UNIT/ML ~~LOC~~ SOLN: SUBCUTANEOUS | 11 refills | Status: DC

## 2018-04-21 MED ORDER — PREDNISONE 50 MG PO TABS: 50.0000 mg | ORAL_TABLET | Freq: Every day | ORAL | 0 refills | Status: DC

## 2018-04-21 MED ORDER — PRO-STAT SUGAR FREE PO LIQD: 30.0000 mL | Freq: Two times a day (BID) | ORAL | 0 refills | Status: DC

## 2018-04-21 MED ORDER — MUPIROCIN CALCIUM 2 % EX CREA: TOPICAL_CREAM | Freq: Two times a day (BID) | CUTANEOUS | 0 refills | Status: DC

## 2018-04-21 NOTE — Progress Notes (Signed)
IV removed without complication. Report called to [facility] place RN Diane. Awaiting [facility] for transport.

## 2018-04-21 NOTE — Discharge Summary (Signed)
Physician Discharge Summary  Kristina Summers XIP:[id number] DOB: March 16, 1936 DOA: 04/18/2018  PCP: [facility], [place] date: 04/18/2018 Discharge date: 04/21/2018  Admitted From: SNF Disposition: SNf  Recommendations for Outpatient Follow-up:  1. Follow up with PCP in 1-2 weeks 2. Please obtain BMP/CBC in one week Please follow up with neurology as recommended.  please follow up with wound care center for the chronic foot ulcer.   Discharge Condition:stable.  CODE STATUS: DNR Diet recommendation: Heart HealthY   Brief/Interim Summary:  Kristina [name] a 83 y.o.[place] medical history significant ofDM; PVD; afib on Elquis; s/p TAVR 11/08/17; HTN; HLD; CVA; diastolic CHF; CAD s/p CABG; and CKD presenting withaphasia. She was found to have Mult small scattered left MCA infarcts c/w L M2/M3 occlusion, infarcts embolic secondary to known atrial fibrillation.    Discharge Diagnoses:  Principal Problem:   CVA (cerebral vascular accident) ([place]) Active Problems:   Paroxysmal atrial fibrillation   Diabetes mellitus type 2 with peripheral artery disease ([place])   Hyperlipidemia   Essential hypertension   CKD (chronic kidney disease), stage III ([place])   Foot ulcer, right ([place])   Acute ischemic stroke ([place])   Labile blood glucose   Benign essential HTN   Leukocytosis  Mult small scattered left MCA infarcts c/w L M2/M3 occlusion, infarcts embolic secondary to known atrial fibrillation   CTA head & neck no LVO. L M3 branch occlusion, extensive large vessel disease (mod B ICA siphons, up to 50% mult prox great vessels). Aortic atherosclerosis.  CT perfusion 18mL penumbra L MCA territory without core infarct Carotid duplex does not show significant stenosis.  Echocardiogram does not show any embolus.  She was started on Eliquis.  Therapy evals recommending SNF.  hgba1c is 7.2 LDL is 89.    Atrial fibrillation s/p TAVR On eliquis and metoprolol. Resume the same.     Hypertension; Resume lasix and amlodipine on discharge.    Hyperlipidemia: resume lipitor.    DM; Resume home meds.    Temporal arteritis: Resume prednisone till she sees Kristina Summers .    Chronic right foot ulcer; Complete the course of doxycycline and recommend follow up with wound care center on discharge.    Stage 3 CKD Creatinine is at baseline.     Discharge Instructions  Discharge Instructions    Ambulatory referral to Neurology   Complete by:  As directed    Follow up with stroke clinic - sees Kristina Summers alreay for temporal arteritis - recommend f/u with her at [facility] in about 4 weeks.   Diet - low sodium heart healthy   Complete by:  As directed    Discharge instructions   Complete by:  As directed    Follow up with Neurology in 3 to 4 weeks as recommended. Will need outpatient wound care for the chronic foot ulcer, possible referral to wound care center.  Follow up with PCP in one week.     Allergies as of 04/21/2018      Reactions   Morphine And Related Nausea And Vomiting, Other (See Comments)   Chest pain, also   Lisinopril Cough   Zoloft [sertraline Hcl] Nausea Only         Medication List    TAKE these medications   acetaminophen 500 MG tablet Commonly known as:  TYLENOL Take 500 mg by mouth daily as needed for moderate pain or headache.   amLODipine 10 MG tablet Commonly known as:  NORVASC Take 10 mg by mouth daily.  apixaban 5 MG Tabs tablet Commonly known as:  ELIQUIS Take 1 tablet (5 mg total) by mouth 2 (two) times daily. What changed:    medication strength  how much to take   atorvastatin 80 MG tablet Commonly known as:  LIPITOR Take 1 tablet (80 mg total) by mouth daily. What changed:    how much to take  when to take this   doxycycline 100 MG tablet Commonly known as:  VIBRA-TABS Take 1 tablet (100 mg total) by mouth daily for 4 days.   feeding supplement (PRO-STAT SUGAR FREE 64)  Liqd Take 30 mLs by mouth 2 (two) times daily.   furosemide 40 MG tablet Commonly known as:  LASIX Take 1 tablet (40 mg total) by mouth daily. Start tomorrow 12/6   glucose 4 GM chewable tablet Chew 1-4 tablets by mouth once as needed for low blood sugar.   insulin aspart 100 UNIT/ML injection Commonly known as:  novoLOG CBG 70 - 120: 0 units CBG 121 - 150: 2 units CBG 151 - 200: 3 units CBG 201 - 250: 5 units CBG 251 - 300: 8 units CBG 301 - 350: 11 units CBG 351 - 400: 15 units What changed:    how much to take  how to take this  when to take this  additional instructions   insulin NPH Human 100 UNIT/ML injection Commonly known as:  HUMULIN N,NOVOLIN N Inject 0.12 mLs (12 Units total) into the skin daily before supper. What changed:  when to take this   insulin NPH Human 100 UNIT/ML injection Commonly known as:  HUMULIN N,NOVOLIN N Inject 0.18 mLs (18 Units total) into the skin daily before breakfast. What changed:  when to take this   metoprolol tartrate 25 MG tablet Commonly known as:  LOPRESSOR Take 1 tablet (25 mg total) by mouth 2 (two) times daily.   multivitamin with minerals Tabs tablet Take 1 tablet by mouth daily. One a Day 50 plus   mupirocin cream 2 % Commonly known as:  BACTROBAN Apply topically 2 (two) times daily.   nitroGLYCERIN 0.4 MG SL tablet Commonly known as:  NITROSTAT Place 0.4 mg under the tongue every 5 (five) minutes as needed for chest pain.   nutrition supplement (JUVEN) Pack Take 1 packet by mouth 2 (two) times daily between meals.   pantoprazole 40 MG tablet Commonly known as:  PROTONIX Take 1 tablet (40 mg total) by mouth daily for 30 doses.   predniSONE 50 MG tablet Commonly known as:  DELTASONE Take 1 tablet (50 mg total) by mouth daily with breakfast. What changed:    how much to take  how to take this  when to take this  additional instructions   senna 8.6 MG Tabs tablet Commonly known as:  SENOKOT Take 1  tablet by mouth as needed for mild constipation.       Contact information for follow-up providers    Kristina Beam, MD Follow up in 4 week(s).   Specialty:  Neurology Why:  stroke clinic. office will call with appt date and time.  Contact information: [address] [place] [address] [phone]        [facility]. Schedule an appointment as soon as possible for a visit in 1 week(s).   Specialty:  Home Health Services Contact information: [place] [address] [phone]            Contact information for after-discharge care  Destination    [facility] .   Service:  Skilled Nursing Contact information: [place] [address] [phone]                 Allergies  Allergen Reactions  . Morphine And Related Nausea And Vomiting and Other (See Comments)    Chest pain, also   . Lisinopril Cough  . Zoloft [Sertraline Hcl] Nausea Only         Consultations:  Neurology  Wound care     Procedures/Studies: Ct Angio Head W Or Wo Contrast  Result Date: 04/18/2018 CLINICAL DATA:  83 year old female code stroke, right facial droop. EXAM: CT ANGIOGRAPHY HEAD AND NECK CT PERFUSION BRAIN TECHNIQUE: Multidetector CT imaging of the head and neck was performed using the standard protocol during bolus administration of intravenous contrast. Multiplanar CT image reconstructions and MIPs were obtained to evaluate the vascular anatomy. Carotid stenosis measurements (when applicable) are obtained utilizing NASCET criteria, using the distal internal carotid diameter as the denominator. Multiphase CT imaging of the brain was performed following IV bolus contrast injection. Subsequent parametric perfusion maps were calculated using RAPID software. CONTRAST:  149mL ISOVUE-370 IOPAMIDOL (ISOVUE-370) INJECTION 76% COMPARISON:  CT head without contrast 0753 hours today. Prior CTA head and neck  05/30/2014. FINDINGS: CT Brain Perfusion Findings: CBF (<30%) Volume: 51mL Perfusion (Tmax>6.0s) volume: 88mL Mismatch Volume: 42mL Infarction Location:Penumbra location is in the posterior left MCA territory CTA NECK Skeleton: Absent dentition. Prior sternotomy. Osteopenia. Cervical spine degeneration. No acute osseous abnormality identified. Upper chest: Chronic subpleural reticular opacity +/-paraseptal emphysema in both upper lungs. Superior mediastinal lymph nodes are within normal limits. Other neck: Stable and negative; atrophied or absent left submandibular gland as before. Aortic arch: Prior CABG. Extensive Calcified aortic atherosclerosis. 3 vessel arch configuration. Right carotid system: Bulky calcified plaque at the brachiocephalic origin and proximal segment with up to 50% stenosis. Mild plaque at the right CCA origin without stenosis. Tortuous proximal right CCA. Calcified plaque at the right carotid bifurcation and proximal right ICA. No significant cervical right ICA stenosis. Mild tortuosity. Left carotid system: Calcified plaque at the left CCA origin does not appear significant. Intermittent calcified plaque proximal to the bifurcation without stenosis. Bulky calcified plaque at the left ICA origin is stable since 2016 with less than 50 % stenosis with respect to the distal vessel. Tortuous cervical left ICA. Vertebral arteries: Proximal right subclavian artery plaque but no significant stenosis. The right vertebral origin is spared. The right vertebral artery is tortuous throughout the neck but patent to the skull base without stenosis. Bulky calcified plaque in the proximal left subclavian artery. Subsequent stenosis is roughly 50%. The left vertebral artery origin is largely spared. The left vertebral is mildly dominant and patent to the skull base without stenosis. CTA HEAD Posterior circulation: Bilateral V4 segment calcified plaque without significant stenosis. The left V4 segment is  dominant. Normal bilateral PICA origins. Patent vertebrobasilar junction and basilar artery without stenosis. Normal SCA and PCA origins. Posterior communicating arteries are diminutive or absent. Bilateral PCA branches are stable and within normal limits. Anterior circulation: Both ICA siphons are patent but heavily calcified. On the right there is up to moderate stenosis in the supraclinoid segment. On the left there is moderate cavernous and supraclinoid stenosis. Patent carotid termini. Normal MCA and ACA origins. Anterior communicating artery and bilateral ACA branches are within normal limits aside from calcified atherosclerosis at the distal left ACA A2 or a 3 branch point (  series 12, image 26), new since 2016. Right MCA M1 segment, bifurcation, and right MCA branches are stable and within normal limits. The left MCA M1 and left MCA trifurcation are patent without stenosis. No M2 occlusion is identified. But a posterior M3 branch of the middle M2 division is occluded as seen on series 12, image 35 and was patent on the 2016 CTA. Venous sinuses: Patent. Anatomic variants: Dominant left vertebral artery. Review of the MIP images confirms the above findings IMPRESSION: 1. Negative for LVO but positive for Left MCA M3 branch occlusion, and CTP detects 12 mL of ischemic penumbra in the corresponding posterior left MCA territory. No infarct core detected. This was reviewed by telephone with Kristina. Roland Rack on 04/18/2018 at 0820 hours. 2. Extensive chronic large vessel calcified atherosclerosis in the head and neck, and Aortic atherosclerosis (ICD10-I70.0). - moderate stenoses of the bilateral ICA siphons. - no hemodynamically significant cervical ICA or vertebral artery stenosis. - up to 50% stenosis of multiple proximal great vessels. Electronically Signed   By: Genevie Ann M.D.   On: 04/18/2018 08:36   Dg Chest 2 View  Result Date: 04/10/2018 CLINICAL DATA:  Acute shortness of breath and chest pain EXAM:  CHEST - 2 VIEW COMPARISON:  03/20/2018 FINDINGS: Artifact overlies the chest. Previous median sternotomy and CABG. Previous aortic valve replacement. Cardiomegaly. Pulmonary venous hypertension with interstitial edema. Small effusions in the posterior costophrenic angles. Mild dependent pulmonary atelectasis. IMPRESSION: Congestive heart failure with interstitial edema and small effusions. Electronically Signed   By: Nelson Chimes M.D.   On: 04/10/2018 12:29   Ct Head Wo Contrast  Result Date: 04/10/2018 CLINICAL DATA:  Severe headache. EXAM: CT HEAD WITHOUT CONTRAST TECHNIQUE: Contiguous axial images were obtained from the base of the skull through the vertex without intravenous contrast. COMPARISON:  CT head dated December 11, 2017. FINDINGS: Brain: No evidence of acute infarction, hemorrhage, hydrocephalus, extra-axial collection or mass lesion/mass effect. Unchanged left frontal encephalomalacia. Stable mild atrophy and chronic microvascular ischemic changes. Vascular: Calcified atherosclerosis at the skullbase. No hyperdense vessel. Skull: Negative for fracture or focal abnormality. Sinuses/Orbits: No acute finding. Unchanged partial opacification of the left mastoid air cells. Other: None. IMPRESSION: 1.  No acute intracranial abnormality. 2. Stable mild atrophy and chronic microvascular ischemic changes. Unchanged old left frontal infarct. Electronically Signed   By: Titus Dubin M.D.   On: 04/10/2018 14:59   Ct Angio Neck W Or Wo Contrast  Result Date: 04/18/2018 CLINICAL DATA:  83 year old female code stroke, right facial droop. EXAM: CT ANGIOGRAPHY HEAD AND NECK CT PERFUSION BRAIN TECHNIQUE: Multidetector CT imaging of the head and neck was performed using the standard protocol during bolus administration of intravenous contrast. Multiplanar CT image reconstructions and MIPs were obtained to evaluate the vascular anatomy. Carotid stenosis measurements (when applicable) are obtained utilizing  NASCET criteria, using the distal internal carotid diameter as the denominator. Multiphase CT imaging of the brain was performed following IV bolus contrast injection. Subsequent parametric perfusion maps were calculated using RAPID software. CONTRAST:  145mL ISOVUE-370 IOPAMIDOL (ISOVUE-370) INJECTION 76% COMPARISON:  CT head without contrast 0753 hours today. Prior CTA head and neck 05/30/2014. FINDINGS: CT Brain Perfusion Findings: CBF (<30%) Volume: 54mL Perfusion (Tmax>6.0s) volume: 49mL Mismatch Volume: 73mL Infarction Location:Penumbra location is in the posterior left MCA territory CTA NECK Skeleton: Absent dentition. Prior sternotomy. Osteopenia. Cervical spine degeneration. No acute osseous abnormality identified. Upper chest: Chronic subpleural reticular opacity +/-paraseptal emphysema in both upper lungs. Superior mediastinal lymph nodes  are within normal limits. Other neck: Stable and negative; atrophied or absent left submandibular gland as before. Aortic arch: Prior CABG. Extensive Calcified aortic atherosclerosis. 3 vessel arch configuration. Right carotid system: Bulky calcified plaque at the brachiocephalic origin and proximal segment with up to 50% stenosis. Mild plaque at the right CCA origin without stenosis. Tortuous proximal right CCA. Calcified plaque at the right carotid bifurcation and proximal right ICA. No significant cervical right ICA stenosis. Mild tortuosity. Left carotid system: Calcified plaque at the left CCA origin does not appear significant. Intermittent calcified plaque proximal to the bifurcation without stenosis. Bulky calcified plaque at the left ICA origin is stable since 2016 with less than 50 % stenosis with respect to the distal vessel. Tortuous cervical left ICA. Vertebral arteries: Proximal right subclavian artery plaque but no significant stenosis. The right vertebral origin is spared. The right vertebral artery is tortuous throughout the neck but patent to the skull  base without stenosis. Bulky calcified plaque in the proximal left subclavian artery. Subsequent stenosis is roughly 50%. The left vertebral artery origin is largely spared. The left vertebral is mildly dominant and patent to the skull base without stenosis. CTA HEAD Posterior circulation: Bilateral V4 segment calcified plaque without significant stenosis. The left V4 segment is dominant. Normal bilateral PICA origins. Patent vertebrobasilar junction and basilar artery without stenosis. Normal SCA and PCA origins. Posterior communicating arteries are diminutive or absent. Bilateral PCA branches are stable and within normal limits. Anterior circulation: Both ICA siphons are patent but heavily calcified. On the right there is up to moderate stenosis in the supraclinoid segment. On the left there is moderate cavernous and supraclinoid stenosis. Patent carotid termini. Normal MCA and ACA origins. Anterior communicating artery and bilateral ACA branches are within normal limits aside from calcified atherosclerosis at the distal left ACA A2 or a 3 branch point (series 12, image 26), new since 2016. Right MCA M1 segment, bifurcation, and right MCA branches are stable and within normal limits. The left MCA M1 and left MCA trifurcation are patent without stenosis. No M2 occlusion is identified. But a posterior M3 branch of the middle M2 division is occluded as seen on series 12, image 35 and was patent on the 2016 CTA. Venous sinuses: Patent. Anatomic variants: Dominant left vertebral artery. Review of the MIP images confirms the above findings IMPRESSION: 1. Negative for LVO but positive for Left MCA M3 branch occlusion, and CTP detects 12 mL of ischemic penumbra in the corresponding posterior left MCA territory. No infarct core detected. This was reviewed by telephone with Kristina. Roland Rack on 04/18/2018 at 0820 hours. 2. Extensive chronic large vessel calcified atherosclerosis in the head and neck, and Aortic  atherosclerosis (ICD10-I70.0). - moderate stenoses of the bilateral ICA siphons. - no hemodynamically significant cervical ICA or vertebral artery stenosis. - up to 50% stenosis of multiple proximal great vessels. Electronically Signed   By: Genevie Ann M.D.   On: 04/18/2018 08:36   Mr Brain Wo Contrast  Result Date: 04/18/2018 CLINICAL DATA:  83 year old female code stroke. Left MCA M3 branch occlusion on CTA / CTP. earlier today. Atrial fibrillation on Eliquis, not a candidate for IV tPA. EXAM: MRI HEAD WITHOUT CONTRAST TECHNIQUE: Multiplanar, multiecho pulse sequences of the brain and surrounding structures were obtained without intravenous contrast. COMPARISON:  CTA CT perfusion 0802 hours today. Brain MRI 05/30/2014. FINDINGS: Brain: Scattered small cortical infarcts in the left MCA territory primarily along the left middle frontal gyrus along the posterior and superior  margins of chronic encephalomalacia which is related to the 2016 infarct. There also is a small area of cortical infarction along the posterior left insula (series 3, image 24) and left inferior frontal operculum (series 3, image 26). Faint T2 and FLAIR hyperintensity in the affected parenchyma with no hemorrhage or mass effect. Superimposed small area of left parietal lobe and medial motor strip chronic cortical encephalomalacia on series 6, image 19. No contralateral right hemisphere or posterior circulation restricted diffusion. Nomidline shift, mass effect, evidence of mass lesion, ventriculomegaly, extra-axial collection or acute intracranial hemorrhage. Cervicomedullary junction and pituitary are within normal limits. Elsewhere in the brain gray and white matter signal has not significantly changed since 2016. The deep gray matter nuclei, brainstem, and cerebellum remain normal for age. No chronic cerebral blood products identified. Vascular: Major intracranial vascular flow voids are stable since 2016. Skull and upper cervical spine:  Negative for age visible cervical spine. Normal bone marrow signal. Sinuses/Orbits: Stable and negative. Other: Mastoids remain clear. Visible internal auditory structures appear normal. Scalp and face soft tissues are within normal limits. IMPRESSION: 1. Multiple small cortical infarcts scattered in the Left MCA territory from the posterior insula to the left middle frontal gyrus are compatible with the sequelae of the Left M3 branch occlusion demonstrated earlier today. 2. No associated hemorrhage or mass effect. No other acute vascular territory ischemia. 3. Superimposed chronic encephalomalacia in the left MCA territory. Electronically Signed   By: Genevie Ann M.D.   On: 04/18/2018 13:57   Ct Cerebral Perfusion W Contrast  Result Date: 04/18/2018 CLINICAL DATA:  83 year old female code stroke, right facial droop. EXAM: CT ANGIOGRAPHY HEAD AND NECK CT PERFUSION BRAIN TECHNIQUE: Multidetector CT imaging of the head and neck was performed using the standard protocol during bolus administration of intravenous contrast. Multiplanar CT image reconstructions and MIPs were obtained to evaluate the vascular anatomy. Carotid stenosis measurements (when applicable) are obtained utilizing NASCET criteria, using the distal internal carotid diameter as the denominator. Multiphase CT imaging of the brain was performed following IV bolus contrast injection. Subsequent parametric perfusion maps were calculated using RAPID software. CONTRAST:  176mL ISOVUE-370 IOPAMIDOL (ISOVUE-370) INJECTION 76% COMPARISON:  CT head without contrast 0753 hours today. Prior CTA head and neck 05/30/2014. FINDINGS: CT Brain Perfusion Findings: CBF (<30%) Volume: 85mL Perfusion (Tmax>6.0s) volume: 4mL Mismatch Volume: 29mL Infarction Location:Penumbra location is in the posterior left MCA territory CTA NECK Skeleton: Absent dentition. Prior sternotomy. Osteopenia. Cervical spine degeneration. No acute osseous abnormality identified. Upper chest:  Chronic subpleural reticular opacity +/-paraseptal emphysema in both upper lungs. Superior mediastinal lymph nodes are within normal limits. Other neck: Stable and negative; atrophied or absent left submandibular gland as before. Aortic arch: Prior CABG. Extensive Calcified aortic atherosclerosis. 3 vessel arch configuration. Right carotid system: Bulky calcified plaque at the brachiocephalic origin and proximal segment with up to 50% stenosis. Mild plaque at the right CCA origin without stenosis. Tortuous proximal right CCA. Calcified plaque at the right carotid bifurcation and proximal right ICA. No significant cervical right ICA stenosis. Mild tortuosity. Left carotid system: Calcified plaque at the left CCA origin does not appear significant. Intermittent calcified plaque proximal to the bifurcation without stenosis. Bulky calcified plaque at the left ICA origin is stable since 2016 with less than 50 % stenosis with respect to the distal vessel. Tortuous cervical left ICA. Vertebral arteries: Proximal right subclavian artery plaque but no significant stenosis. The right vertebral origin is spared. The right vertebral artery is tortuous throughout the  neck but patent to the skull base without stenosis. Bulky calcified plaque in the proximal left subclavian artery. Subsequent stenosis is roughly 50%. The left vertebral artery origin is largely spared. The left vertebral is mildly dominant and patent to the skull base without stenosis. CTA HEAD Posterior circulation: Bilateral V4 segment calcified plaque without significant stenosis. The left V4 segment is dominant. Normal bilateral PICA origins. Patent vertebrobasilar junction and basilar artery without stenosis. Normal SCA and PCA origins. Posterior communicating arteries are diminutive or absent. Bilateral PCA branches are stable and within normal limits. Anterior circulation: Both ICA siphons are patent but heavily calcified. On the right there is up to  moderate stenosis in the supraclinoid segment. On the left there is moderate cavernous and supraclinoid stenosis. Patent carotid termini. Normal MCA and ACA origins. Anterior communicating artery and bilateral ACA branches are within normal limits aside from calcified atherosclerosis at the distal left ACA A2 or a 3 branch point (series 12, image 26), new since 2016. Right MCA M1 segment, bifurcation, and right MCA branches are stable and within normal limits. The left MCA M1 and left MCA trifurcation are patent without stenosis. No M2 occlusion is identified. But a posterior M3 branch of the middle M2 division is occluded as seen on series 12, image 35 and was patent on the 2016 CTA. Venous sinuses: Patent. Anatomic variants: Dominant left vertebral artery. Review of the MIP images confirms the above findings IMPRESSION: 1. Negative for LVO but positive for Left MCA M3 branch occlusion, and CTP detects 12 mL of ischemic penumbra in the corresponding posterior left MCA territory. No infarct core detected. This was reviewed by telephone with Kristina. Roland Rack on 04/18/2018 at 0820 hours. 2. Extensive chronic large vessel calcified atherosclerosis in the head and neck, and Aortic atherosclerosis (ICD10-I70.0). - moderate stenoses of the bilateral ICA siphons. - no hemodynamically significant cervical ICA or vertebral artery stenosis. - up to 50% stenosis of multiple proximal great vessels. Electronically Signed   By: Genevie Ann M.D.   On: 04/18/2018 08:36   Dg Bone Density (dxa)  Result Date: 03/28/2018 EXAM: DUAL X-RAY ABSORPTIOMETRY (DXA) FOR BONE MINERAL DENSITY IMPRESSION: Referring Physician:  [place] Your patient completed a BMD test using Lunar IDXA DXA system ( analysis version: 16 ) manufactured by [employer]. Technologist: KT PATIENT: Name: Mindi, Akerson Patient ID: [id number] Birth Date: [birth year] Height: 64.5 in. Sex: Female Measured: 03/28/2018 Weight: 182.0 lbs. Indications:  Advanced Age, Caucasian, Estrogen Deficient, Height Loss (781.91), History of Fracture (Adult) (V15.51), Postmenopausal, Rheumatoid Arthritis (714.0) Fractures: Fibula, Foot, Humerus Treatments: None ASSESSMENT: The BMD measured at Femur Neck Right is 0.852 g/cm2 with a T-score of -1.3. This patient is considered OSTEOPENIC according to [facility]) criteria. The scan quality is good. L-3 and L-4 were excluded due to degenerative changes. Site Region Measured Date Measured Age YA T-score BMD Significant CHANGE DualFemur Neck Right 03/28/2018    80.7         -1.3    0.852 g/cm2 AP Spine  L1-L2      03/28/2018    80.5         0.6     1.234 g/cm2 DualFemur Total Mean 03/28/2018    80.5         -0.9    0.889 g/cm2 World Health Organization [facility]) criteria for post-menopausal, Caucasian Women: Normal       T-score at or above -1 SD Osteopenia   T-score between -1 and -2.5  SD Osteoporosis T-score at or below -2.5 SD RECOMMENDATION: 1. All patients should optimize calcium and vitamin D intake. 2. Consider FDA approved medical therapies in postmenopausal women and men aged 34 years and older, based on the following: a. A hip or vertebral (clinical or morphometric) fracture b. T- score < or = -2.5 at the femoral neck or spine after appropriate evaluation to exclude secondary causes c. Low bone mass (T-score between -1.0 and -2.5 at the femoral neck or spine) and a 10 year probability of a hip fracture > or = 3% or a 10 year probability of a major osteoporosis-related fracture > or = 20% based on the [place]-adapted WHO algorithm d. Clinician judgment and/or patient preferences may indicate treatment for people with 10-year fracture probabilities above or below these levels FOLLOW-UP: People with diagnosed cases of osteoporosis or at high risk for fracture should have regular bone mineral density tests. For patients eligible for Medicare, routine testing is allowed once every 2 years. The testing frequency can be  increased to one year for patients who have rapidly progressing disease, those who are receiving or discontinuing medical therapy to restore bone mass, or have additional risk factors. I have reviewed this report and agree with the above findings. Mark A. Thornton Papas, M.D. [facility] FRAX* 10-year Probability of Fracture Based on femoral neck BMD: DualFemur (Right) Major Osteoporotic Fracture: 23.0% Hip Fracture:                5.1% Population:                  [place] (Caucasian) Risk Factors: History of Fracture (Adult) (V15.51), Rheumatoid Arthritis (714.0) *FRAX is a Materials engineer of the [employer] for Metabolic Bone Disease, a [place] (WHO) [facility]. ASSESSMENT: The probability of a major osteoporotic fracture is 23.0 % within the next ten years. The probability of hip fracture is  5.1  % within the next 10 years. I have reviewed this report and agree with the above findings. Mark A. Thornton Papas, M.D. [facility] Electronically Signed   By: Lavonia Dana M.D.   On: 03/28/2018 15:54   Dg Foot Complete Right  Result Date: 04/11/2018 CLINICAL DATA:  Diabetic ulcer RIGHT foot EXAM: RIGHT FOOT COMPLETE - 3+ VIEW COMPARISON:  03/13/2018 FINDINGS: Soft tissue ulceration in the lateral aspect of the forefoot adjacent to the fifth metatarsal phalangeal joint. No evidence of osseous erosion to suggest osteomyelitis. Two needle tips are in the soft tissues adjacent to the first metatarsal phalangeal joint. No change from prior. IMPRESSION: 1. Ulceration in the soft tissues adjacent to the fifth metatarsal phalangeal joint. 2. No evidence of osteomyelitis. 3. Chronic needle-like foreign bodies adjacent to the first metatarsal phalangeal joint. Electronically Signed   By: Suzy Bouchard M.D.   On: 04/11/2018 16:45   Ct Head Code Stroke Wo Contrast  Result Date: 04/18/2018 CLINICAL DATA:  Code stroke. 83 year old female with right facial droop last  seen normal 1730 hours yesterday. EXAM: CT HEAD WITHOUT CONTRAST TECHNIQUE: Contiguous axial images were obtained from the base of the skull through the vertex without intravenous contrast. COMPARISON:  04/10/2018 and earlier. FINDINGS: Brain: Left frontal operculum and middle frontal gyrus encephalomalacia appears stable from 1 week ago. No midline shift, mass effect, or evidence of intracranial mass lesion. No acute intracranial hemorrhage identified. No ventriculomegaly. No new cortical based infarct identified. Gray-white matter differentiation appears stable. Vascular: Extensive Calcified atherosclerosis at the skull base. No suspicious intracranial  vascular hyperdensity. Skull: No acute osseous abnormality identified. Sinuses/Orbits: Visualized paranasal sinuses and mastoids are stable and well pneumatized. Other: No acute orbit or scalp soft tissue findings. ASPECTS [facility] Stroke Program Early CT Score) - Ganglionic level infarction (caudate, lentiform nuclei, internal capsule, insula, M1-M3 cortex): 7 - Supraganglionic infarction (M4-M6 cortex): 3 Total score (0-10 with 10 being normal): 10 IMPRESSION: 1. Left frontal operculum and middle frontal gyrus encephalomalacia appears stable from 1 week ago. No acute intracranial hemorrhage or acute cortically based infarct identified. 2. ASPECTS is 10. 3. These results were communicated to Kristina. Leonel Ramsay at [place] 12/31/2019by text page via the [facility] messaging system. Electronically Signed   By: Genevie Ann M.D.   On: 04/18/2018 08:15   Vas US Carotid (at [facility] Only)  Result Date: 04/18/2018 Carotid Arterial Duplex Study Indications: CVA. Limitations: difficulty visualizing anatomy well due to tortuousity Performing Technologist: Abram Sander RVS  Examination Guidelines: A complete evaluation includes B-mode imaging, spectral Doppler, color Doppler, and power Doppler as needed of all accessible portions of each vessel. Bilateral testing is considered an  integral part of a complete examination. Limited examinations for reoccurring indications may be performed as noted.  Right Carotid Findings: +----------+--------+--------+--------+------------+--------+           PSV cm/sEDV cm/sStenosisDescribe    Comments +----------+--------+--------+--------+------------+--------+ CCA Prox  62                      heterogenous         +----------+--------+--------+--------+------------+--------+ CCA Distal43                      heterogenous         +----------+--------+--------+--------+------------+--------+ ICA Prox  82      10      1-39%   heterogenous         +----------+--------+--------+--------+------------+--------+ ICA Distal79      8                                    +----------+--------+--------+--------+------------+--------+ ECA       146                                          +----------+--------+--------+--------+------------+--------+ +----------+--------+-------+--------+-------------------+           PSV cm/sEDV cmsDescribeArm Pressure (mmHG) +----------+--------+-------+--------+-------------------+ Subclavian118                                        +----------+--------+-------+--------+-------------------+ +---------+--------+--+--------+-+---------+ VertebralPSV cm/s42EDV cm/s5Antegrade +---------+--------+--+--------+-+---------+  Left Carotid Findings: +----------+--------+--------+--------+------------+--------+           PSV cm/sEDV cm/sStenosisDescribe    Comments +----------+--------+--------+--------+------------+--------+ CCA Prox  91      7               heterogenous         +----------+--------+--------+--------+------------+--------+ CCA Distal70      8               heterogenous         +----------+--------+--------+--------+------------+--------+ ICA Prox  94      12      1-39%   heterogenous          +----------+--------+--------+--------+------------+--------+ ICA  Distal82      12                                   +----------+--------+--------+--------+------------+--------+ ECA       75      6                                    +----------+--------+--------+--------+------------+--------+ +----------+--------+--------+--------+-------------------+ SubclavianPSV cm/sEDV cm/sDescribeArm Pressure (mmHG) +----------+--------+--------+--------+-------------------+           91                                          +----------+--------+--------+--------+-------------------+ +---------+--------+--+--------+--+---------+ VertebralPSV cm/s93EDV cm/s12Antegrade +---------+--------+--+--------+--+---------+  Summary: Right Carotid: Velocities in the right ICA are consistent with a 1-39% stenosis. Left Carotid: Velocities in the left ICA are consistent with a 1-39% stenosis. Vertebrals: Bilateral vertebral arteries demonstrate antegrade flow. *See table(s) above for measurements and observations.  Electronically signed by Servando Snare MD on 04/18/2018 at 5:10:01 PM.    Final       Subjective: No new complaints.  Discharge Exam: Vitals:   04/21/18 0808 04/21/18 1130  BP: (!) 156/64 (!) 157/57  Pulse: 94 75  Resp: 18 18  Temp: 97.7 F (36.5 C) 98.1 F (36.7 C)  SpO2: 97% 94%   Vitals:   04/20/18 2326 04/21/18 0405 04/21/18 0808 04/21/18 1130  BP: (!) 152/58 (!) 144/120 (!) 156/64 (!) 157/57  Pulse: 81 97 94 75  Resp: 20 18 18 18   Temp: 98.5 F (36.9 C) 97.6 F (36.4 C) 97.7 F (36.5 C) 98.1 F (36.7 C)  TempSrc: Oral Oral Oral   SpO2: 97% 100% 97% 94%    General: Pt is alert, awake, not in acute distress Cardiovascular: RRR, S1/S2 +, no rubs, no gallops Respiratory: CTA bilaterally, no wheezing, no rhonchi Abdominal: Soft, NT, ND, bowel sounds + Extremities: no edema, no cyanosis    The results of significant diagnostics from this hospitalization  (including imaging, microbiology, ancillary and laboratory) are listed below for reference.     Microbiology: Recent Results (from the past 240 hour(s))  Surgical pcr screen     Status: Abnormal   Collection Time: 04/13/18 12:34 AM  Result Value Ref Range Status   MRSA, PCR POSITIVE (A) NEGATIVE Final    Comment: RESULT CALLED TO, READ BACK BY AND VERIFIED WITH: H HAMLIN RN 04/13/18 0318 JDW    Staphylococcus aureus POSITIVE (A) NEGATIVE Final    Comment: (NOTE) The Xpert SA Assay (FDA approved for NASAL specimens in patients 56 years of age and older), is one component of a comprehensive surveillance program. It is not intended to diagnose infection nor to guide or monitor treatment. Performed at [facility], [place] [address], [place], [place] [address]   Blood Cultures x 2 sites     Status: None (Preliminary result)   Collection Time: 04/18/18  4:40 PM  Result Value Ref Range Status   Specimen Description BLOOD RIGHT ARM  Final   Special Requests   Final    BOTTLES DRAWN AEROBIC ONLY Blood Culture adequate volume   Culture   Final    NO GROWTH 3 DAYS Performed at [facility], [address], [place], [place] [address]  Report Status PENDING  Incomplete  Blood Cultures x 2 sites     Status: None (Preliminary result)   Collection Time: 04/18/18  5:05 PM  Result Value Ref Range Status   Specimen Description BLOOD LEFT ANTECUBITAL  Final   Special Requests   Final    BOTTLES DRAWN AEROBIC ONLY Blood Culture adequate volume   Culture   Final    NO GROWTH 3 DAYS Performed at [facility], [address] [place], [place] [address]    Report Status PENDING  Incomplete     Labs: BNP (last 3 results) Recent Labs    03/13/18 1110 03/20/18 1721 04/10/18 1147  BNP 2,601.1* 1,119.8* 7,341.9*   Basic Metabolic Panel: Recent Labs  Lab 04/15/18 0405 04/18/18 0747 04/18/18 0802 04/19/18 0729  NA 136 132* 133* 138  K 4.7 4.1 4.0 4.0  CL 106 102 102  110  CO2 19* 20*  --  21*  GLUCOSE 109* 397* 413* 92  BUN 73* 62* 56* 49*  CREATININE 1.68* 1.64* 1.50* 1.36*  CALCIUM 8.7* 8.6*  --  8.1*   Liver Function Tests: Recent Labs  Lab 04/18/18 0747  AST 43*  ALT 41  ALKPHOS 104  BILITOT 0.9  PROT 7.3  ALBUMIN 2.1*   No results for input(s): LIPASE, AMYLASE in the last 168 hours. No results for input(s): AMMONIA in the last 168 hours. CBC: Recent Labs  Lab 04/15/18 0405 04/18/18 0747 04/18/18 0802 04/19/18 0729  WBC 9.2 15.9*  --  13.5*  NEUTROABS 8.0* 14.0*  --   --   HGB 8.6* 8.7* 9.9* 8.8*  HCT 27.5* 28.4* 29.0* 27.7*  MCV 86.8 85.8  --  86.8  PLT 211 163  --  154   Cardiac Enzymes: No results for input(s): CKTOTAL, CKMB, CKMBINDEX, TROPONINI in the last 168 hours. BNP: Invalid input(s): POCBNP CBG: Recent Labs  Lab 04/20/18 1108 04/20/18 1631 04/20/18 2129 04/21/18 0622 04/21/18 1128  GLUCAP 97 256* 270* 336* 379*   D-Dimer No results for input(s): DDIMER in the last 72 hours. Hgb A1c No results for input(s): HGBA1C in the last 72 hours. Lipid Profile Recent Labs    04/19/18 0427  CHOL 150  HDL 45  LDLCALC 89  TRIG 80  CHOLHDL 3.3   Thyroid function studies No results for input(s): TSH, T4TOTAL, T3FREE, THYROIDAB in the last 72 hours.  Invalid input(s): FREET3 Anemia work up No results for input(s): VITAMINB12, FOLATE, FERRITIN, TIBC, IRON, RETICCTPCT in the last 72 hours. Urinalysis    Component Value Date/Time   COLORURINE YELLOW 03/20/2018 1721   APPEARANCEUR HAZY (A) 03/20/2018 1721   LABSPEC 1.013 03/20/2018 1721   PHURINE 6.0 03/20/2018 1721   GLUCOSEU >=500 (A) 03/20/2018 1721   HGBUR LARGE (A) 03/20/2018 1721   BILIRUBINUR NEGATIVE 03/20/2018 1721   KETONESUR NEGATIVE 03/20/2018 1721   PROTEINUR 100 (A) 03/20/2018 1721   UROBILINOGEN 1.0 05/29/2014 1052   NITRITE NEGATIVE 03/20/2018 1721   LEUKOCYTESUR MODERATE (A) 03/20/2018 1721   Sepsis Labs Invalid input(s):  PROCALCITONIN,  WBC,  LACTICIDVEN Microbiology Recent Results (from the past 240 hour(s))  Surgical pcr screen     Status: Abnormal   Collection Time: 04/13/18 12:34 AM  Result Value Ref Range Status   MRSA, PCR POSITIVE (A) NEGATIVE Final    Comment: RESULT CALLED TO, READ BACK BY AND VERIFIED WITH: H HAMLIN RN 04/13/18 0318 JDW    Staphylococcus aureus POSITIVE (A) NEGATIVE Final    Comment: (NOTE)  The Xpert SA Assay (FDA approved for NASAL specimens in patients 26 years of age and older), is one component of a comprehensive surveillance program. It is not intended to diagnose infection nor to guide or monitor treatment. Performed at [facility], [place] [address] [place], [place] [address]   Blood Cultures x 2 sites     Status: None (Preliminary result)   Collection Time: 04/18/18  4:40 PM  Result Value Ref Range Status   Specimen Description BLOOD RIGHT ARM  Final   Special Requests   Final    BOTTLES DRAWN AEROBIC ONLY Blood Culture adequate volume   Culture   Final    NO GROWTH 3 DAYS Performed at [facility], [address] [place], [place] [address]    Report Status PENDING  Incomplete  Blood Cultures x 2 sites     Status: None (Preliminary result)   Collection Time: 04/18/18  5:05 PM  Result Value Ref Range Status   Specimen Description BLOOD LEFT ANTECUBITAL  Final   Special Requests   Final    BOTTLES DRAWN AEROBIC ONLY Blood Culture adequate volume   Culture   Final    NO GROWTH 3 DAYS Performed at [facility], [place] [address], [place], [place] [address]    Report Status PENDING  Incomplete     Time coordinating discharge: 35 minutes  SIGNED:   Hosie Poisson, MD  [facility] 04/21/2018, 11:58 AM Pager   If 7PM-7AM, please contact night-coverage [email or web address] Password [email or web address]

## 2018-04-21 NOTE — Progress Notes (Signed)
Results for EZME, DUCH (MRN [id number]) as of 04/21/2018 13:31  Ref. Range 04/20/2018 11:08 04/20/2018 16:31 04/20/2018 21:29 04/21/2018 06:22 04/21/2018 11:28  Glucose-Capillary Latest Ref Range: 70 - 99 mg/dL 97 256 (H) 270 (H) 336 (H) 379 (H)  Noted that blood sugars are greater than 180 mg/dl.  Recommend increasing NPH dosages to 18 units every am and 14 units every pm. Recommend increasing Novolog correction scale to RESISTANT (0-20 units) TID if blood sugars continue to be elevated.   Harvel Ricks RN BSN CDE Diabetes Coordinator Pager: [phone]  8am-5pm

## 2018-04-21 NOTE — Clinical Social Work Placement (Signed)
Nurse to call report to [phone], Room 603P   Transport set for 2:00 PM.     CLINICAL SOCIAL WORK PLACEMENT  NOTE  Date:  04/21/2018  Patient Details  Name: Kristina Summers MRN: [id number] Date of Birth: [birth year]  Clinical Social Work is seeking post-discharge placement for this patient at the [place] level of care (*CSW will initial, date and re-position this form in  chart as items are completed):  Yes   Patient/family provided with [facility] Work Department's list of facilities offering this level of care within the geographic area requested by the patient (or if unable, by the patient's family).  Yes   Patient/family informed of their freedom to choose among providers that offer the needed level of care, that participate in Medicare, Medicaid or managed care program needed by the patient, have an available bed and are willing to accept the patient.  Yes   Patient/family informed of [facility]'s ownership interest in [facility] and [facility], as well as of the fact that they are under no obligation to receive care at these facilities.  PASRR submitted to EDS on       PASRR number received on       Existing PASRR number confirmed on 04/20/18     FL2 transmitted to all facilities in geographic area requested by pt/family on 04/20/18     FL2 transmitted to all facilities within larger geographic area on       Patient informed that his/her managed care company has contracts with or will negotiate with certain facilities, including the following:        Yes   Patient/family informed of bed offers received.  Patient chooses bed at [facility]     Physician recommends and patient chooses bed at      Patient to be [facility] on 04/21/18.  Patient to be transferred to facility by [facility]     Patient family notified on 04/21/18 of transfer.  Name of family member notified:  Ronalee Belts     PHYSICIAN       Additional  Comment:    _______________________________________________ Geralynn Ochs, [place] 04/21/2018, 12:42 PM

## 2018-04-23 LAB — CULTURE, BLOOD (ROUTINE X 2)
Culture: NO GROWTH
Culture: NO GROWTH
Special Requests: ADEQUATE
Special Requests: ADEQUATE

## 2018-04-24 ENCOUNTER — Ambulatory Visit (HOSPITAL_COMMUNITY): Payer: Medicare Other

## 2018-04-26 ENCOUNTER — Ambulatory Visit (HOSPITAL_COMMUNITY): Payer: Medicare Other

## 2018-04-28 ENCOUNTER — Ambulatory Visit (HOSPITAL_COMMUNITY): Payer: Medicare Other

## 2018-05-01 ENCOUNTER — Ambulatory Visit (HOSPITAL_COMMUNITY): Payer: Medicare Other

## 2018-05-03 ENCOUNTER — Ambulatory Visit (HOSPITAL_COMMUNITY): Payer: Medicare Other

## 2018-05-05 ENCOUNTER — Ambulatory Visit (HOSPITAL_COMMUNITY): Payer: Medicare Other

## 2018-05-08 ENCOUNTER — Emergency Department (HOSPITAL_COMMUNITY): Payer: Medicare HMO

## 2018-05-08 ENCOUNTER — Ambulatory Visit (HOSPITAL_COMMUNITY): Payer: Medicare Other

## 2018-05-08 ENCOUNTER — Inpatient Hospital Stay (HOSPITAL_COMMUNITY)
Admission: EM | Admit: 2018-05-08 | Discharge: 2018-05-11 | DRG: 065 | Disposition: A | Discharge status: Skilled Nursing Facility | Payer: Medicare HMO | Source: Skilled Nursing Facility | Attending: Internal Medicine | Admitting: Internal Medicine

## 2018-05-08 ENCOUNTER — Inpatient Hospital Stay (HOSPITAL_COMMUNITY): Payer: Medicare HMO

## 2018-05-08 ENCOUNTER — Encounter (HOSPITAL_COMMUNITY): Payer: Self-pay

## 2018-05-08 ENCOUNTER — Other Ambulatory Visit: Payer: Self-pay

## 2018-05-08 DIAGNOSIS — H548 Legal blindness, as defined in USA: Secondary | ICD-10-CM | POA: Diagnosis present

## 2018-05-08 DIAGNOSIS — R29714 NIHSS score 14: Secondary | ICD-10-CM | POA: Diagnosis present

## 2018-05-08 DIAGNOSIS — Z951 Presence of aortocoronary bypass graft: Secondary | ICD-10-CM

## 2018-05-08 DIAGNOSIS — I10 Essential (primary) hypertension: Secondary | ICD-10-CM | POA: Diagnosis present

## 2018-05-08 DIAGNOSIS — Z6828 Body mass index (BMI) 28.0-28.9, adult: Secondary | ICD-10-CM | POA: Diagnosis not present

## 2018-05-08 DIAGNOSIS — I252 Old myocardial infarction: Secondary | ICD-10-CM

## 2018-05-08 DIAGNOSIS — E1122 Type 2 diabetes mellitus with diabetic chronic kidney disease: Secondary | ICD-10-CM | POA: Diagnosis present

## 2018-05-08 DIAGNOSIS — R531 Weakness: Secondary | ICD-10-CM | POA: Diagnosis not present

## 2018-05-08 DIAGNOSIS — I482 Chronic atrial fibrillation, unspecified: Secondary | ICD-10-CM | POA: Diagnosis present

## 2018-05-08 DIAGNOSIS — L97519 Non-pressure chronic ulcer of other part of right foot with unspecified severity: Secondary | ICD-10-CM | POA: Diagnosis not present

## 2018-05-08 DIAGNOSIS — E1151 Type 2 diabetes mellitus with diabetic peripheral angiopathy without gangrene: Secondary | ICD-10-CM | POA: Diagnosis not present

## 2018-05-08 DIAGNOSIS — E11621 Type 2 diabetes mellitus with foot ulcer: Secondary | ICD-10-CM | POA: Diagnosis present

## 2018-05-08 DIAGNOSIS — D696 Thrombocytopenia, unspecified: Secondary | ICD-10-CM | POA: Diagnosis present

## 2018-05-08 DIAGNOSIS — K219 Gastro-esophageal reflux disease without esophagitis: Secondary | ICD-10-CM | POA: Diagnosis present

## 2018-05-08 DIAGNOSIS — Z952 Presence of prosthetic heart valve: Secondary | ICD-10-CM | POA: Diagnosis not present

## 2018-05-08 DIAGNOSIS — I350 Nonrheumatic aortic (valve) stenosis: Secondary | ICD-10-CM

## 2018-05-08 DIAGNOSIS — I639 Cerebral infarction, unspecified: Secondary | ICD-10-CM

## 2018-05-08 DIAGNOSIS — I130 Hypertensive heart and chronic kidney disease with heart failure and stage 1 through stage 4 chronic kidney disease, or unspecified chronic kidney disease: Secondary | ICD-10-CM | POA: Diagnosis present

## 2018-05-08 DIAGNOSIS — D649 Anemia, unspecified: Secondary | ICD-10-CM | POA: Diagnosis present

## 2018-05-08 DIAGNOSIS — E1129 Type 2 diabetes mellitus with other diabetic kidney complication: Secondary | ICD-10-CM | POA: Diagnosis present

## 2018-05-08 DIAGNOSIS — I2510 Atherosclerotic heart disease of native coronary artery without angina pectoris: Secondary | ICD-10-CM | POA: Diagnosis present

## 2018-05-08 DIAGNOSIS — I5022 Chronic systolic (congestive) heart failure: Secondary | ICD-10-CM | POA: Diagnosis present

## 2018-05-08 DIAGNOSIS — N184 Chronic kidney disease, stage 4 (severe): Secondary | ICD-10-CM | POA: Diagnosis present

## 2018-05-08 DIAGNOSIS — I6349 Cerebral infarction due to embolism of other cerebral artery: Secondary | ICD-10-CM | POA: Diagnosis present

## 2018-05-08 DIAGNOSIS — E1142 Type 2 diabetes mellitus with diabetic polyneuropathy: Secondary | ICD-10-CM | POA: Diagnosis present

## 2018-05-08 DIAGNOSIS — E782 Mixed hyperlipidemia: Secondary | ICD-10-CM | POA: Diagnosis not present

## 2018-05-08 DIAGNOSIS — Z66 Do not resuscitate: Secondary | ICD-10-CM | POA: Diagnosis present

## 2018-05-08 DIAGNOSIS — I69341 Monoplegia of lower limb following cerebral infarction affecting right dominant side: Secondary | ICD-10-CM

## 2018-05-08 DIAGNOSIS — Z8719 Personal history of other diseases of the digestive system: Secondary | ICD-10-CM

## 2018-05-08 DIAGNOSIS — E663 Overweight: Secondary | ICD-10-CM | POA: Diagnosis present

## 2018-05-08 DIAGNOSIS — Z953 Presence of xenogenic heart valve: Secondary | ICD-10-CM | POA: Diagnosis not present

## 2018-05-08 DIAGNOSIS — Z87891 Personal history of nicotine dependence: Secondary | ICD-10-CM

## 2018-05-08 DIAGNOSIS — E785 Hyperlipidemia, unspecified: Secondary | ICD-10-CM | POA: Diagnosis present

## 2018-05-08 DIAGNOSIS — I480 Paroxysmal atrial fibrillation: Secondary | ICD-10-CM | POA: Diagnosis present

## 2018-05-08 DIAGNOSIS — I6310 Cerebral infarction due to embolism of unspecified precerebral artery: Secondary | ICD-10-CM | POA: Diagnosis not present

## 2018-05-08 DIAGNOSIS — Z7901 Long term (current) use of anticoagulants: Secondary | ICD-10-CM | POA: Diagnosis not present

## 2018-05-08 DIAGNOSIS — Z833 Family history of diabetes mellitus: Secondary | ICD-10-CM

## 2018-05-08 DIAGNOSIS — I69320 Aphasia following cerebral infarction: Secondary | ICD-10-CM

## 2018-05-08 DIAGNOSIS — G8191 Hemiplegia, unspecified affecting right dominant side: Secondary | ICD-10-CM | POA: Diagnosis present

## 2018-05-08 DIAGNOSIS — Z7952 Long term (current) use of systemic steroids: Secondary | ICD-10-CM

## 2018-05-08 DIAGNOSIS — I6340 Cerebral infarction due to embolism of unspecified cerebral artery: Secondary | ICD-10-CM

## 2018-05-08 DIAGNOSIS — Z794 Long term (current) use of insulin: Secondary | ICD-10-CM

## 2018-05-08 LAB — DIFFERENTIAL
Abs Immature Granulocytes: 0.09 10*3/uL — ABNORMAL HIGH (ref 0.00–0.07)
BASOS PCT: 0 %
Basophils Absolute: 0 10*3/uL (ref 0.0–0.1)
Eosinophils Absolute: 0 10*3/uL (ref 0.0–0.5)
Eosinophils Relative: 0 %
Immature Granulocytes: 1 %
Lymphocytes Relative: 2 %
Lymphs Abs: 0.3 10*3/uL — ABNORMAL LOW (ref 0.7–4.0)
MONOS PCT: 2 %
Monocytes Absolute: 0.2 10*3/uL (ref 0.1–1.0)
NEUTROS PCT: 95 %
Neutro Abs: 11.2 10*3/uL — ABNORMAL HIGH (ref 1.7–7.7)

## 2018-05-08 LAB — CBC
HCT: 26.3 % — ABNORMAL LOW (ref 36.0–46.0)
Hemoglobin: 7.7 g/dL — ABNORMAL LOW (ref 12.0–15.0)
MCH: 27 pg (ref 26.0–34.0)
MCHC: 29.3 g/dL — ABNORMAL LOW (ref 30.0–36.0)
MCV: 92.3 fL (ref 80.0–100.0)
PLATELETS: 156 10*3/uL (ref 150–400)
RBC: 2.85 MIL/uL — ABNORMAL LOW (ref 3.87–5.11)
RDW: 22.2 % — ABNORMAL HIGH (ref 11.5–15.5)
WBC: 11.8 10*3/uL — ABNORMAL HIGH (ref 4.0–10.5)
nRBC: 0 % (ref 0.0–0.2)

## 2018-05-08 LAB — COMPREHENSIVE METABOLIC PANEL
ALT: 39 U/L (ref 0–44)
AST: 42 U/L — ABNORMAL HIGH (ref 15–41)
Albumin: 2.1 g/dL — ABNORMAL LOW (ref 3.5–5.0)
Alkaline Phosphatase: 115 U/L (ref 38–126)
Anion gap: 9 (ref 5–15)
BUN: 59 mg/dL — AB (ref 8–23)
CO2: 23 mmol/L (ref 22–32)
Calcium: 8.4 mg/dL — ABNORMAL LOW (ref 8.9–10.3)
Chloride: 107 mmol/L (ref 98–111)
Creatinine, Ser: 1.66 mg/dL — ABNORMAL HIGH (ref 0.44–1.00)
GFR calc Af Amer: 33 mL/min — ABNORMAL LOW (ref >60)
GFR, EST NON AFRICAN AMERICAN: 29 mL/min — AB (ref >60)
Glucose, Bld: 143 mg/dL — ABNORMAL HIGH (ref 70–99)
Potassium: 4 mmol/L (ref 3.5–5.1)
Sodium: 139 mmol/L (ref 135–145)
Total Bilirubin: 1 mg/dL (ref 0.3–1.2)
Total Protein: 6.2 g/dL — ABNORMAL LOW (ref 6.5–8.1)

## 2018-05-08 LAB — I-STAT TROPONIN, ED: TROPONIN I, POC: 0.15 ng/mL — AB (ref 0.00–0.08)

## 2018-05-08 LAB — I-STAT CHEM 8, ED
BUN: 58 mg/dL — ABNORMAL HIGH (ref 8–23)
Calcium, Ion: 1.16 mmol/L (ref 1.15–1.40)
Chloride: 109 mmol/L (ref 98–111)
Creatinine, Ser: 1.5 mg/dL — ABNORMAL HIGH (ref 0.44–1.00)
Glucose, Bld: 138 mg/dL — ABNORMAL HIGH (ref 70–99)
HCT: 26 % — ABNORMAL LOW (ref 36.0–46.0)
Hemoglobin: 8.8 g/dL — ABNORMAL LOW (ref 12.0–15.0)
POTASSIUM: 4.1 mmol/L (ref 3.5–5.1)
Sodium: 140 mmol/L (ref 135–145)
TCO2: 23 mmol/L (ref 22–32)

## 2018-05-08 LAB — APTT: aPTT: 30 seconds (ref 24–36)

## 2018-05-08 LAB — PROTIME-INR
INR: 1.41
Prothrombin Time: 17.1 seconds — ABNORMAL HIGH (ref 11.4–15.2)

## 2018-05-08 LAB — GLUCOSE, CAPILLARY: GLUCOSE-CAPILLARY: 175 mg/dL — AB (ref 70–99)

## 2018-05-08 LAB — ETHANOL: Alcohol, Ethyl (B): <10 mg/dL (ref <10)

## 2018-05-08 MED ORDER — PANTOPRAZOLE SODIUM 40 MG PO TBEC
40.0000 mg | DELAYED_RELEASE_TABLET | Freq: Every day | ORAL | Status: DC
  Administered 2018-05-09 – 2018-05-11 (×3): 40 mg via ORAL
  Filled 2018-05-08 (×3): qty 1

## 2018-05-08 MED ORDER — AMLODIPINE BESYLATE 10 MG PO TABS
10.0000 mg | ORAL_TABLET | Freq: Every day | ORAL | Status: DC
  Administered 2018-05-09 – 2018-05-11 (×3): 10 mg via ORAL
  Filled 2018-05-08 (×3): qty 1

## 2018-05-08 MED ORDER — ONDANSETRON HCL 4 MG PO TABS: 4.0000 mg | ORAL_TABLET | Freq: Four times a day (QID) | ORAL | Status: DC | PRN

## 2018-05-08 MED ORDER — LIDOCAINE HCL 1 % IJ SOLN
INTRAMUSCULAR | Status: AC
  Filled 2018-05-08: qty 20

## 2018-05-08 MED ORDER — METOPROLOL TARTRATE 25 MG PO TABS
25.0000 mg | ORAL_TABLET | Freq: Two times a day (BID) | ORAL | Status: DC
  Administered 2018-05-08 – 2018-05-11 (×6): 25 mg via ORAL
  Filled 2018-05-08 (×6): qty 1

## 2018-05-08 MED ORDER — FERROUS SULFATE 325 (65 FE) MG PO TABS
325.0000 mg | ORAL_TABLET | Freq: Every day | ORAL | Status: DC
  Administered 2018-05-09 – 2018-05-11 (×3): 325 mg via ORAL
  Filled 2018-05-08 (×3): qty 1

## 2018-05-08 MED ORDER — SENNA 8.6 MG PO TABS: 1.0000 | ORAL_TABLET | ORAL | Status: DC | PRN

## 2018-05-08 MED ORDER — INSULIN NPH (HUMAN) (ISOPHANE) 100 UNIT/ML ~~LOC~~ SUSP
15.0000 [IU] | Freq: Every day | SUBCUTANEOUS | Status: DC
  Administered 2018-05-09 – 2018-05-10 (×2): 15 [IU] via SUBCUTANEOUS

## 2018-05-08 MED ORDER — ATORVASTATIN CALCIUM 80 MG PO TABS
80.0000 mg | ORAL_TABLET | Freq: Every day | ORAL | Status: DC
  Administered 2018-05-09 – 2018-05-10 (×2): 80 mg via ORAL
  Filled 2018-05-08 (×2): qty 1

## 2018-05-08 MED ORDER — ONDANSETRON HCL 4 MG/2ML IJ SOLN: 4.0000 mg | Freq: Four times a day (QID) | INTRAMUSCULAR | Status: DC | PRN

## 2018-05-08 MED ORDER — APIXABAN 5 MG PO TABS
5.0000 mg | ORAL_TABLET | Freq: Two times a day (BID) | ORAL | Status: DC
  Administered 2018-05-08 – 2018-05-10 (×4): 5 mg via ORAL
  Filled 2018-05-08 (×4): qty 1

## 2018-05-08 MED ORDER — INSULIN NPH (HUMAN) (ISOPHANE) 100 UNIT/ML ~~LOC~~ SUSP
10.0000 [IU] | Freq: Every day | SUBCUTANEOUS | Status: DC
  Administered 2018-05-08 – 2018-05-10 (×3): 10 [IU] via SUBCUTANEOUS
  Filled 2018-05-08: qty 10

## 2018-05-08 MED ORDER — ACETAMINOPHEN 650 MG RE SUPP: 650.0000 mg | Freq: Four times a day (QID) | RECTAL | Status: DC | PRN

## 2018-05-08 MED ORDER — ACETAMINOPHEN 325 MG PO TABS
650.0000 mg | ORAL_TABLET | Freq: Four times a day (QID) | ORAL | Status: DC | PRN
  Administered 2018-05-09 – 2018-05-11 (×2): 650 mg via ORAL
  Filled 2018-05-08 (×2): qty 2

## 2018-05-08 MED ORDER — INSULIN ASPART 100 UNIT/ML ~~LOC~~ SOLN
0.0000 [IU] | Freq: Three times a day (TID) | SUBCUTANEOUS | Status: DC
  Administered 2018-05-09: 1 [IU] via SUBCUTANEOUS
  Administered 2018-05-09: 3 [IU] via SUBCUTANEOUS
  Administered 2018-05-09: 2 [IU] via SUBCUTANEOUS
  Administered 2018-05-10: 3 [IU] via SUBCUTANEOUS
  Administered 2018-05-10: 9 [IU] via SUBCUTANEOUS
  Administered 2018-05-10: 3 [IU] via SUBCUTANEOUS

## 2018-05-08 MED ORDER — FUROSEMIDE 40 MG PO TABS
40.0000 mg | ORAL_TABLET | Freq: Every day | ORAL | Status: DC
  Administered 2018-05-09 – 2018-05-10 (×2): 40 mg via ORAL
  Filled 2018-05-08 (×2): qty 1

## 2018-05-08 MED ORDER — PREDNISONE 50 MG PO TABS
50.0000 mg | ORAL_TABLET | Freq: Every day | ORAL | Status: DC
  Administered 2018-05-09 – 2018-05-11 (×3): 50 mg via ORAL
  Filled 2018-05-08 (×3): qty 1

## 2018-05-08 NOTE — ED Notes (Signed)
Patient transported to CT 

## 2018-05-08 NOTE — ED Provider Notes (Signed)
MSE was initiated and I personally evaluated the patient and placed orders (if any) at  1:40 PM on May 08, 2018.  I discussed the case with Dr. Cheral Marker.  The patient apparently was last seen normal at 8:30 AM.  Lives in a facility.  Now having new right-sided weakness and EMS feels that he trouble speaking is progressively getting worse.  However given she has the trouble speaking at baseline, Dr. Cheral Marker recommends getting CT and doing stroke work-up but this would not be a code stroke for large vessel occlusion.  The patient appears stable so that the remainder of the MSE may be completed by another provider.   Sherwood [place], MD 05/08/18 1340

## 2018-05-08 NOTE — ED Notes (Signed)
Admitting physician bedside for evaluation and planning.

## 2018-05-08 NOTE — ED Notes (Signed)
I Stat Trop I result of 0.15 reported to Dr. Jeanell Sparrow.

## 2018-05-08 NOTE — Consult Note (Addendum)
Neurology Consultation  Reason for Consult: Increased weakness on the right side with concern for further stroke  Referring Physician: Dr. Maylene Roes  CC: Increased weakness on the right side  History is obtained from: Chart and daughters  HPI: Kristina Summers is an 83 y.o. female with a history of diabetes, TAVR, PVD, PAF, hypertension, hyperlipidemia, recent CVA, chronic kidney disease.  Patient recently had a stroke on 04/18/2018.  At that time it left patient with an expressive greater than receptive aphasia, along with right-sided weakness.  Patient was at rehab today when they noticed the patient had increased aphasia and right-sided weakness.  For that reason the patient was brought to the hospital for further evaluation.  She is on Eliquis and has taken her medication as prescribed.  Previous work-up was recently done and is as below:   2D Echocardiogram  - Left ventricle: The cavity size was mildly dilated. Wall thickness was normal. Systolic function was at the lower limits of normal. The estimated ejection fraction was in the range of 50% to 55%. Wall motion was normal; there were no regional wall motion abnormalities.  Carotid Doppler   There is 1-39% bilateral ICA stenosis. Vertebral artery flow is antegrade.    LDL 89 HbA1c 7.2 CTA of head and neck showed a left M3 branch occlusion, extensive large vessel disease.       LKW: Not exactly known however based on interview of family members, most likely yesterday tpa given?: no, recent stroke and also no known last known normal Premorbid modified Rankin scale (mRS): 4 NIHSS was difficult to ascertain secondary to aphasia; in this context the score is a 14.  ZOX:[id number] to obtain due to altered mental status.   Past Medical History:  Diagnosis Date  . Anemia   . CAD (coronary artery disease)    a. s/p CABG in 2000  . Carotid artery occlusion   . CKD (chronic kidney disease)   . Diastolic CHF ([place]) 40/9811  .  Fibromyalgia   . GERD (gastroesophageal reflux disease)   . History of CVA (cerebrovascular accident) 05/24/2014  . History of GI bleed   . Hyperlipidemia   . Hypertension   . Obesity   . PAF (paroxysmal atrial fibrillation) ([facility])    a. on Eliquis  . Peripheral vascular disease ([place])   . 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 Type II diabetes mellitus ([place]) dx'd 1979    Family History  Problem Relation Age of Onset  . Other Brother        intestinal blockage  . Diabetes Brother    Social History:   reports that she quit smoking about 28 years ago. Her smoking use included cigarettes. She has a 60.00 pack-year smoking history. She has never used smokeless tobacco. She reports previous alcohol use. She reports that she does not use drugs.  Medications  Current Facility-Administered Medications:  .  lidocaine (XYLOCAINE) 1 % (with pres) injection, , , ,   Current Outpatient Medications:  .  acetaminophen (TYLENOL) 325 MG tablet, Take 650 mg by mouth 3 (three) times daily., Disp: , Rfl:  .  acetaminophen (TYLENOL) 500 MG tablet, Take 500 mg by mouth daily as needed for moderate pain or headache., Disp: , Rfl:  .  Amino Acids-Protein Hydrolys (FEEDING SUPPLEMENT, PRO-STAT SUGAR FREE 64,) LIQD, Take 30 mLs by mouth 2 (two) times daily., Disp: 887 mL, Rfl:  0 .  amLODipine (NORVASC) 10 MG tablet, Take 10 mg by mouth daily. , Disp: , Rfl:  .  apixaban (ELIQUIS) 5 MG TABS tablet, Take 1 tablet (5 mg total) by mouth 2 (two) times daily., Disp: 60 tablet, Rfl: 1 .  atorvastatin (LIPITOR) 80 MG tablet, Take 1 tablet (80 mg total) by mouth daily. (Patient taking differently: Take by mouth daily at 6 PM. ), Disp: 30 tablet, Rfl: 0 .  collagenase (SANTYL) ointment, Apply 1 application topically See admin instructions. Apply to right foot ulcer daily, Disp: , Rfl:  .   ferrous sulfate 325 (65 FE) MG tablet, Take 325 mg by mouth daily with breakfast., Disp: , Rfl:  .  furosemide (LASIX) 40 MG tablet, Take 1 tablet (40 mg total) by mouth daily. Start tomorrow 12/6 (Patient taking differently: Take 40 mg by mouth daily. ), Disp: 30 tablet, Rfl: 1 .  insulin aspart (NOVOLOG) 100 UNIT/ML injection, CBG 70 - 120: 0 units CBG 121 - 150: 2 units CBG 151 - 200: 3 units CBG 201 - 250: 5 units CBG 251 - 300: 8 units CBG 301 - 350: 11 units CBG 351 - 400: 15 units, Disp: 10 mL, Rfl: 11 .  insulin NPH Human (HUMULIN N,NOVOLIN N) 100 UNIT/ML injection, Inject 0.18 mLs (18 Units total) into the skin daily before breakfast. (Patient taking differently: Inject 10 Units into the skin at bedtime. ), Disp: 30 mL, Rfl: 0 .  insulin NPH Human (HUMULIN N,NOVOLIN N) 100 UNIT/ML injection, Inject 0.12 mLs (12 Units total) into the skin daily before supper. (Patient taking differently: Inject 15 Units into the skin daily before breakfast. ), Disp: 30 mL, Rfl: 0 .  linagliptin (TRADJENTA) 5 MG TABS tablet, Take 5 mg by mouth daily., Disp: , Rfl:  .  metoprolol tartrate (LOPRESSOR) 25 MG tablet, Take 1 tablet (25 mg total) by mouth 2 (two) times daily., Disp: 60 tablet, Rfl: 0 .  Multiple Vitamin (MULTIVITAMIN WITH MINERALS) TABS tablet, Take 1 tablet by mouth daily. One a Day 50 plus, Disp: , Rfl:  .  mupirocin cream (BACTROBAN) 2 %, Apply topically 2 (two) times daily., Disp: 15 g, Rfl: 0 .  nitroGLYCERIN (NITROSTAT) 0.4 MG SL tablet, Place 0.4 mg under the tongue every 5 (five) minutes as needed for chest pain., Disp: , Rfl:  .  pantoprazole (PROTONIX) 40 MG tablet, Take 1 tablet (40 mg total) by mouth daily for 30 doses., Disp: 30 tablet, Rfl: 0 .  predniSONE (DELTASONE) 50 MG tablet, Take 1 tablet (50 mg total) by mouth daily with breakfast., Disp: 30 tablet, Rfl: 0 .  senna (SENOKOT) 8.6 MG TABS tablet, Take 1 tablet by mouth as needed for mild constipation., Disp: , Rfl:  .  nutrition  supplement, JUVEN, (JUVEN) PACK, Take 1 packet by mouth 2 (two) times daily between meals. (Patient not taking: Reported on 05/08/2018), Disp: 30 packet, Rfl: 0   Exam: Current vital signs: BP 126/67 (BP Location: Right Arm)   Pulse 71   Temp 97.6 F (36.4 C) (Oral)   Resp 16   Ht 5\' 6"  (1.676 m)   Wt 81.4 kg   SpO2 93%   BMI 28.96 kg/m  Vital signs in last 24 hours: Temp:  [97.6 F (36.4 C)] 97.6 F (36.4 C) (01/20 1356) Pulse Rate:  [71] 71 (01/20 1356) Resp:  [16] 16 (01/20 1356) BP: (126)/(67) 126/67 (01/20 1356) SpO2:  [93 %] 93 % (01/20 1356) Weight:  [81.4 kg] 81.4  kg (01/20 1358)  Physical Exam  Constitutional: Appears well-developed and well-nourished.  Psych: Affect appropriate to situation Eyes: No scleral injection HENT: No OP obstrucion Head: Normocephalic.  Cardiovascular: Normal rate and regular rhythm.  Respiratory: Effort normal, non-labored breathing GI: Soft.  No distension. There is no tenderness.  Skin: WDI  Neuro: Mental Status: Patient is awake, alert, a phasic and unable to give orientation. Patient is unable to give clear coherent history. Expressive aphasia pattern with a significant component of aphemia (speech apraxia). Also with dysarthria.  Cranial Nerves: II: Temporal visual fields are full.  She is legally blind but blinking to threat bilaterally. PERRL.  III,IV, VI: EOMI without ptosis or diplopia.  V: Facial sensation is symmetric VII: Right nasolabial fold decrease and mild right lower face decreased with motion.  VIII: hearing is intact to voice X: Palate elevates symmetrically XI: Shoulder shrug is symmetric. XII: tongue is midline without atrophy or fasciculations.  Motor: Right upper extremity is a 3/5, right lower extremity is a 3/5, left upper and lower extremities is a 5/5. Sensory: Patient indicates that both sides are symmetrical however this is difficult to ascertain as she does have expressive aphasia/aphemia.  Patient  does withdraw from noxious stimuli in both lower extremities and upper extremities.  She does have significant lower extremity stocking distribution of peripheral neuropathy Deep Tendon Reflexes: 1+ in the upper extremities with no knee jerk or ankle jerk Plantars: Mute bilaterally Cerebellar: Unable to ascertain  Labs I have reviewed labs in epic and the results pertinent to this consultation are:   CBC    Component Value Date/Time   WBC 11.8 (H) 05/08/2018 1400   RBC 2.85 (L) 05/08/2018 1400   HGB 8.8 (L) 05/08/2018 1425   HGB 7.3 (L) 02/28/2018 1137   HCT 26.0 (L) 05/08/2018 1425   HCT 23.3 (L) 02/28/2018 1137   PLT 156 05/08/2018 1400   PLT 228 02/28/2018 1137   MCV 92.3 05/08/2018 1400   MCV 89 02/28/2018 1137   MCH 27.0 05/08/2018 1400   MCHC 29.3 (L) 05/08/2018 1400   RDW 22.2 (H) 05/08/2018 1400   RDW 14.0 02/28/2018 1137   LYMPHSABS 0.3 (L) 05/08/2018 1400   MONOABS 0.2 05/08/2018 1400   EOSABS 0.0 05/08/2018 1400   BASOSABS 0.0 05/08/2018 1400    CMP     Component Value Date/Time   NA 140 05/08/2018 1425   NA 140 11/17/2017 1510   K 4.1 05/08/2018 1425   CL 109 05/08/2018 1425   CO2 23 05/08/2018 1400   GLUCOSE 138 (H) 05/08/2018 1425   BUN 58 (H) 05/08/2018 1425   BUN 28 (H) 11/17/2017 1510   CREATININE 1.50 (H) 05/08/2018 1425   CREATININE 1.54 (H) 01/26/2018 1127   CALCIUM 8.4 (L) 05/08/2018 1400   PROT 6.2 (L) 05/08/2018 1400   ALBUMIN 2.1 (L) 05/08/2018 1400   AST 42 (H) 05/08/2018 1400   ALT 39 05/08/2018 1400   ALKPHOS 115 05/08/2018 1400   BILITOT 1.0 05/08/2018 1400   GFRNONAA 29 (L) 05/08/2018 1400   GFRAA 33 (L) 05/08/2018 1400    Lipid Panel     Component Value Date/Time   CHOL 150 04/19/2018 0427   CHOL 111 02/07/2018 1135   TRIG 80 04/19/2018 0427   HDL 45 04/19/2018 0427   HDL 33 (L) 02/07/2018 1135   CHOLHDL 3.3 04/19/2018 0427   VLDL 16 04/19/2018 0427   LDLCALC 89 04/19/2018 0427   LDLCALC 61 02/07/2018 1135  Imaging I have reviewed the images obtained:  CT-scan of the brain-no acute intracranial abnormality.  Stable appearance of remote left MCA territory infarct, mild cortical atrophy and moderate chronic microvascular ischemic white matter disease  MRI examination of the brain-pending  Kristina Quill PA-C [facility] [phone] 05/08/2018, 5:17 PM     Assessment: 83 year old female with atrial fibrillation on Eliquis who was brought to the hospital for assessment of what physical therapy thought was worsening of right-sided weakness and facial droop, along with recrudescence of an aphasia that had been present from a prior stroke, but which had completely recovered per family. 1. At this time the differential diagnosis does include a new left-sided stroke versus recrudescence of old symptoms and signs in the setting of possible metabolic abnormalities versus infection. 2. Stroke risk factors: CAD, CHF, HTN, prior stroke, HLD, PVD, DM2  Recommendations: - As full stroke work up has recently been done over the last 3 months, would recommend obtaining MRI of the brain to assess for possible new stroke. No indication for repeating echocardiogram or carotid ultrasound. - No large ischemic stroke on CT. Therefore would continue with Eliquis unless MRI reveals a large stroke with high risk of hemorrhagic conversion. This latter possibility is felt to be unlikely based on her exam findings, which are more consistent with a small stroke. - We will need to continue with PT and OT -- Speech/Swallow evaluation -- IV hydration -- BP management. SBP goal of 120-140 given no definite last known well  I have seen and examined the patient. I have formulated the assessment and recommendations.  Electronically signed: Dr. Kerney Elbe

## 2018-05-08 NOTE — ED Triage Notes (Signed)
To ED via [facility] from [facility]- with staff stating that she was weaker and not moving around as normal-- EMS -- BP 183/62 p-71 CBG -256 iV Left ac 18g,

## 2018-05-08 NOTE — H&P (Signed)
History and Physical    Kristina Summers SKA:[id number] DOB: 1935/05/26 DOA: 05/08/2018  PCP: [facility] [place]  Patient coming from: SNF  Chief Complaint: Worsening right side weakness  HPI: Kristina Summers is a 83 y.o. female with medical history significant of DM, PVD, A Fib on Eliquis, S/p TAVR 11/08/2017, HTN, HLD, diastolic HF, CAD s/p CABG, CKD who presents with worsening right-sided weakness. She was recently evaluated at [facility] for aphasia as well as right leg weakness, found to have multiple left MCA infarcts with L M2/M3 occlusion, infarcts embolic secondary to known A fib. It was felt to be due to low dose Eliquis and her dosing was increased to 66mc BID, sent to SNF.   Daughters at bedside provide history.  Patient does not have any history of dementia, however can appear confused due to her hearing loss as well as legal blindness.  She is at baseline alert and oriented x3.  She continues to be aphasic as well as with right upper and right lower extremity weakness.  Daughter states that patient's aphasia since discharge from previous hospitalization is about the same or perhaps slightly improved.  No other issues noted, no fevers or chills, or any complaints voiced from patient.  ED Course: CT head without acute intracranial abnormality. Neurology consulted who recommended appropriate stroke work up.   Review of Systems: As per HPI otherwise 10 point review of systems negative.   Past Medical History:  Diagnosis Date  . Anemia   . CAD (coronary artery disease)    a. s/p CABG in 2000  . Carotid artery occlusion   . CKD (chronic kidney disease)   . Diastolic CHF ([place]) 20/3559  . Fibromyalgia   . GERD (gastroesophageal reflux disease)   . History of CVA (cerebrovascular accident) 05/24/2014  . History of GI bleed   . Hyperlipidemia   . Hypertension   . Obesity   . PAF (paroxysmal atrial fibrillation) (HCC)    a. on Eliquis  . Peripheral vascular disease ([place])   . 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 Type II diabetes mellitus ([place]) dx'd 1979    Past Surgical History:  Procedure Laterality Date  . ABDOMINAL AORTAGRAM N/A 04/01/2011   Procedure: ABDOMINAL AORTAGRAM;  Surgeon: Conrad Stearns, MD;  Location: [facility] CATH LAB;  Service: Cardiovascular;  Laterality: N/A;  . ANGIOPLASTY  06/17/11   Left leg common femoral artery cannulation under u/s Left leg runoff  . ARTERY BIOPSY Right 04/13/2018   Procedure: BIOPSY TEMPORAL ARTERY RIGHT;  Surgeon: Rosetta Posner, MD;  Location: [place];  Service: Vascular;  Laterality: Right;  . CARDIAC CATHETERIZATION  10/11/2017  . CARPAL TUNNEL RELEASE Right   . CATARACT EXTRACTION W/ INTRAOCULAR LENS  IMPLANT, BILATERAL  2004-2005  . CORONARY ARTERY BYPASS GRAFT  2000   CABG X5  . ESOPHAGOGASTRODUODENOSCOPY (EGD) WITH PROPOFOL N/A 12/25/2017   Procedure: ESOPHAGOGASTRODUODENOSCOPY (EGD) WITH PROPOFOL;  Surgeon: Wonda Horner, MD;  Location[facility] ENDOSCOPY;  Service: Endoscopy;  Laterality: N/A;  . EYE SURGERY Left    "lasered before cataract OR"  . GIVENS CAPSULE STUDY  03/14/2018  . GIVENS CAPSULE STUDY N/A 03/14/2018   Procedure: GIVENS CAPSULE STUDY;  Surgeon: Ronald Lobo, MD;  Location: [place];  Service: Endoscopy;  Laterality: N/A;  . LOWER EXTREMITY ANGIOGRAM Bilateral 04/01/2011   Procedure: LOWER EXTREMITY ANGIOGRAM;  Surgeon: Conrad Corunna,  MD;  Location: [place] CATH LAB;  Service: Cardiovascular;  Laterality: Bilateral;  . LOWER EXTREMITY ANGIOGRAM Left 06/17/2011   Procedure: LOWER EXTREMITY ANGIOGRAM;  Surgeon: Conrad Bland, MD;  Location: [facility] CATH LAB;  Service: Cardiovascular;  Laterality: Left;  . LOWER EXTREMITY ANGIOGRAM N/A 11/18/2011   Procedure: LOWER EXTREMITY ANGIOGRAM;  Surgeon: Conrad [place], MD;  Location: [facility] CATH LAB;  Service: Cardiovascular;  Laterality: N/A;  . PERCUTANEOUS  STENT INTERVENTION Right 04/01/2011   Procedure: PERCUTANEOUS STENT INTERVENTION;  Surgeon: Conrad Pantego, MD;  Location: [facility] CATH LAB;  Service: Cardiovascular;  Laterality: Right;  rt iliac stent  . PERIPHERAL ARTERIAL STENT GRAFT  04/01/11   right common iliac  . RIGHT/LEFT HEART CATH AND CORONARY/GRAFT ANGIOGRAPHY N/A 10/11/2017   Procedure: RIGHT/LEFT HEART CATH AND CORONARY/GRAFT ANGIOGRAPHY;  Surgeon: Sherren Mocha, MD;  Location: [place] CV LAB;  Service: Cardiovascular;  Laterality: N/A;  . TEE WITHOUT CARDIOVERSION N/A 11/08/2017   Procedure: TRANSESOPHAGEAL ECHOCARDIOGRAM (TEE);  Surgeon: Sherren Mocha, MD;  Location: [place];  Service: Open Heart Surgery;  Laterality: N/A;  . TEE WITHOUT CARDIOVERSION N/A 12/15/2017   Procedure: TRANSESOPHAGEAL ECHOCARDIOGRAM (TEE);  Surgeon: Larey Dresser, MD;  Location: [facility] ENDOSCOPY;  Service: Cardiovascular;  Laterality: N/A;  . TRANSCATHETER AORTIC VALVE REPLACEMENT, TRANSFEMORAL N/A 11/08/2017   Procedure: TRANSCATHETER AORTIC VALVE REPLACEMENT, TRANSFEMORAL;  Surgeon: Sherren Mocha, MD;  Location: [place];  Service: Open Heart Surgery;  Laterality: N/A;  . TRIGGER FINGER RELEASE Left 1996   thumb     reports that she quit smoking about 28 years ago. Her smoking use included cigarettes. She has a 60.00 pack-year smoking history. She has never used smokeless tobacco. She reports previous alcohol use. She reports that she does not use drugs.  Allergies  Allergen Reactions  . Morphine And Related Nausea And Vomiting and Other (See Comments)    Chest pain, also   . Lisinopril Cough  . Zoloft [Sertraline Hcl] Nausea Only         Family History  Problem Relation Age of Onset  . Other Brother        intestinal blockage  . Diabetes Brother     Prior to Admission medications   Medication Sig Start Date End Date Taking? Authorizing Provider  acetaminophen (TYLENOL) 325 MG tablet Take 650 mg by mouth 3 (three) times daily.   Yes  [provider]  acetaminophen (TYLENOL) 500 MG tablet Take 500 mg by mouth daily as needed for moderate pain or headache.   Yes [provider]  Amino Acids-Protein Hydrolys (FEEDING SUPPLEMENT, PRO-STAT SUGAR FREE 64,) LIQD Take 30 mLs by mouth 2 (two) times daily. 04/21/18  Yes Hosie Poisson, MD  amLODipine (NORVASC) 10 MG tablet Take 10 mg by mouth daily.    Yes [provider]  apixaban (ELIQUIS) 5 MG TABS tablet Take 1 tablet (5 mg total) by mouth 2 (two) times daily. 04/21/18  Yes Hosie Poisson, MD  atorvastatin (LIPITOR) 80 MG tablet Take 1 tablet (80 mg total) by mouth daily. Patient taking differently: Take by mouth daily at 6 PM.  11/27/12  Yes Baker, Alroy Dust H, PA-C  collagenase (SANTYL) ointment Apply 1 application topically See admin instructions. Apply to right foot ulcer daily   Yes [provider]  ferrous sulfate 325 (65 FE) MG tablet Take 325 mg by mouth daily with breakfast.   Yes [provider]  furosemide (LASIX) 40 MG tablet Take 1 tablet (40 mg total) by mouth daily.  Start tomorrow 12/6 Patient taking differently: Take 40 mg by mouth daily.  04/15/18  Yes Hall, Carole N, DO  insulin aspart (NOVOLOG) 100 UNIT/ML injection CBG 70 - 120: 0 units CBG 121 - 150: 2 units CBG 151 - 200: 3 units CBG 201 - 250: 5 units CBG 251 - 300: 8 units CBG 301 - 350: 11 units CBG 351 - 400: 15 units 04/21/18  Yes Hosie Poisson, MD  insulin NPH Human (HUMULIN N,NOVOLIN N) 100 UNIT/ML injection Inject 0.18 mLs (18 Units total) into the skin daily before breakfast. Patient taking differently: Inject 10 Units into the skin at bedtime.  04/16/18  Yes Hall, Carole N, DO  insulin NPH Human (HUMULIN N,NOVOLIN N) 100 UNIT/ML injection Inject 0.12 mLs (12 Units total) into the skin daily before supper. Patient taking differently: Inject 15 Units into the skin daily before breakfast.  04/15/18  Yes Hall, Carole N, DO  linagliptin (TRADJENTA) 5 MG TABS tablet Take  5 mg by mouth daily.   Yes [provider]  metoprolol tartrate (LOPRESSOR) 25 MG tablet Take 1 tablet (25 mg total) by mouth 2 (two) times daily. 03/18/18 05/08/18 Yes Arrien, Jimmy Picket, MD  Multiple Vitamin (MULTIVITAMIN WITH MINERALS) TABS tablet Take 1 tablet by mouth daily. One a Day 50 plus   Yes [provider]  mupirocin cream (BACTROBAN) 2 % Apply topically 2 (two) times daily. 04/21/18  Yes Hosie Poisson, MD  nitroGLYCERIN (NITROSTAT) 0.4 MG SL tablet Place 0.4 mg under the tongue every 5 (five) minutes as needed for chest pain.   Yes [provider]  pantoprazole (PROTONIX) 40 MG tablet Take 1 tablet (40 mg total) by mouth daily for 30 doses. 03/18/18 05/08/18 Yes Arrien, Jimmy Picket, MD  predniSONE (DELTASONE) 50 MG tablet Take 1 tablet (50 mg total) by mouth daily with breakfast. 04/21/18  Yes Hosie Poisson, MD  senna (SENOKOT) 8.6 MG TABS tablet Take 1 tablet by mouth as needed for mild constipation.   Yes [provider]  nutrition supplement, JUVEN, (JUVEN) PACK Take 1 packet by mouth 2 (two) times daily between meals. Patient not taking: Reported on 05/08/2018 04/15/18   Kayleen Memos, DO    Physical Exam: Vitals:   05/08/18 1356 05/08/18 1358  BP: 126/67   Pulse: 71   Resp: 16   Temp: 97.6 F (36.4 C)   TempSrc: Oral   SpO2: 93%   Weight:  81.4 kg  Height:  5\' 6"  (1.676 m)    Constitutional: NAD, calm, comfortable Eyes: PERRL, lids and conjunctivae normal ENMT: Mucous membranes are moist. Posterior pharynx clear of any exudate or lesions. Neck: normal, supple, no masses, no thyromegaly Respiratory: clear to auscultation bilaterally, no wheezing, no crackles. Normal respiratory effort. No accessory muscle use.  Cardiovascular: Irregular rhythm, rate 80s, no murmurs / rubs / gallops. No extremity edema. Abdomen: no tenderness, no masses palpated. No hepatosplenomegaly. Bowel sounds positive.  Musculoskeletal: no clubbing /  cyanosis. No joint deformity upper and lower extremities. No contractures. Normal muscle tone.  Skin: Right lateral plantar wound, packed, clean without surrounding erythema Neurologic: Strength left upper and left lower extremities 5/5, strength right upper extremity against gravity only, right lower extremity against gravity only, continues to be a phasic Psychiatric: Normal judgment and insight. Alert and oriented x 3. Normal mood.   Labs on Admission: I have personally reviewed following labs and imaging studies  CBC: Recent Labs  Lab 05/08/18 1400 05/08/18 1425  WBC 11.8*  --  NEUTROABS 11.2*  --   HGB 7.7* 8.8*  HCT 26.3* 26.0*  MCV 92.3  --   PLT 156  --    Basic Metabolic Panel: Recent Labs  Lab 05/08/18 1400 05/08/18 1425  NA 139 140  K 4.0 4.1  CL 107 109  CO2 23  --   GLUCOSE 143* 138*  BUN 59* 58*  CREATININE 1.66* 1.50*  CALCIUM 8.4*  --    GFR: Estimated Creatinine Clearance: 32.2 mL/min (A) (by C-G formula based on SCr of 1.5 mg/dL (H)). Liver Function Tests: Recent Labs  Lab 05/08/18 1400  AST 42*  ALT 39  ALKPHOS 115  BILITOT 1.0  PROT 6.2*  ALBUMIN 2.1*   No results for input(s): LIPASE, AMYLASE in the last 168 hours. No results for input(s): AMMONIA in the last 168 hours. Coagulation Profile: Recent Labs  Lab 05/08/18 1400  INR 1.41   Cardiac Enzymes: No results for input(s): CKTOTAL, CKMB, CKMBINDEX, TROPONINI in the last 168 hours. BNP (last 3 results) No results for input(s): PROBNP in the last 8760 hours. HbA1C: No results for input(s): HGBA1C in the last 72 hours. CBG: No results for input(s): GLUCAP in the last 168 hours. Lipid Profile: No results for input(s): CHOL, HDL, LDLCALC, TRIG, CHOLHDL, LDLDIRECT in the last 72 hours. Thyroid Function Tests: No results for input(s): TSH, T4TOTAL, FREET4, T3FREE, THYROIDAB in the last 72 hours. Anemia Panel: No results for input(s): VITAMINB12, FOLATE, FERRITIN, TIBC, IRON,  RETICCTPCT in the last 72 hours. Urine analysis:    Component Value Date/Time   COLORURINE YELLOW 03/20/2018 1721   APPEARANCEUR HAZY (A) 03/20/2018 1721   LABSPEC 1.013 03/20/2018 1721   PHURINE 6.0 03/20/2018 1721   GLUCOSEU >=500 (A) 03/20/2018 1721   HGBUR LARGE (A) 03/20/2018 1721   BILIRUBINUR NEGATIVE 03/20/2018 1721   KETONESUR NEGATIVE 03/20/2018 1721   PROTEINUR 100 (A) 03/20/2018 1721   UROBILINOGEN 1.0 05/29/2014 1052   NITRITE NEGATIVE 03/20/2018 1721   LEUKOCYTESUR MODERATE (A) 03/20/2018 1721   Sepsis Labs: !!!!!!!!!!!!!!!!!!!!!!!!!!!!!!!!!!!!!!!!!!!! @LABRCNTIP (procalcitonin:4,lacticidven:4) )No results found for this or any previous visit (from the past 240 hour(s)).   Radiological Exams on Admission: Ct Head Wo Contrast  Result Date: 05/08/2018 CLINICAL DATA:  83 year old female with weakness and decreased mobility. EXAM: CT HEAD WITHOUT CONTRAST TECHNIQUE: Contiguous axial images were obtained from the base of the skull through the vertex without intravenous contrast. COMPARISON:  Recent prior CT scan of the head 04/18/2018 FINDINGS: Brain: Similar appearance of left MCA territory encephalomalacia consistent with a remote infarct. Multiple additional punctate cortical infarcts visualized on the prior MRI are difficult to perceive by CT imaging. No definite new acute infarct, ischemia. No evidence of intracranial hemorrhage, mass lesion, mass effect or hydrocephalus. Stable atrophy and moderate chronic microvascular ischemic white matter disease. Vascular: No hyperdense vessel or unexpected calcification. Atherosclerotic calcifications in the bilateral cavernous and supraclinoid internal carotid arteries. Skull: Normal. Negative for fracture or focal lesion. Sinuses/Orbits: No acute finding. Other: None. IMPRESSION: 1. No acute intracranial abnormality. 2. Stable appearance of remote left MCA territory infarct, mild cortical atrophy and moderate chronic microvascular  ischemic white matter disease. Electronically Signed   By: Jacqulynn Cadet M.D.   On: 05/08/2018 15:57    EKG: Independently reviewed. A fib rate 70   Assessment/Plan Principal Problem:   Right sided weakness Active Problems:   CAD (coronary artery disease)   Atrial fibrillation, chronic   Diabetes mellitus type 2 with peripheral artery disease (HCC)   Hyperlipidemia  S/P CABG (coronary artery bypass graft)   History of GI bleed   Essential hypertension   Type II diabetes mellitus with renal manifestations (HCC)   Severe aortic stenosis   S/P TAVR (transcatheter aortic valve replacement)   CKD (chronic kidney disease) stage 4, GFR 15-29 ml/min (HCC)   Foot ulcer, right (HCC)  Right side weakness -Recent left MCA stroke, embolic  -Neurology consulted; MRI brain ordered, no further stroke work-up at this time as patient recently completed full work-up few weeks ago -PT OT SLP   Chronic A Fib -Eliquis  Chronic systolic CHF  -Lasix, lopressor   CKD stage 4 -Baseline Cr 1.5 -Stable  Chronically elevated troponin -Troponin has been elevated 0.03-0.07 in the past month  DM type 2, well controlled  -Ha1c 7.2 -Novolin and SSI   HTN -Norvasc  HLD -Lipitor   Chronic normocytic anemia -Baseline hemoglobin has been around 8-9  Chronic right foot ulcer -Recently completed doxycycline -Does not appear acutely infected at this time, surrounding wound without any erythema, no drainage noted -Wound RN consulted  Temporal arteritis -Follow up with [name]  -Continue prednisone   GERD -PPI    DVT prophylaxis: Eliquis Code Status: DNR  Family Communication: Daughters at bedside Disposition Plan: Pending further work up, suspect return back to SNF versus home with home health Consults called: Neurology consulted by EDP  Admission status: Inpatient   Severity of Illness: The appropriate patient status for this patient is INPATIENT. Inpatient status is judged to  be reasonable and necessary in order to provide the required intensity of service to ensure the patient's safety. The patient's presenting symptoms, physical exam findings, and initial radiographic and laboratory data in the context of their chronic comorbidities is felt to place them at high risk for further clinical deterioration. Furthermore, it is not anticipated that the patient will be medically stable for discharge from the hospital within 2 midnights of admission. The following factors support the patient status of inpatient.   " The patient's presenting symptoms include worsening right side weakness. " The worrisome physical exam findings include aphasia, right side weakness. " The chronic co-morbidities include DM, PVD, A Fib on Eliquis, S/p TAVR 11/08/2017, HTN, HLD, diastolic HF, CAD s/p CABG, CKD.   * I certify that at the point of admission it is my clinical judgment that the patient will require inpatient hospital care spanning beyond 2 midnights from the point of admission due to high intensity of service, high risk for further deterioration and high frequency of surveillance required.Dessa Phi, DO [facility] 05/08/2018, 5:28 PM

## 2018-05-08 NOTE — ED Provider Notes (Addendum)
[facility] EMERGENCY DEPARTMENT Provider Note   CSN: [id number] Arrival date & time: 05/08/18  1334     History   Chief Complaint Chief Complaint  Patient presents with  . stroke like symptoms    HPI Kristina Summers is a 83 y.o. female.  HPI 83 yo female with cad, stroke last month presents today with increased right sided weakness with lkn at 0830- patient presented at 1340.  Patient has baseline aphasia.  Patient initially seen and had assessment done by Dr. Regenia Skeeter who discussed with Dr. Cheral Marker.  Plan stroke evaluation although patient would not be lvo.   Past Medical History:  Diagnosis Date  . Anemia   . CAD (coronary artery disease)    a. s/p CABG in 2000  . Carotid artery occlusion   . CKD (chronic kidney disease)   . Diastolic CHF ([place]) 58/0998  . Fibromyalgia   . GERD (gastroesophageal reflux disease)   . History of CVA (cerebrovascular accident) 05/24/2014  . History of GI bleed   . Hyperlipidemia   . Hypertension   . Obesity   . PAF (paroxysmal atrial fibrillation) (HCC)    a. on Eliquis  . Peripheral vascular disease ([place])   . 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 Type II diabetes mellitus ([place]) dx'd 1979    Patient Active Problem List   Diagnosis Date Noted  . Acute ischemic stroke ([place])   . Labile blood glucose   . Benign essential HTN   . Leukocytosis   . CVA (cerebral vascular accident) ([place]) 04/18/2018  . CHF exacerbation ([place]) 04/10/2018  . Acute exacerbation of congestive heart failure ([place]) 03/20/2018  . Gastrointestinal hemorrhage   . Acute on chronic systolic CHF (congestive heart failure) ([place]) 03/13/2018  . Decreased hearing 02/03/2018  . History of myocardial infarction 02/03/2018  . Methicillin resistant Staphylococcus aureus infection 02/03/2018  . Acute blood loss anemia 12/22/2017  .  MRSA bacteremia 12/12/2017  . CKD (chronic kidney disease), stage III ([place]) 12/11/2017  . UTI (urinary tract infection) 12/11/2017  . Foot ulcer, right ([place]) 12/11/2017  . 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 Severe aortic stenosis 10/11/2017  . Hypochromic anemia 08/25/2017  . Essential hypertension 06/10/2017  . Type II diabetes mellitus with renal manifestations ([place]) 06/10/2017  . Acute on chronic congestive heart failure ([place]) 01/28/2017  . Fall   . Fracture of distal end of right fibula 05/29/2014  . History of CVA (cerebrovascular accident) 05/24/2014  . Peripheral vascular disease, unspecified ([place]) 01/11/2014  . CAD (coronary artery disease)   . Paroxysmal atrial fibrillation   . Diabetes mellitus type 2 with peripheral artery disease ([place])   . Hyperlipidemia   . History of GI bleed 04/21/2009  . S/P CABG (coronary artery bypass graft) 11/20/1998    Past Surgical History:  Procedure Laterality Date  . ABDOMINAL AORTAGRAM N/A 04/01/2011   Procedure: ABDOMINAL AORTAGRAM;  Surgeon: Conrad [place]mont, MD;  Location: [facility] CATH LAB;  Service: Cardiovascular;  Laterality: N/A;  . ANGIOPLASTY  06/17/11   Left leg common femoral artery cannulation under u/s Left leg runoff  . ARTERY BIOPSY Right 04/13/2018   Procedure: BIOPSY TEMPORAL ARTERY RIGHT;  Surgeon: Rosetta Posner, MD;  Location: [facility];  Service: Vascular;  Laterality: Right;  . CARDIAC CATHETERIZATION  10/11/2017  . CARPAL TUNNEL RELEASE Right   . CATARACT EXTRACTION  W/ INTRAOCULAR LENS  IMPLANT, BILATERAL  2004-2005  . CORONARY ARTERY BYPASS GRAFT  2000   CABG X5  . ESOPHAGOGASTRODUODENOSCOPY (EGD) WITH PROPOFOL N/A 12/25/2017   Procedure: ESOPHAGOGASTRODUODENOSCOPY (EGD) WITH PROPOFOL;  Surgeon: Wonda Horner, MD;  Location: [facility] ENDOSCOPY;  Service: Endoscopy;  Laterality: N/A;  . EYE SURGERY Left    "lasered before cataract OR"  . GIVENS CAPSULE STUDY  03/14/2018  . GIVENS CAPSULE STUDY N/A  03/14/2018   Procedure: GIVENS CAPSULE STUDY;  Surgeon: Ronald Lobo, MD;  Location: [facility];  Service: Endoscopy;  Laterality: N/A;  . LOWER EXTREMITY ANGIOGRAM Bilateral 04/01/2011   Procedure: LOWER EXTREMITY ANGIOGRAM;  Surgeon: Conrad [place], MD;  Location: [facility] CATH LAB;  Service: Cardiovascular;  Laterality: Bilateral;  . LOWER EXTREMITY ANGIOGRAM Left 06/17/2011   Procedure: LOWER EXTREMITY ANGIOGRAM;  Surgeon: Conrad [place], MD;  Location: [facility] CATH LAB;  Service: Cardiovascular;  Laterality: Left;  . LOWER EXTREMITY ANGIOGRAM N/A 11/18/2011   Procedure: LOWER EXTREMITY ANGIOGRAM;  Surgeon: Conrad [place], MD;  Location: [facility] CATH LAB;  Service: Cardiovascular;  Laterality: N/A;  . PERCUTANEOUS STENT INTERVENTION Right 04/01/2011   Procedure: PERCUTANEOUS STENT INTERVENTION;  Surgeon: Conrad [place], MD;  Location[facility] CATH LAB;  Service: Cardiovascular;  Laterality: Right;  rt iliac stent  . PERIPHERAL ARTERIAL STENT GRAFT  04/01/11   right common iliac  . RIGHT/LEFT HEART CATH AND CORONARY/GRAFT ANGIOGRAPHY N/A 10/11/2017   Procedure: RIGHT/LEFT HEART CATH AND CORONARY/GRAFT ANGIOGRAPHY;  Surgeon: Sherren Mocha, MD;  Location: [facility] CV LAB;  Service: Cardiovascular;  Laterality: N/A;  . TEE WITHOUT CARDIOVERSION N/A 11/08/2017   Procedure: TRANSESOPHAGEAL ECHOCARDIOGRAM (TEE);  Surgeon: Sherren Mocha, MD;  Location: [facility];  Service: Open Heart Surgery;  Laterality: N/A;  . TEE WITHOUT CARDIOVERSION N/A 12/15/2017   Procedure: TRANSESOPHAGEAL ECHOCARDIOGRAM (TEE);  Surgeon: Larey Dresser, MD;  Location: [facility] ENDOSCOPY;  Service: Cardiovascular;  Laterality: N/A;  . TRANSCATHETER AORTIC VALVE REPLACEMENT, TRANSFEMORAL N/A 11/08/2017   Procedure: TRANSCATHETER AORTIC VALVE REPLACEMENT, TRANSFEMORAL;  Surgeon: Sherren Mocha, MD;  Location: [facility];  Service: Open Heart Surgery;  Laterality: N/A;  . TRIGGER FINGER RELEASE Left 1996   thumb     OB History   No obstetric history on  file.      Home Medications    Prior to Admission medications   Medication Sig Start Date End Date Taking? Authorizing Provider  acetaminophen (TYLENOL) 325 MG tablet Take 650 mg by mouth 3 (three) times daily.   Yes [provider]  acetaminophen (TYLENOL) 500 MG tablet Take 500 mg by mouth daily as needed for moderate pain or headache.   Yes [provider]  Amino Acids-Protein Hydrolys (FEEDING SUPPLEMENT, PRO-STAT SUGAR FREE 64,) LIQD Take 30 mLs by mouth 2 (two) times daily. 04/21/18  Yes Hosie Poisson, MD  amLODipine (NORVASC) 10 MG tablet Take 10 mg by mouth daily.    Yes [provider]  apixaban (ELIQUIS) 5 MG TABS tablet Take 1 tablet (5 mg total) by mouth 2 (two) times daily. 04/21/18  Yes Hosie Poisson, MD  atorvastatin (LIPITOR) 80 MG tablet Take 1 tablet (80 mg total) by mouth daily. Patient taking differently: Take by mouth daily at 6 PM.  11/27/12  Yes Baker, Alroy Dust H, PA-C  collagenase (SANTYL) ointment Apply 1 application topically See admin instructions. Apply to right foot ulcer daily   Yes [provider]  ferrous sulfate 325 (65 FE) MG tablet Take 325 mg by mouth daily  with breakfast.   Yes [provider]  furosemide (LASIX) 40 MG tablet Take 1 tablet (40 mg total) by mouth daily. Start tomorrow 12/6 Patient taking differently: Take 40 mg by mouth daily.  04/15/18  Yes Hall, Carole N, DO  insulin aspart (NOVOLOG) 100 UNIT/ML injection CBG 70 - 120: 0 units CBG 121 - 150: 2 units CBG 151 - 200: 3 units CBG 201 - 250: 5 units CBG 251 - 300: 8 units CBG 301 - 350: 11 units CBG 351 - 400: 15 units 04/21/18  Yes Hosie Poisson, MD  insulin NPH Human (HUMULIN N,NOVOLIN N) 100 UNIT/ML injection Inject 0.18 mLs (18 Units total) into the skin daily before breakfast. Patient taking differently: Inject 10 Units into the skin at bedtime.  04/16/18  Yes Hall, Carole N, DO  insulin NPH Human (HUMULIN N,NOVOLIN N) 100 UNIT/ML injection Inject  0.12 mLs (12 Units total) into the skin daily before supper. Patient taking differently: Inject 15 Units into the skin daily before breakfast.  04/15/18  Yes Hall, Carole N, DO  linagliptin (TRADJENTA) 5 MG TABS tablet Take 5 mg by mouth daily.   Yes [provider]  metoprolol tartrate (LOPRESSOR) 25 MG tablet Take 1 tablet (25 mg total) by mouth 2 (two) times daily. 03/18/18 05/08/18 Yes Arrien, Jimmy Picket, MD  Multiple Vitamin (MULTIVITAMIN WITH MINERALS) TABS tablet Take 1 tablet by mouth daily. One a Day 50 plus   Yes [provider]  mupirocin cream (BACTROBAN) 2 % Apply topically 2 (two) times daily. 04/21/18  Yes Hosie Poisson, MD  nitroGLYCERIN (NITROSTAT) 0.4 MG SL tablet Place 0.4 mg under the tongue every 5 (five) minutes as needed for chest pain.   Yes [provider]  pantoprazole (PROTONIX) 40 MG tablet Take 1 tablet (40 mg total) by mouth daily for 30 doses. 03/18/18 05/08/18 Yes Arrien, Jimmy Picket, MD  predniSONE (DELTASONE) 50 MG tablet Take 1 tablet (50 mg total) by mouth daily with breakfast. 04/21/18  Yes Hosie Poisson, MD  senna (SENOKOT) 8.6 MG TABS tablet Take 1 tablet by mouth as needed for mild constipation.   Yes [provider]  nutrition supplement, JUVEN, (JUVEN) PACK Take 1 packet by mouth 2 (two) times daily between meals. Patient not taking: Reported on 05/08/2018 04/15/18   Kayleen Memos, DO    Family History Family History  Problem Relation Age of Onset  . Other Brother        intestinal blockage  . Diabetes Brother     Social History Social History   Tobacco Use  . Smoking status: Former Smoker    Packs/day: 2.00    Years: 30.00    Pack years: 60.00    Types: Cigarettes    Last attempt to quit: 04/19/1990    Years since quitting: 28.0  . Smokeless tobacco: Never Used  . Tobacco comment: stopped smoking cigarettes 1991  Substance Use Topics  . Alcohol use: Not Currently    Alcohol/week: 0.0 standard drinks     Comment: "tried different alcohols when I was 1st married; never drank much at  ALL"  . Drug use: Never     Allergies   Morphine and related; Lisinopril; and Zoloft [sertraline hcl]   Review of Systems Review of Systems  All other systems reviewed and are negative.    Physical Exam Updated Vital Signs BP 126/67 (BP Location: Right Arm)   Pulse 71   Temp 97.6 F (36.4 C) (Oral)   Resp 16  Ht 1.676 m (5\' 6" )   Wt 81.4 kg   SpO2 93%   BMI 28.96 kg/m   Physical Exam Vitals signs and nursing note reviewed.  Constitutional:      Appearance: Normal appearance.  HENT:     Head: Normocephalic.     Right Ear: External ear normal.     Left Ear: External ear normal.     Nose: Nose normal.     Mouth/Throat:     Mouth: Mucous membranes are dry.  Eyes:     Pupils: Pupils are equal, round, and reactive to light.  Neck:     Musculoskeletal: Normal range of motion.  Cardiovascular:     Rate and Rhythm: Normal rate and regular rhythm.  Pulmonary:     Effort: Pulmonary effort is normal.     Breath sounds: Normal breath sounds.  Abdominal:     General: Abdomen is flat.     Palpations: Abdomen is soft.  Musculoskeletal: Normal range of motion.  Skin:    General: Skin is warm and dry.     Capillary Refill: Capillary refill takes less than 2 seconds.  Neurological:     Mental Status: She is alert.     Motor: Weakness present.     Comments: Patient with aphasia Right sided palmar drift Decreased movement of right leg drift Wound bottom of right foot Gait not assessed Unable to assess orientation due to aphasia      ED Treatments / Results  Labs (all labs ordered are listed, but only abnormal results are displayed) Labs Reviewed  PROTIME-INR - Abnormal; Notable for the following components:      Result Value   Prothrombin Time 17.1 (*)    All other components within normal limits  CBC - Abnormal; Notable for the following components:   WBC 11.8 (*)    RBC 2.85  (*)    Hemoglobin 7.7 (*)    HCT 26.3 (*)    MCHC 29.3 (*)    RDW 22.2 (*)    All other components within normal limits  DIFFERENTIAL - Abnormal; Notable for the following components:   Neutro Abs 11.2 (*)    Lymphs Abs 0.3 (*)    Abs Immature Granulocytes 0.09 (*)    All other components within normal limits  COMPREHENSIVE METABOLIC PANEL - Abnormal; Notable for the following components:   Glucose, Bld 143 (*)    BUN 59 (*)    Creatinine, Ser 1.66 (*)    Calcium 8.4 (*)    Total Protein 6.2 (*)    Albumin 2.1 (*)    AST 42 (*)    GFR calc non Af Amer 29 (*)    GFR calc Af Amer 33 (*)    All other components within normal limits  I-STAT CHEM 8, ED - Abnormal; Notable for the following components:   BUN 58 (*)    Creatinine, Ser 1.50 (*)    Glucose, Bld 138 (*)    Hemoglobin 8.8 (*)    HCT 26.0 (*)    All other components within normal limits  I-STAT TROPONIN, ED - Abnormal; Notable for the following components:   Troponin i, poc 0.15 (*)    All other components within normal limits  ETHANOL  APTT  RAPID URINE DRUG SCREEN, HOSP PERFORMED  URINALYSIS, ROUTINE W REFLEX MICROSCOPIC    EKG EKG Interpretation  Date/Time:  Monday May 08 2018 13:55:00 EST Ventricular Rate:  71 PR Interval:    QRS Duration: 112 QT  Interval:  397 QTC Calculation: 432 R Axis:   97 Text Interpretation:  Atrial fibrillation Ventricular premature complex Anterior infarct, old Minimal ST depression Confirmed by Pattricia Boss [id number]) on 05/08/2018 4:12:29 PM   Radiology Ct Head Wo Contrast  Result Date: 05/08/2018 CLINICAL DATA:  83 year old female with weakness and decreased mobility. EXAM: CT HEAD WITHOUT CONTRAST TECHNIQUE: Contiguous axial images were obtained from the base of the skull through the vertex without intravenous contrast. COMPARISON:  Recent prior CT scan of the head 04/18/2018 FINDINGS: Brain: Similar appearance of left MCA territory encephalomalacia consistent with a remote  infarct. Multiple additional punctate cortical infarcts visualized on the prior MRI are difficult to perceive by CT imaging. No definite new acute infarct, ischemia. No evidence of intracranial hemorrhage, mass lesion, mass effect or hydrocephalus. Stable atrophy and moderate chronic microvascular ischemic white matter disease. Vascular: No hyperdense vessel or unexpected calcification. Atherosclerotic calcifications in the bilateral cavernous and supraclinoid internal carotid arteries. Skull: Normal. Negative for fracture or focal lesion. Sinuses/Orbits: No acute finding. Other: None. IMPRESSION: 1. No acute intracranial abnormality. 2. Stable appearance of remote left MCA territory infarct, mild cortical atrophy and moderate chronic microvascular ischemic white matter disease. Electronically Signed   By: Jacqulynn Cadet M.D.   On: 05/08/2018 15:57    Procedures .Critical Care Performed by: Pattricia Boss, MD Authorized by: Pattricia Boss, MD   Critical care provider statement:    Critical care time (minutes):  45   Critical care end time:  05/08/2018 4:29 PM   Critical care was necessary to treat or prevent imminent or life-threatening deterioration of the following conditions:  CNS failure or compromise   Critical care was time spent personally by me on the following activities:  Discussions with consultants, evaluation of patient's response to treatment, examination of patient, ordering and performing treatments and interventions, ordering and review of laboratory studies, ordering and review of radiographic studies, pulse oximetry, re-evaluation of patient's condition, obtaining history from patient or surrogate and review of old charts   (including critical care time)  Medications Ordered in ED Medications - No data to display   Initial Impression / Assessment and Plan / ED Course  I have reviewed the triage vital signs and the nursing notes.  Pertinent labs & imaging results that were  available during my care of the patient were reviewed by me and considered in my medical decision making (see chart for details).    Patient with recent stroke presents today with increased right sided weakness.  Head ct without bleed. 1- stroke- patient with recent embolic stroke presents today with worsening right sided weakness. Patient did not meet criteria for code stroke. Discussed with neurology- 2- a fib rvr- appears stable 3-elevated troponin- no active chest pain here.  Review of chart reveals chronic troponin elevation 4- anemia- hgb /8.8- last hgb 8.8stable from prior 5 ckd-stable  Discussed with Dr. Maylene Roes and will see for admission  Final Clinical Impressions(s) / ED Diagnoses   Final diagnoses:  Cerebrovascular accident (CVA), unspecified mechanism [facility])    ED Discharge Orders    None       Pattricia Boss, MD 05/08/18 1629    Pattricia Boss, MD 05/08/18 [phone]

## 2018-05-08 NOTE — Progress Notes (Signed)
Transport attempted to get pt for MRI. Pt refused to come down at this time because pt wanted to eat dinner. TBD later time.

## 2018-05-09 DIAGNOSIS — E1122 Type 2 diabetes mellitus with diabetic chronic kidney disease: Secondary | ICD-10-CM

## 2018-05-09 DIAGNOSIS — Z8719 Personal history of other diseases of the digestive system: Secondary | ICD-10-CM

## 2018-05-09 DIAGNOSIS — E782 Mixed hyperlipidemia: Secondary | ICD-10-CM

## 2018-05-09 DIAGNOSIS — E1151 Type 2 diabetes mellitus with diabetic peripheral angiopathy without gangrene: Secondary | ICD-10-CM

## 2018-05-09 DIAGNOSIS — I482 Chronic atrial fibrillation, unspecified: Secondary | ICD-10-CM

## 2018-05-09 DIAGNOSIS — N184 Chronic kidney disease, stage 4 (severe): Secondary | ICD-10-CM

## 2018-05-09 DIAGNOSIS — I6340 Cerebral infarction due to embolism of unspecified cerebral artery: Secondary | ICD-10-CM

## 2018-05-09 DIAGNOSIS — Z794 Long term (current) use of insulin: Secondary | ICD-10-CM

## 2018-05-09 DIAGNOSIS — Z951 Presence of aortocoronary bypass graft: Secondary | ICD-10-CM

## 2018-05-09 DIAGNOSIS — I350 Nonrheumatic aortic (valve) stenosis: Secondary | ICD-10-CM

## 2018-05-09 DIAGNOSIS — I2510 Atherosclerotic heart disease of native coronary artery without angina pectoris: Secondary | ICD-10-CM

## 2018-05-09 DIAGNOSIS — L97519 Non-pressure chronic ulcer of other part of right foot with unspecified severity: Secondary | ICD-10-CM

## 2018-05-09 DIAGNOSIS — N183 Chronic kidney disease, stage 3 (moderate): Secondary | ICD-10-CM

## 2018-05-09 DIAGNOSIS — Z952 Presence of prosthetic heart valve: Secondary | ICD-10-CM

## 2018-05-09 DIAGNOSIS — I10 Essential (primary) hypertension: Secondary | ICD-10-CM

## 2018-05-09 LAB — CBC
HCT: 24.1 % — ABNORMAL LOW (ref 36.0–46.0)
Hemoglobin: 7.1 g/dL — ABNORMAL LOW (ref 12.0–15.0)
MCH: 26.2 pg (ref 26.0–34.0)
MCHC: 29.5 g/dL — ABNORMAL LOW (ref 30.0–36.0)
MCV: 88.9 fL (ref 80.0–100.0)
Platelets: 145 10*3/uL — ABNORMAL LOW (ref 150–400)
RBC: 2.71 MIL/uL — ABNORMAL LOW (ref 3.87–5.11)
RDW: 22.5 % — ABNORMAL HIGH (ref 11.5–15.5)
WBC: 11.2 10*3/uL — ABNORMAL HIGH (ref 4.0–10.5)
nRBC: 0 % (ref 0.0–0.2)

## 2018-05-09 LAB — BASIC METABOLIC PANEL
Anion gap: 7 (ref 5–15)
BUN: 61 mg/dL — ABNORMAL HIGH (ref 8–23)
CHLORIDE: 110 mmol/L (ref 98–111)
CO2: 24 mmol/L (ref 22–32)
Calcium: 8.3 mg/dL — ABNORMAL LOW (ref 8.9–10.3)
Creatinine, Ser: 1.69 mg/dL — ABNORMAL HIGH (ref 0.44–1.00)
GFR calc Af Amer: 33 mL/min — ABNORMAL LOW (ref >60)
GFR calc non Af Amer: 28 mL/min — ABNORMAL LOW (ref >60)
Glucose, Bld: 147 mg/dL — ABNORMAL HIGH (ref 70–99)
Potassium: 3.9 mmol/L (ref 3.5–5.1)
SODIUM: 141 mmol/L (ref 135–145)

## 2018-05-09 LAB — GLUCOSE, CAPILLARY
Glucose-Capillary: 138 mg/dL — ABNORMAL HIGH (ref 70–99)
Glucose-Capillary: 198 mg/dL — ABNORMAL HIGH (ref 70–99)
Glucose-Capillary: 216 mg/dL — ABNORMAL HIGH (ref 70–99)
Glucose-Capillary: 260 mg/dL — ABNORMAL HIGH (ref 70–99)

## 2018-05-09 MED ORDER — COLLAGENASE 250 UNIT/GM EX OINT
TOPICAL_OINTMENT | Freq: Every day | CUTANEOUS | Status: DC
  Administered 2018-05-09 – 2018-05-11 (×3): via TOPICAL
  Filled 2018-05-09: qty 30

## 2018-05-09 NOTE — Plan of Care (Signed)
Mod assist with adls 

## 2018-05-09 NOTE — Progress Notes (Signed)
PROGRESS NOTE    Kristina Summers  PFX:[id number] DOB: 10/08/1935 DOA: 05/08/2018 PCP: [facility] [place]   Brief Narrative:  HPI per Dr. Dessa Phi on 05/09/2018 [name] is a 83 y.o. female with medical history significant of DM, PVD, A Fib on Eliquis, S/p TAVR 11/08/2017, HTN, HLD, diastolic HF, CAD s/p CABG, CKD who presents with worsening right-sided weakness. She was recently evaluated at [facility] for aphasia as well as right leg weakness, found to have multiple left MCA infarcts with L M2/M3 occlusion, infarcts embolic secondary to known A fib. It was felt to be due to low dose Eliquis and her dosing was increased to 30mc BID, sent to SNF.   Daughters at bedside provide history.  Patient does not have any history of dementia, however can appear confused due to her hearing loss as well as legal blindness.  She is at baseline alert and oriented x3.  She continues to be aphasic as well as with right upper and right lower extremity weakness.  Daughter states that patient's aphasia since discharge from previous hospitalization is about the same or perhaps slightly improved.  No other issues noted, no fevers or chills, or any complaints voiced from patient.  ED Course: CT head without acute intracranial abnormality. Neurology consulted who recommended appropriate stroke work up.    **Undergoing CVA workup and Neurology recommending continuing Eliquis at this time. PT/OT recommending SNF   Assessment & Plan:   Principal Problem:   Right sided weakness Active Problems:   CAD (coronary artery disease)   Atrial fibrillation, chronic   Diabetes mellitus type 2 with peripheral artery disease (HCC)   Hyperlipidemia   S/P CABG (coronary artery bypass graft)   History of GI bleed   Essential hypertension   Type II diabetes mellitus with renal manifestations (HCC)   Severe aortic stenosis   S/P TAVR (transcatheter aortic valve replacement)   CKD (chronic kidney disease) stage 4, GFR 15-29  ml/min (HCC)   Foot ulcer, right (HCC)   Cerebral embolism with cerebral infarction   Right side weakness with New Left brain Infarcts -Recent left MCA stroke, embolic  -MRI showed the left midbrain infarcts in the left posterior central gyrus in the superior frontal gyrus and old infarcts -LDL is 89 -HbA1c was 7.2 -Neurology evaluated and recommend continuing Eliquis at this time given no data switching to an alternative anticoagulant or adding aspirin with no further stroke work-up needed and following up with Dr. Lavell Anchors at [facility] neurology in 05/25/2018 at 71 -Neurology consulted; MRI brain ordered and as above, no further stroke work-up at this time as patient recently completed full work-up few weeks ago -PT OT SLP recommending SNF   Chronic A Fib -Continue apixaban daily but reduce dose due to kidney function  Chronic systolic CHF  -Strict I's and O's, daily weights -Continue Lasix and metoprolol  CKD stage 4 -Baseline Cr 1.5 and repeat this morning was 1.69 -Stable -We will need to reduce dosing of Eliquis due to renal function -Nephrotoxic medications possible and continue to monitor and trend renal function  Chronically elevated troponin -Troponin has been elevated 0.03-0.07 in the past month  DM type 2, well controlled  -Ha1c 7.2 -Continue with Novolin and SSI  -CBGs have been ranging from 1 38-2 16  HTN -BP was 155/65 -C/w Amlodipine   HLD -C/w Atorvastatin 80 mg nightly with a goal of less than 70 -Recent lipid panel done from previous stroke work-up  Chronic normocytic anemia -Baseline hemoglobin has  been around 8-9 -Globin/hematocrit went from 7.7/20 6.3-8 0.8/26.0 and now 7.1/24.1 -Continue monitor for signs and symptoms  Of bleeding as patient is anticoagulated and repeat CBC in a.m. and transfuse if less than 7  Chronic right foot ulcer -Recently completed doxycycline -WBC is trending down from 11.8 and now 11.2 -Does not appear acutely  infected at this time, surrounding wound without any erythema, no drainage noted -Wound RN consulted and dressing changes initiated   Temporal arteritis -Follow up with [name]  -Continue Prednisone   GERD -C/w PPI   Thrombocytopenia -Patient's platelet count went from 156 and is now 145 -Daily monitor for signs or symptoms bleeding -Repeat CBC in a.m.  Overweight -Estimated body mass index is 28.96 kg/m as calculated from the following:   Height as of this encounter: 5\' 6"  (1.676 m).   Weight as of this encounter: 81.4 kg. -Weight Loss Counseling given   DVT prophylaxis: Anticoagulated with Apixaban  Code Status: DO NOT RESUSCITATE  Family Communication: No family present at bedside  Disposition Plan: SNF   Consultants:   Neurology Stroke Team   Procedures:  ECHOCARDIOGRAM from 04/18/18 ------------------------------------------------------------------- Study Conclusions  - Left ventricle: The cavity size was mildly dilated. Wall   thickness was normal. Systolic function was at the lower limits   of normal. The estimated ejection fraction was in the range of   50% to 55%. Wall motion was normal; there were no regional wall   motion abnormalities. - Ventricular septum: Septal motion showed paradox. These changes   are consistent with a left bundle branch block. - Aortic valve: A stent-valve (TAVR) bioprosthesis was present and   functioning normally. There was trivial regurgitation. There was   trivial perivalvular regurgitation. Valve area (VTI): 1.73 cm^2.   Valve area (Vmax): 1.83 cm^2. Valve area (Vmean): 1.87 cm^2. - Mitral valve: There was mild regurgitation. - Left atrium: The atrium was mildly dilated. - Right ventricle: Systolic function was reduced. - Right atrium: The atrium was mildly dilated. - Pulmonary arteries: Systolic pressure was mildly increased. PA   peak pressure: 40 mm Hg (S).   Antimicrobials:  Anti-infectives (From admission,  onward)   None     Subjective: Seen and examined at bedside and she has significant expressive difficulties with moderate aphasia and has a pseudobulbar speech.  Difficult to assess.  No nausea or vomiting.  States that her foot is not pending no other concerns or complaints at this time  Objective: Vitals:   05/09/18 1113 05/09/18 1114 05/09/18 1152 05/09/18 1616  BP:  (!) 142/69 (!) 157/97 (!) 155/65  Pulse: 70 70 76 71  Resp: 18 13 16 17   Temp:   (!) 97.3 F (36.3 C) (!) 97.4 F (36.3 C)  TempSrc:   Oral Oral  SpO2: 93% 93% 97% 96%  Weight:      Height:        Intake/Output Summary (Last 24 hours) at 05/09/2018 1814 Last data filed at 05/09/2018 1612 Gross per 24 hour  Intake 240 ml  Output 650 ml  Net -410 ml   Filed Weights   05/08/18 1358  Weight: 81.4 kg   Examination: Physical Exam:  Constitutional: WN/WD, NAD and appears calm and comfortable Eyes: PERRL, lids and conjunctivae normal, sclerae anicteric  ENMT: External Ears, Nose appear normal. Grossly normal hearing.  Neck: Appears normal, supple, no cervical masses, normal ROM, no appreciable thyromegaly; no JVD Respiratory: Diminished to auscultation bilaterally, no wheezing, rales, rhonchi or crackles. Normal respiratory effort  and patient is not tachypenic. No accessory muscle use.  Cardiovascular: RRR, no murmurs / rubs / gallops. S1 and S2 auscultated. 1-2+ LE extremity edema.  Abdomen: Soft, non-tender, Distended slightly 2/2 to body habitus. No masses palpated. No appreciable hepatosplenomegaly. Bowel sounds positive.  GU: Deferred. Musculoskeletal: No clubbing / cyanosis of digits/nails. No joint deformity upper and lower extremities. Skin: No rashes but has a right foot ulcer on the plantar surface. . No induration; Warm and dry.  Neurologic: Has very dysarthric speech and presence of language difficulties and pseudobulbar affect.  Does have some right sided weakness compared to the left. Psychiatric:  Slightly impaired judgment and insight. Anxious mood and appropriate affect.   Data Reviewed: I have personally reviewed following labs and imaging studies  CBC: Recent Labs  Lab 05/08/18 1400 05/08/18 1425 05/09/18 0648  WBC 11.8*  --  11.2*  NEUTROABS 11.2*  --   --   HGB 7.7* 8.8* 7.1*  HCT 26.3* 26.0* 24.1*  MCV 92.3  --  88.9  PLT 156  --  697*   Basic Metabolic Panel: Recent Labs  Lab 05/08/18 1400 05/08/18 1425 05/09/18 0648  NA 139 140 141  K 4.0 4.1 3.9  CL 107 109 110  CO2 23  --  24  GLUCOSE 143* 138* 147*  BUN 59* 58* 61*  CREATININE 1.66* 1.50* 1.69*  CALCIUM 8.4*  --  8.3*   GFR: Estimated Creatinine Clearance: 28.5 mL/min (A) (by C-G formula based on SCr of 1.69 mg/dL (H)). Liver Function Tests: Recent Labs  Lab 05/08/18 1400  AST 42*  ALT 39  ALKPHOS 115  BILITOT 1.0  PROT 6.2*  ALBUMIN 2.1*   No results for input(s): LIPASE, AMYLASE in the last 168 hours. No results for input(s): AMMONIA in the last 168 hours. Coagulation Profile: Recent Labs  Lab 05/08/18 1400  INR 1.41   Cardiac Enzymes: No results for input(s): CKTOTAL, CKMB, CKMBINDEX, TROPONINI in the last 168 hours. BNP (last 3 results) No results for input(s): PROBNP in the last 8760 hours. HbA1C: No results for input(s): HGBA1C in the last 72 hours. CBG: Recent Labs  Lab 05/08/18 2352 05/09/18 0755 05/09/18 1327 05/09/18 1715  GLUCAP 175* 138* 198* 216*   Lipid Profile: No results for input(s): CHOL, HDL, LDLCALC, TRIG, CHOLHDL, LDLDIRECT in the last 72 hours. Thyroid Function Tests: No results for input(s): TSH, T4TOTAL, FREET4, T3FREE, THYROIDAB in the last 72 hours. Anemia Panel: No results for input(s): VITAMINB12, FOLATE, FERRITIN, TIBC, IRON, RETICCTPCT in the last 72 hours. Sepsis Labs: No results for input(s): PROCALCITON, LATICACIDVEN in the last 168 hours.  No results found for this or any previous visit (from the past 240 hour(s)).   Radiology  Studies: Ct Head Wo Contrast  Result Date: 05/08/2018 CLINICAL DATA:  83 year old female with weakness and decreased mobility. EXAM: CT HEAD WITHOUT CONTRAST TECHNIQUE: Contiguous axial images were obtained from the base of the skull through the vertex without intravenous contrast. COMPARISON:  Recent prior CT scan of the head 04/18/2018 FINDINGS: Brain: Similar appearance of left MCA territory encephalomalacia consistent with a remote infarct. Multiple additional punctate cortical infarcts visualized on the prior MRI are difficult to perceive by CT imaging. No definite new acute infarct, ischemia. No evidence of intracranial hemorrhage, mass lesion, mass effect or hydrocephalus. Stable atrophy and moderate chronic microvascular ischemic white matter disease. Vascular: No hyperdense vessel or unexpected calcification. Atherosclerotic calcifications in the bilateral cavernous and supraclinoid internal carotid arteries. Skull: Normal. Negative for  fracture or focal lesion. Sinuses/Orbits: No acute finding. Other: None. IMPRESSION: 1. No acute intracranial abnormality. 2. Stable appearance of remote left MCA territory infarct, mild cortical atrophy and moderate chronic microvascular ischemic white matter disease. Electronically Signed   By: Jacqulynn Cadet M.D.   On: 05/08/2018 15:57   Mr Brain Wo Contrast  Result Date: 05/08/2018 CLINICAL DATA:  Focal neurologic deficit with worsening right-sided weakness. EXAM: MRI HEAD WITHOUT CONTRAST TECHNIQUE: Multiplanar, multiecho pulse sequences of the brain and surrounding structures were obtained without intravenous contrast. COMPARISON:  Head CT 05/08/2018 Brain MRI 04/18/2018 FINDINGS: BRAIN: There is a small amount of diffusion restriction along the left postcentral gyrus and the superior frontal gyrus. Distribution has changed slightly from the prior study with new sites compared to 04/18/2018. The midline structures are normal. No midline shift or other mass  effect. There is encephalomalacia at the site of an old left frontal infarct. Multifocal white matter hyperintensity, most commonly due to chronic ischemic microangiopathy. The cerebral and cerebellar volume are age-appropriate. Mild hemosiderin deposition over the site of the old left frontal infarct. VASCULAR: Major intracranial arterial and venous sinus flow voids are normal. SKULL AND UPPER CERVICAL SPINE: Calvarial bone marrow signal is normal. There is no skull base mass. Visualized upper cervical spine and soft tissues are normal. SINUSES/ORBITS: No fluid levels or advanced mucosal thickening. No mastoid or middle ear effusion. The orbits are normal. IMPRESSION: 1. Multiple new foci of acute ischemia within the left hemisphere, in close proximity to the previously demonstrated lesions. 2. No hemorrhage or mass effect. 3. Old left frontal lobe infarct and chronic ischemic microangiopathy. Electronically Signed   By: Ulyses Jarred M.D.   On: 05/08/2018 22:48   Scheduled Meds: . amLODipine  10 mg Oral Daily  . apixaban  5 mg Oral BID  . atorvastatin  80 mg Oral q1800  . collagenase   Topical Daily  . ferrous sulfate  325 mg Oral Q breakfast  . furosemide  40 mg Oral Daily  . insulin aspart  0-9 Units Subcutaneous TID WC  . insulin NPH Human  10 Units Subcutaneous QHS  . insulin NPH Human  15 Units Subcutaneous QAC breakfast  . metoprolol tartrate  25 mg Oral BID  . pantoprazole  40 mg Oral Daily  . predniSONE  50 mg Oral Q breakfast   Continuous Infusions:   LOS: 1 day   Kerney Elbe, DO [facility] PAGER is on [place]  If 7PM-7AM, please contact night-coverage [email or web address] Password [facility] 05/09/2018, 6:14 PM

## 2018-05-09 NOTE — Progress Notes (Addendum)
STROKE TEAM PROGRESS NOTE   INTERVAL HISTORY Her speech therapists are at the bedside.  She is up in the chair at the bedside. MRI scan of the brain does confirm new left hemispheric embolic infarcts  Vitals:   05/08/18 2342 05/09/18 0142 05/09/18 0342 05/09/18 0804  BP: (!) 170/70 (!) 173/60 (!) 166/59 (!) 158/68  Pulse: 82 77 70 74  Resp: 13 13 17 13   Temp: 98.5 F (36.9 C)  98.7 F (37.1 C) 97.8 F (36.6 C)  TempSrc: Axillary  Oral Oral  SpO2: (!) 89% 98%  95%  Weight:      Height:        CBC:  Recent Labs  Lab 05/08/18 1400 05/08/18 1425 05/09/18 0648  WBC 11.8*  --  11.2*  NEUTROABS 11.2*  --   --   HGB 7.7* 8.8* 7.1*  HCT 26.3* 26.0* 24.1*  MCV 92.3  --  88.9  PLT 156  --  145*    Basic Metabolic Panel:  Recent Labs  Lab 05/08/18 1400 05/08/18 1425 05/09/18 0648  NA 139 140 141  K 4.0 4.1 3.9  CL 107 109 110  CO2 23  --  24  GLUCOSE 143* 138* 147*  BUN 59* 58* 61*  CREATININE 1.66* 1.50* 1.69*  CALCIUM 8.4*  --  8.3*   Lipid Panel:     Component Value Date/Time   CHOL 150 04/19/2018 0427   CHOL 111 02/07/2018 1135   TRIG 80 04/19/2018 0427   HDL 45 04/19/2018 0427   HDL 33 (L) 02/07/2018 1135   CHOLHDL 3.3 04/19/2018 0427   VLDL 16 04/19/2018 0427   LDLCALC 89 04/19/2018 0427   LDLCALC 61 02/07/2018 1135   HgbA1c:  Lab Results  Component Value Date   HGBA1C 7.2 (H) 04/15/2018   Urine Drug Screen: No results found for: LABOPIA, COCAINSCRNUR, LABBENZ, AMPHETMU, THCU, LABBARB  Alcohol Level     Component Value Date/Time   ETH <10 05/08/2018 1400    IMAGING Ct Head Wo Contrast  Result Date: 05/08/2018 CLINICAL DATA:  83 year old female with weakness and decreased mobility. EXAM: CT HEAD WITHOUT CONTRAST TECHNIQUE: Contiguous axial images were obtained from the base of the skull through the vertex without intravenous contrast. COMPARISON:  Recent prior CT scan of the head 04/18/2018 FINDINGS: Brain: Similar appearance of left MCA territory  encephalomalacia consistent with a remote infarct. Multiple additional punctate cortical infarcts visualized on the prior MRI are difficult to perceive by CT imaging. No definite new acute infarct, ischemia. No evidence of intracranial hemorrhage, mass lesion, mass effect or hydrocephalus. Stable atrophy and moderate chronic microvascular ischemic white matter disease. Vascular: No hyperdense vessel or unexpected calcification. Atherosclerotic calcifications in the bilateral cavernous and supraclinoid internal carotid arteries. Skull: Normal. Negative for fracture or focal lesion. Sinuses/Orbits: No acute finding. Other: None. IMPRESSION: 1. No acute intracranial abnormality. 2. Stable appearance of remote left MCA territory infarct, mild cortical atrophy and moderate chronic microvascular ischemic white matter disease. Electronically Signed   By: Jacqulynn Cadet M.D.   On: 05/08/2018 15:57   Mr Brain Wo Contrast  Result Date: 05/08/2018 CLINICAL DATA:  Focal neurologic deficit with worsening right-sided weakness. EXAM: MRI HEAD WITHOUT CONTRAST TECHNIQUE: Multiplanar, multiecho pulse sequences of the brain and surrounding structures were obtained without intravenous contrast. COMPARISON:  Head CT 05/08/2018 Brain MRI 04/18/2018 FINDINGS: BRAIN: There is a small amount of diffusion restriction along the left postcentral gyrus and the superior frontal gyrus. Distribution has changed slightly from  the prior study with new sites compared to 04/18/2018. The midline structures are normal. No midline shift or other mass effect. There is encephalomalacia at the site of an old left frontal infarct. Multifocal white matter hyperintensity, most commonly due to chronic ischemic microangiopathy. The cerebral and cerebellar volume are age-appropriate. Mild hemosiderin deposition over the site of the old left frontal infarct. VASCULAR: Major intracranial arterial and venous sinus flow voids are normal. SKULL AND UPPER  CERVICAL SPINE: Calvarial bone marrow signal is normal. There is no skull base mass. Visualized upper cervical spine and soft tissues are normal. SINUSES/ORBITS: No fluid levels or advanced mucosal thickening. No mastoid or middle ear effusion. The orbits are normal. IMPRESSION: 1. Multiple new foci of acute ischemia within the left hemisphere, in close proximity to the previously demonstrated lesions. 2. No hemorrhage or mass effect. 3. Old left frontal lobe infarct and chronic ischemic microangiopathy. Electronically Signed   By: Ulyses Jarred M.D.   On: 05/08/2018 22:48    PHYSICAL EXAM Pleasant elderly Caucasian lady not in distress. . Afebrile. Head is nontraumatic. Neck is supple without bruit.    Cardiac exam no murmur or gallop. Lungs are clear to auscultation. Distal pulses are well felt. Neurological Exam :  Awake alert pseudobulbar speech with significant expressive language difficulties.moderate dysarthria. Follows simple midline and one-step commands only. Left gaze preference but able to look to the right past midline. She is legally blind bilaterally and visual fields cannot be reliably tested.right lower facial weakness. Tongue midline. Right hemiparesis 3/5 with good antigravity strength on the left side. Tone is increased more on the right than the left. Deep tendon reflexes are brisk. ASSESSMENT/PLAN Kristina Summers is a 83 y.o. female with history of diabetes, TAVR, PVD, PAF, hypertension, hyperlipidemia, recent CVA 03/2018 with resultant expressive?receptive aphasia and R sided weakness, chronic kidney disease, presenting with increased aphasia and R sided weakness.   Stroke:  New left brain infarcts adjacent to previous L brain infarcts from December 1308, infarcts  embolic secondary to known AF on Eliquis  Resultant pseudobulbar speech and right hemiparesis  CT head No acute stroke. Old L MCA infarct. Small vessel disease. Atrophy.     MRI  Multiple new L brain infarcts (L post  central gyrus, superior frontal gyrus). Old L frontal infarct. Chronic microangiopathy  LDL 89  HgbA1c 7.2  Eliiquis for VTE prophylaxis  Eliquis (apixaban) daily prior to admission, now on Eliquis (apixaban) daily. Continue Eliquis at d/c  Therapy recommendations:  SNF  Disposition:  pending  No further stroke work-up needed Has follow-up appointment with Dr. Lavell Anchors at [facility] neurology on 05/25/2018 at 10:30 AM Stroke team will sign off.  Please call us for questions  Atrial Fibrillation, chronic s/p TAVR  Home anticoagulation:  Eliquis (apixaban) daily continued in the hospital . Continue Eliquis (apixaban) daily at discharge   Hypertension  Stable . Permissive hypertension (OK if < 220/120) but gradually normalize in 5-7 days . Long-term BP goal normotensive  Hyperlipidemia  Home meds:  lipitor 80, resumed in hospital  LDL 89, goal < 70  Continue statin at discharge  Diabetes type II  HgbA1c 7.2, goal < 7.0  Uncontrolled  Other Stroke Risk Factors  Advanced age  Former Cigarette smoker, quit 28 yrs ago  Overweight, Body mass index is 28.96 kg/m., recommend weight loss, diet and exercise as appropriate   Hx stroke/TIA  03/2018 - Mult small scattered left MCA infarcts c/w L M2/M3 occlusion, infarcts embolic secondary to known  atrial fibrillation on low dose eliquis (2.5 bid) ? 05/2014 L MCA d/t AF started on [facility]  Coronary artery disease status post CABG   History of carotid disease,   Chronic systolic CHF  PVD  TAVR  Other Active Problems  CKD stage 4  Chronically elevated troponin  Chronic normocytic anemia  Chronic R foot ulcer  Temporal arteritis (presumed, biopsy negative but improvement on prednisone)  Fibromyalgia   GERD  Hospital day # [place], MSN, APRN, ANVP-BC, AGPCNP-BC Advanced Practice Stroke Nurse [place] for Schedule & Pager information 05/09/2018 10:25 AM  I have personally  obtained history,examined this patient, reviewed notes, independently viewed imaging studies, participated in medical decision making and plan of care.ROS completed by me personally and pertinent positives fully documented  I have made any additions or clarifications directly to the above note. Agree with note above.  She presented with worsening of speech and right-sided weakness secondary to new embolic left brain infarcts from atrial fibrillation despite anticoagulation with eliquis. Unfortunately there is no data suggesting any benefit of switching to an alternative anticoagulant or adding aspirin hence recommend continue eliquis. Therapy consults. No family available at bedside. Discussed with Dr. Marinus Maw.I have spent a total of   35 minutes with the patient reviewing hospital notes,  test results, labs and examining the patient as well as establishing an assessment and plan that was discussed personally with the patient.  > 50% of time was spent in direct patient care.      Kristina Contras, MD Medical Director [facility] Pager: [phone] 05/09/2018 3:22 PM  To contact Stroke Continuity provider, please refer to [email or web address] After hours, contact General Neurology

## 2018-05-09 NOTE — Consult Note (Signed)
[place] Nurse wound consult note Reason for Consult:Neuropathic full thickness ulceration on the right foot, lateral aspect at 5th digit Wound type:Neuropathic Pressure Injury POA: N/A Measurement:1cm x 2cm x 0.4cm Wound bed: 80% red, 20% yellow  Drainage (amount, consistency, odor) small amount serous to light yellow exudate on old dressing Periwound:intact, dry Dressing procedure/placement/frequency: I will implement a POC using collagenase (Santyl) enzymatic debriding agent to clean wound base and promote granulation. MRI was not performed last evening.  A Prevalon boot is provided to the right foot to correct lateral rotation of foot. A pressure redistribution chair cushion is provided for her chair and a sacral prophylactic foam dressing is requested.  [place] nursing team will not follow, but will remain available to this patient, the nursing and medical teams.  Please re-consult if needed. Thanks, Maudie Flakes, MSN, RN, [place], Arther Abbott  Pager# [phone]

## 2018-05-09 NOTE — Progress Notes (Signed)
Received report from [place]. I took over care for the patient at this time.

## 2018-05-09 NOTE — Evaluation (Signed)
Speech Language Pathology Evaluation Patient Details Name: Kristina Summers MRN: [id number] DOB: [birth year] Today's Date: 05/09/2018 Time: 7628-3151 SLP Time Calculation (min) (ACUTE ONLY): 43 min  Problem List:  Patient Active Problem List   Diagnosis Date Noted  . Right sided weakness 05/08/2018  . Acute ischemic stroke ([place])   . Labile blood glucose   . Benign essential HTN   . Leukocytosis   . CVA (cerebral vascular accident) ([place]) 04/18/2018  . CHF exacerbation ([place]) 04/10/2018  . Acute exacerbation of congestive heart failure ([place]) 03/20/2018  . Gastrointestinal hemorrhage   . Acute on chronic systolic CHF (congestive heart failure) ([place]) 03/13/2018  . Decreased hearing 02/03/2018  . History of myocardial infarction 02/03/2018  . Methicillin resistant Staphylococcus aureus infection 02/03/2018  . Acute blood loss anemia 12/22/2017  . MRSA bacteremia 12/12/2017  . CKD (chronic kidney disease) stage 4, GFR 15-29 ml/min (HCC) 12/11/2017  . Foot ulcer, right ([place]) 12/11/2017  . 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 Severe aortic stenosis 10/11/2017  . Hypochromic anemia 08/25/2017  . Essential hypertension 06/10/2017  . Type II diabetes mellitus with renal manifestations ([place]) 06/10/2017  . Acute on chronic congestive heart failure ([place]) 01/28/2017  . Fall   . Fracture of distal end of right fibula 05/29/2014  . History of CVA (cerebrovascular accident) 05/24/2014  . Peripheral vascular disease, unspecified ([place]) 01/11/2014  . CAD (coronary artery disease)   . Atrial fibrillation, chronic   . Diabetes mellitus type 2 with peripheral artery disease ([place])   . Hyperlipidemia   . History of GI bleed 04/21/2009  . S/P CABG (coronary artery bypass graft) 11/20/1998   Past Medical History:  Past Medical History:  Diagnosis Date  . Anemia   . CAD (coronary artery disease)    a. s/p CABG in 2000  . Carotid artery occlusion   . CKD (chronic kidney  disease)   . Diastolic CHF ([place]) 76/1607  . Fibromyalgia   . GERD (gastroesophageal reflux disease)   . History of CVA (cerebrovascular accident) 05/24/2014  . History of GI bleed   . Hyperlipidemia   . Hypertension   . Obesity   . PAF (paroxysmal atrial fibrillation) (HCC)    a. on Eliquis  . Peripheral vascular disease ([place])   . 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 Type II diabetes mellitus ([place]) dx'd 1979   Past Surgical History:  Past Surgical History:  Procedure Laterality Date  . ABDOMINAL AORTAGRAM N/A 04/01/2011   Procedure: ABDOMINAL AORTAGRAM;  Surgeon: Conrad Vermillion, MD;  Location: [facility] CATH LAB;  Service: Cardiovascular;  Laterality: N/A;  . ANGIOPLASTY  06/17/11   Left leg common femoral artery cannulation under u/s Left leg runoff  . ARTERY BIOPSY Right 04/13/2018   Procedure: BIOPSY TEMPORAL ARTERY RIGHT;  Surgeon: Rosetta Posner, MD;  Location: [place];  Service: Vascular;  Laterality: Right;  . CARDIAC CATHETERIZATION  10/11/2017  . CARPAL TUNNEL RELEASE Right   . CATARACT EXTRACTION W/ INTRAOCULAR LENS  IMPLANT, BILATERAL  2004-2005  . CORONARY ARTERY BYPASS GRAFT  2000   CABG X5  . ESOPHAGOGASTRODUODENOSCOPY (EGD) WITH PROPOFOL N/A 12/25/2017   Procedure: ESOPHAGOGASTRODUODENOSCOPY (EGD) WITH PROPOFOL;  Surgeon: Wonda Horner, MD;  Location: [facility] ENDOSCOPY;  Service: Endoscopy;  Laterality: N/A;  . EYE SURGERY Left    "lasered before cataract OR"  . GIVENS CAPSULE STUDY  03/14/2018  .  GIVENS CAPSULE STUDY N/A 03/14/2018   Procedure: GIVENS CAPSULE STUDY;  Surgeon: Ronald Lobo, MD;  Location: [place];  Service: Endoscopy;  Laterality: N/A;  . LOWER EXTREMITY ANGIOGRAM Bilateral 04/01/2011   Procedure: LOWER EXTREMITY ANGIOGRAM;  Surgeon: Conrad [place], MD;  Location: [facility] CATH LAB;  Service: Cardiovascular;  Laterality: Bilateral;  .  LOWER EXTREMITY ANGIOGRAM Left 06/17/2011   Procedure: LOWER EXTREMITY ANGIOGRAM;  Surgeon: Conrad Reno, MD;  Location: [facility] CATH LAB;  Service: Cardiovascular;  Laterality: Left;  . LOWER EXTREMITY ANGIOGRAM N/A 11/18/2011   Procedure: LOWER EXTREMITY ANGIOGRAM;  Surgeon: Conrad Morral, MD;  Location: [facility] CATH LAB;  Service: Cardiovascular;  Laterality: N/A;  . PERCUTANEOUS STENT INTERVENTION Right 04/01/2011   Procedure: PERCUTANEOUS STENT INTERVENTION;  Surgeon: Conrad , MD;  Location: [facility] CATH LAB;  Service: Cardiovascular;  Laterality: Right;  rt iliac stent  . PERIPHERAL ARTERIAL STENT GRAFT  04/01/11   right common iliac  . RIGHT/LEFT HEART CATH AND CORONARY/GRAFT ANGIOGRAPHY N/A 10/11/2017   Procedure: RIGHT/LEFT HEART CATH AND CORONARY/GRAFT ANGIOGRAPHY;  Surgeon: Sherren Mocha, MD;  Location: [place] CV LAB;  Service: Cardiovascular;  Laterality: N/A;  . TEE WITHOUT CARDIOVERSION N/A 11/08/2017   Procedure: TRANSESOPHAGEAL ECHOCARDIOGRAM (TEE);  Surgeon: Sherren Mocha, MD;  Location: [place];  Service: Open Heart Surgery;  Laterality: N/A;  . TEE WITHOUT CARDIOVERSION N/A 12/15/2017   Procedure: TRANSESOPHAGEAL ECHOCARDIOGRAM (TEE);  Surgeon: Larey Dresser, MD;  Location: [facility] ENDOSCOPY;  Service: Cardiovascular;  Laterality: N/A;  . TRANSCATHETER AORTIC VALVE REPLACEMENT, TRANSFEMORAL N/A 11/08/2017   Procedure: TRANSCATHETER AORTIC VALVE REPLACEMENT, TRANSFEMORAL;  Surgeon: Sherren Mocha, MD;  Location: [place];  Service: Open Heart Surgery;  Laterality: N/A;  . TRIGGER FINGER RELEASE Left 1996   thumb   HPI:  Pt is a 83 y.o. female who presented with worsening right-sided weakness. She was recently evaluated here for aphasia as well as right leg weakness and found to have multiple left MCA infarcts with L M2/M3 occlusion, infarcts embolic secondary to known A fib. The most recent MRI of 1/20 revealed multiple new foci of acute ischemia within the left hemisphere, in close proximity  to the previously demonstrated lesions. Old left frontal lobe infarct and chronic ischemic microangiopathy were also noted.   Assessment / Plan / Recommendation Clinical Impression  Pt was seen in her room for speech/language evaluation. She was unable to provide history due to aphasia and no family was present. Oral mechanism exam revealed reduced labial and lingual range of motion and strength. Today's assessment revealed moderate non-fluent aphasia which may be worse compared to baseline. She was able to follow 2-step commands but exhibited difficulty with 3-step commands and paragraph-level material. Sentence repetition was moderate-severely impaired but she accurately demonstrated responsive naming and sentence completion. Difficulty was also noted with orientation and memory; however, the impact of aphasia on her performance with cognitive tasks is considered. Phonemic and neologistic paraphasias were noted throughout the evaluation with consistent awareness of deficits and attempts to correct errors. With regards to speech, she exhibited motor speech impairments characterized by imprecise articulation, inconsistent error patterns, and groping movements during attempts at speech production which suggest the presence of both dysarthria and apraxia. Skilled SLP services are clinically indicated at this time to improve aphasia and motor speech impairments.     SLP Assessment  SLP Recommendation/Assessment: Patient needs continued Speech Lanaguage Pathology Services SLP Visit Diagnosis: Apraxia (R48.2);Aphasia (R47.01);Dysarthria and anarthria (R47.1)    Follow Up Recommendations  Skilled  Nursing facility    Frequency and Duration min 2x/week  2 weeks      SLP Evaluation Cognition  Overall Cognitive Status: No family/caregiver present to determine baseline cognitive functioning Arousal/Alertness: Awake/alert Orientation Level: Oriented to person;Oriented to place;Disoriented to time(Pt was  able to provide month and day but not date) Memory: Impaired Memory Impairment: Retrieval deficit Awareness: Appears intact       Comprehension  Auditory Comprehension Overall Auditory Comprehension: Impaired Yes/No Questions: Within Functional Limits Commands: Impaired One Step Basic Commands: 75-100% accurate Two Step Basic Commands: 25-49% accurate Conversation: Simple Interfering Components: Processing speed EffectiveTechniques: Slowed speech;Repetition;Pausing;Extra processing time;Stressing words Reading Comprehension Reading Status: Not tested(Unable to assess due to pt being legally blind. )    Expression Expression Primary Mode of Expression: Verbal Verbal Expression Overall Verbal Expression: Impaired Initiation: Impaired Automatic Speech: Name;Counting;Day of week;Month of year(Months impaired ) Level of Generative/Spontaneous Verbalization: Phrase Repetition: Impaired Level of Impairment: Sentence level Naming: Impairment Confrontation: Not tested(Pt unable to complete tasks due to legal blindness) Verbal Errors: Neologisms;Phonemic paraphasias;Aware of errors Pragmatics: No impairment Interfering Components: Premorbid deficit;Speech intelligibility Effective Techniques: Semantic cues;Sentence completion Written Expression Dominant Hand: Right   Oral / Motor  Oral Motor/Sensory Function Overall Oral Motor/Sensory Function: Within functional limits Motor Speech Overall Motor Speech: Impaired Respiration: Within functional limits Phonation: Normal Resonance: Within functional limits Articulation: Impaired Level of Impairment: Phrase Intelligibility: Intelligibility reduced Word: 25-49% accurate Phrase: 25-49% accurate Sentence: 25-49% accurate Conversation: 25-49% accurate Motor Planning: Impaired Level of Impairment: Phrase Motor Speech Errors: Aware Interfering Components: Premorbid status Effective Techniques: Slow rate   Brionna Romanek I. Hardin Negus, [place],  [place] Office number [phone] Pager [place] 05/09/2018, 1:04 PM

## 2018-05-09 NOTE — Evaluation (Signed)
Occupational Therapy Evaluation Patient Details Name: Kristina Summers MRN: [id number] DOB: June 19, 1935 Today's Date: 05/09/2018    History of Present Illness Kristina Summers is a 83 y.o. female with medical history significant of DM, PVD, A Fib on Eliquis, S/p TAVR 11/08/2017, HTN, HLD, diastolic HF, CAD s/p CABG, CKD who presents with worsening right-sided weakness and aphasia. Recent admission with multiple L MCA infarcts, R sided weakeness but aphasia resolved per family. CT scan stable, MRI pending.    Clinical Impression   PTA patient at SNF recovering from L MCA in Dec 2019.  Admitted for above and limited by problem list below, including R sided weakness and inattention, aphasia, impaired balance, decreased cognition.  Patient requires max assist for UB ADLs, mod assist for grooming, total assist for LB ADLs and max assist +2 for transfers.  Patient will benefit from continued OT services while admitted and after dc at SNF level in order to optimize independence and safety with ADLs/mobility.     Follow Up Recommendations  SNF    Equipment Recommendations  Other (comment)(TBD at next venue of care)    Recommendations for Other Services       Precautions / Restrictions Precautions Precautions: Fall Precaution Comments: severe aphasia, legally blind Restrictions Weight Bearing Restrictions: No      Mobility Bed Mobility Overal bed mobility: Needs Assistance Bed Mobility: Rolling;Sidelying to Sit Rolling: Mod assist Sidelying to sit: Max assist       General bed mobility comments: increased time and effort, cueing for sequencing and problem sovling; max assist for R LE and trunk support to ascend  Transfers Overall transfer level: Needs assistance Equipment used: None;Rolling walker (2 wheeled) Transfers: Sit to/from [employer] Sit to Stand: Max assist;+2 physical assistance;+2 safety/equipment;From elevated surface   Squat pivot transfers: Max assist;+2  physical assistance;+2 safety/equipment     General transfer comment: sit to stand with RW, max assist +2 to ascend into standing with poor walker control; removed walker and completed squat pivot towards recliner on L side with max assist +2 given cueing for sequening and safety     Balance Overall balance assessment: Needs assistance Sitting-balance support: Feet supported;No upper extremity supported Sitting balance-Leahy Scale: Poor Sitting balance - Comments: min guard for safety, R lateral lean with fatigue   Standing balance support: Bilateral upper extremity supported;During functional activity Standing balance-Leahy Scale: Poor Standing balance comment: reliant on B UE and external support                            ADL either performed or assessed with clinical judgement   ADL Overall ADL's : Needs assistance/impaired     Grooming: Minimal assistance;Sitting;Wash/dry hands Grooming Details (indicate cue type and reason): able to wash hands, engaging R UE functionally but assist as moderate drop rate of wash cloth and cueing for attention to R hand Upper Body Bathing: Moderate assistance;Sitting   Lower Body Bathing: Maximal assistance;+2 for physical assistance;Sit to/from stand   Upper Body Dressing : Maximal assistance;Sitting   Lower Body Dressing: Total assistance;Sit to/from stand   Toilet Transfer: Maximal assistance;+2 for physical assistance;+2 for safety/equipment;Ambulation;Squat-pivot;Cueing for safety;Cueing for sequencing Toilet Transfer Details (indicate cue type and reason): simulated to recliner          Functional mobility during ADLs: Maximal assistance;+2 for physical assistance;+2 for safety/equipment General ADL Comments: limited by impaired balance, R inattention and weakness      Vision   Additional Comments:  legally blind, able to see shadows only      Perception     Praxis      Pertinent Vitals/Pain Pain Assessment:  No/denies pain     Hand Dominance Right   Extremity/Trunk Assessment Upper Extremity Assessment Upper Extremity Assessment: RUE deficits/detail RUE Deficits / Details: inattention with fluctuating functional use, grasp 3-/5, grossly 3-/5 shoulder, eblow  RUE Sensation: decreased light touch;decreased proprioception RUE Coordination: decreased fine motor;decreased gross motor   Lower Extremity Assessment Lower Extremity Assessment: Defer to PT evaluation       Communication Communication Communication: Expressive difficulties   Cognition Arousal/Alertness: Awake/alert Behavior During Therapy: WFL for tasks assessed/performed Overall Cognitive Status: No family/caregiver present to determine baseline cognitive functioning Area of Impairment: Attention;Following commands;Safety/judgement;Problem solving                   Current Attention Level: Selective   Following Commands: Follows one step commands consistently;Follows one step commands with increased time Safety/Judgement: Decreased awareness of safety;Decreased awareness of deficits   Problem Solving: Difficulty sequencing;Decreased initiation;Requires verbal cues;Requires tactile cues;Slow processing General Comments: R inattention, decreased initation and sequencing during self care and functional mobility tasks; cueing for problem solving    General Comments       Exercises     Shoulder Instructions      Home Living Family/patient expects to be discharged to:: Skilled nursing facility                                        Prior Functioning/Environment Level of Independence: Needs assistance  Gait / Transfers Assistance Needed: using RW with assist at SNF  ADL's / Homemaking Assistance Needed: reports independent with ADLs, but anticipate a little assist needed   Comments: impaired vision         OT Problem List: Decreased strength;Decreased activity tolerance;Decreased knowledge of  use of DME or AE;Impaired balance (sitting and/or standing);Impaired vision/perception;Impaired sensation;Obesity;Impaired UE functional use;Increased edema;Decreased coordination      OT Treatment/Interventions: Self-care/ADL training;Therapeutic exercise;Therapeutic activities;Patient/family education;Energy conservation;DME and/or AE instruction    OT Goals(Current goals can be found in the care plan section) Acute Rehab OT Goals Patient Stated Goal: get therapy to get better OT Goal Formulation: With patient Time For Goal Achievement: 05/23/18 Potential to Achieve Goals: Good  OT Frequency: Min 2X/week   Barriers to D/C:            Co-evaluation PT/OT/SLP Co-Evaluation/Treatment: Yes Reason for Co-Treatment: Complexity of the patient's impairments (multi-system involvement)   OT goals addressed during session: ADL's and self-care      AM-PAC OT "6 Clicks" Daily Activity     Outcome Measure Help from another person eating meals?: A Little Help from another person taking care of personal grooming?: A Lot Help from another person toileting, which includes using toliet, bedpan, or urinal?: Total Help from another person bathing (including washing, rinsing, drying)?: A Lot Help from another person to put on and taking off regular upper body clothing?: A Lot Help from another person to put on and taking off regular lower body clothing?: Total 6 Click Score: 11   End of Session Equipment Utilized During Treatment: Gait belt Nurse Communication: Mobility status  Activity Tolerance: Patient tolerated treatment well Patient left: with call bell/phone within reach;in chair;with chair alarm set  OT Visit Diagnosis: Unsteadiness on feet (R26.81);Other abnormalities of gait and mobility (R26.89);Muscle weakness (generalized) (M62.81);Hemiplegia  and hemiparesis;Cognitive communication deficit (R41.841) Symptoms and signs involving cognitive functions: Cerebral infarction Hemiplegia -  Right/Left: Right Hemiplegia - dominant/non-dominant: Dominant Hemiplegia - caused by: Cerebral infarction                Time: 9622-2979 OT Time Calculation (min): 31 min Charges:  OT General Charges $OT Visit: 1 Visit OT Evaluation $OT Eval Moderate Complexity: 1 Mod  Delight Stare, OT Acute Rehabilitation Services Pager [phone] Office [phone]   Delight Stare 05/09/2018, 9:17 AM

## 2018-05-09 NOTE — Evaluation (Signed)
Physical Therapy Evaluation Patient Details Name: Kristina Summers MRN: [id number] DOB: [birth year] Today's Date: 05/09/2018   History of Present Illness  Kristina Summers is a 83 y.o. female with medical history significant of DM, PVD, A Fib on Eliquis, S/p TAVR 11/08/2017, HTN, HLD, diastolic HF, CAD s/p CABG, CKD who presents with worsening right-sided weakness and aphasia. Recent admission with multiple L MCA infarcts, R sided weakeness but aphasia resolved per family. CT scan stable, MRI pending.   Clinical Impression  Patient presents with right hemiparesis, right inattention, baseline aphasia (not sure if this is worse), decreased sensation and impaired mobility s/p above. Pt legally blind and very HOH. Requires Max A for bed mobility and for standing. Able to perform squat pivot transfer to chair with Max A of 2. Difficulty advancing RLE during transfer. Able to verbalize some but difficult to understand at times. Difficulty with problem solving and attention. Pt from SNF due to recent CVA. Reports walking with RW at SNF. Would benefit from return to SNF to maximize independence and mobility prior to return to SNF.     Follow Up Recommendations SNF;Supervision/Assistance - 24 hour    Equipment Recommendations  None recommended by PT    Recommendations for Other Services       Precautions / Restrictions Precautions Precautions: Fall Precaution Comments: severe aphasia, legally blind, HOH Restrictions Weight Bearing Restrictions: No      Mobility  Bed Mobility Overal bed mobility: Needs Assistance Bed Mobility: Rolling;Sidelying to Sit Rolling: Mod assist Sidelying to sit: Max assist;HOB elevated       General bed mobility comments: increased time and effort, cueing for sequencing and problem sovling; max assist for R LE and trunk support to ascend  Transfers Overall transfer level: Needs assistance Equipment used: Rolling walker (2 wheeled) Transfers: Sit to/from [employer] Sit to Stand: Max assist;+2 physical assistance;+2 safety/equipment;From elevated surface   Squat pivot transfers: Max assist;+2 physical assistance;+2 safety/equipment     General transfer comment: sit to stand with RW, max assist +2 to ascend into standing with poor walker control; removed walker and completed squat pivot towards recliner on L side with max assist +2 given cueing for sequening and safety   Ambulation/Gait             General Gait Details: Deferred.   Stairs            Wheelchair Mobility    Modified Rankin (Stroke Patients Only) Modified Rankin (Stroke Patients Only) Pre-Morbid Rankin Score: Moderately severe disability Modified Rankin: Moderately severe disability     Balance Overall balance assessment: Needs assistance Sitting-balance support: Feet supported;No upper extremity supported Sitting balance-Leahy Scale: Poor Sitting balance - Comments: min guard for safety, Rt lateral lean with fatigue. Able to sit statically with UE support.   Standing balance support: Bilateral upper extremity supported;During functional activity Standing balance-Leahy Scale: Poor Standing balance comment: reliant on B UE and external support                              Pertinent Vitals/Pain Pain Assessment: No/denies pain    Home Living Family/patient expects to be discharged to:: Skilled nursing facility                      Prior Function Level of Independence: Needs assistance   Gait / Transfers Assistance Needed: using RW with assist at SNF   ADL's / Homemaking Assistance Needed:  reports independent with ADLs, but anticipate a little assist needed  Comments: impaired vision      Hand Dominance   Dominant Hand: Right    Extremity/Trunk Assessment   Upper Extremity Assessment Upper Extremity Assessment: Defer to OT evaluation RUE Deficits / Details: inattention with fluctuating functional use, grasp 3-/5, grossly  3-/5 shoulder, eblow  RUE Sensation: decreased light touch;decreased proprioception RUE Coordination: decreased fine motor;decreased gross motor    Lower Extremity Assessment Lower Extremity Assessment: RLE deficits/detail RLE Deficits / Details: No active movement noted with MMT but able to stand without knee buckling.  RLE Sensation: decreased light touch;decreased proprioception RLE Coordination: decreased fine motor;decreased gross motor       Communication   Communication: Expressive difficulties;Receptive difficulties  Cognition Arousal/Alertness: Awake/alert Behavior During Therapy: WFL for tasks assessed/performed Overall Cognitive Status: No family/caregiver present to determine baseline cognitive functioning Area of Impairment: Attention;Following commands;Safety/judgement;Problem solving                   Current Attention Level: Selective   Following Commands: Follows one step commands consistently;Follows one step commands with increased time Safety/Judgement: Decreased awareness of safety;Decreased awareness of deficits   Problem Solving: Difficulty sequencing;Decreased initiation;Requires verbal cues;Requires tactile cues;Slow processing General Comments: Rt inattention, decreased initation and sequencing during self care and functional mobility tasks; cueing for problem solving. Trying to verbalize- difficult to comprehend at times.       General Comments General comments (skin integrity, edema, etc.): Sp02 remained >89% on RA throughout. Pt's IV site saturated with blood. RN aware.     Exercises     Assessment/Plan    PT Assessment Patient needs continued PT services  PT Problem List Decreased strength;Decreased activity tolerance;Decreased balance;Decreased mobility;Cardiopulmonary status limiting activity;Decreased knowledge of use of DME;Decreased safety awareness;Decreased coordination;Decreased range of motion;Impaired sensation       PT  Treatment Interventions Gait training;Stair training;Functional mobility training;Therapeutic activities;DME instruction;Therapeutic exercise;Balance training;Patient/family education;Neuromuscular re-education    PT Goals (Current goals can be found in the Care Plan section)  Acute Rehab PT Goals Patient Stated Goal: get therapy to get better PT Goal Formulation: With patient Time For Goal Achievement: 05/23/18 Potential to Achieve Goals: Good    Frequency Min 3X/week   Barriers to discharge        Co-evaluation PT/OT/SLP Co-Evaluation/Treatment: Yes Reason for Co-Treatment: Complexity of the patient's impairments (multi-system involvement);Necessary to address cognition/behavior during functional activity PT goals addressed during session: Mobility/safety with mobility;Balance OT goals addressed during session: ADL's and self-care       AM-PAC PT "6 Clicks" Mobility  Outcome Measure Help needed turning from your back to your side while in a flat bed without using bedrails?: A Lot Help needed moving from lying on your back to sitting on the side of a flat bed without using bedrails?: A Lot Help needed moving to and from a bed to a chair (including a wheelchair)?: A Lot Help needed standing up from a chair using your arms (e.g., wheelchair or bedside chair)?: A Lot Help needed to walk in hospital room?: Total Help needed climbing 3-5 steps with a railing? : Total 6 Click Score: 10    End of Session Equipment Utilized During Treatment: Gait belt Activity Tolerance: Patient tolerated treatment well Patient left: in chair;with call bell/phone within reach;with chair alarm set Nurse Communication: Mobility status PT Visit Diagnosis: Muscle weakness (generalized) (M62.81);Hemiplegia and hemiparesis Hemiplegia - Right/Left: Right Hemiplegia - dominant/non-dominant: Dominant Hemiplegia - caused by: Unspecified    Time:  0459-9774 PT Time Calculation (min) (ACUTE ONLY): 35  min   Charges:   PT Evaluation $PT Eval Moderate Complexity: 1 Mod          Wray Kearns, PT, DPT Acute Rehabilitation Services Pager [phone] Office [phone]      Kristina Summers 05/09/2018, 9:30 AM

## 2018-05-10 ENCOUNTER — Ambulatory Visit (HOSPITAL_COMMUNITY): Payer: Medicare Other

## 2018-05-10 DIAGNOSIS — I6310 Cerebral infarction due to embolism of unspecified precerebral artery: Secondary | ICD-10-CM

## 2018-05-10 LAB — GLUCOSE, CAPILLARY
Glucose-Capillary: 224 mg/dL — ABNORMAL HIGH (ref 70–99)
Glucose-Capillary: 238 mg/dL — ABNORMAL HIGH (ref 70–99)
Glucose-Capillary: 357 mg/dL — ABNORMAL HIGH (ref 70–99)
Glucose-Capillary: 341 mg/dL — ABNORMAL HIGH (ref 70–99)

## 2018-05-10 LAB — PREPARE RBC (CROSSMATCH)

## 2018-05-10 LAB — CBC
HCT: 23.4 % — ABNORMAL LOW (ref 36.0–46.0)
HCT: 27.3 % — ABNORMAL LOW (ref 36.0–46.0)
Hemoglobin: 6.9 g/dL — CL (ref 12.0–15.0)
Hemoglobin: 8.2 g/dL — ABNORMAL LOW (ref 12.0–15.0)
MCH: 26.1 pg (ref 26.0–34.0)
MCH: 26.7 pg (ref 26.0–34.0)
MCHC: 29.5 g/dL — ABNORMAL LOW (ref 30.0–36.0)
MCHC: 30 g/dL (ref 30.0–36.0)
MCV: 88.6 fL (ref 80.0–100.0)
MCV: 88.9 fL (ref 80.0–100.0)
PLATELETS: 144 10*3/uL — AB (ref 150–400)
Platelets: 152 10*3/uL (ref 150–400)
RBC: 2.64 MIL/uL — ABNORMAL LOW (ref 3.87–5.11)
RBC: 3.07 MIL/uL — AB (ref 3.87–5.11)
RDW: 22.3 % — AB (ref 11.5–15.5)
RDW: 20.7 % — ABNORMAL HIGH (ref 11.5–15.5)
WBC: 13.3 10*3/uL — ABNORMAL HIGH (ref 4.0–10.5)
WBC: 12 10*3/uL — ABNORMAL HIGH (ref 4.0–10.5)
nRBC: 0 % (ref 0.0–0.2)
nRBC: 0 % (ref 0.0–0.2)

## 2018-05-10 MED ORDER — INSULIN ASPART 100 UNIT/ML ~~LOC~~ SOLN
0.0000 [IU] | Freq: Every day | SUBCUTANEOUS | Status: DC
  Administered 2018-05-10: 4 [IU] via SUBCUTANEOUS

## 2018-05-10 MED ORDER — SODIUM CHLORIDE 0.9% IV SOLUTION
Freq: Once | INTRAVENOUS | Status: AC
  Administered 2018-05-10: 13:00:00 via INTRAVENOUS

## 2018-05-10 MED ORDER — APIXABAN 2.5 MG PO TABS
2.5000 mg | ORAL_TABLET | Freq: Two times a day (BID) | ORAL | Status: DC
  Administered 2018-05-10 – 2018-05-11 (×2): 2.5 mg via ORAL
  Filled 2018-05-10 (×2): qty 1

## 2018-05-10 MED ORDER — INSULIN ASPART 100 UNIT/ML ~~LOC~~ SOLN
0.0000 [IU] | Freq: Three times a day (TID) | SUBCUTANEOUS | Status: DC
  Administered 2018-05-11: 8 [IU] via SUBCUTANEOUS
  Administered 2018-05-11: 5 [IU] via SUBCUTANEOUS

## 2018-05-10 NOTE — Progress Notes (Signed)
Physical Therapy Treatment Patient Details Name: Kristina Summers MRN: [id number] DOB: [birth year] Today's Date: 05/10/2018    History of Present Illness Kristina Summers is a 83 y.o. female with medical history significant of DM, PVD, A Fib on Eliquis, S/p TAVR 11/08/2017, HTN, HLD, diastolic HF, CAD s/p CABG, CKD who presents with worsening right-sided weakness and aphasia. Recent admission with multiple L MCA infarcts, R sided weakeness but aphasia resolved per family. CT scan stable, MRI pending.     PT Comments    Session focused on static siting and standing balance EOB. Patient with cont RLE weakness prohibiting her from ambulation or side stepping without further assistance. Currently able to sit EOB with supervision today, hands on support in standing. Cont to rec post acute rehab    Follow Up Recommendations  SNF;Supervision/Assistance - 24 hour     Equipment Recommendations  None recommended by PT    Recommendations for Other Services       Precautions / Restrictions Precautions Precautions: Fall Restrictions Weight Bearing Restrictions: No    Mobility  Bed Mobility Overal bed mobility: Needs Assistance Bed Mobility: Rolling;Sidelying to Sit Rolling: Mod assist Sidelying to sit: Max assist;HOB elevated       General bed mobility comments: max A to come to sitting, RLE weakness most limiting.   Transfers Overall transfer level: Needs assistance Equipment used: 1 person hand held assist Transfers: Sit to/from Stand Sit to Stand: Max assist         General transfer comment: Max A to come to standing, unable to assist with RLE side stepping along bed. needs +2 assistance with RLE. holding onto to therapist with L hand and bearign most weight on her LLE at this time. min A for stability once standing.  Ambulation/Gait             General Gait Details: Deferred.    Stairs             Wheelchair Mobility    Modified Rankin (Stroke Patients  Only) Modified Rankin (Stroke Patients Only) Pre-Morbid Rankin Score: Moderately severe disability Modified Rankin: Moderately severe disability     Balance Overall balance assessment: Needs assistance Sitting-balance support: Feet supported;No upper extremity supported Sitting balance-Leahy Scale: Poor Sitting balance - Comments: min guard for safety, Rt lateral lean with fatigue. Able to sit statically with UE support.     Standing balance-Leahy Scale: Poor Standing balance comment: reliant on B UE and external support                             Cognition Arousal/Alertness: Awake/alert Behavior During Therapy: WFL for tasks assessed/performed Overall Cognitive Status: No family/caregiver present to determine baseline cognitive functioning Area of Impairment: Attention;Following commands;Safety/judgement;Problem solving                   Current Attention Level: Selective   Following Commands: Follows one step commands consistently;Follows one step commands with increased time Safety/Judgement: Decreased awareness of safety;Decreased awareness of deficits   Problem Solving: Difficulty sequencing;Decreased initiation;Requires verbal cues;Requires tactile cues;Slow processing General Comments: follwing simple 1 step commands, expressive deficits noted, at times answering approprioately yes      Exercises      General Comments        Pertinent Vitals/Pain Faces Pain Scale: No hurt    Home Living  Prior Function            PT Goals (current goals can now be found in the care plan section) Acute Rehab PT Goals Patient Stated Goal: get therapy to get better PT Goal Formulation: With patient Time For Goal Achievement: 05/23/18 Potential to Achieve Goals: Good Progress towards PT goals: Progressing toward goals    Frequency    Min 3X/week      PT Plan Current plan remains appropriate    Co-evaluation               AM-PAC PT "6 Clicks" Mobility   Outcome Measure  Help needed turning from your back to your side while in a flat bed without using bedrails?: A Lot Help needed moving from lying on your back to sitting on the side of a flat bed without using bedrails?: A Lot Help needed moving to and from a bed to a chair (including a wheelchair)?: A Lot Help needed standing up from a chair using your arms (e.g., wheelchair or bedside chair)?: A Lot Help needed to walk in hospital room?: Total Help needed climbing 3-5 steps with a railing? : Total 6 Click Score: 10    End of Session Equipment Utilized During Treatment: Gait belt Activity Tolerance: Patient tolerated treatment well Patient left: in bed;with call bell/phone within reach;with bed alarm set Nurse Communication: Mobility status PT Visit Diagnosis: Muscle weakness (generalized) (M62.81);Hemiplegia and hemiparesis Hemiplegia - Right/Left: Right Hemiplegia - dominant/non-dominant: Dominant Hemiplegia - caused by: Unspecified     Time: 1450-1510 PT Time Calculation (min) (ACUTE ONLY): 20 min  Charges:  $Therapeutic Activity: 8-22 mins                     Reinaldo Berber, PT, DPT Acute Rehabilitation Services Pager: [phone] Office: [phone]    Reinaldo Berber 05/10/2018, 3:12 PM

## 2018-05-10 NOTE — Progress Notes (Signed)
  Speech Language Pathology Treatment: Cognitive-Linquistic  Patient Details Name: Kristina Summers MRN: [id number] DOB: [birth year] Today's Date: 05/10/2018 Time: 8309-4076 SLP Time Calculation (min) (ACUTE ONLY): 24 min  Assessment / Plan / Recommendation Clinical Impression  Pt was seen in her room for speech and language treatment. She was alert and cooperative throughout the session and participated well despite periods of frustration. She required moderate to maximum verbal prompts to complete automatic sequencing and demonstrated 50% accuracy with responsive naming increasing to 100% accuracy with phonemic cues. She was able to produce words with increasing length when moderate-maximum cues were provided. SLP will continue to follow pt.    HPI HPI: Pt is a 83 y.o. female who presented with worsening right-sided weakness. She was recently evaluated here for aphasia as well as right leg weakness and found to have multiple left MCA infarcts with L M2/M3 occlusion, infarcts embolic secondary to known A fib. The most recent MRI of 1/20 revealed multiple new foci of acute ischemia within the left hemisphere, in close proximity to the previously demonstrated lesions. Old left frontal lobe infarct and chronic ischemic microangiopathy were also noted.      SLP Plan  Continue with current plan of care       Recommendations                   Oral Care Recommendations: Oral care BID Follow up Recommendations: Skilled Nursing facility SLP Visit Diagnosis: Apraxia (R48.2);Aphasia (R47.01) Plan: Continue with current plan of care       Reshunda Strider I. Hardin Negus, [place], [place] Office number [phone] Pager [place] 05/10/2018, 11:36 AM

## 2018-05-10 NOTE — Progress Notes (Signed)
PROGRESS NOTE   Kristina Summers  QXI:[id number]    DOB: [birth year]    DOA: 05/08/2018  PCP: [facility]   I have briefly reviewed patients previous medical records in [facility].  Brief Narrative:  83 y.o.femalewith medical history significant ofDM, PVD, A Fib on Eliquis, S/p TAVR 11/08/2017, HTN, HLD, diastolic HF, CAD s/p CABG, CKD, hearing loss, legal blindness, who presents with worsening right-sided weakness. She was recently evaluated at [facility] for aphasia as well as right leg weakness, found to have multiple left MCA infarcts with L M2/M3 occlusion, infarcts embolic secondary to known A fib. It was felt to be due to low dose Eliquis and her dosing was increased to 41mc BID, sent to SNF. She was diagnosed with new stroke.  Neurology consulted.   Assessment & Plan:   Principal Problem:   Right sided weakness Active Problems:   CAD (coronary artery disease)   Atrial fibrillation, chronic   Diabetes mellitus type 2 with peripheral artery disease (HCC)   Hyperlipidemia   S/P CABG (coronary artery bypass graft)   History of GI bleed   Essential hypertension   Type II diabetes mellitus with renal manifestations (HCC)   Severe aortic stenosis   S/P TAVR (transcatheter aortic valve replacement)   CKD (chronic kidney disease) stage 4, GFR 15-29 ml/min (HCC)   Foot ulcer, right ([place])   Cerebral embolism with cerebral infarction   Acute stroke Resultant pseudobulbar speech and right hemiparesis CT head: No acute stroke.  Old left MCA infarct.  Small vessel disease and atrophy. MRI brain: Multiple new left brain infarcts (left postcentral gyrus, superior frontal gyrus).  Old frontal infarct. Etiology: Embolic secondary to known A. fib on Eliquis. LDL 89 A1c 7.2. Patient was on apixaban prior to admission, continued apixaban dose adjusted to age and renal functions. Neurology consultation and stroke team follow-up appreciated.  They recommend no further stroke  work-up and patient has a follow-up appointment with Dr. Lavell Anchors, [facility] neurology on 05/25/2018 at 10:30 AM. As per neurology, patient presented with new infarcts from A. fib despite anticoagulation with Eliquis and unfortunately there is no data suggesting any benefit of switching to an alternative anticoagulant or adding aspirin and hence continuing Eliquis was recommended.  Chronic A. fib, status post TAVR Rate controlled.  Continue apixaban.  Essential hypertension Allow for permissive hypertension due to acute stroke.  Continue amlodipine 10 mg daily and metoprolol 25 mg twice daily.  Hyperlipidemia LDL 89, goal <70.  Continue atorvastatin 80 mg daily.  Type II DM A1c 7.2, goal <7.  Currently on NPH insulin 15 units in the morning, 10 units in the evening along with NovoLog SSI.  CBGs uncontrolled mostly in the 200s, likely precipitated by prednisone.  Changed SSI to moderate sensitivity.  Chronic systolic CHF Clinically compensated.  Continue oral Lasix and metoprolol.  Stage IV chronic kidney disease: Baseline creatinine 1.5.  Creatinine yesterday was 1.69.  Follow BMP in a.m.  Chronically elevated troponin No chest pain reported.  Normocytic anemia/suspect chronic disease. No overt bleeding reported.  Hemoglobin down to 6.9 on 1/22.  Transfusing 1 unit PRBCs.  Patient and family consented.  Follow CBC posttransfusion.  Chronic right foot ulcer Recently completed doxycycline.  Does not appear acutely infected at this time.  Wound care as per wound care RN.  Temporal arteritis Follows with Dr. Lavell Anchors.  On high-dose prednisone 50 mg daily, need to discuss with neurology if we can start tapering given difficulty in glycemic control.  GERD Continue PPI  Thrombocytopenia Stable.  Overweight/Body mass index is 28.96 kg/m.    DVT prophylaxis: Anticoagulated on apixaban. Code Status: DNR Family Communication: Discussed in detail with patient's daughter via phone, updated  care and answered questions.  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 Disposition: DC to SNF, hopefully 1/23.   Consultants:  Neurology  Procedures:  None  Antimicrobials:  None   Subjective: Difficult history due to patient's aphasia.  She denies bleeding, melena or blood in stools.  No vomiting.  No pain reported.  ROS: As above  Objective:  Vitals:   05/10/18 1326 05/10/18 1453 05/10/18 1519 05/10/18 1644  BP: (!) 163/66 (!) 145/63 (!) 172/55 (!) 176/53  Pulse: 71 78 75 77  Resp: 16 18 14 16   Temp: 97.7 F (36.5 C) 98 F (36.7 C)  97.6 F (36.4 C)  TempSrc: Oral Oral Oral Oral  SpO2: 92%   97%  Weight:      Height:        Examination:  General exam: Pleasant elderly female, moderately built and overweight sitting up propped up in bed.  Does not appear in any distress. Respiratory system: Clear to auscultation. Respiratory effort normal. Cardiovascular system: S1 & S2 heard, RRR. No JVD, murmurs, rubs, gallops or clicks. No pedal edema.  Telemetry personally reviewed: A. fib with controlled ventricular rate, BBB morphology.  12 beat NSVT noted at approximately 4 AM. Gastrointestinal system: Abdomen is nondistended, soft and nontender. No organomegaly or masses felt. Normal bowel sounds heard. Central nervous system: Alert and oriented to self and partly to place.  A phasic. Extremities: Right upper extremity grade 4 x 5 power, right lower extremity grade 1 x 5 power, left extremities grade 5 x 5 power.  Upper extremities with a couple of small superficial bruises. Skin: No rashes, lesions or ulcers Psychiatry: Judgement and insight impaired. Mood & affect flat.     Data Reviewed: I have personally reviewed following labs and imaging studies  CBC: Recent Labs  Lab 05/08/18 1400 05/08/18 1425 05/09/18 0648 05/10/18 0757  WBC 11.8*  --  11.2* 13.3*  NEUTROABS 11.2*  --   --   --   HGB 7.7* 8.8* 7.1* 6.9*  HCT 26.3* 26.0* 24.1* 23.4*    MCV 92.3  --  88.9 88.6  PLT 156  --  145* 347*   Basic Metabolic Panel: Recent Labs  Lab 05/08/18 1400 05/08/18 1425 05/09/18 0648  NA 139 140 141  K 4.0 4.1 3.9  CL 107 109 110  CO2 23  --  24  GLUCOSE 143* 138* 147*  BUN 59* 58* 61*  CREATININE 1.66* 1.50* 1.69*  CALCIUM 8.4*  --  8.3*   Liver Function Tests: Recent Labs  Lab 05/08/18 1400  AST 42*  ALT 39  ALKPHOS 115  BILITOT 1.0  PROT 6.2*  ALBUMIN 2.1*   Coagulation Profile: Recent Labs  Lab 05/08/18 1400  INR 1.41   CBG: Recent Labs  Lab 05/09/18 1715 05/09/18 2135 05/10/18 0622 05/10/18 1206 05/10/18 1648  GLUCAP 216* 260* 224* 238* 357*    No results found for this or any previous visit (from the past 240 hour(s)).       Radiology Studies: Mr Brain Wo Contrast  Result Date: 05/08/2018 CLINICAL DATA:  Focal neurologic deficit with worsening right-sided weakness. EXAM: MRI HEAD WITHOUT CONTRAST TECHNIQUE: Multiplanar, multiecho pulse sequences of the brain and surrounding structures were obtained without intravenous contrast. COMPARISON:  Head CT 05/08/2018 Brain MRI 04/18/2018 FINDINGS: BRAIN: There is a small amount of diffusion restriction along the left postcentral gyrus and the superior frontal gyrus. Distribution has changed slightly from the prior study with new sites compared to 04/18/2018. The midline structures are normal. No midline shift or other mass effect. There is encephalomalacia at the site of an old left frontal infarct. Multifocal white matter hyperintensity, most commonly due to chronic ischemic microangiopathy. The cerebral and cerebellar volume are age-appropriate. Mild hemosiderin deposition over the site of the old left frontal infarct. VASCULAR: Major intracranial arterial and venous sinus flow voids are normal. SKULL AND UPPER CERVICAL SPINE: Calvarial bone marrow signal is normal. There is no skull base mass. Visualized upper cervical spine and soft tissues are normal.  SINUSES/ORBITS: No fluid levels or advanced mucosal thickening. No mastoid or middle ear effusion. The orbits are normal. IMPRESSION: 1. Multiple new foci of acute ischemia within the left hemisphere, in close proximity to the previously demonstrated lesions. 2. No hemorrhage or mass effect. 3. Old left frontal lobe infarct and chronic ischemic microangiopathy. Electronically Signed   By: Ulyses Jarred M.D.   On: 05/08/2018 22:48        Scheduled Meds: . amLODipine  10 mg Oral Daily  . apixaban  2.5 mg Oral BID  . atorvastatin  80 mg Oral q1800  . collagenase   Topical Daily  . ferrous sulfate  325 mg Oral Q breakfast  . furosemide  40 mg Oral Daily  . insulin aspart  0-9 Units Subcutaneous TID WC  . insulin NPH Human  10 Units Subcutaneous QHS  . insulin NPH Human  15 Units Subcutaneous QAC breakfast  . metoprolol tartrate  25 mg Oral BID  . pantoprazole  40 mg Oral Daily  . predniSONE  50 mg Oral Q breakfast   Continuous Infusions:   LOS: 2 days     Vernell Leep, MD, FACP, [facility]  To contact the attending provider between 7A-7P or the covering provider during after hours 7P-7A, please log into the web site [email or web address] and access using universal [place] password for that web site. If you do not have the password, please call the hospital operator.  05/10/2018, 6:03 PM

## 2018-05-10 NOTE — Progress Notes (Signed)
STROKE TEAM PROGRESS NOTE   INTERVAL HISTORY Her speech therapists is  at the bedside.  She is up in the   Bed .  She remains aphasic  Vitals:   05/09/18 2314 05/10/18 0314 05/10/18 0827 05/10/18 1326  BP: (!) 162/59 (!) 162/76 (!) 153/66 (!) 163/66  Pulse: 73 73 79 71  Resp: (!) 25 11 18 16   Temp: 98.1 F (36.7 C) 97.6 F (36.4 C) 97.7 F (36.5 C) 97.7 F (36.5 C)  TempSrc: Oral Oral Oral Oral  SpO2: 95% 93% 96% 92%  Weight:      Height:        CBC:  Recent Labs  Lab 05/08/18 1400  05/09/18 0648 05/10/18 0757  WBC 11.8*  --  11.2* 13.3*  NEUTROABS 11.2*  --   --   --   HGB 7.7*   < > 7.1* 6.9*  HCT 26.3*   < > 24.1* 23.4*  MCV 92.3  --  88.9 88.6  PLT 156  --  145* 144*   < > = values in this interval not displayed.    Basic Metabolic Panel:  Recent Labs  Lab 05/08/18 1400 05/08/18 1425 05/09/18 0648  NA 139 140 141  K 4.0 4.1 3.9  CL 107 109 110  CO2 23  --  24  GLUCOSE 143* 138* 147*  BUN 59* 58* 61*  CREATININE 1.66* 1.50* 1.69*  CALCIUM 8.4*  --  8.3*   Lipid Panel:     Component Value Date/Time   CHOL 150 04/19/2018 0427   CHOL 111 02/07/2018 1135   TRIG 80 04/19/2018 0427   HDL 45 04/19/2018 0427   HDL 33 (L) 02/07/2018 1135   CHOLHDL 3.3 04/19/2018 0427   VLDL 16 04/19/2018 0427   LDLCALC 89 04/19/2018 0427   LDLCALC 61 02/07/2018 1135   HgbA1c:  Lab Results  Component Value Date   HGBA1C 7.2 (H) 04/15/2018   Urine Drug Screen: No results found for: LABOPIA, COCAINSCRNUR, LABBENZ, AMPHETMU, THCU, LABBARB  Alcohol Level     Component Value Date/Time   ETH <10 05/08/2018 1400    IMAGING Ct Head Wo Contrast  Result Date: 05/08/2018 CLINICAL DATA:  83 year old female with weakness and decreased mobility. EXAM: CT HEAD WITHOUT CONTRAST TECHNIQUE: Contiguous axial images were obtained from the base of the skull through the vertex without intravenous contrast. COMPARISON:  Recent prior CT scan of the head 04/18/2018 FINDINGS: Brain:  Similar appearance of left MCA territory encephalomalacia consistent with a remote infarct. Multiple additional punctate cortical infarcts visualized on the prior MRI are difficult to perceive by CT imaging. No definite new acute infarct, ischemia. No evidence of intracranial hemorrhage, mass lesion, mass effect or hydrocephalus. Stable atrophy and moderate chronic microvascular ischemic white matter disease. Vascular: No hyperdense vessel or unexpected calcification. Atherosclerotic calcifications in the bilateral cavernous and supraclinoid internal carotid arteries. Skull: Normal. Negative for fracture or focal lesion. Sinuses/Orbits: No acute finding. Other: None. IMPRESSION: 1. No acute intracranial abnormality. 2. Stable appearance of remote left MCA territory infarct, mild cortical atrophy and moderate chronic microvascular ischemic white matter disease. Electronically Signed   By: Jacqulynn Cadet M.D.   On: 05/08/2018 15:57   Mr Brain Wo Contrast  Result Date: 05/08/2018 CLINICAL DATA:  Focal neurologic deficit with worsening right-sided weakness. EXAM: MRI HEAD WITHOUT CONTRAST TECHNIQUE: Multiplanar, multiecho pulse sequences of the brain and surrounding structures were obtained without intravenous contrast. COMPARISON:  Head CT 05/08/2018 Brain MRI 04/18/2018 FINDINGS: BRAIN: There  is a small amount of diffusion restriction along the left postcentral gyrus and the superior frontal gyrus. Distribution has changed slightly from the prior study with new sites compared to 04/18/2018. The midline structures are normal. No midline shift or other mass effect. There is encephalomalacia at the site of an old left frontal infarct. Multifocal white matter hyperintensity, most commonly due to chronic ischemic microangiopathy. The cerebral and cerebellar volume are age-appropriate. Mild hemosiderin deposition over the site of the old left frontal infarct. VASCULAR: Major intracranial arterial and venous sinus  flow voids are normal. SKULL AND UPPER CERVICAL SPINE: Calvarial bone marrow signal is normal. There is no skull base mass. Visualized upper cervical spine and soft tissues are normal. SINUSES/ORBITS: No fluid levels or advanced mucosal thickening. No mastoid or middle ear effusion. The orbits are normal. IMPRESSION: 1. Multiple new foci of acute ischemia within the left hemisphere, in close proximity to the previously demonstrated lesions. 2. No hemorrhage or mass effect. 3. Old left frontal lobe infarct and chronic ischemic microangiopathy. Electronically Signed   By: Ulyses Jarred M.D.   On: 05/08/2018 22:48    PHYSICAL EXAM Pleasant elderly Caucasian lady not in distress. . Afebrile. Head is nontraumatic. Neck is supple without bruit.    Cardiac exam no murmur or gallop. Lungs are clear to auscultation. Distal pulses are well felt. Neurological Exam :  Awake alert pseudobulbar speech with significant expressive language difficulties.moderate dysarthria. Follows simple midline and one-step commands only. Left gaze preference but able to look to the right past midline. She is legally blind bilaterally and visual fields cannot be reliably tested.right lower facial weakness. Tongue midline. Right hemiparesis 3/5 with good antigravity strength on the left side. Tone is increased more on the right than the left. Deep tendon reflexes are brisk. ASSESSMENT/PLAN Kristina Summers is a 83 y.o. female with history of diabetes, TAVR, PVD, PAF, hypertension, hyperlipidemia, recent CVA 03/2018 with resultant expressive?receptive aphasia and R sided weakness, chronic kidney disease, presenting with increased aphasia and R sided weakness.   Stroke:  New left brain infarcts adjacent to previous L brain infarcts from December 6979, infarcts  embolic secondary to known AF on Eliquis  Resultant pseudobulbar speech and right hemiparesis  CT head No acute stroke. Old L MCA infarct. Small vessel disease. Atrophy.     MRI   Multiple new L brain infarcts (L post central gyrus, superior frontal gyrus). Old L frontal infarct. Chronic microangiopathy  LDL 89  HgbA1c 7.2  Eliiquis for VTE prophylaxis  Eliquis (apixaban) daily prior to admission, now on Eliquis (apixaban) daily. Continue Eliquis at d/c  Therapy recommendations:  SNF  Disposition:  pending  No further stroke work-up needed Has follow-up appointment with Dr. Lavell Anchors at [facility] neurology on 05/25/2018 at 10:30 AM Stroke team will sign off.  Please call us for questions  Atrial Fibrillation, chronic s/p TAVR  Home anticoagulation:  Eliquis (apixaban) daily continued in the hospital . Continue Eliquis (apixaban) daily at discharge   Hypertension  Stable . Permissive hypertension (OK if < 220/120) but gradually normalize in 5-7 days . Long-term BP goal normotensive  Hyperlipidemia  Home meds:  lipitor 80, resumed in hospital  LDL 89, goal < 70  Continue statin at discharge  Diabetes type II  HgbA1c 7.2, goal < 7.0  Uncontrolled  Other Stroke Risk Factors  Advanced age  Former Cigarette smoker, quit 28 yrs ago  Overweight, Body mass index is 28.96 kg/m., recommend weight loss, diet and exercise as appropriate  Hx stroke/TIA  03/2018 - Mult small scattered left MCA infarcts c/w L M2/M3 occlusion, infarcts embolic secondary to known atrial fibrillation on low dose eliquis (2.5 bid) ? 05/2014 L MCA d/t AF started on [facility]  Coronary artery disease status post CABG   History of carotid disease,   Chronic systolic CHF  PVD  TAVR  Other Active Problems  CKD stage 4  Chronically elevated troponin  Chronic normocytic anemia  Chronic R foot ulcer  Temporal arteritis (presumed, biopsy negative but improvement on prednisone)  Fibromyalgia   GERD  Hospital day # 2    She presented with worsening of speech and right-sided weakness secondary to new embolic left brain infarcts from atrial fibrillation despite  anticoagulation with eliquis. Unfortunately there is no data suggesting any benefit of switching to an alternative anticoagulant or adding aspirin hence recommend continue eliquis. Therapy consults. No family available at bedside. Discussed with Dr. Yvone Neu. Stroke team will sign off. Kindly call for questions.     Antony Contras, MD Medical Director [facility] Stroke Center Pager: [phone] 05/10/2018 2:17 PM  To contact Stroke Continuity provider, please refer to [email or web address] After hours, contact General Neurology

## 2018-05-11 LAB — CBC
HCT: 27.9 % — ABNORMAL LOW (ref 36.0–46.0)
HEMOGLOBIN: 8.3 g/dL — AB (ref 12.0–15.0)
MCH: 26.7 pg (ref 26.0–34.0)
MCHC: 29.7 g/dL — ABNORMAL LOW (ref 30.0–36.0)
MCV: 89.7 fL (ref 80.0–100.0)
Platelets: 141 10*3/uL — ABNORMAL LOW (ref 150–400)
RBC: 3.11 MIL/uL — ABNORMAL LOW (ref 3.87–5.11)
RDW: 21 % — ABNORMAL HIGH (ref 11.5–15.5)
WBC: 14.5 10*3/uL — ABNORMAL HIGH (ref 4.0–10.5)
nRBC: 0 % (ref 0.0–0.2)

## 2018-05-11 LAB — BPAM RBC
Blood Product Expiration Date: 202002082359
ISSUE DATE / TIME: 202001221459
Unit Type and Rh: 6200

## 2018-05-11 LAB — GLUCOSE, CAPILLARY
Glucose-Capillary: 283 mg/dL — ABNORMAL HIGH (ref 70–99)
Glucose-Capillary: 229 mg/dL — ABNORMAL HIGH (ref 70–99)

## 2018-05-11 LAB — TYPE AND SCREEN
ABO/RH(D): A POS
Antibody Screen: NEGATIVE
Unit division: 0

## 2018-05-11 LAB — BASIC METABOLIC PANEL
Anion gap: 8 (ref 5–15)
BUN: 65 mg/dL — ABNORMAL HIGH (ref 8–23)
CO2: 22 mmol/L (ref 22–32)
Calcium: 8.1 mg/dL — ABNORMAL LOW (ref 8.9–10.3)
Chloride: 109 mmol/L (ref 98–111)
Creatinine, Ser: 1.85 mg/dL — ABNORMAL HIGH (ref 0.44–1.00)
GFR calc Af Amer: 29 mL/min — ABNORMAL LOW (ref >60)
GFR calc non Af Amer: 25 mL/min — ABNORMAL LOW (ref >60)
Glucose, Bld: 315 mg/dL — ABNORMAL HIGH (ref 70–99)
POTASSIUM: 3.9 mmol/L (ref 3.5–5.1)
Sodium: 139 mmol/L (ref 135–145)

## 2018-05-11 MED ORDER — FUROSEMIDE 40 MG PO TABS: 40.0000 mg | ORAL_TABLET | Freq: Every day | ORAL | Status: DC

## 2018-05-11 MED ORDER — INSULIN NPH (HUMAN) (ISOPHANE) 100 UNIT/ML ~~LOC~~ SUSP
20.0000 [IU] | Freq: Every day | SUBCUTANEOUS | Status: DC
  Administered 2018-05-11: 20 [IU] via SUBCUTANEOUS

## 2018-05-11 MED ORDER — ACETAMINOPHEN 325 MG PO TABS: 650.0000 mg | ORAL_TABLET | Freq: Four times a day (QID) | ORAL | Status: AC | PRN

## 2018-05-11 MED ORDER — APIXABAN 2.5 MG PO TABS: 2.5000 mg | ORAL_TABLET | Freq: Two times a day (BID) | ORAL | Status: DC

## 2018-05-11 MED ORDER — FUROSEMIDE 40 MG PO TABS: 40.0000 mg | ORAL_TABLET | Freq: Every day | ORAL | Status: AC

## 2018-05-11 MED ORDER — PREDNISONE 20 MG PO TABS: 40.0000 mg | ORAL_TABLET | Freq: Every day | ORAL | Status: DC

## 2018-05-11 MED ORDER — INSULIN NPH (HUMAN) (ISOPHANE) 100 UNIT/ML ~~LOC~~ SUSP: 10.0000 [IU] | Freq: Every day | SUBCUTANEOUS | Status: AC

## 2018-05-11 MED ORDER — INSULIN ASPART 100 UNIT/ML ~~LOC~~ SOLN: 0.0000 [IU] | Freq: Three times a day (TID) | SUBCUTANEOUS | Status: AC

## 2018-05-11 MED ORDER — INSULIN NPH (HUMAN) (ISOPHANE) 100 UNIT/ML ~~LOC~~ SUSP: 20.0000 [IU] | Freq: Every day | SUBCUTANEOUS | Status: DC

## 2018-05-11 MED ORDER — COLLAGENASE 250 UNIT/GM EX OINT: 1.0000 "application " | TOPICAL_OINTMENT | Freq: Every day | CUTANEOUS | Status: DC

## 2018-05-11 MED ORDER — ATORVASTATIN CALCIUM 80 MG PO TABS: 80.0000 mg | ORAL_TABLET | Freq: Every day | ORAL | Status: DC

## 2018-05-11 NOTE — Plan of Care (Signed)
Adequate for discharge.

## 2018-05-11 NOTE — Progress Notes (Signed)
Discharge to: [facility] Anticipated discharge date: 05/11/18 Family notified: Roderic Scarce, by phone Transportation by: [facility]  Report #: [phone], Room 603P [place] signing off.  Laveda Abbe LCSW [phone]

## 2018-05-11 NOTE — Discharge Instructions (Signed)

## 2018-05-11 NOTE — Discharge Summary (Signed)
Physician Discharge Summary  Kristina Summers ZOX:[id number] DOB: March 01, 1936  PCP: [facility], [place] date: 05/08/2018 Discharge date: 05/11/2018  Recommendations for Outpatient Follow-up:  1. MD at SNF in 2 to 3 days with repeat labs (CBC & BMP). 2. Dr. Sarina Ill, [facility] on 05/25/2018 at 10:30 AM. 3. Monitor glycemic control closely at SNF and adjust insulins as needed. 4. Continue to gradually taper and wean off of prednisone.  Home Health: Patient being discharged to [facility], SNF. Equipment/Devices: TBD at SNF.  Discharge Condition: Improved and stable CODE STATUS: DNR. Diet recommendation: Heart healthy & diabetic diet.  Discharge Diagnoses:  Principal Problem:   Right sided weakness Active Problems:   CAD (coronary artery disease)   Atrial fibrillation, chronic   Diabetes mellitus type 2 with peripheral artery disease (HCC)   Hyperlipidemia   S/P CABG (coronary artery bypass graft)   History of GI bleed   Essential hypertension   Type II diabetes mellitus with renal manifestations (HCC)   Severe aortic stenosis   S/P TAVR (transcatheter aortic valve replacement)   CKD (chronic kidney disease) stage 4, GFR 15-29 ml/min (HCC)   Foot ulcer, right (HCC)   Cerebral embolism with cerebral infarction   Brief Summary: 83 y.o.femalewith medical history significant ofDM, PVD, A Fib on Eliquis, S/p TAVR 11/08/2017, HTN, HLD, chronic diastolic HF, CAD s/p CABG, CKD, hearing loss, legal blindness, who presents with worsening right-sided weakness. She was recently evaluated at [facility] for aphasia as well as right leg weakness, found to have multiple left MCA infarcts with L M2/M3 occlusion, infarcts embolic secondary to known A fib. It was felt to be due to low dose Eliquis and her dosing was increased to 5mg  BID, sent to SNF. She was diagnosed with new stroke.  Neurology consulted.   Assessment & Plan:   Acute stroke Resultant pseudobulbar  speech and right hemiparesis CT head: No acute stroke.  Old left MCA infarct.  Small vessel disease and atrophy. MRI brain: Multiple new left brain infarcts (left postcentral gyrus, superior frontal gyrus).  Old frontal infarct. Etiology: Embolic secondary to known A. fib on Eliquis. LDL 89 A1c 7.2. Patient was on apixaban prior to admission, continued apixaban dose adjusted to age and renal functions. Neurology consultation and stroke team follow-up appreciated.  They recommend no further stroke work-up and patient has a follow-up appointment with Neurology. As per neurology, patient presented with new infarcts from A. fib despite anticoagulation with Eliquis and unfortunately there is no data suggesting any benefit of switching to an alternative anticoagulant or adding aspirin and hence continuing Eliquis was recommended.  Chronic A. fib, status post TAVR Rate controlled.  Continue apixaban which was dose adjusted to her age and renal insufficiency.  Continue metoprolol.  Essential hypertension Allowed for permissive hypertension due to acute stroke.  Continue amlodipine 10 mg daily and metoprolol 25 mg twice daily.  Hyperlipidemia LDL 89, goal <70.  Continue atorvastatin 80 mg daily.  Type II DM A1c 7.2, goal <7.  Was on NPH insulin 15 units in the morning, 10 units in the evening along with NovoLog SSI.  CBGs uncontrolled mostly in the 200s, likely precipitated by prednisone.  Adjusted NPH insulin dose today as below, continue NovoLog SSI and gradually weaning off prednisone.  Continue Tradjenta.  Chronic systolic CHF Clinically compensated.  Continue oral Lasix and metoprolol.  Stage IV chronic kidney disease: Creatinine over the last couple of months has fluctuated mostly in the 1.6-low 2 range.  Creatinine jumped from 1.6-1.85 today.  Lasix held for today and resume tomorrow.  Follow BMP closely.  Avoid nephrotoxic's.  Consider outpatient Nephrology consultation and  follow-up.  Chronically elevated troponin No chest pain reported.  Normocytic anemia/suspect chronic disease. No overt bleeding reported.  Hemoglobin down to 6.9 on 1/22.    Transfused a unit of PRBC and hemoglobin improved to 8.3.  Recent anemia panel end of last year, iron 19, TIBC 211, saturation ratio 9, ferritin 104, folate 23 and B12: 852.  Follow CBCs closely at SNF.  Chronic right foot ulcer Recently completed doxycycline.  Does not appear acutely infected at this time.  Wound care as per wound care RN.  Temporal headache I discussed with [name] who was not aware of temporal arteritis diagnosis or prednisone initiation.  We reviewed chart together and it appears that high-dose prednisone was started in ED on 04/10/2018.  As per DC summary 12/28, vascular surgery was consulted for biopsy of temporal artery which he underwent on 12/26 to evaluate for temporal arteritis and pathology was unrevealing.  Symptoms improved.  She was to undergo prednisone taper by 10 mg every 2 weeks but it appears that never happened.  I discussed with [name] who recommended tapering prednisone by 10 mg every week to off.  No headache reported.  GERD Continue PPI  Thrombocytopenia Stable.  Follow CBCs periodically at SNF.  Overweight/Body mass index is 28.96 kg/m.    Consultants:  Neurology  Procedures:  None   Discharge Instructions  Discharge Instructions    (HEART FAILURE PATIENTS) Call MD:  Anytime you have any of the following symptoms: 1) 3 pound weight gain in 24 hours or 5 pounds in 1 week 2) shortness of breath, with or without a dry hacking cough 3) swelling in the hands, feet or stomach 4) if you have to sleep on extra pillows at night in order to breathe.   Complete by:  As directed    Call MD for:   Complete by:  As directed    Recurrent strokelike symptoms.   Call MD for:  difficulty breathing, headache or visual disturbances   Complete by:  As directed    Call  MD for:  extreme fatigue   Complete by:  As directed    Call MD for:  persistant dizziness or light-headedness   Complete by:  As directed    Call MD for:  persistant nausea and vomiting   Complete by:  As directed    Call MD for:  severe uncontrolled pain   Complete by:  As directed    Call MD for:  temperature >100.4   Complete by:  As directed    Diet - low sodium heart healthy   Complete by:  As directed    Diet Carb Modified   Complete by:  As directed    Increase activity slowly   Complete by:  As directed        Medication List    STOP taking these medications   mupirocin cream 2 % Commonly known as:  BACTROBAN   nutrition supplement (JUVEN) Pack     TAKE these medications   acetaminophen 325 MG tablet Commonly known as:  TYLENOL Take 2 tablets (650 mg total) by mouth every 6 (six) hours as needed for mild pain, moderate pain or headache. What changed:    when to take this  reasons to take this  Another medication with the same name was removed. Continue taking this medication,  and follow the directions you see here.   amLODipine 10 MG tablet Commonly known as:  NORVASC Take 10 mg by mouth daily.   apixaban 2.5 MG Tabs tablet Commonly known as:  ELIQUIS Take 1 tablet (2.5 mg total) by mouth 2 (two) times daily. What changed:    medication strength  how much to take   atorvastatin 80 MG tablet Commonly known as:  LIPITOR Take 1 tablet (80 mg total) by mouth daily at 6 PM. What changed:  when to take this   collagenase ointment Commonly known as:  SANTYL Apply 1 application topically daily. Apply to right foot, lateral 5th digit full thickness wound in a 1/8 inch layer.  Top with a saline moistened gauze 2x2, cover with dry dressing and secure with medipore tape. Place foot into Prevalon boot to correct lateral rotation. What changed:    when to take this  additional instructions   feeding supplement (PRO-STAT SUGAR FREE 64) Liqd Take 30 mLs by  mouth 2 (two) times daily.   ferrous sulfate 325 (65 FE) MG tablet Take 325 mg by mouth daily with breakfast.   furosemide 40 MG tablet Commonly known as:  LASIX Take 1 tablet (40 mg total) by mouth daily.   insulin aspart 100 UNIT/ML injection Commonly known as:  novoLOG Inject 0-15 Units into the skin 3 (three) times daily with meals. CBG < 70: implement hypoglycemia protocol CBG 70 - 120: 0 units CBG 121 - 150: 2 units CBG 151 - 200: 3 units CBG 201 - 250: 5 units CBG 251 - 300: 8 units CBG 301 - 350: 11 units CBG 351 - 400: 15 units CBG > 400: call MD. What changed:    how much to take  how to take this  when to take this  additional instructions   insulin NPH Human 100 UNIT/ML injection Commonly known as:  HUMULIN N,NOVOLIN N Inject 0.1 mLs (10 Units total) into the skin at bedtime. What changed:    how much to take  when to take this   insulin NPH Human 100 UNIT/ML injection Commonly known as:  HUMULIN N,NOVOLIN N Inject 0.2 mLs (20 Units total) into the skin daily before breakfast. What changed:    how much to take  when to take this   linagliptin 5 MG Tabs tablet Commonly known as:  TRADJENTA Take 5 mg by mouth daily.   metoprolol tartrate 25 MG tablet Commonly known as:  LOPRESSOR Take 1 tablet (25 mg total) by mouth 2 (two) times daily.   multivitamin with minerals Tabs tablet Take 1 tablet by mouth daily. One a Day 50 plus   nitroGLYCERIN 0.4 MG SL tablet Commonly known as:  NITROSTAT Place 0.4 mg under the tongue every 5 (five) minutes as needed for chest pain.   pantoprazole 40 MG tablet Commonly known as:  PROTONIX Take 1 tablet (40 mg total) by mouth daily for 30 doses.   predniSONE 20 MG tablet Commonly known as:  DELTASONE Take 2 tablets (40 mg total) by mouth daily with breakfast. Taper down by 10 mg every week. Start taking on:  May 12, 2018 What changed:    medication strength  how much to take  additional  instructions   senna 8.6 MG Tabs tablet Commonly known as:  SENOKOT Take 1 tablet by mouth as needed for mild constipation.      Follow-up Information    Melvenia Beam, MD Follow up on 05/25/2018.   Specialty:  Neurology  Why:  at [place] information: [place] [id number] [phone]        MD at SNF. Schedule an appointment as soon as possible for a visit.   Why:  To be seen in 2 to 3 days with repeat labs (CBC & BMP).         Allergies  Allergen Reactions  . Morphine And Related Nausea And Vomiting and Other (See Comments)    Chest pain, also   . Lisinopril Cough  . Zoloft [Sertraline Hcl] Nausea Only           Procedures/Studies:  Ct Head Wo Contrast  Result Date: 05/08/2018 CLINICAL DATA:  83 year old female with weakness and decreased mobility. EXAM: CT HEAD WITHOUT CONTRAST TECHNIQUE: Contiguous axial images were obtained from the base of the skull through the vertex without intravenous contrast. COMPARISON:  Recent prior CT scan of the head 04/18/2018 FINDINGS: Brain: Similar appearance of left MCA territory encephalomalacia consistent with a remote infarct. Multiple additional punctate cortical infarcts visualized on the prior MRI are difficult to perceive by CT imaging. No definite new acute infarct, ischemia. No evidence of intracranial hemorrhage, mass lesion, mass effect or hydrocephalus. Stable atrophy and moderate chronic microvascular ischemic white matter disease. Vascular: No hyperdense vessel or unexpected calcification. Atherosclerotic calcifications in the bilateral cavernous and supraclinoid internal carotid arteries. Skull: Normal. Negative for fracture or focal lesion. Sinuses/Orbits: No acute finding. Other: None. IMPRESSION: 1. No acute intracranial abnormality. 2. Stable appearance of remote left MCA territory infarct, mild cortical atrophy and moderate chronic microvascular ischemic white matter disease. Electronically  Signed   By: Jacqulynn Cadet M.D.   On: 05/08/2018 15:57   Mr Brain Wo Contrast  Result Date: 05/08/2018 CLINICAL DATA:  Focal neurologic deficit with worsening right-sided weakness. EXAM: MRI HEAD WITHOUT CONTRAST TECHNIQUE: Multiplanar, multiecho pulse sequences of the brain and surrounding structures were obtained without intravenous contrast. COMPARISON:  Head CT 05/08/2018 Brain MRI 04/18/2018 FINDINGS: BRAIN: There is a small amount of diffusion restriction along the left postcentral gyrus and the superior frontal gyrus. Distribution has changed slightly from the prior study with new sites compared to 04/18/2018. The midline structures are normal. No midline shift or other mass effect. There is encephalomalacia at the site of an old left frontal infarct. Multifocal white matter hyperintensity, most commonly due to chronic ischemic microangiopathy. The cerebral and cerebellar volume are age-appropriate. Mild hemosiderin deposition over the site of the old left frontal infarct. VASCULAR: Major intracranial arterial and venous sinus flow voids are normal. SKULL AND UPPER CERVICAL SPINE: Calvarial bone marrow signal is normal. There is no skull base mass. Visualized upper cervical spine and soft tissues are normal. SINUSES/ORBITS: No fluid levels or advanced mucosal thickening. No mastoid or middle ear effusion. The orbits are normal. IMPRESSION: 1. Multiple new foci of acute ischemia within the left hemisphere, in close proximity to the previously demonstrated lesions. 2. No hemorrhage or mass effect. 3. Old left frontal lobe infarct and chronic ischemic microangiopathy. Electronically Signed   By: Ulyses Jarred M.D.   On: 05/08/2018 22:48    Subjective: Difficult historian due to aphasia.  Oriented to self and partly to time.  No complaints reported.  As per RN, no acute issues noted.  No overt bleeding issues.  Discharge Exam:  Vitals:   05/11/18 0443 05/11/18 0600 05/11/18 0833 05/11/18 1220   BP: (!) 185/65 (!) 172/72 (!) 172/72 (!) 166/63  Pulse: 77 77 77 67  Resp: 15  20 20  Temp: 97.7 F (36.5 C) 97.8 F (36.6 C) 97.8 F (36.6 C) 97.9 F (36.6 C)  TempSrc: Oral Oral Oral Axillary  SpO2: 91% 97% 96% 94%  Weight:      Height:        General exam: Pleasant elderly female, moderately built and overweight sitting up propped up in bed.  Does not appear in any distress. Respiratory system: Clear to auscultation. Respiratory effort normal. Cardiovascular system: S1 & S2 heard, RRR. No JVD, murmurs, rubs, gallops or clicks. No pedal edema.    Telemetry personally reviewed: A. fib with controlled ventricular rate, BBB morphology. Gastrointestinal system: Abdomen is nondistended, soft and nontender. No organomegaly or masses felt. Normal bowel sounds heard. Central nervous system: Alert and oriented to self and partly to place.  Aphasic. Extremities: Right upper extremity grade 3 x 5 power, right lower extremity grade 1 x 5 power at least, left extremities grade 4 x 5 power at least.  Upper extremities with a couple of small superficial bruises. Skin: No rashes, lesions or ulcers Psychiatry: Judgement and insight impaired. Mood & affect flat.    The results of significant diagnostics from this hospitalization (including imaging, microbiology, ancillary and laboratory) are listed below for reference.      Labs: CBC: Recent Labs  Lab 05/08/18 1400 05/08/18 1425 05/09/18 0648 05/10/18 0757 05/10/18 1926 05/11/18 0511  WBC 11.8*  --  11.2* 13.3* 12.0* 14.5*  NEUTROABS 11.2*  --   --   --   --   --   HGB 7.7* 8.8* 7.1* 6.9* 8.2* 8.3*  HCT 26.3* 26.0* 24.1* 23.4* 27.3* 27.9*  MCV 92.3  --  88.9 88.6 88.9 89.7  PLT 156  --  145* 144* 152 570*   Basic Metabolic Panel: Recent Labs  Lab 05/08/18 1400 05/08/18 1425 05/09/18 0648 05/11/18 0511  NA 139 140 141 139  K 4.0 4.1 3.9 3.9  CL 107 109 110 109  CO2 23  --  24 22  GLUCOSE 143* 138* 147* 315*  BUN 59* 58* 61*  65*  CREATININE 1.66* 1.50* 1.69* 1.85*  CALCIUM 8.4*  --  8.3* 8.1*   Liver Function Tests: Recent Labs  Lab 05/08/18 1400  AST 42*  ALT 39  ALKPHOS 115  BILITOT 1.0  PROT 6.2*  ALBUMIN 2.1*    CBG: Recent Labs  Lab 05/10/18 1206 05/10/18 1648 05/10/18 2158 05/11/18 0651 05/11/18 1129  GLUCAP 238* 357* 341* 283* 229*   I discussed in detail with patient's 2 daughters at bedside.  One of them works at [employer] as an Therapist, sports.  I updated care and answered all questions.   Time coordinating discharge: 40 minutes  SIGNED:  Vernell Leep, MD, FACP, [facility]. [facility]  To contact the attending provider between 7A-7P or the covering provider during after hours 7P-7A, please log into the web site [email or web address] and access using universal [place] password for that web site. If you do not have the password, please call the hospital operator.

## 2018-05-11 NOTE — Clinical Social Work Note (Signed)
Clinical Social Work Assessment  Patient Details  Name: Kristina Summers MRN: [id number] Date of Birth: [birth year]  Date of referral:  05/11/18               Reason for consult:  Facility Placement                Permission sought to share information with:  Facility Sport and exercise psychologist, Family Supports Permission granted to share information::  Yes, Verbal Permission Granted  Name::     Sharrell Ku  Agency::  SNF  Relationship::  Daughters  Sport and exercise psychologist Information:     Housing/Transportation Living arrangements for the past 2 months:  [place], [place] of Information:  Medical Team, Adult Children Patient Interpreter Needed:  None Criminal Activity/Legal Involvement Pertinent to Current Situation/Hospitalization:  No - Comment as needed Significant Relationships:  Adult Children Lives with:  Self Do you feel safe going back to the place where you live?  Yes Need for family participation in patient care:  Yes (Comment)  Care giving concerns:  Patient from [facility] where she had been for SNF since last admission. Patient's daughters have minor concerns about care, mostly because they feel like patient is homesick and doesn't want to be in SNF, but they know she has to return because she's not safe for them to care for her at this time.   Social Worker assessment / plan:  CSW met with patient's daughters at bedside to discuss discharge plan. Patient's daughters hopeful to eventually take patient home with them, but for now, the patient will have to return to SNF for rehab to get stronger. Patient's daughters asked about set up for the patient when she returns home, and CSW discussed how Ronney Lion will set that up for them when she's ready to return home. CSW to follow for discharge to [place] today.  Employment status:  Retired Forensic scientist:  [employer] PT Recommendations:  [place] / Referral to community resources:  [place]  Patient/Family's Response to care:  Patient's daughters appreciative of CSW information, and while they are not happy that the patient is so weak to require SNF, they know that's what she needs at this time.  Patient/Family's Understanding of and Emotional Response to Diagnosis, Current Treatment, and Prognosis:  Patient's daughters discussed how they feel like the patient is sad about having to go to SNF and she would rather be home, but they saw her work with PT yesterday and they know that they are not capable of providing the care that she needs at home until she gets stronger. Patient's daughters are hopeful about the future, that she can get stronger so that they can eventually move her back home with them.  Emotional Assessment Appearance:  Appears stated age Attitude/Demeanor/Rapport:  Unable to Assess Affect (typically observed):  Unable to Assess Orientation:  Oriented to Self, Oriented to Place Alcohol / Substance use:  Not Applicable Psych involvement (Current and /or in the community):  No (Comment)  Discharge Needs  Concerns to be addressed:  Care Coordination Readmission within the last 30 days:  No Current discharge risk:  Physical Impairment, Dependent with Mobility, Lives alone Barriers to Discharge:  No Barriers Identified   Geralynn Ochs, LCSW 05/11/2018, 10:56 AM

## 2018-05-11 NOTE — NC FL2 (Signed)
[place] LEVEL OF CARE SCREENING TOOL     IDENTIFICATION  Patient Name: Kristina Summers Birthdate: 1935-06-10 Sex: female Admission Date (Current Location): 05/08/2018  [facility] and [place] Number:  Herbalist and Address:  [facility] [place], [place] [address] [place], [place] [address]      Provider Number: [id number]  Attending Physician Name and Address:  Modena Jansky, MD  Relative Name and Phone Number:       Current Level of Care: Hospital Recommended Level of Care: [place] Prior Approval Number:    Date Approved/Denied:   PASRR Number: [id number]  Discharge Plan: SNF    Current Diagnoses: Patient Active Problem List   Diagnosis Date Noted  . Cerebral embolism with cerebral infarction 05/09/2018  . Right sided weakness 05/08/2018  . Acute ischemic stroke ([place])   . Labile blood glucose   . Benign essential HTN   . Leukocytosis   . CVA (cerebral vascular accident) ([place]) 04/18/2018  . CHF exacerbation ([place]) 04/10/2018  . Acute exacerbation of congestive heart failure ([place]) 03/20/2018  . Gastrointestinal hemorrhage   . Acute on chronic systolic CHF (congestive heart failure) ([place]) 03/13/2018  . Decreased hearing 02/03/2018  . History of myocardial infarction 02/03/2018  . Methicillin resistant Staphylococcus aureus infection 02/03/2018  . Acute blood loss anemia 12/22/2017  . MRSA bacteremia 12/12/2017  . CKD (chronic kidney disease) stage 4, GFR 15-29 ml/min (HCC) 12/11/2017  . Foot ulcer, right ([place]) 12/11/2017  . 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 Severe aortic stenosis 10/11/2017  . Hypochromic anemia 08/25/2017  . Essential hypertension 06/10/2017  . Type II diabetes mellitus with renal manifestations ([place]) 06/10/2017  . Acute on chronic congestive heart failure ([place]) 01/28/2017  . Fall   . Fracture of distal end of right fibula 05/29/2014  . History of CVA  (cerebrovascular accident) 05/24/2014  . Peripheral vascular disease, unspecified ([place]) 01/11/2014  . CAD (coronary artery disease)   . Atrial fibrillation, chronic   . Diabetes mellitus type 2 with peripheral artery disease ([place])   . Hyperlipidemia   . History of GI bleed 04/21/2009  . S/P CABG (coronary artery bypass graft) 11/20/1998    Orientation RESPIRATION BLADDER Height & Weight     Self, Place  O2(see DC summary) Continent, Incontinent, External catheter(incontinent at times) Weight: 179 lb 7.3 oz (81.4 kg) Height:  5\' 6"  (167.6 cm)  BEHAVIORAL SYMPTOMS/MOOD NEUROLOGICAL BOWEL NUTRITION STATUS      Continent Diet(see DC summary)  AMBULATORY STATUS COMMUNICATION OF NEEDS Skin   Extensive Assist Verbally Other (Comment)(diabetic foot ulcer, gauze dressing changed daily)                       Personal Care Assistance Level of Assistance  Bathing, Feeding, Dressing Bathing Assistance: Maximum assistance Feeding assistance: Limited assistance Dressing Assistance: Maximum assistance     Functional Limitations Info  Sight, Hearing, Speech Sight Info: Impaired Hearing Info: Impaired Speech Info: Impaired    SPECIAL CARE FACTORS FREQUENCY  PT (By licensed PT), OT (By licensed OT)     PT Frequency: 5x/wk OT Frequency: 5x/wk            Contractures Contractures Info: Not present    Additional Factors Info  Code Status, Allergies, Insulin Sliding Scale Code Status Info: DNR Allergies Info: Morphine And Related, Lisinopril, Zoloft Sertraline Hcl   Insulin Sliding Scale Info: 0-15 units 3x/day with meals; 0-5 units at  bed; Novolin 10 units at bed; Novolin 20 units before breakfast       Current Medications (05/11/2018):  This is the current hospital active medication list Current Facility-Administered Medications  Medication Dose Route Frequency Provider Last Rate Last Dose  . acetaminophen (TYLENOL) tablet 650 mg  650 mg Oral Q6H PRN Dessa Phi, DO   650  mg at 05/11/18 0346   Or  . acetaminophen (TYLENOL) suppository 650 mg  650 mg Rectal Q6H PRN Dessa Phi, DO      . amLODipine (NORVASC) tablet 10 mg  10 mg Oral Daily Dessa Phi, DO   10 mg at 05/11/18 1017  . apixaban (ELIQUIS) tablet 2.5 mg  2.5 mg Oral BID Vernell Leep D, MD   2.5 mg at 05/11/18 1018  . atorvastatin (LIPITOR) tablet 80 mg  80 mg Oral q1800 Dessa Phi, DO   80 mg at 05/10/18 1735  . collagenase (SANTYL) ointment   Topical Daily Raiford Noble Latif, [place]      . ferrous sulfate tablet 325 mg  325 mg Oral Q breakfast Dessa Phi, DO   325 mg at 05/11/18 1017  . [START ON 05/12/2018] furosemide (LASIX) tablet 40 mg  40 mg Oral Daily Hongalgi, Anand D, MD      . insulin aspart (novoLOG) injection 0-15 Units  0-15 Units Subcutaneous TID WC Hongalgi, Lenis Dickinson, MD   8 Units at 05/11/18 0700  . insulin aspart (novoLOG) injection 0-5 Units  0-5 Units Subcutaneous QHS Modena Jansky, MD   4 Units at 05/10/18 2244  . insulin NPH Human (HUMULIN N,NOVOLIN N) injection 10 Units  10 Units Subcutaneous QHS Dessa Phi, DO   10 Units at 05/10/18 2243  . insulin NPH Human (HUMULIN N,NOVOLIN N) injection 20 Units  20 Units Subcutaneous QAC breakfast Modena Jansky, MD   20 Units at 05/11/18 1020  . metoprolol tartrate (LOPRESSOR) tablet 25 mg  25 mg Oral BID Dessa Phi, DO   25 mg at 05/11/18 1018  . ondansetron (ZOFRAN) tablet 4 mg  4 mg Oral Q6H PRN Dessa Phi, DO       Or  . ondansetron [facility]) injection 4 mg  4 mg Intravenous Q6H PRN Dessa Phi, DO      . pantoprazole (PROTONIX) EC tablet 40 mg  40 mg Oral Daily Dessa Phi, DO   40 mg at 05/11/18 1017  . predniSONE (DELTASONE) tablet 50 mg  50 mg Oral Q breakfast Dessa Phi, DO   50 mg at 05/11/18 1018  . senna (SENOKOT) tablet 8.6 mg  1 tablet Oral PRN Dessa Phi, DO         Discharge Medications: Please see discharge summary for a list of discharge medications.  Relevant Imaging  Results:  Relevant Lab Results:   Additional Information SS#: [id number]  Geralynn Ochs, LCSW

## 2018-05-12 ENCOUNTER — Ambulatory Visit (HOSPITAL_COMMUNITY): Payer: Medicare Other

## 2018-05-15 ENCOUNTER — Ambulatory Visit: Payer: Medicare Other | Admitting: Cardiology

## 2018-05-15 ENCOUNTER — Ambulatory Visit (HOSPITAL_COMMUNITY): Payer: Medicare Other

## 2018-05-15 ENCOUNTER — Inpatient Hospital Stay (HOSPITAL_COMMUNITY)
Admission: EM | Admit: 2018-05-15 | Discharge: 2018-05-19 | DRG: 177 | Disposition: A | Discharge status: Hospice | Payer: Medicare HMO | Attending: Internal Medicine | Admitting: Internal Medicine

## 2018-05-15 ENCOUNTER — Emergency Department (HOSPITAL_COMMUNITY): Payer: Medicare HMO

## 2018-05-15 ENCOUNTER — Encounter (HOSPITAL_COMMUNITY): Payer: Self-pay | Admitting: Emergency Medicine

## 2018-05-15 ENCOUNTER — Other Ambulatory Visit: Payer: Self-pay

## 2018-05-15 DIAGNOSIS — R531 Weakness: Secondary | ICD-10-CM

## 2018-05-15 DIAGNOSIS — Z66 Do not resuscitate: Secondary | ICD-10-CM | POA: Diagnosis present

## 2018-05-15 DIAGNOSIS — I350 Nonrheumatic aortic (valve) stenosis: Secondary | ICD-10-CM

## 2018-05-15 DIAGNOSIS — E1122 Type 2 diabetes mellitus with diabetic chronic kidney disease: Secondary | ICD-10-CM | POA: Diagnosis present

## 2018-05-15 DIAGNOSIS — I252 Old myocardial infarction: Secondary | ICD-10-CM | POA: Diagnosis not present

## 2018-05-15 DIAGNOSIS — R0902 Hypoxemia: Secondary | ICD-10-CM

## 2018-05-15 DIAGNOSIS — I130 Hypertensive heart and chronic kidney disease with heart failure and stage 1 through stage 4 chronic kidney disease, or unspecified chronic kidney disease: Secondary | ICD-10-CM | POA: Diagnosis present

## 2018-05-15 DIAGNOSIS — I10 Essential (primary) hypertension: Secondary | ICD-10-CM | POA: Diagnosis present

## 2018-05-15 DIAGNOSIS — I2510 Atherosclerotic heart disease of native coronary artery without angina pectoris: Secondary | ICD-10-CM | POA: Diagnosis present

## 2018-05-15 DIAGNOSIS — J189 Pneumonia, unspecified organism: Secondary | ICD-10-CM | POA: Diagnosis present

## 2018-05-15 DIAGNOSIS — Z953 Presence of xenogenic heart valve: Secondary | ICD-10-CM

## 2018-05-15 DIAGNOSIS — E872 Acidosis: Secondary | ICD-10-CM | POA: Diagnosis present

## 2018-05-15 DIAGNOSIS — E785 Hyperlipidemia, unspecified: Secondary | ICD-10-CM | POA: Diagnosis present

## 2018-05-15 DIAGNOSIS — K219 Gastro-esophageal reflux disease without esophagitis: Secondary | ICD-10-CM | POA: Diagnosis present

## 2018-05-15 DIAGNOSIS — Z79899 Other long term (current) drug therapy: Secondary | ICD-10-CM

## 2018-05-15 DIAGNOSIS — I5023 Acute on chronic systolic (congestive) heart failure: Secondary | ICD-10-CM | POA: Diagnosis not present

## 2018-05-15 DIAGNOSIS — Z515 Encounter for palliative care: Secondary | ICD-10-CM | POA: Diagnosis not present

## 2018-05-15 DIAGNOSIS — E1129 Type 2 diabetes mellitus with other diabetic kidney complication: Secondary | ICD-10-CM | POA: Diagnosis present

## 2018-05-15 DIAGNOSIS — Z833 Family history of diabetes mellitus: Secondary | ICD-10-CM

## 2018-05-15 DIAGNOSIS — I482 Chronic atrial fibrillation, unspecified: Secondary | ICD-10-CM | POA: Diagnosis not present

## 2018-05-15 DIAGNOSIS — E1165 Type 2 diabetes mellitus with hyperglycemia: Secondary | ICD-10-CM | POA: Diagnosis present

## 2018-05-15 DIAGNOSIS — D631 Anemia in chronic kidney disease: Secondary | ICD-10-CM | POA: Diagnosis present

## 2018-05-15 DIAGNOSIS — Z952 Presence of prosthetic heart valve: Secondary | ICD-10-CM | POA: Diagnosis not present

## 2018-05-15 DIAGNOSIS — M797 Fibromyalgia: Secondary | ICD-10-CM | POA: Diagnosis present

## 2018-05-15 DIAGNOSIS — Z794 Long term (current) use of insulin: Secondary | ICD-10-CM

## 2018-05-15 DIAGNOSIS — I69320 Aphasia following cerebral infarction: Secondary | ICD-10-CM | POA: Diagnosis not present

## 2018-05-15 DIAGNOSIS — Z22322 Carrier or suspected carrier of Methicillin resistant Staphylococcus aureus: Secondary | ICD-10-CM

## 2018-05-15 DIAGNOSIS — B379 Candidiasis, unspecified: Secondary | ICD-10-CM | POA: Diagnosis present

## 2018-05-15 DIAGNOSIS — I69351 Hemiplegia and hemiparesis following cerebral infarction affecting right dominant side: Secondary | ICD-10-CM | POA: Diagnosis not present

## 2018-05-15 DIAGNOSIS — I639 Cerebral infarction, unspecified: Secondary | ICD-10-CM | POA: Diagnosis not present

## 2018-05-15 DIAGNOSIS — Z7189 Other specified counseling: Secondary | ICD-10-CM | POA: Diagnosis not present

## 2018-05-15 DIAGNOSIS — R0602 Shortness of breath: Secondary | ICD-10-CM | POA: Diagnosis present

## 2018-05-15 DIAGNOSIS — Z8614 Personal history of Methicillin resistant Staphylococcus aureus infection: Secondary | ICD-10-CM

## 2018-05-15 DIAGNOSIS — Y95 Nosocomial condition: Secondary | ICD-10-CM | POA: Diagnosis present

## 2018-05-15 DIAGNOSIS — E1151 Type 2 diabetes mellitus with diabetic peripheral angiopathy without gangrene: Secondary | ICD-10-CM | POA: Diagnosis not present

## 2018-05-15 DIAGNOSIS — I6340 Cerebral infarction due to embolism of unspecified cerebral artery: Secondary | ICD-10-CM | POA: Diagnosis present

## 2018-05-15 DIAGNOSIS — J690 Pneumonitis due to inhalation of food and vomit: Secondary | ICD-10-CM | POA: Diagnosis present

## 2018-05-15 DIAGNOSIS — Z955 Presence of coronary angioplasty implant and graft: Secondary | ICD-10-CM

## 2018-05-15 DIAGNOSIS — Z961 Presence of intraocular lens: Secondary | ICD-10-CM | POA: Diagnosis present

## 2018-05-15 DIAGNOSIS — N184 Chronic kidney disease, stage 4 (severe): Secondary | ICD-10-CM | POA: Diagnosis not present

## 2018-05-15 DIAGNOSIS — G9341 Metabolic encephalopathy: Secondary | ICD-10-CM | POA: Diagnosis present

## 2018-05-15 DIAGNOSIS — G9340 Encephalopathy, unspecified: Secondary | ICD-10-CM | POA: Diagnosis not present

## 2018-05-15 DIAGNOSIS — Z9842 Cataract extraction status, left eye: Secondary | ICD-10-CM

## 2018-05-15 DIAGNOSIS — I480 Paroxysmal atrial fibrillation: Secondary | ICD-10-CM | POA: Diagnosis present

## 2018-05-15 DIAGNOSIS — H548 Legal blindness, as defined in USA: Secondary | ICD-10-CM | POA: Diagnosis present

## 2018-05-15 DIAGNOSIS — Z9841 Cataract extraction status, right eye: Secondary | ICD-10-CM

## 2018-05-15 DIAGNOSIS — Z888 Allergy status to other drugs, medicaments and biological substances status: Secondary | ICD-10-CM

## 2018-05-15 DIAGNOSIS — G931 Anoxic brain damage, not elsewhere classified: Secondary | ICD-10-CM | POA: Diagnosis present

## 2018-05-15 DIAGNOSIS — I509 Heart failure, unspecified: Secondary | ICD-10-CM

## 2018-05-15 DIAGNOSIS — N179 Acute kidney failure, unspecified: Secondary | ICD-10-CM | POA: Diagnosis not present

## 2018-05-15 DIAGNOSIS — Z885 Allergy status to narcotic agent status: Secondary | ICD-10-CM

## 2018-05-15 DIAGNOSIS — I5043 Acute on chronic combined systolic (congestive) and diastolic (congestive) heart failure: Secondary | ICD-10-CM | POA: Diagnosis present

## 2018-05-15 DIAGNOSIS — Z87891 Personal history of nicotine dependence: Secondary | ICD-10-CM

## 2018-05-15 DIAGNOSIS — Z7901 Long term (current) use of anticoagulants: Secondary | ICD-10-CM

## 2018-05-15 DIAGNOSIS — Z951 Presence of aortocoronary bypass graft: Secondary | ICD-10-CM

## 2018-05-15 LAB — COMPREHENSIVE METABOLIC PANEL
ALK PHOS: 142 U/L — AB (ref 38–126)
ALT: 38 U/L (ref 0–44)
AST: 43 U/L — ABNORMAL HIGH (ref 15–41)
Albumin: 2.3 g/dL — ABNORMAL LOW (ref 3.5–5.0)
Anion gap: 10 (ref 5–15)
BUN: 98 mg/dL — ABNORMAL HIGH (ref 8–23)
CALCIUM: 7.7 mg/dL — AB (ref 8.9–10.3)
CO2: 19 mmol/L — ABNORMAL LOW (ref 22–32)
Chloride: 105 mmol/L (ref 98–111)
Creatinine, Ser: 2.04 mg/dL — ABNORMAL HIGH (ref 0.44–1.00)
GFR calc Af Amer: 26 mL/min — ABNORMAL LOW (ref >60)
GFR calc non Af Amer: 22 mL/min — ABNORMAL LOW (ref >60)
Glucose, Bld: 217 mg/dL — ABNORMAL HIGH (ref 70–99)
Potassium: 4.6 mmol/L (ref 3.5–5.1)
Sodium: 134 mmol/L — ABNORMAL LOW (ref 135–145)
Total Bilirubin: 2.9 mg/dL — ABNORMAL HIGH (ref 0.3–1.2)
Total Protein: 6 g/dL — ABNORMAL LOW (ref 6.5–8.1)

## 2018-05-15 LAB — MAGNESIUM: Magnesium: 2.6 mg/dL — ABNORMAL HIGH (ref 1.7–2.4)

## 2018-05-15 LAB — CBC WITH DIFFERENTIAL/PLATELET
Abs Immature Granulocytes: 0.09 10*3/uL — ABNORMAL HIGH (ref 0.00–0.07)
Basophils Absolute: 0 10*3/uL (ref 0.0–0.1)
Basophils Relative: 0 %
Eosinophils Absolute: 0 10*3/uL (ref 0.0–0.5)
Eosinophils Relative: 0 %
HCT: 27.8 % — ABNORMAL LOW (ref 36.0–46.0)
Hemoglobin: 8.3 g/dL — ABNORMAL LOW (ref 12.0–15.0)
Immature Granulocytes: 1 %
Lymphocytes Relative: 1 %
Lymphs Abs: 0.2 10*3/uL — ABNORMAL LOW (ref 0.7–4.0)
MCH: 27.8 pg (ref 26.0–34.0)
MCHC: 29.9 g/dL — AB (ref 30.0–36.0)
MCV: 93 fL (ref 80.0–100.0)
MONOS PCT: 1 %
Monocytes Absolute: 0.2 10*3/uL (ref 0.1–1.0)
Neutro Abs: 12 10*3/uL — ABNORMAL HIGH (ref 1.7–7.7)
Neutrophils Relative %: 97 %
Platelets: 120 10*3/uL — ABNORMAL LOW (ref 150–400)
RBC: 2.99 MIL/uL — ABNORMAL LOW (ref 3.87–5.11)
RDW: 20.8 % — ABNORMAL HIGH (ref 11.5–15.5)
WBC: 12.4 10*3/uL — ABNORMAL HIGH (ref 4.0–10.5)
nRBC: 0 % (ref 0.0–0.2)

## 2018-05-15 LAB — PROCALCITONIN: Procalcitonin: 0.31 ng/mL

## 2018-05-15 LAB — LACTIC ACID, PLASMA
Lactic Acid, Venous: 1.2 mmol/L (ref 0.5–1.9)
Lactic Acid, Venous: 1.9 mmol/L (ref 0.5–1.9)

## 2018-05-15 MED ORDER — FENTANYL CITRATE (PF) 100 MCG/2ML IJ SOLN
50.0000 ug | Freq: Once | INTRAMUSCULAR | Status: AC
  Administered 2018-05-15: 50 ug via INTRAVENOUS
  Filled 2018-05-15: qty 2

## 2018-05-15 MED ORDER — LEVALBUTEROL HCL 0.63 MG/3ML IN NEBU
0.6300 mg | INHALATION_SOLUTION | Freq: Four times a day (QID) | RESPIRATORY_TRACT | Status: DC
  Administered 2018-05-15: 0.63 mg via RESPIRATORY_TRACT
  Filled 2018-05-15: qty 3

## 2018-05-15 MED ORDER — LEVALBUTEROL HCL 0.63 MG/3ML IN NEBU
0.6300 mg | INHALATION_SOLUTION | Freq: Three times a day (TID) | RESPIRATORY_TRACT | Status: DC
  Administered 2018-05-16 – 2018-05-18 (×7): 0.63 mg via RESPIRATORY_TRACT
  Filled 2018-05-15 (×7): qty 3

## 2018-05-15 MED ORDER — ACETAMINOPHEN 325 MG PO TABS
650.0000 mg | ORAL_TABLET | Freq: Four times a day (QID) | ORAL | Status: DC | PRN
  Administered 2018-05-17 – 2018-05-18 (×2): 650 mg via ORAL
  Filled 2018-05-15 (×2): qty 2

## 2018-05-15 MED ORDER — VANCOMYCIN HCL 10 G IV SOLR
1250.0000 mg | INTRAVENOUS | Status: DC
  Filled 2018-05-15: qty 1250

## 2018-05-15 MED ORDER — BISACODYL 10 MG RE SUPP: 10.0000 mg | Freq: Every day | RECTAL | Status: DC | PRN

## 2018-05-15 MED ORDER — SODIUM CHLORIDE 0.9% FLUSH
3.0000 mL | Freq: Once | INTRAVENOUS | Status: AC
  Administered 2018-05-15: 3 mL via INTRAVENOUS

## 2018-05-15 MED ORDER — METHYLPREDNISOLONE SODIUM SUCC 40 MG IJ SOLR
40.0000 mg | Freq: Every day | INTRAMUSCULAR | Status: DC
  Administered 2018-05-15 – 2018-05-17 (×3): 40 mg via INTRAVENOUS
  Filled 2018-05-15 (×3): qty 1

## 2018-05-15 MED ORDER — INSULIN ASPART 100 UNIT/ML ~~LOC~~ SOLN
0.0000 [IU] | Freq: Four times a day (QID) | SUBCUTANEOUS | Status: DC
  Administered 2018-05-16: 3 [IU] via SUBCUTANEOUS
  Administered 2018-05-16: 5 [IU] via SUBCUTANEOUS
  Administered 2018-05-16 – 2018-05-17 (×3): 3 [IU] via SUBCUTANEOUS
  Administered 2018-05-17: 5 [IU] via SUBCUTANEOUS
  Administered 2018-05-17 (×2): 3 [IU] via SUBCUTANEOUS
  Administered 2018-05-18: 14 [IU] via SUBCUTANEOUS
  Administered 2018-05-18: 5 [IU] via SUBCUTANEOUS
  Administered 2018-05-18: 15 [IU] via SUBCUTANEOUS
  Administered 2018-05-18: 7 [IU] via SUBCUTANEOUS
  Administered 2018-05-19: 2 [IU] via SUBCUTANEOUS
  Administered 2018-05-19: 3 [IU] via SUBCUTANEOUS
  Administered 2018-05-19: 2 [IU] via SUBCUTANEOUS

## 2018-05-15 MED ORDER — ACETAMINOPHEN 650 MG RE SUPP: 650.0000 mg | Freq: Four times a day (QID) | RECTAL | Status: DC | PRN

## 2018-05-15 MED ORDER — FUROSEMIDE 10 MG/ML IJ SOLN
20.0000 mg | Freq: Every day | INTRAMUSCULAR | Status: DC
  Administered 2018-05-15: 20 mg via INTRAVENOUS
  Filled 2018-05-15: qty 2

## 2018-05-15 MED ORDER — VANCOMYCIN HCL 10 G IV SOLR
1500.0000 mg | Freq: Once | INTRAVENOUS | Status: AC
  Administered 2018-05-15: 1500 mg via INTRAVENOUS
  Filled 2018-05-15: qty 1500

## 2018-05-15 MED ORDER — INSULIN ASPART 100 UNIT/ML ~~LOC~~ SOLN: 0.0000 [IU] | Freq: Three times a day (TID) | SUBCUTANEOUS | Status: DC

## 2018-05-15 MED ORDER — ONDANSETRON HCL 4 MG/2ML IJ SOLN
4.0000 mg | Freq: Four times a day (QID) | INTRAMUSCULAR | Status: DC | PRN
  Filled 2018-05-15: qty 2

## 2018-05-15 MED ORDER — CLINDAMYCIN PHOSPHATE 600 MG/50ML IV SOLN
600.0000 mg | Freq: Once | INTRAVENOUS | Status: AC
  Administered 2018-05-15: 600 mg via INTRAVENOUS
  Filled 2018-05-15: qty 50

## 2018-05-15 MED ORDER — FLEET ENEMA 7-19 GM/118ML RE ENEM: 1.0000 | ENEMA | Freq: Once | RECTAL | Status: DC | PRN

## 2018-05-15 MED ORDER — HYDROMORPHONE HCL 1 MG/ML IJ SOLN
0.5000 mg | INTRAMUSCULAR | Status: DC | PRN
  Administered 2018-05-18 – 2018-05-19 (×2): 0.5 mg via INTRAVENOUS
  Filled 2018-05-15 (×2): qty 0.5

## 2018-05-15 MED ORDER — PANTOPRAZOLE SODIUM 40 MG IV SOLR
40.0000 mg | INTRAVENOUS | Status: DC
  Administered 2018-05-15 – 2018-05-18 (×4): 40 mg via INTRAVENOUS
  Filled 2018-05-15 (×4): qty 40

## 2018-05-15 MED ORDER — SODIUM CHLORIDE 0.9 % IV SOLN
250.0000 mL | INTRAVENOUS | Status: DC | PRN
  Administered 2018-05-18: 250 mL via INTRAVENOUS

## 2018-05-15 MED ORDER — METOPROLOL TARTRATE 5 MG/5ML IV SOLN
5.0000 mg | Freq: Four times a day (QID) | INTRAVENOUS | Status: DC
  Administered 2018-05-15 – 2018-05-19 (×15): 5 mg via INTRAVENOUS
  Filled 2018-05-15 (×15): qty 5

## 2018-05-15 MED ORDER — APIXABAN 2.5 MG PO TABS: 2.5000 mg | ORAL_TABLET | Freq: Two times a day (BID) | ORAL | Status: DC

## 2018-05-15 MED ORDER — SODIUM CHLORIDE 0.9% FLUSH
3.0000 mL | Freq: Two times a day (BID) | INTRAVENOUS | Status: DC
  Administered 2018-05-16 – 2018-05-18 (×2): 3 mL via INTRAVENOUS

## 2018-05-15 MED ORDER — SODIUM CHLORIDE 0.9 % IV SOLN
2.0000 g | Freq: Once | INTRAVENOUS | Status: AC
  Administered 2018-05-15: 2 g via INTRAVENOUS
  Filled 2018-05-15: qty 2

## 2018-05-15 MED ORDER — SODIUM CHLORIDE 0.9% FLUSH
3.0000 mL | Freq: Two times a day (BID) | INTRAVENOUS | Status: DC
  Administered 2018-05-16 – 2018-05-18 (×4): 3 mL via INTRAVENOUS

## 2018-05-15 MED ORDER — LEVALBUTEROL HCL 0.63 MG/3ML IN NEBU: 0.6300 mg | INHALATION_SOLUTION | RESPIRATORY_TRACT | Status: DC | PRN

## 2018-05-15 MED ORDER — ONDANSETRON HCL 4 MG PO TABS: 4.0000 mg | ORAL_TABLET | Freq: Four times a day (QID) | ORAL | Status: DC | PRN

## 2018-05-15 MED ORDER — SODIUM CHLORIDE 0.9 % IV BOLUS
500.0000 mL | Freq: Once | INTRAVENOUS | Status: AC
  Administered 2018-05-15: 500 mL via INTRAVENOUS

## 2018-05-15 MED ORDER — PIPERACILLIN-TAZOBACTAM 3.375 G IVPB
3.3750 g | Freq: Three times a day (TID) | INTRAVENOUS | Status: DC
  Administered 2018-05-16 – 2018-05-19 (×11): 3.375 g via INTRAVENOUS
  Filled 2018-05-15 (×11): qty 50

## 2018-05-15 MED ORDER — POLYETHYLENE GLYCOL 3350 17 G PO PACK: 17.0000 g | PACK | Freq: Every day | ORAL | Status: DC | PRN

## 2018-05-15 MED ORDER — SODIUM CHLORIDE 0.9% FLUSH: 3.0000 mL | INTRAVENOUS | Status: DC | PRN

## 2018-05-15 MED ORDER — COLLAGENASE 250 UNIT/GM EX OINT
1.0000 "application " | TOPICAL_OINTMENT | Freq: Every day | CUTANEOUS | Status: DC
  Administered 2018-05-16 – 2018-05-18 (×3): 1 via TOPICAL
  Filled 2018-05-15: qty 30

## 2018-05-15 NOTE — ED Notes (Signed)
ED TO INPATIENT HANDOFF REPORT  Name/Age/Gender Kristina Summers 83 y.o. female  Code Status    Code Status Orders  (From admission, onward)         Start     Ordered   05/15/18 1744  Do not attempt resuscitation/DNR  Continuous    Question Answer Comment  In the event of cardiac or respiratory ARREST Do not call a "code blue"   In the event of cardiac or respiratory ARREST Do not perform Intubation, CPR, defibrillation or ACLS   In the event of cardiac or respiratory ARREST Use medication by any route, position, wound care, and other measures to relive pain and suffering. May use oxygen, suction and manual treatment of airway obstruction as needed for comfort.      05/15/18 1744        Code Status History    Date Active Date Inactive Code Status Order ID Comments User Context   05/15/2018 1744 05/15/2018 1744 DNR [id number]  Eugenie Filler, MD ED   05/08/2018 1730 05/11/2018 2122 DNR [id number]  Dessa Phi, DO ED   04/18/2018 1020 04/21/2018 1732 DNR [id number]  Karmen Bongo, MD ED   04/10/2018 1910 04/15/2018 1907 DNR [id number]  Guilford Shi, MD ED   04/10/2018 1833 04/10/2018 1910 Full Code [id number]  [place]ford Shi, MD ED   03/20/2018 2037 03/23/2018 1819 DNR [id number]  Norval Morton, MD ED   03/13/2018 1457 03/18/2018 1556 DNR [id number]  Karmen Bongo, MD ED   12/22/2017 2104 12/26/2017 1608 Full Code [id number]  Cristy Folks, MD Inpatient   12/11/2017 1937 12/16/2017 1656 DNR [id number]  Ivor Costa, MD ED   11/08/2017 0942 11/10/2017 1500 Full Code [id number]  Eileen Stanford, PA-C Inpatient   10/11/2017 1834 10/12/2017 1729 Full Code [id number]  Sherren Mocha, MD Inpatient   08/25/2017 1500 08/29/2017 1357 DNR [id number]  Vita Erm, NP Inpatient   06/10/2017 1951 06/13/2017 1643 DNR [id number]  Ivor Costa, MD ED   06/10/2017 1924 06/10/2017 1951 Full Code [id number]  Ivor Costa, MD ED   01/28/2017 1147 01/30/2017 1921 Full Code [id number]  Roxan Hockey, MD  ED   05/29/2014 1506 06/03/2014 1700 Full Code [id number]  Bonnielee Haff, MD Inpatient   08/23/2013 1723 08/27/2013 1412 Full Code [id number]  Kelvin Cellar, MD ED   11/18/2011 1755 11/19/2011 1232 Full Code [id number]  Laurine Blazer, RN Inpatient   06/17/2011 2135 06/18/2011 1850 Full Code [id number]  Lasandra Beech, RN Inpatient    Advance Directive Documentation     Most Recent Value  Type of Advance Directive  Healthcare Power of Attorney [daughter states pt has a most form]  Pre-existing out of facility DNR order (yellow form or pink MOST form)  -  "MOST" Form in Place?  -      Home/SNF/Other Nursing Home  Chief Complaint failure to thrive  Level of Care/Admitting Diagnosis ED Disposition    ED Disposition Condition [place]: [facility] [100102]  Level of Care: Telemetry [5]  Admit to tele based on following criteria: Complex arrhythmia (Bradycardia/Tachycardia)  Diagnosis: Acute encephalopathy [263785]  Admitting Physician: Eugenie Filler [[id number]]  Attending Physician: Eugenie Filler [[id number]]  Estimated length of stay: past midnight tomorrow  Certification:: I certify this patient will need inpatient services for at least 2 midnights  PT Class (Do Not Modify): Inpatient [101]  PT Acc Code (Do Not Modify): Private [1]       Medical History  Past Medical History:  Diagnosis Date  . Anemia   . CAD (coronary artery disease)    a. s/p CABG in 2000  . Carotid artery occlusion   . CKD (chronic kidney disease)   . Diastolic CHF ([place]) 44/3154  . Fibromyalgia   . GERD (gastroesophageal reflux disease)   . History of CVA (cerebrovascular accident) 05/24/2014  . History of GI bleed   . Hyperlipidemia   . Hypertension   . Obesity   . PAF (paroxysmal atrial fibrillation) (HCC)    a. on Eliquis  . Peripheral vascular disease ([place])   . S/P TAVR (transcatheter aortic valve replacement) 11/08/2017   26 mm Edwards Sapien 3 transcatheter  heart valve placed via percutaneous left transfemoral approach   . Type II diabetes mellitus ([place]) dx'd 1979    Allergies Allergies  Allergen Reactions  . Morphine And Related Nausea And Vomiting and Other (See Comments)    Chest pain, also   . Lisinopril Cough  . Zoloft [Sertraline Hcl] Nausea Only         IV Location/Drains/Wounds Patient Lines/Drains/Airways Status   Active Line/Drains/Airways    Name:   Placement date:   Placement time:   Site:   Days:   Peripheral IV 05/15/18 Left Antecubital   05/15/18    1451    Antecubital   less than 1   External Urinary Catheter   05/08/18    1431    -   7   Wound / Incision (Open or Dehisced) 03/13/18 Diabetic ulcer Foot Right;Posterior   03/13/18    2110    Foot   63          Labs/Imaging Results for orders placed or performed during the hospital encounter of 05/15/18 (from the past 48 hour(s))  Comprehensive metabolic panel     Status: Abnormal   Collection Time: 05/15/18  2:50 PM  Result Value Ref Range   Sodium 134 (L) 135 - 145 mmol/L   Potassium 4.6 3.5 - 5.1 mmol/L   Chloride 105 98 - 111 mmol/L   CO2 19 (L) 22 - 32 mmol/L   Glucose, Bld 217 (H) 70 - 99 mg/dL   BUN 98 (H) 8 - 23 mg/dL   Creatinine, Ser 2.04 (H) 0.44 - 1.00 mg/dL   Calcium 7.7 (L) 8.9 - 10.3 mg/dL   Total Protein 6.0 (L) 6.5 - 8.1 g/dL   Albumin 2.3 (L) 3.5 - 5.0 g/dL   AST 43 (H) 15 - 41 U/L   ALT 38 0 - 44 U/L   Alkaline Phosphatase 142 (H) 38 - 126 U/L   Total Bilirubin 2.9 (H) 0.3 - 1.2 mg/dL   GFR calc non Af Amer 22 (L) >60 mL/min   GFR calc Af Amer 26 (L) >60 mL/min   Anion gap 10 5 - 15    Comment: Performed at [facility], [place] [address], [place], [place] [address]  CBC with Differential     Status: Abnormal   Collection Time: 05/15/18  2:50 PM  Result Value Ref Range   WBC 12.4 (H) 4.0 - 10.5 K/uL   RBC 2.99 (L) 3.87 - 5.11 MIL/uL   Hemoglobin 8.3 (L) 12.0 - 15.0 g/dL   HCT 27.8 (L) 36.0 - 46.0 %   MCV 93.0 80.0  - 100.0 fL   MCH 27.8 26.0 - 34.0 pg   MCHC 29.9 (L) 30.0 - 36.0 g/dL   RDW 20.8 (H) 11.5 - 15.5 %   Platelets  120 (L) 150 - 400 K/uL   nRBC 0.0 0.0 - 0.2 %   Neutrophils Relative % 97 %   Neutro Abs 12.0 (H) 1.7 - 7.7 K/uL   Lymphocytes Relative 1 %   Lymphs Abs 0.2 (L) 0.7 - 4.0 K/uL   Monocytes Relative 1 %   Monocytes Absolute 0.2 0.1 - 1.0 K/uL   Eosinophils Relative 0 %   Eosinophils Absolute 0.0 0.0 - 0.5 K/uL   Basophils Relative 0 %   Basophils Absolute 0.0 0.0 - 0.1 K/uL   Immature Granulocytes 1 %   Abs Immature Granulocytes 0.09 (H) 0.00 - 0.07 K/uL    Comment: Performed at [facility], [place] [address], [place], [place] [address]  Lactic acid, plasma     Status: None   Collection Time: 05/15/18  2:55 PM  Result Value Ref Range   Lactic Acid, Venous 1.2 0.5 - 1.9 mmol/L    Comment: Performed at [facility], [place] [address] [place], [place] [address]   Dg Chest 1 View  Result Date: 05/15/2018 CLINICAL DATA:  Shortness of Breath EXAM: CHEST  1 VIEW COMPARISON:  04/10/2018 FINDINGS: Cardiac shadow is mildly enlarged. Changes of coronary bypass grafting and prior TAVR are noted. Increased central vascular congestion is noted with patchy infiltrate most prominent within the midportion of the right lung. Small right-sided pleural effusion is noted. No acute bony abnormality is seen. Chronic changes in the proximal right humerus are noted. IMPRESSION: Vascular congestion increased from the prior exam. Patchy infiltrative opacities are noted bilaterally worst in the midportion of the right lung. Electronically Signed   By: Inez Catalina M.D.   On: 05/15/2018 15:51   Ct Head Wo Contrast  Result Date: 05/15/2018 CLINICAL DATA:  Acute altered level of consciousness. Stroke 1 week ago. The EXAM: CT HEAD WITHOUT CONTRAST TECHNIQUE: Contiguous axial images were obtained from the base of the skull through the vertex without intravenous contrast.  COMPARISON:  CT head without contrast 05/08/2018. MRI brain without contrast 05/08/2018 FINDINGS: Brain: A progressive medial left frontal lobe infarct is present. This is just anterior and medial to the areas of restricted diffusion on the most recent MRI scan. More remote encephalomalacia is present inferiorly in the left frontal lobe. The subacute posterior left frontal lobe infarct is also noted with some suggestion of cortical laminar necrosis. White matter changes are present on the right without cortical infarct. Basal ganglia are intact. The brainstem and cerebellum are within normal limits. Vascular: Dense atherosclerotic calcifications are present within the cavernous internal carotid arteries bilaterally and at the dural margin of both vertebral arteries. No hyperdense vessel is present. Skull: Calvarium is intact. No focal lytic or blastic lesions are present. Sinuses/Orbits: The paranasal sinuses and mastoid air cells are clear. Bilateral lens replacements are present. Globes and orbits are within normal limits. IMPRESSION: 1. Progressive medial anterior left frontal lobe infarct, likely within the ACA territory. 2. Subacute posterior left frontal lobe infarct. 3. Stable remote left frontal lobe encephalomalacia. 4. Atherosclerosis. Electronically Signed   By: [place] Morelle M.D.   On: 05/15/2018 16:05    Pending Labs Unresulted Labs (From admission, onward)    Start     Ordered   05/16/18 0500  Procalcitonin  Daily,   R     05/15/18 1730   05/16/18 8676  Basic metabolic panel  Tomorrow morning,   R     05/15/18 1743   05/16/18 0500  CBC  Tomorrow morning,  R     05/15/18 1743   05/16/18 0500  Protime-INR  Tomorrow morning,   R     05/15/18 1743   05/16/18 0500  Creatinine, serum  Daily,   R     05/15/18 1807   05/15/18 1800  HIV Antibody (routine testing w rflx)  Once,   R     05/15/18 1800   05/15/18 1739  Magnesium  Add-on,   R     05/15/18 1743   05/15/18 1732  Legionella  Pneumophila Serogp 1 Ur Ag  Once,   R     05/15/18 1732   05/15/18 1731  Procalcitonin - Baseline  ONCE - STAT,   STAT     05/15/18 1730   05/15/18 1731  Culture, sputum-assessment  Once,   R     05/15/18 1732   05/15/18 1731  Gram stain  Once,   R     05/15/18 1732   05/15/18 1731  Strep pneumoniae urinary antigen  Once,   R     05/15/18 1732   05/15/18 1730  Culture, Urine  Once,   R     05/15/18 1729   05/15/18 1529  Blood culture (routine x 2)  BLOOD CULTURE X 2,   STAT     05/15/18 1528   05/15/18 1447  Lactic acid, plasma  Now then every 2 hours,   STAT     05/15/18 1447   05/15/18 1447  Urinalysis, Routine w reflex microscopic  ONCE - STAT,   STAT     05/15/18 1447          Vitals/Pain Today's Vitals   05/15/18 1630 05/15/18 1700 05/15/18 1730 05/15/18 1800  BP: (!) 159/62 (!) 160/54 (!) 164/55 (!) 168/66  Pulse: 69 69 73 73  Resp: 17 15 13 14   Temp:      TempSrc:      SpO2: 94% 96% 95% 95%    Isolation Precautions No active isolations  Medications Medications  vancomycin (VANCOCIN) 1,500 mg in sodium chloride 0.9 % 500 mL IVPB (has no administration in time range)  apixaban (ELIQUIS) tablet 2.5 mg (has no administration in time range)  collagenase (SANTYL) ointment 1 application (has no administration in time range)  sodium chloride flush (NS) 0.9 % injection 3 mL (has no administration in time range)  sodium chloride flush (NS) 0.9 % injection 3 mL (has no administration in time range)  sodium chloride flush (NS) 0.9 % injection 3 mL (has no administration in time range)  0.9 %  sodium chloride infusion (has no administration in time range)  acetaminophen (TYLENOL) tablet 650 mg (has no administration in time range)    Or  acetaminophen (TYLENOL) suppository 650 mg (has no administration in time range)  polyethylene glycol (MIRALAX / GLYCOLAX) packet 17 g (has no administration in time range)  bisacodyl (DULCOLAX) suppository 10 mg (has no administration in  time range)  sodium phosphate (FLEET) 7-19 GM/118ML enema 1 enema (has no administration in time range)  ondansetron (ZOFRAN) tablet 4 mg (has no administration in time range)    Or  ondansetron (ZOFRAN) injection 4 mg (has no administration in time range)  levalbuterol (XOPENEX) nebulizer solution 0.63 mg (has no administration in time range)  levalbuterol (XOPENEX) nebulizer solution 0.63 mg (has no administration in time range)  furosemide (LASIX) injection 20 mg (has no administration in time range)  pantoprazole (PROTONIX) injection 40 mg (has no administration in time range)  metoprolol tartrate (LOPRESSOR)  injection 5 mg (has no administration in time range)  HYDROmorphone (DILAUDID) injection 0.5 mg (has no administration in time range)  piperacillin-tazobactam (ZOSYN) IVPB 3.375 g (has no administration in time range)  vancomycin (VANCOCIN) 1,250 mg in sodium chloride 0.9 % 250 mL IVPB (has no administration in time range)  sodium chloride flush (NS) 0.9 % injection 3 mL (3 mLs Intravenous Given 05/15/18 1505)  sodium chloride 0.9 % bolus 500 mL (0 mLs Intravenous Stopped 05/15/18 1640)  fentaNYL (SUBLIMAZE) injection 50 mcg (50 mcg Intravenous Given 05/15/18 1616)  ceFEPIme (MAXIPIME) 2 g in sodium chloride 0.9 % 100 mL IVPB (0 g Intravenous Stopped 05/15/18 1721)  clindamycin (CLEOCIN) IVPB 600 mg (600 mg Intravenous New Bag/Given 05/15/18 1740)    Mobility non-ambulatory

## 2018-05-15 NOTE — ED Notes (Signed)
Bed: [facility] Expected date:  Expected time:  Means of arrival:  Comments: EMS FTT

## 2018-05-15 NOTE — ED Provider Notes (Signed)
[place] DEPT Provider Note   CSN: [id number] Arrival date & time: 05/15/18  1411     History   Chief Complaint Chief Complaint  Patient presents with  . Altered Mental Status  . Hyperglycemia    HPI Kristina Summers is a 83 y.o. female.  HPI   Patient is an 84 year old female history of anemia, CAD s/p CABG, CKD, CHF, GERD, CVA, hyperlipidemia, hypertension, A. fib, s/p TAVR, diabetes, presents emergency department today via EMS from [facility] place for evaluation of altered mental status.  Level 5 caveat given patient's altered mental status secondary to her medical condition and previous CVA.  Patient's family members at bedside assist with a history.  They state the patient was recently admitted for a stroke earlier this month.  She was readmitted for recurrence of stroke about a week ago.  She was discharged to [facility] place.  She had been on 2L oxygen this week at [facility] place.  They state that patient was in her normal state of health this morning.  She had been able to answer yes and no questions.  The 8 breakfast with her.  After breakfast she took a nap.  Upon waking up from her nap she began grunting and moaning and appeared to have difficulty breathing.  She also appeared to be in pain.  When asked where she was hurting she pointed to her head. They confirm that patient is DNR.  On EMS arrival, patient had sats in the low 90s and was placed on 4 L nasal cannula.  She was only responsive to pain.  Family states that they have contacted hospice and palliative care of [facility] who is currently on their way to evaluate the patient here in the ED.  They state they do not with the patient to go back to [facility] place upon discharge and they would like to take the family member home to care for her until she passes.   Past Medical History:  Diagnosis Date  . Anemia   . CAD (coronary artery disease)    a. s/p CABG in 2000  . Carotid artery occlusion     . CKD (chronic kidney disease)   . Diastolic CHF ([place]) 60/6301  . Fibromyalgia   . GERD (gastroesophageal reflux disease)   . History of CVA (cerebrovascular accident) 05/24/2014  . History of GI bleed   . Hyperlipidemia   . Hypertension   . Obesity   . PAF (paroxysmal atrial fibrillation) (HCC)    a. on Eliquis  . Peripheral vascular disease ([place])   . 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 Type II diabetes mellitus ([place]) dx'd 1979    Patient Active Problem List   Diagnosis Date Noted  . Acute encephalopathy 05/15/2018  . HCAP (healthcare-associated pneumonia) 05/15/2018  . Cerebral embolism with cerebral infarction 05/09/2018  . Right sided weakness 05/08/2018  . Acute ischemic stroke ([place])   . Labile blood glucose   . Benign essential HTN   . Leukocytosis   . CVA (cerebral vascular accident) ([place]) 04/18/2018  . CHF exacerbation ([place]) 04/10/2018  . Acute exacerbation of congestive heart failure ([place]) 03/20/2018  . Gastrointestinal hemorrhage   . Acute on chronic systolic CHF (congestive heart failure) ([place]) 03/13/2018  . Decreased hearing 02/03/2018  . History of myocardial infarction 02/03/2018  . Methicillin resistant Staphylococcus aureus infection 02/03/2018  . Acute blood loss  anemia 12/22/2017  . MRSA bacteremia 12/12/2017  . CKD (chronic kidney disease) stage 4, GFR 15-29 ml/min (HCC) 12/11/2017  . Foot ulcer, right ([place]) 12/11/2017  . 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 Severe aortic stenosis 10/11/2017  . Hypochromic anemia 08/25/2017  . Essential hypertension 06/10/2017  . Type II diabetes mellitus with renal manifestations ([place]) 06/10/2017  . Acute on chronic congestive heart failure ([place]) 01/28/2017  . Fall   . Fracture of distal end of right fibula 05/29/2014  . History of CVA (cerebrovascular  accident) 05/24/2014  . Peripheral vascular disease, unspecified ([place]) 01/11/2014  . CAD (coronary artery disease)   . Atrial fibrillation, chronic   . Diabetes mellitus type 2 with peripheral artery disease ([place])   . Hyperlipidemia   . History of GI bleed 04/21/2009  . S/P CABG (coronary artery bypass graft) 11/20/1998    Past Surgical History:  Procedure Laterality Date  . ABDOMINAL AORTAGRAM N/A 04/01/2011   Procedure: ABDOMINAL AORTAGRAM;  Surgeon: Conrad [place], MD;  Location: [facility] CATH LAB;  Service: Cardiovascular;  Laterality: N/A;  . ANGIOPLASTY  06/17/11   Left leg common femoral artery cannulation under u/s Left leg runoff  . ARTERY BIOPSY Right 04/13/2018   Procedure: BIOPSY TEMPORAL ARTERY RIGHT;  Surgeon: Rosetta Posner, MD;  Location: [place];  Service: Vascular;  Laterality: Right;  . CARDIAC CATHETERIZATION  10/11/2017  . CARPAL TUNNEL RELEASE Right   . CATARACT EXTRACTION W/ INTRAOCULAR LENS  IMPLANT, BILATERAL  2004-2005  . CORONARY ARTERY BYPASS GRAFT  2000   CABG X5  . ESOPHAGOGASTRODUODENOSCOPY (EGD) WITH PROPOFOL N/A 12/25/2017   Procedure: ESOPHAGOGASTRODUODENOSCOPY (EGD) WITH PROPOFOL;  Surgeon: Wonda Horner, MD;  Location: [facility] ENDOSCOPY;  Service: Endoscopy;  Laterality: N/A;  . EYE SURGERY Left    "lasered before cataract OR"  . GIVENS CAPSULE STUDY  03/14/2018  . GIVENS CAPSULE STUDY N/A 03/14/2018   Procedure: GIVENS CAPSULE STUDY;  Surgeon: Ronald Lobo, MD;  Location: [place];  Service: Endoscopy;  Laterality: N/A;  . LOWER EXTREMITY ANGIOGRAM Bilateral 04/01/2011   Procedure: LOWER EXTREMITY ANGIOGRAM;  Surgeon: Conrad Scammon, MD;  Location[facility] CATH LAB;  Service: Cardiovascular;  Laterality: Bilateral;  . LOWER EXTREMITY ANGIOGRAM Left 06/17/2011   Procedure: LOWER EXTREMITY ANGIOGRAM;  Surgeon: Conrad [place], MD;  Location: [facility] CATH LAB;  Service: Cardiovascular;  Laterality: Left;  . LOWER EXTREMITY ANGIOGRAM N/A 11/18/2011   Procedure: LOWER EXTREMITY  ANGIOGRAM;  Surgeon: Conrad [place], MD;  Location: [facility] CATH LAB;  Service: Cardiovascular;  Laterality: N/A;  . PERCUTANEOUS STENT INTERVENTION Right 04/01/2011   Procedure: PERCUTANEOUS STENT INTERVENTION;  Surgeon: Conrad [place], MD;  Location: [facility] CATH LAB;  Service: Cardiovascular;  Laterality: Right;  rt iliac stent  . PERIPHERAL ARTERIAL STENT GRAFT  04/01/11   right common iliac  . RIGHT/LEFT HEART CATH AND CORONARY/GRAFT ANGIOGRAPHY N/A 10/11/2017   Procedure: RIGHT/LEFT HEART CATH AND CORONARY/GRAFT ANGIOGRAPHY;  Surgeon: Sherren Mocha, MD;  Location: [place] CV LAB;  Service: Cardiovascular;  Laterality: N/A;  . TEE WITHOUT CARDIOVERSION N/A 11/08/2017   Procedure: TRANSESOPHAGEAL ECHOCARDIOGRAM (TEE);  Surgeon: Sherren Mocha, MD;  Location: [place];  Service: Open Heart Surgery;  Laterality: N/A;  . TEE WITHOUT CARDIOVERSION N/A 12/15/2017   Procedure: TRANSESOPHAGEAL ECHOCARDIOGRAM (TEE);  Surgeon: Larey Dresser, MD;  Location: [facility] ENDOSCOPY;  Service: Cardiovascular;  Laterality: N/A;  . TRANSCATHETER AORTIC VALVE REPLACEMENT, TRANSFEMORAL N/A 11/08/2017   Procedure: TRANSCATHETER AORTIC VALVE REPLACEMENT, TRANSFEMORAL;  Surgeon: Sherren Mocha, MD;  Location: [place];  Service: Open Heart Surgery;  Laterality: N/A;  . TRIGGER FINGER RELEASE Left 1996   thumb     OB History   No obstetric history on file.      Home Medications    Prior to Admission medications   Medication Sig Start Date End Date Taking? Authorizing Provider  acetaminophen (TYLENOL) 325 MG tablet Take 2 tablets (650 mg total) by mouth every 6 (six) hours as needed for mild pain, moderate pain or headache. 05/11/18  Yes Hongalgi, Lenis Dickinson, MD  Amino Acids-Protein Hydrolys (FEEDING SUPPLEMENT, PRO-STAT SUGAR FREE 64,) LIQD Take 30 mLs by mouth 2 (two) times daily. 04/21/18  Yes Hosie Poisson, MD  amLODipine (NORVASC) 10 MG tablet Take 10 mg by mouth daily.    Yes [provider]  apixaban (ELIQUIS) 2.5 MG  TABS tablet Take 1 tablet (2.5 mg total) by mouth 2 (two) times daily. 05/11/18  Yes Hongalgi, Lenis Dickinson, MD  atorvastatin (LIPITOR) 80 MG tablet Take 1 tablet (80 mg total) by mouth daily at 6 PM. 05/11/18  Yes Hongalgi, Lenis Dickinson, MD  collagenase (SANTYL) ointment Apply 1 application topically daily. Apply to right foot, lateral 5th digit full thickness wound in a 1/8 inch layer.  Top with a saline moistened gauze 2x2, cover with dry dressing and secure with medipore tape. Place foot into Prevalon boot to correct lateral rotation. 05/11/18  Yes Hongalgi, Lenis Dickinson, MD  ferrous sulfate 325 (65 FE) MG tablet Take 325 mg by mouth daily with breakfast.   Yes [provider]  furosemide (LASIX) 40 MG tablet Take 1 tablet (40 mg total) by mouth daily. 05/11/18  Yes Hongalgi, Lenis Dickinson, MD  insulin aspart (NOVOLOG) 100 UNIT/ML injection Inject 0-15 Units into the skin 3 (three) times daily with meals. CBG < 70: implement hypoglycemia protocol CBG 70 - 120: 0 units CBG 121 - 150: 2 units CBG 151 - 200: 3 units CBG 201 - 250: 5 units CBG 251 - 300: 8 units CBG 301 - 350: 11 units CBG 351 - 400: 15 units CBG > 400: call MD. Patient taking differently: Inject 0-15 Units into the skin 3 (three) times daily with meals. CBG < 70: implement hypoglycemia protocol CBG 70 - 200: 0 units      201-250: 2 units      251-300: 4 units      301-350: 6 units      351-400: 8 units      401-450: 10 units      451-500: 12 units      501-800: 14 units    Over 800: Call NP/PA 05/11/18  Yes Hongalgi, Lenis Dickinson, MD  insulin NPH Human (HUMULIN N,NOVOLIN N) 100 UNIT/ML injection Inject 0.1 mLs (10 Units total) into the skin at bedtime. 05/11/18  Yes Hongalgi, Lenis Dickinson, MD  insulin NPH Human (HUMULIN N,NOVOLIN N) 100 UNIT/ML injection Inject 0.2 mLs (20 Units total) into the skin daily before breakfast. 05/11/18  Yes Hongalgi, Lenis Dickinson, MD  linagliptin (TRADJENTA) 5 MG TABS tablet Take 5 mg by mouth daily.   Yes [provider]  metoprolol tartrate (LOPRESSOR) 25 MG tablet Take 1 tablet (25 mg total) by mouth 2 (two) times daily. 03/18/18 05/15/18 Yes Arrien, Jimmy Picket, MD  Multiple Vitamin (MULTIVITAMIN WITH MINERALS) TABS tablet Take 1 tablet by mouth daily. One a Day 50 plus   Yes [provider]  nitroGLYCERIN (NITROSTAT) 0.4 MG SL tablet  Place 0.4 mg under the tongue every 5 (five) minutes as needed for chest pain.   Yes [provider]  pantoprazole (PROTONIX) 40 MG tablet Take 1 tablet (40 mg total) by mouth daily for 30 doses. 03/18/18 05/15/18 Yes Arrien, Jimmy Picket, MD  predniSONE (DELTASONE) 20 MG tablet Take 2 tablets (40 mg total) by mouth daily with breakfast. Taper down by 10 mg every week. 05/12/18  Yes Hongalgi, Lenis Dickinson, MD  senna (SENOKOT) 8.6 MG TABS tablet Take 1 tablet by mouth as needed for mild constipation.   Yes [provider]    Family History Family History  Problem Relation Age of Onset  . Other Brother        intestinal blockage  . Diabetes Brother     Social History Social History   Tobacco Use  . Smoking status: Former Smoker    Packs/day: 2.00    Years: 30.00    Pack years: 60.00    Types: Cigarettes    Last attempt to quit: 04/19/1990    Years since quitting: 28.0  . Smokeless tobacco: Never Used  . Tobacco comment: stopped smoking cigarettes 1991  Substance Use Topics  . Alcohol use: Not Currently    Alcohol/week: 0.0 standard drinks    Comment: "tried different alcohols when I was 1st married; never drank much at  ALL"  . Drug use: Never     Allergies   Morphine and related; Lisinopril; and Zoloft [sertraline hcl]   Review of Systems Review of Systems  Unable to perform ROS: Acuity of condition  Respiratory: Positive for shortness of breath.      Physical Exam Updated Vital Signs BP (!) 164/55   Pulse 73   Temp 97.7 F (36.5 C) (Oral)   Resp 13   SpO2 95%   Physical Exam Vitals signs and nursing  note reviewed.  Constitutional:      Comments: Eyes closed  HENT:     Head: Normocephalic and atraumatic.     Nose: Nose normal.     Mouth/Throat:     Mouth: Mucous membranes are dry.  Eyes:     Conjunctiva/sclera: Conjunctivae normal.     Pupils: Pupils are equal, round, and reactive to light.  Neck:     Musculoskeletal: Neck supple.  Cardiovascular:     Rate and Rhythm: Normal rate.     Heart sounds: Normal heart sounds. No murmur.  Pulmonary:     Comments: Tachypneic, anterior lungs sounds are coarse. Abdominal:     Palpations: Abdomen is soft.     Tenderness: There is no abdominal tenderness.  Musculoskeletal:     Comments: RUE is edematous. There are multiple bruises in various stages of healing. No significant warmth to the RUE.   Neurological:     Comments: Opens eyes to voice. No blink to threat. Moving BLE. Will not move BUE or follow commands. Withdrawals BLE to pain. Intermittently shakes head no to questions.      ED Treatments / Results  Labs (all labs ordered are listed, but only abnormal results are displayed) Labs Reviewed  COMPREHENSIVE METABOLIC PANEL - Abnormal; Notable for the following components:      Result Value   Sodium 134 (*)    CO2 19 (*)    Glucose, Bld 217 (*)    BUN 98 (*)    Creatinine, Ser 2.04 (*)    Calcium 7.7 (*)    Total Protein 6.0 (*)    Albumin 2.3 (*)  AST 43 (*)    Alkaline Phosphatase 142 (*)    Total Bilirubin 2.9 (*)    GFR calc non Af Amer 22 (*)    GFR calc Af Amer 26 (*)    All other components within normal limits  CBC WITH DIFFERENTIAL/PLATELET - Abnormal; Notable for the following components:   WBC 12.4 (*)    RBC 2.99 (*)    Hemoglobin 8.3 (*)    HCT 27.8 (*)    MCHC 29.9 (*)    RDW 20.8 (*)    Platelets 120 (*)    Neutro Abs 12.0 (*)    Lymphs Abs 0.2 (*)    Abs Immature Granulocytes 0.09 (*)    All other components within normal limits  CULTURE, BLOOD (ROUTINE X 2)  CULTURE, BLOOD (ROUTINE X 2)   URINE CULTURE  EXPECTORATED SPUTUM ASSESSMENT W REFEX TO RESP CULTURE  GRAM STAIN  LACTIC ACID, PLASMA  LACTIC ACID, PLASMA  URINALYSIS, ROUTINE W REFLEX MICROSCOPIC  PROCALCITONIN  PROCALCITONIN  STREP PNEUMONIAE URINARY ANTIGEN  LEGIONELLA PNEUMOPHILA SEROGP 1 UR AG  MAGNESIUM  BASIC METABOLIC PANEL  CBC  PROTIME-INR  HIV ANTIBODY (ROUTINE TESTING W REFLEX)    EKG None  Radiology Dg Chest 1 View  Result Date: 05/15/2018 CLINICAL DATA:  Shortness of Breath EXAM: CHEST  1 VIEW COMPARISON:  04/10/2018 FINDINGS: Cardiac shadow is mildly enlarged. Changes of coronary bypass grafting and prior TAVR are noted. Increased central vascular congestion is noted with patchy infiltrate most prominent within the midportion of the right lung. Small right-sided pleural effusion is noted. No acute bony abnormality is seen. Chronic changes in the proximal right humerus are noted. IMPRESSION: Vascular congestion increased from the prior exam. Patchy infiltrative opacities are noted bilaterally worst in the midportion of the right lung. Electronically Signed   By: Inez Catalina M.D.   On: 05/15/2018 15:51   Ct Head Wo Contrast  Result Date: 05/15/2018 CLINICAL DATA:  Acute altered level of consciousness. Stroke 1 week ago. The EXAM: CT HEAD WITHOUT CONTRAST TECHNIQUE: Contiguous axial images were obtained from the base of the skull through the vertex without intravenous contrast. COMPARISON:  CT head without contrast 05/08/2018. MRI brain without contrast 05/08/2018 FINDINGS: Brain: A progressive medial left frontal lobe infarct is present. This is just anterior and medial to the areas of restricted diffusion on the most recent MRI scan. More remote encephalomalacia is present inferiorly in the left frontal lobe. The subacute posterior left frontal lobe infarct is also noted with some suggestion of cortical laminar necrosis. White matter changes are present on the right without cortical infarct. Basal  ganglia are intact. The brainstem and cerebellum are within normal limits. Vascular: Dense atherosclerotic calcifications are present within the cavernous internal carotid arteries bilaterally and at the dural margin of both vertebral arteries. No hyperdense vessel is present. Skull: Calvarium is intact. No focal lytic or blastic lesions are present. Sinuses/Orbits: The paranasal sinuses and mastoid air cells are clear. Bilateral lens replacements are present. Globes and orbits are within normal limits. IMPRESSION: 1. Progressive medial anterior left frontal lobe infarct, likely within the ACA territory. 2. Subacute posterior left frontal lobe infarct. 3. Stable remote left frontal lobe encephalomalacia. 4. Atherosclerosis. Electronically Signed   By: San Morelle M.D.   On: 05/15/2018 16:05    Procedures Procedures (including critical care time) CRITICAL CARE Performed by: Rodney Booze   Total critical care time: 36 minutes  Critical care time was exclusive of separately billable  procedures and treating other patients.  Critical care was necessary to treat or prevent imminent or life-threatening deterioration.  Critical care was time spent personally by me on the following activities: development of treatment plan with patient and/or surrogate as well as nursing, discussions with consultants, evaluation of patient's response to treatment, examination of patient, obtaining history from patient or surrogate, ordering and performing treatments and interventions, ordering and review of laboratory studies, ordering and review of radiographic studies, pulse oximetry and re-evaluation of patient's condition.   Medications Ordered in ED Medications  clindamycin (CLEOCIN) IVPB 600 mg (600 mg Intravenous New Bag/Given 05/15/18 1740)  vancomycin (VANCOCIN) 1,500 mg in sodium chloride 0.9 % 500 mL IVPB (has no administration in time range)  apixaban (ELIQUIS) tablet 2.5 mg (has no  administration in time range)  collagenase (SANTYL) ointment 1 application (has no administration in time range)  sodium chloride flush (NS) 0.9 % injection 3 mL (has no administration in time range)  sodium chloride flush (NS) 0.9 % injection 3 mL (has no administration in time range)  sodium chloride flush (NS) 0.9 % injection 3 mL (has no administration in time range)  0.9 %  sodium chloride infusion (has no administration in time range)  acetaminophen (TYLENOL) tablet 650 mg (has no administration in time range)    Or  acetaminophen (TYLENOL) suppository 650 mg (has no administration in time range)  polyethylene glycol (MIRALAX / GLYCOLAX) packet 17 g (has no administration in time range)  bisacodyl (DULCOLAX) suppository 10 mg (has no administration in time range)  sodium phosphate (FLEET) 7-19 GM/118ML enema 1 enema (has no administration in time range)  ondansetron (ZOFRAN) tablet 4 mg (has no administration in time range)    Or  ondansetron (ZOFRAN) injection 4 mg (has no administration in time range)  levalbuterol (XOPENEX) nebulizer solution 0.63 mg (has no administration in time range)  levalbuterol (XOPENEX) nebulizer solution 0.63 mg (has no administration in time range)  furosemide (LASIX) injection 20 mg (has no administration in time range)  pantoprazole (PROTONIX) injection 40 mg (has no administration in time range)  metoprolol tartrate (LOPRESSOR) injection 5 mg (has no administration in time range)  HYDROmorphone (DILAUDID) injection 0.5 mg (has no administration in time range)  sodium chloride flush (NS) 0.9 % injection 3 mL (3 mLs Intravenous Given 05/15/18 1505)  sodium chloride 0.9 % bolus 500 mL (0 mLs Intravenous Stopped 05/15/18 1640)  fentaNYL (SUBLIMAZE) injection 50 mcg (50 mcg Intravenous Given 05/15/18 1616)  ceFEPIme (MAXIPIME) 2 g in sodium chloride 0.9 % 100 mL IVPB (0 g Intravenous Stopped 05/15/18 1721)     Initial Impression / Assessment and Plan / ED  Course  I have reviewed the triage vital signs and the nursing notes.  Pertinent labs & imaging results that were available during my care of the patient were reviewed by me and considered in my medical decision making (see chart for details).     Final Clinical Impressions(s) / ED Diagnoses   Final diagnoses:  Aspiration pneumonia, unspecified aspiration pneumonia type, unspecified laterality, unspecified part of lung ([place])  Cerebrovascular accident (CVA), unspecified mechanism ([place])   Patient presenting with altered mental status that worsened today.  Also with new oxygen requirement.  Patient grunting on exam and appears to be in pain with decreased mentation.  Unable to answer questions or follow most commands.  Afebrile.  Requiring 4 L O2 nasal cannula.  Slightly hypertensive.  Discussed with patient family at bedside about her history.  They  have contacted hospice and palliative care [facility].  Patient is a DNR.  Work-up reveals a leukocytosis of 12.4.  Hemoglobin is stable.  Platelets are low, trending down.  Had an elevated 2.04, BUN is also elevated.  Alk phos is elevated and AST is slightly elevated.  Bilirubin is elevated as well at 2.9.  Lactic acid is negative.  UA pending.  Blood cultures obtained.  Chest x-ray with vascular congestion increased from prior exam with patchy infiltrative opacities bilaterally worse in the midportion of the right lung, suspect based on patient's history that this could be secondary to aspiration pneumonia and IV antibiotics were ordered to treat this.  Her head CT does show progressive medial anterior left frontal lobe infarct likely within the ACA territory.  Patient has already had a stroke evaluation this month and her medical therapy has been optimized.  Family has stated that they are comfortable with treating reversible pathologies at this time therefore will defer further intervention of this finding at this time.  3:41PM [facility] is at the bedside evaluating patient.  On reassessment patients family states that palliative care recommended treatment for current illness and are in the process of obtaining appropriate care items that will be required when patient is discharged back to her daughter's home.  Discussed case with Dr. Grandville Silos with hospital service who accepts patient for admission.  Pt was seen in conjunction with Dr. Venora Maples who personally evaluated the patient is in agreement with this plan.  ED Discharge Orders    None       Bishop Dublin 05/15/18 1804    Jola Schmidt, MD 05/18/18 [phone]

## 2018-05-15 NOTE — Progress Notes (Signed)
[facility]) Hospital Liaison Family contacted [facility] referral center requesting hospice services at home after discharge (instead of returning to [facility]). Chart and patient information reviewed by [facility] physician and eligibility has been confirmed.   Spoke with daughters Nunzio Cory and [name] at bedside to initiate education related to hospice philosophy, services and team approach to care. They verbalized understanding of information given.  Per discussion, plan is for discharge to home by [facility] after DME has been delivered to home.   Please send signed and completed DNR form home with patient/family.   Patient will need prescriptions for discharge comfort medications.  DME needs have been discussed, patient currently has a cane, walker and BSC. Hospital bed and oxygen setup ordered by [facility] DME specialist. [place] to arrange delivery to the home and coordinate with daughter Nunzio Cory. Home address has been verified and is correct in the chart.  [facility] Referral Center aware of the above.  Completed discharge summary will need to be faxed to [facility] at [phone] when final.  Please notify [facility] when patient is ready to leave the unit at discharge. (Call [phone] between 8:30 and 5pm. Call [phone] after 5pm.)  [facility] information and contact numbers given to daughters during this visit.  Above information shared with Archivist. Left message for [facility].  Please call with hospice related questions. Thank you for this referral. Erling Conte, [facility] Liaison [place] are listed on AMION under Hospice and [place].

## 2018-05-15 NOTE — Progress Notes (Signed)
Pharmacy Antibiotic Note  Kristina Summers is a 83 y.o. female admitted on 05/15/2018 with history of anemia, CAD s/p CABG, CKD, CHF, GERD, CVA, hyperlipidemia, hypertension, A. fib, s/p TAVR, diabetes, presents ED today via EMS from [facility] for evaluation of altered mental status.  Pharmacy has been consulted for vancomycin and zosyn dosing for pna.  Plan: Vancomycin 1500mg  IV x 1 then 1250 q48h (AUC 488.5, Scr 2.0) Zosyn 3.375g IV Q8H infused over 4hrs. Daily Scr Follow renal function, cultures and clinical course     Temp (24hrs), Avg:97.7 F (36.5 C), Min:97.7 F (36.5 C), Max:97.7 F (36.5 C)  Recent Labs  Lab 05/09/18 0648 05/10/18 0757 05/10/18 1926 05/11/18 0511 05/15/18 1450 05/15/18 1455  WBC 11.2* 13.3* 12.0* 14.5* 12.4*  --   CREATININE 1.69*  --   --  1.85* 2.04*  --   LATICACIDVEN  --   --   --   --   --  1.2    Estimated Creatinine Clearance: 23.6 mL/min (A) (by C-G formula based on SCr of 2.04 mg/dL (H)).    Allergies  Allergen Reactions  . Morphine And Related Nausea And Vomiting and Other (See Comments)    Chest pain, also   . Lisinopril Cough  . Zoloft [Sertraline Hcl] Nausea Only         Antimicrobials this admission: 1/27 cefepime x 1 1/27 clinda x 1 1/27 vanc >> 1/28 zosyn >> Dose adjustments this admission:   Microbiology results: 1/27 BCx:  1/27 UCx:  1/27 Sputum:    Thank you for allowing pharmacy to be a part of this patient's care.  Dolly Rias RPh 05/15/2018, 6:04 PM Pager [phone]

## 2018-05-15 NOTE — ED Triage Notes (Signed)
Pt has a stroke about a week ago. Pt has been getting rehab for stroke. Pt has decreased mentation Last seen normal was 04/29/2018 Swelling in right hand Patient only responsive to pain. 4 L [place] on EMS

## 2018-05-15 NOTE — Progress Notes (Signed)
A consult was received from an ED physician for vancomycin  per pharmacy dosing.  The patient's profile has been reviewed for ht/wt/allergies/indication/available labs.   A one time order has been placed for vancomycin 1500mg .    Further antibiotics/pharmacy consults should be ordered by admitting physician if indicated.                       Thank you, Dolly Rias RPh 05/15/2018, 4:25 PM Pager [phone]

## 2018-05-15 NOTE — H&P (Signed)
History and Physical    Kristina Summers CVE:[id number] DOB: 02-Oct-1935 DOA: 05/15/2018  PCP: Patient, No Pcp Per  Patient coming from: Skilled nursing facility  I have personally briefly reviewed patient's old medical records in [place]  Chief Complaint: Confusion/altered mental status  HPI: Kristina Summers is a 83 y.o. female with medical history significant of diabetes mellitus, A. fib on Eliquis, status post TAVR 11/08/2017, hypertension, hyperlipidemia, chronic diastolic heart failure, coronary artery disease status post CABG, chronic kidney disease, hearing loss, legal blindness, recently hospitalized 05/08/2018 to 05/11/2018 for acute CVA with resultant pseudobulbar speech and right hemi-paresis discharged on apixaban dose adjusted for age and renal function who supposed to follow-up with neurology in the outpatient setting returning to the ED with altered mental status/worsening confusion.  Patient aphasic and nods her head yes or no and as such history obtained per ED physician.  No family at bedside at the time of my interview.  Per ED physician patient noted to have been recently hospitalized for acute CVA earlier this month and readmitted for recurrence of stroke about 1 week prior to admission.  Patient was discharged to [facility] and had been on 2 L O2 at the skilled nursing facility.  He was stated that patient was at his baseline the morning of admission was able to answer yes and no questions.  Patient noted to have eating her breakfast after which she took a nap and upon awakening noted to be grunting, moaning and appeared to have some difficulty with shortness of breath.  There was also concern for pain.  When patient asked whether she was in pain she pointed towards the head.  Was also noted per ED physician that on EMS arrival patient with sats in the low 90s and placed on 4 L nasal cannula and patient at that time was only responsive to pain.  It was noted that patient's family had  contacted hospice and palliative care of [facility] who are on the way to evaluate patient in the ED.  Per ED physician they did not want to go back to [facility] and on discharge would like to take patient home with them.  Per ED physician family only wanted to treat the reversible things and there was concern for aspiration pneumonia and family willing to accept IV antibiotics.  Family the ED physician were not interested in any new further stroke work-up.  There was no noted fevers, no noted chills, no noted chest pain, no noted cough, no noted abdominal pain, no noted dysuria, no noted melena, no noted hematemesis, no noted hematochezia, no noted syncopal episode.  ED Course: Patient seen in the ED chest x-ray concerning for possible pneumonia.  Chest x-ray also concerning for possible volume overload.  Head CT which was done also concerning for progressive medial anterior left frontal lobe infarct likely within ACA territory.  Subacute posterior left frontal lobe infarct.  Stable remote left frontal lobe encephalomalacia.  Atherosclerosis.  Comprehensive metabolic profile done had a bicarb of 19, glucose of 217, BUN of 98, creatinine of 2.04, AST of 43, ALT of 38, protein of 6, albumin of 2.9 otherwise was within normal limits.  Lactic acid level 1.2.  CBC done had a white count of 12.4, hemoglobin of 8.3 stable from prior hemoglobin, platelet count of 120 otherwise was within normal limits.  Urinalysis not done.  Review of Systems: As per HPI otherwise 10 point review of systems negative.  Past Medical History:  Diagnosis Date  .  Anemia   . CAD (coronary artery disease)    a. s/p CABG in 2000  . Carotid artery occlusion   . CKD (chronic kidney disease)   . Diastolic CHF ([place]) 55/7322  . Fibromyalgia   . GERD (gastroesophageal reflux disease)   . History of CVA (cerebrovascular accident) 05/24/2014  . History of GI bleed   . Hyperlipidemia   . Hypertension   . Obesity   . PAF (paroxysmal  atrial fibrillation) (HCC)    a. on Eliquis  . Peripheral vascular disease ([place])   . 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 Type II diabetes mellitus ([place]) dx'd 1979    Past Surgical History:  Procedure Laterality Date  . ABDOMINAL AORTAGRAM N/A 04/01/2011   Procedure: ABDOMINAL AORTAGRAM;  Surgeon: Conrad Big Stone, MD;  Location: [facility] CATH LAB;  Service: Cardiovascular;  Laterality: N/A;  . ANGIOPLASTY  06/17/11   Left leg common femoral artery cannulation under u/s Left leg runoff  . ARTERY BIOPSY Right 04/13/2018   Procedure: BIOPSY TEMPORAL ARTERY RIGHT;  Surgeon: Rosetta Posner, MD;  Location: [place];  Service: Vascular;  Laterality: Right;  . CARDIAC CATHETERIZATION  10/11/2017  . CARPAL TUNNEL RELEASE Right   . CATARACT EXTRACTION W/ INTRAOCULAR LENS  IMPLANT, BILATERAL  2004-2005  . CORONARY ARTERY BYPASS GRAFT  2000   CABG X5  . ESOPHAGOGASTRODUODENOSCOPY (EGD) WITH PROPOFOL N/A 12/25/2017   Procedure: ESOPHAGOGASTRODUODENOSCOPY (EGD) WITH PROPOFOL;  Surgeon: Wonda Horner, MD;  Location: [facility] ENDOSCOPY;  Service: Endoscopy;  Laterality: N/A;  . EYE SURGERY Left    "lasered before cataract OR"  . GIVENS CAPSULE STUDY  03/14/2018  . GIVENS CAPSULE STUDY N/A 03/14/2018   Procedure: GIVENS CAPSULE STUDY;  Surgeon: Ronald Lobo, MD;  Location: [place];  Service: Endoscopy;  Laterality: N/A;  . LOWER EXTREMITY ANGIOGRAM Bilateral 04/01/2011   Procedure: LOWER EXTREMITY ANGIOGRAM;  Surgeon: Conrad [place], MD;  Location: [facility] CATH LAB;  Service: Cardiovascular;  Laterality: Bilateral;  . LOWER EXTREMITY ANGIOGRAM Left 06/17/2011   Procedure: LOWER EXTREMITY ANGIOGRAM;  Surgeon: Conrad Moraine, MD;  Location: [facility] CATH LAB;  Service: Cardiovascular;  Laterality: Left;  . LOWER EXTREMITY ANGIOGRAM N/A 11/18/2011   Procedure: LOWER EXTREMITY ANGIOGRAM;  Surgeon: Conrad Hanover, MD;  Location: [facility] CATH LAB;  Service: Cardiovascular;  Laterality: N/A;  . PERCUTANEOUS STENT INTERVENTION Right 04/01/2011   Procedure: PERCUTANEOUS STENT INTERVENTION;  Surgeon: Conrad Ringwood, MD;  Location: [facility] CATH LAB;  Service: Cardiovascular;  Laterality: Right;  rt iliac stent  . PERIPHERAL ARTERIAL STENT GRAFT  04/01/11   right common iliac  . RIGHT/LEFT HEART CATH AND CORONARY/GRAFT ANGIOGRAPHY N/A 10/11/2017   Procedure: RIGHT/LEFT HEART CATH AND CORONARY/GRAFT ANGIOGRAPHY;  Surgeon: Sherren Mocha, MD;  Location: [place] CV LAB;  Service: Cardiovascular;  Laterality: N/A;  . TEE WITHOUT CARDIOVERSION N/A 11/08/2017   Procedure: TRANSESOPHAGEAL ECHOCARDIOGRAM (TEE);  Surgeon: Sherren Mocha, MD;  Location: [place];  Service: Open Heart Surgery;  Laterality: N/A;  . TEE WITHOUT CARDIOVERSION N/A 12/15/2017   Procedure: TRANSESOPHAGEAL ECHOCARDIOGRAM (TEE);  Surgeon: Larey Dresser, MD;  Location: [facility] ENDOSCOPY;  Service: Cardiovascular;  Laterality: N/A;  . TRANSCATHETER AORTIC VALVE REPLACEMENT, TRANSFEMORAL N/A 11/08/2017   Procedure: TRANSCATHETER AORTIC VALVE REPLACEMENT, TRANSFEMORAL;  Surgeon: Sherren Mocha, MD;  Location: [place];  Service: Open Heart Surgery;  Laterality: N/A;  . TRIGGER FINGER  RELEASE Left 1996   thumb     reports that she quit smoking about 28 years ago. Her smoking use included cigarettes. She has a 60.00 pack-year smoking history. She has never used smokeless tobacco. She reports previous alcohol use. She reports that she does not use drugs.  Allergies  Allergen Reactions  . Morphine And Related Nausea And Vomiting and Other (See Comments)    Chest pain, also   . Lisinopril Cough  . Zoloft [Sertraline Hcl] Nausea Only         Family History  Problem Relation Age of Onset  . Other Brother        intestinal blockage  . Diabetes Brother    Family history reviewed and not pertinent.  Prior to Admission medications   Medication Sig Start  Date End Date Taking? Authorizing Provider  acetaminophen (TYLENOL) 325 MG tablet Take 2 tablets (650 mg total) by mouth every 6 (six) hours as needed for mild pain, moderate pain or headache. 05/11/18  Yes Hongalgi, Lenis Dickinson, MD  Amino Acids-Protein Hydrolys (FEEDING SUPPLEMENT, PRO-STAT SUGAR FREE 64,) LIQD Take 30 mLs by mouth 2 (two) times daily. 04/21/18  Yes Hosie Poisson, MD  amLODipine (NORVASC) 10 MG tablet Take 10 mg by mouth daily.    Yes [provider]  apixaban (ELIQUIS) 2.5 MG TABS tablet Take 1 tablet (2.5 mg total) by mouth 2 (two) times daily. 05/11/18  Yes Hongalgi, Lenis Dickinson, MD  atorvastatin (LIPITOR) 80 MG tablet Take 1 tablet (80 mg total) by mouth daily at 6 PM. 05/11/18  Yes Hongalgi, Lenis Dickinson, MD  collagenase (SANTYL) ointment Apply 1 application topically daily. Apply to right foot, lateral 5th digit full thickness wound in a 1/8 inch layer.  Top with a saline moistened gauze 2x2, cover with dry dressing and secure with medipore tape. Place foot into Prevalon boot to correct lateral rotation. 05/11/18  Yes Hongalgi, Lenis Dickinson, MD  ferrous sulfate 325 (65 FE) MG tablet Take 325 mg by mouth daily with breakfast.   Yes [provider]  furosemide (LASIX) 40 MG tablet Take 1 tablet (40 mg total) by mouth daily. 05/11/18  Yes Hongalgi, Lenis Dickinson, MD  insulin aspart (NOVOLOG) 100 UNIT/ML injection Inject 0-15 Units into the skin 3 (three) times daily with meals. CBG < 70: implement hypoglycemia protocol CBG 70 - 120: 0 units CBG 121 - 150: 2 units CBG 151 - 200: 3 units CBG 201 - 250: 5 units CBG 251 - 300: 8 units CBG 301 - 350: 11 units CBG 351 - 400: 15 units CBG > 400: call MD. Patient taking differently: Inject 0-15 Units into the skin 3 (three) times daily with meals. CBG < 70: implement hypoglycemia protocol CBG 70 - 200: 0 units      201-250: 2 units      251-300: 4 units      301-350: 6 units      351-400: 8 units      401-450: 10 units      451-500: 12  units      501-800: 14 units    Over 800: Call NP/PA 05/11/18  Yes Hongalgi, Lenis Dickinson, MD  insulin NPH Human (HUMULIN N,NOVOLIN N) 100 UNIT/ML injection Inject 0.1 mLs (10 Units total) into the skin at bedtime. 05/11/18  Yes Hongalgi, Lenis Dickinson, MD  insulin NPH Human (HUMULIN N,NOVOLIN N) 100 UNIT/ML injection Inject 0.2 mLs (20 Units total) into the skin daily before breakfast. 05/11/18  Yes Hongalgi, Everlene Farrier  D, MD  linagliptin (TRADJENTA) 5 MG TABS tablet Take 5 mg by mouth daily.   Yes [provider]  metoprolol tartrate (LOPRESSOR) 25 MG tablet Take 1 tablet (25 mg total) by mouth 2 (two) times daily. 03/18/18 05/15/18 Yes Arrien, Jimmy Picket, MD  Multiple Vitamin (MULTIVITAMIN WITH MINERALS) TABS tablet Take 1 tablet by mouth daily. One a Day 50 plus   Yes [provider]  nitroGLYCERIN (NITROSTAT) 0.4 MG SL tablet Place 0.4 mg under the tongue every 5 (five) minutes as needed for chest pain.   Yes [provider]  pantoprazole (PROTONIX) 40 MG tablet Take 1 tablet (40 mg total) by mouth daily for 30 doses. 03/18/18 05/15/18 Yes Arrien, Jimmy Picket, MD  predniSONE (DELTASONE) 20 MG tablet Take 2 tablets (40 mg total) by mouth daily with breakfast. Taper down by 10 mg every week. 05/12/18  Yes Hongalgi, Lenis Dickinson, MD  senna (SENOKOT) 8.6 MG TABS tablet Take 1 tablet by mouth as needed for mild constipation.   Yes [provider]    Physical Exam: Vitals:   05/15/18 1730 05/15/18 1800 05/15/18 1859 05/15/18 1900  BP: (!) 164/55 (!) 168/66 (!) 161/75   Pulse: 73 73 77   Resp: 13 14 (!) 21   Temp:   97.8 F (36.6 C)   TempSrc:   Oral   SpO2: 95% 95% 96%   Weight:    87.8 kg  Height:    5\' 6"  (1.676 m)    Constitutional: NAD, calm, comfortable Vitals:   05/15/18 1730 05/15/18 1800 05/15/18 1859 05/15/18 1900  BP: (!) 164/55 (!) 168/66 (!) 161/75   Pulse: 73 73 77   Resp: 13 14 (!) 21   Temp:   97.8 F (36.6 C)   TempSrc:   Oral   SpO2: 95% 95% 96%    Weight:    87.8 kg  Height:    5\' 6"  (1.676 m)   Eyes: PERRL, lids and conjunctivae normal ENMT: Mucous membranes are dry. Posterior pharynx clear of any exudate or lesions.Normal dentition.  Neck: normal, supple, no masses, no thyromegaly Respiratory: Some diffuse coarse breath sounds.  Some crackles noted.  Some scattered rhonchi.  No wheezing noted.  Normal respiratory effort.  No use of accessory muscles of respiration.  Cardiovascular: Irregularly irregular.  Right upper extremity swelling which per ED physician unchanged.  No JVD noted.  No lower extremity edema noted. Abdomen: no tenderness, no masses palpated. No hepatosplenomegaly. Bowel sounds positive.  Musculoskeletal: no clubbing / cyanosis. No joint deformity upper and lower extremities. Good ROM, no contractures. Normal muscle tone.  Skin: Abrasion noted on right shin.  Left upper extremity with some slight erythema concerning for IV infiltrated. Neurologic: A phasic.  Right hemiparesis.  Pupils equal round and reactive to light and accommodation.  1/5 right lower extremity strength.  4/5 left lower extremity strength.  Unable to assess reflexes symmetrically and diffusely Psychiatric: Unable to assess judgment and insight. Alert. Normal mood.   Labs on Admission: I have personally reviewed following labs and imaging studies  CBC: Recent Labs  Lab 05/09/18 0648 05/10/18 0757 05/10/18 1926 05/11/18 0511 05/15/18 1450  WBC 11.2* 13.3* 12.0* 14.5* 12.4*  NEUTROABS  --   --   --   --  12.0*  HGB 7.1* 6.9* 8.2* 8.3* 8.3*  HCT 24.1* 23.4* 27.3* 27.9* 27.8*  MCV 88.9 88.6 88.9 89.7 93.0  PLT 145* 144* 152 141* 505*   Basic Metabolic Panel: Recent Labs  Lab 05/09/18 0648 05/11/18 0511 05/15/18 1450  NA 141 139 134*  K 3.9 3.9 4.6  CL 110 109 105  CO2 24 22 19*  GLUCOSE 147* 315* 217*  BUN 61* 65* 98*  CREATININE 1.69* 1.85* 2.04*  CALCIUM 8.3* 8.1* 7.7*   GFR: Estimated Creatinine Clearance: 24.5 mL/min (A) (by  C-G formula based on SCr of 2.04 mg/dL (H)). Liver Function Tests: Recent Labs  Lab 05/15/18 1450  AST 43*  ALT 38  ALKPHOS 142*  BILITOT 2.9*  PROT 6.0*  ALBUMIN 2.3*   No results for input(s): LIPASE, AMYLASE in the last 168 hours. No results for input(s): AMMONIA in the last 168 hours. Coagulation Profile: No results for input(s): INR, PROTIME in the last 168 hours. Cardiac Enzymes: No results for input(s): CKTOTAL, CKMB, CKMBINDEX, TROPONINI in the last 168 hours. BNP (last 3 results) No results for input(s): PROBNP in the last 8760 hours. HbA1C: No results for input(s): HGBA1C in the last 72 hours. CBG: Recent Labs  Lab 05/10/18 1206 05/10/18 1648 05/10/18 2158 05/11/18 0651 05/11/18 1129  GLUCAP 238* 357* 341* 283* 229*   Lipid Profile: No results for input(s): CHOL, HDL, LDLCALC, TRIG, CHOLHDL, LDLDIRECT in the last 72 hours. Thyroid Function Tests: No results for input(s): TSH, T4TOTAL, FREET4, T3FREE, THYROIDAB in the last 72 hours. Anemia Panel: No results for input(s): VITAMINB12, FOLATE, FERRITIN, TIBC, IRON, RETICCTPCT in the last 72 hours. Urine analysis:    Component Value Date/Time   COLORURINE YELLOW 03/20/2018 1721   APPEARANCEUR HAZY (A) 03/20/2018 1721   LABSPEC 1.013 03/20/2018 1721   PHURINE 6.0 03/20/2018 1721   GLUCOSEU >=500 (A) 03/20/2018 1721   HGBUR LARGE (A) 03/20/2018 1721   BILIRUBINUR NEGATIVE 03/20/2018 1721   KETONESUR NEGATIVE 03/20/2018 1721   PROTEINUR 100 (A) 03/20/2018 1721   UROBILINOGEN 1.0 05/29/2014 1052   NITRITE NEGATIVE 03/20/2018 1721   LEUKOCYTESUR MODERATE (A) 03/20/2018 1721    Radiological Exams on Admission: Dg Chest 1 View  Result Date: 05/15/2018 CLINICAL DATA:  Shortness of Breath EXAM: CHEST  1 VIEW COMPARISON:  04/10/2018 FINDINGS: Cardiac shadow is mildly enlarged. Changes of coronary bypass grafting and prior TAVR are noted. Increased central vascular congestion is noted with patchy infiltrate most  prominent within the midportion of the right lung. Small right-sided pleural effusion is noted. No acute bony abnormality is seen. Chronic changes in the proximal right humerus are noted. IMPRESSION: Vascular congestion increased from the prior exam. Patchy infiltrative opacities are noted bilaterally worst in the midportion of the right lung. Electronically Signed   By: Inez Catalina M.D.   On: 05/15/2018 15:51   Ct Head Wo Contrast  Result Date: 05/15/2018 CLINICAL DATA:  Acute altered level of consciousness. Stroke 1 week ago. The EXAM: CT HEAD WITHOUT CONTRAST TECHNIQUE: Contiguous axial images were obtained from the base of the skull through the vertex without intravenous contrast. COMPARISON:  CT head without contrast 05/08/2018. MRI brain without contrast 05/08/2018 FINDINGS: Brain: A progressive medial left frontal lobe infarct is present. This is just anterior and medial to the areas of restricted diffusion on the most recent MRI scan. More remote encephalomalacia is present inferiorly in the left frontal lobe. The subacute posterior left frontal lobe infarct is also noted with some suggestion of cortical laminar necrosis. White matter changes are present on the right without cortical infarct. Basal ganglia are intact. The brainstem and cerebellum are within normal limits. Vascular: Dense atherosclerotic calcifications are present within the cavernous internal carotid  arteries bilaterally and at the dural margin of both vertebral arteries. No hyperdense vessel is present. Skull: Calvarium is intact. No focal lytic or blastic lesions are present. Sinuses/Orbits: The paranasal sinuses and mastoid air cells are clear. Bilateral lens replacements are present. Globes and orbits are within normal limits. IMPRESSION: 1. Progressive medial anterior left frontal lobe infarct, likely within the ACA territory. 2. Subacute posterior left frontal lobe infarct. 3. Stable remote left frontal lobe encephalomalacia. 4.  Atherosclerosis. Electronically Signed   By: San Morelle M.D.   On: 05/15/2018 16:05    EKG: Not done  Assessment/Plan Principal Problem:   Acute encephalopathy Active Problems:   HCAP (healthcare-associated pneumonia)   CAD (coronary artery disease)   Atrial fibrillation, chronic   Diabetes mellitus type 2 with peripheral artery disease (HCC)   Hyperlipidemia   Acute on chronic congestive heart failure ([place])   Essential hypertension   Type II diabetes mellitus with renal manifestations (HCC)   Severe aortic stenosis   S/P TAVR (transcatheter aortic valve replacement)   CKD (chronic kidney disease) stage 4, GFR 15-29 ml/min (HCC)   Acute on chronic systolic CHF (congestive heart failure) (HCC)   CVA (cerebral vascular accident) ([place])   Right sided weakness   Aspiration pneumonia ([place])   Acute renal failure superimposed on stage 4 chronic kidney disease ([place])   1 acute encephalopathy Likely multifactorial secondary to probable healthcare associated pneumonia, acute on chronic systolic heart failure, and progressive medial anterior left frontal lobe infarct likely within the ACA territory as noted per head CT.  Patient more alert at my time of interview per ED physician.  Per ED physician family wants reversible things treated and did not seem to want any further stroke work-up at this time.  Per discharge summary from 05/11/2018 neurology and stroke team follow the patient during the hospitalization and recommended no further stroke work-up and to continue anticoagulation with Eliquis and unfortunately there was no data suggesting any benefit to switching to an alternative anticoagulant or addition of aspirin and patient to follow-up with neurology in the outpatient setting.  The ED physician family had contacted hospice of [facility] who are coming to assess the patient in the ED.  We will check a sputum Gram stain and culture, stroke swallow evaluation, urine Legionella antigen,  urine pneumococcus antigen, blood cultures x2, UA with cultures and sensitivities.  Check a procalcitonin level.  Will place patient empirically on IV Zosyn due to concerns for aspiration.  SLP evaluation.  Keep patient n.p.o. until has been seen by speech therapy.  Placed on Lasix 20 mg IV daily.  Strict I's and O's.  Daily weights.  Follow.  2.  Probable healthcare pneumonia versus aspiration pneumonia Pneumonia concerning on chest x-ray.  Patient had presented with acute encephalopathy.  Patient noted to have eating a lot at breakfast and there was some concern of aspiration pneumonia as patient symptoms started after she took a nap after breakfast.  Check a sputum Gram stain and culture, urine Legionella antigen, and urine pneumococcus antigen, blood cultures x2.  Place empirically on IV Zosyn, icing, scheduled nebs.  Follow.  3.  Acute on chronic systolic heart failure Chest x-ray concerning for increasing vascular congestion.  Patient presented with increased O2 requirements and shortness of breath.  Placed on Lasix 20 mg IV daily.  Strict I's and O's.  Daily weights.  Follow.  4.  Acute on chronic renal failure stage IV Patient noted to have creatinine fluctuating between 1.6 to about  2.  Chest x-ray concerning for volume overload/vascular congestion increasing.  Creatinine of 2.04 from 1.85 on discharge on 05/11/2018.  Check a UA with cultures and sensitivities.  Check a urine sodium.  Check a urine creatinine.  Placed on Lasix 20 mg IV daily.  Strict I's and O's.  Follow.  5.  Probable progressive CVA Noted on CT head.  Per last discharge summary from 05/11/2018 neurology assess patient during that hospitalization recommended no further stroke work-up and patient to follow-up with neurology in the outpatient setting.  Was recommended to continue Eliquis.  Per ED physician family wanted to only treat reversible things at this time and not interested in any further invasive work-up.  Family noted to  have contacted hospice of [facility] who are coming to evaluate the patient in the ED.  Continue Eliquis for now for secondary stroke prophylaxis.  SLP evaluation.  Palliative care consultation for goals of care.  6.  Chronic A. fib status post TAVR Placed on IV Lopressor for rate control until patient is evaluated by speech therapy.  Apixaban for anticoagulation.  7.  Hypertension IV Lopressor.  Follow.  8.  Type II Hemoglobin A1c was 7.2 during last hospitalization.  Placed on sliding scale insulin.  Hold NPH for now.  Follow.  9.  Normocytic anemia No overt bleeding.  Hemoglobin currently stable at 8.3.  Follow H&H.  10.  Gastroesophageal reflux disease PPI.  11.  Temporal headache Patient noted to have a history of temporal arteritis diagnosis and was initiated on prednisone.  Per discharge summary recommendations were to undergo a prednisone taper by 10 mg every week until off.  Until speech has assessed patient will place on IV Solu-Medrol 40 mg daily.  12.  Prognosis Patient with a poor prognosis.  Patient with recent multiple hospitalizations for recurrent strokes.  Patient with a right hemiparesis and aphasic.  Patient presenting with worsening shortness of breath concerning for aspiration pneumonia and volume overload.  It is noted per ED physician that family had initiated a call to hospice of [facility] who are coming to assess the patient in the ED.  Consult palliative care for goals of care.    DVT prophylaxis: Eliquis Code Status: DNR Family Communication: No family at bedside. Disposition Plan: Likely home once clinically stable probably with hospice. Consults called: Palliative care. Admission status: Admit to inpatient.   Irine Seal MD [facility]  If 7PM-7AM, please contact night-coverage [email or web address]  05/15/2018, 8:19 PM

## 2018-05-16 ENCOUNTER — Ambulatory Visit: Payer: Medicare Other | Admitting: Cardiology

## 2018-05-16 DIAGNOSIS — Z515 Encounter for palliative care: Secondary | ICD-10-CM

## 2018-05-16 DIAGNOSIS — J690 Pneumonitis due to inhalation of food and vomit: Principal | ICD-10-CM

## 2018-05-16 DIAGNOSIS — Z7189 Other specified counseling: Secondary | ICD-10-CM

## 2018-05-16 DIAGNOSIS — G9340 Encephalopathy, unspecified: Secondary | ICD-10-CM

## 2018-05-16 LAB — CBC
HCT: 28.7 % — ABNORMAL LOW (ref 36.0–46.0)
Hemoglobin: 8.3 g/dL — ABNORMAL LOW (ref 12.0–15.0)
MCH: 26.9 pg (ref 26.0–34.0)
MCHC: 28.9 g/dL — ABNORMAL LOW (ref 30.0–36.0)
MCV: 93.2 fL (ref 80.0–100.0)
Platelets: 132 10*3/uL — ABNORMAL LOW (ref 150–400)
RBC: 3.08 MIL/uL — ABNORMAL LOW (ref 3.87–5.11)
RDW: 20.4 % — ABNORMAL HIGH (ref 11.5–15.5)
WBC: 10.5 10*3/uL (ref 4.0–10.5)
nRBC: 0 % (ref 0.0–0.2)

## 2018-05-16 LAB — URINALYSIS, ROUTINE W REFLEX MICROSCOPIC
Bilirubin Urine: NEGATIVE
Glucose, UA: NEGATIVE mg/dL
Ketones, ur: NEGATIVE mg/dL
Nitrite: NEGATIVE
Protein, ur: >=300 mg/dL — AB
Specific Gravity, Urine: 1.016 (ref 1.005–1.030)
pH: 5 (ref 5.0–8.0)

## 2018-05-16 LAB — GLUCOSE, CAPILLARY
Glucose-Capillary: 240 mg/dL — ABNORMAL HIGH (ref 70–99)
Glucose-Capillary: 219 mg/dL — ABNORMAL HIGH (ref 70–99)
Glucose-Capillary: 217 mg/dL — ABNORMAL HIGH (ref 70–99)
Glucose-Capillary: 251 mg/dL — ABNORMAL HIGH (ref 70–99)
Glucose-Capillary: 208 mg/dL — ABNORMAL HIGH (ref 70–99)

## 2018-05-16 LAB — APTT
APTT: 29 s (ref 24–36)
aPTT: 65 seconds — ABNORMAL HIGH (ref 24–36)

## 2018-05-16 LAB — STREP PNEUMONIAE URINARY ANTIGEN: Strep Pneumo Urinary Antigen: NEGATIVE

## 2018-05-16 LAB — PROTIME-INR
INR: 1.49
Prothrombin Time: 17.8 seconds — ABNORMAL HIGH (ref 11.4–15.2)

## 2018-05-16 LAB — BASIC METABOLIC PANEL
Anion gap: 15 (ref 5–15)
BUN: 104 mg/dL — ABNORMAL HIGH (ref 8–23)
CO2: 15 mmol/L — ABNORMAL LOW (ref 22–32)
Calcium: 7.8 mg/dL — ABNORMAL LOW (ref 8.9–10.3)
Chloride: 104 mmol/L (ref 98–111)
Creatinine, Ser: 2.32 mg/dL — ABNORMAL HIGH (ref 0.44–1.00)
GFR calc Af Amer: 22 mL/min — ABNORMAL LOW (ref >60)
GFR calc non Af Amer: 19 mL/min — ABNORMAL LOW (ref >60)
Glucose, Bld: 252 mg/dL — ABNORMAL HIGH (ref 70–99)
Potassium: 4.8 mmol/L (ref 3.5–5.1)
Sodium: 134 mmol/L — ABNORMAL LOW (ref 135–145)

## 2018-05-16 LAB — HEPARIN LEVEL (UNFRACTIONATED): Heparin Unfractionated: >2.2 IU/mL — ABNORMAL HIGH (ref 0.30–0.70)

## 2018-05-16 LAB — PROCALCITONIN: Procalcitonin: 0.32 ng/mL

## 2018-05-16 LAB — HIV ANTIBODY (ROUTINE TESTING W REFLEX): HIV Screen 4th Generation wRfx: NONREACTIVE

## 2018-05-16 LAB — MRSA PCR SCREENING: MRSA by PCR: POSITIVE — AB

## 2018-05-16 MED ORDER — HEPARIN (PORCINE) 25000 UT/250ML-% IV SOLN
950.0000 [IU]/h | INTRAVENOUS | Status: DC
  Administered 2018-05-16: 950 [IU]/h via INTRAVENOUS
  Filled 2018-05-16: qty 250

## 2018-05-16 MED ORDER — CHLORHEXIDINE GLUCONATE CLOTH 2 % EX PADS
6.0000 | MEDICATED_PAD | Freq: Every day | CUTANEOUS | Status: DC
  Administered 2018-05-16 – 2018-05-18 (×3): 6 via TOPICAL

## 2018-05-16 MED ORDER — SODIUM BICARBONATE 8.4 % IV SOLN
INTRAVENOUS | Status: DC
  Administered 2018-05-16 – 2018-05-18 (×5): via INTRAVENOUS
  Filled 2018-05-16 (×5): qty 150

## 2018-05-16 MED ORDER — MUPIROCIN 2 % EX OINT
1.0000 "application " | TOPICAL_OINTMENT | Freq: Two times a day (BID) | CUTANEOUS | Status: DC
  Administered 2018-05-16 – 2018-05-18 (×6): 1 via NASAL
  Filled 2018-05-16: qty 22

## 2018-05-16 NOTE — Evaluation (Signed)
Occupational Therapy Evaluation Patient Details Name: Kristina Summers MRN: [id number] DOB: November 30, 1935 Today's Date: 05/16/2018    History of Present Illness Pt was admitted with acute encephalopathy; probable progression of recent CVA.  PMH:  recent CVA (1/20), DM, AFib, TAVR, CAD, CHF, HTN, CABG, CKD.     Clinical Impression   This 83 year old female was admitted for the above.  Prior to admission, she was at [facility] and participating with OT.  Pt's daughter, Kristina Summers, is an Therapist, sports here, and was present during evaluation.  Plan is for home with another daughter who was a CNA.  Pt was total A for adls and did not follow commands except to turn head to speaker, when asked.  RUE has a lot of edema, which [name] reports as decreased. Will continue to assist with edema management as well as promoting participation in light adls and bed mobility as pt is able.    Follow Up Recommendations  (home hospice)    Equipment Recommendations  None recommended by OT    Recommendations for Other Services       Precautions / Restrictions Precautions Precautions: Fall Precaution Comments: severe aphasia, legally blind, HOH Restrictions Weight Bearing Restrictions: No      Mobility Bed Mobility               General bed mobility comments: total A; did not initiate any movement  Transfers                      Balance       Sitting balance - Comments: not tested today                                   ADL either performed or assessed with clinical judgement   ADL                                         General ADL Comments: total A for all ADLs, +2 for LB/rolling.  Did not attempt to hold comb with hand over hand assist.       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  Did not follow through with hand over hand  assist    Pertinent Vitals/Pain Pain Assessment: Faces Faces Pain Scale: Hurts little more Pain Location: R shoulder during ROM assessment Pain Descriptors / Indicators: Grimacing Pain Intervention(s): Repositioned;Limited activity within patient's tolerance     Hand Dominance Right   Extremity/Trunk Assessment Upper Extremity Assessment RUE Deficits / Details: edema throughout arm.  Performed light retrograde massage and elevated further LUE Deficits / Details: did not move on her own nor to command.  Arm was not heavy during ROM assessment.   Observed muscles twitching. Noted pt to be moving LLE volitionally.             Communication Communication Communication: Expressive difficulties;Receptive difficulties   Cognition Arousal/Alertness: Awake/alert Behavior During Therapy: WFL for tasks assessed/performed Overall Cognitive Status: Aphasic                                 General Comments: pt only followed commands to  turn head to look at therapist and daughter.     General Comments       Exercises     Shoulder Instructions      Home Living Family/patient expects to be discharged to:: Private residence                                 Additional Comments: plans home to [facility] apt with family assist and hospice      Prior Functioning/Environment          Comments: was at [facility]; participated in therapy        OT Problem List: Decreased strength;Decreased range of motion;Pain;Impaired UE functional use;Decreased activity tolerance;Impaired vision/perception;Increased edema(balance NT)      OT Treatment/Interventions: Self-care/ADL training;Therapeutic exercise;Balance training;Patient/family education;Therapeutic activities;DME and/or AE instruction;Neuromuscular education(edema management)    OT Goals(Current goals can be found in the care plan section) Acute Rehab OT Goals Patient Stated Goal: none stated OT Goal  Formulation: With family Time For Goal Achievement: 05/30/18 Potential to Achieve Goals: Fair ADL Goals Additional ADL Goal #1: pt will roll to R with max A using bedrails and hand over hand assist to initiate for adls Additional ADL Goal #2: pt will participate in light grooming tasks with LUE max A, given hand over hand assist to initiate movement  OT Frequency: Min 2X/week   Barriers to D/C:            Co-evaluation              AM-PAC OT "6 Clicks" Daily Activity     Outcome Measure Help from another person eating meals?: Total Help from another person taking care of personal grooming?: Total Help from another person toileting, which includes using toliet, bedpan, or urinal?: Total Help from another person bathing (including washing, rinsing, drying)?: Total Help from another person to put on and taking off regular upper body clothing?: Total Help from another person to put on and taking off regular lower body clothing?: Total 6 Click Score: 6   End of Session    Activity Tolerance:   Patient left: in bed;with call bell/phone within reach;with family/visitor present  OT Visit Diagnosis: Muscle weakness (generalized) (M62.81);Hemiplegia and hemiparesis Symptoms and signs involving cognitive functions: Cerebral infarction Hemiplegia - Right/Left: Right Hemiplegia - dominant/non-dominant: Dominant Hemiplegia - caused by: Cerebral infarction                Time: 7322-0254 OT Time Calculation (min): 19 min Charges:  OT General Charges $OT Visit: 1 Visit OT Evaluation $OT Eval Low Complexity: [place], OTR/L Acute Rehabilitation Services [phone] WL pager [phone] office 05/16/2018  [place] 05/16/2018, 3:15 PM

## 2018-05-16 NOTE — Progress Notes (Signed)
Received a call that patient had a +MRSA PCR. Per protocol, orders were placed for Bactroban and CHG wipes.  Will alert the family of results.

## 2018-05-16 NOTE — Progress Notes (Signed)
PROGRESS NOTE    Kristina Summers  TOI:[id number] DOB: 09-18-1935 DOA: 05/15/2018 PCP: Patient, No Pcp Per    Brief Narrative:  Patient is a 83 year old female history of diabetes, A. fib on Eliquis, status post TAVR 11/08/2017, hypertension, chronic diastolic heart failure, coronary artery disease status post CABG recently hospitalized from 05/08/2018 to 05/11/2018 for acute CVA with resultant pseudobulbar speech and right hemiparesis and discharged on apixaban.  Patient presented back with altered mental status.  Concern for progressive recurrent CVA and aspiration pneumonia.  Patient admitted placed on IV antibiotics and goal was to treat the reversible things.  Palliative care consultation pending for goals of care.   Assessment & Plan:   Principal Problem:   Acute encephalopathy Active Problems:   HCAP (healthcare-associated pneumonia)   CAD (coronary artery disease)   Atrial fibrillation, chronic   Diabetes mellitus type 2 with peripheral artery disease (HCC)   Hyperlipidemia   Acute on chronic congestive heart failure (HCC)   Essential hypertension   Type II diabetes mellitus with renal manifestations (HCC)   Severe aortic stenosis   S/P TAVR (transcatheter aortic valve replacement)   CKD (chronic kidney disease) stage 4, GFR 15-29 ml/min (HCC)   Acute on chronic systolic CHF (congestive heart failure) (HCC)   CVA (cerebral vascular accident) ([place])   Right sided weakness   Aspiration pneumonia (HCC)   Acute renal failure superimposed on stage 4 chronic kidney disease ([place])   1 acute encephalopathy Likely multifactorial secondary to probable healthcare associated pneumonia, ?? acute on chronic systolic heart failure, and progressive medial anterior left frontal lobe infarct likely within the ACA territory as noted per head CT.  Patient more alert at my time of interview per ED physician.  Per ED physician family wants reversible things treated and did not seem to want any further  stroke work-up at this time.  Per discharge summary from 05/11/2018 neurology and stroke team follow the patient during the hospitalization and recommended no further stroke work-up and to continue anticoagulation with Eliquis and unfortunately there was no data suggesting any benefit to switching to an alternative anticoagulant or addition of aspirin and patient to follow-up with neurology in the outpatient setting.  Per ED physician, family had contacted hospice of [facility] who are coming to assess the patient in the ED.   sputum Gram stain and cultures pending.  SLP evaluation pending.  Urine pneumococcus antigen negative.  Urine Legionella antigen pending.  Blood cultures pending.  Continue empiric IV Zosyn.  Patient was started on IV Lasix due to concerns of acute on chronic CHF exacerbation however due to worsening renal function will discontinue IV Lasix and place on gentle hydration.  Follow.  2.  Probable healthcare pneumonia versus aspiration pneumonia Pneumonia concerning on chest x-ray.  Patient had presented with acute encephalopathy.  Patient noted to have eating a lot at breakfast and there was some concern of aspiration pneumonia as patient symptoms started after she took a nap after breakfast.    Sputum Gram stain and culture pending.  Urine strep pneumococcus antigen negative.  Urine Legionella antigen pending.  Blood cultures pending.  Continue empiric IV Zosyn.  Scheduled nebs.  Follow.    3.  ?? Acute on chronic systolic heart failure Chest x-ray concerning for increasing vascular congestion.  Patient presented with increased O2 requirements and shortness of breath.  Patient given a dose of IV Lasix however due to worsening renal function will discontinue IV Lasix.  Continue empiric IV antibiotics.  Strict I's  and O's.  Daily weights.   4.  Acute on chronic renal failure stage IV Patient noted to have creatinine fluctuating between 1.6 to about 2.  Chest x-ray concerning for volume  overload/vascular congestion increasing.  Creatinine of 2.04 from 1.85 on discharge on 05/11/2018.    Urinalysis with trace leukocytes, nitrite negative, greater than 300 protein, 21-50 WBCs.  Urine cultures pending.  Patient given a dose of IV Lasix with worsening renal function.  Discontinue IV Lasix and placed on gentle hydration.  Follow.  5.  Probable progressive CVA Noted on CT head.  Per last discharge summary from 05/11/2018 neurology assess patient during that hospitalization recommended no further stroke work-up and patient to follow-up with neurology in the outpatient setting.  Was recommended to continue Eliquis.  Per ED physician family wanted to only treat reversible things at this time and not interested in any further invasive work-up.  Family noted to have contacted hospice of [facility] who came to evaluate patient in the ED.  Patient currently n.p.o. and not swallowing per RN.  Discontinue Eliquis and placed on IV heparin for secondary stroke prophylaxis for now pending palliative care consultation for goals of care.   6.  Chronic A. fib status post TAVR Patient currently n.p.o.  Continue IV Lopressor for rate control.  Per RN patient not swallowing.  Discontinue apixaban and placed on IV heparin for anticoagulation pending palliative care evaluation.    7.  Hypertension Continue IV Lopressor.  Follow.  8.  Type II Hemoglobin A1c was 7.2 during last hospitalization.  Patient currently n.p.o.  CBG of 219 this morning.  Place on Lantus 5 units daily.  Continue sliding scale insulin.   9.  Normocytic anemia No overt bleeding.  Hemoglobin currently stable at 8.3.  Follow H&H.  10.  Gastroesophageal reflux disease Continue PPI.  11.  Temporal headache Patient noted to have a history of temporal arteritis diagnosis and was initiated on prednisone.  Per discharge summary recommendations were to undergo a prednisone taper by 10 mg every week until off.  Continue IV Solu-Medrol as  patient currently n.p.o.  12.  Right upper extremity swelling Prior to admission was noted that patient may have had a IV infiltration in the right upper extremity.  Patient on full dose anticoagulation.  Place right upper extremity admission sling.  Elevate.  13.  Metabolic acidosis Likely secondary to worsening renal function.  Place on a bicarb drip at 75 cc/h.  Follow.  14.  Prognosis Patient with a poor prognosis.  Patient with recent multiple hospitalizations for recurrent strokes.  Patient with a right hemiparesis and aphasic.  Patient presenting with worsening shortness of breath concerning for aspiration pneumonia and volume overload.  It is noted per ED physician that family had initiated a call to hospice of [facility] who are coming to assess the patient in the ED. palliative care consultation pending for goals of care.  Patient likely home with hospice once clinically stable and treatment of reversible medical issues.   DVT prophylaxis: IV heparin Code Status: DNR Family Communication: Updated daughter. Disposition Plan: Likely home with hospice pending palliative care evaluation and clinical improvement.   Consultants:   Palliative care pending  Procedures:   CT head 05/15/2018  Chest x-ray 05/15/2018  Antimicrobials:   IV cefepime 05/15/2018 x 1 dose  IV clindamycin x1 05/15/2018  IV Zosyn 05/16/2018  IV vancomycin 05/15/2018   Subjective: Patient alert.  Aphasic.  Patient with poor oral intake and per nursing seems difficulty  with swallowing.   Objective: Vitals:   05/15/18 2202 05/15/18 2223 05/16/18 0614 05/16/18 0839  BP:  (!) 175/60 (!) 179/68   Pulse:  78 66   Resp:  18 20   Temp:  97.6 F (36.4 C) 97.9 F (36.6 C)   TempSrc:   Oral   SpO2: 96% 97% 99% 98%  Weight:      Height:        Intake/Output Summary (Last 24 hours) at 05/16/2018 1247 Last data filed at 05/16/2018 0116 Gross per 24 hour  Intake -  Output 500 ml  Net -500 ml   Filed  Weights   05/15/18 1900  Weight: 87.8 kg    Examination:  General exam: Alert. Respiratory system: Coarse breath sounds anterior lung fields.  No wheezing.  Normal respiratory effort.  Cardiovascular system: S1 & S2 heard, RRR. No JVD, murmurs, rubs, gallops or clicks. No pedal edema. Gastrointestinal system: Abdomen is nondistended, soft and nontender. No organomegaly or masses felt. Normal bowel sounds heard. Central nervous system: Alert.  Aphasic.  Right hemiparesis.  Moving left lower extremity spontaneously left upper extremity.   Extremities: Right upper extremity swelling.  Skin: No rashes, lesions or ulcers Psychiatry: Judgement and insight appear normal. Mood & affect appropriate.     Data Reviewed: I have personally reviewed following labs and imaging studies  CBC: Recent Labs  Lab 05/10/18 0757 05/10/18 1926 05/11/18 0511 05/15/18 1450 05/16/18 0508  WBC 13.3* 12.0* 14.5* 12.4* 10.5  NEUTROABS  --   --   --  12.0*  --   HGB 6.9* 8.2* 8.3* 8.3* 8.3*  HCT 23.4* 27.3* 27.9* 27.8* 28.7*  MCV 88.6 88.9 89.7 93.0 93.2  PLT 144* 152 141* 120* 993*   Basic Metabolic Panel: Recent Labs  Lab 05/11/18 0511 05/15/18 1450 05/15/18 1928 05/16/18 0508  NA 139 134*  --  134*  K 3.9 4.6  --  4.8  CL 109 105  --  104  CO2 22 19*  --  15*  GLUCOSE 315* 217*  --  252*  BUN 65* 98*  --  104*  CREATININE 1.85* 2.04*  --  2.32*  CALCIUM 8.1* 7.7*  --  7.8*  MG  --   --  2.6*  --    GFR: Estimated Creatinine Clearance: 21.6 mL/min (A) (by C-G formula based on SCr of 2.32 mg/dL (H)). Liver Function Tests: Recent Labs  Lab 05/15/18 1450  AST 43*  ALT 38  ALKPHOS 142*  BILITOT 2.9*  PROT 6.0*  ALBUMIN 2.3*   No results for input(s): LIPASE, AMYLASE in the last 168 hours. No results for input(s): AMMONIA in the last 168 hours. Coagulation Profile: Recent Labs  Lab 05/16/18 0508  INR 1.49   Cardiac Enzymes: No results for input(s): CKTOTAL, CKMB, CKMBINDEX,  TROPONINI in the last 168 hours. BNP (last 3 results) No results for input(s): PROBNP in the last 8760 hours. HbA1C: No results for input(s): HGBA1C in the last 72 hours. CBG: Recent Labs  Lab 05/11/18 0651 05/11/18 1129 05/16/18 0102 05/16/18 0609 05/16/18 1146  GLUCAP 283* 229* 240* 219* 217*   Lipid Profile: No results for input(s): CHOL, HDL, LDLCALC, TRIG, CHOLHDL, LDLDIRECT in the last 72 hours. Thyroid Function Tests: No results for input(s): TSH, T4TOTAL, FREET4, T3FREE, THYROIDAB in the last 72 hours. Anemia Panel: No results for input(s): VITAMINB12, FOLATE, FERRITIN, TIBC, IRON, RETICCTPCT in the last 72 hours. Sepsis Labs: Recent Labs  Lab 05/15/18 1455 05/15/18 1529 05/15/18  1928 05/16/18 0508  PROCALCITON  --  0.31  --  0.32  LATICACIDVEN 1.2  --  1.9  --     Recent Results (from the past 240 hour(s))  Blood culture (routine x 2)     Status: None (Preliminary result)   Collection Time: 05/15/18  2:50 PM  Result Value Ref Range Status   Specimen Description   Final    BLOOD LEFT WRIST Performed at [facility] [address] [place], [place] [address]    Special Requests   Final    BOTTLES DRAWN AEROBIC AND ANAEROBIC Blood Culture adequate volume Performed at [facility] [address] [place], [place] [address]    Culture   Final    NO GROWTH < 24 HOURS Performed at [facility] [address], [place], [place] [address]    Report Status PENDING  Incomplete  Blood culture (routine x 2)     Status: None (Preliminary result)   Collection Time: 05/15/18  2:50 PM  Result Value Ref Range Status   Specimen Description   Final    BLOOD LEFT ANTECUBITAL Performed at [facility] [address] [place], [place] [address]    Special Requests   Final    BOTTLES DRAWN AEROBIC AND ANAEROBIC Blood Culture results may not be optimal due to an excessive volume of blood received in culture  bottles Performed at [facility] [address], [place], [place] [address]    Culture   Final    NO GROWTH < 24 HOURS Performed at [facility] [address], [place], [place] [address]    Report Status PENDING  Incomplete  MRSA PCR Screening     Status: Abnormal   Collection Time: 05/16/18  8:57 AM  Result Value Ref Range Status   MRSA by PCR POSITIVE (A) NEGATIVE Final    Comment:        The GeneXpert MRSA Assay (FDA approved for NASAL specimens only), is one component of a comprehensive MRSA colonization surveillance program. It is not intended to diagnose MRSA infection nor to guide or monitor treatment for MRSA infections. RESULT CALLED TO, READ BACK BY AND VERIFIED WITH: Richelle Ito [id number] @ [phone] Performed at [facility] [address], [place], [place] [address]          Radiology Studies: Dg Chest 1 View  Result Date: 05/15/2018 CLINICAL DATA:  Shortness of Breath EXAM: CHEST  1 VIEW COMPARISON:  04/10/2018 FINDINGS: Cardiac shadow is mildly enlarged. Changes of coronary bypass grafting and prior TAVR are noted. Increased central vascular congestion is noted with patchy infiltrate most prominent within the midportion of the right lung. Small right-sided pleural effusion is noted. No acute bony abnormality is seen. Chronic changes in the proximal right humerus are noted. IMPRESSION: Vascular congestion increased from the prior exam. Patchy infiltrative opacities are noted bilaterally worst in the midportion of the right lung. Electronically Signed   By: Inez Catalina M.D.   On: 05/15/2018 15:51   Ct Head Wo Contrast  Result Date: 05/15/2018 CLINICAL DATA:  Acute altered level of consciousness. Stroke 1 week ago. The EXAM: CT HEAD WITHOUT CONTRAST TECHNIQUE: Contiguous axial images were obtained from the base of the skull through the vertex without intravenous contrast. COMPARISON:  CT head without  contrast 05/08/2018. MRI brain without contrast 05/08/2018 FINDINGS: Brain: A progressive medial left frontal lobe infarct is present. This  is just anterior and medial to the areas of restricted diffusion on the most recent MRI scan. More remote encephalomalacia is present inferiorly in the left frontal lobe. The subacute posterior left frontal lobe infarct is also noted with some suggestion of cortical laminar necrosis. White matter changes are present on the right without cortical infarct. Basal ganglia are intact. The brainstem and cerebellum are within normal limits. Vascular: Dense atherosclerotic calcifications are present within the cavernous internal carotid arteries bilaterally and at the dural margin of both vertebral arteries. No hyperdense vessel is present. Skull: Calvarium is intact. No focal lytic or blastic lesions are present. Sinuses/Orbits: The paranasal sinuses and mastoid air cells are clear. Bilateral lens replacements are present. Globes and orbits are within normal limits. IMPRESSION: 1. Progressive medial anterior left frontal lobe infarct, likely within the ACA territory. 2. Subacute posterior left frontal lobe infarct. 3. Stable remote left frontal lobe encephalomalacia. 4. Atherosclerosis. Electronically Signed   By: San Morelle M.D.   On: 05/15/2018 16:05        Scheduled Meds: . Chlorhexidine Gluconate Cloth  6 each Topical Q0600  . collagenase  1 application Topical Daily  . insulin aspart  0-9 Units Subcutaneous Q6H  . levalbuterol  0.63 mg Nebulization TID  . methylPREDNISolone (SOLU-MEDROL) injection  40 mg Intravenous Daily  . metoprolol tartrate  5 mg Intravenous Q6H  . mupirocin ointment  1 application Nasal BID  . pantoprazole (PROTONIX) IV  40 mg Intravenous Q24H  . sodium chloride flush  3 mL Intravenous Q12H  . sodium chloride flush  3 mL Intravenous Q12H   Continuous Infusions: . sodium chloride    . piperacillin-tazobactam (ZOSYN)  IV 3.375 g  (05/16/18 0803)  .  sodium bicarbonate  infusion 1000 mL 100 mL/hr at 05/16/18 0907  . [START ON 05/17/2018] vancomycin       LOS: 1 day    Time spent: 35 minutes    Irine Seal, MD [facility]  If 7PM-7AM, please contact night-coverage [email or web address] 05/16/2018, 12:47 PM

## 2018-05-16 NOTE — Progress Notes (Signed)
PT Cancellation Note/Discharge  Patient Details Name: Kristina Summers MRN: [id number] DOB: 12-22-1935   Cancelled Treatment:    Reason Eval/Treat Not Completed: Other (comment).  Per RN pt not following commands and is total care.  Opens eyes, but weakly.  RN reports that pt is to go home with home hospice care.  Please re-order PT if pt's ability to participate in therapy improves.  She will need ambulance transport home.   Thanks,  Barbarann Ehlers. Paarth Cropper, PT, DPT  Acute Rehabilitation [phone] pager [phone]) [phone] office     [name] 05/16/2018, 5:01 PM

## 2018-05-16 NOTE — Progress Notes (Signed)
[place] for heparin Indication:hx atrial fibrillation (home Eliquis on hold)  Allergies  Allergen Reactions  . Morphine And Related Nausea And Vomiting and Other (See Comments)    Chest pain, also   . Lisinopril Cough  . Zoloft [Sertraline Hcl] Nausea Only         Patient Measurements: Height: 5\' 6"  (167.6 cm) Weight: 193 lb 9 oz (87.8 kg) IBW/kg (Calculated) : 59.3 Heparin Dosing Weight: 78 kg  Vital Signs: Temp: 97.9 F (36.6 C) (01/28 0614) Temp Source: Oral (01/28 0614) BP: 179/68 (01/28 8366) Pulse Rate: 66 (01/28 0614)  Labs: Recent Labs    05/15/18 1450 05/16/18 0508  HGB 8.3* 8.3*  HCT 27.8* 28.7*  PLT 120* 132*  LABPROT  --  17.8*  INR  --  1.49  CREATININE 2.04* 2.32*    Estimated Creatinine Clearance: 21.6 mL/min (A) (by C-G formula based on SCr of 2.32 mg/dL (H)).   Medications:  - on Eliquis 2.5 mg BID PTA (last dose taken on 1/27 at 0935)  Assessment: Patient's an 83 y.o F with hx afib and recent CVA (1/20) on Eliquis PTA, presented to the ED from nursing facility on 05/15/18 with AMS.  Head CTA on 1/27 showed "Progressive medial anterior left frontal lobe infarct, likely within the ACA territory." To transition from Eliquis to heparin drip since pt's NPO.  Goal of Therapy:  Heparin level 0.3-0.5 units/ml (d/t recent CVA) Monitor platelets by anticoagulation protocol: Yes   Plan:  - baseline heparin level and aPTT now - start heparin drip at 950 units/hr (no bolus) - check 8 hr heparin level and aPTT - monitor for s/s bleeding  Ammarie Matsuura P 05/16/2018,1:06 PM

## 2018-05-16 NOTE — Progress Notes (Signed)
SLP Cancellation Note  Patient Details Name: Kristina Summers MRN: [id number] DOB: [birth year]   Cancelled treatment:        c/s pt NPO with aggressive treatment for PNA. To discharge home with hospice   Charlynne Cousins Lunna Vogelgesang 05/16/2018, 11:43 AM

## 2018-05-16 NOTE — Progress Notes (Signed)
Inpatient Diabetes Program Recommendations  AACE/ADA: New Consensus Statement on Inpatient Glycemic Control (2015)  Target Ranges:  Prepandial:   less than 140 mg/dL      Peak postprandial:   less than 180 mg/dL (1-2 hours)      Critically ill patients:  140 - 180 mg/dL   Lab Results  Component Value Date   GLUCAP 217 (H) 05/16/2018   HGBA1C 7.2 (H) 04/15/2018    Review of Glycemic Control  Diabetes history: DM2 Outpatient Diabetes medications: NPH 20 in am and 10 units QHS, Novolog 0-15 units tidwc, tradjenta 5 mg QD Current orders for Inpatient glycemic control: Novolog 0-9 units Q6H.  HgbA1C - 7.2%. Would benefit from addition of basal insulin.  Inpatient Diabetes Program Recommendations:     Add Lantus 10 units QHS  Continue to follow.  Thank you. Lorenda Peck, RD, LDN, CDE Inpatient Diabetes Coordinator [phone]

## 2018-05-17 ENCOUNTER — Ambulatory Visit (HOSPITAL_COMMUNITY): Payer: Medicare Other

## 2018-05-17 ENCOUNTER — Inpatient Hospital Stay (HOSPITAL_COMMUNITY): Payer: Medicare HMO

## 2018-05-17 DIAGNOSIS — N179 Acute kidney failure, unspecified: Secondary | ICD-10-CM

## 2018-05-17 DIAGNOSIS — N184 Chronic kidney disease, stage 4 (severe): Secondary | ICD-10-CM

## 2018-05-17 LAB — GLUCOSE, CAPILLARY
GLUCOSE-CAPILLARY: 211 mg/dL — AB (ref 70–99)
Glucose-Capillary: 241 mg/dL — ABNORMAL HIGH (ref 70–99)
Glucose-Capillary: 260 mg/dL — ABNORMAL HIGH (ref 70–99)
Glucose-Capillary: 376 mg/dL — ABNORMAL HIGH (ref 70–99)

## 2018-05-17 LAB — PROCALCITONIN: Procalcitonin: 0.24 ng/mL

## 2018-05-17 LAB — CBC WITH DIFFERENTIAL/PLATELET
Abs Immature Granulocytes: 0.13 10*3/uL — ABNORMAL HIGH (ref 0.00–0.07)
Basophils Absolute: 0 10*3/uL (ref 0.0–0.1)
Basophils Relative: 0 %
Eosinophils Absolute: 0 10*3/uL (ref 0.0–0.5)
Eosinophils Relative: 0 %
HCT: 26.7 % — ABNORMAL LOW (ref 36.0–46.0)
Hemoglobin: 7.7 g/dL — ABNORMAL LOW (ref 12.0–15.0)
IMMATURE GRANULOCYTES: 1 %
Lymphocytes Relative: 1 %
Lymphs Abs: 0.2 10*3/uL — ABNORMAL LOW (ref 0.7–4.0)
MCH: 26.5 pg (ref 26.0–34.0)
MCHC: 28.8 g/dL — ABNORMAL LOW (ref 30.0–36.0)
MCV: 91.8 fL (ref 80.0–100.0)
Monocytes Absolute: 0.8 10*3/uL (ref 0.1–1.0)
Monocytes Relative: 5 %
Neutro Abs: 15 10*3/uL — ABNORMAL HIGH (ref 1.7–7.7)
Neutrophils Relative %: 93 %
Platelets: 133 10*3/uL — ABNORMAL LOW (ref 150–400)
RBC: 2.91 MIL/uL — ABNORMAL LOW (ref 3.87–5.11)
RDW: 20.7 % — ABNORMAL HIGH (ref 11.5–15.5)
WBC: 16.1 10*3/uL — ABNORMAL HIGH (ref 4.0–10.5)
nRBC: 0.2 % (ref 0.0–0.2)

## 2018-05-17 LAB — BASIC METABOLIC PANEL
Anion gap: 13 (ref 5–15)
BUN: 110 mg/dL — ABNORMAL HIGH (ref 8–23)
CO2: 22 mmol/L (ref 22–32)
Calcium: 7.6 mg/dL — ABNORMAL LOW (ref 8.9–10.3)
Chloride: 102 mmol/L (ref 98–111)
Creatinine, Ser: 2.4 mg/dL — ABNORMAL HIGH (ref 0.44–1.00)
GFR calc Af Amer: 21 mL/min — ABNORMAL LOW (ref >60)
GFR calc non Af Amer: 18 mL/min — ABNORMAL LOW (ref >60)
Glucose, Bld: 241 mg/dL — ABNORMAL HIGH (ref 70–99)
Potassium: 4.3 mmol/L (ref 3.5–5.1)
Sodium: 137 mmol/L (ref 135–145)

## 2018-05-17 LAB — HEPARIN LEVEL (UNFRACTIONATED)
Heparin Unfractionated: 1.72 IU/mL — ABNORMAL HIGH (ref 0.30–0.70)
Heparin Unfractionated: 1.78 IU/mL — ABNORMAL HIGH (ref 0.30–0.70)

## 2018-05-17 LAB — APTT: aPTT: 89 seconds — ABNORMAL HIGH (ref 24–36)

## 2018-05-17 MED ORDER — HEPARIN (PORCINE) 25000 UT/250ML-% IV SOLN: 950.0000 [IU]/h | INTRAVENOUS | Status: AC

## 2018-05-17 MED ORDER — APIXABAN 2.5 MG PO TABS
2.5000 mg | ORAL_TABLET | Freq: Two times a day (BID) | ORAL | Status: DC
  Administered 2018-05-17 – 2018-05-19 (×4): 2.5 mg via ORAL
  Filled 2018-05-17 (×4): qty 1

## 2018-05-17 MED ORDER — CHLORHEXIDINE GLUCONATE 0.12 % MT SOLN
15.0000 mL | Freq: Two times a day (BID) | OROMUCOSAL | Status: DC
  Administered 2018-05-17 – 2018-05-18 (×4): 15 mL via OROMUCOSAL
  Filled 2018-05-17 (×3): qty 15

## 2018-05-17 MED ORDER — HEPARIN (PORCINE) 25000 UT/250ML-% IV SOLN: 950.0000 [IU]/h | INTRAVENOUS | Status: DC

## 2018-05-17 MED ORDER — DOXYCYCLINE HYCLATE 100 MG PO TABS
100.0000 mg | ORAL_TABLET | Freq: Two times a day (BID) | ORAL | Status: DC
  Administered 2018-05-17 – 2018-05-19 (×4): 100 mg via ORAL
  Filled 2018-05-17 (×4): qty 1

## 2018-05-17 MED ORDER — FLUCONAZOLE 100MG IVPB
100.0000 mg | INTRAVENOUS | Status: DC
  Administered 2018-05-17 – 2018-05-18 (×2): 100 mg via INTRAVENOUS
  Filled 2018-05-17 (×3): qty 50

## 2018-05-17 MED ORDER — INSULIN GLARGINE 100 UNIT/ML ~~LOC~~ SOLN
10.0000 [IU] | Freq: Every day | SUBCUTANEOUS | Status: DC
  Administered 2018-05-17 – 2018-05-19 (×3): 10 [IU] via SUBCUTANEOUS
  Filled 2018-05-17 (×3): qty 0.1

## 2018-05-17 MED ORDER — BOOST / RESOURCE BREEZE PO LIQD CUSTOM
1.0000 | Freq: Three times a day (TID) | ORAL | Status: DC
  Administered 2018-05-17 – 2018-05-18 (×3): 1 via ORAL

## 2018-05-17 MED ORDER — ORAL CARE MOUTH RINSE
15.0000 mL | Freq: Two times a day (BID) | OROMUCOSAL | Status: DC
  Administered 2018-05-17: 15 mL via OROMUCOSAL

## 2018-05-17 MED ORDER — PREDNISONE 10 MG PO TABS
10.0000 mg | ORAL_TABLET | Freq: Every day | ORAL | Status: DC
  Administered 2018-05-18 – 2018-05-19 (×2): 10 mg via ORAL
  Filled 2018-05-17 (×2): qty 1

## 2018-05-17 NOTE — Care Management Note (Signed)
Case Management Note  Patient Details  Name: Kristina Summers MRN: [id number] Date of Birth: [birth year]  Subjective/Objective:                    Action/Plan:Pt will discharge home with daughters and [place].    Expected Discharge Date:                  Expected Discharge Plan:  Home w Hospice Care  In-House Referral:     Discharge planning Services  CM Consult  Post Acute Care Choice:    Choice offered to:     DME Arranged:    DME Agency:     HH Arranged:  RN [place] Agency:  [facility]  Status of Service:  Completed, signed off  If discussed at [place] of Stay Meetings, dates discussed:    Additional CommentsPurcell Mouton, RN 05/17/2018, 10:58 AM

## 2018-05-17 NOTE — Evaluation (Addendum)
Clinical/Bedside Swallow Evaluation Patient Details  Name: Neka Bise MRN: [id number] Date of Birth: 11/16/1935  Today's Date: 05/17/2018 Time: SLP Start Time (ACUTE ONLY): 1145 SLP Stop Time (ACUTE ONLY): 1216 SLP Time Calculation (min) (ACUTE ONLY): 31 min  Past Medical History:  Past Medical History:  Diagnosis Date  . Anemia   . CAD (coronary artery disease)    a. s/p CABG in 2000  . Carotid artery occlusion   . CKD (chronic kidney disease)   . Diastolic CHF ([place]) 87/5643  . Fibromyalgia   . GERD (gastroesophageal reflux disease)   . History of CVA (cerebrovascular accident) 05/24/2014  . History of GI bleed   . Hyperlipidemia   . Hypertension   . Obesity   . PAF (paroxysmal atrial fibrillation) (HCC)    a. on Eliquis  . Peripheral vascular disease ([place])   . 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 Type II diabetes mellitus ([place]) dx'd 1979   Past Surgical History:  Past Surgical History:  Procedure Laterality Date  . ABDOMINAL AORTAGRAM N/A 04/01/2011   Procedure: ABDOMINAL AORTAGRAM;  Surgeon: Conrad Brightwood, MD;  Location: [facility] CATH LAB;  Service: Cardiovascular;  Laterality: N/A;  . ANGIOPLASTY  06/17/11   Left leg common femoral artery cannulation under u/s Left leg runoff  . ARTERY BIOPSY Right 04/13/2018   Procedure: BIOPSY TEMPORAL ARTERY RIGHT;  Surgeon: Rosetta Posner, MD;  Location: [place];  Service: Vascular;  Laterality: Right;  . CARDIAC CATHETERIZATION  10/11/2017  . CARPAL TUNNEL RELEASE Right   . CATARACT EXTRACTION W/ INTRAOCULAR LENS  IMPLANT, BILATERAL  2004-2005  . CORONARY ARTERY BYPASS GRAFT  2000   CABG X5  . ESOPHAGOGASTRODUODENOSCOPY (EGD) WITH PROPOFOL N/A 12/25/2017   Procedure: ESOPHAGOGASTRODUODENOSCOPY (EGD) WITH PROPOFOL;  Surgeon: Wonda Horner, MD;  Location: [facility] ENDOSCOPY;  Service: Endoscopy;  Laterality: N/A;  . EYE  SURGERY Left    "lasered before cataract OR"  . GIVENS CAPSULE STUDY  03/14/2018  . GIVENS CAPSULE STUDY N/A 03/14/2018   Procedure: GIVENS CAPSULE STUDY;  Surgeon: Ronald Lobo, MD;  Location: [place];  Service: Endoscopy;  Laterality: N/A;  . LOWER EXTREMITY ANGIOGRAM Bilateral 04/01/2011   Procedure: LOWER EXTREMITY ANGIOGRAM;  Surgeon: Conrad [place], MD;  Location: [facility] CATH LAB;  Service: Cardiovascular;  Laterality: Bilateral;  . LOWER EXTREMITY ANGIOGRAM Left 06/17/2011   Procedure: LOWER EXTREMITY ANGIOGRAM;  Surgeon: Conrad Wilkin, MD;  Location: [facility] CATH LAB;  Service: Cardiovascular;  Laterality: Left;  . LOWER EXTREMITY ANGIOGRAM N/A 11/18/2011   Procedure: LOWER EXTREMITY ANGIOGRAM;  Surgeon: Conrad Buckland, MD;  Location: [facility] CATH LAB;  Service: Cardiovascular;  Laterality: N/A;  . PERCUTANEOUS STENT INTERVENTION Right 04/01/2011   Procedure: PERCUTANEOUS STENT INTERVENTION;  Surgeon: Conrad , MD;  Location: [facility] C[facility] LAB;  Service: Cardiovascular;  Laterality: Right;  rt iliac stent  . PERIPHERAL ARTERIAL STENT GRAFT  04/01/11   right common iliac  . RIGHT/LEFT HEART CATH AND CORONARY/GRAFT ANGIOGRAPHY N/A 10/11/2017   Procedure: RIGHT/LEFT HEART CATH AND CORONARY/GRAFT ANGIOGRAPHY;  Surgeon: Sherren Mocha, MD;  Location: [place] CV LAB;  Service: Cardiovascular;  Laterality: N/A;  . TEE WITHOUT CARDIOVERSION N/A 11/08/2017   Procedure: TRANSESOPHAGEAL ECHOCARDIOGRAM (TEE);  Surgeon: Sherren Mocha, MD;  Location: [place];  Service: Open Heart Surgery;  Laterality: N/A;  . TEE WITHOUT CARDIOVERSION N/A 12/15/2017   Procedure: TRANSESOPHAGEAL ECHOCARDIOGRAM (  TEE);  Surgeon: Larey Dresser, MD;  Location: [facility] ENDOSCOPY;  Service: Cardiovascular;  Laterality: N/A;  . TRANSCATHETER AORTIC VALVE REPLACEMENT, TRANSFEMORAL N/A 11/08/2017   Procedure: TRANSCATHETER AORTIC VALVE REPLACEMENT, TRANSFEMORAL;  Surgeon: Sherren Mocha, MD;  Location: [place];  Service: Open Heart Surgery;   Laterality: N/A;  . TRIGGER FINGER RELEASE Left 1996   thumb   HPI:  Pt is a 83 y.o. female being evaluated for concerns of aspiration pnemonia upon admission to [facility]. She is currently being evaluated for determining the most comfortable diet given current swallowing abilities after her most recent stroke.    Assessment / Plan / Recommendation Clinical Impression  Pt was seen in her room for bedside swallowing evaluation.  She was unable to provide history due to aphasia.  Daughters were present initially but left after discussing care with the clinician.  Patient exhibited reduced lingual coordination c/b inability to perform isolated movements (e.g., stick out your tongue). Upon visual inspection, oral structures appeared intact.  Patient presented with xerostomia with suspected oral thrush on her tongue and R buccal cavity. Patient showed difficulty manipulating solid consistencies however her dentures were not present and anticipate will function better with dentition.  Daughter advised he will bring them for her.  She adequately managed thin and puree consistencies.  She coughed 2x after swallowing many trials of thin liquids but was unable to elicit a volitional cough due to motor planning from her prior CVA. She did not appear uncomfortable or in pain.  Daughters agree plan is for comfort diet for pt and she appeared to enjoy consistencies provided to her without cough/discomfort.  Education with pt completed reviewing compensatory strategies and provided swallow precaution signs.     SLP Visit Diagnosis: Dysphagia, oropharyngeal phase (R13.12)    Aspiration Risk  Mild aspiration risk    Diet Recommendation Dysphagia 3 (Mech soft);Thin liquid   Liquid Administration via: No straw Medication Administration: Whole meds with puree Supervision: Staff to assist with self feeding Compensations: Minimize environmental distractions;Slow rate;Small sips/bites;Lingual sweep for clearance of  pocketing;Follow solids with liquid Postural Changes: Seated upright at 90 degrees;Remain upright for at least 30 minutes after po intake    Other  Recommendations Oral Care Recommendations: Oral care BID   Follow up Recommendations   NA     Frequency and Duration   1 visit         Prognosis Prognosis for Safe Diet Advancement: Fair Barriers to Reach Goals: Severity of deficits      Swallow Study   General Date of Onset: 05/15/18 HPI: Pt is a 83 y.o. female being evaluated for concerns of aspiration pnemonia upon admission to [facility]. She is currently being evaluated for determining the most comfortable diet given current swallowing abilities after her most recent stroke.  Type of Study: Bedside Swallow Evaluation Diet Prior to this Study: NPO( ) Temperature Spikes Noted: No Respiratory Status: Nasal cannula History of Recent Intubation: No Behavior/Cognition: Alert;Cooperative;Pleasant mood Oral Cavity Assessment: Dry(Concerns for oral thrush on R side tongue and cheek) Oral Cavity - Dentition: Dentures, not available Vision: Impaired for self-feeding Self-Feeding Abilities: Needs assist Patient Positioning: Upright in bed;Other (comment)(R arm in sling) Baseline Vocal Quality: Not observed Volitional Cough: Cognitively unable to elicit Volitional Swallow: Unable to elicit    Oral/Motor/Sensory Function Overall Oral Motor/Sensory Function: Other (comment)(Muscle apraxia) Facial ROM: Suspected CN VII (facial) dysfunction Facial Symmetry: Within Functional Limits Facial Sensation: Reduced right Lingual ROM: Suspected CN XII (hypoglossal) dysfunction Lingual Symmetry: Suspected CN  XII (hypoglossal) dysfunction Lingual Strength: Reduced(adequate for manipulation of cracker but delayed) Lingual Sensation: Reduced Velum: Within Functional Limits   Ice Chips Ice chips: Within functional limits Presentation: Spoon   Thin Liquid Thin Liquid: Within functional limits Presentation:  Straw;Spoon Other Comments: Patient coughed softly 2x after swallows of water. Did not appear uncomfortable and reported no pain.  When asked, did not feel like anything was still in her throat.     Nectar Thick   NT  Honey Thick   NT  Puree Puree: Within functional limits Presentation: Spoon   Solid     Solid: Within functional limits Presentation: Self Fed Other Comments: Self- fed cracker with and without applesauce.  Residue in R buccal cavity.  Cleared most of residue with applesauce and cleared small amount with SLP providing finger sweep of cheek.        Kaitlynn Plaskett 05/17/2018,1:01 PM Jerlyn Ly, B.A.  Graduate Student Clinician   Cosigned by Luanna Salk, [place] [facility] SLP Acute Rehab Services Pager [phone] Office [phone]

## 2018-05-17 NOTE — Progress Notes (Signed)
[place] for heparin Indication:hx atrial fibrillation (home Eliquis on hold)  Allergies  Allergen Reactions  . Morphine And Related Nausea And Vomiting and Other (See Comments)    Chest pain, also   . Lisinopril Cough  . Zoloft [Sertraline Hcl] Nausea Only         Patient Measurements: Height: 5\' 6"  (167.6 cm) Weight: 197 lb 1.5 oz (89.4 kg) IBW/kg (Calculated) : 59.3 Heparin Dosing Weight: 78 kg  Vital Signs: Temp: 97.5 F (36.4 C) (01/29 0538) Temp Source: Oral (01/29 0538) BP: 111/95 (01/29 0538) Pulse Rate: 73 (01/29 0538)  Labs: Recent Labs    05/15/18 1450 05/16/18 0508 05/16/18 1340 05/16/18 2311  HGB 8.3* 8.3*  --   --   HCT 27.8* 28.7*  --   --   PLT 120* 132*  --   --   APTT  --   --  29 65*  LABPROT  --  17.8*  --   --   INR  --  1.49  --   --   HEPARINUNFRC  --   --  >2.20* 1.72*  CREATININE 2.04* 2.32*  --   --     Estimated Creatinine Clearance: 21.8 mL/min (A) (by C-G formula based on SCr of 2.32 mg/dL (H)).   Medications:  - on Eliquis 2.5 mg BID PTA (last dose taken on 1/27 at 0935)  Assessment: Patient's an 83 y.o F with hx afib and recent CVA (1/20) on Eliquis PTA, presented to the ED from nursing facility on 05/15/18 with AMS.  Head CTA on 1/27 showed "Progressive medial anterior left frontal lobe infarct, likely within the ACA territory." Eliquis changed to heparin drip since pt's NPO.  Today, 05/17/2018: - aPTT is slightly supra-therapeutic at 89; Heparin level is elevated at 1.78 but this is likely from residual effect of Eliquis. Will adjust heparin drip based on aPTT for now. Once both levels correlate, then will adjust using heparin level. - hgb down 7.7, plts stable - no active bleeding documented - scr up 2.40, on sodium bicarb drip  Goal of Therapy:  Heparin level 0.3-0.5 units/ml aPTT 66-85 seconds (d/t recent CVA) Monitor platelets by anticoagulation protocol: Yes   Plan:  - with  scr trending up, will decrease heparin drip back to 950 units/hr to account for possible drug accumulation d/t renal insuff. - check 8 hr aPTT - monitor for s/s bleeding  Jaquille Kau P 05/17/2018,8:25 AM

## 2018-05-17 NOTE — Progress Notes (Signed)
ANTICOAGULATION CONSULT NOTE - Follow Up Consult  Pharmacy Consult for Heparin Indication: atrial fibrillation  Apixaban held  Allergies  Allergen Reactions  . Morphine And Related Nausea And Vomiting and Other (See Comments)    Chest pain, also   . Lisinopril Cough  . Zoloft [Sertraline Hcl] Nausea Only         Patient Measurements: Height: 5\' 6"  (167.6 cm) Weight: 193 lb 9 oz (87.8 kg) IBW/kg (Calculated) : 59.3 Heparin Dosing Weight:   Vital Signs: Temp: 97.6 F (36.4 C) (01/28 2107) Temp Source: Oral (01/28 2107) BP: 133/51 (01/28 2107) Pulse Rate: 66 (01/28 2107)  Labs: Recent Labs    05/15/18 1450 05/16/18 0508 05/16/18 1340 05/16/18 2311  HGB 8.3* 8.3*  --   --   HCT 27.8* 28.7*  --   --   PLT 120* 132*  --   --   APTT  --   --  29 65*  LABPROT  --  17.8*  --   --   INR  --  1.49  --   --   HEPARINUNFRC  --   --  >2.20* 1.72*  CREATININE 2.04* 2.32*  --   --     Estimated Creatinine Clearance: 21.6 mL/min (A) (by C-G formula based on SCr of 2.32 mg/dL (H)).   Medications:  Infusions:  . sodium chloride    . heparin 950 Units/hr (05/16/18 1517)  . piperacillin-tazobactam (ZOSYN)  IV 3.375 g (05/16/18 2344)  .  sodium bicarbonate  infusion 1000 mL 75 mL/hr at 05/16/18 2344  . vancomycin      Assessment: Patient with high heparin level but low PTT.  PTT ordered with Heparin level until both correlate due to possible drug-lab interaction between oral anticoagulant (rivaroxaban, edoxaban, or apixaban) and anti-Xa level (aka heparin level).  No heparin issues per RN.  Goal of Therapy:  Heparin level 0.3-0.5 units/ml (d/t recent CVA) aPTT 66-80 seconds (d/t recent CVA) Monitor platelets by anticoagulation protocol: Yes   Plan:  Increase heparin to 1000 units/hr Recheck level at [place], [place] 05/17/2018,1:07 AM

## 2018-05-17 NOTE — Consult Note (Signed)
Consultation Note Date: 05/17/2018   Patient Name: Kristina Summers  DOB: [birth year]  MRN: [id number]  Age / Sex: 83 y.o., female  PCP: Patient, No Pcp Per Referring Physician: Georgette Shell, MD  Reason for Consultation: Establishing goals of care  HPI/Patient Profile: 83 y.o. female  with past medical history of diabetes, A. fib on Eliquis, Tavr, hypertension, chronic diastolic heart failure, CAD, CVA with recent admission from 1/20-1/23 for acute CVA with resultant pseudobulbar speech and right hemiparesis who was discharged to skilled facility for rehab admitted on 05/15/2018 with concern for recurrent CVA and aspiration pneumonia.  Currently being treated for reversible conditions including IV antibiotics.  Palliative consulted for goals of care.  Clinical Assessment and Goals of Care: I called and was able to reach patient's daughter, Kristina Summers.  She is a Marine scientist at [employer].  We discussed clinical course as well as wishes moving forward in regard to  care this hospitalization.  Concepts specific to code status and rehospitalization discussed.  We discussed difference between a aggressive medical intervention path and a palliative, comfort focused care path.  Values and goals of care important to patient and family were attempted to be elicited.  [name] reports that family has met with liaison from hospice and palliative care [place] and eventual goal is to transition her mother home with hospice support.  She reports that she and her siblings were hopeful to have treatment for potentially treatable conditions this hospitalization and then transition home with the support of hospice.  Her daughter reports understanding her overall condition and the fact that she is approaching end-of-life. But with further discussion, she reports that she and her siblings have not discussed some particulars about her  situation.  We then discussed some of the concerns that are going to be coming up over the next day or 2.  This includes what to do if she is unable to eat (such as recommendation for comfort feeds), how to address her anticoagulation if she is unable to take pills (currently on heparin infusion), and what will be plan of care if her mental status does not continue to improve on antibiotics.  Questions and concerns addressed.   PMT will continue to support holistically.  SUMMARY OF RECOMMENDATIONS   -Overall goal remains to treat treatable conditions and work to transition home with the support of hospice -We began discussion about potential concerns based upon her clinical course of the next couple of days.  These included potential for initiation of comfort feeds if she is not able to safely swallow, plan for anticoagulation if she is not able to take pills, and plan moving forward if her mental status does not improve with antibiotics for treatment of underlying pneumonia.  Her daughter reports that this is something she will need to discuss further with her siblings.  She does state that while plan is to continue with antibiotics, she does not think that her siblings would want to continue to work to reestablish IV access for continuation of heparin if  access is lost.  She requested call from nursing to discuss this at the time it occurs and she will discuss with her siblings to determine their wishes moving forward with anticoagulation if she is unable to take p.o. -Dropped off a copy of hard choices for loving people for family to review.  I specifically asked them to look at the section regarding artificial nutrition and hydration at end of life with recommendation that consideration should be made for comfort feeds if she is not able to safely swallow.  Code Status/Advance Care Planning:  DNR Psycho-social/Spiritual:   Desire for further Chaplaincy support: Not addressed today  Additional  Recommendations: Education on Hospice  Prognosis:   < 6 months  Discharge Planning: Home with Hospice once medically maximized     Primary Diagnoses: Present on Admission: . Acute encephalopathy . CAD (coronary artery disease) . Diabetes mellitus type 2 with peripheral artery disease ([place]) . HCAP (healthcare-associated pneumonia) . Atrial fibrillation, chronic . Essential hypertension . Type II diabetes mellitus with renal manifestations ([place]) . CKD (chronic kidney disease) stage 4, GFR 15-29 ml/min (H[facility]) . Hyperlipidemia . Acute on chronic systolic CHF (congestive heart failure) ([place]) . CVA (cerebral vascular accident) ([place])   I have reviewed the medical record, interviewed the patient and family, and examined the patient. The following aspects are pertinent.  Past Medical History:  Diagnosis Date  . Anemia   . CAD (coronary artery disease)    a. s/p CABG in 2000  . Carotid artery occlusion   . CKD (chronic kidney disease)   . Diastolic CHF ([place]) 02/9416  . Fibromyalgia   . GERD (gastroesophageal reflux disease)   . History of CVA (cerebrovascular accident) 05/24/2014  . History of GI bleed   . Hyperlipidemia   . Hypertension   . Obesity   . PAF (paroxysmal atrial fibrillation) (HCC)    a. on Eliquis  . Peripheral vascular disease ([place])   . 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 Type II diabetes mellitus ([place]) dx'd 1979   Social History   Socioeconomic History  . Marital status: Divorced    Spouse name: Not on file  . Number of children: 3  . Years of education: 39  . Highest education level: Not on file  Occupational History  . Occupation: Retired  Scientific laboratory technician  . Financial resource strain: Not hard at all  . Food insecurity:    Worry: Never true    Inability: Never true  . Transportation needs:    Medical: No     Non-medical: No  Tobacco Use  . Smoking status: Former Smoker    Packs/day: 2.00    Years: 30.00    Pack years: 60.00    Types: Cigarettes    Last attempt to quit: 04/19/1990    Years since quitting: 28.0  . Smokeless tobacco: Never Used  . Tobacco comment: stopped smoking cigarettes 1991  Substance and Sexual Activity  . Alcohol use: Not Currently    Alcohol/week: 0.0 standard drinks    Comment: "tried different alcohols when I was 1st married; never drank much at  ALL"  . Drug use: Never  . Sexual activity: Not Currently  Lifestyle  . Physical activity:    Days per week: 0 days    Minutes per session: 0 min  . Stress: Only a little  Relationships  . Social connections:  Talks on phone: Three times a week    Gets together: Once a week    Attends religious service: 1 to 4 times per year    Active member of club or organization: No    Attends meetings of clubs or organizations: Never    Relationship status: Divorced  Other Topics Concern  . Not on file  Social History Narrative   Divorced.  Native of [place].  Formerly worked as Education administrator person. 01/13/17 lives alone   Caffeine use: drinks decaf tea and coffee   Family History  Problem Relation Age of Onset  . Other Brother        intestinal blockage  . Diabetes Brother    Scheduled Meds: . Chlorhexidine Gluconate Cloth  6 each Topical Q0600  . collagenase  1 application Topical Daily  . insulin aspart  0-9 Units Subcutaneous Q6H  . levalbuterol  0.63 mg Nebulization TID  . methylPREDNISolone (SOLU-MEDROL) injection  40 mg Intravenous Daily  . metoprolol tartrate  5 mg Intravenous Q6H  . mupirocin ointment  1 application Nasal BID  . pantoprazole (PROTONIX) IV  40 mg Intravenous Q24H  . sodium chloride flush  3 mL Intravenous Q12H  . sodium chloride flush  3 mL Intravenous Q12H   Continuous Infusions: . sodium chloride    . heparin 1,000 Units/hr (05/17/18 0113)  . piperacillin-tazobactam (ZOSYN)  IV 3.375 g  (05/17/18 0844)  .  sodium bicarbonate  infusion 1000 mL 75 mL/hr at 05/16/18 2344  . vancomycin     PRN Meds:.sodium chloride, acetaminophen **OR** acetaminophen, bisacodyl, HYDROmorphone (DILAUDID) injection, levalbuterol, ondansetron **OR** ondansetron (ZOFRAN) IV, polyethylene glycol, sodium chloride flush, sodium phosphate Medications Prior to Admission:  Prior to Admission medications   Medication Sig Start Date End Date Taking? Authorizing Provider  acetaminophen (TYLENOL) 325 MG tablet Take 2 tablets (650 mg total) by mouth every 6 (six) hours as needed for mild pain, moderate pain or headache. 05/11/18  Yes Hongalgi, Lenis Dickinson, MD  Amino Acids-Protein Hydrolys (FEEDING SUPPLEMENT, PRO-STAT SUGAR FREE 64,) LIQD Take 30 mLs by mouth 2 (two) times daily. 04/21/18  Yes Hosie Poisson, MD  amLODipine (NORVASC) 10 MG tablet Take 10 mg by mouth daily.    Yes [provider]  apixaban (ELIQUIS) 2.5 MG TABS tablet Take 1 tablet (2.5 mg total) by mouth 2 (two) times daily. 05/11/18  Yes Hongalgi, Lenis Dickinson, MD  atorvastatin (LIPITOR) 80 MG tablet Take 1 tablet (80 mg total) by mouth daily at 6 PM. 05/11/18  Yes Hongalgi, Lenis Dickinson, MD  collagenase (SANTYL) ointment Apply 1 application topically daily. Apply to right foot, lateral 5th digit full thickness wound in a 1/8 inch layer.  Top with a saline moistened gauze 2x2, cover with dry dressing and secure with medipore tape. Place foot into Prevalon boot to correct lateral rotation. 05/11/18  Yes Hongalgi, Lenis Dickinson, MD  ferrous sulfate 325 (65 FE) MG tablet Take 325 mg by mouth daily with breakfast.   Yes [provider]  furosemide (LASIX) 40 MG tablet Take 1 tablet (40 mg total) by mouth daily. 05/11/18  Yes Hongalgi, Lenis Dickinson, MD  insulin aspart (NOVOLOG) 100 UNIT/ML injection Inject 0-15 Units into the skin 3 (three) times daily with meals. CBG < 70: implement hypoglycemia protocol CBG 70 - 120: 0 units CBG 121 - 150: 2 units CBG 151 - 200: 3  units CBG 201 - 250: 5 units CBG 251 - 300: 8 units CBG 301 - 350: 11 units CBG  351 - 400: 15 units CBG > 400: call MD. Patient taking differently: Inject 0-15 Units into the skin 3 (three) times daily with meals. CBG < 70: implement hypoglycemia protocol CBG 70 - 200: 0 units      201-250: 2 units      251-300: 4 units      301-350: 6 units      351-400: 8 units      401-450: 10 units      451-500: 12 units      501-800: 14 units    Over 800: Call NP/PA 05/11/18  Yes Hongalgi, Lenis Dickinson, MD  insulin NPH Human (HUMULIN N,NOVOLIN N) 100 UNIT/ML injection Inject 0.1 mLs (10 Units total) into the skin at bedtime. 05/11/18  Yes Hongalgi, Lenis Dickinson, MD  insulin NPH Human (HUMULIN N,NOVOLIN N) 100 UNIT/ML injection Inject 0.2 mLs (20 Units total) into the skin daily before breakfast. 05/11/18  Yes Hongalgi, Lenis Dickinson, MD  linagliptin (TRADJENTA) 5 MG TABS tablet Take 5 mg by mouth daily.   Yes [provider]  metoprolol tartrate (LOPRESSOR) 25 MG tablet Take 1 tablet (25 mg total) by mouth 2 (two) times daily. 03/18/18 05/15/18 Yes Arrien, Jimmy Picket, MD  Multiple Vitamin (MULTIVITAMIN WITH MINERALS) TABS tablet Take 1 tablet by mouth daily. One a Day 50 plus   Yes [provider]  nitroGLYCERIN (NITROSTAT) 0.4 MG SL tablet Place 0.4 mg under the tongue every 5 (five) minutes as needed for chest pain.   Yes [provider]  pantoprazole (PROTONIX) 40 MG tablet Take 1 tablet (40 mg total) by mouth daily for 30 doses. 03/18/18 05/15/18 Yes Arrien, Jimmy Picket, MD  predniSONE (DELTASONE) 20 MG tablet Take 2 tablets (40 mg total) by mouth daily with breakfast. Taper down by 10 mg every week. 05/12/18  Yes Hongalgi, Lenis Dickinson, MD  senna (SENOKOT) 8.6 MG TABS tablet Take 1 tablet by mouth as needed for mild constipation.   Yes [provider]   Allergies  Allergen Reactions  . Morphine And Related Nausea And Vomiting and Other (See Comments)    Chest pain, also   .  Lisinopril Cough  . Zoloft [Sertraline Hcl] Nausea Only        Review of Systems  Unable to obtain  Physical Exam  General: Alert, awake, in no acute distress.  Not participate in conversation or meaningfully interact with examiner. HEENT: No bruits, no goiter, no JVD Heart: Regular rate and rhythm. No murmur appreciated. Lungs: Good air movement, coarse throughout Abdomen: Soft, nontender, nondistended, positive bowel sounds.  Ext: RUE in sling Skin: Warm and dry  Vital Signs: BP (!) 111/95 (BP Location: Left Leg)   Pulse 73   Temp (!) 97.5 F (36.4 C) (Oral)   Resp 14   Ht '5\' 6"'$  (1.676 m)   Wt 89.4 kg   SpO2 93%   BMI 31.81 kg/m  Pain Scale: PAINAD       SpO2: SpO2: 93 % O2 Device:SpO2: 93 % O2 Flow Rate: .O2 Flow Rate (L/min): 2 L/min  IO: Intake/output summary:   Intake/Output Summary (Last 24 hours) at 05/17/2018 0958 Last data filed at 05/16/2018 1538 Gross per 24 hour  Intake 680.96 ml  Output -  Net 680.96 ml    LBM: Last BM Date: (unknown) Baseline Weight: Weight: 87.8 kg Most recent weight: Weight: 89.4 kg     Palliative Assessment/Data:   Flowsheet Rows     Most Recent Value  Intake Tab  Referral Department  Hospitalist  Unit at Time of Referral  ER  Date Notified  05/16/18  Palliative Care Type  New Palliative care  Reason for referral  Clarify Goals of Care  Date of Admission  05/15/18  # of days IP prior to Palliative referral  1  Clinical Assessment  Psychosocial & Spiritual Assessment  Palliative Care Outcomes     Time Total: 60 Greater than 50%  of this time was spent counseling and coordinating care related to the above assessment and plan.  Signed by: Micheline Rough, MD   Please contact Palliative Medicine Team phone at [phone] for questions and concerns.  For individual provider: See Shea Evans

## 2018-05-17 NOTE — Progress Notes (Signed)
PROGRESS NOTE    Grayce Budden  FOY:[id number] DOB: [birth year] DOA: 05/15/2018   Brief Narrative: 83 year old female history of diabetes, A. fib on Eliquis, status post TAVR 11/08/2017, hypertension, chronic diastolic heart failure, coronary artery disease status post CABG recently hospitalized from 05/08/2018 to 05/11/2018 for acute CVA with resultant pseudobulbar speech and right hemiparesis and discharged on apixaban.  Patient presented back with altered mental status.  Concern for progressive recurrent CVA and aspiration pneumonia.  Patient admitted placed on IV antibiotics and goal was to treat the reversible things.  Palliative care consultation pending for goals of care.  05/17/2018 met with patient patient's 2 daughters at bedside.  Per family patient is much more awake and alert compared to 2 days ago.  She is able see note she is able to smile however I have not heard her talk when the daughter asked her if she wants to go home she nodded her head yes with a smile.  The plan is for her to go home with hospice when she is ready for discharge. Assessment & Plan:   Principal Problem:   Acute encephalopathy Active Problems:   CAD (coronary artery disease)   Atrial fibrillation, chronic   Diabetes mellitus type 2 with peripheral artery disease (HCC)   Hyperlipidemia   Acute on chronic congestive heart failure (HCC)   Essential hypertension   Type II diabetes mellitus with renal manifestations (HCC)   Severe aortic stenosis   S/P TAVR (transcatheter aortic valve replacement)   CKD (chronic kidney disease) stage 4, GFR 15-29 ml/min (HCC)   Acute on chronic systolic CHF (congestive heart failure) (HCC)   CVA (cerebral vascular accident) ([place])   Right sided weakness   HCAP (healthcare-associated pneumonia)   Aspiration pneumonia ([place])   Acute renal failure superimposed on stage 4 chronic kidney disease ([place])  1 acute encephalopathy-multifactorial secondary to pneumonia versus recurrent  stroke.  Patient has made significant improvement since 2 days ago.  Today she is much awake and able to answer yes and no by nodding her head.  Per discharge summary from 05/11/2018 neurology and stroke team follow the patient during the hospitalization and recommended no further stroke work-up and to continue anticoagulation with Eliquis  patient is being by speech therapy and recommends dysphagia 3 diet.  2. Probable healthcare pneumonia versus aspiration pneumonia-continue vancomycin and Zosyn repeat chest x-ray.  MRSA PCR positive.  Blood cultures pending.  3. ?? Acute on chronic systolic heart failure Chest x-ray concerning for increasing vascular congestion. Patient presented with increased O2 requirements and shortness of breath.  Patient given a dose of IV Lasix however due to worsening renal function will discontinue IV Lasix.  Continue empiric IV antibiotics.  Strict I's and O's.  Daily weights.  4. Acute on chronic renal failure stage IV creatinine trending up in spite of slow IV hydration.  Patient received Lasix in the ER.  5. Probable progressive CVA likely embolic -noted on CT head. Per last discharge summary from 05/11/2018 neurology assess patient during that hospitalization recommended no further stroke work-up and patient to follow-up with neurology in the outpatient setting. Was recommended to continue Eliquis. Per ED physician family wanted to only treat reversible things at this time and not interested in any further invasive work-up. Family noted to have contacted hospice of [facility] who came to evaluate patient in the ED  Patient currently n.p.o. and not swallowing per RN.  Discontinue Eliquis and placed on IV heparin for secondary stroke prophylaxis for now pending palliative  care consultation for goals of care.   6. Chronic A. fib status post TAVR Patient currently n.p.o.  Continue IV Lopressor for rate control.  Per RN patient not swallowing.  Discontinue  apixaban and placed on IV heparin for anticoagulation pending palliative care evaluation.    7. Hypertension Continue IV Lopressor. Follow.  8. Type II Hemoglobin A1c was 7.2 during last hospitalization.  Patient currently n.p.o.  CBG of 219 this morning.  Place on Lantus 5 units daily.  Continue sliding scale insulin.  9. Normocytic anemia No overt bleeding. Hemoglobin currently stable at 8.3. Follow H&H.  10. Gastroesophageal reflux disease Continue PPI.  11. Temporal headache Patient noted to have a history of temporal arteritis diagnosis and was initiated on prednisone.  She was supposed to be continued on prednisone 10 mg daily which will be started today DC Solu-Medrol.   12.  Right upper extremity swelling Prior to admission was noted that patient may have had a IV infiltration in the right upper extremity.  Patient on full dose anticoagulation.  Place right upper extremity admission sling.  Elevate.  13 thrush we will give Diflucan  Estimated body mass index is 31.81 kg/m as calculated from the following:   Height as of this encounter: '5\' 6"'$  (1.676 m).   Weight as of this encounter: 89.4 kg.  DVT prophylaxis: Heparin Code Status: DO NOT RESUSCITATE Family Communication: Discussed with 2 daughters Disposition Plan: Pending clinical progress Consultants: Palliative care   Procedures none Antimicrobials: Vanco and Zosyn  Subjective: Resting in bed awake eyes open node yes and no to questions being asked  Objective: Vitals:   05/16/18 2107 05/17/18 0538 05/17/18 0730 05/17/18 1236  BP: (!) 133/51 (!) 111/95  (!) 142/54  Pulse: 66 73  75  Resp: 16 14    Temp: 97.6 F (36.4 C) (!) 97.5 F (36.4 C)    TempSrc: Oral Oral    SpO2: 98% 95% 93% 95%  Weight:  89.4 kg    Height:        Intake/Output Summary (Last 24 hours) at 05/17/2018 1329 Last data filed at 05/16/2018 1538 Gross per 24 hour  Intake 680.96 ml  Output -  Net 680.96 ml   Filed  Weights   05/15/18 1900 05/17/18 0538  Weight: 87.8 kg 89.4 kg    Examination:  General exam: Appears calm and comfortable  Respiratory system: Few scattered rhonchi to auscultation. Respiratory effort normal. Cardiovascular system: S1 & S2 heard, RRR. No JVD, murmurs, rubs, gallops or clicks. No pedal edema. Gastrointestinal system: Abdomen is nondistended, soft and nontender. No organomegaly or masses felt. Normal bowel sounds heard. Central nervous system awake aphasic Extremities: 1+ pitting edema. Skin: No rashes, lesions or ulcers    Data Reviewed: I have personally reviewed following labs and imaging studies  CBC: Recent Labs  Lab 05/10/18 1926 05/11/18 0511 05/15/18 1450 05/16/18 0508 05/17/18 0914  WBC 12.0* 14.5* 12.4* 10.5 16.1*  NEUTROABS  --   --  12.0*  --  15.0*  HGB 8.2* 8.3* 8.3* 8.3* 7.7*  HCT 27.3* 27.9* 27.8* 28.7* 26.7*  MCV 88.9 89.7 93.0 93.2 91.8  PLT 152 141* 120* 132* 409*   Basic Metabolic Panel: Recent Labs  Lab 05/11/18 0511 05/15/18 1450 05/15/18 1928 05/16/18 0508 05/17/18 0919  NA 139 134*  --  134* 137  K 3.9 4.6  --  4.8 4.3  CL 109 105  --  104 102  CO2 22 19*  --  15* 22  GLUCOSE 315* 217*  --  252* 241*  BUN 65* 98*  --  104* 110*  CREATININE 1.85* 2.04*  --  2.32* 2.40*  CALCIUM 8.1* 7.7*  --  7.8* 7.6*  MG  --   --  2.6*  --   --    GFR: Estimated Creatinine Clearance: 21 mL/min (A) (by C-G formula based on SCr of 2.4 mg/dL (H)). Liver Function Tests: Recent Labs  Lab 05/15/18 1450  AST 43*  ALT 38  ALKPHOS 142*  BILITOT 2.9*  PROT 6.0*  ALBUMIN 2.3*   No results for input(s): LIPASE, AMYLASE in the last 168 hours. No results for input(s): AMMONIA in the last 168 hours. Coagulation Profile: Recent Labs  Lab 05/16/18 0508  INR 1.49   Cardiac Enzymes: No results for input(s): CKTOTAL, CKMB, CKMBINDEX, TROPONINI in the last 168 hours. BNP (last 3 results) No results for input(s): PROBNP in the last 8760  hours. HbA1C: No results for input(s): HGBA1C in the last 72 hours. CBG: Recent Labs  Lab 05/16/18 1741 05/16/18 2109 05/17/18 0010 05/17/18 0603 05/17/18 1215  GLUCAP 251* 208* 211* 241* 260*   Lipid Profile: No results for input(s): CHOL, HDL, LDLCALC, TRIG, CHOLHDL, LDLDIRECT in the last 72 hours. Thyroid Function Tests: No results for input(s): TSH, T4TOTAL, FREET4, T3FREE, THYROIDAB in the last 72 hours. Anemia Panel: No results for input(s): VITAMINB12, FOLATE, FERRITIN, TIBC, IRON, RETICCTPCT in the last 72 hours. Sepsis Labs: Recent Labs  Lab 05/15/18 1455 05/15/18 1529 05/15/18 1928 05/16/18 0508 05/17/18 0914  PROCALCITON  --  0.31  --  0.32 0.24  LATICACIDVEN 1.2  --  1.9  --   --     Recent Results (from the past 240 hour(s))  Blood culture (routine x 2)     Status: None (Preliminary result)   Collection Time: 05/15/18  2:50 PM  Result Value Ref Range Status   Specimen Description   Final    BLOOD LEFT WRIST Performed at [facility] [address] [place], [place] [address]    Special Requests   Final    BOTTLES DRAWN AEROBIC AND ANAEROBIC Blood Culture adequate volume Performed at [facility] [address], [place], [place] [address]    Culture   Final    NO GROWTH 2 DAYS Performed at [facility] [address] [place], [place] [address]    Report Status PENDING  Incomplete  Blood culture (routine x 2)     Status: None (Preliminary result)   Collection Time: 05/15/18  2:50 PM  Result Value Ref Range Status   Specimen Description   Final    BLOOD LEFT ANTECUBITAL Performed at [facility] [address] [place], [place] [address]    Special Requests   Final    BOTTLES DRAWN AEROBIC AND ANAEROBIC Blood Culture results may not be optimal due to an excessive volume of blood received in culture bottles Performed at [facility] [address]  [place], [place] [address]    Culture   Final    NO GROWTH 2 DAYS Performed at [facility] [address], [place], [place] [address]    Report Status PENDING  Incomplete  Culture, Urine     Status: Abnormal (Preliminary result)   Collection Time: 05/15/18  5:30 PM  Result Value Ref Range Status   Specimen Description   Final    URINE, CLEAN CATCH Performed at [facility], [place] [address] [place], [place] [address]    Special Requests   Final    NONE Performed at [facility], [place] [address], [place], [place] [address]    Culture >=100,000 COLONIES/mL ENTEROCOCCUS FAECALIS (A)  Final   Report Status PENDING  Incomplete  MRSA PCR Screening     Status: Abnormal   Collection Time: 05/16/18  8:57 AM  Result Value Ref Range Status   MRSA by PCR POSITIVE (A) NEGATIVE Final    Comment:        The GeneXpert MRSA Assay (FDA approved for NASAL specimens only), is one component of a comprehensive MRSA colonization surveillance program. It is not intended to diagnose MRSA infection nor to guide or monitor treatment for MRSA infections. RESULT CALLED TO, READ BACK BY AND VERIFIED WITH: Richelle Ito [id number] @ [address] [place] [address] Performed at [facility] [address], [place], [place] [address]          Radiology Studies: Dg Chest 1 View  Result Date: 05/15/2018 CLINICAL DATA:  Shortness of Breath EXAM: CHEST  1 VIEW COMPARISON:  04/10/2018 FINDINGS: Cardiac shadow is mildly enlarged. Changes of coronary bypass grafting and prior TAVR are noted. Increased central vascular congestion is noted with patchy infiltrate most prominent within the midportion of the right lung. Small right-sided pleural effusion is noted. No acute bony abnormality is seen. Chronic changes in the proximal right humerus are noted. IMPRESSION: Vascular congestion increased from the prior exam. Patchy infiltrative opacities are noted bilaterally  worst in the midportion of the right lung. Electronically Signed   By: Inez Catalina M.D.   On: 05/15/2018 15:51   Ct Head Wo Contrast  Result Date: 05/15/2018 CLINICAL DATA:  Acute altered level of consciousness. Stroke 1 week ago. The EXAM: CT HEAD WITHOUT CONTRAST TECHNIQUE: Contiguous axial images were obtained from the base of the skull through the vertex without intravenous contrast. COMPARISON:  CT head without contrast 05/08/2018. MRI brain without contrast 05/08/2018 FINDINGS: Brain: A progressive medial left frontal lobe infarct is present. This is just anterior and medial to the areas of restricted diffusion on the most recent MRI scan. More remote encephalomalacia is present inferiorly in the left frontal lobe. The subacute posterior left frontal lobe infarct is also noted with some suggestion of cortical laminar necrosis. White matter changes are present on the right without cortical infarct. Basal ganglia are intact. The brainstem and cerebellum are within normal limits. Vascular: Dense atherosclerotic calcifications are present within the cavernous internal carotid arteries bilaterally and at the dural margin of both vertebral arteries. No hyperdense vessel is present. Skull: Calvarium is intact. No focal lytic or blastic lesions are present. Sinuses/Orbits: The paranasal sinuses and mastoid air cells are clear. Bilateral lens replacements are present. Globes and orbits are within normal limits. IMPRESSION: 1. Progressive medial anterior left frontal lobe infarct, likely within the ACA territory. 2. Subacute posterior left frontal lobe infarct. 3. Stable remote left frontal lobe encephalomalacia. 4. Atherosclerosis. Electronically Signed   By: [place] Morelle M.D.   On: 05/15/2018 16:05        Scheduled Meds: . Chlorhexidine Gluconate Cloth  6 each Topical Q0600  . collagenase  1 application Topical Daily  . insulin aspart  0-9 Units Subcutaneous Q6H  . insulin glargine  10 Units  Subcutaneous Daily  . levalbuterol  0.63 mg Nebulization TID  . methylPREDNISolone (SOLU-MEDROL) injection  40 mg Intravenous Daily  . metoprolol tartrate  5 mg  Intravenous Q6H  . mupirocin ointment  1 application Nasal BID  . pantoprazole (PROTONIX) IV  40 mg Intravenous Q24H  . sodium chloride flush  3 mL Intravenous Q12H  . sodium chloride flush  3 mL Intravenous Q12H   Continuous Infusions: . sodium chloride    . fluconazole (DIFLUCAN) IV    . heparin 950 Units/hr (05/17/18 1038)  . piperacillin-tazobactam (ZOSYN)  IV 3.375 g (05/17/18 0844)  .  sodium bicarbonate  infusion 1000 mL 75 mL/hr at 05/16/18 2344  . vancomycin       LOS: 2 days     Georgette Shell, MD [facility]  If 7PM-7AM, please contact night-coverage [email or web address] Password [email or web address] 05/17/2018, 1:29 PM

## 2018-05-17 NOTE — Progress Notes (Signed)
Daily Progress Note   Patient Name: Kristina Summers       Date: 05/17/2018 DOB: 1935-08-18  Age: 83 y.o. MRN#: [id number] Attending Physician: Georgette Shell, MD Primary Care Physician: Patient, No Pcp Per Admit Date: 05/15/2018  Reason for Consultation/Follow-up: Establishing goals of care  Subjective: I saw and examined Kristina Summers this afternoon.  She remains nonverbal but is much more alert and interactive.  Length of Stay: 2  Current Medications: Scheduled Meds:  . apixaban  2.5 mg Oral BID  . chlorhexidine  15 mL Mouth Rinse BID  . Chlorhexidine Gluconate Cloth  6 each Topical Q0600  . collagenase  1 application Topical Daily  . doxycycline  100 mg Oral Q12H  . feeding supplement  1 Container Oral TID BM  . insulin aspart  0-9 Units Subcutaneous Q6H  . insulin glargine  10 Units Subcutaneous Daily  . levalbuterol  0.63 mg Nebulization TID  . mouth rinse  15 mL Mouth Rinse q12n4p  . metoprolol tartrate  5 mg Intravenous Q6H  . mupirocin ointment  1 application Nasal BID  . pantoprazole (PROTONIX) IV  40 mg Intravenous Q24H  . [START ON 05/18/2018] predniSONE  10 mg Oral Q breakfast  . sodium chloride flush  3 mL Intravenous Q12H  . sodium chloride flush  3 mL Intravenous Q12H    Continuous Infusions: . sodium chloride    . fluconazole (DIFLUCAN) IV 100 mg (05/17/18 1600)  . piperacillin-tazobactam (ZOSYN)  IV 3.375 g (05/17/18 1713)  .  sodium bicarbonate  infusion 1000 mL 75 mL/hr at 05/17/18 1602    PRN Meds: sodium chloride, acetaminophen **OR** acetaminophen, bisacodyl, HYDROmorphone (DILAUDID) injection, levalbuterol, ondansetron **OR** ondansetron (ZOFRAN) IV, polyethylene glycol, sodium chloride flush, sodium phosphate  Physical Exam         General: Alert,  awake, in no acute distress. Much more eye contact and interaction, but still cannot participate in conversation HEENT: No bruits, no goiter, no JVD Heart: Regular rate and rhythm. No murmur appreciated. Lungs: Good air movement, coarse throughout Abdomen: Soft, nontender, nondistended, positive bowel sounds.  Ext: RUE in sling Skin: Warm and dry  Vital Signs: BP (!) 147/83 (BP Location: Right Wrist)   Pulse 78   Temp 97.7 F (36.5 C) (Oral)   Resp 16   Ht 5\' 6"  (1.676  m)   Wt 89.4 kg   SpO2 92%   BMI 31.81 kg/m  SpO2: SpO2: 92 % O2 Device: O2 Device: Nasal Cannula O2 Flow Rate: O2 Flow Rate (L/min): 2 L/min  Intake/output summary:   Intake/Output Summary (Last 24 hours) at 05/17/2018 1859 Last data filed at 05/17/2018 1813 Gross per 24 hour  Intake 1987.16 ml  Output 350 ml  Net 1637.16 ml   LBM: Last BM Date: (unknown) Baseline Weight: Weight: 87.8 kg Most recent weight: Weight: 89.4 kg       Palliative Assessment/Data:    Flowsheet Rows     Most Recent Value  Intake Tab  Referral Department  Hospitalist  Unit at Time of Referral  ER  Date Notified  05/16/18  Palliative Care Type  New Palliative care  Reason for referral  Clarify Goals of Care  Date of Admission  05/15/18  Date first seen by Palliative Care  05/16/18  # of days Palliative referral response time  0 Day(s)  # of days IP prior to Palliative referral  1  Clinical Assessment  Palliative Performance Scale Score  30%  Psychosocial & Spiritual Assessment  Palliative Care Outcomes  Patient/Family meeting held?  Yes  Who was at the meeting?  daughter via phone      Patient Active Problem List   Diagnosis Date Noted  . Acute encephalopathy 05/15/2018  . HCAP (healthcare-associated pneumonia) 05/15/2018  . Aspiration pneumonia ([place])   . Acute renal failure superimposed on stage 4 chronic kidney disease ([place])   . Cerebral embolism with cerebral infarction 05/09/2018  . Right sided weakness  05/08/2018  . Acute ischemic stroke ([place])   . Labile blood glucose   . Benign essential HTN   . Leukocytosis   . CVA (cerebral vascular accident) ([place]) 04/18/2018  . CHF exacerbation ([place]) 04/10/2018  . Acute exacerbation of congestive heart failure ([place]) 03/20/2018  . Gastrointestinal hemorrhage   . Acute on chronic systolic CHF (congestive heart failure) ([place]) 03/13/2018  . Decreased hearing 02/03/2018  . History of myocardial infarction 02/03/2018  . Methicillin resistant Staphylococcus aureus infection 02/03/2018  . Acute blood loss anemia 12/22/2017  . MRSA bacteremia 12/12/2017  . CKD (chronic kidney disease) stage 4, GFR 15-29 ml/min (HCC) 12/11/2017  . Foot ulcer, right ([place]) 12/11/2017  . 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 Severe aortic stenosis 10/11/2017  . Hypochromic anemia 08/25/2017  . Essential hypertension 06/10/2017  . Type II diabetes mellitus with renal manifestations ([place]) 06/10/2017  . Acute on chronic congestive heart failure ([place]) 01/28/2017  . Fall   . Fracture of distal end of right fibula 05/29/2014  . History of CVA (cerebrovascular accident) 05/24/2014  . Peripheral vascular disease, unspecified ([place]) 01/11/2014  . CAD (coronary artery disease)   . Atrial fibrillation, chronic   . Diabetes mellitus type 2 with peripheral artery disease ([place])   . Hyperlipidemia   . History of GI bleed 04/21/2009  . S/P CABG (coronary artery bypass graft) 11/20/1998    Palliative Care Assessment & Plan   Patient Profile: 83 y.o. female  with past medical history of diabetes, A. fib on Eliquis, Tavr, hypertension, chronic diastolic heart failure, CAD, CVA with recent admission from 1/20-1/23 for acute CVA with resultant pseudobulbar speech and right hemiparesis who was discharged to skilled facility for rehab admitted on 05/15/2018 with concern for recurrent CVA and aspiration pneumonia.  Currently being treated for reversible conditions  including IV antibiotics.  Palliative consulted for goals of  care.  Recommendations/Plan: - Mental status improving today.  Diet advanced and working to transition back to oral anticoagulation.  -Overall goal remains to treat treatable conditions and work to transition home with the support of hospice   Code Status:    Code Status Orders  (From admission, onward)         Start     Ordered   05/15/18 1744  Do not attempt resuscitation/DNR  Continuous    Question Answer Comment  In the event of cardiac or respiratory ARREST Do not call a "code blue"   In the event of cardiac or respiratory ARREST Do not perform Intubation, CPR, defibrillation or ACLS   In the event of cardiac or respiratory ARREST Use medication by any route, position, wound care, and other measures to relive pain and suffering. May use oxygen, suction and manual treatment of airway obstruction as needed for comfort.      05/15/18 1744        Code Status History    Date Active Date Inactive Code Status Order ID Comments User Context   05/15/2018 1744 05/15/2018 1744 DNR [id number]  Eugenie Filler, MD ED   05/08/2018 1730 05/11/2018 2122 DNR [id number]  Dessa Phi, DO ED   04/18/2018 1020 04/21/2018 1732 DNR [id number]  Karmen Bongo, MD ED   04/10/2018 1910 04/15/2018 1907 DNR [id number]  Guilford Shi, MD ED   04/10/2018 1833 04/10/2018 1910 Full Code [id number]  Guilford Shi, MD ED   03/20/2018 2037 03/23/2018 1819 DNR [id number]  Norval Morton, MD ED   03/13/2018 1457 03/18/2018 1556 DNR [id number]  Karmen Bongo, MD ED   12/22/2017 2104 12/26/2017 1608 Full Code [id number]  Cristy Folks, MD Inpatient   12/11/2017 1937 12/16/2017 1656 DNR [id number]  Ivor Costa, MD ED   11/08/2017 0942 11/10/2017 1500 Full Code [id number]  Eileen Stanford, PA-C Inpatient   10/11/2017 1834 10/12/2017 1729 Full Code [id number]  Sherren Mocha, MD Inpatient   08/25/2017 1500 08/29/2017 1357 DNR [id number]  Vita Erm, NP Inpatient   06/10/2017 1951 06/13/2017 1643 DNR [id number]  Ivor Costa, MD ED   06/10/2017 1924 06/10/2017 1951 Full Code [id number]  Ivor Costa, MD ED   01/28/2017 1147 01/30/2017 1921 Full Code [id number]  Roxan Hockey, MD ED   05/29/2014 1506 06/03/2014 1700 Full Code [id number]  Bonnielee Haff, MD Inpatient   08/23/2013 1723 08/27/2013 1412 Full Code [id number]  Kelvin Cellar, MD ED   11/18/2011 1755 11/19/2011 1232 Full Code [id number]  Laurine Blazer, RN Inpatient   06/17/2011 2135 06/18/2011 1850 Full Code [id number]  Lasandra Beech, RN Inpatient    Advance Directive Documentation     Most Recent Value  Type of Advance Directive  Healthcare Power of Attorney [daughter states pt has a most form]  Pre-existing out of facility DNR order (yellow form or pink MOST form)  -  "MOST" Form in Place?  -       Prognosis:   < 6 months  Discharge Planning:  Home with Hospice once medically ready for discharge  Care plan was discussed with RN  Thank you for allowing the Palliative Medicine Team to assist in the care of this patient.   Total Time 20 Prolonged Time Billed No      Greater than 50%  of this time was spent counseling and coordinating care related to the above assessment and plan.  Micheline Rough, MD  Please contact Palliative Medicine Team phone at [phone] for questions  and concerns.

## 2018-05-17 NOTE — Progress Notes (Signed)
Initial Nutrition Assessment  DOCUMENTATION CODES:   Obesity unspecified  INTERVENTION:   Provide Boost Breeze po TID, each supplement provides 250 kcal and 9 grams of protein  NUTRITION DIAGNOSIS:   Inadequate oral intake related to dysphagia as evidenced by meal completion < 25%.  GOAL:   Patient will meet greater than or equal to 90% of their needs  MONITOR:   PO intake, Supplement acceptance, Labs, Weight trends, I & O's, GOC  REASON FOR ASSESSMENT:   Malnutrition Screening Tool   ASSESSMENT:   83 year old female history of diabetes, A. fib on Eliquis, status post TAVR 11/08/2017, hypertension, chronic diastolic heart failure, coronary artery disease status post CABG recently hospitalized from 05/08/2018 to 05/11/2018 for acute CVA with resultant pseudobulbar speech and right hemiparesis and discharged on apixaban.  Patient presented back with altered mental status.  Concern for progressive recurrent CVA and aspiration pneumonia.  Patient admitted placed on IV antibiotics and goal was to treat the reversible things.  Palliative care consultation pending for goals of care.  Patient nonverbal and unable to provide nutrition history. Pt with aphasia, h/o CVA. No family present during visit.  Pt was admitted at [facility] from 12/31-1/3. At that time, pt was eating 100% of meals with no issues with appetite.  SLP evaluated today and recommends dysphagia 3 diet. Pt had a Boost Breeze supplement at bedside. Pt drank a few sips for RD at bedside. Will continue this supplement. White patches noted on tongue. Per SLP note, family to bring dentures for patient to improve dentition. Family reports feedings are for comfort.   Per weight records, pt has lost 15 lb since May 2019 (7% wt loss x 8 months, insignificant for time frame).   Medications reviewed. Labs reviewed: CBGs: 241- 260 GFR: 18  NUTRITION - FOCUSED PHYSICAL EXAM:    Most Recent Value  Orbital Region  No depletion  Upper Arm  Region  No depletion  Thoracic and Lumbar Region  Unable to assess  Buccal Region  Mild depletion  Temple Region  Mild depletion  Clavicle Bone Region  No depletion  Clavicle and Acromion Bone Region  No depletion  Scapular Bone Region  No depletion  Dorsal Hand  No depletion  Patellar Region  No depletion  Anterior Thigh Region  No depletion  Posterior Calf Region  Mild depletion  Edema (RD Assessment)  Severe       Diet Order:   Diet Order            DIET DYS 3 Room service appropriate? Yes; Fluid consistency: Thin  Diet effective now              EDUCATION NEEDS:   Not appropriate for education at this time  Skin:  Skin Assessment: Reviewed RN Assessment  Last BM:  PTA  Height:   Ht Readings from Last 1 Encounters:  05/15/18 5\' 6"  (1.676 m)    Weight:   Wt Readings from Last 1 Encounters:  05/17/18 89.4 kg    Ideal Body Weight:  59.1 kg  BMI:  Body mass index is 31.81 kg/m.  Estimated Nutritional Needs:   Kcal:  1600-1800  Protein:  70-80g  Fluid:  1.8L/day  Clayton Bibles, MS, RD, LDN [place] Dietitian Pager: [phone] After Hours Pager: [phone]

## 2018-05-18 LAB — COMPREHENSIVE METABOLIC PANEL
ALT: 43 U/L (ref 0–44)
AST: 47 U/L — ABNORMAL HIGH (ref 15–41)
Albumin: 2.4 g/dL — ABNORMAL LOW (ref 3.5–5.0)
Alkaline Phosphatase: 136 U/L — ABNORMAL HIGH (ref 38–126)
Anion gap: 14 (ref 5–15)
BILIRUBIN TOTAL: 2.4 mg/dL — AB (ref 0.3–1.2)
BUN: 109 mg/dL — ABNORMAL HIGH (ref 8–23)
CO2: 24 mmol/L (ref 22–32)
Calcium: 7.4 mg/dL — ABNORMAL LOW (ref 8.9–10.3)
Chloride: 95 mmol/L — ABNORMAL LOW (ref 98–111)
Creatinine, Ser: 2.4 mg/dL — ABNORMAL HIGH (ref 0.44–1.00)
GFR calc Af Amer: 21 mL/min — ABNORMAL LOW (ref >60)
GFR calc non Af Amer: 18 mL/min — ABNORMAL LOW (ref >60)
Glucose, Bld: 437 mg/dL — ABNORMAL HIGH (ref 70–99)
Potassium: 4.1 mmol/L (ref 3.5–5.1)
Sodium: 133 mmol/L — ABNORMAL LOW (ref 135–145)
Total Protein: 6.4 g/dL — ABNORMAL LOW (ref 6.5–8.1)

## 2018-05-18 LAB — LEGIONELLA PNEUMOPHILA SEROGP 1 UR AG: L. pneumophila Serogp 1 Ur Ag: NEGATIVE

## 2018-05-18 LAB — CBC
HEMATOCRIT: 26.6 % — AB (ref 36.0–46.0)
HEMOGLOBIN: 7.8 g/dL — AB (ref 12.0–15.0)
MCH: 27.2 pg (ref 26.0–34.0)
MCHC: 29.3 g/dL — ABNORMAL LOW (ref 30.0–36.0)
MCV: 92.7 fL (ref 80.0–100.0)
Platelets: 103 10*3/uL — ABNORMAL LOW (ref 150–400)
RBC: 2.87 MIL/uL — ABNORMAL LOW (ref 3.87–5.11)
RDW: 20.3 % — ABNORMAL HIGH (ref 11.5–15.5)
WBC: 15.6 10*3/uL — ABNORMAL HIGH (ref 4.0–10.5)
nRBC: 0.2 % (ref 0.0–0.2)

## 2018-05-18 LAB — URINE CULTURE: Culture: >=100000 — AB

## 2018-05-18 LAB — GLUCOSE, CAPILLARY
Glucose-Capillary: 262 mg/dL — ABNORMAL HIGH (ref 70–99)
Glucose-Capillary: 269 mg/dL — ABNORMAL HIGH (ref 70–99)
Glucose-Capillary: 306 mg/dL — ABNORMAL HIGH (ref 70–99)
Glucose-Capillary: 406 mg/dL — ABNORMAL HIGH (ref 70–99)
Glucose-Capillary: 426 mg/dL — ABNORMAL HIGH (ref 70–99)
Glucose-Capillary: 430 mg/dL — ABNORMAL HIGH (ref 70–99)
Glucose-Capillary: 457 mg/dL — ABNORMAL HIGH (ref 70–99)

## 2018-05-18 MED ORDER — FUROSEMIDE 10 MG/ML IJ SOLN
40.0000 mg | Freq: Once | INTRAMUSCULAR | Status: AC
  Administered 2018-05-18: 40 mg via INTRAVENOUS
  Filled 2018-05-18: qty 4

## 2018-05-18 MED ORDER — LEVALBUTEROL HCL 0.63 MG/3ML IN NEBU
0.6300 mg | INHALATION_SOLUTION | Freq: Two times a day (BID) | RESPIRATORY_TRACT | Status: DC
  Administered 2018-05-18 – 2018-05-19 (×2): 0.63 mg via RESPIRATORY_TRACT
  Filled 2018-05-18 (×2): qty 3

## 2018-05-18 MED ORDER — LEVOFLOXACIN IN D5W 500 MG/100ML IV SOLN
500.0000 mg | Freq: Once | INTRAVENOUS | Status: AC
  Administered 2018-05-18: 500 mg via INTRAVENOUS
  Filled 2018-05-18: qty 100

## 2018-05-18 NOTE — Progress Notes (Signed)
Completed in and out cath per order and [facility] policy. Patient tolerated well. Pulled 650 mL. Attending nurse notified.

## 2018-05-18 NOTE — Progress Notes (Signed)
PROGRESS NOTE    Kristina Summers  YVO:[id number] DOB: 1935/10/07 DOA: 05/15/2018 PCP: Patient, No Pcp Per  Brief Narrative:83 year old female history of diabetes, A. fib on Eliquis, status post TAVR 11/08/2017, hypertension, chronic diastolic heart failure, coronary artery disease status post CABG recently hospitalized from 05/08/2018 to 05/11/2018 for acute CVA with resultant pseudobulbar speech and right hemiparesis and discharged on apixaban. Patient presented back with altered mental status. Concern for progressive recurrent CVA and aspiration pneumonia. Patient admitted placed on IV antibiotics and goal was to treat the reversible things. Palliative care consultation pending for goals of care.  05/17/2018 met with patient patient's 2 daughters at bedside.  Per family patient is much more awake and alert compared to 2 days ago.  She is able see note she is able to smile however I have not heard her talk when the daughter asked her if she wants to go home she nodded her head yes with a smile.  The plan is for her to go home with hospice when she is ready for discharge  Assessment & Plan:   Principal Problem:   Acute encephalopathy Active Problems:   CAD (coronary artery disease)   Atrial fibrillation, chronic   Diabetes mellitus type 2 with peripheral artery disease (HCC)   Hyperlipidemia   Acute on chronic congestive heart failure (HCC)   Essential hypertension   Type II diabetes mellitus with renal manifestations (HCC)   Severe aortic stenosis   S/P TAVR (transcatheter aortic valve replacement)   CKD (chronic kidney disease) stage 4, GFR 15-29 ml/min (HCC)   Acute on chronic systolic CHF (congestive heart failure) (HCC)   CVA (cerebral vascular accident) ([place])   Right sided weakness   HCAP (healthcare-associated pneumonia)   Aspiration pneumonia ([place])   Acute renal failure superimposed on stage 4 chronic kidney disease ([place])  1 acute encephalopathy-multifactorial secondary to  pneumonia versus recurrent stroke.  Patient has made significant improvement since 2 days ago.  Today she is much awake and able to answer yes and no by nodding her head.  Per discharge summary from 05/11/2018 neurology and stroke team follow the patient during the hospitalization and recommended no further stroke work-up and to continue anticoagulation with Eliquis patient is being by speech therapy and recommends dysphagia 3 diet.  2. Probable healthcare pneumonia versus aspiration pneumonia-continue vancomycin and Zosyn repeat chest x-ray shows pulmonary edema slightly improved with persistent right pleural effusion and basilar atelectasis..  Will give 1 dose of IV Lasix today and monitor. MRSA PCR positive.  Blood cultures pending.  3.??Acute on chronic systolic heart failure Chest x-ray concerning for increasing vascular congestion. Patient presented with increased O2 requirements and shortness of breath.  4. Acute on chronic renal failure stage IV creatinine trending up in spite of slow IV hydration.may need to have Foley catheter placed if continues to have acute bladder urine retention to improve renal failure.  5. Probable progressive CVA likely embolic -noted on CT head. Per last discharge summary from 05/11/2018 neurology assess patient during that hospitalization recommended no further stroke work-up and patient to follow-up with neurology in the outpatient setting. Was recommended to continue Eliquis. Per ED physician family wanted to only treat reversible things at this time and not interested in any further invasive work-up. Family noted to have contacted [facility] [place] [facility] to evaluate patient in the ED Patient currently n.p.o. and not swallowing per RN. Discontinue Eliquis and placed on IV heparin for secondary stroke prophylaxis for now pending palliative care consultation for  goals of care.   6. Chronic A. fib status post TAVR Eliquis restarted.  Continue  Lopressor.  7. Hypertension we will change to p.o. Lopressor.   8. Type II Hemoglobin A1c was 7.2 during last hospitalization.Patient currently n.p.o. CBG of 219 this morning. Place on Lantus 5 units daily. Continue sliding scale insulin.  9. Normocytic anemia No overt bleeding. Hemoglobin currently stable at 8.3. Follow H&H.  10. Gastroesophageal reflux disease ContinuePPI.  11. Temporal headache Patient noted to have a history of temporal arteritis diagnosis and was initiated on prednisone.  She was supposed to be continued on prednisone 10 mg daily which will be started today DC Solu-Medrol.   12. Right upper extremity swelling Prior to admission was noted that patient may have had a IV infiltration in the right upper extremity. Patient on full dose anticoagulation. Place right upper extremity admission sling. Elevate.  13 thrush we will give Diflucan  14 acute urinary retention patient had to be ironed out twice with bladder scan showing more than 400 cc of urine.      Nutrition Problem: Inadequate oral intake Etiology: dysphagia     Signs/Symptoms: meal completion < 25%    Interventions: Boost Breeze  Estimated body mass index is 32.74 kg/m as calculated from the following:   Height as of this encounter: _0  (1.676 m).   Weight as of this encounter: 92 kg.  DVT prophylaxis: Heparin Code Status: DO NOT RESUSCITATE Family Communication: None available today Disposition Plan: Pending clinical improvement  Consultants: Palliative care   Procedures none Antimicrobials: Vanco and Zosyn  Subjective: Patient a little restless today compared to yesterday probably secondary to urine retention  Objective: Vitals:   05/18/18 0500 05/18/18 0609 05/18/18 0806 05/18/18 1257  BP:  137/75  (!) 149/87  Pulse:  70  67  Resp:  14  16  Temp:      TempSrc:      SpO2:  95% 96% 94%  Weight: 92 kg     Height:        Intake/Output Summary  (Last 24 hours) at 05/18/2018 1305 Last data filed at 05/18/2018 1250 Gross per 24 hour  Intake 4533.79 ml  Output 1000 ml  Net 3533.79 ml   Filed Weights   05/15/18 1900 05/17/18 0538 05/18/18 0500  Weight: 87.8 kg 89.4 kg 92 kg    Examination:  General exam: Appears calm and comfortable  Respiratory system: Scattered rhonchi and decreased breath sounds at the bases to auscultation. Respiratory effort normal. Cardiovascular system: S1 & S2 heard, RRR. No JVD, murmurs, rubs, gallops or clicks. No pedal edema. Gastrointestinal system: Abdomen is nondistended, soft and nontender. No organomegaly or masses felt. Normal bowel sounds heard. Central nervous system: Alert and oriented. No focal neurological deficits. Extremities: 1+ bilateral pitting edema and right upper extremity edema Skin: No rashes, lesions or ulcers Psychiatry: Judgement and insight appear normal. Mood & affect appropriate.     Data Reviewed: I have personally reviewed following labs and imaging studies  CBC: Recent Labs  Lab 05/15/18 1450 05/16/18 0508 05/17/18 0914  WBC 12.4* 10.5 16.1*  NEUTROABS 12.0*  --  15.0*  HGB 8.3* 8.3* 7.7*  HCT 27.8* 28.7* 26.7*  MCV 93.0 93.2 91.8  PLT 120* 132* 539*   Basic Metabolic Panel: Recent Labs  Lab 05/15/18 1450 05/15/18 1928 05/16/18 0508 05/17/18 0919  NA 134*  --  134* 137  K 4.6  --  4.8 4.3  CL 105  --  104  102  CO2 19*  --  15* 22  GLUCOSE 217*  --  252* 241*  BUN 98*  --  104* 110*  CREATININE 2.04*  --  2.32* 2.40*  CALCIUM 7.7*  --  7.8* 7.6*  MG  --  2.6*  --   --    GFR: Estimated Creatinine Clearance: 21.4 mL/min (A) (by C-G formula based on SCr of 2.4 mg/dL (H)). Liver Function Tests: Recent Labs  Lab 05/15/18 1450  AST 43*  ALT 38  ALKPHOS 142*  BILITOT 2.9*  PROT 6.0*  ALBUMIN 2.3*   No results for input(s): LIPASE, AMYLASE in the last 168 hours. No results for input(s): AMMONIA in the last 168 hours. Coagulation  Profile: Recent Labs  Lab 05/16/18 0508  INR 1.49   Cardiac Enzymes: No results for input(s): CKTOTAL, CKMB, CKMBINDEX, TROPONINI in the last 168 hours. BNP (last 3 results) No results for input(s): PROBNP in the last 8760 hours. HbA1C: No results for input(s): HGBA1C in the last 72 hours. CBG: Recent Labs  Lab 05/17/18 1756 05/18/18 0048 05/18/18 0645 05/18/18 0813 05/18/18 1146  GLUCAP 376* 306* 269* 262* 406*   Lipid Profile: No results for input(s): CHOL, HDL, LDLCALC, TRIG, CHOLHDL, LDLDIRECT in the last 72 hours. Thyroid Function Tests: No results for input(s): TSH, T4TOTAL, FREET4, T3FREE, THYROIDAB in the last 72 hours. Anemia Panel: No results for input(s): VITAMINB12, FOLATE, FERRITIN, TIBC, IRON, RETICCTPCT in the last 72 hours. Sepsis Labs: Recent Labs  Lab 05/15/18 1455 05/15/18 1529 05/15/18 1928 05/16/18 0508 05/17/18 0914  PROCALCITON  --  0.31  --  0.32 0.24  LATICACIDVEN 1.2  --  1.9  --   --     Recent Results (from the past 240 hour(s))  Blood culture (routine x 2)     Status: None (Preliminary result)   Collection Time: 05/15/18  2:50 PM  Result Value Ref Range Status   Specimen Description   Final    BLOOD LEFT WRIST Performed at [facility] [address], [place], [place] [address]    Special Requests   Final    BOTTLES DRAWN AEROBIC AND ANAEROBIC Blood Culture adequate volume Performed at [facility] [address], [place], [place] [address]    Culture   Final    NO GROWTH 3 DAYS Performed at [facility], [place] [address] [place], [place] [address]    Report Status PENDING  Incomplete  Blood culture (routine x 2)     Status: None (Preliminary result)   Collection Time: 05/15/18  2:50 PM  Result Value Ref Range Status   Specimen Description   Final    BLOOD LEFT ANTECUBITAL Performed at [facility] [address], [place], [place] [address]    Special Requests    Final    BOTTLES DRAWN AEROBIC AND ANAEROBIC Blood Culture results may not be optimal due to an excessive volume of blood received in culture bottles Performed at [facility] [address] [place], [place] [address]    Culture   Final    NO GROWTH 3 DAYS Performed at [facility], [place] [address] [place],  [address]    Report Status PENDING  Incomplete  Culture, Urine     Status: Abnormal   Collection Time: 05/15/18  5:30 PM  Result Value Ref Range Status   Specimen Description   Final    URINE, CLEAN CATCH Performed at [facility], [place] [address] [place], [place] [address]    Special Requests   Final    NONE Performed at [facility], [place] [address] [place], [place] [address]    Culture >=100,000 COLONIES/mL ENTEROCOCCUS FAECALIS (A)  Final   Report Status 05/18/2018 FINAL  Final   Organism ID, Bacteria ENTEROCOCCUS FAECALIS (A)  Final      Susceptibility   Enterococcus faecalis - MIC*    AMPICILLIN <=2 SENSITIVE Sensitive     LEVOFLOXACIN 2 SENSITIVE Sensitive     NITROFURANTOIN 64 INTERMEDIATE Intermediate     VANCOMYCIN 1 SENSITIVE Sensitive     * >=100,000 COLONIES/mL ENTEROCOCCUS FAECALIS  MRSA PCR Screening     Status: Abnormal   Collection Time: 05/16/18  8:57 AM  Result Value Ref Range Status   MRSA by PCR POSITIVE (A) NEGATIVE Final    Comment:        The GeneXpert MRSA Assay (FDA approved for NASAL specimens only), is one component of a comprehensive MRSA colonization surveillance program. It is not intended to diagnose MRSA infection nor to guide or monitor treatment for MRSA infections. RESULT CALLED TO, READ BACK BY AND VERIFIED WITH: Richelle Ito [id number] @ [address] Performed at [facility] [address] [place],  [address]          Radiology Studies: Dg Chest 1 View  Result Date: 05/17/2018 CLINICAL DATA:  Hypoxia EXAM:  CHEST  1 VIEW COMPARISON:  Portable exam 1332 hours compared to 05/15/2018 FINDINGS: Enlargement of cardiac silhouette post CABG and TAVR. Pulmonary vascular congestion. Atherosclerotic calcifications aorta. Diffuse pulmonary edema, slightly improved in RIGHT upper lobe. RIGHT pleural effusion and basilar atelectasis. No pneumothorax. Bones demineralized. IMPRESSION: Pulmonary edema slightly improved in LEFT lung with persistent RIGHT pleural effusion and basilar atelectasis Electronically Signed   By: Lavonia Dana M.D.   On: 05/17/2018 15:42        Scheduled Meds: . apixaban  2.5 mg Oral BID  . chlorhexidine  15 mL Mouth Rinse BID  . Chlorhexidine Gluconate Cloth  6 each Topical Q0600  . collagenase  1 application Topical Daily  . doxycycline  100 mg Oral Q12H  . feeding supplement  1 Container Oral TID BM  . insulin aspart  0-9 Units Subcutaneous Q6H  . insulin glargine  10 Units Subcutaneous Daily  . levalbuterol  0.63 mg Nebulization BID  . mouth rinse  15 mL Mouth Rinse q12n4p  . metoprolol tartrate  5 mg Intravenous Q6H  . mupirocin ointment  1 application Nasal BID  . pantoprazole (PROTONIX) IV  40 mg Intravenous Q24H  . predniSONE  10 mg Oral Q breakfast  . sodium chloride flush  3 mL Intravenous Q12H  . sodium chloride flush  3 mL Intravenous Q12H   Continuous Infusions: . sodium chloride 10 mL/hr at 05/18/18 0600  . fluconazole (DIFLUCAN) IV Stopped (05/17/18 1700)  . piperacillin-tazobactam (ZOSYN)  IV 3.375 g (05/18/18 0850)  .  sodium bicarbonate  infusion 1000 mL 75 mL/hr at 05/18/18 0606     LOS: 3 days     Georgette Shell, MD [facility]   If 7PM-7AM, please contact night-coverage [email or web address] Password [email or web address] 05/18/2018, 1:05 PM

## 2018-05-18 NOTE — Progress Notes (Signed)
Unable to get patient temperature orally or axillary, attempted several times by  family/RN/NT.  Attempted rectal temp x2; rectal temp showed 94.43F both times.  There was stool present on thermometer.  Patient tolerated well.  Patient was 406 at noon CBG check. Administered 14 units of insulin per Dr. Rodena Piety. Patient has been eating regular DYS 3 diet without diabetic restrictions.  Rechecked x2 since then and patient CBG has gone up. Diet has been changed to diabetic DYS 3 diet.  Dr. Rodena Piety aware; she will order lab work.  Warm blankets placed on patient; will recheck temperature in 1 hour.  Patient mental status remains the same as it was this AM; patient arouses easily; eats/drinks if awake, but if left alone falls asleep.  Patient was given IV lasix per orders earlier this afternoon.  Patient has not yet voided.  Will bladder scan patient and continue to monitor closely.

## 2018-05-18 NOTE — Progress Notes (Addendum)
[facility] Hospital Liaison Family contacted [facility] referral center 05/15/18 requesting hospice services at home after discharge (instead of returning to [facility]). Chart and patient information reviewed by [facility] physician and eligibility confirmed 05/15/18.   Spoke with daughters Nunzio Cory and [name] at bedside 05/15/18 to initiate education related to hospice philosophy, services and team approach to care. They verbalized understanding of information given.  Per discussion, plan is for discharge to home by [facility] after DME has been delivered to home.   Please send signed and completed DNR form home with patient/family.   Patient will need prescriptions for discharge comfort medications.  DME needs have been discussed, patient currently has a cane, walker and BSC. Hospital bed and oxygen setup ordered by [facility] DME specialist. [place] to arrange delivery to the home and coordinate with daughter Nunzio Cory. Home address has been verified and is correct in the chart.  [facility] aware of the above and will schedule RN visit at home after discharge.  [facility] information and contact numbers given to daughters during this visit. Above information shared with RNCM Cookie.  Addendum 1330: Spoke with RNCM Cookie to confirm patient is not discharging home today. Wheelchair ordered for delivery to home per [facility] Cookie's conversation with daughter. [facility] will continue to follow and arrange for [facility] RN to see patient at home after discharge.    Please call with hospice related questions. Thank you for this referral. Erling Conte, [facility] Liaison [place] are listed on AMION under Hospice and [place].

## 2018-05-18 NOTE — Progress Notes (Signed)
200 ml dark yellow urine returned on I/O cath with sediment. Urine culture obtained via sterile test tube procedure.  Patient asleep throughout procedure, tolerated well.  Patient temp currently 95.2 rectally.  Dr. Rodena Piety aware.  15 units of novolog administered per MD order.  Will recheck at 1830 and notify MD.  Will continue to monitor.

## 2018-05-18 NOTE — Progress Notes (Signed)
SLP Cancellation Note  Patient Details Name: Kristina Summers MRN: [id number] DOB: 03-08-1936   Cancelled treatment:       Reason Eval/Treat Not Completed: Other (comment)(pt is moaning and per RN is holding urine, RN reports pt tolerating po well, will follow up x1 to assure tolerance and will hold for today)   Macario Golds 05/18/2018, 3:26 PM  Luanna Salk, [facility] SLP [place] Pager [phone] Office [phone]

## 2018-05-18 NOTE — Progress Notes (Signed)
Patient CBG 430 at 1816.  Dr. Rodena Piety on floor to see patient.  SHe is aware of patient's CBG, temperature and that she is unable to void.  Family at bedside, pt's [place].  Patient and MD in agreement that patient will transition to full comfort care and will likely discharge tomorrow to go home with hospice.  Foley catheter to be placed for comfort, night RN notified.  Patient to go home with foley.  Plan of care discussed with MD, family and night RN.  Patient remains comfortably resting at this time.

## 2018-05-18 NOTE — Progress Notes (Addendum)
Pt had no UOP this AM.  Bladder scanned; showed 430 ml in bladder.  Dr. Rodena Piety on floor rounding; order to perform I/O Cath received.  Patient had 650 ml of concentrated urine from I/O Cath.  Tolerated well.  Patient started to get restless prior to cath; comfortable after.  Family at bedside.  Will continue to monitor.

## 2018-05-18 NOTE — Care Management Important Message (Signed)
Important Message  Patient Details  Name: Kristina Summers MRN: [id number] Date of Birth: [birth year]   Medicare Important Message Given:  Yes    Kerin Salen 05/18/2018, 10:33 AMImportant Message  Patient Details  Name: Kristina Summers MRN: [id number] Date of Birth: 1935/10/12   Medicare Important Message Given:  Yes    Kerin Salen 05/18/2018, 10:33 AM

## 2018-05-18 NOTE — Progress Notes (Signed)
Pharmacy Antibiotic Note  Kristina Summers is a 83 y.o. female admitted on 05/15/2018 with history of anemia, CAD s/p CABG, CKD, CHF, GERD, CVA, hyperlipidemia, hypertension, A. fib, s/p TAVR, diabetes, presents ED today via EMS from [facility] for evaluation of altered mental status. Pharmacy has been consulted for vancomycin and zosyn dosing for PNA.  Today, 05/18/2018:  D3 abx  WBC up (on steroids)  Afebrile since admission  Enterococcus/yeast in urine; asymptomatic - likely colonization (but covered by current abx for PNA/thrush)  SCr continues to rise (vanc-related?)  PCT slightly elevated on admission, trending down  Plan:  Continue Zosyn 3.375g IV Q8H infused over 4hrs.  Daily SCr  Pharmacy will sign off note-writing for Zosyn unless adjustment needed  Doxy and Diflucan per MD - dosing appropriate  Height: 5\' 6"  (167.6 cm) Weight: 202 lb 13.2 oz (92 kg) IBW/kg (Calculated) : 59.3  Temp (24hrs), Avg:97.8 F (36.6 C), Min:97.7 F (36.5 C), Max:97.8 F (36.6 C)  Recent Labs  Lab 05/15/18 1450 05/15/18 1455 05/15/18 1928 05/16/18 0508 05/17/18 0914 05/17/18 0919  WBC 12.4*  --   --  10.5 16.1*  --   CREATININE 2.04*  --   --  2.32*  --  2.40*  LATICACIDVEN  --  1.2 1.9  --   --   --     Estimated Creatinine Clearance: 21.4 mL/min (A) (by C-G formula based on SCr of 2.4 mg/dL (H)).    Allergies  Allergen Reactions  . Morphine And Related Nausea And Vomiting and Other (See Comments)    Chest pain, also   . Lisinopril Cough  . Zoloft [Sertraline Hcl] Nausea Only         Antimicrobials this admission: 1/27 cefepime x 1 1/27 clinda x 1 1/27 vanc >>1/29 1/28 zosyn >> 1/29 doxy>>  Dose adjustments this admission:  Microbiology results: 1/27 BCx x2: ngtd 1/27 UCx: e faecalis, yeast on UA >> colonization? 1/27 ur legionella:  1/27 ur strep pneum: neg 1/27 UA: many bact, trace leuk 1/28 MRSA PCR: Positive  Thank you for allowing pharmacy to be a part  of this patient's care.  Reuel Boom, PharmD, BCPS [phone] 05/18/2018, 11:15 AM

## 2018-05-18 NOTE — Progress Notes (Signed)
Pt will discharge home with [facility] [place]. Pt's daughter asked for [facility], this was relayed to in house rep with [facility] [place].

## 2018-05-18 NOTE — Progress Notes (Signed)
Daily Progress Note   Patient Name: Kristina Summers       Date: 05/18/2018 DOB: 03-Oct-1935  Age: 83 y.o. MRN#: [id number] Attending Physician: Georgette Shell, MD Primary Care Physician: Patient, No Pcp Per Admit Date: 05/15/2018  Reason for Consultation/Follow-up: Establishing goals of care  Subjective: I saw and examined Kristina Summers this morning.  She remains nonverbal but is much more alert and interactive.  I called and discussed with daughter, Kristina Summers.  We reviewed plan of care to this point as well as plan for continued treatment of treatable conditions followed by transition home with hospice support.  Daughter inquired about her possible being able to get up to chair later today.  Length of Stay: 3  Current Medications: Scheduled Meds:  . apixaban  2.5 mg Oral BID  . chlorhexidine  15 mL Mouth Rinse BID  . Chlorhexidine Gluconate Cloth  6 each Topical Q0600  . collagenase  1 application Topical Daily  . doxycycline  100 mg Oral Q12H  . feeding supplement  1 Container Oral TID BM  . insulin aspart  0-9 Units Subcutaneous Q6H  . insulin glargine  10 Units Subcutaneous Daily  . levalbuterol  0.63 mg Nebulization TID  . mouth rinse  15 mL Mouth Rinse q12n4p  . metoprolol tartrate  5 mg Intravenous Q6H  . mupirocin ointment  1 application Nasal BID  . pantoprazole (PROTONIX) IV  40 mg Intravenous Q24H  . predniSONE  10 mg Oral Q breakfast  . sodium chloride flush  3 mL Intravenous Q12H  . sodium chloride flush  3 mL Intravenous Q12H    Continuous Infusions: . sodium chloride 10 mL/hr at 05/18/18 0600  . fluconazole (DIFLUCAN) IV Stopped (05/17/18 1700)  . piperacillin-tazobactam (ZOSYN)  IV 3.375 g (05/18/18 0850)  .  sodium bicarbonate  infusion 1000 mL 75 mL/hr at 05/18/18  0606    PRN Meds: sodium chloride, acetaminophen **OR** acetaminophen, bisacodyl, HYDROmorphone (DILAUDID) injection, levalbuterol, ondansetron **OR** ondansetron (ZOFRAN) IV, polyethylene glycol, sodium chloride flush, sodium phosphate  Physical Exam         General: Alert, awake, in no acute distress. Much more eye contact and interaction, but still cannot participate in conversation HEENT: No bruits, no goiter, no JVD Heart: Regular rate and rhythm. No murmur appreciated. Lungs: Good air movement, coarse throughout  Abdomen: Soft, nontender, nondistended, positive bowel sounds.  Ext: RUE in sling Skin: Warm and dry  Vital Signs: BP 137/75 (BP Location: Left Leg)   Pulse 70   Temp 97.8 F (36.6 C) (Oral)   Resp 14   Ht 5\' 6"  (1.676 m)   Wt 92 kg   SpO2 96%   BMI 32.74 kg/m  SpO2: SpO2: 96 % O2 Device: O2 Device: Nasal Cannula O2 Flow Rate: O2 Flow Rate (L/min): 2 L/min  Intake/output summary:   Intake/Output Summary (Last 24 hours) at 05/18/2018 1042 Last data filed at 05/18/2018 0349 Gross per 24 hour  Intake 4533.79 ml  Output 350 ml  Net 4183.79 ml   LBM: Last BM Date: (unknown) Baseline Weight: Weight: 87.8 kg Most recent weight: Weight: 92 kg       Palliative Assessment/Data:    Flowsheet Rows     Most Recent Value  Intake Tab  Referral Department  Hospitalist  Unit at Time of Referral  ER  Date Notified  05/16/18  Palliative Care Type  New Palliative care  Reason for referral  Clarify Goals of Care  Date of Admission  05/15/18  Date first seen by Palliative Care  05/16/18  # of days Palliative referral response time  0 Day(s)  # of days IP prior to Palliative referral  1  Clinical Assessment  Palliative Performance Scale Score  30%  Psychosocial & Spiritual Assessment  Palliative Care Outcomes  Patient/Family meeting held?  Yes  Who was at the meeting?  daughter via phone      Patient Active Problem List   Diagnosis Date Noted  . Acute  encephalopathy 05/15/2018  . HCAP (healthcare-associated pneumonia) 05/15/2018  . Aspiration pneumonia ([place])   . Acute renal failure superimposed on stage 4 chronic kidney disease ([place])   . Cerebral embolism with cerebral infarction 05/09/2018  . Right sided weakness 05/08/2018  . Acute ischemic stroke ([place])   . Labile blood glucose   . Benign essential HTN   . Leukocytosis   . CVA (cerebral vascular accident) ([place]) 04/18/2018  . CHF exacerbation ([place]) 04/10/2018  . Acute exacerbation of congestive heart failure ([place]) 03/20/2018  . Gastrointestinal hemorrhage   . Acute on chronic systolic CHF (congestive heart failure) ([place]) 03/13/2018  . Decreased hearing 02/03/2018  . History of myocardial infarction 02/03/2018  . Methicillin resistant Staphylococcus aureus infection 02/03/2018  . Acute blood loss anemia 12/22/2017  . MRSA bacteremia 12/12/2017  . CKD (chronic kidney disease) stage 4, GFR 15-29 ml/min ([facility]) 12/11/2017  . Foot ulcer, right ([place]) 12/11/2017  . 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 Severe aortic stenosis 10/11/2017  . Hypochromic anemia 08/25/2017  . Essential hypertension 06/10/2017  . Type II diabetes mellitus with renal manifestations ([place]) 06/10/2017  . Acute on chronic congestive heart failure ([place]) 01/28/2017  . Fall   . Fracture of distal end of right fibula 05/29/2014  . History of CVA (cerebrovascular accident) 05/24/2014  . Peripheral vascular disease, unspecified ([place]) 01/11/2014  . CAD (coronary artery disease)   . Atrial fibrillation, chronic   . Diabetes mellitus type 2 with peripheral artery disease ([place])   . Hyperlipidemia   . History of GI bleed 04/21/2009  . S/P CABG (coronary artery bypass graft) 11/20/1998    Palliative Care Assessment & Plan   Patient Profile: 83 y.o. female  with past medical history of diabetes, A. fib on Eliquis, Tavr, hypertension, chronic diastolic heart failure, CAD, CVA with recent  admission  from 1/20-1/23 for acute CVA with resultant pseudobulbar speech and right hemiparesis who was discharged to skilled facility for rehab admitted on 05/15/2018 with concern for recurrent CVA and aspiration pneumonia.  Currently being treated for reversible conditions including IV antibiotics.  Palliative consulted for goals of care.  Recommendations/Plan: - Mental status improved and she has been tolerating PO medications and diet.  -Overall goal remains to treat treatable conditions and work to transition home with the support of hospice once she is medically ready for discharge.  Code Status:    Code Status Orders  (From admission, onward)         Start     Ordered   05/15/18 1744  Do not attempt resuscitation/DNR  Continuous    Question Answer Comment  In the event of cardiac or respiratory ARREST Do not call a "code blue"   In the event of cardiac or respiratory ARREST Do not perform Intubation, CPR, defibrillation or ACLS   In the event of cardiac or respiratory ARREST Use medication by any route, position, wound care, and other measures to relive pain and suffering. May use oxygen, suction and manual treatment of airway obstruction as needed for comfort.      05/15/18 1744        Code Status History    Date Active Date Inactive Code Status Order ID Comments User Context   05/15/2018 1744 05/15/2018 1744 DNR [id number]  Eugenie Filler, MD ED   05/08/2018 1730 05/11/2018 2122 DNR [id number]  Dessa Phi, DO ED   04/18/2018 1020 04/21/2018 1732 DNR [id number]  Karmen Bongo, MD ED   04/10/2018 1910 04/15/2018 1907 DNR [id number]  Guilford Shi, MD ED   04/10/2018 1833 04/10/2018 1910 Full Code [id number]  Guilford Shi, MD ED   03/20/2018 2037 03/23/2018 1819 DNR [id number]  Norval Morton, MD ED   03/13/2018 1457 03/18/2018 1556 DNR [id number]  Karmen Bongo, MD ED   12/22/2017 2104 12/26/2017 1608 Full Code [id number]  Cristy Folks, MD Inpatient   12/11/2017 1937  12/16/2017 1656 DNR [id number]  Ivor Costa, MD ED   11/08/2017 0942 11/10/2017 1500 Full Code [id number]  Eileen Stanford, PA-C Inpatient   10/11/2017 1834 10/12/2017 1729 Full Code [id number]  Sherren Mocha, MD Inpatient   08/25/2017 1500 08/29/2017 1357 DNR [id number]  Vita Erm, NP Inpatient   06/10/2017 1951 06/13/2017 1643 DNR [id number]  Ivor Costa, MD ED   06/10/2017 1924 06/10/2017 1951 Full Code [id number]  Ivor Costa, MD ED   01/28/2017 1147 01/30/2017 1921 Full Code [id number]  Roxan Hockey, MD ED   05/29/2014 1506 06/03/2014 1700 Full Code [id number]  Bonnielee Haff, MD Inpatient   08/23/2013 1723 08/27/2013 1412 Full Code [id number]  Kelvin Cellar, MD ED   11/18/2011 1755 11/19/2011 1232 Full Code [id number]  Laurine Blazer, RN Inpatient   06/17/2011 2135 06/18/2011 1850 Full Code [id number]  Lasandra Beech, RN Inpatient    Advance Directive Documentation     Most Recent Value  Type of Advance Directive  Healthcare Power of Attorney [daughter states pt has a most form]  Pre-existing out of facility DNR order (yellow form or pink MOST form)  -  "MOST" Form in Place?  -       Prognosis:   < 6 months  Discharge Planning:  Home with Hospice once medically ready for discharge  Care plan was discussed with RN, daughter Kristina Summers via phone  Thank you for allowing the Palliative Medicine Team to assist in  the care of this patient.   Total Time 25 Prolonged Time Billed No      Greater than 50%  of this time was spent counseling and coordinating care related to the above assessment and plan.  Micheline Rough, MD  Please contact Palliative Medicine Team phone at [phone] for questions and concerns.

## 2018-05-19 ENCOUNTER — Ambulatory Visit (HOSPITAL_COMMUNITY): Payer: Medicare Other

## 2018-05-19 LAB — CREATININE, SERUM
Creatinine, Ser: 2.16 mg/dL — ABNORMAL HIGH (ref 0.44–1.00)
GFR calc Af Amer: 24 mL/min — ABNORMAL LOW (ref >60)
GFR calc non Af Amer: 21 mL/min — ABNORMAL LOW (ref >60)

## 2018-05-19 LAB — GLUCOSE, CAPILLARY
Glucose-Capillary: 175 mg/dL — ABNORMAL HIGH (ref 70–99)
Glucose-Capillary: 199 mg/dL — ABNORMAL HIGH (ref 70–99)
Glucose-Capillary: 249 mg/dL — ABNORMAL HIGH (ref 70–99)

## 2018-05-19 LAB — CBC
HEMATOCRIT: 25.9 % — AB (ref 36.0–46.0)
Hemoglobin: 7.6 g/dL — ABNORMAL LOW (ref 12.0–15.0)
MCH: 27 pg (ref 26.0–34.0)
MCHC: 29.3 g/dL — ABNORMAL LOW (ref 30.0–36.0)
MCV: 91.8 fL (ref 80.0–100.0)
Platelets: 78 10*3/uL — ABNORMAL LOW (ref 150–400)
RBC: 2.82 MIL/uL — ABNORMAL LOW (ref 3.87–5.11)
RDW: 19.9 % — ABNORMAL HIGH (ref 11.5–15.5)
WBC: 13.1 10*3/uL — ABNORMAL HIGH (ref 4.0–10.5)
nRBC: 0.3 % — ABNORMAL HIGH (ref 0.0–0.2)

## 2018-05-19 MED ORDER — HYDROMORPHONE HCL 2 MG PO TABS: 1.0000 mg | ORAL_TABLET | ORAL | 0 refills | Status: AC | PRN

## 2018-05-19 MED ORDER — HYDROMORPHONE HCL 2 MG PO TABS: 1.0000 mg | ORAL_TABLET | ORAL | Status: DC | PRN

## 2018-05-19 NOTE — Progress Notes (Signed)
Patient transferring home via [facility].  Discharged home with hospice.  IVs removed - WNL.  AVS reviewed with patients daughter. No questions at this time.  Awaiting arrival of transport

## 2018-05-19 NOTE — Discharge Summary (Signed)
Physician Discharge Summary  Rockie Vawter GBT:[id number] DOB: 05-31-1935 DOA: 05/15/2018  PCP: Patient, No Pcp Per  Admit date: 05/15/2018 Discharge date: 05/19/2018  Admitted From: Home Disposition: Home with hospice  Home Health: None Equipment/Devices oxygen for comfort Discharge Condition hospice discharge CODE STATUS: DO NOT RESUSCITATE Diet recommendation: Dysphagia 3 diet for comfort feeding Brief/Interim Summary: 83 year old female history of diabetes, A. fib on Eliquis, status post TAVR 11/08/2017, hypertension, chronic diastolic heart failure, coronary artery disease status post CABG recently hospitalized from 05/08/2018 to 05/11/2018 for acute CVA with resultant pseudobulbar speech and right hemiparesis and discharged on apixaban. Patient presented back with altered mental status. Concern for progressive recurrent CVA and aspiration pneumonia. Patient admitted placed on IV antibiotics and goal was to treat the reversible things. Palliative care consultation pending for goals of care.  05/17/2018 met with patient patient's 2 daughters at bedside. Per family patient is much more awake and alert compared to 2 days ago. She is able see note she is able to smile however I have not heard her talk when the daughter asked her if she wants to go home she nodded her head yes with a smile. The plan is for her to go home with hospice when she is ready for discharge   Discharge Diagnoses:  Principal Problem:   Acute encephalopathy Active Problems:   CAD (coronary artery disease)   Atrial fibrillation, chronic   Diabetes mellitus type 2 with peripheral artery disease (HCC)   Hyperlipidemia   Acute on chronic congestive heart failure (HCC)   Essential hypertension   Type II diabetes mellitus with renal manifestations (HCC)   Severe aortic stenosis   S/P TAVR (transcatheter aortic valve replacement)   CKD (chronic kidney disease) stage 4, GFR 15-29 ml/min (HCC)   Acute on chronic  systolic CHF (congestive heart failure) (HCC)   CVA (cerebral vascular accident) ([place])   Right sided weakness   HCAP (healthcare-associated pneumonia)   Aspiration pneumonia ([place])   Acute renal failure superimposed on stage 4 chronic kidney disease ([place])     1 acute encephalopathy-multifactorial secondary to pneumonia versus recurrent stroke. Patient has made significant improvement since 2 days ago. Today she is much awake and able to answer yes and no by nodding her head.  2. Probable healthcare pneumonia versus aspiration pneumonia  3.??Acute on chronic systolic heart failure Chest x-ray concerning for increasing vascular congestion. Patient presented with increased O2 requirements and shortness of breath.  4. Acute on chronic renal failure stage IV creatinine trending up in spite of slow IV hydration  5. Probable progressive CVAlikely embolic -noted on CT head. Per last discharge summary from 05/11/2018 neurology assess patient during that hospitalization recommended no further stroke work-up and patient to follow-up with neurology in the outpatient setting.    Plan patient was seen by palliative care.  Patient is chronic complex medical problems which was not resolved during this hospital stay.  Upon discussion with family decision was made to discharge patient home with hospice.  It was patient's desire to go home and passed away at home peacefully.  Medically this seems to be the right thing to do for this patient since she does not have any reversible illness at this time.  Patient will be discharged home with hospice.  Nutrition Problem: Inadequate oral intake Etiology: dysphagia    Signs/Symptoms: meal completion < 25%     Interventions: Boost Breeze  Estimated body mass index is 32.74 kg/m as calculated from the following:   Height as  of this encounter: '5\' 6"'$  (1.676 m).   Weight as of this encounter: 92 kg.  Discharge Instructions  Discharge  Instructions    Diet - low sodium heart healthy   Complete by:  As directed    Increase activity slowly   Complete by:  As directed      Allergies as of 05/19/2018      Reactions   Morphine And Related Nausea And Vomiting, Other (See Comments)   Chest pain, also   Lisinopril Cough   Zoloft [sertraline Hcl] Nausea Only         Medication List    STOP taking these medications   apixaban 2.5 MG Tabs tablet Commonly known as:  ELIQUIS   atorvastatin 80 MG tablet Commonly known as:  LIPITOR   collagenase ointment Commonly known as:  SANTYL   feeding supplement (PRO-STAT SUGAR FREE 64) Liqd   ferrous sulfate 325 (65 FE) MG tablet   linagliptin 5 MG Tabs tablet Commonly known as:  TRADJENTA   multivitamin with minerals Tabs tablet   nitroGLYCERIN 0.4 MG SL tablet Commonly known as:  NITROSTAT   pantoprazole 40 MG tablet Commonly known as:  PROTONIX   predniSONE 20 MG tablet Commonly known as:  DELTASONE   senna 8.6 MG Tabs tablet Commonly known as:  SENOKOT     TAKE these medications   acetaminophen 325 MG tablet Commonly known as:  TYLENOL Take 2 tablets (650 mg total) by mouth every 6 (six) hours as needed for mild pain, moderate pain or headache.   amLODipine 10 MG tablet Commonly known as:  NORVASC Take 10 mg by mouth daily.   furosemide 40 MG tablet Commonly known as:  LASIX Take 1 tablet (40 mg total) by mouth daily.   insulin aspart 100 UNIT/ML injection Commonly known as:  novoLOG Inject 0-15 Units into the skin 3 (three) times daily with meals. CBG < 70: implement hypoglycemia protocol CBG 70 - 120: 0 units CBG 121 - 150: 2 units CBG 151 - 200: 3 units CBG 201 - 250: 5 units CBG 251 - 300: 8 units CBG 301 - 350: 11 units CBG 351 - 400: 15 units CBG > 400: call MD. What changed:  additional instructions   insulin NPH Human 100 UNIT/ML injection Commonly known as:  HUMULIN N,NOVOLIN N Inject 0.1 mLs (10 Units total) into the skin at  bedtime. What changed:  Another medication with the same name was removed. Continue taking this medication, and follow the directions you see here.   metoprolol tartrate 25 MG tablet Commonly known as:  LOPRESSOR Take 1 tablet (25 mg total) by mouth 2 (two) times daily.       Allergies  Allergen Reactions  . Morphine And Related Nausea And Vomiting and Other (See Comments)    Chest pain, also   . Lisinopril Cough  . Zoloft [Sertraline Hcl] Nausea Only         Consultations: Palliative care  Procedures/Studies: Dg Chest 1 View  Result Date: 05/17/2018 CLINICAL DATA:  Hypoxia EXAM: CHEST  1 VIEW COMPARISON:  Portable exam 1332 hours compared to 05/15/2018 FINDINGS: Enlargement of cardiac silhouette post CABG and TAVR. Pulmonary vascular congestion. Atherosclerotic calcifications aorta. Diffuse pulmonary edema, slightly improved in RIGHT upper lobe. RIGHT pleural effusion and basilar atelectasis. No pneumothorax. Bones demineralized. IMPRESSION: Pulmonary edema slightly improved in LEFT lung with persistent RIGHT pleural effusion and basilar atelectasis Electronically Signed   By: Lavonia Dana M.D.   On: 05/17/2018 15:42  Dg Chest 1 View  Result Date: 05/15/2018 CLINICAL DATA:  Shortness of Breath EXAM: CHEST  1 VIEW COMPARISON:  04/10/2018 FINDINGS: Cardiac shadow is mildly enlarged. Changes of coronary bypass grafting and prior TAVR are noted. Increased central vascular congestion is noted with patchy infiltrate most prominent within the midportion of the right lung. Small right-sided pleural effusion is noted. No acute bony abnormality is seen. Chronic changes in the proximal right humerus are noted. IMPRESSION: Vascular congestion increased from the prior exam. Patchy infiltrative opacities are noted bilaterally worst in the midportion of the right lung. Electronically Signed   By: Inez Catalina M.D.   On: 05/15/2018 15:51   Ct Head Wo Contrast  Result Date: 05/15/2018 CLINICAL  DATA:  Acute altered level of consciousness. Stroke 1 week ago. The EXAM: CT HEAD WITHOUT CONTRAST TECHNIQUE: Contiguous axial images were obtained from the base of the skull through the vertex without intravenous contrast. COMPARISON:  CT head without contrast 05/08/2018. MRI brain without contrast 05/08/2018 FINDINGS: Brain: A progressive medial left frontal lobe infarct is present. This is just anterior and medial to the areas of restricted diffusion on the most recent MRI scan. More remote encephalomalacia is present inferiorly in the left frontal lobe. The subacute posterior left frontal lobe infarct is also noted with some suggestion of cortical laminar necrosis. White matter changes are present on the right without cortical infarct. Basal ganglia are intact. The brainstem and cerebellum are within normal limits. Vascular: Dense atherosclerotic calcifications are present within the cavernous internal carotid arteries bilaterally and at the dural margin of both vertebral arteries. No hyperdense vessel is present. Skull: Calvarium is intact. No focal lytic or blastic lesions are present. Sinuses/Orbits: The paranasal sinuses and mastoid air cells are clear. Bilateral lens replacements are present. Globes and orbits are within normal limits. IMPRESSION: 1. Progressive medial anterior left frontal lobe infarct, likely within the ACA territory. 2. Subacute posterior left frontal lobe infarct. 3. Stable remote left frontal lobe encephalomalacia. 4. Atherosclerosis. Electronically Signed   By: San Morelle M.D.   On: 05/15/2018 16:05   Ct Head Wo Contrast  Result Date: 05/08/2018 CLINICAL DATA:  83 year old female with weakness and decreased mobility. EXAM: CT HEAD WITHOUT CONTRAST TECHNIQUE: Contiguous axial images were obtained from the base of the skull through the vertex without intravenous contrast. COMPARISON:  Recent prior CT scan of the head 04/18/2018 FINDINGS: Brain: Similar appearance of left  MCA territory encephalomalacia consistent with a remote infarct. Multiple additional punctate cortical infarcts visualized on the prior MRI are difficult to perceive by CT imaging. No definite new acute infarct, ischemia. No evidence of intracranial hemorrhage, mass lesion, mass effect or hydrocephalus. Stable atrophy and moderate chronic microvascular ischemic white matter disease. Vascular: No hyperdense vessel or unexpected calcification. Atherosclerotic calcifications in the bilateral cavernous and supraclinoid internal carotid arteries. Skull: Normal. Negative for fracture or focal lesion. Sinuses/Orbits: No acute finding. Other: None. IMPRESSION: 1. No acute intracranial abnormality. 2. Stable appearance of remote left MCA territory infarct, mild cortical atrophy and moderate chronic microvascular ischemic white matter disease. Electronically Signed   By: Jacqulynn Cadet M.D.   On: 05/08/2018 15:57   Mr Brain Wo Contrast  Result Date: 05/08/2018 CLINICAL DATA:  Focal neurologic deficit with worsening right-sided weakness. EXAM: MRI HEAD WITHOUT CONTRAST TECHNIQUE: Multiplanar, multiecho pulse sequences of the brain and surrounding structures were obtained without intravenous contrast. COMPARISON:  Head CT 05/08/2018 Brain MRI 04/18/2018 FINDINGS: BRAIN: There is a small amount of diffusion restriction  along the left postcentral gyrus and the superior frontal gyrus. Distribution has changed slightly from the prior study with new sites compared to 04/18/2018. The midline structures are normal. No midline shift or other mass effect. There is encephalomalacia at the site of an old left frontal infarct. Multifocal white matter hyperintensity, most commonly due to chronic ischemic microangiopathy. The cerebral and cerebellar volume are age-appropriate. Mild hemosiderin deposition over the site of the old left frontal infarct. VASCULAR: Major intracranial arterial and venous sinus flow voids are normal. SKULL  AND UPPER CERVICAL SPINE: Calvarial bone marrow signal is normal. There is no skull base mass. Visualized upper cervical spine and soft tissues are normal. SINUSES/ORBITS: No fluid levels or advanced mucosal thickening. No mastoid or middle ear effusion. The orbits are normal. IMPRESSION: 1. Multiple new foci of acute ischemia within the left hemisphere, in close proximity to the previously demonstrated lesions. 2. No hemorrhage or mass effect. 3. Old left frontal lobe infarct and chronic ischemic microangiopathy. Electronically Signed   By: Ulyses Jarred M.D.   On: 05/08/2018 22:48    (Echo, Carotid, EGD, Colonoscopy, ERCP)    Subjective:   Discharge Exam: Vitals:   05/19/18 0511 05/19/18 0812  BP: 135/75   Pulse: 66   Resp: 18   Temp: (!) 97.5 F (36.4 C)   SpO2: 97% 98%   Vitals:   05/18/18 2009 05/18/18 2058 05/19/18 0511 05/19/18 0812  BP:  117/62 135/75   Pulse:  69 66   Resp:  20 18   Temp:  97.7 F (36.5 C) (!) 97.5 F (36.4 C)   TempSrc:  Oral Oral   SpO2: 93% 93% 97% 98%  Weight:      Height:        General: Pt is  awake, not in acute distress Cardiovascular: RRR, S1/S2 +, no rubs, no gallops Respiratory: Rhonchi bilaterally, no wheezing, no rhonchi Abdominal: Soft, NT, ND, bowel sounds + Extremities: 1+ edema bilaterally and right upper extremity   The results of significant diagnostics from this hospitalization (including imaging, microbiology, ancillary and laboratory) are listed below for reference.     Microbiology: Recent Results (from the past 240 hour(s))  Blood culture (routine x 2)     Status: None (Preliminary result)   Collection Time: 05/15/18  2:50 PM  Result Value Ref Range Status   Specimen Description   Final    BLOOD LEFT WRIST Performed at [facility] [address], [place], [place] [address]    Special Requests   Final    BOTTLES DRAWN AEROBIC AND ANAEROBIC Blood Culture adequate volume Performed at [facility] [address], [place], [place] [address]    Culture   Final    NO GROWTH 4 DAYS Performed at [facility], [place] [address] [place], [place] [address]    Report Status PENDING  Incomplete  Blood culture (routine x 2)     Status: None (Preliminary result)   Collection Time: 05/15/18  2:50 PM  Result Value Ref Range Status   Specimen Description   Final    BLOOD LEFT ANTECUBITAL Performed at [facility] [address] [place], [place] [address]    Special Requests   Final    BOTTLES DRAWN AEROBIC AND ANAEROBIC Blood Culture results may not be optimal due to an excessive volume of blood received in culture bottles Performed at [place] [address] [place], [place] [address]  Culture   Final    NO GROWTH 4 DAYS Performed at [facility], [place] [address] [place], [place] [address]    Report Status PENDING  Incomplete  Culture, Urine     Status: Abnormal   Collection Time: 05/15/18  5:30 PM  Result Value Ref Range Status   Specimen Description   Final    URINE, CLEAN CATCH Performed at [facility], [place] [address], [place], [place] [address]    Special Requests   Final    NONE Performed at [facility], [place] [address], [place], [place] [address]    Culture >=100,000 COLONIES/mL ENTEROCOCCUS FAECALIS (A)  Final   Report Status 05/18/2018 FINAL  Final   Organism ID, Bacteria ENTEROCOCCUS FAECALIS (A)  Final      Susceptibility   Enterococcus faecalis - MIC*    AMPICILLIN <=2 SENSITIVE Sensitive     LEVOFLOXACIN 2 SENSITIVE Sensitive     NITROFURANTOIN 64 INTERMEDIATE Intermediate     VANCOMYCIN 1 SENSITIVE Sensitive     * >=100,000 COLONIES/mL ENTEROCOCCUS FAECALIS  MRSA PCR Screening     Status: Abnormal   Collection Time: 05/16/18  8:57 AM  Result Value Ref Range Status   MRSA by PCR POSITIVE (A) NEGATIVE Final    Comment:        The GeneXpert MRSA  Assay (FDA approved for NASAL specimens only), is one component of a comprehensive MRSA colonization surveillance program. It is not intended to diagnose MRSA infection nor to guide or monitor treatment for MRSA infections. RESULT CALLED TO, READ BACK BY AND VERIFIED WITH: Richelle Ito [id number] @ [address] Performed at [facility] [address], [place], [place] [address]   Culture, blood (routine x 2)     Status: None (Preliminary result)   Collection Time: 05/18/18  5:02 PM  Result Value Ref Range Status   Specimen Description   Final    BLOOD RIGHT HAND Performed at [facility] [address], [place], [place] [address]    Special Requests   Final    BOTTLES DRAWN AEROBIC ONLY Blood Culture results may not be optimal due to an inadequate volume of blood received in culture bottles Performed at [facility] [address], [place], [place] [address]    Culture   Final    NO GROWTH < 24 HOURS Performed at [facility] [address], [place], [place] [address]    Report Status PENDING  Incomplete  Culture, blood (routine x 2)     Status: None (Preliminary result)   Collection Time: 05/18/18  5:02 PM  Result Value Ref Range Status   Specimen Description   Final    BLOOD LEFT HAND Performed at [facility] [address] [place], [place] [address]    Special Requests   Final    BOTTLES DRAWN AEROBIC ONLY Blood Culture adequate volume Performed at [facility] [address] [place], [place] [address]    Culture   Final    NO GROWTH < 24 HOURS Performed at [facility] [address], [place],  [address]    Report Status PENDING  Incomplete     Labs: BNP (last 3 results) Recent Labs    03/13/18 1110 03/20/18 1721 04/10/18 1147  BNP 2,601.1* 1,119.8* 9,449.6*   Basic Metabolic Panel: Recent Labs  Lab 05/15/18 1450  05/15/18 1928 05/16/18 0508 05/17/18  1914 05/18/18 1702 05/19/18 0343  NA 134*  --  134* 137 133*  --   K 4.6  --  4.8 4.3 4.1  --   CL 105  --  104 102 95*  --   CO2 19*  --  15* 22 24  --   GLUCOSE 217*  --  252* 241* 437*  --   BUN 98*  --  104* 110* 109*  --   CREATININE 2.04*  --  2.32* 2.40* 2.40* 2.16*  CALCIUM 7.7*  --  7.8* 7.6* 7.4*  --   MG  --  2.6*  --   --   --   --    Liver Function Tests: Recent Labs  Lab 05/15/18 1450 05/18/18 1702  AST 43* 47*  ALT 38 43  ALKPHOS 142* 136*  BILITOT 2.9* 2.4*  PROT 6.0* 6.4*  ALBUMIN 2.3* 2.4*   No results for input(s): LIPASE, AMYLASE in the last 168 hours. No results for input(s): AMMONIA in the last 168 hours. CBC: Recent Labs  Lab 05/15/18 1450 05/16/18 0508 05/17/18 0914 05/18/18 1702 05/19/18 0343  WBC 12.4* 10.5 16.1* 15.6* 13.1*  NEUTROABS 12.0*  --  15.0*  --   --   HGB 8.3* 8.3* 7.7* 7.8* 7.6*  HCT 27.8* 28.7* 26.7* 26.6* 25.9*  MCV 93.0 93.2 91.8 92.7 91.8  PLT 120* 132* 133* 103* 78*   Cardiac Enzymes: No results for input(s): CKTOTAL, CKMB, CKMBINDEX, TROPONINI in the last 168 hours. BNP: Invalid input(s): POCBNP CBG: Recent Labs  Lab 05/18/18 1440 05/18/18 1536 05/18/18 1816 05/19/18 0002 05/19/18 0539  GLUCAP 426* 457* 430* 249* 199*   D-Dimer No results for input(s): DDIMER in the last 72 hours. Hgb A1c No results for input(s): HGBA1C in the last 72 hours. Lipid Profile No results for input(s): CHOL, HDL, LDLCALC, TRIG, CHOLHDL, LDLDIRECT in the last 72 hours. Thyroid function studies No results for input(s): TSH, T4TOTAL, T3FREE, THYROIDAB in the last 72 hours.  Invalid input(s): FREET3 Anemia work up No results for input(s): VITAMINB12, FOLATE, FERRITIN, TIBC, IRON, RETICCTPCT in the last 72 hours. Urinalysis    Component Value Date/Time   COLORURINE AMBER (A) 05/15/2018 1447   APPEARANCEUR CLOUDY (A) 05/15/2018 1447   LABSPEC 1.016 05/15/2018 1447   PHURINE 5.0 05/15/2018  1447   GLUCOSEU NEGATIVE 05/15/2018 1447   HGBUR MODERATE (A) 05/15/2018 1447   BILIRUBINUR NEGATIVE 05/15/2018 1447   KETONESUR NEGATIVE 05/15/2018 1447   PROTEINUR >=300 (A) 05/15/2018 1447   UROBILINOGEN 1.0 05/29/2014 1052   NITRITE NEGATIVE 05/15/2018 1447   LEUKOCYTESUR TRACE (A) 05/15/2018 1447   Sepsis Labs Invalid input(s): PROCALCITONIN,  WBC,  LACTICIDVEN Microbiology Recent Results (from the past 240 hour(s))  Blood culture (routine x 2)     Status: None (Preliminary result)   Collection Time: 05/15/18  2:50 PM  Result Value Ref Range Status   Specimen Description   Final    BLOOD LEFT WRIST Performed at [facility] [address] [place], [place] [address]    Special Requests   Final    BOTTLES DRAWN AEROBIC AND ANAEROBIC Blood Culture adequate volume Performed at [facility] [address] [place], [place] [address]    Culture   Final    NO GROWTH 4 DAYS Performed at [facility], [place] [address], [place], [place] [address]    Report Status PENDING  Incomplete  Blood culture (routine x 2)     Status:  None (Preliminary result)   Collection Time: 05/15/18  2:50 PM  Result Value Ref Range Status   Specimen Description   Final    BLOOD LEFT ANTECUBITAL Performed at [facility] [address], [place], [place] [address]    Special Requests   Final    BOTTLES DRAWN AEROBIC AND ANAEROBIC Blood Culture results may not be optimal due to an excessive volume of blood received in culture bottles Performed at [facility] [address] [place], [place] [address]    Culture   Final    NO GROWTH 4 DAYS Performed at [facility], [place] [address], [place], [place] [address]    Report Status PENDING  Incomplete  Culture, Urine     Status: Abnormal   Collection Time: 05/15/18  5:30 PM  Result Value Ref Range Status   Specimen Description   Final    URINE, CLEAN  CATCH Performed at [facility], [place] [address] [place], [place] [address]    Special Requests   Final    NONE Performed at [facility], [place] [address] [place], [place] [address]    Culture >=100,000 COLONIES/mL ENTEROCOCCUS FAECALIS (A)  Final   Report Status 05/18/2018 FINAL  Final   Organism ID, Bacteria ENTEROCOCCUS FAECALIS (A)  Final      Susceptibility   Enterococcus faecalis - MIC*    AMPICILLIN <=2 SENSITIVE Sensitive     LEVOFLOXACIN 2 SENSITIVE Sensitive     NITROFURANTOIN 64 INTERMEDIATE Intermediate     VANCOMYCIN 1 SENSITIVE Sensitive     * >=100,000 COLONIES/mL ENTEROCOCCUS FAECALIS  MRSA PCR Screening     Status: Abnormal   Collection Time: 05/16/18  8:57 AM  Result Value Ref Range Status   MRSA by PCR POSITIVE (A) NEGATIVE Final    Comment:        The GeneXpert MRSA Assay (FDA approved for NASAL specimens only), is one component of a comprehensive MRSA colonization surveillance program. It is not intended to diagnose MRSA infection nor to guide or monitor treatment for MRSA infections. RESULT CALLED TO, READ BACK BY AND VERIFIED WITH: Richelle Ito [id number] @ [address] Performed at [facility] [address] [place], [place] [address]   Culture, blood (routine x 2)     Status: None (Preliminary result)   Collection Time: 05/18/18  5:02 PM  Result Value Ref Range Status   Specimen Description   Final    BLOOD RIGHT HAND Performed at [facility] [address] [place], [place] [address]    Special Requests   Final    BOTTLES DRAWN AEROBIC ONLY Blood Culture results may not be optimal due to an inadequate volume of blood received in culture bottles Performed at [facility] [address] [place], [place] [address]    Culture   Final    NO GROWTH < 24 HOURS Performed at [facility] [address], [place], [place] [address]     Report Status PENDING  Incomplete  Culture, blood (routine x 2)     Status: None (Preliminary result)   Collection Time: 05/18/18  5:02 PM  Result Value Ref Range Status   Specimen Description   Final    BLOOD LEFT HAND Performed at [facility] [address], [place], [place] [address]    Special Requests   Final    BOTTLES DRAWN AEROBIC ONLY Blood  Culture adequate volume Performed at [facility] [address], [place], [place] [address]    Culture   Final    NO GROWTH < 24 HOURS Performed at [facility] [address], [place], [place] [address]    Report Status PENDING  Incomplete     Time coordinating discharge: 34 minutes  SIGNED:   Georgette Shell, MD  [facility] 05/19/2018, 11:15 AM Pager   If 7PM-7AM, please contact night-coverage [email or web address] Password [email or web address]

## 2018-05-19 NOTE — Progress Notes (Signed)
OT Cancellation Note  Patient Details Name: Kristina Summers MRN: [id number] DOB: May 05, 1935   Cancelled Treatment:    Reason Eval/Treat Not Completed: Other (comment).  Pt is now comfort care. Will sign off.  Shaelynn Dragos 05/19/2018, 7:57 AM  Lesle Chris, OTR/L Acute Rehabilitation Services [phone] [facility] pager [phone] office 05/19/2018

## 2018-05-20 LAB — CULTURE, BLOOD (ROUTINE X 2)
Culture: NO GROWTH
Culture: NO GROWTH
Special Requests: ADEQUATE

## 2018-05-21 LAB — URINE CULTURE: Culture: >=100000 — AB

## 2018-05-22 ENCOUNTER — Ambulatory Visit (HOSPITAL_COMMUNITY): Payer: Medicare Other

## 2018-05-23 LAB — CULTURE, BLOOD (ROUTINE X 2)
Culture: NO GROWTH
Culture: NO GROWTH
Special Requests: ADEQUATE

## 2018-05-24 ENCOUNTER — Ambulatory Visit (HOSPITAL_COMMUNITY): Payer: Medicare Other

## 2018-05-25 ENCOUNTER — Inpatient Hospital Stay: Payer: Medicare Other | Admitting: Neurology

## 2018-05-26 ENCOUNTER — Ambulatory Visit (HOSPITAL_COMMUNITY): Payer: Medicare Other

## 2018-05-29 ENCOUNTER — Ambulatory Visit (HOSPITAL_COMMUNITY): Payer: Medicare Other

## 2018-05-31 ENCOUNTER — Ambulatory Visit (HOSPITAL_COMMUNITY): Payer: Medicare Other

## 2018-06-02 ENCOUNTER — Ambulatory Visit (HOSPITAL_COMMUNITY): Payer: Medicare Other

## 2018-06-05 ENCOUNTER — Ambulatory Visit (HOSPITAL_COMMUNITY): Payer: Medicare Other

## 2018-06-07 ENCOUNTER — Ambulatory Visit (HOSPITAL_COMMUNITY): Payer: Medicare Other

## 2018-06-09 ENCOUNTER — Ambulatory Visit (HOSPITAL_COMMUNITY): Payer: Medicare Other

## 2018-06-12 ENCOUNTER — Ambulatory Visit (HOSPITAL_COMMUNITY): Payer: Medicare Other

## 2018-06-14 ENCOUNTER — Ambulatory Visit (HOSPITAL_COMMUNITY): Payer: Medicare Other

## 2018-06-16 ENCOUNTER — Ambulatory Visit (HOSPITAL_COMMUNITY): Payer: Medicare Other

## 2018-06-18 DEATH — deceased

## 2018-06-19 ENCOUNTER — Ambulatory Visit (HOSPITAL_COMMUNITY): Payer: Medicare Other

## 2018-06-21 ENCOUNTER — Ambulatory Visit (HOSPITAL_COMMUNITY): Payer: Medicare Other

## 2018-06-23 ENCOUNTER — Ambulatory Visit (HOSPITAL_COMMUNITY): Payer: Medicare Other

## 2018-06-26 ENCOUNTER — Ambulatory Visit (HOSPITAL_COMMUNITY): Payer: Medicare Other

## 2018-06-28 ENCOUNTER — Ambulatory Visit (HOSPITAL_COMMUNITY): Payer: Medicare Other

## 2018-08-25 ENCOUNTER — Telehealth: Payer: Self-pay | Admitting: Physician Assistant

## 2018-08-25 NOTE — Telephone Encounter (Signed)
°  Per daughter Dellie Catholic, patient expired  February 2020. Message sent to HIM

## 2018-09-05 ENCOUNTER — Encounter (HOSPITAL_COMMUNITY): Payer: Medicare Other

## 2018-09-05 ENCOUNTER — Ambulatory Visit: Payer: Medicare Other | Admitting: Family

## 2019-04-27 IMAGING — MR MR HEAD W/O CM
8 of 10 series · 36 of 48 positions shown · non-contrast
Comparison: CTA CT perfusion 1016 hours today. Brain MRI
05/30/2014.

CLINICAL DATA: 82-year-old female code stroke. Left MCA M3 branch
occlusion on CTA / CTP. earlier today. Atrial fibrillation on
Eliquis, not a candidate for IV tPA.

EXAM:
MRI HEAD WITHOUT CONTRAST
TECHNIQUE: Multiplanar, multiecho pulse sequences of the brain and surrounding
structures were obtained without intravenous contrast.

[Series 3: DWI · axial · 3.0mm · 1.09mm/px · z∈[-84,+50]mm · 9 of 92 slices shown (1 of 4)]
[im 1/92]
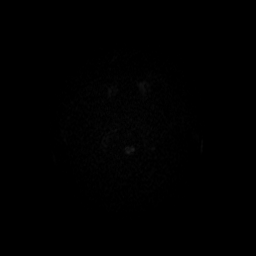
[im 12/92]
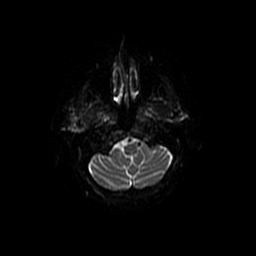
[im 23/92]
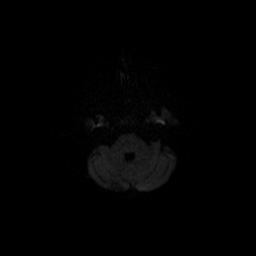
[im 35/92]
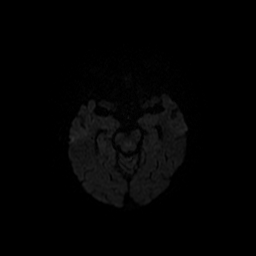
[im 46/92]
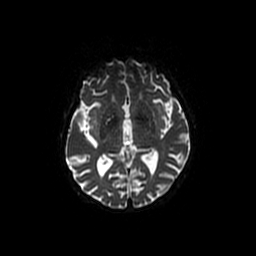
[im 57/92]
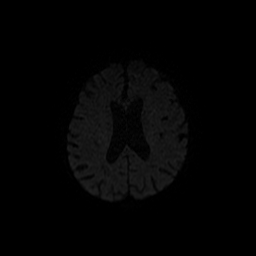
[im 69/92]
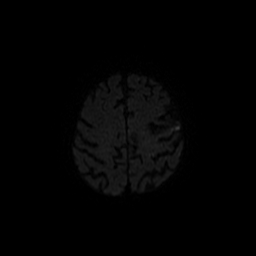
[im 80/92]
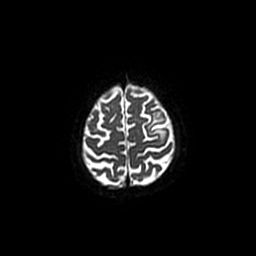
[im 92/92]
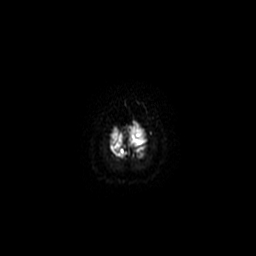

[Series 4: DWI · coronal · 5.0mm · 1.09mm/px · 7 of 68 slices shown (2 of 4)]
[im 1/68]
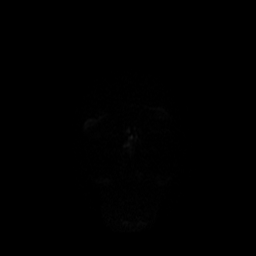
[im 12/68]
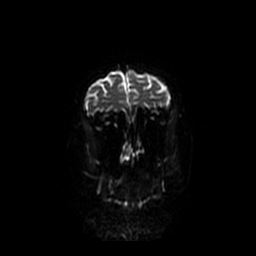
[im 23/68]
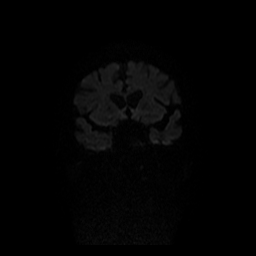
[im 34/68]
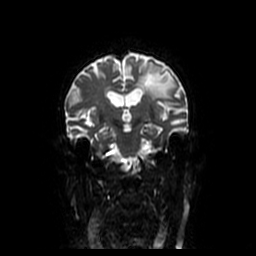
[im 45/68]
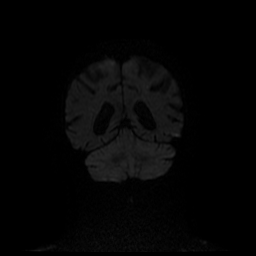
[im 56/68]
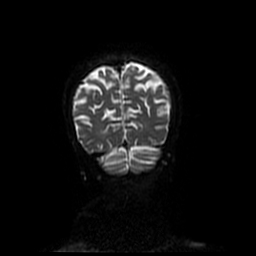
[im 68/68]
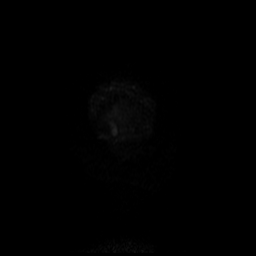

[Series 5: T1 · sagittal · 5.0mm · 0.47mm/px · 2 of 23 slices shown]
[im 1/23]
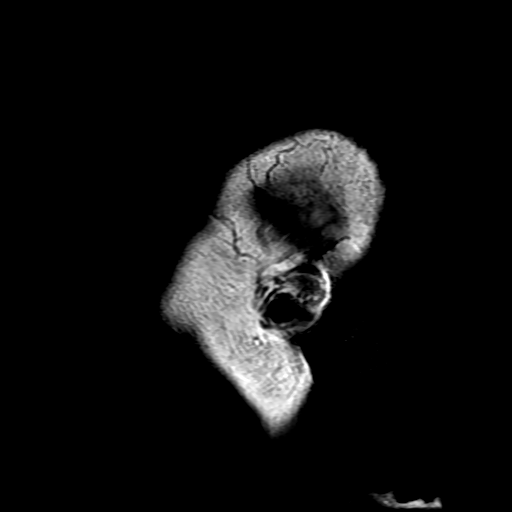
[im 23/23]
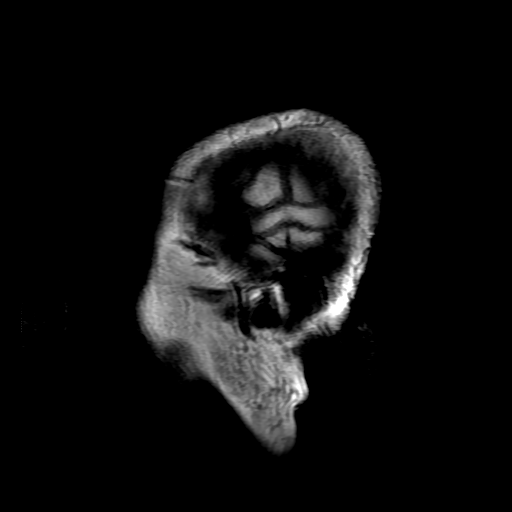

[Series 6: FLAIR · axial · 3.0mm · 0.43mm/px · z∈[-74,+63]mm · 3 of 24 slices shown]
[im 1/24]
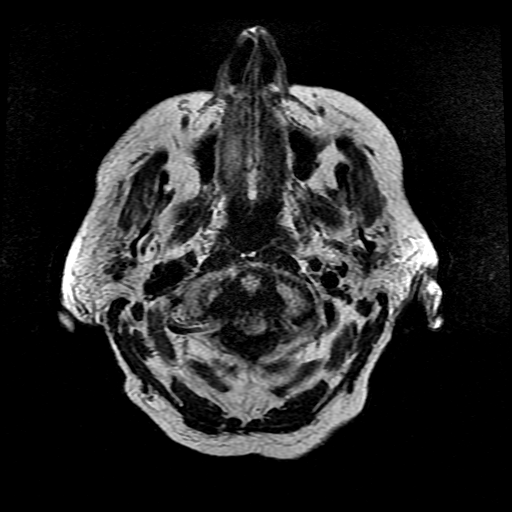
[im 12/24]
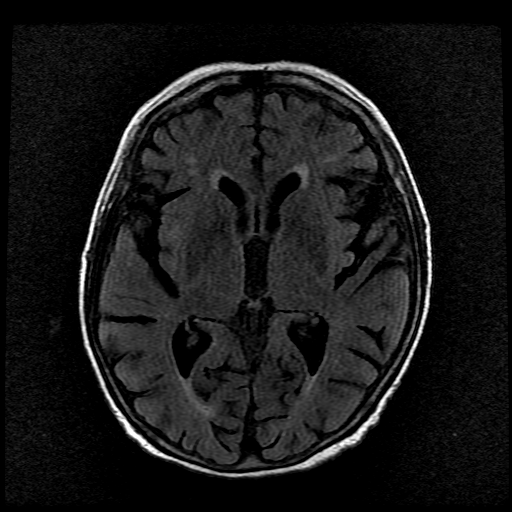
[im 24/24]
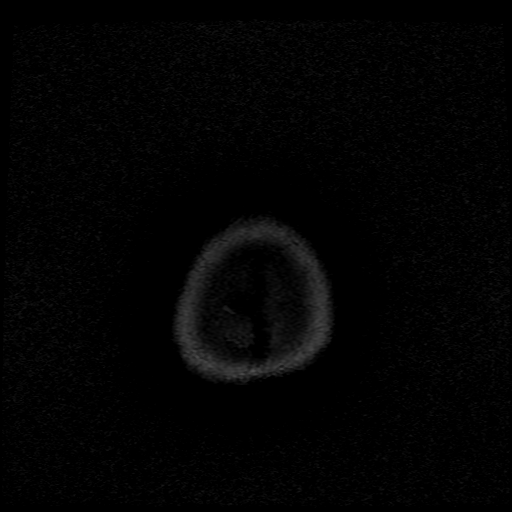

[Series 7: T2 · axial · 5.0mm · 0.43mm/px · z∈[-74,+63]mm · 3 of 24 slices shown (1 of 2)]
[im 1/24]
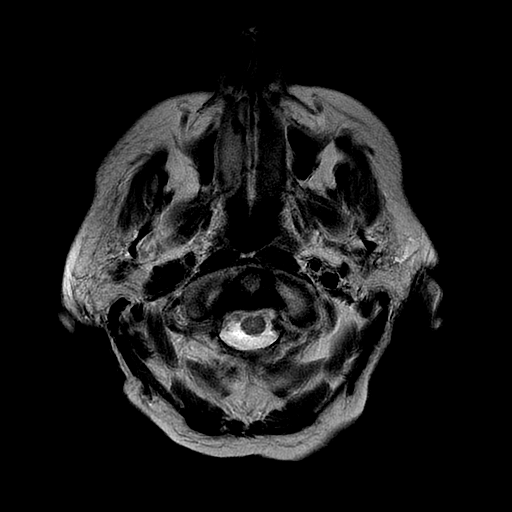
[im 12/24]
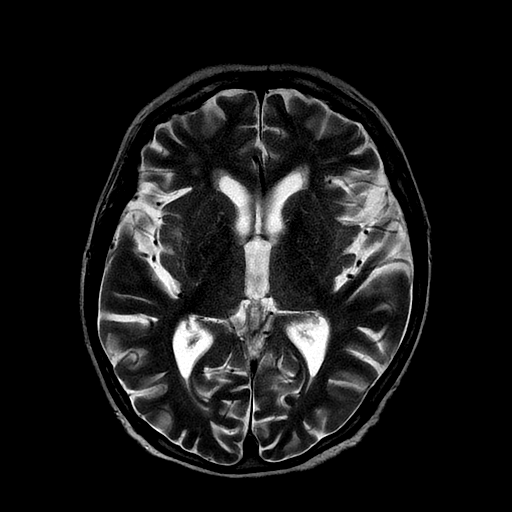
[im 24/24]
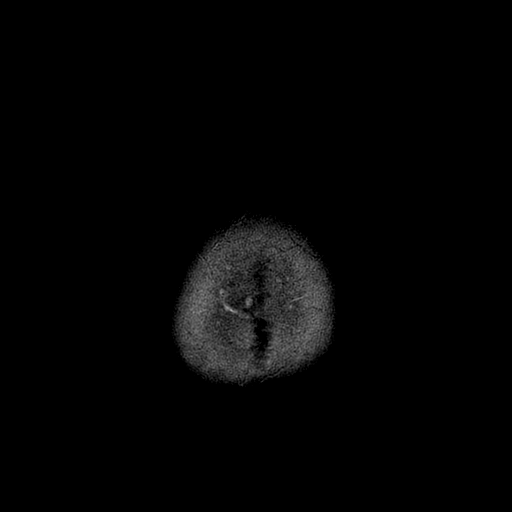

[Series 9: T2 · coronal · 5.0mm · 0.39mm/px · 3 of 26 slices shown (2 of 2)]
[im 1/26]
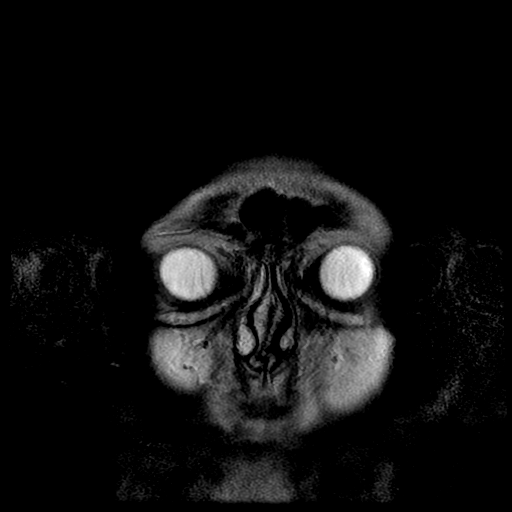
[im 13/26]
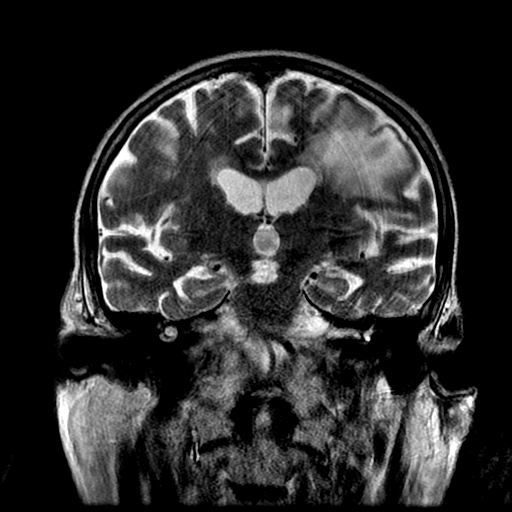
[im 26/26]
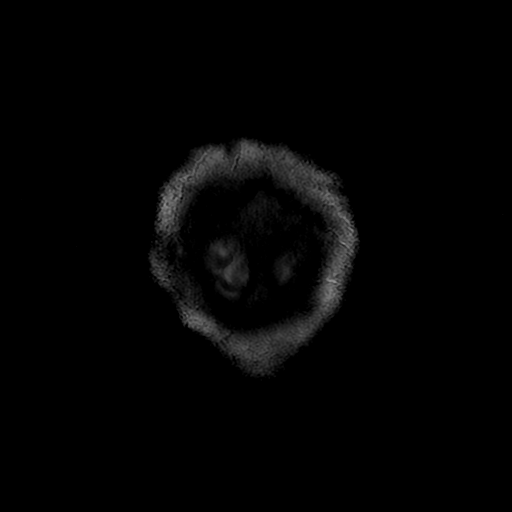

[Series 300: DWI · axial · 3.0mm · 1.09mm/px · z∈[-84,+47]mm · 5 of 45 slices shown (3 of 4)]
[im 1/45]
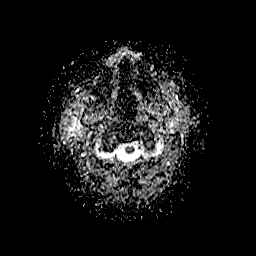
[im 12/45]
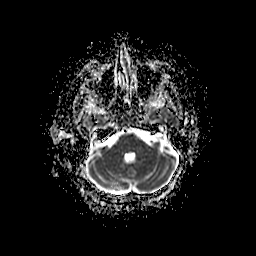
[im 23/45]
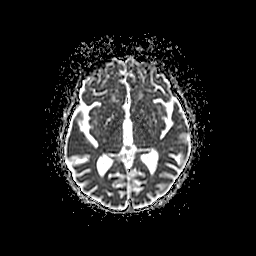
[im 34/45]
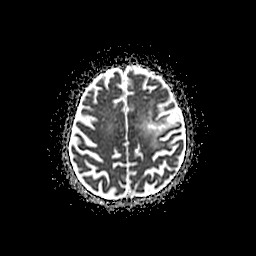
[im 45/45]
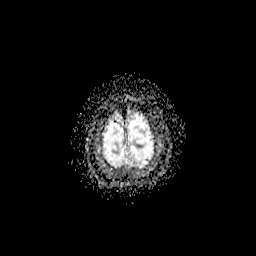

[Series 400: DWI · coronal · 5.0mm · 1.09mm/px · 4 of 33 slices shown (4 of 4)]
[im 1/33]
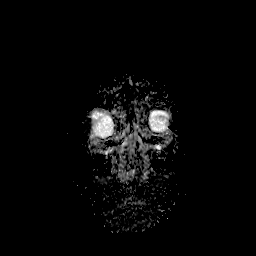
[im 11/33]
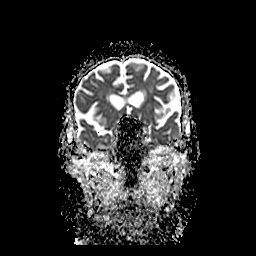
[im 22/33]
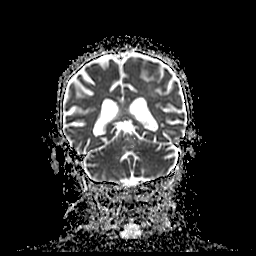
[im 33/33]
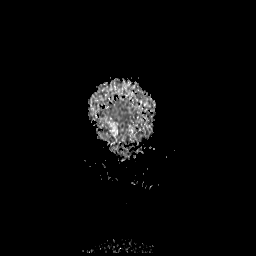

[36 of 48 positions shown; findings below may reference images not displayed]

FINDINGS: Brain: Scattered small cortical infarcts in the left MCA territory
primarily along the left middle frontal gyrus along the posterior
and superior margins of chronic encephalomalacia which is related to
the 2192 infarct. There also is a small area of cortical infarction
along the posterior left insula (series 3, image 24) and left
inferior frontal operculum (series 3, image 26).

Faint T2 and FLAIR hyperintensity in the affected parenchyma with no
hemorrhage or mass effect. Superimposed small area of left parietal
lobe and medial motor strip chronic cortical encephalomalacia on
series 6, image 19.

No contralateral right hemisphere or posterior circulation
restricted diffusion. Nomidline shift, mass effect, evidence of mass
lesion, ventriculomegaly, extra-axial collection or acute
intracranial hemorrhage. Cervicomedullary junction and pituitary are
within normal limits.

Elsewhere in the brain gray and white matter signal has not
significantly changed since 2192. The deep gray matter nuclei,
brainstem, and cerebellum remain normal for age. No chronic cerebral
blood products identified.

Vascular: Major intracranial vascular flow voids are stable since
2192.

Skull and upper cervical spine: Negative for age visible cervical
spine. Normal bone marrow signal.

Sinuses/Orbits: Stable and negative.

Other: Mastoids remain clear. Visible internal auditory structures
appear normal. Scalp and face soft tissues are within normal limits.
IMPRESSION: 1. Multiple small cortical infarcts scattered in the Left MCA
territory from the posterior insula to the left middle frontal gyrus
are compatible with the sequelae of the Left M3 branch occlusion
demonstrated earlier today.
2. No associated hemorrhage or mass effect. No other acute vascular
territory ischemia.
3. Superimposed chronic encephalomalacia in the left MCA territory.

## 2022-01-14 NOTE — Telephone Encounter (Signed)
Opened in error
